# Patient Record
Sex: Male | Born: 1955
Health system: Southern US, Community
[De-identification: ages and names within clinical notes are randomized; demographics above are authoritative.]

## PROBLEM LIST (undated history)

## (undated) DIAGNOSIS — I10 Essential (primary) hypertension: Secondary | ICD-10-CM

## (undated) DIAGNOSIS — C188 Malignant neoplasm of overlapping sites of colon: Secondary | ICD-10-CM

## (undated) DIAGNOSIS — C184 Malignant neoplasm of transverse colon: Secondary | ICD-10-CM

## (undated) DIAGNOSIS — F1010 Alcohol abuse, uncomplicated: Secondary | ICD-10-CM

## (undated) DIAGNOSIS — F32A Depression, unspecified: Secondary | ICD-10-CM

## (undated) DIAGNOSIS — F329 Major depressive disorder, single episode, unspecified: Secondary | ICD-10-CM

## (undated) DIAGNOSIS — C772 Secondary and unspecified malignant neoplasm of intra-abdominal lymph nodes: Principal | ICD-10-CM

## (undated) DIAGNOSIS — R911 Solitary pulmonary nodule: Secondary | ICD-10-CM

## (undated) DIAGNOSIS — I609 Nontraumatic subarachnoid hemorrhage, unspecified: Secondary | ICD-10-CM

## (undated) DIAGNOSIS — J449 Chronic obstructive pulmonary disease, unspecified: Secondary | ICD-10-CM

## (undated) DIAGNOSIS — K219 Gastro-esophageal reflux disease without esophagitis: Secondary | ICD-10-CM

## (undated) DIAGNOSIS — S065X9A Traumatic subdural hemorrhage with loss of consciousness of unspecified duration, initial encounter: Secondary | ICD-10-CM

## (undated) DIAGNOSIS — R9439 Abnormal result of other cardiovascular function study: Secondary | ICD-10-CM

## (undated) DIAGNOSIS — K4041 Unilateral inguinal hernia, with gangrene, recurrent: Secondary | ICD-10-CM

## (undated) DIAGNOSIS — F4310 Post-traumatic stress disorder, unspecified: Secondary | ICD-10-CM

## (undated) DIAGNOSIS — S0990XA Unspecified injury of head, initial encounter: Secondary | ICD-10-CM

## (undated) DIAGNOSIS — J439 Emphysema, unspecified: Secondary | ICD-10-CM

## (undated) DIAGNOSIS — Z5189 Encounter for other specified aftercare: Secondary | ICD-10-CM

## (undated) DIAGNOSIS — F419 Anxiety disorder, unspecified: Secondary | ICD-10-CM

## (undated) DIAGNOSIS — E7800 Pure hypercholesterolemia, unspecified: Secondary | ICD-10-CM

## (undated) HISTORY — PX: OTHER SURGICAL HISTORY: SHX169

## (undated) HISTORY — DX: Major depressive disorder, single episode, unspecified: F32.9

## (undated) HISTORY — DX: Chronic obstructive pulmonary disease, unspecified: J44.9

## (undated) HISTORY — DX: Secondary and unspecified malignant neoplasm of intra-abdominal lymph nodes: C77.2

## (undated) HISTORY — DX: Nontraumatic subarachnoid hemorrhage, unspecified: I60.9

## (undated) HISTORY — DX: Unilateral inguinal hernia, with gangrene, recurrent: K40.41

## (undated) HISTORY — DX: Traumatic subdural hemorrhage with loss of consciousness of unspecified duration, initial encounter: S06.5X9A

## (undated) HISTORY — DX: Encounter for other specified aftercare: Z51.89

## (undated) HISTORY — DX: Alcohol abuse, uncomplicated: F10.10

## (undated) HISTORY — DX: Abnormal result of other cardiovascular function study: R94.39

## (undated) HISTORY — DX: Depression, unspecified: F32.A

## (undated) HISTORY — DX: Malignant neoplasm of transverse colon: C18.4

## (undated) HISTORY — DX: Unspecified injury of head, initial encounter: S09.90XA

## (undated) HISTORY — DX: Solitary pulmonary nodule: R91.1

## (undated) HISTORY — DX: Malignant neoplasm of overlapping sites of colon: C18.8

## (undated) HISTORY — PX: KIDNEY SURGERY: SHX687

## (undated) HISTORY — DX: Emphysema, unspecified: J43.9

## (undated) SURGERY — INSERTION, TUNNELED CENTRAL VENOUS DEVICE, WITH PORT
Anesthesia: Choice

---

## 1999-02-07 DIAGNOSIS — S0990XA Unspecified injury of head, initial encounter: Secondary | ICD-10-CM

## 1999-02-07 HISTORY — PX: CRANIOTOMY: SHX93

## 1999-02-07 HISTORY — DX: Unspecified injury of head, initial encounter: S09.90XA

## 2007-02-04 ENCOUNTER — Emergency Department (HOSPITAL_COMMUNITY): Admission: EM | Admit: 2007-02-04 | Discharge: 2007-02-05 | Payer: Self-pay | Admitting: Emergency Medicine

## 2007-05-02 ENCOUNTER — Emergency Department (HOSPITAL_COMMUNITY): Admission: EM | Admit: 2007-05-02 | Discharge: 2007-05-02 | Payer: Self-pay | Admitting: Emergency Medicine

## 2007-05-03 ENCOUNTER — Ambulatory Visit (HOSPITAL_COMMUNITY): Admission: RE | Admit: 2007-05-03 | Discharge: 2007-05-03 | Payer: Self-pay | Admitting: Emergency Medicine

## 2008-08-25 ENCOUNTER — Encounter (INDEPENDENT_AMBULATORY_CARE_PROVIDER_SITE_OTHER): Payer: Self-pay | Admitting: General Surgery

## 2008-08-25 ENCOUNTER — Other Ambulatory Visit: Admission: RE | Admit: 2008-08-25 | Discharge: 2008-08-25 | Payer: Self-pay | Admitting: General Surgery

## 2008-12-03 ENCOUNTER — Ambulatory Visit (HOSPITAL_COMMUNITY): Admission: RE | Admit: 2008-12-03 | Discharge: 2008-12-03 | Payer: Self-pay | Admitting: Internal Medicine

## 2008-12-29 ENCOUNTER — Encounter (INDEPENDENT_AMBULATORY_CARE_PROVIDER_SITE_OTHER): Payer: Self-pay | Admitting: *Deleted

## 2008-12-29 LAB — CONVERTED CEMR LAB
ALT: 8 units/L
AST: 12 units/L
Albumin: 4.4 g/dL
Alkaline Phosphatase: 46 units/L
Bilirubin, Direct: 0.1 mg/dL
Cholesterol: 218 mg/dL
HDL: 48 mg/dL
LDL Cholesterol: 138 mg/dL
Total Protein: 6.6 g/dL
Triglycerides: 162 mg/dL

## 2009-02-06 HISTORY — PX: COLONOSCOPY: SHX174

## 2009-02-15 ENCOUNTER — Ambulatory Visit: Payer: Self-pay | Admitting: Cardiology

## 2009-02-15 ENCOUNTER — Encounter (HOSPITAL_COMMUNITY): Admission: RE | Admit: 2009-02-15 | Discharge: 2009-03-17 | Payer: Self-pay | Admitting: Oncology

## 2009-02-25 ENCOUNTER — Encounter: Payer: Self-pay | Admitting: Cardiology

## 2009-02-25 ENCOUNTER — Ambulatory Visit: Payer: Self-pay | Admitting: Cardiology

## 2009-03-02 ENCOUNTER — Encounter (INDEPENDENT_AMBULATORY_CARE_PROVIDER_SITE_OTHER): Payer: Self-pay | Admitting: *Deleted

## 2009-03-02 ENCOUNTER — Ambulatory Visit (HOSPITAL_COMMUNITY): Admission: RE | Admit: 2009-03-02 | Discharge: 2009-03-02 | Payer: Self-pay | Admitting: Adult Health

## 2009-03-02 ENCOUNTER — Encounter: Payer: Self-pay | Admitting: Adult Health

## 2009-03-02 ENCOUNTER — Ambulatory Visit: Payer: Self-pay | Admitting: Cardiology

## 2009-03-02 DIAGNOSIS — S0990XA Unspecified injury of head, initial encounter: Secondary | ICD-10-CM | POA: Insufficient documentation

## 2009-03-02 LAB — CONVERTED CEMR LAB
BUN: 13 mg/dL
BUN: 13 mg/dL (ref 6–23)
Basophils Absolute: 0.1 10*3/uL (ref 0.0–0.1)
Basophils Relative: 1 % (ref 0–1)
CO2: 27 meq/L
CO2: 27 meq/L (ref 19–32)
Calcium: 10 mg/dL
Calcium: 10 mg/dL (ref 8.4–10.5)
Chloride: 99 meq/L
Chloride: 99 meq/L (ref 96–112)
Creatinine, Ser: 1.07 mg/dL (ref 0.40–1.50)
Creatinine, Ser: 107 mg/dL
Eosinophils Absolute: 0.3 10*3/uL (ref 0.0–0.7)
Eosinophils Relative: 3 % (ref 0–5)
Glucose, Bld: 90 mg/dL
Glucose, Bld: 90 mg/dL (ref 70–99)
HCT: 47.1 % (ref 39.0–52.0)
Hemoglobin: 15.9 g/dL (ref 13.0–17.0)
INR: 0.97
INR: 0.97 (ref <1.50)
Lymphocytes Relative: 23 % (ref 12–46)
Lymphs Abs: 2.4 10*3/uL (ref 0.7–4.0)
MCHC: 33.8 g/dL (ref 30.0–36.0)
MCV: 94 fL (ref 78.0–100.0)
Monocytes Absolute: 0.9 10*3/uL (ref 0.1–1.0)
Monocytes Relative: 9 % (ref 3–12)
Neutro Abs: 6.9 10*3/uL (ref 1.7–7.7)
Neutrophils Relative %: 65 % (ref 43–77)
Platelets: 282 10*3/uL (ref 150–400)
Potassium: 4.3 meq/L
Potassium: 4.3 meq/L (ref 3.5–5.3)
Prothrombin Time: 12.8 s
Prothrombin Time: 12.8 s (ref 11.6–15.2)
RBC: 5.01 M/uL (ref 4.22–5.81)
RDW: 12.7 % (ref 11.5–15.5)
Sodium: 139 meq/L
Sodium: 139 meq/L (ref 135–145)
WBC: 10.5 10*3/uL (ref 4.0–10.5)
aPTT: 29 s
aPTT: 29 s (ref 24–37)

## 2009-03-03 ENCOUNTER — Encounter: Payer: Self-pay | Admitting: Adult Health

## 2009-03-04 ENCOUNTER — Telehealth (INDEPENDENT_AMBULATORY_CARE_PROVIDER_SITE_OTHER): Payer: Self-pay

## 2009-03-08 ENCOUNTER — Ambulatory Visit (HOSPITAL_COMMUNITY): Admission: RE | Admit: 2009-03-08 | Discharge: 2009-03-08 | Payer: Self-pay | Admitting: Internal Medicine

## 2009-03-08 ENCOUNTER — Ambulatory Visit: Payer: Self-pay | Admitting: Internal Medicine

## 2009-03-09 ENCOUNTER — Encounter (INDEPENDENT_AMBULATORY_CARE_PROVIDER_SITE_OTHER): Payer: Self-pay

## 2009-03-11 ENCOUNTER — Encounter: Payer: Self-pay | Admitting: Cardiology

## 2009-03-11 ENCOUNTER — Ambulatory Visit (HOSPITAL_COMMUNITY): Admission: RE | Admit: 2009-03-11 | Discharge: 2009-03-11 | Payer: Self-pay | Admitting: Cardiology

## 2009-03-16 ENCOUNTER — Encounter (INDEPENDENT_AMBULATORY_CARE_PROVIDER_SITE_OTHER): Payer: Self-pay | Admitting: *Deleted

## 2009-03-25 ENCOUNTER — Ambulatory Visit: Payer: Self-pay | Admitting: Cardiology

## 2009-03-25 DIAGNOSIS — I2510 Atherosclerotic heart disease of native coronary artery without angina pectoris: Secondary | ICD-10-CM | POA: Insufficient documentation

## 2009-03-31 ENCOUNTER — Ambulatory Visit (HOSPITAL_COMMUNITY): Admission: RE | Admit: 2009-03-31 | Discharge: 2009-03-31 | Payer: Self-pay | Admitting: Cardiology

## 2009-04-02 ENCOUNTER — Encounter (INDEPENDENT_AMBULATORY_CARE_PROVIDER_SITE_OTHER): Payer: Self-pay | Admitting: *Deleted

## 2009-04-08 ENCOUNTER — Ambulatory Visit (HOSPITAL_COMMUNITY): Admission: RE | Admit: 2009-04-08 | Discharge: 2009-04-08 | Payer: Self-pay | Admitting: Internal Medicine

## 2009-04-21 ENCOUNTER — Ambulatory Visit: Payer: Self-pay | Admitting: Cardiology

## 2009-04-21 DIAGNOSIS — K4041 Unilateral inguinal hernia, with gangrene, recurrent: Secondary | ICD-10-CM | POA: Insufficient documentation

## 2009-04-21 DIAGNOSIS — E785 Hyperlipidemia, unspecified: Secondary | ICD-10-CM | POA: Insufficient documentation

## 2009-04-21 HISTORY — DX: Unilateral inguinal hernia, with gangrene, recurrent: K40.41

## 2009-04-28 ENCOUNTER — Observation Stay (HOSPITAL_COMMUNITY): Admission: RE | Admit: 2009-04-28 | Discharge: 2009-04-29 | Payer: Self-pay | Admitting: General Surgery

## 2009-05-26 ENCOUNTER — Encounter: Payer: Self-pay | Admitting: Cardiology

## 2009-06-01 LAB — CONVERTED CEMR LAB
ALT: 9 units/L (ref 0–53)
AST: 10 units/L (ref 0–37)
Albumin: 4.5 g/dL (ref 3.5–5.2)
Alkaline Phosphatase: 52 units/L (ref 39–117)
Bilirubin, Direct: 0.1 mg/dL (ref 0.0–0.3)
Cholesterol: 175 mg/dL (ref 0–200)
HDL: 41 mg/dL (ref >39)
Indirect Bilirubin: 0.3 mg/dL (ref 0.0–0.9)
LDL Cholesterol: 103 mg/dL — ABNORMAL HIGH (ref 0–99)
Total Bilirubin: 0.4 mg/dL (ref 0.3–1.2)
Total CHOL/HDL Ratio: 4.3
Total Protein: 6.9 g/dL (ref 6.0–8.3)
Triglycerides: 157 mg/dL — ABNORMAL HIGH (ref <150)
VLDL: 31 mg/dL (ref 0–40)

## 2009-07-26 ENCOUNTER — Telehealth: Payer: Self-pay | Admitting: Cardiology

## 2009-09-28 ENCOUNTER — Emergency Department (HOSPITAL_COMMUNITY): Admission: EM | Admit: 2009-09-28 | Discharge: 2009-09-28 | Payer: Self-pay | Admitting: Emergency Medicine

## 2009-11-08 ENCOUNTER — Ambulatory Visit (HOSPITAL_COMMUNITY): Admission: RE | Admit: 2009-11-08 | Discharge: 2009-11-08 | Payer: Self-pay | Admitting: Gastroenterology

## 2009-11-29 ENCOUNTER — Ambulatory Visit: Payer: Self-pay | Admitting: Cardiology

## 2009-12-20 ENCOUNTER — Encounter: Payer: Self-pay | Admitting: Gastroenterology

## 2010-01-05 ENCOUNTER — Ambulatory Visit: Payer: Self-pay | Admitting: Gastroenterology

## 2010-01-05 ENCOUNTER — Ambulatory Visit (HOSPITAL_COMMUNITY)
Admission: RE | Admit: 2010-01-05 | Discharge: 2010-01-05 | Payer: Self-pay | Source: Home / Self Care | Admitting: Gastroenterology

## 2010-02-06 HISTORY — PX: HERNIA REPAIR: SHX51

## 2010-02-17 ENCOUNTER — Ambulatory Visit (HOSPITAL_COMMUNITY)
Admission: RE | Admit: 2010-02-17 | Discharge: 2010-02-17 | Payer: Self-pay | Source: Home / Self Care | Attending: Cardiology | Admitting: Cardiology

## 2010-02-21 ENCOUNTER — Encounter: Payer: Self-pay | Admitting: Cardiology

## 2010-03-04 ENCOUNTER — Ambulatory Visit (HOSPITAL_COMMUNITY)
Admission: RE | Admit: 2010-03-04 | Discharge: 2010-03-04 | Payer: Self-pay | Source: Home / Self Care | Attending: Family Medicine | Admitting: Family Medicine

## 2010-03-08 NOTE — Progress Notes (Signed)
Summary: RX  Phone Note Call from Patient   Caller: Patient Reason for Call: Talk to Nurse Summary of Call: Pt states that pharmacy says the RX for Alprazolam has to be called in/please call it in to [facility] in [place]/tg Initial call taken by: Raechel Ache [facility],  March 04, 2009 2:48 PM  Follow-up for Phone Call        [facility]) state they will accept printed RX for Xanax. Patient advised. He states he will take RX back to [facility] and that if they do not refill it he will bring printed RX back to me and I will call in RX. Follow-up by: Larita Fife Via LPN,  March 04, 2009 4:57 PM

## 2010-03-08 NOTE — Assessment & Plan Note (Signed)
Summary: ROV   Visit Type:  Follow-up Primary Provider:  free clinic  CC:  some sob.  History of Present Illness: Erik [name] comes in today for further evaluation and management of heart disease.  He still has some swelling in the area of his diagnostic catheter site. On last visit, we performed an ultrasound which showed no vascular abnormality. There was also no hematoma.  He denies any abdominal pain, constipation, or blood in his stool.  He continues to smoke. He tried to cut back. His abnormal CT scan was reviewed again with him today.  He denies any angina or ischemic symptoms. He does have some dyspnea on exertion which is probably pulmonary. He seems to be compliant with his medications.  Current Medications (verified): 1)  Alprazolam 0.5 Mg Tabs (Alprazolam) .... Take 1 Tab Two Times A Day 2)  Simvastatin 40 Mg Tabs (Simvastatin) .... Take 1 Tab Daily 3)  Metoprolol Tartrate 25 Mg Tabs (Metoprolol Tartrate) .... Take 1 Tab Two Times A Day 4)  Omeprazole 20 Mg Cpdr (Omeprazole) .... Take 1 Tab Daily 5)  Aspirin 81 Mg Tbec (Aspirin) .... Take 1 Tab Daily  Allergies (verified): No Known Drug Allergies  Past History:  Past Medical History: Last updated: 03/02/2009 Current Problems:  HEAD TRAUMA (ICD-959.01) ALCOHOL ABUSE (ICD-305.00) DEPRESSION (ICD-311) ABNORMAL STRESS ELECTROCARDIOGRAM (ICD-794.31)  Past Surgical History: Last updated: 03/02/2009 kidney surgery 30 yrs ago  Family History: Last updated: 03/02/2009 Family History of Coronary Artery Disease:   Social History: Last updated: 03/02/2009 Alcohol Use - yes formerly heavy Beer, Whiskey Full Time Single  Tobacco Use - Yes.  Drug Use - THC in the past.  Risk Factors: Alcohol Use: 0 (03/02/2009) >5 drinks/d w/in last 3 months: no (03/02/2009)  Risk Factors: Smoking Status: current (03/02/2009) Packs/Day: 1.5 (03/02/2009)  Review of Systems       negative other than history of present  illness  Vital Signs:  Patient profile:   54 year old male Weight:      224 pounds Pulse rate:   73 / minute BP sitting:   108 / 72  (right arm)  Vitals Entered By: Dreama Saa, CNA (April 21, 2009 12:14 PM)  Physical Exam  General:  Well developed, well nourished, in no acute distress. Head:  normocephalic and atraumatic Eyes:  PERRLA/EOM intact; conjunctiva and lids normal. Neck:  Neck supple, no JVD. No masses, thyromegaly or abnormal cervical nodes. Chest Erik [name]:  no deformities or breast masses noted Lungs:  decreased breath sounds throughout Heart:  Non-displaced PMI, chest non-tender; regular rate and rhythm, S1, S2 without murmurs, rubs or gallops. Carotid upstroke normal, no bruit. Normal abdominal aortic size, no bruits. Femorals normal pulses, no bruits. Pedals normal pulses. No edema, no varicosities. Abdomen:  soft, good bowel sounds, no organomegaly. He does have a right inguinal hernia to my inspection by digital exam. Msk:  Back normal, normal gait. Muscle strength and tone normal. Pulses:  pulses normal in all 4 extremities Extremities:  No clubbing or cyanosis. Neurologic:  Alert and oriented x 3. Skin:  Intact without lesions or rashes. Psych:  Normal affect.   Impression & Recommendations:  Problem # 1:  ING HERN W/GANGREN RECUR UNILAT/UNSPEC ING HERN (ICD-550.01) Assessment New I will refer him to Dr. Malvin Johns. Orders: Misc. Referral (Misc. Ref)  Problem # 2:  CAD, NATIVE VESSEL (ICD-414.01) Assessment: Unchanged  His updated medication list for this problem includes:    Metoprolol Tartrate 25 Mg Tabs (Metoprolol tartrate) .Marland Kitchen... Take  1 tab two times a day    Aspirin 81 Mg Tbec (Aspirin) .Marland Kitchen... Take 1 tab daily  His updated medication list for this problem includes:    Metoprolol Tartrate 25 Mg Tabs (Metoprolol tartrate) .Marland Kitchen... Take 1 tab two times a day    Aspirin 81 Mg Tbec (Aspirin) .Marland Kitchen... Take 1 tab daily  Problem # 3:  R/O CHEST XRAY, ABNORMAL  (ICD-793.1) Assessment: Unchanged  Problem # 4:  TOBACCO USER (ICD-305.1) Assessment: Unchanged  Problem # 5:  HYPERLIPIDEMIA (ICD-272.4)  His updated medication list for this problem includes:    Simvastatin 40 Mg Tabs (Simvastatin) .Marland Kitchen... Take 1 tab daily  Future Orders: T-Lipid Profile ([id number]) ... 05/05/2009 T-Hepatic Function [id number]) ... 05/05/2009  His updated medication list for this problem includes:    Simvastatin 40 Mg Tabs (Simvastatin) .Marland Kitchen... Take 1 tab daily  Patient Instructions: 1)  Your physician recommends that you schedule a follow-up appointment in: 6 months 2)  Your physician recommends that you return for lab work in: 2 weeks 3)  You have been referred to Dr Malvin Johns

## 2010-03-08 NOTE — Letter (Signed)
Summary: TCS ORDER/TRIAGE  TCS ORDER/TRIAGE   Imported By: Rexene Alberts 12/20/2009 15:43:06  _____________________________________________________________________  External Attachment:    Type:   Image     Comment:   External Document

## 2010-03-08 NOTE — Letter (Signed)
Summary: Cardiac Catheterization Instructions- Main Lab  [facility]  [address] [place] [address]   Phone: [phone]  Fax: [phone]     03/02/2009 MRN: [id number]  [facility] [address] [place], [place]  [address]  Dear Mr. Morissette,   You are scheduled for Cardiac Catheterization on  03/08/2009             with Dr. Milas Kocher           .  Please arrive at the [facility] at 7:30am        on the day of your procedure.  1. DIET     ____ Nothing to eat or drink after midnight except your medications with a sip of water.  2. Have Labs drawn at [facility]. 3. MAKE SURE YOU TAKE YOUR ASPIRIN.  4. _____ DO NOT TAKE these medications before your procedure:         ________________________________________________________________________________      ____ YOU MAY TAKE ALL of your remaining medications with a small amount of water.      ____ START NEW medications:     ________________________________________________________________________________      ____ Eilene Ghazi instructions:     ________________________________________________________________________________  5. Plan for one night stay - bring personal belongings (i.e. toothpaste, toothbrush, etc.)  6. Bring a current list of your medications and current insurance cards.  7. Must have a responsible person to drive you home.   8. Someone must be with yu for the first 24 hours after you arrive home.  9. Please wear clothes that are easy to get on and off and wear slip-on shoes.  *Special note: Every effort is made to have your procedure done on time.  Occasionally there are emergencies that present themselves at the hospital that may cause delays.  Please be patient if a delay does occur.  If you have any questions after you get home, please call the office at the number listed above.  Larita Fife Via LPN

## 2010-03-08 NOTE — Miscellaneous (Signed)
Summary: LABS LIPIDS,LIVER 12/29/2008  Clinical Lists Changes  Observations: Added new observation of ALBUMIN: 4.4 g/dL (60/45/4098 1:19) Added new observation of PROTEIN, TOT: 6.6 g/dL (14/78/2956 2:13) Added new observation of SGPT (ALT): 8 units/L (12/29/2008 9:03) Added new observation of SGOT (AST): 12 units/L (12/29/2008 9:03) Added new observation of ALK PHOS: 46 units/L (12/29/2008 9:03) Added new observation of BILI DIRECT: 0.1 mg/dL (08/65/7846 9:62) Added new observation of LDL: 138 mg/dL (95/28/4132 4:40) Added new observation of HDL: 48 mg/dL (12/03/2534 6:44) Added new observation of TRIGLYC TOT: 162 mg/dL (03/47/4259 5:63) Added new observation of CHOLESTEROL: 218 mg/dL (87/56/4332 9:51)

## 2010-03-08 NOTE — Letter (Signed)
Summary: [place] Results Engineer, agricultural at [facility]  [address] [place] [address]   Phone: [phone]  Fax: [phone]      April 02, 2009 MRN: [id number]   [facility] [address] [place], [place]  [address]   Dear Mr. Brue,  Your test ordered by Selena Batten has been reviewed by your physician (or physician assistant) and was found to be normal or stable. Your physician (or physician assistant) felt no changes were needed at this time.  ____ Echocardiogram  ____ Cardiac Stress Test  ____ Lab Work  _x___ Peripheral vascular study of arms, legs or neck  ____ CT scan or X-ray  ____ Lung or Breathing test  ____ Other:   No change in medical treatment at this time, per Dr. Daleen Squibb.  Thank you, Marites Nath Allyne Gee RN    Schertz Bing, MD, Lenise Arena.C.Gaylord Shih, MD, F.A.C.C Lewayne Bunting, MD, F.A.C.C Nona Dell, MD, F.A.C.C Charlton Haws, MD, Lenise Arena.C.C

## 2010-03-08 NOTE — Assessment & Plan Note (Signed)
Summary: 6 mth f/u per checkout on 04/21/09/tg   Visit Type:  Follow-up  CC:  some sob.   Current Medications (verified): 1)  Aspirin 81 Mg Tbec (Aspirin) .... Take 1 Tab Daily 2)  Dexilant 30 Mg Cpdr (Dexlansoprazole) .... Take 1 Tab Daily 3)  Crestor 10 Mg Tabs (Rosuvastatin Calcium) .... Take 1 Tab Daily 4)  Keppra 500 Mg Tabs (Levetiracetam) .... Take 2 Tabs Daily 5)  Buspirone Hcl 5 Mg Tabs (Buspirone Hcl) .... Take 2 Tabs Daily 6)  Toprol Xl 25 Mg Xr24h-Tab (Metoprolol Succinate) .... Take 1 Tab Daily  Allergies (verified): No Known Drug Allergies  Comments:  Nurse/Medical Assistant: patient stopped simvastatin and is now on crestor 10 mg   Vital Signs:  Patient profile:   55 year old male Weight:      232 pounds BMI:     29.89 Pulse rate:   100 / minute BP sitting:   109 / 68  (right arm)  Vitals Entered By: Dreama Saa, CNA (November 29, 2009 3:46 PM)   Impression & Recommendations:  Problem # 1:  CAD, NATIVE VESSEL (ICD-414.01) Assessment Unchanged  The following medications were removed from the medication list:    Metoprolol Tartrate 25 Mg Tabs (Metoprolol tartrate) .Marland Kitchen... Take 1 tab two times a day His updated medication list for this problem includes:    Aspirin 81 Mg Tbec (Aspirin) .Marland Kitchen... Take 1 tab daily    Toprol Xl 25 Mg Xr24h-tab (Metoprolol succinate) .Marland Kitchen... Take 1 tab daily  Problem # 2:  TOBACCO USER (ICD-305.1) Assessment: Improved He Quit!  Problem # 3:  HYPERLIPIDEMIA (ICD-272.4) follow at free clinic The following medications were removed from the medication list:    Simvastatin 40 Mg Tabs (Simvastatin) .Marland Kitchen... Take 1 tab daily His updated medication list for this problem includes:    Crestor 10 Mg Tabs (Rosuvastatin calcium) .Marland Kitchen... Take 1 tab daily  Problem # 4:  R/O CHEST XRAY, ABNORMAL (ICD-793.1) He needs a followup CT scan of the chest in February 2012. This again reinforced. I will see him at that time and arrange.  Patient  Instructions: 1)  Your physician recommends that you schedule a follow-up appointment in: February, 2012 2)  Your physician recommends that you continue on your current medications as directed. Please refer to the Current Medication list given to you today.  Appended Document: [facility]     Primary Provider:  free clinic   History of Present Illness: Mr Sheerin comes in today for further evaluation and management of his nonobstructive coronary disease and multiple cardiac risk factors.  He had his inguinal hernia repaired we picked up last visit.  He's had no angina or ischemic symptoms. He quit smoking 2 weeks ago. He started breathing better his legs feel better.    Allergies: No Known Drug Allergies  Past History:  Past Medical History: Last updated: 03/02/2009 Current Problems:  HEAD TRAUMA (ICD-959.01) ALCOHOL ABUSE (ICD-305.00) DEPRESSION (ICD-311) ABNORMAL STRESS ELECTROCARDIOGRAM (ICD-794.31)  Past Surgical History: Last updated: 03/02/2009 kidney surgery 30 yrs ago  Family History: Last updated: 03/02/2009 Family History of Coronary Artery Disease:   Social History: Last updated: 03/02/2009 Alcohol Use - yes formerly heavy Beer, Whiskey Full Time Single  Tobacco Use - Yes.  Drug Use - THC in the past.  Risk Factors: Alcohol Use: 0 (03/02/2009) >5 drinks/d w/in last 3 months: no (03/02/2009)  Risk Factors: Smoking Status: current (03/02/2009) Packs/Day: 1.5 (03/02/2009)  Review of Systems  negative other than history of present illness  Physical Exam  General:  Well developed, well nourished, in no acute distress. Head:  normocephalic and atraumatic Neck:  Neck supple, no JVD. No masses, thyromegaly or abnormal cervical nodes. Lungs:  decreased breath sounds throughout with no rhonchi wheezes Heart:  PMI nondisplaced, soft S1-S2, no doubt. Carotid upstrokes equal bilaterally without bruit Abdomen:  Bowel sounds positive; abdomen  soft and non-tender without masses, organomegaly, or hernias noted. No hepatosplenomegaly. Msk:  Back normal, normal gait. Muscle strength and tone normal. Pulses:  pulses normal in all 4 extremities Extremities:  No clubbing or cyanosis. Neurologic:  Alert and oriented x 3. Skin:  Intact without lesions or rashes. Psych:  Normal affect.   Impression & Recommendations:  Problem # 1:  CAD, NATIVE VESSEL (ICD-414.01) Assessment Unchanged  His updated medication list for this problem includes:    Aspirin 81 Mg Tbec (Aspirin) .Marland Kitchen... Take 1 tab daily    Toprol Xl 25 Mg Xr24h-tab (Metoprolol succinate) .Marland Kitchen... Take 1 tab daily

## 2010-03-08 NOTE — Progress Notes (Signed)
Summary: rx refill  Phone Note Call from Patient Call back at Home Phone [phone]   Caller: pt walk in Reason for Call: Refill Medication Summary of Call: pt would like his alprazolam 0.5mg  [facility]. Initial call taken by: Faythe Ghee,  July 26, 2009 12:19 PM  Follow-up for Phone Call        [facility]. Pt. needs to refer to his PCP. Follow-up by: Larita Fife Via LPN,  July 26, 2009 1:10 PM  Additional Follow-up for Phone Call Additional follow up Details #1::        Pt. was seen by Ricardo Jericho on 1-25-11and she gave him a one time only RX for Alprazolam 0.5mg  by mouth two times a day #60 with no refills. Pt. came into office and stated that the free clinic sent him to this office because they cannot refill controlled RX's. Pt. does not have PMD. Please advise. Additional Follow-up by: Larita Fife Via LPN,  July 26, 2009 2:04 PM    Additional Follow-up for Phone Call Additional follow up Details #2::     Reviewed Juanito Doom, MD  Follow-up by: Gaylord Shih, MD, [facility],  July 29, 2009 7:18 AM

## 2010-03-08 NOTE — Miscellaneous (Signed)
Summary: Orders Update  Clinical Lists Changes  Orders: Added new Test order of CT with Contrast (CT w/ contrast) - Signed 

## 2010-03-08 NOTE — Assessment & Plan Note (Signed)
Summary: per Dr.Kealii Thueson/f/u echo done on 1/20/tg   Visit Type:  Follow-up Primary Provider:  free clinic  CC:  no complaints today.  History of Present Illness: Mr. Statz returns today for followup for an abnormal stress test, chest pain, and a cardiac catheter that showed nonobstructive disease.  He still has some occasional chest tightness which is nonexertional. He started smoking again. He's had an abnormal chest x-ray which showed a question of a hilar mass. CT Scan demonstrated no mass but did show some small right upper lobe nodules. Followup CT recommended a year.  He's been having a little bit of discomfort and swelling in his right groin. He has some ecchymoses arisen but this is resolved. He denies any foot pain or claudication.  Current Medications (verified): 1)  Alprazolam 0.5 Mg Tabs (Alprazolam) .... Take 1 Tab Two Times A Day 2)  Simvastatin 40 Mg Tabs (Simvastatin) .... Take 1 Tab Daily 3)  Metoprolol Tartrate 25 Mg Tabs (Metoprolol Tartrate) .... Take 1 Tab Two Times A Day 4)  Omeprazole 20 Mg Cpdr (Omeprazole) .... Take 1 Tab Daily 5)  Aspirin 81 Mg Tbec (Aspirin) .... Take 1 Tab Daily 6)  Halobetasol Propionate 0.05 % Crea (Halobetasol Propionate) .... Apply Every Other Day  Allergies (verified): No Known Drug Allergies  Past History:  Past Medical History: Last updated: 03/02/2009 Current Problems:  HEAD TRAUMA (ICD-959.01) ALCOHOL ABUSE (ICD-305.00) DEPRESSION (ICD-311) ABNORMAL STRESS ELECTROCARDIOGRAM (ICD-794.31)  Past Surgical History: Last updated: 03/02/2009 kidney surgery 30 yrs ago  Family History: Last updated: 03/02/2009 Family History of Coronary Artery Disease:   Social History: Last updated: 03/02/2009 Alcohol Use - yes formerly heavy Beer, Whiskey Full Time Single  Tobacco Use - Yes.  Drug Use - THC in the past.  Risk Factors: Alcohol Use: 0 (03/02/2009) >5 drinks/d w/in last 3 months: no (03/02/2009)  Risk Factors: Smoking  Status: current (03/02/2009) Packs/Day: 1.5 (03/02/2009)  Review of Systems       negative other than history of present illness  Vital Signs:  Patient profile:   55 year old male Weight:      230 pounds Pulse rate:   86 / minute BP sitting:   134 / 83  (right arm)  Vitals Entered By: Dreama Saa, CNA (March 25, 2009 1:07 PM)  Physical Exam  General:  Well developed, well nourished, in no acute distress. Head:  normocephalic and atraumatic Eyes:  PERRLA/EOM intact; conjunctiva and lids normal. Neck:  Neck supple, no JVD. No masses, thyromegaly or abnormal cervical nodes. Chest Khambrel Amsden:  no deformities or breast masses noted Lungs:  Clear bilaterally to auscultation and percussion. Heart:  Non-displaced PMI, chest non-tender; regular rate and rhythm, S1, S2 without murmurs, rubs or gallops. Carotid upstroke normal, no bruit. Normal abdominal aortic size, no bruits. Femorals normal pulses, no bruits. Pedals normal pulses. No edema, no varicosities. Abdomen:  Bowel sounds positive; abdomen soft and non-tender without masses, organomegaly, or hernias noted. No hepatosplenomegaly. Msk:  Back normal, normal gait. Muscle strength and tone normal. Pulses:  pulses normal in all 4 extremities Extremities:  right groin with a systolic sound. Femoral pulses normal. A soft area of swelling along the pubis ramus. Neurologic:  Alert and oriented x 3. Skin:  Intact without lesions or rashes. Psych:  Normal affect.   Problems:  Medical Problems Added: 1)  Dx of Tobacco User  (ICD-305.1) 2)  Dx of Cad, Native Vessel  (ICD-414.01) 3)  R/o Av [place]  (ICD-447.8)  Impression & Recommendations:  Problem # 1:  CAD, NATIVE VESSEL (ICD-414.01) Assessment Unchanged  His updated medication list for this problem includes:    Metoprolol Tartrate 25 Mg Tabs (Metoprolol tartrate) .Marland Kitchen... Take 1 tab two times a day    Aspirin 81 Mg Tbec (Aspirin) .Marland Kitchen... Take 1 tab daily  His updated medication list  for this problem includes:    Metoprolol Tartrate 25 Mg Tabs (Metoprolol tartrate) .Marland Kitchen... Take 1 tab two times a day    Aspirin 81 Mg Tbec (Aspirin) .Marland Kitchen... Take 1 tab daily  Problem # 2:  R/O AV FISTULA (ICD-447.8) Assessment: New  Orders: Ultrasound (Ultrasound)  Problem # 3:  HYPERCHOLESTEROLEMIA  IIA (ICD-272.0) Assessment: Unchanged  His updated medication list for this problem includes:    Simvastatin 40 Mg Tabs (Simvastatin) .Marland Kitchen... Take 1 tab daily  His updated medication list for this problem includes:    Simvastatin 40 Mg Tabs (Simvastatin) .Marland Kitchen... Take 1 tab daily  Problem # 4:  ABNORMAL CV (STRESS) TEST (ICD-794.39) Assessment: Unchanged  Problem # 5:  R/O CHEST XRAY, ABNORMAL (ICD-793.1) Assessment: New CT scan of the chest showed tiny right upper lobe nodules but no hilar or significant adenopathy or mass. This is been reviewed with the patient. We will repeat a CT scan in a year per radiology recommendations.  Problem # 6:  TOBACCO USER (ICD-305.1) Assessment: Unchanged advised to quit. Followup CT scan in a year  Patient Instructions: 1)  Your physician recommends that you schedule a follow-up appointment in: as needed  2)  Your doctor reccommends you have [place] of right groin.

## 2010-03-08 NOTE — Miscellaneous (Signed)
Summary: LABS PT,INR,PTT,BMP,03/02/2009  Clinical Lists Changes  Observations: Added new observation of CALCIUM: 10.0 mg/dL (04/54/0981 19:14) Added new observation of CREATININE: 107 mg/dL (78/29/5621 30:86) Added new observation of BUN: 13 mg/dL (57/84/6962 95:28) Added new observation of BG RANDOM: 90 mg/dL (41/32/4401 02:72) Added new observation of CO2 PLSM/SER: 27 meq/L (03/02/2009 10:10) Added new observation of CL SERUM: 99 meq/L (03/02/2009 10:10) Added new observation of K SERUM: 4.3 meq/L (03/02/2009 10:10) Added new observation of NA: 139 meq/L (03/02/2009 10:10) Added new observation of INR: 0.97  (03/02/2009 10:10) Added new observation of PT PATIENT: 12.8 s (03/02/2009 10:10) Added new observation of PTT PATIENT: 29 s (03/02/2009 10:10)

## 2010-03-08 NOTE — Letter (Signed)
Summary: [place] [facility] Work Engineer, agricultural at [employer]  [address] [place] [address]   Phone: [phone]  Fax: [phone]     May 26, 2009 MRN: [id number]   Erik [name] [address] [place], [place]  [address]      YOUR LAB WORK IS DUE  May 31, 2009 _________________________________________  Please go to [facility], located across the street from [facility] on the second floor.  Hours are Monday - Friday 7am until 7:30pm         Saturday 8am until 12noon    _X_  DO NOT EAT OR DRINK AFTER MIDNIGHT EVENING PRIOR TO LABWORK  __ YOUR LABWORK IS NOT FASTING --YOU MAY EAT PRIOR TO LABWORK

## 2010-03-08 NOTE — Letter (Signed)
Summary: triage order  triage order   Imported By: Rosine Beat 12/20/2009 12:51:50  _____________________________________________________________________  External Attachment:    Type:   Image     Comment:   External Document  Appended Document: triage order TRILYTE PREP.  Appended Document: triage order Gave instructions to Erskine Squibb and she mailed instructions.

## 2010-03-08 NOTE — Assessment & Plan Note (Signed)
Summary: ROV-PRE CATH   Visit Type:  Follow-up Primary Provider:  free clinic  CC:  chest pain.  History of Present Illness: Erik [name] is a 55 y/o CM who was originally seen by Dr. Daleen Squibb at the [facility] in [place].  He was complaining of upper abdominal tightness with multiple CVRFs.  As a result of this, a stress myoview was completed and was found to be abnormal.  Revealed :   Acquisition notable for mild diaphragmaticattenuation.  GI activity was identified partially overlapping theinferior wall, more prominent during the resting portion of thestudy.  Left ventricular size was at the upper limit of normal.  Ontomographic images reconstructed in standard planes, there was asmall defect of moderate to marked intensity involving the basilarand mid inferior wall and extending both medially and laterally laterally.  This was essentially a fixed defect, but there wasmodest reversibility at the lateral margin.  The gatedreconstruction demonstrated  normal regional and global LV systolic function with decreased systolic accentuation of activity in themid and distal inferior wall.  Dr. Daleen Squibb requested patient be seen for discuusion of cardiac cath.  The patient continues to have intermittant chest,upper abdominal discomfort with and without exertion.  He continues to smoke, but ha stopped drinking for about 1 month.  He is accompained by his boss/friend who assists him with transportation.Marland Kitchen He is mildly anxious about results of stress test.  Preventive Screening-Counseling & Management  Alcohol-Tobacco     Alcohol drinks/day: 0     >5/day in last 3 mos: no     Smoking Status: current     Smoking Cessation Counseling: yes     Smoke Cessation Stage: contemplative     Packs/Day: 1.5     Pack years: 40pk yr      Drug Use:  yes.    Problems Prior to Update: 1)  Unspecified Pre-operative Examination  (ICD-V72.84) 2)  Head Trauma  (ICD-959.01) 3)  Alcohol Abuse  (ICD-305.00) 4)  Depression   (ICD-311) 5)  Abnormal Stress Electrocardiogram  (ICD-794.31)  Current Medications (verified): 1)  Alprazolam 0.5 Mg Tabs (Alprazolam) .... Take 1 Tab Two Times A Day 2)  Simvastatin 40 Mg Tabs (Simvastatin) .... Take 1 Tab Daily 3)  Metoprolol Tartrate 25 Mg Tabs (Metoprolol Tartrate) .... Take 1 Tab Two Times A Day 4)  Tramadol Hcl 50 Mg Tabs (Tramadol Hcl) .... Take As Needed For Pain 5)  Omeprazole 20 Mg Cpdr (Omeprazole) .... Take 1 Tab Daily 6)  Aspirin 81 Mg Tbec (Aspirin) .... Take 1 Tab Daily 7)  Halobetasol Propionate 0.05 % Crea (Halobetasol Propionate) .... Apply Every Other Day  Allergies (verified): No Known Drug Allergies  Family History: Family History of Coronary Artery Disease:   Social History: Alcohol Use - yes formerly heavy Beer, Whiskey Full Time Single  Tobacco Use - Yes.  Drug Use - THC in the past. Alcohol drinks/day:  0 >5/day in last 3 mos:  no Smoking Status:  current Packs/Day:  1.5 Pack years:  40pk yr Drug Use:  yes  Review of Systems       anxiety.  All other systems have been reviewed and are negative unless stated above.   Vital Signs:  Patient profile:   55 year old male Height:      74 inches Weight:      226 pounds BMI:     29.12 Pulse rate:   85 / minute BP sitting:   138 / 85  (right arm)  Vitals Entered By: Andrey Campanile  Neugent, CNA (March 02, 2009 3:19 PM)  Physical Exam  General:  Well developed, well nourished, in no acute distress. Head:  normocephalic and atraumatic Eyes:  PERRLA/EOM intact; conjunctiva and lids normal.Glasses Ears:  TM's intact and clear with normal canals and hearing Nose:  no deformity, discharge, inflammation, or lesions Mouth:  Teeth, gums and palate normal. Oral mucosa normal. Chest Wall:  no deformities or breast masses noted Lungs:  Bilateral mild wheezes,  Smells of cigarrettes. Heart:  Non-displaced PMI, chest non-tender; regular rate and rhythm, S1, S2 without murmurs, rubs or gallops.  Carotid upstroke normal, no bruit. Normal abdominal aortic size, no bruits. Femorals normal pulses, no bruits. Pedals normal pulses. No edema, no varicosities. Abdomen:  Obese NT 2+ Bowel sounds. Msk:  Back normal, normal gait. Muscle strength and tone normal. Pulses:  pulses normal in all 4 extremities Extremities:  No clubbing or cyanosis. Neurologic:  Alert and oriented x 3. Skin:  Intact without lesions or rashes. Psych:  anxious.     EKG  Procedure date:  03/02/2009  Findings:      Normal sinus rhythm with rate of: 89bpm  Left axis deviation.  Right bundle branch block.    Impression & Recommendations:  Problem # 1:  ABNORMAL CV (STRESS) TEST (ICD-794.39) Lengthy discussion with patient and friend concerning results of stress test and need to proceed with cardiac catherization.  Risks, and benefits also discussed.  Procedure discussed.  He is willing to proceed.  Plan cath on 03/08/09 at [facility].  Problem # 2:  HYPERCHOLESTEROLEMIA  IIA (ICD-272.0) Contimue statin as Rx from [facility] with follow-up labs His updated medication list for this problem includes:    Simvastatin 40 Mg Tabs (Simvastatin) .Marland Kitchen... Take 1 tab daily  Other Orders: T-Chest x-ray, 2 views ([id number]) Cardiac Catheterization (Cardiac Cath) T-Basic Metabolic Panel [id number]) T-CBC w/Diff [id number]) T-Protime, Auto ([id number]) T-PTT [phone])  Patient Instructions: 1)  Your physician recommends that you schedule a follow-up appointment in: after cath 2)  Your physician recommends that you return for lab work [id number] be done by Thursday 3)  Your physician has requested that you have a cardiac catheterization.  Cardiac catheterization is used to diagnose and/or treat various heart conditions. Doctors may recommend this procedure for a number of different reasons. The most common reason is to evaluate chest pain. Chest pain can be a symptom of coronary artery disease (CAD), and cardiac  catheterization can show whether plaque is narrowing or blocking your heart's arteries. This procedure is also used to evaluate the valves, as well as measure the blood flow and oxygen levels in different parts of your heart.  For further information please visit [email or web address]  Please follow instruction sheet, as given. 4)  A chest x-ray takes a picture of the organs and structures inside the chest, including the heart, lungs, and blood vessels. This test can show several things, including, whether the heart is enlarged; whether fluid is building up in the lungs; and whether pacemaker / defibrillator leads are still in place. Prescriptions: ALPRAZOLAM 0.5 MG TABS (ALPRAZOLAM) take 1 tab two times a day  #60 x 0   Entered by:   Larita Fife Via LPN   Authorized by:   Joni Reining, NP   Signed by:   Larita Fife Via LPN on 27/25/3664   Method used:   Print then Give to Patient   RxID:   [id number]

## 2010-03-08 NOTE — Miscellaneous (Signed)
**Note De-Identified Joell Buerger Obfuscation** Summary: Orders Update: Non contrast CT of chest  Clinical Lists Changes  Orders: Added new Test order of CT without Contrast (CT w/o contrast) - Signed

## 2010-03-10 NOTE — Letter (Signed)
Summary: [place] Results Engineer, agricultural at [facility]  [address], [place] [address]   Phone: [phone]  Fax: [phone]      February 21, 2010 MRN: [id number]   Erik [name] [address] [facility] [place], [place]  [address]   Dear Mr. Stanaland,  Your test ordered by Selena Batten has been reviewed by your physician (or physician assistant) and was found to be normal or stable. Your physician (or physician assistant) felt no changes were needed at this time.  ____ Echocardiogram  ____ Cardiac Stress Test  ____ Lab Work  ____ Peripheral vascular study of arms, legs or neck  __X__ CT scan or X-ray  ____ Lung or Breathing test  ____ Other:  Please continue on current medical treatment.  Thank you.   Valera Castle, MD, F.A.C.C

## 2010-03-21 ENCOUNTER — Encounter: Payer: Self-pay | Admitting: Gastroenterology

## 2010-03-30 NOTE — Letter (Signed)
Summary: MEDICAL RECORD RELEASE  MEDICAL RECORD RELEASE   Imported By: Rexene Alberts 03/21/2010 13:45:20  _____________________________________________________________________  External Attachment:    Type:   Image     Comment:   External Document

## 2010-04-01 ENCOUNTER — Ambulatory Visit (INDEPENDENT_AMBULATORY_CARE_PROVIDER_SITE_OTHER): Payer: Self-pay | Admitting: Urology

## 2010-04-01 DIAGNOSIS — N500 Atrophy of testis: Secondary | ICD-10-CM

## 2010-04-01 DIAGNOSIS — R350 Frequency of micturition: Secondary | ICD-10-CM

## 2010-04-01 DIAGNOSIS — N509 Disorder of male genital organs, unspecified: Secondary | ICD-10-CM

## 2010-04-01 DIAGNOSIS — N529 Male erectile dysfunction, unspecified: Secondary | ICD-10-CM

## 2010-04-15 ENCOUNTER — Ambulatory Visit (INDEPENDENT_AMBULATORY_CARE_PROVIDER_SITE_OTHER): Payer: Self-pay | Admitting: Cardiology

## 2010-04-15 ENCOUNTER — Encounter: Payer: Self-pay | Admitting: Adult Health

## 2010-04-15 ENCOUNTER — Encounter: Payer: Self-pay | Admitting: Cardiology

## 2010-04-15 ENCOUNTER — Encounter (INDEPENDENT_AMBULATORY_CARE_PROVIDER_SITE_OTHER): Payer: Self-pay | Admitting: *Deleted

## 2010-04-15 DIAGNOSIS — E7800 Pure hypercholesterolemia, unspecified: Secondary | ICD-10-CM

## 2010-04-15 DIAGNOSIS — I2510 Atherosclerotic heart disease of native coronary artery without angina pectoris: Secondary | ICD-10-CM

## 2010-04-19 ENCOUNTER — Encounter: Payer: Self-pay | Admitting: Cardiology

## 2010-04-19 NOTE — Letter (Signed)
Summary: [place] [employer] [facility] Work Engineer, agricultural at [employer]  [address] [place] [address]   Phone: [phone]  Fax: [phone]     April 15, 2010 MRN: [id number]   Erik [name] [address] [place], [place]  [address]      YOUR LAB WORK IS DUE   MONDAY  April 18, 2010  Please go to [facility], located across the street from [facility] on the second floor.  Hours are Monday - Friday 7am until 7:30pm         Saturday 8am until 12noon    _X_  DO NOT EAT OR DRINK AFTER MIDNIGHT EVENING PRIOR TO LABWORK

## 2010-04-19 NOTE — Assessment & Plan Note (Signed)
Summary: 1 yr f/u per ckout 03/25/09, needs f/u CT scan prior to appt t...   Visit Type:  Follow-up Primary Provider:  free clinic  CC:  1 yr fu no cardiology complaints.  History of Present Illness: [name] is a 55 y/o CM we are following for nonobstructive CAD per cath in Jan 2011.  This was completed secondary to abnormal stress test in which he was unable to complete because of severe DOE.  He has had a CT of the chest in in Jan 2012 demonstrating mild emphysematous changes in teh upper lobes compatible with COPD.  He unfortunately has gone back to smoking after stopping for a few months.  He is up to 1/2ppd.  He continues to have some complaints of DOE, but no chest pain, dizziness or syncope. He has been having some problems with groin pain in the setting of inguinal hernia repair and is being followed by a urologist regularly.  Otherwise he is doing well.  Current Medications (verified): 1)  Aspirin 81 Mg Tbec (Aspirin) .... Take 1 Tab Daily 2)  Dexilant 30 Mg Cpdr (Dexlansoprazole) .... Take 1 Tab Daily 3)  Crestor 10 Mg Tabs (Rosuvastatin Calcium) .... Take 1 Tab Daily 4)  Buspirone Hcl 5 Mg Tabs (Buspirone Hcl) .... Take 2 Tabs Daily 5)  Toprol Xl 25 Mg Xr24h-Tab (Metoprolol Succinate) .... Take 1 Tab Daily  Allergies (verified): No Known Drug Allergies  Comments:  Nurse/Medical Assistant: patient brought med list we reviewed pharmacy is Estate agent  Past History:  Past medical, surgical, family and social histories (including risk factors) reviewed, and no changes noted (except as noted below).  Past Medical History: Reviewed history from 03/02/2009 and no changes required. Current Problems:  HEAD TRAUMA (ICD-959.01) ALCOHOL ABUSE (ICD-305.00) DEPRESSION (ICD-311) ABNORMAL STRESS ELECTROCARDIOGRAM (ICD-794.31)  Past Surgical History: Reviewed history from 03/02/2009 and no changes required. kidney surgery 30 yrs ago  Family History: Reviewed history  from 03/02/2009 and no changes required. Family History of Coronary Artery Disease:   Social History: Reviewed history from 03/02/2009 and no changes required. Alcohol Use - yes formerly heavy Beer, Whiskey Full Time Single  Tobacco Use - Yes.  Drug Use - THC in the past.  Review of Systems       All other systems have been reviewed and are negative unless stated above.   Vital Signs:  Patient profile:   55 year old male Weight:      230 pounds BMI:     29.64 Pulse rate:   79 / minute BP sitting:   128 / 83  (left arm)  Vitals Entered By: Dreama Saa, CNA (April 15, 2010 9:17 AM)  Physical Exam  General:  Well developed, well nourished, in no acute distress. Lungs:  Mild bibasilar wheezes  otherwise CTA Heart:  Non-displaced PMI, chest non-tender; regular rate and rhythm, S1, S2 without murmurs, rubs or gallops. Carotid upstroke normal, no bruit. Normal abdominal aortic size, no bruits. Femorals normal pulses, no bruits. Pedals normal pulses. No edema, no varicosities. Abdomen:  Bowel sounds positive; abdomen soft and non-tender without masses, organomegaly, or hernias noted. No hepatosplenomegaly. Msk:  Back normal, normal gait. Muscle strength and tone normal. Pulses:  pulses normal in all 4 extremities Extremities:  No clubbing or cyanosis. Neurologic:  Alert and oriented x 3. Psych:  Normal affect.   EKG  Procedure date:  04/15/2010  Findings:      Normal sinus rhythm with rate of: 76 bpm. First degree AV-Block  noted.  Right bundle branch block incomplete.    Impression & Recommendations:  Problem # 1:  CAD, NATIVE VESSEL (ICD-414.01) He is without complaint at present from cardiac standpoint. He unfortunatley has gone back to smoking and is having a difficult time staying off them.  I have advised him that even though he has nonobstructive disease, this will progress if he continues to smoke.  He verbalizes understanding.   His updated medication list for this  problem includes:    Aspirin 81 Mg Tbec (Aspirin) .Marland Kitchen... Take 1 tab daily    Toprol Xl 25 Mg Xr24h-tab (Metoprolol succinate) .Marland Kitchen... Take 1 tab daily  Problem # 2:  HYPERLIPIDEMIA (ICD-272.4) He has not been checked since April of 2011. Will recheck status.  He is compliant with medications.  His updated medication list for this problem includes:    Crestor 10 Mg Tabs (Rosuvastatin calcium) .Marland Kitchen... Take 1 tab daily  Appended Document: 1 yr f/u per ckout 03/25/09, needs f/u CT scan prior to appt t...    Clinical Lists Changes  Orders: Added new Test order of T-Lipid Profile [id number]) - Signed Added new Test order of T-Hepatic Function [phone]) - Signed      Appended Document: 1 yr f/u per ckout 03/25/09, needs f/u CT scan prior to appt t...  Reviewed Erik Doom, MD

## 2010-04-20 LAB — CONVERTED CEMR LAB
AST: 11 units/L (ref 0–37)
Albumin: 4.4 g/dL (ref 3.5–5.2)
Alkaline Phosphatase: 55 units/L (ref 39–117)
Bilirubin, Direct: 0.1 mg/dL (ref 0.0–0.3)
Cholesterol: 200 mg/dL (ref 0–200)
HDL: 45 mg/dL (ref >39)
LDL Cholesterol: 132 mg/dL — ABNORMAL HIGH (ref 0–99)
Total Bilirubin: 0.4 mg/dL (ref 0.3–1.2)
Total CHOL/HDL Ratio: 4.4
Total Protein: 6.9 g/dL (ref 6.0–8.3)

## 2010-04-22 LAB — CBC
HCT: 42.1 % (ref 39.0–52.0)
Hemoglobin: 14.4 g/dL (ref 13.0–17.0)
MCH: 32.2 pg (ref 26.0–34.0)
MCHC: 34.2 g/dL (ref 30.0–36.0)
MCV: 94.2 fL (ref 78.0–100.0)
Platelets: 243 10*3/uL (ref 150–400)
RBC: 4.47 MIL/uL (ref 4.22–5.81)
RDW: 13.6 % (ref 11.5–15.5)
WBC: 7.6 10*3/uL (ref 4.0–10.5)

## 2010-04-22 LAB — DIFFERENTIAL
Basophils Absolute: 0 10*3/uL (ref 0.0–0.1)
Basophils Relative: 1 % (ref 0–1)
Eosinophils Absolute: 0.3 10*3/uL (ref 0.0–0.7)
Eosinophils Relative: 4 % (ref 0–5)
Lymphocytes Relative: 23 % (ref 12–46)
Lymphs Abs: 1.7 10*3/uL (ref 0.7–4.0)
Monocytes Absolute: 0.5 10*3/uL (ref 0.1–1.0)
Monocytes Relative: 7 % (ref 3–12)
Neutro Abs: 5 10*3/uL (ref 1.7–7.7)
Neutrophils Relative %: 66 % (ref 43–77)

## 2010-04-22 LAB — COMPREHENSIVE METABOLIC PANEL
ALT: 11 U/L (ref 0–53)
AST: 16 U/L (ref 0–37)
Albumin: 4.1 g/dL (ref 3.5–5.2)
Alkaline Phosphatase: 48 U/L (ref 39–117)
BUN: 12 mg/dL (ref 6–23)
CO2: 29 mEq/L (ref 19–32)
Calcium: 9.1 mg/dL (ref 8.4–10.5)
Chloride: 103 mEq/L (ref 96–112)
Creatinine, Ser: 0.91 mg/dL (ref 0.4–1.5)
GFR calc Af Amer: >60 mL/min (ref >60)
GFR calc non Af Amer: >60 mL/min (ref >60)
Glucose, Bld: 99 mg/dL (ref 70–99)
Potassium: 4 mEq/L (ref 3.5–5.1)
Sodium: 138 mEq/L (ref 135–145)
Total Bilirubin: 0.5 mg/dL (ref 0.3–1.2)
Total Protein: 7 g/dL (ref 6.0–8.3)

## 2010-04-22 LAB — URINALYSIS, ROUTINE W REFLEX MICROSCOPIC
Bilirubin Urine: NEGATIVE
Glucose, UA: NEGATIVE mg/dL
Ketones, ur: 15 mg/dL — AB
Leukocytes, UA: NEGATIVE
Nitrite: NEGATIVE
Protein, ur: NEGATIVE mg/dL
Specific Gravity, Urine: 1.02 (ref 1.005–1.030)
Urobilinogen, UA: 0.2 mg/dL (ref 0.0–1.0)
pH: 6.5 (ref 5.0–8.0)

## 2010-04-22 LAB — URINE MICROSCOPIC-ADD ON

## 2010-04-25 LAB — DIFFERENTIAL
Basophils Absolute: 0 10*3/uL (ref 0.0–0.1)
Basophils Relative: 0 % (ref 0–1)
Eosinophils Absolute: 0.3 10*3/uL (ref 0.0–0.7)
Eosinophils Relative: 3 % (ref 0–5)
Lymphocytes Relative: 18 % (ref 12–46)
Lymphs Abs: 1.6 10*3/uL (ref 0.7–4.0)
Monocytes Absolute: 0.7 10*3/uL (ref 0.1–1.0)
Monocytes Relative: 7 % (ref 3–12)
Neutro Abs: 6.3 10*3/uL (ref 1.7–7.7)
Neutrophils Relative %: 71 % (ref 43–77)

## 2010-04-25 LAB — CBC
HCT: 41.9 % (ref 39.0–52.0)
Hemoglobin: 14.5 g/dL (ref 13.0–17.0)
MCHC: 34.6 g/dL (ref 30.0–36.0)
MCV: 95.5 fL (ref 78.0–100.0)
Platelets: 198 10*3/uL (ref 150–400)
RBC: 4.38 MIL/uL (ref 4.22–5.81)
RDW: 12.6 % (ref 11.5–15.5)
WBC: 8.9 10*3/uL (ref 4.0–10.5)

## 2010-04-27 LAB — CREATININE, SERUM
Creatinine, Ser: 0.95 mg/dL (ref 0.4–1.5)
GFR calc non Af Amer: >60 mL/min (ref >60)

## 2010-05-01 LAB — CBC
MCV: 92.6 fL (ref 78.0–100.0)
Platelets: 216 10*3/uL (ref 150–400)
RDW: 13.9 % (ref 11.5–15.5)
WBC: 6.9 10*3/uL (ref 4.0–10.5)

## 2010-05-01 LAB — BASIC METABOLIC PANEL
BUN: 8 mg/dL (ref 6–23)
Chloride: 103 mEq/L (ref 96–112)
Creatinine, Ser: 0.92 mg/dL (ref 0.4–1.5)
Glucose, Bld: 111 mg/dL — ABNORMAL HIGH (ref 70–99)

## 2010-06-21 NOTE — Op Note (Signed)
Erik [name], MATTIE NO.:  [id number]   MEDICAL RECORD NO.:  [id number]          PATIENT TYPE:  AMB   LOCATION:  DAY                           FACILITY:  [facility]   PHYSICIAN:  Barbaraann Barthel, M.D. DATE OF BIRTH:  04/08/1955   DATE OF PROCEDURE:  DATE OF DISCHARGE:                               OPERATIVE REPORT   NOTE:  Surgery was asked to see this white male, born on December 02, 1955, for a mass on his left upper arm.  Clinically this appeared to be  a lipoma.  He chronicles that this has grown over the last 3 years  beginning as a small almost pea-sized mass that had grown to the size of  almost of a tangerine.  Surgery was consulted.   On clinical examination, there was no axillary adenopathy or satellite  lesions.  This appeared to be a movable lipoma just below the deltoid  muscle in the mid lateral aspect of the left upper arm.   TECHNIQUE:  The patient was prepped with Betadine solution and draped in  usual manner.  I used 1% Xylocaine without epinephrine to anesthetize an  area that was marked through the middle of the mass.  We then incised  this and then I dissected a large lipoma and removed this in total from  the fascia of the muscles of the forearm.  This was at least 7-10 cm in  diameter.  This was removed with careful dissection and after checking  for hemostasis, which we controlled with a cautery device, I then  irrigated the wound and closed it with 4-0 nylon sutures.  No drain was  placed.  A compressive dressing was placed.  I anticipate a seroma when  removing a lesion this large, but I did not leave a drain in place.  We  put a compressive dressing in place and we will follow him in the  morning for dressing change.  We will send this off to the Pathology  Department in formalin for pathology.   I am happy to be of service to the [facility].       Barbaraann Barthel, M.D.  Electronically Signed     WB/MEDQ  D:  08/25/2008  T:   08/26/2008  Job:  [id number]   cc:   [facility]   Pathology Department

## 2010-08-08 ENCOUNTER — Emergency Department (HOSPITAL_COMMUNITY)
Admission: EM | Admit: 2010-08-08 | Discharge: 2010-08-08 | Disposition: A | Discharge status: Home / Self Care | Payer: Self-pay | Attending: Emergency Medicine | Admitting: Emergency Medicine

## 2010-08-08 ENCOUNTER — Emergency Department (HOSPITAL_COMMUNITY): Payer: Self-pay

## 2010-08-08 DIAGNOSIS — Z87828 Personal history of other (healed) physical injury and trauma: Secondary | ICD-10-CM | POA: Insufficient documentation

## 2010-08-08 DIAGNOSIS — Z79899 Other long term (current) drug therapy: Secondary | ICD-10-CM | POA: Insufficient documentation

## 2010-08-08 DIAGNOSIS — R51 Headache: Secondary | ICD-10-CM | POA: Insufficient documentation

## 2010-08-08 LAB — DIFFERENTIAL
Basophils Absolute: 0 10*3/uL (ref 0.0–0.1)
Basophils Relative: 1 % (ref 0–1)
Eosinophils Absolute: 0.2 10*3/uL (ref 0.0–0.7)
Lymphs Abs: 1.6 10*3/uL (ref 0.7–4.0)
Neutrophils Relative %: 65 % (ref 43–77)

## 2010-08-08 LAB — CBC
Platelets: 193 10*3/uL (ref 150–400)
RBC: 4.52 MIL/uL (ref 4.22–5.81)
RDW: 12.8 % (ref 11.5–15.5)
WBC: 6.8 10*3/uL (ref 4.0–10.5)

## 2010-08-08 LAB — BASIC METABOLIC PANEL
CO2: 27 mEq/L (ref 19–32)
Chloride: 105 mEq/L (ref 96–112)
GFR calc Af Amer: >60 mL/min (ref >60)
Potassium: 4 mEq/L (ref 3.5–5.1)
Sodium: 141 mEq/L (ref 135–145)

## 2010-11-11 LAB — RAPID URINE DRUG SCREEN, HOSP PERFORMED
Amphetamines: NOT DETECTED
Barbiturates: NOT DETECTED
Benzodiazepines: NOT DETECTED
Opiates: NOT DETECTED
Tetrahydrocannabinol: NOT DETECTED

## 2010-11-11 LAB — BASIC METABOLIC PANEL
Chloride: 105
GFR calc Af Amer: >60
GFR calc non Af Amer: >60
Potassium: 4.1

## 2010-11-11 LAB — CBC
HCT: 49.2
MCV: 98.7
Platelets: 252
RBC: 4.98
WBC: 6.9

## 2010-11-11 LAB — DIFFERENTIAL
Eosinophils Absolute: 0.2
Eosinophils Relative: 3
Lymphocytes Relative: 37
Lymphs Abs: 2.5
Monocytes Relative: 7
Neutrophils Relative %: 53

## 2010-11-11 LAB — ETHANOL: Alcohol, Ethyl (B): 217 — ABNORMAL HIGH

## 2011-01-18 ENCOUNTER — Encounter: Payer: Self-pay | Admitting: Cardiology

## 2011-02-15 ENCOUNTER — Telehealth: Payer: Self-pay | Admitting: Cardiology

## 2011-02-15 ENCOUNTER — Other Ambulatory Visit: Payer: Self-pay

## 2011-02-15 MED ORDER — ROSUVASTATIN CALCIUM 40 MG PO TABS: 40.0000 mg | ORAL_TABLET | Freq: Every day | ORAL | Status: DC

## 2011-02-15 MED ORDER — METOPROLOL SUCCINATE ER 25 MG PO TB24: 25.0000 mg | ORAL_TABLET | Freq: Every day | ORAL | Status: DC

## 2011-02-15 MED ORDER — ESOMEPRAZOLE MAGNESIUM 40 MG PO CPDR: 40.0000 mg | DELAYED_RELEASE_CAPSULE | Freq: Every day | ORAL | Status: DC

## 2011-02-15 NOTE — Telephone Encounter (Signed)
Patient needs RX's for Toprol XL 25mg  QD and Crestor 10mg  QD and Nexium 40mg  QD sent to [employer] Patient Assistance Program.  Patient has provided order form.  States that [employer] has been ordering these for him but he does not qualify for assistance from them anymore.  Please call patient with any questions. / tg

## 2011-02-15 NOTE — Telephone Encounter (Signed)
Per Janine Limbo, representative of [employer] patient assistance program, it is ok for [place] to have Joni Reining, NP sign paperwork and RX's since Dr. Daleen Squibb will not be back in office until 03-08-11. Paper work and RX's faxed to [employer] [phone]./LV

## 2011-04-14 ENCOUNTER — Encounter: Payer: Self-pay | Admitting: Cardiology

## 2011-04-14 ENCOUNTER — Ambulatory Visit (INDEPENDENT_AMBULATORY_CARE_PROVIDER_SITE_OTHER): Payer: Self-pay | Admitting: Cardiology

## 2011-04-14 VITALS — BP 124/84 | HR 96 | Resp 16 | Ht 74.0 in | Wt 239.0 lb

## 2011-04-14 DIAGNOSIS — I2510 Atherosclerotic heart disease of native coronary artery without angina pectoris: Secondary | ICD-10-CM

## 2011-04-14 DIAGNOSIS — E785 Hyperlipidemia, unspecified: Secondary | ICD-10-CM

## 2011-04-14 DIAGNOSIS — F17200 Nicotine dependence, unspecified, uncomplicated: Secondary | ICD-10-CM

## 2011-04-14 NOTE — Assessment & Plan Note (Signed)
Stable. Encouraged to continue with current medications. I've given him a prescription for nitroglycerin tablets. I've encouraged him to stop smoking. I will see him back in the office in a year.

## 2011-04-14 NOTE — Progress Notes (Signed)
HPI Erik [name] returns today for evaluation and management of his nonobstructive coronary disease.  And he continues to smoke but has quit drinking. Is not using nitroglycerin but sometimes he says he gets tightness chest he can't breathe. He thinks its panic attacks.  He states he compliant with his medications. He is still followed at the free clinic. I do not have access to the records.  Past Medical History  Diagnosis Date  . Head trauma   . Alcohol abuse   . Depression   . Abnormal stress echocardiogram     Current Outpatient Prescriptions  Medication Sig Dispense Refill  . aspirin 81 MG tablet Take 81 mg by mouth daily.        Marland Kitchen esomeprazole (NEXIUM) 40 MG capsule Take 1 capsule (40 mg total) by mouth daily before breakfast.  90 capsule  3  . metoprolol succinate (TOPROL-XL) 25 MG 24 hr tablet Take 1 tablet (25 mg total) by mouth daily.  90 tablet  3  . rosuvastatin (CRESTOR) 20 MG tablet Take 20 mg by mouth daily.        No Known Allergies  Family History  Problem Relation Age of Onset  . Coronary artery disease Other     History   Social History  . Marital Status: Divorced    Spouse Name: N/A    Number of Children: N/A  . Years of Education: N/A   Occupational History  . Full time    Social History Main Topics  . Smoking status: Current Everyday Smoker  . Smokeless tobacco: Not on file  . Alcohol Use: Yes     Formerly heavy beer, whiskey  . Drug Use: Yes     THC in the past  . Sexually Active: Not on file   Other Topics Concern  . Not on file   Social History Narrative   Single    ROS ALL NEGATIVE EXCEPT THOSE NOTED IN HPI  PE  General Appearance: well developed, well nourished in no acute distress HEENT: symmetrical face, PERRLA, good dentition  Neck: no JVD, thyromegaly, or adenopathy, trachea midline Chest: symmetric without deformity Cardiac: PMI non-displaced, RRR, normal S1, S2, no gallop or murmur Lung: clear to ausculation and  percussion Vascular: all pulses full without bruits  Abdominal: nondistended, nontender, good bowel sounds, no HSM, no bruits Extremities: no cyanosis, clubbing or edema, no sign of DVT, no varicosities  Skin: normal color, no rashes Neuro: alert and oriented x 3, non-focal Pysch: normal affect  EKG Normal sinus rhythm, left anterior fascicular block, no acute changes BMET    Component Value Date/Time   NA 141 08/08/2010 0726   K 4.0 08/08/2010 0726   CL 105 08/08/2010 0726   CO2 27 08/08/2010 0726   GLUCOSE 112* 08/08/2010 0726   BUN 15 08/08/2010 0726   CREATININE 0.88 08/08/2010 0726   CALCIUM 10.1 08/08/2010 0726   GFRNONAA >60 08/08/2010 0726   GFRAA >60 08/08/2010 0726    Lipid Panel     Component Value Date/Time   CHOL 200 04/19/2010 0014   TRIG 116 04/19/2010 0014   HDL 45 04/19/2010 0014   CHOLHDL 4.4 Ratio 04/19/2010 0014   VLDL 23 04/19/2010 0014   LDLCALC 132* 04/19/2010 0014    CBC    Component Value Date/Time   WBC 6.8 08/08/2010 0726   RBC 4.52 08/08/2010 0726   HGB 14.0 08/08/2010 0726   HCT 41.1 08/08/2010 0726   PLT 193 08/08/2010 0726   MCV 90.9  08/08/2010 0726   MCH 31.0 08/08/2010 0726   MCHC 34.1 08/08/2010 0726   RDW 12.8 08/08/2010 0726   LYMPHSABS 1.6 08/08/2010 0726   MONOABS 0.5 08/08/2010 0726   EOSABS 0.2 08/08/2010 0726   BASOSABS 0.0 08/08/2010 0726

## 2011-04-14 NOTE — Patient Instructions (Signed)
**Note De-identified Argentina Kosch Obfuscation** Your physician recommends that you continue on your current medications as directed. Please refer to the Current Medication list given to you today.  Your physician recommends that you schedule a follow-up appointment in: 1 year  

## 2011-04-28 ENCOUNTER — Ambulatory Visit: Payer: Self-pay | Admitting: Cardiology

## 2011-07-13 ENCOUNTER — Other Ambulatory Visit: Payer: Self-pay

## 2011-07-13 MED ORDER — ROSUVASTATIN CALCIUM 20 MG PO TABS: 20.0000 mg | ORAL_TABLET | Freq: Every day | ORAL | Status: DC

## 2011-07-13 MED ORDER — METOPROLOL SUCCINATE ER 25 MG PO TB24: 25.0000 mg | ORAL_TABLET | Freq: Every day | ORAL | Status: DC

## 2011-07-13 MED ORDER — ESOMEPRAZOLE MAGNESIUM 40 MG PO CPDR: 40.0000 mg | DELAYED_RELEASE_CAPSULE | Freq: Every day | ORAL | Status: DC

## 2012-04-19 ENCOUNTER — Encounter (HOSPITAL_COMMUNITY): Payer: Self-pay | Admitting: *Deleted

## 2012-04-19 ENCOUNTER — Emergency Department (HOSPITAL_COMMUNITY): Payer: Self-pay

## 2012-04-19 ENCOUNTER — Emergency Department (HOSPITAL_COMMUNITY)
Admission: EM | Admit: 2012-04-19 | Discharge: 2012-04-19 | Disposition: A | Discharge status: Home / Self Care | Payer: Self-pay | Attending: Emergency Medicine | Admitting: Emergency Medicine

## 2012-04-19 DIAGNOSIS — Z7982 Long term (current) use of aspirin: Secondary | ICD-10-CM | POA: Insufficient documentation

## 2012-04-19 DIAGNOSIS — R609 Edema, unspecified: Secondary | ICD-10-CM | POA: Insufficient documentation

## 2012-04-19 DIAGNOSIS — Z8659 Personal history of other mental and behavioral disorders: Secondary | ICD-10-CM | POA: Insufficient documentation

## 2012-04-19 DIAGNOSIS — F17200 Nicotine dependence, unspecified, uncomplicated: Secondary | ICD-10-CM | POA: Insufficient documentation

## 2012-04-19 DIAGNOSIS — R0789 Other chest pain: Secondary | ICD-10-CM | POA: Insufficient documentation

## 2012-04-19 DIAGNOSIS — Z79899 Other long term (current) drug therapy: Secondary | ICD-10-CM | POA: Insufficient documentation

## 2012-04-19 DIAGNOSIS — I10 Essential (primary) hypertension: Secondary | ICD-10-CM | POA: Insufficient documentation

## 2012-04-19 DIAGNOSIS — E7800 Pure hypercholesterolemia, unspecified: Secondary | ICD-10-CM | POA: Insufficient documentation

## 2012-04-19 DIAGNOSIS — R0602 Shortness of breath: Secondary | ICD-10-CM | POA: Insufficient documentation

## 2012-04-19 DIAGNOSIS — Z87828 Personal history of other (healed) physical injury and trauma: Secondary | ICD-10-CM | POA: Insufficient documentation

## 2012-04-19 HISTORY — DX: Essential (primary) hypertension: I10

## 2012-04-19 HISTORY — DX: Pure hypercholesterolemia, unspecified: E78.00

## 2012-04-19 LAB — CBC WITH DIFFERENTIAL/PLATELET
Basophils Absolute: 0.1 10*3/uL (ref 0.0–0.1)
Basophils Relative: 1 % (ref 0–1)
Eosinophils Absolute: 0.1 10*3/uL (ref 0.0–0.7)
Hemoglobin: 13.3 g/dL (ref 13.0–17.0)
MCH: 31.2 pg (ref 26.0–34.0)
MCHC: 33.3 g/dL (ref 30.0–36.0)
Neutro Abs: 3.9 10*3/uL (ref 1.7–7.7)
Neutrophils Relative %: 65 % (ref 43–77)
Platelets: 256 10*3/uL (ref 150–400)

## 2012-04-19 LAB — TROPONIN I: Troponin I: <0.3 ng/mL (ref <0.30)

## 2012-04-19 LAB — BASIC METABOLIC PANEL
BUN: 7 mg/dL (ref 6–23)
Chloride: 102 mEq/L (ref 96–112)
GFR calc Af Amer: >90 mL/min (ref >90)
GFR calc non Af Amer: >90 mL/min (ref >90)
Potassium: 4.2 mEq/L (ref 3.5–5.1)
Sodium: 138 mEq/L (ref 135–145)

## 2012-04-19 NOTE — ED Notes (Signed)
Patient transported to [facility] 

## 2012-04-19 NOTE — ED Notes (Signed)
Pt states swelling to lower legs and feet since Tuesday. Pt also states SOB at times. NAD at this time.

## 2012-04-19 NOTE — ED Provider Notes (Signed)
History     CSN: [id number]  Arrival date & time 04/19/12  4098   First MD Initiated Contact with Patient 04/19/12 1015      Chief Complaint  Patient presents with  . Leg Swelling  . Shortness of Breath     HPI Pt was seen at 1015.   Per pt, c/o gradual onset and persistence of constant bilat LE's "swelling," SOB and generalized chest "pain" for the past 3 days.  Pt describes the CP as "tightness," has been constant x3 days. Pedal edema improves with elevation, worsens when his legs are dependent. States he was eval by his PMD at the clinic and told to come to the ED "to make sure I didn't have blood clots or heart failure."  Denies palpitations, no cough, no abd pain, no back pain, no N/V/D, no fevers, no rash.    Cards: Dr. Daleen Squibb Past Medical History  Diagnosis Date  . Head trauma   . Alcohol abuse   . Depression   . Abnormal stress echocardiogram   . Hypercholesterolemia   . Hypertension     Past Surgical History  Procedure Laterality Date  . Kidney surgery      >30 years ago    Family History  Problem Relation Age of Onset  . Coronary artery disease Other     History  Substance Use Topics  . Smoking status: Current Every Day Smoker  . Smokeless tobacco: Not on file  . Alcohol Use: No     Comment: Formerly heavy beer, whiskey    Review of Systems ROS: Statement: All systems negative except as marked or noted in the HPI; Constitutional: Negative for fever and chills. ; ; Eyes: Negative for eye pain, redness and discharge. ; ; ENMT: Negative for ear pain, hoarseness, nasal congestion, sinus pressure and sore throat. ; ; Cardiovascular: +CP, SOB, peripheral edema. Negative for palpitations, diaphoresis. ; ; Respiratory: Negative for cough, wheezing and stridor. ; ; Gastrointestinal: Negative for nausea, vomiting, diarrhea, abdominal pain, blood in stool, hematemesis, jaundice and rectal bleeding. . ; ; Genitourinary: Negative for dysuria, flank pain and hematuria. ; ;  Musculoskeletal: Negative for back pain and neck pain. Negative for swelling and trauma.; ; Skin: Negative for pruritus, rash, abrasions, blisters, bruising and skin lesion.; ; Neuro: Negative for headache, lightheadedness and neck stiffness. Negative for weakness, altered level of consciousness , altered mental status, extremity weakness, paresthesias, involuntary movement, seizure and syncope.     Allergies  Review of patient's allergies indicates no known allergies.  Home Medications   Current Outpatient Rx  Name  Route  Sig  Dispense  Refill  . aspirin 81 MG tablet   Oral   Take 81 mg by mouth daily.           Marland Kitchen esomeprazole (NEXIUM) 40 MG capsule   Oral   Take 1 capsule (40 mg total) by mouth daily before breakfast.   90 capsule   3   . metoprolol succinate (TOPROL-XL) 25 MG 24 hr tablet   Oral   Take 1 tablet (25 mg total) by mouth daily.   90 tablet   3   . rosuvastatin (CRESTOR) 20 MG tablet   Oral   Take 1 tablet (20 mg total) by mouth daily.   90 tablet   3     BP 145/74  Pulse 75  Temp(Src) 98.5 F (36.9 C) (Oral)  Resp 20  Ht 6\' 2"  (1.88 m)  Wt 240 lb (108.863 kg)  BMI 30.8 kg/m2  SpO2 97%  Physical Exam 1020: Physical examination:  Nursing notes reviewed; Vital signs and O2 SAT reviewed;  Constitutional: Well developed, Well nourished, Well hydrated, In no acute distress; Head:  Normocephalic, atraumatic; Eyes: EOMI, PERRL, No scleral icterus; ENMT: Mouth and pharynx normal, Mucous membranes moist; Neck: Supple, Full range of motion, No lymphadenopathy; Cardiovascular: Regular rate and rhythm, No murmur, rub, or gallop; Respiratory: Breath sounds clear & equal bilaterally, No rales, rhonchi, wheezes.  Speaking full sentences with ease, Normal respiratory effort/excursion; Chest: Nontender, Movement normal; Abdomen: Soft, Nontender, Nondistended, Normal bowel sounds; Genitourinary: No CVA tenderness; Extremities: Pulses normal, No tenderness, 1+ pedal edema  feet to knees bilat without calf asymmetry, no erythema, no open wounds. Strong/equal pedal pulses bilat..; Neuro: AA&Ox3, Major CN grossly intact.  Speech clear. Climbs on and off stretcher by himself without distress. Gait steady. No gross focal motor or sensory deficits in extremities.; Skin: Color normal, Warm, Dry.   ED Course  Procedures     MDM  MDM Reviewed: previous chart, nursing note and vitals Reviewed previous: labs and ECG Interpretation: labs, ECG, x-ray and ultrasound    Date: 04/19/2012  Rate: 69  Rhythm: normal sinus rhythm  QRS Axis: left  Intervals: normal  ST/T Wave abnormalities: normal  Conduction Disutrbances:nonspecific intraventricular conduction delay  Narrative Interpretation:   Old EKG Reviewed: unchanged; no significant changes from previous EKG dated 04/14/2011.   Results for orders placed during the hospital encounter of 04/19/12  CBC WITH DIFFERENTIAL      Result Value Range   WBC 6.0  4.0 - 10.5 K/uL   RBC 4.26  4.22 - 5.81 MIL/uL   Hemoglobin 13.3  13.0 - 17.0 g/dL   HCT 16.1  09.6 - 04.5 %   MCV 93.7  78.0 - 100.0 fL   MCH 31.2  26.0 - 34.0 pg   MCHC 33.3  30.0 - 36.0 g/dL   RDW 40.9  81.1 - 91.4 %   Platelets 256  150 - 400 K/uL   Neutrophils Relative 65  43 - 77 %   Neutro Abs 3.9  1.7 - 7.7 K/uL   Lymphocytes Relative 24  12 - 46 %   Lymphs Abs 1.4  0.7 - 4.0 K/uL   Monocytes Relative 9  3 - 12 %   Monocytes Absolute 0.5  0.1 - 1.0 K/uL   Eosinophils Relative 2  0 - 5 %   Eosinophils Absolute 0.1  0.0 - 0.7 K/uL   Basophils Relative 1  0 - 1 %   Basophils Absolute 0.1  0.0 - 0.1 K/uL  BASIC METABOLIC PANEL      Result Value Range   Sodium 138  135 - 145 mEq/L   Potassium 4.2  3.5 - 5.1 mEq/L   Chloride 102  96 - 112 mEq/L   CO2 29  19 - 32 mEq/L   Glucose, Bld 100 (*) 70 - 99 mg/dL   BUN 7  6 - 23 mg/dL   Creatinine, Ser 7.82  0.50 - 1.35 mg/dL   Calcium 9.0  8.4 - 95.6 mg/dL   GFR calc non Af Amer >90  >90 mL/min   GFR  calc Af Amer >90  >90 mL/min  TROPONIN I      Result Value Range   Troponin I <0.30  <0.30 ng/mL  D-DIMER, QUANTITATIVE      Result Value Range   D-Dimer, Quant 0.37  0.00 - 0.48 ug/mL-FEU  PRO B NATRIURETIC  PEPTIDE      Result Value Range   Pro B Natriuretic peptide (BNP) 358.6 (*) 0 - 125 pg/mL   US Venous Img Lower Bilateral 04/19/2012  *RADIOLOGY REPORT*  Clinical Data: 57 year old male with bilateral lower extremity pain and swelling.  BILATERAL LOWER EXTREMITY VENOUS DUPLEX ULTRASOUND  Technique:  Gray-scale sonography with graded compression, as well as color Doppler and duplex ultrasound, were performed to evaluate the deep venous system of both lower extremities from the level of the common femoral vein through the popliteal and proximal calf veins.  Spectral Doppler was utilized to evaluate flow at rest and with distal augmentation maneuvers.  Comparison:  None.  Findings:  Normal compressibility of bilateral common femoral, superficial femoral, and popliteal veins is demonstrated, as well as the visualized proximal calf veins.  No filling defects to suggest DVT on grayscale or color Doppler imaging.  Doppler waveforms show normal direction of venous flow, normal respiratory phasicity and response to augmentation.  IMPRESSION: No evidence of deep vein thrombosis in either lower extremity.   Original Report Authenticated By: Harmon Pier, M.D.    Dg Chest Portable 1 View 04/19/2012  *RADIOLOGY REPORT*  Clinical Data: Shortness of breath.  Lower extremity edema.  PORTABLE CHEST - 1 VIEW  Comparison: CT chest 02/17/2010 and PA and lateral chest 03/02/2009.  Findings: The lungs are emphysematous but clear.  There is no evidence of pulmonary edema.  Heart size is normal.  No pneumothorax or pleural effusion.  IMPRESSION: Emphysema.  No acute finding.   Original Report Authenticated By: Holley Dexter, M.D.     [phone]:  No acute findings on workup today.  Doubt PE with negative d-dimer. Doubt ACS  as cause for symptoms given normal troponin and unchanged EKG after 3 days of constant symptoms. Will tx pedal edema symptomatically with legs elevation, low sodium diet, compression stockings.  Pt states he already has f/u appt scheduled with his Cards MD (Dr. Daleen Squibb) in the next several weeks.  Wants to go home now.  Dx and testing d/w pt.  Questions answered.  Verb understanding, agreeable to d/c home with outpt f/u.           Laray Anger, DO 04/22/12 2246

## 2012-04-19 NOTE — ED Notes (Signed)
Dr. McManus at bedside. 

## 2012-04-24 ENCOUNTER — Encounter: Payer: Self-pay | Admitting: Cardiology

## 2012-04-24 ENCOUNTER — Ambulatory Visit (INDEPENDENT_AMBULATORY_CARE_PROVIDER_SITE_OTHER): Payer: Self-pay | Admitting: Cardiology

## 2012-04-24 VITALS — BP 116/84 | HR 101 | Ht 74.0 in | Wt 253.0 lb

## 2012-04-24 MED ORDER — FUROSEMIDE 20 MG PO TABS: 20.0000 mg | ORAL_TABLET | Freq: Every day | ORAL | Status: DC

## 2012-04-24 MED ORDER — POTASSIUM CHLORIDE CRYS ER 20 MEQ PO TBCR: 20.0000 meq | EXTENDED_RELEASE_TABLET | Freq: Every day | ORAL | Status: DC

## 2012-04-24 NOTE — Progress Notes (Signed)
HPI [name] returns today after visiting the emergency room with edema. He was evaluated there on March 14. Chest x-ray showed no acute abnormality, troponin was negative, BNP was 358.6, and lower extremity Doppler showed no DVT.  He has a long history smoking and has COPD. He is also overweight.  Denies any orthopnea, PND. He's had no chest pain or angina. He denies any presyncope or syncope.  Has a history of nonobstructive coronary disease by cath 11/06/2009. At that time he had normal left ventricular systolic function.  He says the swelling causes legs ache. His mother died of a pulmonary embolus and he is worried about having this. He quit smoking recently.  Past Medical History  Diagnosis Date  . Head trauma   . Alcohol abuse   . Depression   . Abnormal stress echocardiogram   . Hypercholesterolemia   . Hypertension     Current Outpatient Prescriptions  Medication Sig Dispense Refill  . aspirin 81 MG tablet Take 81 mg by mouth daily.        Marland Kitchen esomeprazole (NEXIUM) 40 MG capsule Take 1 capsule (40 mg total) by mouth daily before breakfast.  90 capsule  3  . metoprolol succinate (TOPROL-XL) 25 MG 24 hr tablet Take 1 tablet (25 mg total) by mouth daily.  90 tablet  3  . rosuvastatin (CRESTOR) 20 MG tablet Take 1 tablet (20 mg total) by mouth daily.  90 tablet  3  . furosemide (LASIX) 20 MG tablet Take 1 tablet (20 mg total) by mouth daily.  45 tablet  11  . potassium chloride SA (K-DUR,KLOR-CON) 20 MEQ tablet Take 1 tablet (20 mEq total) by mouth daily.  30 tablet  11  . [DISCONTINUED] Dexlansoprazole (DEXILANT) 30 MG capsule Take 30 mg by mouth daily.         No current facility-administered medications for this visit.    No Known Allergies  Family History  Problem Relation Age of Onset  . Coronary artery disease Other     History   Social History  . Marital Status: Divorced    Spouse Name: N/A    Number of Children: N/A  . Years of Education: N/A    Occupational History  . Full time    Social History Main Topics  . Smoking status: Former Games developer  . Smokeless tobacco: Not on file     Comment: QUIT 6-7 WEEKS AGO  . Alcohol Use: No     Comment: Formerly heavy beer, whiskey  . Drug Use: Yes     Comment: THC in the past  . Sexually Active: Not on file   Other Topics Concern  . Not on file   Social History Narrative   Single    ROS ALL NEGATIVE EXCEPT THOSE NOTED IN HPI  PE  General Appearance: well developed, well nourished in no acute distress, obese HEENT: symmetrical face, PERRLA, good dentition  Neck: no JVD, thyromegaly, or adenopathy, trachea midline Chest: symmetric without deformity Cardiac: PMI non-displaced, RRR, normal S1, S2, no gallop or murmur Lung: clear to ausculation and percussion Vascular: all pulses full without bruits  Abdominal: nondistended, nontender, good bowel sounds, no HSM, no bruits Extremities: no cyanosis, clubbing , 2+ pitting edema, no sign of DVT, no varicosities  Skin: normal color, psoriasis Neuro: alert and oriented x 3, non-focal Pysch: normal affect  EKG  BMET    Component Value Date/Time   NA 138 04/19/2012 1012   K 4.2 04/19/2012 1012   CL 102  04/19/2012 1012   CO2 29 04/19/2012 1012   GLUCOSE 100* 04/19/2012 1012   BUN 7 04/19/2012 1012   CREATININE 0.89 04/19/2012 1012   CALCIUM 9.0 04/19/2012 1012   GFRNONAA >90 04/19/2012 1012   GFRAA >90 04/19/2012 1012    Lipid Panel     Component Value Date/Time   CHOL 200 04/19/2010 0014   TRIG 116 04/19/2010 0014   HDL 45 04/19/2010 0014   CHOLHDL 4.4 Ratio 04/19/2010 0014   VLDL 23 04/19/2010 0014   LDLCALC 132* 04/19/2010 0014    CBC    Component Value Date/Time   WBC 6.0 04/19/2012 1012   RBC 4.26 04/19/2012 1012   HGB 13.3 04/19/2012 1012   HCT 39.9 04/19/2012 1012   PLT 256 04/19/2012 1012   MCV 93.7 04/19/2012 1012   MCH 31.2 04/19/2012 1012   MCHC 33.3 04/19/2012 1012   RDW 13.3 04/19/2012 1012   LYMPHSABS 1.4 04/19/2012  1012   MONOABS 0.5 04/19/2012 1012   EOSABS 0.1 04/19/2012 1012   BASOSABS 0.1 04/19/2012 1012

## 2012-04-24 NOTE — Patient Instructions (Addendum)
Your physician recommends that you schedule a follow-up appointment in: ONE YEAR  Your physician has recommended you make the following change in your medication:   1) START FUROSEMIDE 20MG  ONE TABLET EVERY MORNING 2) START POTASSIUM ONE TABLET EVERY MORNING

## 2012-04-24 NOTE — Assessment & Plan Note (Signed)
This is most likely secondary to some pulmonary disease from his long history of smoking as well as being overweight. I placed him on 20 mg of furosemide and 20 mEq potassium. I've encouraged him to stop smoking. [facility]

## 2012-04-24 NOTE — Addendum Note (Signed)
Addended by: Derry Lory A on: 04/24/2012 03:38 PM   Modules accepted: Orders

## 2012-05-17 ENCOUNTER — Telehealth: Payer: Self-pay | Admitting: Cardiology

## 2012-05-17 NOTE — Telephone Encounter (Signed)
PT WAS PUT ON LASIX AND POTASSIUM AT LAST VISIT, PATIENT IS CHECKING TO MAKE SURE THAT HE IS TO CONTINUE TAKING THESE ON A DAILY BASIS.  PATIENT WAS ALSO PUT ON CITALOPRAM 10 MG FOR ANXIETY AND WANTS TO MAKE SURE THIS OS OKAY TO TAKE WITH EVERYTHING ELSE.

## 2012-05-17 NOTE — Telephone Encounter (Signed)
.  left message to have patient return my call to clarify if pt is tolerating current lasix dosage well

## 2012-05-17 NOTE — Telephone Encounter (Signed)
Pt did note that he is tolerating the lasix and potassium well, however noted MD Wall advised this maybe a temporary medication for him and he is concerned with warnings on potassium that states to avoid sun exposure and pt notes he rides his bicycle daily for transportation, pt clarified the swelling has decreased considerably however does note swelling once again if he stands on his feet for too long with complete reduction of fluid overnight, also wanted to clarify with MD Wall that Citalopram 10mg  is ok to take with his current medications, please advise

## 2012-05-27 NOTE — Telephone Encounter (Signed)
OK to take both. Continue potassium.

## 2012-05-27 NOTE — Telephone Encounter (Signed)
Spoke to pt to advise results/instructions. Pt understood.  

## 2012-08-08 ENCOUNTER — Emergency Department (HOSPITAL_COMMUNITY): Payer: Self-pay

## 2012-08-08 ENCOUNTER — Encounter (HOSPITAL_COMMUNITY): Payer: Self-pay | Admitting: Emergency Medicine

## 2012-08-08 ENCOUNTER — Emergency Department (HOSPITAL_COMMUNITY)
Admission: EM | Admit: 2012-08-08 | Discharge: 2012-08-08 | Disposition: A | Discharge status: Home / Self Care | Payer: Self-pay | Attending: Emergency Medicine | Admitting: Emergency Medicine

## 2012-08-08 DIAGNOSIS — E7800 Pure hypercholesterolemia, unspecified: Secondary | ICD-10-CM | POA: Insufficient documentation

## 2012-08-08 DIAGNOSIS — F3289 Other specified depressive episodes: Secondary | ICD-10-CM | POA: Insufficient documentation

## 2012-08-08 DIAGNOSIS — Z87828 Personal history of other (healed) physical injury and trauma: Secondary | ICD-10-CM | POA: Insufficient documentation

## 2012-08-08 DIAGNOSIS — F329 Major depressive disorder, single episode, unspecified: Secondary | ICD-10-CM | POA: Insufficient documentation

## 2012-08-08 DIAGNOSIS — Z79899 Other long term (current) drug therapy: Secondary | ICD-10-CM | POA: Insufficient documentation

## 2012-08-08 DIAGNOSIS — F411 Generalized anxiety disorder: Secondary | ICD-10-CM | POA: Insufficient documentation

## 2012-08-08 DIAGNOSIS — F17200 Nicotine dependence, unspecified, uncomplicated: Secondary | ICD-10-CM | POA: Insufficient documentation

## 2012-08-08 DIAGNOSIS — I10 Essential (primary) hypertension: Secondary | ICD-10-CM | POA: Insufficient documentation

## 2012-08-08 DIAGNOSIS — F419 Anxiety disorder, unspecified: Secondary | ICD-10-CM

## 2012-08-08 DIAGNOSIS — R0602 Shortness of breath: Secondary | ICD-10-CM | POA: Insufficient documentation

## 2012-08-08 LAB — BASIC METABOLIC PANEL
CO2: 25 mEq/L (ref 19–32)
Calcium: 9.2 mg/dL (ref 8.4–10.5)
Creatinine, Ser: 0.86 mg/dL (ref 0.50–1.35)
GFR calc Af Amer: >90 mL/min (ref >90)
GFR calc non Af Amer: >90 mL/min (ref >90)
Sodium: 136 mEq/L (ref 135–145)

## 2012-08-08 LAB — CBC WITH DIFFERENTIAL/PLATELET
Basophils Absolute: 0.1 10*3/uL (ref 0.0–0.1)
Basophils Relative: 1 % (ref 0–1)
Eosinophils Absolute: 0.1 10*3/uL (ref 0.0–0.7)
Eosinophils Relative: 2 % (ref 0–5)
HCT: 45 % (ref 39.0–52.0)
MCHC: 35.1 g/dL (ref 30.0–36.0)
MCV: 89.6 fL (ref 78.0–100.0)
Monocytes Absolute: 0.5 10*3/uL (ref 0.1–1.0)
Platelets: 216 10*3/uL (ref 150–400)
RDW: 13.2 % (ref 11.5–15.5)

## 2012-08-08 LAB — TROPONIN I: Troponin I: <0.3 ng/mL (ref <0.30)

## 2012-08-08 NOTE — ED Notes (Signed)
Patient ambulated around nurses station O2 sat 95%

## 2012-08-08 NOTE — ED Notes (Signed)
Pt walking , became sob and induced a panic attack per pt after that. ems called out for sob. Pt c/o L posterior shoulder. Pt received 324 asa pta. cbg 94. Pt alert/oriented upon arrival. No sob observed.

## 2012-08-08 NOTE — ED Provider Notes (Signed)
History    CSN: [id number] Arrival date & time 08/08/12  0939  First MD Initiated Contact with Patient 08/08/12 [phone]     Chief Complaint  Patient presents with  . Shortness of Breath   (Consider location/radiation/quality/duration/timing/severity/associated sxs/prior Treatment) Patient is a 57 y.o. male presenting with shortness of breath. The history is provided by the patient.  Shortness of Breath Severity:  Moderate Onset quality:  Gradual Duration:  2 hours Timing:  Constant Progression:  Partially resolved Chronicity:  Recurrent Context comment:  No known cause Relieved by:  Oxygen, rest and sitting up Worsened by:  Nothing tried Associated symptoms: sputum production   Associated symptoms: no abdominal pain, no chest pain, no cough, no ear pain, no fever, no sore throat, no vomiting and no wheezing  Headaches: after NTG.   Risk factors: hx of PE/DVT   Risk factors: no recent surgery  Recent alcohol use: occasional none today.    Erik [name] is a 56 y.o. male who presents to the ED via EMS with shortness of breath. He states that he was walking and developed shortness of breath, feeling dizzy and left shoulder pain. Went inside the house and thought maybe it was an anxiety attack so took deep breaths and tried to rest. Symptoms did not improve and he thought he may pass out so called 911. EMS arrived and patient continued to have symptoms and was given NTG and ASA and symptoms improved. Patient states he feels a lot better but still feels a little jittery and tired but breathing is normal.   BP 147/88  Pulse 88  Temp(Src) 98.1 F (36.7 C) (Oral)  Resp 21  SpO2 96%  Patient states he usually rides a bicycle and walks without becoming short of breath. Past Medical History  Diagnosis Date  . Head trauma   . Alcohol abuse   . Depression   . Abnormal stress echocardiogram   . Hypercholesterolemia   . Hypertension    Past Surgical History  Procedure Laterality Date   . Kidney surgery      >30 years ago  . Hernia repair    . Cardiac cath    . Brain surgery     Family History  Problem Relation Age of Onset  . Coronary artery disease Other    History  Substance Use Topics  . Smoking status: Current Every Day Smoker  . Smokeless tobacco: Not on file     Comment: QUIT 6-7 WEEKS AGO  . Alcohol Use: Yes     Comment: beer, weekly-5-6 in a day when he drinks    Review of Systems  Constitutional: Negative for fever.  HENT: Negative for ear pain and sore throat.   Respiratory: Positive for sputum production and shortness of breath. Negative for cough and wheezing.   Cardiovascular: Negative for chest pain.  Gastrointestinal: Negative for vomiting and abdominal pain.  Neurological: Headaches: after NTG.    Allergies  Review of patient's allergies indicates no known allergies.  Home Medications   Current Outpatient Rx  Name  Route  Sig  Dispense  Refill  . citalopram (CELEXA) 10 MG tablet   Oral   Take 10 mg by mouth daily.         Marland Kitchen esomeprazole (NEXIUM) 40 MG capsule   Oral   Take 40 mg by mouth daily before breakfast.         . metoprolol succinate (TOPROL-XL) 25 MG 24 hr tablet   Oral   Take 1  tablet (25 mg total) by mouth daily.   90 tablet   3   . rosuvastatin (CRESTOR) 20 MG tablet   Oral   Take 1 tablet (20 mg total) by mouth daily.   90 tablet   3   . aspirin 81 MG tablet   Oral   Take 81 mg by mouth daily.            BP 147/88  Pulse 88  Temp(Src) 98.1 F (36.7 C) (Oral)  Resp 21  SpO2 96% Physical Exam  Nursing note and vitals reviewed. Constitutional: He is oriented to person, place, and time. He appears well-developed and well-nourished.  HENT:  Head: Normocephalic and atraumatic.  Eyes: Conjunctivae and EOM are normal. Pupils are equal, round, and reactive to light.  Neck: Trachea normal and normal range of motion. Neck supple. Normal carotid pulses and no JVD present. Carotid bruit is not present.   Cardiovascular: Normal rate.   Pulmonary/Chest: Effort normal.  Slightly decreased breath sounds right lower   Abdominal: Soft. He exhibits no pulsatile midline mass. There is no tenderness. There is no CVA tenderness.  Musculoskeletal: Normal range of motion.  Neurological: He is alert and oriented to person, place, and time. No cranial nerve deficit.  Skin: Skin is warm and dry.  Psychiatric: He has a normal mood and affect. His behavior is normal.    ED Course  Procedures  Dg Chest Portable 1 View  08/08/2012   *RADIOLOGY REPORT*  Clinical Data: Short of breath  PORTABLE CHEST - 1 VIEW  Comparison: 04/19/2012  Findings: Artifact overlies chest.  Heart size is normal. Mediastinal shadows are normal.  There is mild atelectasis at the right lung base.  No effusions.  No visible bony abnormality.  IMPRESSION: Mild atelectasis at the right base.   Original Report Authenticated By: Paulina Fusi, M.D.    Results for orders placed during the hospital encounter of 08/08/12 (from the past 24 hour(s))  CBC WITH DIFFERENTIAL     Status: None   Collection Time    08/08/12 10:12 AM      Result Value Range   WBC 8.2  4.0 - 10.5 K/uL   RBC 5.02  4.22 - 5.81 MIL/uL   Hemoglobin 15.8  13.0 - 17.0 g/dL   HCT 09.8  11.9 - 14.7 %   MCV 89.6  78.0 - 100.0 fL   MCH 31.5  26.0 - 34.0 pg   MCHC 35.1  30.0 - 36.0 g/dL   RDW 82.9  56.2 - 13.0 %   Platelets 216  150 - 400 K/uL   Neutrophils Relative % 74  43 - 77 %   Neutro Abs 6.1  1.7 - 7.7 K/uL   Lymphocytes Relative 17  12 - 46 %   Lymphs Abs 1.4  0.7 - 4.0 K/uL   Monocytes Relative 6  3 - 12 %   Monocytes Absolute 0.5  0.1 - 1.0 K/uL   Eosinophils Relative 2  0 - 5 %   Eosinophils Absolute 0.1  0.0 - 0.7 K/uL   Basophils Relative 1  0 - 1 %   Basophils Absolute 0.1  0.0 - 0.1 K/uL  BASIC METABOLIC PANEL     Status: Abnormal   Collection Time    08/08/12 10:12 AM      Result Value Range   Sodium 136  135 - 145 mEq/L   Potassium 3.9  3.5 - 5.1  mEq/L   Chloride 99  96 -  112 mEq/L   CO2 25  19 - 32 mEq/L   Glucose, Bld 101 (*) 70 - 99 mg/dL   BUN 9  6 - 23 mg/dL   Creatinine, Ser 1.61  0.50 - 1.35 mg/dL   Calcium 9.2  8.4 - 09.6 mg/dL   GFR calc non Af Amer >90  >90 mL/min   GFR calc Af Amer >90  >90 mL/min  TROPONIN I     Status: None   Collection Time    08/08/12 10:12 AM      Result Value Range   Troponin I <0.30  <0.30 ng/mL  D-DIMER, QUANTITATIVE     Status: None   Collection Time    08/08/12 10:12 AM      Result Value Range   D-Dimer, Quant <0.27  0.00 - 0.48 ug/mL-FEU    MDM: I discussed this case with Dr. Bernette Mayers  EKG Normal sinus rhythm no change from previous EKG 57 y.o. male with shortness of breath and feeling anxious. Symptoms resolved upon arrival to the ED.  Patient stable for discharge without any immediate complications. Evaluation does not represent cardiac event at this time. Patient without any symptoms at time of discharge. Ambulated with continued O2 Sat at 95% on room air. He is to follow up with his doctor.  Janne Napoleon, [place] 08/08/12 1251

## 2012-08-08 NOTE — ED Provider Notes (Signed)
Medical screening examination/treatment/procedure(s) were performed by non-physician practitioner and as supervising physician I was immediately available for consultation/collaboration.   Charles B. Bernette Mayers, MD 08/08/12 1254

## 2012-08-26 ENCOUNTER — Inpatient Hospital Stay (HOSPITAL_COMMUNITY)
Admission: EM | Admit: 2012-08-26 | Discharge: 2012-08-31 | DRG: 087 | Disposition: A | Discharge status: Home / Self Care | Payer: MEDICAID | Attending: Neurology | Admitting: Neurology

## 2012-08-26 ENCOUNTER — Encounter (HOSPITAL_COMMUNITY): Payer: Self-pay

## 2012-08-26 ENCOUNTER — Emergency Department (HOSPITAL_COMMUNITY): Payer: Self-pay

## 2012-08-26 DIAGNOSIS — E785 Hyperlipidemia, unspecified: Secondary | ICD-10-CM | POA: Diagnosis present

## 2012-08-26 DIAGNOSIS — I10 Essential (primary) hypertension: Secondary | ICD-10-CM | POA: Diagnosis present

## 2012-08-26 DIAGNOSIS — F1010 Alcohol abuse, uncomplicated: Secondary | ICD-10-CM | POA: Diagnosis present

## 2012-08-26 DIAGNOSIS — S0990XA Unspecified injury of head, initial encounter: Secondary | ICD-10-CM | POA: Diagnosis present

## 2012-08-26 DIAGNOSIS — F329 Major depressive disorder, single episode, unspecified: Secondary | ICD-10-CM | POA: Diagnosis present

## 2012-08-26 DIAGNOSIS — S066X9A Traumatic subarachnoid hemorrhage with loss of consciousness of unspecified duration, initial encounter: Principal | ICD-10-CM | POA: Diagnosis present

## 2012-08-26 DIAGNOSIS — I609 Nontraumatic subarachnoid hemorrhage, unspecified: Secondary | ICD-10-CM

## 2012-08-26 DIAGNOSIS — F3289 Other specified depressive episodes: Secondary | ICD-10-CM | POA: Diagnosis present

## 2012-08-26 DIAGNOSIS — S0291XA Unspecified fracture of skull, initial encounter for closed fracture: Secondary | ICD-10-CM

## 2012-08-26 DIAGNOSIS — I951 Orthostatic hypotension: Secondary | ICD-10-CM | POA: Diagnosis present

## 2012-08-26 DIAGNOSIS — F0781 Postconcussional syndrome: Secondary | ICD-10-CM | POA: Diagnosis present

## 2012-08-26 DIAGNOSIS — S065X9A Traumatic subdural hemorrhage with loss of consciousness of unspecified duration, initial encounter: Secondary | ICD-10-CM

## 2012-08-26 DIAGNOSIS — S066XAA Traumatic subarachnoid hemorrhage with loss of consciousness status unknown, initial encounter: Principal | ICD-10-CM | POA: Diagnosis present

## 2012-08-26 DIAGNOSIS — Z87820 Personal history of traumatic brain injury: Secondary | ICD-10-CM

## 2012-08-26 DIAGNOSIS — W19XXXA Unspecified fall, initial encounter: Secondary | ICD-10-CM | POA: Diagnosis present

## 2012-08-26 DIAGNOSIS — F4310 Post-traumatic stress disorder, unspecified: Secondary | ICD-10-CM | POA: Diagnosis present

## 2012-08-26 DIAGNOSIS — F1210 Cannabis abuse, uncomplicated: Secondary | ICD-10-CM | POA: Diagnosis present

## 2012-08-26 DIAGNOSIS — F17200 Nicotine dependence, unspecified, uncomplicated: Secondary | ICD-10-CM | POA: Diagnosis present

## 2012-08-26 LAB — BASIC METABOLIC PANEL
BUN: 10 mg/dL (ref 6–23)
GFR calc Af Amer: >90 mL/min (ref >90)
GFR calc non Af Amer: >90 mL/min (ref >90)
Potassium: 3.7 mEq/L (ref 3.5–5.1)
Sodium: 133 mEq/L — ABNORMAL LOW (ref 135–145)

## 2012-08-26 LAB — CBC WITH DIFFERENTIAL/PLATELET
Basophils Absolute: 0 10*3/uL (ref 0.0–0.1)
Basophils Relative: 0 % (ref 0–1)
Eosinophils Absolute: 0.1 10*3/uL (ref 0.0–0.7)
MCH: 31.2 pg (ref 26.0–34.0)
MCHC: 34.2 g/dL (ref 30.0–36.0)
Monocytes Relative: 9 % (ref 3–12)
Neutrophils Relative %: 73 % (ref 43–77)
Platelets: 173 10*3/uL (ref 150–400)
RDW: 13.5 % (ref 11.5–15.5)

## 2012-08-26 LAB — APTT: aPTT: 31 seconds (ref 24–37)

## 2012-08-26 MED ORDER — PANTOPRAZOLE SODIUM 40 MG IV SOLR
40.0000 mg | Freq: Every day | INTRAVENOUS | Status: DC
  Administered 2012-08-26 – 2012-08-27 (×2): 40 mg via INTRAVENOUS
  Filled 2012-08-26 (×3): qty 40

## 2012-08-26 MED ORDER — LABETALOL HCL 5 MG/ML IV SOLN: 10.0000 mg | INTRAVENOUS | Status: DC | PRN

## 2012-08-26 MED ORDER — HYDROMORPHONE HCL PF 1 MG/ML IJ SOLN
0.5000 mg | Freq: Once | INTRAMUSCULAR | Status: AC
  Administered 2012-08-26: 0.5 mg via INTRAVENOUS
  Filled 2012-08-26: qty 1

## 2012-08-26 MED ORDER — SODIUM CHLORIDE 0.9 % IJ SOLN
INTRAMUSCULAR | Status: AC
  Administered 2012-08-26: 10 mL
  Filled 2012-08-26: qty 10

## 2012-08-26 MED ORDER — ACETAMINOPHEN 325 MG PO TABS
650.0000 mg | ORAL_TABLET | ORAL | Status: DC | PRN
  Administered 2012-08-27: 650 mg via ORAL
  Filled 2012-08-26: qty 2

## 2012-08-26 MED ORDER — SENNOSIDES-DOCUSATE SODIUM 8.6-50 MG PO TABS
1.0000 | ORAL_TABLET | Freq: Two times a day (BID) | ORAL | Status: DC
  Administered 2012-08-26 – 2012-08-31 (×3): 1 via ORAL
  Filled 2012-08-26 (×10): qty 1

## 2012-08-26 MED ORDER — VALPROATE SODIUM 500 MG/5ML IV SOLN
500.0000 mg | Freq: Two times a day (BID) | INTRAVENOUS | Status: DC
  Administered 2012-08-26 – 2012-08-30 (×9): 500 mg via INTRAVENOUS
  Filled 2012-08-26 (×14): qty 5

## 2012-08-26 MED ORDER — SODIUM CHLORIDE 0.9 % IV SOLN
INTRAVENOUS | Status: DC
  Administered 2012-08-26 – 2012-08-29 (×3): via INTRAVENOUS

## 2012-08-26 MED ORDER — HYDROMORPHONE HCL PF 1 MG/ML IJ SOLN
1.0000 mg | Freq: Once | INTRAMUSCULAR | Status: AC
  Administered 2012-08-26: 1 mg via INTRAVENOUS
  Filled 2012-08-26: qty 1

## 2012-08-26 MED ORDER — HYDROMORPHONE HCL PF 1 MG/ML IJ SOLN
0.5000 mg | Freq: Once | INTRAMUSCULAR | Status: AC
  Administered 2012-08-26: 0.5 mg via INTRAVENOUS

## 2012-08-26 MED ORDER — ACETAMINOPHEN 650 MG RE SUPP: 650.0000 mg | RECTAL | Status: DC | PRN

## 2012-08-26 MED ORDER — HYDROMORPHONE HCL PF 1 MG/ML IJ SOLN
1.0000 mg | INTRAMUSCULAR | Status: AC | PRN
  Administered 2012-08-26: 1 mg via INTRAVENOUS
  Filled 2012-08-26 (×2): qty 1

## 2012-08-26 MED ORDER — ONDANSETRON HCL 4 MG/2ML IJ SOLN: 4.0000 mg | Freq: Three times a day (TID) | INTRAMUSCULAR | Status: AC | PRN

## 2012-08-26 MED ORDER — HYDROMORPHONE HCL PF 1 MG/ML IJ SOLN
INTRAMUSCULAR | Status: AC
  Administered 2012-08-26: 1 mg via INTRAVENOUS
  Filled 2012-08-26: qty 1

## 2012-08-26 MED ORDER — KETOROLAC TROMETHAMINE 30 MG/ML IJ SOLN
30.0000 mg | Freq: Three times a day (TID) | INTRAMUSCULAR | Status: DC
  Administered 2012-08-26 – 2012-08-28 (×6): 30 mg via INTRAVENOUS
  Filled 2012-08-26 (×9): qty 1

## 2012-08-26 NOTE — ED Notes (Signed)
Pt denies relief of pain, states, "I feel funny, I just don't feel right."  Alert and oriented x 4 at this time.  No neuro deficits noted.  edp notified and no new orders received at this time.

## 2012-08-26 NOTE — Consult Note (Signed)
NEURO HOSPITALIST CONSULT NOTE    Reason for Consult: Post traumatic SDH/SAH.  HPI:                                                                                                                                          Erik [name] is an 57 y.o. male with a past medical history significant for HTN, hyperlipidemia, PTSD, depression, alcohol abuse, severe TBI with removal of right brain acute SDH at [facility] 10 years ago, " balck out spells", transferred to [facility] for further management of a mild post traumatic left parietal SAH/SDH. Case was already discussed with neurosurgery by the [facility] ED physician and no need to further neurosurgical intervention at this time was recommended.  Erik [name] said that 2 days ago he was at home, stood up quickly, became very lightheaded and sweaty, collapsed to the floor, hit his head very head, and had transient loss of consciousness. He has been experiencing a severe, harp headache and dizziness ever since and thus presented to [facility] ED earlier today and had a CT brain that showed a small amount of subarachnoid blood within sulci at the high left parietal lobe and a small adjacent subdural hematoma 3 mm thick without mass effect. No intraparenchymal or intraventricular hemorrhage. He indicated that except for short term memory los and imbalance he has no other sequelae from prior brain surgery. Reports no history of GTC seizures but recent spells of blacking out preceded by lightheadedness and sweatiness after standing up. No new medications but said that had had issues with low blood pressure in the past.   Past Medical History  Diagnosis Date  . Head trauma   . Alcohol abuse   . Depression   . Abnormal stress echocardiogram   . Hypercholesterolemia   . Hypertension     Past Surgical History  Procedure Laterality Date  . Kidney surgery      >30 years ago  . Hernia repair    . Cardiac cath    . Brain surgery    .  Head injury surgery      Family History  Problem Relation Age of Onset  . Coronary artery disease Other      Social History:  reports that he has been smoking.  He does not have any smokeless tobacco history on file. He reports that  drinks alcohol. He reports that he uses illicit drugs (Marijuana).  No Known Allergies  MEDICATIONS:  I have reviewed the patient's current medications.   ROS:                                                                                                                                       History obtained from the patient  General ROS: negative for - chills, fatigue, fever, night sweats, weight gain or weight loss Psychological ROS: negative for - behavioral disorder, hallucinations, or suicidal ideation Ophthalmic ROS: negative for - blurry vision, double vision, eye pain or loss of vision ENT ROS: negative for - epistaxis, nasal discharge, oral lesions, sore throat, tinnitus or vertigo Allergy and Immunology ROS: negative for - hives or itchy/watery eyes Hematological and Lymphatic ROS: negative for - bleeding problems, bruising or swollen lymph nodes Endocrine ROS: negative for - galactorrhea, hair pattern changes, polydipsia/polyuria or temperature intolerance Respiratory ROS: negative for - cough, hemoptysis, shortness of breath or wheezing Cardiovascular ROS: negative for - chest pain, dyspnea on exertion, edema or irregular heartbeat Gastrointestinal ROS: negative for - abdominal pain, diarrhea, hematemesis, nausea/vomiting or stool incontinence Genito-Urinary ROS: negative for - dysuria, hematuria, incontinence or urinary frequency/urgency Musculoskeletal ROS: negative for - joint swelling or muscular weakness Neurological ROS: as noted in HPI Dermatological ROS: negative for rash and skin lesion changes      Physical exam:  pleasant male in no apparent distress but complaining of HA. Blood pressure 116/72, pulse 72, temperature 98.8 F (37.1 C), temperature source Oral, resp. rate 18, SpO2 95.00%. Head: normocephalic. Neck: supple, no bruits, no JVD. Cardiac: no murmurs. Lungs: clear. Abdomen: soft, no tender, no mass. Extremities: no edema.  Neurologic Examination:                                                                                                      Mental Status: Alert, oriented, thought content appropriate.  Speech fluent without evidence of aphasia.  Able to follow 3 step commands without difficulty. Cranial Nerves: II: Discs flat bilaterally; Visual fields grossly normal, pupils equal, round, reactive to light and accommodation III,IV, VI: ptosis not present, extra-ocular motions intact bilaterally V,VII: smile symmetric, facial light touch sensation normal bilaterally VIII: hearing normal bilaterally IX,X: gag reflex present XI: bilateral shoulder shrug XII: midline tongue extension Motor: Right : Upper extremity   5/5    Left:     Upper extremity   5/5  Lower extremity   5/5     Lower extremity   5/5 Tone and bulk:normal tone throughout; no atrophy noted  Sensory: Pinprick and light touch intact throughout, bilaterally Deep Tendon Reflexes:  1+ all over.  Plantars: Right: downgoing   Left: downgoing Cerebellar: normal finger-to-nose,  normal heel-to-shin test Gait: No ataxia. CV: pulses palpable throughout    Lab Results  Component Value Date/Time   CHOL 200 04/19/2010 12:14 AM    Results for orders placed during the hospital encounter of 08/26/12 (from the past 48 hour(s))  CBC WITH DIFFERENTIAL     Status: None   Collection Time    08/26/12  9:00 AM      Result Value Range   WBC 6.7  4.0 - 10.5 K/uL   RBC 4.59  4.22 - 5.81 MIL/uL   Hemoglobin 14.3  13.0 - 17.0 g/dL   HCT 14.7  82.9 - 56.2 %   MCV 91.1  78.0 - 100.0 fL   MCH 31.2  26.0 - 34.0 pg   MCHC 34.2  30.0  - 36.0 g/dL   RDW 13.0  86.5 - 78.4 %   Platelets 173  150 - 400 K/uL   Neutrophils Relative % 73  43 - 77 %   Neutro Abs 4.9  1.7 - 7.7 K/uL   Lymphocytes Relative 17  12 - 46 %   Lymphs Abs 1.1  0.7 - 4.0 K/uL   Monocytes Relative 9  3 - 12 %   Monocytes Absolute 0.6  0.1 - 1.0 K/uL   Eosinophils Relative 1  0 - 5 %   Eosinophils Absolute 0.1  0.0 - 0.7 K/uL   Basophils Relative 0  0 - 1 %   Basophils Absolute 0.0  0.0 - 0.1 K/uL  BASIC METABOLIC PANEL     Status: Abnormal   Collection Time    08/26/12  9:00 AM      Result Value Range   Sodium 133 (*) 135 - 145 mEq/L   Potassium 3.7  3.5 - 5.1 mEq/L   Chloride 96  96 - 112 mEq/L   CO2 30  19 - 32 mEq/L   Glucose, Bld 117 (*) 70 - 99 mg/dL   BUN 10  6 - 23 mg/dL   Creatinine, Ser 6.96  0.50 - 1.35 mg/dL   Calcium 9.0  8.4 - 29.5 mg/dL   GFR calc non Af Amer >90  >90 mL/min   GFR calc Af Amer >90  >90 mL/min   Comment:            The eGFR has been calculated     using the CKD EPI equation.     This calculation has not been     validated in all clinical     situations.     eGFR's persistently     <90 mL/min signify     possible Chronic Kidney Disease.  PROTIME-INR     Status: None   Collection Time    08/26/12  9:00 AM      Result Value Range   Prothrombin Time 13.2  11.6 - 15.2 seconds   INR 1.02  0.00 - 1.49  APTT     Status: None   Collection Time    08/26/12  9:00 AM      Result Value Range   aPTT 31  24 - 37 seconds    Ct Head Wo Contrast  08/26/2012   **ADDENDUM** CREATED: 08/26/2012 08:51:06  Omitted from initial dictation is presence of a nondisplaced left parietal skull fracture images 33 - 52.  **END ADDENDUM** SIGNED BY: Loraine Leriche A. Tyron Russell,  M.D.  08/26/2012   *RADIOLOGY REPORT*  Clinical Data:  Syncope 2 days ago, trauma, severe headache, neck pain, history of head injury 10 years ago  CT HEAD WITHOUT CONTRAST CT CERVICAL SPINE WITHOUT CONTRAST  Technique:  Multidetector CT imaging of the head and cervical spine  was performed following the standard protocol without intravenous contrast.  Multiplanar CT image reconstructions of the cervical spine were also generated.  Comparison:  CT head 08/08/2010  CT HEAD  Findings: Generalized atrophy. Normal ventricular morphology. No midline shift or mass effect. Subarachnoid blood identified within sulci at the high left parietal lobe. Small adjacent subdural hematoma 3 mm thick. No intraparenchymal or intraventricular hemorrhage. No evidence of acute infarction or mass lesion. Large area of encephalomalacia identified at the lateral aspects of the right temporal and to a lesser degree right frontal lobes consistent with history of remote trauma. Low attenuation also identified at the subfrontal regions bilaterally question post-traumatic as well. Bones sinuses unremarkable.  IMPRESSION: Generalized atrophy. Encephalomalacia at right temporal lobe, lateral right frontal lobe and at the subfrontal regions bilaterally consistent with history of remote trauma. Mild acute subarachnoid hemorrhage and small 3 mm thick subdural hematoma at high left parietal lobe.  CT CERVICAL SPINE  Findings: Osseous demineralization. Visualized skull base intact. Few scattered atherosclerotic calcifications of the left carotid system. Tips of lung apices clear. Prevertebral soft tissues normal thickness. Disc space narrowing with minimal endplate spur formation C3-C4 through C6-C7. Vertebral body heights maintained. No acute fracture, subluxation or bone destruction. Mild scattered uncovertebral spur formation  IMPRESSION: Scattered degenerative disc disease changes. No acute cervical spine abnormalities.  Critical Value/emergent results were called by telephone at the time of interpretation on 08/26/2012 at 0846 hours to Dr. Hyacinth Meeker, who verbally acknowledged these results.   Original Report Authenticated By: Ulyses Southward, M.D.   Ct Cervical Spine Wo Contrast  08/26/2012   **ADDENDUM** CREATED: 08/26/2012  08:51:06  Omitted from initial dictation is presence of a nondisplaced left parietal skull fracture images 33 - 52.  **END ADDENDUM** SIGNED BY: Loraine Leriche A. Tyron Russell, M.D.  08/26/2012   *RADIOLOGY REPORT*  Clinical Data:  Syncope 2 days ago, trauma, severe headache, neck pain, history of head injury 10 years ago  CT HEAD WITHOUT CONTRAST CT CERVICAL SPINE WITHOUT CONTRAST  Technique:  Multidetector CT imaging of the head and cervical spine was performed following the standard protocol without intravenous contrast.  Multiplanar CT image reconstructions of the cervical spine were also generated.  Comparison:  CT head 08/08/2010  CT HEAD  Findings: Generalized atrophy. Normal ventricular morphology. No midline shift or mass effect. Subarachnoid blood identified within sulci at the high left parietal lobe. Small adjacent subdural hematoma 3 mm thick. No intraparenchymal or intraventricular hemorrhage. No evidence of acute infarction or mass lesion. Large area of encephalomalacia identified at the lateral aspects of the right temporal and to a lesser degree right frontal lobes consistent with history of remote trauma. Low attenuation also identified at the subfrontal regions bilaterally question post-traumatic as well. Bones sinuses unremarkable.  IMPRESSION: Generalized atrophy. Encephalomalacia at right temporal lobe, lateral right frontal lobe and at the subfrontal regions bilaterally consistent with history of remote trauma. Mild acute subarachnoid hemorrhage and small 3 mm thick subdural hematoma at high left parietal lobe.  CT CERVICAL SPINE  Findings: Osseous demineralization. Visualized skull base intact. Few scattered atherosclerotic calcifications of the left carotid system. Tips of lung apices clear. Prevertebral soft tissues normal thickness. Disc space narrowing with minimal endplate spur  formation C3-C4 through C6-C7. Vertebral body heights maintained. No acute fracture, subluxation or bone destruction. Mild  scattered uncovertebral spur formation  IMPRESSION: Scattered degenerative disc disease changes. No acute cervical spine abnormalities.  Critical Value/emergent results were called by telephone at the time of interpretation on 08/26/2012 at 0846 hours to Dr. Hyacinth Meeker, who verbally acknowledged these results.   Original Report Authenticated By: Ulyses Southward, M.D.     Assessment/Plan: 57 y/o with small post traumatic left parietal SAH/SDH, normal mental status and non focal neurological examination. Recent " black out spells" that probably precipitated this falls. Concern this is probably related to postural hypotension and less likely atonic or partial complex seizures. However, he has a history of severe TBI requiring surgical evacuation of a right SDH and thus it is prudent to obtain EEG. Needs follow up CT brain in am, IV Depacon and Toradol for symptomatic HA control. Will follow up.  Wyatt Portela, MD [facility] [phone]  08/26/2012, 4:54 PM

## 2012-08-26 NOTE — Consult Note (Signed)
Reason for Consult:  Traumatic subarachnoid hemorrhage and subdural hematoma  Referring Physician:  Dr. Eber Hong  Erik [name] is an 57 y.o. white male.  HPI: Patient was transferred from [facility] for further workup of syncope. The patient has a history of a head injury about 10 years ago, managed at [facility]. Since then he has had episodic "blackout spells." He apparently underwent some workup somewhere in the past, that is unavailable to [place], and that he does not recall the details of. Patient had fewer of them, but has had to "blackout spells "over the past month. In both circumstances he got up from a chair and passed out.  This most recently occurred 2 days ago, he was sitting on his porch in the afternoon, he had had a beer earlier in the day, he got up, passed up, and the back of his head on a brick wall. Since then he has had diffuse headache, he used over the counter remedies without relief. He therefore went to [facility] ER and was evaluated by Dr. Eber Hong, the EDP, and CT scan of the brain was obtained, and revealed a small amount of left hemispheric traumatic subarachnoid hemorrhage and a thin small subdural hematoma, without mass effect.  Dr. Hyacinth Meeker requested neurosurgical consultation, I reviewed the patient's record and CT scan images. Dr. Hyacinth Meeker explained to the patient was neurologically intact, and therefore I felt that the best that the patient be admitted to a hospitalist service for workup of his syncope, since I do not anticipate the need for neurosurgical intervention, and I can see him as a Research scientist (medical), and follow along with the hospitalist. The patient has been transferred to the neurology hospitalist service at [facility].  The patient denies any clear-cut seizure activity, but he does have a history of atherosclerotic coronary vascular disease, hypertension, and hypercholesterolemia, and has undergone workup with Dr. Valera Castle  of [facility] cardiology, and his workup did include a cardiac catheterization. The results of the cardiology work up for not available to [place], but the patient reports that he only had a 20% occlusion of a coronary vessel. He also denies any motor deficit. He does describe some mild blurring of the vision, does not describe any double vision.  Past Medical History:  Past Medical History  Diagnosis Date  . Head trauma   . Alcohol abuse   . Depression   . Abnormal stress echocardiogram   . Hypercholesterolemia   . Hypertension     Past Surgical History:  Past Surgical History  Procedure Laterality Date  . Kidney surgery      >30 years ago  . Hernia repair    . Cardiac cath    . Brain surgery    . Head injury surgery      Family History:  Family History  Problem Relation Age of Onset  . Coronary artery disease Other     Social History:  reports that he has been smoking.  He does not have any smokeless tobacco history on file. He reports that  drinks alcohol. He reports that he uses illicit drugs (Marijuana).  Allergies: No Known Allergies  Medications: I have reviewed the patient's current medications.  Review of systems: Notable for those difficulties described in his history of present illness and past medical history, but is otherwise unremarkable.  Physical Examination: Well-developed, well-nourished white male in no acute distress. Blood pressure 116/72, pulse 72, temperature 98.8 F (37.1 C), temperature source Oral, resp.  rate 18, SpO2 95.00%. Lungs:  Clear to auscultation, symmetrical respiratory excursion. Heart:   Normal S1 and S2, no murmur. Abdomen:  Soft, nondistended, bowel sounds present.  Neurological Examination: Mental Status Examination:  Awake and alert, oriented to name, [facility], [place], and July 2014. Speech is fluent. He has good comprehension. Cranial Nerve Examination:  Pupils equal round and reactive to light, and about 3 mm  bilaterally. EOM I. Facial sensation intact. Facial movement symmetrical. Hearing present bilaterally. Palatal movement symmetrical. Shoulder shrug is symmetrical. Tongue midline. Motor Examination:  5/5 strength in the upper and lower extremities. No drift of the upper extremities. Sensory Examination:  Intact to pinprick in the extremities. Reflex Examination:   Symmetrical. Gait and Stance Examination:  Not tested due to the nature the patient's condition.   Results for orders placed during the hospital encounter of 08/26/12 (from the past 48 hour(s))  CBC WITH DIFFERENTIAL     Status: None   Collection Time    08/26/12  9:00 AM      Result Value Range   WBC 6.7  4.0 - 10.5 K/uL   RBC 4.59  4.22 - 5.81 MIL/uL   Hemoglobin 14.3  13.0 - 17.0 g/dL   HCT 40.9  81.1 - 91.4 %   MCV 91.1  78.0 - 100.0 fL   MCH 31.2  26.0 - 34.0 pg   MCHC 34.2  30.0 - 36.0 g/dL   RDW 78.2  95.6 - 21.3 %   Platelets 173  150 - 400 K/uL   Neutrophils Relative % 73  43 - 77 %   Neutro Abs 4.9  1.7 - 7.7 K/uL   Lymphocytes Relative 17  12 - 46 %   Lymphs Abs 1.1  0.7 - 4.0 K/uL   Monocytes Relative 9  3 - 12 %   Monocytes Absolute 0.6  0.1 - 1.0 K/uL   Eosinophils Relative 1  0 - 5 %   Eosinophils Absolute 0.1  0.0 - 0.7 K/uL   Basophils Relative 0  0 - 1 %   Basophils Absolute 0.0  0.0 - 0.1 K/uL  BASIC METABOLIC PANEL     Status: Abnormal   Collection Time    08/26/12  9:00 AM      Result Value Range   Sodium 133 (*) 135 - 145 mEq/L   Potassium 3.7  3.5 - 5.1 mEq/L   Chloride 96  96 - 112 mEq/L   CO2 30  19 - 32 mEq/L   Glucose, Bld 117 (*) 70 - 99 mg/dL   BUN 10  6 - 23 mg/dL   Creatinine, Ser 0.86  0.50 - 1.35 mg/dL   Calcium 9.0  8.4 - 57.8 mg/dL   GFR calc non Af Amer >90  >90 mL/min   GFR calc Af Amer >90  >90 mL/min   Comment:            The eGFR has been calculated     using the CKD EPI equation.     This calculation has not been     validated in all clinical     situations.      eGFR's persistently     <90 mL/min signify     possible Chronic Kidney Disease.  PROTIME-INR     Status: None   Collection Time    08/26/12  9:00 AM      Result Value Range   Prothrombin Time 13.2  11.6 - 15.2 seconds  INR 1.02  0.00 - 1.49  APTT     Status: None   Collection Time    08/26/12  9:00 AM      Result Value Range   aPTT 31  24 - 37 seconds    Ct Head Wo Contrast  08/26/2012   **ADDENDUM** CREATED: 08/26/2012 08:51:06  Omitted from initial dictation is presence of a nondisplaced left parietal skull fracture images 33 - 52.  **END ADDENDUM** SIGNED BY: Loraine Leriche A. Tyron Russell, M.D.  08/26/2012   *RADIOLOGY REPORT*  Clinical Data:  Syncope 2 days ago, trauma, severe headache, neck pain, history of head injury 10 years ago  CT HEAD WITHOUT CONTRAST CT CERVICAL SPINE WITHOUT CONTRAST  Technique:  Multidetector CT imaging of the head and cervical spine was performed following the standard protocol without intravenous contrast.  Multiplanar CT image reconstructions of the cervical spine were also generated.  Comparison:  CT head 08/08/2010  CT HEAD  Findings: Generalized atrophy. Normal ventricular morphology. No midline shift or mass effect. Subarachnoid blood identified within sulci at the high left parietal lobe. Small adjacent subdural hematoma 3 mm thick. No intraparenchymal or intraventricular hemorrhage. No evidence of acute infarction or mass lesion. Large area of encephalomalacia identified at the lateral aspects of the right temporal and to a lesser degree right frontal lobes consistent with history of remote trauma. Low attenuation also identified at the subfrontal regions bilaterally question post-traumatic as well. Bones sinuses unremarkable.  IMPRESSION: Generalized atrophy. Encephalomalacia at right temporal lobe, lateral right frontal lobe and at the subfrontal regions bilaterally consistent with history of remote trauma. Mild acute subarachnoid hemorrhage and small 3 mm thick subdural  hematoma at high left parietal lobe.  CT CERVICAL SPINE  Findings: Osseous demineralization. Visualized skull base intact. Few scattered atherosclerotic calcifications of the left carotid system. Tips of lung apices clear. Prevertebral soft tissues normal thickness. Disc space narrowing with minimal endplate spur formation C3-C4 through C6-C7. Vertebral body heights maintained. No acute fracture, subluxation or bone destruction. Mild scattered uncovertebral spur formation  IMPRESSION: Scattered degenerative disc disease changes. No acute cervical spine abnormalities.  Critical Value/emergent results were called by telephone at the time of interpretation on 08/26/2012 at 0846 hours to Dr. Hyacinth Meeker, who verbally acknowledged these results.   Original Report Authenticated By: Ulyses Southward, M.D.   Ct Cervical Spine Wo Contrast  08/26/2012   **ADDENDUM** CREATED: 08/26/2012 08:51:06  Omitted from initial dictation is presence of a nondisplaced left parietal skull fracture images 33 - 52.  **END ADDENDUM** SIGNED BY: Loraine Leriche A. Tyron Russell, M.D.  08/26/2012   *RADIOLOGY REPORT*  Clinical Data:  Syncope 2 days ago, trauma, severe headache, neck pain, history of head injury 10 years ago  CT HEAD WITHOUT CONTRAST CT CERVICAL SPINE WITHOUT CONTRAST  Technique:  Multidetector CT imaging of the head and cervical spine was performed following the standard protocol without intravenous contrast.  Multiplanar CT image reconstructions of the cervical spine were also generated.  Comparison:  CT head 08/08/2010  CT HEAD  Findings: Generalized atrophy. Normal ventricular morphology. No midline shift or mass effect. Subarachnoid blood identified within sulci at the high left parietal lobe. Small adjacent subdural hematoma 3 mm thick. No intraparenchymal or intraventricular hemorrhage. No evidence of acute infarction or mass lesion. Large area of encephalomalacia identified at the lateral aspects of the right temporal and to a lesser degree right  frontal lobes consistent with history of remote trauma. Low attenuation also identified at the subfrontal regions bilaterally question post-traumatic as  well. Bones sinuses unremarkable.  IMPRESSION: Generalized atrophy. Encephalomalacia at right temporal lobe, lateral right frontal lobe and at the subfrontal regions bilaterally consistent with history of remote trauma. Mild acute subarachnoid hemorrhage and small 3 mm thick subdural hematoma at high left parietal lobe.  CT CERVICAL SPINE  Findings: Osseous demineralization. Visualized skull base intact. Few scattered atherosclerotic calcifications of the left carotid system. Tips of lung apices clear. Prevertebral soft tissues normal thickness. Disc space narrowing with minimal endplate spur formation C3-C4 through C6-C7. Vertebral body heights maintained. No acute fracture, subluxation or bone destruction. Mild scattered uncovertebral spur formation  IMPRESSION: Scattered degenerative disc disease changes. No acute cervical spine abnormalities.  Critical Value/emergent results were called by telephone at the time of interpretation on 08/26/2012 at 0846 hours to Dr. Hyacinth Meeker, who verbally acknowledged these results.   Original Report Authenticated By: Ulyses Southward, M.D.     Assessment/Plan: Patient with recurring "blackout spells" consistent with syncope. The cause of the syncope is uncertain, but he does have a history of a head injury 10 years ago. Patient sustained a minor head injury, with a brief loss of consciousness, associated with his most recent syncopal episode over 2 days ago. He's been having headaches since. CT scan this morning revealed a small amount of traumatic subarachnoid hemorrhage and a small subdural hematoma. The radiologist has added to his report that the patient has a nondisplaced of fracture overlying this area.  I do not see any need for neurosurgical intervention at this time, but explained to patient that I think it's important  that the cause of his syncope be determined so as to try and reduce the risk of further "blackout spells," and thereby reduce the risk of other injury to this patient.  I've recommended a repeat CT of the brain without contrast in 2 days (08/28/2012) for followup of the traumatic subarachnoid hemorrhage and subdural hematoma.  Hewitt Shorts, MD 08/26/2012, 4:51 PM

## 2012-08-26 NOTE — ED Provider Notes (Signed)
History    This chart was scribed for Erik Roller, MD, by Yevette Edwards, ED Scribe. This patient was seen in room APA18/AP[facility]18 and the patient's care was started at 7:55 AM.  CSN: [id number] Arrival date & time 08/26/12  0745  First MD Initiated Contact with Patient 08/26/12 [phone]     Chief Complaint  Patient presents with  . Headache  . Loss of Consciousness    The history is provided by the patient. No language interpreter was used.   HPI Comments: Erik [name] is a 57 y.o. male, with a h/o of head trauma, who presents to the Emergency Department complaining of an un-witnessed LOC. It occurred two days ago when he fell and possibly struck his head against a brick wall. His initial head injury occurred ten years ago when he fell upon a concrete floor; he was hospitalized for six weeks and received brain surgery. The pt reports that he has suffered recurrent episodes of LOC since that initial incident. Concerning this episode of LOC, he states that he felt light-headed and dizzy before this recent episode of LOC. After the LOC, he reports that he felt nauseated, has had neck pain with ROM of his head, and has experienced headaches . He has treated his headache with aspirin and tylenol, but without resolution. He also states he has had difficulty ambulating due to intermittent blurred vision and dizziness. He reports that he had drunk a few beers earlier in the day. The pt has a h/o of alcohol abuse and HTN. He is a daily smoker, and he uses alcohol.   Past Medical History  Diagnosis Date  . Head trauma   . Alcohol abuse   . Depression   . Abnormal stress echocardiogram   . Hypercholesterolemia   . Hypertension    Past Surgical History  Procedure Laterality Date  . Kidney surgery      >30 years ago  . Hernia repair    . Cardiac cath    . Brain surgery    . Head injury surgery     Family History  Problem Relation Age of Onset  . Coronary artery disease Other    History   Substance Use Topics  . Smoking status: Current Every Day Smoker  . Smokeless tobacco: Not on file     Comment: QUIT 6-7 WEEKS AGO  . Alcohol Use: Yes     Comment: beer occ    Review of Systems  A complete 10 system review of systems was obtained, and all systems were negative except where indicated in the HPI and PE.   Allergies  Review of patient's allergies indicates no known allergies.  Home Medications   Current Outpatient Rx  Name  Route  Sig  Dispense  Refill  . acetaminophen (TYLENOL) 325 MG tablet   Oral   Take 650 mg by mouth every 6 (six) hours as needed for pain.         Marland Kitchen aspirin 81 MG tablet   Oral   Take 81 mg by mouth daily.           . citalopram (CELEXA) 20 MG tablet   Oral   Take 20 mg by mouth daily.         Marland Kitchen esomeprazole (NEXIUM) 40 MG capsule   Oral   Take 40 mg by mouth daily before breakfast.         . metoprolol succinate (TOPROL-XL) 25 MG 24 hr tablet   Oral  Take 1 tablet (25 mg total) by mouth daily.   90 tablet   3   . rosuvastatin (CRESTOR) 20 MG tablet   Oral   Take 1 tablet (20 mg total) by mouth daily.   90 tablet   3    Triage Vitals: BP 110/83  Pulse 88  Temp(Src) 98 F (36.7 C) (Oral)  Resp 20  SpO2 96%  Physical Exam  Nursing note and vitals reviewed. Constitutional: He appears well-developed and well-nourished. No distress.  HENT:  Head: Normocephalic and atraumatic.  Mouth/Throat: Oropharynx is clear and moist. No oropharyngeal exudate.  Scabbed abrasion to posterior occiput.   Eyes: Conjunctivae and EOM are normal. Pupils are equal, round, and reactive to light. Right eye exhibits no discharge. Left eye exhibits no discharge. No scleral icterus.  Neck: Normal range of motion. Neck supple. No JVD present. No thyromegaly present.  Cardiovascular: Normal rate, regular rhythm, normal heart sounds and intact distal pulses.  Exam reveals no gallop and no friction rub.   No murmur heard. Pulmonary/Chest:  Effort normal and breath sounds normal. No respiratory distress. He has no wheezes. He has no rales.  Abdominal: Soft. Bowel sounds are normal. He exhibits no distension and no mass. There is no tenderness.  Small bruise to left abdominal wall.   Musculoskeletal: Normal range of motion. He exhibits no edema and no tenderness.  Straight leg raise bilaterally.   Lymphadenopathy:    He has no cervical adenopathy.  Neurological: He is alert. Coordination normal.  Normal sensation to light touch and pinprick. Normal strength to all four extremities.  Speech is clear.  No limb ataxia.   Skin: Skin is warm and dry. No rash noted. No erythema.  Psychiatric: He has a normal mood and affect. His behavior is normal.    ED Course  Procedures (including critical care time)  DIAGNOSTIC STUDIES: Oxygen Saturation is 96% on room air, normal by my interpretation.    COORDINATION OF CARE:  8:00 AM-Discussed treatment plan with patient, and the patient agreed to the plan.    Labs Reviewed  BASIC METABOLIC PANEL - Abnormal; Notable for the following:    Sodium 133 (*)    Glucose, Bld 117 (*)    All other components within normal limits  CBC WITH DIFFERENTIAL  PROTIME-INR  APTT   Ct Head Wo Contrast  08/26/2012   **ADDENDUM** CREATED: 08/26/2012 08:51:06  Omitted from initial dictation is presence of a nondisplaced left parietal skull fracture images 33 - 52.  **END ADDENDUM** SIGNED BY: Loraine Leriche A. Tyron Russell, M.D.  08/26/2012   *RADIOLOGY REPORT*  Clinical Data:  Syncope 2 days ago, trauma, severe headache, neck pain, history of head injury 10 years ago  CT HEAD WITHOUT CONTRAST CT CERVICAL SPINE WITHOUT CONTRAST  Technique:  Multidetector CT imaging of the head and cervical spine was performed following the standard protocol without intravenous contrast.  Multiplanar CT image reconstructions of the cervical spine were also generated.  Comparison:  CT head 08/08/2010  CT HEAD  Findings: Generalized atrophy.  Normal ventricular morphology. No midline shift or mass effect. Subarachnoid blood identified within sulci at the high left parietal lobe. Small adjacent subdural hematoma 3 mm thick. No intraparenchymal or intraventricular hemorrhage. No evidence of acute infarction or mass lesion. Large area of encephalomalacia identified at the lateral aspects of the right temporal and to a lesser degree right frontal lobes consistent with history of remote trauma. Low attenuation also identified at the subfrontal regions bilaterally question post-traumatic as  well. Bones sinuses unremarkable.  IMPRESSION: Generalized atrophy. Encephalomalacia at right temporal lobe, lateral right frontal lobe and at the subfrontal regions bilaterally consistent with history of remote trauma. Mild acute subarachnoid hemorrhage and small 3 mm thick subdural hematoma at high left parietal lobe.  CT CERVICAL SPINE  Findings: Osseous demineralization. Visualized skull base intact. Few scattered atherosclerotic calcifications of the left carotid system. Tips of lung apices clear. Prevertebral soft tissues normal thickness. Disc space narrowing with minimal endplate spur formation C3-C4 through C6-C7. Vertebral body heights maintained. No acute fracture, subluxation or bone destruction. Mild scattered uncovertebral spur formation  IMPRESSION: Scattered degenerative disc disease changes. No acute cervical spine abnormalities.  Critical Value/emergent results were called by telephone at the time of interpretation on 08/26/2012 at 0846 hours to Dr. Hyacinth Meeker, who verbally acknowledged these results.   Original Report Authenticated By: Ulyses Southward, M.D.   Ct Cervical Spine Wo Contrast  08/26/2012   **ADDENDUM** CREATED: 08/26/2012 08:51:06  Omitted from initial dictation is presence of a nondisplaced left parietal skull fracture images 33 - 52.  **END ADDENDUM** SIGNED BY: Loraine Leriche A. Tyron Russell, M.D.  08/26/2012   *RADIOLOGY REPORT*  Clinical Data:  Syncope 2 days  ago, trauma, severe headache, neck pain, history of head injury 10 years ago  CT HEAD WITHOUT CONTRAST CT CERVICAL SPINE WITHOUT CONTRAST  Technique:  Multidetector CT imaging of the head and cervical spine was performed following the standard protocol without intravenous contrast.  Multiplanar CT image reconstructions of the cervical spine were also generated.  Comparison:  CT head 08/08/2010  CT HEAD  Findings: Generalized atrophy. Normal ventricular morphology. No midline shift or mass effect. Subarachnoid blood identified within sulci at the high left parietal lobe. Small adjacent subdural hematoma 3 mm thick. No intraparenchymal or intraventricular hemorrhage. No evidence of acute infarction or mass lesion. Large area of encephalomalacia identified at the lateral aspects of the right temporal and to a lesser degree right frontal lobes consistent with history of remote trauma. Low attenuation also identified at the subfrontal regions bilaterally question post-traumatic as well. Bones sinuses unremarkable.  IMPRESSION: Generalized atrophy. Encephalomalacia at right temporal lobe, lateral right frontal lobe and at the subfrontal regions bilaterally consistent with history of remote trauma. Mild acute subarachnoid hemorrhage and small 3 mm thick subdural hematoma at high left parietal lobe.  CT CERVICAL SPINE  Findings: Osseous demineralization. Visualized skull base intact. Few scattered atherosclerotic calcifications of the left carotid system. Tips of lung apices clear. Prevertebral soft tissues normal thickness. Disc space narrowing with minimal endplate spur formation C3-C4 through C6-C7. Vertebral body heights maintained. No acute fracture, subluxation or bone destruction. Mild scattered uncovertebral spur formation  IMPRESSION: Scattered degenerative disc disease changes. No acute cervical spine abnormalities.  Critical Value/emergent results were called by telephone at the time of interpretation on  08/26/2012 at 0846 hours to Dr. Hyacinth Meeker, who verbally acknowledged these results.   Original Report Authenticated By: Ulyses Southward, M.D.   1. SDH (subdural hematoma)   2. Skull fracture, closed, initial encounter   3. SAH (subarachnoid hemorrhage)     MDM  ED ECG REPORT  I personally interpreted this EKG   Date: 08/26/2012   Rate: 89  Rhythm: normal sinus rhythm  QRS Axis: left  Intervals: normal  ST/T Wave abnormalities: normal  Conduction Disutrbances:none  Narrative Interpretation:   Old EKG Reviewed: c/w 08/08/12, no sig changes   The pt has had ongoing headache for 2 days since fall - has had nausea and blurred  vision.  Thankfully has not been vomiting.    I have personally seen and interpretted the CT scans of the brain and have discussed the findings with the radiologist to include  1.  Skull fracture 2.  Sub Dural hematoma 3.  Sub Arachnoid hematoma  He is crticially ill with this bleeding and will need neurosurgical evaluation.  There is no neurosurgeon at this hospital - I will consult with nearest NS at [facility] hospital.    Pt informed of results - has been given multiple doses of pain medicine - dilaudid IV and has some mild improvement in his sx.  No seizure activity.  Pt will remain on cardiac monitoring.  I have not seen a downturn in his clinical condition while in the ED.  Discussed with Dr. Leroy Kennedy who agrees with evaluating the patient as a Research scientist (medical). Pending hospitalist consultation at this time. 9:30 AM  Discussed with hospitalist who will refer to the neurologist. Dr. Leroy Kennedy recontacted and will admit the patient to a neurologic step down bed.  CRITICAL CARE Performed by: Erik [name] Total critical care time: 35 Critical care time was exclusive of separately billable procedures and treating other patients. Critical care was necessary to treat or prevent imminent or life-threatening deterioration. Critical care was time spent personally by me on the following  activities: development of treatment plan with patient and/or surrogate as well as nursing, discussions with consultants, evaluation of patient's response to treatment, examination of patient, obtaining history from patient or surrogate, ordering and performing treatments and interventions, ordering and review of laboratory studies, ordering and review of radiographic studies, pulse oximetry and re-evaluation of patient's condition.   I personally performed the services described in this documentation, which was scribed in my presence. The recorded information has been reviewed and is accurate.       Erik Roller, MD 08/26/12 1000

## 2012-08-26 NOTE — ED Notes (Signed)
Pt reports has history of head injury and has "black out spells."  Reports Saturday he passed out and hit head against brick wall.  C/O severe headache since then.  Also reports blurred vision.

## 2012-08-26 NOTE — ED Notes (Signed)
[facility] requesting more pain meds prior to being transported for pt.  edp notified and orders received.

## 2012-08-26 NOTE — ED Notes (Signed)
[facility] was called to page Hospitalist at [facility] after Dr. Hyacinth Meeker spoke with Neurosurgery.

## 2012-08-27 ENCOUNTER — Inpatient Hospital Stay (HOSPITAL_COMMUNITY): Payer: Self-pay

## 2012-08-27 DIAGNOSIS — I6200 Nontraumatic subdural hemorrhage, unspecified: Secondary | ICD-10-CM

## 2012-08-27 MED ORDER — AMITRIPTYLINE HCL 10 MG PO TABS
10.0000 mg | ORAL_TABLET | Freq: Every day | ORAL | Status: DC
  Administered 2012-08-27 – 2012-08-30 (×4): 10 mg via ORAL
  Filled 2012-08-27 (×6): qty 1

## 2012-08-27 MED ORDER — SODIUM CHLORIDE 0.9 % IJ SOLN
INTRAMUSCULAR | Status: AC
  Administered 2012-08-27: 10 mL
  Filled 2012-08-27: qty 10

## 2012-08-27 MED ORDER — BISACODYL 10 MG RE SUPP: 10.0000 mg | Freq: Once | RECTAL | Status: DC

## 2012-08-27 NOTE — Progress Notes (Addendum)
NEURO HOSPITALIST PROGRESS NOTE   SUBJECTIVE:                                                                                                                        Persistent HA. Said that the HA medication help a little bit. No other neurological complains. CT brain today showed stable appearance of subarachnoid hemorrhage at the left parietal region and overall stable focal extra-axial collection left parietal region. EEG pending. On dilaudid, depacon, and toradol.  OBJECTIVE:                                                                                                                           Vital signs in last 24 hours: Temp:  [98.1 F (36.7 C)-98.8 F (37.1 C)] 98.5 F (36.9 C) (07/22 0747) Pulse Rate:  [63-88] 88 (07/22 0700) Resp:  [10-20] 19 (07/22 0700) BP: (116-144)/(64-89) 137/72 mmHg (07/22 0700) SpO2:  [90 %-97 %] 92 % (07/22 0700) Weight:  [107.7 kg (237 lb 7 oz)] 107.7 kg (237 lb 7 oz) (07/21 1600)  Intake/Output from previous day: 07/21 0701 - 07/22 0700 In: 1080 [P.O.:480; I.V.:545; IV Piggyback:55] Out: 300 [Urine:300] Intake/Output this shift:   Nutritional status: General  Past Medical History  Diagnosis Date  . Head trauma   . Alcohol abuse   . Depression   . Abnormal stress echocardiogram   . Hypercholesterolemia   . Hypertension     Neurologic Exam:  Mental Status:  Alert, oriented, thought content appropriate. Speech fluent without evidence of aphasia. Able to follow 3 step commands without difficulty.  Cranial Nerves:  II: Discs flat bilaterally; Visual fields grossly normal, pupils equal, round, reactive to light and accommodation  III,IV, VI: ptosis not present, extra-ocular motions intact bilaterally  V,VII: smile symmetric, facial light touch sensation normal bilaterally  VIII: hearing normal bilaterally  IX,X: gag reflex present  XI: bilateral shoulder shrug  XII: midline tongue extension   Motor:  Right : Upper extremity 5/5 Left: Upper extremity 5/5  Lower extremity 5/5 Lower extremity 5/5  Tone and bulk:normal tone throughout; no atrophy noted  Sensory: Pinprick and light touch intact throughout, bilaterally  Deep Tendon Reflexes:  1+ all over.  Plantars:  Right: downgoing Left: downgoing  Cerebellar:  normal finger-to-nose, normal heel-to-shin test  Gait:  No ataxia.  CV: pulses palpable throughout    Lab Results: Lab Results  Component Value Date/Time   CHOL 200 04/19/2010 12:14 AM   Lipid Panel No results found for this basename: CHOL, TRIG, HDL, CHOLHDL, VLDL, LDLCALC,  in the last 72 hours  Studies/Results: Ct Head Wo Contrast  08/27/2012   *RADIOLOGY REPORT*  Clinical Data:  Follow up intracranial hemorrhage post trauma  CT HEAD WITHOUT CONTRAST  Technique:  Contiguous axial images were obtained from the base of the skull through the vertex without contrast.  Comparison: 08/26/2012  Findings: Generalized atrophy. Stable ventricular morphology. No midline shift or mass effect. Large areas of encephalomalacia at the right temporal and lateral right frontal lobes as well as the subfrontal regions compatible with remote infarcts. High attenuation acute subarachnoid blood identified at the sulci in the high left parietal region and questionably at the subfrontal region. Additionally, question small subdural hematoma versus more pronounced subarachnoid blood at the left parietal region similar to the previous exam. No intraparenchymal hemorrhage or evidence of acute infarction identified. Left parietal skull fracture is better visualized on previous exam. Paranasal sinuses and mastoid air cells clear.  IMPRESSION: Atrophy with old infarcts as above. Stable appearance of subarachnoid hemorrhage at the left parietal region with questionably small mild subarachnoid blood at the subfrontal region. More focal extra-axial fluid collection at the left parietal region may  represent a small subdural hematoma or more pronounced subarachnoid blood.   Original Report Authenticated By: Ulyses Southward, M.D.   Ct Head Wo Contrast  08/26/2012   **ADDENDUM** CREATED: 08/26/2012 08:51:06  Omitted from initial dictation is presence of a nondisplaced left parietal skull fracture images 33 - 52.  **END ADDENDUM** SIGNED BY: Loraine Leriche A. Tyron Russell, M.D.  08/26/2012   *RADIOLOGY REPORT*  Clinical Data:  Syncope 2 days ago, trauma, severe headache, neck pain, history of head injury 10 years ago  CT HEAD WITHOUT CONTRAST CT CERVICAL SPINE WITHOUT CONTRAST  Technique:  Multidetector CT imaging of the head and cervical spine was performed following the standard protocol without intravenous contrast.  Multiplanar CT image reconstructions of the cervical spine were also generated.  Comparison:  CT head 08/08/2010  CT HEAD  Findings: Generalized atrophy. Normal ventricular morphology. No midline shift or mass effect. Subarachnoid blood identified within sulci at the high left parietal lobe. Small adjacent subdural hematoma 3 mm thick. No intraparenchymal or intraventricular hemorrhage. No evidence of acute infarction or mass lesion. Large area of encephalomalacia identified at the lateral aspects of the right temporal and to a lesser degree right frontal lobes consistent with history of remote trauma. Low attenuation also identified at the subfrontal regions bilaterally question post-traumatic as well. Bones sinuses unremarkable.  IMPRESSION: Generalized atrophy. Encephalomalacia at right temporal lobe, lateral right frontal lobe and at the subfrontal regions bilaterally consistent with history of remote trauma. Mild acute subarachnoid hemorrhage and small 3 mm thick subdural hematoma at high left parietal lobe.  CT CERVICAL SPINE  Findings: Osseous demineralization. Visualized skull base intact. Few scattered atherosclerotic calcifications of the left carotid system. Tips of lung apices clear. Prevertebral soft  tissues normal thickness. Disc space narrowing with minimal endplate spur formation C3-C4 through C6-C7. Vertebral body heights maintained. No acute fracture, subluxation or bone destruction. Mild scattered uncovertebral spur formation  IMPRESSION: Scattered degenerative disc disease changes. No acute cervical spine abnormalities.  Critical Value/emergent results were called by telephone at the time of interpretation on  08/26/2012 at 0846 hours to Dr. Hyacinth Meeker, who verbally acknowledged these results.   Original Report Authenticated By: Ulyses Southward, M.D.   Ct Cervical Spine Wo Contrast  08/26/2012   **ADDENDUM** CREATED: 08/26/2012 08:51:06  Omitted from initial dictation is presence of a nondisplaced left parietal skull fracture images 33 - 52.  **END ADDENDUM** SIGNED BY: Loraine Leriche A. Tyron Russell, M.D.  08/26/2012   *RADIOLOGY REPORT*  Clinical Data:  Syncope 2 days ago, trauma, severe headache, neck pain, history of head injury 10 years ago  CT HEAD WITHOUT CONTRAST CT CERVICAL SPINE WITHOUT CONTRAST  Technique:  Multidetector CT imaging of the head and cervical spine was performed following the standard protocol without intravenous contrast.  Multiplanar CT image reconstructions of the cervical spine were also generated.  Comparison:  CT head 08/08/2010  CT HEAD  Findings: Generalized atrophy. Normal ventricular morphology. No midline shift or mass effect. Subarachnoid blood identified within sulci at the high left parietal lobe. Small adjacent subdural hematoma 3 mm thick. No intraparenchymal or intraventricular hemorrhage. No evidence of acute infarction or mass lesion. Large area of encephalomalacia identified at the lateral aspects of the right temporal and to a lesser degree right frontal lobes consistent with history of remote trauma. Low attenuation also identified at the subfrontal regions bilaterally question post-traumatic as well. Bones sinuses unremarkable.  IMPRESSION: Generalized atrophy. Encephalomalacia at  right temporal lobe, lateral right frontal lobe and at the subfrontal regions bilaterally consistent with history of remote trauma. Mild acute subarachnoid hemorrhage and small 3 mm thick subdural hematoma at high left parietal lobe.  CT CERVICAL SPINE  Findings: Osseous demineralization. Visualized skull base intact. Few scattered atherosclerotic calcifications of the left carotid system. Tips of lung apices clear. Prevertebral soft tissues normal thickness. Disc space narrowing with minimal endplate spur formation C3-C4 through C6-C7. Vertebral body heights maintained. No acute fracture, subluxation or bone destruction. Mild scattered uncovertebral spur formation  IMPRESSION: Scattered degenerative disc disease changes. No acute cervical spine abnormalities.  Critical Value/emergent results were called by telephone at the time of interpretation on 08/26/2012 at 0846 hours to Dr. Hyacinth Meeker, who verbally acknowledged these results.   Original Report Authenticated By: Ulyses Southward, M.D.    MEDICATIONS                                                                                                                       I have reviewed the patient's current medications.  ASSESSMENT/PLAN:  57 y/o with small post traumatic left parietal SAH/SDH, normal mental status and non focal neurological examination.  Recent " black out spells": awaiting EEG. Persistent HA most likely as a component of post concussive syndrome plus small amount of subarachnoid blood. Will add 10 mg amitriptyline tonight.     Wyatt Portela, MD [facility] [phone]  08/27/2012, 8:27 AM

## 2012-08-27 NOTE — Progress Notes (Signed)
Utilization review completed.  

## 2012-08-27 NOTE — Procedures (Signed)
EEG report.  Brief clinical history:56 y/o with small post traumatic left parietal SAH/SDH, normal mental status and non focal neurological examination. Recent " black out spells" that probably precipitated this falls.    Technique: this is a 17 channel routine scalp EEG performed at the bedside with bipolar and monopolar montages arranged in accordance to the international 10/20 system of electrode placement. One channel was dedicated to EKG recording.  The study was performed during wakefulness, drowsiness, and stage 2 sleep. No activating procedures performed.  Description:In the wakeful state, the best background consisted of a medium amplitude, posterior dominant, well sustained, symmetric and reactive 11 Hz rhythm. Drowsiness demonstrated dropout of the alpha rhythm. Stage 2 sleep showed symmetric and synchronous sleep spindles without intermixed epileptiform discharges. No focal or generalized epileptiform discharges noted.  No slowing seen.  EKG showed sinus rhythm.  Impression: this is a normal awake and asleep EEG. Please, be aware that a normal EEG does not exclude the possibility of epilepsy.  Clinical correlation is advised.  Wyatt Portela, MD

## 2012-08-27 NOTE — Progress Notes (Signed)
Portable EEG completed

## 2012-08-27 NOTE — Progress Notes (Signed)
Pt has not voided since before coming into [facility] this am.  Pt has no urge to void, bladder scan shows 183cc.  No running IVF and pt has been NPO.  Dr. Thad Ranger notified of above.  New orders received, will continue to monitor.  Roselie Awkward, RN

## 2012-08-28 DIAGNOSIS — I609 Nontraumatic subarachnoid hemorrhage, unspecified: Secondary | ICD-10-CM

## 2012-08-28 DIAGNOSIS — S0280XA Fracture of other specified skull and facial bones, unspecified side, initial encounter for closed fracture: Secondary | ICD-10-CM

## 2012-08-28 MED ORDER — HYDROMORPHONE HCL PF 1 MG/ML IJ SOLN
2.0000 mg | INTRAMUSCULAR | Status: DC | PRN
  Administered 2012-08-28 – 2012-08-30 (×7): 2 mg via INTRAVENOUS
  Filled 2012-08-28 (×2): qty 2
  Filled 2012-08-28: qty 1
  Filled 2012-08-28 (×2): qty 2
  Filled 2012-08-28: qty 1
  Filled 2012-08-28: qty 2
  Filled 2012-08-28 (×2): qty 1

## 2012-08-28 MED ORDER — PANTOPRAZOLE SODIUM 40 MG PO TBEC
40.0000 mg | DELAYED_RELEASE_TABLET | Freq: Every day | ORAL | Status: DC
  Administered 2012-08-28 – 2012-08-31 (×4): 40 mg via ORAL
  Filled 2012-08-28 (×5): qty 1

## 2012-08-28 MED ORDER — HYDROCODONE-ACETAMINOPHEN 10-325 MG PO TABS: 1.0000 | ORAL_TABLET | ORAL | Status: DC | PRN

## 2012-08-28 MED ORDER — HYDROCODONE-ACETAMINOPHEN 5-325 MG PO TABS
1.0000 | ORAL_TABLET | ORAL | Status: DC | PRN
  Administered 2012-08-28 – 2012-08-31 (×8): 1 via ORAL
  Filled 2012-08-28 (×8): qty 1

## 2012-08-28 NOTE — Progress Notes (Signed)
Patient transferred to 4N. All belongings sent with patient. IVF infusing. Meds current. VSS. RN to transfer patient via wheelchair. Will continue to monitor.

## 2012-08-28 NOTE — Progress Notes (Signed)
Subjective: Patient resting in bed, complaining of headache. Followup CT scan of the brain showed no significant change in a small amount of left hemispheric traumatic subarachnoid hemorrhage and subdural hematoma. These do not cause any significant mass effect.  Continue to undergo workup for recurrent syncope. EEG did not show evidence of seizure activity.  Objective: Vital signs in last 24 hours: Filed Vitals:   08/28/12 0600 08/28/12 0700 08/28/12 0729 08/28/12 0801  BP: 125/76 118/68  130/77  Pulse: 60 63    Temp:   98.6 F (37 C)   TempSrc:   Oral   Resp: 17 19    Height:      Weight:      SpO2: 90% 92%  91%    Intake/Output from previous day: 07/22 0701 - 07/23 0700 In: 2280 [P.O.:1080; I.V.:1150; IV Piggyback:50] Out: 700 [Urine:700] Intake/Output this shift:    Physical Exam:  Awake alert, oriented to name, [facility] hospital, and July 2014. Pupils equal round and reactive to light. EOMI. Facial movements symmetrical. Tongue midline. Moving all 4 extremities well. No drift of upper extremities.   CBC  Recent Labs  08/26/12 0900  WBC 6.7  HGB 14.3  HCT 41.8  PLT 173   BMET  Recent Labs  08/26/12 0900  NA 133*  K 3.7  CL 96  CO2 30  GLUCOSE 117*  BUN 10  CREATININE 0.92  CALCIUM 9.0    Studies/Results: Ct Head Wo Contrast  08/27/2012   *RADIOLOGY REPORT*  Clinical Data:  Follow up intracranial hemorrhage post trauma  CT HEAD WITHOUT CONTRAST  Technique:  Contiguous axial images were obtained from the base of the skull through the vertex without contrast.  Comparison: 08/26/2012  Findings: Generalized atrophy. Stable ventricular morphology. No midline shift or mass effect. Large areas of encephalomalacia at the right temporal and lateral right frontal lobes as well as the subfrontal regions compatible with remote infarcts. High attenuation acute subarachnoid blood identified at the sulci in the high left parietal region and questionably at the  subfrontal region. Additionally, question small subdural hematoma versus more pronounced subarachnoid blood at the left parietal region similar to the previous exam. No intraparenchymal hemorrhage or evidence of acute infarction identified. Left parietal skull fracture is better visualized on previous exam. Paranasal sinuses and mastoid air cells clear.  IMPRESSION: Atrophy with old infarcts as above. Stable appearance of subarachnoid hemorrhage at the left parietal region with questionably small mild subarachnoid blood at the subfrontal region. More focal extra-axial fluid collection at the left parietal region may represent a small subdural hematoma or more pronounced subarachnoid blood.   Original Report Authenticated By: Ulyses Southward, M.D.    Assessment/Plan: Stable mild closed head injury in a patient with recurrent syncope. Neurologically stable. CT scan stable.  At this time do not see indication for neurosurgical intervention. He will need outpatient neurology followup for both his syncope and headache. I do not anticipate a need for neurosurgical followup following discharge.   Hewitt Shorts, MD 08/28/2012, 10:33 AM

## 2012-08-28 NOTE — Progress Notes (Signed)
Subjective: Complaining of continued severe headache. No seizure activity reported. No changes in mental status reported. Headache has not improved with Toradol and Depacon.  Objective: Current vital signs: BP 130/77  Pulse 63  Temp(Src) 98.6 F (37 C) (Oral)  Resp 19  Ht 6\' 2"  (1.88 m)  Wt 107.7 kg (237 lb 7 oz)  BMI 30.47 kg/m2  SpO2 91%  Neurologic Exam: Alert and complaining of severe headache. Vital signs stable, including a pulse rate in the low 60s. Patient is well-oriented to time as well as place. Pupils were equal and reacted normally to light. Extraocular movements were full and conjugate. Visual fields are intact and normal. No facial weakness was noted. Speech was normal. Moved extremities equally with good strength throughout.  Lab Results: Results for orders placed during the hospital encounter of 08/26/12 (from the past 48 hour(s))  MRSA PCR SCREENING     Status: None   Collection Time    08/26/12  4:25 PM      Result Value Range   MRSA by PCR NEGATIVE  NEGATIVE   Comment:            The GeneXpert MRSA Assay (FDA     approved for NASAL specimens     only), is one component of a     comprehensive MRSA colonization     surveillance program. It is not     intended to diagnose MRSA     infection nor to guide or     monitor treatment for     MRSA infections.    Studies/Results: Ct Head Wo Contrast  08/27/2012   *RADIOLOGY REPORT*  Clinical Data:  Follow up intracranial hemorrhage post trauma  CT HEAD WITHOUT CONTRAST  Technique:  Contiguous axial images were obtained from the base of the skull through the vertex without contrast.  Comparison: 08/26/2012  Findings: Generalized atrophy. Stable ventricular morphology. No midline shift or mass effect. Large areas of encephalomalacia at the right temporal and lateral right frontal lobes as well as the subfrontal regions compatible with remote infarcts. High attenuation acute subarachnoid blood identified at the  sulci in the high left parietal region and questionably at the subfrontal region. Additionally, question small subdural hematoma versus more pronounced subarachnoid blood at the left parietal region similar to the previous exam. No intraparenchymal hemorrhage or evidence of acute infarction identified. Left parietal skull fracture is better visualized on previous exam. Paranasal sinuses and mastoid air cells clear.  IMPRESSION: Atrophy with old infarcts as above. Stable appearance of subarachnoid hemorrhage at the left parietal region with questionably small mild subarachnoid blood at the subfrontal region. More focal extra-axial fluid collection at the left parietal region may represent a small subdural hematoma or more pronounced subarachnoid blood.   Original Report Authenticated By: Ulyses Southward, M.D.    Medications: I have reviewed the patient's current medications.  Assessment/Plan: 57 year old man with history of traumatic brain injury presenting with fall and acute left frontal and parietal subarachnoid hemorrhage and subdural hemorrhage as well in the parietal region. Patient has skull fracture in addition to persistent headache. No focal deficits noted.  I will discontinue Toradol and add Dilaudid 2 mg every 4 hours when necessary for severe headache. No other changes made in current management. We'll plan to discharge when headache is controlled.  C.R. [name], MD [facility]  [phone]  08/28/2012  11:28 AM

## 2012-08-29 DIAGNOSIS — I519 Heart disease, unspecified: Secondary | ICD-10-CM

## 2012-08-29 MED ORDER — METHOCARBAMOL 500 MG PO TABS
500.0000 mg | ORAL_TABLET | Freq: Four times a day (QID) | ORAL | Status: DC
  Administered 2012-08-29 – 2012-08-31 (×7): 500 mg via ORAL
  Filled 2012-08-29 (×11): qty 1

## 2012-08-29 NOTE — Progress Notes (Signed)
NEURO HOSPITALIST PROGRESS NOTE   SUBJECTIVE:                                                                                                                         Patient still complaining of significant HA, located on the left temporal-parietal aspect. Worse when he coughs--more of a throbbing.  OBJECTIVE:                                                                                                                           Vital signs in last 24 hours: Temp:  [97.8 F (36.6 C)-98.7 F (37.1 C)] 98.2 F (36.8 C) (07/24 0504) Pulse Rate:  [78-93] 78 (07/24 0504) Resp:  [16-18] 16 (07/24 0504) BP: (112-158)/(51-86) 112/51 mmHg (07/24 0504) SpO2:  [94 %-98 %] 94 % (07/24 0504)  Intake/Output from previous day: 07/23 0701 - 07/24 0700 In: 455 [I.V.:400; IV Piggyback:55] Out: 950 [Urine:950] Intake/Output this shift:   Nutritional status: General  Past Medical History  Diagnosis Date  . Head trauma   . Alcohol abuse   . Depression   . Abnormal stress echocardiogram   . Hypercholesterolemia   . Hypertension     Neurologic Exam:    Lab Results: Lab Results  Component Value Date/Time   CHOL 200 04/19/2010 12:14 AM   Lipid Panel No results found for this basename: CHOL, TRIG, HDL, CHOLHDL, VLDL, LDLCALC,  in the last 72 hours  Studies/Results: No results found.  MEDICATIONS                                                                                                                        Scheduled: . amitriptyline  10 mg Oral QHS  . pantoprazole  40 mg Oral Q1200  . senna-docusate  1 tablet Oral BID  . valproate sodium  500 mg Intravenous Q12H   ZOX:[id number], acetaminophen, HYDROcodone-acetaminophen, HYDROmorphone (DILAUDID) injection, labetalol  ASSESSMENT/PLAN:                                                                                                            57 year old man with history of traumatic  brain injury presenting with fall and acute left frontal and parietal subarachnoid hemorrhage and subdural hemorrhage as well in the parietal region. Patient has skull fracture in addition to persistent headache. No focal deficits noted.   He is currently on Depacon 500 mg BID, Norco 5-325 Q4 hours prn, Dilaudid 2 mg Q4 hours PRN, and amitriptyline. Patients HA remains a 7-10/10 but has only received 2 doses Dilaudid yesterday and none today. Will add Robaxin to his HA regime at 500 mg Q6 hours. Possible discharge tomorrow if HA is better.    Assessment and plan discussed with with attending physician and they are in agreement.    Felicie Morn PA-C [facility] [phone]  08/29/2012, 10:08 AM

## 2012-08-29 NOTE — Progress Notes (Signed)
  Echocardiogram 2D Echocardiogram has been performed.  Cathie Beams 08/29/2012, 3:35 PM

## 2012-08-30 NOTE — Progress Notes (Signed)
Subjective: Patient continues to experience severe headaches involving his right parietal region primarily. He is getting only slight relief with parenteral analgesic medication and muscle relaxant.  Objective: Current vital signs: BP 137/76  Pulse 87  Temp(Src) 98.7 F (37.1 C) (Oral)  Resp 18  Ht 6\' 2"  (1.88 m)  Wt 107.7 kg (237 lb 7 oz)  BMI 30.47 kg/m2  SpO2 93%  Neurologic Exam: Patient was sleeping at the time I entered the room. He was easy to arouse. He did not appear to be in any acute distress, although he indicated his headache was 10/10 in intensity. There is well-oriented to time as well as place. Mental status was unremarkable. Speech was normal. Patient moved extremities equally with no signs of focal weakness. Coordination was normal.  Medications: I have reviewed the patient's current medications.  Assessment/Plan: Continued intractable headache associated with closed head trauma with left parietal subarachnoid and subdural hemorrhage. Patient is clinically stable with no focal deficits and no changes in alertness.  I will discontinue Depacon at this point this patient has had no improvement. No other changes will be made in his current management. I anticipate discharging him in the a.m.   C.R. [name], MD [facility] [phone]  08/30/2012  12:04 PM

## 2012-08-31 DIAGNOSIS — I609 Nontraumatic subarachnoid hemorrhage, unspecified: Secondary | ICD-10-CM

## 2012-08-31 DIAGNOSIS — S0990XA Unspecified injury of head, initial encounter: Secondary | ICD-10-CM

## 2012-08-31 DIAGNOSIS — S065XAA Traumatic subdural hemorrhage with loss of consciousness status unknown, initial encounter: Secondary | ICD-10-CM

## 2012-08-31 DIAGNOSIS — S065X9A Traumatic subdural hemorrhage with loss of consciousness of unspecified duration, initial encounter: Secondary | ICD-10-CM

## 2012-08-31 DIAGNOSIS — I6200 Nontraumatic subdural hemorrhage, unspecified: Secondary | ICD-10-CM

## 2012-08-31 DIAGNOSIS — F1010 Alcohol abuse, uncomplicated: Secondary | ICD-10-CM

## 2012-08-31 HISTORY — DX: Traumatic subdural hemorrhage with loss of consciousness status unknown, initial encounter: S06.5XAA

## 2012-08-31 HISTORY — DX: Traumatic subdural hemorrhage with loss of consciousness of unspecified duration, initial encounter: S06.5X9A

## 2012-08-31 HISTORY — DX: Nontraumatic subarachnoid hemorrhage, unspecified: I60.9

## 2012-08-31 MED ORDER — METHOCARBAMOL 500 MG PO TABS: 500.0000 mg | ORAL_TABLET | Freq: Four times a day (QID) | ORAL | Status: DC

## 2012-08-31 MED ORDER — AMITRIPTYLINE HCL 10 MG PO TABS: 10.0000 mg | ORAL_TABLET | Freq: Every day | ORAL | Status: DC

## 2012-08-31 MED ORDER — HYDROMORPHONE HCL 2 MG PO TABS: 2.0000 mg | ORAL_TABLET | Freq: Four times a day (QID) | ORAL | Status: DC | PRN

## 2012-08-31 NOTE — Discharge Summary (Addendum)
Discharge Summary  Patient ID: Erik [name]   MRN: [id number]      DOB: 09-27-1955  Date of Admission: 08/26/2012 Date of Discharge: 08/31/2012  Attending Physician:  Erik Kennedy MD  Consulting Physician(s):   Treatment Team:  Erik Groom, MD Neurosurgery  Patient's PCP:  No PCP Per Patient  Discharge Diagnoses:  Active Problems:   HEAD TRAUMA   SAH (subarachnoid hemorrhage)   SDH (subdural hematoma)  BMI: Body mass index is 30.47 kg/(m^2).  Past Medical History  Diagnosis Date  . Head trauma   . Alcohol abuse   . Depression   . Abnormal stress echocardiogram   . Hypercholesterolemia   . Hypertension    Past Surgical History  Procedure Laterality Date  . Kidney surgery      >30 years ago  . Hernia repair    . Cardiac cath    . Brain surgery    . Head injury surgery        Medication List    STOP taking these medications       acetaminophen 325 MG tablet  Commonly known as:  TYLENOL     aspirin 81 MG tablet      TAKE these medications       amitriptyline 10 MG tablet  Commonly known as:  ELAVIL  Take 1 tablet (10 mg total) by mouth at bedtime.     betamethasone valerate 0.1 % cream  Commonly known as:  VALISONE  Apply 1 application topically daily as needed.     citalopram 20 MG tablet  Commonly known as:  CELEXA  Take 20 mg by mouth daily.     esomeprazole 40 MG capsule  Commonly known as:  NEXIUM  Take 40 mg by mouth daily before breakfast.     HYDROmorphone 2 MG tablet  Commonly known as:  DILAUDID  Take 1 tablet (2 mg total) by mouth every 6 (six) hours as needed for pain.     methocarbamol 500 MG tablet  Commonly known as:  ROBAXIN  Take 1 tablet (500 mg total) by mouth 4 (four) times daily as needed for spasm     metoprolol succinate 25 MG 24 hr tablet  Commonly known as:  TOPROL-XL  Take 1 tablet (25 mg total) by mouth daily.     rosuvastatin 20 MG tablet  Commonly known as:  CRESTOR  Take 1 tablet (20 mg total) by mouth  daily.        LABORATORY STUDIES CBC    Component Value Date/Time   WBC 6.7 08/26/2012 0900   RBC 4.59 08/26/2012 0900   HGB 14.3 08/26/2012 0900   HCT 41.8 08/26/2012 0900   PLT 173 08/26/2012 0900   MCV 91.1 08/26/2012 0900   MCH 31.2 08/26/2012 0900   MCHC 34.2 08/26/2012 0900   RDW 13.5 08/26/2012 0900   LYMPHSABS 1.1 08/26/2012 0900   MONOABS 0.6 08/26/2012 0900   EOSABS 0.1 08/26/2012 0900   BASOSABS 0.0 08/26/2012 0900   CMP    Component Value Date/Time   NA 133* 08/26/2012 0900   K 3.7 08/26/2012 0900   CL 96 08/26/2012 0900   CO2 30 08/26/2012 0900   GLUCOSE 117* 08/26/2012 0900   BUN 10 08/26/2012 0900   CREATININE 0.92 08/26/2012 0900   CALCIUM 9.0 08/26/2012 0900   PROT 6.9 04/19/2010 0014   ALBUMIN 4.4 04/19/2010 0014   AST 11 04/19/2010 0014   ALT 10 04/19/2010 0014   ALKPHOS 55  04/19/2010 0014   BILITOT 0.4 04/19/2010 0014   GFRNONAA >90 08/26/2012 0900   GFRAA >90 08/26/2012 0900   COAGS Lab Results  Component Value Date   INR 1.02 08/26/2012   INR 0.97 03/02/2009   INR 0.97 03/02/2009   Lipid Panel    Component Value Date/Time   CHOL 200 04/19/2010 0014   TRIG 116 04/19/2010 0014   HDL 45 04/19/2010 0014   CHOLHDL 4.4 Ratio 04/19/2010 0014   VLDL 23 04/19/2010 0014   LDLCALC 132* 04/19/2010 0014   HgbA1C  No results found for this basename: HGBA1C   Cardiac Panel (last 3 results) No results found for this basename: CKTOTAL, CKMB, TROPONINI, RELINDX,  in the last 72 hours Urinalysis    Component Value Date/Time   COLORURINE YELLOW 09/28/2009 1118   APPEARANCEUR CLEAR 09/28/2009 1118   LABSPEC 1.020 09/28/2009 1118   PHURINE 6.5 09/28/2009 1118   GLUCOSEU NEGATIVE 09/28/2009 1118   HGBUR TRACE* 09/28/2009 1118   BILIRUBINUR NEGATIVE 09/28/2009 1118   KETONESUR 15* 09/28/2009 1118   PROTEINUR NEGATIVE 09/28/2009 1118   UROBILINOGEN 0.2 09/28/2009 1118   NITRITE NEGATIVE 09/28/2009 1118   LEUKOCYTESUR NEGATIVE 09/28/2009 1118   Urine Drug Screen     Component Value  Date/Time   LABOPIA NONE DETECTED 02/04/2007 1947   COCAINSCRNUR NONE DETECTED 02/04/2007 1947   LABBENZ NONE DETECTED 02/04/2007 1947   AMPHETMU NONE DETECTED 02/04/2007 1947   THCU NONE DETECTED 02/04/2007 1947   LABBARB  Value: NONE DETECTED        DRUG SCREEN FOR MEDICAL PURPOSES ONLY.  IF CONFIRMATION IS NEEDED FOR ANY PURPOSE, NOTIFY LAB WITHIN 5 DAYS. 02/04/2007 1947    Alcohol Level    Component Value Date/Time   ETH  Value: 217        LOWEST DETECTABLE LIMIT FOR SERUM ALCOHOL IS 11 mg/dL FOR MEDICAL PURPOSES ONLY* 02/04/2007 1947     SIGNIFICANT DIAGNOSTIC STUDIES     History of Present Illness  Erik [name] is an 57 y.o. male with a past medical history significant for HTN, hyperlipidemia, PTSD, depression, alcohol abuse, severe TBI with removal of right brain acute SDH at [facility] 10 years ago, " balck out spells", transferred to [facility] for further management of a mild post traumatic left parietal SAH/SDH. Case was already discussed with neurosurgery by the Erik [name] ED physician and no need to further neurosurgical intervention at this time was recommended.  Erik [name] reported that 2 days prior to admission he was at home, stood up quickly, became very lightheaded and sweaty, collapsed to the floor, hit his head very head, and had transient loss of consciousness. He had been experiencing a severe, sharp headache and dizziness ever since and thus presented to [facility] ED on 08/26/2012 and had a CT brain that showed a small amount of subarachnoid blood within sulci at the high left parietal lobe and a small adjacent subdural hematoma 3 mm thick without mass effect. No intraparenchymal or intraventricular hemorrhage.  He indicated that except for short term memory los and imbalance he had no other sequelae from prior brain surgery.  Reports no history of GTC seizures but recent spells of blacking out preceded by lightheadedness and sweatiness after standing up.  No new  medications but said that had had issues with low blood pressure in the past.   Hospital Course  The patient was admitted to [facility] on 08/26/2012 for further evaluation and treatment. A neurosurgery consult was  obtained from Dr.Nudelman on the 21st recommended a CT scan of the brain without contrast on 08/28/2012 for followup of a traumatic subarachnoid hemorrhage and subdural hematoma. A followup scan was performed on the 22nd. This showed atrophy with old infarcts and a stable appearance of the subarachnoid hemorrhage at the left parietal region with questionably small mild subarachnoid blood at the subfrontal region.  There was a question of a small subdural hematoma. Dr. Newell Coral did not feel that there was any indication for surgery.  An EEG was performed on 08/27/2012. This was a normal awake and asleep EEG.  A serum alcohol level on admission was elevated at 217. The patient was strongly advised to abstain from future alcohol use.  The patient did have headaches which were quite severe. He was treated with Depacon without success. He was also given hydrocodone and Toradol which did not help. Eventually he was treated with Robaxin, dilaudid, and amitriptyline.  A 2-D echo was performed on 08/29/2012 revealed an ejection fraction of 55-60%. No significant abnormalities were noted.  Was eventually felt that the patient to be discharged once his headache pain was under control. Dr. [name] recommended discharge on Saturday, 08/31/2012 with prescriptions for Robaxin, amitriptyline, and dilaudid for pain control. The patient was instructed not to drive or operate heavy machinery while taking these medications. He was recommended that he follow up with neurology in 1-2 weeks. Apparently he has no primary care physician.    Discharge Exam  Blood pressure 127/73, pulse 87, temperature 98 F (36.7 C), temperature source Oral, resp. rate 18, height 6\' 2"  (1.88 m), weight 107.7 kg (237 lb  7 oz), SpO2 93.00%.   Neurologic Exam:  Patient is alert and did not appear to be in distress though he was complaining of severe headache.  He was well-oriented to time as well as place. Mental status was unremarkable.  Speech was normal.  Patient moved extremities equally with no signs of focal weakness.  Coordination was normal.  Discharge Diet   regular diet  Discharge Plan    Disposition:  The patient was discharged to home in stable and improved condition.  The patient's aspirin 81 mg daily was held at time of discharge secondary to recent hemorrhage.  Follow-up with neurology in 1-2 weeks.  The patient needs to abstain from alcohol.  Greater than 30  minutes were spent preparing discharge.  Delton See PA-C [facility] Pager [phone] 08/31/2012, 3:01 PM  I have personally examined this patient, reviewed pertinent data and developed the plan of care. I agree with above.   Venetia Maxon M.D. [facility] [phone]

## 2012-08-31 NOTE — Progress Notes (Signed)
Pt stable, discharge order received. Reviewed meds with patient. Advised him to stop drinking and to not drink when taking pain meds. Pt in agreement he needs to stop drinking. Pt to be discharged home with friend.

## 2012-09-23 ENCOUNTER — Encounter: Payer: Self-pay | Admitting: Neurology

## 2012-09-23 ENCOUNTER — Ambulatory Visit (INDEPENDENT_AMBULATORY_CARE_PROVIDER_SITE_OTHER): Payer: Self-pay | Admitting: Neurology

## 2012-09-23 VITALS — BP 130/70 | HR 84 | Temp 98.1°F | Ht 74.0 in | Wt 243.0 lb

## 2012-09-23 DIAGNOSIS — R4020 Unspecified coma: Secondary | ICD-10-CM

## 2012-09-23 DIAGNOSIS — F0781 Postconcussional syndrome: Secondary | ICD-10-CM

## 2012-09-23 DIAGNOSIS — R404 Transient alteration of awareness: Secondary | ICD-10-CM

## 2012-09-23 DIAGNOSIS — S0990XA Unspecified injury of head, initial encounter: Secondary | ICD-10-CM

## 2012-09-23 NOTE — Patient Instructions (Addendum)
I don't think you had seizures.  I think the blacking out is related to drop in blood pressure. 1.  Keep hydrated 2.  Use compression stockings (knee-high) 3.  Take time getting up from bed or chair.  The headaches seem to be related to the head injury 1.  Rest. 2.  Continue amitriptyline. 3.  Do not take hydrocodone or any pain medication daily as it may precipitate rebound headache. 4.  Repeat CT of brain.  If stable, may use ibuprofen or acetaminophen for pain.  Follow up in 2 months.   Your CT scan is scheduled at [facility] located at [facility] on Thursday, August 21st at 10:00 am. Please arrive 15 minutes before your scheduled appointment.   [phone]
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
## 2012-09-26 ENCOUNTER — Ambulatory Visit (INDEPENDENT_AMBULATORY_CARE_PROVIDER_SITE_OTHER)
Admission: RE | Admit: 2012-09-26 | Discharge: 2012-09-26 | Disposition: A | Discharge status: Home / Self Care | Payer: Self-pay | Source: Ambulatory Visit | Attending: Neurology | Admitting: Neurology

## 2012-09-26 DIAGNOSIS — R4020 Unspecified coma: Secondary | ICD-10-CM

## 2012-09-26 DIAGNOSIS — S0990XA Unspecified injury of head, initial encounter: Secondary | ICD-10-CM

## 2012-09-26 DIAGNOSIS — R404 Transient alteration of awareness: Secondary | ICD-10-CM

## 2012-09-30 ENCOUNTER — Telehealth: Payer: Self-pay | Admitting: Neurology

## 2012-09-30 NOTE — Telephone Encounter (Signed)
Spoke with the patient. Information given as per Dr. Everlena Cooper below. Pt had no additional questions or concerns.

## 2012-09-30 NOTE — Telephone Encounter (Signed)
Message copied by Benay Spice on Mon Sep 30, 2012 10:51 AM ------      Message from: JAFFE, ADAM R      Created: Fri Sep 27, 2012  3:26 PM       Please let Mr. Littles know that there is no longer any blood in his brain.  Everything looks stable.  From my standpoint, it would be okay to use ibuprofen for headache, if necessary.      AJ      ----- Message -----         From: Rad Results In Interface         Sent: 09/26/2012  10:24 AM           To: Cira Servant, DO                   ------

## 2012-10-10 DIAGNOSIS — F329 Major depressive disorder, single episode, unspecified: Secondary | ICD-10-CM | POA: Insufficient documentation

## 2012-10-10 DIAGNOSIS — F410 Panic disorder [episodic paroxysmal anxiety] without agoraphobia: Secondary | ICD-10-CM | POA: Insufficient documentation

## 2012-11-25 ENCOUNTER — Encounter: Payer: Self-pay | Admitting: Cardiology

## 2012-11-28 ENCOUNTER — Ambulatory Visit (INDEPENDENT_AMBULATORY_CARE_PROVIDER_SITE_OTHER): Payer: Self-pay | Admitting: Neurology

## 2012-11-28 ENCOUNTER — Encounter: Payer: Self-pay | Admitting: Neurology

## 2012-11-28 VITALS — BP 118/68 | HR 70 | Temp 98.1°F | Ht 74.0 in | Wt 233.0 lb

## 2012-11-28 DIAGNOSIS — I951 Orthostatic hypotension: Secondary | ICD-10-CM

## 2012-11-28 DIAGNOSIS — G44309 Post-traumatic headache, unspecified, not intractable: Secondary | ICD-10-CM

## 2012-11-28 MED ORDER — AMITRIPTYLINE HCL 25 MG PO TABS: 25.0000 mg | ORAL_TABLET | Freq: Every day | ORAL | Status: DC
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
## 2012-11-28 NOTE — Patient Instructions (Signed)
Take the amitriptyline 25mg  at bedtime.  Limit Tylenol and Advil to no more than 2 days out of the week.  Follow up in 3 months.

## 2013-01-07 ENCOUNTER — Encounter: Payer: Self-pay | Admitting: Gastroenterology

## 2013-02-28 ENCOUNTER — Encounter: Payer: Self-pay | Admitting: Neurology

## 2013-02-28 ENCOUNTER — Ambulatory Visit (INDEPENDENT_AMBULATORY_CARE_PROVIDER_SITE_OTHER): Payer: Self-pay | Admitting: Neurology

## 2013-02-28 VITALS — BP 90/60 | HR 60 | Temp 98.4°F | Resp 18 | Ht 74.0 in | Wt 230.8 lb

## 2013-02-28 DIAGNOSIS — G901 Familial dysautonomia [Riley-Day]: Secondary | ICD-10-CM

## 2013-02-28 DIAGNOSIS — G909 Disorder of the autonomic nervous system, unspecified: Secondary | ICD-10-CM

## 2013-02-28 DIAGNOSIS — G609 Hereditary and idiopathic neuropathy, unspecified: Secondary | ICD-10-CM

## 2013-02-28 DIAGNOSIS — G44229 Chronic tension-type headache, not intractable: Secondary | ICD-10-CM

## 2013-02-28 DIAGNOSIS — I951 Orthostatic hypotension: Secondary | ICD-10-CM

## 2013-02-28 NOTE — Progress Notes (Signed)
NEUROLOGY FOLLOW UP OFFICE NOTE  Erik [name] [id number]  HISTORY OF PRESENT ILLNESS: Erik [name] is a 58 year old man with history of HTN, hyperlipidemia, PTSD, alcohol abuse, depression and  who presents for follow-up regarding post-concussion headache secondary to post-traumatic intracranial hemorrhage and gait unsteadiness.  Records and images were personally reviewed where available.    UPDATE: Since starting the amitriptyline 25mg , he has a headache about every other day.  He takes Tylenol whenever he gets a headache.  The headache now is an aching in the back and front of his head.  No associated nausea.  Otherwise, he still feels very unsteady and dizzy when he stands up or is walking.  It has been a challenge controlling his blood pressure as it has fluctuated.  He also notes fatigue.  Patient's cardiologist, Dr. Jenell Milliner, has retired and has plans to see a new physician at the [facility] in [place].  He doesn't yet know who that physician will be yet.  Over the summer, was on the porch with a friend.  He stood up and felt lightheaded, and then passed out.  When he woke up, the paramedics were there.  He did not have any convulsions, tongue laceration or incontinence.  He elected not to go to the hospital.  Later that night, he began to feel shaky with cold sweat.  He presented a couple of days later to an outside ED, where CT of brain revealed small amount of subarachnoid blood at the high left parietal lobe, as well as small 3 mm adjacent subdural hematoma without mass effect.  There was also a non-displaced left parietal skull fracture.  He was subsequently transferred to [facility].  Serum etoh level was 217.  He was evaluated by neurosurgery.  Follow up CT scan was stable, so no surgical intervention was indicated.  EEG from 08/27/12 was normal.  2D Echo revealed EF 55-60% with no significant abnormalities.  Black outs were thought to be secondary to  postural hypotension, rather than seizure.  For the headaches, he was given Depacon, hydrocodone and Toradol, which were all ineffective.  He was then treated with Robaxin, dilaudid and amitriptyline 10mg  daily, and was discharged on these medications.  He was told not to take NSAIDs or acetaminophen given that he had an acute bleed.     After discharge, his severe left sided stabbing headache has decreased to a dull holocephalic headache.  It is constant.  He takes at least one hydrocodone in the morning.  He has not had any further black outs but on one occasion was presyncopal.  He has seen cardiology for this, as he does have extreme fluctuation in blood pressure.    Repeat Head CT performed 09/26/12 revealed resolution of bleed.    Recommended amitriptyline 25mg , stop hydrocodone and limit use of NSAIDs and other pain relievers.  PAST MEDICAL HISTORY: Past Medical History  Diagnosis Date  . Head trauma 2001    closed head injury; coma for 4 weeks  . Alcohol abuse   . Depression   . Abnormal stress echocardiogram   . Hypercholesterolemia   . Hypertension     MEDICATIONS: Current Outpatient Prescriptions on File Prior to Visit  Medication Sig Dispense Refill  . amitriptyline (ELAVIL) 25 MG tablet Take 1 tablet (25 mg total) by mouth at bedtime.  30 tablet  3  . aspirin 81 MG tablet Take 81 mg by mouth daily.      Marland Kitchen  betamethasone valerate (VALISONE) 0.1 % cream Apply 1 application topically daily as needed.      . citalopram (CELEXA) 20 MG tablet Take 20 mg by mouth daily.      Marland Kitchen esomeprazole (NEXIUM) 40 MG capsule Take 40 mg by mouth daily before breakfast.      . furosemide (LASIX) 20 MG tablet Take 20 mg by mouth as needed.      Marland Kitchen HYDROmorphone (DILAUDID) 2 MG tablet Take 1 tablet (2 mg total) by mouth every 6 (six) hours as needed for pain.  40 tablet  0  . methocarbamol (ROBAXIN) 500 MG tablet Take 1 tablet (500 mg total) by mouth 4 (four) times daily.  40 tablet  0  . metoprolol  succinate (TOPROL-XL) 25 MG 24 hr tablet Take 1 tablet (25 mg total) by mouth daily.  90 tablet  3  . potassium chloride (KLOR-CON) 20 MEQ packet Take 20 mEq by mouth daily.      . rosuvastatin (CRESTOR) 20 MG tablet Take 1 tablet (20 mg total) by mouth daily.  90 tablet  3  . [DISCONTINUED] Dexlansoprazole (DEXILANT) 30 MG capsule Take 30 mg by mouth daily.         No current facility-administered medications on file prior to visit.    ALLERGIES: No Known Allergies  FAMILY HISTORY: Family History  Problem Relation Age of Onset  . Coronary artery disease Other     SOCIAL HISTORY: History   Social History  . Marital Status: Divorced    Spouse Name: N/A    Number of Children: N/A  . Years of Education: N/A   Occupational History  . Full time    Social History Main Topics  . Smoking status: Current Every Day Smoker    Types: Cigarettes  . Smokeless tobacco: Never Used     Comment: QUIT 6-7 WEEKS AGO  . Alcohol Use: Yes     Comment: beer occ  . Drug Use: Yes    Special: Marijuana     Comment: marijuana last 2012  . Sexual Activity: Not on file   Other Topics Concern  . Not on file   Social History Narrative   Single    REVIEW OF SYSTEMS: Constitutional: fatigue Eyes: No visual changes, double vision, eye pain Ear, nose and throat: dizzy Cardiovascular: No chest pain, palpitations Respiratory:  No shortness of breath at rest or with exertion, wheezes GastrointestinaI: No nausea, vomiting, diarrhea, abdominal pain, fecal incontinence Genitourinary:  No dysuria, urinary retention or frequency Musculoskeletal:  No neck pain, back pain Integumentary: No rash, pruritus, skin lesions Neurological: as above Psychiatric: No depression, insomnia, anxiety Endocrine: No palpitations, fatigue, diaphoresis, mood swings, change in appetite, change in weight, increased thirst Hematologic/Lymphatic:  No anemia, purpura, petechiae. Allergic/Immunologic: no itchy/runny eyes,  nasal congestion, recent allergic reactions, rashes  PHYSICAL EXAM: Vitals: BP 90/60, P 60, R 18, Temp 98.4, Wt 230.8 lbs, Ht 6'2"  Positive orthostatics: Rt arm:   Lft arm: 130/78 supine  120/78 supine 104/70 sitting  128/72 sitting 90/60 standing  92/68 standing  General: No acute distress Head:  Normocephalic/atraumatic Neck: supple, no paraspinal tenderness, full range of motion Heart:  Regular rate and rhythm Lungs:  Clear to auscultation bilaterally Back: No paraspinal tenderness Neurological Exam: alert and oriented to person, place, and time. Speech fluent and not dysarthric, language intact.  CN II-XII intact.  Bulk and tone normal, muscle strength 5/5 throughout.  Sensation to pinprick mildly reduced in feet.  Vibration reduced in feet.  Deep tendon reflexes 2+ throughout, except absent in ankles, toes down.  Finger to nose intact.  Gait with normal stride but sways a bit, Romberg with sway.  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  For headache, continue amitriptyline 25mg  daily for now but limit use of Tylenol and other pain relievers to no more than 2 days out of the week. 2.  For dysautonomia, it is supportive management to be discussed with cardiologist.  Take time getting up, consider compression stockings 3.  Will check thiamine, B6 and B12 levels 4.  Follow up in 6 months or as needed.  30 minutes spent with patient, over 50% spent counseling and coordinating care.  Metta Clines, DO  CC:  Will fax note to the [facility] for patient's new physician to review.

## 2013-02-28 NOTE — Patient Instructions (Addendum)
1.  For headache, continue the amitriptyline 25mg  daily.  But limit use of all pain relievers (including tylenol and Advil) to no more than 2 days out of the week to prevent rebound headache. 2.  The problems with dizziness and unsteadiness may be due to history of alcohol use, which affects the nerves.  This will have to be treated with supportive care, such as compression stockings for the legs.  We will check for vitamin deficiencies that may affect this as well (vitamin B1, B6 and B12). 3.  Follow up in 6 months but call with questions or concerns.

## 2013-03-01 LAB — VITAMIN B12: VITAMIN B 12: 241 pg/mL (ref 211–911)

## 2013-03-04 LAB — METHYLMALONIC ACID, SERUM: Methylmalonic Acid, Quant: 0.4 umol/L — ABNORMAL HIGH (ref <0.40)

## 2013-03-05 LAB — VITAMIN B6: Vitamin B6: 9.5 ng/mL (ref 2.1–21.7)

## 2013-03-05 LAB — VITAMIN B1: Vitamin B1 (Thiamine): 7 nmol/L — ABNORMAL LOW (ref 8–30)

## 2013-03-06 ENCOUNTER — Telehealth: Payer: Self-pay | Admitting: *Deleted

## 2013-03-06 NOTE — Telephone Encounter (Signed)
I spoke with patient he is unable to come in for B12 injection because of transportation he will start 1090mcg daily as well as Thiamine 50 mg  Daily

## 2013-03-13 ENCOUNTER — Telehealth: Payer: Self-pay | Admitting: Neurology

## 2013-03-13 NOTE — Telephone Encounter (Signed)
Pt called, he would like [place] to call the [facility] Regarding his B12 and Thiamine injections. He says they will do the injection there, we just need to provide the inof. Pt has an appt on Monday. Please call [phone], [phone] / Sherri S.

## 2013-05-06 ENCOUNTER — Encounter: Payer: Self-pay | Admitting: Cardiology

## 2013-05-07 ENCOUNTER — Encounter: Payer: Self-pay | Admitting: Cardiology

## 2013-05-07 ENCOUNTER — Ambulatory Visit (INDEPENDENT_AMBULATORY_CARE_PROVIDER_SITE_OTHER): Payer: Self-pay | Admitting: Cardiology

## 2013-05-07 VITALS — BP 100/61 | HR 84 | Ht 74.0 in | Wt 233.0 lb

## 2013-05-07 DIAGNOSIS — I2510 Atherosclerotic heart disease of native coronary artery without angina pectoris: Secondary | ICD-10-CM

## 2013-05-07 DIAGNOSIS — I951 Orthostatic hypotension: Secondary | ICD-10-CM

## 2013-05-07 NOTE — Patient Instructions (Signed)
Your physician recommends that you schedule a follow-up appointment in:  3 weeks  Your physician has recommended you make the following change in your medication:  STOP metoprolol  Please bring BP log at next visit.

## 2013-05-07 NOTE — Progress Notes (Signed)
Clinical Summary Erik [name] is a 58 y.o.male former patient of Dr Verl Blalock, this is our first visit together. This is a focused visit on a new problem of syncope he has been having.    1. Syncope - since  08/2012 reports some occasonal episodes of syncope. First episode 08/2012 occurred while sitting on porch, stood up to go to bathroom. [address] and blacked out. No significant prodrome. No chest pain or palpitaitons. Seen at [facility] after that event, found to have subdural and subarachnoid bleed, managed conservatively. Found to have very high EtOH level at 217.   -has has 2 other similar episodes. The 2nd episode once again occurred while sitting to standing. The 3rd episode occurred this past Sunday while he was walking up his driveway. - echo 08/2012 LVEF 55-60%, no significant pathology - EKG NSR, RBBB - has occasional palpitation, but denies any significant symptoms - he has been seen by neurology, found to be severely orthostatic in Dr Georgie Chard clinic. SBP dropped 40 points with standing - he admits to heavy ice tea drinking daily, not much other fluid intake. He is also on Toprol XL.    Past Medical History  Diagnosis Date  . Head trauma 2001    closed head injury; coma for 4 weeks  . Alcohol abuse   . Depression   . Abnormal stress echocardiogram   . Hypercholesterolemia   . Hypertension      No Known Allergies   Current Outpatient Prescriptions  Medication Sig Dispense Refill  . amitriptyline (ELAVIL) 25 MG tablet Take 1 tablet (25 mg total) by mouth at bedtime.  30 tablet  3  . aspirin 81 MG tablet Take 81 mg by mouth daily.      . betamethasone valerate (VALISONE) 0.1 % cream Apply 1 application topically daily as needed.      . citalopram (CELEXA) 20 MG tablet Take 20 mg by mouth daily.      Marland Kitchen esomeprazole (NEXIUM) 40 MG capsule Take 40 mg by mouth daily before breakfast.      . FLUoxetine (PROZAC) 20 MG capsule Take 20 mg by mouth daily.      . furosemide (LASIX) 20 MG  tablet Take 20 mg by mouth as needed.      Marland Kitchen HYDROmorphone (DILAUDID) 2 MG tablet Take 1 tablet (2 mg total) by mouth every 6 (six) hours as needed for pain.  40 tablet  0  . methocarbamol (ROBAXIN) 500 MG tablet Take 1 tablet (500 mg total) by mouth 4 (four) times daily.  40 tablet  0  . metoprolol succinate (TOPROL-XL) 25 MG 24 hr tablet Take 1 tablet (25 mg total) by mouth daily.  90 tablet  3  . potassium chloride (KLOR-CON) 20 MEQ packet Take 20 mEq by mouth daily.      . rosuvastatin (CRESTOR) 20 MG tablet Take 1 tablet (20 mg total) by mouth daily.  90 tablet  3  . [DISCONTINUED] Dexlansoprazole (DEXILANT) 30 MG capsule Take 30 mg by mouth daily.         No current facility-administered medications for this visit.     Past Surgical History  Procedure Laterality Date  . Kidney surgery      >30 years ago  . Hernia repair  2012  . Cardiac cath    . Brain surgery  2001  . Head injury surgery       No Known Allergies    Family History  Problem Relation Age of Onset  .  Coronary artery disease Other   . Ataxia Neg Hx   . [place] Neg Hx   . Dementia Neg Hx   . Mental retardation Neg Hx   . Migraines Neg Hx   . Multiple sclerosis Neg Hx   . Neurofibromatosis Neg Hx   . Neuropathy Neg Hx   . Parkinsonism Neg Hx   . Seizures Neg Hx   . Stroke Neg Hx      Social History Erik [name] reports that he has been smoking Cigarettes.  He has been smoking about 0.00 packs per day. He has never used smokeless tobacco. Erik [name] reports that he drinks alcohol.   Review of Systems CONSTITUTIONAL: No weight loss, fever, chills, weakness or fatigue.  HEENT: Eyes: No visual loss, blurred vision, double vision or yellow sclerae.No hearing loss, sneezing, congestion, runny nose or sore throat.  SKIN: No rash or itching.  CARDIOVASCULAR: no chest pain RESPIRATORY: No shortness of breath, cough or sputum.  GASTROINTESTINAL: No anorexia, nausea, vomiting or diarrhea. No abdominal pain or  blood.  GENITOURINARY: No burning on urination, no polyuria NEUROLOGICAL: No headache, dizziness, syncope, paralysis, ataxia, numbness or tingling in the extremities. No change in bowel or bladder control.  MUSCULOSKELETAL: No muscle, back pain, joint pain or stiffness.  LYMPHATICS: No enlarged nodes. No history of splenectomy.  PSYCHIATRIC: No history of depression or anxiety.  ENDOCRINOLOGIC: No reports of sweating, cold or heat intolerance. No polyuria or polydipsia.  Marland Kitchen   Physical Examination There were no vitals filed for this visit. There were no vitals filed for this visit.  Gen: resting comfortably, no acute distress HEENT: no scleral icterus, pupils equal round and reactive, no palptable cervical adenopathy,  CV Resp: Clear to auscultation bilaterally GI: abdomen is soft, non-tender, non-distended, normal bowel sounds, no hepatosplenomegaly MSK: extremities are warm, no edema.  Skin: warm, no rash Neuro:  no focal deficits Psych: appropriate affect   Diagnostic Studies  03/2009 Cath Central aortic pressure 142/86 with mean of 110.  LV pressure 135/1 with an EDP of 13.  There was no aortic stenosis.  Left main was normal.  LAD gave off moderate-to-large diagonal.  It had a 20% stenosis in the midsection.  Left circumflex gave off small ramus, small OM-1, and moderate-to-large OM-2.  It was angiographically normal.  Right coronary artery was a dominant vessel that gave off a PDA and posterolateral.  There was diffuse approximately 20% luminal plaquing in the proximal and midsection.  Left ventriculogram done in the RAO position showed an EF of 555 to 60% with no regional wall motion abnormalities.  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  There was no significant stenosis.  There was no evidence of abdominal aortic aneurysm.  ASSESSMENT: 1. Minimal nonobstructive coronary artery disease. 2. Normal  left ventricular function. 3. Normal renal arteries.  No evidence of abdominal aortic aneurysm.  PLAN/DISCUSSION:  Given his angiography, it appears that his chest pain was noncardiac and his stress test was a false positive.  We will continue with medical therapy and aggressive risk factor modification.  08/2012 Echo LVEF 55-60%, no WMAs, normal RV, normal PASP  Jan 2015 Positive orthostatics:  Rt arm: Lft arm:  130/78 supine 120/78 supine  104/70 sitting 128/72 sitting  90/60 standing 92/68 standing   Assessment and Plan  1. Orthostatic syncope - history and clinical finding consistent with orthostatic syncope, no clear evidence for cardiac cause at this time.  Prior cardiac studies have shown no structural heart disease - likely related to poor oral intake, drinking mainly caffeinated tea, and also being on metoprolol - stop toprol, discussed regular consumption of sodium/electrolyte fluids - will follow symptoms, next step would be to start midodrine +/- florinef - he will also keep home bp log  F/u 3-4 weeks      Arnoldo Lenis, M.D., F.A.C.C.

## 2013-05-29 ENCOUNTER — Encounter: Payer: Self-pay | Admitting: Adult Health

## 2013-05-29 NOTE — Progress Notes (Signed)
    HPI: [name] is a 58 year old patient of Dr. Harl Bowie where following for ongoing assessment and management after evaluation for syncope by Dr. Harl Bowie on 05/07/2013. On that visit the patient complained of several episodes of syncope, falling and blacking out. With no significant prodrome.  He was diagnosed with orthostatic syncope and not down have a clear cardiac cause. He felt was related likely to poor oral intake drinking daily caffeinated tea and being on metoprolol. Metoprolol was discontinued, and discuss regular consumption of sodium electrolytes and fluids. He continued to have the same episodes there was a consideration for starting Midodrin was asked to keep a blood pressure log.  He continues to have recurrent episodes of near syncope. Denies headache's or blurred vision. He states that "waves' come over him while he is sitting or standing, sometimes while in bed. Denies racing HR or palpitations.     No Known Allergies  Current Outpatient Prescriptions  Medication Sig Dispense Refill  . amitriptyline (ELAVIL) 25 MG tablet Take 1 tablet (25 mg total) by mouth at bedtime.  30 tablet  3  . betamethasone valerate (VALISONE) 0.1 % cream Apply 1 application topically daily as needed.      Marland Kitchen esomeprazole (NEXIUM) 40 MG capsule Take 40 mg by mouth daily before breakfast.      . FLUoxetine (PROZAC) 20 MG capsule Take 20 mg by mouth daily.      . methocarbamol (ROBAXIN) 500 MG tablet Take 500 mg by mouth 4 (four) times daily.      . rosuvastatin (CRESTOR) 20 MG tablet Take 1 tablet (20 mg total) by mouth daily.  90 tablet  3  . [DISCONTINUED] Dexlansoprazole (DEXILANT) 30 MG capsule Take 30 mg by mouth daily.         No current facility-administered medications for this visit.    Past Medical History  Diagnosis Date  . Head trauma 2001    closed head injury; coma for 4 weeks  . Alcohol abuse   . Depression   . Abnormal stress echocardiogram   . Hypercholesterolemia   .  Hypertension     Past Surgical History  Procedure Laterality Date  . Kidney surgery      >30 years ago  . Hernia repair  2012  . Cardiac cath    . Brain surgery  2001  . Head injury surgery      ROS: Review of systems complete and found to be negative unless listed above  PHYSICAL EXAM BP 128/82  Pulse 100  Ht 6\' 2"  (1.88 m)  Wt 227 lb (102.967 kg)  BMI 29.13 kg/m2 General: Well developed, well nourished, in no acute distress Head: Eyes PERRLA, No xanthomas.   Normal cephalic and atramatic  Lungs: Clear bilaterally to auscultation and percussion. Heart: HRRR S1 S2, tachycardic without MRG.  Pulses are 2+ & equal.            No carotid bruit. No JVD.  No abdominal bruits. No femoral bruits. Abdomen: Bowel sounds are positive, abdomen soft and non-tender without masses or                  Hernia's noted. Msk:  Back normal, normal gait. Normal strength and tone for age. Extremities: No clubbing, cyanosis or edema.  DP +1 Neuro: Alert and oriented X 3. Psych:  Good affect, responds appropriately  ASSESSMENT AND PLAN

## 2013-05-30 ENCOUNTER — Encounter: Payer: Self-pay | Admitting: Adult Health

## 2013-05-30 ENCOUNTER — Ambulatory Visit (INDEPENDENT_AMBULATORY_CARE_PROVIDER_SITE_OTHER): Payer: Self-pay | Admitting: Adult Health

## 2013-05-30 VITALS — BP 128/82 | HR 100 | Ht 74.0 in | Wt 227.0 lb

## 2013-05-30 DIAGNOSIS — R55 Syncope and collapse: Secondary | ICD-10-CM

## 2013-05-30 NOTE — Assessment & Plan Note (Signed)
I have checked orthostatic BP in the office manually. He is not orthostatic BP 118/78 with HR of 86 lying, 116/78 HR of 100, Standing BP 118/78 HR 96.   I with place cardiac monitor on him for 30 days to evaluate arrthymgenic etiology of syncope. He will see [place] again in one month after monitor completion.

## 2013-05-30 NOTE — Progress Notes (Deleted)
Name: Erik [name]    DOB: 1956-01-08  Age: 58 y.o.  MR#: [id number]       PCP:  No PCP Per Patient      Insurance: Payor: / No coverage found.  CC:    Chief Complaint  Patient presents with  . Coronary Artery Disease  . Hypertension    VS Filed Vitals:   05/30/13 1421  BP: 128/82  Pulse: 100  Height: 6\' 2"  (1.88 m)  Weight: 227 lb (102.967 kg)    Weights Current Weight  05/30/13 227 lb (102.967 kg)  05/07/13 233 lb (105.688 kg)  02/28/13 230 lb 12.8 oz (104.69 kg)    Blood Pressure  BP Readings from Last 3 Encounters:  05/30/13 128/82  05/07/13 100/61  02/28/13 90/60     Admit date:  (Not on file) Last encounter with RMR:  Visit date not found   Allergy Review of patient's allergies indicates no known allergies.  Current Outpatient Prescriptions  Medication Sig Dispense Refill  . amitriptyline (ELAVIL) 25 MG tablet Take 1 tablet (25 mg total) by mouth at bedtime.  30 tablet  3  . betamethasone valerate (VALISONE) 0.1 % cream Apply 1 application topically daily as needed.      Marland Kitchen esomeprazole (NEXIUM) 40 MG capsule Take 40 mg by mouth daily before breakfast.      . FLUoxetine (PROZAC) 20 MG capsule Take 20 mg by mouth daily.      . methocarbamol (ROBAXIN) 500 MG tablet Take 500 mg by mouth 4 (four) times daily.      . rosuvastatin (CRESTOR) 20 MG tablet Take 1 tablet (20 mg total) by mouth daily.  90 tablet  3  . [DISCONTINUED] Dexlansoprazole (DEXILANT) 30 MG capsule Take 30 mg by mouth daily.         No current facility-administered medications for this visit.    Discontinued Meds:    Medications Discontinued During This Encounter  Medication Reason  . aspirin 81 MG tablet Error  . citalopram (CELEXA) 20 MG tablet Error  . FLUoxetine (PROZAC) 20 MG capsule Error    Patient Active Problem List   Diagnosis Date Noted  . Orthostatic syncope 05/07/2013  . SAH (subarachnoid hemorrhage) 08/31/2012  . SDH (subdural hematoma) 08/31/2012  . Bilateral lower  extremity edema 04/24/2012  . HYPERLIPIDEMIA 04/21/2009  . ING HERN W/GANGREN RECUR UNILAT/UNSPEC ING HERN 04/21/2009  . TOBACCO USER 03/25/2009  . CAD, NATIVE VESSEL 03/25/2009  . HYPERCHOLESTEROLEMIA  IIA 03/02/2009  . ALCOHOL ABUSE 03/02/2009  . DEPRESSION 03/02/2009  . ABNORMAL CV (STRESS) TEST 03/02/2009  . HEAD TRAUMA 03/02/2009  . ABNORMAL STRESS ELECTROCARDIOGRAM 02/23/2009    LABS    Component Value Date/Time   NA 133* 08/26/2012 0900   NA 136 08/08/2012 1012   NA 138 04/19/2012 1012   K 3.7 08/26/2012 0900   K 3.9 08/08/2012 1012   K 4.2 04/19/2012 1012   CL 96 08/26/2012 0900   CL 99 08/08/2012 1012   CL 102 04/19/2012 1012   CO2 30 08/26/2012 0900   CO2 25 08/08/2012 1012   CO2 29 04/19/2012 1012   GLUCOSE 117* 08/26/2012 0900   GLUCOSE 101* 08/08/2012 1012   GLUCOSE 100* 04/19/2012 1012   BUN 10 08/26/2012 0900   BUN 9 08/08/2012 1012   BUN 7 04/19/2012 1012   CREATININE 0.92 08/26/2012 0900   CREATININE 0.86 08/08/2012 1012   CREATININE 0.89 04/19/2012 1012   CALCIUM 9.0 08/26/2012 0900   CALCIUM 9.2 08/08/2012  1012   CALCIUM 9.0 04/19/2012 1012   GFRNONAA >90 08/26/2012 0900   GFRNONAA >90 08/08/2012 1012   GFRNONAA >90 04/19/2012 1012   GFRAA >90 08/26/2012 0900   GFRAA >90 08/08/2012 1012   GFRAA >90 04/19/2012 1012   CMP     Component Value Date/Time   NA 133* 08/26/2012 0900   K 3.7 08/26/2012 0900   CL 96 08/26/2012 0900   CO2 30 08/26/2012 0900   GLUCOSE 117* 08/26/2012 0900   BUN 10 08/26/2012 0900   CREATININE 0.92 08/26/2012 0900   CALCIUM 9.0 08/26/2012 0900   PROT 6.9 04/19/2010 0014   ALBUMIN 4.4 04/19/2010 0014   AST 11 04/19/2010 0014   ALT 10 04/19/2010 0014   ALKPHOS 55 04/19/2010 0014   BILITOT 0.4 04/19/2010 0014   GFRNONAA >90 08/26/2012 0900   GFRAA >90 08/26/2012 0900       Component Value Date/Time   WBC 6.7 08/26/2012 0900   WBC 8.2 08/08/2012 1012   WBC 6.0 04/19/2012 1012   HGB 14.3 08/26/2012 0900   HGB 15.8 08/08/2012 1012   HGB 13.3 04/19/2012 1012   HCT 41.8  08/26/2012 0900   HCT 45.0 08/08/2012 1012   HCT 39.9 04/19/2012 1012   MCV 91.1 08/26/2012 0900   MCV 89.6 08/08/2012 1012   MCV 93.7 04/19/2012 1012    Lipid Panel     Component Value Date/Time   CHOL 200 04/19/2010 0014   TRIG 116 04/19/2010 0014   HDL 45 04/19/2010 0014   CHOLHDL 4.4 Ratio 04/19/2010 0014   VLDL 23 04/19/2010 0014   LDLCALC 132* 04/19/2010 0014    ABG No results found for this basename: phart, pco2, pco2art, po2, po2art, hco3, tco2, acidbasedef, o2sat     No results found for this basename: TSH   BNP (last 3 results) No results found for this basename: PROBNP,  in the last 8760 hours Cardiac Panel (last 3 results) No results found for this basename: CKTOTAL, CKMB, TROPONINI, RELINDX,  in the last 72 hours  Iron/TIBC/Ferritin No results found for this basename: iron, tibc, ferritin     EKG Orders placed in visit on 05/07/13  . EKG 12-LEAD     Prior Assessment and Plan Problem List as of 05/30/2013     Cardiovascular and Mediastinum   CAD, NATIVE VESSEL   Last Assessment & Plan   04/14/2011 Office Visit Written 04/14/2011  2:14 PM by Renella Cunas, MD     Stable. Encouraged to continue with current medications. I've given him a prescription for nitroglycerin tablets. I've encouraged him to stop smoking. I will see him back in the office in a year.    Orthostatic syncope     Nervous and Auditory   SAH (subarachnoid hemorrhage)   SDH (subdural hematoma)     Other   HYPERCHOLESTEROLEMIA  IIA   HYPERLIPIDEMIA   ALCOHOL ABUSE   TOBACCO USER   DEPRESSION   ING HERN W/GANGREN RECUR UNILAT/UNSPEC ING HERN   ABNORMAL STRESS ELECTROCARDIOGRAM   ABNORMAL CV (STRESS) TEST   HEAD TRAUMA   Bilateral lower extremity edema   Last Assessment & Plan   04/24/2012 Office Visit Written 04/24/2012  3:35 PM by Renella Cunas, MD     This is most likely secondary to some pulmonary disease from his long history of smoking as well as being overweight. I placed him on 20 mg of  furosemide and 20 mEq potassium. I've encouraged him to stop smoking. He will  [facility].        Imaging: No results found.

## 2013-05-30 NOTE — Patient Instructions (Signed)
Your physician recommends that you schedule a follow-up appointment in: 6 weeks     Your physician has recommended that you wear an event monitor. Event monitors are medical devices that record the heart's electrical activity. Doctors most often [place] these monitors to diagnose arrhythmias. Arrhythmias are problems with the speed or rhythm of the heartbeat. The monitor is a small, portable device. You can wear one while you do your normal daily activities. This is usually used to diagnose what is causing palpitations/syncope (passing out).    Your physician recommends that you continue on your current medications as directed. Please refer to the Current Medication list given to you today.   Thank you for choosing [facility] !

## 2013-05-30 NOTE — Assessment & Plan Note (Signed)
Advised to quit.  

## 2013-07-02 ENCOUNTER — Other Ambulatory Visit: Payer: Self-pay | Admitting: *Deleted

## 2013-07-02 DIAGNOSIS — R55 Syncope and collapse: Secondary | ICD-10-CM

## 2013-07-04 NOTE — Progress Notes (Signed)
This encounter was created in error - please disregard.

## 2013-07-10 ENCOUNTER — Encounter: Payer: Self-pay | Admitting: Adult Health

## 2013-07-10 NOTE — Progress Notes (Signed)
HPI: Erik [name] is a 58 year old patient of Dr. Harl Bowie we are following for ongoing assessment and management of near syncope. The patient was diagnosed with orthostatic syncope and not found to have a clear cause. One here he was that it was related to poor oral intake drinking daily caffinated tea. and also being on metoprolol. The Toprol was discontinued. On followup visit in April of 2015 he had recurrent episodes of syncope.  Cardiac monitor was placed on the patient to evaluate heart rate, arrhythmias, or bradycardia. This was completed and read by Dr. Domenic Polite on 07/02/2013. The patient's heart rate averaged between normal sinus rhythm to sinus tachycardia with a rate of 104 beats per minute. No significant arrhythmias, pauses, or bradycardia.  He had a CT scan in 2014, demonstrating,Interval resolution of posttraumatic subarachnoid and subdural blood on the left. Stable chronic right frontotemporal encephalomalacia. No acute intracranial findings. He is being followed by neurologist. [facility].  He comes today with complaints of cough congestion right lower rib discomfort, and sinus problems. He has been coughing a lot it is sore from that. He denies any tachycardia, near syncope, or dizziness. He denies excessive use of caffeine. He unfortunately continues to smoke. He denies use of alcohol or illicit drugs.     No Known Allergies  Current Outpatient Prescriptions  Medication Sig Dispense Refill  . betamethasone valerate (VALISONE) 0.1 % cream Apply 1 application topically daily as needed.      Marland Kitchen esomeprazole (NEXIUM) 40 MG capsule Take 40 mg by mouth daily before breakfast.      . FLUoxetine (PROZAC) 20 MG capsule Take 20 mg by mouth daily.      . rosuvastatin (CRESTOR) 20 MG tablet Take 1 tablet (20 mg total) by mouth daily.  90 tablet  3  . [DISCONTINUED] Dexlansoprazole (DEXILANT) 30 MG capsule Take 30 mg by mouth daily.         No current  facility-administered medications for this visit.    Past Medical History  Diagnosis Date  . Head trauma 2001    closed head injury; coma for 4 weeks  . Alcohol abuse   . Depression   . Abnormal stress echocardiogram   . Hypercholesterolemia   . Hypertension     Past Surgical History  Procedure Laterality Date  . Kidney surgery      >30 years ago  . Hernia repair  2012  . Cardiac cath    . Brain surgery  2001  . Head injury surgery      ROS: Review of systems complete and found to be negative unless listed above  PHYSICAL EXAM BP 142/74  Pulse 105  Ht 6\' 2"  (1.88 m)  Wt 223 lb (101.152 kg)  BMI 28.62 kg/m2  SpO2 98% .General: Well developed, well nourished, in no acute distress Head: Eyes PERRLA, No xanthomas.   Normal cephalic and atramatic  Lungs: Some wheezes are heard in the left base, with crackles noted. Frequent coughing with deep breaths. Heart: HRRR S1 S2, tachycardia without MRG.  Pulses are 2+ & equal.            No carotid bruit. No JVD.  No abdominal bruits. No femoral bruits. Abdomen: Bowel sounds are positive, abdomen soft and non-tender without masses or                  Hernia's noted. Msk:  Back normal, normal gait. Normal strength and tone for age. Extremities: No  clubbing, cyanosis or edema.  DP +1 Neuro: Alert and oriented X 3. Psych:  Good affect, responds appropriately  ASSESSMENT AND PLAN

## 2013-07-11 ENCOUNTER — Emergency Department (HOSPITAL_COMMUNITY)
Admission: EM | Admit: 2013-07-11 | Discharge: 2013-07-11 | Disposition: A | Discharge status: Home / Self Care | Payer: Self-pay | Attending: Emergency Medicine | Admitting: Emergency Medicine

## 2013-07-11 ENCOUNTER — Emergency Department (HOSPITAL_COMMUNITY): Payer: Self-pay

## 2013-07-11 ENCOUNTER — Ambulatory Visit (INDEPENDENT_AMBULATORY_CARE_PROVIDER_SITE_OTHER): Payer: Self-pay | Admitting: Adult Health

## 2013-07-11 ENCOUNTER — Encounter: Payer: Self-pay | Admitting: Adult Health

## 2013-07-11 ENCOUNTER — Encounter (HOSPITAL_COMMUNITY): Payer: Self-pay | Admitting: Emergency Medicine

## 2013-07-11 VITALS — BP 142/74 | HR 105 | Ht 74.0 in | Wt 223.0 lb

## 2013-07-11 DIAGNOSIS — Z79899 Other long term (current) drug therapy: Secondary | ICD-10-CM | POA: Insufficient documentation

## 2013-07-11 DIAGNOSIS — I951 Orthostatic hypotension: Secondary | ICD-10-CM

## 2013-07-11 DIAGNOSIS — I2510 Atherosclerotic heart disease of native coronary artery without angina pectoris: Secondary | ICD-10-CM

## 2013-07-11 DIAGNOSIS — E7800 Pure hypercholesterolemia, unspecified: Secondary | ICD-10-CM | POA: Insufficient documentation

## 2013-07-11 DIAGNOSIS — I10 Essential (primary) hypertension: Secondary | ICD-10-CM | POA: Insufficient documentation

## 2013-07-11 DIAGNOSIS — F3289 Other specified depressive episodes: Secondary | ICD-10-CM | POA: Insufficient documentation

## 2013-07-11 DIAGNOSIS — F329 Major depressive disorder, single episode, unspecified: Secondary | ICD-10-CM | POA: Insufficient documentation

## 2013-07-11 DIAGNOSIS — Z9889 Other specified postprocedural states: Secondary | ICD-10-CM | POA: Insufficient documentation

## 2013-07-11 DIAGNOSIS — Z791 Long term (current) use of non-steroidal anti-inflammatories (NSAID): Secondary | ICD-10-CM | POA: Insufficient documentation

## 2013-07-11 DIAGNOSIS — R55 Syncope and collapse: Secondary | ICD-10-CM | POA: Insufficient documentation

## 2013-07-11 DIAGNOSIS — J40 Bronchitis, not specified as acute or chronic: Secondary | ICD-10-CM

## 2013-07-11 DIAGNOSIS — F17200 Nicotine dependence, unspecified, uncomplicated: Secondary | ICD-10-CM | POA: Insufficient documentation

## 2013-07-11 DIAGNOSIS — R071 Chest pain on breathing: Secondary | ICD-10-CM | POA: Insufficient documentation

## 2013-07-11 DIAGNOSIS — J209 Acute bronchitis, unspecified: Secondary | ICD-10-CM | POA: Insufficient documentation

## 2013-07-11 DIAGNOSIS — R0789 Other chest pain: Secondary | ICD-10-CM

## 2013-07-11 DIAGNOSIS — Z87820 Personal history of traumatic brain injury: Secondary | ICD-10-CM | POA: Insufficient documentation

## 2013-07-11 DIAGNOSIS — Z792 Long term (current) use of antibiotics: Secondary | ICD-10-CM | POA: Insufficient documentation

## 2013-07-11 MED ORDER — NAPROXEN 250 MG PO TABS
500.0000 mg | ORAL_TABLET | Freq: Once | ORAL | Status: AC
  Administered 2013-07-11: 500 mg via ORAL
  Filled 2013-07-11: qty 2

## 2013-07-11 MED ORDER — CYCLOBENZAPRINE HCL 10 MG PO TABS: 10.0000 mg | ORAL_TABLET | Freq: Three times a day (TID) | ORAL | Status: DC | PRN

## 2013-07-11 MED ORDER — ALBUTEROL SULFATE (2.5 MG/3ML) 0.083% IN NEBU
2.5000 mg | INHALATION_SOLUTION | Freq: Once | RESPIRATORY_TRACT | Status: AC
  Administered 2013-07-11: 2.5 mg via RESPIRATORY_TRACT
  Filled 2013-07-11: qty 3

## 2013-07-11 MED ORDER — IPRATROPIUM BROMIDE 0.02 % IN SOLN: 0.5000 mg | Freq: Once | RESPIRATORY_TRACT | Status: DC

## 2013-07-11 MED ORDER — AMOXICILLIN 500 MG PO CAPS: 500.0000 mg | ORAL_CAPSULE | Freq: Three times a day (TID) | ORAL | Status: DC

## 2013-07-11 MED ORDER — ALBUTEROL SULFATE (2.5 MG/3ML) 0.083% IN NEBU: 5.0000 mg | INHALATION_SOLUTION | Freq: Once | RESPIRATORY_TRACT | Status: DC

## 2013-07-11 MED ORDER — IPRATROPIUM-ALBUTEROL 0.5-2.5 (3) MG/3ML IN SOLN
3.0000 mL | Freq: Once | RESPIRATORY_TRACT | Status: AC
  Administered 2013-07-11: 3 mL via RESPIRATORY_TRACT
  Filled 2013-07-11: qty 3

## 2013-07-11 MED ORDER — TRAMADOL HCL 50 MG PO TABS: 100.0000 mg | ORAL_TABLET | Freq: Four times a day (QID) | ORAL | Status: DC | PRN

## 2013-07-11 MED ORDER — CYCLOBENZAPRINE HCL 10 MG PO TABS
10.0000 mg | ORAL_TABLET | Freq: Once | ORAL | Status: AC
  Administered 2013-07-11: 10 mg via ORAL
  Filled 2013-07-11: qty 1

## 2013-07-11 MED ORDER — NAPROXEN SODIUM 550 MG PO TABS: 550.0000 mg | ORAL_TABLET | Freq: Two times a day (BID) | ORAL | Status: DC

## 2013-07-11 MED ORDER — ALBUTEROL SULFATE HFA 108 (90 BASE) MCG/ACT IN AERS: 2.0000 | INHALATION_SPRAY | RESPIRATORY_TRACT | Status: DC | PRN

## 2013-07-11 NOTE — ED Notes (Signed)
R lower rib cage pain began 3 days ago.  Saw cardiologist today who wanted him to come to ER to eval for PNA.  Report slight SOB, but has hx of SOB w/decreased BP.  Has been coughing and pain escalates with this.

## 2013-07-11 NOTE — Patient Instructions (Signed)
Your physician wants you to follow-up in: 1 year You will receive a reminder letter in the mail two months in advance. If you don't receive a letter, please call our office to schedule the follow-up appointment.    Your physician recommends that you continue on your current medications as directed. Please refer to the Current Medication list given to you today.     Thank you for choosing [facility] !  

## 2013-07-11 NOTE — Assessment & Plan Note (Signed)
No further react testing. The patient will continue on medical management only.

## 2013-07-11 NOTE — Assessment & Plan Note (Signed)
He is having a lot of coughing and congestion, and sinus drainage and pressure. Complains of some right rib pain. He is worried that he has pneumonia. He is advised to followup with his primary care provider at the [facility] clinic or be evaluated in the emergency room if he is unable to get an appointment.

## 2013-07-11 NOTE — Discharge Instructions (Signed)
Use the inhaler 2 puffs every 4-6 hrs for wheezing or shortness of breath. Take the antibiotic until gone. Take the naproxen and flexeril for pain. Take the tramadol with acetaminophen 1000 mg 4 times a day and continue the acetaminophen once the tramadol is gone.  Recheck if you get a fever, struggle to breathe or feel worse.    Chest Wall Pain Chest wall pain is pain felt in or around the chest bones and muscles. It may take up to 6 weeks to get better. It may take longer if you are active. Chest wall pain can happen on its own. Other times, things like germs, injury, coughing, or exercise can cause the pain. HOME CARE   Avoid activities that make you tired or cause pain. Try not to use your chest, belly (abdominal), or side muscles. Do not use heavy weights.  Put ice on the sore area.  Put ice in a plastic bag.  Place a towel between your skin and the bag.  Leave the ice on for 15-20 minutes for the first 2 days.  Only take medicine as told by your doctor. GET HELP RIGHT AWAY IF:   You have more pain or are very uncomfortable.  You have a fever.  Your chest pain gets worse.  You have new problems.  You feel sick to your stomach (nauseous) or throw up (vomit).  You start to sweat or feel lightheaded.  You have a cough with mucus (phlegm).  You cough up blood. MAKE SURE YOU:   Understand these instructions.  Will watch your condition.  Will get help right away if you are not doing well or get worse. Document Released: 07/12/2007 Document Revised: 04/17/2011 Document Reviewed: 09/19/2010 [facility] Patient Information 2014 [place], [place].  Bronchitis Bronchitis is swelling (inflammation) of the air tubes leading to your lungs (bronchi). This causes mucus and a cough. If the swelling gets bad, you may have trouble breathing. HOME CARE   Rest.  Drink enough fluids to keep your pee (urine) clear or pale yellow (unless you have a condition where you have to watch how  much you drink).  Only take medicine as told by your doctor. If you were given antibiotic medicines, finish them even if you start to feel better.  Avoid smoke, irritating chemicals, and strong smells. These make the problem worse. Quit smoking if you smoke. This helps your lungs heal faster.  Use a cool mist humidifier. Change the water in the humidifier every day. You can also sit in the bathroom with hot shower running for 5 10 minutes. Keep the door closed.  See your health care provider as told.  Wash your hands often. GET HELP IF: Your problems do not get better after 1 week. GET HELP RIGHT AWAY IF:   Your fever gets worse.  You have chills.  Your chest hurts.  Your problems breathing get worse.  You have blood in your mucus.  You pass out (faint).  You feel lightheaded.  You have a bad headache.  You throw up (vomit) again and again. MAKE SURE YOU:  Understand these instructions.  Will watch your condition.  Will get help right away if you are not doing well or get worse. Document Released: 07/12/2007 Document Revised: 11/13/2012 Document Reviewed: 09/17/2012 [facility] Patient Information 2014 [place], [place].

## 2013-07-11 NOTE — Assessment & Plan Note (Signed)
No further episodes of syncope near syncope or hypotension reported by the patient. No further cardiac evaluation will be completed at this time. We will see him again in one year. His symptoms persist he may need to followup with neurologist sooner. He is advised to avoid caffeine or any dehydrating substances. Blood pressure is currently well-controlled. He is not on antihypertensive medications.

## 2013-07-11 NOTE — ED Provider Notes (Addendum)
CSN: [id number]     Arrival date & time 07/11/13  1331 History  This chart was scribed for Janice Norrie, MD by Rolanda Lundborg, ED Scribe. This patient was seen in room APA09/APA09 and the patient's care was started at 2:31 PM.    Chief Complaint  Patient presents with  . rib pain    The history is provided by the patient. No language interpreter was used.   HPI Comments: Erik [name] is a 58 y.o. male who presents to the Emergency Department complaining of constant right rib pain onset 3 days ago that he woke up with in bed. He denies injuries or falls. He was sent here by [facility] cardiology for possible pneumonia. He also reports productive cough with clear sputum, nasal congestion, and rhinorrhea that started the same day. He has mild sore throat at times.  He states the rib pain makes him SOB when he walks. Stretching the right side and coughing makes the pain worse. Nothing makes it better. He denies fever, chills, nausea, vomiting, diarrhea. He reports wheezing and states he has had wheezing in the past. He has pain like this before when he had pneumonia and broken ribs.   He also reports intermittent syncopal spells over the last few months that are being followed by cardiology. He just got finished wearing a 30 day cardiac monitor. He had a syncopal episode while wearing a heart monitor and everything was normal. He has not had an episode in the last week.   PCP [facility] [place]   Past Medical History  Diagnosis Date  . Head trauma 2001    closed head injury; coma for 4 weeks  . Alcohol abuse   . Depression   . Abnormal stress echocardiogram   . Hypercholesterolemia   . Hypertension    Past Surgical History  Procedure Laterality Date  . Kidney surgery      >30 years ago  . Hernia repair  2012  . Cardiac cath    . Brain surgery  2001  . Head injury surgery     Family History  Problem Relation Age of Onset  . Coronary artery disease Other   .  Ataxia Neg Hx   . Chorea Neg Hx   . Dementia Neg Hx   . Mental retardation Neg Hx   . Migraines Neg Hx   . Multiple sclerosis Neg Hx   . Neurofibromatosis Neg Hx   . Neuropathy Neg Hx   . Parkinsonism Neg Hx   . Seizures Neg Hx   . Stroke Neg Hx    History  Substance Use Topics  . Smoking status: Current Every Day Smoker -- 0.30 packs/day    Types: Cigarettes  . Smokeless tobacco: Never Used     Comment: QUIT 6-7 WEEKS AGO  . Alcohol Use: Yes     Comment: beer occ   He smokes less than 1PPD.  He drinks beer occasionally.  He is unemployed currently.    Review of Systems  Constitutional: Negative for fever and chills.  HENT: Positive for congestion and rhinorrhea.   Respiratory: Positive for cough, shortness of breath and wheezing.   Gastrointestinal: Negative for nausea, vomiting and diarrhea.  Musculoskeletal: Positive for arthralgias (right rib).  Neurological: Positive for syncope.  All other systems reviewed and are negative.     Allergies  Review of patient's allergies indicates no known allergies.  Home Medications   Prior to Admission medications   Medication Sig Start Date  End Date Taking? Authorizing Provider  Cyanocobalamin (B-12 PO) Take 1 tablet by mouth daily.   Yes Historical Provider, MD  esomeprazole (NEXIUM) 40 MG capsule Take 40 mg by mouth daily before breakfast.   Yes Historical Provider, MD  FLUoxetine (PROZAC) 20 MG capsule Take 20 mg by mouth daily.   Yes Historical Provider, MD  rosuvastatin (CRESTOR) 20 MG tablet Take 1 tablet (20 mg total) by mouth daily. 07/13/11  Yes Lendon Colonel, NP  Thiamine HCl (B-1 PO) Take 1 tablet by mouth daily.   Yes Historical Provider, MD  albuterol (PROVENTIL HFA;VENTOLIN HFA) 108 (90 BASE) MCG/ACT inhaler Inhale 2 puffs into the lungs every 4 (four) hours as needed for wheezing or shortness of breath. 07/11/13   Janice Norrie, MD  amoxicillin (AMOXIL) 500 MG capsule Take 1 capsule (500 mg total) by mouth 3  (three) times daily. 07/11/13   Janice Norrie, MD  cyclobenzaprine (FLEXERIL) 10 MG tablet Take 1 tablet (10 mg total) by mouth 3 (three) times daily as needed (muscle pain). 07/11/13   Janice Norrie, MD  naproxen sodium (ANAPROX DS) 550 MG tablet Take 1 tablet (550 mg total) by mouth 2 (two) times daily with a meal. 07/11/13   Janice Norrie, MD  traMADol (ULTRAM) 50 MG tablet Take 2 tablets (100 mg total) by mouth every 6 (six) hours as needed. 07/11/13   Janice Norrie, MD   BP 141/81  Pulse 100  Temp(Src) 98.5 F (36.9 C) (Oral)  Resp 18  Ht 6\' 2"  (1.88 m)  Wt 223 lb 12.8 oz (101.515 kg)  BMI 28.72 kg/m2  SpO2 94%  Vital signs normal except borderline tachycardia  Physical Exam  Nursing note and vitals reviewed. Constitutional: He is oriented to person, place, and time. He appears well-developed and well-nourished.  Non-toxic appearance. He does not appear ill. No distress.  HENT:  Head: Normocephalic and atraumatic.  Right Ear: External ear normal.  Left Ear: External ear normal.  Nose: Nose normal. No mucosal edema or rhinorrhea.  Mouth/Throat: Oropharynx is clear and moist and mucous membranes are normal. No dental abscesses or uvula swelling.  Eyes: Conjunctivae and EOM are normal. Pupils are equal, round, and reactive to light.  Neck: Normal range of motion and full passive range of motion without pain. Neck supple.  Cardiovascular: Normal rate, regular rhythm and normal heart sounds.  Exam reveals no gallop and no friction rub.   No murmur heard. Pulmonary/Chest: Effort normal and breath sounds normal. No respiratory distress. He has no wheezes. He has no rhonchi. He has no rales. He exhibits no tenderness and no crepitus.  Coughs with deep breathing. Diminished breath sounds. Tender in his right lateral lower chest wall as shown. No bruising, no crepitance.   Abdominal: Soft. Normal appearance and bowel sounds are normal. He exhibits no distension. There is no tenderness. There is no rebound  and no guarding.    Musculoskeletal: Normal range of motion. He exhibits no edema and no tenderness.  Tender on the right lower rib cage from about tenth to the sixth rib.   Neurological: He is alert and oriented to person, place, and time. He has normal strength. No cranial nerve deficit.  Skin: Skin is warm, dry and intact. No rash noted. No erythema. No pallor.  Psychiatric: He has a normal mood and affect. His speech is normal and behavior is normal. His mood appears not anxious.    ED Course  Procedures (including critical care  time) Medications  cyclobenzaprine (FLEXERIL) tablet 10 mg (10 mg Oral Given 07/11/13 1515)  naproxen (NAPROSYN) tablet 500 mg (500 mg Oral Given 07/11/13 1515)  ipratropium-albuterol (DUONEB) 0.5-2.5 (3) MG/3ML nebulizer solution 3 mL (3 mLs Nebulization Given 07/11/13 1523)  albuterol (PROVENTIL) (2.5 MG/3ML) 0.083% nebulizer solution 2.5 mg (2.5 mg Nebulization Given 07/11/13 1523)    Recheck after nebulizer, states he is breathing deeper, still has mild cough.   DIAGNOSTIC STUDIES: Oxygen Saturation is 94% on RA, adequate by my interpretation.    COORDINATION OF CARE: 2:58 PM- Discussed treatment plan with pt which includes breathing treatment. Let him know x-rays were normal. Pt agrees to plan.    Labs Review Labs Reviewed - No data to display  Imaging Review Dg Ribs Unilateral W/chest Right  07/11/2013   CLINICAL DATA:  chest pain  EXAM: RIGHT RIBS AND CHEST - 3+ VIEW  COMPARISON:  Portal chest radiograph 08/08/2012  FINDINGS: No fracture or other bone lesions are seen involving the ribs. There is no evidence of pneumothorax or pleural effusion. Both lungs are clear. Heart size and mediastinal contours are within normal limits.  IMPRESSION: Negative.   Electronically Signed   By: Margaree Mackintosh M.D.   On: 07/11/2013 14:04     EKG Interpretation   Date/Time:  Friday July 11 2013 13:51:05 EDT Ventricular Rate:  91 PR Interval:  182 QRS Duration:  108 QT Interval:  362 QTC Calculation: 445 R Axis:   -69 Text Interpretation:  Normal sinus rhythm Left axis deviation Incomplete  right bundle branch block Septal infarct , age undetermined No significant  change since last tracing 26 Aug 2012 Confirmed by [facility]  MD-I, Jamise Pentland  ([id number]) on 07/11/2013 5:06:18 PM      MDM  patient presents with pleuritic tender right-sided chest wall pain with cough and wheezing. He had no infiltrate or obvious rib fracture on his x-rays. Patient is a smoker. He was given nebulizers which helped. He was discharged with inhaler and medications for chest wall pain. Because of his smoking history he was started on an antibiotic.    Final diagnoses:  Chest wall pain  Bronchitis    Discharge Medication List as of 07/11/2013  4:25 PM    START taking these medications   Details  albuterol (PROVENTIL HFA;VENTOLIN HFA) 108 (90 BASE) MCG/ACT inhaler Inhale 2 puffs into the lungs every 4 (four) hours as needed for wheezing or shortness of breath., Starting 07/11/2013, Until Discontinued, Print    amoxicillin (AMOXIL) 500 MG capsule Take 1 capsule (500 mg total) by mouth 3 (three) times daily., Starting 07/11/2013, Until Discontinued, Print    cyclobenzaprine (FLEXERIL) 10 MG tablet Take 1 tablet (10 mg total) by mouth 3 (three) times daily as needed (muscle pain)., Starting 07/11/2013, Until Discontinued, Print    naproxen sodium (ANAPROX DS) 550 MG tablet Take 1 tablet (550 mg total) by mouth 2 (two) times daily with a meal., Starting 07/11/2013, Until Discontinued, Print    traMADol (ULTRAM) 50 MG tablet Take 2 tablets (100 mg total) by mouth every 6 (six) hours as needed., Starting 07/11/2013, Until Discontinued, Print        Plan discharge   Rolland Porter, MD, FACEP   I personally performed the services described in this documentation, which was scribed in my presence. The recorded information has been reviewed and considered.  Rolland Porter, MD, FACEP   Janice Norrie,  MD 07/11/13 [place] Anabella Capshaw, MD 07/11/13 [phone]

## 2013-07-11 NOTE — Progress Notes (Deleted)
Name: Erik [name]    DOB: [birth year]  Age: 58 y.o.  MR#: [id number]       PCP:  No PCP Per Patient      Insurance: Payor: / No coverage found.  CC:    Chief Complaint  Patient presents with  . Near Syncope    VS Filed Vitals:   07/11/13 1255  BP: 142/74  Pulse: 105  Height: 6\' 2"  (1.88 m)  Weight: 223 lb (101.152 kg)  SpO2: 98%    Weights Current Weight  07/11/13 223 lb (101.152 kg)  05/30/13 227 lb (102.967 kg)  05/07/13 233 lb (105.688 kg)    Blood Pressure  BP Readings from Last 3 Encounters:  07/11/13 142/74  05/30/13 128/82  05/07/13 100/61     Admit date:  (Not on file) Last encounter with RMR:  05/30/2013   Allergy Review of patient's allergies indicates no known allergies.  Current Outpatient Prescriptions  Medication Sig Dispense Refill  . betamethasone valerate (VALISONE) 0.1 % cream Apply 1 application topically daily as needed.      Marland Kitchen esomeprazole (NEXIUM) 40 MG capsule Take 40 mg by mouth daily before breakfast.      . FLUoxetine (PROZAC) 20 MG capsule Take 20 mg by mouth daily.      . rosuvastatin (CRESTOR) 20 MG tablet Take 1 tablet (20 mg total) by mouth daily.  90 tablet  3  . [DISCONTINUED] Dexlansoprazole (DEXILANT) 30 MG capsule Take 30 mg by mouth daily.         No current facility-administered medications for this visit.    Discontinued Meds:    Medications Discontinued During This Encounter  Medication Reason  . methocarbamol (ROBAXIN) 500 MG tablet Error  . amitriptyline (ELAVIL) 25 MG tablet Error    Patient Active Problem List   Diagnosis Date Noted  . Orthostatic syncope 05/07/2013  . SAH (subarachnoid hemorrhage) 08/31/2012  . SDH (subdural hematoma) 08/31/2012  . Bilateral lower extremity edema 04/24/2012  . HYPERLIPIDEMIA 04/21/2009  . ING HERN W/GANGREN RECUR UNILAT/UNSPEC ING HERN 04/21/2009  . TOBACCO USER 03/25/2009  . CAD, NATIVE VESSEL 03/25/2009  . HYPERCHOLESTEROLEMIA  IIA 03/02/2009  . ALCOHOL ABUSE  03/02/2009  . DEPRESSION 03/02/2009  . ABNORMAL CV (STRESS) TEST 03/02/2009  . HEAD TRAUMA 03/02/2009  . ABNORMAL STRESS ELECTROCARDIOGRAM 02/23/2009    LABS    Component Value Date/Time   NA 133* 08/26/2012 0900   NA 136 08/08/2012 1012   NA 138 04/19/2012 1012   K 3.7 08/26/2012 0900   K 3.9 08/08/2012 1012   K 4.2 04/19/2012 1012   CL 96 08/26/2012 0900   CL 99 08/08/2012 1012   CL 102 04/19/2012 1012   CO2 30 08/26/2012 0900   CO2 25 08/08/2012 1012   CO2 29 04/19/2012 1012   GLUCOSE 117* 08/26/2012 0900   GLUCOSE 101* 08/08/2012 1012   GLUCOSE 100* 04/19/2012 1012   BUN 10 08/26/2012 0900   BUN 9 08/08/2012 1012   BUN 7 04/19/2012 1012   CREATININE 0.92 08/26/2012 0900   CREATININE 0.86 08/08/2012 1012   CREATININE 0.89 04/19/2012 1012   CALCIUM 9.0 08/26/2012 0900   CALCIUM 9.2 08/08/2012 1012   CALCIUM 9.0 04/19/2012 1012   GFRNONAA >90 08/26/2012 0900   GFRNONAA >90 08/08/2012 1012   GFRNONAA >90 04/19/2012 1012   GFRAA >90 08/26/2012 0900   GFRAA >90 08/08/2012 1012   GFRAA >90 04/19/2012 1012   CMP     Component Value Date/Time  NA 133* 08/26/2012 0900   K 3.7 08/26/2012 0900   CL 96 08/26/2012 0900   CO2 30 08/26/2012 0900   GLUCOSE 117* 08/26/2012 0900   BUN 10 08/26/2012 0900   CREATININE 0.92 08/26/2012 0900   CALCIUM 9.0 08/26/2012 0900   PROT 6.9 04/19/2010 0014   ALBUMIN 4.4 04/19/2010 0014   AST 11 04/19/2010 0014   ALT 10 04/19/2010 0014   ALKPHOS 55 04/19/2010 0014   BILITOT 0.4 04/19/2010 0014   GFRNONAA >90 08/26/2012 0900   GFRAA >90 08/26/2012 0900       Component Value Date/Time   WBC 6.7 08/26/2012 0900   WBC 8.2 08/08/2012 1012   WBC 6.0 04/19/2012 1012   HGB 14.3 08/26/2012 0900   HGB 15.8 08/08/2012 1012   HGB 13.3 04/19/2012 1012   HCT 41.8 08/26/2012 0900   HCT 45.0 08/08/2012 1012   HCT 39.9 04/19/2012 1012   MCV 91.1 08/26/2012 0900   MCV 89.6 08/08/2012 1012   MCV 93.7 04/19/2012 1012    Lipid Panel     Component Value Date/Time   CHOL 200 04/19/2010 0014   TRIG 116  04/19/2010 0014   HDL 45 04/19/2010 0014   CHOLHDL 4.4 Ratio 04/19/2010 0014   VLDL 23 04/19/2010 0014   LDLCALC 132* 04/19/2010 0014    ABG No results found for this basename: phart, pco2, pco2art, po2, po2art, hco3, tco2, acidbasedef, o2sat     No results found for this basename: TSH   BNP (last 3 results) No results found for this basename: PROBNP,  in the last 8760 hours Cardiac Panel (last 3 results) No results found for this basename: CKTOTAL, CKMB, TROPONINI, RELINDX,  in the last 72 hours  Iron/TIBC/Ferritin No results found for this basename: iron, tibc, ferritin     EKG Orders placed in visit on 07/02/13  . CARDIAC EVENT MONITOR     Prior Assessment and Plan Problem List as of 07/11/2013     Cardiovascular and Mediastinum   CAD, NATIVE VESSEL   Last Assessment & Plan   04/14/2011 Office Visit Written 04/14/2011  2:14 PM by Renella Cunas, MD     Stable. Encouraged to continue with current medications. I've given him a prescription for nitroglycerin tablets. I've encouraged him to stop smoking. I will see him back in the office in a year.    Orthostatic syncope   Last Assessment & Plan   05/30/2013 Office Visit Written 05/30/2013  2:58 PM by Lendon Colonel, NP     I have checked orthostatic BP in the office manually. He is not orthostatic BP 118/78 with HR of 86 lying, 116/78 HR of 100, Standing BP 118/78 HR 96.   I with place cardiac monitor on him for 30 days to evaluate arrthymgenic etiology of syncope. He will see [place] again in one month after monitor completion.       Nervous and Auditory   SAH (subarachnoid hemorrhage)   SDH (subdural hematoma)     Other   HYPERCHOLESTEROLEMIA  IIA   HYPERLIPIDEMIA   ALCOHOL ABUSE   TOBACCO USER   Last Assessment & Plan   05/30/2013 Office Visit Written 05/30/2013  2:59 PM by Lendon Colonel, NP     Advised to quit.     DEPRESSION   ING HERN W/GANGREN RECUR UNILAT/UNSPEC ING HERN   ABNORMAL STRESS ELECTROCARDIOGRAM    ABNORMAL CV (STRESS) TEST   HEAD TRAUMA   Bilateral lower extremity edema   Last Assessment &  Plan   04/24/2012 Office Visit Written 04/24/2012  3:35 PM by Renella Cunas, MD     This is most likely secondary to some pulmonary disease from his long history of smoking as well as being overweight. I placed him on 20 mg of furosemide and 20 mEq potassium. I've encouraged him to stop smoking. [facility].        Imaging: No results found.

## 2013-08-29 ENCOUNTER — Encounter: Payer: Self-pay | Admitting: Neurology

## 2013-08-29 ENCOUNTER — Ambulatory Visit (INDEPENDENT_AMBULATORY_CARE_PROVIDER_SITE_OTHER): Payer: Self-pay | Admitting: Neurology

## 2013-08-29 VITALS — BP 130/70 | HR 74 | Ht 74.0 in | Wt 219.5 lb

## 2013-08-29 DIAGNOSIS — S065X9A Traumatic subdural hemorrhage with loss of consciousness of unspecified duration, initial encounter: Secondary | ICD-10-CM

## 2013-08-29 DIAGNOSIS — I951 Orthostatic hypotension: Secondary | ICD-10-CM

## 2013-08-29 DIAGNOSIS — I6200 Nontraumatic subdural hemorrhage, unspecified: Secondary | ICD-10-CM

## 2013-08-29 DIAGNOSIS — I609 Nontraumatic subarachnoid hemorrhage, unspecified: Secondary | ICD-10-CM

## 2013-08-29 DIAGNOSIS — G44329 Chronic post-traumatic headache, not intractable: Secondary | ICD-10-CM

## 2013-08-29 DIAGNOSIS — S065XAA Traumatic subdural hemorrhage with loss of consciousness status unknown, initial encounter: Secondary | ICD-10-CM

## 2013-08-29 NOTE — Patient Instructions (Signed)
I want to find out what alternative medications they recommend instead of amitriptyline.  I will contact you with the replacement medication. Continue to limit pain relievers to no more than 2 days out of the week. Follow up in 6 months.

## 2013-08-29 NOTE — Progress Notes (Addendum)
NEUROLOGY FOLLOW UP OFFICE NOTE  Erik [name] [id number]  HISTORY OF PRESENT ILLNESS: Erik [name] is a 58 year old man with history of HTN, hyperlipidemia, PTSD, alcohol abuse, depression and  who presents for follow-up regarding post-concussion headache secondary to post-traumatic intracranial hemorrhage and gait unsteadiness.     UPDATE: Due to orthostatic hypotension, he was taken off of metoprolol. He has been doing much better. He continues to have headaches, approximately once or twice a week. They can last anywhere from a couple of hours to all day. He takes Excedrin when he gets them. He was taking amitriptyline 25 mg daily which helped with sleep, but has since discontinued this because his insurance wouldn't cover it. Labs from 02/28/13 revealed B12 241, MMA 0.40, B6 9.5, B1 7.  It was advised to start B12 and thiamine, which was stopped because he ran out.  HISTORY: Over the summer, was on the porch with a friend.  He stood up and felt lightheaded, and then passed out.  When he woke up, the paramedics were there.  He did not have any convulsions, tongue laceration or incontinence.  He elected not to go to the hospital.  Later that night, he began to feel shaky with cold sweat.  He presented a couple of days later to an outside ED, where CT of brain revealed small amount of subarachnoid blood at the high left parietal lobe, as well as small 3 mm adjacent subdural hematoma without mass effect.  There was also a non-displaced left parietal skull fracture.  He was subsequently transferred to [facility].  Serum etoh level was 217.  He was evaluated by neurosurgery.  Follow up CT scan was stable, so no surgical intervention was indicated.  EEG from 08/27/12 was normal.  2D Echo revealed EF 55-60% with no significant abnormalities.  Black outs were thought to be secondary to postural hypotension, rather than seizure.  For the headaches, he was given Depacon, hydrocodone and Toradol, which were  all ineffective.  He was then treated with Robaxin, dilaudid and amitriptyline 10mg  daily, and was discharged on these medications.  He was told not to take NSAIDs or acetaminophen given that he had an acute bleed.     After discharge, his severe left sided stabbing headache has decreased to a dull holocephalic headache.  It is constant.  He takes at least one hydrocodone in the morning.  He has not had any further black outs but on one occasion was presyncopal.  He has seen cardiology for this, as he does have extreme fluctuation in blood pressure.    Repeat Head CT performed 09/26/12 revealed resolution of bleed.     PAST MEDICAL HISTORY: Past Medical History  Diagnosis Date  . Head trauma 2001    closed head injury; coma for 4 weeks  . Alcohol abuse   . Depression   . Abnormal stress echocardiogram   . Hypercholesterolemia   . Hypertension     MEDICATIONS: Current Outpatient Prescriptions on File Prior to Visit  Medication Sig Dispense Refill  . albuterol (PROVENTIL HFA;VENTOLIN HFA) 108 (90 BASE) MCG/ACT inhaler Inhale 2 puffs into the lungs every 4 (four) hours as needed for wheezing or shortness of breath.  6.7 g  0  . esomeprazole (NEXIUM) 40 MG capsule Take 40 mg by mouth daily before breakfast.      . FLUoxetine (PROZAC) 20 MG capsule Take 20 mg by mouth daily.      . rosuvastatin (CRESTOR) 20 MG tablet  Take 1 tablet (20 mg total) by mouth daily.  90 tablet  3  . [DISCONTINUED] Dexlansoprazole (DEXILANT) 30 MG capsule Take 30 mg by mouth daily.         No current facility-administered medications on file prior to visit.    ALLERGIES: No Known Allergies  FAMILY HISTORY: Family History  Problem Relation Age of Onset  . Coronary artery disease Other   . Ataxia Neg Hx   . Chorea Neg Hx   . Dementia Neg Hx   . Mental retardation Neg Hx   . Migraines Neg Hx   . Multiple sclerosis Neg Hx   . Neurofibromatosis Neg Hx   . Neuropathy Neg Hx   . Parkinsonism Neg Hx   .  Seizures Neg Hx   . Stroke Neg Hx     SOCIAL HISTORY: History   Social History  . Marital Status: Divorced    Spouse Name: N/A    Number of Children: N/A  . Years of Education: N/A   Occupational History  . Full time    Social History Main Topics  . Smoking status: Current Every Day Smoker -- 0.30 packs/day    Types: Cigarettes  . Smokeless tobacco: Never Used     Comment: QUIT 6-7 WEEKS AGO  . Alcohol Use: Yes     Comment: beer occ  . Drug Use: Yes    Special: Marijuana     Comment: marijuana last 2012  . Sexual Activity: Not on file   Other Topics Concern  . Not on file   Social History Narrative   Single    REVIEW OF SYSTEMS: Constitutional: No fevers, chills, or sweats, no generalized fatigue, change in appetite Eyes: Sometimes double vision.  No eye pain Ear, nose and throat: Dizziness.  No hearing loss, ear pain, nasal congestion, sore throat Cardiovascular: No chest pain, palpitations Respiratory:  No shortness of breath at rest or with exertion, wheezes GastrointestinaI: No nausea, vomiting, diarrhea, abdominal pain, fecal incontinence Genitourinary:  No dysuria, urinary retention or frequency Musculoskeletal:  No neck pain, back pain Integumentary: No rash, pruritus, skin lesions Neurological: as above Psychiatric: insomnia Endocrine: No palpitations, fatigue, diaphoresis, mood swings, change in appetite, change in weight, increased thirst Hematologic/Lymphatic:  No anemia, purpura, petechiae. Allergic/Immunologic: no itchy/runny eyes, nasal congestion, recent allergic reactions, rashes  PHYSICAL EXAM: Filed Vitals:   08/29/13 0958  BP: 130/70  Pulse: 74   General: No acute distress Head:  Normocephalic/atraumatic Neck: supple, no paraspinal tenderness, full range of motion Heart:  Regular rate and rhythm Lungs:  Clear to auscultation bilaterally Back: No paraspinal tenderness Neurological Exam: alert and oriented to person, place, and time.  Attention span and concentration intact, recent and remote memory intact, fund of knowledge intact.  Speech fluent and not dysarthric, language intact.  EOMI with slight saccadic movements with tracking, otherwise CN II-XII intact. Fundi not visualized.  Bulk and tone normal, muscle strength 5/5 throughout.  Sensation to temperature reduced in the feet, vibration intact.  Deep tendon reflexes 2+ throughout, toes downgoing.  Finger to nose with some mild intention tremor, heel to shin without dysmetria.  Gait mildly unsteady.  Cannot tandem, Romberg with sway.  IMPRESSION: Post-concussion headache Postural hypotension History of traumatic SDH and SAH, resolved  PLAN: 1.  Will try to find out which alternative medications to amitriptyline were recommended and contact him regarding what to start.  2.  Advised to continue B12 3.  Follow up in 6 months.  30 minutes spent  with patient, over 50% spent counseling and coordinating care.  Metta Clines, DO  CC:  Carlyle Dolly, MD

## 2013-09-01 ENCOUNTER — Telehealth: Payer: Self-pay | Admitting: Family Medicine

## 2013-09-01 NOTE — Telephone Encounter (Signed)
I called patient. Wanted to verify if he had ins coverage. Dr. Tomi Likens was under the impression that patient wasn't able to get rx for Amitriptyline due to insurance not covering medication. Patient did state that he doesn't have any insurance and that he goes to the [facility] [place]. He states that he gets his medications free through them. He states when he was here on Friday he was out of Amitriptyline and he wasn't able to reach anybody in the clinic. He did tell me that he put a call in to them today to get a list of medications that they do provide for Dr. Tomi Likens to review and see what medications he could take for his dx. He states that he has an appt with them tomorrow to discuss this further.

## 2013-09-04 ENCOUNTER — Telehealth: Payer: Self-pay | Admitting: Neurology

## 2013-09-04 NOTE — Telephone Encounter (Signed)
Pt needs Amitriptyline script faxed to [facility] - fax # [phone] / Gayleen Orem.

## 2013-10-07 ENCOUNTER — Telehealth: Payer: Self-pay | Admitting: *Deleted

## 2013-10-07 NOTE — Telephone Encounter (Signed)
Pt needs to know if he can take viagra. Dr Romona Curls wanted him to check with [place] before he was given a RX for it.

## 2013-10-07 NOTE — Telephone Encounter (Signed)
Pt needs to know if he can take viagra before Dr. Romona Curls gave him RX. Please advise

## 2013-10-08 ENCOUNTER — Telehealth: Payer: Self-pay | Admitting: *Deleted

## 2013-10-08 NOTE — Telephone Encounter (Signed)
Pt requesting script for viagra from Dr. Harl Bowie will forward to him.

## 2013-10-08 NOTE — Telephone Encounter (Signed)
Viagra is fine from our standpoint. Dr Romona Curls may write Rx if he likes   Zandra Abts MD

## 2013-10-09 ENCOUNTER — Telehealth: Payer: Self-pay | Admitting: *Deleted

## 2013-10-09 NOTE — Telephone Encounter (Signed)
Since viagra is a noncardiac medication it would be best to get that Rx from his primary physician   Zandra Abts MD

## 2013-10-09 NOTE — Telephone Encounter (Signed)
Notified pt and pt would [facility] with [facility]

## 2013-10-10 ENCOUNTER — Encounter: Payer: Self-pay | Admitting: Cardiology

## 2013-11-28 ENCOUNTER — Ambulatory Visit (INDEPENDENT_AMBULATORY_CARE_PROVIDER_SITE_OTHER): Payer: Self-pay | Admitting: Urology

## 2013-11-28 DIAGNOSIS — R350 Frequency of micturition: Secondary | ICD-10-CM

## 2013-11-28 DIAGNOSIS — N5201 Erectile dysfunction due to arterial insufficiency: Secondary | ICD-10-CM

## 2013-11-28 DIAGNOSIS — N500 Atrophy of testis: Secondary | ICD-10-CM

## 2013-12-24 ENCOUNTER — Telehealth: Payer: Self-pay | Admitting: *Deleted

## 2013-12-24 NOTE — Telephone Encounter (Signed)
Received labs, in Dr. Nelly Laurence folder

## 2014-01-06 ENCOUNTER — Encounter: Payer: Self-pay | Admitting: Physician Assistant

## 2014-01-16 ENCOUNTER — Ambulatory Visit (INDEPENDENT_AMBULATORY_CARE_PROVIDER_SITE_OTHER): Payer: Self-pay | Admitting: Urology

## 2014-01-16 DIAGNOSIS — R350 Frequency of micturition: Secondary | ICD-10-CM

## 2014-03-06 ENCOUNTER — Encounter: Payer: Self-pay | Admitting: Neurology

## 2014-03-06 ENCOUNTER — Ambulatory Visit (INDEPENDENT_AMBULATORY_CARE_PROVIDER_SITE_OTHER): Payer: Self-pay | Admitting: Neurology

## 2014-03-06 VITALS — BP 138/70 | HR 88 | Resp 16 | Ht 74.0 in | Wt 219.7 lb

## 2014-03-06 DIAGNOSIS — Z8679 Personal history of other diseases of the circulatory system: Secondary | ICD-10-CM

## 2014-03-06 DIAGNOSIS — G44219 Episodic tension-type headache, not intractable: Secondary | ICD-10-CM

## 2014-03-06 DIAGNOSIS — R42 Dizziness and giddiness: Secondary | ICD-10-CM

## 2014-03-06 DIAGNOSIS — Z720 Tobacco use: Secondary | ICD-10-CM

## 2014-03-06 NOTE — Progress Notes (Signed)
NEUROLOGY FOLLOW UP OFFICE NOTE  Erik [name] [id number]  HISTORY OF PRESENT ILLNESS: Erik [name] is a 59 year old man with history of HTN, hyperlipidemia, PTSD, alcohol abuse, depression and who presents for follow-up regarding post-concussion headache secondary to post-traumatic intracranial hemorrhage and gait unsteadiness.     UPDATE: Headaches have improved.  They are an 8/10 intensity and lasts from 2 hours to all day, but occur only once every other week.  Tylenol doesn't help.  He still feels lightheaded and dizzy when he gets up. He was told that it no longer is related to orthostatic hypotension and likely chronic sequela of the head injury.  HISTORY: Over the summer, was on the porch with a friend.  He stood up and felt lightheaded, and then passed out.  When he woke up, the paramedics were there.  He did not have any convulsions, tongue laceration or incontinence.  He elected not to go to the hospital.  Later that night, he began to feel shaky with cold sweat.  He presented a couple of days later to an outside ED, where CT of brain revealed small amount of subarachnoid blood at the high left parietal lobe, as well as small 3 mm adjacent subdural hematoma without mass effect.  There was also a non-displaced left parietal skull fracture.  He was subsequently transferred to [facility].  Serum etoh level was 217.  He was evaluated by neurosurgery.  Follow up CT scan was stable, so no surgical intervention was indicated.  EEG from 08/27/12 was normal.  2D Echo revealed EF 55-60% with no significant abnormalities.  Black outs were thought to be secondary to postural hypotension, rather than seizure.  For the headaches, he was given Depacon, hydrocodone and Toradol, which were all ineffective.  He was then treated with Robaxin, dilaudid and amitriptyline 10mg  daily, and was discharged on these medications.  He was told not to take NSAIDs or acetaminophen given that he had an acute bleed.      After discharge, his severe left sided stabbing headache has decreased to a dull holocephalic headache.  It is constant.  He takes at least one hydrocodone in the morning.  He has not had any further black outs but on one occasion was presyncopal.  He has seen cardiology for this, as he does have extreme fluctuation in blood pressure.    Repeat Head CT performed 09/26/12 revealed resolution of bleed.     Due to orthostatic hypotension, he was taken off of metoprolol.   For headaches, he was taking amitriptyline, which helped but his insurance wouldn't cover it.  PAST MEDICAL HISTORY: Past Medical History  Diagnosis Date  . Head trauma 2001    closed head injury; coma for 4 weeks  . Alcohol abuse   . Depression   . Abnormal stress echocardiogram   . Hypercholesterolemia   . Hypertension     MEDICATIONS: Current Outpatient Prescriptions on File Prior to Visit  Medication Sig Dispense Refill  . albuterol (PROVENTIL HFA;VENTOLIN HFA) 108 (90 BASE) MCG/ACT inhaler Inhale 2 puffs into the lungs every 4 (four) hours as needed for wheezing or shortness of breath. 6.7 g 0  . aspirin 81 MG tablet Take 81 mg by mouth daily.    Marland Kitchen esomeprazole (NEXIUM) 40 MG capsule Take 40 mg by mouth daily before breakfast.    . FLUoxetine (PROZAC) 20 MG capsule Take 20 mg by mouth daily.    . rosuvastatin (CRESTOR) 20 MG tablet Take 1 tablet (  20 mg total) by mouth daily. 90 tablet 3  . [DISCONTINUED] Dexlansoprazole (DEXILANT) 30 MG capsule Take 30 mg by mouth daily.       No current facility-administered medications on file prior to visit.    ALLERGIES: No Known Allergies  FAMILY HISTORY: Family History  Problem Relation Age of Onset  . Coronary artery disease Other   . Ataxia Neg Hx   . Chorea Neg Hx   . Dementia Neg Hx   . Mental retardation Neg Hx   . Migraines Neg Hx   . Multiple sclerosis Neg Hx   . Neurofibromatosis Neg Hx   . Neuropathy Neg Hx   . Parkinsonism Neg Hx   . Seizures Neg  Hx   . Stroke Neg Hx     SOCIAL HISTORY: History   Social History  . Marital Status: Divorced    Spouse Name: N/A    Number of Children: N/A  . Years of Education: N/A   Occupational History  . Full time    Social History Main Topics  . Smoking status: Current Every Day Smoker -- 0.30 packs/day    Types: Cigarettes  . Smokeless tobacco: Never Used  . Alcohol Use: 0.0 oz/week    0 Not specified per week     Comment: beer occ  . Drug Use: Yes    Special: Marijuana     Comment: marijuana last 2012  . Sexual Activity:    Partners: Female   Other Topics Concern  . Not on file   Social History Narrative   Single    REVIEW OF SYSTEMS: Constitutional: No fevers, chills, or sweats, no generalized fatigue, change in appetite Eyes: No visual changes, double vision, eye pain Ear, nose and throat: No hearing loss, ear pain, nasal congestion, sore throat Cardiovascular: No chest pain, palpitations Respiratory:  No shortness of breath at rest or with exertion, wheezes GastrointestinaI: No nausea, vomiting, diarrhea, abdominal pain, fecal incontinence Genitourinary:  No dysuria, urinary retention or frequency Musculoskeletal:  No neck pain, back pain Integumentary: No rash, pruritus, skin lesions Neurological: as above Psychiatric: No depression, insomnia, anxiety Endocrine: No palpitations, fatigue, diaphoresis, mood swings, change in appetite, change in weight, increased thirst Hematologic/Lymphatic:  No anemia, purpura, petechiae. Allergic/Immunologic: no itchy/runny eyes, nasal congestion, recent allergic reactions, rashes  PHYSICAL EXAM: Filed Vitals:   03/06/14 0953  BP: 138/70  Pulse: 88  Resp: 16  Orthostatic vitals negative.  General: No acute distress Head:  Normocephalic/atraumatic Eyes:  Fundoscopic exam unremarkable without vessel changes, exudates, hemorrhages or papilledema. Neck: supple, no paraspinal tenderness, full range of motion Heart:  Regular  rate and rhythm Lungs:  Clear to auscultation bilaterally Back: No paraspinal tenderness Neurological Exam: alert and oriented to person, place, and time. Attention span and concentration intact, recent and remote memory intact, fund of knowledge intact.  Speech fluent and not dysarthric, language intact.  CN II-XII intact. Fundi not visualized.  Bulk and tone normal, muscle strength 5/5 throughout.  Deep tendon reflexes 2+ throughout, toes downgoing.  Finger to nose with some mild intention tremor, heel to shin without dysmetria.  Gait mildly unsteady.    IMPRESSION: Tension-type headaches, improved Dizziness.  At this point, he does not appear to be orthostatic and likely is residual symptoms from head injury.   History of traumatic SDH Tobacco user  PLAN: 1.  Continue aspirin 81mg  daily 2.  Continue to work on quitting smoking completely 3.  Follow up in 6 months.  15 minutes spent with  patient, over 50% spent discussing symptoms and coordinating care.  Metta Clines, DO  CC: Carlyle Dolly, MD

## 2014-03-06 NOTE — Patient Instructions (Addendum)
1.  Continue aspirin 81mg  daily 2.  Continue to work on quitting smoking completely 3.  Follow up in 6 months.

## 2014-04-17 ENCOUNTER — Ambulatory Visit (INDEPENDENT_AMBULATORY_CARE_PROVIDER_SITE_OTHER): Payer: Self-pay | Admitting: Urology

## 2014-04-17 DIAGNOSIS — R350 Frequency of micturition: Secondary | ICD-10-CM

## 2014-04-17 DIAGNOSIS — N5201 Erectile dysfunction due to arterial insufficiency: Secondary | ICD-10-CM

## 2014-04-23 ENCOUNTER — Encounter: Payer: Self-pay | Admitting: Adult Health

## 2014-07-13 ENCOUNTER — Encounter: Payer: Self-pay | Admitting: Adult Health

## 2014-07-13 ENCOUNTER — Ambulatory Visit (INDEPENDENT_AMBULATORY_CARE_PROVIDER_SITE_OTHER): Payer: Self-pay | Admitting: Adult Health

## 2014-07-13 VITALS — BP 146/90 | HR 86 | Ht 74.0 in | Wt 219.0 lb

## 2014-07-13 DIAGNOSIS — R42 Dizziness and giddiness: Secondary | ICD-10-CM

## 2014-07-13 DIAGNOSIS — Z136 Encounter for screening for cardiovascular disorders: Secondary | ICD-10-CM

## 2014-07-13 NOTE — Patient Instructions (Signed)
Your physician wants you to follow-up in: 1 year with Jory Sims, NP.  You will receive a reminder letter in the mail two months in advance. If you don't receive a letter, please call our office to schedule the follow-up appointment.  Your physician recommends that you return for lab work in: (BMET, Mg level, TSH)   Your physician recommends that you continue on your current medications as directed. Please refer to the Current Medication list given to you today.  Thank you for choosing [facility]!

## 2014-07-13 NOTE — Progress Notes (Deleted)
Name: Erik [name]    DOB: 05-Sep-1955  Age: 59 y.o.  MR#: [id number]       PCP:  No PCP Per Patient      Insurance: Payor: / No coverage found.  CC:    Chief Complaint  Patient presents with  . Loss of Consciousness  . Hyperlipidemia    VS Filed Vitals:   07/13/14 1340  BP: 146/90  Pulse: 86  Height: 6\' 2"  (1.88 m)  Weight: 219 lb (99.338 kg)  SpO2: 93%    Weights Current Weight  07/13/14 219 lb (99.338 kg)  03/06/14 219 lb 11.2 oz (99.655 kg)  08/29/13 219 lb 8 oz (99.565 kg)    Blood Pressure  BP Readings from Last 3 Encounters:  07/13/14 146/90  03/06/14 138/70  08/29/13 130/70     Admit date:  (Not on file) Last encounter with RMR:  Visit date not found   Allergy Review of patient's allergies indicates no known allergies.  Current Outpatient Prescriptions  Medication Sig Dispense Refill  . albuterol (PROVENTIL HFA;VENTOLIN HFA) 108 (90 BASE) MCG/ACT inhaler Inhale 2 puffs into the lungs every 4 (four) hours as needed for wheezing or shortness of breath. 6.7 g 0  . aspirin 81 MG tablet Take 81 mg by mouth daily.    . CRESTOR 40 MG tablet Take 40 mg by mouth at bedtime.  2  . FLUoxetine (PROZAC) 20 MG capsule Take 20 mg by mouth daily.    Marland Kitchen omeprazole (PRILOSEC) 20 MG capsule   3  . VIAGRA 100 MG tablet   11  . [DISCONTINUED] Dexlansoprazole (DEXILANT) 30 MG capsule Take 30 mg by mouth daily.       No current facility-administered medications for this visit.    Discontinued Meds:    Medications Discontinued During This Encounter  Medication Reason  . esomeprazole (NEXIUM) 40 MG capsule Error  . rosuvastatin (CRESTOR) 20 MG tablet Dose change    Patient Active Problem List   Diagnosis Date Noted  . Bronchitis 07/11/2013  . Orthostatic syncope 05/07/2013  . SAH (subarachnoid hemorrhage) 08/31/2012  . SDH (subdural hematoma) 08/31/2012  . Bilateral lower extremity edema 04/24/2012  . HYPERLIPIDEMIA 04/21/2009  . ING HERN W/GANGREN RECUR  UNILAT/UNSPEC ING HERN 04/21/2009  . TOBACCO USER 03/25/2009  . CAD, NATIVE VESSEL 03/25/2009  . HYPERCHOLESTEROLEMIA  IIA 03/02/2009  . ALCOHOL ABUSE 03/02/2009  . DEPRESSION 03/02/2009  . ABNORMAL CV (STRESS) TEST 03/02/2009  . HEAD TRAUMA 03/02/2009  . ABNORMAL STRESS ELECTROCARDIOGRAM 02/23/2009    LABS    Component Value Date/Time   NA 133* 08/26/2012 0900   NA 136 08/08/2012 1012   NA 138 04/19/2012 1012   K 3.7 08/26/2012 0900   K 3.9 08/08/2012 1012   K 4.2 04/19/2012 1012   CL 96 08/26/2012 0900   CL 99 08/08/2012 1012   CL 102 04/19/2012 1012   CO2 30 08/26/2012 0900   CO2 25 08/08/2012 1012   CO2 29 04/19/2012 1012   GLUCOSE 117* 08/26/2012 0900   GLUCOSE 101* 08/08/2012 1012   GLUCOSE 100* 04/19/2012 1012   BUN 10 08/26/2012 0900   BUN 9 08/08/2012 1012   BUN 7 04/19/2012 1012   CREATININE 0.92 08/26/2012 0900   CREATININE 0.86 08/08/2012 1012   CREATININE 0.89 04/19/2012 1012   CALCIUM 9.0 08/26/2012 0900   CALCIUM 9.2 08/08/2012 1012   CALCIUM 9.0 04/19/2012 1012   GFRNONAA >90 08/26/2012 0900   GFRNONAA >90 08/08/2012 1012   GFRNONAA >  90 04/19/2012 1012   GFRAA >90 08/26/2012 0900   GFRAA >90 08/08/2012 1012   GFRAA >90 04/19/2012 1012   CMP     Component Value Date/Time   NA 133* 08/26/2012 0900   K 3.7 08/26/2012 0900   CL 96 08/26/2012 0900   CO2 30 08/26/2012 0900   GLUCOSE 117* 08/26/2012 0900   BUN 10 08/26/2012 0900   CREATININE 0.92 08/26/2012 0900   CALCIUM 9.0 08/26/2012 0900   PROT 6.9 04/19/2010 0014   ALBUMIN 4.4 04/19/2010 0014   AST 11 04/19/2010 0014   ALT 10 04/19/2010 0014   ALKPHOS 55 04/19/2010 0014   BILITOT 0.4 04/19/2010 0014   GFRNONAA >90 08/26/2012 0900   GFRAA >90 08/26/2012 0900       Component Value Date/Time   WBC 6.7 08/26/2012 0900   WBC 8.2 08/08/2012 1012   WBC 6.0 04/19/2012 1012   HGB 14.3 08/26/2012 0900   HGB 15.8 08/08/2012 1012   HGB 13.3 04/19/2012 1012   HCT 41.8 08/26/2012 0900   HCT  45.0 08/08/2012 1012   HCT 39.9 04/19/2012 1012   MCV 91.1 08/26/2012 0900   MCV 89.6 08/08/2012 1012   MCV 93.7 04/19/2012 1012    Lipid Panel     Component Value Date/Time   CHOL 200 04/19/2010 0014   TRIG 116 04/19/2010 0014   HDL 45 04/19/2010 0014   CHOLHDL 4.4 Ratio 04/19/2010 0014   VLDL 23 04/19/2010 0014   LDLCALC 132* 04/19/2010 0014    ABG No results found for: PHART, PCO2ART, PO2ART, HCO3, TCO2, ACIDBASEDEF, O2SAT   No results found for: TSH BNP (last 3 results) No results for input(s): BNP in the last 8760 hours.  ProBNP (last 3 results) No results for input(s): PROBNP in the last 8760 hours.  Cardiac Panel (last 3 results) No results for input(s): CKTOTAL, CKMB, TROPONINI, RELINDX in the last 72 hours.  Iron/TIBC/Ferritin/ %Sat No results found for: IRON, TIBC, FERRITIN, IRONPCTSAT   EKG Orders placed or performed in visit on 07/13/14  . EKG 12-Lead     Prior Assessment and Plan Problem List as of 07/13/2014      Cardiovascular and Mediastinum   CAD, NATIVE VESSEL   Last Assessment & Plan 07/11/2013 Office Visit Written 07/11/2013  1:20 PM by Lendon Colonel, NP    No further react testing. The patient will continue on medical management only.      Orthostatic syncope   Last Assessment & Plan 07/11/2013 Office Visit Written 07/11/2013  1:19 PM by Lendon Colonel, NP    No further episodes of syncope near syncope or hypotension reported by the patient. No further cardiac evaluation will be completed at this time. We will see him again in one year. His symptoms persist he may need to followup with neurologist sooner. He is advised to avoid caffeine or any dehydrating substances. Blood pressure is currently well-controlled. He is not on antihypertensive medications.        Respiratory   Bronchitis   Last Assessment & Plan 07/11/2013 Office Visit Written 07/11/2013  1:21 PM by Lendon Colonel, NP    He is having a lot of coughing and congestion, and sinus  drainage and pressure. Complains of some right rib pain. He is worried that he has pneumonia. He is advised to followup with [facility] or be evaluated in the emergency room if he is unable to get an appointment.  Nervous and Auditory   SAH (subarachnoid hemorrhage)   SDH (subdural hematoma)     Other   HYPERCHOLESTEROLEMIA  IIA   HYPERLIPIDEMIA   ALCOHOL ABUSE   TOBACCO USER   Last Assessment & Plan 05/30/2013 Office Visit Written 05/30/2013  2:59 PM by Lendon Colonel, NP    Advised to quit.       DEPRESSION   ING HERN W/GANGREN RECUR UNILAT/UNSPEC ING HERN   ABNORMAL STRESS ELECTROCARDIOGRAM   ABNORMAL CV (STRESS) TEST   HEAD TRAUMA   Bilateral lower extremity edema   Last Assessment & Plan 04/24/2012 Office Visit Written 04/24/2012  3:35 PM by Renella Cunas, MD    This is most likely secondary to some pulmonary disease from his long history of smoking as well as being overweight. I placed him on 20 mg of furosemide and 20 mEq potassium. I've encouraged him to stop smoking. [facility].          Imaging: No results found.

## 2014-07-13 NOTE — Progress Notes (Signed)
Cardiology Office Note   Date:  07/13/2014   ID:  Markos, Theil 12-28-1955, MRN [id number]  PCP:  No PCP Per Patient  Cardiologist:  Cloria Spring, NP   Chief Complaint  Patient presents with  . Loss of Consciousness  . Hyperlipidemia      History of Present Illness: Erik [name] is a 59 y.o. male who presents for ongoing assessment and management of near syncope. A cardiac monitor that did not demonstrate arrhythmias or bradycardia. He was last seen in the office on 07/10/2013 and was not recommended for further testing. He is here for one year follow up.   He comes today stating he still has episodes of dizziness. He was working out in the heat yesterday and felt very weak. Went inside and drank water. Still feels weak. Was in the hospital about 6 months ago for syncope.    Past Medical History  Diagnosis Date  . Head trauma 2001    closed head injury; coma for 4 weeks  . Alcohol abuse   . Depression   . Abnormal stress echocardiogram   . Hypercholesterolemia   . Hypertension     Past Surgical History  Procedure Laterality Date  . Kidney surgery      >30 years ago  . Hernia repair  2012  . Cardiac cath    . Brain surgery  2001  . Head injury surgery       Current Outpatient Prescriptions  Medication Sig Dispense Refill  . albuterol (PROVENTIL HFA;VENTOLIN HFA) 108 (90 BASE) MCG/ACT inhaler Inhale 2 puffs into the lungs every 4 (four) hours as needed for wheezing or shortness of breath. 6.7 g 0  . aspirin 81 MG tablet Take 81 mg by mouth daily.    . CRESTOR 40 MG tablet Take 40 mg by mouth at bedtime.  2  . FLUoxetine (PROZAC) 20 MG capsule Take 20 mg by mouth daily.    Marland Kitchen omeprazole (PRILOSEC) 20 MG capsule   3  . VIAGRA 100 MG tablet   11  . [DISCONTINUED] Dexlansoprazole (DEXILANT) 30 MG capsule Take 30 mg by mouth daily.       No current facility-administered medications for this visit.    Allergies:   Review of patient's allergies  indicates no known allergies.    Social History:  The patient  reports that he has been smoking Cigarettes.  He has been smoking about 0.30 packs per day. He has never used smokeless tobacco. He reports that he drinks alcohol. He reports that he uses illicit drugs (Marijuana).   Family History:  The patient's family history includes Coronary artery disease in his other. There is no history of Ataxia, Chorea, Dementia, Mental retardation, Migraines, Multiple sclerosis, Neurofibromatosis, Neuropathy, Parkinsonism, Seizures, or Stroke.    ROS: .   All other systems are reviewed and negative.Unless otherwise mentioned in H&P above.   PHYSICAL EXAM: VS:  BP 146/90 mmHg  Pulse 86  Ht 6\' 2"  (1.88 m)  Wt 219 lb (99.338 kg)  BMI 28.11 kg/m2  SpO2 93% , BMI Body mass index is 28.11 kg/(m^2). GEN: Well nourished, well developed, in no acute distress HEENT: normal Neck: no JVD, carotid bruits, or masses Cardiac: RRR; no murmurs, rubs, or gallops,no edema  Respiratory:  clear to auscultation bilaterally, normal work of breathing GI: soft, nontender, nondistended, + BS MS: no deformity or atrophy Skin: warm and dry, no rash Neuro:  Strength and sensation are intact, trembling Psych: euthymic mood, full  affect   EKG:   The ekg ordered today demonstrates NSR with Left Axis Deviation   Recent Labs: No results found for requested labs within last 365 days.    Lipid Panel    Component Value Date/Time   CHOL 200 04/19/2010 0014   TRIG 116 04/19/2010 0014   HDL 45 04/19/2010 0014   CHOLHDL 4.4 Ratio 04/19/2010 0014   VLDL 23 04/19/2010 0014   LDLCALC 132* 04/19/2010 0014      Wt Readings from Last 3 Encounters:  07/13/14 219 lb (99.338 kg)  03/06/14 219 lb 11.2 oz (99.655 kg)  08/29/13 219 lb 8 oz (99.565 kg)      Other studies Reviewed: Additional studies/ records that were reviewed today include: None Review of the above records demonstrates: N/A   ASSESSMENT AND  PLAN:  1. Hypertension: BP is controlled. I will check a BMET, Mg and TSH to evaluate his status, especially with near syncope. He has not gotten a definite diagnosis for this. Sees neurologist.   2. Dizziness: I have advised him to increase fluid intake and to eat regularly.   Current medicines are reviewed at length with the patient today.    Labs/ tests ordered today include: BMET, Mg+, and TSH  Orders Placed This Encounter  Procedures  . EKG 12-Lead     Disposition:   FU with 1 year Signed, Jory Sims, NP  07/13/2014 2:00 PM    [facility] [address] [place], [place] [address] Phone: [phone]; Fax: [phone]

## 2014-07-14 ENCOUNTER — Other Ambulatory Visit: Payer: Self-pay | Admitting: Adult Health

## 2014-07-14 LAB — BASIC METABOLIC PANEL
BUN: 11 mg/dL (ref 6–23)
CO2: 28 mEq/L (ref 19–32)
CREATININE: 0.87 mg/dL (ref 0.50–1.35)
Calcium: 9.2 mg/dL (ref 8.4–10.5)
Chloride: 95 mEq/L — ABNORMAL LOW (ref 96–112)
Glucose, Bld: 90 mg/dL (ref 70–99)
POTASSIUM: 3.8 meq/L (ref 3.5–5.3)
Sodium: 136 mEq/L (ref 135–145)

## 2014-07-14 LAB — MAGNESIUM: Magnesium: 1.7 mg/dL (ref 1.5–2.5)

## 2014-07-14 LAB — TSH: TSH: 3.262 u[IU]/mL (ref 0.350–4.500)

## 2014-07-15 ENCOUNTER — Telehealth: Payer: Self-pay

## 2014-07-15 NOTE — Telephone Encounter (Signed)
-----   Message from Lendon Colonel, NP sent at 07/15/2014  9:43 AM EDT ----- Begin magnesium 400 mg daily as Mag is low at 1.7. Other labs are WNL for cardiology. Important he takes this supplement as Magnesium needs to be at 2.0.

## 2014-07-15 NOTE — Telephone Encounter (Signed)
Patient made aware of labs, will start daily magnesium

## 2014-08-14 ENCOUNTER — Emergency Department (HOSPITAL_COMMUNITY): Payer: Self-pay

## 2014-08-14 ENCOUNTER — Emergency Department (HOSPITAL_COMMUNITY)
Admission: EM | Admit: 2014-08-14 | Discharge: 2014-08-14 | Disposition: A | Discharge status: Home / Self Care | Payer: Self-pay | Attending: Emergency Medicine | Admitting: Emergency Medicine

## 2014-08-14 ENCOUNTER — Encounter (HOSPITAL_COMMUNITY): Payer: Self-pay

## 2014-08-14 DIAGNOSIS — Z8639 Personal history of other endocrine, nutritional and metabolic disease: Secondary | ICD-10-CM | POA: Insufficient documentation

## 2014-08-14 DIAGNOSIS — F10920 Alcohol use, unspecified with intoxication, uncomplicated: Secondary | ICD-10-CM

## 2014-08-14 DIAGNOSIS — Z7982 Long term (current) use of aspirin: Secondary | ICD-10-CM | POA: Insufficient documentation

## 2014-08-14 DIAGNOSIS — Y9389 Activity, other specified: Secondary | ICD-10-CM | POA: Insufficient documentation

## 2014-08-14 DIAGNOSIS — Z79899 Other long term (current) drug therapy: Secondary | ICD-10-CM | POA: Insufficient documentation

## 2014-08-14 DIAGNOSIS — Y92007 Garden or yard of unspecified non-institutional (private) residence as the place of occurrence of the external cause: Secondary | ICD-10-CM | POA: Insufficient documentation

## 2014-08-14 DIAGNOSIS — F329 Major depressive disorder, single episode, unspecified: Secondary | ICD-10-CM | POA: Insufficient documentation

## 2014-08-14 DIAGNOSIS — W19XXXA Unspecified fall, initial encounter: Secondary | ICD-10-CM

## 2014-08-14 DIAGNOSIS — I10 Essential (primary) hypertension: Secondary | ICD-10-CM | POA: Insufficient documentation

## 2014-08-14 DIAGNOSIS — W1839XA Other fall on same level, initial encounter: Secondary | ICD-10-CM | POA: Insufficient documentation

## 2014-08-14 DIAGNOSIS — S0990XA Unspecified injury of head, initial encounter: Secondary | ICD-10-CM | POA: Insufficient documentation

## 2014-08-14 DIAGNOSIS — F10120 Alcohol abuse with intoxication, uncomplicated: Secondary | ICD-10-CM | POA: Insufficient documentation

## 2014-08-14 DIAGNOSIS — Y998 Other external cause status: Secondary | ICD-10-CM | POA: Insufficient documentation

## 2014-08-14 DIAGNOSIS — Z720 Tobacco use: Secondary | ICD-10-CM | POA: Insufficient documentation

## 2014-08-14 LAB — COMPREHENSIVE METABOLIC PANEL
ALT: 13 U/L — AB (ref 17–63)
AST: 17 U/L (ref 15–41)
Albumin: 3.8 g/dL (ref 3.5–5.0)
Alkaline Phosphatase: 53 U/L (ref 38–126)
Anion gap: 9 (ref 5–15)
BUN: 12 mg/dL (ref 6–20)
CO2: 29 mmol/L (ref 22–32)
Calcium: 7.9 mg/dL — ABNORMAL LOW (ref 8.9–10.3)
Chloride: 105 mmol/L (ref 101–111)
Creatinine, Ser: 0.89 mg/dL (ref 0.61–1.24)
GLUCOSE: 83 mg/dL (ref 65–99)
POTASSIUM: 4.1 mmol/L (ref 3.5–5.1)
Sodium: 143 mmol/L (ref 135–145)
TOTAL PROTEIN: 6.7 g/dL (ref 6.5–8.1)
Total Bilirubin: 0.4 mg/dL (ref 0.3–1.2)

## 2014-08-14 LAB — CBC WITH DIFFERENTIAL/PLATELET
BASOS PCT: 1 % (ref 0–1)
Basophils Absolute: 0.1 10*3/uL (ref 0.0–0.1)
Eosinophils Absolute: 0.2 10*3/uL (ref 0.0–0.7)
Eosinophils Relative: 4 % (ref 0–5)
HCT: 42.8 % (ref 39.0–52.0)
HEMOGLOBIN: 14.5 g/dL (ref 13.0–17.0)
LYMPHS PCT: 44 % (ref 12–46)
Lymphs Abs: 2.5 10*3/uL (ref 0.7–4.0)
MCH: 31.3 pg (ref 26.0–34.0)
MCHC: 33.9 g/dL (ref 30.0–36.0)
MCV: 92.4 fL (ref 78.0–100.0)
MONO ABS: 0.3 10*3/uL (ref 0.1–1.0)
MONOS PCT: 5 % (ref 3–12)
Neutro Abs: 2.6 10*3/uL (ref 1.7–7.7)
Neutrophils Relative %: 46 % (ref 43–77)
Platelets: 191 10*3/uL (ref 150–400)
RBC: 4.63 MIL/uL (ref 4.22–5.81)
RDW: 13.9 % (ref 11.5–15.5)
WBC: 5.6 10*3/uL (ref 4.0–10.5)

## 2014-08-14 MED ORDER — SODIUM CHLORIDE 0.9 % IV BOLUS (SEPSIS): 1000.0000 mL | Freq: Once | INTRAVENOUS | Status: DC

## 2014-08-14 NOTE — ED Notes (Signed)
Pt called friend/neighbor for ride. Waiting for pick up in lobby. Discharge instructions given. Stated verbal understanding. All belongings with patient on discharge from ED.

## 2014-08-14 NOTE — ED Notes (Signed)
Pt was at home, has history of etoh abuse, apparently fell in his front yard, ems was called.  Pt states he has history of "passing out a lot"  From a previous brain injury.

## 2014-08-14 NOTE — Discharge Instructions (Signed)
Alcohol Intoxication °Alcohol intoxication occurs when the amount of alcohol that a person has consumed impairs his or her ability to mentally and physically function. Alcohol directly impairs the normal chemical activity of the brain. Drinking large amounts of alcohol can lead to changes in mental function and behavior, and it can cause many physical effects that can be harmful.  °Alcohol intoxication can range in severity from mild to very severe. Various factors can affect the level of intoxication that occurs, such as the person's age, gender, weight, frequency of alcohol consumption, and the presence of other medical conditions (such as diabetes, seizures, or heart conditions). Dangerous levels of alcohol intoxication may occur when people drink large amounts of alcohol in a short period (binge drinking). Alcohol can also be especially dangerous when combined with certain prescription medicines or "recreational" drugs. °SIGNS AND SYMPTOMS °Some common signs and symptoms of mild alcohol intoxication include: °· Loss of coordination. °· Changes in mood and behavior. °· Impaired judgment. °· Slurred speech. °As alcohol intoxication progresses to more severe levels, other signs and symptoms will appear. These may include: °· Vomiting. °· Confusion and impaired memory. °· Slowed breathing. °· Seizures. °· Loss of consciousness. °DIAGNOSIS  °Your health care provider will take a medical history and perform a physical exam. You will be asked about the amount and type of alcohol you have consumed. Blood tests will be done to measure the concentration of alcohol in your blood. In many places, your blood alcohol level must be lower than 80 mg/dL (0.08%) to legally drive. However, many dangerous effects of alcohol can occur at much lower levels.  °TREATMENT  °People with alcohol intoxication often do not require treatment. Most of the effects of alcohol intoxication are temporary, and they go away as the alcohol naturally  leaves the body. Your health care provider will monitor your condition until you are stable enough to go home. Fluids are sometimes given through an IV access tube to help prevent dehydration.  °HOME CARE INSTRUCTIONS °· Do not drive after drinking alcohol. °· Stay hydrated. Drink enough water and fluids to keep your urine clear or pale yellow. Avoid caffeine.   °· Only take over-the-counter or prescription medicines as directed by your health care provider.   °SEEK MEDICAL CARE IF:  °· You have persistent vomiting.   °· You do not feel better after a few days. °· You have frequent alcohol intoxication. Your health care provider can help determine if you should see a substance use treatment counselor. °SEEK IMMEDIATE MEDICAL CARE IF:  °· You become shaky or tremble when you try to stop drinking.   °· You shake uncontrollably (seizure).   °· You throw up (vomit) blood. This may be bright red or may look like black coffee grounds.   °· You have blood in your stool. This may be bright red or may appear as a black, tarry, bad smelling stool.   °· You become lightheaded or faint.   °MAKE SURE YOU:  °· Understand these instructions. °· Will watch your condition. °· Will get help right away if you are not doing well or get worse. °Document Released: 11/02/2004 Document Revised: 09/25/2012 Document Reviewed: 06/28/2012 °[employer]® Patient Information ©2015 [employer]. This information is not intended to replace advice given to you by your health care provider. Make sure you discuss any questions you have with your health care provider. ° °Chest Contusion °A chest contusion is a deep bruise on your chest area. Contusions are the result of an injury that caused bleeding under the skin.   A chest contusion may involve bruising of the skin, muscles, or ribs. The contusion may turn blue, purple, or yellow. Minor injuries will give you a painless contusion, but more severe contusions may stay painful and swollen for a few  weeks. °CAUSES  °A contusion is usually caused by a blow, trauma, or direct force to an area of the body. °SYMPTOMS  °· Swelling and redness of the injured area. °· Discoloration of the injured area. °· Tenderness and soreness of the injured area. °· Pain. °DIAGNOSIS  °The diagnosis can be made by taking a history and performing a physical exam. An X-Lexey Fletes, CT scan, or MRI may be needed to determine if there were any associated injuries, such as broken bones (fractures) or internal injuries. °TREATMENT  °Often, the best treatment for a chest contusion is resting, icing, and applying cold compresses to the injured area. Deep breathing exercises may be recommended to reduce the risk of pneumonia. Over-the-counter medicines may also be recommended for pain control. °HOME CARE INSTRUCTIONS  °· Put ice on the injured area. °¨ Put ice in a plastic bag. °¨ Place a towel between your skin and the bag. °¨ Leave the ice on for 15-20 minutes, 03-04 times a day. °· Only take over-the-counter or prescription medicines as directed by your caregiver. Your caregiver may recommend avoiding anti-inflammatory medicines (aspirin, ibuprofen, and naproxen) for 48 hours because these medicines may increase bruising. °· Rest the injured area. °· Perform deep-breathing exercises as directed by your caregiver. °· Stop smoking if you smoke. °· Do not lift objects over 5 pounds (2.3 kg) for 3 days or longer if recommended by your caregiver. °SEEK IMMEDIATE MEDICAL CARE IF:  °· You have increased bruising or swelling. °· You have pain that is getting worse. °· You have difficulty breathing. °· You have dizziness, weakness, or fainting. °· You have blood in your urine or stool. °· You cough up or vomit blood. °· Your swelling or pain is not relieved with medicines. °MAKE SURE YOU:  °· Understand these instructions. °· Will watch your condition. °· Will get help right away if you are not doing well or get worse. °Document Released: 10/18/2000  Document Revised: 10/18/2011 Document Reviewed: 07/17/2011 °[employer]® Patient Information ©2015 [employer]. This information is not intended to replace advice given to you by your health care provider. Make sure you discuss any questions you have with your health care provider. ° °

## 2014-08-14 NOTE — ED Provider Notes (Signed)
CSN: [id number]     Arrival date & time 08/14/14  2043 History  This chart was scribed for Pattricia Boss, MD by Randa Evens, ED Scribe. This patient was seen in room APA06/APA06 and the patient's care was started at 8:45 PM.    Chief Complaint  Patient presents with  . Fall  . Alcohol Intoxication   Patient is a 59 y.o. male presenting with fall and intoxication. The history is provided by the patient. No language interpreter was used.  Fall Associated symptoms include headaches. Pertinent negatives include no chest pain.  Alcohol Intoxication Associated symptoms include headaches. Pertinent negatives include no chest pain.   HPI Comments: Erik [name] is a 59 y.o. male who presents to the Emergency Department complaining of fall onset PTA. Per EMS pt fell while in his front yard. Pt is complaining of HA. Per EMS he has has left shoulder pain with associated swelling. Pt states that he lost consciousness causing him to fall. Pt admits to ETOH use today. Pt states that he has only had 3 beers today. Pt reports previous head injury that causes him to "pass out a lot." Doesn't report any medications PTA. Denies nausea, vomiting, fever chills, CP or cough.     Past Medical History  Diagnosis Date  . Head trauma 2001    closed head injury; coma for 4 weeks  . Alcohol abuse   . Depression   . Abnormal stress echocardiogram   . Hypercholesterolemia   . Hypertension    Past Surgical History  Procedure Laterality Date  . Kidney surgery      >30 years ago  . Hernia repair  2012  . Cardiac cath    . Brain surgery  2001  . Head injury surgery     Family History  Problem Relation Age of Onset  . Coronary artery disease Other   . Ataxia Neg Hx   . Chorea Neg Hx   . Dementia Neg Hx   . Mental retardation Neg Hx   . Migraines Neg Hx   . Multiple sclerosis Neg Hx   . Neurofibromatosis Neg Hx   . Neuropathy Neg Hx   . Parkinsonism Neg Hx   . Seizures Neg Hx   . Stroke Neg Hx     History  Substance Use Topics  . Smoking status: Current Every Day Smoker -- 0.30 packs/day    Types: Cigarettes  . Smokeless tobacco: Never Used  . Alcohol Use: 0.0 oz/week    0 Standard drinks or equivalent per week     Comment: beer occ    Review of Systems  Constitutional: Negative for fever and chills.  Respiratory: Negative for cough.   Cardiovascular: Negative for chest pain.  Gastrointestinal: Positive for nausea. Negative for vomiting.  Musculoskeletal: Positive for arthralgias.  Neurological: Positive for headaches.  All other systems reviewed and are negative.     Allergies  Review of patient's allergies indicates no known allergies.  Home Medications   Prior to Admission medications   Medication Sig Start Date End Date Taking? Authorizing Provider  albuterol (PROVENTIL HFA;VENTOLIN HFA) 108 (90 BASE) MCG/ACT inhaler Inhale 2 puffs into the lungs every 4 (four) hours as needed for wheezing or shortness of breath. 07/11/13   Rolland Porter, MD  aspirin 81 MG tablet Take 81 mg by mouth daily.    Historical Provider, MD  CRESTOR 40 MG tablet Take 40 mg by mouth at bedtime. 05/07/14   Historical Provider, MD  FLUoxetine (PROZAC) 20  MG capsule Take 20 mg by mouth daily.    Historical Provider, MD  magnesium oxide (MAG-OX) 400 MG tablet Take 400 mg by mouth daily.    Historical Provider, MD  omeprazole (PRILOSEC) 20 MG capsule  05/11/14   Historical Provider, MD  VIAGRA 100 MG tablet  12/24/13   Historical Provider, MD   There were no vitals taken for this visit.   Physical Exam  Constitutional: He is oriented to person, place, and time. He appears well-developed and well-nourished. No distress.  HENT:  Head: Normocephalic and atraumatic.  Right Ear: External ear normal.  Left Ear: External ear normal.  Nose: Nose normal.  Mouth/Throat: Oropharynx is clear and moist.  Eyes: Conjunctivae and EOM are normal. Pupils are equal, round, and reactive to light.  Neck: Normal  range of motion. Neck supple. No tracheal deviation present.  Cardiovascular: Normal rate and regular rhythm.   Pulmonary/Chest: Effort normal and breath sounds normal. No respiratory distress. He exhibits tenderness.  ttp left medial clavicle  Abdominal: Soft. Bowel sounds are normal.  Musculoskeletal: Normal range of motion.  Neurological: He is alert and oriented to person, place, and time.  Skin: Skin is warm and dry.  Psychiatric: He has a normal mood and affect. His behavior is normal.  Nursing note and vitals reviewed.   ED Course  Procedures (including critical care time) DIAGNOSTIC STUDIES: Oxygen Saturation is % on92% adequate, lby my interpretation.    COORDINATION OF CARE: 8:51 PM-Discussed treatment plan with pt at bedside and pt agreed to plan.     Labs Review Labs Reviewed  COMPREHENSIVE METABOLIC PANEL - Abnormal; Notable for the following:    Calcium 7.9 (*)    ALT 13 (*)    All other components within normal limits  CBC WITH DIFFERENTIAL/PLATELET  URINE RAPID DRUG SCREEN, HOSP PERFORMED    Imaging Review Ct Head Wo Contrast  08/14/2014   CLINICAL DATA:  Altered mental status. Fall. Left-sided headache and shoulder pain. Initial encounter.  EXAM: CT HEAD WITHOUT CONTRAST  CT CERVICAL SPINE WITHOUT CONTRAST  TECHNIQUE: Multidetector CT imaging of the head and cervical spine was performed following the standard protocol without intravenous contrast. Multiplanar CT image reconstructions of the cervical spine were also generated.  COMPARISON:  09/26/2012  FINDINGS: CT HEAD FINDINGS  Skull and Sinuses:No acute fracture.  A unusual appearing foreign body in the right frontal scalp is stable from 2014. Chronic mastoiditis in the bilateral mastoid tips.  Orbits: No acute abnormality.  Brain: No evidence of acute infarction, hemorrhage, hydrocephalus, or mass lesion/mass effect. Encephalomalacia and gliosis in the inferior right frontal and temporal lobes is stable. This is a  posttraumatic pattern. There is age advanced atrophy of the cerebrum and cerebellum.  CT CERVICAL SPINE FINDINGS  Negative for acute fracture or subluxation. No prevertebral edema. No gross cervical canal hematoma.  Prominent for age degenerative disc narrowing from C3-4 to C6-7 with bilateral foraminal stenosis at these levels, greatest at C3-4.  Biapical centrilobular emphysema.  IMPRESSION: 1. No evidence of acute intracranial or cervical spine injury. 2. Gliosis in the inferior right frontal and temporal lobes, likely posttraumatic. 3. Generalized brain atrophy.   Electronically Signed   By: Monte Fantasia M.D.   On: 08/14/2014 22:30   Ct Cervical Spine Wo Contrast  08/14/2014   CLINICAL DATA:  Altered mental status. Fall. Left-sided headache and shoulder pain. Initial encounter.  EXAM: CT HEAD WITHOUT CONTRAST  CT CERVICAL SPINE WITHOUT CONTRAST  TECHNIQUE: Multidetector CT imaging  of the head and cervical spine was performed following the standard protocol without intravenous contrast. Multiplanar CT image reconstructions of the cervical spine were also generated.  COMPARISON:  09/26/2012  FINDINGS: CT HEAD FINDINGS  Skull and Sinuses:No acute fracture.  A unusual appearing foreign body in the right frontal scalp is stable from 2014. Chronic mastoiditis in the bilateral mastoid tips.  Orbits: No acute abnormality.  Brain: No evidence of acute infarction, hemorrhage, hydrocephalus, or mass lesion/mass effect. Encephalomalacia and gliosis in the inferior right frontal and temporal lobes is stable. This is a posttraumatic pattern. There is age advanced atrophy of the cerebrum and cerebellum.  CT CERVICAL SPINE FINDINGS  Negative for acute fracture or subluxation. No prevertebral edema. No gross cervical canal hematoma.  Prominent for age degenerative disc narrowing from C3-4 to C6-7 with bilateral foraminal stenosis at these levels, greatest at C3-4.  Biapical centrilobular emphysema.  IMPRESSION: 1. No  evidence of acute intracranial or cervical spine injury. 2. Gliosis in the inferior right frontal and temporal lobes, likely posttraumatic. 3. Generalized brain atrophy.   Electronically Signed   By: Monte Fantasia M.D.   On: 08/14/2014 22:30     EKG Interpretation   Date/Time:  Friday August 14 2014 20:48:30 EDT Ventricular Rate:  73 PR Interval:  189 QRS Duration: 110 QT Interval:  403 QTC Calculation: 444 R Axis:   -75 Text Interpretation:  Sinus rhythm Atrial premature complex Left anterior  fascicular block Borderline low voltage, extremity leads No significant  change since last tracing July 11, 2013 Confirmed by Xzavior Reinig MD, Andee Poles  [phone]) on 08/14/2014 10:40:20 PM      MDM   Final diagnoses:  Fall  Alcohol intoxication, uncomplicated   I personally performed the services described in this documentation, which was scribed in my presence. The recorded information has been reviewed and considered.      Pattricia Boss, MD 08/14/14 2300

## 2014-08-25 ENCOUNTER — Other Ambulatory Visit: Payer: Self-pay | Admitting: Neurology

## 2014-09-04 ENCOUNTER — Ambulatory Visit: Payer: Self-pay | Admitting: Neurology

## 2014-09-16 ENCOUNTER — Ambulatory Visit (INDEPENDENT_AMBULATORY_CARE_PROVIDER_SITE_OTHER): Payer: Self-pay | Admitting: Neurology

## 2014-09-16 ENCOUNTER — Encounter: Payer: Self-pay | Admitting: Neurology

## 2014-09-16 VITALS — BP 146/82 | HR 72 | Resp 16 | Ht 74.0 in | Wt 221.3 lb

## 2014-09-16 DIAGNOSIS — F17200 Nicotine dependence, unspecified, uncomplicated: Secondary | ICD-10-CM

## 2014-09-16 DIAGNOSIS — S065X9A Traumatic subdural hemorrhage with loss of consciousness of unspecified duration, initial encounter: Secondary | ICD-10-CM

## 2014-09-16 DIAGNOSIS — G44219 Episodic tension-type headache, not intractable: Secondary | ICD-10-CM

## 2014-09-16 DIAGNOSIS — R55 Syncope and collapse: Secondary | ICD-10-CM

## 2014-09-16 DIAGNOSIS — I6200 Nontraumatic subdural hemorrhage, unspecified: Secondary | ICD-10-CM

## 2014-09-16 DIAGNOSIS — S065XAA Traumatic subdural hemorrhage with loss of consciousness status unknown, initial encounter: Secondary | ICD-10-CM

## 2014-09-16 DIAGNOSIS — Z720 Tobacco use: Secondary | ICD-10-CM

## 2014-09-16 DIAGNOSIS — F1010 Alcohol abuse, uncomplicated: Secondary | ICD-10-CM

## 2014-09-16 DIAGNOSIS — F1011 Alcohol abuse, in remission: Secondary | ICD-10-CM | POA: Insufficient documentation

## 2014-09-16 NOTE — Patient Instructions (Addendum)
Continue amitriptyline 25mg  at bedtime.   Caution getting up Try quitting smoking Follow up in 6 months.

## 2014-09-16 NOTE — Progress Notes (Signed)
NEUROLOGY FOLLOW UP OFFICE NOTE  Erik [name] [id number]  HISTORY OF PRESENT ILLNESS: Erik [name] is a 59 year old man with history of HTN, hyperlipidemia, PTSD, alcohol abuse, depression and who presents for follow-up regarding tension type headache, syncope, gait unsteadiness and history of SDH.   ED notes and images of CT of head and cervical spine from July reviewed.  UPDATE: He still drinks and was seen in the ED on 08/14/14 for another syncopal episode and fall.  CT of head showed chronic gliosis in the right frontal and temporal region but no acute bleed.  CT cervical spine was okay.  He had increased headache afterwards.  He restarted amitriptyline 25mg  recently.  He notes frequent photosensitivity.  HISTORY: In 2014, he was on the porch with a friend.  He stood up and felt lightheaded, and then passed out.  When he woke up, the paramedics were there.  He did not have any convulsions, tongue laceration or incontinence.  He elected not to go to the hospital.  Later that night, he began to feel shaky with cold sweat.  He presented a couple of days later to an outside ED, where CT of brain revealed small amount of subarachnoid blood at the high left parietal lobe, as well as small 3 mm adjacent subdural hematoma without mass effect.  There was also a non-displaced left parietal skull fracture.  He was subsequently transferred to [facility].  Serum etoh level was 217.  He was evaluated by neurosurgery.  Follow up CT scan was stable, so no surgical intervention was indicated.  EEG from 08/27/12 was normal.  2D Echo revealed EF 55-60% with no significant abnormalities.  Black outs were thought to be secondary to postural hypotension, rather than seizure.    He has periodic dizzy spells of lightheadedness and feeling of hot flashes or chills.  Sometimes they are associated with passing out for a few seconds.  He has no convulsions, tongue biting, urinary incontinence or postictal confusion.  It  usually occurs soon after getting up.  He has fluctuation in blood pressure.  At one point, he was orthostatic but he sometimes has elevated blood pressure.  He had a cardiac monitor which was unremarkable.    He has history of alcohol abuse but only moderately drinks from time to time.   Due to orthostatic hypotension, he was taken off of metoprolol.   For headaches, he was taking amitriptyline, which helped but his insurance wouldn't cover it.  PAST MEDICAL HISTORY: Past Medical History  Diagnosis Date  . Head trauma 2001    closed head injury; coma for 4 weeks  . Alcohol abuse   . Depression   . Abnormal stress echocardiogram   . Hypercholesterolemia   . Hypertension     MEDICATIONS: Current Outpatient Prescriptions on File Prior to Visit  Medication Sig Dispense Refill  . albuterol (PROVENTIL HFA;VENTOLIN HFA) 108 (90 BASE) MCG/ACT inhaler Inhale 2 puffs into the lungs every 4 (four) hours as needed for wheezing or shortness of breath. 6.7 g 0  . amitriptyline (ELAVIL) 25 MG tablet TAKE ONE TABLET BY MOUTH AT BEDTIME. 30 tablet 2  . aspirin 81 MG tablet Take 81 mg by mouth daily.    . CRESTOR 40 MG tablet Take 40 mg by mouth at bedtime.  2  . FLUoxetine (PROZAC) 20 MG capsule Take 20 mg by mouth daily.    Marland Kitchen omeprazole (PRILOSEC) 20 MG capsule Take 20 mg by mouth daily.  3  . VIAGRA 100 MG tablet Take 100 mg by mouth as needed for erectile dysfunction.   11  . [DISCONTINUED] Dexlansoprazole (DEXILANT) 30 MG capsule Take 30 mg by mouth daily.       No current facility-administered medications on file prior to visit.    ALLERGIES: No Known Allergies  FAMILY HISTORY: Family History  Problem Relation Age of Onset  . Coronary artery disease Other   . Ataxia Neg Hx   . Chorea Neg Hx   . Dementia Neg Hx   . Mental retardation Neg Hx   . Migraines Neg Hx   . Multiple sclerosis Neg Hx   . Neurofibromatosis Neg Hx   . Neuropathy Neg Hx   . Parkinsonism Neg Hx   . Seizures  Neg Hx   . Stroke Neg Hx     SOCIAL HISTORY: Social History   Social History  . Marital Status: Divorced    Spouse Name: N/A  . Number of Children: N/A  . Years of Education: N/A   Occupational History  . Full time    Social History Main Topics  . Smoking status: Current Every Day Smoker -- 0.30 packs/day    Types: Cigarettes  . Smokeless tobacco: Never Used     Comment: patient is aware he needs to quit  . Alcohol Use: 0.0 oz/week    0 Standard drinks or equivalent per week     Comment: beer occ  . Drug Use: Yes    Special: Marijuana     Comment: marijuana last 2012  . Sexual Activity:    Partners: Female   Other Topics Concern  . Not on file   Social History Narrative   Single    REVIEW OF SYSTEMS: Constitutional: No fevers, chills, or sweats, no generalized fatigue, change in appetite Eyes: No visual changes, double vision, eye pain Ear, nose and throat: No hearing loss, ear pain, nasal congestion, sore throat Cardiovascular: No chest pain, palpitations Respiratory:  No shortness of breath at rest or with exertion, wheezes GastrointestinaI: No nausea, vomiting, diarrhea, abdominal pain, fecal incontinence Genitourinary:  No dysuria, urinary retention or frequency Musculoskeletal:  No neck pain, back pain Integumentary: No rash, pruritus, skin lesions Neurological: as above Psychiatric: No depression, insomnia, anxiety Endocrine: No palpitations, fatigue, diaphoresis, mood swings, change in appetite, change in weight, increased thirst Hematologic/Lymphatic:  No anemia, purpura, petechiae. Allergic/Immunologic: no itchy/runny eyes, nasal congestion, recent allergic reactions, rashes  PHYSICAL EXAM: Filed Vitals:   09/16/14 0927  BP: 146/82  Pulse: 72  Resp: 16   General: No acute distress.  Patient appears well-groomed.   Head:  Normocephalic/atraumatic Eyes:  Fundoscopic exam unremarkable without vessel changes, exudates, hemorrhages or  papilledema. Neck: supple, no paraspinal tenderness, full range of motion Heart:  Regular rate and rhythm Lungs:  Clear to auscultation bilaterally Back: No paraspinal tenderness Neurological Exam: alert and oriented to person, place, and time. Attention span and concentration intact, recent and remote memory intact, fund of knowledge intact.  Speech fluent and not dysarthric, language intact.  CN II-XII intact. Fundi not visualized.  Bulk and tone normal, muscle strength 5/5 throughout.  Deep tendon reflexes 2+ throughout, toes downgoing.  Reduced temperature and vibration sensation in toes.  Finger to nose with some mild intention tremor, heel to shin without dysmetria.  Gait mildly unsteady. Unable to tandem walk.  Romberg with sway.  IMPRESSION: Tension-type headaches, improved Dizziness and syncope.  Possibly related to a dysautonomia secondary to head trauma or history  of alcoholism History of traumatic SDH History of alcohol abuse Tobacco user  PLAN: Continue amitriptyline 25mg  at bedtime Caution standing up Use cane Smoking cessation Should have BP rechecked with primary care physician or cardiologist Follow up in 6 months.  Metta Clines, DO

## 2014-11-24 ENCOUNTER — Encounter: Payer: Self-pay | Admitting: Gastroenterology

## 2014-12-08 ENCOUNTER — Other Ambulatory Visit: Payer: Self-pay | Admitting: Physician Assistant

## 2014-12-08 MED ORDER — OMEPRAZOLE 40 MG PO CPDR: 40.0000 mg | DELAYED_RELEASE_CAPSULE | Freq: Two times a day (BID) | ORAL | Status: DC

## 2014-12-08 MED ORDER — ALBUTEROL SULFATE HFA 108 (90 BASE) MCG/ACT IN AERS: 2.0000 | INHALATION_SPRAY | Freq: Four times a day (QID) | RESPIRATORY_TRACT | Status: DC | PRN

## 2014-12-08 MED ORDER — SILDENAFIL CITRATE 100 MG PO TABS: 50.0000 mg | ORAL_TABLET | Freq: Every day | ORAL | Status: DC | PRN

## 2014-12-08 MED ORDER — ROSUVASTATIN CALCIUM 40 MG PO TABS: 40.0000 mg | ORAL_TABLET | Freq: Every day | ORAL | Status: DC

## 2014-12-15 ENCOUNTER — Ambulatory Visit (INDEPENDENT_AMBULATORY_CARE_PROVIDER_SITE_OTHER): Payer: Self-pay | Admitting: Gastroenterology

## 2014-12-15 ENCOUNTER — Encounter: Payer: Self-pay | Admitting: Gastroenterology

## 2014-12-15 ENCOUNTER — Other Ambulatory Visit: Payer: Self-pay

## 2014-12-15 VITALS — BP 127/83 | HR 91 | Temp 98.7°F | Ht 74.0 in | Wt 224.4 lb

## 2014-12-15 DIAGNOSIS — Z86010 Personal history of colonic polyps: Secondary | ICD-10-CM

## 2014-12-15 DIAGNOSIS — K219 Gastro-esophageal reflux disease without esophagitis: Secondary | ICD-10-CM

## 2014-12-15 NOTE — Patient Instructions (Addendum)
Start taking Dexilant once each morning. Stop Prilosec. I have provided samples. Let me know how this works for you!  We have scheduled you for a colonoscopy and upper endoscopy with Dr. Oneida Alar in the near future.   Please review the reflux handout attached.    Gastroesophageal Reflux Disease, Adult Normally, food travels down the esophagus and stays in the stomach to be digested. However, when a person has gastroesophageal reflux disease (GERD), food and stomach acid move back up into the esophagus. When this happens, the esophagus becomes sore and inflamed. Over time, GERD can create small holes (ulcers) in the lining of the esophagus.  CAUSES This condition is caused by a problem with the muscle between the esophagus and the stomach (lower esophageal sphincter, or LES). Normally, the LES muscle closes after food passes through the esophagus to the stomach. When the LES is weakened or abnormal, it does not close properly, and that allows food and stomach acid to go back up into the esophagus. The LES can be weakened by certain dietary substances, medicines, and medical conditions, including:  Tobacco use.  Pregnancy.  Having a hiatal hernia.  Heavy alcohol use.  Certain foods and beverages, such as coffee, chocolate, onions, and peppermint. RISK FACTORS This condition is more likely to develop in:  People who have an increased body weight.  People who have connective tissue disorders.  People who use NSAID medicines. SYMPTOMS Symptoms of this condition include:  Heartburn.  Difficult or painful swallowing.  The feeling of having a lump in the throat.  Abitter taste in the mouth.  Bad breath.  Having a large amount of saliva.  Having an upset or bloated stomach.  Belching.  Chest pain.  Shortness of breath or wheezing.  Ongoing (chronic) cough or a night-time cough.  Wearing away of tooth enamel.  Weight loss. Different conditions can cause chest pain. Make  sure to see your health care provider if you experience chest pain. DIAGNOSIS Your health care provider will take a medical history and perform a physical exam. To determine if you have mild or severe GERD, your health care provider may also monitor how you respond to treatment. You may also have other tests, including:  An endoscopy toexamine your stomach and esophagus with a small camera.  A test thatmeasures the acidity level in your esophagus.  A test thatmeasures how much pressure is on your esophagus.  A barium swallow or modified barium swallow to show the shape, size, and functioning of your esophagus. TREATMENT The goal of treatment is to help relieve your symptoms and to prevent complications. Treatment for this condition may vary depending on how severe your symptoms are. Your health care provider may recommend:  Changes to your diet.  Medicine.  Surgery. HOME CARE INSTRUCTIONS Diet  Follow a diet as recommended by your health care provider. This may involve avoiding foods and drinks such as:  Coffee and tea (with or without caffeine).  Drinks that containalcohol.  Energy drinks and sports drinks.  Carbonated drinks or sodas.  Chocolate and cocoa.  Peppermint and mint flavorings.  Garlic and onions.  Horseradish.  Spicy and acidic foods, including peppers, chili powder, curry powder, vinegar, hot sauces, and barbecue sauce.  Citrus fruit juices and citrus fruits, such as oranges, lemons, and limes.  Tomato-based foods, such as red sauce, chili, salsa, and pizza with red sauce.  Fried and fatty foods, such as donuts, french fries, potato chips, and high-fat dressings.  High-fat meats, such as  hot dogs and fatty cuts of red and white meats, such as rib eye steak, sausage, ham, and bacon.  High-fat dairy items, such as whole milk, butter, and cream cheese.  Eat small, frequent meals instead of large meals.  Avoid drinking large amounts of liquid with  your meals.  Avoid eating meals during the 2-3 hours before bedtime.  Avoid lying down right after you eat.  Do not exercise right after you eat. General Instructions  Pay attention to any changes in your symptoms.  Take over-the-counter and prescription medicines only as told by your health care provider. Do not take aspirin, ibuprofen, or other NSAIDs unless your health care provider told you to do so.  Do not use any tobacco products, including cigarettes, chewing tobacco, and e-cigarettes. If you need help quitting, ask your health care provider.  Wear loose-fitting clothing. Do not wear anything tight around your waist that causes pressure on your abdomen.  Raise (elevate) the head of your bed 6 inches (15cm).  Try to reduce your stress, such as with yoga or meditation. If you need help reducing stress, ask your health care provider.  If you are overweight, reduce your weight to an amount that is healthy for you. Ask your health care provider for guidance about a safe weight loss goal.  Keep all follow-up visits as told by your health care provider. This is important. SEEK MEDICAL CARE IF:  You have new symptoms.  You have unexplained weight loss.  You have difficulty swallowing, or it hurts to swallow.  You have wheezing or a persistent cough.  Your symptoms do not improve with treatment.  You have a hoarse voice. SEEK IMMEDIATE MEDICAL CARE IF:  You have pain in your arms, neck, jaw, teeth, or back.  You feel sweaty, dizzy, or light-headed.  You have chest pain or shortness of breath.  You vomit and your vomit looks like blood or coffee grounds.  You faint.  Your stool is bloody or black.  You cannot swallow, drink, or eat.   This information is not intended to replace advice given to you by your health care provider. Make sure you discuss any questions you have with your health care provider.   Document Released: 11/02/2004 Document Revised: 10/14/2014  Document Reviewed: 05/20/2014 Elsevier Interactive Patient Education [employer].

## 2014-12-15 NOTE — Progress Notes (Signed)
Primary Care Physician:  [name], PA-C Primary Gastroenterologist:  Dr. Oneida Alar   Chief Complaint  Patient presents with  . Gastroesophageal Reflux  . Colonoscopy    HPI:   Erik [name] is a 59 y.o. male presenting today at the request of his PCP secondary to GERD and need for surveillance colonoscopy due to history of multiple polyps in 2011. He was actually due for surveillance in 2014.   Thinks he may have had an EGD in the past. Has been on Nexium for about 4 years, taken off of this and placed on Prilosec. Prilosec not helping and taking multiple times a day. Worse at night. Wakes up early in the morning gagging. Coughs up clear phlegm. No dysphagia. Nausea in the mornings when waking with phlegm. No abdominal pain. No unexplained weight loss or lack of appetite. No NSAIDs. Takes 81 mg ASA daily.    Due for colonoscopy now due to history of multiple adenomas and hyperplastic polyps. No hematochezia. Feels like stool is a little more frequent, softer now. No diarrhea.   Past Medical History  Diagnosis Date  . Head trauma 2001    closed head injury; coma for 4 weeks  . Alcohol abuse   . Depression   . Abnormal stress echocardiogram   . Hypercholesterolemia   . Hypertension     Past Surgical History  Procedure Laterality Date  . Kidney surgery      >30 years ago  . Hernia repair  2012  . Cardiac cath    . Craniotomy  2001  . Head injury surgery    . Colonoscopy  2011    Dr. Oneida Alar: multiple adenomas and hyperplastic polyps    Current Outpatient Prescriptions  Medication Sig Dispense Refill  . albuterol (PROVENTIL HFA;VENTOLIN HFA) 108 (90 BASE) MCG/ACT inhaler Inhale 2 puffs into the lungs every 6 (six) hours as needed for wheezing or shortness of breath. 1 Inhaler 1  . amitriptyline (ELAVIL) 25 MG tablet TAKE ONE TABLET BY MOUTH AT BEDTIME. (Patient taking differently: TAKE ONE TABLET BY MOUTH AT BEDTIME. prn) 30 tablet 2  . aspirin 81 MG tablet Take  81 mg by mouth daily.    Marland Kitchen FLUoxetine (PROZAC) 20 MG capsule Take 20 mg by mouth daily.    . rosuvastatin (CRESTOR) 40 MG tablet Take 1 tablet (40 mg total) by mouth daily. 90 tablet 1  . sildenafil (VIAGRA) 100 MG tablet Take 0.5-1 tablets (50-100 mg total) by mouth daily as needed for erectile dysfunction. 10 tablet 1  . dexlansoprazole (DEXILANT) 60 MG capsule Take 60 mg by mouth daily.     No current facility-administered medications for this visit.    Allergies as of 12/15/2014  . (No Known Allergies)    Family History  Problem Relation Age of Onset  . Coronary artery disease Other   . Ataxia Neg Hx   . Chorea Neg Hx   . Dementia Neg Hx   . Mental retardation Neg Hx   . Migraines Neg Hx   . Multiple sclerosis Neg Hx   . Neurofibromatosis Neg Hx   . Neuropathy Neg Hx   . Parkinsonism Neg Hx   . Seizures Neg Hx   . Stroke Neg Hx   . Colon cancer Neg Hx     Social History   Social History  . Marital Status: Divorced    Spouse Name: N/A  . Number of Children: N/A  . Years of Education: N/A  Occupational History  . Full time    Social History Main Topics  . Smoking status: Current Every Day Smoker -- 0.25 packs/day    Types: Cigarettes  . Smokeless tobacco: Never Used     Comment: patient is aware he needs to quit  . Alcohol Use: 0.0 oz/week    0 Standard drinks or equivalent per week     Comment: beer occ, history of ETOH abuse in remote past.   . Drug Use: Yes    Special: Marijuana     Comment: marijuana last 2012  . Sexual Activity:    Partners: Female   Other Topics Concern  . Not on file   Social History Narrative   Single    Review of Systems: Gen: Denies any fever, chills, fatigue, weight loss, lack of appetite.  CV: occasional palpitations Resp: +wheezing  GI: see HPI  GU : Denies urinary burning, urinary frequency, urinary hesitancy MS: unsteady gait at times, history of head injury, always feels off balance. Has cane for balance  Derm:  Denies rash, itching, dry skin Psych: Denies depression, anxiety, memory loss, and confusion Heme: Denies bruising, bleeding, and enlarged lymph nodes.  Physical Exam: BP 127/83 mmHg  Pulse 91  Temp(Src) 98.7 F (37.1 C) (Oral)  Ht 6\' 2"  (1.88 m)  Wt 224 lb 6.4 oz (101.787 kg)  BMI 28.80 kg/m2 General:   Alert and oriented. Pleasant and cooperative. Well-nourished and well-developed.  Head:  Normocephalic and atraumatic. Eyes:  Without icterus, sclera clear and conjunctiva pink.  Ears:  Normal auditory acuity. Nose:  No deformity, discharge,  or lesions. Mouth:  No deformity or lesions, oral mucosa pink.  Lungs:  Clear to auscultation bilaterally. No wheezes, rales, or rhonchi. No distress.  Heart:  S1, S2 present without murmurs appreciated.  Abdomen:  +BS, soft, non-tender and non-distended. No HSM noted. No guarding or rebound. No masses appreciated.  Rectal:  Deferred  Msk:  Symmetrical without gross deformities. Normal posture. Extremities:  Without edema. Neurologic:  Alert and  oriented x4;  grossly normal neurologically. Psych:  Alert and cooperative. Normal mood and affect.

## 2014-12-16 ENCOUNTER — Ambulatory Visit: Payer: Self-pay | Admitting: Physician Assistant

## 2014-12-16 ENCOUNTER — Encounter: Payer: Self-pay | Admitting: Physician Assistant

## 2014-12-16 VITALS — BP 146/84 | HR 95 | Temp 97.7°F | Ht 74.0 in | Wt 226.0 lb

## 2014-12-16 DIAGNOSIS — R030 Elevated blood-pressure reading, without diagnosis of hypertension: Secondary | ICD-10-CM

## 2014-12-16 DIAGNOSIS — E785 Hyperlipidemia, unspecified: Secondary | ICD-10-CM

## 2014-12-16 DIAGNOSIS — F17210 Nicotine dependence, cigarettes, uncomplicated: Secondary | ICD-10-CM

## 2014-12-16 NOTE — Progress Notes (Signed)
BP 146/84 mmHg  Pulse 95  Temp(Src) 97.7 F (36.5 C)  Ht 6\' 2"  (1.88 m)  Wt 226 lb (102.513 kg)  BMI 29.00 kg/m2  SpO2 96%   Subjective:    Patient ID: Erik [name], male    DOB: 03-25-1955, 59 y.o.   MRN: [id number]  HPI: Erik [name] is a 59 y.o. male presenting on 12/16/2014 for   HPI Chief Complaint  Patient presents with  . Hyperlipidemia    Pt is still seeing GI for his reflux, cardiology, and neurology for dizziness. [facility] [place] [facility] Pt says he is still drinking sometimes  Relevant past medical, surgical, family and social history reviewed and updated as indicated. Interim medical history since our last visit reviewed. Allergies and medications reviewed and updated.  Current outpatient prescriptions:  .  albuterol (PROVENTIL HFA;VENTOLIN HFA) 108 (90 BASE) MCG/ACT inhaler, Inhale 2 puffs into the lungs every 6 (six) hours as needed for wheezing or shortness of breath., Disp: 1 Inhaler, Rfl: 1 .  amitriptyline (ELAVIL) 25 MG tablet, TAKE ONE TABLET BY MOUTH AT BEDTIME. (Patient taking differently: TAKE ONE TABLET BY MOUTH AT BEDTIME. prn), Disp: 30 tablet, Rfl: 2 .  aspirin 81 MG tablet, Take 81 mg by mouth daily., Disp: , Rfl:  .  dexlansoprazole (DEXILANT) 60 MG capsule, Take 60 mg by mouth daily., Disp: , Rfl:  .  FLUoxetine (PROZAC) 20 MG capsule, Take 20 mg by mouth daily., Disp: , Rfl:  .  rosuvastatin (CRESTOR) 40 MG tablet, Take 1 tablet (40 mg total) by mouth daily., Disp: 90 tablet, Rfl: 1 .  sildenafil (VIAGRA) 100 MG tablet, Take 0.5-1 tablets (50-100 mg total) by mouth daily as needed for erectile dysfunction., Disp: 10 tablet, Rfl: 1   Review of Systems  Constitutional: Negative for fever, chills, diaphoresis, appetite change, fatigue and unexpected weight change.  HENT: Positive for congestion, sneezing and trouble swallowing. Negative for dental problem, drooling, ear pain, facial swelling, hearing loss, mouth sores, sore  throat and voice change.   Eyes: Positive for visual disturbance. Negative for pain, discharge, redness and itching.  Respiratory: Positive for cough and choking. Negative for shortness of breath and wheezing.   Cardiovascular: Negative for chest pain, palpitations and leg swelling.  Gastrointestinal: Positive for vomiting and diarrhea. Negative for abdominal pain, constipation and blood in stool.  Endocrine: Negative for cold intolerance, heat intolerance and polydipsia.  Genitourinary: Negative for dysuria, hematuria and decreased urine volume.  Musculoskeletal: Positive for gait problem. Negative for back pain and arthralgias.  Skin: Negative for rash.  Allergic/Immunologic: Negative for environmental allergies.  Neurological: Positive for syncope and light-headedness. Negative for seizures and headaches.  Hematological: Negative for adenopathy.  Psychiatric/Behavioral: Positive for dysphoric mood and agitation. Negative for suicidal ideas. The patient is nervous/anxious.     Per HPI unless specifically indicated above     Objective:    BP 146/84 mmHg  Pulse 95  Temp(Src) 97.7 F (36.5 C)  Ht 6\' 2"  (1.88 m)  Wt 226 lb (102.513 kg)  BMI 29.00 kg/m2  SpO2 96%  Wt Readings from Last 3 Encounters:  12/16/14 226 lb (102.513 kg)  12/15/14 224 lb 6.4 oz (101.787 kg)  09/16/14 221 lb 4.8 oz (100.381 kg)    Physical Exam  Constitutional: He is oriented to person, place, and time. He appears well-developed and well-nourished.  HENT:  Head: Normocephalic and atraumatic.  Neck: Neck supple.  Cardiovascular: Normal rate and regular rhythm.  Pulmonary/Chest: Effort normal and breath sounds normal. He has no wheezes.  Abdominal: Soft. Bowel sounds are normal.  Lymphadenopathy:    He has no cervical adenopathy.  Neurological: He is alert and oriented to person, place, and time.  Skin: Skin is warm and dry.  Psychiatric: He has a normal mood and affect. His behavior is normal.   Vitals reviewed.       Assessment & Plan:   Encounter Diagnoses  Name Primary?  . Hyperlipemia Yes  . Elevated blood pressure reading without diagnosis of hypertension   . Cigarette nicotine dependence, uncomplicated     -BP up a bit today. It was good yesterday and at his last OV here.  Will not change meds today -Check fasting lipids- will call with results -Counseled again on smoking cessation -F/u 3 mo

## 2014-12-17 LAB — LIPID PANEL
CHOL/HDL RATIO: 2.1 ratio (ref <=5.0)
Cholesterol: 140 mg/dL (ref 125–200)
HDL: 66 mg/dL (ref >=40)
LDL CALC: 65 mg/dL (ref <130)
TRIGLYCERIDES: 46 mg/dL (ref <150)
VLDL: 9 mg/dL (ref <30)

## 2014-12-17 LAB — COMPLETE METABOLIC PANEL WITH GFR
ALT: 16 U/L (ref 9–46)
AST: 21 U/L (ref 10–35)
Albumin: 4.3 g/dL (ref 3.6–5.1)
Alkaline Phosphatase: 60 U/L (ref 40–115)
BILIRUBIN TOTAL: 0.5 mg/dL (ref 0.2–1.2)
BUN: 10 mg/dL (ref 7–25)
CHLORIDE: 98 mmol/L (ref 98–110)
CO2: 29 mmol/L (ref 20–31)
Calcium: 9.6 mg/dL (ref 8.6–10.3)
Creat: 0.86 mg/dL (ref 0.70–1.33)
Glucose, Bld: 113 mg/dL — ABNORMAL HIGH (ref 65–99)
POTASSIUM: 4.3 mmol/L (ref 3.5–5.3)
Sodium: 137 mmol/L (ref 135–146)
Total Protein: 7.3 g/dL (ref 6.1–8.1)

## 2014-12-18 ENCOUNTER — Encounter: Payer: Self-pay | Admitting: Gastroenterology

## 2014-12-18 NOTE — Assessment & Plan Note (Signed)
Refractory GERD despite PPI dosing once daily. Currently failed Prilosec, hast taken Nexium historically. Trial Dexilant. No dysphagia. Proceed with EGD for further evaluation.   Proceed with upper endoscopy in the near future with Dr. Oneida Alar. The risks, benefits, and alternatives have been discussed in detail with patient. They have stated understanding and desire to proceed.  Propofol due to polypharmacy and history of ETOH use in past

## 2014-12-18 NOTE — Assessment & Plan Note (Signed)
59 year old male with history of multiple polyps in 2011, overdue for surveillance now. No lower GI symptoms of concern.   Proceed with colonoscopy with Dr. Oneida Alar in the near future. The risks, benefits, and alternatives have been discussed in detail with the patient. They state understanding and desire to proceed.  PROPOFOL due to polypharmacy, history of ETOH use in past

## 2014-12-21 NOTE — Progress Notes (Signed)
cc'ed to pcp °

## 2015-01-05 NOTE — Patient Instructions (Signed)
DANIAL RUEFF  01/05/2015     @PREFPERIOPPHARMACY @   Your procedure is scheduled on  01/12/2015  Report to [facility] at  58  A.M.  Call this number if you have problems the morning of surgery:  [phone]   Remember:  Do not eat food or drink liquids after midnight.  Take these medicines the morning of surgery with A SIP OF WATER Dexilant, prozac. Take your inhaler before you come and bring it with you.   Do not wear jewelry, make-up or nail polish.  Do not wear lotions, powders, or perfumes.  You may wear deodorant.  Do not shave 48 hours prior to surgery.  Men may shave face and neck.  Do not bring valuables to the hospital.  [facility] is not responsible for any belongings or valuables.  Contacts, dentures or bridgework may not be worn into surgery.  Leave your suitcase in the car.  After surgery it may be brought to your room.  For patients admitted to the hospital, discharge time will be determined by your treatment team.  Patients discharged the day of surgery will not be allowed to drive home.   Name and phone number of your driver:   family Special instructions:  Follow the diet and prep instructions given to you by Dr Nona Dell office.  Please read over the following fact sheets that you were given. Pain Booklet, Coughing and Deep Breathing, Surgical Site Infection Prevention, Anesthesia Post-op Instructions and Care and Recovery After Surgery      Esophagogastroduodenoscopy Esophagogastroduodenoscopy (EGD) is a procedure that is used to examine the lining of the esophagus, stomach, and first part of the small intestine (duodenum). A long, flexible, lighted tube with a camera attached (endoscope) is inserted down the throat to view these organs. This procedure is done to detect problems or abnormalities, such as inflammation, bleeding, ulcers, or growths, in order to treat them. The procedure lasts 5-20 minutes. It is usually an outpatient procedure,  but it may need to be performed in a hospital in emergency cases. LET [facility] CARE PROVIDER KNOW ABOUT:  Any allergies you have.  All medicines you are taking, including vitamins, herbs, eye drops, creams, and over-the-counter medicines.  Previous problems you or members of your family have had with the use of anesthetics.  Any blood disorders you have.  Previous surgeries you have had.  Medical conditions you have. RISKS AND COMPLICATIONS Generally, this is a safe procedure. However, problems can occur and include:  Infection.  Bleeding.  Tearing (perforation) of the esophagus, stomach, or duodenum.  Difficulty breathing or not being able to breathe.  Excessive sweating.  Spasms of the larynx.  Slowed heartbeat.  Low blood pressure. BEFORE THE PROCEDURE  Do not eat or drink anything after midnight on the night before the procedure or as directed by your health care provider.  Do not take your regular medicines before the procedure if your health care provider asks you not to. Ask your health care provider about changing or stopping those medicines.  If you wear dentures, be prepared to remove them before the procedure.  Arrange for someone to drive you home after the procedure. PROCEDURE  A numbing medicine (local anesthetic) may be sprayed in your throat for comfort and to stop you from gagging or coughing.  You will have an IV tube inserted in a vein in your hand or arm. You will receive medicines and fluids through this tube.  You will be given a medicine to relax you (sedative).  A pain reliever will be given through the IV tube.  A mouth guard may be placed in your mouth to protect your teeth and to keep you from biting on the endoscope.  You will be asked to lie on your left side.  The endoscope will be inserted down your throat and into your esophagus, stomach, and duodenum.  Air will be put through the endoscope to allow your health care provider  to clearly view the lining of your esophagus.  The lining of your esophagus, stomach, and duodenum will be examined. During the exam, your health care provider may:  Remove tissue to be examined under a microscope (biopsy) for inflammation, infection, or other medical problems.  Remove growths.  Remove objects (foreign bodies) that are stuck.  Treat any bleeding with medicines or other devices that stop tissues from bleeding (hot cautery, clipping devices).  Widen (dilate) or stretch narrowed areas of your esophagus and stomach.  The endoscope will be withdrawn. AFTER THE PROCEDURE  You will be taken to a recovery area for observation. Your blood pressure, heart rate, breathing rate, and blood oxygen level will be monitored often until the medicines you were given have worn off.  Do not eat or drink anything until the numbing medicine has worn off and your gag reflex has returned. You may choke.  Your health care provider should be able to discuss his or her findings with you. It will take longer to discuss the test results if any biopsies were taken.   This information is not intended to replace advice given to you by your health care provider. Make sure you discuss any questions you have with your health care provider.   Document Released: 05/26/2004 Document Revised: 02/13/2014 Document Reviewed: 12/27/2011 Elsevier Interactive Patient Education 2016 [employer]. Colonoscopy A colonoscopy is an exam to look at the entire large intestine (colon). This exam can help find problems such as tumors, polyps, inflammation, and areas of bleeding. The exam takes about 1 hour.  LET [facility] CARE PROVIDER KNOW ABOUT:   Any allergies you have.  All medicines you are taking, including vitamins, herbs, eye drops, creams, and over-the-counter medicines.  Previous problems you or members of your family have had with the use of anesthetics.  Any blood disorders you have.  Previous  surgeries you have had.  Medical conditions you have. RISKS AND COMPLICATIONS  Generally, this is a safe procedure. However, as with any procedure, complications can occur. Possible complications include:  Bleeding.  Tearing or rupture of the colon wall.  Reaction to medicines given during the exam.  Infection (rare). BEFORE THE PROCEDURE   Ask your health care provider about changing or stopping your regular medicines.  You may be prescribed an oral bowel prep. This involves drinking a large amount of medicated liquid, starting the day before your procedure. The liquid will cause you to have multiple loose stools until your stool is almost clear or light green. This cleans out your colon in preparation for the procedure.  Do not eat or drink anything else once you have started the bowel prep, unless your health care provider tells you it is safe to do so.  Arrange for someone to drive you home after the procedure. PROCEDURE   You will be given medicine to help you relax (sedative).  You will lie on your side with your knees bent.  A long, flexible tube with a light and  camera on the end (colonoscope) will be inserted through the rectum and into the colon. The camera sends video back to a computer screen as it moves through the colon. The colonoscope also releases carbon dioxide gas to inflate the colon. This helps your health care provider see the area better.  During the exam, your health care provider may take a small tissue sample (biopsy) to be examined under a microscope if any abnormalities are found.  The exam is finished when the entire colon has been viewed. AFTER THE PROCEDURE   Do not drive for 24 hours after the exam.  You may have a small amount of blood in your stool.  You may pass moderate amounts of gas and have mild abdominal cramping or bloating. This is caused by the gas used to inflate your colon during the exam.  Ask when your test results will be ready  and how you will get your results. Make sure you get your test results.   This information is not intended to replace advice given to you by your health care provider. Make sure you discuss any questions you have with your health care provider.   Document Released: 01/21/2000 Document Revised: 11/13/2012 Document Reviewed: 09/30/2012 Elsevier Interactive Patient Education 2016 [employer] PATIENT INSTRUCTIONS POST-ANESTHESIA  IMMEDIATELY FOLLOWING SURGERY:  Do not drive or operate machinery for the first twenty four hours after surgery.  Do not make any important decisions for twenty four hours after surgery or while taking narcotic pain medications or sedatives.  If you develop intractable nausea and vomiting or a severe headache please notify your doctor immediately.  FOLLOW-UP:  Please make an appointment with your surgeon as instructed. You do not need to follow up with anesthesia unless specifically instructed to do so.  WOUND CARE INSTRUCTIONS (if applicable):  Keep a dry clean dressing on the anesthesia/puncture wound site if there is drainage.  Once the wound has quit draining you may leave it open to air.  Generally you should leave the bandage intact for twenty four hours unless there is drainage.  If the epidural site drains for more than 36-48 hours please call the anesthesia department.  QUESTIONS?:  Please feel free to call your physician or the hospital operator if you have any questions, and they will be happy to assist you.

## 2015-01-06 ENCOUNTER — Encounter (HOSPITAL_COMMUNITY)
Admission: RE | Admit: 2015-01-06 | Discharge: 2015-01-06 | Disposition: A | Discharge status: Home / Self Care | Payer: Medicaid Other | Source: Ambulatory Visit | Attending: Gastroenterology | Admitting: Gastroenterology

## 2015-01-06 ENCOUNTER — Encounter (HOSPITAL_COMMUNITY): Payer: Self-pay

## 2015-01-06 DIAGNOSIS — Z01818 Encounter for other preprocedural examination: Secondary | ICD-10-CM | POA: Insufficient documentation

## 2015-01-06 DIAGNOSIS — K219 Gastro-esophageal reflux disease without esophagitis: Secondary | ICD-10-CM | POA: Insufficient documentation

## 2015-01-06 LAB — BASIC METABOLIC PANEL
Anion gap: 11 (ref 5–15)
BUN: 9 mg/dL (ref 6–20)
CALCIUM: 8.9 mg/dL (ref 8.9–10.3)
CHLORIDE: 100 mmol/L — AB (ref 101–111)
CO2: 25 mmol/L (ref 22–32)
CREATININE: 0.73 mg/dL (ref 0.61–1.24)
GFR calc non Af Amer: >60 mL/min (ref >60)
GLUCOSE: 99 mg/dL (ref 65–99)
Potassium: 4.2 mmol/L (ref 3.5–5.1)
Sodium: 136 mmol/L (ref 135–145)

## 2015-01-06 LAB — CBC
HEMATOCRIT: 42.4 % (ref 39.0–52.0)
HEMOGLOBIN: 14.8 g/dL (ref 13.0–17.0)
MCH: 32.8 pg (ref 26.0–34.0)
MCHC: 34.9 g/dL (ref 30.0–36.0)
MCV: 94 fL (ref 78.0–100.0)
Platelets: 198 10*3/uL (ref 150–400)
RBC: 4.51 MIL/uL (ref 4.22–5.81)
RDW: 14.4 % (ref 11.5–15.5)
WBC: 8 10*3/uL (ref 4.0–10.5)

## 2015-01-12 ENCOUNTER — Encounter (HOSPITAL_COMMUNITY): Payer: Self-pay | Admitting: *Deleted

## 2015-01-12 ENCOUNTER — Encounter (HOSPITAL_COMMUNITY)
Admission: RE | Disposition: A | Discharge status: Home / Self Care | Payer: Self-pay | Source: Ambulatory Visit | Attending: Gastroenterology

## 2015-01-12 ENCOUNTER — Ambulatory Visit (HOSPITAL_COMMUNITY): Payer: Medicaid Other | Admitting: Anesthesiology

## 2015-01-12 ENCOUNTER — Ambulatory Visit (HOSPITAL_COMMUNITY)
Admission: RE | Admit: 2015-01-12 | Discharge: 2015-01-12 | Disposition: A | Discharge status: Home / Self Care | Payer: Medicaid Other | Source: Ambulatory Visit | Attending: Gastroenterology | Admitting: Gastroenterology

## 2015-01-12 DIAGNOSIS — I10 Essential (primary) hypertension: Secondary | ICD-10-CM | POA: Diagnosis not present

## 2015-01-12 DIAGNOSIS — K2970 Gastritis, unspecified, without bleeding: Secondary | ICD-10-CM

## 2015-01-12 DIAGNOSIS — D120 Benign neoplasm of cecum: Secondary | ICD-10-CM

## 2015-01-12 DIAGNOSIS — F17210 Nicotine dependence, cigarettes, uncomplicated: Secondary | ICD-10-CM | POA: Diagnosis not present

## 2015-01-12 DIAGNOSIS — Z79899 Other long term (current) drug therapy: Secondary | ICD-10-CM | POA: Insufficient documentation

## 2015-01-12 DIAGNOSIS — Z1211 Encounter for screening for malignant neoplasm of colon: Secondary | ICD-10-CM | POA: Diagnosis not present

## 2015-01-12 DIAGNOSIS — K219 Gastro-esophageal reflux disease without esophagitis: Secondary | ICD-10-CM | POA: Insufficient documentation

## 2015-01-12 DIAGNOSIS — F329 Major depressive disorder, single episode, unspecified: Secondary | ICD-10-CM | POA: Insufficient documentation

## 2015-01-12 DIAGNOSIS — Z7982 Long term (current) use of aspirin: Secondary | ICD-10-CM | POA: Insufficient documentation

## 2015-01-12 DIAGNOSIS — K648 Other hemorrhoids: Secondary | ICD-10-CM | POA: Insufficient documentation

## 2015-01-12 DIAGNOSIS — K2960 Other gastritis without bleeding: Secondary | ICD-10-CM | POA: Diagnosis not present

## 2015-01-12 DIAGNOSIS — K6389 Other specified diseases of intestine: Secondary | ICD-10-CM | POA: Diagnosis not present

## 2015-01-12 DIAGNOSIS — E7800 Pure hypercholesterolemia, unspecified: Secondary | ICD-10-CM | POA: Diagnosis not present

## 2015-01-12 DIAGNOSIS — D123 Benign neoplasm of transverse colon: Secondary | ICD-10-CM | POA: Insufficient documentation

## 2015-01-12 DIAGNOSIS — R1013 Epigastric pain: Secondary | ICD-10-CM | POA: Insufficient documentation

## 2015-01-12 DIAGNOSIS — D127 Benign neoplasm of rectosigmoid junction: Secondary | ICD-10-CM | POA: Insufficient documentation

## 2015-01-12 DIAGNOSIS — K621 Rectal polyp: Secondary | ICD-10-CM

## 2015-01-12 DIAGNOSIS — Z86010 Personal history of colonic polyps: Secondary | ICD-10-CM

## 2015-01-12 DIAGNOSIS — K635 Polyp of colon: Secondary | ICD-10-CM

## 2015-01-12 DIAGNOSIS — K639 Disease of intestine, unspecified: Secondary | ICD-10-CM

## 2015-01-12 HISTORY — PX: ESOPHAGOGASTRODUODENOSCOPY (EGD) WITH PROPOFOL: SHX5813

## 2015-01-12 HISTORY — PX: COLONOSCOPY WITH PROPOFOL: SHX5780

## 2015-01-12 LAB — COMPREHENSIVE METABOLIC PANEL
ALK PHOS: 51 U/L (ref 38–126)
ALT: 20 U/L (ref 17–63)
ANION GAP: 10 (ref 5–15)
AST: 26 U/L (ref 15–41)
Albumin: 3.8 g/dL (ref 3.5–5.0)
BILIRUBIN TOTAL: 0.5 mg/dL (ref 0.3–1.2)
BUN: 11 mg/dL (ref 6–20)
CALCIUM: 8.5 mg/dL — AB (ref 8.9–10.3)
CO2: 28 mmol/L (ref 22–32)
Chloride: 98 mmol/L — ABNORMAL LOW (ref 101–111)
Creatinine, Ser: 0.85 mg/dL (ref 0.61–1.24)
GFR calc non Af Amer: >60 mL/min (ref >60)
GLUCOSE: 86 mg/dL (ref 65–99)
Potassium: 4.3 mmol/L (ref 3.5–5.1)
Sodium: 136 mmol/L (ref 135–145)
TOTAL PROTEIN: 6.5 g/dL (ref 6.5–8.1)

## 2015-01-12 LAB — CBC WITH DIFFERENTIAL/PLATELET
Basophils Absolute: 0 10*3/uL (ref 0.0–0.1)
Basophils Relative: 1 %
Eosinophils Absolute: 0 10*3/uL (ref 0.0–0.7)
Eosinophils Relative: 1 %
HEMATOCRIT: 40.6 % (ref 39.0–52.0)
HEMOGLOBIN: 13.6 g/dL (ref 13.0–17.0)
LYMPHS ABS: 1.1 10*3/uL (ref 0.7–4.0)
LYMPHS PCT: 18 %
MCH: 31.9 pg (ref 26.0–34.0)
MCHC: 33.5 g/dL (ref 30.0–36.0)
MCV: 95.3 fL (ref 78.0–100.0)
MONOS PCT: 7 %
Monocytes Absolute: 0.4 10*3/uL (ref 0.1–1.0)
NEUTROS ABS: 4.6 10*3/uL (ref 1.7–7.7)
NEUTROS PCT: 75 %
Platelets: 177 10*3/uL (ref 150–400)
RBC: 4.26 MIL/uL (ref 4.22–5.81)
RDW: 14.3 % (ref 11.5–15.5)
WBC: 6.2 10*3/uL (ref 4.0–10.5)

## 2015-01-12 LAB — PROTIME-INR
INR: 1.07 (ref 0.00–1.49)
Prothrombin Time: 14.1 seconds (ref 11.6–15.2)

## 2015-01-12 SURGERY — COLONOSCOPY WITH PROPOFOL
Anesthesia: Monitor Anesthesia Care

## 2015-01-12 MED ORDER — FENTANYL CITRATE (PF) 100 MCG/2ML IJ SOLN: 25.0000 ug | INTRAMUSCULAR | Status: DC | PRN

## 2015-01-12 MED ORDER — PROPOFOL 10 MG/ML IV BOLUS
INTRAVENOUS | Status: AC
  Filled 2015-01-12: qty 20

## 2015-01-12 MED ORDER — MIDAZOLAM HCL 2 MG/2ML IJ SOLN
INTRAMUSCULAR | Status: AC
  Filled 2015-01-12: qty 2

## 2015-01-12 MED ORDER — MIDAZOLAM HCL 2 MG/2ML IJ SOLN
1.0000 mg | INTRAMUSCULAR | Status: DC | PRN
  Administered 2015-01-12: 2 mg via INTRAVENOUS

## 2015-01-12 MED ORDER — FENTANYL CITRATE (PF) 100 MCG/2ML IJ SOLN
INTRAMUSCULAR | Status: DC | PRN
  Administered 2015-01-12: 25 ug via INTRAVENOUS
  Administered 2015-01-12: 50 ug via INTRAVENOUS
  Administered 2015-01-12: 25 ug via INTRAVENOUS

## 2015-01-12 MED ORDER — LIDOCAINE VISCOUS 2 % MT SOLN
15.0000 mL | Freq: Once | OROMUCOSAL | Status: AC
  Administered 2015-01-12: 15 mL via OROMUCOSAL

## 2015-01-12 MED ORDER — SPOT INK MARKER SYRINGE KIT
PACK | SUBMUCOSAL | Status: DC | PRN
  Administered 2015-01-12: 5 mL via SUBMUCOSAL
  Administered 2015-01-12: 4 mL via SUBMUCOSAL

## 2015-01-12 MED ORDER — LACTATED RINGERS IV SOLN
INTRAVENOUS | Status: DC
  Administered 2015-01-12: 11:00:00 via INTRAVENOUS

## 2015-01-12 MED ORDER — PROPOFOL 10 MG/ML IV BOLUS
INTRAVENOUS | Status: AC
  Filled 2015-01-12: qty 40

## 2015-01-12 MED ORDER — STERILE WATER FOR IRRIGATION IR SOLN
Status: DC | PRN
  Administered 2015-01-12: 12:00:00

## 2015-01-12 MED ORDER — FENTANYL CITRATE (PF) 100 MCG/2ML IJ SOLN
INTRAMUSCULAR | Status: AC
  Filled 2015-01-12: qty 2

## 2015-01-12 MED ORDER — ONDANSETRON HCL 4 MG/2ML IJ SOLN
4.0000 mg | Freq: Once | INTRAMUSCULAR | Status: DC | PRN
Start: 2015-01-12 — End: 2015-01-12

## 2015-01-12 MED ORDER — LIDOCAINE VISCOUS 2 % MT SOLN
OROMUCOSAL | Status: AC
  Filled 2015-01-12: qty 15

## 2015-01-12 MED ORDER — PROPOFOL 10 MG/ML IV BOLUS
INTRAVENOUS | Status: DC | PRN
  Administered 2015-01-12 (×5): 10 mg via INTRAVENOUS

## 2015-01-12 MED ORDER — MIDAZOLAM HCL 5 MG/5ML IJ SOLN
INTRAMUSCULAR | Status: DC | PRN
  Administered 2015-01-12 (×2): 1 mg via INTRAVENOUS
  Administered 2015-01-12: 2 mg via INTRAVENOUS

## 2015-01-12 MED ORDER — PROPOFOL 500 MG/50ML IV EMUL
INTRAVENOUS | Status: DC | PRN
  Administered 2015-01-12: 12:00:00 via INTRAVENOUS
  Administered 2015-01-12: 150 ug/kg/min via INTRAVENOUS
  Administered 2015-01-12: 12:00:00 via INTRAVENOUS

## 2015-01-12 NOTE — H&P (Signed)
Primary Care Physician:  [name], PA-C Primary Gastroenterologist:  Dr. Oneida Alar  Pre-Procedure History & Physical: HPI:  Erik [name] is a 59 y.o. male here for PERSONAL HISTORY OF POLYPS/DYSPEPSIA-REFRACTORY GERD.  Past Medical History  Diagnosis Date  . Head trauma 2001    closed head injury; coma for 4 weeks  . Alcohol abuse   . Depression   . Abnormal stress echocardiogram   . Hypercholesterolemia   . Hypertension     Past Surgical History  Procedure Laterality Date  . Kidney surgery      >30 years ago  . Hernia repair  2012  . Cardiac cath    . Craniotomy  2001  . Head injury surgery    . Colonoscopy  2011    Dr. Oneida Alar: multiple adenomas and hyperplastic polyps    Prior to Admission medications   Medication Sig Start Date End Date Taking? Authorizing Provider  albuterol (PROVENTIL HFA;VENTOLIN HFA) 108 (90 BASE) MCG/ACT inhaler Inhale 2 puffs into the lungs every 6 (six) hours as needed for wheezing or shortness of breath. 12/08/14  Yes [name], PA-C  amitriptyline (ELAVIL) 25 MG tablet TAKE ONE TABLET BY MOUTH AT BEDTIME. Patient taking differently: TAKE ONE TABLET BY MOUTH AT BEDTIME AS NEEDED FOR SLEEP. 08/25/14  Yes Pieter Partridge, DO  aspirin 81 MG tablet Take 81 mg by mouth daily.   Yes Historical Provider, MD  dexlansoprazole (DEXILANT) 60 MG capsule Take 60 mg by mouth daily.   Yes Historical Provider, MD  FLUoxetine (PROZAC) 20 MG capsule Take 20 mg by mouth daily.   Yes Historical Provider, MD  rosuvastatin (CRESTOR) 40 MG tablet Take 1 tablet (40 mg total) by mouth daily. 12/08/14  Yes [name], PA-C  sildenafil (VIAGRA) 100 MG tablet Take 0.5-1 tablets (50-100 mg total) by mouth daily as needed for erectile dysfunction. 12/08/14  Yes [name], PA-C    Allergies as of 12/15/2014  . (No Known Allergies)    Family History  Problem Relation Age of Onset  . Coronary artery disease Other   . Ataxia Neg Hx   . Chorea Neg Hx   .  Dementia Neg Hx   . Mental retardation Neg Hx   . Migraines Neg Hx   . Multiple sclerosis Neg Hx   . Neurofibromatosis Neg Hx   . Neuropathy Neg Hx   . Parkinsonism Neg Hx   . Seizures Neg Hx   . Stroke Neg Hx   . Colon cancer Neg Hx     Social History   Social History  . Marital Status: Divorced    Spouse Name: N/A  . Number of Children: N/A  . Years of Education: N/A   Occupational History  . Full time    Social History Main Topics  . Smoking status: Current Every Day Smoker -- 0.25 packs/day    Types: Cigarettes  . Smokeless tobacco: Never Used     Comment: patient is aware he needs to quit  . Alcohol Use: 0.0 oz/week    0 Standard drinks or equivalent per week     Comment: beer occ, history of ETOH abuse in remote past.   . Drug Use: Yes    Special: Marijuana     Comment: marijuana last 2012  . Sexual Activity:    Partners: Female   Other Topics Concern  . Not on file   Social History Narrative   Single    Review of Systems: See HPI, otherwise negative ROS  Physical Exam: Pulse 109  Temp(Src) 98.3 F (36.8 C) (Oral) General:   Alert,  pleasant and cooperative in NAD Head:  Normocephalic and atraumatic. Neck:  Supple; Lungs:  Clear throughout to auscultation.    Heart:  Regular rate and rhythm. Abdomen:  Soft, nontender and nondistended. Normal bowel sounds, without guarding, and without rebound.   Neurologic:  Alert and  oriented x4;  grossly normal neurologically.  Impression/Plan:    PERSONAL HISTORY OF POLYPS/DYSPEPSIA-REFRACTORY GERD.  PLAN:  1. TCS/EGD TODAY

## 2015-01-12 NOTE — Anesthesia Postprocedure Evaluation (Signed)
Anesthesia Post Note  Patient: Erik [name]  Procedure(s) Performed: Procedure(s) (LRB): COLONOSCOPY WITH PROPOFOL (N/A) ESOPHAGOGASTRODUODENOSCOPY (EGD) WITH PROPOFOL (N/A)  Patient location during evaluation: PACU Anesthesia Type: MAC Level of consciousness: awake and alert and patient cooperative Pain management: pain level controlled Vital Signs Assessment: post-procedure vital signs reviewed and stable Respiratory status: spontaneous breathing and patient connected to face mask oxygen Cardiovascular status: stable Anesthetic complications: no    Last Vitals:  Filed Vitals:   01/12/15 1035 01/12/15 1100  BP: 140/81 127/77  Pulse:    Temp:    Resp: 13 19    Last Pain: There were no vitals filed for this visit.               [name] J

## 2015-01-12 NOTE — Anesthesia Preprocedure Evaluation (Signed)
Anesthesia Evaluation  Patient identified by MRN, date of birth, ID band Patient awake    Reviewed: Allergy & Precautions, NPO status , Patient's Chart, lab work & pertinent test results  Airway Mallampati: II       Dental  (+) Teeth Intact, Missing, Poor Dentition, Dental Advisory Given,    Pulmonary Current Smoker,    Pulmonary exam normal        Cardiovascular hypertension, Pt. on medications + CAD  Normal cardiovascular exam     Neuro/Psych  Headaches,    GI/Hepatic GERD  Medicated and Poorly Controlled,(+)     substance abuse  alcohol use,   Endo/Other    Renal/GU      Musculoskeletal   Abdominal (+) + obese,   Peds  Hematology   Anesthesia Other Findings   Reproductive/Obstetrics                             Anesthesia Physical Anesthesia Plan  ASA: III  Anesthesia Plan: MAC   Post-op Pain Management:    Induction: Intravenous  Airway Management Planned: Nasal Cannula  Additional Equipment:   Intra-op Plan:   Post-operative Plan:   Informed Consent: I have reviewed the patients History and Physical, chart, labs and discussed the procedure including the risks, benefits and alternatives for the proposed anesthesia with the patient or authorized representative who has indicated his/her understanding and acceptance.   Dental advisory given  Plan Discussed with: CRNA  Anesthesia Plan Comments:         Anesthesia Quick Evaluation

## 2015-01-12 NOTE — Discharge Instructions (Signed)
You have a colon mass IN YOUR LEFT COLON. YOU HAD 2 POLYPS REMOVED. YOU HAVE SMALL internal hemorrhoids. You have gastritis DUE TO ASA.  I biopsied your stomach.    YOU NEED TO HAVE SURGERY TO REMOVE THE MASS.  YOU NEED A CHEST X-RAY, CT SCAN, AND LABS.   FOLLOW A LOW FAT DIET. AVOID ITEMS THAT CAUSE BLOATING. SEE INFO BELOW.  USE PROTONIX OR DEXILANT TO CONTROL HEARTBURN.  AVOID ITEMS THAT TRIGGER GASTRITIS. SEE INFO BELOW.  YOUR BIOPSY RESULTS WILL BE AVAILABLE IN MY CHART DEC 9 AND MY OFFICE WILL CONTACT YOU IN 10-14 DAYS WITH YOUR RESULTS.   Next colonoscopy in 1 year. YOUR SISTERS, BROTHERS, CHILDREN, AND PARENTS NEED TO HAVE A COLONOSCOPY STARTING AT THE AGE OF 40 and then every 5 years.   ENDOSCOPY Care After Read the instructions outlined below and refer to this sheet in the next week. These discharge instructions provide you with general information on caring for yourself after you leave the hospital. While your treatment has been planned according to the most current medical practices available, unavoidable complications occasionally occur. If you have any problems or questions after discharge, call DR. Eri Platten, [phone].  ACTIVITY  You may resume your regular activity, but move at a slower pace for the next 24 hours.   Take frequent rest periods for the next 24 hours.   Walking will help get rid of the air and reduce the bloated feeling in your belly (abdomen).   No driving for 24 hours (because of the medicine (anesthesia) used during the test).   You may shower.   Do not sign any important legal documents or operate any machinery for 24 hours (because of the anesthesia used during the test).    NUTRITION  Drink plenty of fluids.   You may resume your normal diet as instructed by your doctor.   Begin with a light meal and progress to your normal diet. Heavy or fried foods are harder to digest and may make you feel sick to your stomach (nauseated).   Avoid  alcoholic beverages for 24 hours or as instructed.    MEDICATIONS  You may resume your normal medications.   WHAT YOU CAN EXPECT TODAY  Some feelings of bloating in the abdomen.   Passage of more gas than usual.   Spotting of blood in your stool or on the toilet paper  .  IF YOU HAD POLYPS REMOVED DURING THE ENDOSCOPY:  Eat a soft diet IF YOU HAVE NAUSEA, BLOATING, ABDOMINAL PAIN, OR VOMITING.    FINDING OUT THE RESULTS OF YOUR TEST Not all test results are available during your visit. DR. Oneida Alar WILL CALL YOU WITHIN 14 DAYS OF YOUR PROCEDUE WITH YOUR RESULTS. Do not assume everything is normal if you have not heard from DR. Yarnell Kozloski IN TWO WEEKS, CALL HER OFFICE AT [phone].  SEEK IMMEDIATE MEDICAL ATTENTION AND CALL THE OFFICE: [phone] IF:  You have more than a spotting of blood in your stool.   Your belly is swollen (abdominal distention).   You are nauseated or vomiting.   You have a temperature over 101F.   You have abdominal pain or discomfort that is severe or gets worse throughout the day.   Gastritis  Gastritis is an inflammation (the body's way of reacting to injury and/or infection) of the stomach. It is often caused by viral or bacterial (germ) infections. It can also be caused BY ALCOHOL, ASPIRIN, BC/GOODY POWDER'S, (IBUPROFEN) MOTRIN, OR ALEVE (NAPROXEN), chemicals (  including alcohol), SPICY FOODS, and medications. This illness may be associated with generalized malaise (feeling tired, not well), UPPER ABDOMINAL STOMACH cramps, and fever. One common bacterial cause of gastritis is an organism known as H. Pylori. This can be treated with antibiotics.    Low-Fat Diet BREADS, CEREALS, PASTA, RICE, DRIED PEAS, AND BEANS These products are high in carbohydrates and most are low in fat. Therefore, they can be increased in the diet as substitutes for fatty foods. They too, however, contain calories and should not be eaten in excess. Cereals can be eaten for  snacks as well as for breakfast.  Include foods that contain fiber (fruits, vegetables, whole grains, and legumes). Research shows that fiber may lower blood cholesterol levels, especially the water-soluble fiber found in fruits, vegetables, oat products, and legumes. FRUITS AND VEGETABLES It is good to eat fruits and vegetables. Besides being sources of fiber, both are rich in vitamins and some minerals. They help you get the daily allowances of these nutrients. Fruits and vegetables can be used for snacks and desserts. MEATS Limit lean meat, chicken, [place], and fish to no more than 6 ounces per day. Beef, Pork, and Lamb Use lean cuts of beef, pork, and lamb. Lean cuts include:  Extra-lean ground beef.  Arm roast.  Sirloin tip.  Center-cut ham.  Round steak.  Loin chops.  Rump roast.  Tenderloin.  Trim all fat off the outside of meats before cooking. It is not necessary to severely decrease the intake of red meat, but lean choices should be made. Lean meat is rich in protein and contains a highly absorbable form of iron. Premenopausal women, in particular, should avoid reducing lean red meat because this could increase the risk for low red blood cells (iron-deficiency anemia). The organ meats, such as liver, sweetbreads, kidneys, and brain are very rich in cholesterol. They should be limited. Chicken and [place] These are good sources of protein. The fat of poultry can be reduced by removing the skin and underlying fat layers before cooking. Chicken and [place] can be substituted for lean red meat in the diet. Poultry should not be fried or covered with high-fat sauces. Fish and Shellfish Fish is a good source of protein. Shellfish contain cholesterol, but they usually are low in saturated fatty acids. The preparation of fish is important. Like chicken and [place], they should not be fried or covered with high-fat sauces. EGGS Egg whites contain no fat or cholesterol. They can be eaten often.  Try 1 to 2 egg whites instead of whole eggs in recipes or use egg substitutes that do not contain yolk. MILK AND DAIRY PRODUCTS Use skim or 1% milk instead of 2% or whole milk. Decrease whole milk, natural, and processed cheeses. Use nonfat or low-fat (2%) cottage cheese or low-fat cheeses made from vegetable oils. Choose nonfat or low-fat (1 to 2%) yogurt. Experiment with evaporated skim milk in recipes that call for heavy cream. Substitute low-fat yogurt or low-fat cottage cheese for sour cream in dips and salad dressings. Have at least 2 servings of low-fat dairy products, such as 2 glasses of skim (or 1%) milk each day to help get your daily calcium intake.  FATS AND OILS Reduce the total intake of fats, especially saturated fat. Butterfat, lard, and beef fats are high in saturated fat and cholesterol. These should be avoided as much as possible. Vegetable fats do not contain cholesterol, but certain vegetable fats, such as coconut oil, palm oil, and palm kernel oil are  very high in saturated fats. These should be limited. These fats are often used in bakery goods, processed foods, popcorn, oils, and nondairy creamers. Vegetable shortenings and some peanut butters contain hydrogenated oils, which are also saturated fats. Read the labels on these foods and check for saturated vegetable oils. Unsaturated vegetable oils and fats do not raise blood cholesterol. However, they should be limited because they are fats and are high in calories. Total fat should still be limited to 30% of your daily caloric intake. Desirable liquid vegetable oils are corn oil, cottonseed oil, olive oil, canola oil, safflower oil, soybean oil, and sunflower oil. Peanut oil is not as good, but small amounts are acceptable. Buy a heart-healthy tub margarine that has no partially hydrogenated oils in the ingredients. Mayonnaise and salad dressings often are made from unsaturated fats, but they should also be limited because of their  high calorie and fat content. Seeds, nuts, peanut butter, olives, and avocados are high in fat, but the fat is mainly the unsaturated type. These foods should be limited mainly to avoid excess calories and fat. OTHER EATING TIPS Snacks  Most sweets should be limited as snacks. They tend to be rich in calories and fats, and their caloric content outweighs their nutritional value. Some good choices in snacks are graham crackers, melba toast, soda crackers, bagels (no egg), English muffins, fruits, and vegetables. These snacks are preferable to snack crackers, [place] fries, and chips. Popcorn should be air-popped or cooked in small amounts of liquid vegetable oil. Desserts Eat fruit, low-fat yogurt, and fruit ices. AVOID pastries, cake, and cookies. Sherbet, angel food cake, gelatin dessert, frozen low-fat yogurt, or other frozen products that do not contain saturated fat (pure fruit juice bars, frozen ice pops) are also acceptable.  COOKING METHODS Choose those methods that use little or no fat. They include: Poaching.  Braising.  Steaming.  Grilling.  Baking.  Stir-frying.  Broiling.  Microwaving.  Foods can be cooked in a nonstick pan without added fat, or use a nonfat cooking spray in regular cookware. Limit fried foods and avoid frying in saturated fat. Add moisture to lean meats by using water, broth, cooking wines, and other nonfat or low-fat sauces along with the cooking methods mentioned above. Soups and stews should be chilled after cooking. The fat that forms on top after a few hours in the refrigerator should be skimmed off. When preparing meals, avoid using excess salt. Salt can contribute to raising blood pressure in some people. EATING AWAY FROM HOME Order entres, potatoes, and vegetables without sauces or butter. When meat exceeds the size of a deck of cards (3 to 4 ounces), the rest can be taken home for another meal. Choose vegetable or fruit salads and ask for low-calorie salad  dressings to be served on the side. Use dressings sparingly. Limit high-fat toppings, such as bacon, crumbled eggs, cheese, sunflower seeds, and olives. Ask for heart-healthy tub margarine instead of butter.  Polyps, Colon  A polyp is extra tissue that grows inside your body. Colon polyps grow in the large intestine. The large intestine, also called the colon, is part of your digestive system. It is a long, hollow tube at the end of your digestive tract where your body makes and stores stool. Most polyps are not dangerous. They are benign. This means they are not cancerous. But over time, some types of polyps can turn into cancer. Polyps that are smaller than a pea are usually not harmful. But larger polyps could  someday become or may already be cancerous. To be safe, doctors remove all polyps and test them.    PREVENTION There is not one sure way to prevent polyps. You might be able to lower your risk of getting them if you:  Eat more fruits and vegetables and less fatty food.   Do not smoke.   Avoid alcohol.   Exercise every day.   Lose weight if you are overweight.   Eating more calcium and folate can also lower your risk of getting polyps. Some foods that are rich in calcium are milk, cheese, and broccoli. Some foods that are rich in folate are chickpeas, kidney beans, and spinach.

## 2015-01-12 NOTE — Op Note (Addendum)
[facility] [address] [place], [address]   COLONOSCOPY PROCEDURE REPORT  PATIENT: Erik [name], Erik [name]  MR#: [id number] BIRTHDATE: 1955-04-22 , 34  yrs. old GENDER: male ENDOSCOPIST: Danie Binder, MD REFERRED [id number] McElroy, PA-C PROCEDURE DATE:  01/12/2015 PROCEDURE:   Colonoscopy with biopsy and Submucosal injection-SPOT INDICATIONS:high risk patient with personal history of colonic polyps.  MEDICATIONS: Monitored anesthesia care  DESCRIPTION OF PROCEDURE:    Physical exam was performed.  Informed consent was obtained from the patient after explaining the benefits, risks, and alternatives to procedure.  The patient was connected to monitor and placed in left lateral position. Continuous oxygen was provided by nasal cannula and IV medicine administered through an indwelling cannula.  After administration of sedation and rectal exam, the patients rectum was intubated and the     colonoscope was advanced under direct visualization to the ILEUM The scope was removed slowly by carefully examining the color, texture, anatomy, and integrity mucosa on the way out.  The patient was recovered in endoscopy and discharged home in satisfactory condition. Estimated blood loss is zero unless otherwise noted in this procedure report.    COLON FINDINGS: A half circumferential fungating mass( 5-6 CM IN LENGTH) was found at the splenic flexure.  Multiple biopsies were performed using cold forceps.  A 1 CC SPOT tattoo was applied. PROXIMAL AND DISTAL TO MASS  , Three sessile polyps ranging from 4 to 73mm in size were found in the rectum, sigmoid colon, and transverse colon.  A polypectomy was performed with cold forceps. , The examined terminal ileum appeared to be normal.  , and Small internal hemorrhoids were found.  PREP QUALITY: excellent.  CECAL W/D TIME: 17       minutes COMPLICATIONS: None  ENDOSCOPIC IMPRESSION: 1.   MASS AT SPLENIC FLEXURE 2.   THREE  COLORECTAL POLYPS REMOVED 3.   HEMORRHOIDS, INTERNAL  RECOMMENDATIONS: SURGERY TO REMOVE COLON MASS. ORDER CHEST X-RAY, CT SCAN, AND LABS. FOLLOW A LOW FAT DIET.  AVOID ITEMS THAT CAUSE BLOATING. USE PROTONIX OR DEXILANT TO CONTROL HEARTBURN. AVOID ITEMS THAT TRIGGER GASTRITIS. AWAIT BIOPSY RESULTS. Next colonoscopy in 1 year.  ALL SISTERS, BROTHERS, CHILDREN, AND PARENTS NEED TO HAVE A COLONOSCOPY STARTING AT THE AGE OF 40 and then every 5 years.   _______________________________ Lorrin MaisDanie Binder, MD 2015/01/26 2:14 PM Revised: 01/26/15 2:14 PM   CPT CODES: ICD CODES:  The ICD and CPT codes recommended by this software are interpretations from the data that the clinical staff has captured with the software.  The verification of the translation of this report to the ICD and CPT codes and modifiers is the sole responsibility of the health care institution and practicing physician where this report was generated.  [employer] will not be held responsible for the validity of the ICD and CPT codes included on this report.  AMA assumes no liability for data contained or not contained herein. CPT is a Designer, television/film set of the [employer].

## 2015-01-12 NOTE — Transfer of Care (Signed)
Immediate Anesthesia Transfer of Care Note  Patient: [name]  Procedure(s) Performed: Procedure(s) with comments: COLONOSCOPY WITH PROPOFOL (N/A) - 1030 ESOPHAGOGASTRODUODENOSCOPY (EGD) WITH PROPOFOL (N/A)  Patient Location: PACU  Anesthesia Type:MAC  Level of Consciousness: awake and patient cooperative  Airway & Oxygen Therapy: Patient Spontanous Breathing and Patient connected to face mask oxygen  Post-op Assessment: Report given to RN, Post -op Vital signs reviewed and stable and Patient moving all extremities  Post vital signs: Reviewed and stable  Last Vitals:  Filed Vitals:   01/12/15 1035 01/12/15 1100  BP: 140/81 127/77  Pulse:    Temp:    Resp: 13 19    Complications: No apparent anesthesia complications

## 2015-01-12 NOTE — Op Note (Addendum)
[facility] [address] [place], [address]   ENDOSCOPY PROCEDURE REPORT  PATIENT: Erik [name], Erik [name]  MR#: [id number] BIRTHDATE: September 27, 1955 , 11  yrs. old GENDER: male  ENDOSCOPIST: Danie Binder, MD REFERRED [id number] McElroy, PA-C  PROCEDURE DATE: 01/12/2015 PROCEDURE:   EGD w/ biopsy INDICATIONS:screening for varices. USES ASA DAILY W/O PPI. MEDICATIONS: Monitored anesthesia care   TOPICAL ANESTHETIC: Viscous Xylocaine  ASA CLASS:  DESCRIPTION OF PROCEDURE:     Physical exam was performed.  Informed consent was obtained from the patient after explaining the benefits, risks, and alternatives to the procedure.  The patient was connected to the monitor and placed in the left lateral position.  Continuous oxygen was provided by nasal cannula and IV medicine administered through an indwelling cannula.  After administration of sedation, the patients esophagus was intubated and the     endoscope was advanced under direct visualization to the second portion of the duodenum.  The scope was removed slowly by carefully examining the color, texture, anatomy, and integrity of the mucosa on the way out.  The patient was recovered in endoscopy and discharged home in satisfactory condition.  Estimated blood loss is zero unless otherwise noted in this procedure report.    ESOPHAGUS: The mucosa of the esophagus appeared normal.  STOMACH: Moderate erosive gastritis (inflammation) was found in the gastric antrum.  Multiple biopsies were performed using cold forceps. DUODENUM: The duodenal mucosa showed no abnormalities in the bulb and 2nd part of the duodenum.    NO VARICES IS ESOPHAGUS, STOMACH, OR DUODENUM. COMPLICATIONS: There were no immediate complications.  ENDOSCOPIC IMPRESSION: Moderate erosive gastritis most likely due to ASA use  RECOMMENDATIONS: SURGERY TO REMOVE COLON MASS. ORDER CHEST X-RAY, CT SCAN, AND LABS. FOLLOW A LOW FAT DIET.  AVOID ITEMS THAT  CAUSE BLOATING. USE PROTONIX OR DEXILANT TO CONTROL HEARTBURN. AVOID ITEMS THAT TRIGGER GASTRITIS. AWAIT  BIOPSY RESULTS. Next colonoscopy in 1 year.  ALL SISTERS, BROTHERS, CHILDREN, AND PARENTS NEED TO HAVE A COLONOSCOPY STARTING AT THE AGE OF 40 and then every 5 years.  REPEAT EXAM: _______________________________ Lorrin MaisDanie Binder, MD 02-06-2015 2:04 PM Revised: 2015-02-06 2:04 PM  CPT CODES: ICD CODES:  The ICD and CPT codes recommended by this software are interpretations from the data that the clinical staff has captured with the software.  The verification of the translation of this report to the ICD and CPT codes and modifiers is the sole responsibility of the health care institution and practicing physician where this report was generated.  [employer] will not be held responsible for the validity of the ICD and CPT codes included on this report.  AMA assumes no liability for data contained or not contained herein. CPT is a Designer, television/film set of the [employer].

## 2015-01-12 NOTE — Progress Notes (Signed)
REVIEWED-NO ADDITIONAL RECOMMENDATIONS. 

## 2015-01-13 ENCOUNTER — Other Ambulatory Visit: Payer: Self-pay

## 2015-01-13 ENCOUNTER — Telehealth: Payer: Self-pay | Admitting: Gastroenterology

## 2015-01-13 DIAGNOSIS — K6389 Other specified diseases of intestine: Secondary | ICD-10-CM

## 2015-01-13 LAB — CEA: CEA: 6.6 ng/mL — ABNORMAL HIGH (ref 0.0–4.7)

## 2015-01-13 NOTE — Telephone Encounter (Signed)
SURGERY REFERRAL TO REMOVE LEFT COLON MASS. NEEDS WITHIN 7 DAYS CHEST X-RAY, CT SCAN, AND LABS.   Next colonoscopy in 1 year.  A

## 2015-01-13 NOTE — Telephone Encounter (Signed)
PLEASE CALL PT. HIS LABS ARE NORMAL. FAX TO DR. Arnoldo Morale.

## 2015-01-13 NOTE — Telephone Encounter (Signed)
Labs already completed. Referral made to [facility] in [place] due to insurance status.   CT scans set for 01/18/2015.  Called pt and LMOM regarding appt

## 2015-01-13 NOTE — Telephone Encounter (Signed)
Labs cc'ed to Dr Arnoldo Morale and reminder in epic to have colonoscopy in one year

## 2015-01-14 NOTE — Telephone Encounter (Signed)
PLEASE CALL PT. HIS COLON BIOPSIES DID NOT SHOW CANCER BUT IT DID SHOW AN ADVANCED POLYP. THE MASS STILL NEEDS TO BE REMOVED BECAUSE IT IS NEARLY OBSTRUCTING HIS COLON. His stomach Bx shows  gastritis. REPEAT TCS IN ONE YEAR.

## 2015-01-14 NOTE — Telephone Encounter (Signed)
Pt has an appointment with [facility]  ON 01/21/15 @ 8:00 AM. Pt is aware of appointment. We are working with him to find a ride to get him there. Can not afford to pay out of pocket to go see Dr. Arnoldo Morale.

## 2015-01-14 NOTE — Telephone Encounter (Signed)
Path report cc'ed to Dr Arnoldo Morale

## 2015-01-15 ENCOUNTER — Encounter (HOSPITAL_COMMUNITY): Payer: Self-pay | Admitting: Gastroenterology

## 2015-01-18 ENCOUNTER — Other Ambulatory Visit (HOSPITAL_COMMUNITY): Payer: Self-pay

## 2015-01-18 ENCOUNTER — Ambulatory Visit (HOSPITAL_COMMUNITY)
Admission: RE | Admit: 2015-01-18 | Discharge: 2015-01-18 | Disposition: A | Discharge status: Home / Self Care | Payer: Medicaid Other | Source: Ambulatory Visit | Attending: Gastroenterology | Admitting: Gastroenterology

## 2015-01-18 DIAGNOSIS — I10 Essential (primary) hypertension: Secondary | ICD-10-CM

## 2015-01-18 DIAGNOSIS — I700 Atherosclerosis of aorta: Secondary | ICD-10-CM | POA: Insufficient documentation

## 2015-01-18 DIAGNOSIS — F17200 Nicotine dependence, unspecified, uncomplicated: Secondary | ICD-10-CM

## 2015-01-18 DIAGNOSIS — K6389 Other specified diseases of intestine: Secondary | ICD-10-CM | POA: Insufficient documentation

## 2015-01-18 DIAGNOSIS — K7689 Other specified diseases of liver: Secondary | ICD-10-CM | POA: Diagnosis not present

## 2015-01-18 MED ORDER — IOHEXOL 300 MG/ML  SOLN
100.0000 mL | Freq: Once | INTRAMUSCULAR | Status: AC | PRN
  Administered 2015-01-18: 100 mL via INTRAVENOUS

## 2015-01-19 NOTE — Progress Notes (Signed)
Quick Note:  Pt is aware. He has appt at [facility], just has to confirm the date and the time. I gave him the phone number of [phone]. ______

## 2015-01-21 ENCOUNTER — Ambulatory Visit (INDEPENDENT_AMBULATORY_CARE_PROVIDER_SITE_OTHER): Payer: Self-pay | Admitting: General Surgery

## 2015-01-21 ENCOUNTER — Encounter: Payer: Self-pay | Admitting: General Surgery

## 2015-01-21 VITALS — BP 123/81 | HR 101 | Temp 98.3°F | Ht 74.0 in | Wt 232.4 lb

## 2015-01-21 DIAGNOSIS — D126 Benign neoplasm of colon, unspecified: Secondary | ICD-10-CM

## 2015-01-21 MED ORDER — POLYETHYLENE GLYCOL 3350 17 GM/SCOOP PO POWD: 1.0000 | Freq: Once | ORAL | Status: DC

## 2015-01-21 MED ORDER — BISACODYL 5 MG PO TBEC: 20.0000 mg | DELAYED_RELEASE_TABLET | Freq: Once | ORAL | Status: DC

## 2015-01-21 NOTE — Patient Instructions (Addendum)
We will arrange to complete your surgery to remove the portion of your Colon that has this (Pre-cancerous) Polyp. Our office will call you with the details of this appointment. We are planning on January 4th, 2017.  Please see your blue pre-care sheet included in your packet today.  Also, review your bowel prep information that is included. I will send your medications to your pharmacy.  If you read through all of the information given to you today and you have ANY questions what so ever, please call our office and speak with a nurse. The nurse that took care of you today at your appointment was [employer].

## 2015-01-21 NOTE — Progress Notes (Signed)
Patient ID: Erik [name], male   DOB: Jan 07, 1956, 59 y.o.   MRN: [id number]  CC: COLON MASS  HPI Erik [name] is a 59 y.o. male  Presenting to clinic for evaluation of a recently discovered colonic mass. Patient states that he was having a screening colonoscopy for which this mass was found. He denies any blood per rectum or changes in stool caliber. He's been having normal appetite and normal bowel habits. He was complaining of heartburn prior to his upper and lower endoscopy and was found to have gastritis. He does state that since the biopsy he has had some abdominal discomfort in his upper abdomen to the left of midline but it has been vague and nonspecific. Prior to his colonoscopy he is in his usual state of health and he denies any fevers, chills, nausea, vomiting, diarrhea, constipation, chest pain, shortness of breath. The biopsy of the mass came back as a tubular adenoma with high-grade dysplasia.  HPI  Past Medical History  Diagnosis Date  . Head trauma 2001    closed head injury; coma for 4 weeks  . Depression   . Abnormal stress echocardiogram   . Hypercholesterolemia   . Hypertension   . Alcohol abuse     Heavy Use up until 2010    Past Surgical History  Procedure Laterality Date  . Kidney surgery      >30 years ago  . Cardiac cath    . Craniotomy  2001  . Head injury surgery    . Colonoscopy  2011    Dr. Oneida Alar: multiple adenomas and hyperplastic polyps  . Colonoscopy with propofol N/A 01/12/2015    Procedure: COLONOSCOPY WITH PROPOFOL;  Surgeon: Danie Binder, MD;  [facility];  Service: Endoscopy;  Laterality: N/A;  1030  . Esophagogastroduodenoscopy (egd) with propofol N/A 01/12/2015    Procedure: ESOPHAGOGASTRODUODENOSCOPY (EGD) WITH PROPOFOL;  Surgeon: Danie Binder, MD;  Location: [facility];  Service: Endoscopy;  Laterality: N/A;  . Hernia repair Right 2012    Inguinal- Forestine Na    Family History  Problem Relation Age of Onset  .  Ataxia Neg Hx   . Chorea Neg Hx   . Dementia Neg Hx   . Mental retardation Neg Hx   . Migraines Neg Hx   . Multiple sclerosis Neg Hx   . Neurofibromatosis Neg Hx   . Neuropathy Neg Hx   . Parkinsonism Neg Hx   . Seizures Neg Hx   . Stroke Neg Hx   . Colon cancer Neg Hx   . Pulmonary embolism Mother   . Deep vein thrombosis Mother   . Heart disease Father   . Cancer Father     Leukemia    Social History Social History  Substance Use Topics  . Smoking status: Current Every Day Smoker -- 0.25 packs/day    Types: Cigarettes  . Smokeless tobacco: Never Used     Comment: patient is aware he needs to quit  . Alcohol Use: 0.0 oz/week    0 Standard drinks or equivalent per week     Comment: beer occ, history of ETOH abuse in remote past.     No Known Allergies  Current Outpatient Prescriptions  Medication Sig Dispense Refill  . albuterol (PROVENTIL HFA;VENTOLIN HFA) 108 (90 BASE) MCG/ACT inhaler Inhale 2 puffs into the lungs every 6 (six) hours as needed for wheezing or shortness of breath. 1 Inhaler 1  . amitriptyline (ELAVIL) 25 MG tablet TAKE ONE  TABLET BY MOUTH AT BEDTIME. (Patient taking differently: TAKE ONE TABLET BY MOUTH AT BEDTIME. prn) 30 tablet 2  . dexlansoprazole (DEXILANT) 60 MG capsule Take 60 mg by mouth daily.    Marland Kitchen FLUoxetine (PROZAC) 20 MG capsule Take 20 mg by mouth daily.    . rosuvastatin (CRESTOR) 40 MG tablet Take 1 tablet (40 mg total) by mouth daily. 90 tablet 1  . sildenafil (VIAGRA) 100 MG tablet Take 0.5-1 tablets (50-100 mg total) by mouth daily as needed for erectile dysfunction. 10 tablet 1  . bisacodyl (DULCOLAX) 5 MG EC tablet Take 4 tablets (20 mg total) by mouth once. 4 tablet 0  . polyethylene glycol powder (GLYCOLAX/MIRALAX) powder Take 255 g by mouth once. 255 g 0   No current facility-administered medications for this visit.     Review of Systems A  Multi-point review of systems was asked and was negative except for the findings  documented in the history of present illness  Physical Exam Blood pressure 123/81, pulse 101, temperature 98.3 F (36.8 C), temperature source Oral, height 6\' 2"  (1.88 m), weight 105.416 kg (232 lb 6.4 oz). CONSTITUTIONAL:  No acute distress. EYES: Pupils are equal, round, and reactive to light, Sclera are non-icteric. EARS, NOSE, MOUTH AND THROAT: The oropharynx is clear. The oral mucosa is pink and moist. Hearing is intact to voice. LYMPH NODES:  Lymph nodes in the neck are normal. RESPIRATORY:  Lungs are clear. There is normal respiratory effort, with equal breath sounds bilaterally, and without pathologic use of accessory muscles. CARDIOVASCULAR: Heart is regular without murmurs, gallops, or rubs. GI: The abdomen is soft, nontender, and nondistended. There are no palpable masses. There is no hepatosplenomegaly. There are normal bowel sounds in all quadrants. Well-healed right inguinal hernia incision. GU: Rectal deferred.   MUSCULOSKELETAL: Normal muscle strength and tone. No cyanosis or edema.   SKIN: Turgor is good and there are no pathologic skin lesions or ulcers. NEUROLOGIC: Motor and sensation is grossly normal. Cranial nerves are grossly intact. PSYCH:  Oriented to person, place and time. Affect is normal.  Data Reviewed  I independently reviewed his colonoscopy, CT, labs. Colonoscopy does show a large mass that is labeled as being at the splenic flexure. It is not bleeding and not currently obstructing. CT findings show what appears to be an apple core lesion of the mid transverse colon. Labs, chest x-ray, EKG are all within normal limits with the exception of an elevated CEA I have personally reviewed the patient's imaging, laboratory findings and medical records.    Assessment     transverse colon tubovillous adenoma with high-grade dysplasia.     Plan     had a long conversation with the patient about the finding of this tubovillous adenoma. Discussed that based on the  appearance of the colonoscopy and the CT as well as the elevated CEA that this may actually represent a colon cancer rather than a tubovillous adenoma.  Due to the size of the lesion seen on colonoscopy this result is likely just of the area that was sampled not indicative of the entire tumor. Discussed that we would treat him as if he had colon cancer at this point. The plan would be for an attempted laparoscopic hemicolectomy. Had a long conversation about right versus left extended hemicolectomy. We discussed in detail that the actual location of the tumor would dictate which surgery he needed based on its blood supply. The risks, benefits, alternatives of a laparoscopic hemicolectomy were discussed in  detail. The specific risks were of injury to an unassociated segment of bowel, inability to complete a laparoscopic, need for an ostomy either indoor loop, possible need for a prolonged hospital stay. Patient voiced understanding and wishes to proceed. We will complete his anesthesia workup as an outpatient and plan for surgery on 02/10/2015.      Time spent with the patient was 60 minutes, with more than 50% of the time spent in face-to-face education, counseling and care coordination.     [name], MD FACS General Surgeon 01/21/2015, 6:55 PM

## 2015-01-22 ENCOUNTER — Ambulatory Visit: Payer: Self-pay | Admitting: Surgery

## 2015-01-25 ENCOUNTER — Telehealth: Payer: Self-pay | Admitting: General Surgery

## 2015-01-25 NOTE — Telephone Encounter (Signed)
Pt advised of pre op date/time and sx date. Sx: 02/10/15 with Dr Gabrielle Dare left hemicolectomy. Pre op: 02/02/15 @ 11:00 am--Office.  Please make sure patient has prep kit prior to surgery.

## 2015-01-26 ENCOUNTER — Telehealth: Payer: Self-pay | Admitting: Surgery

## 2015-01-26 NOTE — Telephone Encounter (Signed)
Mayra Reel [phone] called regarding the patient. In her message she didn't leave a reason.

## 2015-01-26 NOTE — Telephone Encounter (Signed)
Bowel Prep reviewed at appointment and medications have been sent to pharmacy.

## 2015-01-26 NOTE — Telephone Encounter (Signed)
Called phone number back at this time. This number is to a congretional nursing program. Left message on voicemail that I cannot give any information to this Erik [name] until a consent is faxed over stating that it is ok for me to speak with Ms. Erik [name]. Asked to call back with any other questions.

## 2015-01-27 ENCOUNTER — Telehealth: Payer: Self-pay | Admitting: General Surgery

## 2015-01-27 NOTE — Telephone Encounter (Signed)
Patient would like for you to call him regarding scheduling surgery appointment

## 2015-01-27 NOTE — Telephone Encounter (Signed)
Patient does not have transportation to come in for office pre-op. This was discussed at appointment with patient and all labs, EKG, and CXR have been done recently.   We need to change his pre-op to phone interview please and then call patient with new information.

## 2015-01-28 NOTE — Telephone Encounter (Signed)
Patient has been advised that his office pre op has been changed to a telephone interview at 02/02/15 between 1-5:00PM.

## 2015-02-02 ENCOUNTER — Telehealth: Payer: Self-pay

## 2015-02-02 ENCOUNTER — Other Ambulatory Visit: Payer: Self-pay

## 2015-02-02 ENCOUNTER — Inpatient Hospital Stay: Admission: RE | Admit: 2015-02-02 | Payer: Self-pay | Source: Ambulatory Visit

## 2015-02-02 NOTE — Telephone Encounter (Signed)
Received a call from [facility] in Pre Admission testing stating this pt was scheduled for a telephone interview for upcoming surgery on 02-10-15 with Dr. Adonis Huguenin. During the interview the pt had stated he had brain trauma some time ago and is currently seeing a Neurologist for his balance issues and fainting spells. He will get a concussion sometimes from these calls. Dr. Adonis Huguenin stated he has some cardiac issues which will require an actual pre Admit testing appt. [name] has asked if this can be changed to an appt due to issues stated and cardiac problems. She has spoken with the pt and he is aware this is going to be changed.

## 2015-02-02 NOTE — Telephone Encounter (Signed)
[facility] called and states that pt is out of Dexilant and he can't afford the medication.  I put samples at the from desk to get patient through his surgery .  [facility] wants to know what the plan is for the patient about his medications since the Dexilant is to expensive.

## 2015-02-02 NOTE — Telephone Encounter (Signed)
Due to transportation issues is why the telephone pre op was scheduled, i have called patient to explain to him he needs to actually come in for his preOp. And it has been scheduled for Thursday [facility],. Patient is aware and will try and find a ride.

## 2015-02-02 NOTE — Telephone Encounter (Signed)
Needs to fill out patient assistance for Dexilant.

## 2015-02-03 NOTE — Progress Notes (Unsigned)
B/P: 154/90, Pulse, 101 taken by Mayra Reel, RN

## 2015-02-03 NOTE — Telephone Encounter (Signed)
noted 

## 2015-02-03 NOTE — Telephone Encounter (Signed)
Pt assistance forms and letter have been mailed to the pt.

## 2015-02-04 ENCOUNTER — Encounter
Admission: RE | Admit: 2015-02-04 | Discharge: 2015-02-04 | Disposition: A | Discharge status: Home / Self Care | Payer: Medicaid Other | Source: Ambulatory Visit | Attending: General Surgery | Admitting: General Surgery

## 2015-02-04 DIAGNOSIS — Z01812 Encounter for preprocedural laboratory examination: Secondary | ICD-10-CM | POA: Insufficient documentation

## 2015-02-04 HISTORY — DX: Gastro-esophageal reflux disease without esophagitis: K21.9

## 2015-02-04 LAB — HEMOGLOBIN A1C: Hgb A1c MFr Bld: 5 % (ref 4.0–6.0)

## 2015-02-04 LAB — SURGICAL PCR SCREEN
MRSA, PCR: NEGATIVE
STAPHYLOCOCCUS AUREUS: NEGATIVE

## 2015-02-04 NOTE — Pre-Procedure Instructions (Signed)
Faxed request for records of patient stress test and cardiac cath results to [facility] (as per her request) in MR at [facility] Fax [phone]

## 2015-02-04 NOTE — Patient Instructions (Signed)
  Your procedure is scheduled [id number] 02/10/2015 Report to Day Surgery. 2ND FLOOR MEDICAL MALL ENTRANCE To find out your arrival time please call [phone] between 1PM - 3PM on Tuesday 02/09/2015.  Remember: Instructions that are not followed completely may result in serious medical risk, up to and including death, or upon the discretion of your surgeon and anesthesiologist your surgery may need to be rescheduled.    __X__ 1. Do not eat food or drink liquids after midnight. No gum chewing or hard candies.     ___X_ 2. No Alcohol for 24 hours before or after surgery.   ____ 3. Bring all medications with you on the day of surgery if instructed.    __X__ 4. Notify your doctor if there is any change in your medical condition     (cold, fever, infections).     Do not wear jewelry, make-up, hairpins, clips or nail polish.  Do not wear lotions, powders, or perfumes.   Do not shave 48 hours prior to surgery. Men may shave face and neck.  Do not bring valuables to the hospital.    [facility] is not responsible for any belongings or valuables.               Contacts, dentures or bridgework may not be worn into surgery.  Leave your suitcase in the car. After surgery it may be brought to your room.  For patients admitted to the hospital, discharge time is determined by your                treatment team.   Patients discharged the day of surgery will not be allowed to drive home.   Please read over the following fact sheets that you were given:   MRSA Information and Surgical Site Infection Prevention   __X__ Take these medicines the morning of surgery with A SIP OF WATER:    1. dexlansoprazole (DEXILANT) 60 MG capsule  2. FLUoxetine (PROZAC) 20 MG capsule  3. rosuvastatin (CRESTOR) 40 MG tablet  4.  5.  6.  ____ Fleet Enema (as directed)   _X___ Use CHG Soap as directed  __X__ Use inhalers on the day of surgery  ____ Stop metformin 2 days prior to surgery    ____ Take 1/2 of  usual insulin dose the night before surgery and none on the morning of surgery.   ____ Stop Coumadin/Plavix/aspirin on   ____ Stop Anti-inflammatories on    ____ Stop supplements until after surgery.    ____ Bring C-Pap to the hospital.

## 2015-02-10 ENCOUNTER — Ambulatory Visit: Payer: Medicaid Other | Admitting: Anesthesiology

## 2015-02-10 ENCOUNTER — Encounter
Admission: RE | Disposition: A | Discharge status: Home Health | Payer: Self-pay | Source: Ambulatory Visit | Attending: General Surgery

## 2015-02-10 ENCOUNTER — Encounter: Payer: Self-pay | Admitting: *Deleted

## 2015-02-10 ENCOUNTER — Inpatient Hospital Stay
Admission: RE | Admit: 2015-02-10 | Discharge: 2015-02-23 | DRG: 330 | Disposition: A | Discharge status: Home Health | Payer: Medicaid Other | Source: Ambulatory Visit | Attending: General Surgery | Admitting: General Surgery

## 2015-02-10 DIAGNOSIS — C786 Secondary malignant neoplasm of retroperitoneum and peritoneum: Secondary | ICD-10-CM | POA: Diagnosis present

## 2015-02-10 DIAGNOSIS — Z85038 Personal history of other malignant neoplasm of large intestine: Secondary | ICD-10-CM | POA: Diagnosis not present

## 2015-02-10 DIAGNOSIS — R1900 Intra-abdominal and pelvic swelling, mass and lump, unspecified site: Secondary | ICD-10-CM | POA: Diagnosis present

## 2015-02-10 DIAGNOSIS — F17210 Nicotine dependence, cigarettes, uncomplicated: Secondary | ICD-10-CM | POA: Diagnosis present

## 2015-02-10 DIAGNOSIS — C772 Secondary and unspecified malignant neoplasm of intra-abdominal lymph nodes: Secondary | ICD-10-CM | POA: Diagnosis present

## 2015-02-10 DIAGNOSIS — K219 Gastro-esophageal reflux disease without esophagitis: Secondary | ICD-10-CM | POA: Diagnosis present

## 2015-02-10 DIAGNOSIS — C184 Malignant neoplasm of transverse colon: Secondary | ICD-10-CM

## 2015-02-10 DIAGNOSIS — E7800 Pure hypercholesterolemia, unspecified: Secondary | ICD-10-CM | POA: Diagnosis present

## 2015-02-10 DIAGNOSIS — F1290 Cannabis use, unspecified, uncomplicated: Secondary | ICD-10-CM | POA: Diagnosis present

## 2015-02-10 DIAGNOSIS — R000 Tachycardia, unspecified: Secondary | ICD-10-CM | POA: Diagnosis not present

## 2015-02-10 DIAGNOSIS — I10 Essential (primary) hypertension: Secondary | ICD-10-CM | POA: Diagnosis present

## 2015-02-10 DIAGNOSIS — J9811 Atelectasis: Secondary | ICD-10-CM | POA: Diagnosis not present

## 2015-02-10 DIAGNOSIS — K668 Other specified disorders of peritoneum: Secondary | ICD-10-CM | POA: Diagnosis present

## 2015-02-10 DIAGNOSIS — R0602 Shortness of breath: Secondary | ICD-10-CM

## 2015-02-10 DIAGNOSIS — Z5331 Laparoscopic surgical procedure converted to open procedure: Secondary | ICD-10-CM | POA: Diagnosis not present

## 2015-02-10 HISTORY — DX: Anxiety disorder, unspecified: F41.9

## 2015-02-10 HISTORY — PX: LAPAROSCOPIC RIGHT HEMI COLECTOMY: SHX5926

## 2015-02-10 HISTORY — DX: Post-traumatic stress disorder, unspecified: F43.10

## 2015-02-10 HISTORY — DX: Malignant neoplasm of transverse colon: C18.4

## 2015-02-10 LAB — CREATININE, SERUM
Creatinine, Ser: 0.85 mg/dL (ref 0.61–1.24)
GFR calc non Af Amer: >60 mL/min (ref >60)

## 2015-02-10 LAB — CBC
HEMATOCRIT: 43.8 % (ref 40.0–52.0)
HEMOGLOBIN: 15 g/dL (ref 13.0–18.0)
MCH: 32.5 pg (ref 26.0–34.0)
MCHC: 34.1 g/dL (ref 32.0–36.0)
MCV: 95.3 fL (ref 80.0–100.0)
Platelets: 203 10*3/uL (ref 150–440)
RBC: 4.6 MIL/uL (ref 4.40–5.90)
RDW: 14.1 % (ref 11.5–14.5)
WBC: 8.4 10*3/uL (ref 3.8–10.6)

## 2015-02-10 SURGERY — LAPAROSCOPIC RIGHT HEMI COLECTOMY
Anesthesia: General | Laterality: Left | Wound class: Clean Contaminated

## 2015-02-10 MED ORDER — ALBUTEROL SULFATE (2.5 MG/3ML) 0.083% IN NEBU
2.5000 mg | INHALATION_SOLUTION | Freq: Four times a day (QID) | RESPIRATORY_TRACT | Status: DC | PRN
  Administered 2015-02-12: 2.5 mg via RESPIRATORY_TRACT
  Filled 2015-02-10: qty 3

## 2015-02-10 MED ORDER — NALOXONE HCL 0.4 MG/ML IJ SOLN: 0.4000 mg | INTRAMUSCULAR | Status: DC | PRN

## 2015-02-10 MED ORDER — ONDANSETRON HCL 4 MG/2ML IJ SOLN: 4.0000 mg | Freq: Four times a day (QID) | INTRAMUSCULAR | Status: DC | PRN

## 2015-02-10 MED ORDER — LACTATED RINGERS IV SOLN
INTRAVENOUS | Status: DC
  Administered 2015-02-10 (×3): via INTRAVENOUS

## 2015-02-10 MED ORDER — ALVIMOPAN 12 MG PO CAPS
12.0000 mg | ORAL_CAPSULE | Freq: Two times a day (BID) | ORAL | Status: DC
  Administered 2015-02-11 – 2015-02-14 (×5): 12 mg via ORAL
  Filled 2015-02-10 (×5): qty 1

## 2015-02-10 MED ORDER — SODIUM CHLORIDE 0.9 % IV SOLN
10000.0000 ug | INTRAVENOUS | Status: DC | PRN
  Administered 2015-02-10 (×3): 100 ug via INTRAVENOUS

## 2015-02-10 MED ORDER — SUCCINYLCHOLINE CHLORIDE 20 MG/ML IJ SOLN
INTRAMUSCULAR | Status: DC | PRN
  Administered 2015-02-10 (×2): 100 mg via INTRAVENOUS

## 2015-02-10 MED ORDER — LIDOCAINE HCL (PF) 1 % IJ SOLN
INTRAMUSCULAR | Status: AC
  Filled 2015-02-10: qty 30

## 2015-02-10 MED ORDER — BUPIVACAINE HCL (PF) 0.5 % IJ SOLN
INTRAMUSCULAR | Status: AC
  Filled 2015-02-10: qty 30

## 2015-02-10 MED ORDER — FENTANYL CITRATE (PF) 250 MCG/5ML IJ SOLN
INTRAMUSCULAR | Status: DC | PRN
  Administered 2015-02-10 (×3): 50 ug via INTRAVENOUS
  Administered 2015-02-10: 100 ug via INTRAVENOUS
  Administered 2015-02-10 (×2): 50 ug via INTRAVENOUS

## 2015-02-10 MED ORDER — FENTANYL CITRATE (PF) 100 MCG/2ML IJ SOLN
INTRAMUSCULAR | Status: AC
  Administered 2015-02-10: 25 ug via INTRAVENOUS
  Filled 2015-02-10: qty 2

## 2015-02-10 MED ORDER — ONDANSETRON HCL 4 MG/2ML IJ SOLN: 4.0000 mg | Freq: Once | INTRAMUSCULAR | Status: DC | PRN

## 2015-02-10 MED ORDER — ALVIMOPAN 12 MG PO CAPS
ORAL_CAPSULE | ORAL | Status: AC
  Administered 2015-02-10: 18:00:00
  Filled 2015-02-10: qty 1

## 2015-02-10 MED ORDER — DIPHENHYDRAMINE HCL 12.5 MG/5ML PO ELIX: 12.5000 mg | ORAL_SOLUTION | Freq: Four times a day (QID) | ORAL | Status: DC | PRN

## 2015-02-10 MED ORDER — DIPHENHYDRAMINE HCL 50 MG/ML IJ SOLN: 25.0000 mg | Freq: Four times a day (QID) | INTRAMUSCULAR | Status: DC | PRN

## 2015-02-10 MED ORDER — ROCURONIUM BROMIDE 100 MG/10ML IV SOLN
INTRAVENOUS | Status: DC | PRN
  Administered 2015-02-10: 10 mg via INTRAVENOUS
  Administered 2015-02-10: 40 mg via INTRAVENOUS
  Administered 2015-02-10: 10 mg via INTRAVENOUS
  Administered 2015-02-10: 20 mg via INTRAVENOUS
  Administered 2015-02-10: 30 mg via INTRAVENOUS
  Administered 2015-02-10 (×2): 10 mg via INTRAVENOUS

## 2015-02-10 MED ORDER — HYDROMORPHONE HCL 1 MG/ML IJ SOLN
INTRAMUSCULAR | Status: AC
  Administered 2015-02-10: 0.5 mg via INTRAVENOUS
  Filled 2015-02-10: qty 1

## 2015-02-10 MED ORDER — HYDROMORPHONE HCL 1 MG/ML IJ SOLN
INTRAMUSCULAR | Status: AC
Start: 2015-02-10 — End: 2015-02-10
  Administered 2015-02-10: 0.5 mg via INTRAVENOUS
  Filled 2015-02-10: qty 1

## 2015-02-10 MED ORDER — ACETAMINOPHEN 10 MG/ML IV SOLN
INTRAVENOUS | Status: DC | PRN
  Administered 2015-02-10: 1000 mg via INTRAVENOUS

## 2015-02-10 MED ORDER — ERYTHROMYCIN BASE 250 MG PO TABS
1000.0000 mg | ORAL_TABLET | ORAL | Status: DC
  Filled 2015-02-10 (×3): qty 4

## 2015-02-10 MED ORDER — ONDANSETRON HCL 4 MG/2ML IJ SOLN
INTRAMUSCULAR | Status: DC | PRN
  Administered 2015-02-10: 4 mg via INTRAVENOUS

## 2015-02-10 MED ORDER — HYDROMORPHONE HCL 1 MG/ML IJ SOLN
0.2500 mg | INTRAMUSCULAR | Status: DC | PRN
  Administered 2015-02-10 (×4): 0.5 mg via INTRAVENOUS

## 2015-02-10 MED ORDER — DIPHENHYDRAMINE HCL 50 MG/ML IJ SOLN: 12.5000 mg | Freq: Four times a day (QID) | INTRAMUSCULAR | Status: DC | PRN

## 2015-02-10 MED ORDER — FENTANYL CITRATE (PF) 100 MCG/2ML IJ SOLN
25.0000 ug | INTRAMUSCULAR | Status: DC | PRN
  Administered 2015-02-10 (×4): 25 ug via INTRAVENOUS

## 2015-02-10 MED ORDER — ENOXAPARIN SODIUM 40 MG/0.4ML ~~LOC~~ SOLN
40.0000 mg | SUBCUTANEOUS | Status: DC
  Administered 2015-02-11 – 2015-02-23 (×13): 40 mg via SUBCUTANEOUS
  Filled 2015-02-10 (×13): qty 0.4

## 2015-02-10 MED ORDER — DEXAMETHASONE SODIUM PHOSPHATE 10 MG/ML IJ SOLN
INTRAMUSCULAR | Status: DC | PRN
  Administered 2015-02-10: 10 mg via INTRAVENOUS

## 2015-02-10 MED ORDER — CHLORHEXIDINE GLUCONATE CLOTH 2 % EX PADS: 6.0000 | MEDICATED_PAD | Freq: Once | CUTANEOUS | Status: DC

## 2015-02-10 MED ORDER — PROPOFOL 10 MG/ML IV BOLUS
INTRAVENOUS | Status: DC | PRN
  Administered 2015-02-10: 200 mg via INTRAVENOUS
  Administered 2015-02-10: 100 mg via INTRAVENOUS

## 2015-02-10 MED ORDER — LIDOCAINE HCL 1 % IJ SOLN
INTRAMUSCULAR | Status: DC | PRN
  Administered 2015-02-10: 6 mL

## 2015-02-10 MED ORDER — ALVIMOPAN 12 MG PO CAPS
12.0000 mg | ORAL_CAPSULE | Freq: Once | ORAL | Status: AC
  Administered 2015-02-10: 12 mg via ORAL

## 2015-02-10 MED ORDER — DEXTROSE 5 % IV SOLN
2.0000 g | INTRAVENOUS | Status: DC | PRN
  Administered 2015-02-10: 2 g via INTRAVENOUS

## 2015-02-10 MED ORDER — NEOMYCIN SULFATE 500 MG PO TABS
1000.0000 mg | ORAL_TABLET | ORAL | Status: DC
  Filled 2015-02-10 (×3): qty 2

## 2015-02-10 MED ORDER — LIDOCAINE HCL (CARDIAC) 20 MG/ML IV SOLN
INTRAVENOUS | Status: DC | PRN
  Administered 2015-02-10: 80 mg via INTRAVENOUS

## 2015-02-10 MED ORDER — SUGAMMADEX SODIUM 200 MG/2ML IV SOLN
INTRAVENOUS | Status: DC | PRN
  Administered 2015-02-10: 300 mg via INTRAVENOUS

## 2015-02-10 MED ORDER — LIDOCAINE HCL 2 % EX GEL
CUTANEOUS | Status: DC | PRN
  Administered 2015-02-10: 1 via TOPICAL

## 2015-02-10 MED ORDER — ONDANSETRON HCL 4 MG/2ML IJ SOLN
4.0000 mg | Freq: Four times a day (QID) | INTRAMUSCULAR | Status: DC | PRN
  Administered 2015-02-19: 4 mg via INTRAVENOUS
  Filled 2015-02-10: qty 2

## 2015-02-10 MED ORDER — MIDAZOLAM HCL 5 MG/5ML IJ SOLN
INTRAMUSCULAR | Status: DC | PRN
  Administered 2015-02-10: 2 mg via INTRAVENOUS

## 2015-02-10 MED ORDER — DIPHENHYDRAMINE HCL 25 MG PO CAPS: 25.0000 mg | ORAL_CAPSULE | Freq: Four times a day (QID) | ORAL | Status: DC | PRN

## 2015-02-10 MED ORDER — ALBUTEROL SULFATE HFA 108 (90 BASE) MCG/ACT IN AERS
INHALATION_SPRAY | RESPIRATORY_TRACT | Status: DC | PRN
  Administered 2015-02-10: 4 via RESPIRATORY_TRACT

## 2015-02-10 MED ORDER — CEFOTETAN DISODIUM 2 G IJ SOLR
2.0000 g | INTRAMUSCULAR | Status: AC
  Administered 2015-02-10: 2 g via INTRAVENOUS
  Filled 2015-02-10: qty 2

## 2015-02-10 MED ORDER — PEG 3350-KCL-NA BICARB-NACL 420 G PO SOLR
4000.0000 mL | Freq: Once | ORAL | Status: DC
  Filled 2015-02-10: qty 4000

## 2015-02-10 MED ORDER — ONDANSETRON HCL 4 MG PO TABS: 4.0000 mg | ORAL_TABLET | Freq: Four times a day (QID) | ORAL | Status: DC | PRN

## 2015-02-10 MED ORDER — LACTATED RINGERS IV SOLN
INTRAVENOUS | Status: DC
  Administered 2015-02-10 – 2015-02-20 (×25): via INTRAVENOUS

## 2015-02-10 MED ORDER — ENOXAPARIN SODIUM 40 MG/0.4ML ~~LOC~~ SOLN
40.0000 mg | Freq: Once | SUBCUTANEOUS | Status: AC
  Administered 2015-02-10: 40 mg via SUBCUTANEOUS
  Filled 2015-02-10: qty 0.4

## 2015-02-10 MED ORDER — MORPHINE SULFATE 2 MG/ML IV SOLN
INTRAVENOUS | Status: DC
  Administered 2015-02-10 – 2015-02-11 (×2): via INTRAVENOUS
  Administered 2015-02-11: 6 mg via INTRAVENOUS
  Administered 2015-02-11: 7.5 mg via INTRAVENOUS
  Administered 2015-02-11: 16.5 mg via INTRAVENOUS
  Administered 2015-02-11: 19.5 mg via INTRAVENOUS
  Administered 2015-02-11: 9 mg via INTRAVENOUS
  Administered 2015-02-12: 2 mg via INTRAVENOUS
  Administered 2015-02-12: 7.5 mg via INTRAVENOUS
  Administered 2015-02-12: 2 mg via INTRAVENOUS
  Administered 2015-02-12: 12 mg via INTRAVENOUS
  Administered 2015-02-12: 10.5 mg via INTRAVENOUS
  Administered 2015-02-12: 2 mg via INTRAVENOUS
  Administered 2015-02-12: 10.5 mg via INTRAVENOUS
  Administered 2015-02-13: 12 mg via INTRAVENOUS
  Administered 2015-02-13: 8.77 mg via INTRAVENOUS
  Administered 2015-02-13: 2 mg via INTRAVENOUS
  Administered 2015-02-13: 9 mg via INTRAVENOUS
  Administered 2015-02-13: 10.5 mg via INTRAVENOUS
  Administered 2015-02-13: 7.5 mg via INTRAVENOUS
  Administered 2015-02-14: 19 mL via INTRAVENOUS
  Administered 2015-02-14: 2 mL via INTRAVENOUS
  Administered 2015-02-14: 1.5 mg via INTRAVENOUS
  Administered 2015-02-14: 2 mL via INTRAVENOUS
  Administered 2015-02-14: 4.5 mg via INTRAVENOUS
  Administered 2015-02-14: 3 mg via INTRAVENOUS
  Administered 2015-02-14: 9 mg via INTRAVENOUS
  Administered 2015-02-15: 12:00:00 via INTRAVENOUS
  Administered 2015-02-15: 6 mL via INTRAVENOUS
  Administered 2015-02-15: 10.5 mg via INTRAVENOUS
  Administered 2015-02-15: 7.5 mg via INTRAVENOUS
  Administered 2015-02-15 (×2): 10.5 mg via INTRAVENOUS
  Administered 2015-02-16: 2 mg via INTRAVENOUS
  Administered 2015-02-16: 7.5 mg via INTRAVENOUS
  Administered 2015-02-16: 4.5 mg via INTRAVENOUS
  Administered 2015-02-16: 15 mg via INTRAVENOUS
  Administered 2015-02-16: 10.5 mg via INTRAVENOUS
  Administered 2015-02-16: 7.5 mg via INTRAVENOUS
  Administered 2015-02-17: 10.5 mg via INTRAVENOUS
  Administered 2015-02-17: 6 mg via INTRAVENOUS
  Administered 2015-02-17: 9 mg via INTRAVENOUS
  Administered 2015-02-17: 6 mg via INTRAVENOUS
  Administered 2015-02-17: 13.5 mg via INTRAVENOUS
  Administered 2015-02-17: 9 mg via INTRAVENOUS
  Administered 2015-02-18: 7.04 mg via INTRAVENOUS
  Administered 2015-02-18: 12 mg via INTRAVENOUS
  Filled 2015-02-10 (×11): qty 25

## 2015-02-10 MED ORDER — ACETAMINOPHEN 10 MG/ML IV SOLN
INTRAVENOUS | Status: AC
  Filled 2015-02-10: qty 100

## 2015-02-10 MED ORDER — METOPROLOL TARTRATE 1 MG/ML IV SOLN
INTRAVENOUS | Status: DC | PRN
  Administered 2015-02-10: 2 mg via INTRAVENOUS

## 2015-02-10 MED ORDER — SODIUM CHLORIDE 0.9 % IJ SOLN: 9.0000 mL | INTRAMUSCULAR | Status: DC | PRN

## 2015-02-10 SURGICAL SUPPLY — 67 items
ADHESIVE MASTISOL STRL (MISCELLANEOUS) ×3 IMPLANT
BLADE SURG SZ10 CARB STEEL (BLADE) ×3 IMPLANT
CANISTER SUCT 1200ML W/VALVE (MISCELLANEOUS) ×3 IMPLANT
CATH TRAY 16F METER LATEX (MISCELLANEOUS) ×3 IMPLANT
CHLORAPREP W/TINT 26ML (MISCELLANEOUS) ×3 IMPLANT
CLEANER CAUTERY TIP 5X5 PAD (MISCELLANEOUS) ×2 IMPLANT
CLIP TI LARGE 6 (CLIP) IMPLANT
CLIP TI MEDIUM 6 (CLIP) IMPLANT
CLOSURE WOUND 1/2 X4 (GAUZE/BANDAGES/DRESSINGS) ×1
CNTNR SPEC 2.5X3XGRAD LEK (MISCELLANEOUS) ×1
CONT SPEC 4OZ STER OR WHT (MISCELLANEOUS) ×2
CONTAINER SPEC 2.5X3XGRAD LEK (MISCELLANEOUS) ×1 IMPLANT
DRAPE LEGGINS SURG 28X43 STRL (DRAPES) ×3 IMPLANT
DRSG OPSITE POSTOP 4X12 (GAUZE/BANDAGES/DRESSINGS) ×3 IMPLANT
DRSG TELFA 3X8 NADH (GAUZE/BANDAGES/DRESSINGS) ×3 IMPLANT
ELECT BLADE 6.5 EXT (BLADE) ×6 IMPLANT
GAUZE SPONGE NON-WVN 2X2 STRL (MISCELLANEOUS) ×1 IMPLANT
GELPORT LAPAROSCOPIC (MISCELLANEOUS) ×3 IMPLANT
GLOVE BIO SURGEON STRL SZ7.5 (GLOVE) ×12 IMPLANT
GOWN STRL REUS W/ TWL LRG LVL3 (GOWN DISPOSABLE) ×6 IMPLANT
GOWN STRL REUS W/TWL LRG LVL3 (GOWN DISPOSABLE) ×12
HANDLE YANKAUER SUCT BULB TIP (MISCELLANEOUS) ×3 IMPLANT
IRRIGATION STRYKERFLOW (MISCELLANEOUS) IMPLANT
IRRIGATOR STRYKERFLOW (MISCELLANEOUS)
IV NS 1000ML (IV SOLUTION) ×2
IV NS 1000ML BAXH (IV SOLUTION) ×1 IMPLANT
KIT RM TURNOVER STRD PROC AR (KITS) ×3 IMPLANT
LABEL OR SOLS (LABEL) ×3 IMPLANT
LIGASURE BLUNT 5MM 37CM (INSTRUMENTS) ×3 IMPLANT
LIGASURE IMPACT 36 18CM CVD LR (INSTRUMENTS) ×3 IMPLANT
NEEDLE HYPO 25X1 1.5 SAFETY (NEEDLE) ×3 IMPLANT
NEEDLE VERESS 14GA 120MM (NEEDLE) ×3 IMPLANT
NS IRRIG 1000ML POUR BTL (IV SOLUTION) ×3 IMPLANT
PACK COLON CLEAN CLOSURE (MISCELLANEOUS) ×3 IMPLANT
PACK LAP CHOLECYSTECTOMY (MISCELLANEOUS) ×3 IMPLANT
PAD CLEANER CAUTERY TIP 5X5 (MISCELLANEOUS) ×4
PAD GROUND ADULT SPLIT (MISCELLANEOUS) ×3 IMPLANT
PAD PREP 24X41 OB/GYN DISP (PERSONAL CARE ITEMS) IMPLANT
PENCIL ELECTRO HAND CTR (MISCELLANEOUS) ×3 IMPLANT
RELOAD PROXIMATE 75MM BLUE (ENDOMECHANICALS) ×6 IMPLANT
RETRACTOR WOUND ALXS 18CM MED (MISCELLANEOUS) ×1 IMPLANT
RTRCTR WOUND ALEXIS O 18CM MED (MISCELLANEOUS) ×3
SCISSORS METZENBAUM CVD 33 (INSTRUMENTS) IMPLANT
SLEEVE ENDOPATH XCEL 5M (ENDOMECHANICALS) ×6 IMPLANT
SOL PREP PVP 2OZ (MISCELLANEOUS) ×3
SOLUTION PREP PVP 2OZ (MISCELLANEOUS) ×1 IMPLANT
SPONGE LAP 18X18 5 PK (GAUZE/BANDAGES/DRESSINGS) ×3 IMPLANT
SPONGE VERSALON 2X2 STRL (MISCELLANEOUS) ×2
STAPLER GUN LINEAR PROX 60 (STAPLE) ×3 IMPLANT
STAPLER PROXIMATE 75MM BLUE (STAPLE) ×3 IMPLANT
STAPLER SKIN PROX 35W (STAPLE) IMPLANT
STRIP CLOSURE SKIN 1/2X4 (GAUZE/BANDAGES/DRESSINGS) ×2 IMPLANT
SURGILUBE 2OZ TUBE FLIPTOP (MISCELLANEOUS) ×3 IMPLANT
SUT MNCRL 4-0 (SUTURE) ×2
SUT MNCRL 4-0 27XMFL (SUTURE) ×1
SUT SILK 0 (SUTURE) ×2
SUT SILK 0 30XBRD TIE 6 (SUTURE) ×1 IMPLANT
SUT SILK 0 CT 1 30 (SUTURE) ×3 IMPLANT
SUT SILK 3-0 (SUTURE) ×2
SUT SILK 3-0 SH-1 18XCR BRD (SUTURE) ×1
SUT VIC AB 2-0 CT1 27 (SUTURE) ×2
SUT VIC AB 2-0 CT1 TAPERPNT 27 (SUTURE) ×1 IMPLANT
SUTURE MNCRL 4-0 27XMF (SUTURE) ×1 IMPLANT
SUTURE SILK 3-0 SH-1 18XCR BRD (SUTURE) ×1 IMPLANT
TROCAR XCEL NON-BLD 11X100MML (ENDOMECHANICALS) ×3 IMPLANT
TROCAR XCEL NON-BLD 5MMX100MML (ENDOMECHANICALS) ×3 IMPLANT
TUBING INSUFFLATOR HEATED (MISCELLANEOUS) ×3 IMPLANT

## 2015-02-10 NOTE — Anesthesia Procedure Notes (Signed)
Procedure Name: Intubation Date/Time: 02/10/2015 1:00 PM Performed by: Delaney Meigs Pre-anesthesia Checklist: Patient identified, Emergency Drugs available, Suction available, Patient being monitored and Timeout performed Patient Re-evaluated:Patient Re-evaluated prior to inductionOxygen Delivery Method: Circle system utilized Preoxygenation: Pre-oxygenation with 100% oxygen Intubation Type: IV induction Ventilation: Mask ventilation without difficulty Laryngoscope Size: 3 and Miller Grade View: Grade IV Tube type: Oral Tube size: 7.5 mm Number of attempts: 3 Airway Equipment and Method: Stylet Placement Confirmation: ETT inserted through vocal cords under direct vision,  positive ETCO2 and breath sounds checked- equal and bilateral Secured at: 22 cm Tube secured with: Tape Dental Injury: Teeth and Oropharynx as per pre-operative assessment  Difficulty Due To: Difficulty was unanticipated Future Recommendations: Recommend- induction with short-acting agent, and alternative techniques readily available Comments: Attempted intubation with MAC 3 and 4 no visualization third attempt with Sabra Heck 3 view was grade 2.

## 2015-02-10 NOTE — Interval H&P Note (Signed)
History and Physical Interval Note:  02/10/2015 12:26 PM  Erik [name]  has presented today for surgery, with the diagnosis of tubular adenoma of colon  The various methods of treatment have been discussed with the patient and family. After consideration of risks, benefits and other options for treatment, the patient has consented to  Procedure(s): LAPAROSCOPIC RIGHT HEMI COLECTOMY (Left) as a surgical intervention .  The patient's history has been reviewed, patient examined, no change in status, stable for surgery.  I have reviewed the patient's chart and labs.  Questions were answered to the patient's satisfaction.     [name]

## 2015-02-10 NOTE — H&P (View-Only) (Signed)
Patient ID: Erik [name], male   DOB: 03/11/1955, 60 y.o.   MRN: [id number]  CC: COLON MASS  HPI TOY SNELGROVE is a 60 y.o. male  Presenting to clinic for evaluation of a recently discovered colonic mass. Patient states that he was having a screening colonoscopy for which this mass was found. He denies any blood per rectum or changes in stool caliber. He's been having normal appetite and normal bowel habits. He was complaining of heartburn prior to his upper and lower endoscopy and was found to have gastritis. He does state that since the biopsy he has had some abdominal discomfort in his upper abdomen to the left of midline but it has been vague and nonspecific. Prior to his colonoscopy he is in his usual state of health and he denies any fevers, chills, nausea, vomiting, diarrhea, constipation, chest pain, shortness of breath. The biopsy of the mass came back as a tubular adenoma with high-grade dysplasia.  HPI  Past Medical History  Diagnosis Date  . Head trauma 2001    closed head injury; coma for 4 weeks  . Depression   . Abnormal stress echocardiogram   . Hypercholesterolemia   . Hypertension   . Alcohol abuse     Heavy Use up until 2010    Past Surgical History  Procedure Laterality Date  . Kidney surgery      >30 years ago  . Cardiac cath    . Craniotomy  2001  . Head injury surgery    . Colonoscopy  2011    Dr. Oneida Alar: multiple adenomas and hyperplastic polyps  . Colonoscopy with propofol N/A 01/12/2015    Procedure: COLONOSCOPY WITH PROPOFOL;  Surgeon: Danie Binder, MD;  [facility];  Service: Endoscopy;  Laterality: N/A;  1030  . Esophagogastroduodenoscopy (egd) with propofol N/A 01/12/2015    Procedure: ESOPHAGOGASTRODUODENOSCOPY (EGD) WITH PROPOFOL;  Surgeon: Danie Binder, MD;  [facility]: [facility];  Service: Endoscopy;  Laterality: N/A;  . Hernia repair Right 2012    Inguinal- Forestine Na    Family History  Problem Relation Age of Onset  .  Ataxia Neg Hx   . Chorea Neg Hx   . Dementia Neg Hx   . Mental retardation Neg Hx   . Migraines Neg Hx   . Multiple sclerosis Neg Hx   . Neurofibromatosis Neg Hx   . Neuropathy Neg Hx   . Parkinsonism Neg Hx   . Seizures Neg Hx   . Stroke Neg Hx   . Colon cancer Neg Hx   . Pulmonary embolism Mother   . Deep vein thrombosis Mother   . Heart disease Father   . Cancer Father     Leukemia    Social History Social History  Substance Use Topics  . Smoking status: Current Every Day Smoker -- 0.25 packs/day    Types: Cigarettes  . Smokeless tobacco: Never Used     Comment: patient is aware he needs to quit  . Alcohol Use: 0.0 oz/week    0 Standard drinks or equivalent per week     Comment: beer occ, history of ETOH abuse in remote past.     No Known Allergies  Current Outpatient Prescriptions  Medication Sig Dispense Refill  . albuterol (PROVENTIL HFA;VENTOLIN HFA) 108 (90 BASE) MCG/ACT inhaler Inhale 2 puffs into the lungs every 6 (six) hours as needed for wheezing or shortness of breath. 1 Inhaler 1  . amitriptyline (ELAVIL) 25 MG tablet TAKE ONE  TABLET BY MOUTH AT BEDTIME. (Patient taking differently: TAKE ONE TABLET BY MOUTH AT BEDTIME. prn) 30 tablet 2  . dexlansoprazole (DEXILANT) 60 MG capsule Take 60 mg by mouth daily.    Marland Kitchen FLUoxetine (PROZAC) 20 MG capsule Take 20 mg by mouth daily.    . rosuvastatin (CRESTOR) 40 MG tablet Take 1 tablet (40 mg total) by mouth daily. 90 tablet 1  . sildenafil (VIAGRA) 100 MG tablet Take 0.5-1 tablets (50-100 mg total) by mouth daily as needed for erectile dysfunction. 10 tablet 1  . bisacodyl (DULCOLAX) 5 MG EC tablet Take 4 tablets (20 mg total) by mouth once. 4 tablet 0  . polyethylene glycol powder (GLYCOLAX/MIRALAX) powder Take 255 g by mouth once. 255 g 0   No current facility-administered medications for this visit.     Review of Systems A  Multi-point review of systems was asked and was negative except for the findings  documented in the history of present illness  Physical Exam Blood pressure 123/81, pulse 101, temperature 98.3 F (36.8 C), temperature source Oral, height 6\' 2"  (1.88 m), weight 105.416 kg (232 lb 6.4 oz). CONSTITUTIONAL:  No acute distress. EYES: Pupils are equal, round, and reactive to light, Sclera are non-icteric. EARS, NOSE, MOUTH AND THROAT: The oropharynx is clear. The oral mucosa is pink and moist. Hearing is intact to voice. LYMPH NODES:  Lymph nodes in the neck are normal. RESPIRATORY:  Lungs are clear. There is normal respiratory effort, with equal breath sounds bilaterally, and without pathologic use of accessory muscles. CARDIOVASCULAR: Heart is regular without murmurs, gallops, or rubs. GI: The abdomen is soft, nontender, and nondistended. There are no palpable masses. There is no hepatosplenomegaly. There are normal bowel sounds in all quadrants. Well-healed right inguinal hernia incision. GU: Rectal deferred.   MUSCULOSKELETAL: Normal muscle strength and tone. No cyanosis or edema.   SKIN: Turgor is good and there are no pathologic skin lesions or ulcers. NEUROLOGIC: Motor and sensation is grossly normal. Cranial nerves are grossly intact. PSYCH:  Oriented to person, place and time. Affect is normal.  Data Reviewed  I independently reviewed his colonoscopy, CT, labs. Colonoscopy does show a large mass that is labeled as being at the splenic flexure. It is not bleeding and not currently obstructing. CT findings show what appears to be an apple core lesion of the mid transverse colon. Labs, chest x-ray, EKG are all within normal limits with the exception of an elevated CEA I have personally reviewed the patient's imaging, laboratory findings and medical records.    Assessment     transverse colon tubovillous adenoma with high-grade dysplasia.     Plan     had a long conversation with the patient about the finding of this tubovillous adenoma. Discussed that based on the  appearance of the colonoscopy and the CT as well as the elevated CEA that this may actually represent a colon cancer rather than a tubovillous adenoma.  Due to the size of the lesion seen on colonoscopy this result is likely just of the area that was sampled not indicative of the entire tumor. Discussed that we would treat him as if he had colon cancer at this point. The plan would be for an attempted laparoscopic hemicolectomy. Had a long conversation about right versus left extended hemicolectomy. We discussed in detail that the actual location of the tumor would dictate which surgery he needed based on its blood supply. The risks, benefits, alternatives of a laparoscopic hemicolectomy were discussed in  detail. The specific risks were of injury to an unassociated segment of bowel, inability to complete a laparoscopic, need for an ostomy either indoor loop, possible need for a prolonged hospital stay. Patient voiced understanding and wishes to proceed. We will complete his anesthesia workup as an outpatient and plan for surgery on 02/10/2015.      Time spent with the patient was 60 minutes, with more than 50% of the time spent in face-to-face education, counseling and care coordination.     [name], MD FACS General Surgeon 01/21/2015, 6:55 PM
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
## 2015-02-10 NOTE — Anesthesia Postprocedure Evaluation (Signed)
Anesthesia Post Note  Patient: Erik [name]  Procedure(s) Performed: Procedure(s) (LRB): LAPAROSCOPIC THEN OPEN RIGHT HEMI COLECTOMY (Left)  Patient location during evaluation: PACU Anesthesia Type: General Level of consciousness: awake Pain management: pain level controlled Vital Signs Assessment: post-procedure vital signs reviewed and stable Respiratory status: spontaneous breathing Cardiovascular status: blood pressure returned to baseline Anesthetic complications: no    Last Vitals:  Filed Vitals:   02/10/15 1826 02/10/15 1839  BP:  139/79  Pulse:  92  Temp:  36.7 C  Resp: 13 17    Last Pain:  Filed Vitals:   02/10/15 2017  PainSc: [place]

## 2015-02-10 NOTE — Transfer of Care (Signed)
Immediate Anesthesia Transfer of Care Note  Patient: Erik [name]  Procedure(s) Performed: Procedure(s): LAPAROSCOPIC THEN OPEN RIGHT HEMI COLECTOMY (Left)  Patient Location: PACU  Anesthesia Type:General  Level of Consciousness: sedated  Airway & Oxygen Therapy: Patient Spontanous Breathing and Patient connected to face mask oxygen  Post-op Assessment: Report given to RN and Post -op Vital signs reviewed and stable  Post vital signs: Reviewed and stable  Last Vitals:  Filed Vitals:   02/10/15 1105 02/10/15 1638  BP: 138/92 167/119  Pulse: 105 104  Temp: 37.6 C 36.8 C  Resp: 18 18    Complications: No apparent anesthesia complications

## 2015-02-10 NOTE — Anesthesia Preprocedure Evaluation (Signed)
Anesthesia Evaluation  Patient identified by MRN, date of birth, ID band Patient awake    Reviewed: Allergy & Precautions, NPO status , Patient's Chart, lab work & pertinent test results  History of Anesthesia Complications Negative for: history of anesthetic complications  Airway Mallampati: II       Dental no notable dental hx.    Pulmonary COPD, Current Smoker,     + decreased breath sounds      Cardiovascular hypertension, Pt. on medications + CAD   Rhythm:Regular Rate:Normal     Neuro/Psych Depression    GI/Hepatic Neg liver ROS, GERD  ,  Endo/Other  negative endocrine ROS  Renal/GU negative Renal ROS     Musculoskeletal   Abdominal Normal abdominal exam  (+)   Peds  Hematology negative hematology ROS (+)   Anesthesia Other Findings   Reproductive/Obstetrics                             Anesthesia Physical Anesthesia Plan  ASA: II  Anesthesia Plan: General   Post-op Pain Management:    Induction: Intravenous  Airway Management Planned: Oral ETT  Additional Equipment:   Intra-op Plan:   Post-operative Plan: Extubation in OR  Informed Consent: I have reviewed the patients History and Physical, chart, labs and discussed the procedure including the risks, benefits and alternatives for the proposed anesthesia with the patient or authorized representative who has indicated his/her understanding and acceptance.     Plan Discussed with: CRNA  Anesthesia Plan Comments:         Anesthesia Quick Evaluation

## 2015-02-10 NOTE — Brief Op Note (Signed)
02/10/2015  4:35 PM  PATIENT:  Erik [name]  60 y.o. male  PRE-OPERATIVE DIAGNOSIS:  tubular adenoma of colon  POST-OPERATIVE DIAGNOSIS:  Colon cancer  PROCEDURE:  Procedure(s): LAPAROSCOPIC THEN OPEN RIGHT HEMI COLECTOMY (Left)  SURGEON:  Surgeon(s) and Role:    * [name], MD - Primary    * Nestor Lewandowsky, MD - Assisting  PHYSICIAN ASSISTANT:   ASSISTANTS: Dr. Genevive Bi   ANESTHESIA:   general  EBL:  Total I/O In: 2100 [I.V.:2000; IV Piggyback:100] Out: 235 [Urine:235]  BLOOD ADMINISTERED:none  DRAINS: none   LOCAL MEDICATIONS USED:  MARCAINE    and XYLOCAINE   SPECIMEN:  Source of Specimen:  Extended right hemi-colon  DISPOSITION OF SPECIMEN:  PATHOLOGY  COUNTS:  YES  TOURNIQUET:  * No tourniquets in log *  DICTATION: .Dragon Dictation  PLAN OF CARE: Admit to inpatient   PATIENT DISPOSITION:  PACU - hemodynamically stable.   Delay start of Pharmacological VTE agent (>24hrs) due to surgical blood loss or risk of bleeding: no

## 2015-02-11 ENCOUNTER — Encounter: Payer: Self-pay | Admitting: General Surgery

## 2015-02-11 LAB — BASIC METABOLIC PANEL
ANION GAP: 8 (ref 5–15)
BUN: 8 mg/dL (ref 6–20)
CHLORIDE: 99 mmol/L — AB (ref 101–111)
CO2: 29 mmol/L (ref 22–32)
Calcium: 8.2 mg/dL — ABNORMAL LOW (ref 8.9–10.3)
Creatinine, Ser: 0.76 mg/dL (ref 0.61–1.24)
GFR calc Af Amer: >60 mL/min (ref >60)
GLUCOSE: 134 mg/dL — AB (ref 65–99)
POTASSIUM: 4.2 mmol/L (ref 3.5–5.1)
Sodium: 136 mmol/L (ref 135–145)

## 2015-02-11 LAB — CBC
HEMATOCRIT: 38.8 % — AB (ref 40.0–52.0)
HEMOGLOBIN: 13.3 g/dL (ref 13.0–18.0)
MCH: 32.4 pg (ref 26.0–34.0)
MCHC: 34.4 g/dL (ref 32.0–36.0)
MCV: 94 fL (ref 80.0–100.0)
Platelets: 199 10*3/uL (ref 150–440)
RBC: 4.12 MIL/uL — ABNORMAL LOW (ref 4.40–5.90)
RDW: 14.3 % (ref 11.5–14.5)
WBC: 8 10*3/uL (ref 3.8–10.6)

## 2015-02-11 LAB — MAGNESIUM: Magnesium: 1.5 mg/dL — ABNORMAL LOW (ref 1.7–2.4)

## 2015-02-11 LAB — PHOSPHORUS: PHOSPHORUS: 4.4 mg/dL (ref 2.5–4.6)

## 2015-02-11 MED ORDER — ALBUTEROL SULFATE (2.5 MG/3ML) 0.083% IN NEBU
2.5000 mg | INHALATION_SOLUTION | Freq: Two times a day (BID) | RESPIRATORY_TRACT | Status: DC
  Administered 2015-02-11 – 2015-02-20 (×13): 2.5 mg via RESPIRATORY_TRACT
  Filled 2015-02-11 (×18): qty 3

## 2015-02-11 MED ORDER — ALBUTEROL SULFATE HFA 108 (90 BASE) MCG/ACT IN AERS: 2.0000 | INHALATION_SPRAY | Freq: Two times a day (BID) | RESPIRATORY_TRACT | Status: DC

## 2015-02-11 MED ORDER — MENTHOL 3 MG MT LOZG
1.0000 | LOZENGE | OROMUCOSAL | Status: DC | PRN
  Administered 2015-02-12: 3 mg via ORAL
  Filled 2015-02-11: qty 9

## 2015-02-11 NOTE — Addendum Note (Signed)
Addendum  created 02/11/15 1105 by Doreen Salvage, CRNA   Modules edited: Charges VN

## 2015-02-11 NOTE — Progress Notes (Signed)
1 Day Post-Op   Subjective:  60 year old male 1 day status post laparoscopic converted to open extended right hemicolectomy. Patient reports he has had a fair amount of pain since surgery but this is relieved with the use of his PCA. He has not had any nausea or vomiting but has continued to have an NG tube in place with decompression. Patient desires to get out of bed. He denies any flatus or bowel movement.  Vital signs in last 24 hours: Temp:  [97.3 F (36.3 C)-98.6 F (37 C)] 97.3 F (36.3 C) (01/05 1335) Pulse Rate:  [78-104] 103 (01/05 1335) Resp:  [11-18] 18 (01/05 1335) BP: (98-182)/(56-119) 101/64 mmHg (01/05 1335) SpO2:  [93 %-100 %] 96 % (01/05 1335) FiO2 (%):  [94 %-95 %] 95 % (01/05 0140) Weight:  [104.327 kg (230 lb)] 104.327 kg (230 lb) (01/04 1839)    Intake/Output from previous day: 01/04 0701 - 01/05 0700 In: 2953 [I.V.:2853; IV Piggyback:100] Out: [id number] [Urine:1035; Emesis/NG output:5; Blood:50]  Gen.: No acute distress Chest: Clear to auscultation Heart: Sinus tachycardic GI: Abdomen soft, appropriately tender to palpation at his incision sites, staple line intact with minimal sanguinous drainage on the lower aspect of his honeycomb dressing. Noticed peritonitis, no spreading erythema or purulent drainage.  Lab Results:  CBC  Recent Labs  02/10/15 1913 02/11/15 0512  WBC 8.4 8.0  HGB 15.0 13.3  HCT 43.8 38.8*  PLT 203 199   CMP     Component Value Date/Time   NA 136 02/11/2015 0512   K 4.2 02/11/2015 0512   CL 99* 02/11/2015 0512   CO2 29 02/11/2015 0512   GLUCOSE 134* 02/11/2015 0512   BUN 8 02/11/2015 0512   CREATININE 0.76 02/11/2015 0512   CREATININE 0.86 12/16/2014 0844   CALCIUM 8.2* 02/11/2015 0512   PROT 6.5 01/12/2015 1310   ALBUMIN 3.8 01/12/2015 1310   AST 26 01/12/2015 1310   ALT 20 01/12/2015 1310   ALKPHOS 51 01/12/2015 1310   BILITOT 0.5 01/12/2015 1310   GFRNONAA >60 02/11/2015 0512   GFRNONAA >89 12/16/2014 0844   GFRAA  >60 02/11/2015 0512   GFRAA >89 12/16/2014 0844   PT/INR No results for input(s): LABPROT, INR in the last 72 hours.  Studies/Results: No results found.  Assessment/Plan: 60 year old male 1 day status post extended right colectomy colectomy. Discussed with patient today that the operative findings are most consistent with a colon cancer. Patient voiced understanding. Plan to continue NG tube until evidence of return of bowel function. Discussed patient the need to get out of bed to chair today. We will keep Foley for 1 more day with plans of removing it tomorrow morning.   [name], MD FACS General Surgeon  02/11/2015

## 2015-02-12 ENCOUNTER — Inpatient Hospital Stay: Payer: Medicaid Other

## 2015-02-12 LAB — BLOOD GAS, ARTERIAL
Acid-Base Excess: 9.7 mmol/L — ABNORMAL HIGH (ref 0.0–3.0)
BICARBONATE: 34.9 meq/L — AB (ref 21.0–28.0)
FIO2: 0.28
O2 SAT: 93.4 %
PATIENT TEMPERATURE: 37
PO2 ART: 64 mmHg — AB (ref 83.0–108.0)
pCO2 arterial: 48 mmHg (ref 32.0–48.0)
pH, Arterial: 7.47 — ABNORMAL HIGH (ref 7.350–7.450)

## 2015-02-12 NOTE — Progress Notes (Signed)
ABG & EKG complete

## 2015-02-12 NOTE — Progress Notes (Signed)
Dr. Burt Knack at bedside to see pt.

## 2015-02-12 NOTE — Progress Notes (Signed)
2 Days Post-Op   Subjective:  60 year old male 2 days status post laparoscopic converted to open right hemicolectomy. Patient reports he had an okay night with episodes of coughing and pain. He states the pain has been resolved with the PCA usage. NG tube has remained in place with nearly 1 L of output over the last 24 hours. Patient was able to get out of bed yesterday and up to a chair. Patient states he is ready to have his Foley removed.  Vital signs in last 24 hours: Temp:  [97.2 F (36.2 C)-97.6 F (36.4 C)] 97.6 F (36.4 C) (01/06 0436) Pulse Rate:  [91-107] 101 (01/06 0436) Resp:  [10-18] 11 (01/06 0814) BP: (101-141)/(62-82) 141/72 mmHg (01/06 0436) SpO2:  [92 %-97 %] 93 % (01/06 0814)    Intake/Output from previous day: 01/05 0701 - 01/06 0700 In: 4245.1 [I.V.:4245.1] Out: 1730 [Urine:800; Emesis/NG output:930]  Visible exam: Gen.: No acute distress Chest: Clear to auscultation Heart: Sinus tachycardic GI: Abdomen is soft, appropriately tender to palpation in the midline, dressings in place the midline and laparoscopic incision sites without any evidence of spreading erythema or purulent drainage.  Lab Results:  CBC  Recent Labs  02/10/15 1913 02/11/15 0512  WBC 8.4 8.0  HGB 15.0 13.3  HCT 43.8 38.8*  PLT 203 199   CMP     Component Value Date/Time   NA 136 02/11/2015 0512   K 4.2 02/11/2015 0512   CL 99* 02/11/2015 0512   CO2 29 02/11/2015 0512   GLUCOSE 134* 02/11/2015 0512   BUN 8 02/11/2015 0512   CREATININE 0.76 02/11/2015 0512   CREATININE 0.86 12/16/2014 0844   CALCIUM 8.2* 02/11/2015 0512   PROT 6.5 01/12/2015 1310   ALBUMIN 3.8 01/12/2015 1310   AST 26 01/12/2015 1310   ALT 20 01/12/2015 1310   ALKPHOS 51 01/12/2015 1310   BILITOT 0.5 01/12/2015 1310   GFRNONAA >60 02/11/2015 0512   GFRNONAA >89 12/16/2014 0844   GFRAA >60 02/11/2015 0512   GFRAA >89 12/16/2014 0844   PT/INR No results for input(s): LABPROT, INR in the last 72  hours.  Studies/Results: No results found.  Assessment/Plan: 60 year old male 2 days status post open extended right colectomy. Continues to have NG output to high for removal of the NG tube. Plan to remove the Foley catheter today. Encourage ambulation and since primary usage. Monitor NG output for possible removal his afternoon versus tomorrow morning. Awaiting return of bowel function.   [name], MD FACS General Surgeon  02/12/2015

## 2015-02-12 NOTE — Progress Notes (Signed)
I was called by the nurse for a patient who is complaining of shortness of breath. His chart was reviewed. He is 2 days status post colon resection. He has a nasogastric tube in place. He states he started getting short of breath probably an hour ago when he was taking a pill with some water. He also would like the nasogastric tube out.  Vital signs are reviewed and unchanged with the exception of an elevated diastolic and systolic blood pressure. Heart rate is unchanged through the day as is his respiration rate. Saturations are been 94-91.  Patient appears slightly anxious but fairly comfortable he is protecting his nasogastric tube.  he has some audible upper respiratory gurgling sounds in his throat but his chest auscultation is completely normal without wheezes rales or rhonchi. Cardiac is regular rate and rhythm. Calves are nontender.  Chest x-rays personally reviewed and showing no signs of pneumonia or volume overload. Official reading is pending.  EKG and ABG are pending. I will personally review these.  Currently the patient appears anxious but I see no abnormalities on his vital signs or saturation levels and his chest x-ray and chest exam appears relatively normal. I will review his ABG and EKG but for this point recommend continuing his nasogastric tube and his current care. This is discussed with his RN.

## 2015-02-12 NOTE — Progress Notes (Signed)
Will increase oxygen to 4L via [place] per Dr. Burt Knack r/t low arterial PO2.

## 2015-02-12 NOTE — Progress Notes (Signed)
Patient having breathing problems MD, Burt Knack aware, orders received for an ABG, chest x-ray, and EKG. Will continue to monitor.

## 2015-02-13 ENCOUNTER — Inpatient Hospital Stay: Payer: Medicaid Other

## 2015-02-13 NOTE — Progress Notes (Signed)
3 Days Post-Op  Subjective: Postop from colon resection. He should see in last night for subjective shortness of breath. Today he says he feels better and is no longer short of breath wants his NG tube out he has minimal pain and is using his PCA  Objective: Vital signs in last 24 hours: Temp:  [97.7 F (36.5 C)-98.6 F (37 C)] 98.1 F (36.7 C) (01/07 0551) Pulse Rate:  [97-120] 97 (01/07 0602) Resp:  [11-18] 13 (01/07 0551) BP: (131-170)/(71-105) 131/79 mmHg (01/07 0602) SpO2:  [91 %-97 %] 97 % (01/07 0551) Last BM Date: 02/10/15  Intake/Output from previous day: 01/06 0701 - 01/07 0700 In: 325 [I.V.:325] Out: 1150 [Urine:300; Emesis/NG output:850] Intake/Output this shift:    Physical exam:  No acute respiratory distress. There is some rhonchi on the right and some wheezing on the (he has not had a breathing treatment since late last night) Abdomen is soft nondistended minimally tympanitic and minimally tender wound is covered as are the laparoscopy wounds. Calves are nontender  Lab Results: CBC   Recent Labs  02/10/15 1913 02/11/15 0512  WBC 8.4 8.0  HGB 15.0 13.3  HCT 43.8 38.8*  PLT 203 199   BMET  Recent Labs  02/10/15 1913 02/11/15 0512  NA  --  136  K  --  4.2  CL  --  99*  CO2  --  29  GLUCOSE  --  134*  BUN  --  8  CREATININE 0.85 0.76  CALCIUM  --  8.2*   PT/INR No results for input(s): LABPROT, INR in the last 72 hours. ABG  Recent Labs  02/12/15 2330  PHART 7.47*  HCO3 34.9*    Studies/Results: Dg Chest Port 1 View  02/12/2015  CLINICAL DATA:  Shortness of breath beginning tonight approximate 1 hour ago. EXAM: PORTABLE CHEST 1 VIEW COMPARISON:  01/18/2015 FINDINGS: Cardiac silhouette normal in size and configuration. No mediastinal or hilar masses or evidence of adenopathy. Mild streaky opacity in the medial right lung base is likely atelectasis. Lungs are otherwise clear. No pleural effusion or pneumothorax. Bony thorax is  demineralized but grossly intact. There orogastric tube passes below the diaphragm into the stomach. IMPRESSION: 1. Mild basilar atelectasis on the right. No acute cardiopulmonary disease. Orogastric tube is well positioned. Electronically Signed   By: Lajean Manes M.D.   On: 02/12/2015 23:46    Anti-infectives: Anti-infectives    Start     Dose/Rate Route Frequency Ordered Stop   02/10/15 0124  cefoTEtan (CEFOTAN) 2 g in dextrose 5 % 50 mL IVPB     2 g 100 mL/hr over 30 Minutes Intravenous On call to O.R. 02/10/15 0124 02/10/15 1317   02/10/15 0124  neomycin (MYCIFRADIN) tablet 1,000 mg  Status:  Discontinued     1,000 mg Oral 3 times per day 02/10/15 0124 02/10/15 1743   02/10/15 0124  erythromycin (E-MYCIN) tablet 1,000 mg  Status:  Discontinued     1,000 mg Oral 3 times per day 02/10/15 0124 02/10/15 1743      Assessment/Plan: s/p Procedure(s): LAPAROSCOPIC THEN OPEN RIGHT HEMI COLECTOMY   Subjectively improved this morning but now he has wheezes and rhonchi. With his history of this starting after trying to take a pill I'm concerned he may have aspirated but his x-ray last night was normal. At this point he has responded well to a slight increase in his oxygen FiO2. I would consider continuing current care for now and would not change treatment  plan but probably add a chest x-ray later today.  Florene Glen, MD, FACS  02/13/2015

## 2015-02-14 ENCOUNTER — Encounter: Payer: Self-pay | Admitting: Hematology and Oncology

## 2015-02-14 DIAGNOSIS — Z86010 Personal history of colonic polyps: Secondary | ICD-10-CM

## 2015-02-14 DIAGNOSIS — I10 Essential (primary) hypertension: Secondary | ICD-10-CM

## 2015-02-14 DIAGNOSIS — C772 Secondary and unspecified malignant neoplasm of intra-abdominal lymph nodes: Secondary | ICD-10-CM

## 2015-02-14 DIAGNOSIS — Z9889 Other specified postprocedural states: Secondary | ICD-10-CM

## 2015-02-14 DIAGNOSIS — K769 Liver disease, unspecified: Secondary | ICD-10-CM

## 2015-02-14 DIAGNOSIS — Z9049 Acquired absence of other specified parts of digestive tract: Secondary | ICD-10-CM

## 2015-02-14 DIAGNOSIS — E7800 Pure hypercholesterolemia, unspecified: Secondary | ICD-10-CM

## 2015-02-14 DIAGNOSIS — K219 Gastro-esophageal reflux disease without esophagitis: Secondary | ICD-10-CM

## 2015-02-14 DIAGNOSIS — F17210 Nicotine dependence, cigarettes, uncomplicated: Secondary | ICD-10-CM

## 2015-02-14 DIAGNOSIS — R197 Diarrhea, unspecified: Secondary | ICD-10-CM

## 2015-02-14 DIAGNOSIS — J9811 Atelectasis: Secondary | ICD-10-CM

## 2015-02-14 LAB — CBC WITH DIFFERENTIAL/PLATELET
BASOS ABS: 0 10*3/uL (ref 0–0.1)
Basophils Relative: 0 %
Eosinophils Absolute: 0 10*3/uL (ref 0–0.7)
Eosinophils Relative: 0 %
HEMATOCRIT: 37.1 % — AB (ref 40.0–52.0)
HEMOGLOBIN: 12.6 g/dL — AB (ref 13.0–18.0)
LYMPHS ABS: 0.7 10*3/uL — AB (ref 1.0–3.6)
LYMPHS PCT: 7 %
MCH: 32.7 pg (ref 26.0–34.0)
MCHC: 34 g/dL (ref 32.0–36.0)
MCV: 96.3 fL (ref 80.0–100.0)
Monocytes Absolute: 0.9 10*3/uL (ref 0.2–1.0)
Monocytes Relative: 8 %
NEUTROS ABS: 9.1 10*3/uL — AB (ref 1.4–6.5)
NEUTROS PCT: 85 %
PLATELETS: 181 10*3/uL (ref 150–440)
RBC: 3.85 MIL/uL — AB (ref 4.40–5.90)
RDW: 13.8 % (ref 11.5–14.5)
WBC: 10.7 10*3/uL — AB (ref 3.8–10.6)

## 2015-02-14 LAB — BASIC METABOLIC PANEL
ANION GAP: 9 (ref 5–15)
BUN: 9 mg/dL (ref 6–20)
CHLORIDE: 94 mmol/L — AB (ref 101–111)
CO2: 34 mmol/L — AB (ref 22–32)
Calcium: 8.5 mg/dL — ABNORMAL LOW (ref 8.9–10.3)
Creatinine, Ser: 0.61 mg/dL (ref 0.61–1.24)
GFR calc Af Amer: >60 mL/min (ref >60)
GLUCOSE: 106 mg/dL — AB (ref 65–99)
POTASSIUM: 3.2 mmol/L — AB (ref 3.5–5.1)
Sodium: 137 mmol/L (ref 135–145)

## 2015-02-14 MED ORDER — POTASSIUM CHLORIDE 10 MEQ/100ML IV SOLN
10.0000 meq | INTRAVENOUS | Status: AC
  Administered 2015-02-14 (×3): 10 meq via INTRAVENOUS
  Filled 2015-02-14 (×3): qty 100

## 2015-02-14 MED ORDER — MORPHINE BOLUS VIA INFUSION
4.0000 mg | Freq: Once | INTRAVENOUS | Status: AC
  Administered 2015-02-14: 4 mg via INTRAVENOUS
  Filled 2015-02-14: qty 4

## 2015-02-14 MED ORDER — DIAZEPAM 5 MG/ML IJ SOLN: 5.0000 mg | Freq: Four times a day (QID) | INTRAMUSCULAR | Status: DC | PRN

## 2015-02-14 MED ORDER — HYDROMORPHONE HCL 1 MG/ML IJ SOLN
1.0000 mg | Freq: Once | INTRAMUSCULAR | Status: AC
  Administered 2015-02-15: 1 mg via INTRAVENOUS
  Filled 2015-02-14 (×2): qty 1

## 2015-02-14 NOTE — Progress Notes (Signed)
4 Days Post-Op   Subjective:  No acute events overnight. Patient's NG tube has been off of suction for nearly 24 hours. She denies any nausea, vomiting. He has passed flatus and thought he was near having a bowel movement.  Vital signs in last 24 hours: Temp:  [97.4 F (36.3 C)-98.6 F (37 C)] 98.6 F (37 C) (01/08 1248) Pulse Rate:  [105-114] 114 (01/08 1248) Resp:  [12-20] 20 (01/08 1248) BP: (131-173)/(84-89) 131/89 mmHg (01/08 1248) SpO2:  [96 %-99 %] 98 % (01/08 1248) Last BM Date:  (pt stated before he came into hospital)  Intake/Output from previous day: 01/07 0701 - 01/08 0700 In: 5147.3 [I.V.:5147.3] Out: 1700 [Urine:800; Emesis/NG output:900]  Physical exam Gen.: No acute distress Chest: Clear to auscultation Heart: Regular rate and rhythm GI: abdomen soft, appropriately tender to palpation, mildly distended, midline incision and laparoscopic incisions well approximated with staples. Dressing removed today without any evidence of active drainage.  Lab Results:  CBC  Recent Labs  02/14/15 0501  WBC 10.7*  HGB 12.6*  HCT 37.1*  PLT 181   CMP     Component Value Date/Time   NA 137 02/14/2015 0501   K 3.2* 02/14/2015 0501   CL 94* 02/14/2015 0501   CO2 34* 02/14/2015 0501   GLUCOSE 106* 02/14/2015 0501   BUN 9 02/14/2015 0501   CREATININE 0.61 02/14/2015 0501   CREATININE 0.86 12/16/2014 0844   CALCIUM 8.5* 02/14/2015 0501   PROT 6.5 01/12/2015 1310   ALBUMIN 3.8 01/12/2015 1310   AST 26 01/12/2015 1310   ALT 20 01/12/2015 1310   ALKPHOS 51 01/12/2015 1310   BILITOT 0.5 01/12/2015 1310   GFRNONAA >60 02/14/2015 0501   GFRNONAA >89 12/16/2014 0844   GFRAA >60 02/14/2015 0501   GFRAA >89 12/16/2014 0844   PT/INR No results for input(s): LABPROT, INR in the last 72 hours.  Studies/Results: Dg Chest Port 1 View  02/13/2015  CLINICAL DATA:  Shortness of breath. Status post partial colectomy on 02/10/2015. EXAM: PORTABLE CHEST 1 VIEW COMPARISON:   02/12/2015 FINDINGS: Enteric catheter transverses the thorax, tip collimated off the image. Cardiomediastinal silhouette is normal. Mediastinal contours appear intact. There is no evidence of focal airspace consolidation, pleural effusion or pneumothorax. There is persistent linear opacity in the right lung base, which likely represents atelectasis. Osseous structures are without acute abnormality. Soft tissues are grossly normal. IMPRESSION: Persistent right lung base atelectasis, otherwise no radiographic evidence of acute cardiopulmonary process. Electronically Signed   By: Fidela Salisbury M.D.   On: 02/13/2015 14:53   Dg Chest Port 1 View  02/12/2015  CLINICAL DATA:  Shortness of breath beginning tonight approximate 1 hour ago. EXAM: PORTABLE CHEST 1 VIEW COMPARISON:  01/18/2015 FINDINGS: Cardiac silhouette normal in size and configuration. No mediastinal or hilar masses or evidence of adenopathy. Mild streaky opacity in the medial right lung base is likely atelectasis. Lungs are otherwise clear. No pleural effusion or pneumothorax. Bony thorax is demineralized but grossly intact. There orogastric tube passes below the diaphragm into the stomach. IMPRESSION: 1. Mild basilar atelectasis on the right. No acute cardiopulmonary disease. Orogastric tube is well positioned. Electronically Signed   By: Lajean Manes M.D.   On: 02/12/2015 23:46    Assessment/Plan: 60 year old male 4 days status post laparoscopic converted to open extended right hemicolectomy. NG tube placed back to suction with less than 200 mL's of gastric residual and thus NG tube able be removed this morning. Patient has been intermittently tachycardic  however on my exam he was of a regular rhythm and rate at 83 bpm. During the removal of his dressing patient noted that it hurt and his heart rate shot up into the 120s temporarily. His heart rate returned into the 80s by the time I left his room.  Continue to await return of bowel  function. Anticipate he'll do started diet later today however since his NG tube has now been removed.  We will place an inpatient consultation for oncology today as pathology is official as being cancer.   [name], MD FACS General Surgeon  02/14/2015

## 2015-02-14 NOTE — Progress Notes (Signed)
Came to check the patient's wound, he was sitting up in bed taking in some clear liquids.  When moved the sheet away was soaked through with some serosanguinous type fluid.  Evaluated his wound and removed some staples up to the umbilicus.  Brisk bloody fluid from the wound, stopped while inspecting it, could not find the opening.  I packed the wound which stopped the bleeding.  Had him move around to the chair to clean up the sheets from bed, it started bleeding again.  Wound was again inspected and more staples removed.  Patient returned to bed and site of bleeding found just above umbilicus along the fascia.  A 0-Vicryl suture was used in a figure of eight stitch x 2 which stopped the bleeding.  Had patient cough and sutures held and no future bleeding.  Wound was packed and gauze and ABD pad dressing placed on top.    Patient's pain a 9 of 10 and Pulse 140s after having the stitches.  Order for one time dose of 4mg  Morphine given.  Nurses instructed to call if ABD pad gets soaked again.  Instructed the patient to notify staff if felt more pain or if felt anything oozing from the area.

## 2015-02-14 NOTE — Progress Notes (Signed)
Dr Azalee Course notified of pt's dressing leaking . Stated would be by to see pt.

## 2015-02-14 NOTE — Progress Notes (Signed)
DrAdonis Huguenin notifed of tachycardic .HR of 126... He will see patient shortly.

## 2015-02-14 NOTE — Progress Notes (Signed)
[facility]  Date of admission:  02/10/2015  Inpatient day:  02/14/2015  Consulting physician:  Dr. [name]  Reason for Consultation:  New diagnosis stage T4T2 colon cancer.  Chief Complaint: Erik [name] is a 60 y.o. male who was admitted for laparoscopic right hemicolectomy.  HPI:  The patient notes that his first screening colonoscopy was on 12/05/2009.  He states "11 polyps were removed".  Pathology revealed hyperplastic polyps and tubular adenomas.  There was no evidence of malignancy or high grade dysplasia.  He began to have significant reflux.  He also describes a 2 month history of diarrhea.  Medications were adjusted without relief of symptoms.  He was seen in follow-up by Dr. Barney Drain.  On 01/12/2015, he underwent EGD and colonoscopy.  EGD revealed moderate erosive gastris.  Colonoscopy revealed a 5-6 cm half circumferential fungating mass at the splenic flexure.  Three sessile 4-5 mm polyps were seen in the rectum, sigmoid colon, and transverse colon.  Pathology revealed a tubulovillous adenoma with high grade dysplasia in the distal transverse colon.  There were 2 additional hyperplastic polys in the sigmoid/rectum.  Abdomen/pelvic CT scan on 01/18/2015 revealed an apple core lesion in the mid transverse colon without obstruction.  There was a stable 8 mm hypo-attenuating lesion in the left liver (compared to 09/28/2009).  CXR on 02/13/2015 revealed persistent right lung base atelectasis with no acute cardiopulmonary process.  CEA was 6.6 on 01/12/2016.  He underwent extended right hemicolectomy by Dr. [name] on 02/10/2015.  Pathology a 4.8 cm mucinous adenocarcinoma with penetration of the visceral peritoneum and 4 of 19 lymph nodes positive.  Margins were negative.  There was perineural and lymphovascular invasion.  Pathologic stage was T4aN2a.    He tolerated the surgery well.  He had a small bowel movement this morning.  He recently  experienced bleeding from his wound today.  Past Medical History  Diagnosis Date  . Head trauma 2001    closed head injury; coma for 4 weeks  . Depression   . Abnormal stress echocardiogram   . Hypercholesterolemia   . Hypertension   . Alcohol abuse     Heavy Use up until 2010  . GERD (gastroesophageal reflux disease)     Past Surgical History  Procedure Laterality Date  . Kidney surgery      >30 years ago  . Cardiac cath    . Craniotomy  2001  . Head injury surgery    . Colonoscopy  2011    Dr. Oneida Alar: multiple adenomas and hyperplastic polyps  . Colonoscopy with propofol N/A 01/12/2015    Procedure: COLONOSCOPY WITH PROPOFOL;  Surgeon: Danie Binder, MD;  Location: [facility];  Service: Endoscopy;  Laterality: N/A;  1030  . Esophagogastroduodenoscopy (egd) with propofol N/A 01/12/2015    Procedure: ESOPHAGOGASTRODUODENOSCOPY (EGD) WITH PROPOFOL;  Surgeon: Danie Binder, MD;  Location: [facility];  Service: Endoscopy;  Laterality: N/A;  . Hernia repair Right 2012    Inguinal- Forestine Na  . Laparoscopic right hemi colectomy Left 02/10/2015    Procedure: LAPAROSCOPIC THEN OPEN RIGHT HEMI COLECTOMY;  Surgeon: [name], MD;  Location: [facility] ORS;  Service: General;  Laterality: Left;    Family History  Problem Relation Age of Onset  . Ataxia Neg Hx   . Chorea Neg Hx   . Dementia Neg Hx   . Mental retardation Neg Hx   . Migraines Neg Hx   . Multiple sclerosis Neg Hx   .  Neurofibromatosis Neg Hx   . Neuropathy Neg Hx   . Parkinsonism Neg Hx   . Seizures Neg Hx   . Stroke Neg Hx   . Colon cancer Neg Hx   . Pulmonary embolism Mother   . Deep vein thrombosis Mother   . Heart disease Father   . Cancer Father     Leukemia    Social History:  reports that he has been smoking Cigarettes.  He has been smoking about 0.25 packs per day. He has never used smokeless tobacco. He reports that he drinks alcohol. He reports that he uses illicit drugs (Marijuana).  He lives  alone in [place] and (close to [facility]).  He does not have much contact with his 2 daughters (age 93 and 26).  The patient is alone today.  Allergies: No Known Allergies  Medications Prior to Admission  Medication Sig Dispense Refill  . albuterol (PROVENTIL HFA;VENTOLIN HFA) 108 (90 BASE) MCG/ACT inhaler Inhale 2 puffs into the lungs every 6 (six) hours as needed for wheezing or shortness of breath. 1 Inhaler 1  . amitriptyline (ELAVIL) 25 MG tablet TAKE ONE TABLET BY MOUTH AT BEDTIME. (Patient taking differently: TAKE ONE TABLET BY MOUTH AT BEDTIME. prn) 30 tablet 2  . dexlansoprazole (DEXILANT) 60 MG capsule Take 60 mg by mouth daily.    Marland Kitchen FLUoxetine (PROZAC) 20 MG capsule Take 20 mg by mouth daily.    . rosuvastatin (CRESTOR) 40 MG tablet Take 1 tablet (40 mg total) by mouth daily. 90 tablet 1  . [DISCONTINUED] bisacodyl (DULCOLAX) 5 MG EC tablet Take 4 tablets (20 mg total) by mouth once. 4 tablet 0  . [DISCONTINUED] polyethylene glycol powder (GLYCOLAX/MIRALAX) powder Take 255 g by mouth once. 255 g 0  . sildenafil (VIAGRA) 100 MG tablet Take 0.5-1 tablets (50-100 mg total) by mouth daily as needed for erectile dysfunction. 10 tablet 1    Review of Systems: GENERAL:  Feels "ok".  No fevers, sweats or weight loss. PERFORMANCE STATUS (ECOG):  1 HEENT:  No visual changes, runny nose, sore throat, mouth sores or tenderness. Lungs: No shortness of breath or cough.  No hemoptysis. Cardiac:  Blood pressure fluctuates.  No chest pain, palpitations, orthopnea, or PND. GI:  Reflux.  No nausea, vomiting, diarrhea, constipation, melena or hematochezia. GU:  No urgency, frequency, dysuria, or hematuria. Musculoskeletal:  No back pain.  No joint pain.  No muscle tenderness. Extremities:  No pain or swelling. Skin:  No rashes or skin changes. Neuro:  No headache, numbness or weakness, balance or coordination issues. Endocrine:  No diabetes, thyroid issues, hot flashes or night  sweats. Psych:  PTSD.  Anxiety.  Depression. Pain:  Post operative pain, improved. Review of systems:  All other systems reviewed and found to be negative.  Physical Exam:  Blood pressure 142/74, pulse 112, temperature 98.5 F (36.9 C), temperature source Oral, resp. rate 18, height _0  (1.88 m), weight 230 lb (104.327 kg), SpO2 97 %.  GENERAL:  Well developed, well nourished, sitting comfortably on the medical unit in no acute distress. MENTAL STATUS:  Alert and oriented to person, place and time. HEAD:  Pearline Cables hair.  Male pattern baldness.  Scruffy beard.  Mustache.  Normocephalic, atraumatic, face symmetric, no Cushingoid features. EYES: Glasses.  Hazel/green eyes.  Pupils equal round and reactive to light and accomodation.  No conjunctivitis or scleral icterus. ENT:  [place] in place.  Oropharynx clear without lesion.  Tongue normal. Mucous membranes moist.  RESPIRATORY:  Clear to auscultation without rales, wheezes or rhonchi. CARDIOVASCULAR:  Regular rate and rhythm without murmur, rub or gallop. ABDOMEN:  Slightly distended and tender.  No guarding or rebound tenderness.  Soft  with active bowel sounds, and no appreciable hepatosplenomegaly.  No masses. SKIN:  Midline abdominal incision with staples in place.  Lower portion of bandage soaked in blood.  No rashes, ulcers or lesions. EXTREMITIES: No edema, no skin discoloration or tenderness.  No palpable cords. LYMPH NODES: No palpable cervical, supraclavicular, axillary or inguinal adenopathy  NEUROLOGICAL: Unremarkable. PSYCH:  Appropriate.  Results for orders placed or performed during the hospital encounter of 02/10/15 (from the past 48 hour(s))  Blood gas, arterial     Status: Abnormal   Collection Time: 02/12/15 11:30 PM  Result Value Ref Range   FIO2 0.28    Delivery systems NASAL CANNULA    pH, Arterial 7.47 (H) 7.350 - 7.450   pCO2 arterial 48 32.0 - 48.0 mmHg   pO2, Arterial 64 (L) 83.0 - 108.0 mmHg   Bicarbonate 34.9 (H)  21.0 - 28.0 mEq/L   Acid-Base Excess 9.7 (H) 0.0 - 3.0 mmol/L   O2 Saturation 93.4 %   Patient temperature 37.0    Collection site RIGHT RADIAL    Sample type ARTERIAL DRAW    Allens test (pass/fail) PASS PASS  CBC with Differential/Platelet     Status: Abnormal   Collection Time: 02/14/15  5:01 AM  Result Value Ref Range   WBC 10.7 (H) 3.8 - 10.6 K/uL   RBC 3.85 (L) 4.40 - 5.90 MIL/uL   Hemoglobin 12.6 (L) 13.0 - 18.0 g/dL   HCT 37.1 (L) 40.0 - 52.0 %   MCV 96.3 80.0 - 100.0 fL   MCH 32.7 26.0 - 34.0 pg   MCHC 34.0 32.0 - 36.0 g/dL   RDW 13.8 11.5 - 14.5 %   Platelets 181 150 - 440 K/uL   Neutrophils Relative % 85 %   Neutro Abs 9.1 (H) 1.4 - 6.5 K/uL   Lymphocytes Relative 7 %   Lymphs Abs 0.7 (L) 1.0 - 3.6 K/uL   Monocytes Relative 8 %   Monocytes Absolute 0.9 0.2 - 1.0 K/uL   Eosinophils Relative 0 %   Eosinophils Absolute 0.0 0 - 0.7 K/uL   Basophils Relative 0 %   Basophils Absolute 0.0 0 - 0.1 K/uL  Basic metabolic panel     Status: Abnormal   Collection Time: 02/14/15  5:01 AM  Result Value Ref Range   Sodium 137 135 - 145 mmol/L   Potassium 3.2 (L) 3.5 - 5.1 mmol/L   Chloride 94 (L) 101 - 111 mmol/L   CO2 34 (H) 22 - 32 mmol/L   Glucose, Bld 106 (H) 65 - 99 mg/dL   BUN 9 6 - 20 mg/dL   Creatinine, Ser 0.61 0.61 - 1.24 mg/dL   Calcium 8.5 (L) 8.9 - 10.3 mg/dL   GFR calc non Af Amer >60 >60 mL/min   GFR calc Af Amer >60 >60 mL/min    Comment: (NOTE) The eGFR has been calculated using the CKD EPI equation. This calculation has not been validated in all clinical situations. eGFR's persistently <60 mL/min signify possible Chronic Kidney Disease.    Anion gap 9 5 - 15   Dg Chest Port 1 View  02/13/2015  CLINICAL DATA:  Shortness of breath. Status post partial colectomy on 02/10/2015. EXAM: PORTABLE CHEST 1 VIEW COMPARISON:  02/12/2015 FINDINGS: Enteric catheter transverses the thorax, tip collimated  off the image. Cardiomediastinal silhouette is normal.  Mediastinal contours appear intact. There is no evidence of focal airspace consolidation, pleural effusion or pneumothorax. There is persistent linear opacity in the right lung base, which likely represents atelectasis. Osseous structures are without acute abnormality. Soft tissues are grossly normal. IMPRESSION: Persistent right lung base atelectasis, otherwise no radiographic evidence of acute cardiopulmonary process. Electronically Signed   By: Fidela Salisbury M.D.   On: 02/13/2015 14:53   Dg Chest Port 1 View  02/12/2015  CLINICAL DATA:  Shortness of breath beginning tonight approximate 1 hour ago. EXAM: PORTABLE CHEST 1 VIEW COMPARISON:  01/18/2015 FINDINGS: Cardiac silhouette normal in size and configuration. No mediastinal or hilar masses or evidence of adenopathy. Mild streaky opacity in the medial right lung base is likely atelectasis. Lungs are otherwise clear. No pleural effusion or pneumothorax. Bony thorax is demineralized but grossly intact. There orogastric tube passes below the diaphragm into the stomach. IMPRESSION: 1. Mild basilar atelectasis on the right. No acute cardiopulmonary disease. Orogastric tube is well positioned. Electronically Signed   By: Lajean Manes M.D.   On: 02/12/2015 23:46    Assessment:  The patient is a 60 y.o.  gentleman with stage IIIC (T4N2M0) adenocarcinoma of the transverse colon status post right hemicolectomy on 02/10/2015.  Pathology revealed a 4.8 cm mucinous adenocarcinoma with penetration of the visceral peritoneum.  Four of 19 lymph nodes were positive.  Margins were negative.  There was perineural and lymphovascular invasion.  Pathologic stage was T4aN2a.    Abdomen and pelvic CT scan on 01/18/2015 revealed an apple core lesion in the mid transverse colon without obstruction.  There was a stable 8 mm hypo-attenuating lesion in the left liver (compared to 09/28/2009).  CXR on 02/13/2015 revealed no acute cardiopulmonary process.  CEA was 6.6 on  01/12/2016.  He denies any family history of colon cancer.  His father had leukemia (died age 77), mother had breast cancer (died age 48), and his paternal great grandfather had lung cancer.  He has 2 daughters (age 60 and 47).  Symptomatically, he has had some bleeding from his incision today.  Plan:   1.  Discuss diagnosis of stage IIIC colon cancer.  Review abdomen and pelvic CT scan with patient.  Review pathology report in detail with patient.  Discuss plan for chest CT to complete staging.  Discuss plan for initiation of chemotherapy in 3-4 weeks.  Discuss FOLFOX chemotherapy (side effects reviewed in detail).  Discuss likely plan for treatment at [facility] (patient does not drive and lives in [place] on bus route). Discuss port-a-cath placement.  Discuss consideration of genetic testing (patient has 2 daughters).  Discuss MSI/MMR testing on specimen. 2.  Anticipate chest CT (inpatient or outpatient). 3.  Anticipate port-a-cath placement within the next 2-3 weeks. 4.  Anticipate follow-up with [facility] oncology at [facility].   Thank you for allowing me to participate in MATHHEW BUYSSE 's care.  I will follow him closely with you while hospitalized and after discharge in the outpatient department.  Lequita Asal, MD  02/14/2015, 10:52 PM

## 2015-02-14 NOTE — Progress Notes (Signed)
Called to bedside due to bleeding from incision.  Patient noted to have blood soaked gown and pad underneath him in chair upon entering the room. Pressure dressing that had been placed by nursing staff with sanguinous drainage flowing from under the bandage.  Returned patient to bed and bandage removed. Single area of bleeding noted just below umbilicus from midline incision. Pressure held and bleeding stopped, dressing re-applied.  Called back to room due to more bleeding. Bleeding noted from same area and pressure held again. I went to the OR to get suture to attempt a suture ligation. Returning to the room when pressure was released, again there was no continued evidence of bleeding. Stapled removed from area of concern and still there was no evidence of bleeding. Pressure dressing of gauze and silk tape re-applied.  Will have the PM call surgeon recheck wound in 1 hour.  [name], MD FACS General Surgeon [facility]

## 2015-02-14 NOTE — Plan of Care (Signed)
Problem: Nutrition: Goal: Adequate nutrition will be maintained Outcome: Not Progressing Patient is NPO.   

## 2015-02-15 LAB — CBC
HCT: 36.7 % — ABNORMAL LOW (ref 40.0–52.0)
Hemoglobin: 12.4 g/dL — ABNORMAL LOW (ref 13.0–18.0)
MCH: 32.1 pg (ref 26.0–34.0)
MCHC: 33.8 g/dL (ref 32.0–36.0)
MCV: 94.8 fL (ref 80.0–100.0)
PLATELETS: 205 10*3/uL (ref 150–440)
RBC: 3.87 MIL/uL — ABNORMAL LOW (ref 4.40–5.90)
RDW: 13.7 % (ref 11.5–14.5)
WBC: 8.5 10*3/uL (ref 3.8–10.6)

## 2015-02-15 LAB — BASIC METABOLIC PANEL
Anion gap: 6 (ref 5–15)
BUN: 11 mg/dL (ref 6–20)
CALCIUM: 8.2 mg/dL — AB (ref 8.9–10.3)
CO2: 35 mmol/L — ABNORMAL HIGH (ref 22–32)
Chloride: 94 mmol/L — ABNORMAL LOW (ref 101–111)
Creatinine, Ser: 0.66 mg/dL (ref 0.61–1.24)
GFR calc Af Amer: >60 mL/min (ref >60)
GFR calc non Af Amer: >60 mL/min (ref >60)
GLUCOSE: 121 mg/dL — AB (ref 65–99)
Potassium: 3.3 mmol/L — ABNORMAL LOW (ref 3.5–5.1)
SODIUM: 135 mmol/L (ref 135–145)

## 2015-02-15 NOTE — Progress Notes (Signed)
MD notifed of copious drainage throughout the day from dehisced incision. Dr. Burt Knack acknowledged.

## 2015-02-15 NOTE — Progress Notes (Signed)
5 Days Post-Op  Subjective: Events of last night noted. I discussed the care with Dr. Heath Lark and with Dr. Adonis Huguenin. This morning patient has no complaints the dressing is intact and he has minimal pain he is passing gas and having dark bowel movements.  Objective: Vital signs in last 24 hours: Temp:  [97.8 F (36.6 C)-98.6 F (37 C)] 98 F (36.7 C) (01/09 0439) Pulse Rate:  [96-114] 96 (01/09 0439) Resp:  [13-111] 13 (01/09 0746) BP: (131-169)/(73-89) 142/73 mmHg (01/09 0439) SpO2:  [93 %-98 %] 94 % (01/09 0749) FiO2 (%):  [0 %] 0 % (01/09 0328) Last BM Date: 02/14/15  Intake/Output from previous day: 01/08 0701 - 01/09 0700 In: 3295 [P.O.:840; I.V.:2455] Out: 400 [Urine:400] Intake/Output this shift:    Physical exam:  Abdomen is soft and nontender midline wound is dressed with a dressing that is somewhat saturated with serous fluid no frank blood. Calves are nontender  Lab Results: CBC   Recent Labs  02/14/15 0501 02/15/15 0456  WBC 10.7* 8.5  HGB 12.6* 12.4*  HCT 37.1* 36.7*  PLT 181 205   BMET  Recent Labs  02/14/15 0501 02/15/15 0456  NA 137 135  K 3.2* 3.3*  CL 94* 94*  CO2 34* 35*  GLUCOSE 106* 121*  BUN 9 11  CREATININE 0.61 0.66  CALCIUM 8.5* 8.2*   PT/INR No results for input(s): LABPROT, INR in the last 72 hours. ABG  Recent Labs  02/12/15 2330  PHART 7.47*  HCO3 34.9*    Studies/Results: Dg Chest Port 1 View  02/13/2015  CLINICAL DATA:  Shortness of breath. Status post partial colectomy on 02/10/2015. EXAM: PORTABLE CHEST 1 VIEW COMPARISON:  02/12/2015 FINDINGS: Enteric catheter transverses the thorax, tip collimated off the image. Cardiomediastinal silhouette is normal. Mediastinal contours appear intact. There is no evidence of focal airspace consolidation, pleural effusion or pneumothorax. There is persistent linear opacity in the right lung base, which likely represents atelectasis. Osseous structures are without acute abnormality.  Soft tissues are grossly normal. IMPRESSION: Persistent right lung base atelectasis, otherwise no radiographic evidence of acute cardiopulmonary process. Electronically Signed   By: Fidela Salisbury M.D.   On: 02/13/2015 14:53    Anti-infectives: Anti-infectives    Start     Dose/Rate Route Frequency Ordered Stop   02/10/15 0124  cefoTEtan (CEFOTAN) 2 g in dextrose 5 % 50 mL IVPB     2 g 100 mL/hr over 30 Minutes Intravenous On call to O.R. 02/10/15 0124 02/10/15 1317   02/10/15 0124  neomycin (MYCIFRADIN) tablet 1,000 mg  Status:  Discontinued     1,000 mg Oral 3 times per day 02/10/15 0124 02/10/15 1743   02/10/15 0124  erythromycin (E-MYCIN) tablet 1,000 mg  Status:  Discontinued     1,000 mg Oral 3 times per day 02/10/15 0124 02/10/15 1743      Assessment/Plan: s/p Procedure(s): LAPAROSCOPIC THEN OPEN RIGHT HEMI COLECTOMY   Recommend abdominal binder and dressing changes as needed. We'll advance diet once able.Florene Glen, MD, FACS  02/15/2015

## 2015-02-16 LAB — CBC WITH DIFFERENTIAL/PLATELET
BASOS PCT: 1 %
Basophils Absolute: 0.1 10*3/uL (ref 0–0.1)
Eosinophils Absolute: 0.1 10*3/uL (ref 0–0.7)
Eosinophils Relative: 2 %
HEMATOCRIT: 32.3 % — AB (ref 40.0–52.0)
HEMOGLOBIN: 11.1 g/dL — AB (ref 13.0–18.0)
LYMPHS ABS: 1 10*3/uL (ref 1.0–3.6)
LYMPHS PCT: 16 %
MCH: 32.3 pg (ref 26.0–34.0)
MCHC: 34.4 g/dL (ref 32.0–36.0)
MCV: 94 fL (ref 80.0–100.0)
MONO ABS: 0.9 10*3/uL (ref 0.2–1.0)
MONOS PCT: 15 %
NEUTROS ABS: 4 10*3/uL (ref 1.4–6.5)
Neutrophils Relative %: 66 %
Platelets: 187 10*3/uL (ref 150–440)
RBC: 3.44 MIL/uL — ABNORMAL LOW (ref 4.40–5.90)
RDW: 13.6 % (ref 11.5–14.5)
WBC: 6.1 10*3/uL (ref 3.8–10.6)

## 2015-02-16 LAB — BASIC METABOLIC PANEL
Anion gap: 5 (ref 5–15)
BUN: 7 mg/dL (ref 6–20)
CALCIUM: 7.9 mg/dL — AB (ref 8.9–10.3)
CHLORIDE: 91 mmol/L — AB (ref 101–111)
CO2: 36 mmol/L — AB (ref 22–32)
CREATININE: 0.61 mg/dL (ref 0.61–1.24)
GFR calc Af Amer: >60 mL/min (ref >60)
GFR calc non Af Amer: >60 mL/min (ref >60)
GLUCOSE: 114 mg/dL — AB (ref 65–99)
Potassium: 3 mmol/L — ABNORMAL LOW (ref 3.5–5.1)
Sodium: 132 mmol/L — ABNORMAL LOW (ref 135–145)

## 2015-02-16 NOTE — Progress Notes (Signed)
6 Days Post-Op  Subjective: Status post colon resection. He states that the amount of dressing changes and drainage has decreased considerably and he has almost no abdominal pain.  Objective: Vital signs in last 24 hours: Temp:  [98 F (36.7 C)-98.7 F (37.1 C)] 98.2 F (36.8 C) (01/10 0910) Pulse Rate:  [83-103] 101 (01/10 0910) Resp:  [10-20] 18 (01/10 0910) BP: (123-145)/(62-77) 135/73 mmHg (01/10 0910) SpO2:  [92 %-99 %] 96 % (01/10 0910) FiO2 (%):  [0 %] 0 % (01/10 0359) Last BM Date: 02/15/15  Intake/Output from previous day: 01/09 0701 - 01/10 0700 In: 2239.2 [P.O.:1710; I.V.:529.2] Out: 250 [Urine:250] Intake/Output this shift:    Physical exam:  Serous fluid only in dressings no obvious dehiscence wound without erythema and no frank blood. Calves are nontender.  Lab Results: CBC   Recent Labs  02/15/15 0456 02/16/15 0538  WBC 8.5 6.1  HGB 12.4* 11.1*  HCT 36.7* 32.3*  PLT 205 187   BMET  Recent Labs  02/15/15 0456 02/16/15 0538  NA 135 132*  K 3.3* 3.0*  CL 94* 91*  CO2 35* 36*  GLUCOSE 121* 114*  BUN 11 7  CREATININE 0.66 0.61  CALCIUM 8.2* 7.9*   PT/INR No results for input(s): LABPROT, INR in the last 72 hours. ABG No results for input(s): PHART, HCO3 in the last 72 hours.  Invalid input(s): PCO2, PO2  Studies/Results: No results found.  Anti-infectives: Anti-infectives    Start     Dose/Rate Route Frequency Ordered Stop   02/10/15 0124  cefoTEtan (CEFOTAN) 2 g in dextrose 5 % 50 mL IVPB     2 g 100 mL/hr over 30 Minutes Intravenous On call to O.R. 02/10/15 0124 02/10/15 1317   02/10/15 0124  neomycin (MYCIFRADIN) tablet 1,000 mg  Status:  Discontinued     1,000 mg Oral 3 times per day 02/10/15 0124 02/10/15 1743   02/10/15 0124  erythromycin (E-MYCIN) tablet 1,000 mg  Status:  Discontinued     1,000 mg Oral 3 times per day 02/10/15 0124 02/10/15 1743      Assessment/Plan: s/p Procedure(s): LAPAROSCOPIC THEN OPEN RIGHT HEMI  COLECTOMY   Serous fluid from wound without obvious signs of dehiscence (Dr. Azalee Course had probed the wound yesterday showing no sign of laxity in the suture or dehiscence) Patient doing well will advance diet.  Florene Glen, MD, FACS  02/16/2015

## 2015-02-16 NOTE — Progress Notes (Signed)
Initial Nutrition Assessment     INTERVENTION:  Meals and snacks: await diet progression Medical Nutritional Supplement Therapy: If continues on clear liquid over next 48 hr recommend starting boost breeze TID for added nutrition. If diet progressed recommend Ensure Enlive po BID, each supplement provides 350 kcal and 20 grams of protein    NUTRITION DIAGNOSIS:   Inadequate oral intake related to altered GI function as evidenced by NPO status (and clear liquids for 6 days).    GOAL:   Patient will meet greater than or equal to 90% of their needs    MONITOR:   PO intake  REASON FOR ASSESSMENT:   NPO/Clear Liquid Diet    ASSESSMENT:      Pt s/p right hemicolectomy with wound dehisced.  Noted + gas and BM  Past Medical History  Diagnosis Date  . Head trauma 2001    closed head injury; coma for 4 weeks  . Depression   . Abnormal stress echocardiogram   . Hypercholesterolemia   . Hypertension   . Alcohol abuse     Heavy Use up until 2010  . GERD (gastroesophageal reflux disease)   . PTSD (post-traumatic stress disorder)   . Anxiety     Current Nutrition: tolerating liquids and wanting solid foods  Food/Nutrition-Related History: pt reports good/normal intake prior to admission   Scheduled Medications:  . albuterol  2.5 mg Nebulization BID  . enoxaparin (LOVENOX) injection  40 mg Subcutaneous Q24H  . morphine   Intravenous 6 times per day    Continuous Medications:  . lactated ringers 125 mL/hr at 02/16/15 0855     Electrolyte/Renal Profile and Glucose Profile:   Recent Labs Lab 02/11/15 0512 02/14/15 0501 02/15/15 0456 02/16/15 0538  NA 136 137 135 132*  K 4.2 3.2* 3.3* 3.0*  CL 99* 94* 94* 91*  CO2 29 34* 35* 36*  BUN 8 9 11 7   CREATININE 0.76 0.61 0.66 0.61  CALCIUM 8.2* 8.5* 8.2* 7.9*  MG 1.5*  --   --   --   PHOS 4.4  --   --   --   GLUCOSE 134* 106* 121* 114*    Gastrointestinal Profile: Last BM: 01/9   Nutrition-Focused  Physical Exam Findings:  Unable to complete Nutrition-Focused physical exam at this time.    Weight Change: stable wt per pt    Diet Order:  Diet clear liquid Room service appropriate?: Yes; Fluid consistency:: Thin  Skin:   reviewed   Height:   Ht Readings from Last 1 Encounters:  02/10/15 6\' 2"  (1.88 m)    Weight:   Wt Readings from Last 1 Encounters:  02/10/15 230 lb (104.327 kg)    Ideal Body Weight:     BMI:  Body mass index is 29.52 kg/(m^2).  Estimated Nutritional Needs:   Kcal:  BEE 1934 kcals (IF 1.0-1.2, AF 1.3) NE:9776110 kcals/d  Protein:  (1.0-1.2 g/kg) 104-125 g/d  Fluid:  (25-55ml/kg) 2600-3194ml/d  EDUCATION NEEDS:   No education needs identified at this time  [place]. Zenia Resides, [place], [place] (pager) Weekend/On-Call pager [phone])

## 2015-02-17 LAB — CREATININE, SERUM
CREATININE: 0.62 mg/dL (ref 0.61–1.24)
GFR calc Af Amer: >60 mL/min (ref >60)
GFR calc non Af Amer: >60 mL/min (ref >60)

## 2015-02-17 NOTE — Progress Notes (Signed)
7 Days Post-Op  Subjective: Some cramping abdominal gas pains. No nausea or vomiting he is tolerating a full liquid diet. He is passing gas. Continues drainage from the abdominal wall wound.  Objective: Vital signs in last 24 hours: Temp:  [97.7 F (36.5 C)-98.2 F (36.8 C)] 97.8 F (36.6 C) (01/11 0923) Pulse Rate:  [86-99] 99 (01/11 0923) Resp:  [10-20] 16 (01/11 0923) BP: (116-141)/(61-79) 116/61 mmHg (01/11 0923) SpO2:  [93 %-97 %] 93 % (01/11 0923) Last BM Date: 02/15/15  Intake/Output from previous day: 01/10 0701 - 01/11 0700 In: 2309 [P.O.:300; I.V.:2009] Out: 1950 [Urine:1950] Intake/Output this shift: Total I/O In: 968.8 [P.O.:570; I.V.:398.8] Out: 0   Physical exam:  Vital signs are reviewed he appears comfortable. Abdomen is soft and nontender somewhat distended minimally tympanitic the wound is showing no erythema no purulence there is serous drainage only and the sutures appear to be intact by probing. Calves are nontender.  Lab Results: CBC   Recent Labs  02/15/15 0456 02/16/15 0538  WBC 8.5 6.1  HGB 12.4* 11.1*  HCT 36.7* 32.3*  PLT 205 187   BMET  Recent Labs  02/15/15 0456 02/16/15 0538 02/17/15 0604  NA 135 132*  --   K 3.3* 3.0*  --   CL 94* 91*  --   CO2 35* 36*  --   GLUCOSE 121* 114*  --   BUN 11 7  --   CREATININE 0.66 0.61 0.62  CALCIUM 8.2* 7.9*  --    PT/INR No results for input(s): LABPROT, INR in the last 72 hours. ABG No results for input(s): PHART, HCO3 in the last 72 hours.  Invalid input(s): PCO2, PO2  Studies/Results: No results found.  Anti-infectives: Anti-infectives    Start     Dose/Rate Route Frequency Ordered Stop   02/10/15 0124  cefoTEtan (CEFOTAN) 2 g in dextrose 5 % 50 mL IVPB     2 g 100 mL/hr over 30 Minutes Intravenous On call to O.R. 02/10/15 0124 02/10/15 1317   02/10/15 0124  neomycin (MYCIFRADIN) tablet 1,000 mg  Status:  Discontinued     1,000 mg Oral 3 times per day 02/10/15 0124 02/10/15  1743   02/10/15 0124  erythromycin (E-MYCIN) tablet 1,000 mg  Status:  Discontinued     1,000 mg Oral 3 times per day 02/10/15 0124 02/10/15 1743      Assessment/Plan: s/p Procedure(s): LAPAROSCOPIC THEN OPEN RIGHT HEMI COLECTOMY   Patient is doing better today I see no sign of fascial dehiscence. We'll continue dry dressings and advance diet slowly  Florene Glen, MD, FACS  02/17/2015

## 2015-02-17 NOTE — Care Management (Signed)
Patient admitted with diagnosis of diagnosis of tubular adenoma of colon .  Status post hemicolectomy on 02/09/14.  Patient is listed as a self pay patient.  Patient states that he lives alone in section 8 housing, with no source of income.  Patient utilizes [employer] [facility] [place].  Patient obtains his medications from [place] [facility].  And [employer]  Patient ambulates with a cane.  States he has applied for Medical disability x3.  Patient uses the [employer] bus for transportation.  Patient states that he is a part of the [place] [facility] program.  He states that this program pays for his medical bills, and sends a nurse out to the house weekly, and also assists with transportation needs.  Patient if currently on acute O2.  Will follow for discharge planning.

## 2015-02-18 MED ORDER — MORPHINE SULFATE (PF) 2 MG/ML IV SOLN
2.0000 mg | INTRAVENOUS | Status: DC | PRN
  Administered 2015-02-18 – 2015-02-21 (×12): 2 mg via INTRAVENOUS
  Filled 2015-02-18 (×12): qty 1

## 2015-02-18 MED ORDER — OXYCODONE-ACETAMINOPHEN 5-325 MG PO TABS
1.0000 | ORAL_TABLET | ORAL | Status: DC | PRN
  Administered 2015-02-19 – 2015-02-22 (×10): 1 via ORAL
  Filled 2015-02-18 (×10): qty 1

## 2015-02-18 NOTE — Progress Notes (Signed)
8 Days Post-Op  Subjective: Patient has no abdominal complaints today still draining. His biggest complaint is malfunction of his IV during the night which kept him awake.  Objective: Vital signs in last 24 hours: Temp:  [97.7 F (36.5 C)-98.4 F (36.9 C)] 97.7 F (36.5 C) (01/12 0021) Pulse Rate:  [87-111] 87 (01/12 0535) Resp:  [13-18] 18 (01/12 0535) BP: (110-152)/(61-70) 120/64 mmHg (01/12 0535) SpO2:  [93 %-97 %] 95 % (01/12 0739) Last BM Date: 02/15/15  Intake/Output from previous day: 01/11 0701 - 01/12 0700 In: 3817.5 [P.O.:1410; I.V.:2407.5] Out: 2575 [Urine:2575] Intake/Output this shift:    Physical exam:  Abdomen is soft and minimally tender serous drainage no erythema no peritoneal signs. Sign of infection.  Lab Results: CBC   Recent Labs  02/16/15 0538  WBC 6.1  HGB 11.1*  HCT 32.3*  PLT 187   BMET  Recent Labs  02/16/15 0538 02/17/15 0604  NA 132*  --   K 3.0*  --   CL 91*  --   CO2 36*  --   GLUCOSE 114*  --   BUN 7  --   CREATININE 0.61 0.62  CALCIUM 7.9*  --    PT/INR No results for input(s): LABPROT, INR in the last 72 hours. ABG No results for input(s): PHART, HCO3 in the last 72 hours.  Invalid input(s): PCO2, PO2  Studies/Results: No results found.  Anti-infectives: Anti-infectives    Start     Dose/Rate Route Frequency Ordered Stop   02/10/15 0124  cefoTEtan (CEFOTAN) 2 g in dextrose 5 % 50 mL IVPB     2 g 100 mL/hr over 30 Minutes Intravenous On call to O.R. 02/10/15 0124 02/10/15 1317   02/10/15 0124  neomycin (MYCIFRADIN) tablet 1,000 mg  Status:  Discontinued     1,000 mg Oral 3 times per day 02/10/15 0124 02/10/15 1743   02/10/15 0124  erythromycin (E-MYCIN) tablet 1,000 mg  Status:  Discontinued     1,000 mg Oral 3 times per day 02/10/15 0124 02/10/15 1743      Assessment/Plan: s/p Procedure(s): LAPAROSCOPIC THEN OPEN RIGHT HEMI COLECTOMY   Patient is tolerating a regular diet I will stop his PCA and change  to oral plus IV morphine and saline lock his IV. He may be able to be discharged soon.  Florene Glen, MD, FACS  02/18/2015

## 2015-02-19 NOTE — Progress Notes (Signed)
9 Days Post-Op  Subjective: Minimal nausea and minimal pain this morning he is passing some gas but very minimal. Drainage from the wound is lessened.  Objective: Vital signs in last 24 hours: Temp:  [98 F (36.7 C)-98.7 F (37.1 C)] 98.1 F (36.7 C) (01/13 0453) Pulse Rate:  [93-99] 99 (01/13 0453) Resp:  [16-20] 16 (01/13 0453) BP: (120-149)/(69-86) 139/80 mmHg (01/13 0453) SpO2:  [91 %-94 %] 91 % (01/13 0453) Last BM Date: 02/15/15 (pt unsure)  Intake/Output from previous day: 01/12 0701 - 01/13 0700 In: 4316.2 [P.O.:720; I.V.:3596.2] Out: 2200 [Urine:2200] Intake/Output this shift: Total I/O In: -  Out: 550 [Urine:550]  Physical exam:  Abdomen is distended minimally tender minimally tympanitic wound is clean with no purulence or erythema but serous drainage is present but much less than previous exams.  Calves are nontender.  Lab Results: CBC  No results for input(s): WBC, HGB, HCT, PLT in the last 72 hours. BMET  Recent Labs  02/17/15 0604  CREATININE 0.62   PT/INR No results for input(s): LABPROT, INR in the last 72 hours. ABG No results for input(s): PHART, HCO3 in the last 72 hours.  Invalid input(s): PCO2, PO2  Studies/Results: No results found.  Anti-infectives: Anti-infectives    Start     Dose/Rate Route Frequency Ordered Stop   02/10/15 0124  cefoTEtan (CEFOTAN) 2 g in dextrose 5 % 50 mL IVPB     2 g 100 mL/hr over 30 Minutes Intravenous On call to O.R. 02/10/15 0124 02/10/15 1317   02/10/15 0124  neomycin (MYCIFRADIN) tablet 1,000 mg  Status:  Discontinued     1,000 mg Oral 3 times per day 02/10/15 0124 02/10/15 1743   02/10/15 0124  erythromycin (E-MYCIN) tablet 1,000 mg  Status:  Discontinued     1,000 mg Oral 3 times per day 02/10/15 0124 02/10/15 1743      Assessment/Plan: s/p Procedure(s): LAPAROSCOPIC THEN OPEN RIGHT HEMI COLECTOMY   Slow improvement of wound with lessening drainage. He is tolerating a regular diet at this  point. Recommend continued care for now with abdominal binder in hopes that this wound closes and stops draining.  Florene Glen, MD, FACS  02/19/2015

## 2015-02-19 NOTE — Progress Notes (Signed)
Nutrition Follow-up     INTERVENTION:  Meals and snacks: Cater to pt preferences   NUTRITION DIAGNOSIS:   Inadequate oral intake related to altered GI function as evidenced by NPO status (and clear liquids for 6 days), improved as on solid foods and tolerating    GOAL:   Patient will meet greater than or equal to 90% of their needs  Meeting goals  MONITOR:   PO intake  REASON FOR ASSESSMENT:   NPO/Clear Liquid Diet    ASSESSMENT:       Current Nutrition: Pt tolerating 100% of meals    Gastrointestinal Profile:noted distended abdomen, + gas Last BM: no BM   Scheduled Medications:  . albuterol  2.5 mg Nebulization BID  . enoxaparin (LOVENOX) injection  40 mg Subcutaneous Q24H    Continuous Medications:  . lactated ringers 125 mL/hr at 02/19/15 1224     Electrolyte/Renal Profile and Glucose Profile:   Recent Labs Lab 02/14/15 0501 02/15/15 0456 02/16/15 0538 02/17/15 0604  NA 137 135 132*  --   K 3.2* 3.3* 3.0*  --   CL 94* 94* 91*  --   CO2 34* 35* 36*  --   BUN 9 11 7   --   CREATININE 0.61 0.66 0.61 0.62  CALCIUM 8.5* 8.2* 7.9*  --   GLUCOSE 106* 121* 114*  --         Weight Trend since Admission: Filed Weights   02/10/15 1839  Weight: 230 lb (104.327 kg)      Diet Order:  DIET SOFT Room service appropriate?: Yes; Fluid consistency:: Thin  Skin:   reviewed  Height:   Ht Readings from Last 1 Encounters:  02/10/15 6\' 2"  (1.88 m)    Weight:   Wt Readings from Last 1 Encounters:  02/10/15 230 lb (104.327 kg)    Ideal Body Weight:     BMI:  Body mass index is 29.52 kg/(m^2).  Estimated Nutritional Needs:   Kcal:  BEE 1934 kcals (IF 1.0-1.2, AF 1.3) NE:9776110 kcals/d  Protein:  (1.0-1.2 g/kg) 104-125 g/d  Fluid:  (25-73ml/kg) 2600-3151ml/d  EDUCATION NEEDS:   No education needs identified at this time  LOW Care Level  Dominik Yordy B. Zenia Resides, [place], [place] (pager) Weekend/On-Call pager [phone])

## 2015-02-20 LAB — CBC WITH DIFFERENTIAL/PLATELET
BASOS PCT: 1 %
Basophils Absolute: 0.1 10*3/uL (ref 0–0.1)
Eosinophils Absolute: 0.1 10*3/uL (ref 0–0.7)
Eosinophils Relative: 2 %
HEMATOCRIT: 32 % — AB (ref 40.0–52.0)
HEMOGLOBIN: 10.8 g/dL — AB (ref 13.0–18.0)
LYMPHS ABS: 1 10*3/uL (ref 1.0–3.6)
LYMPHS PCT: 15 %
MCH: 32.2 pg (ref 26.0–34.0)
MCHC: 33.8 g/dL (ref 32.0–36.0)
MCV: 95.1 fL (ref 80.0–100.0)
MONOS PCT: 13 %
Monocytes Absolute: 0.8 10*3/uL (ref 0.2–1.0)
NEUTROS ABS: 4.3 10*3/uL (ref 1.4–6.5)
NEUTROS PCT: 69 %
Platelets: 300 10*3/uL (ref 150–440)
RBC: 3.37 MIL/uL — ABNORMAL LOW (ref 4.40–5.90)
RDW: 14 % (ref 11.5–14.5)
WBC: 6.3 10*3/uL (ref 3.8–10.6)

## 2015-02-20 LAB — COMPREHENSIVE METABOLIC PANEL
ALBUMIN: 2.5 g/dL — AB (ref 3.5–5.0)
ALK PHOS: 47 U/L (ref 38–126)
ALT: 32 U/L (ref 17–63)
ANION GAP: 7 (ref 5–15)
AST: 35 U/L (ref 15–41)
BILIRUBIN TOTAL: 0.5 mg/dL (ref 0.3–1.2)
BUN: 5 mg/dL — AB (ref 6–20)
CALCIUM: 8.2 mg/dL — AB (ref 8.9–10.3)
CO2: 32 mmol/L (ref 22–32)
Chloride: 98 mmol/L — ABNORMAL LOW (ref 101–111)
Creatinine, Ser: 0.65 mg/dL (ref 0.61–1.24)
GFR calc Af Amer: >60 mL/min (ref >60)
GFR calc non Af Amer: >60 mL/min (ref >60)
GLUCOSE: 110 mg/dL — AB (ref 65–99)
Potassium: 3.5 mmol/L (ref 3.5–5.1)
Sodium: 137 mmol/L (ref 135–145)
TOTAL PROTEIN: 5.3 g/dL — AB (ref 6.5–8.1)

## 2015-02-20 NOTE — Progress Notes (Signed)
10 Days Post-Op  Subjective: Patient had some pain last night what sounded like gas pains is now passing gas and having bowel movements his pain is resolved. Drainage has decreased.  Objective: Vital signs in last 24 hours: Temp:  [98.3 F (36.8 C)-98.6 F (37 C)] 98.3 F (36.8 C) (01/14 0646) Pulse Rate:  [87-109] 87 (01/14 0646) Resp:  [16-17] 16 (01/14 0646) BP: (125-135)/(71-82) 133/82 mmHg (01/14 0646) SpO2:  [90 %-94 %] 90 % (01/14 0646) Last BM Date: 02/20/15  Intake/Output from previous day: 01/13 0701 - 01/14 0700 In: 2892.9 [P.O.:120; I.V.:2772.9] Out: 1750 [Urine:1750] Intake/Output this shift:    Physical exam:  Soft nontender abdomen nondistended nontympanitic wound is showing no active drainage at this point but the dressings are saturated with serous fluid. Cannot express any fluid at this point  Lab Results: CBC   Recent Labs  02/20/15 0541  WBC 6.3  HGB 10.8*  HCT 32.0*  PLT 300   BMET  Recent Labs  02/20/15 0541  NA 137  K 3.5  CL 98*  CO2 32  GLUCOSE 110*  BUN 5*  CREATININE 0.65  CALCIUM 8.2*   PT/INR No results for input(s): LABPROT, INR in the last 72 hours. ABG No results for input(s): PHART, HCO3 in the last 72 hours.  Invalid input(s): PCO2, PO2  Studies/Results: No results found.  Anti-infectives: Anti-infectives    Start     Dose/Rate Route Frequency Ordered Stop   02/10/15 0124  cefoTEtan (CEFOTAN) 2 g in dextrose 5 % 50 mL IVPB     2 g 100 mL/hr over 30 Minutes Intravenous On call to O.R. 02/10/15 0124 02/10/15 1317   02/10/15 0124  neomycin (MYCIFRADIN) tablet 1,000 mg  Status:  Discontinued     1,000 mg Oral 3 times per day 02/10/15 0124 02/10/15 1743   02/10/15 0124  erythromycin (E-MYCIN) tablet 1,000 mg  Status:  Discontinued     1,000 mg Oral 3 times per day 02/10/15 0124 02/10/15 1743      Assessment/Plan: s/p Procedure(s): LAPAROSCOPIC THEN OPEN RIGHT HEMI COLECTOMY   Improved drainage pain has  resolved recommend advancing diet and possible discharge in the next day or [place], MD, FACS  02/20/2015

## 2015-02-21 NOTE — Progress Notes (Signed)
11 Days Post-Op  Subjective: Patient had gas pains last night which have resolved he had bowel movements again. He feels better.  Objective: Vital signs in last 24 hours: Temp:  [98.5 F (36.9 C)-98.9 F (37.2 C)] 98.8 F (37.1 C) (01/15 0514) Pulse Rate:  [98-115] 98 (01/15 0514) Resp:  [17-20] 18 (01/15 0514) BP: (137-154)/(73-86) 145/73 mmHg (01/15 0514) SpO2:  [93 %-96 %] 96 % (01/15 0514) Last BM Date: 02/20/15  Intake/Output from previous day: 01/14 0701 - 01/15 0700 In: 3435 [P.O.:480; I.V.:2955] Out: 1550 [Urine:1550] Intake/Output this shift:    Physical exam:  Abdomen is soft nontender wound shows no serous drainage except what is dried on the dressing from last night. No purulence  Lab Results: CBC   Recent Labs  02/20/15 0541  WBC 6.3  HGB 10.8*  HCT 32.0*  PLT 300   BMET  Recent Labs  02/20/15 0541  NA 137  K 3.5  CL 98*  CO2 32  GLUCOSE 110*  BUN 5*  CREATININE 0.65  CALCIUM 8.2*   PT/INR No results for input(s): LABPROT, INR in the last 72 hours. ABG No results for input(s): PHART, HCO3 in the last 72 hours.  Invalid input(s): PCO2, PO2  Studies/Results: No results found.  Anti-infectives: Anti-infectives    Start     Dose/Rate Route Frequency Ordered Stop   02/10/15 0124  cefoTEtan (CEFOTAN) 2 g in dextrose 5 % 50 mL IVPB     2 g 100 mL/hr over 30 Minutes Intravenous On call to O.R. 02/10/15 0124 02/10/15 1317   02/10/15 0124  neomycin (MYCIFRADIN) tablet 1,000 mg  Status:  Discontinued     1,000 mg Oral 3 times per day 02/10/15 0124 02/10/15 1743   02/10/15 0124  erythromycin (E-MYCIN) tablet 1,000 mg  Status:  Discontinued     1,000 mg Oral 3 times per day 02/10/15 0124 02/10/15 1743      Assessment/Plan: s/p Procedure(s): LAPAROSCOPIC THEN OPEN RIGHT HEMI COLECTOMY   Patient doing very well and drainage is decreasing considerably can probably go home soon I spoke with social services concerning home health for wound  care and that workup is in progress.  Florene Glen, MD, FACS  02/21/2015

## 2015-02-22 MED ORDER — OXYCODONE-ACETAMINOPHEN 7.5-325 MG PO TABS
1.0000 | ORAL_TABLET | ORAL | Status: DC | PRN
Start: 2015-02-22 — End: 2015-02-23
  Administered 2015-02-22 – 2015-02-23 (×3): 1 via ORAL
  Filled 2015-02-22 (×3): qty 1

## 2015-02-22 NOTE — Progress Notes (Signed)
12 Days Post-Op   Subjective:  Patient reports she's had a very good day. The gas pains he was having yesterday resolved early this morning and he has been mostly pain free throughout the day. Continues to be sore along his incisional site with some improvement from his oral pain medications. He states he oral medications do not last very long. Has had some drainage to his dressing. Tolerating his diet and having bowel function.  Vital signs in last 24 hours: Temp:  [97.7 F (36.5 C)-98 F (36.7 C)] 98 F (36.7 C) (01/16 1247) Pulse Rate:  [90-107] 107 (01/16 1247) Resp:  [17-18] 18 (01/16 1247) BP: (115-116)/(53-65) 115/65 mmHg (01/16 1247) SpO2:  [96 %] 96 % (01/16 1247) Last BM Date: 02/21/15  Intake/Output from previous day: 01/15 0701 - 01/16 0700 In: 480 [P.O.:480] Out: 600 [Urine:600]  Physical exam: Gen.: No acute distress  chest: Clear to auscultation Heart: Tachycardic GI: abdomen soft, appropriately tender to palpation along midline, staples intact over the majority of the incisions. Small area of opening in the lower midline from previous wound exploration. No spreading erythema, no active drainage however there is evidence of serous fluid on the dressing that was removed. No purulence.  Lab Results:  CBC  Recent Labs  02/20/15 0541  WBC 6.3  HGB 10.8*  HCT 32.0*  PLT 300   CMP     Component Value Date/Time   NA 137 02/20/2015 0541   K 3.5 02/20/2015 0541   CL 98* 02/20/2015 0541   CO2 32 02/20/2015 0541   GLUCOSE 110* 02/20/2015 0541   BUN 5* 02/20/2015 0541   CREATININE 0.65 02/20/2015 0541   CREATININE 0.86 12/16/2014 0844   CALCIUM 8.2* 02/20/2015 0541   PROT 5.3* 02/20/2015 0541   ALBUMIN 2.5* 02/20/2015 0541   AST 35 02/20/2015 0541   ALT 32 02/20/2015 0541   ALKPHOS 47 02/20/2015 0541   BILITOT 0.5 02/20/2015 0541   GFRNONAA >60 02/20/2015 0541   GFRNONAA >89 12/16/2014 0844   GFRAA >60 02/20/2015 0541   GFRAA >89 12/16/2014 0844    PT/INR No results for input(s): LABPROT, INR in the last 72 hours.  Studies/Results: No results found.  Assessment/Plan: 60 year old male 12 days status post laparoscopic converted open extended right hemicolectomy for colon cancer. Continues to improve. We'll advance diet to regular and provide stronger oral pain medications. Possible ready for discharge home in the next day or 2 if he continues to improve and home health can be arranged.   [name], MD FACS General Surgeon  02/22/2015

## 2015-02-23 MED ORDER — OXYCODONE-ACETAMINOPHEN 7.5-325 MG PO TABS: 1.0000 | ORAL_TABLET | ORAL | Status: DC | PRN

## 2015-02-23 NOTE — Progress Notes (Signed)
02/23/2015 12:48 PM  Erik [name] to be D/C'd Home per MD order.  Discussed prescriptions and follow up appointments with the patient. Prescriptions given to patient, medication list explained in detail. Pt verbalized understanding.    Medication List    TAKE these medications        albuterol 108 (90 Base) MCG/ACT inhaler  Commonly known as:  PROVENTIL HFA;VENTOLIN HFA  Inhale 2 puffs into the lungs every 6 (six) hours as needed for wheezing or shortness of breath.     amitriptyline 25 MG tablet  Commonly known as:  ELAVIL  TAKE ONE TABLET BY MOUTH AT BEDTIME.     dexlansoprazole 60 MG capsule  Commonly known as:  DEXILANT  Take 60 mg by mouth daily.     FLUoxetine 20 MG capsule  Commonly known as:  PROZAC  Take 20 mg by mouth daily.     oxyCODONE-acetaminophen 7.5-325 MG tablet  Commonly known as:  PERCOCET  Take 1 tablet by mouth every 4 (four) hours as needed for moderate pain.     rosuvastatin 40 MG tablet  Commonly known as:  CRESTOR  Take 1 tablet (40 mg total) by mouth daily.     sildenafil 100 MG tablet  Commonly known as:  VIAGRA  Take 0.5-1 tablets (50-100 mg total) by mouth daily as needed for erectile dysfunction.        Filed Vitals:   02/23/15 0900 02/23/15 1100  BP: 126/56 118/61  Pulse: 95 89  Temp: 98.1 F (36.7 C) 97.8 F (36.6 C)  Resp: 20 19    Skin clean, dry and intact without evidence of skin break down, no evidence of skin tears noted. IV catheter discontinued intact. Site without signs and symptoms of complications. Dressing and pressure applied. Pt denies pain at this time. No complaints noted.  An After Visit Summary was printed and given to the patient. Patient escorted via [place], and D/C home via private auto.  Dola Argyle

## 2015-02-23 NOTE — Progress Notes (Signed)
13 Days Post-Op   Subjective:  Patient did well overnight. He reports much less drainage from his midline wound. Tolerating his diet and continued to have bowel function.  Vital signs in last 24 hours: Temp:  [98 F (36.7 C)-98.2 F (36.8 C)] 98.2 F (36.8 C) (01/17 0502) Pulse Rate:  [84-107] 89 (01/17 0502) Resp:  [18-20] 20 (01/17 0502) BP: (115-140)/(65-83) 140/79 mmHg (01/17 0502) SpO2:  [96 %] 96 % (01/17 0502) Last BM Date: 02/22/15  Intake/Output from previous day: 01/16 0701 - 01/17 0700 In: 720 [P.O.:720] Out: -   GI: Abdomen is soft, appropriately tender to palpation along midline wound, minimal serous drainage present on the dressing without any active drainage during exam, no spreading erythema or signs of infection.  Lab Results:  CBC No results for input(s): WBC, HGB, HCT, PLT in the last 72 hours. CMP     Component Value Date/Time   NA 137 02/20/2015 0541   K 3.5 02/20/2015 0541   CL 98* 02/20/2015 0541   CO2 32 02/20/2015 0541   GLUCOSE 110* 02/20/2015 0541   BUN 5* 02/20/2015 0541   CREATININE 0.65 02/20/2015 0541   CREATININE 0.86 12/16/2014 0844   CALCIUM 8.2* 02/20/2015 0541   PROT 5.3* 02/20/2015 0541   ALBUMIN 2.5* 02/20/2015 0541   AST 35 02/20/2015 0541   ALT 32 02/20/2015 0541   ALKPHOS 47 02/20/2015 0541   BILITOT 0.5 02/20/2015 0541   GFRNONAA >60 02/20/2015 0541   GFRNONAA >89 12/16/2014 0844   GFRAA >60 02/20/2015 0541   GFRAA >89 12/16/2014 0844   PT/INR No results for input(s): LABPROT, INR in the last 72 hours.  Studies/Results: No results found.  Assessment/Plan: 60 year old male 13 days status post right extended hemicolectomy. Doing well. If continues to do well this morning and tolerates his regular diet would be okay for discharge home with home health for wound care.   [name], MD FACS General Surgeon  02/23/2015

## 2015-02-23 NOTE — Care Management (Addendum)
Patient to discharge today with home health RN and SW.  [facility] has accepted the patient as a charity case. Patient is in agreement to services.  Address and phone number  Verified.  Patient has questions regarding outpatient chemo follow up.  Barnabas Lister bedside RN to follow up with MD.   Patient contacting Catalina from [place] [facility] of [facility] to arrange discharge transportation.  RNCM signing off

## 2015-02-25 ENCOUNTER — Telehealth: Payer: Self-pay
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
## 2015-02-26 NOTE — Discharge Summary (Signed)
Physician Discharge Summary  Patient ID: Erik [name] MRN: [id number] DOB/AGE: 58-10-1955 58 y.o.  Admit date: 02/10/2015 Discharge date: 02/26/2015  Admission Diagnoses: Colon cancer  Discharge Diagnoses:  Active Problems:   Colon cancer [facility])   Discharged Condition: good  Hospital Course: 60 yr old with cecal mass, came in for Lap colon resection, found to have metastatic disease and converted to open procedure.  During his postoperative course, had to have wound opened at inferior portion and stitches placed to decrease ascitic leak.  Packing wound twice daily with damp to dry dressings.  Patient was up and moving about well, eating a regular diet, having good bowel movements and pain controlled with pain medications.  He had home health set up for help with dressing changes at home.   Consults: None   Treatments: surgery: Laparoscopic converted to open colon resection  Discharge Exam: Blood pressure 118/61, pulse 89, temperature 97.8 F (36.6 C), temperature source Oral, resp. rate 19, height 6\' 2"  (1.88 m), weight 230 lb (104.327 kg), SpO2 96 %. Please see note by my colleague for physical exam findings on 02/23/15  Disposition: 01-Home or Self Care  Discharge Instructions    Call MD for:  persistant nausea and vomiting    Complete by:  As directed      Call MD for:  redness, tenderness, or signs of infection (pain, swelling, redness, odor or green/yellow discharge around incision site)    Complete by:  As directed      Call MD for:  severe uncontrolled pain    Complete by:  As directed      Call MD for:  temperature >100.4    Complete by:  As directed      Diet - low sodium heart healthy    Complete by:  As directed      Discharge wound care:    Complete by:  As directed   Damp to dry dressings twice daily to midline wound     Driving Restrictions    Complete by:  As directed   No driving while on prescription pain medication     Increase activity slowly     Complete by:  As directed      Lifting restrictions    Complete by:  As directed   No lifting more than 15 lbs for 4 weeks     May shower / Bathe    Complete by:  As directed      May walk up steps    Complete by:  As directed             Medication List    TAKE these medications        albuterol 108 (90 Base) MCG/ACT inhaler  Commonly known as:  PROVENTIL HFA;VENTOLIN HFA  Inhale 2 puffs into the lungs every 6 (six) hours as needed for wheezing or shortness of breath.     amitriptyline 25 MG tablet  Commonly known as:  ELAVIL  TAKE ONE TABLET BY MOUTH AT BEDTIME.     dexlansoprazole 60 MG capsule  Commonly known as:  DEXILANT  Take 60 mg by mouth daily.     FLUoxetine 20 MG capsule  Commonly known as:  PROZAC  Take 20 mg by mouth daily.     oxyCODONE-acetaminophen 7.5-325 MG tablet  Commonly known as:  PERCOCET  Take 1 tablet by mouth every 4 (four) hours as needed for moderate pain.     rosuvastatin 40 MG tablet  Commonly known  as:  CRESTOR  Take 1 tablet (40 mg total) by mouth daily.     sildenafil 100 MG tablet  Commonly known as:  VIAGRA  Take 0.5-1 tablets (50-100 mg total) by mouth daily as needed for erectile dysfunction.           Follow-up Information    Follow up with Phoebe Perch, MD. Go on 03/08/2015.   Specialty:  Surgery   Why:  @11 :15am.  Bring DL and Insurance Card   Contact information:   [address] [place]  [id number] [phone]       Signed: Hubbard Robinson 02/26/2015, 7:25 PM
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
## 2015-03-03 ENCOUNTER — Telehealth: Payer: Self-pay

## 2015-03-03 ENCOUNTER — Ambulatory Visit (INDEPENDENT_AMBULATORY_CARE_PROVIDER_SITE_OTHER): Payer: Self-pay | Admitting: General Surgery

## 2015-03-03 ENCOUNTER — Encounter: Payer: Self-pay | Admitting: General Surgery

## 2015-03-03 ENCOUNTER — Other Ambulatory Visit: Payer: Self-pay | Admitting: Physician Assistant

## 2015-03-03 VITALS — BP 112/69 | HR 105 | Temp 97.9°F | Ht 74.0 in | Wt 223.0 lb

## 2015-03-03 DIAGNOSIS — Z4889 Encounter for other specified surgical aftercare: Secondary | ICD-10-CM

## 2015-03-03 MED ORDER — OXYCODONE-ACETAMINOPHEN 7.5-325 MG PO TABS: 1.0000 | ORAL_TABLET | ORAL | Status: DC | PRN

## 2015-03-03 MED ORDER — DEXLANSOPRAZOLE 60 MG PO CPDR: 60.0000 mg | DELAYED_RELEASE_CAPSULE | Freq: Every day | ORAL | Status: DC

## 2015-03-03 NOTE — Telephone Encounter (Signed)
Referral has been entered into EPIC.

## 2015-03-03 NOTE — Telephone Encounter (Signed)
Call made to [facility] center and left message for call back to make a follow-up appointment.  Call was returned at this time. Was asked to just place the referral to Dr. Whitney Muse in the computer and then they would take care of making the patient an appointment after reviewing his records. Unable to make patient an appointment at this time.  Please place referral and make appointment for patient.

## 2015-03-03 NOTE — Progress Notes (Signed)
Outpatient Surgical Follow Up  03/03/2015  Erik [name] is an 60 y.o. male.   Chief Complaint  Patient presents with  . Routine Post Op    HPI:  60 year old male returns to clinic 3 weeks status post extended right hemicolectomy. Has had minimal drainage from the midline. States that his pain is mostly controlled but still occasionally requiring pain medications. Denies any fevers, chills, nausea, vomiting, diarrhea, constipation. He's been very happy with his surgical care.  Past Medical History  Diagnosis Date  . Head trauma 2001    closed head injury; coma for 4 weeks  . Depression   . Abnormal stress echocardiogram   . Hypercholesterolemia   . Hypertension   . Alcohol abuse     Heavy Use up until 2010  . GERD (gastroesophageal reflux disease)   . PTSD (post-traumatic stress disorder)   . Anxiety     Past Surgical History  Procedure Laterality Date  . Kidney surgery      >30 years ago  . Cardiac cath    . Craniotomy  2001  . Head injury surgery    . Colonoscopy  2011    Dr. Oneida Alar: multiple adenomas and hyperplastic polyps  . Colonoscopy with propofol N/A 01/12/2015    Procedure: COLONOSCOPY WITH PROPOFOL;  Surgeon: Danie Binder, MD;  Location: [facility];  Service: Endoscopy;  Laterality: N/A;  1030  . Esophagogastroduodenoscopy (egd) with propofol N/A 01/12/2015    Procedure: ESOPHAGOGASTRODUODENOSCOPY (EGD) WITH PROPOFOL;  Surgeon: Danie Binder, MD;  Location: [facility];  Service: Endoscopy;  Laterality: N/A;  . Hernia repair Right 2012    Inguinal- [place] Na  . Laparoscopic right hemi colectomy Left 02/10/2015    Procedure: LAPAROSCOPIC THEN OPEN RIGHT HEMI COLECTOMY;  Surgeon: [name], MD;  Location: [facility] ORS;  Service: General;  Laterality: Left;    Family History  Problem Relation Age of Onset  . Ataxia Neg Hx   . Chorea Neg Hx   . Dementia Neg Hx   . Mental retardation Neg Hx   . Migraines Neg Hx   . Multiple sclerosis Neg Hx   .  Neurofibromatosis Neg Hx   . Neuropathy Neg Hx   . Parkinsonism Neg Hx   . Seizures Neg Hx   . Stroke Neg Hx   . Colon cancer Neg Hx   . Pulmonary embolism Mother   . Breast cancer Mother   . Heart disease Father   . Cancer Father 28    Leukemia  . Cancer Mother   . Cancer Paternal Grandfather     Lung    Social History:  reports that he has been smoking Cigarettes.  He has been smoking about 0.25 packs per day. He has never used smokeless tobacco. He reports that he drinks alcohol. He reports that he uses illicit drugs (Marijuana).  Allergies: No Known Allergies  Medications reviewed.    ROS  a multipoint review of systems was completed. All pertinent positives and negatives are documented within the history of present illness and the remainder were negative.   BP 112/69 mmHg  Pulse 105  Temp(Src) 97.9 F (36.6 C) (Oral)  Ht 6\' 2"  (1.88 m)  Wt 101.152 kg (223 lb)  BMI 28.62 kg/m2  Physical Exam   Gen.: No acute distress  chest: Clear to auscultation Heart: Tachycardic Abdomen: Soft, nontender, nondistended. Staples in place 2 surgical incisions. No evidence of erythema or drainage. The area of incision that is opened at the  bedside postoperatively is well approximated without any active drainage.   No results found for this or any previous visit (from the past 48 hour(s)). No results found.  Assessment/Plan:  1. Aftercare following surgery  60 year old male 3 weeks status post extended right hemicolectomy. Staples removed and replaced with Steri-Strips today. Discussed the signs and symptoms of infection or herniation with patient and return to clinic immediately should they occur. Otherwise patient will follow-up 1 additional time in 2 weeks for additional wound check and to discuss whether or not he needs a chemotherapy port placed for his colon cancer treatment.     [name], MD FACS General Surgeon  03/03/2015,3:25 PM

## 2015-03-04 ENCOUNTER — Encounter (HOSPITAL_COMMUNITY): Payer: Self-pay | Admitting: Oncology

## 2015-03-08 ENCOUNTER — Ambulatory Visit: Payer: Self-pay | Admitting: Surgery

## 2015-03-09 ENCOUNTER — Encounter (HOSPITAL_COMMUNITY): Payer: Self-pay | Admitting: Hematology & Oncology

## 2015-03-09 ENCOUNTER — Encounter (HOSPITAL_COMMUNITY): Payer: Medicaid Other | Attending: Hematology & Oncology | Admitting: Hematology & Oncology

## 2015-03-09 ENCOUNTER — Encounter: Payer: Self-pay | Admitting: *Deleted

## 2015-03-09 VITALS — BP 136/92 | HR 100 | Temp 98.1°F | Resp 20 | Ht 74.0 in | Wt 218.0 lb

## 2015-03-09 DIAGNOSIS — C172 Malignant neoplasm of ileum: Secondary | ICD-10-CM

## 2015-03-09 DIAGNOSIS — C184 Malignant neoplasm of transverse colon: Secondary | ICD-10-CM | POA: Diagnosis not present

## 2015-03-09 DIAGNOSIS — K2960 Other gastritis without bleeding: Secondary | ICD-10-CM

## 2015-03-09 LAB — COMPREHENSIVE METABOLIC PANEL
ALK PHOS: 66 U/L (ref 38–126)
ALT: 9 U/L — AB (ref 17–63)
AST: 17 U/L (ref 15–41)
Albumin: 3.7 g/dL (ref 3.5–5.0)
Anion gap: 11 (ref 5–15)
BILIRUBIN TOTAL: 0.6 mg/dL (ref 0.3–1.2)
BUN: 8 mg/dL (ref 6–20)
CHLORIDE: 98 mmol/L — AB (ref 101–111)
CO2: 27 mmol/L (ref 22–32)
CREATININE: 0.92 mg/dL (ref 0.61–1.24)
Calcium: 9.1 mg/dL (ref 8.9–10.3)
GFR calc Af Amer: >60 mL/min (ref >60)
GLUCOSE: 106 mg/dL — AB (ref 65–99)
POTASSIUM: 3.7 mmol/L (ref 3.5–5.1)
Sodium: 136 mmol/L (ref 135–145)
TOTAL PROTEIN: 7.5 g/dL (ref 6.5–8.1)

## 2015-03-09 LAB — CBC WITH DIFFERENTIAL/PLATELET
Basophils Absolute: 0.1 10*3/uL (ref 0.0–0.1)
Basophils Relative: 1 %
Eosinophils Absolute: 0.2 10*3/uL (ref 0.0–0.7)
Eosinophils Relative: 2 %
HCT: 43 % (ref 39.0–52.0)
HEMOGLOBIN: 14.4 g/dL (ref 13.0–17.0)
LYMPHS ABS: 2 10*3/uL (ref 0.7–4.0)
LYMPHS PCT: 24 %
MCH: 31.6 pg (ref 26.0–34.0)
MCHC: 33.5 g/dL (ref 30.0–36.0)
MCV: 94.5 fL (ref 78.0–100.0)
MONOS PCT: 9 %
Monocytes Absolute: 0.7 10*3/uL (ref 0.1–1.0)
NEUTROS PCT: 64 %
Neutro Abs: 5.4 10*3/uL (ref 1.7–7.7)
Platelets: 307 10*3/uL (ref 150–400)
RBC: 4.55 MIL/uL (ref 4.22–5.81)
RDW: 13.9 % (ref 11.5–15.5)
WBC: 8.3 10*3/uL (ref 4.0–10.5)

## 2015-03-09 LAB — IRON AND TIBC
IRON: 45 ug/dL (ref 45–182)
Saturation Ratios: 12 % — ABNORMAL LOW (ref 17.9–39.5)
TIBC: 361 ug/dL (ref 250–450)
UIBC: 316 ug/dL

## 2015-03-09 LAB — FOLATE: Folate: 8.8 ng/mL (ref >5.9)

## 2015-03-09 LAB — VITAMIN B12: Vitamin B-12: 149 pg/mL — ABNORMAL LOW (ref 180–914)

## 2015-03-09 LAB — FERRITIN: Ferritin: 52 ng/mL (ref 24–336)

## 2015-03-09 NOTE — Progress Notes (Signed)
[facility] Clinical Social Work  Clinical Social Work was referred by Fut[employer] trader for assessment of psychosocial needs due to new cancer diagnosis. Clinical Social Worker met with patient at [facility] to offer support and assess for needs.  CSW introduced self, explained role of CSW and provided pt with resource list. Pt reports to be well connected with local resources. He successfully uses [facility] and has a therapist he sees regularly. He informed therapist about new cancer diagnosis and feels well supported. Pt denied other needs and agrees to reach out as needed.   Clinical Social Work interventions: Resource education   Loren Racer, [place] Tuesdays   [phone]

## 2015-03-09 NOTE — Patient Instructions (Signed)
[place] at [facility] Discharge Instructions  RECOMMENDATIONS MADE BY THE CONSULTANT AND ANY TEST RESULTS WILL BE SENT TO YOUR REFERRING PHYSICIAN.     Exam and discussion by Dr Whitney Muse today  Blood work today  Chest CT scan scheduled  STAGE 3C COLON CANCER  With surgery alone the chance of it being cured is less than 50%  So we add chemotherapy.  The national guidelines for chemotherapy includes FOLFOX which includes leucovorin, oxaliplatin, and 5FU, this increases your cure rate to 85%.  This treatment is given every 2 weeks. You will get these drugs through a port-a-cath that we will get you set up with your surgeon to have placed for you.  Lupita Raider is our nurse navigator.  She will be the one that will be doing the teaching on the drugs.  She is a good resource or if you have any questions.   Return to see the doctor in 2 weeks   Please call the clinic if you have any questions or concerns      Thank you for choosing [place] at [facility] to provide your oncology and hematology care.  To afford each patient quality time with our provider, please arrive at least 15 minutes before your scheduled appointment time.   Beginning January 23rd 2017 lab work for the [facility] will be done in the  Main lab at [facility] on 1st floor. If you have a lab appointment with the [facility] please come in thru the  Main Entrance and check in at the main information desk  You need to re-schedule your appointment should you arrive 10 or more minutes late.  We strive to give you quality time with our providers, and arriving late affects you and other patients whose appointments are after yours.  Also, if you no show three or more times for appointments you may be dismissed from the clinic at the providers discretion.     Again, thank you for choosing [facility].  Our hope is that these requests will decrease the  amount of time that you wait before being seen by our physicians.       _____________________________________________________________  Should you have questions after your visit to [facility], please contact our office at [phone] between the hours of 8:30 a.m. and 4:30 p.m.  Voicemails left after 4:30 p.m. will not be returned until the following business day.  For prescription refill requests, have your pharmacy contact our office.       Leucovorin injection What is this medicine? LEUCOVORIN (loo koe VOR in) is used to prevent or treat the harmful effects of some medicines. This medicine is used to treat anemia caused by a low amount of folic acid in the body. It is also used with 5-fluorouracil (5-FU) to treat colon cancer. This medicine may be used for other purposes; ask your health care provider or pharmacist if you have questions. What should I tell my health care provider before I take this medicine? They need to know if you have any of these conditions: -anemia from low levels of vitamin B-12 in the blood -an unusual or allergic reaction to leucovorin, folic acid, other medicines, foods, dyes, or preservatives -pregnant or trying to get pregnant -breast-feeding How should I use this medicine? This medicine is for injection into a muscle or into a vein. It is given by a health care professional in a hospital or  clinic setting. Talk to your pediatrician regarding the use of this medicine in children. Special care may be needed. Overdosage: If you think you have taken too much of this medicine contact a poison control center or emergency room at once. NOTE: This medicine is only for you. Do not share this medicine with others. What if I miss a dose? This does not apply. What may interact with this medicine? -capecitabine -fluorouracil -phenobarbital -phenytoin -primidone -trimethoprim-sulfamethoxazole This list may not describe all possible interactions. Give  your health care provider a list of all the medicines, herbs, non-prescription drugs, or dietary supplements you use. Also tell them if you smoke, drink alcohol, or use illegal drugs. Some items may interact with your medicine. What should I watch for while using this medicine? Your condition will be monitored carefully while you are receiving this medicine. This medicine may increase the side effects of 5-fluorouracil, 5-FU. Tell your doctor or health care professional if you have diarrhea or mouth sores that do not get better or that get worse. What side effects may I notice from receiving this medicine? Side effects that you should report to your doctor or health care professional as soon as possible: -allergic reactions like skin rash, itching or hives, swelling of the face, lips, or tongue -breathing problems -fever, infection -mouth sores -unusual bleeding or bruising -unusually weak or tired Side effects that usually do not require medical attention (report to your doctor or health care professional if they continue or are bothersome): -constipation or diarrhea -loss of appetite -nausea, vomiting This list may not describe all possible side effects. Call your doctor for medical advice about side effects. You may report side effects to FDA at 1-800-FDA-1088. Where should I keep my medicine? This drug is given in a hospital or clinic and will not be stored at home. NOTE: This sheet is a summary. It may not cover all possible information. If you have questions about this medicine, talk to your doctor, pharmacist, or health care provider.    2016, Elsevier/Gold Standard. (2007-07-30 16:50:29)     Oxaliplatin Injection What is this medicine? OXALIPLATIN (ox AL i PLA tin) is a chemotherapy drug. It targets fast dividing cells, like cancer cells, and causes these cells to die. This medicine is used to treat cancers of the colon and rectum, and many other cancers. This medicine may be  used for other purposes; ask your health care provider or pharmacist if you have questions. What should I tell my health care provider before I take this medicine? They need to know if you have any of these conditions: -kidney disease -an unusual or allergic reaction to oxaliplatin, other chemotherapy, other medicines, foods, dyes, or preservatives -pregnant or trying to get pregnant -breast-feeding How should I use this medicine? This drug is given as an infusion into a vein. It is administered in a hospital or clinic by a specially trained health care professional. Talk to your pediatrician regarding the use of this medicine in children. Special care may be needed. Overdosage: If you think you have taken too much of this medicine contact a poison control center or emergency room at once. NOTE: This medicine is only for you. Do not share this medicine with others. What if I miss a dose? It is important not to miss a dose. Call your doctor or health care professional if you are unable to keep an appointment. What may interact with this medicine? -medicines to increase blood counts like filgrastim, pegfilgrastim, sargramostim -probenecid -some antibiotics  like amikacin, gentamicin, neomycin, polymyxin B, streptomycin, tobramycin -zalcitabine Talk to your doctor or health care professional before taking any of these medicines: -acetaminophen -aspirin -ibuprofen -ketoprofen -naproxen This list may not describe all possible interactions. Give your health care provider a list of all the medicines, herbs, non-prescription drugs, or dietary supplements you use. Also tell them if you smoke, drink alcohol, or use illegal drugs. Some items may interact with your medicine. What should I watch for while using this medicine? Your condition will be monitored carefully while you are receiving this medicine. You will need important blood work done while you are taking this medicine. This medicine can  make you more sensitive to cold. Do not drink cold drinks or use ice. Cover exposed skin before coming in contact with cold temperatures or cold objects. When out in cold weather wear warm clothing and cover your mouth and nose to warm the air that goes into your lungs. Tell your doctor if you get sensitive to the cold. This drug may make you feel generally unwell. This is not uncommon, as chemotherapy can affect healthy cells as well as cancer cells. Report any side effects. Continue your course of treatment even though you feel ill unless your doctor tells you to stop. In some cases, you may be given additional medicines to help with side effects. Follow all directions for their use. Call your doctor or health care professional for advice if you get a fever, chills or sore throat, or other symptoms of a cold or flu. Do not treat yourself. This drug decreases your body's ability to fight infections. Try to avoid being around people who are sick. This medicine may increase your risk to bruise or bleed. Call your doctor or health care professional if you notice any unusual bleeding. Be careful brushing and flossing your teeth or using a toothpick because you may get an infection or bleed more easily. If you have any dental work done, tell your dentist you are receiving this medicine. Avoid taking products that contain aspirin, acetaminophen, ibuprofen, naproxen, or ketoprofen unless instructed by your doctor. These medicines may hide a fever. Do not become pregnant while taking this medicine. Women should inform their doctor if they wish to become pregnant or think they might be pregnant. There is a potential for serious side effects to an unborn child. Talk to your health care professional or pharmacist for more information. Do not breast-feed an infant while taking this medicine. Call your doctor or health care professional if you get diarrhea. Do not treat yourself. What side effects may I notice from  receiving this medicine? Side effects that you should report to your doctor or health care professional as soon as possible: -allergic reactions like skin rash, itching or hives, swelling of the face, lips, or tongue -low blood counts - This drug may decrease the number of white blood cells, red blood cells and platelets. You may be at increased risk for infections and bleeding. -signs of infection - fever or chills, cough, sore throat, pain or difficulty passing urine -signs of decreased platelets or bleeding - bruising, pinpoint red spots on the skin, black, tarry stools, nosebleeds -signs of decreased red blood cells - unusually weak or tired, fainting spells, lightheadedness -breathing problems -chest pain, pressure -cough -diarrhea -jaw tightness -mouth sores -nausea and vomiting -pain, swelling, redness or irritation at the injection site -pain, tingling, numbness in the hands or feet -problems with balance, talking, walking -redness, blistering, peeling or loosening of the skin,  including inside the mouth -trouble passing urine or change in the amount of urine Side effects that usually do not require medical attention (report to your doctor or health care professional if they continue or are bothersome): -changes in vision -constipation -hair loss -loss of appetite -metallic taste in the mouth or changes in taste -stomach pain This list may not describe all possible side effects. Call your doctor for medical advice about side effects. You may report side effects to FDA at 1-800-FDA-1088. Where should I keep my medicine? This drug is given in a hospital or clinic and will not be stored at home. NOTE: This sheet is a summary. It may not cover all possible information. If you have questions about this medicine, talk to your doctor, pharmacist, or health care provider.    2016, Elsevier/Gold Standard. (2007-08-20 17:22:47)    Fluorouracil, 5-FU injection What is this  medicine? FLUOROURACIL, 5-FU (flure oh YOOR a sil) is a chemotherapy drug. It slows the growth of cancer cells. This medicine is used to treat many types of cancer like breast cancer, colon or rectal cancer, pancreatic cancer, and stomach cancer. This medicine may be used for other purposes; ask your health care provider or pharmacist if you have questions. What should I tell my health care provider before I take this medicine? They need to know if you have any of these conditions: -blood disorders -dihydropyrimidine dehydrogenase (DPD) deficiency -infection (especially a virus infection such as chickenpox, cold sores, or herpes) -kidney disease -liver disease -malnourished, poor nutrition -recent or ongoing radiation therapy -an unusual or allergic reaction to fluorouracil, other chemotherapy, other medicines, foods, dyes, or preservatives -pregnant or trying to get pregnant -breast-feeding How should I use this medicine? This drug is given as an infusion or injection into a vein. It is administered in a hospital or clinic by a specially trained health care professional. Talk to your pediatrician regarding the use of this medicine in children. Special care may be needed. Overdosage: If you think you have taken too much of this medicine contact a poison control center or emergency room at once. NOTE: This medicine is only for you. Do not share this medicine with others. What if I miss a dose? It is important not to miss your dose. Call your doctor or health care professional if you are unable to keep an appointment. What may interact with this medicine? -allopurinol -cimetidine -dapsone -digoxin -hydroxyurea -leucovorin -levamisole -medicines for seizures like ethotoin, fosphenytoin, phenytoin -medicines to increase blood counts like filgrastim, pegfilgrastim, sargramostim -medicines that treat or prevent blood clots like warfarin, enoxaparin, and  dalteparin -methotrexate -metronidazole -pyrimethamine -some other chemotherapy drugs like busulfan, cisplatin, estramustine, vinblastine -trimethoprim -trimetrexate -vaccines Talk to your doctor or health care professional before taking any of these medicines: -acetaminophen -aspirin -ibuprofen -ketoprofen -naproxen This list may not describe all possible interactions. Give your health care provider a list of all the medicines, herbs, non-prescription drugs, or dietary supplements you use. Also tell them if you smoke, drink alcohol, or use illegal drugs. Some items may interact with your medicine. What should I watch for while using this medicine? Visit your doctor for checks on your progress. This drug may make you feel generally unwell. This is not uncommon, as chemotherapy can affect healthy cells as well as cancer cells. Report any side effects. Continue your course of treatment even though you feel ill unless your doctor tells you to stop. In some cases, you may be given additional medicines to help with side  effects. Follow all directions for their use. Call your doctor or health care professional for advice if you get a fever, chills or sore throat, or other symptoms of a cold or flu. Do not treat yourself. This drug decreases your body's ability to fight infections. Try to avoid being around people who are sick. This medicine may increase your risk to bruise or bleed. Call your doctor or health care professional if you notice any unusual bleeding. Be careful brushing and flossing your teeth or using a toothpick because you may get an infection or bleed more easily. If you have any dental work done, tell your dentist you are receiving this medicine. Avoid taking products that contain aspirin, acetaminophen, ibuprofen, naproxen, or ketoprofen unless instructed by your doctor. These medicines may hide a fever. Do not become pregnant while taking this medicine. Women should inform their  doctor if they wish to become pregnant or think they might be pregnant. There is a potential for serious side effects to an unborn child. Talk to your health care professional or pharmacist for more information. Do not breast-feed an infant while taking this medicine. Men should inform their doctor if they wish to father a child. This medicine may lower sperm counts. Do not treat diarrhea with over the counter products. Contact your doctor if you have diarrhea that lasts more than 2 days or if it is severe and watery. This medicine can make you more sensitive to the sun. Keep out of the sun. If you cannot avoid being in the sun, wear protective clothing and use sunscreen. Do not use sun lamps or tanning beds/booths. What side effects may I notice from receiving this medicine? Side effects that you should report to your doctor or health care professional as soon as possible: -allergic reactions like skin rash, itching or hives, swelling of the face, lips, or tongue -low blood counts - this medicine may decrease the number of white blood cells, red blood cells and platelets. You may be at increased risk for infections and bleeding. -signs of infection - fever or chills, cough, sore throat, pain or difficulty passing urine -signs of decreased platelets or bleeding - bruising, pinpoint red spots on the skin, black, tarry stools, blood in the urine -signs of decreased red blood cells - unusually weak or tired, fainting spells, lightheadedness -breathing problems -changes in vision -chest pain -mouth sores -nausea and vomiting -pain, swelling, redness at site where injected -pain, tingling, numbness in the hands or feet -redness, swelling, or sores on hands or feet -stomach pain -unusual bleeding Side effects that usually do not require medical attention (report to your doctor or health care professional if they continue or are bothersome): -changes in finger or toe nails -diarrhea -dry or itchy  skin -hair loss -headache -loss of appetite -sensitivity of eyes to the light -stomach upset -unusually teary eyes This list may not describe all possible side effects. Call your doctor for medical advice about side effects. You may report side effects to FDA at 1-800-FDA-1088. Where should I keep my medicine? This drug is given in a hospital or clinic and will not be stored at home. NOTE: This sheet is a summary. It may not cover all possible information. If you have questions about this medicine, talk to your doctor, pharmacist, or health care provider.    2016, Elsevier/Gold Standard. (2007-05-29 13:53:16)

## 2015-03-09 NOTE — Progress Notes (Signed)
[facility] NOTE  Patient Care Team: Erik Dryer, PA-C as PCP - General (Physician Assistant) Erik Binder, MD as Consulting Physician (Gastroenterology)  CHIEF COMPLAINTS/PURPOSE OF CONSULTATION:  [phone] adenocarcinoma of the terminal ileum, Stage III Peri-neural and LVI noted, 4/19 nodes involved Colonoscopy on 01/12/2015 with Dr Erik [name] showing mass at the splenic flexure EGD on 01/12/2015 with moderate erosive gastritis from ASA use CEA 01/12/2015 at 6.6 ng/ml Extended R hemicolectomy with Dr. Adonis [name] on 02/10/2015   HISTORY OF PRESENTING ILLNESS:  Erik [name] 60 y.o. male is here on referral from Dr. Adonis [name] due to recently discovered colon cancer. His surgery was the 4th.  He notes that his cancer was discovered due to follow-up on his severe acid reflux, for which he'd been being treated for 2-3 years. His GI doctor is Dr. Oneida [name]. He had a colonoscopy 4 years ago, and notes that he was supposed to go back for a second colonoscopy in 3 years, but didn't. One one of his GI follow ups, they noticed he was a year late for his colonoscopy, and scheduled him for a simultaneous colonoscopy and EGD. He says that during this procedure, they found a mass; they did a biopsy on that; they also biopsied his stomach "a couple of other places," which all came back negative except for the one mass. He says they were going to do a laproscopic study, "but as soon as they got in there, they said it was a whole lot worse theyn they thought." They took the mass out, and, according to Erik [name], "dsicovered it had spread to 12 of his lymph nodes and to another lower place." He says they took all of that out and said he has Stage 3, borderline 4 colon cancer. He notes that, during his surgery, they also discovered that he'd had appendicitis about 5-6 years ago, without his knowledge. This healed over on its own with a lot of scar tissue. He says "they had to take it  all out, too." He notes that he had hernia surgery around the same time as the apparent appendicitis.  He ended up at [facility] for his surgery because he didn't have any insurance elsewhere.  Erik [name] notes that he is receiving help with packing and dressing his surgical site from a neighbor. He comments that the packing isn't that deep, and that it's on the lower right side of his abdomen.  He says that his appetite is good, but that after 3-4 bites, he doesn't want anymore. He says he hasn't tried carnation instant breakfast, and doesn't like boost/ensure. He believes he can tolerate it, but says "it wasn't too good." He comments "I didn't feel like it was doing too much, so I didn't stick with it." He says "I don't know if I'll ever be where I can eat, remarking once more that he still has an appetite, and even wakes up hungry, but after 3 to 4 bites he doesn't want any more. He says he eats about 4-5 times a day like this.  When asked about his bowels, he says that they are all liquid, "pretty much pure liquid, all the time." He says that it's been that way ever since he had the colonoscopy, and hasn't gotten back to normal.  He currently denies abdominal pain, nausea or vomiting. He is here today to discuss his recent diagnosis and any recommended additional therapy.    Adenocarcinoma of transverse colon (Erik [name])   01/12/2015  Pathologic Stage Colon, biopsy, distal transverse - TUBULOVILLOUS ADENOMA WITH HIGH GRADE DYSPLASIA.   01/12/2015 Procedure Colonoscopy by Dr. Oneida [name].   01/12/2015 Tumor Marker CEA: 6.6 (H)    01/18/2015 Imaging CT abd/pelvis- Apple-core lesion identified in the mid transverse colon without obstruction. No evidence for lymphadenopathy in the gastrohepatic ligament or omentum.  Stable 8 mm hypo attenuating lesion in the left liver, likely a cyst.   02/10/2015 Initial Diagnosis Adenocarcinoma of transverse colon (Erik [name])   02/12/2015 Definitive Surgery Erik [name], Extended  right hemicolectomy    02/12/2015 Pathology Results Mucinous adenocarcinoma with penetration of visceral peritoneum, 4/19 lymph nodes for metasattic disease, negative resection margins, with LVI and perineural invasion     MEDICAL HISTORY:  Past Medical History  Diagnosis Date  . Head trauma 2001    closed head injury; coma for 4 weeks  . Depression   . Abnormal stress echocardiogram   . Hypercholesterolemia   . Hypertension   . Alcohol abuse     Heavy Use up until 2010  . GERD (gastroesophageal reflux disease)   . PTSD (post-traumatic stress disorder)   . Anxiety   . Adenocarcinoma of transverse colon (Erik [name]) 02/10/2015   Ended up with the cane due to severe brain trauma and head injury in '01 Fell off of a piece of equipment at work and his head was the first thing to hit the concrete floor No one knows the origin of the fall He was in a coma for four and a half weeks Permanent balance/equilibrium problem as a result Carries the cane so that if he starts to lose his balance, he won't fall Used to fall pretty frequently; had 2-3 concussions from blacking out and falling against brick walls or concrete  Last year to year and a half, he says it's gotten better Hasn't blacked out, but does fall every once in a while Says this mostly happens after he's been sitting or laying for a while  Example: If he gets up to use the bathroom in the middle of the night, he will stumble and fall against the wall He denies any problems with neuropathy, saying he will get dizzy in his head He says sometimes he knows when it's coming on, and sometimes he won't His vision gets a little blurry too Works with neurologist Dr. Susa Raring at [facility] in [place]: Past Surgical History  Procedure Laterality Date  . Kidney surgery      >30 years ago  . Cardiac cath    . Craniotomy  2001  . Head injury surgery    . Colonoscopy  2011    Dr. Oneida [name]: multiple adenomas and hyperplastic  polyps  . Colonoscopy with propofol N/A 01/12/2015    Procedure: COLONOSCOPY WITH PROPOFOL;  Surgeon: Erik Binder, MD;  Location: [facility];  Service: Endoscopy;  Laterality: N/A;  1030  . Esophagogastroduodenoscopy (egd) with propofol N/A 01/12/2015    Procedure: ESOPHAGOGASTRODUODENOSCOPY (EGD) WITH PROPOFOL;  Surgeon: Erik Binder, MD;  Location: [facility];  Service: Endoscopy;  Laterality: N/A;  . Hernia repair Right 2012    Inguinal- Forestine Na  . Laparoscopic right hemi colectomy Left 02/10/2015    Procedure: LAPAROSCOPIC THEN OPEN RIGHT HEMI COLECTOMY;  Surgeon: Erik Pert, MD;  Location: [facility];  Service: General;  Laterality: Left;    SOCIAL HISTORY: Social History   Social History  . Marital Status: Divorced    Spouse Name: N/A  . Number of Children: N/A  .  Years of Education: N/A   Occupational History  . Full time    Social History Main Topics  . Smoking status: Current Every Day Smoker -- 0.25 packs/day    Types: Cigarettes  . Smokeless tobacco: Never Used     Comment: patient is aware he needs to quit  . Alcohol Use: 0.0 oz/week    0 Standard drinks or equivalent per week     Comment: beer occ, history of ETOH abuse in remote past.   . Drug Use: Yes    Special: Marijuana     Comment: marijuana last 2012  . Sexual Activity:    Partners: Female   Other Topics Concern  . 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 Ataxia Neg Hx   . Chorea Neg Hx   . Dementia Neg Hx   . Mental retardation Neg Hx   . Migraines Neg Hx   . Multiple sclerosis Neg Hx   . Neurofibromatosis Neg Hx   . Neuropathy Neg Hx   . Parkinsonism Neg Hx   . Seizures Neg Hx   . Stroke Neg Hx   . Colon cancer Neg Hx   . Pulmonary embolism Mother   . Breast cancer Mother   . Heart disease Father   . Cancer Father 69    Leukemia  . Cancer Mother   . Cancer Paternal Grandfather     Lung   indicated that his mother is deceased. He indicated that his father is deceased.   Mother and father both deceased Mother was 45; died of blood clots in her legs; pulmonary embolism Had a hip replacement and "a bunch of other stuff;" says it's a long complicated story Says "a woman got power of attorney over his mother and they stopped her physical therapy and put her in a nursing home, and due to her inactivity she got blood clots" Father ws 35, died of heart failure No siblings  ALLERGIES:  has No Known Allergies.  MEDICATIONS:  Current Outpatient Prescriptions  Medication Sig Dispense Refill  . albuterol (PROVENTIL HFA;VENTOLIN HFA) 108 (90 BASE) MCG/ACT inhaler Inhale 2 puffs into the lungs every 6 (six) hours as needed for wheezing or shortness of breath. 1 Inhaler 1  . amitriptyline (ELAVIL) 25 MG tablet TAKE ONE TABLET BY MOUTH AT BEDTIME. (Patient taking differently: TAKE ONE TABLET BY MOUTH AT BEDTIME. prn) 30 tablet 2  . dexlansoprazole (DEXILANT) 60 MG capsule Take 1 capsule (60 mg total) by mouth daily. 90 capsule 1  . FLUoxetine (PROZAC) 20 MG capsule Take 20 mg by mouth daily.    Marland Kitchen oxyCODONE-acetaminophen (PERCOCET) 7.5-325 MG tablet Take 1 tablet by mouth every 4 (four) hours as needed for moderate pain. 20 tablet 0  . rosuvastatin (CRESTOR) 40 MG tablet Take 1  tablet (40 mg total) by mouth  daily. 90 tablet 1  . sildenafil (VIAGRA) 100 MG tablet Take 0.5-1 tablets (50-100 mg total) by mouth daily as needed for erectile dysfunction. 10 tablet 1   No current facility-administered medications for this visit.    Review of Systems  Constitutional: Negative.  Negative for fever, chills, weight loss and malaise/fatigue.       Recovering from recent abdominal surgery for his colon cancer. Walks with cane due to traumatic head injury in the past.  HENT: Negative.  Negative for congestion, hearing loss, nosebleeds, sore throat and tinnitus.   Eyes: Negative.  Negative for blurred vision, double vision, pain and discharge.  Respiratory: Negative.  Negative for cough, hemoptysis, sputum production, shortness of breath and wheezing.   Cardiovascular: Negative.  Negative for chest pain, palpitations, claudication, leg swelling and PND.  Gastrointestinal: Positive for diarrhea. Negative for heartburn, nausea, vomiting, abdominal pain, constipation, blood in stool and melena.  Genitourinary: Negative.  Negative for dysuria, urgency, frequency and hematuria.  Musculoskeletal: Positive for falls. Negative for myalgias and joint pain.       Due to traumatic head injury in the past.  Skin: Negative.  Negative for itching and rash.  Neurological: Negative.  Negative for dizziness, tingling, tremors, sensory change, speech change, focal weakness, seizures, loss of consciousness, weakness and headaches.       Traumatic head injury in the past.  Endo/Heme/Allergies: Negative.  Does not bruise/bleed easily.  Psychiatric/Behavioral: Negative.  Negative for depression, suicidal ideas, memory loss and substance abuse. The patient is not nervous/anxious and does not have insomnia.   All other systems reviewed and are negative.  14 point ROS was done and is otherwise as detailed above or in HPI   PHYSICAL EXAMINATION: ECOG PERFORMANCE STATUS: 0 - Asymptomatic  Filed Vitals:   03/09/15 1140  BP:  136/92  Pulse: 100  Temp: 98.1 F (36.7 C)  Resp: 20   Filed Weights   03/09/15 1140  Weight: 218 lb (98.884 kg)     Physical Exam  Constitutional: He is oriented to person, place, and time and well-developed, well-nourished, and in no distress.  HENT:  Head: Normocephalic and atraumatic.  Nose: Nose normal.  Mouth/Throat: Oropharynx is clear and moist. No oropharyngeal exudate.  Eyes: Conjunctivae and EOM are normal. Pupils are equal, round, and reactive to light. Right eye exhibits no discharge. Left eye exhibits no discharge. No scleral icterus.  Neck: Normal range of motion. Neck supple. No tracheal deviation present. No thyromegaly present.  Cardiovascular: Normal rate, regular rhythm and normal heart sounds.  Exam reveals no gallop and no friction rub.   No murmur heard. Pulmonary/Chest: Effort normal and breath sounds normal. He has no wheezes. He has no rales.  Abdominal: Soft. Bowel sounds are normal. He exhibits no distension and no mass. There is no tenderness. There is no rebound and no guarding.  Surgical incision site on abdomen examined very small area less than 1 cm, with packing, superficial. Minimal to no erythema, no d/c  Musculoskeletal: Normal range of motion. He exhibits no edema.  Lymphadenopathy:    He has no cervical adenopathy.  Neurological: He is alert and oriented to person, place, and time. No cranial nerve deficit. Coordination normal.  Uses cane  Skin: Skin is warm and dry. No rash noted.  Psychiatric: Mood, memory, affect and judgment normal.  Nursing note and vitals reviewed.     LABORATORY DATA:  I have reviewed the data as listed Lab  Results  Component Value Date   WBC 8.3 03/09/2015   HGB 14.4 03/09/2015   HCT 43.0 03/09/2015   MCV 94.5 03/09/2015   PLT 307 03/09/2015   CMP     Component Value Date/Time   NA 136 03/09/2015 1300   K 3.7 03/09/2015 1300   CL 98* 03/09/2015 1300   CO2 27 03/09/2015 1300   GLUCOSE 106* 03/09/2015  1300   BUN 8 03/09/2015 1300   CREATININE 0.92 03/09/2015 1300   CREATININE 0.86 12/16/2014 0844   CALCIUM 9.1 03/09/2015 1300   PROT 7.5 03/09/2015 1300   ALBUMIN 3.7 03/09/2015 1300   AST 17 03/09/2015 1300   ALT 9* 03/09/2015 1300   ALKPHOS 66 03/09/2015 1300   BILITOT 0.6 03/09/2015 1300   GFRNONAA >60 03/09/2015 1300   GFRNONAA >89 12/16/2014 0844   GFRAA >60 03/09/2015 1300   GFRAA >89 12/16/2014 0844   PATHOLOGY Surgical Pathology  CASE: [id number]  PATIENT: Erik [name]  Surgical Pathology Report   SPECIMEN SUBMITTED:  A. Peritoneal biopsy  B. Extended hemicolectomy, right   CLINICAL HISTORY:  Potential metastatic colon cancer; Tubular adenoma of colon   PRE-OPERATIVE DIAGNOSIS:    POST-OPERATIVE DIAGNOSIS:    DIAGNOSIS:  A. PERITONEUM; BIOPSY:  - FIBROADIPOSE TISSUE WITH INFLAMMATION.   B. RIGHT COLON; EXTENDED RIGHT HEMICOLECTOMY:  - MUCINOUS ADENOCARCINOMA WITH PENETRATION OF THE VISCERAL PERITONEUM.  - SEPARATE TUBULAR ADENOMA OF THE CECUM.  - APPENDIX WITHIN NORMAL LIMITS.  - FOUR OF NINETEEN LYMPH NODES INVOLVED BY METASTATIC CARCINOMA (4/19).  - THE MARGINS OF RESECTION ARE NEGATIVE.  - SEE SUMMARY BELOW.   Note:  Ample material is available for MSI and other ancillary testing.    COLON AND RECTUM: Resection, Including Transanal Disk Excision of Rectal  Neoplasms  Specimens InvolvedB: Extended hemicolectomy, right   Colon and Rectum, Resection Cancer Case Summary  SPECIMEN  Specimen: Terminal ileum  Cecum  Appendix  Ascending colon  Procedure:   Right hemicolectomy  Primary Tumor Site: Right (ascending) colon  Additional Sites Involved by Tumor:   None identified  Macroscopic Tumor Perforation:   Cannot be determined  unspecified  TUMOR  Histologic Type:  Mucinous adenocarcinoma  Histologic Grade:  Other (specify)  Defer to optional MSI testing for grade assignment: Mucinous  adenocarcinoma is no longer considered  definitionally high grade. MSI-H  carcinomas are low grade while MSS or MSI-L carcinomas are high grade  EXTENT  Tumor Size:  Greatest dimension (cm)  4.8cm  Microscopic Tumor Extension: Tumor penetrates to the surface of the  visceral peritoneum (serosa)  MARGINS  Proximal Margin:  Uninvolved by invasive carcinoma  - No adenoma or intraepithelial neoplasia / dysplasia identified  Distal Margin: Uninvolved by invasive carcinoma  - No adenoma or intraepithelial neoplasia / dysplasia identified  Circumferential (Radial) or Mesenteric Margin:  Uninvolved by invasive  carcinoma  Deep Margin:  n/a  ACCESSORY FINDINGS  Lymph-Vascular Invasion: Present  Perineural Invasion:   Present  Tumor Deposits:   Not identified  not identified  STAGE (pTNM)  TNM Descriptors:  n/a  Primary Tumor (pT): pT4a: Tumor penetrates the visceral peritoneum  Regional Lymph Nodes (pN)  pN2a: Metastasis in 4 to 6 regional lymph nodes  Number of Lymph Nodes Examined:  Specify  19  Number of Lymph Nodes Involved:  Specify  4  Distant Metastasis (pM): Not applicable    GROSS DESCRIPTION:  A. Intra Operative Consultation:    Received: Fresh    Specimen: Peritoneal biopsy  Pathologic Evaluation: Frozen section    Diagnosis: Frozen section A1 Peritoneal Biopsy: Grossly fatty  specimen. Fibrosis and inflammation. No carcinoma seen.    Communicated to: Dr. Adonis [name] at 2:33 PM on 02/10/2015 by Dr. Luana Shu    Tissue submitted: A1   Labeled: Peritoneal biopsy   Tissue fragment(s): 1   Size: 1.5 x 0.6 x 0.3 cm   Description: Piece of red yellow adipose tissue, sectioned   Entirely submitted in one cassette(s).    B. Labeled: Extended right hemicolectomy  Type of procedure: Right hemicolectomy  Measurement: Terminal ileum 7 cm in length by 5.5 cm in circumference,  appendix 5 cm in length and from 0.5-0.7 cm in diameter, cecum and right  colon measure 33.5 cm in length and  range from 7-12.5 cm in  circumference.  Orientation: Serosa inked blue, radial fat margin inked black.  Number of mass(es): 1  Location of mass(es): Ascending colon  Size(s) of mass(es): 4.8 x 4.5 x 1.2 cm  Description of mass(es): Pink red raised with rolled edges firm nodular  mass.  Bowel circumference at site of mass(es): 7.5 cm  Gross depth of invasion: Lesion appears to grossly penetrate through the  wall into the pericolonic fat and is grossly visualized on the serosa.  Macroscopic perforation: Yes  Margins:    Proximal: 26.8 cm  Distal: 5 cm  Circumferential (Radial): 6.5 cm  Mesenteric: Less than 0.1 cm   Luminal obstruction: At least 75% obstruction  Other remarkable findings: Ileocecal valve is somewhat lipomatous. There  is a small raised pink-red polyp located 4.5 cm away from the ileocecal  valve pouch, appears to be confined to the mucosa.  Lymph nodes: Dissection of the pericolonic fat reveals 18 pink red to  dark brown lymph node candidates ranging from 0.3 cm in diameter up to  1.6 x 0.9 x 0.8 cm, some of which are grossly suspicious for tumor  involvement.  Block summary:  1-terminal ileal margin  2-distal colonic margin  3-radial fat margin  4-7-sections of mass including inked serosa and underlying pericolonic  fat  8-ileocecal valve  9-sections of appendix  10-polyp in cecal pouch  11-12- 5 lymph node candidates per cassette  13- 4 lymph nodes candidates  14-17 1 serially sectioned lymph node per cassette    Final Diagnosis performed by Delorse Lek, MD. Electronically signed  02/12/2015 2:17:44PM   The electronic signature indicates that the named Attending Pathologist  has evaluated the specimen   Technical component performed at [facility], [address], [place],  [place] [address]  Lab: [phone] Dir: Darrick Penna. Evette Doffing, MD   Professional component performed at [facility], [facility],  [place], [place], [place]  [address]  Lab: [phone] Dir: Dellia Nims. Reuel Derby, MD    RADIOGRAPHIC STUDIES: I have personally reviewed the radiological images as listed and agreed with the findings in the report.  CLINICAL DATA: Subsequent encounter for colon mass found on colonoscopy last week.  EXAM: CT ABDOMEN AND PELVIS WITH CONTRAST  TECHNIQUE: Multidetector CT imaging of the abdomen and pelvis was performed using the standard protocol following bolus administration of intravenous contrast.  CONTRAST: 150m OMNIPAQUE IOHEXOL 300 MG/ML SOLN  COMPARISON: 09/28/2009  FINDINGS: Lower chest: Unremarkable.  Hepatobiliary: 8 mm hypo attenuating lesion in the anterior left liver is stable. There is no evidence for gallstones, gallbladder wall thickening, or pericholecystic fluid. No intrahepatic or extrahepatic biliary dilation.  Pancreas: No focal mass lesion. No dilatation of the main duct. No  intraparenchymal cyst. No peripancreatic edema.  Spleen: No splenomegaly. No focal mass lesion.  Adrenals/Urinary Tract: No adrenal nodule or mass. Kidneys are unremarkable. No evidence for hydroureter. The urinary bladder appears normal for the degree of distention.  Stomach/Bowel: Stomach is nondistended. No gastric wall thickening. No evidence of outlet obstruction. Duodenum is normally positioned as is the ligament of Treitz. No small bowel wall thickening. No small bowel dilatation. The terminal ileum is normal. The appendix is normal. Apple-core lesion identified in the mid transverse colon without obstruction.  Vascular/Lymphatic: There is abdominal aortic atherosclerosis without aneurysm. No gastrohepatic or hepatoduodenal ligament lymphadenopathy. No retroperitoneal lymphadenopathy. No definite lymphadenopathy adjacent to the transverse colon lesion. No evidence for lymphadenopathy in the gastrohepatic ligament or omentum.  Reproductive: Prostate gland is mildly enlarged with  central dystrophic calcification  Other: No intraperitoneal free fluid.  Musculoskeletal: Bone windows reveal no worrisome lytic or sclerotic osseous lesions.  IMPRESSION: 1. Apple-core lesion identified in the mid transverse colon without obstruction. No evidence for lymphadenopathy in the gastrohepatic ligament or omentum. 2. Stable 8 mm hypo attenuating lesion in the left liver, likely a cyst.   Electronically Signed  By: Misty Stanley M.D.  On: 01/18/2015 11:23  ASSESSMENT & PLAN:  [id number] adenocarcinoma of the terminal ileum, Stage III Peri-neural and LVI noted, 4/19 nodes involved Colonoscopy on 01/12/2015 with Dr Erik [name] showing mass at the splenic flexure EGD on 01/12/2015 with moderate erosive gastritis from ASA use CEA 01/12/2015 at 6.6 ng/ml Extended R hemicolectomy with Dr. Adonis [name] on 02/10/2015   During the appointment, we discussed his pathology report, staging, what that means, and what's recommended to reduce his chances of recurrence. We also discused the odds of recurrence with surgery alone, vs the odds of recurrence with additional treatment. I provided him with materials on the national guidelines for colon cancer, and walked him through this information during his consult.  I reviewed his CT imaging, we will follow the area on the liver moving forward but it appears to be a cyst. He needs a CT scan of his chest. It was ordered in the past, but never done. We will reorder this today.  For labs,. I will order a CBC, CMP, CEA, ferritin and iron studies, as well as B12 and folate. We will also do a repeat CEA since it was mildly elevated prior to his surgery.  I will talk to his surgeon about a port  He sees his surgeon again on the 9th.  We will print out his apponitment information, and refer Hildred Alamin in for teaching with Mr. Diamant, so he knows what to expect as we proceed with his treatment planning.  Orders Placed This Encounter  Procedures  . CT Chest  W Contrast    JH/AMY      SELF PAY/MEDICAID POTENTIAL     LABS DRAWN 01/31    Standing Status: Future     Number of Occurrences:      Standing Expiration Date: 03/08/2016    Order Specific Question:  If indicated for the ordered procedure, I authorize the administration of contrast media per Radiology protocol    Answer:  Yes    Order Specific Question:  Reason for Exam (SYMPTOM  OR DIAGNOSIS REQUIRED)    Answer:  staging CRC    Order Specific Question:  Preferred imaging location?    Answer:  [facility]  . CBC with Differential    Standing Status: Future     Number of Occurrences: 1  Standing Expiration Date: 03/08/2016  . Comprehensive metabolic panel    Standing Status: Future     Number of Occurrences: 1     Standing Expiration Date: 03/08/2016  . Ferritin    Standing Status: Future     Number of Occurrences: 1     Standing Expiration Date: 03/08/2016  . Vitamin B12    Standing Status: Future     Number of Occurrences: 1     Standing Expiration Date: 03/08/2016  . Folate    Standing Status: Future     Number of Occurrences: 1     Standing Expiration Date: 03/08/2016  . Iron and TIBC    Standing Status: Future     Number of Occurrences: 1     Standing Expiration Date: 03/08/2016  . CEA    Standing Status: Future     Number of Occurrences: 1     Standing Expiration Date: 03/08/2016   All questions were answered. The patient knows to call the clinic with any problems, questions or concerns.  This document serves as a record of services personally performed by Ancil Linsey, MD. It was created on her behalf by Toni Amend, a trained medical scribe. The creation of this record is based on the scribe's personal observations and the provider's statements to them. This document has been checked and approved by the attending provider.  I have reviewed the above documentation for accuracy and completeness and I agree with the above.  This note was electronically  signed.  Molli Hazard, MD  03/09/2015 4:40 PM

## 2015-03-10 LAB — CEA: CEA: 2.6 ng/mL (ref 0.0–4.7)

## 2015-03-12 ENCOUNTER — Encounter: Payer: Self-pay | Admitting: Dietician

## 2015-03-12 NOTE — Progress Notes (Signed)
RD consulted by nursing staff for assessment of new CRC patient  Contacted Pt by Phone  Wt Readings from Last 10 Encounters:  03/09/15 218 lb (98.884 kg)  03/03/15 223 lb (101.152 kg)  02/10/15 230 lb (104.327 kg)  02/04/15 232 lb (105.235 kg)  01/21/15 232 lb 6.4 oz (105.416 kg)  01/06/15 223 lb (101.152 kg)  12/16/14 226 lb (102.513 kg)  12/15/14 224 lb 6.4 oz (101.787 kg)  09/16/14 221 lb 4.8 oz (100.381 kg)  08/14/14 219 lb (99.338 kg)   Patient weight is stable x 6 months. He underwent a R Hemicolectomy on 1/4. Pt reports today on phone that his weight at this time is 220 lbs. While he does admit to some weight loss, he reports it is nothing too significant and much of it is likely related to his surgery.  Pt is "doing alright". He says he feels hungry but when he eats he gets full quickly. To deal with this he says he eats "off and on" throughout the day. Told pt that is exactly what he should be doing.   Symptom wise, He reports nausea all the time, but it sounds to be mild and doesn't really affect his appetite. He does have significant diarrhea. Fortunately, he is aware that he has to keep drinking fluids throughout the day to stay hydrated. RD went on to talk about how to manage diarrhea with fiber. Unfortunately, Pt reports he was instructed to follow a low fiber diet after his surgery. Pt states he was not given or reccommended to take any antidiarrheal medication.  RD went over the need for increased protein, not only for wound healing, but also in preporation for chemotherapy in a few weeks. Went over some high sources of protein.   He is already drinking oral nutrition supplements ;He drinks Boost 2x each day.   At this time patient seems to be managing his symptoms very well. Will need to f/u on ways to deal with diarrhea once surgeon allows fiber back into diet.   Mailed my contact info, coupons, and handouts titled "Increasing Calories and Protein" and    Burtis Junes RD, LDN Nutrition Pager: [phone] 03/12/2015 11:07 AM

## 2015-03-15 ENCOUNTER — Ambulatory Visit (HOSPITAL_COMMUNITY)
Admission: RE | Admit: 2015-03-15 | Discharge: 2015-03-15 | Disposition: A | Discharge status: Home / Self Care | Payer: Medicaid Other | Source: Ambulatory Visit | Attending: Hematology & Oncology | Admitting: Hematology & Oncology

## 2015-03-15 ENCOUNTER — Other Ambulatory Visit (HOSPITAL_COMMUNITY): Payer: Self-pay

## 2015-03-15 DIAGNOSIS — Z9049 Acquired absence of other specified parts of digestive tract: Secondary | ICD-10-CM | POA: Insufficient documentation

## 2015-03-15 DIAGNOSIS — J432 Centrilobular emphysema: Secondary | ICD-10-CM | POA: Diagnosis not present

## 2015-03-15 DIAGNOSIS — I2510 Atherosclerotic heart disease of native coronary artery without angina pectoris: Secondary | ICD-10-CM | POA: Insufficient documentation

## 2015-03-15 DIAGNOSIS — R911 Solitary pulmonary nodule: Secondary | ICD-10-CM | POA: Insufficient documentation

## 2015-03-15 DIAGNOSIS — R918 Other nonspecific abnormal finding of lung field: Secondary | ICD-10-CM | POA: Diagnosis not present

## 2015-03-15 DIAGNOSIS — C184 Malignant neoplasm of transverse colon: Secondary | ICD-10-CM | POA: Diagnosis not present

## 2015-03-15 MED ORDER — IOHEXOL 300 MG/ML  SOLN
75.0000 mL | Freq: Once | INTRAMUSCULAR | Status: AC | PRN
  Administered 2015-03-15: 75 mL via INTRAVENOUS

## 2015-03-16 ENCOUNTER — Other Ambulatory Visit (HOSPITAL_COMMUNITY): Payer: Self-pay | Admitting: Hematology & Oncology

## 2015-03-18 ENCOUNTER — Ambulatory Visit (INDEPENDENT_AMBULATORY_CARE_PROVIDER_SITE_OTHER): Payer: Self-pay | Admitting: Surgery

## 2015-03-18 ENCOUNTER — Encounter: Payer: Self-pay | Admitting: Surgery

## 2015-03-18 ENCOUNTER — Ambulatory Visit: Payer: Self-pay | Admitting: Neurology

## 2015-03-18 VITALS — BP 132/84 | HR 94 | Temp 98.4°F | Wt 221.0 lb

## 2015-03-18 DIAGNOSIS — C184 Malignant neoplasm of transverse colon: Secondary | ICD-10-CM

## 2015-03-18 NOTE — Patient Instructions (Addendum)
Try using Imodium twice a day for your diarrhea. You will have your port placed on 03/24/2015 and then we will see you back at our clinic on 04/02/2015 with Dr. Burt Knack.

## 2015-03-18 NOTE — Progress Notes (Signed)
Subjective:     Patient ID: Erik [name], male   DOB: Jan 02, 1956, 60 y.o.   MRN: [id number]  HPI 60 year old with metastatic colon cancer status post sigmoidectomy with Dr. Adonis Huguenin previously. Patient has been healing well he does state that he gets early satiety, has been taking in protein shakes and able to eat and keep things down. Patient does state that he does have diarrhea about 4 times a day states it is not pure liquid but it is looser.. Patient has been continuing to change the dressing to his abdominal wound but states that there is minimal drainage to the area.  Past Medical History  Diagnosis Date  . Head trauma 2001    closed head injury; coma for 4 weeks  . Depression   . Abnormal stress echocardiogram   . Hypercholesterolemia   . Hypertension   . Alcohol abuse     Heavy Use up until 2010  . GERD (gastroesophageal reflux disease)   . PTSD (post-traumatic stress disorder)   . Anxiety   . Adenocarcinoma of transverse colon ([place]) 02/10/2015   Past Surgical History  Procedure Laterality Date  . Kidney surgery      >30 years ago  . Cardiac cath    . Craniotomy  2001  . Head injury surgery    . Colonoscopy  2011    Dr. Oneida Alar: multiple adenomas and hyperplastic polyps  . Colonoscopy with propofol N/A 01/12/2015    Procedure: COLONOSCOPY WITH PROPOFOL;  Surgeon: Danie Binder, MD;  Location: [facility];  Service: Endoscopy;  Laterality: N/A;  1030  . Esophagogastroduodenoscopy (egd) with propofol N/A 01/12/2015    Procedure: ESOPHAGOGASTRODUODENOSCOPY (EGD) WITH PROPOFOL;  Surgeon: Danie Binder, MD;  Location: [facility];  Service: Endoscopy;  Laterality: N/A;  . Hernia repair Right 2012    Inguinal- Forestine Na  . Laparoscopic right hemi colectomy Left 02/10/2015    Procedure: LAPAROSCOPIC THEN OPEN RIGHT HEMI COLECTOMY;  Surgeon: [name], MD;  Location: [facility];  Service: General;  Laterality: Left;   Family History  Problem Relation Age of Onset  .  Ataxia Neg Hx   . Chorea Neg Hx   . Dementia Neg Hx   . Mental retardation Neg Hx   . Migraines Neg Hx   . Multiple sclerosis Neg Hx   . Neurofibromatosis Neg Hx   . Neuropathy Neg Hx   . Parkinsonism Neg Hx   . Seizures Neg Hx   . Stroke Neg Hx   . Colon cancer Neg Hx   . Pulmonary embolism Mother   . Breast cancer Mother   . Heart disease Father   . Cancer Father 12    Leukemia  . Cancer Mother   . Cancer Paternal Grandfather     Lung   Social History   Social History  . Marital Status: Divorced    Spouse Name: N/A  . Number of Children: N/A  . Years of Education: N/A   Occupational History  . Full time    Social History Main Topics  . Smoking status: Current Every Day Smoker -- 0.25 packs/day    Types: Cigarettes  . Smokeless tobacco: Never Used     Comment: patient is aware he needs to quit  . Alcohol Use: 0.0 oz/week    0 Standard drinks or equivalent per week     Comment: beer occ, history of ETOH abuse in remote past.   . Drug Use: Yes    Special: Marijuana  Comment: marijuana last 2012  . Sexual Activity:    Partners: Female   Other Topics Concern  . None   Social History Narrative   Single    Current outpatient prescriptions:  .  albuterol (PROVENTIL HFA;VENTOLIN HFA) 108 (90 BASE) MCG/ACT inhaler, Inhale 2 puffs into the lungs every 6 (six) hours as needed for wheezing or shortness of breath., Disp: 1 Inhaler, Rfl: 1 .  amitriptyline (ELAVIL) 25 MG tablet, TAKE ONE TABLET BY MOUTH AT BEDTIME. (Patient taking differently: TAKE ONE TABLET BY MOUTH AT BEDTIME. prn), Disp: 30 tablet, Rfl: 2 .  dexlansoprazole (DEXILANT) 60 MG capsule, Take 1 capsule (60 mg total) by mouth daily., Disp: 90 capsule, Rfl: 1 .  FLUoxetine (PROZAC) 20 MG capsule, Take 20 mg by mouth daily., Disp: , Rfl:  .  rosuvastatin (CRESTOR) 40 MG tablet, Take 1 tablet (40 mg total) by mouth daily., Disp: 90 tablet, Rfl: 1 .  sildenafil (VIAGRA) 100 MG tablet, Take 0.5-1 tablets  (50-100 mg total) by mouth daily as needed for erectile dysfunction., Disp: 10 tablet, Rfl: 1 No Known Allergies     Review of Systems  Constitutional: Negative for fever, chills, activity change, appetite change and fatigue.  HENT: Negative for congestion and sore throat.   Cardiovascular: Negative for chest pain, palpitations and leg swelling.  Gastrointestinal: Positive for abdominal pain, diarrhea and abdominal distention. Negative for nausea, vomiting, constipation and blood in stool.  Genitourinary: Negative for dysuria and hematuria.  Musculoskeletal: Negative for arthralgias and neck pain.  Skin: Negative for color change, pallor, rash and wound.  Neurological: Negative for dizziness and weakness.  Hematological: Negative for adenopathy. Does not bruise/bleed easily.  Psychiatric/Behavioral: Negative for agitation. The patient is not nervous/anxious.   All other systems reviewed and are negative.      Filed Vitals:   03/18/15 1424  BP: 132/84  Pulse: 94  Temp: 98.4 F (36.9 C)    Objective:   Physical Exam  Constitutional: He is oriented to person, place, and time. He appears well-developed and well-nourished. No distress.  HENT:  Head: Normocephalic and atraumatic.  Right Ear: External ear normal.  Left Ear: External ear normal.  Nose: Nose normal.  Mouth/Throat: Oropharynx is clear and moist. No oropharyngeal exudate.  Eyes: Conjunctivae are normal. Pupils are equal, round, and reactive to light. No scleral icterus.  Neck: Normal range of motion. Neck supple. No tracheal deviation present.  Cardiovascular: Normal rate, regular rhythm, normal heart sounds and intact distal pulses.   Pulmonary/Chest: Effort normal and breath sounds normal. No respiratory distress. He has no wheezes.  Abdominal: Soft. Bowel sounds are normal. He exhibits no distension. There is tenderness.  Midline wound healing well, inferior portion with small area of granulation tissue, silver  nitrate applied to the area  Musculoskeletal: Normal range of motion. He exhibits no edema or tenderness.  Neurological: He is alert and oriented to person, place, and time. No cranial nerve deficit.  Skin: Skin is warm and dry. No rash noted. No erythema.  Psychiatric: He has a normal mood and affect. His behavior is normal. Judgment and thought content normal.  Vitals reviewed.      Assessment:     60 yr old metastatic colon cancer.    Plan:     Discussed with the patient that his wound is healing well he can change over to dry dressings.  Also called and discussed with patient's oncologist Dr. Whitney Muse at [facility]. They would like to start chemotherapy on  him requesting [place] to place a port. I discussed the risk benefits alternatives and expected outcomes with the patient including bleeding infection, need for further procedures need for removal of a port and pneumothorax as well as potential need for chest tube placement if this were to occur.  Patient was given opportunity to ask questions and have them answered. Patient is agreeable to have port placement on February 15 with my partner Dr. Perrin Maltese.

## 2015-03-19 ENCOUNTER — Telehealth: Payer: Self-pay | Admitting: Surgery

## 2015-03-19 NOTE — Telephone Encounter (Signed)
Pt advised of pre op date/time and sx date. Sx: 03/24/15 with Dr Jeremy Johann placement Pre op: 03/23/15 between 9-1:00--Phone.

## 2015-03-22 ENCOUNTER — Other Ambulatory Visit (HOSPITAL_COMMUNITY): Payer: Self-pay | Admitting: Hematology & Oncology

## 2015-03-22 ENCOUNTER — Ambulatory Visit: Payer: Self-pay | Admitting: Physician Assistant

## 2015-03-22 ENCOUNTER — Encounter (HOSPITAL_COMMUNITY): Payer: Medicaid Other | Attending: Hematology & Oncology

## 2015-03-22 ENCOUNTER — Encounter: Payer: Self-pay | Admitting: Physician Assistant

## 2015-03-22 VITALS — BP 132/78 | HR 112 | Temp 97.3°F | Ht 74.0 in | Wt 219.6 lb

## 2015-03-22 DIAGNOSIS — Z79899 Other long term (current) drug therapy: Secondary | ICD-10-CM | POA: Insufficient documentation

## 2015-03-22 DIAGNOSIS — C184 Malignant neoplasm of transverse colon: Secondary | ICD-10-CM

## 2015-03-22 DIAGNOSIS — Z86010 Personal history of colonic polyps: Secondary | ICD-10-CM | POA: Insufficient documentation

## 2015-03-22 DIAGNOSIS — Z9889 Other specified postprocedural states: Secondary | ICD-10-CM | POA: Insufficient documentation

## 2015-03-22 DIAGNOSIS — I2510 Atherosclerotic heart disease of native coronary artery without angina pectoris: Secondary | ICD-10-CM | POA: Insufficient documentation

## 2015-03-22 DIAGNOSIS — E785 Hyperlipidemia, unspecified: Secondary | ICD-10-CM

## 2015-03-22 DIAGNOSIS — F4310 Post-traumatic stress disorder, unspecified: Secondary | ICD-10-CM | POA: Insufficient documentation

## 2015-03-22 DIAGNOSIS — F329 Major depressive disorder, single episode, unspecified: Secondary | ICD-10-CM | POA: Insufficient documentation

## 2015-03-22 DIAGNOSIS — E7800 Pure hypercholesterolemia, unspecified: Secondary | ICD-10-CM | POA: Insufficient documentation

## 2015-03-22 DIAGNOSIS — I10 Essential (primary) hypertension: Secondary | ICD-10-CM | POA: Insufficient documentation

## 2015-03-22 DIAGNOSIS — Z85038 Personal history of other malignant neoplasm of large intestine: Secondary | ICD-10-CM | POA: Insufficient documentation

## 2015-03-22 DIAGNOSIS — K219 Gastro-esophageal reflux disease without esophagitis: Secondary | ICD-10-CM | POA: Insufficient documentation

## 2015-03-22 DIAGNOSIS — R600 Localized edema: Secondary | ICD-10-CM | POA: Insufficient documentation

## 2015-03-22 DIAGNOSIS — Z5111 Encounter for antineoplastic chemotherapy: Secondary | ICD-10-CM | POA: Insufficient documentation

## 2015-03-22 MED ORDER — PROCHLORPERAZINE MALEATE 10 MG PO TABS: 10.0000 mg | ORAL_TABLET | Freq: Four times a day (QID) | ORAL | Status: DC | PRN

## 2015-03-22 MED ORDER — ONDANSETRON HCL 8 MG PO TABS: 8.0000 mg | ORAL_TABLET | Freq: Three times a day (TID) | ORAL | Status: DC | PRN

## 2015-03-22 MED ORDER — LIDOCAINE-PRILOCAINE 2.5-2.5 % EX CREA: TOPICAL_CREAM | CUTANEOUS | Status: DC

## 2015-03-22 MED ORDER — ATORVASTATIN CALCIUM 20 MG PO TABS: 20.0000 mg | ORAL_TABLET | Freq: Every day | ORAL | Status: DC

## 2015-03-22 MED FILL — ONDANSETRON HCL 8 MG TABLET: 8 | 10 days supply | Qty: 30 | Fill #0

## 2015-03-22 MED FILL — PROCHLORPERAZINE 10 MG TAB: 10 | 7 days supply | Qty: 30 | Fill #0

## 2015-03-22 MED FILL — LIDOCAINE-PRILOCAINE CREAM: 2.5-2.5 | 30 days supply | Qty: 30 | Fill #0

## 2015-03-22 NOTE — Progress Notes (Signed)
BP 132/78 mmHg  Pulse 112  Temp(Src) 97.3 F (36.3 C)  Ht 6\' 2"  (1.88 m)  Wt 219 lb 9.6 oz (99.61 kg)  BMI 28.18 kg/m2  SpO2 98%   Subjective:    Patient ID: Erik [name], male    DOB: 27-Apr-1955, 60 y.o.   MRN: [id number]  HPI: Erik [name] is a 60 y.o. male presenting on 03/22/2015 for Hyperlipidemia   HPI   Pt currently getting treatment for colon cancer and will be starting chemotherapy soon.    Pt has applied for medicaid but he hasn't heard yet if he was approved.   Pt states some depression.  He is still going to [facility].  no cp or sob, just feels weak  No smoking since his surgery  Says he is going to follow-up with neurology in early march and to cardiology in june  Relevant past medical, surgical, family and social history reviewed and updated as indicated. Interim medical history since our last visit reviewed. Allergies and medications reviewed and updated.  CURRENT MEDS: albuterol MDI Amitriptyline dexliant  prozac crestor viagra     Review of Systems  Constitutional: Positive for appetite change and fatigue. Negative for fever, chills, diaphoresis and unexpected weight change.  HENT: Positive for congestion and sore throat. Negative for dental problem, drooling, ear pain, facial swelling, hearing loss, mouth sores, sneezing, trouble swallowing and voice change.   Eyes: Negative for pain, discharge, redness, itching and visual disturbance.  Respiratory: Positive for shortness of breath. Negative for cough, choking and wheezing.   Cardiovascular: Negative for chest pain, palpitations and leg swelling.  Gastrointestinal: Positive for diarrhea. Negative for vomiting, abdominal pain, constipation and blood in stool.  Endocrine: Negative for cold intolerance, heat intolerance and polydipsia.  Genitourinary: Negative for dysuria, hematuria and decreased urine volume.  Musculoskeletal: Positive for gait problem. Negative for back pain and  arthralgias.  Skin: Negative for rash.  Allergic/Immunologic: Negative for environmental allergies.  Neurological: Negative for seizures, syncope, light-headedness and headaches.  Hematological: Negative for adenopathy.  Psychiatric/Behavioral: Positive for dysphoric mood. Negative for suicidal ideas and agitation. The patient is nervous/anxious.     Per HPI unless specifically indicated above     Objective:    BP 132/78 mmHg  Pulse 112  Temp(Src) 97.3 F (36.3 C)  Ht 6\' 2"  (1.88 m)  Wt 219 lb 9.6 oz (99.61 kg)  BMI 28.18 kg/m2  SpO2 98%  Wt Readings from Last 3 Encounters:  03/22/15 219 lb 9.6 oz (99.61 kg)  03/18/15 221 lb (100.245 kg)  03/09/15 218 lb (98.884 kg)    Physical Exam  Constitutional: He is oriented to person, place, and time. He appears well-developed and well-nourished.  HENT:  Head: Normocephalic and atraumatic.  Neck: Neck supple.  Cardiovascular: Normal rate and regular rhythm.   Pulmonary/Chest: Effort normal and breath sounds normal. He has no wheezes.  Abdominal: Soft. Bowel sounds are normal. There is tenderness. There is no rigidity, no rebound and no guarding.  Surgical dressings in place limiting abd exam  Musculoskeletal: He exhibits no edema.  Lymphadenopathy:    He has no cervical adenopathy.  Neurological: He is alert and oriented to person, place, and time.  Skin: Skin is warm and dry.  Psychiatric: He has a normal mood and affect. His behavior is normal.  Vitals reviewed.   Results for orders placed or performed in visit on 03/09/15  CBC with Differential  Result Value Ref Range   WBC 8.3 4.0 -  10.5 K/uL   RBC 4.55 4.22 - 5.81 MIL/uL   Hemoglobin 14.4 13.0 - 17.0 g/dL   HCT 43.0 39.0 - 52.0 %   MCV 94.5 78.0 - 100.0 fL   MCH 31.6 26.0 - 34.0 pg   MCHC 33.5 30.0 - 36.0 g/dL   RDW 13.9 11.5 - 15.5 %   Platelets 307 150 - 400 K/uL   Neutrophils Relative % 64 %   Neutro Abs 5.4 1.7 - 7.7 K/uL   Lymphocytes Relative 24 %   Lymphs  Abs 2.0 0.7 - 4.0 K/uL   Monocytes Relative 9 %   Monocytes Absolute 0.7 0.1 - 1.0 K/uL   Eosinophils Relative 2 %   Eosinophils Absolute 0.2 0.0 - 0.7 K/uL   Basophils Relative 1 %   Basophils Absolute 0.1 0.0 - 0.1 K/uL  Comprehensive metabolic panel  Result Value Ref Range   Sodium 136 135 - 145 mmol/L   Potassium 3.7 3.5 - 5.1 mmol/L   Chloride 98 (L) 101 - 111 mmol/L   CO2 27 22 - 32 mmol/L   Glucose, Bld 106 (H) 65 - 99 mg/dL   BUN 8 6 - 20 mg/dL   Creatinine, Ser 0.92 0.61 - 1.24 mg/dL   Calcium 9.1 8.9 - 10.3 mg/dL   Total Protein 7.5 6.5 - 8.1 g/dL   Albumin 3.7 3.5 - 5.0 g/dL   AST 17 15 - 41 U/L   ALT 9 (L) 17 - 63 U/L   Alkaline Phosphatase 66 38 - 126 U/L   Total Bilirubin 0.6 0.3 - 1.2 mg/dL   GFR calc non Af Amer >60 >60 mL/min   GFR calc Af Amer >60 >60 mL/min   Anion gap 11 5 - 15  Ferritin  Result Value Ref Range   Ferritin 52 24 - 336 ng/mL  Vitamin B12  Result Value Ref Range   Vitamin B-12 149 (L) 180 - 914 pg/mL  Folate  Result Value Ref Range   Folate 8.8 >5.9 ng/mL  Iron and TIBC  Result Value Ref Range   Iron 45 45 - 182 ug/dL   TIBC 361 250 - 450 ug/dL   Saturation Ratios 12 (L) 17.9 - 39.5 %   UIBC 316 ug/dL  CEA  Result Value Ref Range   CEA 2.6 0.0 - 4.7 ng/mL      Assessment & Plan:   Encounter Diagnoses  Name Primary?  . Hyperlipidemia Yes  . Adenocarcinoma of transverse colon ([place])   . Atherosclerosis of native coronary artery of native heart without angina pectoris     -recommended that pt keep calendar to keep up with his multiple appointments -congratulated pt on stopping smoking. Encouraged him to continue with this as his treatments will go better with him off the cigarettes -Change to lipitor (crestor no longer available through [employer])- will not check lipid levels today.  cmp done recently is good. -f/u OV 3 months.  RTO sooner prn

## 2015-03-22 NOTE — Patient Instructions (Addendum)
[facility] Instructions   Premeds: Zofran - for nausea/vomiting prevention/reduction. Dexamethasone - steroid - given to reduce the risk of you having an allergic type reaction to the chemotherapy. Dex can cause you to feel energized, nervous/anxious/jittery, make you have trouble sleeping, and/or make you feel hot/flushed in the face/neck and/or look pink/red in the face/neck. These side effects will pass as the Dex wears off. (takes 30 minutes to infuse)  Oxaliplatin - anaphylactic reaction, neurotoxicity (i.e., headache, fatigue, difficulty sleeping, pain). Peripheral neuropathy (numbness/tingling/burning in hands/fingers/feet/toes) - will be aggravated by cold/cool temperatures. We need to know when you develop peripheral neuropathy so that we can monitor it and treat if necessary. Nausea/vomiting, diarrhea, bone marrow suppression (lowers white blood cells (fight infection), lowers red blood cells (make up y  our blood), lowers platelets (help blood to clot). Pulmonary fibrosis. Once you have received Oxaliplatin do NOT eat or drinking anything cold/cool for 5-10 days! Do NOT breathe in cold/cool air and do NOT touch anything cold for 5-10 days. The time frame varies from patient to patient on the length of time you must abstain from the above mentioned. Best advice is to wait at least 5 days before attempting to reintroduce cold/cool back into life. Slowly reintroduce cool/cold things! Wear gloves when getting items out of the refrigerator (of course, these would be things you are going to heat to eat)! (takes 2 hours to infuse)  Leucovorin - this is a medication that is not chemo but given with chemo. This med "rescues" the healthy cells before we administer the drug 5FU. This makes the 5FU work better. (takes 2 hours to infuse- this goes in @ the same time as the Oxaliplatin or Irinotecan chemo)  5FU: bone marrow suppression (low white blood cells - wbcs fight  infection, low red blood cells - rbcs make up your blood, low platelets - this is what makes your blood clot, nausea/vomiting, diarrhea, mouth sores, hair loss, dry skin, ocular toxicities (increased tear production, sensitivity to light). You must wear sunscreen/sunglasses. Cover your skin when out in sunlight. You will get burned very easily. The nurse will sit @ your bedside and push this medication in via a syringe. This will take 10 minutes. Then she will connect you to an ambulatory pump with this medication attached. You will wear the pump for 46 hours. The 5FU chemo will infuse for 46 hours total. Then the pump will be removed).    POTENTIAL SIDE EFFECTS OF TREATMENT: Increased Susceptibility to Infection, Vomiting, Constipation, Hair Thinning, Changes in Character of Skin and Nails (brittleness, dryness,etc.), Bone Marrow Suppression, Abdominal Cramping, Nausea, Diarrhea, Sun Sensitivity and Mouth Sores   EDUCATIONAL MATERIALS GIVEN AND REVIEWED: Chemotherapy and You booklet Specific Instructions Sheets: Oxaliplatin, 5FU, Leucovorin, Zofran, Dexamethasone, EMLA cream, Zofran tablets, Compazine tablets   SELF CARE ACTIVITIES WHILE ON CHEMOTHERAPY: Increase your fluid intake 48 hours prior to treatment and drink at least 2 quarts per day after treatment., No alcohol intake., No aspirin or other medications unless approved by your oncologist., Eat foods that are light and easy to digest., Eat foods at cold or room temperature., No fried, fatty, or spicy foods immediately before or after treatment., Have teeth cleaned professionally before starting treatment. Keep dentures and partial plates clean., Use soft toothbrush and do not use mouthwashes that contain alcohol. Biotene is a good mouthwash that is available at most pharmacies or may be ordered by calling [phone]., Use warm salt water gargles (1 teaspoon salt per  1 quart warm water) before and after meals and at bedtime. Or you may rinse  with 2 tablespoons of three -percent hydrogen peroxide mixed in eight ounces of water., Always use sunscreen with SPF (Sun Protection Factor) of 30 or higher., Use your nausea medication as directed to prevent nausea., Use your stool softener or laxative as directed to prevent constipation. and Use your anti-diarrheal medication as directed to stop diarrhea.  Please wash your hands for at least 30 seconds using warm soapy water. Handwashing is the #1 way to prevent the spread of germs. Stay away from sick people or people who are getting over a cold. If you develop respiratory systems such as green/yellow mucus production or productive cough or persistent cough let us know and we will see if you need an antibiotic. It is a good idea to keep a pair of gloves on when going into grocery stores/[employer] to decrease your risk of coming into contact with germs on the carts, etc. Carry alcohol hand gel with you at all times and use it frequently if out in public. All foods need to be cooked thoroughly. No raw foods. No medium or undercooked meats, eggs. If your food is cooked medium well, it does not need to be hot pink or saturated with bloody liquid at all. Vegetables and fruits need to be washed/rinsed under the faucet with a dish detergent before being consumed. You can eat raw fruits and vegetables unless we tell you otherwise but it would be best if you cooked them or bought frozen. Do not eat off of salad bars or hot bars unless you really trust the cleanliness of the restaurant. If you need dental work, please let Dr. Whitney Muse know before you go for your appointment so that we can coordinate the best possible time for you in regards to your chemo regimen. You need to also let your dentist know that you are actively taking chemo. We may need to do labs prior to your dental appointment. We also want your bowels moving at least every other day. If this is not happening, we need to know so that we can get you on a  bowel regimen to help you go. If you are going to have sex, your partner must wear a condom. This is to protect them from potential chemotherapy exposure. This will need to continue for 28 days post completion of your final chemotherapy.   MEDICATIONS: You have been given prescriptions for the following medications:  Zofran/Ondansetron 8mg  tablet. Take 1 tablet every 8 hours as needed for nausea/vomiting. (#1 for nausea but can cause constipation)  Compazine/Prochlorperazine 10mg  tablet. Take 1 tablet every 6 hours as needed for nausea/vomiting. (#2 for nausea but can cause drowsiness)  EMLA cream. Appply a quarter size amount to port site 1 hour prior to chemo. Do not rub in. Cover with plastic wrap.   SYMPTOMS TO REPORT AS SOON AS POSSIBLE AFTER TREATMENT:  FEVER GREATER THAN 100.5 F  CHILLS WITH OR WITHOUT FEVER  NAUSEA AND VOMITING THAT IS NOT CONTROLLED WITH YOUR NAUSEA MEDICATION  UNUSUAL SHORTNESS OF BREATH  UNUSUAL BRUISING OR BLEEDING  TENDERNESS IN MOUTH AND THROAT WITH OR WITHOUT PRESENCE OF ULCERS  URINARY PROBLEMS  BOWEL PROBLEMS  UNUSUAL RASH   Wear comfortable clothing and clothing appropriate for easy access to any Portacath or PICC line. Let us know if there is anything that we can do to make your therapy better!      I have been informed and  understand all of the instructions given to me and have received a copy. I have been instructed to call the clinic [phone] or my family physician as soon as possible for continued medical care, if indicated. I do not have any more questions at this time but understand that I may call the [facility] or the Patient Navigator at [phone] during office hours should I have questions or need assistance in obtaining follow-up care.     Ondansetron injection What is this medicine? ONDANSETRON (on DAN se tron) is used to treat nausea and vomiting caused by chemotherapy. It is also used to prevent or treat  nausea and vomiting after surgery. This medicine may be used for other purposes; ask your health care provider or pharmacist if you have questions. What should I tell my health care provider before I take this medicine? They need to know if you have any of these conditions: -heart disease -history of irregular heartbeat -liver disease -low levels of magnesium or potassium in the blood -an unusual or allergic reaction to ondansetron, granisetron, other medicines, foods, dyes, or preservatives -pregnant or trying to get pregnant -breast-feeding How should I use this medicine? This medicine is for infusion into a vein. It is given by a health care professional in a hospital or clinic setting. Talk to your pediatrician regarding the use of this medicine in children. Special care may be needed. Overdosage: If you think you have taken too much of this medicine contact a poison control center or emergency room at once. NOTE: This medicine is only for you. Do not share this medicine with others. What if I miss a dose? This does not apply. What may interact with this medicine? Do not take this medicine with any of the following medications: -apomorphine -certain medicines for fungal infections like fluconazole, itraconazole, ketoconazole, posaconazole, voriconazole -cisapride -dofetilide -dronedarone -pimozide -thioridazine -ziprasidone This medicine may also interact with the following medications: -carbamazepine -certain medicines for depression, anxiety, or psychotic disturbances -fentanyl -linezolid -MAOIs like Carbex, Eldepryl, Marplan, Nardil, and Parnate -methylene blue (injected into a vein) -other medicines that prolong the QT interval (cause an abnormal heart rhythm) -phenytoin -rifampicin -tramadol This list may not describe all possible interactions. Give your health care provider a list of all the medicines, herbs, non-prescription drugs, or dietary supplements you use.  Also tell them if you smoke, drink alcohol, or use illegal drugs. Some items may interact with your medicine. What should I watch for while using this medicine? Your condition will be monitored carefully while you are receiving this medicine. What side effects may I notice from receiving this medicine? Side effects that you should report to your doctor or health care professional as soon as possible: -allergic reactions like skin rash, itching or hives, swelling of the face, lips, or tongue -breathing problems -confusion -dizziness -fast or irregular heartbeat -feeling faint or lightheaded, falls -fever and chills -loss of balance or coordination -seizures -sweating -swelling of the hands and feet -tightness in the chest -tremors -unusually weak or tired Side effects that usually do not require medical attention (report to your doctor or health care professional if they continue or are bothersome): -constipation or diarrhea -headache This list may not describe all possible side effects. Call your doctor for medical advice about side effects. You may report side effects to FDA at 1-800-FDA-1088. Where should I keep my medicine? This drug is given in a hospital or clinic and will not be stored at home. NOTE: This sheet is  a summary. It may not cover all possible information. If you have questions about this medicine, talk to your doctor, pharmacist, or health care provider.    2016, Elsevier/Gold Standard. (2012-10-30 16:18:28) Dexamethasone injection What is this medicine? DEXAMETHASONE (dex a METH a sone) is a corticosteroid. It is used to treat inflammation of the skin, joints, lungs, and other organs. Common conditions treated include asthma, allergies, and arthritis. It is also used for other conditions, like blood disorders and diseases of the adrenal glands. This medicine may be used for other purposes; ask your health care provider or pharmacist if you have questions. What  should I tell my health care provider before I take this medicine? They need to know if you have any of these conditions: -blood clotting problems -Cushing's syndrome -diabetes -glaucoma -heart problems or disease -high blood pressure -infection like herpes, measles, tuberculosis, or chickenpox -kidney disease -liver disease -mental problems -myasthenia gravis -osteoporosis -previous heart attack -seizures -stomach, ulcer or intestine disease including colitis and diverticulitis -thyroid problem -an unusual or allergic reaction to dexamethasone, corticosteroids, other medicines, lactose, foods, dyes, or preservatives -pregnant or trying to get pregnant -breast-feeding How should I use this medicine? This medicine is for injection into a muscle, joint, lesion, soft tissue, or vein. It is given by a health care professional in a hospital or clinic setting. Talk to your pediatrician regarding the use of this medicine in children. Special care may be needed. Overdosage: If you think you have taken too much of this medicine contact a poison control center or emergency room at once. NOTE: This medicine is only for you. Do not share this medicine with others. What if I miss a dose? This may not apply. If you are having a series of injections over a prolonged period, try not to miss an appointment. Call your doctor or health care professional to reschedule if you are unable to keep an appointment. What may interact with this medicine? Do not take this medicine with any of the following medications: -mifepristone, RU-486 -vaccines This medicine may also interact with the following medications: -amphotericin B -antibiotics like clarithromycin, erythromycin, and troleandomycin -aspirin and aspirin-like drugs -barbiturates like phenobarbital -carbamazepine -cholestyramine -cholinesterase inhibitors like donepezil, galantamine, rivastigmine, and  tacrine -cyclosporine -digoxin -diuretics -ephedrine -male hormones, like estrogens or progestins and birth control pills -indinavir -isoniazid -ketoconazole -medicines for diabetes -medicines that improve muscle tone or strength for conditions like myasthenia gravis -NSAIDs, medicines for pain and inflammation, like ibuprofen or naproxen -phenytoin -rifampin -thalidomide -warfarin This list may not describe all possible interactions. Give your health care provider a list of all the medicines, herbs, non-prescription drugs, or dietary supplements you use. Also tell them if you smoke, drink alcohol, or use illegal drugs. Some items may interact with your medicine. What should I watch for while using this medicine? Your condition will be monitored carefully while you are receiving this medicine. If you are taking this medicine for a long time, carry an identification card with your name and address, the type and dose of your medicine, and your doctor's name and address. This medicine may increase your risk of getting an infection. Stay away from people who are sick. Tell your doctor or health care professional if you are around anyone with measles or chickenpox. Talk to your health care provider before you get any vaccines that you take this medicine. If you are going to have surgery, tell your doctor or health care professional that you have taken this medicine within the  last twelve months. Ask your doctor or health care professional about your diet. You may need to lower the amount of salt you eat. The medicine can increase your blood sugar. If you are a diabetic check with your doctor if you need help adjusting the dose of your diabetic medicine. What side effects may I notice from receiving this medicine? Side effects that you should report to your doctor or health care professional as soon as possible: -allergic reactions like skin rash, itching or hives, swelling of the face, lips,  or tongue -black or tarry stools -change in the amount of urine -changes in vision -confusion, excitement, restlessness, a false sense of well-being -fever, sore throat, sneezing, cough, or other signs of infection, wounds that will not heal -hallucinations -increased thirst -mental depression, mood swings, mistaken feelings of self importance or of being mistreated -pain in hips, back, ribs, arms, shoulders, or legs -pain, redness, or irritation at the injection site -redness, blistering, peeling or loosening of the skin, including inside the mouth -rounding out of face -swelling of feet or lower legs -unusual bleeding or bruising -unusual tired or weak -wounds that do not heal Side effects that usually do not require medical attention (report to your doctor or health care professional if they continue or are bothersome): -diarrhea or constipation -change in taste -headache -nausea, vomiting -skin problems, acne, thin and shiny skin -touble sleeping -unusual growth of hair on the face or body -weight gain This list may not describe all possible side effects. Call your doctor for medical advice about side effects. You may report side effects to FDA at 1-800-FDA-1088. Where should I keep my medicine? This drug is given in a hospital or clinic and will not be stored at home. NOTE: This sheet is a summary. It may not cover all possible information. If you have questions about this medicine, talk to your doctor, pharmacist, or health care provider.    2016, [employer]/Gold Standard. (2007-05-16 14:04:12) Oxaliplatin Injection What is this medicine? OXALIPLATIN (ox AL i PLA tin) is a chemotherapy drug. It targets fast dividing cells, like cancer cells, and causes these cells to die. This medicine is used to treat cancers of the colon and rectum, and many other cancers. This medicine may be used for other purposes; ask your health care provider or pharmacist if you have questions. What  should I tell my health care provider before I take this medicine? They need to know if you have any of these conditions: -kidney disease -an unusual or allergic reaction to oxaliplatin, other chemotherapy, other medicines, foods, dyes, or preservatives -pregnant or trying to get pregnant -breast-feeding How should I use this medicine? This drug is given as an infusion into a vein. It is administered in a hospital or clinic by a specially trained health care professional. Talk to your pediatrician regarding the use of this medicine in children. Special care may be needed. Overdosage: If you think you have taken too much of this medicine contact a poison control center or emergency room at once. NOTE: This medicine is only for you. Do not share this medicine with others. What if I miss a dose? It is important not to miss a dose. Call your doctor or health care professional if you are unable to keep an appointment. What may interact with this medicine? -medicines to increase blood counts like filgrastim, pegfilgrastim, sargramostim -probenecid -some antibiotics like amikacin, gentamicin, neomycin, polymyxin B, streptomycin, tobramycin -zalcitabine Talk to your doctor or health care professional before taking  any of these medicines: -acetaminophen -aspirin -ibuprofen -ketoprofen -naproxen This list may not describe all possible interactions. Give your health care provider a list of all the medicines, herbs, non-prescription drugs, or dietary supplements you use. Also tell them if you smoke, drink alcohol, or use illegal drugs. Some items may interact with your medicine. What should I watch for while using this medicine? Your condition will be monitored carefully while you are receiving this medicine. You will need important blood work done while you are taking this medicine. This medicine can make you more sensitive to cold. Do not drink cold drinks or use ice. Cover exposed skin before  coming in contact with cold temperatures or cold objects. When out in cold weather wear warm clothing and cover your mouth and nose to warm the air that goes into your lungs. Tell your doctor if you get sensitive to the cold. This drug may make you feel generally unwell. This is not uncommon, as chemotherapy can affect healthy cells as well as cancer cells. Report any side effects. Continue your course of treatment even though you feel ill unless your doctor tells you to stop. In some cases, you may be given additional medicines to help with side effects. Follow all directions for their use. Call your doctor or health care professional for advice if you get a fever, chills or sore throat, or other symptoms of a cold or flu. Do not treat yourself. This drug decreases your body's ability to fight infections. Try to avoid being around people who are sick. This medicine may increase your risk to bruise or bleed. Call your doctor or health care professional if you notice any unusual bleeding. Be careful brushing and flossing your teeth or using a toothpick because you may get an infection or bleed more easily. If you have any dental work done, tell your dentist you are receiving this medicine. Avoid taking products that contain aspirin, acetaminophen, ibuprofen, naproxen, or ketoprofen unless instructed by your doctor. These medicines may hide a fever. Do not become pregnant while taking this medicine. Women should inform their doctor if they wish to become pregnant or think they might be pregnant. There is a potential for serious side effects to an unborn child. Talk to your health care professional or pharmacist for more information. Do not breast-feed an infant while taking this medicine. Call your doctor or health care professional if you get diarrhea. Do not treat yourself. What side effects may I notice from receiving this medicine? Side effects that you should report to your doctor or health care  professional as soon as possible: -allergic reactions like skin rash, itching or hives, swelling of the face, lips, or tongue -low blood counts - This drug may decrease the number of white blood cells, red blood cells and platelets. You may be at increased risk for infections and bleeding. -signs of infection - fever or chills, cough, sore throat, pain or difficulty passing urine -signs of decreased platelets or bleeding - bruising, pinpoint red spots on the skin, black, tarry stools, nosebleeds -signs of decreased red blood cells - unusually weak or tired, fainting spells, lightheadedness -breathing problems -chest pain, pressure -cough -diarrhea -jaw tightness -mouth sores -nausea and vomiting -pain, swelling, redness or irritation at the injection site -pain, tingling, numbness in the hands or feet -problems with balance, talking, walking -redness, blistering, peeling or loosening of the skin, including inside the mouth -trouble passing urine or change in the amount of urine Side effects that usually do  not require medical attention (report to your doctor or health care professional if they continue or are bothersome): -changes in vision -constipation -hair loss -loss of appetite -metallic taste in the mouth or changes in taste -stomach pain This list may not describe all possible side effects. Call your doctor for medical advice about side effects. You may report side effects to FDA at 1-800-FDA-1088. Where should I keep my medicine? This drug is given in a hospital or clinic and will not be stored at home. NOTE: This sheet is a summary. It may not cover all possible information. If you have questions about this medicine, talk to your doctor, pharmacist, or health care provider.    2016, [employer] Standard. (2007-08-20 17:22:47) Leucovorin injection What is this medicine? LEUCOVORIN (loo koe VOR in) is used to prevent or treat the harmful effects of some medicines. This  medicine is used to treat anemia caused by a low amount of folic acid in the body. It is also used with 5-fluorouracil (5-FU) to treat colon cancer. This medicine may be used for other purposes; ask your health care provider or pharmacist if you have questions. What should I tell my health care provider before I take this medicine? They need to know if you have any of these conditions: -anemia from low levels of vitamin B-12 in the blood -an unusual or allergic reaction to leucovorin, folic acid, other medicines, foods, dyes, or preservatives -pregnant or trying to get pregnant -breast-feeding How should I use this medicine? This medicine is for injection into a muscle or into a vein. It is given by a health care professional in a hospital or clinic setting. Talk to your pediatrician regarding the use of this medicine in children. Special care may be needed. Overdosage: If you think you have taken too much of this medicine contact a poison control center or emergency room at once. NOTE: This medicine is only for you. Do not share this medicine with others. What if I miss a dose? This does not apply. What may interact with this medicine? -capecitabine -fluorouracil -phenobarbital -phenytoin -primidone -trimethoprim-sulfamethoxazole This list may not describe all possible interactions. Give your health care provider a list of all the medicines, herbs, non-prescription drugs, or dietary supplements you use. Also tell them if you smoke, drink alcohol, or use illegal drugs. Some items may interact with your medicine. What should I watch for while using this medicine? Your condition will be monitored carefully while you are receiving this medicine. This medicine may increase the side effects of 5-fluorouracil, 5-FU. Tell your doctor or health care professional if you have diarrhea or mouth sores that do not get better or that get worse. What side effects may I notice from receiving this  medicine? Side effects that you should report to your doctor or health care professional as soon as possible: -allergic reactions like skin rash, itching or hives, swelling of the face, lips, or tongue -breathing problems -fever, infection -mouth sores -unusual bleeding or bruising -unusually weak or tired Side effects that usually do not require medical attention (report to your doctor or health care professional if they continue or are bothersome): -constipation or diarrhea -loss of appetite -nausea, vomiting This list may not describe all possible side effects. Call your doctor for medical advice about side effects. You may report side effects to FDA at 1-800-FDA-1088. Where should I keep my medicine? This drug is given in a hospital or clinic and will not be stored at home. NOTE: This sheet is  a summary. It may not cover all possible information. If you have questions about this medicine, talk to your doctor, pharmacist, or health care provider.    2016, Else[employer]. (2007-07-30 16:50:29) Fluorouracil, 5-FU injection What is this medicine? FLUOROURACIL, 5-FU (flure oh YOOR a sil) is a chemotherapy drug. It slows the growth of cancer cells. This medicine is used to treat many types of cancer like breast cancer, colon or rectal cancer, pancreatic cancer, and stomach cancer. This medicine may be used for other purposes; ask your health care provider or pharmacist if you have questions. What should I tell my health care provider before I take this medicine? They need to know if you have any of these conditions: -blood disorders -dihydropyrimidine dehydrogenase (DPD) deficiency -infection (especially a virus infection such as chickenpox, cold sores, or herpes) -kidney disease -liver disease -malnourished, poor nutrition -recent or ongoing radiation therapy -an unusual or allergic reaction to fluorouracil, other chemotherapy, other medicines, foods, dyes, or  preservatives -pregnant or trying to get pregnant -breast-feeding How should I use this medicine? This drug is given as an infusion or injection into a vein. It is administered in a hospital or clinic by a specially trained health care professional. Talk to your pediatrician regarding the use of this medicine in children. Special care may be needed. Overdosage: If you think you have taken too much of this medicine contact a poison control center or emergency room at once. NOTE: This medicine is only for you. Do not share this medicine with others. What if I miss a dose? It is important not to miss your dose. Call your doctor or health care professional if you are unable to keep an appointment. What may interact with this medicine? -allopurinol -cimetidine -dapsone -digoxin -hydroxyurea -leucovorin -levamisole -medicines for seizures like ethotoin, fosphenytoin, phenytoin -medicines to increase blood counts like filgrastim, pegfilgrastim, sargramostim -medicines that treat or prevent blood clots like warfarin, enoxaparin, and dalteparin -methotrexate -metronidazole -pyrimethamine -some other chemotherapy drugs like busulfan, cisplatin, estramustine, vinblastine -trimethoprim -trimetrexate -vaccines Talk to your doctor or health care professional before taking any of these medicines: -acetaminophen -aspirin -ibuprofen -ketoprofen -naproxen This list may not describe all possible interactions. Give your health care provider a list of all the medicines, herbs, non-prescription drugs, or dietary supplements you use. Also tell them if you smoke, drink alcohol, or use illegal drugs. Some items may interact with your medicine. What should I watch for while using this medicine? Visit your doctor for checks on your progress. This drug may make you feel generally unwell. This is not uncommon, as chemotherapy can affect healthy cells as well as cancer cells. Report any side effects. Continue  your course of treatment even though you feel ill unless your doctor tells you to stop. In some cases, you may be given additional medicines to help with side effects. Follow all directions for their use. Call your doctor or health care professional for advice if you get a fever, chills or sore throat, or other symptoms of a cold or flu. Do not treat yourself. This drug decreases your body's ability to fight infections. Try to avoid being around people who are sick. This medicine may increase your risk to bruise or bleed. Call your doctor or health care professional if you notice any unusual bleeding. Be careful brushing and flossing your teeth or using a toothpick because you may get an infection or bleed more easily. If you have any dental work done, tell your dentist you are receiving this  medicine. Avoid taking products that contain aspirin, acetaminophen, ibuprofen, naproxen, or ketoprofen unless instructed by your doctor. These medicines may hide a fever. Do not become pregnant while taking this medicine. Women should inform their doctor if they wish to become pregnant or think they might be pregnant. There is a potential for serious side effects to an unborn child. Talk to your health care professional or pharmacist for more information. Do not breast-feed an infant while taking this medicine. Men should inform their doctor if they wish to father a child. This medicine may lower sperm counts. Do not treat diarrhea with over the counter products. Contact your doctor if you have diarrhea that lasts more than 2 days or if it is severe and watery. This medicine can make you more sensitive to the sun. Keep out of the sun. If you cannot avoid being in the sun, wear protective clothing and use sunscreen. Do not use sun lamps or tanning beds/booths. What side effects may I notice from receiving this medicine? Side effects that you should report to your doctor or health care professional as soon as  possible: -allergic reactions like skin rash, itching or hives, swelling of the face, lips, or tongue -low blood counts - this medicine may decrease the number of white blood cells, red blood cells and platelets. You may be at increased risk for infections and bleeding. -signs of infection - fever or chills, cough, sore throat, pain or difficulty passing urine -signs of decreased platelets or bleeding - bruising, pinpoint red spots on the skin, black, tarry stools, blood in the urine -signs of decreased red blood cells - unusually weak or tired, fainting spells, lightheadedness -breathing problems -changes in vision -chest pain -mouth sores -nausea and vomiting -pain, swelling, redness at site where injected -pain, tingling, numbness in the hands or feet -redness, swelling, or sores on hands or feet -stomach pain -unusual bleeding Side effects that usually do not require medical attention (report to your doctor or health care professional if they continue or are bothersome): -changes in finger or toe nails -diarrhea -dry or itchy skin -hair loss -headache -loss of appetite -sensitivity of eyes to the light -stomach upset -unusually teary eyes This list may not describe all possible side effects. Call your doctor for medical advice about side effects. You may report side effects to FDA at 1-800-FDA-1088. Where should I keep my medicine? This drug is given in a hospital or clinic and will not be stored at home. NOTE: This sheet is a summary. It may not cover all possible information. If you have questions about this medicine, talk to your doctor, pharmacist, or health care provider.    2016, [employer]. (2007-05-29 13:53:16) Ondansetron tablets What is this medicine? ONDANSETRON (on DAN se tron) is used to treat nausea and vomiting caused by chemotherapy. It is also used to prevent or treat nausea and vomiting after surgery. This medicine may be used for other purposes;  ask your health care provider or pharmacist if you have questions. What should I tell my health care provider before I take this medicine? They need to know if you have any of these conditions: -heart disease -history of irregular heartbeat -liver disease -low levels of magnesium or potassium in the blood -an unusual or allergic reaction to ondansetron, granisetron, other medicines, foods, dyes, or preservatives -pregnant or trying to get pregnant -breast-feeding How should I use this medicine? Take this medicine by mouth with a glass of water. Follow the directions on your prescription  label. Take your doses at regular intervals. Do not take your medicine more often than directed. Talk to your pediatrician regarding the use of this medicine in children. Special care may be needed. Overdosage: If you think you have taken too much of this medicine contact a poison control center or emergency room at once. NOTE: This medicine is only for you. Do not share this medicine with others. What if I miss a dose? If you miss a dose, take it as soon as you can. If it is almost time for your next dose, take only that dose. Do not take double or extra doses. What may interact with this medicine? Do not take this medicine with any of the following medications: -apomorphine -certain medicines for fungal infections like fluconazole, itraconazole, ketoconazole, posaconazole, voriconazole -cisapride -dofetilide -dronedarone -pimozide -thioridazine -ziprasidone This medicine may also interact with the following medications: -carbamazepine -certain medicines for depression, anxiety, or psychotic disturbances -fentanyl -linezolid -MAOIs like Carbex, Eldepryl, Marplan, Nardil, and Parnate -methylene blue (injected into a vein) -other medicines that prolong the QT interval (cause an abnormal heart rhythm) -phenytoin -rifampicin -tramadol This list may not describe all possible interactions. Give your  health care provider a list of all the medicines, herbs, non-prescription drugs, or dietary supplements you use. Also tell them if you smoke, drink alcohol, or use illegal drugs. Some items may interact with your medicine. What should I watch for while using this medicine? Check with your doctor or health care professional right away if you have any sign of an allergic reaction. What side effects may I notice from receiving this medicine? Side effects that you should report to your doctor or health care professional as soon as possible: -allergic reactions like skin rash, itching or hives, swelling of the face, lips or tongue -breathing problems -confusion -dizziness -fast or irregular heartbeat -feeling faint or lightheaded, falls -fever and chills -loss of balance or coordination -seizures -sweating -swelling of the hands or feet -tightness in the chest -tremors -unusually weak or tired Side effects that usually do not require medical attention (report to your doctor or health care professional if they continue or are bothersome): -constipation or diarrhea -headache This list may not describe all possible side effects. Call your doctor for medical advice about side effects. You may report side effects to FDA at 1-800-FDA-1088. Where should I keep my medicine? Keep out of the reach of children. Store between 2 and 30 degrees C (36 and 86 degrees F). Throw away any unused medicine after the expiration date. NOTE: This sheet is a summary. It may not cover all possible information. If you have questions about this medicine, talk to your doctor, pharmacist, or health care provider.    2016, [employer] Standard. (2012-10-30 16:27:45) Prochlorperazine tablets What is this medicine? PROCHLORPERAZINE (proe klor PER a zeen) helps to control severe nausea and vomiting. This medicine is also used to treat schizophrenia. It can also help patients who experience anxiety that is not due to  psychological illness. This medicine may be used for other purposes; ask your health care provider or pharmacist if you have questions. What should I tell my health care provider before I take this medicine? They need to know if you have any of these conditions: -blood disorders or disease -dementia -liver disease or jaundice -Parkinson's disease -uncontrollable movement disorder -an unusual or allergic reaction to prochlorperazine, other medicines, foods, dyes, or preservatives -pregnant or trying to get pregnant -breast-feeding How should I use this medicine? Take this medicine  by mouth with a glass of water. Follow the directions on the prescription label. Take your doses at regular intervals. Do not take your medicine more often than directed. Do not stop taking this medicine suddenly. This can cause nausea, vomiting, and dizziness. Ask your doctor or health care professional for advice. Talk to your pediatrician regarding the use of this medicine in children. Special care may be needed. While this drug may be prescribed for children as young as 2 years for selected conditions, precautions do apply. Overdosage: If you think you have taken too much of this medicine contact a poison control center or emergency room at once. NOTE: This medicine is only for you. Do not share this medicine with others. What if I miss a dose? If you miss a dose, take it as soon as you can. If it is almost time for your next dose, take only that dose. Do not take double or extra doses. What may interact with this medicine? Do not take this medicine with any of the following medications: -amoxapine -antidepressants like citalopram, escitalopram, fluoxetine, paroxetine, and sertraline -deferoxamine -dofetilide -maprotiline -tricyclic antidepressants like amitriptyline, clomipramine, imipramine, nortiptyline and others This medicine may also interact with the following medications: -lithium -medicines for  pain -phenytoin -propranolol -warfarin This list may not describe all possible interactions. Give your health care provider a list of all the medicines, herbs, non-prescription drugs, or dietary supplements you use. Also tell them if you smoke, drink alcohol, or use illegal drugs. Some items may interact with your medicine. What should I watch for while using this medicine? Visit your doctor or health care professional for regular checks on your progress. You may get drowsy or dizzy. Do not drive, use machinery, or do anything that needs mental alertness until you know how this medicine affects you. Do not stand or sit up quickly, especially if you are an older patient. This reduces the risk of dizzy or fainting spells. Alcohol may interfere with the effect of this medicine. Avoid alcoholic drinks. This medicine can reduce the response of your body to heat or cold. Dress warm in cold weather and stay hydrated in hot weather. If possible, avoid extreme temperatures like saunas, hot tubs, very hot or cold showers, or activities that can cause dehydration such as vigorous exercise. This medicine can make you more sensitive to the sun. Keep out of the sun. If you cannot avoid being in the sun, wear protective clothing and use sunscreen. Do not use sun lamps or tanning beds/booths. Your mouth may get dry. Chewing sugarless gum or sucking hard candy, and drinking plenty of water may help. Contact your doctor if the problem does not go away or is severe. What side effects may I notice from receiving this medicine? Side effects that you should report to your doctor or health care professional as soon as possible: -blurred vision -breast enlargement in men or women -breast milk in women who are not breast-feeding -chest pain, fast or irregular heartbeat -confusion, restlessness -dark yellow or brown urine -difficulty breathing or swallowing -dizziness or fainting spells -drooling, shaking, movement  difficulty (shuffling walk) or rigidity -fever, chills, sore throat -involuntary or uncontrollable movements of the eyes, mouth, head, arms, and legs -seizures -stomach area pain -unusually weak or tired -unusual bleeding or bruising -yellowing of skin or eyes Side effects that usually do not require medical attention (report to your doctor or health care professional if they continue or are bothersome): -difficulty passing urine -difficulty sleeping -headache -sexual dysfunction -  skin rash, or itching This list may not describe all possible side effects. Call your doctor for medical advice about side effects. You may report side effects to FDA at 1-800-FDA-1088. Where should I keep my medicine? Keep out of the reach of children. Store at room temperature between 15 and 30 degrees C (59 and 86 degrees F). Protect from light. Throw away any unused medicine after the expiration date. NOTE: This sheet is a summary. It may not cover all possible information. If you have questions about this medicine, talk to your doctor, pharmacist, or health care provider.    2016, [employer] Standard. (2011-06-13 16:59:39) Lidocaine; Prilocaine cream What is this medicine? LIDOCAINE; PRILOCAINE (LYE doe kane; PRIL oh kane) is a topical anesthetic that causes loss of feeling in the skin and surrounding tissues. It is used to numb the skin before procedures or injections. This medicine may be used for other purposes; ask your health care provider or pharmacist if you have questions. What should I tell my health care provider before I take this medicine? They need to know if you have any of these conditions: -glucose-6-phosphate deficiencies -heart disease -kidney or liver disease -methemoglobinemia -an unusual or allergic reaction to lidocaine, prilocaine, other medicines, foods, dyes, or preservatives -pregnant or trying to get pregnant -breast-feeding How should I use this medicine? This medicine  is for external use only on the skin. Do not take by mouth. Follow the directions on the prescription label. Wash hands before and after use. Do not use more or leave in contact with the skin longer than directed. Do not apply to eyes or open wounds. It can cause irritation and blurred or temporary loss of vision. If this medicine comes in contact with your eyes, immediately rinse the eye with water. Do not touch or rub the eye. Contact your health care provider right away. Talk to your pediatrician regarding the use of this medicine in children. While this medicine may be prescribed for children for selected conditions, precautions do apply. Overdosage: If you think you have taken too much of this medicine contact a poison control center or emergency room at once. NOTE: This medicine is only for you. Do not share this medicine with others. What if I miss a dose? This medicine is usually only applied once prior to each procedure. It must be in contact with the skin for a period of time for it to work. If you applied this medicine later than directed, tell your health care professional before starting the procedure. What may interact with this medicine? -acetaminophen -chloroquine -dapsone -medicines to control heart rhythm -nitrates like nitroglycerin and nitroprusside -other ointments, creams, or sprays that may contain anesthetic medicine -phenobarbital -phenytoin -quinine -sulfonamides like sulfacetamide, sulfamethoxazole, sulfasalazine and others This list may not describe all possible interactions. Give your health care provider a list of all the medicines, herbs, non-prescription drugs, or dietary supplements you use. Also tell them if you smoke, drink alcohol, or use illegal drugs. Some items may interact with your medicine. What should I watch for while using this medicine? Be careful to avoid injury to the treated area while it is numb and you are not aware of pain. Avoid scratching,  rubbing, or exposing the treated area to hot or cold temperatures until complete sensation has returned. The numb feeling will wear off a few hours after applying the cream. What side effects may I notice from receiving this medicine? Side effects that you should report to your doctor or health care  professional as soon as possible: -blurred vision -chest pain -difficulty breathing -dizziness -drowsiness -fast or irregular heartbeat -skin rash or itching -swelling of your throat, lips, or face -trembling Side effects that usually do not require medical attention (report to your doctor or health care professional if they continue or are bothersome): -changes in ability to feel hot or cold -redness and swelling at the application site This list may not describe all possible side effects. Call your doctor for medical advice about side effects. You may report side effects to FDA at 1-800-FDA-1088. Where should I keep my medicine? Keep out of reach of children. Store at room temperature between 15 and 30 degrees C (59 and 86 degrees F). Keep container tightly closed. Throw away any unused medicine after the expiration date. NOTE: This sheet is a summary. It may not cover all possible information. If you have questions about this medicine, talk to your doctor, pharmacist, or health care provider.    2016, [employer] Standard. (2007-07-29 17:14:35)

## 2015-03-22 NOTE — Progress Notes (Signed)
Chemo teaching done and consent signed for Oxaliplatin, Leucovorin, 5FU. Distress screening done. Calendar given to patient. Inbasket sent to [facility] for dietician consult on 03/30/15.

## 2015-03-23 ENCOUNTER — Encounter: Payer: Self-pay | Admitting: *Deleted

## 2015-03-23 ENCOUNTER — Ambulatory Visit (HOSPITAL_COMMUNITY): Payer: Self-pay | Admitting: Oncology

## 2015-03-23 ENCOUNTER — Inpatient Hospital Stay (HOSPITAL_COMMUNITY): Payer: Self-pay

## 2015-03-23 ENCOUNTER — Other Ambulatory Visit: Payer: Self-pay

## 2015-03-23 DIAGNOSIS — Z79899 Other long term (current) drug therapy: Secondary | ICD-10-CM | POA: Diagnosis not present

## 2015-03-23 DIAGNOSIS — E7800 Pure hypercholesterolemia, unspecified: Secondary | ICD-10-CM | POA: Diagnosis not present

## 2015-03-23 DIAGNOSIS — C184 Malignant neoplasm of transverse colon: Secondary | ICD-10-CM | POA: Diagnosis not present

## 2015-03-23 DIAGNOSIS — Z806 Family history of leukemia: Secondary | ICD-10-CM | POA: Diagnosis not present

## 2015-03-23 DIAGNOSIS — Z9049 Acquired absence of other specified parts of digestive tract: Secondary | ICD-10-CM | POA: Diagnosis not present

## 2015-03-23 DIAGNOSIS — Z9889 Other specified postprocedural states: Secondary | ICD-10-CM | POA: Diagnosis not present

## 2015-03-23 DIAGNOSIS — Z8249 Family history of ischemic heart disease and other diseases of the circulatory system: Secondary | ICD-10-CM | POA: Diagnosis not present

## 2015-03-23 DIAGNOSIS — F4310 Post-traumatic stress disorder, unspecified: Secondary | ICD-10-CM | POA: Diagnosis not present

## 2015-03-23 DIAGNOSIS — F1020 Alcohol dependence, uncomplicated: Secondary | ICD-10-CM | POA: Diagnosis not present

## 2015-03-23 DIAGNOSIS — Z803 Family history of malignant neoplasm of breast: Secondary | ICD-10-CM | POA: Diagnosis not present

## 2015-03-23 DIAGNOSIS — F329 Major depressive disorder, single episode, unspecified: Secondary | ICD-10-CM | POA: Diagnosis not present

## 2015-03-23 DIAGNOSIS — F17210 Nicotine dependence, cigarettes, uncomplicated: Secondary | ICD-10-CM | POA: Diagnosis not present

## 2015-03-23 DIAGNOSIS — Z87820 Personal history of traumatic brain injury: Secondary | ICD-10-CM | POA: Diagnosis not present

## 2015-03-23 DIAGNOSIS — Z452 Encounter for adjustment and management of vascular access device: Secondary | ICD-10-CM | POA: Diagnosis present

## 2015-03-23 DIAGNOSIS — I10 Essential (primary) hypertension: Secondary | ICD-10-CM | POA: Diagnosis not present

## 2015-03-23 DIAGNOSIS — K219 Gastro-esophageal reflux disease without esophagitis: Secondary | ICD-10-CM | POA: Diagnosis not present

## 2015-03-23 NOTE — Patient Instructions (Signed)
  Your procedure is scheduled on: 03/24/15 Report to Day Surgery. [facility]OND FLOOR To find out your arrival time please call [phone] between 1PM - 3PM on 03/23/15.  Remember: Instructions that are not followed completely may result in serious medical risk, up to and including death, or upon the discretion of your surgeon and anesthesiologist your surgery may need to be rescheduled.    __X__ 1. Do not eat food or drink liquids after midnight. No gum chewing or hard candies.     __X_ 2. No Alcohol for 24 hours before or after surgery.   ____ 3. Bring all medications with you on the day of surgery if instructed.    __X__ 4. Notify your doctor if there is any change in your medical condition     (cold, fever, infections).     Do not wear jewelry, make-up, hairpins, clips or nail polish.  Do not wear lotions, powders, or perfumes. You may wear deodorant.  Do not shave 48 hours prior to surgery. Men may shave face and neck.  Do not bring valuables to the hospital.    [facility] is not responsible for any belongings or valuables.               Contacts, dentures or bridgework may not be worn into surgery.  Leave your suitcase in the car. After surgery it may be brought to your room.  For patients admitted to the hospital, discharge time is determined by your                treatment team.   Patients discharged the day of surgery will not be allowed to drive home.   Please read over the following fact sheets that you were given:   Surgical Site Infection Prevention   ____ Take these medicines the morning of surgery with A SIP OF WATER:    1.LIPITOR  2. PROZAC  3. CRESTOR  4.  5.  6.  ____ Fleet Enema (as directed)   ____ Use CHG Soap as directed  _X___ Use inhalers on the day of surgery  ____ Stop metformin 2 days prior to surgery    ____ Take 1/2 of usual insulin dose the night before surgery and none on the morning of surgery.   ____ Stop  Coumadin/Plavix/aspirin on   ____ Stop Anti-inflammatories on    ____ Stop supplements until after surgery.    ____ Bring C-Pap to the hospital.

## 2015-03-23 NOTE — Progress Notes (Signed)
[facility] Psychosocial Distress Screening Clinical Social Work  Clinical Social Work was referred by distress screening protocol.  The patient scored a 5 on the Psychosocial Distress Thermometer which indicates moderate distress. Clinical Social Worker phoned pt at home to check in and to assess for distress and other psychosocial needs. CSW had met pt at first appt. And had reviewed local resources. Pt well connected in the local area and has congregational RN/CSW support. Pt was reviewing his schedule with that worker as we were on the phone. Pt reports to have rides to appointments. Pt has applied for MCD and it is pending. Pt appreciated call and agrees to reach out as needed.    ONCBCN DISTRESS SCREENING 03/22/2015  Screening Type Initial Screening  Distress experienced in past week (1-10) 5  Practical problem type Insurance  Emotional problem type Depression;Nervousness/Anxiety;Adjusting to illness  Physical Problem type Sleep/insomnia;Skin dry/itchy;Pain  Referral to clinical social work Yes  Referral to dietition Yes  Referral to financial advocate Yes    Clinical Social Worker follow up needed: Yes.    If yes, follow up plan: See above Loren Racer, [place] Tuesdays   [phone]

## 2015-03-24 ENCOUNTER — Ambulatory Visit: Payer: Medicaid Other

## 2015-03-24 ENCOUNTER — Encounter
Admission: RE | Disposition: A | Discharge status: Home / Self Care | Payer: Self-pay | Source: Ambulatory Visit | Attending: Surgery

## 2015-03-24 ENCOUNTER — Encounter: Payer: Self-pay | Admitting: *Deleted

## 2015-03-24 ENCOUNTER — Ambulatory Visit: Payer: Medicaid Other | Admitting: Anesthesiology

## 2015-03-24 ENCOUNTER — Ambulatory Visit
Admission: RE | Admit: 2015-03-24 | Discharge: 2015-03-24 | Disposition: A | Discharge status: Home / Self Care | Payer: Medicaid Other | Source: Ambulatory Visit | Attending: Surgery | Admitting: Surgery

## 2015-03-24 ENCOUNTER — Other Ambulatory Visit: Payer: Self-pay

## 2015-03-24 DIAGNOSIS — C184 Malignant neoplasm of transverse colon: Secondary | ICD-10-CM | POA: Insufficient documentation

## 2015-03-24 DIAGNOSIS — I10 Essential (primary) hypertension: Secondary | ICD-10-CM | POA: Insufficient documentation

## 2015-03-24 DIAGNOSIS — F1020 Alcohol dependence, uncomplicated: Secondary | ICD-10-CM | POA: Insufficient documentation

## 2015-03-24 DIAGNOSIS — F329 Major depressive disorder, single episode, unspecified: Secondary | ICD-10-CM | POA: Insufficient documentation

## 2015-03-24 DIAGNOSIS — F17210 Nicotine dependence, cigarettes, uncomplicated: Secondary | ICD-10-CM | POA: Insufficient documentation

## 2015-03-24 DIAGNOSIS — C187 Malignant neoplasm of sigmoid colon: Secondary | ICD-10-CM | POA: Insufficient documentation

## 2015-03-24 DIAGNOSIS — Z9889 Other specified postprocedural states: Secondary | ICD-10-CM | POA: Insufficient documentation

## 2015-03-24 DIAGNOSIS — Z803 Family history of malignant neoplasm of breast: Secondary | ICD-10-CM | POA: Insufficient documentation

## 2015-03-24 DIAGNOSIS — F4310 Post-traumatic stress disorder, unspecified: Secondary | ICD-10-CM | POA: Insufficient documentation

## 2015-03-24 DIAGNOSIS — Z87820 Personal history of traumatic brain injury: Secondary | ICD-10-CM | POA: Insufficient documentation

## 2015-03-24 DIAGNOSIS — Z806 Family history of leukemia: Secondary | ICD-10-CM | POA: Insufficient documentation

## 2015-03-24 DIAGNOSIS — Z9049 Acquired absence of other specified parts of digestive tract: Secondary | ICD-10-CM | POA: Insufficient documentation

## 2015-03-24 DIAGNOSIS — K219 Gastro-esophageal reflux disease without esophagitis: Secondary | ICD-10-CM | POA: Insufficient documentation

## 2015-03-24 DIAGNOSIS — Z79899 Other long term (current) drug therapy: Secondary | ICD-10-CM | POA: Insufficient documentation

## 2015-03-24 DIAGNOSIS — E7800 Pure hypercholesterolemia, unspecified: Secondary | ICD-10-CM | POA: Insufficient documentation

## 2015-03-24 DIAGNOSIS — Z8249 Family history of ischemic heart disease and other diseases of the circulatory system: Secondary | ICD-10-CM | POA: Insufficient documentation

## 2015-03-24 HISTORY — PX: PORTACATH PLACEMENT: SHX2246

## 2015-03-24 SURGERY — INSERTION, TUNNELED CENTRAL VENOUS DEVICE, WITH PORT
Anesthesia: General

## 2015-03-24 MED ORDER — CEFAZOLIN SODIUM-DEXTROSE 2-3 GM-% IV SOLR
2.0000 g | INTRAVENOUS | Status: AC
  Administered 2015-03-24: 2 g via INTRAVENOUS

## 2015-03-24 MED ORDER — CHLORHEXIDINE GLUCONATE 4 % EX LIQD: 1.0000 "application " | Freq: Once | CUTANEOUS | Status: DC

## 2015-03-24 MED ORDER — BUPIVACAINE-EPINEPHRINE (PF) 0.25% -1:200000 IJ SOLN
INTRAMUSCULAR | Status: AC
  Filled 2015-03-24: qty 30

## 2015-03-24 MED ORDER — FENTANYL CITRATE (PF) 100 MCG/2ML IJ SOLN
INTRAMUSCULAR | Status: DC | PRN
  Administered 2015-03-24: 25 ug via INTRAVENOUS
  Administered 2015-03-24: 50 ug via INTRAVENOUS
  Administered 2015-03-24: 25 ug via INTRAVENOUS

## 2015-03-24 MED ORDER — LIDOCAINE HCL (PF) 1 % IJ SOLN
INTRAMUSCULAR | Status: DC | PRN
Start: 2015-03-24 — End: 2015-03-24
  Administered 2015-03-24: 5 mL

## 2015-03-24 MED ORDER — LACTATED RINGERS IV SOLN
INTRAVENOUS | Status: DC
  Administered 2015-03-24: 11:00:00 via INTRAVENOUS

## 2015-03-24 MED ORDER — FENTANYL CITRATE (PF) 100 MCG/2ML IJ SOLN
25.0000 ug | INTRAMUSCULAR | Status: DC | PRN
Start: 2015-03-24 — End: 2015-03-24

## 2015-03-24 MED ORDER — LIDOCAINE HCL (PF) 1 % IJ SOLN
INTRAMUSCULAR | Status: AC
  Filled 2015-03-24: qty 30

## 2015-03-24 MED ORDER — LIDOCAINE HCL (CARDIAC) 20 MG/ML IV SOLN
INTRAVENOUS | Status: DC | PRN
  Administered 2015-03-24: 80 mg via INTRAVENOUS

## 2015-03-24 MED ORDER — CEFAZOLIN SODIUM-DEXTROSE 2-3 GM-% IV SOLR
INTRAVENOUS | Status: AC
  Filled 2015-03-24: qty 50

## 2015-03-24 MED ORDER — HYDROCODONE-ACETAMINOPHEN 5-325 MG PO TABS: 1.0000 | ORAL_TABLET | Freq: Four times a day (QID) | ORAL | Status: DC | PRN

## 2015-03-24 MED ORDER — PROPOFOL 500 MG/50ML IV EMUL
INTRAVENOUS | Status: DC | PRN
  Administered 2015-03-24: 75 ug/kg/min via INTRAVENOUS

## 2015-03-24 MED ORDER — FAMOTIDINE 20 MG PO TABS
20.0000 mg | ORAL_TABLET | Freq: Once | ORAL | Status: AC
  Administered 2015-03-24: 20 mg via ORAL

## 2015-03-24 MED ORDER — ONDANSETRON HCL 4 MG/2ML IJ SOLN: 4.0000 mg | Freq: Once | INTRAMUSCULAR | Status: DC | PRN

## 2015-03-24 MED ORDER — HEPARIN SODIUM (PORCINE) 5000 UNIT/ML IJ SOLN
INTRAMUSCULAR | Status: AC
  Filled 2015-03-24: qty 1

## 2015-03-24 MED ORDER — HYDROCODONE-ACETAMINOPHEN 5-325 MG PO TABS
1.0000 | ORAL_TABLET | Freq: Four times a day (QID) | ORAL | Status: DC | PRN
  Administered 2015-03-24: 1 via ORAL

## 2015-03-24 MED ORDER — FAMOTIDINE 20 MG PO TABS
ORAL_TABLET | ORAL | Status: AC
  Filled 2015-03-24: qty 1

## 2015-03-24 MED ORDER — MIDAZOLAM HCL 2 MG/2ML IJ SOLN
INTRAMUSCULAR | Status: DC | PRN
  Administered 2015-03-24: 2 mg via INTRAVENOUS

## 2015-03-24 MED ORDER — HYDROCODONE-ACETAMINOPHEN 5-325 MG PO TABS
ORAL_TABLET | ORAL | Status: AC
Start: 2015-03-24 — End: 2015-03-24
  Administered 2015-03-24: 1 via ORAL
  Filled 2015-03-24: qty 1

## 2015-03-24 MED ORDER — BUPIVACAINE-EPINEPHRINE (PF) 0.25% -1:200000 IJ SOLN
INTRAMUSCULAR | Status: DC | PRN
  Administered 2015-03-24: 5 mL via PERINEURAL

## 2015-03-24 SURGICAL SUPPLY — 25 items
BAG DECANTER FOR FLEXI CONT (MISCELLANEOUS) ×3 IMPLANT
BLADE SURG SZ11 CARB STEEL (BLADE) ×3 IMPLANT
CANISTER SUCT 1200ML W/VALVE (MISCELLANEOUS) ×3 IMPLANT
CHLORAPREP W/TINT 26ML (MISCELLANEOUS) ×3 IMPLANT
COVER PROBE ULTRASOUND XLONG L (MISCELLANEOUS) ×3 IMPLANT
DRAPE C-ARM XRAY 36X54 (DRAPES) ×3 IMPLANT
DRAPE INCISE IOBAN 66X45 STRL (DRAPES) ×6 IMPLANT
DRAPE LAPAROTOMY 100X77 ABD (DRAPES) ×3 IMPLANT
GLOVE BIO SURGEON STRL SZ7 (GLOVE) ×3 IMPLANT
GOWN STRL REUS W/ TWL LRG LVL3 (GOWN DISPOSABLE) ×2 IMPLANT
GOWN STRL REUS W/TWL LRG LVL3 (GOWN DISPOSABLE) ×4
KIT PORT POWER 8FR ISP CVUE (Catheter) ×3 IMPLANT
LIQUID BAND (GAUZE/BANDAGES/DRESSINGS) ×3 IMPLANT
NEEDLE HYPO 25X1 1.5 SAFETY (NEEDLE) ×3 IMPLANT
NS IRRIG 1000ML POUR BTL (IV SOLUTION) ×3 IMPLANT
PAD GROUND ADULT SPLIT (MISCELLANEOUS) ×3 IMPLANT
SUT MNCRL AB 4-0 PS2 18 (SUTURE) ×3 IMPLANT
SUT PROLENE 2 0 CT2 30 (SUTURE) ×3 IMPLANT
SUT VIC AB 2-0 SH 27 (SUTURE)
SUT VIC AB 2-0 SH 27XBRD (SUTURE) IMPLANT
SUT VIC AB 3-0 SH 27 (SUTURE) ×2
SUT VIC AB 3-0 SH 27X BRD (SUTURE) ×1 IMPLANT
SYR 20CC LL (SYRINGE) ×3 IMPLANT
SYR 5ML LL (SYRINGE) ×3 IMPLANT
TOWEL OR 17X26 4PK STRL BLUE (TOWEL DISPOSABLE) ×3 IMPLANT

## 2015-03-24 NOTE — OR Nursing (Signed)
5000 unit heparin mixed in 548ml NaCl as irrigation

## 2015-03-24 NOTE — Anesthesia Preprocedure Evaluation (Signed)
Anesthesia Evaluation  Patient identified by MRN, date of birth, ID band Patient awake    Reviewed: Allergy & Precautions, H&P , NPO status , Patient's Chart, lab work & pertinent test results, reviewed documented beta blocker date and time   History of Anesthesia Complications Negative for: history of anesthetic complications  Airway Mallampati: III  TM Distance: >3 FB Neck ROM: full    Dental no notable dental hx. (+) Missing, Poor Dentition   Pulmonary neg shortness of breath, asthma , neg sleep apnea, neg COPD, Recent URI , Resolved, former smoker,    Pulmonary exam normal breath sounds clear to auscultation       Cardiovascular Exercise Tolerance: Good hypertension, (-) angina+ CAD  (-) Past MI, (-) Cardiac Stents and (-) CABG Normal cardiovascular exam(-) dysrhythmias (-) Valvular Problems/Murmurs Rhythm:regular Rate:Normal     Neuro/Psych PSYCHIATRIC DISORDERS (Depression, PTSD) negative neurological ROS     GI/Hepatic Neg liver ROS, GERD  ,  Endo/Other  negative endocrine ROS  Renal/GU negative Renal ROS  negative genitourinary   Musculoskeletal   Abdominal   Peds  Hematology negative hematology ROS (+)   Anesthesia Other Findings Past Medical History:   Head trauma                                     2001           Comment:closed head injury; coma for 4 weeks   Depression                                                   Abnormal stress echocardiogram                               Hypercholesterolemia                                         Hypertension                                                 Alcohol abuse                                                  Comment:Heavy Use up until 2010   GERD (gastroesophageal reflux disease)                       PTSD (post-traumatic stress disorder)                        Anxiety  Adenocarcinoma of  transverse colon ([place])        02/10/2015     Reproductive/Obstetrics negative OB ROS                             Anesthesia Physical Anesthesia Plan  ASA: II  Anesthesia Plan: General   Post-op Pain Management:    Induction:   Airway Management Planned:   Additional Equipment:   Intra-op Plan:   Post-operative Plan:   Informed Consent: I have reviewed the patients History and Physical, chart, labs and discussed the procedure including the risks, benefits and alternatives for the proposed anesthesia with the patient or authorized representative who has indicated his/her understanding and acceptance.   Dental Advisory Given  Plan Discussed with: Anesthesiologist, CRNA and Surgeon  Anesthesia Plan Comments:         Anesthesia Quick Evaluation

## 2015-03-24 NOTE — Anesthesia Postprocedure Evaluation (Signed)
Anesthesia Post Note  Patient: Erik [name]  Procedure(s) Performed: Procedure(s) (LRB): INSERTION PORT-A-CATH (N/A)  Patient location during evaluation: PACU Anesthesia Type: General Level of consciousness: awake and alert Pain management: pain level controlled Vital Signs Assessment: post-procedure vital signs reviewed and stable Respiratory status: aerosol facemask Cardiovascular status: blood pressure returned to baseline and stable Postop Assessment: no signs of nausea or vomiting Anesthetic complications: no    Last Vitals:  Filed Vitals:   03/24/15 1316 03/24/15 1333  BP: 142/81 134/82  Pulse:    Temp:    Resp:  16    Last Pain:  Filed Vitals:   03/24/15 1334  PainSc: 3                  Martha Clan

## 2015-03-24 NOTE — Op Note (Signed)
  Pre-operative Diagnosis:  Post-operative Diagnosis:    Surgeon: Caroleen Hamman, MD FACS  Anesthesia: IV sedation, marcaine .25% w epi and lidocaine 1%  Procedure: right IJ  Port placement with fluoroscopy under U/S guidance  Findings: Good position of the tip of the catheter by fluoroscopy Patent Right IJ. Needle within lumen.  Estimated Blood Loss: Minimal         Drains: None         Specimens: None       Complications: none            Procedure Details  The patient was seen again in the Holding Room. The benefits, complications, treatment options, and expected outcomes were discussed with the patient. The risks of bleeding, infection, recurrence of symptoms, failure to resolve symptoms,  thrombosis nonfunction breakage pneumothorax hemopneumothorax any of which could require chest tube or further surgery were reviewed with the patient.   The patient was taken to Operating Room, identified as Erik [name] and the procedure verified.  A Time Out was held and the above information confirmed.  Prior to the induction of general anesthesia, antibiotic prophylaxis was administered. VTE prophylaxis was in place. Appropriate anesthesia was then administered and tolerated well. The chest was prepped with Chloraprep and draped in the sterile fashion. The patient was positioned in the supine position. Then the patient was placed in Trendelenburg position.  Patient was prepped and draped in sterile fashion and in a Trendelenburg position local anesthetic was infiltrated into the skin and subcutaneous tissues in the neck and anterior chest wall. The large bore needle was placed into the internal jugular vein under U/S guidance without difficulty and then the Seldinger wire was advanced. Fluoroscopy was utilized to confirm that the Seldinger wire was in the superior vena cava.  An incision was made and a port pocket developed with blunt and electrocautery dissection. The introducer dilator was  placed over the Seldinger wire the wire was removed. The previously flushed catheter was placed into the introducer dilator and the peel-away sheath was removed. The catheter length was confirmed and trimmed utilizing fluoroscopy for proper positioning. The catheter was then attached to the previously flushed port. The port was placed into the pocket. The port was held in with 2-0 Prolenes and flushed for function and heparin locked.  The wound was closed with interrupted 3-0 Vicryl followed by 4-0 subcuticular Monocryl sutures. Dermabond used to coat the skin  Patient was taken to the recovery room in stable condition where a postoperative chest film has been ordered.

## 2015-03-24 NOTE — Discharge Instructions (Signed)

## 2015-03-24 NOTE — Transfer of Care (Signed)
Immediate Anesthesia Transfer of Care Note  Patient: Erik [name]  Procedure(s) Performed: Procedure(s): INSERTION PORT-A-CATH (N/A)  Patient Location: PACU  Anesthesia Type:General  Level of Consciousness: awake, alert  and oriented  Airway & Oxygen Therapy: Patient Spontanous Breathing and Patient connected to face mask oxygen  Post-op Assessment: Report given to RN and Post -op Vital signs reviewed and stable  Post vital signs: Reviewed and stable  Last Vitals: 12:17 97.6 temp 89 hr 99% sat 137/87 20 resp Filed Vitals:   03/24/15 1034  BP: 143/84  Pulse: 88  Temp: 36.7 C  Resp: 16    Complications: No apparent anesthesia complications

## 2015-03-24 NOTE — Interval H&P Note (Signed)
History and Physical Interval Note:  03/24/2015 10:24 AM  Erik [name]  has presented today for surgery, with the diagnosis of ADENOCARCINOMA OF TRANSVERSE COLON  The various methods of treatment have been discussed with the patient and family. After consideration of risks( including but not limited to PTX, vascular injuries, bleeding and infection), benefits and other options for treatment, the patient has consented to  Procedure(s): INSERTION PORT-A-CATH (N/A) as a surgical intervention .  The patient's history has been reviewed, patient examined, no change in status, stable for surgery.  I have reviewed the patient's chart and labs.  Questions were answered to the patient's satisfaction.     [place]

## 2015-03-24 NOTE — H&P (View-Only) (Signed)
Subjective:     Patient ID: Erik [name], male   DOB: 10/26/1955, 60 y.o.   MRN: [id number]  HPI 60 year old with metastatic colon cancer status post sigmoidectomy with Dr. Adonis Huguenin previously. Patient has been healing well he does state that he gets early satiety, has been taking in protein shakes and able to eat and keep things down. Patient does state that he does have diarrhea about 4 times a day states it is not pure liquid but it is looser.. Patient has been continuing to change the dressing to his abdominal wound but states that there is minimal drainage to the area.  Past Medical History  Diagnosis Date  . Head trauma 2001    closed head injury; coma for 4 weeks  . Depression   . Abnormal stress echocardiogram   . Hypercholesterolemia   . Hypertension   . Alcohol abuse     Heavy Use up until 2010  . GERD (gastroesophageal reflux disease)   . PTSD (post-traumatic stress disorder)   . Anxiety   . Adenocarcinoma of transverse colon ([place]) 02/10/2015   Past Surgical History  Procedure Laterality Date  . Kidney surgery      >30 years ago  . Cardiac cath    . Craniotomy  2001  . Head injury surgery    . Colonoscopy  2011    Dr. Oneida Alar: multiple adenomas and hyperplastic polyps  . Colonoscopy with propofol N/A 01/12/2015    Procedure: COLONOSCOPY WITH PROPOFOL;  Surgeon: Danie Binder, MD;  Location: [facility];  Service: Endoscopy;  Laterality: N/A;  1030  . Esophagogastroduodenoscopy (egd) with propofol N/A 01/12/2015    Procedure: ESOPHAGOGASTRODUODENOSCOPY (EGD) WITH PROPOFOL;  Surgeon: Danie Binder, MD;  Location: [facility];  Service: Endoscopy;  Laterality: N/A;  . Hernia repair Right 2012    Inguinal- [place] Na  . Laparoscopic right hemi colectomy Left 02/10/2015    Procedure: LAPAROSCOPIC THEN OPEN RIGHT HEMI COLECTOMY;  Surgeon: [name], MD;  Location: [facility] ORS;  Service: General;  Laterality: Left;   Family History  Problem Relation Age of Onset  .  Ataxia Neg Hx   . Chorea Neg Hx   . Dementia Neg Hx   . Mental retardation Neg Hx   . Migraines Neg Hx   . Multiple sclerosis Neg Hx   . Neurofibromatosis Neg Hx   . Neuropathy Neg Hx   . Parkinsonism Neg Hx   . Seizures Neg Hx   . Stroke Neg Hx   . Colon cancer Neg Hx   . Pulmonary embolism Mother   . Breast cancer Mother   . Heart disease Father   . Cancer Father 89    Leukemia  . Cancer Mother   . Cancer Paternal Grandfather     Lung   Social History   Social History  . Marital Status: Divorced    Spouse Name: N/A  . Number of Children: N/A  . Years of Education: N/A   Occupational History  . Full time    Social History Main Topics  . Smoking status: Current Every Day Smoker -- 0.25 packs/day    Types: Cigarettes  . Smokeless tobacco: Never Used     Comment: patient is aware he needs to quit  . Alcohol Use: 0.0 oz/week    0 Standard drinks or equivalent per week     Comment: beer occ, history of ETOH abuse in remote past.   . Drug Use: Yes    Special: Marijuana  Comment: marijuana last 2012  . Sexual Activity:    Partners: Female   Other Topics Concern  . None   Social History Narrative   Single    Current outpatient prescriptions:  .  albuterol (PROVENTIL HFA;VENTOLIN HFA) 108 (90 BASE) MCG/ACT inhaler, Inhale 2 puffs into the lungs every 6 (six) hours as needed for wheezing or shortness of breath., Disp: 1 Inhaler, Rfl: 1 .  amitriptyline (ELAVIL) 25 MG tablet, TAKE ONE TABLET BY MOUTH AT BEDTIME. (Patient taking differently: TAKE ONE TABLET BY MOUTH AT BEDTIME. prn), Disp: 30 tablet, Rfl: 2 .  dexlansoprazole (DEXILANT) 60 MG capsule, Take 1 capsule (60 mg total) by mouth daily., Disp: 90 capsule, Rfl: 1 .  FLUoxetine (PROZAC) 20 MG capsule, Take 20 mg by mouth daily., Disp: , Rfl:  .  rosuvastatin (CRESTOR) 40 MG tablet, Take 1 tablet (40 mg total) by mouth daily., Disp: 90 tablet, Rfl: 1 .  sildenafil (VIAGRA) 100 MG tablet, Take 0.5-1 tablets  (50-100 mg total) by mouth daily as needed for erectile dysfunction., Disp: 10 tablet, Rfl: 1 No Known Allergies     Review of Systems  Constitutional: Negative for fever, chills, activity change, appetite change and fatigue.  HENT: Negative for congestion and sore throat.   Cardiovascular: Negative for chest pain, palpitations and leg swelling.  Gastrointestinal: Positive for abdominal pain, diarrhea and abdominal distention. Negative for nausea, vomiting, constipation and blood in stool.  Genitourinary: Negative for dysuria and hematuria.  Musculoskeletal: Negative for arthralgias and neck pain.  Skin: Negative for color change, pallor, rash and wound.  Neurological: Negative for dizziness and weakness.  Hematological: Negative for adenopathy. Does not bruise/bleed easily.  Psychiatric/Behavioral: Negative for agitation. The patient is not nervous/anxious.   All other systems reviewed and are negative.      Filed Vitals:   03/18/15 1424  BP: 132/84  Pulse: 94  Temp: 98.4 F (36.9 C)    Objective:   Physical Exam  Constitutional: He is oriented to person, place, and time. He appears well-developed and well-nourished. No distress.  HENT:  Head: Normocephalic and atraumatic.  Right Ear: External ear normal.  Left Ear: External ear normal.  Nose: Nose normal.  Mouth/Throat: Oropharynx is clear and moist. No oropharyngeal exudate.  Eyes: Conjunctivae are normal. Pupils are equal, round, and reactive to light. No scleral icterus.  Neck: Normal range of motion. Neck supple. No tracheal deviation present.  Cardiovascular: Normal rate, regular rhythm, normal heart sounds and intact distal pulses.   Pulmonary/Chest: Effort normal and breath sounds normal. No respiratory distress. He has no wheezes.  Abdominal: Soft. Bowel sounds are normal. He exhibits no distension. There is tenderness.  Midline wound healing well, inferior portion with small area of granulation tissue, silver  nitrate applied to the area  Musculoskeletal: Normal range of motion. He exhibits no edema or tenderness.  Neurological: He is alert and oriented to person, place, and time. No cranial nerve deficit.  Skin: Skin is warm and dry. No rash noted. No erythema.  Psychiatric: He has a normal mood and affect. His behavior is normal. Judgment and thought content normal.  Vitals reviewed.      Assessment:     60 yr old metastatic colon cancer.    Plan:     Discussed with the patient that his wound is healing well he can change over to dry dressings.  Also called and discussed with patient's oncologist Dr. Whitney Muse at [facility]. They would like to start chemotherapy on  him requesting [place] to place a port. I discussed the risk benefits alternatives and expected outcomes with the patient including bleeding infection, need for further procedures need for removal of a port and pneumothorax as well as potential need for chest tube placement if this were to occur.  Patient was given opportunity to ask questions and have them answered. Patient is agreeable to have port placement on February 15 with my partner Dr. Perrin Maltese.

## 2015-03-25 ENCOUNTER — Encounter (HOSPITAL_COMMUNITY): Payer: Self-pay

## 2015-03-25 ENCOUNTER — Ambulatory Visit: Payer: Self-pay | Admitting: Neurology

## 2015-03-30 ENCOUNTER — Encounter: Payer: Self-pay | Admitting: Dietician

## 2015-03-30 ENCOUNTER — Encounter (HOSPITAL_BASED_OUTPATIENT_CLINIC_OR_DEPARTMENT_OTHER): Payer: Medicaid Other

## 2015-03-30 ENCOUNTER — Encounter (HOSPITAL_BASED_OUTPATIENT_CLINIC_OR_DEPARTMENT_OTHER): Payer: Medicaid Other | Admitting: Oncology

## 2015-03-30 ENCOUNTER — Encounter (HOSPITAL_COMMUNITY): Payer: Self-pay | Admitting: Oncology

## 2015-03-30 ENCOUNTER — Ambulatory Visit (HOSPITAL_COMMUNITY): Payer: Self-pay

## 2015-03-30 VITALS — BP 151/81 | HR 93 | Temp 98.4°F | Resp 18 | Wt 229.0 lb

## 2015-03-30 DIAGNOSIS — Z79899 Other long term (current) drug therapy: Secondary | ICD-10-CM | POA: Diagnosis not present

## 2015-03-30 DIAGNOSIS — C184 Malignant neoplasm of transverse colon: Secondary | ICD-10-CM | POA: Diagnosis present

## 2015-03-30 DIAGNOSIS — E785 Hyperlipidemia, unspecified: Secondary | ICD-10-CM | POA: Diagnosis not present

## 2015-03-30 DIAGNOSIS — I2510 Atherosclerotic heart disease of native coronary artery without angina pectoris: Secondary | ICD-10-CM | POA: Diagnosis not present

## 2015-03-30 DIAGNOSIS — Z5111 Encounter for antineoplastic chemotherapy: Secondary | ICD-10-CM

## 2015-03-30 DIAGNOSIS — F4310 Post-traumatic stress disorder, unspecified: Secondary | ICD-10-CM | POA: Diagnosis not present

## 2015-03-30 DIAGNOSIS — R600 Localized edema: Secondary | ICD-10-CM | POA: Diagnosis not present

## 2015-03-30 DIAGNOSIS — Z86010 Personal history of colonic polyps: Secondary | ICD-10-CM | POA: Diagnosis not present

## 2015-03-30 DIAGNOSIS — K219 Gastro-esophageal reflux disease without esophagitis: Secondary | ICD-10-CM | POA: Diagnosis not present

## 2015-03-30 DIAGNOSIS — I10 Essential (primary) hypertension: Secondary | ICD-10-CM | POA: Diagnosis not present

## 2015-03-30 DIAGNOSIS — Z9889 Other specified postprocedural states: Secondary | ICD-10-CM | POA: Diagnosis not present

## 2015-03-30 DIAGNOSIS — F329 Major depressive disorder, single episode, unspecified: Secondary | ICD-10-CM | POA: Diagnosis not present

## 2015-03-30 DIAGNOSIS — Z85038 Personal history of other malignant neoplasm of large intestine: Secondary | ICD-10-CM | POA: Diagnosis not present

## 2015-03-30 DIAGNOSIS — E7800 Pure hypercholesterolemia, unspecified: Secondary | ICD-10-CM | POA: Diagnosis not present

## 2015-03-30 LAB — CBC WITH DIFFERENTIAL/PLATELET
BASOS ABS: 0.1 10*3/uL (ref 0.0–0.1)
BASOS PCT: 1 %
Eosinophils Absolute: 0.1 10*3/uL (ref 0.0–0.7)
Eosinophils Relative: 2 %
HEMATOCRIT: 40.7 % (ref 39.0–52.0)
HEMOGLOBIN: 13.5 g/dL (ref 13.0–17.0)
Lymphocytes Relative: 20 %
Lymphs Abs: 1.2 10*3/uL (ref 0.7–4.0)
MCH: 31.2 pg (ref 26.0–34.0)
MCHC: 33.2 g/dL (ref 30.0–36.0)
MCV: 94 fL (ref 78.0–100.0)
Monocytes Absolute: 0.7 10*3/uL (ref 0.1–1.0)
Monocytes Relative: 11 %
NEUTROS ABS: 4.1 10*3/uL (ref 1.7–7.7)
NEUTROS PCT: 66 %
Platelets: 278 10*3/uL (ref 150–400)
RBC: 4.33 MIL/uL (ref 4.22–5.81)
RDW: 14 % (ref 11.5–15.5)
WBC: 6.1 10*3/uL (ref 4.0–10.5)

## 2015-03-30 LAB — COMPREHENSIVE METABOLIC PANEL
ALBUMIN: 3.5 g/dL (ref 3.5–5.0)
ALT: 13 U/L — AB (ref 17–63)
AST: 21 U/L (ref 15–41)
Alkaline Phosphatase: 46 U/L (ref 38–126)
Anion gap: 6 (ref 5–15)
BUN: 8 mg/dL (ref 6–20)
CALCIUM: 8.8 mg/dL — AB (ref 8.9–10.3)
CHLORIDE: 104 mmol/L (ref 101–111)
CO2: 30 mmol/L (ref 22–32)
Creatinine, Ser: 0.69 mg/dL (ref 0.61–1.24)
GFR calc Af Amer: >60 mL/min (ref >60)
GLUCOSE: 95 mg/dL (ref 65–99)
POTASSIUM: 4.2 mmol/L (ref 3.5–5.1)
SODIUM: 140 mmol/L (ref 135–145)
TOTAL PROTEIN: 6.9 g/dL (ref 6.5–8.1)
Total Bilirubin: 0.4 mg/dL (ref 0.3–1.2)

## 2015-03-30 MED ORDER — PALONOSETRON HCL INJECTION 0.25 MG/5ML
0.2500 mg | Freq: Once | INTRAVENOUS | Status: AC
  Administered 2015-03-30: 0.25 mg via INTRAVENOUS

## 2015-03-30 MED ORDER — DEXTROSE 5 % IV SOLN
85.0000 mg/m2 | Freq: Once | INTRAVENOUS | Status: AC
  Administered 2015-03-30: 195 mg via INTRAVENOUS
  Filled 2015-03-30: qty 39

## 2015-03-30 MED ORDER — SODIUM CHLORIDE 0.9% FLUSH: 10.0000 mL | INTRAVENOUS | Status: DC | PRN

## 2015-03-30 MED ORDER — SODIUM CHLORIDE 0.9 % IV SOLN
10.0000 mg | Freq: Once | INTRAVENOUS | Status: AC
  Administered 2015-03-30: 10 mg via INTRAVENOUS
  Filled 2015-03-30: qty 1

## 2015-03-30 MED ORDER — FLUOROURACIL CHEMO INJECTION 2.5 GM/50ML
400.0000 mg/m2 | Freq: Once | INTRAVENOUS | Status: AC
Start: 2015-03-30 — End: 2015-03-30
  Administered 2015-03-30: 900 mg via INTRAVENOUS
  Filled 2015-03-30: qty 18

## 2015-03-30 MED ORDER — DEXTROSE 5 % IV SOLN
Freq: Once | INTRAVENOUS | Status: AC
  Administered 2015-03-30: 11:00:00 via INTRAVENOUS

## 2015-03-30 MED ORDER — LEUCOVORIN CALCIUM INJECTION 350 MG
400.0000 mg/m2 | Freq: Once | INTRAMUSCULAR | Status: AC
  Administered 2015-03-30: 912 mg via INTRAVENOUS
  Filled 2015-03-30: qty 45.6

## 2015-03-30 MED ORDER — FLUOROURACIL CHEMO INJECTION 5 GM/100ML
2400.0000 mg/m2 | INTRAVENOUS | Status: DC
  Administered 2015-03-30: 5450 mg via INTRAVENOUS
  Filled 2015-03-30: qty 109

## 2015-03-30 MED ORDER — PALONOSETRON HCL INJECTION 0.25 MG/5ML
INTRAVENOUS | Status: AC
  Filled 2015-03-30: qty 5

## 2015-03-30 MED ORDER — CYANOCOBALAMIN 1000 MCG/ML IJ SOLN
1000.0000 ug | INTRAMUSCULAR | Status: AC
  Administered 2015-03-30: 1000 ug via INTRAMUSCULAR
  Filled 2015-03-30: qty 1

## 2015-03-30 NOTE — Progress Notes (Signed)
Erik Dryer, PA-C Erik [name] [id number]  Adenocarcinoma of transverse colon Erik [name]) - Plan: CBC with Differential, Comprehensive metabolic panel  CURRENT THERAPY: FOLFOX beginning on 03/30/2015  INTERVAL HISTORY: Erik [name] 60 y.o. male returns for followup of Stage IIIC Erik [name]) adenocarcinoma of transverse colon.    Adenocarcinoma of transverse colon (Erik [name])   01/12/2015 Pathologic Stage Colon, biopsy, distal transverse - TUBULOVILLOUS ADENOMA WITH HIGH GRADE DYSPLASIA.   01/12/2015 Procedure Colonoscopy by Dr. Oneida [name].   01/12/2015 Tumor Marker CEA: 6.6 (H)    01/18/2015 Imaging CT abd/pelvis- Apple-core lesion identified in the mid transverse colon without obstruction. No evidence for lymphadenopathy in the gastrohepatic ligament or omentum.  Stable 8 mm hypo attenuating lesion in the left liver, likely a cyst.   02/10/2015 Initial Diagnosis Adenocarcinoma of transverse colon ([place])   02/12/2015 Definitive Surgery Erik [name], Extended right hemicolectomy    02/12/2015 Pathology Results Mucinous adenocarcinoma with penetration of visceral peritoneum, 4/19 lymph nodes for metasattic disease, negative resection margins, with LVI and perineural invasion   03/30/2015 -  Chemotherapy FOLFOX    I personally reviewed and went over laboratory results with the patient.  The results are noted within this dictation.  Labs are being updated today.  He is here today to start systemic chemotherapy in the adjuvant setting with FOLFOX. I reviewed the risks, benefits, alternatives, and side effects of therapy including nausea, vomiting, diarrhea, peripheral neuropathy, cold intolerance, stomatitis, fatigue. He knows to call us with any problems or issues.  He denies any complaints today.  Past Medical History  Diagnosis Date  . Head trauma 2001    closed head injury; coma for 4 weeks  . Depression   . Abnormal stress echocardiogram   . Hypercholesterolemia   .  Hypertension   . Alcohol abuse     Heavy Use up until 2010  . GERD (gastroesophageal reflux disease)   . PTSD (post-traumatic stress disorder)   . Anxiety   . Adenocarcinoma of transverse colon ([place]) 02/10/2015    has HYPERCHOLESTEROLEMIA  IIA; Hyperlipidemia; ALCOHOL ABUSE; TOBACCO USER; DEPRESSION; CAD, NATIVE VESSEL; ING HERN W/GANGREN RECUR UNILAT/UNSPEC ING HERN; ABNORMAL STRESS ELECTROCARDIOGRAM; ABNORMAL CV (STRESS) TEST; HEAD TRAUMA; Bilateral lower extremity edema; SAH (subarachnoid hemorrhage) ([place]); SDH (subdural hematoma) ([place]); Orthostatic syncope; Bronchitis; Episodic tension-type headache, not intractable; Syncope and collapse; History of alcohol abuse; GERD (gastroesophageal reflux disease); History of colonic polyps; Tubular adenoma of colon; Adenocarcinoma of transverse colon ([place]); and Malignant neoplasm of sigmoid colon ([place]) on his problem list.     has No Known Allergies.  Current Outpatient Prescriptions on File Prior to Visit  Medication Sig Dispense Refill  . albuterol (PROVENTIL HFA;VENTOLIN HFA) 108 (90 BASE) MCG/ACT inhaler Inhale 2 puffs into the lungs every 6 (six) hours as needed for wheezing or shortness of breath. 1 Inhaler 1  . amitriptyline (ELAVIL) 25 MG tablet TAKE ONE TABLET BY MOUTH AT BEDTIME. (Patient taking differently: TAKE ONE TABLET BY MOUTH AT BEDTIME. prn) 30 tablet 2  . atorvastatin (LIPITOR) 20 MG tablet Take 1 tablet (20 mg total) by mouth daily. 90 tablet 1  . dexlansoprazole (DEXILANT) 60 MG capsule Take 1 capsule (60 mg total) by mouth daily. 90 capsule 1  . fluorouracil CALGB 91478 in sodium chloride 0.9 % 150 mL Inject into the vein. Reported on 03/22/2015    . FLUoxetine (PROZAC) 20 MG capsule Take 20 mg by mouth 2 (two) times daily.     Marland Kitchen  HYDROcodone-acetaminophen (NORCO) 5-325 MG tablet Take 1 tablet by mouth every 6 (six) hours as needed for moderate pain. 20 tablet 0  . leucovorin in dextrose 5 % 250 mL Inject into the vein every 14  (fourteen) days. Reported on 03/22/2015    . lidocaine-prilocaine (EMLA) cream Apply a quarter size amount to port site 1 hour prior to chemo. Do not rub in. Cover with plastic wrap. 30 g 3  . OXALIPLATIN IV Inject into the vein every 14 (fourteen) days. Reported on 03/22/2015    . rosuvastatin (CRESTOR) 40 MG tablet Take 1 tablet (40 mg total) by mouth daily. 90 tablet 1  . sildenafil (VIAGRA) 100 MG tablet Take 0.5-1 tablets (50-100 mg total) by mouth daily as needed for erectile dysfunction. 10 tablet 1   No current facility-administered medications on file prior to visit.    Past Surgical History  Procedure Laterality Date  . Kidney surgery      >30 years ago  . Cardiac cath    . Craniotomy  2001  . Head injury surgery    . Colonoscopy  2011    Dr. Oneida [name]: multiple adenomas and hyperplastic polyps  . Colonoscopy with propofol N/A 01/12/2015    Procedure: COLONOSCOPY WITH PROPOFOL;  Surgeon: Erik Binder, MD;  [facility];  Service: Endoscopy;  Laterality: N/A;  1030  . Esophagogastroduodenoscopy (egd) with propofol N/A 01/12/2015    Procedure: ESOPHAGOGASTRODUODENOSCOPY (EGD) WITH PROPOFOL;  Surgeon: Erik Binder, MD;  Location: [facility];  Service: Endoscopy;  Laterality: N/A;  . Hernia repair Right 2012    Inguinal- [place] Na  . Laparoscopic right hemi colectomy Left 02/10/2015    Procedure: LAPAROSCOPIC THEN OPEN RIGHT HEMI COLECTOMY;  Surgeon: Erik Pert, MD;  Location: [facility];  Service: General;  Laterality: Left;  . Portacath placement N/A 03/24/2015    Procedure: INSERTION PORT-A-CATH;  Surgeon: Erik Husbands, MD;  Location: [facility] ORS;  Service: General;  Laterality: N/A;    Denies any headaches, dizziness, double vision, fevers, chills, night sweats, nausea, vomiting, diarrhea, constipation, chest pain, heart palpitations, shortness of breath, blood in stool, black tarry stool, urinary pain, urinary burning, urinary frequency, hematuria.   PHYSICAL  EXAMINATION  ECOG PERFORMANCE STATUS: 0 - Asymptomatic  Filed Vitals:   03/30/15 0929  BP: 151/81  Pulse: 93  Temp: 98.4 F (36.9 C)  Resp: 18    GENERAL:alert, no distress, well nourished, well developed, comfortable, cooperative and smiling SKIN: skin color, texture, turgor are normal, no rashes or significant lesions HEAD: Normocephalic, No masses, lesions, tenderness or abnormalities EYES: normal, EOMI, Conjunctiva are pink and non-injected EARS: External ears normal OROPHARYNX:lips, buccal mucosa, and tongue normal and mucous membranes are moist  NECK: supple, trachea midline LYMPH:  not examined BREAST:not examined LUNGS: clear to auscultation  HEART: regular rate & rhythm ABDOMEN:abdomen soft and normal bowel sounds BACK: Back symmetric, no curvature. EXTREMITIES:less then 2 second capillary refill, no joint deformities, effusion, or inflammation, no cyanosis  NEURO: alert & oriented x 3 with fluent speech, no focal motor/sensory deficits, gait normal   LABORATORY DATA: CBC    Component Value Date/Time   WBC 6.1 03/30/2015 0940   RBC 4.33 03/30/2015 0940   HGB 13.5 03/30/2015 0940   HCT 40.7 03/30/2015 0940   PLT 278 03/30/2015 0940   MCV 94.0 03/30/2015 0940   MCH 31.2 03/30/2015 0940   MCHC 33.2 03/30/2015 0940   RDW 14.0 03/30/2015 0940   LYMPHSABS 1.2 03/30/2015 0940  MONOABS 0.7 03/30/2015 0940   EOSABS 0.1 03/30/2015 0940   BASOSABS 0.1 03/30/2015 0940      Chemistry      Component Value Date/Time   NA 136 03/09/2015 1300   K 3.7 03/09/2015 1300   CL 98* 03/09/2015 1300   CO2 27 03/09/2015 1300   BUN 8 03/09/2015 1300   CREATININE 0.92 03/09/2015 1300   CREATININE 0.86 12/16/2014 0844      Component Value Date/Time   CALCIUM 9.1 03/09/2015 1300   ALKPHOS 66 03/09/2015 1300   AST 17 03/09/2015 1300   ALT 9* 03/09/2015 1300   BILITOT 0.6 03/09/2015 1300        PENDING LABS:   RADIOGRAPHIC STUDIES:  Dg Chest 1 View  03/24/2015   CLINICAL DATA:  Port-A-Cath placement, history of sigmoid colon carcinoma EXAM: CHEST 1 VIEW COMPARISON:  Portable chest x-ray 02/13/2015 and CT chest of 03/15/2015 FINDINGS: A right-sided Port-A-Cath is present. The tip is seen to the lower SVC. No pneumothorax is noted. The lungs are clear. Heart size is stable. IMPRESSION: Right-sided Port-A-Cath tip at low SVC.  No pneumothorax Electronically Signed   By: Ivar Drape M.D.   On: 03/24/2015 13:58   Ct Chest W Contrast  03/15/2015  CLINICAL DATA:  Transverse colon adenocarcinoma status post right hemicolectomy on 02/10/2015, presenting for chest staging. EXAM: CT CHEST WITH CONTRAST TECHNIQUE: Multidetector CT imaging of the chest was performed during intravenous contrast administration. CONTRAST:  30mL OMNIPAQUE IOHEXOL 300 MG/ML  SOLN COMPARISON:  02/17/2010 chest CT.  02/13/2015 chest radiograph. FINDINGS: Mediastinum/Nodes: Normal heart size. No pericardial fluid/thickening. Left anterior descending coronary atherosclerosis. Great vessels are normal in course and caliber. No central pulmonary emboli. Normal visualized thyroid. Normal esophagus. No pathologically enlarged axillary, mediastinal or hilar lymph nodes. Lungs/Pleura: No pneumothorax. No pleural effusion. Mild centrilobular emphysema and diffuse bronchial wall thickening. There is a subsolid 1.1 x 1.1 cm left upper lobe pulmonary nodule (series 3/ image 30), new since 02/17/2010. There is patchy centrilobular nodularity throughout both lungs, upper lobe predominant, not appreciably changed since 02/17/2010. No acute consolidative airspace disease or additional significant pulmonary nodules. Upper abdomen: Unremarkable. Musculoskeletal: No aggressive appearing focal osseous lesions. Mild degenerative changes are seen in the thoracic spine. IMPRESSION: 1. Subsolid 1.1 cm left upper lobe pulmonary nodule, new since 02/17/2010, indeterminate (favor either a focal irregular scar or primary lung neoplasm,  unlikely to represent a metastasis). Initial follow-up by chest CT without contrast is recommended in 3 months to confirm persistence. This recommendation follows the consensus statement: Recommendations for the Management of Subsolid Pulmonary Nodules Detected at CT: A Statement from the [place] as published in Radiology 2013; 266:304-317. 2. Patchy upper lobe predominant centrilobular nodularity throughout both lungs, not appreciably changed since 02/17/2010, likely representing smoking related interstitial lung disease (respiratory bronchiolitis if the patient is asymptomatic or respiratory bronchiolitis - interstitial lung disease (RB-ILD) if the patient is symptomatic). 3. Mild centrilobular emphysema and diffuse bronchial wall thickening, suggesting COPD. 4. Coronary atherosclerosis. Electronically Signed   By: Ilona Sorrel M.D.   On: 03/15/2015 09:38   Dg C-arm 1-60 Min-no Report  03/24/2015  CLINICAL DATA: surgery, right chest port insertion C-ARM 1-60 MINUTES Fluoroscopy was utilized by the requesting physician.  No radiographic interpretation.     PATHOLOGY:    ASSESSMENT AND PLAN:  Adenocarcinoma of transverse colon ([place]) Stage IIIC [id number]) adenocarcinoma of transverse colon, S/P definitive surgery with a right extended hemicolectomy by Dr. Clayburn [name].  Now on  systemic adjuvant therapy consisting of FOLFOX.  Oncology history is updated.  I reviewed the risks, benefits, alternatives, and side effects of FOLFOX chemotherapy. I reviewed the more general side effects can most common side effects. I reviewed side effects including cold intolerance, peripheral neuropathy, nausea, vomiting, fatigue, diarrhea, stomatitis. He knows to call us with any issues or problems.  Pre-treatment labs today as ordered.  Return in 1 week with labs: CBC diff, CMET  Return in 1 week for follow-up for a nadir check.    THERAPY PLAN:  Adjuvant systemic chemotherapy with FOLFOX  beginning on 03/30/2015.  All questions were answered. The patient knows to call the clinic with any problems, questions or concerns. We can certainly see the patient much sooner if necessary.  Patient and plan discussed with Dr. Ancil Linsey and she is in agreement with the aforementioned.   This note is electronically signed by: Robynn Pane, PA-C 03/30/2015 10:00 AM

## 2015-03-30 NOTE — Progress Notes (Signed)
Following up with patient as requested by oncology nursing staff.   Contacted Pt by Visiting during chemotherapy   Wt Readings from Last 10 Encounters:  03/30/15 229 lb (103.874 kg)  03/22/15 219 lb 9.6 oz (99.61 kg)  03/18/15 221 lb (100.245 kg)  03/09/15 218 lb (98.884 kg)  03/03/15 223 lb (101.152 kg)  02/10/15 230 lb (104.327 kg)  02/04/15 232 lb (105.235 kg)  01/21/15 232 lb 6.4 oz (105.416 kg)  01/06/15 223 lb (101.152 kg)  12/16/14 226 lb (102.513 kg)   Patient weight has increased by ~10 lbs since RD first spoke with pt ~ 3 weeks ago.   Pt was surprised that he had gained as much weight as the scale indicated.   Patient reports oral intake as pretty good. He still has early satiety and continues to eat small meals throughout the day to deal with this.   No N/V/C  During last conversation, patient had mentioned that diarrhea was his number one issue. He reports this has improved, but he still has 2-3 loose BMs each day. RD emphasized importance of hydration. Explained that some beverages are better at hydrating than others. Recommend consuming water, low sugar sports drinks, or ORS to best hydrate himself and to avoid drinking beverages with high amounts of sugar/caffeine.   Pt says that he has since followed up with his surgeon and he is no longer on any dietary restrictions. He was advised by surgeon to "eat what you can tolerate".    He occasionally drinks Ensure/Boost, but doesn't sound like anything habitual.   Pt is undergoing first chemo session today. RD to follow from periphery and will f/u as needed.   Burtis Junes RD, LDN Nutrition Pager: [phone] 03/30/2015 11:40 AM

## 2015-03-30 NOTE — Patient Instructions (Signed)
[place] at [facility] Discharge Instructions  RECOMMENDATIONS MADE BY THE CONSULTANT AND ANY TEST RESULTS WILL BE SENT TO YOUR REFERRING PHYSICIAN.  Exam and discussion today with [name], PA. Return as scheduled in 1 week for lab work and office visit. Chemotherapy and office visit with Dr. Whitney Muse in 2 weeks as scheduled.   Thank you for choosing [place] at [facility] to provide your oncology and hematology care.  To afford each patient quality time with our provider, please arrive at least 15 minutes before your scheduled appointment time.   Beginning January 23rd 2017 lab work for the [facility] will be done in the  Main lab at [facility] on 1st floor. If you have a lab appointment with the [facility] please come in thru the  Main Entrance and check in at the main information desk  You need to re-schedule your appointment should you arrive 10 or more minutes late.  We strive to give you quality time with our providers, and arriving late affects you and other patients whose appointments are after yours.  Also, if you no show three or more times for appointments you may be dismissed from the clinic at the providers discretion.     Again, thank you for choosing [facility].  Our hope is that these requests will decrease the amount of time that you wait before being seen by our physicians.       _____________________________________________________________  Should you have questions after your visit to [facility], please contact our office at [phone] between the hours of 8:30 a.m. and 4:30 p.m.  Voicemails left after 4:30 p.m. will not be returned until the following business day.  For prescription refill requests, have your pharmacy contact our office.

## 2015-03-30 NOTE — Patient Instructions (Signed)
[facility] Discharge Instructions for Patients Receiving Chemotherapy   Beginning January 23rd 2017 lab work for the [facility] will be done in the  Main lab at [facility] on 1st floor. If you have a lab appointment with the [place] please come in thru the  Main Entrance and check in at the main information desk   Today you received the following chemotherapy agents: Oxaliplatin, Leucovorin, and fluorouracil.   Please call us with any concerns or questions.     If you develop nausea and vomiting, or diarrhea that is not controlled by your medication, call the clinic.  The clinic phone number is [phone]. Office hours are Monday-Friday 8:30am-5:00pm.  BELOW ARE SYMPTOMS THAT SHOULD BE REPORTED IMMEDIATELY:  *FEVER GREATER THAN 101.0 F  *CHILLS WITH OR WITHOUT FEVER  NAUSEA AND VOMITING THAT IS NOT CONTROLLED WITH YOUR NAUSEA MEDICATION  *UNUSUAL SHORTNESS OF BREATH  *UNUSUAL BRUISING OR BLEEDING  TENDERNESS IN MOUTH AND THROAT WITH OR WITHOUT PRESENCE OF ULCERS  *URINARY PROBLEMS  *BOWEL PROBLEMS  UNUSUAL RASH Items with * indicate a potential emergency and should be followed up as soon as possible. If you have an emergency after office hours please contact your primary care physician or go to the nearest emergency department.  Please call the clinic during office hours if you have any questions or concerns.   You may also contact the Patient Navigator at [phone] should you have any questions or need assistance in obtaining follow up care.

## 2015-03-30 NOTE — Progress Notes (Signed)
Patient tolerated infusion well.  VSS throughout.  Patient was educated on the home infusion pump, consent signed, and the patient was given the spill kit as well as instruction on how to use it in the case of a spill.  He verbalized understanding.

## 2015-03-30 NOTE — Assessment & Plan Note (Addendum)
Stage IIIC [facility]) adenocarcinoma of transverse colon, S/P definitive surgery with a right extended hemicolectomy by Dr. [name].  Now on systemic adjuvant therapy consisting of FOLFOX.  Oncology history is updated.  I reviewed the risks, benefits, alternatives, and side effects of FOLFOX chemotherapy. I reviewed the more general side effects can most common side effects. I reviewed side effects including cold intolerance, peripheral neuropathy, nausea, vomiting, fatigue, diarrhea, stomatitis. He knows to call us with any issues or problems.  Pre-treatment labs today as ordered.  Return in 1 week with labs: CBC diff, CMET  Return in 1 week for follow-up for a nadir check.

## 2015-03-31 ENCOUNTER — Telehealth (HOSPITAL_COMMUNITY): Payer: Self-pay | Admitting: *Deleted

## 2015-03-31 DIAGNOSIS — Z09 Encounter for follow-up examination after completed treatment for conditions other than malignant neoplasm: Secondary | ICD-10-CM

## 2015-03-31 NOTE — Telephone Encounter (Signed)
24h follow up: patient got up this morning and got tea out of the refrigerator - his lips, mouth, and back of throat tingled - he knows to wait a few days until drinking something cold again. He feels a little tired today. Pt had question about his appt on 2/28. I explained to him that he needs to have labs drawn on 2/28 @ 8:50 on 1st floor and then come up to [place] @ 9 to see Tom. Pt instructed to call me with any questions.

## 2015-04-01 ENCOUNTER — Encounter (HOSPITAL_BASED_OUTPATIENT_CLINIC_OR_DEPARTMENT_OTHER): Payer: Medicaid Other

## 2015-04-01 VITALS — BP 142/62 | HR 76 | Temp 98.4°F | Resp 20

## 2015-04-01 DIAGNOSIS — C184 Malignant neoplasm of transverse colon: Secondary | ICD-10-CM

## 2015-04-01 DIAGNOSIS — Z452 Encounter for adjustment and management of vascular access device: Secondary | ICD-10-CM

## 2015-04-01 MED ORDER — SODIUM CHLORIDE 0.9% FLUSH
10.0000 mL | INTRAVENOUS | Status: DC | PRN
  Administered 2015-04-01: 10 mL
  Filled 2015-04-01: qty 10

## 2015-04-01 MED ORDER — HEPARIN SOD (PORK) LOCK FLUSH 100 UNIT/ML IV SOLN
500.0000 [IU] | Freq: Once | INTRAVENOUS | Status: AC | PRN
  Administered 2015-04-01: 500 [IU]

## 2015-04-01 MED ORDER — HEPARIN SOD (PORK) LOCK FLUSH 100 UNIT/ML IV SOLN
INTRAVENOUS | Status: AC
  Filled 2015-04-01: qty 5

## 2015-04-01 NOTE — Congregational Nurse Program (Signed)
Congregational Nurse Program Note  Date of Encounter: 03/31/2015  Past Medical History: Past Medical History  Diagnosis Date  . Head trauma 2001    closed head injury; coma for 4 weeks  . Depression   . Abnormal stress echocardiogram   . Hypercholesterolemia   . Hypertension   . Alcohol abuse     Heavy Use up until 2010  . GERD (gastroesophageal reflux disease)   . PTSD (post-traumatic stress disorder)   . Anxiety   . Adenocarcinoma of transverse colon ([place]) 02/10/2015    Encounter Details:     CNP Questionnaire - 03/31/15 2029    Patient Demographics   Is this a new or existing patient? Existing   Patient is considered a/an Not Applicable   Race Caucasian/White   Patient Assistance   Location of Patient Assistance [employer]   Patient's financial/insurance status [facility] Income   Interventions Follow-up/Education/Support provided after completed appt.   Patient referred to apply for the following financial assistance Not Applicable   Food insecurities addressed Not Applicable   Transportation assistance Yes   Type of Assistance Volunteer   Assistance securing medications No   Educational health offerings Cancer;Chronic disease   Encounter Details   Primary purpose of visit Chronic Illness/Condition Visit   Was an Emergency Department visit averted? Not Applicable   Does patient have a medical provider? Yes   Patient referred to Not Applicable   Was a mental health screening completed? (GAINS tool) No   Does patient have dental issues? No   Does patient have vision issues? No   Since previous encounter, have you referred patient for abnormal blood pressure that resulted in a new diagnosis or medication change? No   Since previous encounter, have you referred patient for abnormal blood glucose that resulted in a new diagnosis or medication change? No      Notified client that volunteer transportation from [employer] had  been arranged for his follow up appointment post Portacath placement. Appointment is for Friday February 24th, 2017 at 4pm. Name of volunteer given and time for pickup. Also follow up with client since he becane his chemothearpy treatment Feb 21st ,2017 and is now home with pump and infusion until Feb 23d 2017. Client states he is doing well, but is nervous about the pump. Encouraged client to call his navigator or the cancer center at [facility] for any problems or concerns. Client is receiving transportation assistance for Chemotherapy treatments through [place]. [employer] and also some financial assistance with cancer related medications. Will continue to follow client for support and any other needs as needed. Client has also been referred to [facility] intern for case management .

## 2015-04-02 ENCOUNTER — Encounter: Payer: Self-pay | Admitting: Surgery

## 2015-04-02 ENCOUNTER — Ambulatory Visit (INDEPENDENT_AMBULATORY_CARE_PROVIDER_SITE_OTHER): Payer: Self-pay | Admitting: Surgery

## 2015-04-02 VITALS — BP 114/68 | HR 94 | Temp 98.6°F | Wt 230.2 lb

## 2015-04-02 DIAGNOSIS — C184 Malignant neoplasm of transverse colon: Secondary | ICD-10-CM

## 2015-04-02 NOTE — Progress Notes (Signed)
This patient status post port placement by Dr. Perrin Maltese as well as transverse colon resection for adenocarcinoma by Dr. Adonis Huguenin Has no problems and is using the port has had infusion once already.  Healthy male patient walks with a cane port site is healing well no erythema or drainage Abdominal exam demonstrates well-healed wound with no erythema or drainage and nontender abdomen  Patient doing very well recommend follow up on an as-needed basis

## 2015-04-02 NOTE — Progress Notes (Signed)
Patient reports he had been drinking cold liquids right out of refrigerator yesterday and when he talked with Hildred Alamin she reviewed with him the side effects of Oxaliplatin and encouraged him not to drink cold liquids but only consume room temperature fluids. Reports he has not tried that again since speaking with [facility]. Denies any other complaints associated with chemo. Infusion pump complete. Disconnected infusion pump from patient. Flushed port per protocol and removed port needle. Patient ambulatory on discharge home to self.

## 2015-04-02 NOTE — Patient Instructions (Signed)
[place] at [facility] Discharge Instructions  RECOMMENDATIONS MADE BY THE CONSULTANT AND ANY TEST RESULTS WILL BE SENT TO YOUR REFERRING PHYSICIAN.  Discontinue continuous infusion pump. Return as scheduled.  Thank you for choosing [place] at [facility] to provide your oncology and hematology care.  To afford each patient quality time with our provider, please arrive at least 15 minutes before your scheduled appointment time.   Beginning January 23rd 2017 lab work for the [facility] will be done in the  Main lab at [facility] on 1st floor. If you have a lab appointment with the [place] please come in thru the  Main Entrance and check in at the main information desk  You need to re-schedule your appointment should you arrive 10 or more minutes late.  We strive to give you quality time with our providers, and arriving late affects you and other patients whose appointments are after yours.  Also, if you no show three or more times for appointments you may be dismissed from the clinic at the providers discretion.     Again, thank you for choosing [facility].  Our hope is that these requests will decrease the amount of time that you wait before being seen by our physicians.       _____________________________________________________________  Should you have questions after your visit to [facility], please contact our office at [phone] between the hours of 8:30 a.m. and 4:30 p.m.  Voicemails left after 4:30 p.m. will not be returned until the following business day.  For prescription refill requests, have your pharmacy contact our office.

## 2015-04-05 ENCOUNTER — Telehealth (HOSPITAL_COMMUNITY): Payer: Self-pay | Admitting: *Deleted

## 2015-04-05 NOTE — Assessment & Plan Note (Addendum)
Stage IIIC [facility]) adenocarcinoma of transverse colon, S/P definitive surgery with a right extended hemicolectomy by Dr. [name].  Now on systemic adjuvant therapy consisting of FOLFOX.  Oncology history is up to day.  Nadir check labs today: CBC diff, CMET.  Laboratory work today is excellent. No evidence of any cytopenias or bone marrow intolerance.  He tolerated his first cycle of chemotherapy without any major complaints. He notes a 24 hour history of fatigue the day following chemotherapy and minor cold intolerance that is resolving as expected; otherwise no other complaints associated with chemotherapy.  I've recommended over-the-counter pain medication for his right shoulder/chest pain associated with his Port-A-Cath placement. I've also recommended other over-the-counter options regarding sleeping aids including diphenhydramine, acetaminophen plus diphenhydramine, and melatonin. He does have amitriptyline at home which is very effective for his insomnia, however, most mornings he has groggy following taking this medication. The aforementioned other options should not cause this issue.  Return in 1 week with pre-treatment labs: CBC diff, CMET  Return in 1 week for follow-up and cycle 2 of FOLFOX.

## 2015-04-05 NOTE — Telephone Encounter (Signed)
Patient states he is not doing good post 1st chemo. Sunday morning he didn't feel like getting out of bed. He states he isn't nauseated or vomiting. He hasn't had to take any nausea medications.  But he feels really weak and tired. He states that everything hurts on his body. Patient states that his appetite is ok (same as before - after a few bites he is full). He is eating small amounts several times a day. Pt states he is drinking a lot of water, gatorade, and tea. Fingers tingle when he goes into the refrigerator. The burning/tingling sensation in his throat when he drinks something cool comes and goes. No diarrhea or constipation. He states his bowels are pretty regular. Stools are loose (this is how they were pre chemo). Pt instructed to call me with any questions or concerns.

## 2015-04-05 NOTE — Progress Notes (Signed)
Erik Dryer, PA-C Erik [name] [address]  Adenocarcinoma of transverse colon [facility])  CURRENT THERAPY: FOLFOX beginning on 03/30/2015  INTERVAL HISTORY: Erik [name] 60 y.o. male returns for followup of Stage IIIC [facility]) adenocarcinoma of transverse colon.    Adenocarcinoma of transverse colon (Erik [name])   01/12/2015 Pathologic Stage Colon, biopsy, distal transverse - TUBULOVILLOUS ADENOMA WITH HIGH GRADE DYSPLASIA.   01/12/2015 Procedure Colonoscopy by Dr. Oneida [name].   01/12/2015 Tumor Marker CEA: 6.6 (H)    01/18/2015 Imaging CT abd/pelvis- Apple-core lesion identified in the mid transverse colon without obstruction. No evidence for lymphadenopathy in the gastrohepatic ligament or omentum.  Stable 8 mm hypo attenuating lesion in the left liver, likely a cyst.   02/10/2015 Initial Diagnosis Adenocarcinoma of transverse colon (Erik [name])   02/12/2015 Definitive Surgery Erik [name], Extended right hemicolectomy    02/12/2015 Pathology Results Mucinous adenocarcinoma with penetration of visceral peritoneum, 4/19 lymph nodes for metasattic disease, negative resection margins, with LVI and perineural invasion   03/30/2015 -  Chemotherapy FOLFOX    I personally reviewed and went over laboratory results with the patient.  The results are noted within this dictation.  Labs are being updated today.Labs today are very stable.  TODAY IS A NADIR CHECK.  He tolerated his first cycle of FOLFOX chemotherapy very well. He notes minimal cold intolerance that has improved over the last few days. He denies any nausea or vomiting. He notes fatigue the day following chemotherapy. He reports that it was not limiting to his activities of daily living, but he does admit that he wanted to rest most of the day.  He has minor right shoulder and chest discomfort secondary to his port placement. He notes that it comes and goes. He reports that it is mostly exacerbated with particular movements.  He has not taken any over-the-counter pain medication for this and I recommended acetaminophen, ibuprofen, and Naprosyn.  He notes some issues with insomnia. He's been prescribed amitriptyline and he notes that is very effective for him except for the fact that the following morning he is groggy. I provided education regarding diphenhydramine, acetaminophen plus diphenhydramine, and melatonin as other alternatives.   Past Medical History  Diagnosis Date  . Head trauma 2001    closed head injury; coma for 4 weeks  . Depression   . Abnormal stress echocardiogram   . Hypercholesterolemia   . Hypertension   . Alcohol abuse     Heavy Use up until 2010  . GERD (gastroesophageal reflux disease)   . PTSD (post-traumatic stress disorder)   . Anxiety   . Adenocarcinoma of transverse colon ([place]) 02/10/2015    has HYPERCHOLESTEROLEMIA  IIA; Hyperlipidemia; ALCOHOL ABUSE; TOBACCO USER; DEPRESSION; CAD, NATIVE VESSEL; ING HERN W/GANGREN RECUR UNILAT/UNSPEC ING HERN; ABNORMAL STRESS ELECTROCARDIOGRAM; ABNORMAL CV (STRESS) TEST; HEAD TRAUMA; Bilateral lower extremity edema; SAH (subarachnoid hemorrhage) ([place]); SDH (subdural hematoma) ([place]); Orthostatic syncope; Bronchitis; Episodic tension-type headache, not intractable; Syncope and collapse; History of alcohol abuse; GERD (gastroesophageal reflux disease); History of colonic polyps; Tubular adenoma of colon; Adenocarcinoma of transverse colon ([place]); and Malignant neoplasm of sigmoid colon ([place]) on his problem list.     has No Known Allergies.  Current Outpatient Prescriptions on File Prior to Visit  Medication Sig Dispense Refill  . albuterol (PROVENTIL HFA;VENTOLIN HFA) 108 (90 BASE) MCG/ACT inhaler Inhale 2 puffs into the lungs every 6 (six) hours as needed for wheezing or shortness of breath. 1 Inhaler 1  .  amitriptyline (ELAVIL) 25 MG tablet TAKE ONE TABLET BY MOUTH AT BEDTIME. (Patient taking differently: TAKE ONE TABLET BY MOUTH AT BEDTIME. prn) 30  tablet 2  . dexlansoprazole (DEXILANT) 60 MG capsule Take 1 capsule (60 mg total) by mouth daily. 90 capsule 1  . fluorouracil CALGB 16109 in sodium chloride 0.9 % 150 mL Inject into the vein. Reported on 03/22/2015    . FLUoxetine (PROZAC) 20 MG capsule Take 20 mg by mouth 2 (two) times daily.     Marland Kitchen leucovorin in dextrose 5 % 250 mL Inject into the vein every 14 (fourteen) days. Reported on 03/22/2015    . lidocaine-prilocaine (EMLA) cream Apply a quarter size amount to port site 1 hour prior to chemo. Do not rub in. Cover with plastic wrap. 30 g 3  . OXALIPLATIN IV Inject into the vein every 14 (fourteen) days. Reported on 03/22/2015    . rosuvastatin (CRESTOR) 40 MG tablet Take 1 tablet (40 mg total) by mouth daily. 90 tablet 1  . sildenafil (VIAGRA) 100 MG tablet Take 0.5-1 tablets (50-100 mg total) by mouth daily as needed for erectile dysfunction. 10 tablet 1  . atorvastatin (LIPITOR) 20 MG tablet Take 1 tablet (20 mg total) by mouth daily. (Patient not taking: Reported on 04/02/2015) 90 tablet 1  . HYDROcodone-acetaminophen (NORCO) 5-325 MG tablet Take 1 tablet by mouth every 6 (six) hours as needed for moderate pain. (Patient not taking: Reported on 04/02/2015) 20 tablet 0   No current facility-administered medications on file prior to visit.    Past Surgical History  Procedure Laterality Date  . Kidney surgery      >30 years ago  . Cardiac cath    . Craniotomy  2001  . Head injury surgery    . Colonoscopy  2011    Dr. Oneida [name]: multiple adenomas and hyperplastic polyps  . Colonoscopy with propofol N/A 01/12/2015    Procedure: COLONOSCOPY WITH PROPOFOL;  Surgeon: Erik Binder, MD;  [facility];  Service: Endoscopy;  Laterality: N/A;  1030  . Esophagogastroduodenoscopy (egd) with propofol N/A 01/12/2015    Procedure: ESOPHAGOGASTRODUODENOSCOPY (EGD) WITH PROPOFOL;  Surgeon: Erik Binder, MD;  Location: [facility];  Service: Endoscopy;  Laterality: N/A;  . Hernia repair  Right 2012    Inguinal- Erik [name]  . Laparoscopic right hemi colectomy Left 02/10/2015    Procedure: LAPAROSCOPIC THEN OPEN RIGHT HEMI COLECTOMY;  Surgeon: Erik Pert, MD;  Location: [facility] ORS;  Service: General;  Laterality: Left;  . Portacath placement N/A 03/24/2015    Procedure: INSERTION PORT-A-CATH;  Surgeon: Jules Husbands, MD;  Location: [facility] ORS;  Service: General;  Laterality: N/A;    Denies any headaches, dizziness, double vision, fevers, chills, night sweats, nausea, vomiting, diarrhea, constipation, chest pain, heart palpitations, shortness of breath, blood in stool, black tarry stool, urinary pain, urinary burning, urinary frequency, hematuria.   PHYSICAL EXAMINATION  ECOG PERFORMANCE STATUS: 0 - Asymptomatic  Filed Vitals:   04/06/15 0910  BP: 116/74  Pulse: 112  Temp: 97.8 F (36.6 C)  Resp: 18    GENERAL:alert, no distress, well nourished, well developed, comfortable, cooperative and smiling, Unaccompanied SKIN: skin color, texture, turgor are normal, no rashes or significant lesions HEAD: Normocephalic, No masses, lesions, tenderness or abnormalities EYES: normal, EOMI, Conjunctiva are pink and non-injected EARS: External ears normal OROPHARYNX:lips, buccal mucosa, and tongue normal and mucous membranes are moist  NECK: supple, trachea midline LYMPH:  not examined BREAST:not examined LUNGS: clear to  auscultation without any wheezes, rales, or rhonchi. HEART: regular rate & rhythm without any murmur, rub, or gallop. ABDOMEN:abdomen soft and normal bowel sounds BACK: Back symmetric, no curvature. EXTREMITIES:less then 2 second capillary refill, no joint deformities, effusion, or inflammation, no cyanosis  NEURO: alert & oriented x 3 with fluent speech, no focal motor/sensory deficits, gait normal   LABORATORY DATA: CBC    Component Value Date/Time   WBC 6.7 04/06/2015 0851   RBC 4.56 04/06/2015 0851   HGB 14.0 04/06/2015 0851   HCT 42.1 04/06/2015 0851     PLT 257 04/06/2015 0851   MCV 92.3 04/06/2015 0851   MCH 30.7 04/06/2015 0851   MCHC 33.3 04/06/2015 0851   RDW 13.5 04/06/2015 0851   LYMPHSABS 1.5 04/06/2015 0851   MONOABS 0.4 04/06/2015 0851   EOSABS 0.2 04/06/2015 0851   BASOSABS 0.0 04/06/2015 0851      Chemistry      Component Value Date/Time   [name] 139 04/06/2015 0851   K 4.0 04/06/2015 0851   CL 102 04/06/2015 0851   CO2 30 04/06/2015 0851   BUN 11 04/06/2015 0851   CREATININE 0.79 04/06/2015 0851   CREATININE 0.86 12/16/2014 0844      Component Value Date/Time   CALCIUM 9.0 04/06/2015 0851   ALKPHOS 47 04/06/2015 0851   AST 17 04/06/2015 0851   ALT 12* 04/06/2015 0851   BILITOT 0.3 04/06/2015 0851        PENDING LABS:   RADIOGRAPHIC STUDIES:  Dg Chest 1 View  03/24/2015  CLINICAL DATA:  Port-A-Cath placement, history of sigmoid colon carcinoma EXAM: CHEST 1 VIEW COMPARISON:  Portable chest x-ray 02/13/2015 and CT chest of 03/15/2015 FINDINGS: A right-sided Port-A-Cath is present. The tip is seen to the lower SVC. No pneumothorax is noted. The lungs are clear. Heart size is stable. IMPRESSION: Right-sided Port-A-Cath tip at low SVC.  No pneumothorax Electronically Signed   By: Ivar Drape M.D.   On: 03/24/2015 13:58   Ct Chest W Contrast  03/15/2015  CLINICAL DATA:  Transverse colon adenocarcinoma status post right hemicolectomy on 02/10/2015, presenting for chest staging. EXAM: CT CHEST WITH CONTRAST TECHNIQUE: Multidetector CT imaging of the chest was performed during intravenous contrast administration. CONTRAST:  9mL OMNIPAQUE IOHEXOL 300 MG/ML  SOLN COMPARISON:  02/17/2010 chest CT.  02/13/2015 chest radiograph. FINDINGS: Mediastinum/Nodes: Normal heart size. No pericardial fluid/thickening. Left anterior descending coronary atherosclerosis. Great vessels are normal in course and caliber. No central pulmonary emboli. Normal visualized thyroid. Normal esophagus. No pathologically enlarged axillary, mediastinal  or hilar lymph nodes. Lungs/Pleura: No pneumothorax. No pleural effusion. Mild centrilobular emphysema and diffuse bronchial wall thickening. There is a subsolid 1.1 x 1.1 cm left upper lobe pulmonary nodule (series 3/ image 30), new since 02/17/2010. There is patchy centrilobular nodularity throughout both lungs, upper lobe predominant, not appreciably changed since 02/17/2010. No acute consolidative airspace disease or additional significant pulmonary nodules. Upper abdomen: Unremarkable. Musculoskeletal: No aggressive appearing focal osseous lesions. Mild degenerative changes are seen in the thoracic spine. IMPRESSION: 1. Subsolid 1.1 cm left upper lobe pulmonary nodule, new since 02/17/2010, indeterminate (favor either a focal irregular scar or primary lung neoplasm, unlikely to represent a metastasis). Initial follow-up by chest CT without contrast is recommended in 3 months to confirm persistence. This recommendation follows the consensus statement: Recommendations for the Management of Subsolid Pulmonary Nodules Detected at CT: A Statement from the [place] as published in Radiology 2013; 266:304-317. 2. Patchy upper lobe predominant centrilobular nodularity  throughout both lungs, not appreciably changed since 02/17/2010, likely representing smoking related interstitial lung disease (respiratory bronchiolitis if the patient is asymptomatic or respiratory bronchiolitis - interstitial lung disease (RB-ILD) if the patient is symptomatic). 3. Mild centrilobular emphysema and diffuse bronchial wall thickening, suggesting COPD. 4. Coronary atherosclerosis. Electronically Signed   By: Ilona Sorrel M.D.   On: 03/15/2015 09:38   Dg C-arm 1-60 Min-no Report  03/24/2015  CLINICAL DATA: surgery, right chest port insertion C-ARM 1-60 MINUTES Fluoroscopy was utilized by the requesting physician.  No radiographic interpretation.     PATHOLOGY:    ASSESSMENT AND PLAN:  Adenocarcinoma of transverse colon  ([place]) Stage IIIC [id number]) adenocarcinoma of transverse colon, S/P definitive surgery with a right extended hemicolectomy by Dr. Clayburn [name].  Now on systemic adjuvant therapy consisting of FOLFOX.  Oncology history is up to day.  Nadir check labs today: CBC diff, CMET.  Laboratory work today is excellent. No evidence of any cytopenias or bone marrow intolerance.  He tolerated his first cycle of chemotherapy without any major complaints. He notes a 24 hour history of fatigue the day following chemotherapy and minor cold intolerance that is resolving as expected; otherwise no other complaints associated with chemotherapy.  I've recommended over-the-counter pain medication for his right shoulder/chest pain associated with his Port-A-Cath placement. I've also recommended other over-the-counter options regarding sleeping aids including diphenhydramine, acetaminophen plus diphenhydramine, and melatonin. He does have amitriptyline at home which is very effective for his insomnia, however, most mornings he has groggy following taking this medication. The aforementioned other options should not cause this issue.  Return in 1 week with pre-treatment labs: CBC diff, CMET  Return in 1 week for follow-up and cycle 2 of FOLFOX.    THERAPY PLAN:  Adjuvant systemic chemotherapy with FOLFOX beginning on 03/30/2015.  All questions were answered. The patient knows to call the clinic with any problems, questions or concerns. We can certainly see the patient much sooner if necessary.  Patient and plan discussed with Dr. Ancil Linsey and she is in agreement with the aforementioned.   This note is electronically signed by: Doy Mince 04/06/2015 9:40 AM

## 2015-04-06 ENCOUNTER — Encounter (HOSPITAL_COMMUNITY): Payer: Medicaid Other

## 2015-04-06 ENCOUNTER — Encounter (HOSPITAL_BASED_OUTPATIENT_CLINIC_OR_DEPARTMENT_OTHER): Payer: Medicaid Other | Admitting: Oncology

## 2015-04-06 ENCOUNTER — Encounter (HOSPITAL_COMMUNITY): Payer: Self-pay | Admitting: Oncology

## 2015-04-06 VITALS — BP 116/74 | HR 112 | Temp 97.8°F | Resp 18 | Wt 226.2 lb

## 2015-04-06 DIAGNOSIS — C184 Malignant neoplasm of transverse colon: Secondary | ICD-10-CM

## 2015-04-06 LAB — COMPREHENSIVE METABOLIC PANEL
ALT: 12 U/L — ABNORMAL LOW (ref 17–63)
ANION GAP: 7 (ref 5–15)
AST: 17 U/L (ref 15–41)
Albumin: 3.8 g/dL (ref 3.5–5.0)
Alkaline Phosphatase: 47 U/L (ref 38–126)
BILIRUBIN TOTAL: 0.3 mg/dL (ref 0.3–1.2)
BUN: 11 mg/dL (ref 6–20)
CHLORIDE: 102 mmol/L (ref 101–111)
CO2: 30 mmol/L (ref 22–32)
Calcium: 9 mg/dL (ref 8.9–10.3)
Creatinine, Ser: 0.79 mg/dL (ref 0.61–1.24)
Glucose, Bld: 112 mg/dL — ABNORMAL HIGH (ref 65–99)
POTASSIUM: 4 mmol/L (ref 3.5–5.1)
Sodium: 139 mmol/L (ref 135–145)
TOTAL PROTEIN: 7.1 g/dL (ref 6.5–8.1)

## 2015-04-06 LAB — CBC WITH DIFFERENTIAL/PLATELET
BASOS ABS: 0 10*3/uL (ref 0.0–0.1)
Basophils Relative: 0 %
Eosinophils Absolute: 0.2 10*3/uL (ref 0.0–0.7)
Eosinophils Relative: 3 %
HEMATOCRIT: 42.1 % (ref 39.0–52.0)
Hemoglobin: 14 g/dL (ref 13.0–17.0)
LYMPHS PCT: 22 %
Lymphs Abs: 1.5 10*3/uL (ref 0.7–4.0)
MCH: 30.7 pg (ref 26.0–34.0)
MCHC: 33.3 g/dL (ref 30.0–36.0)
MCV: 92.3 fL (ref 78.0–100.0)
MONO ABS: 0.4 10*3/uL (ref 0.1–1.0)
Monocytes Relative: 6 %
NEUTROS ABS: 4.6 10*3/uL (ref 1.7–7.7)
Neutrophils Relative %: 69 %
PLATELETS: 257 10*3/uL (ref 150–400)
RBC: 4.56 MIL/uL (ref 4.22–5.81)
RDW: 13.5 % (ref 11.5–15.5)
WBC: 6.7 10*3/uL (ref 4.0–10.5)

## 2015-04-06 NOTE — Patient Instructions (Addendum)
[name] at [facility] Discharge Instructions  RECOMMENDATIONS MADE BY THE CONSULTANT AND ANY TEST RESULTS WILL BE SENT TO YOUR REFERRING PHYSICIAN.   Exam and discussion by [name] today Lab work today looks good You can take ibuprofen (take 1 or 2) twice a day as needed for the pain in your arm, or you can try alieve You can use benadryl to use to help you sleep, or tylenol PM.  Melatonin 5mg  to help our sleep, this is natural, you make this naturally in your body. Chemotherapy as scheduled Return to see the doctor as scheduled Please call the clinic if you have any questions or concerns     Thank you for choosing [place] at [facility] to provide your oncology and hematology care.  To afford each patient quality time with our provider, please arrive at least 15 minutes before your scheduled appointment time.   Beginning January 23rd 2017 lab work for the [facility] will be done in the  Main lab at [facility] on 1st floor. If you have a lab appointment with the [place] please come in thru the  Main Entrance and check in at the main information desk  You need to re-schedule your appointment should you arrive 10 or more minutes late.  We strive to give you quality time with our providers, and arriving late affects you and other patients whose appointments are after yours.  Also, if you no show three or more times for appointments you may be dismissed from the clinic at the providers discretion.     Again, thank you for choosing [facility].  Our hope is that these requests will decrease the amount of time that you wait before being seen by our physicians.       _____________________________________________________________  Should you have questions after your visit to [facility], please contact our office at [phone] between the hours of 8:30 a.m. and 4:30 p.m.  Voicemails left after 4:30  p.m. will not be returned until the following business day.  For prescription refill requests, have your pharmacy contact our office.

## 2015-04-13 ENCOUNTER — Encounter (HOSPITAL_BASED_OUTPATIENT_CLINIC_OR_DEPARTMENT_OTHER): Payer: Medicaid Other | Admitting: Hematology & Oncology

## 2015-04-13 ENCOUNTER — Encounter (HOSPITAL_COMMUNITY): Payer: Medicaid Other | Attending: Hematology & Oncology

## 2015-04-13 ENCOUNTER — Encounter: Payer: Self-pay | Admitting: *Deleted

## 2015-04-13 ENCOUNTER — Ambulatory Visit (HOSPITAL_COMMUNITY): Payer: Self-pay | Admitting: Oncology

## 2015-04-13 ENCOUNTER — Encounter (HOSPITAL_COMMUNITY): Payer: Self-pay | Admitting: Hematology & Oncology

## 2015-04-13 VITALS — BP 144/83 | HR 93 | Temp 98.3°F | Resp 20 | Wt 228.0 lb

## 2015-04-13 DIAGNOSIS — C172 Malignant neoplasm of ileum: Secondary | ICD-10-CM

## 2015-04-13 DIAGNOSIS — K2960 Other gastritis without bleeding: Secondary | ICD-10-CM

## 2015-04-13 DIAGNOSIS — C184 Malignant neoplasm of transverse colon: Secondary | ICD-10-CM | POA: Insufficient documentation

## 2015-04-13 DIAGNOSIS — Z5111 Encounter for antineoplastic chemotherapy: Secondary | ICD-10-CM

## 2015-04-13 DIAGNOSIS — E538 Deficiency of other specified B group vitamins: Secondary | ICD-10-CM

## 2015-04-13 LAB — COMPREHENSIVE METABOLIC PANEL
ALK PHOS: 64 U/L (ref 38–126)
ALT: 18 U/L (ref 17–63)
AST: 20 U/L (ref 15–41)
Albumin: 3.5 g/dL (ref 3.5–5.0)
Anion gap: 5 (ref 5–15)
BILIRUBIN TOTAL: 0.4 mg/dL (ref 0.3–1.2)
BUN: 8 mg/dL (ref 6–20)
CALCIUM: 8.6 mg/dL — AB (ref 8.9–10.3)
CHLORIDE: 104 mmol/L (ref 101–111)
CO2: 30 mmol/L (ref 22–32)
CREATININE: 0.73 mg/dL (ref 0.61–1.24)
Glucose, Bld: 103 mg/dL — ABNORMAL HIGH (ref 65–99)
Potassium: 3.6 mmol/L (ref 3.5–5.1)
Sodium: 139 mmol/L (ref 135–145)
TOTAL PROTEIN: 6.9 g/dL (ref 6.5–8.1)

## 2015-04-13 LAB — CBC WITH DIFFERENTIAL/PLATELET
BASOS PCT: 1 %
Basophils Absolute: 0 10*3/uL (ref 0.0–0.1)
EOS ABS: 0.1 10*3/uL (ref 0.0–0.7)
EOS PCT: 2 %
HCT: 40.5 % (ref 39.0–52.0)
HEMOGLOBIN: 13.4 g/dL (ref 13.0–17.0)
LYMPHS ABS: 1.4 10*3/uL (ref 0.7–4.0)
Lymphocytes Relative: 26 %
MCH: 30.2 pg (ref 26.0–34.0)
MCHC: 33.1 g/dL (ref 30.0–36.0)
MCV: 91.2 fL (ref 78.0–100.0)
Monocytes Absolute: 0.7 10*3/uL (ref 0.1–1.0)
Monocytes Relative: 13 %
NEUTROS PCT: 58 %
Neutro Abs: 3.2 10*3/uL (ref 1.7–7.7)
PLATELETS: 199 10*3/uL (ref 150–400)
RBC: 4.44 MIL/uL (ref 4.22–5.81)
RDW: 13.6 % (ref 11.5–15.5)
WBC: 5.5 10*3/uL (ref 4.0–10.5)

## 2015-04-13 MED ORDER — SODIUM CHLORIDE 0.9 % IV SOLN
2400.0000 mg/m2 | INTRAVENOUS | Status: DC
  Administered 2015-04-13: 5450 mg via INTRAVENOUS
  Filled 2015-04-13: qty 109

## 2015-04-13 MED ORDER — SODIUM CHLORIDE 0.9% FLUSH
10.0000 mL | INTRAVENOUS | Status: DC | PRN
  Administered 2015-04-13: 10 mL
  Filled 2015-04-13: qty 10

## 2015-04-13 MED ORDER — DEXTROSE 5 % IV SOLN
Freq: Once | INTRAVENOUS | Status: AC
  Administered 2015-04-13: 11:00:00 via INTRAVENOUS

## 2015-04-13 MED ORDER — PALONOSETRON HCL INJECTION 0.25 MG/5ML
INTRAVENOUS | Status: AC
  Filled 2015-04-13: qty 5

## 2015-04-13 MED ORDER — AMITRIPTYLINE HCL 25 MG PO TABS: 25.0000 mg | ORAL_TABLET | Freq: Every day | ORAL | Status: DC

## 2015-04-13 MED ORDER — OXALIPLATIN CHEMO INJECTION 100 MG/20ML
85.0000 mg/m2 | Freq: Once | INTRAVENOUS | Status: AC
  Administered 2015-04-13: 195 mg via INTRAVENOUS
  Filled 2015-04-13: qty 39

## 2015-04-13 MED ORDER — HYDROCODONE-ACETAMINOPHEN 5-325 MG PO TABS: 1.0000 | ORAL_TABLET | Freq: Four times a day (QID) | ORAL | Status: DC | PRN

## 2015-04-13 MED ORDER — CYANOCOBALAMIN 1000 MCG/ML IJ SOLN
INTRAMUSCULAR | Status: AC
  Filled 2015-04-13: qty 1

## 2015-04-13 MED ORDER — LEUCOVORIN CALCIUM INJECTION 350 MG
400.0000 mg/m2 | Freq: Once | INTRAVENOUS | Status: AC
  Administered 2015-04-13: 912 mg via INTRAVENOUS
  Filled 2015-04-13: qty 45.6

## 2015-04-13 MED ORDER — SODIUM CHLORIDE 0.9 % IV SOLN
10.0000 mg | Freq: Once | INTRAVENOUS | Status: AC
  Administered 2015-04-13: 10 mg via INTRAVENOUS
  Filled 2015-04-13: qty 1

## 2015-04-13 MED ORDER — CYANOCOBALAMIN 1000 MCG/ML IJ SOLN
1000.0000 ug | INTRAMUSCULAR | Status: AC
  Administered 2015-04-13: 1000 ug via INTRAMUSCULAR

## 2015-04-13 MED ORDER — FLUOROURACIL CHEMO INJECTION 2.5 GM/50ML
400.0000 mg/m2 | Freq: Once | INTRAVENOUS | Status: AC
  Administered 2015-04-13: 900 mg via INTRAVENOUS
  Filled 2015-04-13: qty 18

## 2015-04-13 MED ORDER — PALONOSETRON HCL INJECTION 0.25 MG/5ML
0.2500 mg | Freq: Once | INTRAVENOUS | Status: AC
  Administered 2015-04-13: 0.25 mg via INTRAVENOUS

## 2015-04-13 NOTE — Patient Instructions (Addendum)
..  [place] at [facility] Discharge Instructions  RECOMMENDATIONS MADE BY THE CONSULTANT AND ANY TEST RESULTS WILL BE SENT TO YOUR REFERRING PHYSICIAN.  2 weeks for treatment B12 weekly today and for the next 3 weeks then will be monthly See Dr. Whitney Muse as scheduled with treatment   Thank you for choosing [place] at [facility] to provide your oncology and hematology care.  To afford each patient quality time with our provider, please arrive at least 15 minutes before your scheduled appointment time.   Beginning January 23rd 2017 lab work for the [facility] will be done in the  Main lab at [facility] on 1st floor. If you have a lab appointment with the [place] please come in thru the  Main Entrance and check in at the main information desk  You need to re-schedule your appointment should you arrive 10 or more minutes late.  We strive to give you quality time with our providers, and arriving late affects you and other patients whose appointments are after yours.  Also, if you no show three or more times for appointments you may be dismissed from the clinic at the providers discretion.     Again, thank you for choosing [facility].  Our hope is that these requests will decrease the amount of time that you wait before being seen by our physicians.       _____________________________________________________________  Should you have questions after your visit to [facility], please contact our office at [phone] between the hours of 8:30 a.m. and 4:30 p.m.  Voicemails left after 4:30 p.m. will not be returned until the following business day.  For prescription refill requests, have your pharmacy contact our office.         Resources For Cancer Patients and their Caregivers ? American Cancer Society: Can assist with transportation, wigs, general needs, runs Look Good Feel Better.         301-140-7226 ? Cancer Care: Provides financial assistance, online support groups, medication/co-pay assistance.  1-800-813-HOPE 914-369-5602) ? [employer] Assists [place] cancer patients and their families through emotional , educational and financial support.  (217) 487-5645 ? [place] DSS Where to apply for food stamps, Medicaid and utility assistance. 248-833-2837 ? [employer]: Transportation to medical appointments. 701-285-2465 ? Social Security Administration: May apply for disability if have a Stage IV cancer. (608)126-2485 785-411-5321 ? [employer], Disability and Transit Services: Assists with nutrition, care and transit needs. 646-628-8566

## 2015-04-13 NOTE — Progress Notes (Signed)
[place] Clinical Social Work  Clinical Social Work was referred by nurse for assessment of psychosocial needs due to medication coverage concerns. Clinical Social Worker met with patient during treatment  to offer support and assess for needs. Pt still is medicaid pending and has been using medassist through the free clinic to get medications covered. Currently, there are 2 meds they will not cover that pt needs. CSW and  financial counselor problem solved and worked to find some assistance through our own medication asst plan. The idea is this would be a one time assistance, as pt hopefully will have medicaid in the near future. Pt reports to have a strong network of friends through church and the local community that are assisting him. He is also using [employer] for transportation at times. Pt agrees to reach out as other needs arise.     Clinical Social Work interventions: Resource asst Supportive listening  [facility] Tuesdays   [phone]

## 2015-04-13 NOTE — Progress Notes (Signed)
[facility] NOTE  Patient Care Team: [name], PA-C as PCP - General (Physician Assistant) Danie Binder, MD as Consulting Physician (Gastroenterology)  CHIEF COMPLAINTS/PURPOSE OF CONSULTATION:  [phone] adenocarcinoma of the terminal ileum, Stage III Peri-neural and LVI noted, 4/19 nodes involved Colonoscopy on 01/12/2015 with Dr Oneida Alar showing mass at the splenic flexure EGD on 01/12/2015 with moderate erosive gastritis from ASA use CEA 01/12/2015 at 6.6 ng/ml Extended R hemicolectomy with Dr. Adonis Huguenin on 02/10/2015   HISTORY OF PRESENTING ILLNESS:  Erik [name] 60 y.o. male is here on referral from Dr. Adonis Huguenin for follow-up of stage III CRC.  Erik [name] returns to the [facility] to receive cycle #2 of FOLFOX. He is alone today.  He reports that he did okay with his treatment. He notes that his feet, toes, and fingertips are fine today; that he can button his shirts fine, and that his toes feel okay. He notes that he is eating fine, and that his appetite is "pretty much" getting back to normal. He says that although he will feel full after he eats only a little bit, he's been eating small amounts off and on all day. Other than that, he's notes that he's been fine. He denies any mouth sores, but does feel like drinking cold stuff "tingles a little bit." He reports that this feeling never got bad; just tingling, similar to his hands.   He comments that he's had a little diarrhea ever since the surgery, but that it hasn't gotten any worse, and is even a little better now. He does clearly note that he's had it ever since the surgery.  He feels like his mood has been fine, that he just gets a little "down" sometimes, but he's okay. He attributes this to pre-existing mental health issues. He describes his main feeling as "why do I have to go through this?" or that sort of thing, but as "nothing serious." He says that because he's by himself a  lot, it doesn't help; but that these mood swings are, again, "nothing serious."  He says that his amitriptyline is the only thing that helps him sleep.  He saw his surgeon last week and was told everything looked good. He has no other concerns today. He is here for ongoing therapy and follow-up.     Adenocarcinoma of transverse colon ([place])   01/12/2015 Pathologic Stage Colon, biopsy, distal transverse - TUBULOVILLOUS ADENOMA WITH HIGH GRADE DYSPLASIA.   01/12/2015 Procedure Colonoscopy by Dr. Oneida Alar.   01/12/2015 Tumor Marker CEA: 6.6 (H)    01/18/2015 Imaging CT abd/pelvis- Apple-core lesion identified in the mid transverse colon without obstruction. No evidence for lymphadenopathy in the gastrohepatic ligament or omentum.  Stable 8 mm hypo attenuating lesion in the left liver, likely a cyst.   02/10/2015 Initial Diagnosis Adenocarcinoma of transverse colon ([place])   02/12/2015 Definitive Surgery [name], Extended right hemicolectomy    02/12/2015 Pathology Results Mucinous adenocarcinoma with penetration of visceral peritoneum, 4/19 lymph nodes for metasattic disease, negative resection margins, with LVI and perineural invasion   03/30/2015 -  Chemotherapy FOLFOX     MEDICAL HISTORY:  Past Medical History  Diagnosis Date  . Head trauma 2001    closed head injury; coma for 4 weeks  . Depression   . Abnormal stress echocardiogram   . Hypercholesterolemia   . Hypertension   . Alcohol abuse     Heavy Use up until 2010  . GERD (gastroesophageal  reflux disease)   . PTSD (post-traumatic stress disorder)   . Anxiety   . Adenocarcinoma of transverse colon ([place]) 02/10/2015   Ended up with the cane due to severe brain trauma and head injury in '01 Fell off of a piece of equipment at work and his head was the first thing to hit the concrete floor No one knows the origin of the fall He was in a coma for four and a half weeks Permanent balance/equilibrium problem as a result Carries the  cane so that if he starts to lose his balance, he won't fall Used to fall pretty frequently; had 2-3 concussions from blacking out and falling against brick walls or concrete  Last year to year and a half, he says it's gotten better Hasn't blacked out, but does fall every once in a while Says this mostly happens after he's been sitting or laying for a while  Example: If he gets up to use the bathroom in the middle of the night, he will stumble and fall against the wall He denies any problems with neuropathy, saying he will get dizzy in his head He says sometimes he knows when it's coming on, and sometimes he won't His vision gets a little blurry too Works with neurologist Dr. Susa Raring at [facility] in [place]: Past Surgical History  Procedure Laterality Date  . Kidney surgery      >30 years ago  . Cardiac cath    . Craniotomy  2001  . Head injury surgery    . Colonoscopy  2011    Dr. Oneida Alar: multiple adenomas and hyperplastic polyps  . Colonoscopy with propofol N/A 01/12/2015    Procedure: COLONOSCOPY WITH PROPOFOL;  Surgeon: Danie Binder, MD;  [facility]: [facility];  Service: Endoscopy;  Laterality: N/A;  1030  . Esophagogastroduodenoscopy (egd) with propofol N/A 01/12/2015    Procedure: ESOPHAGOGASTRODUODENOSCOPY (EGD) WITH PROPOFOL;  Surgeon: Danie Binder, MD;  Location: [facility];  Service: Endoscopy;  Laterality: N/A;  . Hernia repair Right 2012    Inguinal- Forestine Na  . Laparoscopic right hemi colectomy Left 02/10/2015    Procedure: LAPAROSCOPIC THEN OPEN RIGHT HEMI COLECTOMY;  Surgeon: [name], MD;  Location: [facility] ORS;  Service: General;  Laterality: Left;  . Portacath placement N/A 03/24/2015    Procedure: INSERTION PORT-A-CATH;  Surgeon: Jules Husbands, MD;  Location: [facility] ORS;  Service: General;  Laterality: N/A;    SOCIAL HISTORY: Social History   Social History  . Marital Status: Divorced    Spouse Name: N/A  . Number of Children: N/A    . Years of Education: N/A   Occupational History  . Full time    Social History Main Topics  . Smoking status: Former Smoker -- 0.25 packs/day    Types: Cigarettes    Quit date: 02/10/2015  . Smokeless tobacco: Never Used     Comment: patient is aware he needs to quit  . Alcohol Use: 0.0 oz/week    0 Standard drinks or equivalent per week     Comment: beer occ, history of ETOH abuse in remote past.   . Drug Use: Yes    Special: Marijuana     Comment: marijuana last 2012  . Sexual Activity:    Partners: Female   Other Topics Concern  . 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 Ataxia Neg Hx   . Chorea Neg Hx   . Dementia Neg Hx   . Mental retardation Neg Hx   . Migraines Neg Hx   . Multiple sclerosis Neg Hx   . Neurofibromatosis Neg Hx   . Neuropathy Neg Hx   . Parkinsonism Neg Hx   . Seizures Neg Hx   . Stroke Neg Hx   . Colon cancer Neg Hx   . Pulmonary embolism Mother   . Breast cancer Mother   . Heart disease Father   . Cancer Father 3    Leukemia  . Cancer Mother   . Cancer Paternal Grandfather     Lung   indicated that his mother is deceased. He indicated that his father is deceased.   Mother and father both deceased Mother was 41; died of blood clots in her legs; pulmonary embolism Had a hip  replacement and "a bunch of other stuff;" says it's a long complicated story Says "a woman got power of attorney over his mother and they stopped her physical therapy and put her in a nursing home, and due to her inactivity she got blood clots" Father ws 72, died of heart failure No siblings  ALLERGIES:  has No Known Allergies.  MEDICATIONS:  Current Outpatient Prescriptions  Medication Sig Dispense Refill  . albuterol (PROVENTIL HFA;VENTOLIN HFA) 108 (90 BASE) MCG/ACT inhaler Inhale 2 puffs into the lungs every 6 (six) hours as needed for wheezing or shortness of breath. 1 Inhaler 1  . dexlansoprazole (DEXILANT) 60 MG capsule Take 1 capsule (60 mg total) by mouth daily. 90 capsule 1  . fluorouracil CALGB 09233 in sodium chloride 0.9 % 150 mL Inject into the vein. Reported on 03/22/2015    . FLUoxetine (PROZAC) 20 MG capsule Take 20 mg by mouth 2 (two) times daily.     Marland Kitchen leucovorin in dextrose 5 % 250 mL Inject into the vein every 14 (fourteen) days. Reported on 03/22/2015    . lidocaine-prilocaine (EMLA) cream Apply a quarter size amount to port site 1 hour prior to chemo. Do not rub in. Cover with plastic wrap. 30 g 3  . OXALIPLATIN IV Inject into the vein every 14 (fourteen) days. Reported on 03/22/2015    . rosuvastatin (CRESTOR) 40 MG tablet Take 1 tablet (40 mg total) by mouth daily. 90 tablet 1  . sildenafil (VIAGRA) 100 MG tablet Take 0.5-1 tablets (50-100 mg total) by mouth daily as needed for erectile dysfunction. 10 tablet 1  . amitriptyline (ELAVIL) 25 MG tablet TAKE ONE TABLET BY MOUTH AT BEDTIME. (Patient not taking: Reported on 04/13/2015) 30 tablet 2  . atorvastatin (LIPITOR) 20 MG tablet Take 1 tablet (20 mg total) by mouth daily. (Patient not taking: Reported on 04/02/2015) 90 tablet 1  . HYDROcodone-acetaminophen (NORCO) 5-325 MG tablet Take 1 tablet by mouth every 6 (six) hours as needed for moderate pain. (Patient not  taking: Reported on 04/02/2015) 20 tablet 0   No current  facility-administered medications for this visit.    Review of Systems  Constitutional: Negative.  Negative for fever, chills, weight loss and malaise/fatigue.       Healing well from his abdominal surgery for his colon cancer. Walks with cane due to traumatic head injury in the past.  HENT: Negative.  Negative for congestion, hearing loss, nosebleeds, sore throat and tinnitus.   Eyes: Negative.  Negative for blurred vision, double vision, pain and discharge.  Respiratory: Negative.  Negative for cough, hemoptysis, sputum production, shortness of breath and wheezing.   Cardiovascular: Negative.  Negative for chest pain, palpitations, claudication, leg swelling and PND.  Gastrointestinal: Positive for diarrhea. Negative for heartburn, nausea, vomiting, abdominal pain, constipation, blood in stool and melena.  Genitourinary: Negative.  Negative for dysuria, urgency, frequency and hematuria.  Musculoskeletal: Positive for falls. Negative for myalgias and joint pain.       Due to traumatic head injury in the past.  Skin: Negative.  Negative for itching and rash.  Neurological: Negative.  Negative for dizziness, tingling, tremors, sensory change, speech change, focal weakness, seizures, loss of consciousness, weakness and headaches.       Traumatic head injury in the past.  Endo/Heme/Allergies: Negative.  Does not bruise/bleed easily.  Psychiatric/Behavioral: Negative.  Negative for depression, suicidal ideas, memory loss and substance abuse. The patient is not nervous/anxious and does not have insomnia.   All other systems reviewed and are negative.  14 point ROS was done and is otherwise as detailed above or in HPI   PHYSICAL EXAMINATION: ECOG PERFORMANCE STATUS: 0 - Asymptomatic  Vitals - 1 value per visit 05/15/6757  SYSTOLIC 163  DIASTOLIC 83  Pulse 93  Temperature 98.3  Respirations 20  Weight (lb) 228  Height   BMI 29.26    Physical Exam  Constitutional: He is oriented to  person, place, and time and well-developed, well-nourished, and in no distress.  HENT:  Head: Normocephalic and atraumatic.  Nose: Nose normal.  Mouth/Throat: Oropharynx is clear and moist. No oropharyngeal exudate.  Eyes: Conjunctivae and EOM are normal. Pupils are equal, round, and reactive to light. Right eye exhibits no discharge. Left eye exhibits no discharge. No scleral icterus.  Neck: Normal range of motion. Neck supple. No tracheal deviation present. No thyromegaly present.  Cardiovascular: Normal rate, regular rhythm and normal heart sounds.  Exam reveals no gallop and no friction rub.   No murmur heard. Pulmonary/Chest: Effort normal and breath sounds normal. He has no wheezes. He has no rales. Port site C/D/I Abdominal: Soft. Bowel sounds are normal. He exhibits no distension and no mass. There is no tenderness. There is no rebound and no guarding.  Surgical incision site on abdomen examined, well healed Musculoskeletal: Normal range of motion. He exhibits no edema.  Lymphadenopathy:    He has no cervical adenopathy.  Neurological: He is alert and oriented to person, place, and time. No cranial nerve deficit. Coordination normal.  Uses cane  Skin: Skin is warm and dry. No rash noted.  Psychiatric: Mood, memory, affect and judgment normal.  Nursing note and vitals reviewed.   LABORATORY DATA:  I have reviewed the data as listed Lab Results  Component Value Date   WBC 5.5 04/13/2015   HGB 13.4 04/13/2015   HCT 40.5 04/13/2015   MCV 91.2 04/13/2015   PLT 199 04/13/2015   CMP     Component Value Date/Time   NA 139 04/13/2015  0932   K 3.6 04/13/2015 0932   CL 104 04/13/2015 0932   CO2 30 04/13/2015 0932   GLUCOSE 103* 04/13/2015 0932   BUN 8 04/13/2015 0932   CREATININE 0.73 04/13/2015 0932   CREATININE 0.86 12/16/2014 0844   CALCIUM 8.6* 04/13/2015 0932   PROT 6.9 04/13/2015 0932   ALBUMIN 3.5 04/13/2015 0932   AST 20 04/13/2015 0932   ALT 18 04/13/2015 0932    ALKPHOS 64 04/13/2015 0932   BILITOT 0.4 04/13/2015 0932   GFRNONAA >60 04/13/2015 0932   GFRNONAA >89 12/16/2014 0844   GFRAA >60 04/13/2015 0932   GFRAA >89 12/16/2014 0844   PATHOLOGY Surgical Pathology  CASE: [id number]  PATIENT: Erik [name]  Surgical Pathology Report   SPECIMEN SUBMITTED:  A. Peritoneal biopsy  B. Extended hemicolectomy, right   CLINICAL HISTORY:  Potential metastatic colon cancer; Tubular adenoma of colon   PRE-OPERATIVE DIAGNOSIS:    POST-OPERATIVE DIAGNOSIS:    DIAGNOSIS:  A. PERITONEUM; BIOPSY:  - FIBROADIPOSE TISSUE WITH INFLAMMATION.   B. RIGHT COLON; EXTENDED RIGHT HEMICOLECTOMY:  - MUCINOUS ADENOCARCINOMA WITH PENETRATION OF THE VISCERAL PERITONEUM.  - SEPARATE TUBULAR ADENOMA OF THE CECUM.  - APPENDIX WITHIN NORMAL LIMITS.  - FOUR OF NINETEEN LYMPH NODES INVOLVED BY METASTATIC CARCINOMA (4/19).  - THE MARGINS OF RESECTION ARE NEGATIVE.  - SEE SUMMARY BELOW.   Note:  Ample material is available for MSI and other ancillary testing.    COLON AND RECTUM: Resection, Including Transanal Disk Excision of Rectal  Neoplasms  Specimens InvolvedB: Extended hemicolectomy, right   Colon and Rectum, Resection Cancer Case Summary  SPECIMEN  Specimen: Terminal ileum  Cecum  Appendix  Ascending colon  Procedure:   Right hemicolectomy  Primary Tumor Site: Right (ascending) colon  Additional Sites Involved by Tumor:   None identified  Macroscopic Tumor Perforation:   Cannot be determined  unspecified  TUMOR  Histologic Type:  Mucinous adenocarcinoma  Histologic Grade:  Other (specify)  Defer to optional MSI testing for grade assignment: Mucinous  adenocarcinoma is no longer considered definitionally high grade. MSI-H  carcinomas are low grade while MSS or MSI-L carcinomas are high grade  EXTENT  Tumor Size:  Greatest dimension (cm)  4.8cm  Microscopic Tumor Extension: Tumor penetrates to the surface of the  visceral  peritoneum (serosa)  MARGINS  Proximal Margin:  Uninvolved by invasive carcinoma  - No adenoma or intraepithelial neoplasia / dysplasia identified  Distal Margin: Uninvolved by invasive carcinoma  - No adenoma or intraepithelial neoplasia / dysplasia identified  Circumferential (Radial) or Mesenteric Margin:  Uninvolved by invasive  carcinoma  Deep Margin:  n/a  ACCESSORY FINDINGS  Lymph-Vascular Invasion: Present  Perineural Invasion:   Present  Tumor Deposits:   Not identified  not identified  STAGE (pTNM)  TNM Descriptors:  n/a  Primary Tumor (pT): pT4a: Tumor penetrates the visceral peritoneum  Regional Lymph Nodes (pN)  pN2a: Metastasis in 4 to 6 regional lymph nodes  Number of Lymph Nodes Examined:  Specify  19  Number of Lymph Nodes Involved:  Specify  4  Distant Metastasis (pM): Not applicable    GROSS DESCRIPTION:  A. Intra Operative Consultation:    Received: Fresh    Specimen: Peritoneal biopsy    Pathologic Evaluation: Frozen section    Diagnosis: Frozen section A1 Peritoneal Biopsy: Grossly fatty  specimen. Fibrosis and inflammation. No carcinoma seen.    Communicated to: Dr. Adonis Huguenin at 2:33 PM on 02/10/2015 by Dr. Luana Shu    Tissue submitted:  A1   Labeled: Peritoneal biopsy   Tissue fragment(s): 1   Size: 1.5 x 0.6 x 0.3 cm   Description: Piece of red yellow adipose tissue, sectioned   Entirely submitted in one cassette(s).    B. Labeled: Extended right hemicolectomy  Type of procedure: Right hemicolectomy  Measurement: Terminal ileum 7 cm in length by 5.5 cm in circumference,  appendix 5 cm in length and from 0.5-0.7 cm in diameter, cecum and right  colon measure 33.5 cm in length and range from 7-12.5 cm in  circumference.  Orientation: Serosa inked blue, radial fat margin inked black.  Number of mass(es): 1  Location of mass(es): Ascending colon  Size(s) of mass(es): 4.8 x 4.5 x 1.2 cm  Description of mass(es): Pink  red raised with rolled edges firm nodular  mass.  Bowel circumference at site of mass(es): 7.5 cm  Gross depth of invasion: Lesion appears to grossly penetrate through the  wall into the pericolonic fat and is grossly visualized on the serosa.  Macroscopic perforation: Yes  Margins:    Proximal: 26.8 cm  Distal: 5 cm  Circumferential (Radial): 6.5 cm  Mesenteric: Less than 0.1 cm   Luminal obstruction: At least 75% obstruction  Other remarkable findings: Ileocecal valve is somewhat lipomatous. There  is a small raised pink-red polyp located 4.5 cm away from the ileocecal  valve pouch, appears to be confined to the mucosa.  Lymph nodes: Dissection of the pericolonic fat reveals 18 pink red to  dark brown lymph node candidates ranging from 0.3 cm in diameter up to  1.6 x 0.9 x 0.8 cm, some of which are grossly suspicious for tumor  involvement.  Block summary:  1-terminal ileal margin  2-distal colonic margin  3-radial fat margin  4-7-sections of mass including inked serosa and underlying pericolonic  fat  8-ileocecal valve  9-sections of appendix  10-polyp in cecal pouch  11-12- 5 lymph node candidates per cassette  13- 4 lymph nodes candidates  14-17 1 serially sectioned lymph node per cassette    Final Diagnosis performed by Delorse Lek, MD. Electronically signed  02/12/2015 2:17:44PM   The electronic signature indicates that the named Attending Pathologist  has evaluated the specimen   Technical component performed at [facility], [address] [place],  [place] [address]  Lab: [phone] Dir: Darrick Penna. Evette Doffing, MD   Professional component performed at [facility], [facility],  [place], [place], [place] [address]  Lab: [phone] Dir: Dellia Nims. Reuel Derby, MD    RADIOGRAPHIC STUDIES: I have personally reviewed the radiological images as listed and agreed with the findings in the report.  03/15/2015 Study Result     CLINICAL DATA:  Transverse colon adenocarcinoma status post right hemicolectomy on 02/10/2015, presenting for chest staging.  EXAM: CT CHEST WITH CONTRAST  TECHNIQUE: Multidetector CT imaging of the chest was performed during intravenous contrast administration.  CONTRAST: 43m OMNIPAQUE IOHEXOL 300 MG/ML SOLN  COMPARISON: 02/17/2010 chest CT. 02/13/2015 chest radiograph.  FINDINGS: Mediastinum/Nodes: Normal heart size. No pericardial fluid/thickening. Left anterior descending coronary atherosclerosis. Great vessels are normal in course and caliber. No central pulmonary emboli. Normal visualized thyroid. Normal esophagus. No pathologically enlarged axillary, mediastinal or hilar lymph nodes.  Lungs/Pleura: No pneumothorax. No pleural effusion. Mild centrilobular emphysema and diffuse bronchial wall thickening. There is a subsolid 1.1 x 1.1 cm left upper lobe pulmonary nodule (series 3/ image 30), new since 02/17/2010. There is patchy centrilobular nodularity throughout both lungs, upper lobe predominant, not appreciably  changed since 02/17/2010. No acute consolidative airspace disease or additional significant pulmonary nodules.  Upper abdomen: Unremarkable.  Musculoskeletal: No aggressive appearing focal osseous lesions. Mild degenerative changes are seen in the thoracic spine.  IMPRESSION: 1. Subsolid 1.1 cm left upper lobe pulmonary nodule, new since 02/17/2010, indeterminate (favor either a focal irregular scar or primary lung neoplasm, unlikely to represent a metastasis). Initial follow-up by chest CT without contrast is recommended in 3 months to confirm persistence. This recommendation follows the consensus statement: Recommendations for the Management of Subsolid Pulmonary Nodules Detected at CT: A Statement from the [place] as published in Radiology 2013; 266:304-317. 2. Patchy upper lobe predominant centrilobular nodularity throughout both lungs, not  appreciably changed since 02/17/2010, likely representing smoking related interstitial lung disease (respiratory bronchiolitis if the patient is asymptomatic or respiratory bronchiolitis - interstitial lung disease (RB-ILD) if the patient is symptomatic). 3. Mild centrilobular emphysema and diffuse bronchial wall thickening, suggesting COPD. 4. Coronary atherosclerosis.   Electronically Signed  By: Ilona Sorrel M.D.  On: 03/15/2015 09:38    01/18/2015   CLINICAL DATA: Subsequent encounter for colon mass found on colonoscopy last week.  EXAM: CT ABDOMEN AND PELVIS WITH CONTRAST  TECHNIQUE: Multidetector CT imaging of the abdomen and pelvis was performed using the standard protocol following bolus administration of intravenous contrast.  CONTRAST: 18m OMNIPAQUE IOHEXOL 300 MG/ML SOLN  COMPARISON: 09/28/2009  FINDINGS: Lower chest: Unremarkable.  Hepatobiliary: 8 mm hypo attenuating lesion in the anterior left liver is stable. There is no evidence for gallstones, gallbladder wall thickening, or pericholecystic fluid. No intrahepatic or extrahepatic biliary dilation.  Pancreas: No focal mass lesion. No dilatation of the main duct. No intraparenchymal cyst. No peripancreatic edema.  Spleen: No splenomegaly. No focal mass lesion.  Adrenals/Urinary Tract: No adrenal nodule or mass. Kidneys are unremarkable. No evidence for hydroureter. The urinary bladder appears normal for the degree of distention.  Stomach/Bowel: Stomach is nondistended. No gastric wall thickening. No evidence of outlet obstruction. Duodenum is normally positioned as is the ligament of Treitz. No small bowel wall thickening. No small bowel dilatation. The terminal ileum is normal. The appendix is normal. Apple-core lesion identified in the mid transverse colon without obstruction.  Vascular/Lymphatic: There is abdominal aortic atherosclerosis without aneurysm. No  gastrohepatic or hepatoduodenal ligament lymphadenopathy. No retroperitoneal lymphadenopathy. No definite lymphadenopathy adjacent to the transverse colon lesion. No evidence for lymphadenopathy in the gastrohepatic ligament or omentum.  Reproductive: Prostate gland is mildly enlarged with central dystrophic calcification  Other: No intraperitoneal free fluid.  Musculoskeletal: Bone windows reveal no worrisome lytic or sclerotic osseous lesions.  IMPRESSION: 1. Apple-core lesion identified in the mid transverse colon without obstruction. No evidence for lymphadenopathy in the gastrohepatic ligament or omentum. 2. Stable 8 mm hypo attenuating lesion in the left liver, likely a cyst.   Electronically Signed  By: EMisty StanleyM.D.  On: 01/18/2015 11:23  ASSESSMENT & PLAN:  [id number] adenocarcinoma of the terminal ileum, Stage III Peri-neural and LVI noted, 4/19 nodes involved Colonoscopy on 01/12/2015 with Dr FOneida Alarshowing mass at the splenic flexure EGD on 01/12/2015 with moderate erosive gastritis from ASA use CEA 01/12/2015 at 6.6 ng/ml Extended R hemicolectomy with Dr. WAdonis Hugueninon 02/10/2015 1.1 cm LUL pulmonary nodule B12 deficiency 149 pg/ml   We will proceed with cycle #2 of adjuvant FOLFOX.  He is on weekly B12. Plan is for transition to monthly B12 at completion of 4 weekly injection.   I want him to touch base with GAbby Potashtoday  to do a needs assessment, especially as regards his medications.  I have written a prescription for amitriptyline and hydrocodone.  I will see him back in 2 weeks for ongoing therapy, labs and physical exam.  All questions were answered. The patient knows to call the clinic with any problems, questions or concerns.  This document serves as a record of services personally performed by Ancil Linsey, MD. It was created on her behalf by Toni Amend, a trained medical scribe. The creation of this record is based on the scribe's  personal observations and the provider's statements to them. This document has been checked and approved by the attending provider.  I have reviewed the above documentation for accuracy and completeness and I agree with the above.  This note was electronically signed.  Molli Hazard, MD  04/13/2015 10:17 AM

## 2015-04-13 NOTE — Progress Notes (Signed)
Erik [name] presents today for injection per MD orders. B12 1000 mcg administered IM in left Upper Arm. Administration without incident. Patient tolerated well.   Tolerated chemo well. Continuous infusion pump intact. Ambulatory on discharge home to self.

## 2015-04-14 DIAGNOSIS — E538 Deficiency of other specified B group vitamins: Secondary | ICD-10-CM | POA: Insufficient documentation

## 2015-04-15 ENCOUNTER — Encounter (HOSPITAL_BASED_OUTPATIENT_CLINIC_OR_DEPARTMENT_OTHER): Payer: Medicaid Other

## 2015-04-15 ENCOUNTER — Encounter (HOSPITAL_COMMUNITY): Payer: Self-pay

## 2015-04-15 VITALS — BP 138/83 | HR 96 | Temp 98.0°F | Resp 20

## 2015-04-15 DIAGNOSIS — Z452 Encounter for adjustment and management of vascular access device: Secondary | ICD-10-CM

## 2015-04-15 DIAGNOSIS — C172 Malignant neoplasm of ileum: Secondary | ICD-10-CM

## 2015-04-15 DIAGNOSIS — C184 Malignant neoplasm of transverse colon: Secondary | ICD-10-CM

## 2015-04-15 MED ORDER — SODIUM CHLORIDE 0.9% FLUSH
10.0000 mL | INTRAVENOUS | Status: DC | PRN
  Administered 2015-04-15: 10 mL
  Filled 2015-04-15: qty 10

## 2015-04-15 MED ORDER — HEPARIN SOD (PORK) LOCK FLUSH 100 UNIT/ML IV SOLN
500.0000 [IU] | Freq: Once | INTRAVENOUS | Status: AC | PRN
  Administered 2015-04-15: 500 [IU]

## 2015-04-15 MED ORDER — HEPARIN SOD (PORK) LOCK FLUSH 100 UNIT/ML IV SOLN
INTRAVENOUS | Status: AC
  Filled 2015-04-15: qty 5

## 2015-04-15 NOTE — Patient Instructions (Signed)
[place] at [facility] Discharge Instructions  RECOMMENDATIONS MADE BY THE CONSULTANT AND ANY TEST RESULTS WILL BE SENT TO YOUR REFERRING PHYSICIAN.  Pump removal and port flush today. Return as scheduled for chemotherapy. Return as scheduled for office visit.  Call the clinic with any question or concerns.  Thank you for choosing [facility] at [facility] to provide your oncology and hematology care.  To afford each patient quality time with our provider, please arrive at least 15 minutes before your scheduled appointment time.   Beginning January 23rd 2017 lab work for the [facility] will be done in the  Main lab at [facility] on 1st floor. If you have a lab appointment with the [facility] please come in thru the  Main Entrance and check in at the main information desk  You need to re-schedule your appointment should you arrive 10 or more minutes late.  We strive to give you quality time with our providers, and arriving late affects you and other patients whose appointments are after yours.  Also, if you no show three or more times for appointments you may be dismissed from the clinic at the providers discretion.     Again, thank you for choosing [facility].  Our hope is that these requests will decrease the amount of time that you wait before being seen by our physicians.       _____________________________________________________________  Should you have questions after your visit to [facility], please contact our office at [phone] between the hours of 8:30 a.m. and 4:30 p.m.  Voicemails left after 4:30 p.m. will not be returned until the following business day.  For prescription refill requests, have your pharmacy contact our office.         Resources For Cancer Patients and their Caregivers ? American Cancer Society: Can assist with transportation, wigs, general needs, runs Look Good Feel  Better.        ([phone] ? Cancer Care: Provides financial assistance, online support groups, medication/co-pay assistance.  1-800-813-HOPE (551)635-1363) ? [facility] Assists [place] cancer patients and their families through emotional , educational and financial support.  [phone] ? [place] DSS Where to apply for food stamps, Medicaid and utility assistance. 367-847-6652 ? RCATS: Transportation to medical appointments. 6282994050 ? Social Security Administration: May apply for disability if have a Stage IV cancer. [phone] [phone] ? [employer], Disability and Transit Services: Assists with nutrition, care and transit needs. [phone]

## 2015-04-15 NOTE — Progress Notes (Signed)
1200:  Erik [name] presents to have home infusion pump d/c'd and for port-a-cath flush/deaccess. Good blood return present. Portacath flushed with NS and 500U/8ml Heparin, and needle removed intact.  Procedure tolerated well and without incident.

## 2015-04-16 ENCOUNTER — Ambulatory Visit (INDEPENDENT_AMBULATORY_CARE_PROVIDER_SITE_OTHER): Payer: Self-pay | Admitting: Neurology

## 2015-04-16 ENCOUNTER — Encounter: Payer: Self-pay | Admitting: Neurology

## 2015-04-16 VITALS — BP 138/70 | HR 68 | Ht 74.0 in | Wt 227.6 lb

## 2015-04-16 DIAGNOSIS — I951 Orthostatic hypotension: Secondary | ICD-10-CM

## 2015-04-16 DIAGNOSIS — G44219 Episodic tension-type headache, not intractable: Secondary | ICD-10-CM

## 2015-04-16 NOTE — Patient Instructions (Signed)
Continue amitriptyline 25mg  at bedtime Follow up in 8 months

## 2015-04-16 NOTE — Progress Notes (Signed)
NEUROLOGY FOLLOW UP OFFICE NOTE  [name]  HISTORY OF PRESENT ILLNESS: Erik [name] is a 60 year old man with history of HTN, hyperlipidemia, adenocarcinoma of the colon,  PTSD, alcohol abuse, depression and who presents for follow-up regarding tension type headache, syncope, gait unsteadiness and history of SDH.   Recent history obtained by patient and oncology notes.  Imaging of CTA of abdomen reviewed.  UPDATE: He was diagnosed with colon cancer.  CT abdomen revealed lesion in the transverse colon.  He had been in the hospital for awhile.  Yesterday, he had his third treatment of chemotherapy. Headaches have been well controlled with amitriptyline 25mg  at bedtime He still feels lightheaded when he is up, but hasn't blacked out.  HISTORY: In 2014, he was on the porch with a friend.  He stood up and felt lightheaded, and then passed out.  When he woke up, the paramedics were there.  He did not have any convulsions, tongue laceration or incontinence.  He elected not to go to the hospital.  Later that night, he began to feel shaky with cold sweat.  He presented a couple of days later to an outside ED, where CT of brain revealed small amount of subarachnoid blood at the high left parietal lobe, as well as small 3 mm adjacent subdural hematoma without mass effect.  There was also a non-displaced left parietal skull fracture.  He was subsequently transferred to [facility].  Serum etoh level was 217.  He was evaluated by neurosurgery.  Follow up CT scan was stable, so no surgical intervention was indicated.  EEG from 08/27/12 was normal.  2D Echo revealed EF 55-60% with no significant abnormalities.  Black outs were thought to be secondary to postural hypotension, rather than seizure.    He has periodic dizzy spells of lightheadedness and feeling of hot flashes or chills.  Sometimes they are associated with passing out for a few seconds.  He has no convulsions, tongue biting, urinary  incontinence or postictal confusion.  It usually occurs soon after getting up.  He has fluctuation in blood pressure.  At one point, he was orthostatic but he sometimes has elevated blood pressure.  He had a cardiac monitor which was unremarkable.    He has history of alcohol abuse but only moderately drinks from time to time.   PAST MEDICAL HISTORY: Past Medical History  Diagnosis Date  . Head trauma 2001    closed head injury; coma for 4 weeks  . Depression   . Abnormal stress echocardiogram   . Hypercholesterolemia   . Hypertension   . Alcohol abuse     Heavy Use up until 2010  . GERD (gastroesophageal reflux disease)   . PTSD (post-traumatic stress disorder)   . Anxiety   . Adenocarcinoma of transverse colon ([place]) 02/10/2015    MEDICATIONS: Current Outpatient Prescriptions on File Prior to Visit  Medication Sig Dispense Refill  . albuterol (PROVENTIL HFA;VENTOLIN HFA) 108 (90 BASE) MCG/ACT inhaler Inhale 2 puffs into the lungs every 6 (six) hours as needed for wheezing or shortness of breath. 1 Inhaler 1  . amitriptyline (ELAVIL) 25 MG tablet Take 1 tablet (25 mg total) by mouth at bedtime. 30 tablet 2  . atorvastatin (LIPITOR) 20 MG tablet Take 1 tablet (20 mg total) by mouth daily. 90 tablet 1  . dexlansoprazole (DEXILANT) 60 MG capsule Take 1 capsule (60 mg total) by mouth daily. 90 capsule 1  . fluorouracil CALGB 91478 in sodium chloride  0.9 % 150 mL Inject into the vein. Reported on 03/22/2015    . FLUoxetine (PROZAC) 20 MG capsule Take 20 mg by mouth 2 (two) times daily.     Marland Kitchen HYDROcodone-acetaminophen (NORCO) 5-325 MG tablet Take 1 tablet by mouth every 6 (six) hours as needed for moderate pain. 60 tablet 0  . leucovorin in dextrose 5 % 250 mL Inject into the vein every 14 (fourteen) days. Reported on 03/22/2015    . lidocaine-prilocaine (EMLA) cream Apply a quarter size amount to port site 1 hour prior to chemo. Do not rub in. Cover with plastic wrap. 30 g 3  . OXALIPLATIN IV  Inject into the vein every 14 (fourteen) days. Reported on 03/22/2015    . rosuvastatin (CRESTOR) 40 MG tablet Take 1 tablet (40 mg total) by mouth daily. 90 tablet 1  . sildenafil (VIAGRA) 100 MG tablet Take 0.5-1 tablets (50-100 mg total) by mouth daily as needed for erectile dysfunction. 10 tablet 1   No current facility-administered medications on file prior to visit.    ALLERGIES: No Known Allergies  FAMILY HISTORY: Family History  Problem Relation Age of Onset  . Ataxia Neg Hx   . Chorea Neg Hx   . Dementia Neg Hx   . Mental retardation Neg Hx   . Migraines Neg Hx   . Multiple sclerosis Neg Hx   . Neurofibromatosis Neg Hx   . Neuropathy Neg Hx   . Parkinsonism Neg Hx   . Seizures Neg Hx   . Stroke Neg Hx   . Colon cancer Neg Hx   . Pulmonary embolism Mother   . Breast cancer Mother   . Heart disease Father   . Cancer Father 62    Leukemia  . Cancer Mother   . Cancer Paternal Grandfather     Lung    SOCIAL HISTORY: Social History   Social History  . Marital Status: Divorced    Spouse Name: N/A  . Number of Children: N/A  . Years of Education: N/A   Occupational History  . Full time    Social History Main Topics  . Smoking status: Former Smoker -- 0.25 packs/day    Types: Cigarettes    Quit date: 02/10/2015  . Smokeless tobacco: Never Used     Comment: patient is aware he needs to quit  . Alcohol Use: 0.0 oz/week    0 Standard drinks or equivalent per week     Comment: beer occ, history of ETOH abuse in remote past.   . Drug Use: Yes    Special: Marijuana     Comment: marijuana last 2012  . Sexual Activity:    Partners: Female   Other Topics Concern  . Not on file   Social History Narrative   Single    REVIEW OF SYSTEMS: Constitutional: fatigue Eyes: No visual changes, double vision, eye pain Ear, nose and throat: No hearing loss, ear pain, nasal congestion, sore throat Cardiovascular: No chest pain, palpitations Respiratory:  No shortness  of breath at rest or with exertion, wheezes GastrointestinaI: No nausea, vomiting, diarrhea, abdominal pain, fecal incontinence Genitourinary:  No dysuria, urinary retention or frequency Musculoskeletal:  No neck pain, back pain Integumentary: No rash, pruritus, skin lesions Neurological: as above Psychiatric: anxiety Endocrine: No palpitations Hematologic/Lymphatic:  No purpura, petechiae. Allergic/Immunologic: no itchy/runny eyes, nasal congestion, recent allergic reactions, rashes  PHYSICAL EXAM: Filed Vitals:   04/16/15 1344  BP: 138/70  Pulse: 68   General: No acute distress.  Patient  appears well-groomed.  normal body habitus. Head:  Normocephalic/atraumatic Eyes:  Fundoscopic exam unremarkable without vessel changes, exudates, hemorrhages or papilledema. Neck: supple, no paraspinal tenderness, full range of motion Heart:  Regular rate and rhythm Lungs:  Clear to auscultation bilaterally Back: No paraspinal tenderness Neurological Exam: alert and oriented to person, place, and time. Attention span and concentration intact, recent and remote memory intact, fund of knowledge intact.  Speech fluent and not dysarthric, language intact.  CN II-XII intact. Fundi not visualized.  Bulk and tone normal, muscle strength 5/5 throughout.  Deep tendon reflexes 2+ throughout, toes downgoing.  Reduced temperature and vibration sensation in toes.  Finger to nose with some mild intention tremor, heel to shin without dysmetria.  Gait mildly unsteady.   IMPRESSION: Tension-type headaches, improved Syncope/nearsyncope, orthostatic.  Possibly related to a dysautonomia secondary to head trauma or history of alcoholism  PLAN: Amitriptyline 25mg  at bedtime for headache Follow up with Dr. Harl Bowie (cardiologist) regarding orthostasis Follow up with me in 8 months.  Metta Clines, DO  CC:  [name], PA-C

## 2015-04-20 ENCOUNTER — Encounter (HOSPITAL_BASED_OUTPATIENT_CLINIC_OR_DEPARTMENT_OTHER): Payer: Medicaid Other

## 2015-04-20 ENCOUNTER — Encounter (HOSPITAL_COMMUNITY): Payer: Self-pay

## 2015-04-20 ENCOUNTER — Other Ambulatory Visit (HOSPITAL_COMMUNITY): Payer: Self-pay | Admitting: *Deleted

## 2015-04-20 DIAGNOSIS — E538 Deficiency of other specified B group vitamins: Secondary | ICD-10-CM

## 2015-04-20 DIAGNOSIS — C184 Malignant neoplasm of transverse colon: Secondary | ICD-10-CM

## 2015-04-20 MED ORDER — CYANOCOBALAMIN 1000 MCG/ML IJ SOLN
INTRAMUSCULAR | Status: AC
  Filled 2015-04-20: qty 1

## 2015-04-20 MED ORDER — CYANOCOBALAMIN 1000 MCG/ML IJ SOLN
1000.0000 ug | INTRAMUSCULAR | Status: AC
  Administered 2015-04-20: 1000 ug via INTRAMUSCULAR

## 2015-04-20 MED ORDER — HYDROCODONE-ACETAMINOPHEN 5-325 MG PO TABS: 1.0000 | ORAL_TABLET | Freq: Four times a day (QID) | ORAL | Status: DC | PRN

## 2015-04-20 MED FILL — AMITRIPTYLINE HCL 25 MG TAB: 25 | 30 days supply | Qty: 30 | Fill #0 | Status: TO

## 2015-04-20 MED FILL — HYDROCODON-APAP 5-325: 5-325 | 15 days supply | Qty: 60 | Fill #0

## 2015-04-20 NOTE — Progress Notes (Signed)
..  Erik [name] presents today for injection per the provider's orders.  Vitamin b12 administration without incident; see MAR for injection details.  Patient tolerated procedure well and without incident.  Patient c/o a stabbing like pain along incision line accompanied by sensitivity to his clothes touching incision line. He has talked to his surgeon about this in [place] who he said explained to him that he had very extensive surgery and that healing will take awhile. Discussed symptoms with T. Kefalas PA-C today. Explained to patient that this can be nerve regeneration. He does not have fever, redness or swelling. He knows to let us know if this pain changes or becomes more severe.

## 2015-04-20 NOTE — Patient Instructions (Signed)
..  [place] at [facility] Discharge Instructions  RECOMMENDATIONS MADE BY THE CONSULTANT AND ANY TEST RESULTS WILL BE SENT TO YOUR REFERRING PHYSICIAN.  Vitamin B12 injection today Call us if your abdominal pain becomes more severe or if you develop fever, swelling, or redness. Pain prescription should be here for pick up 3/15 after 12 noon  Thank you for choosing [place] at [facility] to provide your oncology and hematology care.  To afford each patient quality time with our provider, please arrive at least 15 minutes before your scheduled appointment time.   Beginning January 23rd 2017 lab work for the [facility] will be done in the  Main lab at [facility] on 1st floor. If you have a lab appointment with the [facility] please come in thru the  Main Entrance and check in at the main information desk  You need to re-schedule your appointment should you arrive 10 or more minutes late.  We strive to give you quality time with our providers, and arriving late affects you and other patients whose appointments are after yours.  Also, if you no show three or more times for appointments you may be dismissed from the clinic at the providers discretion.     Again, thank you for choosing [facility].  Our hope is that these requests will decrease the amount of time that you wait before being seen by our physicians.       _____________________________________________________________  Should you have questions after your visit to [facility], please contact our office at [phone] between the hours of 8:30 a.m. and 4:30 p.m.  Voicemails left after 4:30 p.m. will not be returned until the following business day.  For prescription refill requests, have your pharmacy contact our office.         Resources For Cancer Patients and their Caregivers ? American Cancer Society: Can assist with transportation, wigs,  general needs, runs Look Good Feel Better.        203-734-6987 ? Cancer Care: Provides financial assistance, online support groups, medication/co-pay assistance.  1-800-813-HOPE (762) 883-6201) ? [facility] Assists [place] cancer patients and their families through emotional , educational and financial support.  ([phone] ? [place] DSS Where to apply for food stamps, Medicaid and utility assistance. 254-280-8268 ? RCATS: Transportation to medical appointments. 450-807-6585 ? Social Security Administration: May apply for disability if have a Stage IV cancer. [phone] ? [employer], Disability and Transit Services: Assists with nutrition, care and transit needs. [phone]

## 2015-04-25 NOTE — Assessment & Plan Note (Addendum)
Stage IIIC [facility]) adenocarcinoma of transverse colon, S/P definitive surgery with a right extended hemicolectomy by Dr. [name].  Now on systemic adjuvant therapy consisting of FOLFOX.  Oncology history is up to date.  Pretreatment labs today: CBC with differential and complete in about panel.  He continues to have postoperative discomfort. I suspect it is musculoskeletal and neuropathic in nature given his signs and symptoms. No acute findings suspicious for infection. Afebrile. No role for imaging or further workup at this time.  Return in 2 week with pre-treatment labs: CBC diff, CMET  Return in 2 weeks for follow-up and next cycle of FOLFOX.

## 2015-04-25 NOTE — Progress Notes (Signed)
Erik Dryer, PA-C [place] [address]  Adenocarcinoma of transverse colon [facility])  B12 deficiency  CURRENT THERAPY: FOLFOX beginning on 03/30/2015  INTERVAL HISTORY: Erik [name] 60 y.o. male returns for followup of Stage IIIC [facility]) adenocarcinoma of transverse colon.    Adenocarcinoma of transverse colon ([place])   01/12/2015 Pathologic Stage Colon, biopsy, distal transverse - TUBULOVILLOUS ADENOMA WITH HIGH GRADE DYSPLASIA.   01/12/2015 Procedure Colonoscopy by Dr. Oneida Alar.   01/12/2015 Tumor Marker CEA: 6.6 (H)    01/18/2015 Imaging CT abd/pelvis- Apple-core lesion identified in the mid transverse colon without obstruction. No evidence for lymphadenopathy in the gastrohepatic ligament or omentum.  Stable 8 mm hypo attenuating lesion in the left liver, likely a cyst.   02/10/2015 Initial Diagnosis Adenocarcinoma of transverse colon ([place])   02/12/2015 Definitive Surgery [name], Extended right hemicolectomy    02/12/2015 Pathology Results Mucinous adenocarcinoma with penetration of visceral peritoneum, 4/19 lymph nodes for metasattic disease, negative resection margins, with LVI and perineural invasion   03/30/2015 -  Chemotherapy FOLFOX    I personally reviewed and went over laboratory results with the patient.  The results are noted within this dictation.  Labs are being updated today.  Patient states of tolerating chemotherapy well. He denies any persistent numbness/tingling of fingertips or toetips. He does admit to peripheral neuropathy a few days following his last cycle of chemotherapy lasting approximate 48 hours. It is since resolved. He also notices cold intolerance as expected with oxaliplatin.   He continues to complain of abdominal pain. He remains afebrile. He denies any shaking chills. He notes that his pain increases with increased intra-abdominal pressure including coughing and straining for bowel movements. He also notes discomfort  with particular positional changes. Additionally, he notes some burning sensation when his clothing rubs against his incision site on the lower third. I suspect this is neuropathic in nature. I suspect is a for mentioned symptoms are muscular skeletal in nature. He's been in contact with the general surgeon. No role for imaging at this point in time.  Otherwise he denies any complaints and is tolerating chemotherapy well.  Past Medical History  Diagnosis Date  . Head trauma 2001    closed head injury; coma for 4 weeks  . Depression   . Abnormal stress echocardiogram   . Hypercholesterolemia   . Hypertension   . Alcohol abuse     Heavy Use up until 2010  . GERD (gastroesophageal reflux disease)   . PTSD (post-traumatic stress disorder)   . Anxiety   . Adenocarcinoma of transverse colon ([place]) 02/10/2015    has HYPERCHOLESTEROLEMIA  IIA; Hyperlipidemia; ALCOHOL ABUSE; TOBACCO USER; DEPRESSION; CAD, NATIVE VESSEL; ING HERN W/GANGREN RECUR UNILAT/UNSPEC ING HERN; ABNORMAL STRESS ELECTROCARDIOGRAM; ABNORMAL CV (STRESS) TEST; HEAD TRAUMA; Bilateral lower extremity edema; SAH (subarachnoid hemorrhage) ([place]); SDH (subdural hematoma) ([place]); Orthostatic syncope; Bronchitis; Episodic tension-type headache, not intractable; Syncope and collapse; History of alcohol abuse; GERD (gastroesophageal reflux disease); History of colonic polyps; Tubular adenoma of colon; Adenocarcinoma of transverse colon ([place]); and B12 deficiency on his problem list.     has No Known Allergies.  Current Outpatient Prescriptions on File Prior to Visit  Medication Sig Dispense Refill  . albuterol (PROVENTIL HFA;VENTOLIN HFA) 108 (90 BASE) MCG/ACT inhaler Inhale 2 puffs into the lungs every 6 (six) hours as needed for wheezing or shortness of breath. 1 Inhaler 1  . amitriptyline (ELAVIL) 25 MG tablet Take 1 tablet (25 mg total)  by mouth at bedtime. 30 tablet 2  . atorvastatin (LIPITOR) 20 MG tablet Take 1 tablet (20 mg total) by  mouth daily. 90 tablet 1  . dexlansoprazole (DEXILANT) 60 MG capsule Take 1 capsule (60 mg total) by mouth daily. 90 capsule 1  . fluorouracil CALGB 16109 in sodium chloride 0.9 % 150 mL Inject into the vein. Reported on 03/22/2015    . FLUoxetine (PROZAC) 20 MG capsule Take 20 mg by mouth 2 (two) times daily.     Marland Kitchen HYDROcodone-acetaminophen (NORCO) 5-325 MG tablet Take 1 tablet by mouth every 6 (six) hours as needed for moderate pain. 60 tablet 0  . leucovorin in dextrose 5 % 250 mL Inject into the vein every 14 (fourteen) days. Reported on 03/22/2015    . lidocaine-prilocaine (EMLA) cream Apply a quarter size amount to port site 1 hour prior to chemo. Do not rub in. Cover with plastic wrap. 30 g 3  . OXALIPLATIN IV Inject into the vein every 14 (fourteen) days. Reported on 03/22/2015    . rosuvastatin (CRESTOR) 40 MG tablet Take 1 tablet (40 mg total) by mouth daily. 90 tablet 1  . sildenafil (VIAGRA) 100 MG tablet Take 0.5-1 tablets (50-100 mg total) by mouth daily as needed for erectile dysfunction. 10 tablet 1   No current facility-administered medications on file prior to visit.    Past Surgical History  Procedure Laterality Date  . Kidney surgery      >30 years ago  . Cardiac cath    . Craniotomy  2001  . Head injury surgery    . Colonoscopy  2011    Dr. Oneida Alar: multiple adenomas and hyperplastic polyps  . Colonoscopy with propofol N/A 01/12/2015    Procedure: COLONOSCOPY WITH PROPOFOL;  Surgeon: Danie Binder, MD;  [facility];  Service: Endoscopy;  Laterality: N/A;  1030  . Esophagogastroduodenoscopy (egd) with propofol N/A 01/12/2015    Procedure: ESOPHAGOGASTRODUODENOSCOPY (EGD) WITH PROPOFOL;  Surgeon: Danie Binder, MD;  Location[facility];  Service: Endoscopy;  Laterality: N/A;  . Hernia repair Right 2012    Inguinal- [place] Na  . Laparoscopic right hemi colectomy Left 02/10/2015    Procedure: LAPAROSCOPIC THEN OPEN RIGHT HEMI COLECTOMY;  Surgeon: [name], MD;  Location: [facility] ORS;  Service: General;  Laterality: Left;  . Portacath placement N/A 03/24/2015    Procedure: INSERTION PORT-A-CATH;  Surgeon: Jules Husbands, MD;  Location: [facility] ORS;  Service: General;  Laterality: N/A;    Denies any headaches, dizziness, double vision, fevers, chills, night sweats, nausea, vomiting, diarrhea, constipation, chest pain, heart palpitations, shortness of breath, blood in stool, black tarry stool, urinary pain, urinary burning, urinary frequency, hematuria.   PHYSICAL EXAMINATION  ECOG PERFORMANCE STATUS: 0 - Asymptomatic  Filed Vitals:   04/27/15 0800  BP: 133/78  Pulse: 114  Temp: 98.2 F (36.8 C)  Resp: 20    GENERAL:alert, no distress, well nourished, well developed, comfortable, cooperative and smiling, Unaccompanied SKIN: skin color, texture, turgor are normal, no rashes or significant lesions HEAD: Normocephalic, No masses, lesions, tenderness or abnormalities EYES: normal, EOMI, Conjunctiva are pink and non-injected EARS: External ears normal OROPHARYNX:lips, buccal mucosa, and tongue normal and mucous membranes are moist  NECK: supple, trachea midline LYMPH:  not examined BREAST:not examined LUNGS: clear to auscultation without any wheezes, rales, or rhonchi. HEART: regular rate & rhythm without any murmur, rub, or gallop. ABDOMEN:abdomen soft and normal bowel sounds, midline incision healed nicely without any erythema  or discharge. No open areas. No tenderness to palpation. BACK: Back symmetric, no curvature. EXTREMITIES:less then 2 second capillary refill, no joint deformities, effusion, or inflammation, no cyanosis  NEURO: alert & oriented x 3 with fluent speech, no focal motor/sensory deficits, gait normal   LABORATORY DATA: CBC    Component Value Date/Time   WBC 5.5 04/13/2015 0932   RBC 4.44 04/13/2015 0932   HGB 13.4 04/13/2015 0932   HCT 40.5 04/13/2015 0932   PLT 199 04/13/2015 0932   MCV 91.2 04/13/2015 0932     MCH 30.2 04/13/2015 0932   MCHC 33.1 04/13/2015 0932   RDW 13.6 04/13/2015 0932   LYMPHSABS 1.4 04/13/2015 0932   MONOABS 0.7 04/13/2015 0932   EOSABS 0.1 04/13/2015 0932   BASOSABS 0.0 04/13/2015 0932      Chemistry      Component Value Date/Time   NA 139 04/13/2015 0932   K 3.6 04/13/2015 0932   CL 104 04/13/2015 0932   CO2 30 04/13/2015 0932   BUN 8 04/13/2015 0932   CREATININE 0.73 04/13/2015 0932   CREATININE 0.86 12/16/2014 0844      Component Value Date/Time   CALCIUM 8.6* 04/13/2015 0932   ALKPHOS 64 04/13/2015 0932   AST 20 04/13/2015 0932   ALT 18 04/13/2015 0932   BILITOT 0.4 04/13/2015 0932        PENDING LABS:   RADIOGRAPHIC STUDIES:  No results found.   PATHOLOGY:    ASSESSMENT AND PLAN:  Adenocarcinoma of transverse colon ([place]) Stage IIIC [id number]) adenocarcinoma of transverse colon, S/P definitive surgery with a right extended hemicolectomy by Dr. [name].  Now on systemic adjuvant therapy consisting of FOLFOX.  Oncology history is up to date.  Pretreatment labs today: CBC with differential and complete in about panel.  He continues to have postoperative discomfort. I suspect it is musculoskeletal and neuropathic in nature given his signs and symptoms. No acute findings suspicious for infection. Afebrile. No role for imaging or further workup at this time.  Return in 2 week with pre-treatment labs: CBC diff, CMET  Return in 2 weeks for follow-up and next cycle of FOLFOX.    B12 deficiency B12 deficiency, without antibody testing.  Started B12 injections weekly on 03/30/2015.  B12 injection today.  This will be his 4/4 injection weekly.  Supportive therapy plan built for monthly B12 following today.   THERAPY PLAN:  Adjuvant systemic chemotherapy with FOLFOX beginning on 03/30/2015.  All questions were answered. The patient knows to call the clinic with any problems, questions or concerns. We can certainly see the patient  much sooner if necessary.  Patient and plan discussed with Dr. Ancil Linsey and she is in agreement with the aforementioned.   This note is electronically signed by: Doy Mince 04/27/2015 8:53 AM

## 2015-04-25 NOTE — Assessment & Plan Note (Signed)
B12 deficiency, without antibody testing.  Started B12 injections weekly on 03/30/2015.  B12 injection today.  This will be his 4/4 injection weekly.  Supportive therapy plan built for monthly B12 following today.

## 2015-04-27 ENCOUNTER — Encounter (HOSPITAL_COMMUNITY): Payer: Medicaid Other

## 2015-04-27 ENCOUNTER — Encounter (HOSPITAL_BASED_OUTPATIENT_CLINIC_OR_DEPARTMENT_OTHER): Payer: Medicaid Other

## 2015-04-27 ENCOUNTER — Encounter (HOSPITAL_COMMUNITY): Payer: Self-pay | Admitting: Oncology

## 2015-04-27 ENCOUNTER — Ambulatory Visit (HOSPITAL_COMMUNITY): Payer: Self-pay | Admitting: Hematology & Oncology

## 2015-04-27 ENCOUNTER — Encounter (HOSPITAL_BASED_OUTPATIENT_CLINIC_OR_DEPARTMENT_OTHER): Payer: Medicaid Other | Admitting: Oncology

## 2015-04-27 VITALS — BP 128/72 | HR 78 | Temp 98.2°F | Resp 18

## 2015-04-27 VITALS — BP 133/78 | HR 114 | Temp 98.2°F | Resp 20 | Wt 226.3 lb

## 2015-04-27 DIAGNOSIS — C184 Malignant neoplasm of transverse colon: Secondary | ICD-10-CM

## 2015-04-27 DIAGNOSIS — E538 Deficiency of other specified B group vitamins: Secondary | ICD-10-CM

## 2015-04-27 DIAGNOSIS — E876 Hypokalemia: Secondary | ICD-10-CM

## 2015-04-27 DIAGNOSIS — R109 Unspecified abdominal pain: Secondary | ICD-10-CM

## 2015-04-27 DIAGNOSIS — Z5111 Encounter for antineoplastic chemotherapy: Secondary | ICD-10-CM

## 2015-04-27 LAB — CBC WITH DIFFERENTIAL/PLATELET
BASOS ABS: 0 10*3/uL (ref 0.0–0.1)
BASOS PCT: 1 %
Eosinophils Absolute: 0.1 10*3/uL (ref 0.0–0.7)
Eosinophils Relative: 3 %
HEMATOCRIT: 37.8 % — AB (ref 39.0–52.0)
Hemoglobin: 12.6 g/dL — ABNORMAL LOW (ref 13.0–17.0)
LYMPHS PCT: 26 %
Lymphs Abs: 1.2 10*3/uL (ref 0.7–4.0)
MCH: 29.9 pg (ref 26.0–34.0)
MCHC: 33.3 g/dL (ref 30.0–36.0)
MCV: 89.6 fL (ref 78.0–100.0)
MONO ABS: 0.9 10*3/uL (ref 0.1–1.0)
Monocytes Relative: 19 %
NEUTROS ABS: 2.4 10*3/uL (ref 1.7–7.7)
Neutrophils Relative %: 51 %
PLATELETS: 182 10*3/uL (ref 150–400)
RBC: 4.22 MIL/uL (ref 4.22–5.81)
RDW: 13.7 % (ref 11.5–15.5)
WBC: 4.6 10*3/uL (ref 4.0–10.5)

## 2015-04-27 LAB — COMPREHENSIVE METABOLIC PANEL
ALK PHOS: 54 U/L (ref 38–126)
ALT: 12 U/L — ABNORMAL LOW (ref 17–63)
ANION GAP: 8 (ref 5–15)
AST: 17 U/L (ref 15–41)
Albumin: 3.5 g/dL (ref 3.5–5.0)
BILIRUBIN TOTAL: 0.4 mg/dL (ref 0.3–1.2)
BUN: 12 mg/dL (ref 6–20)
CALCIUM: 9.5 mg/dL (ref 8.9–10.3)
CO2: 31 mmol/L (ref 22–32)
Chloride: 103 mmol/L (ref 101–111)
Creatinine, Ser: 0.66 mg/dL (ref 0.61–1.24)
GLUCOSE: 115 mg/dL — AB (ref 65–99)
POTASSIUM: 3.2 mmol/L — AB (ref 3.5–5.1)
Sodium: 142 mmol/L (ref 135–145)
TOTAL PROTEIN: 6.9 g/dL (ref 6.5–8.1)

## 2015-04-27 MED ORDER — CYANOCOBALAMIN 1000 MCG/ML IJ SOLN
INTRAMUSCULAR | Status: AC
  Filled 2015-04-27: qty 1

## 2015-04-27 MED ORDER — CYANOCOBALAMIN 1000 MCG/ML IJ SOLN
1000.0000 ug | INTRAMUSCULAR | Status: AC
  Administered 2015-04-27: 1000 ug via INTRAMUSCULAR

## 2015-04-27 MED ORDER — POTASSIUM CHLORIDE CRYS ER 20 MEQ PO TBCR: 20.0000 meq | EXTENDED_RELEASE_TABLET | Freq: Every day | ORAL | Status: DC

## 2015-04-27 MED ORDER — SODIUM CHLORIDE 0.9 % IV SOLN
10.0000 mg | Freq: Once | INTRAVENOUS | Status: AC
  Administered 2015-04-27: 10 mg via INTRAVENOUS
  Filled 2015-04-27: qty 1

## 2015-04-27 MED ORDER — DEXTROSE 5 % IV SOLN
Freq: Once | INTRAVENOUS | Status: AC
  Administered 2015-04-27: 11:00:00 via INTRAVENOUS

## 2015-04-27 MED ORDER — SODIUM CHLORIDE 0.9 % IV SOLN
2400.0000 mg/m2 | INTRAVENOUS | Status: DC
  Administered 2015-04-27: 5450 mg via INTRAVENOUS
  Filled 2015-04-27: qty 100

## 2015-04-27 MED ORDER — POTASSIUM CHLORIDE CRYS ER 20 MEQ PO TBCR
40.0000 meq | EXTENDED_RELEASE_TABLET | Freq: Once | ORAL | Status: AC
  Administered 2015-04-27: 40 meq via ORAL
  Filled 2015-04-27: qty 2

## 2015-04-27 MED ORDER — FLUOROURACIL CHEMO INJECTION 2.5 GM/50ML
400.0000 mg/m2 | Freq: Once | INTRAVENOUS | Status: AC
  Administered 2015-04-27: 900 mg via INTRAVENOUS
  Filled 2015-04-27: qty 18

## 2015-04-27 MED ORDER — PALONOSETRON HCL INJECTION 0.25 MG/5ML
0.2500 mg | Freq: Once | INTRAVENOUS | Status: AC
  Administered 2015-04-27: 0.25 mg via INTRAVENOUS
  Filled 2015-04-27: qty 5

## 2015-04-27 MED ORDER — SODIUM CHLORIDE 0.9% FLUSH: 10.0000 mL | INTRAVENOUS | Status: DC | PRN

## 2015-04-27 MED ORDER — DEXTROSE 5 % IV SOLN
85.0000 mg/m2 | Freq: Once | INTRAVENOUS | Status: AC
  Administered 2015-04-27: 195 mg via INTRAVENOUS
  Filled 2015-04-27: qty 39

## 2015-04-27 MED ORDER — LEUCOVORIN CALCIUM INJECTION 350 MG
400.0000 mg/m2 | Freq: Once | INTRAVENOUS | Status: AC
  Administered 2015-04-27: 912 mg via INTRAVENOUS
  Filled 2015-04-27: qty 45.6

## 2015-04-27 NOTE — Progress Notes (Signed)
Patient tolerated infusion well.  VSS.   

## 2015-04-27 NOTE — Progress Notes (Signed)
Please see other encounter for documentation.   

## 2015-04-27 NOTE — Patient Instructions (Signed)
[facility] Discharge Instructions for Patients Receiving Chemotherapy   Beginning January 23rd 2017 lab work for the Cancer Center will be done in the  Main lab at [facility] on 1st floor. If you have a lab appointment with the Cancer Center please come in thru the  Main Entrance and check in at the main information desk   Today you received the following chemotherapy agents: oxaliplatin, leucovorin, and fluorouracil.     If you develop nausea and vomiting, or diarrhea that is not controlled by your medication, call the clinic.  The clinic phone number is [phone]. Office hours are Monday-Friday 8:30am-5:00pm.  BELOW ARE SYMPTOMS THAT SHOULD BE REPORTED IMMEDIATELY:  *FEVER GREATER THAN 101.0 F  *CHILLS WITH OR WITHOUT FEVER  NAUSEA AND VOMITING THAT IS NOT CONTROLLED WITH YOUR NAUSEA MEDICATION  *UNUSUAL SHORTNESS OF BREATH  *UNUSUAL BRUISING OR BLEEDING  TENDERNESS IN MOUTH AND THROAT WITH OR WITHOUT PRESENCE OF ULCERS  *URINARY PROBLEMS  *BOWEL PROBLEMS  UNUSUAL RASH Items with * indicate a potential emergency and should be followed up as soon as possible. If you have an emergency after office hours please contact your primary care physician or go to the nearest emergency department.  Please call the clinic during office hours if you have any questions or concerns.   You may also contact the Patient Navigator at [phone] should you have any questions or need assistance in obtaining follow up care.      Resources For Cancer Patients and their Caregivers ? American Cancer Society: Can assist with transportation, wigs, general needs, runs Look Good Feel Better.        1-888-227-6333 ? Cancer Care: Provides financial assistance, online support groups, medication/co-pay assistance.  1-800-813-HOPE (4673) ? [facility] Assists [place] cancer patients and their families through emotional , educational and  financial support.  336-427-4357 ? [place] DSS Where to apply for food stamps, Medicaid and utility assistance. 336-342-1394 ? RCATS: Transportation to medical appointments. 336-347-2287 ? Social Security Administration: May apply for disability if have a Stage IV cancer. 336-342-7796 1-800-772-1213 ? [place] Aging, Disability and Transit Services: Assists with nutrition, care and transit needs. [phone]          

## 2015-04-27 NOTE — Addendum Note (Signed)
Addended by: Baird Cancer on: 04/27/2015 10:45 AM   Modules accepted: Orders

## 2015-04-27 NOTE — Patient Instructions (Signed)
[place] at [facility] Discharge Instructions  RECOMMENDATIONS MADE BY THE CONSULTANT AND ANY TEST RESULTS WILL BE SENT TO YOUR REFERRING PHYSICIAN.  Exam done and seen by [name] today B12 given today. Return in 2 weeks for follow up and chemo  Thank you for choosing [place] at [facility] to provide your oncology and hematology care.  To afford each patient quality time with our provider, please arrive at least 15 minutes before your scheduled appointment time.   Beginning January 23rd 2017 lab work for the [facility] will be done in the  Main lab at [facility] on 1st floor. If you have a lab appointment with the [facility] please come in thru the  Main Entrance and check in at the main information desk  You need to re-schedule your appointment should you arrive 10 or more minutes late.  We strive to give you quality time with our providers, and arriving late affects you and other patients whose appointments are after yours.  Also, if you no show three or more times for appointments you may be dismissed from the clinic at the providers discretion.     Again, thank you for choosing [facility].  Our hope is that these requests will decrease the amount of time that you wait before being seen by our physicians.       _____________________________________________________________  Should you have questions after your visit to [facility], please contact our office at [phone] between the hours of 8:30 a.m. and 4:30 p.m.  Voicemails left after 4:30 p.m. will not be returned until the following business day.  For prescription refill requests, have your pharmacy contact our office.         Resources For Cancer Patients and their Caregivers ? American Cancer Society: Can assist with transportation, wigs, general needs, runs Look Good Feel Better.        (408) 137-5089 ? Cancer Care: Provides  financial assistance, online support groups, medication/co-pay assistance.  1-800-813-HOPE 218-526-4094) ? [place] Assists [place] cancer patients and their families through emotional , educational and financial support.  (207)652-2285 ? [place] DSS Where to apply for food stamps, Medicaid and utility assistance. (778) 041-6293 ? RCATS: Transportation to medical appointments. 854-746-7803 ? Social Security Administration: May apply for disability if have a Stage IV cancer. [phone] (302)461-9775 ? [employer], Disability and Transit Services: Assists with nutrition, care and transit needs. [phone]

## 2015-04-29 ENCOUNTER — Encounter (HOSPITAL_BASED_OUTPATIENT_CLINIC_OR_DEPARTMENT_OTHER): Payer: Medicaid Other

## 2015-04-29 VITALS — BP 131/71 | HR 80 | Temp 98.1°F | Resp 18

## 2015-04-29 DIAGNOSIS — C184 Malignant neoplasm of transverse colon: Secondary | ICD-10-CM

## 2015-04-29 MED ORDER — HEPARIN SOD (PORK) LOCK FLUSH 100 UNIT/ML IV SOLN
500.0000 [IU] | Freq: Once | INTRAVENOUS | Status: AC | PRN
  Administered 2015-04-29: 500 [IU]

## 2015-04-29 MED ORDER — SODIUM CHLORIDE 0.9% FLUSH
10.0000 mL | INTRAVENOUS | Status: DC | PRN
  Administered 2015-04-29: 10 mL
  Filled 2015-04-29: qty 10

## 2015-04-29 MED ORDER — HEPARIN SOD (PORK) LOCK FLUSH 100 UNIT/ML IV SOLN
INTRAVENOUS | Status: AC
  Filled 2015-04-29: qty 5

## 2015-04-29 NOTE — Patient Instructions (Signed)
[place] at [facility] Discharge Instructions  RECOMMENDATIONS MADE BY THE CONSULTANT AND ANY TEST RESULTS WILL BE SENT TO YOUR REFERRING PHYSICIAN.  Continuous infusion pump disconnected and port flushed. Return as scheduled.  Thank you for choosing [place] at [facility] to provide your oncology and hematology care.  To afford each patient quality time with our provider, please arrive at least 15 minutes before your scheduled appointment time.   Beginning January 23rd 2017 lab work for the [facility] will be done in the  Main lab at [facility] on 1st floor. If you have a lab appointment with the [facility] please come in thru the  Main Entrance and check in at the main information desk  You need to re-schedule your appointment should you arrive 10 or more minutes late.  We strive to give you quality time with our providers, and arriving late affects you and other patients whose appointments are after yours.  Also, if you no show three or more times for appointments you may be dismissed from the clinic at the providers discretion.     Again, thank you for choosing [facility].  Our hope is that these requests will decrease the amount of time that you wait before being seen by our physicians.       _____________________________________________________________  Should you have questions after your visit to [facility], please contact our office at [phone] between the hours of 8:30 a.m. and 4:30 p.m.  Voicemails left after 4:30 p.m. will not be returned until the following business day.  For prescription refill requests, have your pharmacy contact our office.         Resources For Cancer Patients and their Caregivers ? American Cancer Society: Can assist with transportation, wigs, general needs, runs Look Good Feel Better.        910-500-7716 ? Cancer Care: Provides financial assistance, online  support groups, medication/co-pay assistance.  1-800-813-HOPE (313)028-2440) ? [place] Assists [place] cancer patients and their families through emotional , educational and financial support.  (716)340-2450 ? [place] DSS Where to apply for food stamps, Medicaid and utility assistance. 640-500-7825 ? RCATS: Transportation to medical appointments. 574 286 2637 ? Social Security Administration: May apply for disability if have a Stage IV cancer. ([phone] ? [employer], Disability and Transit Services: Assists with nutrition, care and transit needs. 404-101-5210

## 2015-04-29 NOTE — Progress Notes (Signed)
Tolerated chemo well. Infusion pump complete. Disconnected infusion pump, flushed port per protocol and removed needle. Patient reports cold sensitivity is more obvious with this treatment. Ambulatory on discharge home to self.

## 2015-05-11 ENCOUNTER — Encounter (HOSPITAL_BASED_OUTPATIENT_CLINIC_OR_DEPARTMENT_OTHER): Payer: Medicaid Other | Admitting: Hematology & Oncology

## 2015-05-11 ENCOUNTER — Encounter: Payer: Self-pay | Admitting: *Deleted

## 2015-05-11 ENCOUNTER — Encounter (HOSPITAL_COMMUNITY): Payer: Medicaid Other | Attending: Hematology & Oncology

## 2015-05-11 ENCOUNTER — Encounter (HOSPITAL_COMMUNITY): Payer: Self-pay | Admitting: Hematology & Oncology

## 2015-05-11 VITALS — BP 154/85 | HR 87 | Temp 98.1°F | Resp 18 | Wt 228.0 lb

## 2015-05-11 DIAGNOSIS — C184 Malignant neoplasm of transverse colon: Secondary | ICD-10-CM

## 2015-05-11 DIAGNOSIS — E538 Deficiency of other specified B group vitamins: Secondary | ICD-10-CM | POA: Diagnosis not present

## 2015-05-11 DIAGNOSIS — R911 Solitary pulmonary nodule: Secondary | ICD-10-CM | POA: Diagnosis not present

## 2015-05-11 DIAGNOSIS — Z5111 Encounter for antineoplastic chemotherapy: Secondary | ICD-10-CM

## 2015-05-11 LAB — CBC WITH DIFFERENTIAL/PLATELET
BASOS ABS: 0 10*3/uL (ref 0.0–0.1)
Basophils Relative: 1 %
Eosinophils Absolute: 0.1 10*3/uL (ref 0.0–0.7)
Eosinophils Relative: 2 %
HEMATOCRIT: 37 % — AB (ref 39.0–52.0)
HEMOGLOBIN: 12.4 g/dL — AB (ref 13.0–17.0)
LYMPHS PCT: 24 %
Lymphs Abs: 1.1 10*3/uL (ref 0.7–4.0)
MCH: 29.7 pg (ref 26.0–34.0)
MCHC: 33.5 g/dL (ref 30.0–36.0)
MCV: 88.5 fL (ref 78.0–100.0)
MONO ABS: 0.7 10*3/uL (ref 0.1–1.0)
MONOS PCT: 15 %
NEUTROS ABS: 2.8 10*3/uL (ref 1.7–7.7)
NEUTROS PCT: 58 %
Platelets: 138 10*3/uL — ABNORMAL LOW (ref 150–400)
RBC: 4.18 MIL/uL — ABNORMAL LOW (ref 4.22–5.81)
RDW: 14.4 % (ref 11.5–15.5)
WBC: 4.7 10*3/uL (ref 4.0–10.5)

## 2015-05-11 LAB — COMPREHENSIVE METABOLIC PANEL
ALBUMIN: 3.5 g/dL (ref 3.5–5.0)
ALT: 15 U/L — ABNORMAL LOW (ref 17–63)
ANION GAP: 8 (ref 5–15)
AST: 19 U/L (ref 15–41)
Alkaline Phosphatase: 57 U/L (ref 38–126)
BUN: 11 mg/dL (ref 6–20)
CO2: 28 mmol/L (ref 22–32)
Calcium: 8.7 mg/dL — ABNORMAL LOW (ref 8.9–10.3)
Chloride: 104 mmol/L (ref 101–111)
Creatinine, Ser: 0.76 mg/dL (ref 0.61–1.24)
GFR calc Af Amer: >60 mL/min (ref >60)
GFR calc non Af Amer: >60 mL/min (ref >60)
GLUCOSE: 112 mg/dL — AB (ref 65–99)
POTASSIUM: 3.7 mmol/L (ref 3.5–5.1)
SODIUM: 140 mmol/L (ref 135–145)
Total Bilirubin: 0.3 mg/dL (ref 0.3–1.2)
Total Protein: 6.7 g/dL (ref 6.5–8.1)

## 2015-05-11 MED ORDER — DEXTROSE 5 % IV SOLN
400.0000 mg/m2 | Freq: Once | INTRAVENOUS | Status: AC
  Administered 2015-05-11: 912 mg via INTRAVENOUS
  Filled 2015-05-11: qty 45.6

## 2015-05-11 MED ORDER — HEPARIN SOD (PORK) LOCK FLUSH 100 UNIT/ML IV SOLN: 500.0000 [IU] | Freq: Once | INTRAVENOUS | Status: DC | PRN

## 2015-05-11 MED ORDER — DEXTROSE 5 % IV SOLN
Freq: Once | INTRAVENOUS | Status: AC
  Administered 2015-05-11: 10:00:00 via INTRAVENOUS

## 2015-05-11 MED ORDER — SODIUM CHLORIDE 0.9% FLUSH
10.0000 mL | INTRAVENOUS | Status: DC | PRN
  Administered 2015-05-11: 10 mL
  Filled 2015-05-11: qty 10

## 2015-05-11 MED ORDER — SODIUM CHLORIDE 0.9 % IV SOLN
2400.0000 mg/m2 | INTRAVENOUS | Status: DC
  Administered 2015-05-11: 5450 mg via INTRAVENOUS
  Filled 2015-05-11: qty 100

## 2015-05-11 MED ORDER — OXALIPLATIN CHEMO INJECTION 100 MG/20ML
85.0000 mg/m2 | Freq: Once | INTRAVENOUS | Status: AC
  Administered 2015-05-11: 195 mg via INTRAVENOUS
  Filled 2015-05-11: qty 39

## 2015-05-11 MED ORDER — PALONOSETRON HCL INJECTION 0.25 MG/5ML
0.2500 mg | Freq: Once | INTRAVENOUS | Status: AC
  Administered 2015-05-11: 0.25 mg via INTRAVENOUS

## 2015-05-11 MED ORDER — PALONOSETRON HCL INJECTION 0.25 MG/5ML
INTRAVENOUS | Status: AC
  Filled 2015-05-11: qty 5

## 2015-05-11 MED ORDER — FLUOROURACIL CHEMO INJECTION 2.5 GM/50ML
400.0000 mg/m2 | Freq: Once | INTRAVENOUS | Status: AC
  Administered 2015-05-11: 900 mg via INTRAVENOUS
  Filled 2015-05-11: qty 18

## 2015-05-11 MED ORDER — SODIUM CHLORIDE 0.9 % IV SOLN
10.0000 mg | Freq: Once | INTRAVENOUS | Status: AC
  Administered 2015-05-11: 10 mg via INTRAVENOUS
  Filled 2015-05-11: qty 1

## 2015-05-11 NOTE — Progress Notes (Signed)
[place] Clinical Social Work  Clinical Social Work was referred by [place] rounding to check in and reassessment of psychosocial needs due to issues with medications. Pt reports to be doing well and got his medications straight currently. He currently denies other needs or concerns and agrees to reach out as needs arise. CSW will continue to check in as needed.   Clinical Social Work interventions: Check in   [place], [place] Tuesdays   [phone]

## 2015-05-11 NOTE — Patient Instructions (Addendum)
[place] at [facility] Discharge Instructions  RECOMMENDATIONS MADE BY THE CONSULTANT AND ANY TEST RESULTS WILL BE SENT TO YOUR REFERRING PHYSICIAN.   Exam and discussion by Dr Whitney Muse today Lab work is good today We worry if you have cold induced neuropathy that hasnt went away before you come back to see [place] Chemotherapy today, return Thursday to have pump removed Return to see the doctor in 2 weeks B12 injection monthly   Please call the clinic if you have any questions or concerns     Thank you for choosing [place] at [facility] to provide your oncology and hematology care.  To afford each patient quality time with our provider, please arrive at least 15 minutes before your scheduled appointment time.   Beginning January 23rd 2017 lab work for the [facility] will be done in the  Main lab at [facility] on 1st floor. If you have a lab appointment with the [facility] please come in thru the  Main Entrance and check in at the main information desk  You need to re-schedule your appointment should you arrive 10 or more minutes late.  We strive to give you quality time with our providers, and arriving late affects you and other patients whose appointments are after yours.  Also, if you no show three or more times for appointments you may be dismissed from the clinic at the providers discretion.     Again, thank you for choosing [facility].  Our hope is that these requests will decrease the amount of time that you wait before being seen by our physicians.       _____________________________________________________________  Should you have questions after your visit to [facility], please contact our office at [phone] between the hours of 8:30 a.m. and 4:30 p.m.  Voicemails left after 4:30 p.m. will not be returned until the following business day.  For prescription refill requests, have your pharmacy  contact our office.         Resources For Cancer Patients and their Caregivers ? American Cancer Society: Can assist with transportation, wigs, general needs, runs Look Good Feel Better.        8704684071 ? Cancer Care: Provides financial assistance, online support groups, medication/co-pay assistance.  1-800-813-HOPE 319-742-9264) ? [place] Assists [place] cancer patients and their families through emotional , educational and financial support.  775-585-9582 ? [place] DSS Where to apply for food stamps, Medicaid and utility assistance. 820-055-1417 ? RCATS: Transportation to medical appointments. (930) 497-5086 ? Social Security Administration: May apply for disability if have a Stage IV cancer. 845-209-4947 (916)851-0616 ? [employer], Disability and Transit Services: Assists with nutrition, care and transit needs. [phone]

## 2015-05-11 NOTE — Assessment & Plan Note (Signed)
Continue B12 monthly for now. Will eventually try OTC oral B12 with close observation in future.

## 2015-05-11 NOTE — Patient Instructions (Signed)
[facility] Discharge Instructions for Patients Receiving Chemotherapy   Beginning January 23rd 2017 lab work for the Cancer Center will be done in the  Main lab at [place] on 1st floor. If you have a lab appointment with the Cancer Center please come in thru the  Main Entrance and check in at the main information desk   Today you received the following chemotherapy agents Oxaliplatin, Leucovorin and 5FU pump.  To help prevent nausea and vomiting after your treatment, we encourage you to take your nausea medication as instructed.   If you develop nausea and vomiting, or diarrhea that is not controlled by your medication, call the clinic.  The clinic phone number is [phone]. Office hours are Monday-Friday 8:30am-5:00pm.  BELOW ARE SYMPTOMS THAT SHOULD BE REPORTED IMMEDIATELY:  *FEVER GREATER THAN 101.0 F  *CHILLS WITH OR WITHOUT FEVER  NAUSEA AND VOMITING THAT IS NOT CONTROLLED WITH YOUR NAUSEA MEDICATION  *UNUSUAL SHORTNESS OF BREATH  *UNUSUAL BRUISING OR BLEEDING  TENDERNESS IN MOUTH AND THROAT WITH OR WITHOUT PRESENCE OF ULCERS  *URINARY PROBLEMS  *BOWEL PROBLEMS  UNUSUAL RASH Items with * indicate a potential emergency and should be followed up as soon as possible. If you have an emergency after office hours please contact your primary care physician or go to the nearest emergency department.  Please call the clinic during office hours if you have any questions or concerns.   You may also contact the Patient Navigator at [phone] should you have any questions or need assistance in obtaining follow up care.   Resources For Cancer Patients and their Caregivers ? [employer]: Can assist with transportation, wigs, general needs, runs Look Good Feel Better.        1-888-227-6333 ? Cancer Care: Provides financial assistance, online support groups, medication/co-pay assistance.  1-800-813-HOPE (4673) ? [facility] Assists [place] cancer patients and their families through emotional , educational and financial support.  336-427-4357 ? [place] DSS Where to apply for food stamps, Medicaid and utility assistance. 336-342-1394 ? [facility]: Transportation to medical appointments. 336-347-2287 ? Social Security Administration: May apply for disability if have a Stage IV cancer. 336-342-7796 1-800-772-1213 ? [place] [facility]: Assists with nutrition, care and transit needs. 336-349-2343         

## 2015-05-11 NOTE — Progress Notes (Signed)
[facility] NOTE  Patient Care Team: [name], PA-C as PCP - General (Physician Assistant) Danie Binder, MD as Consulting Physician (Gastroenterology)  CHIEF COMPLAINTS/PURPOSE OF CONSULTATION:  [phone] adenocarcinoma of the terminal ileum, Stage III Peri-neural and LVI noted, 4/19 nodes involved Colonoscopy on 01/12/2015 with Dr Oneida Alar showing mass at the splenic flexure EGD on 01/12/2015 with moderate erosive gastritis from ASA use CEA 01/12/2015 at 6.6 ng/ml Extended R hemicolectomy with Dr. Adonis Huguenin on 02/10/2015   HISTORY OF PRESENTING ILLNESS:  Erik [name] 60 y.o. male is here on referral from Dr. Adonis Huguenin for follow-up of stage III CRC.  Erik [name] was here alone today in a treatment chair receiving cycle #4 FOLFOX.  He said that his side effects from his treatment lasted longer this last cycle but are gone today. They were not worse just lasted longer.    He has been doing pretty good he just gets tired. He likes to go out and do things and tries to do this as much as possible. He just tires out quicker and easier.  His mood is okay he just has a little bit of depression; sometimes he worries if his cancer is going to recur.  His fingers and toes are okay. No problems buttoning his shirts.   He is eating okay. He has had no more than usual diarrhea which he comments he has had since his surgery.   He doesn't sleep very good but he can relax and keep calm.  He does not need any prescription refills today.      Adenocarcinoma of transverse colon ([place])   01/12/2015 Pathologic Stage Colon, biopsy, distal transverse - TUBULOVILLOUS ADENOMA WITH HIGH GRADE DYSPLASIA.   01/12/2015 Procedure Colonoscopy by Dr. Oneida Alar.   01/12/2015 Tumor Marker CEA: 6.6 (H)    01/18/2015 Imaging CT abd/pelvis- Apple-core lesion identified in the mid transverse colon without obstruction. No evidence for lymphadenopathy in the gastrohepatic ligament  or omentum.  Stable 8 mm hypo attenuating lesion in the left liver, likely a cyst.   02/10/2015 Initial Diagnosis Adenocarcinoma of transverse colon ([place])   02/12/2015 Definitive Surgery [name], Extended right hemicolectomy    02/12/2015 Pathology Results Mucinous adenocarcinoma with penetration of visceral peritoneum, 4/19 lymph nodes for metasattic disease, negative resection margins, with LVI and perineural invasion   03/30/2015 -  Chemotherapy FOLFOX     MEDICAL HISTORY:  Past Medical History  Diagnosis Date  . Head trauma 2001    closed head injury; coma for 4 weeks  . Depression   . Abnormal stress echocardiogram   . Hypercholesterolemia   . Hypertension   . Alcohol abuse     Heavy Use up until 2010  . GERD (gastroesophageal reflux disease)   . PTSD (post-traumatic stress disorder)   . Anxiety   . Adenocarcinoma of transverse colon ([place]) 02/10/2015   Ended up with the cane due to severe brain trauma and head injury in '01 Fell off of a piece of equipment at work and his head was the first thing to hit the concrete floor No one knows the origin of the fall He was in a coma for four and a half weeks Permanent balance/equilibrium problem as a result Carries the cane so that if he starts to lose his balance, he won't fall Used to fall pretty frequently; had 2-3 concussions from blacking out and falling against brick walls or concrete  Last year to year and a half, he  says it's gotten better Hasn't blacked out, but does fall every once in a while Says this mostly happens after he's been sitting or laying for a while  Example: If he gets up to use the bathroom in the middle of the night, he will stumble and fall against the wall He denies any problems with neuropathy, saying he will get dizzy in his head He says sometimes he knows when it's coming on, and sometimes he won't His vision gets a little blurry too Works with neurologist Dr. Susa Raring at [facility] in [place]: Past Surgical History  Procedure Laterality Date  . Kidney surgery      >30 years ago  . Cardiac cath    . Craniotomy  2001  . Head injury surgery    . Colonoscopy  2011    Dr. Oneida Alar: multiple adenomas and hyperplastic polyps  . Colonoscopy with propofol N/A 01/12/2015    Procedure: COLONOSCOPY WITH PROPOFOL;  Surgeon: Danie Binder, MD;  [facility]: [facility];  Service: Endoscopy;  Laterality: N/A;  1030  . Esophagogastroduodenoscopy (egd) with propofol N/A 01/12/2015    Procedure: ESOPHAGOGASTRODUODENOSCOPY (EGD) WITH PROPOFOL;  Surgeon: Danie Binder, MD;  Location: [facility];  Service: Endoscopy;  Laterality: N/A;  . Hernia repair Right 2012    Inguinal- Forestine Na  . Laparoscopic right hemi colectomy Left 02/10/2015    Procedure: LAPAROSCOPIC THEN OPEN RIGHT HEMI COLECTOMY;  Surgeon: [name], MD;  Location: [facility] ORS;  Service: General;  Laterality: Left;  . Portacath placement N/A 03/24/2015    Procedure: INSERTION PORT-A-CATH;  Surgeon: Jules Husbands, MD;  Location: [facility] ORS;  Service: General;  Laterality: N/A;    SOCIAL HISTORY: Social History   Social History  . Marital Status: Divorced    Spouse Name: N/A  . Number of Children: N/A  . Years of Education: N/A   Occupational History  . Full time    Social History Main Topics  . Smoking status: Former Smoker -- 0.25 packs/day    Types: Cigarettes    Quit date: 02/10/2015  . Smokeless tobacco: Never Used     Comment: patient is aware he needs to quit  . Alcohol Use: 0.0 oz/week    0 Standard drinks or equivalent per week     Comment: beer occ, history of ETOH abuse in remote past.   . Drug Use: Yes    Special: Marijuana     Comment: marijuana last 2012  . Sexual Activity:    Partners: Female   Other Topics Concern  . 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 Ataxia Neg Hx   . Chorea Neg Hx   . Dementia Neg Hx   . Mental retardation Neg Hx   . Migraines  Neg Hx   . Multiple sclerosis Neg Hx   . Neurofibromatosis Neg Hx   . Neuropathy Neg Hx   . Parkinsonism Neg Hx   . Seizures Neg Hx   . Stroke Neg Hx   . Colon cancer Neg Hx   . Pulmonary embolism Mother   . Breast cancer Mother   . Heart disease Father   . Cancer Father 54    Leukemia  . Cancer Mother   . Cancer Paternal Grandfather     Lung   indicated that his mother is deceased. He indicated that his father is deceased.   Mother and father both deceased Mother was 21; died of blood clots in her legs; pulmonary embolism Had a hip replacement and "a bunch of other stuff;" says it's a long complicated story Says "a woman got power of attorney over his mother and they stopped her physical therapy and put her in a nursing home, and due to her inactivity she got blood clots" Father ws 58, died of heart failure No siblings  ALLERGIES:  has No Known Allergies.  MEDICATIONS:  Current Outpatient Prescriptions  Medication Sig Dispense Refill  . albuterol (PROVENTIL HFA;VENTOLIN HFA) 108 (90 BASE) MCG/ACT inhaler Inhale 2 puffs into the lungs every 6 (six) hours as needed for wheezing or shortness of breath. 1 Inhaler 1  . amitriptyline (ELAVIL) 25 MG tablet Take 1 tablet (25 mg total) by mouth at bedtime. 30 tablet 2  . atorvastatin  (LIPITOR) 20 MG tablet Take 1 tablet (20 mg total) by mouth daily. 90 tablet 1  . dexlansoprazole (DEXILANT) 60 MG capsule Take 1 capsule (60 mg total) by mouth daily. 90 capsule 1  . fluorouracil CALGB 38756 in sodium chloride 0.9 % 150 mL Inject into the vein. Reported on 03/22/2015    . FLUoxetine (PROZAC) 20 MG capsule Take 20 mg by mouth 2 (two) times daily.     Marland Kitchen HYDROcodone-acetaminophen (NORCO) 5-325 MG tablet Take 1 tablet by mouth every 6 (six) hours as needed for moderate pain. 60 tablet 0  . leucovorin in dextrose 5 % 250 mL Inject into the vein every 14 (fourteen) days. Reported on 03/22/2015    . lidocaine-prilocaine (EMLA) cream Apply a quarter size amount to port site 1 hour prior to chemo. Do not rub in. Cover with plastic wrap. 30 g 3  . OXALIPLATIN IV Inject into the vein every 14 (fourteen) days. Reported on 03/22/2015    . potassium chloride SA (K-DUR,KLOR-CON) 20 MEQ tablet Take 1 tablet (20 mEq total) by mouth daily. 30 tablet 3  . rosuvastatin (CRESTOR) 40 MG tablet Take 1 tablet (40 mg total) by mouth daily. 90 tablet 1  . sildenafil (VIAGRA) 100 MG tablet Take 0.5-1 tablets (50-100 mg total) by mouth daily as needed for erectile dysfunction. 10 tablet 1   No current facility-administered medications for this visit.   Facility-Administered Medications Ordered in Other Visits  Medication Dose Route Frequency Provider Last Rate Last Dose  . fluorouracil (ADRUCIL) 5,450 mg in sodium chloride 0.9 % 141 mL chemo infusion  2,400 mg/m2 (Treatment Plan Actual) Intravenous 1 day or 1 dose Patrici Ranks, MD      . fluorouracil (ADRUCIL) chemo injection 900 mg  400 mg/m2 (Treatment Plan Actual) Intravenous Once Patrici Ranks, MD      . heparin lock flush 100 unit/mL  500 Units Intracatheter Once PRN Patrici Ranks, MD      . leucovorin 912 mg in dextrose 5 %  250 mL infusion  400 mg/m2 (Treatment Plan Actual) Intravenous Once Patrici Ranks, MD 148 mL/hr at 05/11/15 1134  912 mg at 05/11/15 1134  . oxaliplatin (ELOXATIN) 195 mg in dextrose 5 % 500 mL chemo infusion  85 mg/m2 (Treatment Plan Actual) Intravenous Once Patrici Ranks, MD 270 mL/hr at 05/11/15 1133 195 mg at 05/11/15 1133  . sodium chloride flush (NS) 0.9 % injection 10 mL  10 mL Intracatheter PRN Patrici Ranks, MD   10 mL at 05/11/15 4562    Review of Systems  Constitutional: Positive for fatigue. Negative for fever, chills, weight loss and malaise/fatigue.     Walks with cane due to traumatic head injury in the past. Gets tired.  HENT: Negative.  Negative for congestion, hearing loss, nosebleeds, sore throat and tinnitus.   Eyes: Negative.  Negative for blurred vision, double vision, pain and discharge.  Respiratory: Negative.  Negative for cough, hemoptysis, sputum production, shortness of breath and wheezing.   Cardiovascular: Negative.  Negative for chest pain, palpitations, claudication, leg swelling and PND.  Gastrointestinal: Positive for diarrhea. Negative for heartburn, nausea, vomiting, abdominal pain, constipation, blood in stool and melena.  Every since surgery. Genitourinary: Negative.  Negative for dysuria, urgency, frequency and hematuria.  Musculoskeletal: Positive for falls. Negative for myalgias and joint pain.       Due to traumatic head injury in the past.  Skin: Negative.  Negative for itching and rash.  Neurological: Negative.  Negative for dizziness, tingling, tremors, sensory change, speech change, focal weakness, seizures, loss of consciousness, weakness and headaches.       Traumatic head injury in the past.  Endo/Heme/Allergies: Negative.  Does not bruise/bleed easily.  Psychiatric/Behavioral: Negative.  Negative for depression, suicidal ideas, memory loss and substance abuse. The patient is not nervous/anxious and does not have insomnia.   All other systems reviewed and are negative.  14 point ROS was done and is otherwise as detailed above or in  HPI   PHYSICAL EXAMINATION: ECOG PERFORMANCE STATUS: 0 - Asymptomatic  Vitals with BMI 05/11/2015  Height   Weight 228 lbs  BMI   Systolic 563  Diastolic 88  Pulse 92  Respirations 20    Physical Exam  Constitutional: He is oriented to person, place, and time and well-developed, well-nourished, and in no distress.  HENT:  Head: Normocephalic and atraumatic.  Nose: Nose normal.  Mouth/Throat: Oropharynx is clear and moist. No oropharyngeal exudate.  Eyes: Conjunctivae and EOM are normal. Pupils are equal, round, and reactive to light. Right eye exhibits no discharge. Left eye exhibits no discharge. No scleral icterus.  Neck: Normal range of motion. Neck supple. No tracheal deviation present. No thyromegaly present.  Cardiovascular: Normal rate, regular rhythm and normal heart sounds.  Exam reveals no gallop and no friction rub.   No murmur heard. Pulmonary/Chest: Effort normal and breath sounds normal. He has no wheezes. He has no rales. Port site C/D/I Abdominal: Soft. Bowel sounds are normal. He exhibits no distension and no mass. There is no tenderness. There is no rebound and no guarding.  Musculoskeletal: Normal range of motion. He exhibits no edema.  Lymphadenopathy:    He has no cervical adenopathy.  Neurological: He is alert and oriented to person, place, and time. No cranial nerve deficit. Coordination normal. (cane) Skin: Skin is warm and dry. No rash noted.  Psychiatric: Mood, memory, affect and judgment normal.  Nursing note and vitals reviewed.   LABORATORY DATA:  I have reviewed the  data as listed Lab Results  Component Value Date   WBC 4.7 05/11/2015   HGB 12.4* 05/11/2015   HCT 37.0* 05/11/2015   MCV 88.5 05/11/2015   PLT 138* 05/11/2015   CMP     Component Value Date/Time   NA 140 05/11/2015 0913   K 3.7 05/11/2015 0913   CL 104 05/11/2015 0913   CO2 28 05/11/2015 0913   GLUCOSE 112* 05/11/2015 0913   BUN 11 05/11/2015 0913   CREATININE 0.76  05/11/2015 0913   CREATININE 0.86 12/16/2014 0844   CALCIUM 8.7* 05/11/2015 0913   PROT 6.7 05/11/2015 0913   ALBUMIN 3.5 05/11/2015 0913   AST 19 05/11/2015 0913   ALT 15* 05/11/2015 0913   ALKPHOS 57 05/11/2015 0913   BILITOT 0.3 05/11/2015 0913   GFRNONAA >60 05/11/2015 0913   GFRNONAA >89 12/16/2014 0844   GFRAA >60 05/11/2015 0913   GFRAA >89 12/16/2014 0844    PATHOLOGY Surgical Pathology  CASE: [id number]  PATIENT: Erik [name]  Surgical Pathology Report   SPECIMEN SUBMITTED:  A. Peritoneal biopsy  B. Extended hemicolectomy, right   CLINICAL HISTORY:  Potential metastatic colon cancer; Tubular adenoma of colon   PRE-OPERATIVE DIAGNOSIS:    POST-OPERATIVE DIAGNOSIS:    DIAGNOSIS:  A. PERITONEUM; BIOPSY:  - FIBROADIPOSE TISSUE WITH INFLAMMATION.   B. RIGHT COLON; EXTENDED RIGHT HEMICOLECTOMY:  - MUCINOUS ADENOCARCINOMA WITH PENETRATION OF THE VISCERAL PERITONEUM.  - SEPARATE TUBULAR ADENOMA OF THE CECUM.  - APPENDIX WITHIN NORMAL LIMITS.  - FOUR OF NINETEEN LYMPH NODES INVOLVED BY METASTATIC CARCINOMA (4/19).  - THE MARGINS OF RESECTION ARE NEGATIVE.  - SEE SUMMARY BELOW.   Note:  Ample material is available for MSI and other ancillary testing.    COLON AND RECTUM: Resection, Including Transanal Disk Excision of Rectal  Neoplasms  Specimens InvolvedB: Extended hemicolectomy, right   Colon and Rectum, Resection Cancer Case Summary  SPECIMEN  Specimen: Terminal ileum  Cecum  Appendix  Ascending colon  Procedure:   Right hemicolectomy  Primary Tumor Site: Right (ascending) colon  Additional Sites Involved by Tumor:   None identified  Macroscopic Tumor Perforation:   Cannot be determined  unspecified  TUMOR  Histologic Type:  Mucinous adenocarcinoma  Histologic Grade:  Other (specify)  Defer to optional MSI testing for grade assignment: Mucinous  adenocarcinoma is no longer considered definitionally high grade. MSI-H  carcinomas are  low grade while MSS or MSI-L carcinomas are high grade  EXTENT  Tumor Size:  Greatest dimension (cm)  4.8cm  Microscopic Tumor Extension: Tumor penetrates to the surface of the  visceral peritoneum (serosa)  MARGINS  Proximal Margin:  Uninvolved by invasive carcinoma  - No adenoma or intraepithelial neoplasia / dysplasia identified  Distal Margin: Uninvolved by invasive carcinoma  - No adenoma or intraepithelial neoplasia / dysplasia identified  Circumferential (Radial) or Mesenteric Margin:  Uninvolved by invasive  carcinoma  Deep Margin:  n/a  ACCESSORY FINDINGS  Lymph-Vascular Invasion: Present  Perineural Invasion:   Present  Tumor Deposits:   Not identified  not identified  STAGE (pTNM)  TNM Descriptors:  n/a  Primary Tumor (pT): pT4a: Tumor penetrates the visceral peritoneum  Regional Lymph Nodes (pN)  pN2a: Metastasis in 4 to 6 regional lymph nodes  Number of Lymph Nodes Examined:  Specify  19  Number of Lymph Nodes Involved:  Specify  4  Distant Metastasis (pM): Not applicable    GROSS DESCRIPTION:  A. Intra Operative Consultation:    Received: Fresh  Specimen: Peritoneal biopsy    Pathologic Evaluation: Frozen section    Diagnosis: Frozen section A1 Peritoneal Biopsy: Grossly fatty  specimen. Fibrosis and inflammation. No carcinoma seen.    Communicated to: Dr. Adonis Huguenin at 2:33 PM on 02/10/2015 by Dr. Luana Shu    Tissue submitted: A1   Labeled: Peritoneal biopsy   Tissue fragment(s): 1   Size: 1.5 x 0.6 x 0.3 cm   Description: Piece of red yellow adipose tissue, sectioned   Entirely submitted in one cassette(s).    B. Labeled: Extended right hemicolectomy  Type of procedure: Right hemicolectomy  Measurement: Terminal ileum 7 cm in length by 5.5 cm in circumference,  appendix 5 cm in length and from 0.5-0.7 cm in diameter, cecum and right  colon measure 33.5 cm in length and range from 7-12.5 cm in  circumference.   Orientation: Serosa inked blue, radial fat margin inked black.  Number of mass(es): 1  Location of mass(es): Ascending colon  Size(s) of mass(es): 4.8 x 4.5 x 1.2 cm  Description of mass(es): Pink red raised with rolled edges firm nodular  mass.  Bowel circumference at site of mass(es): 7.5 cm  Gross depth of invasion: Lesion appears to grossly penetrate through the  wall into the pericolonic fat and is grossly visualized on the serosa.  Macroscopic perforation: Yes  Margins:    Proximal: 26.8 cm  Distal: 5 cm  Circumferential (Radial): 6.5 cm  Mesenteric: Less than 0.1 cm   Luminal obstruction: At least 75% obstruction  Other remarkable findings: Ileocecal valve is somewhat lipomatous. There  is a small raised pink-red polyp located 4.5 cm away from the ileocecal  valve pouch, appears to be confined to the mucosa.  Lymph nodes: Dissection of the pericolonic fat reveals 18 pink red to  dark brown lymph node candidates ranging from 0.3 cm in diameter up to  1.6 x 0.9 x 0.8 cm, some of which are grossly suspicious for tumor  involvement.  Block summary:  1-terminal ileal margin  2-distal colonic margin  3-radial fat margin  4-7-sections of mass including inked serosa and underlying pericolonic  fat  8-ileocecal valve  9-sections of appendix  10-polyp in cecal pouch  11-12- 5 lymph node candidates per cassette  13- 4 lymph nodes candidates  14-17 1 serially sectioned lymph node per cassette    Final Diagnosis performed by Delorse Lek, MD. Electronically signed  02/12/2015 2:17:44PM   The electronic signature indicates that the named Attending Pathologist  has evaluated the specimen   Technical component performed at [facility], [address], [place],  [place] [address]  Lab: [phone] Dir: Darrick Penna. Evette Doffing, MD   Professional component performed at [facility], [facility],  [place], [place],  [address]  Lab: [phone] Dir: Dellia Nims.  Reuel Derby, MD    RADIOGRAPHIC STUDIES: I have personally reviewed the radiological images as listed and agreed with the findings in the report.  03/15/2015 Study Result     CLINICAL DATA: Transverse colon adenocarcinoma status post right hemicolectomy on 02/10/2015, presenting for chest staging.  EXAM: CT CHEST WITH CONTRAST  TECHNIQUE: Multidetector CT imaging of the chest was performed during intravenous contrast administration.  CONTRAST: 20m OMNIPAQUE IOHEXOL 300 MG/ML SOLN  COMPARISON: 02/17/2010 chest CT. 02/13/2015 chest radiograph.  FINDINGS: Mediastinum/Nodes: Normal heart size. No pericardial fluid/thickening. Left anterior descending coronary atherosclerosis. Great vessels are normal in course and caliber. No central pulmonary emboli. Normal visualized thyroid. Normal esophagus. No pathologically enlarged axillary, mediastinal or hilar lymph  nodes.  Lungs/Pleura: No pneumothorax. No pleural effusion. Mild centrilobular emphysema and diffuse bronchial wall thickening. There is a subsolid 1.1 x 1.1 cm left upper lobe pulmonary nodule (series 3/ image 30), new since 02/17/2010. There is patchy centrilobular nodularity throughout both lungs, upper lobe predominant, not appreciably changed since 02/17/2010. No acute consolidative airspace disease or additional significant pulmonary nodules.  Upper abdomen: Unremarkable.  Musculoskeletal: No aggressive appearing focal osseous lesions. Mild degenerative changes are seen in the thoracic spine.  IMPRESSION: 1. Subsolid 1.1 cm left upper lobe pulmonary nodule, new since 02/17/2010, indeterminate (favor either a focal irregular scar or primary lung neoplasm, unlikely to represent a metastasis). Initial follow-up by chest CT without contrast is recommended in 3 months to confirm persistence. This recommendation follows the consensus statement: Recommendations for the Management of Subsolid  Pulmonary Nodules Detected at CT: A Statement from the [employer] as published in Radiology 2013; 266:304-317. 2. Patchy upper lobe predominant centrilobular nodularity throughout both lungs, not appreciably changed since 02/17/2010, likely representing smoking related interstitial lung disease (respiratory bronchiolitis if the patient is asymptomatic or respiratory bronchiolitis - interstitial lung disease (RB-ILD) if the patient is symptomatic). 3. Mild centrilobular emphysema and diffuse bronchial wall thickening, suggesting COPD. 4. Coronary atherosclerosis.   Electronically Signed  By: Ilona Sorrel M.D.  On: 03/15/2015 09:38    01/18/2015   CLINICAL DATA: Subsequent encounter for colon mass found on colonoscopy last week.  EXAM: CT ABDOMEN AND PELVIS WITH CONTRAST  TECHNIQUE: Multidetector CT imaging of the abdomen and pelvis was performed using the standard protocol following bolus administration of intravenous contrast.  CONTRAST: 162m OMNIPAQUE IOHEXOL 300 MG/ML SOLN  COMPARISON: 09/28/2009  FINDINGS: Lower chest: Unremarkable.  Hepatobiliary: 8 mm hypo attenuating lesion in the anterior left liver is stable. There is no evidence for gallstones, gallbladder wall thickening, or pericholecystic fluid. No intrahepatic or extrahepatic biliary dilation.  Pancreas: No focal mass lesion. No dilatation of the main duct. No intraparenchymal cyst. No peripancreatic edema.  Spleen: No splenomegaly. No focal mass lesion.  Adrenals/Urinary Tract: No adrenal nodule or mass. Kidneys are unremarkable. No evidence for hydroureter. The urinary bladder appears normal for the degree of distention.  Stomach/Bowel: Stomach is nondistended. No gastric wall thickening. No evidence of outlet obstruction. Duodenum is normally positioned as is the ligament of Treitz. No small bowel wall thickening. No small bowel dilatation. The terminal ileum is  normal. The appendix is normal. Apple-core lesion identified in the mid transverse colon without obstruction.  Vascular/Lymphatic: There is abdominal aortic atherosclerosis without aneurysm. No gastrohepatic or hepatoduodenal ligament lymphadenopathy. No retroperitoneal lymphadenopathy. No definite lymphadenopathy adjacent to the transverse colon lesion. No evidence for lymphadenopathy in the gastrohepatic ligament or omentum.  Reproductive: Prostate gland is mildly enlarged with central dystrophic calcification  Other: No intraperitoneal free fluid.  Musculoskeletal: Bone windows reveal no worrisome lytic or sclerotic osseous lesions.  IMPRESSION: 1. Apple-core lesion identified in the mid transverse colon without obstruction. No evidence for lymphadenopathy in the gastrohepatic ligament or omentum. 2. Stable 8 mm hypo attenuating lesion in the left liver, likely a cyst.   Electronically Signed  By: EMisty StanleyM.D.  On: 01/18/2015 11:23  ASSESSMENT & PLAN:  [id number] adenocarcinoma of the terminal ileum, Stage III Peri-neural and LVI noted, 4/19 nodes involved Colonoscopy on 01/12/2015 with Dr FOneida Alarshowing mass at the splenic flexure EGD on 01/12/2015 with moderate erosive gastritis from ASA use CEA 01/12/2015 at 6.6 ng/ml Extended R hemicolectomy with Dr. WAdonis Hugueninon 02/10/2015 1.1  cm LUL pulmonary nodule B12 deficiency 149 pg/ml   Adenocarcinoma of transverse colon (HCC) Stage IIIC ([id number]) adenocarcinoma of transverse colon, S/P definitive surgery with a right extended hemicolectomy by Dr. [name].  Now on systemic adjuvant therapy consisting of FOLFOX.  Oncology history is up to date.  Return in 2 week with pre-treatment labs: CBC diff, CMET  Return in 2 weeks for follow-up and next cycle of FOLFOX. He is doing well with therapy. Currently no symptoms of neuropathy  B12 deficiency Continue B12 monthly for now. Will eventually try OTC oral B12  with close observation in future.   He will return for a follow up and chemotherapy treatment on 05/25/15.  All questions were answered. The patient knows to call the clinic with any problems, questions or concerns.  This document serves as a record of services personally performed by Ancil Linsey, MD. It was created on her behalf by Kandace Blitz, a trained medical scribe. The creation of this record is based on the scribe's personal observations and the provider's statements to them. This document has been checked and approved by the attending provider.  I have reviewed the above documentation for accuracy and completeness and I agree with the above.  This note was electronically signed.  Molli Hazard, MD  05/11/2015 1:00 PM

## 2015-05-11 NOTE — Assessment & Plan Note (Signed)
Stage IIIC [facility]) adenocarcinoma of transverse colon, S/P definitive surgery with a right extended hemicolectomy by Dr. [name].  Now on systemic adjuvant therapy consisting of FOLFOX.  Oncology history is up to date.  Return in 2 week with pre-treatment labs: CBC diff, CMET  Return in 2 weeks for follow-up and next cycle of FOLFOX. He is doing well with therapy. Currently no symptoms of neuropathy

## 2015-05-11 NOTE — Progress Notes (Signed)
Tolerated chemo well. Continuous infusion pump intact. Ambulatory on discharge home to self. 

## 2015-05-12 ENCOUNTER — Ambulatory Visit (INDEPENDENT_AMBULATORY_CARE_PROVIDER_SITE_OTHER): Payer: Medicaid Other | Admitting: Cardiology

## 2015-05-12 VITALS — BP 124/62 | HR 92 | Ht 74.0 in | Wt 227.0 lb

## 2015-05-12 DIAGNOSIS — I951 Orthostatic hypotension: Secondary | ICD-10-CM | POA: Diagnosis not present

## 2015-05-12 DIAGNOSIS — Z79899 Other long term (current) drug therapy: Secondary | ICD-10-CM | POA: Diagnosis not present

## 2015-05-12 MED ORDER — FLUDROCORTISONE ACETATE 0.1 MG PO TABS: 0.1000 mg | ORAL_TABLET | Freq: Every day | ORAL | Status: DC

## 2015-05-12 NOTE — Progress Notes (Signed)
Patient ID: ILLIAS ARGETSINGER, male   DOB: 1955-12-11, 60 y.o.   MRN: [id number]     Clinical Summary Mr. Benezra is a 60 y.o.male seen today for follow up of the following medical problems.   1. Syncope - since 08/2012 reports some occasonal episodes of syncope. First episode 08/2012 occurred while sitting on porch, stood up to go to bathroom. [place] and blacked out. No significant prodrome. No chest pain or palpitaitons. Seen at [facility] after that event, found to have subdural and subarachnoid bleed, managed conservatively. Found to have very high EtOH level at 217.   -has has 2 other similar episodes. The 2nd episode once again occurred while sitting to standing. The 3rd episode occurred this past Sunday while he was walking up his driveway. - echo 08/2012 LVEF 55-60%, no significant pathology - 06/2013 30 day monitor without arrhythmias - he has been seen by neurology, found to be severely orthostatic in Dr Georgie Chard clinic. SBP dropped 40 points with standing - he admits to heavy ice tea drinking daily, not much other fluid intake. He is also on Toprol XL.    - still with balance issues. Can have presyncopal episodes. No recent syncopal episodes - water bottles x 5-6, gatorade x 2 bottles, occasional sweet tea. Has cut back on EtOH, only few days a month.     2. Colon cancer - followed by oncology, stage III adenocarcinoma s/p hemicolectomy now on chemo  Past Medical History  Diagnosis Date  . Head trauma 2001    closed head injury; coma for 4 weeks  . Depression   . Abnormal stress echocardiogram   . Hypercholesterolemia   . Hypertension   . Alcohol abuse     Heavy Use up until 2010  . GERD (gastroesophageal reflux disease)   . PTSD (post-traumatic stress disorder)   . Anxiety   . Adenocarcinoma of transverse colon ([place]) 02/10/2015     No Known Allergies   Current Outpatient Prescriptions  Medication Sig Dispense Refill  . albuterol (PROVENTIL HFA;VENTOLIN HFA) 108 (90 BASE)  MCG/ACT inhaler Inhale 2 puffs into the lungs every 6 (six) hours as needed for wheezing or shortness of breath. 1 Inhaler 1  . amitriptyline (ELAVIL) 25 MG tablet Take 1 tablet (25 mg total) by mouth at bedtime. 30 tablet 2  . atorvastatin (LIPITOR) 20 MG tablet Take 1 tablet (20 mg total) by mouth daily. 90 tablet 1  . dexlansoprazole (DEXILANT) 60 MG capsule Take 1 capsule (60 mg total) by mouth daily. 90 capsule 1  . fluorouracil CALGB 09811 in sodium chloride 0.9 % 150 mL Inject into the vein. Reported on 03/22/2015    . FLUoxetine (PROZAC) 20 MG capsule Take 20 mg by mouth 2 (two) times daily.     Marland Kitchen HYDROcodone-acetaminophen (NORCO) 5-325 MG tablet Take 1 tablet by mouth every 6 (six) hours as needed for moderate pain. 60 tablet 0  . leucovorin in dextrose 5 % 250 mL Inject into the vein every 14 (fourteen) days. Reported on 03/22/2015    . lidocaine-prilocaine (EMLA) cream Apply a quarter size amount to port site 1 hour prior to chemo. Do not rub in. Cover with plastic wrap. 30 g 3  . OXALIPLATIN IV Inject into the vein every 14 (fourteen) days. Reported on 03/22/2015    . potassium chloride SA (K-DUR,KLOR-CON) 20 MEQ tablet Take 1 tablet (20 mEq total) by mouth daily. 30 tablet 3  . rosuvastatin (CRESTOR) 40 MG tablet Take 1 tablet (40 mg total)  by mouth daily. 90 tablet 1  . sildenafil (VIAGRA) 100 MG tablet Take 0.5-1 tablets (50-100 mg total) by mouth daily as needed for erectile dysfunction. 10 tablet 1   No current facility-administered medications for this visit.     Past Surgical History  Procedure Laterality Date  . Kidney surgery      >30 years ago  . Cardiac cath    . Craniotomy  2001  . Head injury surgery    . Colonoscopy  2011    Dr. Oneida Alar: multiple adenomas and hyperplastic polyps  . Colonoscopy with propofol N/A 01/12/2015    Procedure: COLONOSCOPY WITH PROPOFOL;  Surgeon: Danie Binder, MD;  [facility];  Service: Endoscopy;  Laterality: N/A;  1030  .  Esophagogastroduodenoscopy (egd) with propofol N/A 01/12/2015    Procedure: ESOPHAGOGASTRODUODENOSCOPY (EGD) WITH PROPOFOL;  Surgeon: Danie Binder, MD;  Location: [facility];  Service: Endoscopy;  Laterality: N/A;  . Hernia repair Right 2012    Inguinal- Forestine Na  . Laparoscopic right hemi colectomy Left 02/10/2015    Procedure: LAPAROSCOPIC THEN OPEN RIGHT HEMI COLECTOMY;  Surgeon: [name], MD;  Location: [facility] ORS;  Service: General;  Laterality: Left;  . Portacath placement N/A 03/24/2015    Procedure: INSERTION PORT-A-CATH;  Surgeon: Jules Husbands, MD;  Location: [facility] ORS;  Service: General;  Laterality: N/A;     No Known Allergies    Family History  Problem Relation Age of Onset  . Ataxia Neg Hx   . Chorea Neg Hx   . Dementia Neg Hx   . Mental retardation Neg Hx   . Migraines Neg Hx   . Multiple sclerosis Neg Hx   . Neurofibromatosis Neg Hx   . Neuropathy Neg Hx   . Parkinsonism Neg Hx   . Seizures Neg Hx   . Stroke Neg Hx   . Colon cancer Neg Hx   . Pulmonary embolism Mother   . Breast cancer Mother   . Heart disease Father   . Cancer Father 22    Leukemia  . Cancer Mother   . Cancer Paternal Grandfather     Lung     Social History Mr. Kalm reports that he quit smoking about 2 months ago. His smoking use included Cigarettes. He smoked 0.25 packs per day. He has never used smokeless tobacco. Mr. Novakovic reports that he drinks alcohol.   Review of Systems CONSTITUTIONAL: No weight loss, fever, chills, weakness or fatigue.  HEENT: Eyes: No visual loss, blurred vision, double vision or yellow sclerae.No hearing loss, sneezing, congestion, runny nose or sore throat.  SKIN: No rash or itching.  CARDIOVASCULAR: no chest pain, no palpitations RESPIRATORY: No shortness of breath, cough or sputum.  GASTROINTESTINAL: No anorexia, nausea, vomiting or diarrhea. No abdominal pain or blood.  GENITOURINARY: No burning on urination, no polyuria NEUROLOGICAL: +  dizziness MUSCULOSKELETAL: No muscle, back pain, joint pain or stiffness.  LYMPHATICS: No enlarged nodes. No history of splenectomy.  PSYCHIATRIC: No history of depression or anxiety.  ENDOCRINOLOGIC: No reports of sweating, cold or heat intolerance. No polyuria or polydipsia.  Marland Kitchen   Physical Examination Filed Vitals:   05/12/15 1114  BP: 124/62  Pulse: 92   Filed Vitals:   05/12/15 1114  Height: 6\' 2"  (1.88 m)  Weight: 227 lb (102.967 kg)    Gen: resting comfortably, no acute distress HEENT: no scleral icterus, pupils equal round and reactive, no palptable cervical adenopathy,  CV: RRR, no m/r/g, no jvd  Resp: Clear to auscultation bilaterally GI: abdomen is soft, non-tender, non-distended, normal bowel sounds, no hepatosplenomegaly MSK: extremities are warm, no edema.  Skin: warm, no rash Neuro:  no focal deficits Psych: appropriate affect   Diagnostic Studies 03/2009 Cath Central aortic pressure 142/86 with mean of 110. LV pressure 135/1 with an EDP of 13. There was no aortic stenosis.  Left main was normal.  LAD gave off moderate-to-large diagonal. It had a 20% stenosis in the midsection.  Left circumflex gave off small ramus, small OM-1, and moderate-to-large OM-2. It was angiographically normal.  Right coronary artery was a dominant vessel that gave off a PDA and posterolateral. There was diffuse approximately 20% luminal plaquing in the proximal and midsection.  Left ventriculogram done in the RAO position showed an EF of 555 to 60% with no regional wall motion abnormalities.  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 There was no significant stenosis. There was no evidence of abdominal aortic aneurysm.  ASSESSMENT: 1. Minimal nonobstructive coronary artery disease. 2. Normal left ventricular function. 3. Normal renal arteries. No evidence of abdominal aortic aneurysm.  PLAN/DISCUSSION:  Given his angiography, it appears that his chest pain was noncardiac and his stress test was a false positive. We will continue with medical therapy and aggressive risk factor modification.  08/2012 Echo LVEF 55-60%, no WMAs, normal RV, normal PASP  Jan 2015 Positive orthostatics:  Rt arm: Lft arm:  130/78 supine 120/78 supine  104/70 sitting 128/72 sitting  90/60 standing 92/68 standing    Assessment and Plan  1. Orthostatic syncope - history and clinical finding consistent with orthostatic syncope, no clear evidence for cardiac cause at this time. Prior cardiac studies have shown no structural heart disease. Symptoms somewhat improved but not resolved with increased oral intake, this can also be complicated at times by his ongoing chemotherapy.  - His SBP drops from 124 to 100 with standing in clinic. We will start florinef 0.1mg  daily - check cortisol  F/u 3-4 weeks       Arnoldo Lenis, M.D.

## 2015-05-12 NOTE — Patient Instructions (Signed)
Medication Instructions:  Start florinef 0.1 mg daily  Labwork: Your physician recommends that you return for lab work in: asap   Testing/Procedures: none  Follow-Up: Your physician recommends that you schedule a follow-up appointment in: To be determined    Any Other Special Instructions Will Be Listed Below (If Applicable).     If you need a refill on your cardiac medications before your next appointment, please call your pharmacy.

## 2015-05-13 ENCOUNTER — Encounter (HOSPITAL_BASED_OUTPATIENT_CLINIC_OR_DEPARTMENT_OTHER): Payer: Medicaid Other

## 2015-05-13 VITALS — BP 152/90 | HR 86 | Temp 98.6°F | Resp 18

## 2015-05-13 DIAGNOSIS — C184 Malignant neoplasm of transverse colon: Secondary | ICD-10-CM

## 2015-05-13 DIAGNOSIS — Z452 Encounter for adjustment and management of vascular access device: Secondary | ICD-10-CM

## 2015-05-13 MED ORDER — SODIUM CHLORIDE 0.9% FLUSH
10.0000 mL | INTRAVENOUS | Status: DC | PRN
  Administered 2015-05-13: 10 mL
  Filled 2015-05-13: qty 10

## 2015-05-13 MED ORDER — HEPARIN SOD (PORK) LOCK FLUSH 100 UNIT/ML IV SOLN
500.0000 [IU] | Freq: Once | INTRAVENOUS | Status: AC | PRN
  Administered 2015-05-13: 500 [IU]

## 2015-05-13 NOTE — Patient Instructions (Signed)
[place] at [facility] Discharge Instructions  RECOMMENDATIONS MADE BY THE CONSULTANT AND ANY TEST RESULTS WILL BE SENT TO YOUR REFERRING PHYSICIAN.  Discontinued continuous infusion pump. Continue to monitor jaw pain as instructed and report to clinic any persistant or worsening symptoms.  Thank you for choosing [place] at [facility] to provide your oncology and hematology care.  To afford each patient quality time with our provider, please arrive at least 15 minutes before your scheduled appointment time.   Beginning January 23rd 2017 lab work for the [facility] will be done in the  Main lab at [facility] on 1st floor. If you have a lab appointment with the [facility] please come in thru the  Main Entrance and check in at the main information desk  You need to re-schedule your appointment should you arrive 10 or more minutes late.  We strive to give you quality time with our providers, and arriving late affects you and other patients whose appointments are after yours.  Also, if you no show three or more times for appointments you may be dismissed from the clinic at the providers discretion.     Again, thank you for choosing [facility].  Our hope is that these requests will decrease the amount of time that you wait before being seen by our physicians.       _____________________________________________________________  Should you have questions after your visit to [facility], please contact our office at [phone] between the hours of 8:30 a.m. and 4:30 p.m.  Voicemails left after 4:30 p.m. will not be returned until the following business day.  For prescription refill requests, have your pharmacy contact our office.    Resources For Cancer Patients and their Caregivers ? American Cancer Society: Can assist with transportation, wigs, general needs, runs Look Good Feel Better.         956-481-8752 ? Cancer Care: Provides financial assistance, online support groups, medication/co-pay assistance.  1-800-813-HOPE 9037912975) ? [place] Assists [place] cancer patients and their families through emotional , educational and financial support.  (774)088-2148 ? [place] DSS Where to apply for food stamps, Medicaid and utility assistance. 6134229575 ? [facility]: Transportation to medical appointments. (316)525-0748 ? Social Security Administration: May apply for disability if have a Stage IV cancer. (417)705-6210 9737192914 ? [employer], Disability and Transit Services: Assists with nutrition, care and transit needs. 5410249590

## 2015-05-13 NOTE — Progress Notes (Signed)
Continuous infusion pump completed. Flushed port per protocol and removed needle.  Patient reports increased cold sensitivity and fatigue with this treatment. Also reports jaw pain when chewing but states it only hurts for a few "chews" then it goes away. Informed Dr.Penland of patient's complaints. Per her instructions informed patient jaw pain can be a side effect of the oxaliplatin and it should subside. Instruct patient to monitor jaw pain and report to [place] any worsening of symptoms or if symptoms persist and do not go away. Patient verbalized understanding.  Patient ambulatory on discharge home to self.

## 2015-05-14 ENCOUNTER — Encounter: Payer: Self-pay | Admitting: Cardiology

## 2015-05-14 ENCOUNTER — Ambulatory Visit: Payer: Self-pay | Admitting: Cardiology

## 2015-05-25 ENCOUNTER — Encounter: Payer: Self-pay | Admitting: *Deleted

## 2015-05-25 ENCOUNTER — Encounter (HOSPITAL_COMMUNITY): Payer: Self-pay | Admitting: Hematology & Oncology

## 2015-05-25 ENCOUNTER — Encounter (HOSPITAL_BASED_OUTPATIENT_CLINIC_OR_DEPARTMENT_OTHER): Payer: Medicaid Other

## 2015-05-25 ENCOUNTER — Encounter (HOSPITAL_BASED_OUTPATIENT_CLINIC_OR_DEPARTMENT_OTHER): Payer: Medicaid Other | Admitting: Hematology & Oncology

## 2015-05-25 ENCOUNTER — Encounter (HOSPITAL_COMMUNITY): Payer: Medicaid Other

## 2015-05-25 VITALS — BP 149/95 | HR 109 | Temp 97.9°F | Resp 18 | Wt 220.6 lb

## 2015-05-25 VITALS — BP 157/71 | HR 101 | Temp 98.0°F | Resp 18

## 2015-05-25 DIAGNOSIS — E538 Deficiency of other specified B group vitamins: Secondary | ICD-10-CM

## 2015-05-25 DIAGNOSIS — Z8659 Personal history of other mental and behavioral disorders: Secondary | ICD-10-CM | POA: Diagnosis not present

## 2015-05-25 DIAGNOSIS — C184 Malignant neoplasm of transverse colon: Secondary | ICD-10-CM | POA: Diagnosis present

## 2015-05-25 DIAGNOSIS — Z5111 Encounter for antineoplastic chemotherapy: Secondary | ICD-10-CM | POA: Diagnosis present

## 2015-05-25 DIAGNOSIS — Z720 Tobacco use: Secondary | ICD-10-CM | POA: Diagnosis not present

## 2015-05-25 DIAGNOSIS — E876 Hypokalemia: Secondary | ICD-10-CM | POA: Diagnosis not present

## 2015-05-25 LAB — COMPREHENSIVE METABOLIC PANEL
ALK PHOS: 69 U/L (ref 38–126)
ALT: 16 U/L — AB (ref 17–63)
ANION GAP: 10 (ref 5–15)
AST: 20 U/L (ref 15–41)
Albumin: 3.6 g/dL (ref 3.5–5.0)
BUN: 8 mg/dL (ref 6–20)
CALCIUM: 8.8 mg/dL — AB (ref 8.9–10.3)
CO2: 26 mmol/L (ref 22–32)
CREATININE: 0.77 mg/dL (ref 0.61–1.24)
Chloride: 104 mmol/L (ref 101–111)
Glucose, Bld: 111 mg/dL — ABNORMAL HIGH (ref 65–99)
Potassium: 3.3 mmol/L — ABNORMAL LOW (ref 3.5–5.1)
Sodium: 140 mmol/L (ref 135–145)
TOTAL PROTEIN: 7 g/dL (ref 6.5–8.1)
Total Bilirubin: 0.6 mg/dL (ref 0.3–1.2)

## 2015-05-25 LAB — CBC WITH DIFFERENTIAL/PLATELET
BASOS ABS: 0 10*3/uL (ref 0.0–0.1)
BASOS PCT: 0 %
EOS ABS: 0.1 10*3/uL (ref 0.0–0.7)
Eosinophils Relative: 1 %
HEMATOCRIT: 37.2 % — AB (ref 39.0–52.0)
HEMOGLOBIN: 12.8 g/dL — AB (ref 13.0–17.0)
Lymphocytes Relative: 15 %
Lymphs Abs: 1.1 10*3/uL (ref 0.7–4.0)
MCH: 29.9 pg (ref 26.0–34.0)
MCHC: 34.4 g/dL (ref 30.0–36.0)
MCV: 86.9 fL (ref 78.0–100.0)
Monocytes Absolute: 0.9 10*3/uL (ref 0.1–1.0)
Monocytes Relative: 12 %
NEUTROS PCT: 72 %
Neutro Abs: 5 10*3/uL (ref 1.7–7.7)
Platelets: 123 10*3/uL — ABNORMAL LOW (ref 150–400)
RBC: 4.28 MIL/uL (ref 4.22–5.81)
RDW: 15.5 % (ref 11.5–15.5)
WBC: 7 10*3/uL (ref 4.0–10.5)

## 2015-05-25 MED ORDER — DEXTROSE 5 % IV SOLN
Freq: Once | INTRAVENOUS | Status: AC
  Administered 2015-05-25: 10:00:00 via INTRAVENOUS

## 2015-05-25 MED ORDER — SODIUM CHLORIDE 0.9% FLUSH
10.0000 mL | INTRAVENOUS | Status: DC | PRN
  Administered 2015-05-25: 10 mL
  Filled 2015-05-25: qty 10

## 2015-05-25 MED ORDER — SODIUM CHLORIDE 0.9 % IV SOLN
2400.0000 mg/m2 | INTRAVENOUS | Status: DC
  Administered 2015-05-25: 5450 mg via INTRAVENOUS
  Filled 2015-05-25: qty 109

## 2015-05-25 MED ORDER — POTASSIUM CHLORIDE CRYS ER 20 MEQ PO TBCR
40.0000 meq | EXTENDED_RELEASE_TABLET | Freq: Once | ORAL | Status: AC
  Administered 2015-05-25: 40 meq via ORAL
  Filled 2015-05-25: qty 2

## 2015-05-25 MED ORDER — SODIUM CHLORIDE 0.9 % IV SOLN
10.0000 mg | Freq: Once | INTRAVENOUS | Status: AC
  Administered 2015-05-25: 10 mg via INTRAVENOUS
  Filled 2015-05-25: qty 1

## 2015-05-25 MED ORDER — PALONOSETRON HCL INJECTION 0.25 MG/5ML
0.2500 mg | Freq: Once | INTRAVENOUS | Status: AC
  Administered 2015-05-25: 0.25 mg via INTRAVENOUS
  Filled 2015-05-25: qty 5

## 2015-05-25 MED ORDER — CYANOCOBALAMIN 1000 MCG/ML IJ SOLN
1000.0000 ug | Freq: Once | INTRAMUSCULAR | Status: AC
  Administered 2015-05-25: 1000 ug via INTRAMUSCULAR
  Filled 2015-05-25: qty 1

## 2015-05-25 MED ORDER — DEXTROSE 5 % IV SOLN
400.0000 mg/m2 | Freq: Once | INTRAVENOUS | Status: AC
  Administered 2015-05-25: 912 mg via INTRAVENOUS
  Filled 2015-05-25: qty 45.6

## 2015-05-25 MED ORDER — OXALIPLATIN CHEMO INJECTION 100 MG/20ML
85.0000 mg/m2 | Freq: Once | INTRAVENOUS | Status: AC
  Administered 2015-05-25: 195 mg via INTRAVENOUS
  Filled 2015-05-25: qty 39

## 2015-05-25 NOTE — Progress Notes (Signed)
Erik [name] presents today for injection per MD orders. B12 1000 mcg administered IM in right Upper Arm. Administration without incident. Patient tolerated well.   Tolerated chemo well. Continuous infusion pump intact. Ambulatory on discharge home to self.

## 2015-05-25 NOTE — Patient Instructions (Addendum)
[place] at [facility] Discharge Instructions  RECOMMENDATIONS MADE BY THE CONSULTANT AND ANY TEST RESULTS WILL BE SENT TO YOUR REFERRING PHYSICIAN.   Exam and discussion by Dr Whitney Muse today Chemotherapy today Return Thursday to have pumped removed  Potassium is a little low, re-start taking your potassium  Potassium today Return to see the doctor in 2 weeks with chemotherapy  Please call the clinic if you have any questions or concerns     Thank you for choosing [place] at [facility] to provide your oncology and hematology care.  To afford each patient quality time with our provider, please arrive at least 15 minutes before your scheduled appointment time.   Beginning January 23rd 2017 lab work for the [facility] will be done in the  Main lab at [facility] on 1st floor. If you have a lab appointment with the [facility] please come in thru the  Main Entrance and check in at the main information desk  You need to re-schedule your appointment should you arrive 10 or more minutes late.  We strive to give you quality time with our providers, and arriving late affects you and other patients whose appointments are after yours.  Also, if you no show three or more times for appointments you may be dismissed from the clinic at the providers discretion.     Again, thank you for choosing [facility].  Our hope is that these requests will decrease the amount of time that you wait before being seen by our physicians.       _____________________________________________________________  Should you have questions after your visit to [facility], please contact our office at [phone] between the hours of 8:30 a.m. and 4:30 p.m.  Voicemails left after 4:30 p.m. will not be returned until the following business day.  For prescription refill requests, have your pharmacy contact our office.         Resources For  Cancer Patients and their Caregivers ? American Cancer Society: Can assist with transportation, wigs, general needs, runs Look Good Feel Better.        (519)081-6306 ? Cancer Care: Provides financial assistance, online support groups, medication/co-pay assistance.  1-800-813-HOPE 430-075-3442) ? [place] Assists [place] cancer patients and their families through emotional , educational and financial support.  845 670 3234 ? [place] DSS Where to apply for food stamps, Medicaid and utility assistance. 364-588-7988 ? [employer]: Transportation to medical appointments. 910-337-3048 ? Social Security Administration: May apply for disability if have a Stage IV cancer. 208-287-7917 (782) 664-8797 ? [employer], Disability and Transit Services: Assists with nutrition, care and transit needs. 386-251-2595

## 2015-05-25 NOTE — Progress Notes (Signed)
[facility] NOTE  Patient Care Team: Erik Dryer, PA-C as PCP - General (Physician Assistant) Erik Binder, MD as Consulting Physician (Gastroenterology)  CHIEF COMPLAINTS/PURPOSE OF CONSULTATION:  [phone] adenocarcinoma of the terminal ileum, Stage III Peri-neural and LVI noted, 4/19 nodes involved Colonoscopy on 01/12/2015 with Dr Erik [name] showing mass at the splenic flexure EGD on 01/12/2015 with moderate erosive gastritis from ASA use CEA 01/12/2015 at 6.6 ng/ml Extended R hemicolectomy with Dr. Adonis [name] on 02/10/2015   HISTORY OF PRESENTING ILLNESS:  Erik [name] 60 y.o. male is here on referral from Dr. Adonis [name] for follow-up of stage III CRC.  Erik [name] returns to the [facility] alone today. He is in a treatment room in the back. He has low potassium today, and remarks that he was on potassium for a few weeks, which he is ok with resuming.  He is on new blood pressure medicine. He also notes that his cold induced sensitivity has lasted longer after his last treatment. Erik [name] notes that the sensitivity cleared up Sunday, but that this time it started sooner and lasted longer. He notes that it wasn't more intense or anything; but this time it started the day of chemo and lasted until Sunday. He knows that this is going to be the pattern and that he needs to talk about it each time he comes in. He remarks that it got better as time went on, but it's still noticeable.  In terms of eating, he says he's eating okay, noting "I don't have the appetite I did have, but I'm still eating, so I'm all right." He says "I just don't seem to be hungry like I used to, but that's okay." Other than that, he says he's "basically the same." he was reminded that his appetite will come back and he will feel good again.  He notes that his main worry is "will I be like this from now on?" and he was reassured that this is not going to be the case. He says it's  just kind of messing with his mind a little bit. He was reminded to remember that this is temporary, and each week he gets closer to being done and reaching the finish date. He notes that he was trying to explain that when you get physically sick, you get mentally sick too, which "kind of compounds the whole deal." In spite of this, he says he keeps telling himself that it's temporary and that it will go away soon. He says he has a Erik [name] coming in today to speak with him.  He denies nausea, vomiting, or significant change in his bowels. Denies numbness in his fingers and toes.     Adenocarcinoma of transverse colon (Erik [name])   01/12/2015 Pathologic Stage Colon, biopsy, distal transverse - TUBULOVILLOUS ADENOMA WITH HIGH GRADE DYSPLASIA.   01/12/2015 Procedure Colonoscopy by Dr. Oneida [name].   01/12/2015 Tumor Marker CEA: 6.6 (H)    01/18/2015 Imaging CT abd/pelvis- Apple-core lesion identified in the mid transverse colon without obstruction. No evidence for lymphadenopathy in the gastrohepatic ligament or omentum.  Stable 8 mm hypo attenuating lesion in the left liver, likely a cyst.   02/10/2015 Initial Diagnosis Adenocarcinoma of transverse colon (Erik [name])   02/12/2015 Definitive Surgery Erik [name], Extended right hemicolectomy    02/12/2015 Pathology Results Mucinous adenocarcinoma with penetration of visceral peritoneum, 4/19 lymph nodes for metasattic disease, negative resection margins, with LVI and perineural invasion   03/30/2015 -  Chemotherapy FOLFOX     MEDICAL HISTORY:  Past Medical History  Diagnosis Date  . Head trauma 2001    closed head injury; coma for 4 weeks  . Depression   . Abnormal stress echocardiogram   . Hypercholesterolemia   . Hypertension   . Alcohol abuse     Heavy Use up until 2010  . GERD (gastroesophageal reflux disease)   . PTSD (post-traumatic stress disorder)   . Anxiety   . Adenocarcinoma of transverse colon (Erik [name]) 02/10/2015   Ended up with the  cane due to severe brain trauma and head injury in '01 Fell off of a piece of equipment at work and his head was the first thing to hit the concrete floor No one knows the origin of the fall He was in a coma for four and a half weeks Permanent balance/equilibrium problem as a result Carries the cane so that if he starts to lose his balance, he won't fall Used to fall pretty frequently; had 2-3 concussions from blacking out and falling against brick walls or concrete  Last year to year and a half, he says it's gotten better Hasn't blacked out, but does fall every once in a while Says this mostly happens after he's been sitting or laying for a while  Example: If he gets up to use the bathroom in the middle of the night, he will stumble and fall against the wall He denies any problems with neuropathy, saying he will get dizzy in his head He says sometimes he knows when it's coming on, and sometimes he won't His vision gets a little blurry too Works with neurologist Erik [name] at [facility] in [place]: Past Surgical History  Procedure Laterality Date  . Kidney surgery      >30 years ago  . Cardiac cath    . Craniotomy  2001  . Head injury surgery    . Colonoscopy  2011    Dr. Oneida [name]: multiple adenomas and hyperplastic polyps  . Colonoscopy with propofol N/A 01/12/2015    Procedure: COLONOSCOPY WITH PROPOFOL;  Surgeon: Erik Binder, MD;  [facility]: [facility];  Service: Endoscopy;  Laterality: N/A;  1030  . Esophagogastroduodenoscopy (egd) with propofol N/A 01/12/2015    Procedure: ESOPHAGOGASTRODUODENOSCOPY (EGD) WITH PROPOFOL;  Surgeon: Erik Binder, MD;  Location: [facility];  Service: Endoscopy;  Laterality: N/A;  . Hernia repair Right 2012    Inguinal- Forestine Na  . Laparoscopic right hemi colectomy Left 02/10/2015    Procedure: LAPAROSCOPIC THEN OPEN RIGHT HEMI COLECTOMY;  Surgeon: Erik Pert, MD;  Location: [facility];  Service: General;  Laterality:  Left;  . Portacath placement N/A 03/24/2015    Procedure: INSERTION PORT-A-CATH;  Surgeon: Jules Husbands, MD;  Location: [facility];  Service: General;  Laterality: N/A;    SOCIAL HISTORY: Social History   Social History  . Marital Status: Divorced    Spouse Name: N/A  . Number of Children: N/A  . Years of Education: N/A   Occupational History  . Full time    Social History Main Topics  . Smoking status: Former Smoker -- 0.25 packs/day    Types: Cigarettes    Quit date: 02/10/2015  . Smokeless tobacco: Never Used     Comment: patient is aware he needs to quit  . Alcohol Use: 0.0 oz/week    0 Standard drinks or equivalent per week     Comment: beer occ, history of ETOH abuse in remote  past.   . Drug Use: Yes    Special: Marijuana     Comment: marijuana last 2012  . Sexual Activity:    Partners: Female   Other Topics Concern  . 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 Ataxia Neg Hx   . Chorea Neg Hx   . Dementia Neg Hx   . Mental retardation Neg Hx   . Migraines Neg Hx   . Multiple sclerosis Neg Hx   . Neurofibromatosis Neg Hx   . Neuropathy Neg Hx   . Parkinsonism Neg Hx   . Seizures Neg Hx   . Stroke Neg Hx   . Colon cancer Neg Hx   . Pulmonary embolism Mother   . Breast cancer Mother   . Heart disease  Father   . Cancer Father 28    Leukemia  . Cancer Mother   . Cancer Paternal Grandfather     Lung   indicated that his mother is deceased. He indicated that his father is deceased.   Mother and father both deceased Mother was 55; died of blood clots in her legs; pulmonary embolism Had a hip replacement and "a bunch of other stuff;" says it's a long complicated story Says "a woman got power of attorney over his mother and they stopped her physical therapy and put her in a nursing home, and due to her inactivity she got blood clots" Father ws 45, died of heart failure No siblings  ALLERGIES:  has No Known Allergies.  MEDICATIONS:  Current Outpatient Prescriptions  Medication Sig Dispense Refill  . albuterol (PROVENTIL HFA;VENTOLIN HFA) 108 (90 BASE) MCG/ACT inhaler Inhale 2 puffs into the lungs every 6 (six) hours as needed for wheezing or shortness of breath. 1 Inhaler 1  . amitriptyline (ELAVIL) 25 MG tablet Take 1 tablet (25 mg total) by mouth at bedtime. 30 tablet 2  . atorvastatin (LIPITOR) 20 MG tablet Take 1 tablet (20 mg total) by mouth daily. (Patient not taking: Reported on 05/25/2015) 90 tablet 1  . Cyanocobalamin (B-12 COMPLIANCE INJECTION) 1000 MCG/ML KIT Inject as directed.    Marland Kitchen dexlansoprazole (DEXILANT) 60 MG capsule Take 1 capsule (60 mg total) by mouth daily. 90 capsule 1  . fludrocortisone (FLORINEF) 0.1 MG tablet Take 1 tablet (0.1 mg total) by mouth daily. 30 tablet 3  . fluorouracil CALGB 62035 in sodium chloride 0.9 % 150 mL Inject into the vein. Reported on 03/22/2015    . FLUoxetine (PROZAC) 20 MG capsule Take 20 mg by mouth 2 (two) times daily.     Marland Kitchen HYDROcodone-acetaminophen (NORCO) 5-325 MG tablet Take 1 tablet by mouth every 6 (six) hours as needed for moderate pain. (Patient not taking: Reported on 05/25/2015) 60 tablet 0  . leucovorin in dextrose 5 % 250 mL Inject  into the vein every 14 (fourteen) days. Reported on 03/22/2015    . lidocaine-prilocaine (EMLA)  cream Apply a quarter size amount to port site 1 hour prior to chemo. Do not rub in. Cover with plastic wrap. 30 g 3  . OXALIPLATIN IV Inject into the vein every 14 (fourteen) days. Reported on 03/22/2015    . potassium chloride SA (K-DUR,KLOR-CON) 20 MEQ tablet Take 1 tablet (20 mEq total) by mouth daily. (Patient not taking: Reported on 05/25/2015) 30 tablet 3  . rosuvastatin (CRESTOR) 40 MG tablet Take 1 tablet (40 mg total) by mouth daily. 90 tablet 1  . sildenafil (VIAGRA) 100 MG tablet Take 0.5-1 tablets (50-100 mg total) by mouth daily as needed for erectile dysfunction. 10 tablet 1   No current facility-administered medications for this visit.   Facility-Administered Medications Ordered in Other Visits  Medication Dose Route Frequency Provider Last Rate Last Dose  . cyanocobalamin ((VITAMIN B-12)) injection 1,000 mcg  1,000 mcg Intramuscular Once Thomas S Kefalas, PA-C      . dexamethasone (DECADRON) 10 mg in sodium chloride 0.9 % 50 mL IVPB  10 mg Intravenous Once Patrici Ranks, MD      . dextrose 5 % solution   Intravenous Once Patrici Ranks, MD      . palonosetron (ALOXI) injection 0.25 mg  0.25 mg Intravenous Once Patrici Ranks, MD      . sodium chloride flush (NS) 0.9 % injection 10 mL  10 mL Intracatheter PRN Patrici Ranks, MD        Review of Systems  Constitutional: Positive for fatigue. Negative for fever, chills, weight loss and malaise/fatigue.     Walks with cane due to traumatic head injury in the past. Gets tired.  HENT: Negative.  Negative for congestion, hearing loss, nosebleeds, sore throat and tinnitus.   Eyes: Negative.  Negative for blurred vision, double vision, pain and discharge.  Respiratory: Negative.  Negative for cough, hemoptysis, sputum production, shortness of breath and wheezing.   Cardiovascular: Negative.  Negative for chest pain, palpitations, claudication, leg swelling and PND.  Gastrointestinal: Positive for diarrhea, since surgery,  unchanged. Negative for heartburn, nausea, vomiting, abdominal pain, constipation, blood in stool and melena.  Genitourinary: Negative.  Negative for dysuria, urgency, frequency and hematuria.  Musculoskeletal: Positive for falls. Negative for myalgias and joint pain.       Due to traumatic head injury in the past.  Skin: Negative.  Negative for itching and rash.  Neurological: Negative.  Negative for dizziness, tingling, tremors, sensory change, speech change, focal weakness, seizures, loss of consciousness, weakness and headaches.       Traumatic head injury in the past.  Endo/Heme/Allergies: Negative.  Does not bruise/bleed easily.  Psychiatric/Behavioral: Negative.  Negative for depression, suicidal ideas, memory loss and substance abuse. The patient is not nervous/anxious and does not have insomnia.   All other systems reviewed and are negative.  14 point ROS was done and is otherwise as detailed above or in HPI   PHYSICAL EXAMINATION: ECOG PERFORMANCE STATUS: 0 - Asymptomatic  Vitals with BMI 05/11/2015  Height   Weight 228 lbs  BMI   Systolic 671  Diastolic 88  Pulse 92  Respirations 20    Physical Exam  Constitutional: He is oriented to person, place, and time and well-developed, well-nourished, and in no distress.  HENT:  Head: Normocephalic and atraumatic.  Nose: Nose normal.  Mouth/Throat: Oropharynx is clear and moist. No oropharyngeal exudate.  Eyes: Conjunctivae  and EOM are normal. Pupils are equal, round, and reactive to light. Right eye exhibits no discharge. Left eye exhibits no discharge. No scleral icterus.  Neck: Normal range of motion. Neck supple. No tracheal deviation present. No thyromegaly present.  Cardiovascular: Normal rate, regular rhythm and normal heart sounds.  Exam reveals no gallop and no friction rub.   No murmur heard. Pulmonary/Chest: Effort normal and breath sounds normal. He has no wheezes. He has no rales. Port site C/D/I Abdominal: Soft.  Bowel sounds are normal. He exhibits no distension and no mass. There is no tenderness. There is no rebound and no guarding.  Musculoskeletal: Normal range of motion. He exhibits no edema.  Lymphadenopathy:    He has no cervical adenopathy.  Neurological: He is alert and oriented to person, place, and time. No cranial nerve deficit. Coordination normal. (cane) Skin: Skin is warm and dry. No rash noted.  Psychiatric: Mood, memory, affect and judgment normal.  Nursing note and vitals reviewed.   LABORATORY DATA:  I have reviewed the data as listed Lab Results  Component Value Date   WBC 7.0 05/25/2015   HGB 12.8* 05/25/2015   HCT 37.2* 05/25/2015   MCV 86.9 05/25/2015   PLT 123* 05/25/2015   CMP     Component Value Date/Time   NA 140 05/25/2015 0930   K 3.3* 05/25/2015 0930   CL 104 05/25/2015 0930   CO2 26 05/25/2015 0930   GLUCOSE 111* 05/25/2015 0930   BUN 8 05/25/2015 0930   CREATININE 0.77 05/25/2015 0930   CREATININE 0.86 12/16/2014 0844   CALCIUM 8.8* 05/25/2015 0930   PROT 7.0 05/25/2015 0930   ALBUMIN 3.6 05/25/2015 0930   AST 20 05/25/2015 0930   ALT 16* 05/25/2015 0930   ALKPHOS 69 05/25/2015 0930   BILITOT 0.6 05/25/2015 0930   GFRNONAA >60 05/25/2015 0930   GFRNONAA >89 12/16/2014 0844   GFRAA >60 05/25/2015 0930   GFRAA >89 12/16/2014 0844    PATHOLOGY Surgical Pathology  CASE: [id number]  PATIENT: Hollice Espy Corbitt  Surgical Pathology Report   SPECIMEN SUBMITTED:  A. Peritoneal biopsy  B. Extended hemicolectomy, right   CLINICAL HISTORY:  Potential metastatic colon cancer; Tubular adenoma of colon   PRE-OPERATIVE DIAGNOSIS:    POST-OPERATIVE DIAGNOSIS:    DIAGNOSIS:  A. PERITONEUM; BIOPSY:  - FIBROADIPOSE TISSUE WITH INFLAMMATION.   B. RIGHT COLON; EXTENDED RIGHT HEMICOLECTOMY:  - MUCINOUS ADENOCARCINOMA WITH PENETRATION OF THE VISCERAL PERITONEUM.  - SEPARATE TUBULAR ADENOMA OF THE CECUM.  - APPENDIX WITHIN NORMAL LIMITS.  - FOUR OF  NINETEEN LYMPH NODES INVOLVED BY METASTATIC CARCINOMA (4/19).  - THE MARGINS OF RESECTION ARE NEGATIVE.  - SEE SUMMARY BELOW.   Note:  Ample material is available for MSI and other ancillary testing.    COLON AND RECTUM: Resection, Including Transanal Disk Excision of Rectal  Neoplasms  Specimens InvolvedB: Extended hemicolectomy, right   Colon and Rectum, Resection Cancer Case Summary  SPECIMEN  Specimen: Terminal ileum  Cecum  Appendix  Ascending colon  Procedure:   Right hemicolectomy  Primary Tumor Site: Right (ascending) colon  Additional Sites Involved by Tumor:   None identified  Macroscopic Tumor Perforation:   Cannot be determined  unspecified  TUMOR  Histologic Type:  Mucinous adenocarcinoma  Histologic Grade:  Other (specify)  Defer to optional MSI testing for grade assignment: Mucinous  adenocarcinoma is no longer considered definitionally high grade. MSI-H  carcinomas are low grade while MSS or MSI-L carcinomas are high grade  EXTENT  Tumor Size:  Greatest dimension (cm)  4.8cm  Microscopic Tumor Extension: Tumor penetrates to the surface of the  visceral peritoneum (serosa)  MARGINS  Proximal Margin:  Uninvolved by invasive carcinoma  - No adenoma or intraepithelial neoplasia / dysplasia identified  Distal Margin: Uninvolved by invasive carcinoma  - No adenoma or intraepithelial neoplasia / dysplasia identified  Circumferential (Radial) or Mesenteric Margin:  Uninvolved by invasive  carcinoma  Deep Margin:  n/a  ACCESSORY FINDINGS  Lymph-Vascular Invasion: Present  Perineural Invasion:   Present  Tumor Deposits:   Not identified  not identified  STAGE (pTNM)  TNM Descriptors:  n/a  Primary Tumor (pT): pT4a: Tumor penetrates the visceral peritoneum  Regional Lymph Nodes (pN)  pN2a: Metastasis in 4 to 6 regional lymph nodes  Number of Lymph Nodes Examined:  Specify  19  Number of Lymph Nodes Involved:  Specify  4  Distant  Metastasis (pM): Not applicable    GROSS DESCRIPTION:  A. Intra Operative Consultation:    Received: Fresh    Specimen: Peritoneal biopsy    Pathologic Evaluation: Frozen section    Diagnosis: Frozen section A1 Peritoneal Biopsy: Grossly fatty  specimen. Fibrosis and inflammation. No carcinoma seen.    Communicated to: Dr. Adonis [name] at 2:33 PM on 02/10/2015 by Dr. Luana Shu    Tissue submitted: A1   Labeled: Peritoneal biopsy   Tissue fragment(s): 1   Size: 1.5 x 0.6 x 0.3 cm   Description: Piece of red yellow adipose tissue, sectioned   Entirely submitted in one cassette(s).    B. Labeled: Extended right hemicolectomy  Type of procedure: Right hemicolectomy  Measurement: Terminal ileum 7 cm in length by 5.5 cm in circumference,  appendix 5 cm in length and from 0.5-0.7 cm in diameter, cecum and right  colon measure 33.5 cm in length and range from 7-12.5 cm in  circumference.  Orientation: Serosa inked blue, radial fat margin inked black.  Number of mass(es): 1  Location of mass(es): Ascending colon  Size(s) of mass(es): 4.8 x 4.5 x 1.2 cm  Description of mass(es): Pink red raised with rolled edges firm nodular  mass.  Bowel circumference at site of mass(es): 7.5 cm  Gross depth of invasion: Lesion appears to grossly penetrate through the  wall into the pericolonic fat and is grossly visualized on the serosa.  Macroscopic perforation: Yes  Margins:    Proximal: 26.8 cm  Distal: 5 cm  Circumferential (Radial): 6.5 cm  Mesenteric: Less than 0.1 cm   Luminal obstruction: At least 75% obstruction  Other remarkable findings: Ileocecal valve is somewhat lipomatous. There  is a small raised pink-red polyp located 4.5 cm away from the ileocecal  valve pouch, appears to be confined to the mucosa.  Lymph nodes: Dissection of the pericolonic fat reveals 18 pink red to  dark brown lymph node candidates ranging from 0.3 cm in diameter up to  1.6 x 0.9 x 0.8 cm, some  of which are grossly suspicious for tumor  involvement.  Block summary:  1-terminal ileal margin  2-distal colonic margin  3-radial fat margin  4-7-sections of mass including inked serosa and underlying pericolonic  fat  8-ileocecal valve  9-sections of appendix  10-polyp in cecal pouch  11-12- 5 lymph node candidates per cassette  13- 4 lymph nodes candidates  14-17 1 serially sectioned lymph node per cassette    Final Diagnosis performed by Delorse Lek, MD. Electronically signed  02/12/2015 2:17:44PM   The electronic signature indicates that the named  Attending Pathologist  has evaluated the specimen   Technical component performed at [facility], [address] [place],  [place] [address]  Lab: [phone] Dir: Darrick Penna. Evette Doffing, MD   Professional component performed at [facility], [facility],  [place], [place], [place] [address]  Lab: [phone] Dir: Dellia Nims. Reuel Derby, MD    RADIOGRAPHIC STUDIES: I have personally reviewed the radiological images as listed and agreed with the findings in the report.  03/15/2015 Study Result     CLINICAL DATA: Transverse colon adenocarcinoma status post right hemicolectomy on 02/10/2015, presenting for chest staging.  EXAM: CT CHEST WITH CONTRAST  TECHNIQUE: Multidetector CT imaging of the chest was performed during intravenous contrast administration.  CONTRAST: 55m OMNIPAQUE IOHEXOL 300 MG/ML SOLN  COMPARISON: 02/17/2010 chest CT. 02/13/2015 chest radiograph.  FINDINGS: Mediastinum/Nodes: Normal heart size. No pericardial fluid/thickening. Left anterior descending coronary atherosclerosis. Great vessels are normal in course and caliber. No central pulmonary emboli. Normal visualized thyroid. Normal esophagus. No pathologically enlarged axillary, mediastinal or hilar lymph nodes.  Lungs/Pleura: No pneumothorax. No pleural effusion. Mild centrilobular emphysema and diffuse bronchial wall  thickening. There is a subsolid 1.1 x 1.1 cm left upper lobe pulmonary nodule (series 3/ image 30), new since 02/17/2010. There is patchy centrilobular nodularity throughout both lungs, upper lobe predominant, not appreciably changed since 02/17/2010. No acute consolidative airspace disease or additional significant pulmonary nodules.  Upper abdomen: Unremarkable.  Musculoskeletal: No aggressive appearing focal osseous lesions. Mild degenerative changes are seen in the thoracic spine.  IMPRESSION: 1. Subsolid 1.1 cm left upper lobe pulmonary nodule, new since 02/17/2010, indeterminate (favor either a focal irregular scar or primary lung neoplasm, unlikely to represent a metastasis). Initial follow-up by chest CT without contrast is recommended in 3 months to confirm persistence. This recommendation follows the consensus statement: Recommendations for the Management of Subsolid Pulmonary Nodules Detected at CT: A Statement from the [employer] published in Radiology 2013; 266:304-317. 2. Patchy upper lobe predominant centrilobular nodularity throughout both lungs, not appreciably changed since 02/17/2010, likely representing smoking related interstitial lung disease (respiratory bronchiolitis if the patient is asymptomatic or respiratory bronchiolitis - interstitial lung disease (RB-ILD) if the patient is symptomatic). 3. Mild centrilobular emphysema and diffuse bronchial wall thickening, suggesting COPD. 4. Coronary atherosclerosis.   Electronically Signed  By: JIlona SorrelM.D.  On: 03/15/2015 09:38    01/18/2015   CLINICAL DATA: Subsequent encounter for colon mass found on colonoscopy last week.  EXAM: CT ABDOMEN AND PELVIS WITH CONTRAST  TECHNIQUE: Multidetector CT imaging of the abdomen and pelvis was performed using the standard protocol following bolus administration of intravenous contrast.  CONTRAST: 1081mOMNIPAQUE IOHEXOL 300 MG/ML  SOLN  COMPARISON: 09/28/2009  FINDINGS: Lower chest: Unremarkable.  Hepatobiliary: 8 mm hypo attenuating lesion in the anterior left liver is stable. There is no evidence for gallstones, gallbladder wall thickening, or pericholecystic fluid. No intrahepatic or extrahepatic biliary dilation.  Pancreas: No focal mass lesion. No dilatation of the main duct. No intraparenchymal cyst. No peripancreatic edema.  Spleen: No splenomegaly. No focal mass lesion.  Adrenals/Urinary Tract: No adrenal nodule or mass. Kidneys are unremarkable. No evidence for hydroureter. The urinary bladder appears normal for the degree of distention.  Stomach/Bowel: Stomach is nondistended. No gastric wall thickening. No evidence of outlet obstruction. Duodenum is normally positioned as is the ligament of Treitz. No small bowel wall thickening. No small bowel dilatation. The terminal ileum is normal. The appendix is normal. Apple-core lesion identified in the  mid transverse colon without obstruction.  Vascular/Lymphatic: There is abdominal aortic atherosclerosis without aneurysm. No gastrohepatic or hepatoduodenal ligament lymphadenopathy. No retroperitoneal lymphadenopathy. No definite lymphadenopathy adjacent to the transverse colon lesion. No evidence for lymphadenopathy in the gastrohepatic ligament or omentum.  Reproductive: Prostate gland is mildly enlarged with central dystrophic calcification  Other: No intraperitoneal free fluid.  Musculoskeletal: Bone windows reveal no worrisome lytic or sclerotic osseous lesions.  IMPRESSION: 1. Apple-core lesion identified in the mid transverse colon without obstruction. No evidence for lymphadenopathy in the gastrohepatic ligament or omentum. 2. Stable 8 mm hypo attenuating lesion in the left liver, likely a cyst.   Electronically Signed  By: Misty Stanley M.D.  On: 01/18/2015 11:23  ASSESSMENT & PLAN:  [id number] adenocarcinoma  of the terminal ileum, Stage III Peri-neural and LVI noted, 4/19 nodes involved Colonoscopy on 01/12/2015 with Dr Erik [name] showing mass at the splenic flexure EGD on 01/12/2015 with moderate erosive gastritis from ASA use CEA 01/12/2015 at 6.6 ng/ml Extended R hemicolectomy with Dr. Adonis [name] on 02/10/2015 1.1 cm LUL pulmonary nodule B12 deficiency 149 pg/ml   Adenocarcinoma of transverse colon ([place]) Stage IIIC ([id number]) adenocarcinoma of transverse colon, S/P definitive surgery with a right extended hemicolectomy by Dr. Clayburn [name].  Now on systemic adjuvant therapy consisting of FOLFOX.  Oncology history is up to date.  Return in 2 week with pre-treatment labs: CBC diff, CMET  Return in 2 weeks for follow-up and next cycle of FOLFOX. He is doing well with therapy. Currently no symptoms of neuropathy. 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  B12 deficiency Continue B12 monthly for now. Will eventually try OTC oral B12 with close observation in future.   H/O Depression  He is setting up a meeting with his therapist.  We discussed the temporary nature of his current therapy. He notes that overall he is doing well but admits he needs to see his counselor for ongoing support.   Hypokalemia  He will receive a one time dose of potassium today, and should resume taking potassium at home. He does not need any refills today.  He will return in two weeks. We will keep an eye on his neuropathy and his mental health status.  All questions were answered. The patient knows to call the clinic with any problems, questions or concerns.  This document serves as a record of services personally performed by Ancil Linsey, MD. It was created on her behalf by Toni Amend, a trained medical scribe. The creation of this record is based on the scribe's personal observations and the provider's statements to them. This document has been checked and approved by  the attending provider.  I have reviewed the above documentation for accuracy and completeness and I agree with the above.  This note was electronically signed.  Molli Hazard, MD  05/25/2015 10:03 AM

## 2015-05-25 NOTE — Patient Instructions (Signed)
[facility] Discharge Instructions for Patients Receiving Chemotherapy   Beginning January 23rd 2017 lab work for the [facility] will be done in the  Main lab at [facility] on 1st floor. If you have a lab appointment with the [facility] please come in thru the  Main Entrance and check in at the main information desk   Today you received the following chemotherapy agents Oxaliplatin, Leucovorin and 5FU pump. You also received a vitamin B12 injection today.  To help prevent nausea and vomiting after your treatment, we encourage you to take your nausea medication as instructed.   If you develop nausea and vomiting, or diarrhea that is not controlled by your medication, call the clinic.  The clinic phone number is [phone]. Office hours are Monday-Friday 8:30am-5:00pm.  BELOW ARE SYMPTOMS THAT SHOULD BE REPORTED IMMEDIATELY:  *FEVER GREATER THAN 101.0 F  *CHILLS WITH OR WITHOUT FEVER  NAUSEA AND VOMITING THAT IS NOT CONTROLLED WITH YOUR NAUSEA MEDICATION  *UNUSUAL SHORTNESS OF BREATH  *UNUSUAL BRUISING OR BLEEDING  TENDERNESS IN MOUTH AND THROAT WITH OR WITHOUT PRESENCE OF ULCERS  *URINARY PROBLEMS  *BOWEL PROBLEMS  UNUSUAL RASH Items with * indicate a potential emergency and should be followed up as soon as possible. If you have an emergency after office hours please contact your primary care physician or go to the nearest emergency department.  Please call the clinic during office hours if you have any questions or concerns.   You may also contact the Patient Navigator at [phone] should you have any questions or need assistance in obtaining follow up care.  Resources For Cancer Patients and their Caregivers ? American Cancer Society: Can assist with transportation, wigs, general needs, runs Look Good Feel Better.        724 087 4425 ? Cancer Care: Provides financial assistance, online support groups, medication/co-pay assistance.   1-800-813-HOPE 216-108-3892) ? [place] Assists [place] cancer patients and their families through emotional , educational and financial support.  312-254-3003 ? [place] DSS Where to apply for food stamps, Medicaid and utility assistance. 570-793-8613 ? RCATS: Transportation to medical appointments. (938)435-5119 ? Social Security Administration: May apply for disability if have a Stage IV cancer. 215-502-5317 3518644997 ? [employer], Disability and Transit Services: Assists with nutrition, care and transit needs. [phone]

## 2015-05-25 NOTE — Progress Notes (Signed)
[facility] Clinical Social Work  Clinical Social Work was referred by [facility] rounding. CSW met briefly with patient for re-assessment of psychosocial needs. Clinical Social Worker educated pt on upcoming painting class for 4/25. Pt declined. He states he is doing well and denies needs currently. Pt shared his system to keep track of all of his appointments. Pt aware to reach out as needs arise.  Clinical Social Work interventions: Check in  [place], [place] Tuesdays   [phone]

## 2015-05-27 ENCOUNTER — Encounter (HOSPITAL_BASED_OUTPATIENT_CLINIC_OR_DEPARTMENT_OTHER): Payer: Medicaid Other

## 2015-05-27 ENCOUNTER — Encounter (HOSPITAL_COMMUNITY): Payer: Self-pay

## 2015-05-27 VITALS — BP 128/76 | HR 84 | Temp 98.0°F | Resp 18

## 2015-05-27 DIAGNOSIS — Z452 Encounter for adjustment and management of vascular access device: Secondary | ICD-10-CM | POA: Diagnosis present

## 2015-05-27 DIAGNOSIS — C184 Malignant neoplasm of transverse colon: Secondary | ICD-10-CM

## 2015-05-27 MED ORDER — HEPARIN SOD (PORK) LOCK FLUSH 100 UNIT/ML IV SOLN
500.0000 [IU] | Freq: Once | INTRAVENOUS | Status: AC | PRN
  Administered 2015-05-27: 500 [IU]

## 2015-05-27 MED ORDER — SODIUM CHLORIDE 0.9% FLUSH
10.0000 mL | INTRAVENOUS | Status: DC | PRN
  Administered 2015-05-27: 10 mL
  Filled 2015-05-27: qty 10

## 2015-05-27 NOTE — Progress Notes (Signed)
DENNIS SIGNS returns today for port de access and flush after 46 hr continous infusion of 71fu. Tolerated infusion without problems. Portacath located  chest wall was  deaccessed and flushed with 11ml NS and 500U/57ml Heparin and needle removed intact. Patient c/o cold neuropathy being a little worse this cycle with some numbness in his hands. Denies nausea or vomiting. Procedure without incident. Patient tolerated procedure well.

## 2015-06-08 ENCOUNTER — Ambulatory Visit (HOSPITAL_COMMUNITY): Payer: Medicaid Other | Admitting: Oncology

## 2015-06-08 ENCOUNTER — Encounter (HOSPITAL_COMMUNITY): Payer: Medicaid Other | Admitting: Hematology & Oncology

## 2015-06-08 ENCOUNTER — Inpatient Hospital Stay (HOSPITAL_COMMUNITY): Payer: Medicaid Other

## 2015-06-08 ENCOUNTER — Encounter (HOSPITAL_COMMUNITY): Payer: Medicaid Other | Attending: Hematology & Oncology

## 2015-06-08 ENCOUNTER — Encounter (HOSPITAL_COMMUNITY): Payer: Self-pay | Admitting: Hematology & Oncology

## 2015-06-08 ENCOUNTER — Ambulatory Visit (HOSPITAL_COMMUNITY): Payer: Medicaid Other | Admitting: Hematology & Oncology

## 2015-06-08 DIAGNOSIS — Z823 Family history of stroke: Secondary | ICD-10-CM | POA: Diagnosis not present

## 2015-06-08 DIAGNOSIS — I10 Essential (primary) hypertension: Secondary | ICD-10-CM | POA: Diagnosis not present

## 2015-06-08 DIAGNOSIS — Z5111 Encounter for antineoplastic chemotherapy: Secondary | ICD-10-CM | POA: Insufficient documentation

## 2015-06-08 DIAGNOSIS — Z809 Family history of malignant neoplasm, unspecified: Secondary | ICD-10-CM | POA: Insufficient documentation

## 2015-06-08 DIAGNOSIS — E7800 Pure hypercholesterolemia, unspecified: Secondary | ICD-10-CM | POA: Diagnosis not present

## 2015-06-08 DIAGNOSIS — F4310 Post-traumatic stress disorder, unspecified: Secondary | ICD-10-CM | POA: Diagnosis not present

## 2015-06-08 DIAGNOSIS — C189 Malignant neoplasm of colon, unspecified: Secondary | ICD-10-CM | POA: Insufficient documentation

## 2015-06-08 DIAGNOSIS — K219 Gastro-esophageal reflux disease without esophagitis: Secondary | ICD-10-CM | POA: Diagnosis not present

## 2015-06-08 DIAGNOSIS — C184 Malignant neoplasm of transverse colon: Secondary | ICD-10-CM

## 2015-06-08 DIAGNOSIS — Z8249 Family history of ischemic heart disease and other diseases of the circulatory system: Secondary | ICD-10-CM | POA: Insufficient documentation

## 2015-06-08 DIAGNOSIS — E538 Deficiency of other specified B group vitamins: Secondary | ICD-10-CM | POA: Diagnosis present

## 2015-06-08 DIAGNOSIS — Z9889 Other specified postprocedural states: Secondary | ICD-10-CM | POA: Diagnosis not present

## 2015-06-08 LAB — CBC WITH DIFFERENTIAL/PLATELET
BASOS ABS: 0 10*3/uL (ref 0.0–0.1)
Basophils Relative: 1 %
Eosinophils Absolute: 0.1 10*3/uL (ref 0.0–0.7)
Eosinophils Relative: 1 %
HEMATOCRIT: 35 % — AB (ref 39.0–52.0)
Hemoglobin: 11.8 g/dL — ABNORMAL LOW (ref 13.0–17.0)
LYMPHS PCT: 22 %
Lymphs Abs: 1.2 10*3/uL (ref 0.7–4.0)
MCH: 30.1 pg (ref 26.0–34.0)
MCHC: 33.7 g/dL (ref 30.0–36.0)
MCV: 89.3 fL (ref 78.0–100.0)
MONO ABS: 0.8 10*3/uL (ref 0.1–1.0)
Monocytes Relative: 15 %
NEUTROS ABS: 3.3 10*3/uL (ref 1.7–7.7)
Neutrophils Relative %: 61 %
Platelets: 156 10*3/uL (ref 150–400)
RBC: 3.92 MIL/uL — AB (ref 4.22–5.81)
RDW: 17.2 % — ABNORMAL HIGH (ref 11.5–15.5)
WBC: 5.5 10*3/uL (ref 4.0–10.5)

## 2015-06-08 LAB — COMPREHENSIVE METABOLIC PANEL
ALBUMIN: 3.4 g/dL — AB (ref 3.5–5.0)
ALT: 13 U/L — ABNORMAL LOW (ref 17–63)
ANION GAP: 9 (ref 5–15)
AST: 18 U/L (ref 15–41)
Alkaline Phosphatase: 59 U/L (ref 38–126)
BILIRUBIN TOTAL: 0.4 mg/dL (ref 0.3–1.2)
BUN: 8 mg/dL (ref 6–20)
CO2: 30 mmol/L (ref 22–32)
Calcium: 8.9 mg/dL (ref 8.9–10.3)
Chloride: 104 mmol/L (ref 101–111)
Creatinine, Ser: 0.64 mg/dL (ref 0.61–1.24)
GFR calc Af Amer: >60 mL/min (ref >60)
GFR calc non Af Amer: >60 mL/min (ref >60)
GLUCOSE: 85 mg/dL (ref 65–99)
POTASSIUM: 3.5 mmol/L (ref 3.5–5.1)
Sodium: 143 mmol/L (ref 135–145)
TOTAL PROTEIN: 7 g/dL (ref 6.5–8.1)

## 2015-06-08 MED ORDER — HEPARIN SOD (PORK) LOCK FLUSH 100 UNIT/ML IV SOLN
INTRAVENOUS | Status: AC
  Filled 2015-06-08: qty 5

## 2015-06-08 MED ORDER — SODIUM CHLORIDE 0.9% FLUSH
10.0000 mL | INTRAVENOUS | Status: DC | PRN
  Administered 2015-06-08: 10 mL via INTRAVENOUS
  Filled 2015-06-08: qty 10

## 2015-06-08 MED ORDER — HEPARIN SOD (PORK) LOCK FLUSH 100 UNIT/ML IV SOLN
500.0000 [IU] | Freq: Once | INTRAVENOUS | Status: AC
  Administered 2015-06-08: 500 [IU] via INTRAVENOUS

## 2015-06-08 NOTE — Progress Notes (Signed)
This encounter was created in error - please disregard.

## 2015-06-08 NOTE — Patient Instructions (Signed)
[place] at [facility] Discharge Instructions  RECOMMENDATIONS MADE BY THE CONSULTANT AND ANY TEST RESULTS WILL BE SENT TO YOUR REFERRING PHYSICIAN.  No treatment today. Office visit rescheduled. Return next week as scheduled for chemotherapy and office visit.  [place] will deliver Imodium to your home - take as directed to help control diarrhea. Call the clinic should you have any questions or concerns prior to your next visit.   Thank you for choosing [place] at [facility] to provide your oncology and hematology care.  To afford each patient quality time with our provider, please arrive at least 15 minutes before your scheduled appointment time.   Beginning January 23rd 2017 lab work for the [facility] will be done in the  Main lab at [facility] on 1st floor. If you have a lab appointment with the [facility] please come in thru the  Main Entrance and check in at the main information desk  You need to re-schedule your appointment should you arrive 10 or more minutes late.  We strive to give you quality time with our providers, and arriving late affects you and other patients whose appointments are after yours.  Also, if you no show three or more times for appointments you may be dismissed from the clinic at the providers discretion.     Again, thank you for choosing [facility].  Our hope is that these requests will decrease the amount of time that you wait before being seen by our physicians.       _____________________________________________________________  Should you have questions after your visit to [facility], please contact our office at [phone] between the hours of 8:30 a.m. and 4:30 p.m.  Voicemails left after 4:30 p.m. will not be returned until the following business day.  For prescription refill requests, have your pharmacy contact our office.         Resources For  Cancer Patients and their Caregivers ? American Cancer Society: Can assist with transportation, wigs, general needs, runs Look Good Feel Better.        ([phone] ? Cancer Care: Provides financial assistance, online support groups, medication/co-pay assistance.  1-800-813-HOPE 4196836048) ? [place] Assists [place] cancer patients and their families through emotional , educational and financial support.  (838) 485-7037 ? [place] DSS Where to apply for food stamps, Medicaid and utility assistance. 201-036-4107 ? RCATS: Transportation to medical appointments. (317)413-7090 ? Social Security Administration: May apply for disability if have a Stage IV cancer. 367-394-8010 [phone]) [phone] ? [employer], Disability and Transit Services: Assists with nutrition, care and transit needs. [phone]

## 2015-06-08 NOTE — Progress Notes (Signed)
1000:  Pt reports increased cold sensitivity after receiving last chemotherapy treatment; states the sensation comes on sooner and lasts longer than in the past.  Reports that he has increase in fatigue since receiving last treatment.  Also c/o diarrhea - states he has been having 4-6 episodes of diarrhea per day (pt reports he normally has 2 loose BMs per day).  States imodium has helped relieve his diarrhea in the past, but he has not taken anything for diarrhea due to being unable to afford the medication.    1115:  Dr. Whitney Muse notified of pt c/o fatigue, diarrhea, and increased cold sensitivity. Tx will be held today per MD due to s/s.

## 2015-06-09 ENCOUNTER — Other Ambulatory Visit (HOSPITAL_COMMUNITY): Payer: Self-pay | Admitting: Emergency Medicine

## 2015-06-09 DIAGNOSIS — C184 Malignant neoplasm of transverse colon: Secondary | ICD-10-CM

## 2015-06-09 MED ORDER — HYDROCODONE-ACETAMINOPHEN 5-325 MG PO TABS: 1.0000 | ORAL_TABLET | Freq: Four times a day (QID) | ORAL | Status: DC | PRN

## 2015-06-10 ENCOUNTER — Encounter (HOSPITAL_COMMUNITY): Payer: Medicaid Other

## 2015-06-10 DIAGNOSIS — Z09 Encounter for follow-up examination after completed treatment for conditions other than malignant neoplasm: Secondary | ICD-10-CM

## 2015-06-10 NOTE — Congregational Nurse Program (Signed)
Congregational Nurse Program Note  Date of Encounter: 06/10/2015  Past Medical History: Past Medical History  Diagnosis Date  . Head trauma 2001    closed head injury; coma for 4 weeks  . Depression   . Abnormal stress echocardiogram   . Hypercholesterolemia   . Hypertension   . Alcohol abuse     Heavy Use up until 2010  . GERD (gastroesophageal reflux disease)   . PTSD (post-traumatic stress disorder)   . Anxiety   . Adenocarcinoma of transverse colon ([place]) 02/10/2015    Encounter Details:     CNP Questionnaire - 06/10/15 1630    Patient Demographics   Is this a new or existing patient? Existing   Patient is considered a/an Not Applicable   Race Caucasian/White   Patient Assistance   Location of Patient Assistance [employer] [place]   Patient's financial/insurance status [facility] [employer];Low Income;Medicaid   Uninsured Patient No   Patient referred to apply for the following financial assistance Not Applicable   Food insecurities addressed Not Applicable   Transportation assistance Yes   Assistance securing medications No   Educational health offerings Cancer;Chronic disease   Encounter Details   Primary purpose of visit Chronic Illness/Condition Visit   Was an Emergency Department visit averted? Not Applicable   Does patient have a medical provider? Yes   Patient referred to Not Applicable;Other (comment)  volunteer transportation arranged   Was a mental health screening completed? (GAINS tool) No   Does patient have dental issues? No   Does patient have vision issues? No   Since previous encounter, have you referred patient for abnormal blood pressure that resulted in a new diagnosis or medication change? No   Since previous encounter, have you referred patient for abnormal blood glucose that resulted in a new diagnosis or medication change? No      Follow up with existing client of the [facility]. Client is still undergoing Chemo for colon cancer at  [place] center, however, this week he was unable to receive his treatment due to complications per client. Client contacted RN for medication assistance. He does not have the co-payment for his fludrocortisone. RN called [employer] and updated client's information . With Medicaid this medication will have a co-pay of $3.00. [employer] program will assist with this and referral form faxed to [facility] and medication should be delivered to client's home. Client informed me that he will probably have a disability hearing in August. Reinforced continuing with his appointments at [facility] for his depression and he states understanding. States he is just "feeling really tired right now". Will continue to follow up often with client and as needed for any assistance.

## 2015-06-11 ENCOUNTER — Telehealth: Payer: Self-pay

## 2015-06-11 NOTE — Telephone Encounter (Signed)
Called patient's lawyer's office Jackelyn Poling) to let them know that patient's medical records will need to be obtained from [facility] Records Department. I provided Medical Records phone number: [phone] and fax: [phone]. I told them that I faxed medical records their request.

## 2015-06-15 ENCOUNTER — Encounter (HOSPITAL_BASED_OUTPATIENT_CLINIC_OR_DEPARTMENT_OTHER): Payer: Medicaid Other | Admitting: Hematology & Oncology

## 2015-06-15 ENCOUNTER — Encounter (HOSPITAL_BASED_OUTPATIENT_CLINIC_OR_DEPARTMENT_OTHER): Payer: Medicaid Other

## 2015-06-15 ENCOUNTER — Encounter (HOSPITAL_COMMUNITY): Payer: Self-pay | Admitting: Hematology & Oncology

## 2015-06-15 VITALS — BP 177/82 | HR 97 | Temp 97.9°F | Resp 18

## 2015-06-15 VITALS — BP 152/78 | HR 112 | Temp 97.9°F | Resp 20 | Wt 222.0 lb

## 2015-06-15 DIAGNOSIS — C184 Malignant neoplasm of transverse colon: Secondary | ICD-10-CM | POA: Diagnosis not present

## 2015-06-15 DIAGNOSIS — Z5111 Encounter for antineoplastic chemotherapy: Secondary | ICD-10-CM | POA: Diagnosis present

## 2015-06-15 DIAGNOSIS — E876 Hypokalemia: Secondary | ICD-10-CM

## 2015-06-15 DIAGNOSIS — Z8659 Personal history of other mental and behavioral disorders: Secondary | ICD-10-CM | POA: Diagnosis not present

## 2015-06-15 DIAGNOSIS — E538 Deficiency of other specified B group vitamins: Secondary | ICD-10-CM | POA: Diagnosis not present

## 2015-06-15 LAB — CBC WITH DIFFERENTIAL/PLATELET
BASOS ABS: 0 10*3/uL (ref 0.0–0.1)
Basophils Relative: 0 %
Eosinophils Absolute: 0.1 10*3/uL (ref 0.0–0.7)
Eosinophils Relative: 1 %
HEMATOCRIT: 38.1 % — AB (ref 39.0–52.0)
HEMOGLOBIN: 12.8 g/dL — AB (ref 13.0–17.0)
LYMPHS ABS: 1.2 10*3/uL (ref 0.7–4.0)
Lymphocytes Relative: 16 %
MCH: 30.3 pg (ref 26.0–34.0)
MCHC: 33.6 g/dL (ref 30.0–36.0)
MCV: 90.3 fL (ref 78.0–100.0)
Monocytes Absolute: 0.8 10*3/uL (ref 0.1–1.0)
Monocytes Relative: 11 %
NEUTROS PCT: 72 %
Neutro Abs: 5.2 10*3/uL (ref 1.7–7.7)
Platelets: 214 10*3/uL (ref 150–400)
RBC: 4.22 MIL/uL (ref 4.22–5.81)
RDW: 17.2 % — AB (ref 11.5–15.5)
WBC: 7.3 10*3/uL (ref 4.0–10.5)

## 2015-06-15 LAB — COMPREHENSIVE METABOLIC PANEL
ALT: 12 U/L — ABNORMAL LOW (ref 17–63)
ANION GAP: 11 (ref 5–15)
AST: 24 U/L (ref 15–41)
Albumin: 3.6 g/dL (ref 3.5–5.0)
Alkaline Phosphatase: 70 U/L (ref 38–126)
BUN: 7 mg/dL (ref 6–20)
CHLORIDE: 101 mmol/L (ref 101–111)
CO2: 28 mmol/L (ref 22–32)
Calcium: 9 mg/dL (ref 8.9–10.3)
Creatinine, Ser: 0.83 mg/dL (ref 0.61–1.24)
Glucose, Bld: 177 mg/dL — ABNORMAL HIGH (ref 65–99)
POTASSIUM: 3 mmol/L — AB (ref 3.5–5.1)
Sodium: 140 mmol/L (ref 135–145)
Total Bilirubin: 0.5 mg/dL (ref 0.3–1.2)
Total Protein: 7.3 g/dL (ref 6.5–8.1)

## 2015-06-15 MED ORDER — POTASSIUM CHLORIDE CRYS ER 20 MEQ PO TBCR
40.0000 meq | EXTENDED_RELEASE_TABLET | Freq: Once | ORAL | Status: AC
  Administered 2015-06-15: 40 meq via ORAL
  Filled 2015-06-15: qty 2

## 2015-06-15 MED ORDER — PALONOSETRON HCL INJECTION 0.25 MG/5ML
0.2500 mg | Freq: Once | INTRAVENOUS | Status: AC
  Administered 2015-06-15: 0.25 mg via INTRAVENOUS
  Filled 2015-06-15: qty 5

## 2015-06-15 MED ORDER — HEPARIN SOD (PORK) LOCK FLUSH 100 UNIT/ML IV SOLN: 500.0000 [IU] | Freq: Once | INTRAVENOUS | Status: DC | PRN

## 2015-06-15 MED ORDER — LEUCOVORIN CALCIUM INJECTION 350 MG
400.0000 mg/m2 | Freq: Once | INTRAVENOUS | Status: AC
  Administered 2015-06-15: 912 mg via INTRAVENOUS
  Filled 2015-06-15: qty 45.6

## 2015-06-15 MED ORDER — DEXTROSE 5 % IV SOLN
Freq: Once | INTRAVENOUS | Status: AC
  Administered 2015-06-15: 11:00:00 via INTRAVENOUS

## 2015-06-15 MED ORDER — POTASSIUM CHLORIDE CRYS ER 20 MEQ PO TBCR: 20.0000 meq | EXTENDED_RELEASE_TABLET | Freq: Two times a day (BID) | ORAL | Status: DC

## 2015-06-15 MED ORDER — DEXTROSE 5 % IV SOLN
72.0000 mg/m2 | Freq: Once | INTRAVENOUS | Status: AC
  Administered 2015-06-15: 165 mg via INTRAVENOUS
  Filled 2015-06-15: qty 33

## 2015-06-15 MED ORDER — DEXAMETHASONE SODIUM PHOSPHATE 100 MG/10ML IJ SOLN
10.0000 mg | Freq: Once | INTRAMUSCULAR | Status: AC
  Administered 2015-06-15: 10 mg via INTRAVENOUS
  Filled 2015-06-15: qty 1

## 2015-06-15 MED ORDER — SODIUM CHLORIDE 0.9% FLUSH: 10.0000 mL | INTRAVENOUS | Status: DC | PRN

## 2015-06-15 MED ORDER — SODIUM CHLORIDE 0.9 % IV SOLN
2160.0000 mg/m2 | INTRAVENOUS | Status: DC
  Administered 2015-06-15: 4900 mg via INTRAVENOUS
  Filled 2015-06-15: qty 98

## 2015-06-15 NOTE — Progress Notes (Signed)
Erik [name] Tolerated chemotherapy well discharged ambulatory

## 2015-06-15 NOTE — Patient Instructions (Addendum)
[place] at [facility] Discharge Instructions  RECOMMENDATIONS MADE BY THE CONSULTANT AND ANY TEST RESULTS WILL BE SENT TO YOUR REFERRING PHYSICIAN.  Exam and discussion today with Dr. Whitney Muse. Your oxaliplatin has been dose-reduced to help relieve some of your symptoms associated with this medication. Return as scheduled in 2 weeks for office visit and chemotherapy.   Thank you for choosing [place] at [facility] to provide your oncology and hematology care.  To afford each patient quality time with our provider, please arrive at least 15 minutes before your scheduled appointment time.   Beginning January 23rd 2017 lab work for the [facility] will be done in the  Main lab at [facility] on 1st floor. If you have a lab appointment with the [facility] please come in thru the  Main Entrance and check in at the main information desk  You need to re-schedule your appointment should you arrive 10 or more minutes late.  We strive to give you quality time with our providers, and arriving late affects you and other patients whose appointments are after yours.  Also, if you no show three or more times for appointments you may be dismissed from the clinic at the providers discretion.     Again, thank you for choosing [facility].  Our hope is that these requests will decrease the amount of time that you wait before being seen by our physicians.       _____________________________________________________________  Should you have questions after your visit to [facility], please contact our office at [phone] between the hours of 8:30 a.m. and 4:30 p.m.  Voicemails left after 4:30 p.m. will not be returned until the following business day.  For prescription refill requests, have your pharmacy contact our office.         Resources For Cancer Patients and their Caregivers ? American Cancer Society: Can assist  with transportation, wigs, general needs, runs Look Good Feel Better.        (514)742-8075 ? Cancer Care: Provides financial assistance, online support groups, medication/co-pay assistance.  1-800-813-HOPE (236) 009-5948) ? [place] Assists [place] cancer patients and their families through emotional , educational and financial support.  406-810-8362 ? [place] DSS Where to apply for food stamps, Medicaid and utility assistance. 6146305387 ? RCATS: Transportation to medical appointments. 443-531-7453 ? Social Security Administration: May apply for disability if have a Stage IV cancer. (475) 565-7267 940-427-1235 ? [employer], Disability and Transit Services: Assists with nutrition, care and transit needs. [place] Support Programs: @10RELATIVEDAYS @ > Cancer Support Group  2nd Tuesday of the month 1pm-2pm, Journey Room  > Creative Journey  3rd Tuesday of the month 1130am-1pm, Journey Room  > Look Good Feel Better  1st Wednesday of the month 10am-12 noon, Journey Room (Call [place] to register [phone])

## 2015-06-15 NOTE — Progress Notes (Signed)
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 appendix is normal. Apple-core lesion identified in the mid transverse colon without obstruction.  Vascular/Lymphatic: There is abdominal aortic atherosclerosis without aneurysm. No gastrohepatic or hepatoduodenal ligament lymphadenopathy. No retroperitoneal lymphadenopathy. No definite lymphadenopathy adjacent to the transverse colon lesion. No evidence for lymphadenopathy in the gastrohepatic ligament or omentum.  Reproductive: Prostate gland is mildly enlarged with central dystrophic calcification  Other: No intraperitoneal free fluid.  Musculoskeletal: Bone windows reveal no worrisome lytic or sclerotic osseous lesions.  IMPRESSION: 1. Apple-core lesion  identified in the mid transverse colon without obstruction. No evidence for lymphadenopathy in the gastrohepatic ligament or omentum. 2. Stable 8 mm hypo attenuating lesion in the left liver, likely a cyst.   Electronically Signed  By: Misty Stanley M.D.  On: 01/18/2015 11:23  ASSESSMENT & PLAN:  [id number] adenocarcinoma of the terminal ileum, Stage III Peri-neural and LVI noted, 4/19 nodes involved Colonoscopy on 01/12/2015 with Dr Oneida Alar showing mass at the splenic flexure EGD on 01/12/2015 with moderate erosive gastritis from ASA use CEA 01/12/2015 at 6.6 ng/ml Extended R hemicolectomy with Dr. Adonis Huguenin on 02/10/2015 1.1 cm LUL pulmonary nodule B12 deficiency 149 pg/ml   Adenocarcinoma of transverse colon ([place]) Stage IIIC ([id number]) adenocarcinoma of transverse colon, S/P definitive surgery with a right extended hemicolectomy by Dr. [name].  Now on systemic adjuvant therapy consisting of FOLFOX.  Oncology history is up to date.  I have reduced his oxaliplatin and 5-FU. Therapy was delayed last week. His 'diarrhea' is hard to gauge but overall it appears to be the same since surgery. Weight is overall stable. I offered return next week for reassessment but he notes he prefers 2 week follow-up.   B12 deficiency Continue B12 monthly for now. Will eventually try OTC oral B12 with close observation in future.   H/O Depression  He continues to follow with his therapist. He has met with Abby Potash here as well.   Hypokalemia  He will receive a one time dose of potassium today, and should resume taking potassium at home. I have re-emphasized the importance of compliance with his potassium.   He doesn't think he needs any refills at this time, and will continue to be in touch with [facility].  All questions were answered. The patient knows to call the clinic with any problems, questions or concerns.  This document serves as a record of services personally performed by Ancil Linsey, MD. It was created on her behalf by Toni Amend, a trained medical scribe. The creation of this record is based on the scribe's personal observations and the provider's statements to them. This document has been checked and approved by the attending provider.  I have reviewed the above documentation for accuracy and completeness and I agree with the above.  This note was electronically signed.  Molli Hazard, MD  06/15/2015 1:26 PM

## 2015-06-15 NOTE — Patient Instructions (Signed)
[facility] Discharge Instructions for Patients Receiving Chemotherapy   Beginning January 23rd 2017 lab work for the [facility] will be done in the  Main lab at [facility] on 1st floor. If you have a lab appointment with the [facility] please come in thru the  Main Entrance and check in at the main information desk   Today you received the following chemotherapy agents leucovorin and oxaliplatin and 59fu Potassium today Potassium sent to your pharmacy to take twice a day Return Thursday to have your pump removed Return in two weeks to see the doctor and to have chemotherapy      To help prevent nausea and vomiting after your treatment, we encourage you to take your nausea medication   If you develop nausea and vomiting, or diarrhea that is not controlled by your medication, call the clinic.  The clinic phone number is [phone]. Office hours are Monday-Friday 8:30am-5:00pm.  BELOW ARE SYMPTOMS THAT SHOULD BE REPORTED IMMEDIATELY:  *FEVER GREATER THAN 101.0 F  *CHILLS WITH OR WITHOUT FEVER  NAUSEA AND VOMITING THAT IS NOT CONTROLLED WITH YOUR NAUSEA MEDICATION  *UNUSUAL SHORTNESS OF BREATH  *UNUSUAL BRUISING OR BLEEDING  TENDERNESS IN MOUTH AND THROAT WITH OR WITHOUT PRESENCE OF ULCERS  *URINARY PROBLEMS  *BOWEL PROBLEMS  UNUSUAL RASH Items with * indicate a potential emergency and should be followed up as soon as possible. If you have an emergency after office hours please contact your primary care physician or go to the nearest emergency department.  Please call the clinic during office hours if you have any questions or concerns.   You may also contact the Patient Navigator at [phone] should you have any questions or need assistance in obtaining follow up care.      Resources For Cancer Patients and their Caregivers ? American Cancer Society: Can assist with transportation, wigs, general needs, runs Look Good Feel Better.         818-524-8393 ? Cancer Care: Provides financial assistance, online support groups, medication/co-pay assistance.  1-800-813-HOPE (954)539-2403) ? [place] Assists [place] cancer patients and their families through emotional , educational and financial support.  938-274-5061 ? [place] DSS Where to apply for food stamps, Medicaid and utility assistance. 873-190-2696 ? RCATS: Transportation to medical appointments. 469-551-9118 ? Social Security Administration: May apply for disability if have a Stage IV cancer. [phone] [phone] ? [employer], Disability and Transit Services: Assists with nutrition, care and transit needs. [phone]

## 2015-06-17 ENCOUNTER — Encounter (HOSPITAL_COMMUNITY): Payer: Self-pay

## 2015-06-17 ENCOUNTER — Encounter: Payer: Self-pay | Admitting: Physician Assistant

## 2015-06-17 ENCOUNTER — Ambulatory Visit: Payer: Self-pay | Admitting: Physician Assistant

## 2015-06-17 ENCOUNTER — Encounter (HOSPITAL_BASED_OUTPATIENT_CLINIC_OR_DEPARTMENT_OTHER): Payer: Medicaid Other

## 2015-06-17 VITALS — BP 122/66 | HR 90 | Temp 98.0°F | Resp 18

## 2015-06-17 VITALS — BP 120/76 | HR 99 | Temp 97.2°F | Ht 74.0 in | Wt 226.3 lb

## 2015-06-17 DIAGNOSIS — F17219 Nicotine dependence, cigarettes, with unspecified nicotine-induced disorders: Secondary | ICD-10-CM

## 2015-06-17 DIAGNOSIS — C184 Malignant neoplasm of transverse colon: Secondary | ICD-10-CM

## 2015-06-17 DIAGNOSIS — R55 Syncope and collapse: Secondary | ICD-10-CM

## 2015-06-17 DIAGNOSIS — K219 Gastro-esophageal reflux disease without esophagitis: Secondary | ICD-10-CM

## 2015-06-17 DIAGNOSIS — Z452 Encounter for adjustment and management of vascular access device: Secondary | ICD-10-CM | POA: Diagnosis not present

## 2015-06-17 DIAGNOSIS — F329 Major depressive disorder, single episode, unspecified: Secondary | ICD-10-CM

## 2015-06-17 DIAGNOSIS — E785 Hyperlipidemia, unspecified: Secondary | ICD-10-CM

## 2015-06-17 DIAGNOSIS — F32A Depression, unspecified: Secondary | ICD-10-CM

## 2015-06-17 DIAGNOSIS — J449 Chronic obstructive pulmonary disease, unspecified: Secondary | ICD-10-CM

## 2015-06-17 DIAGNOSIS — E876 Hypokalemia: Secondary | ICD-10-CM

## 2015-06-17 MED ORDER — DEXLANSOPRAZOLE 60 MG PO CPDR: 60.0000 mg | DELAYED_RELEASE_CAPSULE | Freq: Every day | ORAL | Status: DC

## 2015-06-17 MED ORDER — SODIUM CHLORIDE 0.9% FLUSH
10.0000 mL | INTRAVENOUS | Status: DC | PRN
  Administered 2015-06-17: 10 mL
  Filled 2015-06-17: qty 10

## 2015-06-17 MED ORDER — HEPARIN SOD (PORK) LOCK FLUSH 100 UNIT/ML IV SOLN
500.0000 [IU] | Freq: Once | INTRAVENOUS | Status: AC | PRN
  Administered 2015-06-17: 500 [IU]

## 2015-06-17 NOTE — Progress Notes (Signed)
BP 120/76 mmHg  Pulse 99  Temp(Src) 97.2 F (36.2 C)  Ht 6' 2" (1.88 m)  Wt 226 lb 4.8 oz (102.649 kg)  BMI 29.04 kg/m2  SpO2 96%   Subjective:    Patient ID: Erik [name], male    DOB: [birth year], 60 y.o.   MRN: [id number]  HPI: Erik [name] is a 60 y.o. male presenting on 06/17/2015 for Hyperlipidemia   HPI   -Pt has medicaid now -Pt getting chemo for colon cancer -[facility] [place] [facility] -pt is smoking one cigarette every couple of days -he states occassional etoh -  Once / week he will have "a beer or two" -pt states he is doing okay.  Denies cp, sob     Relevant past medical, surgical, family and social history reviewed and updated as indicated. Interim medical history since our last visit reviewed. Allergies and medications reviewed and updated.   Current outpatient prescriptions:  .  albuterol (PROVENTIL HFA;VENTOLIN HFA) 108 (90 BASE) MCG/ACT inhaler, Inhale 2 puffs into the lungs every 6 (six) hours as needed for wheezing or shortness of breath., Disp: 1 Inhaler, Rfl: 1 .  amitriptyline (ELAVIL) 25 MG tablet, Take 1 tablet (25 mg total) by mouth at bedtime., Disp: 30 tablet, Rfl: 2 .  Cyanocobalamin (B-12 COMPLIANCE INJECTION) 1000 MCG/ML KIT, Inject as directed., Disp: , Rfl:  .  dexlansoprazole (DEXILANT) 60 MG capsule, Take 1 capsule (60 mg total) by mouth daily., Disp: 90 capsule, Rfl: 1 .  fludrocortisone (FLORINEF) 0.1 MG tablet, Take 1 tablet (0.1 mg total) by mouth daily., Disp: 30 tablet, Rfl: 3 .  FLUoxetine (PROZAC) 20 MG capsule, Take 20 mg by mouth 2 (two) times daily. , Disp: , Rfl:  .  HYDROcodone-acetaminophen (NORCO) 5-325 MG tablet, Take 1 tablet by mouth every 6 (six) hours as needed for moderate pain., Disp: 60 tablet, Rfl: 0 .  lidocaine-prilocaine (EMLA) cream, Apply a quarter size amount to port site 1 hour prior to chemo. Do not rub in. Cover with plastic wrap., Disp: 30 g, Rfl: 3 .  potassium chloride SA  (K-DUR,KLOR-CON) 20 MEQ tablet, Take 1 tablet (20 mEq total) by mouth 2 (two) times daily., Disp: 60 tablet, Rfl: 3 .  rosuvastatin (CRESTOR) 40 MG tablet, Take 1 tablet (40 mg total) by mouth daily., Disp: 90 tablet, Rfl: 1 .  atorvastatin (LIPITOR) 20 MG tablet, Take 1 tablet (20 mg total) by mouth daily. (Patient not taking: Reported on 06/15/2015), Disp: 90 tablet, Rfl: 1 .  fluorouracil CALGB 62703 in sodium chloride 0.9 % 150 mL, Inject into the vein. Reported on 03/22/2015, Disp: , Rfl:  .  leucovorin in dextrose 5 % 250 mL, Inject into the vein every 14 (fourteen) days. Reported on 03/22/2015, Disp: , Rfl:  .  OXALIPLATIN IV, Inject into the vein every 14 (fourteen) days. Reported on 03/22/2015, Disp: , Rfl:  .  sildenafil (VIAGRA) 100 MG tablet, Take 0.5-1 tablets (50-100 mg total) by mouth daily as needed for erectile dysfunction. (Patient not taking: Reported on 06/17/2015), Disp: 10 tablet, Rfl: 1   Review of Systems  Per HPI unless specifically indicated above     Objective:    BP 120/76 mmHg  Pulse 99  Temp(Src) 97.2 F (36.2 C)  Ht 6' 2" (1.88 m)  Wt 226 lb 4.8 oz (102.649 kg)  BMI 29.04 kg/m2  SpO2 96%  Wt Readings from Last 3 Encounters:  06/17/15 226 lb 4.8 oz (102.649  kg)  06/15/15 222 lb (100.699 kg)  06/08/15 221 lb (100.245 kg)    Physical Exam  Constitutional: He is oriented to person, place, and time. He appears well-developed and well-nourished.  HENT:  Head: Normocephalic and atraumatic.  Neck: Neck supple.  Cardiovascular: Normal rate and regular rhythm.   Pulmonary/Chest: Effort normal and breath sounds normal. He has no wheezes.  Abdominal: Soft. Bowel sounds are normal. There is no hepatosplenomegaly. There is no tenderness.  Musculoskeletal: He exhibits no edema.  Lymphadenopathy:    He has no cervical adenopathy.  Neurological: He is alert and oriented to person, place, and time.  Skin: Skin is warm and dry.  Psychiatric: He has a normal mood and  affect. His behavior is normal.  Vitals reviewed.   Results for orders placed or performed in visit on 06/15/15  Comprehensive metabolic panel  Result Value Ref Range   Sodium 140 135 - 145 mmol/L   Potassium 3.0 (L) 3.5 - 5.1 mmol/L   Chloride 101 101 - 111 mmol/L   CO2 28 22 - 32 mmol/L   Glucose, Bld 177 (H) 65 - 99 mg/dL   BUN 7 6 - 20 mg/dL   Creatinine, Ser 0.83 0.61 - 1.24 mg/dL   Calcium 9.0 8.9 - 10.3 mg/dL   Total Protein 7.3 6.5 - 8.1 g/dL   Albumin 3.6 3.5 - 5.0 g/dL   AST 24 15 - 41 U/L   ALT 12 (L) 17 - 63 U/L   Alkaline Phosphatase 70 38 - 126 U/L   Total Bilirubin 0.5 0.3 - 1.2 mg/dL   GFR calc non Af Amer >60 >60 mL/min   GFR calc Af Amer >60 >60 mL/min   Anion gap 11 5 - 15  CBC with Differential  Result Value Ref Range   WBC 7.3 4.0 - 10.5 K/uL   RBC 4.22 4.22 - 5.81 MIL/uL   Hemoglobin 12.8 (L) 13.0 - 17.0 g/dL   HCT 38.1 (L) 39.0 - 52.0 %   MCV 90.3 78.0 - 100.0 fL   MCH 30.3 26.0 - 34.0 pg   MCHC 33.6 30.0 - 36.0 g/dL   RDW 17.2 (H) 11.5 - 15.5 %   Platelets 214 150 - 400 K/uL   Neutrophils Relative % 72 %   Neutro Abs 5.2 1.7 - 7.7 K/uL   Lymphocytes Relative 16 %   Lymphs Abs 1.2 0.7 - 4.0 K/uL   Monocytes Relative 11 %   Monocytes Absolute 0.8 0.1 - 1.0 K/uL   Eosinophils Relative 1 %   Eosinophils Absolute 0.1 0.0 - 0.7 K/uL   Basophils Relative 0 %   Basophils Absolute 0.0 0.0 - 0.1 K/uL      Assessment & Plan:   Encounter Diagnoses  Name Primary?  . Hyperlipidemia Yes  . Cigarette nicotine dependence with nicotine-induced disorder   . Depression   . Gastroesophageal reflux disease, esophagitis presence not specified   . Adenocarcinoma of transverse colon ([place])   . Syncope and collapse   . Hypokalemia   . Chronic obstructive pulmonary disease, unspecified COPD type ([place])     -Hypokalemia - potassium called in by dr Whitney Muse -Hyperlipidemia- Check lipids (he can have this drawn next week when he gets it drawn for oncologist). Pt  has 2 more crestor tabs and then he will start his lipitor -GERD- reorder dexilant -colon cancer- pt to continue with oncology -orthostatic syncope- recently saw dr Harl Bowie and started on florinef  -Discussed with pt he that he will  need to get his meds transferred from [facility] to local pharmacy since he has medicaid now -f/u 3 months.  RTO sooner prn

## 2015-06-17 NOTE — Patient Instructions (Signed)
[place] at [facility] Discharge Instructions  RECOMMENDATIONS MADE BY THE CONSULTANT AND ANY TEST RESULTS WILL BE SENT TO YOUR REFERRING PHYSICIAN.  Pump removal and port flush/deaccess today. Return as scheduled for chemo and office visit.  Call the clinic should you have any questions or concerns.   Thank you for choosing [place] at [facility] to provide your oncology and hematology care.  To afford each patient quality time with our provider, please arrive at least 15 minutes before your scheduled appointment time.   Beginning January 23rd 2017 lab work for the [facility] will be done in the  Main lab at [facility] on 1st floor. If you have a lab appointment with the [facility] please come in thru the  Main Entrance and check in at the main information desk  You need to re-schedule your appointment should you arrive 10 or more minutes late.  We strive to give you quality time with our providers, and arriving late affects you and other patients whose appointments are after yours.  Also, if you no show three or more times for appointments you may be dismissed from the clinic at the providers discretion.     Again, thank you for choosing [facility].  Our hope is that these requests will decrease the amount of time that you wait before being seen by our physicians.       _____________________________________________________________  Should you have questions after your visit to [facility], please contact our office at [phone] between the hours of 8:30 a.m. and 4:30 p.m.  Voicemails left after 4:30 p.m. will not be returned until the following business day.  For prescription refill requests, have your pharmacy contact our office.         Resources For Cancer Patients and their Caregivers ? American Cancer Society: Can assist with transportation, wigs, general needs, runs Look Good Feel Better.         620-716-1637 ? Cancer Care: Provides financial assistance, online support groups, medication/co-pay assistance.  1-800-813-HOPE (229) 857-8958) ? [place] Assists [place] cancer patients and their families through emotional , educational and financial support.  770-291-4304 ? [place] DSS Where to apply for food stamps, Medicaid and utility assistance. [phone] ? RCATS: Transportation to medical appointments. (802) 424-1953 ? Social Security Administration: May apply for disability if have a Stage IV cancer. [phone] 225-185-2769 ? [employer], Disability and Transit Services: Assists with nutrition, care and transit needs. [place] Support Programs: @10RELATIVEDAYS @ > Cancer Support Group  2nd Tuesday of the month 1pm-2pm, Journey Room  > Creative Journey  3rd Tuesday of the month 1130am-1pm, Journey Room  > Look Good Feel Better  1st Wednesday of the month 10am-12 noon, Journey Room (Call [place] to register [phone])

## 2015-06-18 DIAGNOSIS — J449 Chronic obstructive pulmonary disease, unspecified: Secondary | ICD-10-CM | POA: Insufficient documentation

## 2015-06-18 DIAGNOSIS — F17219 Nicotine dependence, cigarettes, with unspecified nicotine-induced disorders: Secondary | ICD-10-CM | POA: Insufficient documentation

## 2015-06-18 DIAGNOSIS — F329 Major depressive disorder, single episode, unspecified: Secondary | ICD-10-CM | POA: Insufficient documentation

## 2015-06-18 DIAGNOSIS — F32A Depression, unspecified: Secondary | ICD-10-CM | POA: Insufficient documentation

## 2015-06-22 ENCOUNTER — Encounter (HOSPITAL_BASED_OUTPATIENT_CLINIC_OR_DEPARTMENT_OTHER): Payer: Medicaid Other

## 2015-06-22 ENCOUNTER — Encounter (HOSPITAL_COMMUNITY): Payer: Self-pay

## 2015-06-22 ENCOUNTER — Inpatient Hospital Stay (HOSPITAL_COMMUNITY): Payer: Medicaid Other

## 2015-06-22 VITALS — BP 150/72 | HR 95 | Temp 98.2°F | Resp 20

## 2015-06-22 DIAGNOSIS — E538 Deficiency of other specified B group vitamins: Secondary | ICD-10-CM | POA: Diagnosis present

## 2015-06-22 MED ORDER — CYANOCOBALAMIN 1000 MCG/ML IJ SOLN
1000.0000 ug | Freq: Once | INTRAMUSCULAR | Status: AC
  Administered 2015-06-22: 1000 ug via INTRAMUSCULAR

## 2015-06-22 MED ORDER — CYANOCOBALAMIN 1000 MCG/ML IJ SOLN
INTRAMUSCULAR | Status: AC
  Filled 2015-06-22: qty 1

## 2015-06-22 NOTE — Progress Notes (Signed)
Erik [name] presents today for injection per the provider's orders.  B12 administration without incident; see MAR for injection details.  Patient tolerated procedure well and without incident.  No questions or complaints noted at this time.

## 2015-06-22 NOTE — Patient Instructions (Signed)
[place] at [facility] Discharge Instructions  RECOMMENDATIONS MADE BY THE CONSULTANT AND ANY TEST RESULTS WILL BE SENT TO YOUR REFERRING PHYSICIAN.  B12 injection today. Return as scheduled for chemotherapy and office visit. Return as scheduled for injections.  Call the clinic with any questions or concerns.   Thank you for choosing [place] at [facility] to provide your oncology and hematology care.  To afford each patient quality time with our provider, please arrive at least 15 minutes before your scheduled appointment time.   Beginning January 23rd 2017 lab work for the [facility] will be done in the  Main lab at [facility] on 1st floor. If you have a lab appointment with the [facility] please come in thru the  Main Entrance and check in at the main information desk  You need to re-schedule your appointment should you arrive 10 or more minutes late.  We strive to give you quality time with our providers, and arriving late affects you and other patients whose appointments are after yours.  Also, if you no show three or more times for appointments you may be dismissed from the clinic at the providers discretion.     Again, thank you for choosing [facility].  Our hope is that these requests will decrease the amount of time that you wait before being seen by our physicians.       _____________________________________________________________  Should you have questions after your visit to [facility], please contact our office at [phone] between the hours of 8:30 a.m. and 4:30 p.m.  Voicemails left after 4:30 p.m. will not be returned until the following business day.  For prescription refill requests, have your pharmacy contact our office.         Resources For Cancer Patients and their Caregivers ? American Cancer Society: Can assist with transportation, wigs, general needs, runs Look Good Feel  Better.        [phone] ? Cancer Care: Provides financial assistance, online support groups, medication/co-pay assistance.  1-800-813-HOPE 819-063-4246) ? [place] Assists [place] cancer patients and their families through emotional , educational and financial support.  678-140-2200 ? [place] DSS Where to apply for food stamps, Medicaid and utility assistance. (902) 503-3774 ? RCATS: Transportation to medical appointments. 347-725-0714 ? Social Security Administration: May apply for disability if have a Stage IV cancer. 940-492-5655 (502) [phone] ? [employer], Disability and Transit Services: Assists with nutrition, care and transit needs. [place] Support Programs: @10RELATIVEDAYS @ > Cancer Support Group  2nd Tuesday of the month 1pm-2pm, Journey Room  > Creative Journey  3rd Tuesday of the month 1130am-1pm, Journey Room  > Look Good Feel Better  1st Wednesday of the month 10am-12 noon, Journey Room (Call [place] to register [phone])

## 2015-06-24 ENCOUNTER — Encounter (HOSPITAL_COMMUNITY): Payer: Medicaid Other

## 2015-06-29 ENCOUNTER — Encounter (HOSPITAL_BASED_OUTPATIENT_CLINIC_OR_DEPARTMENT_OTHER): Payer: Medicaid Other | Admitting: Oncology

## 2015-06-29 ENCOUNTER — Encounter (HOSPITAL_COMMUNITY): Payer: Self-pay

## 2015-06-29 ENCOUNTER — Encounter: Payer: Self-pay | Admitting: *Deleted

## 2015-06-29 ENCOUNTER — Encounter (HOSPITAL_BASED_OUTPATIENT_CLINIC_OR_DEPARTMENT_OTHER): Payer: Medicaid Other

## 2015-06-29 VITALS — BP 154/96 | HR 96 | Temp 98.1°F | Resp 18 | Wt 219.4 lb

## 2015-06-29 DIAGNOSIS — C184 Malignant neoplasm of transverse colon: Secondary | ICD-10-CM

## 2015-06-29 DIAGNOSIS — E538 Deficiency of other specified B group vitamins: Secondary | ICD-10-CM | POA: Diagnosis not present

## 2015-06-29 DIAGNOSIS — Z5111 Encounter for antineoplastic chemotherapy: Secondary | ICD-10-CM | POA: Diagnosis not present

## 2015-06-29 LAB — COMPREHENSIVE METABOLIC PANEL
ALT: 11 U/L — ABNORMAL LOW (ref 17–63)
AST: 18 U/L (ref 15–41)
Albumin: 3.8 g/dL (ref 3.5–5.0)
Alkaline Phosphatase: 68 U/L (ref 38–126)
Anion gap: 6 (ref 5–15)
BUN: 7 mg/dL (ref 6–20)
CHLORIDE: 102 mmol/L (ref 101–111)
CO2: 30 mmol/L (ref 22–32)
Calcium: 9.3 mg/dL (ref 8.9–10.3)
Creatinine, Ser: 0.66 mg/dL (ref 0.61–1.24)
Glucose, Bld: 106 mg/dL — ABNORMAL HIGH (ref 65–99)
POTASSIUM: 4 mmol/L (ref 3.5–5.1)
Sodium: 138 mmol/L (ref 135–145)
Total Bilirubin: 0.9 mg/dL (ref 0.3–1.2)
Total Protein: 7.3 g/dL (ref 6.5–8.1)

## 2015-06-29 LAB — CBC WITH DIFFERENTIAL/PLATELET
BASOS ABS: 0 10*3/uL (ref 0.0–0.1)
BASOS PCT: 0 %
EOS ABS: 0.1 10*3/uL (ref 0.0–0.7)
Eosinophils Relative: 2 %
HCT: 37.3 % — ABNORMAL LOW (ref 39.0–52.0)
Hemoglobin: 12.2 g/dL — ABNORMAL LOW (ref 13.0–17.0)
Lymphocytes Relative: 21 %
Lymphs Abs: 1.6 10*3/uL (ref 0.7–4.0)
MCH: 30.7 pg (ref 26.0–34.0)
MCHC: 32.7 g/dL (ref 30.0–36.0)
MCV: 93.7 fL (ref 78.0–100.0)
Monocytes Absolute: 0.7 10*3/uL (ref 0.1–1.0)
Monocytes Relative: 9 %
NEUTROS PCT: 68 %
Neutro Abs: 5.1 10*3/uL (ref 1.7–7.7)
PLATELETS: 178 10*3/uL (ref 150–400)
RBC: 3.98 MIL/uL — ABNORMAL LOW (ref 4.22–5.81)
RDW: 17.9 % — AB (ref 11.5–15.5)
WBC: 7.5 10*3/uL (ref 4.0–10.5)

## 2015-06-29 MED ORDER — DEXTROSE 5 % IV SOLN
400.0000 mg/m2 | Freq: Once | INTRAVENOUS | Status: AC
  Administered 2015-06-29: 912 mg via INTRAVENOUS
  Filled 2015-06-29: qty 45.6

## 2015-06-29 MED ORDER — DEXTROSE 5 % IV SOLN
Freq: Once | INTRAVENOUS | Status: AC
  Administered 2015-06-29: 13:00:00 via INTRAVENOUS

## 2015-06-29 MED ORDER — HEPARIN SOD (PORK) LOCK FLUSH 100 UNIT/ML IV SOLN
500.0000 [IU] | Freq: Once | INTRAVENOUS | Status: DC | PRN
  Filled 2015-06-29: qty 5

## 2015-06-29 MED ORDER — PALONOSETRON HCL INJECTION 0.25 MG/5ML
INTRAVENOUS | Status: AC
  Filled 2015-06-29: qty 5

## 2015-06-29 MED ORDER — SODIUM CHLORIDE 0.9% FLUSH
10.0000 mL | INTRAVENOUS | Status: DC | PRN
Start: 2015-06-29 — End: 2015-06-29

## 2015-06-29 MED ORDER — SODIUM CHLORIDE 0.9 % IV SOLN
2160.0000 mg/m2 | INTRAVENOUS | Status: DC
  Administered 2015-06-29: 4900 mg via INTRAVENOUS
  Filled 2015-06-29: qty 98

## 2015-06-29 MED ORDER — OXALIPLATIN CHEMO INJECTION 100 MG/20ML
72.0000 mg/m2 | Freq: Once | INTRAVENOUS | Status: AC
  Administered 2015-06-29: 165 mg via INTRAVENOUS
  Filled 2015-06-29: qty 33

## 2015-06-29 MED ORDER — SODIUM CHLORIDE 0.9 % IV SOLN
10.0000 mg | Freq: Once | INTRAVENOUS | Status: AC
  Administered 2015-06-29: 10 mg via INTRAVENOUS
  Filled 2015-06-29: qty 1

## 2015-06-29 MED ORDER — PALONOSETRON HCL INJECTION 0.25 MG/5ML
0.2500 mg | Freq: Once | INTRAVENOUS | Status: AC
  Administered 2015-06-29: 0.25 mg via INTRAVENOUS

## 2015-06-29 NOTE — Progress Notes (Signed)
[name], PA-C [place] [address]  Adenocarcinoma of transverse colon [facility])  B12 deficiency  CURRENT THERAPY: FOLFOX beginning on 03/30/2015  INTERVAL HISTORY: Erik [name] 60 y.o. male returns for followup of Stage IIIC [facility]) adenocarcinoma of transverse colon.    Adenocarcinoma of transverse colon ([place])   01/12/2015 Pathologic Stage Colon, biopsy, distal transverse - TUBULOVILLOUS ADENOMA WITH HIGH GRADE DYSPLASIA.   01/12/2015 Procedure Colonoscopy by Dr. Oneida Alar.   01/12/2015 Tumor Marker CEA: 6.6 (H)    01/18/2015 Imaging CT abd/pelvis- Apple-core lesion identified in the mid transverse colon without obstruction. No evidence for lymphadenopathy in the gastrohepatic ligament or omentum.  Stable 8 mm hypo attenuating lesion in the left liver, likely a cyst.   02/10/2015 Initial Diagnosis Adenocarcinoma of transverse colon ([place])   02/12/2015 Definitive Surgery [name], Extended right hemicolectomy    02/12/2015 Pathology Results Mucinous adenocarcinoma with penetration of visceral peritoneum, 4/19 lymph nodes for metasattic disease, negative resection margins, with LVI and perineural invasion   03/30/2015 -  Chemotherapy FOLFOX   05/25/2015 Treatment Plan Change 5 FU bolus discontinued for cycle #5   06/08/2015 Treatment Plan Change Treatment deferred x 1 week   06/15/2015 Treatment Plan Change 5FU CI decreased by 10% and Oxaliplatin reduced by 15%     I personally reviewed and went over laboratory results with the patient.  The results are noted within this dictation.  Labs satisfy treatment parameters today.  His PN is at baseline and is resolved prior to today's treatment.  He denies any nausea/vomiting.  He is appreciative of his 1 week break from therapy in the beginning of May 2017.  He continues with 3- loose stolls per day.  He states that it might be slightly better.  He is re-educated regarding Imodium dosing.  "I thought I could  only take 4 Imodium per day."  He is involved with his therapist.  He notes that it is helpful, but he is not reporting a significant changes.  Review of Systems  Constitutional: Negative.   HENT: Negative.   Eyes: Negative.   Respiratory: Negative.   Cardiovascular: Negative.   Gastrointestinal: Negative.   Genitourinary: Negative.   Musculoskeletal: Negative.   Skin: Negative.   Neurological: Negative.   Endo/Heme/Allergies: Negative.   Psychiatric/Behavioral: Negative.     Past Medical History  Diagnosis Date  . Head trauma 2001    closed head injury; coma for 4 weeks  . Depression   . Abnormal stress echocardiogram   . Hypercholesterolemia   . Hypertension   . Alcohol abuse     Heavy Use up until 2010  . GERD (gastroesophageal reflux disease)   . PTSD (post-traumatic stress disorder)   . Anxiety   . Adenocarcinoma of transverse colon ([place]) 02/10/2015    Past Surgical History  Procedure Laterality Date  . Kidney surgery      >30 years ago  . Cardiac cath    . Craniotomy  2001  . Head injury surgery    . Colonoscopy  2011    Dr. Oneida Alar: multiple adenomas and hyperplastic polyps  . Colonoscopy with propofol N/A 01/12/2015    Procedure: COLONOSCOPY WITH PROPOFOL;  Surgeon: Danie Binder, MD;  [facility];  Service: Endoscopy;  Laterality: N/A;  1030  . Esophagogastroduodenoscopy (egd) with propofol N/A 01/12/2015    Procedure: ESOPHAGOGASTRODUODENOSCOPY (EGD) WITH PROPOFOL;  Surgeon: Danie Binder, MD;  Location: [facility];  Service:  Endoscopy;  Laterality: N/A;  . Hernia repair Right 2012    Inguinal- Forestine Na  . Laparoscopic right hemi colectomy Left 02/10/2015    Procedure: LAPAROSCOPIC THEN OPEN RIGHT HEMI COLECTOMY;  Surgeon: [name], MD;  Location: [facility] ORS;  Service: General;  Laterality: Left;  . Portacath placement N/A 03/24/2015    Procedure: INSERTION PORT-A-CATH;  Surgeon: Jules Husbands, MD;  Location: [facility] ORS;  Service: General;   Laterality: N/A;    Family History  Problem Relation Age of Onset  . Ataxia Neg Hx   . Chorea Neg Hx   . Dementia Neg Hx   . Mental retardation Neg Hx   . Migraines Neg Hx   . Multiple sclerosis Neg Hx   . Neurofibromatosis Neg Hx   . Neuropathy Neg Hx   . Parkinsonism Neg Hx   . Seizures Neg Hx   . Stroke Neg Hx   . Colon cancer Neg Hx   . Pulmonary embolism Mother   . Breast cancer Mother   . Heart disease Father   . Cancer Father 29    Leukemia  . Cancer Mother   . Cancer Paternal Grandfather     Lung    Social History   Social History  . Marital Status: Divorced    Spouse Name: N/A  . Number of Children: N/A  . Years of Education: N/A   Occupational History  . Full time    Social History Main Topics  . Smoking status: Current Some Day Smoker -- 0.10 packs/day    Types: Cigarettes  . Smokeless tobacco: Never Used  . Alcohol Use: 0.0 oz/week    0 Standard drinks or equivalent per week     Comment: beer occ, history of ETOH abuse in remote past.   . Drug Use: No     Comment: marijuana last 2012  . Sexual Activity:    Partners: Female   Other Topics Concern  . Not on file   Social History Narrative   Single     PHYSICAL EXAMINATION  ECOG PERFORMANCE STATUS: 1 - Symptomatic but completely ambulatory  There were no vitals filed for this visit.  Blood pressure 131/86 Pulse 101 Respirations 20 Temperature 98.41F Oximetry is 98% on room air.  GENERAL:alert, no distress, well nourished, well developed, comfortable, cooperative, smiling and unaccompanied SKIN: skin color, texture, turgor are normal, no rashes or significant lesions HEAD: Normocephalic, No masses, lesions, tenderness or abnormalities EYES: normal, EOMI, Conjunctiva are pink and non-injected EARS: External ears normal OROPHARYNX:lips, buccal mucosa, and tongue normal and mucous membranes are moist  NECK: supple, trachea midline LYMPH:  not examined BREAST:not examined LUNGS: clear  to auscultation and percussion HEART: regular rate & rhythm, no murmurs, no gallops, S1 normal and S2 normal ABDOMEN:abdomen soft and normal bowel sounds BACK: Back symmetric, no curvature. EXTREMITIES:less then 2 second capillary refill, no joint deformities, effusion, or inflammation, no skin discoloration, no cyanosis  NEURO: alert & oriented x 3 with fluent speech, no focal motor/sensory deficits, gait normal   LABORATORY DATA: CBC    Component Value Date/Time   WBC 7.5 06/29/2015 1150   RBC 3.98* 06/29/2015 1150   HGB 12.2* 06/29/2015 1150   HCT 37.3* 06/29/2015 1150   PLT 178 06/29/2015 1150   MCV 93.7 06/29/2015 1150   MCH 30.7 06/29/2015 1150   MCHC 32.7 06/29/2015 1150   RDW 17.9* 06/29/2015 1150   LYMPHSABS 1.6 06/29/2015 1150   MONOABS 0.7 06/29/2015 1150   EOSABS 0.1  06/29/2015 1150   BASOSABS 0.0 06/29/2015 1150      Chemistry      Component Value Date/Time   NA 138 06/29/2015 1150   K 4.0 06/29/2015 1150   CL 102 06/29/2015 1150   CO2 30 06/29/2015 1150   BUN 7 06/29/2015 1150   CREATININE 0.66 06/29/2015 1150   CREATININE 0.86 12/16/2014 0844      Component Value Date/Time   CALCIUM 9.3 06/29/2015 1150   ALKPHOS 68 06/29/2015 1150   AST 18 06/29/2015 1150   ALT 11* 06/29/2015 1150   BILITOT 0.9 06/29/2015 1150        PENDING LABS:   RADIOGRAPHIC STUDIES:  No results found.   PATHOLOGY:    ASSESSMENT AND PLAN:  Adenocarcinoma of transverse colon ([place]) Stage IIIC [id number]) adenocarcinoma of transverse colon, S/P definitive surgery with a right extended hemicolectomy by Dr. [name].  Now on systemic adjuvant therapy consisting of FOLFOX.  Oncology history is updated.  Dose reductions in chemotherapy are noted and added to oncology history.  Pre-treatment labs as ordered.  Return in 2 week with pre-treatment labs: CBC diff, CMET  Return in 2 weeks for follow-up and next cycle of FOLFOX.   B12 deficiency Continue B12 IM  monthly for now. Will eventually try OTC oral B12 with close observation in future.   Oncology Flowsheet 06/22/2015  cyanocobalamin ((VITAMIN B-12)) IM 1,000 mcg   Due in ~ 3 weeks for next injection.  Supportive therapy plan is reviewed.    ORDERS PLACED FOR THIS ENCOUNTER: No orders of the defined types were placed in this encounter.    MEDICATIONS PRESCRIBED THIS ENCOUNTER: No orders of the defined types were placed in this encounter.    THERAPY PLAN:  Continue with treatment as planned.  All questions were answered. The patient knows to call the clinic with any problems, questions or concerns. We can certainly see the patient much sooner if necessary.  Patient and plan discussed with Dr. Ancil Linsey and she is in agreement with the aforementioned.   This note is electronically signed by: Doy Mince 06/29/2015 4:56 PM

## 2015-06-29 NOTE — Progress Notes (Signed)
[place] Clinical Social Work  Clinical Social Work met with pt while rounding in the infusion area for assessment of psychosocial needs due to past issues with insurance. Pt reports he has MCD now and that this is really helping with medication costs and transportation assistance. Clinical Social Worker reviewed additional resource options and encouraged him to attend Support Group, painting class and other support programs. Pt shared he is having more side effects from his treatment and CSW provided supportive listening.      Clinical Social Work interventions: Resource assistance Supportive listening  [place], [place] Tuesdays   Phone:([phone]

## 2015-06-29 NOTE — Progress Notes (Signed)
Patient tolerated infusion well.  VSS.   

## 2015-06-29 NOTE — Assessment & Plan Note (Signed)
Stage IIIC [facility]) adenocarcinoma of transverse colon, S/P definitive surgery with a right extended hemicolectomy by Dr. [name].  Now on systemic adjuvant therapy consisting of FOLFOX.  Oncology history is updated.  Dose reductions in chemotherapy are noted and added to oncology history.  Pre-treatment labs as ordered.  Return in 2 week with pre-treatment labs: CBC diff, CMET  Return in 2 weeks for follow-up and next cycle of FOLFOX.

## 2015-06-29 NOTE — Patient Instructions (Signed)
[facility] at [facility] Discharge Instructions  RECOMMENDATIONS MADE BY THE CONSULTANT AND ANY TEST RESULTS WILL BE SENT TO YOUR REFERRING PHYSICIAN.    Thank you for choosing [facility] at [facility] to provide your oncology and hematology care.  To afford each patient quality time with our provider, please arrive at least 15 minutes before your scheduled appointment time.   Beginning January 23rd 2017 lab work for the Cancer Center will be done in the  Main lab at [place] on 1st floor. If you have a lab appointment with the Cancer Center please come in thru the  Main Entrance and check in at the main information desk  You need to re-schedule your appointment should you arrive 10 or more minutes late.  We strive to give you quality time with our providers, and arriving late affects you and other patients whose appointments are after yours.  Also, if you no show three or more times for appointments you may be dismissed from the clinic at the providers discretion.     Again, thank you for choosing [facility].  Our hope is that these requests will decrease the amount of time that you wait before being seen by our physicians.       _____________________________________________________________  Should you have questions after your visit to [facility], please contact our office at [phone] between the hours of 8:30 a.m. and 4:30 p.m.  Voicemails left after 4:30 p.m. will not be returned until the following business day.  For prescription refill requests, have your pharmacy contact our office.         Resources For Cancer Patients and their Caregivers ? American Cancer Society: Can assist with transportation, wigs, general needs, runs Look Good Feel Better.        1-888-227-6333 ? Cancer Care: Provides financial assistance, online support groups, medication/co-pay assistance.  1-800-813-HOPE (4673) ? [facility] Assists [place] cancer patients and their families through emotional , educational and financial support.  336-427-4357 ? [place] DSS Where to apply for food stamps, Medicaid and utility assistance. 336-342-1394 ? RCATS: Transportation to medical appointments. 336-347-2287 ? Social Security Administration: May apply for disability if have a Stage IV cancer. 336-342-7796 1-800-772-1213 ? [place] Aging, Disability and Transit Services: Assists with nutrition, care and transit needs. 336-349-2343  Cancer Center Support Programs: @10RELATIVEDAYS@ > Cancer Support Group  2nd Tuesday of the month 1pm-2pm, Journey Room  > Creative Journey  3rd Tuesday of the month 1130am-1pm, Journey Room  > Look Good Feel Better  1st Wednesday of the month 10am-12 noon, Journey Room (Call American Cancer Society to register 1-800-395-5775)   

## 2015-06-29 NOTE — Assessment & Plan Note (Signed)
Continue B12 IM monthly for now. Will eventually try OTC oral B12 with close observation in future.   Oncology Flowsheet 06/22/2015  cyanocobalamin ((VITAMIN B-12)) IM 1,000 mcg   Due in ~ 3 weeks for next injection.  Supportive therapy plan is reviewed.

## 2015-07-01 ENCOUNTER — Encounter (HOSPITAL_BASED_OUTPATIENT_CLINIC_OR_DEPARTMENT_OTHER): Payer: Medicaid Other

## 2015-07-01 VITALS — BP 129/80 | HR 101 | Temp 98.8°F | Resp 16

## 2015-07-01 DIAGNOSIS — Z452 Encounter for adjustment and management of vascular access device: Secondary | ICD-10-CM

## 2015-07-01 DIAGNOSIS — C184 Malignant neoplasm of transverse colon: Secondary | ICD-10-CM | POA: Diagnosis not present

## 2015-07-01 MED ORDER — HEPARIN SOD (PORK) LOCK FLUSH 100 UNIT/ML IV SOLN
500.0000 [IU] | Freq: Once | INTRAVENOUS | Status: AC | PRN
  Administered 2015-07-01: 500 [IU]

## 2015-07-01 MED ORDER — SODIUM CHLORIDE 0.9% FLUSH
10.0000 mL | INTRAVENOUS | Status: DC | PRN
  Administered 2015-07-01: 10 mL
  Filled 2015-07-01: qty 10

## 2015-07-01 MED ORDER — HEPARIN SOD (PORK) LOCK FLUSH 100 UNIT/ML IV SOLN
INTRAVENOUS | Status: AC
  Filled 2015-07-01: qty 5

## 2015-07-01 NOTE — Patient Instructions (Signed)
[place] at [facility] Discharge Instructions  RECOMMENDATIONS MADE BY THE CONSULTANT AND ANY TEST RESULTS WILL BE SENT TO YOUR REFERRING PHYSICIAN.  Port flush with pump removal today.    Thank you for choosing [place] at [facility] to provide your oncology and hematology care.  To afford each patient quality time with our provider, please arrive at least 15 minutes before your scheduled appointment time.   Beginning January 23rd 2017 lab work for the [facility] will be done in the  Main lab at [facility] on 1st floor. If you have a lab appointment with the [place] please come in thru the  Main Entrance and check in at the main information desk  You need to re-schedule your appointment should you arrive 10 or more minutes late.  We strive to give you quality time with our providers, and arriving late affects you and other patients whose appointments are after yours.  Also, if you no show three or more times for appointments you may be dismissed from the clinic at the providers discretion.     Again, thank you for choosing [facility].  Our hope is that these requests will decrease the amount of time that you wait before being seen by our physicians.       _____________________________________________________________  Should you have questions after your visit to [facility], please contact our office at [phone] between the hours of 8:30 a.m. and 4:30 p.m.  Voicemails left after 4:30 p.m. will not be returned until the following business day.  For prescription refill requests, have your pharmacy contact our office.         Resources For Cancer Patients and their Caregivers ? American Cancer Society: Can assist with transportation, wigs, general needs, runs Look Good Feel Better.        469 701 7896 ? Cancer Care: Provides financial assistance, online support groups, medication/co-pay  assistance.  1-800-813-HOPE 754-665-0870) ? [place] Assists [place] cancer patients and their families through emotional , educational and financial support.  613 292 2780 ? [place] DSS Where to apply for food stamps, Medicaid and utility assistance. (682)688-2226 ? RCATS: Transportation to medical appointments. 229-197-5622 ? Social Security Administration: May apply for disability if have a Stage IV cancer. (617)876-4430 503-211-2198 ? [employer], Disability and Transit Services: Assists with nutrition, care and transit needs. [place] Support Programs: @10RELATIVEDAYS @ > Cancer Support Group  2nd Tuesday of the month 1pm-2pm, Journey Room  > Creative Journey  3rd Tuesday of the month 1130am-1pm, Journey Room  > Look Good Feel Better  1st Wednesday of the month 10am-12 noon, Journey Room (Call [place] to register [phone])

## 2015-07-01 NOTE — Progress Notes (Signed)
Patient tolerated infusion well.  VSS.   

## 2015-07-13 ENCOUNTER — Encounter (HOSPITAL_COMMUNITY): Payer: Medicaid Other | Attending: Hematology & Oncology

## 2015-07-13 ENCOUNTER — Encounter (HOSPITAL_COMMUNITY): Payer: Self-pay

## 2015-07-13 VITALS — BP 179/87 | HR 93 | Temp 98.0°F | Resp 20 | Wt 215.0 lb

## 2015-07-13 DIAGNOSIS — Z5111 Encounter for antineoplastic chemotherapy: Secondary | ICD-10-CM

## 2015-07-13 DIAGNOSIS — C184 Malignant neoplasm of transverse colon: Secondary | ICD-10-CM | POA: Insufficient documentation

## 2015-07-13 LAB — CBC WITH DIFFERENTIAL/PLATELET
Basophils Absolute: 0 10*3/uL (ref 0.0–0.1)
Basophils Relative: 1 %
EOS ABS: 0.1 10*3/uL (ref 0.0–0.7)
EOS PCT: 1 %
HCT: 40.4 % (ref 39.0–52.0)
Hemoglobin: 13.2 g/dL (ref 13.0–17.0)
LYMPHS ABS: 1.1 10*3/uL (ref 0.7–4.0)
LYMPHS PCT: 16 %
MCH: 31.3 pg (ref 26.0–34.0)
MCHC: 32.7 g/dL (ref 30.0–36.0)
MCV: 95.7 fL (ref 78.0–100.0)
MONO ABS: 1 10*3/uL (ref 0.1–1.0)
MONOS PCT: 14 %
Neutro Abs: 4.7 10*3/uL (ref 1.7–7.7)
Neutrophils Relative %: 68 %
PLATELETS: 219 10*3/uL (ref 150–400)
RBC: 4.22 MIL/uL (ref 4.22–5.81)
RDW: 17.5 % — AB (ref 11.5–15.5)
WBC: 6.8 10*3/uL (ref 4.0–10.5)

## 2015-07-13 LAB — COMPREHENSIVE METABOLIC PANEL
ALBUMIN: 4 g/dL (ref 3.5–5.0)
ALT: 14 U/L — AB (ref 17–63)
AST: 25 U/L (ref 15–41)
Alkaline Phosphatase: 72 U/L (ref 38–126)
Anion gap: 9 (ref 5–15)
BUN: 10 mg/dL (ref 6–20)
CHLORIDE: 101 mmol/L (ref 101–111)
CO2: 27 mmol/L (ref 22–32)
CREATININE: 0.81 mg/dL (ref 0.61–1.24)
Calcium: 8.9 mg/dL (ref 8.9–10.3)
GFR calc Af Amer: >60 mL/min (ref >60)
GLUCOSE: 119 mg/dL — AB (ref 65–99)
POTASSIUM: 3.9 mmol/L (ref 3.5–5.1)
Sodium: 137 mmol/L (ref 135–145)
Total Bilirubin: 0.6 mg/dL (ref 0.3–1.2)
Total Protein: 7.8 g/dL (ref 6.5–8.1)

## 2015-07-13 MED ORDER — OXALIPLATIN CHEMO INJECTION 100 MG/20ML
72.0000 mg/m2 | Freq: Once | INTRAVENOUS | Status: AC
  Administered 2015-07-13: 165 mg via INTRAVENOUS
  Filled 2015-07-13: qty 33

## 2015-07-13 MED ORDER — SODIUM CHLORIDE 0.9% FLUSH: 10.0000 mL | INTRAVENOUS | Status: DC | PRN

## 2015-07-13 MED ORDER — DEXAMETHASONE SODIUM PHOSPHATE 100 MG/10ML IJ SOLN
10.0000 mg | Freq: Once | INTRAMUSCULAR | Status: AC
  Administered 2015-07-13: 10 mg via INTRAVENOUS
  Filled 2015-07-13: qty 1

## 2015-07-13 MED ORDER — DEXTROSE 5 % IV SOLN
Freq: Once | INTRAVENOUS | Status: AC
  Administered 2015-07-13: 11:00:00 via INTRAVENOUS

## 2015-07-13 MED ORDER — SODIUM CHLORIDE 0.9 % IV SOLN
2160.0000 mg/m2 | INTRAVENOUS | Status: DC
  Administered 2015-07-13: 4900 mg via INTRAVENOUS
  Filled 2015-07-13: qty 98

## 2015-07-13 MED ORDER — PALONOSETRON HCL INJECTION 0.25 MG/5ML
INTRAVENOUS | Status: AC
  Filled 2015-07-13: qty 5

## 2015-07-13 MED ORDER — DEXTROSE 5 % IV SOLN
400.0000 mg/m2 | Freq: Once | INTRAVENOUS | Status: AC
  Administered 2015-07-13: 912 mg via INTRAVENOUS
  Filled 2015-07-13: qty 45.6

## 2015-07-13 MED ORDER — PALONOSETRON HCL INJECTION 0.25 MG/5ML
0.2500 mg | Freq: Once | INTRAVENOUS | Status: AC
  Administered 2015-07-13: 0.25 mg via INTRAVENOUS

## 2015-07-13 NOTE — Patient Instructions (Signed)
[facility] Discharge Instructions for Patients Receiving Chemotherapy   Beginning January 23rd 2017 lab work for the [facility] will be done in the  Main lab at [facility] on 1st floor. If you have a lab appointment with the [facility] please come in thru the  Main Entrance and check in at the main information desk   Today you received the following chemotherapy agents: Oxaliplatin, fluorouracil, and leucovorin.   If you develop nausea and vomiting, or diarrhea that is not controlled by your medication, call the clinic.  The clinic phone number is [phone]. Office hours are Monday-Friday 8:30am-5:00pm.  BELOW ARE SYMPTOMS THAT SHOULD BE REPORTED IMMEDIATELY:  *FEVER GREATER THAN 101.0 F  *CHILLS WITH OR WITHOUT FEVER  NAUSEA AND VOMITING THAT IS NOT CONTROLLED WITH YOUR NAUSEA MEDICATION  *UNUSUAL SHORTNESS OF BREATH  *UNUSUAL BRUISING OR BLEEDING  TENDERNESS IN MOUTH AND THROAT WITH OR WITHOUT PRESENCE OF ULCERS  *URINARY PROBLEMS  *BOWEL PROBLEMS  UNUSUAL RASH Items with * indicate a potential emergency and should be followed up as soon as possible. If you have an emergency after office hours please contact your primary care physician or go to the nearest emergency department.  Please call the clinic during office hours if you have any questions or concerns.   You may also contact the Patient Navigator at [phone] should you have any questions or need assistance in obtaining follow up care.      Resources For Cancer Patients and their Caregivers ? American Cancer Society: Can assist with transportation, wigs, general needs, runs Look Good Feel Better.        270-180-4345 ? Cancer Care: Provides financial assistance, online support groups, medication/co-pay assistance.  1-800-813-HOPE 951-604-4246) ? [place] Assists [place] cancer patients and their families through emotional , educational and financial  support.  440-807-7256 ? [place] DSS Where to apply for food stamps, Medicaid and utility assistance. 458-540-9720 ? RCATS: Transportation to medical appointments. (437)333-1426 ? Social Security Administration: May apply for disability if have a Stage IV cancer. (650)268-8522 864-782-9864 ? [employer], Disability and Transit Services: Assists with nutrition, care and transit needs. 325-010-4816

## 2015-07-13 NOTE — Progress Notes (Signed)
Patient tolerated infusion well.  VSS.   

## 2015-07-15 ENCOUNTER — Encounter (HOSPITAL_BASED_OUTPATIENT_CLINIC_OR_DEPARTMENT_OTHER): Payer: Medicaid Other

## 2015-07-15 ENCOUNTER — Encounter (HOSPITAL_COMMUNITY): Payer: Self-pay

## 2015-07-15 VITALS — BP 141/70 | HR 92 | Temp 98.0°F | Resp 18

## 2015-07-15 DIAGNOSIS — C184 Malignant neoplasm of transverse colon: Secondary | ICD-10-CM

## 2015-07-15 DIAGNOSIS — Z452 Encounter for adjustment and management of vascular access device: Secondary | ICD-10-CM

## 2015-07-15 MED ORDER — SODIUM CHLORIDE 0.9% FLUSH
10.0000 mL | INTRAVENOUS | Status: DC | PRN
  Administered 2015-07-15: 10 mL
  Filled 2015-07-15: qty 10

## 2015-07-15 MED ORDER — HEPARIN SOD (PORK) LOCK FLUSH 100 UNIT/ML IV SOLN
INTRAVENOUS | Status: AC
  Filled 2015-07-15: qty 5

## 2015-07-15 MED ORDER — HEPARIN SOD (PORK) LOCK FLUSH 100 UNIT/ML IV SOLN
500.0000 [IU] | Freq: Once | INTRAVENOUS | Status: AC | PRN
  Administered 2015-07-15: 500 [IU]

## 2015-07-15 NOTE — Patient Instructions (Signed)
[facility] at [facility] Discharge Instructions  RECOMMENDATIONS MADE BY THE CONSULTANT AND ANY TEST RESULTS WILL BE SENT TO YOUR REFERRING PHYSICIAN.  Pump removed Follow up as scheduled Please call the clinic if you have any questions or concerns   Thank you for choosing [facility] at [facility] to provide your oncology and hematology care.  To afford each patient quality time with our provider, please arrive at least 15 minutes before your scheduled appointment time.   Beginning January 23rd 2017 lab work for the Cancer Center will be done in the  Main lab at [place] on 1st floor. If you have a lab appointment with the Cancer Center please come in thru the  Main Entrance and check in at the main information desk  You need to re-schedule your appointment should you arrive 10 or more minutes late.  We strive to give you quality time with our providers, and arriving late affects you and other patients whose appointments are after yours.  Also, if you no show three or more times for appointments you may be dismissed from the clinic at the providers discretion.     Again, thank you for choosing [facility].  Our hope is that these requests will decrease the amount of time that you wait before being seen by our physicians.       _____________________________________________________________  Should you have questions after your visit to [facility], please contact our office at [phone] between the hours of 8:30 a.m. and 4:30 p.m.  Voicemails left after 4:30 p.m. will not be returned until the following business day.  For prescription refill requests, have your pharmacy contact our office.         Resources For Cancer Patients and their Caregivers ? American Cancer Society: Can assist with transportation, wigs, general needs, runs Look Good Feel Better.        1-888-227-6333 ? Cancer Care: Provides  financial assistance, online support groups, medication/co-pay assistance.  1-800-813-HOPE (4673) ? [facility] Assists [place] cancer patients and their families through emotional , educational and financial support.  336-427-4357 ? [place] DSS Where to apply for food stamps, Medicaid and utility assistance. 336-342-1394 ? RCATS: Transportation to medical appointments. 336-347-2287 ? Social Security Administration: May apply for disability if have a Stage IV cancer. 336-342-7796 1-800-772-1213 ? [place] Aging, Disability and Transit Services: Assists with nutrition, care and transit needs. 336-349-2343  Cancer Center Support Programs: @10RELATIVEDAYS@ > Cancer Support Group  2nd Tuesday of the month 1pm-2pm, Journey Room  > Creative Journey  3rd Tuesday of the month 1130am-1pm, Journey Room  > Look Good Feel Better  1st Wednesday of the month 10am-12 noon, Journey Room (Call American Cancer Society to register 1-800-395-5775)    

## 2015-07-15 NOTE — Progress Notes (Signed)
Erik [name] presented for Portacath de-access and flush.  Proper placement of portacath confirmed by CXR.  Portacath located right chest wall accessed with  H 20 needle.  Good blood return present. Portacath flushed with 48ml NS and 500U/63ml Heparin and needle removed intact.  Procedure tolerated well and without incident.  Pump removed

## 2015-07-19 ENCOUNTER — Other Ambulatory Visit: Payer: Self-pay | Admitting: Physician Assistant

## 2015-07-27 ENCOUNTER — Encounter (HOSPITAL_COMMUNITY): Payer: Self-pay

## 2015-07-27 ENCOUNTER — Encounter: Payer: Self-pay | Admitting: *Deleted

## 2015-07-27 ENCOUNTER — Encounter (HOSPITAL_BASED_OUTPATIENT_CLINIC_OR_DEPARTMENT_OTHER): Payer: Medicaid Other

## 2015-07-27 VITALS — BP 162/84 | HR 93 | Temp 98.0°F | Resp 18 | Wt 224.0 lb

## 2015-07-27 DIAGNOSIS — E538 Deficiency of other specified B group vitamins: Secondary | ICD-10-CM

## 2015-07-27 DIAGNOSIS — C184 Malignant neoplasm of transverse colon: Secondary | ICD-10-CM

## 2015-07-27 DIAGNOSIS — Z5111 Encounter for antineoplastic chemotherapy: Secondary | ICD-10-CM | POA: Diagnosis not present

## 2015-07-27 LAB — COMPREHENSIVE METABOLIC PANEL
ALBUMIN: 3.6 g/dL (ref 3.5–5.0)
ALK PHOS: 69 U/L (ref 38–126)
ALT: 11 U/L — AB (ref 17–63)
AST: 18 U/L (ref 15–41)
Anion gap: 6 (ref 5–15)
BILIRUBIN TOTAL: 0.7 mg/dL (ref 0.3–1.2)
BUN: 11 mg/dL (ref 6–20)
CALCIUM: 8.8 mg/dL — AB (ref 8.9–10.3)
CO2: 30 mmol/L (ref 22–32)
CREATININE: 0.77 mg/dL (ref 0.61–1.24)
Chloride: 102 mmol/L (ref 101–111)
GFR calc Af Amer: >60 mL/min (ref >60)
GLUCOSE: 101 mg/dL — AB (ref 65–99)
POTASSIUM: 3.7 mmol/L (ref 3.5–5.1)
Sodium: 138 mmol/L (ref 135–145)
TOTAL PROTEIN: 6.9 g/dL (ref 6.5–8.1)

## 2015-07-27 LAB — CBC WITH DIFFERENTIAL/PLATELET
Basophils Absolute: 0 10*3/uL (ref 0.0–0.1)
Basophils Relative: 0 %
Eosinophils Absolute: 0.1 10*3/uL (ref 0.0–0.7)
Eosinophils Relative: 1 %
HEMATOCRIT: 37.4 % — AB (ref 39.0–52.0)
HEMOGLOBIN: 12.4 g/dL — AB (ref 13.0–17.0)
Lymphocytes Relative: 16 %
Lymphs Abs: 1.2 10*3/uL (ref 0.7–4.0)
MCH: 32.1 pg (ref 26.0–34.0)
MCHC: 33.2 g/dL (ref 30.0–36.0)
MCV: 96.9 fL (ref 78.0–100.0)
MONO ABS: 1 10*3/uL (ref 0.1–1.0)
Monocytes Relative: 12 %
NEUTROS ABS: 5.5 10*3/uL (ref 1.7–7.7)
NEUTROS PCT: 71 %
Platelets: 174 10*3/uL (ref 150–400)
RBC: 3.86 MIL/uL — ABNORMAL LOW (ref 4.22–5.81)
RDW: 15.7 % — ABNORMAL HIGH (ref 11.5–15.5)
WBC: 7.7 10*3/uL (ref 4.0–10.5)

## 2015-07-27 MED ORDER — CYANOCOBALAMIN 1000 MCG/ML IJ SOLN
1000.0000 ug | Freq: Once | INTRAMUSCULAR | Status: AC
  Administered 2015-07-27: 1000 ug via INTRAMUSCULAR
  Filled 2015-07-27: qty 1

## 2015-07-27 MED ORDER — LEUCOVORIN CALCIUM INJECTION 350 MG
400.0000 mg/m2 | Freq: Once | INTRAVENOUS | Status: AC
  Administered 2015-07-27: 912 mg via INTRAVENOUS
  Filled 2015-07-27: qty 45.6

## 2015-07-27 MED ORDER — HEPARIN SOD (PORK) LOCK FLUSH 100 UNIT/ML IV SOLN: 500.0000 [IU] | Freq: Once | INTRAVENOUS | Status: DC | PRN

## 2015-07-27 MED ORDER — PALONOSETRON HCL INJECTION 0.25 MG/5ML
0.2500 mg | Freq: Once | INTRAVENOUS | Status: AC
  Administered 2015-07-27: 0.25 mg via INTRAVENOUS
  Filled 2015-07-27: qty 5

## 2015-07-27 MED ORDER — DEXTROSE 5 % IV SOLN
Freq: Once | INTRAVENOUS | Status: AC
  Administered 2015-07-27: 11:00:00 via INTRAVENOUS

## 2015-07-27 MED ORDER — SODIUM CHLORIDE 0.9% FLUSH: 10.0000 mL | INTRAVENOUS | Status: DC | PRN

## 2015-07-27 MED ORDER — DEXTROSE 5 % IV SOLN
72.0000 mg/m2 | Freq: Once | INTRAVENOUS | Status: AC
  Administered 2015-07-27: 165 mg via INTRAVENOUS
  Filled 2015-07-27: qty 33

## 2015-07-27 MED ORDER — SODIUM CHLORIDE 0.9 % IV SOLN
10.0000 mg | Freq: Once | INTRAVENOUS | Status: AC
  Administered 2015-07-27: 10 mg via INTRAVENOUS
  Filled 2015-07-27: qty 1

## 2015-07-27 MED ORDER — SODIUM CHLORIDE 0.9 % IV SOLN
2160.0000 mg/m2 | INTRAVENOUS | Status: DC
  Administered 2015-07-27: 4900 mg via INTRAVENOUS
  Filled 2015-07-27: qty 98

## 2015-07-27 NOTE — Progress Notes (Signed)
[place] Clinical Social Work  Clinical Social Work was referred by [place] rounding in the infusion room and met briefly in the infusion room with pt for assessment of psychosocial needs. Pt reports to be doing well and denied current needs. Clinical Social Worker reminded pt of upcoming painting class next week. Pt will consider registering for the class and will reach out as needed.   Clinical Social Work interventions: Check in   [place], [place] Tuesdays   [phone]

## 2015-07-27 NOTE — Patient Instructions (Signed)
[facility] Discharge Instructions for Patients Receiving Chemotherapy   Beginning January 23rd 2017 lab work for the [facility] will be done in the  Main lab at [facility] on 1st floor. If you have a lab appointment with the [place] please come in thru the  Main Entrance and check in at the main information desk   Today you received the following chemotherapy agents folfox B12 today B12 monthly Follow up as scheduled Please call the clinic if you have any questions or concerns   To help prevent nausea and vomiting after your treatment, we encourage you to take your nausea medication    If you develop nausea and vomiting, or diarrhea that is not controlled by your medication, call the clinic.  The clinic phone number is [phone]. Office hours are Monday-Friday 8:30am-5:00pm.  BELOW ARE SYMPTOMS THAT SHOULD BE REPORTED IMMEDIATELY:  *FEVER GREATER THAN 101.0 F  *CHILLS WITH OR WITHOUT FEVER  NAUSEA AND VOMITING THAT IS NOT CONTROLLED WITH YOUR NAUSEA MEDICATION  *UNUSUAL SHORTNESS OF BREATH  *UNUSUAL BRUISING OR BLEEDING  TENDERNESS IN MOUTH AND THROAT WITH OR WITHOUT PRESENCE OF ULCERS  *URINARY PROBLEMS  *BOWEL PROBLEMS  UNUSUAL RASH Items with * indicate a potential emergency and should be followed up as soon as possible. If you have an emergency after office hours please contact your primary care physician or go to the nearest emergency department.  Please call the clinic during office hours if you have any questions or concerns.   You may also contact the Patient Navigator at [phone] should you have any questions or need assistance in obtaining follow up care.      Resources For Cancer Patients and their Caregivers ? American Cancer Society: Can assist with transportation, wigs, general needs, runs Look Good Feel Better.        337-251-4611 ? Cancer Care: Provides financial assistance, online support groups,  medication/co-pay assistance.  1-800-813-HOPE 5413318656) ? [place] Assists [place] cancer patients and their families through emotional , educational and financial support.  854-502-9892 ? [place] DSS Where to apply for food stamps, Medicaid and utility assistance. 506-445-5394 ? RCATS: Transportation to medical appointments. 220-031-0914 ? Social Security Administration: May apply for disability if have a Stage IV cancer. [phone] 585-066-4937 ? [employer], Disability and Transit Services: Assists with nutrition, care and transit needs. [phone]

## 2015-07-27 NOTE — Progress Notes (Signed)
Erik [name] Audi Tolerated chemotherapy well today Discharged ambulatory with pump

## 2015-07-29 ENCOUNTER — Other Ambulatory Visit (HOSPITAL_COMMUNITY): Payer: Self-pay | Admitting: Oncology

## 2015-07-29 ENCOUNTER — Ambulatory Visit: Payer: Self-pay | Admitting: Physician Assistant

## 2015-07-29 ENCOUNTER — Encounter (HOSPITAL_BASED_OUTPATIENT_CLINIC_OR_DEPARTMENT_OTHER): Payer: Medicaid Other

## 2015-07-29 ENCOUNTER — Telehealth: Payer: Self-pay | Admitting: Cardiology

## 2015-07-29 VITALS — BP 122/68 | HR 85 | Temp 98.4°F | Resp 18

## 2015-07-29 DIAGNOSIS — C184 Malignant neoplasm of transverse colon: Secondary | ICD-10-CM

## 2015-07-29 DIAGNOSIS — Z09 Encounter for follow-up examination after completed treatment for conditions other than malignant neoplasm: Secondary | ICD-10-CM

## 2015-07-29 DIAGNOSIS — Z79899 Other long term (current) drug therapy: Secondary | ICD-10-CM

## 2015-07-29 MED ORDER — HYDROCODONE-ACETAMINOPHEN 5-325 MG PO TABS: 1.0000 | ORAL_TABLET | Freq: Four times a day (QID) | ORAL | Status: DC | PRN

## 2015-07-29 MED ORDER — SODIUM CHLORIDE 0.9% FLUSH
10.0000 mL | INTRAVENOUS | Status: DC | PRN
  Administered 2015-07-29: 10 mL
  Filled 2015-07-29: qty 10

## 2015-07-29 MED ORDER — HEPARIN SOD (PORK) LOCK FLUSH 100 UNIT/ML IV SOLN
500.0000 [IU] | Freq: Once | INTRAVENOUS | Status: AC | PRN
  Administered 2015-07-29: 500 [IU]

## 2015-07-29 MED ORDER — HEPARIN SOD (PORK) LOCK FLUSH 100 UNIT/ML IV SOLN
INTRAVENOUS | Status: AC
  Filled 2015-07-29: qty 5

## 2015-07-29 MED ORDER — HEPARIN SOD (PORK) LOCK FLUSH 100 UNIT/ML IV SOLN
INTRAVENOUS | Status: AC
Start: 2015-07-29 — End: 2015-07-29
  Filled 2015-07-29: qty 5

## 2015-07-29 NOTE — Progress Notes (Signed)
Tolerated 58fu infusion without any problems. Pt did c/o of having upper jaw feeling like it was going to lock up after the first couple of bites of food yday. After that it stopped. He said it scared him a little.

## 2015-07-29 NOTE — Congregational Nurse Program (Signed)
Congregational Nurse Program Note  Date of Encounter: 07/29/2015  Past Medical History: Past Medical History  Diagnosis Date  . Head trauma 2001    closed head injury; coma for 4 weeks  . Depression   . Abnormal stress echocardiogram   . Hypercholesterolemia   . Hypertension   . Alcohol abuse     Heavy Use up until 2010  . GERD (gastroesophageal reflux disease)   . PTSD (post-traumatic stress disorder)   . Anxiety   . Adenocarcinoma of transverse colon ([place]) 02/10/2015    Encounter Details:     CNP Questionnaire - 07/29/15 1627    Patient Demographics   Is this a new or existing patient? Existing   Patient is considered a/an Not Applicable   Race Caucasian/White   Patient Assistance   Location of Patient Assistance Not Applicable   Patient's financial/insurance status Low Income;Medicaid   Uninsured Patient No   Patient referred to apply for the following financial assistance Not Applicable   Food insecurities addressed Not Applicable   Transportation assistance No   Assistance securing medications Yes   Type of [place]  client has  copays with his medicaid and no income.Marland Kitchenassisted with client's co-pays so he is not without medications   Educational health offerings Not Applicable   Encounter Details   Primary purpose of visit Education/Health Concerns;Spiritual Care/Support Visit;Other   Was an Emergency Department visit averted? Not Applicable   Does patient have a medical provider? Yes   Patient referred to Not Applicable   Was a mental health screening completed? (GAINS tool) No   Does patient have dental issues? No   Does patient have vision issues? No   Does your patient have an abnormal blood pressure today? No   Since previous encounter, have you referred patient for abnormal blood pressure that resulted in a new diagnosis or medication change? No   Does your patient have an abnormal blood glucose today? No   Since previous encounter,  have you referred patient for abnormal blood glucose that resulted in a new diagnosis or medication change? No   Was there a life-saving intervention made? No      Client still receiving Chemotherapy for treatment of colon cancer. He has received Medicaid and is awaiting his Disability hearing in August , 2017. Now having medicaid , he no longer qualifies for the Medication Assistance programs and has $3.00 co-pays for his medications. He is currently out of most of his medications. Client has no income coming into his household. He is receiving Section 8 housing which pays his rent and utilities only. Client is extremely discouraged and sees getting medicaid as a major hurdle. Encouragement given to client. [facility] program will contact [place] and help with co-pays for pro-air inhaler, amitriptyline, Dexilant and fludrocortisone for a total of $12.00 in co-pays. RN will attempt to find any alternatives to assist client with co-pays with his medicaid for medications. Will continue to follow up with client as needed.

## 2015-07-29 NOTE — Telephone Encounter (Signed)
Yes,pt should stay on florinef,I called pt

## 2015-07-29 NOTE — Progress Notes (Signed)
Pt has concerns of where he is to get his medicines from since he is no longer eligible for MedAssist due to him now having Medicaid.  Pt's meds were reviewed.  Pt was educated on how to read his medication bottles. Pt was demonstrated on where he could find the following: -name of pharmacy where med was last filled -pharmacy phone number -provider who wrote the rx -name of med and dosage -medication instructions -date of when med was lastt filled - # of refills remaining -Rx #  LPN had the patient place all his medication bottles on the counter for review. Patient had 10 empty bottles of multiple medications that needed to be disposed of. LPN had pt dispose of all empty bottles in the garbage can while reviewing bottle. Pt also had 3 bottles filled with meds which were discontinued and/or expired. LPN kept those bottles in order to dispose of accordingly.  Emphasis was placed on the importance of disposing empty bottles and d/c and/or expired medications. pt was also educated on how and where to dispose of medication bottles that still contained medications. Pt verbalized understanding.  LPN called [place] to transfer Atorvastatin, Prozac, and Proventil prescriptions from [place] and amitriptyline transferred from [facility].  Pt was educated on how to get refills from his pharmacy, and he is go get refills on meds he is out of.  Pt is to ask: -Dr. Harl Bowie about continuation of Fludrocortisone -Dr. Whitney Muse about continuation of Potassium Since patient was not sure if he was told to d/c.   Pt was encouraged to make our office aware if he continued to have any difficulties with keeping up with his meds. Pt understood.

## 2015-07-29 NOTE — Telephone Encounter (Signed)
Patient has questions regarding his Fludrocortisone. Wants to know if he is to continue taking it. / tg

## 2015-08-09 ENCOUNTER — Encounter (HOSPITAL_COMMUNITY): Payer: Medicaid Other | Attending: Hematology & Oncology

## 2015-08-09 ENCOUNTER — Ambulatory Visit (HOSPITAL_COMMUNITY): Payer: Medicaid Other

## 2015-08-09 ENCOUNTER — Encounter (HOSPITAL_COMMUNITY): Payer: Medicaid Other | Attending: Oncology | Admitting: Oncology

## 2015-08-09 ENCOUNTER — Encounter (HOSPITAL_COMMUNITY): Payer: Self-pay

## 2015-08-09 VITALS — BP 134/80 | HR 80 | Temp 97.7°F | Resp 18 | Wt 223.0 lb

## 2015-08-09 DIAGNOSIS — C184 Malignant neoplasm of transverse colon: Secondary | ICD-10-CM

## 2015-08-09 DIAGNOSIS — Z5111 Encounter for antineoplastic chemotherapy: Secondary | ICD-10-CM

## 2015-08-09 LAB — CBC WITH DIFFERENTIAL/PLATELET
BASOS PCT: 1 %
Basophils Absolute: 0 10*3/uL (ref 0.0–0.1)
EOS ABS: 0.1 10*3/uL (ref 0.0–0.7)
EOS PCT: 2 %
HCT: 39.9 % (ref 39.0–52.0)
Hemoglobin: 13.2 g/dL (ref 13.0–17.0)
Lymphocytes Relative: 20 %
Lymphs Abs: 1.1 10*3/uL (ref 0.7–4.0)
MCH: 32.1 pg (ref 26.0–34.0)
MCHC: 33.1 g/dL (ref 30.0–36.0)
MCV: 97.1 fL (ref 78.0–100.0)
MONO ABS: 0.8 10*3/uL (ref 0.1–1.0)
MONOS PCT: 15 %
NEUTROS PCT: 62 %
Neutro Abs: 3.3 10*3/uL (ref 1.7–7.7)
PLATELETS: 143 10*3/uL — AB (ref 150–400)
RBC: 4.11 MIL/uL — ABNORMAL LOW (ref 4.22–5.81)
RDW: 14.8 % (ref 11.5–15.5)
WBC: 5.3 10*3/uL (ref 4.0–10.5)

## 2015-08-09 LAB — COMPREHENSIVE METABOLIC PANEL
ALK PHOS: 72 U/L (ref 38–126)
ALT: 12 U/L — AB (ref 17–63)
AST: 22 U/L (ref 15–41)
Albumin: 3.7 g/dL (ref 3.5–5.0)
Anion gap: 6 (ref 5–15)
BUN: 6 mg/dL (ref 6–20)
CALCIUM: 9 mg/dL (ref 8.9–10.3)
CO2: 28 mmol/L (ref 22–32)
CREATININE: 0.76 mg/dL (ref 0.61–1.24)
Chloride: 104 mmol/L (ref 101–111)
GFR calc non Af Amer: >60 mL/min (ref >60)
GLUCOSE: 107 mg/dL — AB (ref 65–99)
Potassium: 3.9 mmol/L (ref 3.5–5.1)
SODIUM: 138 mmol/L (ref 135–145)
Total Bilirubin: 0.4 mg/dL (ref 0.3–1.2)
Total Protein: 7.3 g/dL (ref 6.5–8.1)

## 2015-08-09 MED ORDER — DEXTROSE 5 % IV SOLN
72.0000 mg/m2 | Freq: Once | INTRAVENOUS | Status: AC
  Administered 2015-08-09: 165 mg via INTRAVENOUS
  Filled 2015-08-09: qty 33

## 2015-08-09 MED ORDER — DEXTROSE 5 % IV SOLN
Freq: Once | INTRAVENOUS | Status: AC
  Administered 2015-08-09: 12:00:00 via INTRAVENOUS

## 2015-08-09 MED ORDER — LEUCOVORIN CALCIUM INJECTION 350 MG
400.0000 mg/m2 | Freq: Once | INTRAMUSCULAR | Status: AC
  Administered 2015-08-09: 912 mg via INTRAVENOUS
  Filled 2015-08-09: qty 45.6

## 2015-08-09 MED ORDER — SODIUM CHLORIDE 0.9 % IV SOLN
2160.0000 mg/m2 | INTRAVENOUS | Status: DC
  Administered 2015-08-09: 4900 mg via INTRAVENOUS
  Filled 2015-08-09: qty 98

## 2015-08-09 MED ORDER — SODIUM CHLORIDE 0.9% FLUSH: 10.0000 mL | INTRAVENOUS | Status: DC | PRN

## 2015-08-09 MED ORDER — SODIUM CHLORIDE 0.9 % IV SOLN
10.0000 mg | Freq: Once | INTRAVENOUS | Status: AC
  Administered 2015-08-09: 10 mg via INTRAVENOUS
  Filled 2015-08-09: qty 1

## 2015-08-09 MED ORDER — PALONOSETRON HCL INJECTION 0.25 MG/5ML
0.2500 mg | Freq: Once | INTRAVENOUS | Status: AC
  Administered 2015-08-09: 0.25 mg via INTRAVENOUS

## 2015-08-09 NOTE — Patient Instructions (Signed)
[place] at [facility] Discharge Instructions  RECOMMENDATIONS MADE BY THE CONSULTANT AND ANY TEST RESULTS WILL BE SENT TO YOUR REFERRING PHYSICIAN.  Labs are stable today.  Today is cycle #10.  You have 2 more treatments left and then you are finished with chemotherapy at this time. Return in approximately 4 weeks for follow-up appointment.  Thank you for choosing [place] [address] at [facility] to provide your oncology and hematology care.  To afford each patient quality time with our provider, please arrive at least 15 minutes before your scheduled appointment time.   Beginning January 23rd 2017 lab work for the [facility] will be done in the  Main lab at [facility] on 1st floor. If you have a lab appointment with the [place] please come in thru the  Main Entrance and check in at the main information desk  You need to re-schedule your appointment should you arrive 10 or more minutes late.  We strive to give you quality time with our providers, and arriving late affects you and other patients whose appointments are after yours.  Also, if you no show three or more times for appointments you may be dismissed from the clinic at the providers discretion.     Again, thank you for choosing [facility].  Our hope is that these requests will decrease the amount of time that you wait before being seen by our physicians.       _____________________________________________________________  Should you have questions after your visit to [facility], please contact our office at [phone] between the hours of 8:30 a.m. and 4:30 p.m.  Voicemails left after 4:30 p.m. will not be returned until the following business day.  For prescription refill requests, have your pharmacy contact our office.         Resources For Cancer Patients and their Caregivers ? American Cancer Society: Can assist with transportation, wigs,  general needs, runs Look Good Feel Better.        726-665-8388 ? Cancer Care: Provides financial assistance, online support groups, medication/co-pay assistance.  1-800-813-HOPE 807-715-3255) ? [place] Assists [place] cancer patients and their families through emotional , educational and financial support.  3643811921 ? [place] DSS Where to apply for food stamps, Medicaid and utility assistance. (714) 520-5345 ? RCATS: Transportation to medical appointments. (385)803-7679 ? Social Security Administration: May apply for disability if have a Stage IV cancer. 219-791-1888 615-855-1610 ? [employer], Disability and Transit Services: Assists with nutrition, care and transit needs. [place] Support Programs: @10RELATIVEDAYS @ > Cancer Support Group  2nd Tuesday of the month 1pm-2pm, Journey Room  > Creative Journey  3rd Tuesday of the month 1130am-1pm, Journey Room  > Look Good Feel Better  1st Wednesday of the month 10am-12 noon, Journey Room (Call [place] to register [phone])

## 2015-08-09 NOTE — Progress Notes (Signed)
1410:  Tolerated tx w/o adverse reaction.  A&Ox4, in no distress.  VSS.  Discharged ambulatory.

## 2015-08-09 NOTE — Patient Instructions (Signed)
[facility] Discharge Instructions for Patients Receiving Chemotherapy   Beginning January 23rd 2017 lab work for the [facility] will be done in the  Main lab at [facility] on 1st floor. If you have a lab appointment with the [place] please come in thru the  Main Entrance and check in at the main information desk   Today you received the following chemotherapy agents:  Oxaliplatin, leucovorin, and 5FU  If you develop nausea and vomiting, or diarrhea that is not controlled by your medication, call the clinic.  The clinic phone number is [phone]. Office hours are Monday-Friday 8:30am-5:00pm.  BELOW ARE SYMPTOMS THAT SHOULD BE REPORTED IMMEDIATELY:  *FEVER GREATER THAN 101.0 F  *CHILLS WITH OR WITHOUT FEVER  NAUSEA AND VOMITING THAT IS NOT CONTROLLED WITH YOUR NAUSEA MEDICATION  *UNUSUAL SHORTNESS OF BREATH  *UNUSUAL BRUISING OR BLEEDING  TENDERNESS IN MOUTH AND THROAT WITH OR WITHOUT PRESENCE OF ULCERS  *URINARY PROBLEMS  *BOWEL PROBLEMS  UNUSUAL RASH Items with * indicate a potential emergency and should be followed up as soon as possible. If you have an emergency after office hours please contact your primary care physician or go to the nearest emergency department.  Please call the clinic during office hours if you have any questions or concerns.   You may also contact the Patient Navigator at [phone] should you have any questions or need assistance in obtaining follow up care.      Resources For Cancer Patients and their Caregivers ? American Cancer Society: Can assist with transportation, wigs, general needs, runs Look Good Feel Better.        762-572-1172 ? Cancer Care: Provides financial assistance, online support groups, medication/co-pay assistance.  1-800-813-HOPE (938)689-5663) ? [place] Assists [place] cancer patients and their families through emotional , educational and financial support.   (780)790-8837 ? [place] DSS Where to apply for food stamps, Medicaid and utility assistance. 570-757-3289 ? RCATS: Transportation to medical appointments. 909-846-1410 ? Social Security Administration: May apply for disability if have a Stage IV cancer. (743)137-1714 [phone])[phone] ? [employer], Disability and Transit Services: Assists with nutrition, care and transit needs. [phone]

## 2015-08-09 NOTE — Progress Notes (Signed)
[name], PA-C [place] [address]  Adenocarcinoma of transverse colon [facility])  CURRENT THERAPY: FOLFOX beginning on 03/30/2015  INTERVAL HISTORY: Erik [name] 60 y.o. male returns for followup of Stage IIIC [facility]) adenocarcinoma of transverse colon.    Adenocarcinoma of transverse colon ([place])   01/12/2015 Pathologic Stage Colon, biopsy, distal transverse - TUBULOVILLOUS ADENOMA WITH HIGH GRADE DYSPLASIA.   01/12/2015 Procedure Colonoscopy by Dr. Oneida Alar.   01/12/2015 Tumor Marker CEA: 6.6 (H)    01/18/2015 Imaging CT abd/pelvis- Apple-core lesion identified in the mid transverse colon without obstruction. No evidence for lymphadenopathy in the gastrohepatic ligament or omentum.  Stable 8 mm hypo attenuating lesion in the left liver, likely a cyst.   02/10/2015 Initial Diagnosis Adenocarcinoma of transverse colon ([place])   02/12/2015 Definitive Surgery [name], Extended right hemicolectomy    02/12/2015 Pathology Results Mucinous adenocarcinoma with penetration of visceral peritoneum, 4/19 lymph nodes for metasattic disease, negative resection margins, with LVI and perineural invasion   03/30/2015 -  Chemotherapy FOLFOX   05/25/2015 Treatment Plan Change 5 FU bolus discontinued for cycle #5   06/08/2015 Treatment Plan Change Treatment deferred x 1 week   06/15/2015 Treatment Plan Change 5FU CI decreased by 10% and Oxaliplatin reduced by 15%     He continues to tolerate treatment well.   He notes Oxaliplatin-induced peripheral neuropathy that resolves prior to each treatment.  He denies any nausea or vomiting.   Additionally, he notes some minor cold intolerance.  This too is Oxaliplatin-induced and this too also resolves prior to each treatment.  He denies any complaints today.  Review of Systems  Constitutional: Negative.   HENT: Negative.   Eyes: Negative.   Respiratory: Negative.   Cardiovascular: Negative.   Gastrointestinal: Negative.     Genitourinary: Negative.   Musculoskeletal: Negative.   Skin: Negative.   Neurological: Negative.   Endo/Heme/Allergies: Negative.   Psychiatric/Behavioral: Negative.     Past Medical History  Diagnosis Date  . Head trauma 2001    closed head injury; coma for 4 weeks  . Depression   . Abnormal stress echocardiogram   . Hypercholesterolemia   . Hypertension   . Alcohol abuse     Heavy Use up until 2010  . GERD (gastroesophageal reflux disease)   . PTSD (post-traumatic stress disorder)   . Anxiety   . Adenocarcinoma of transverse colon ([place]) 02/10/2015    Past Surgical History  Procedure Laterality Date  . Kidney surgery      >30 years ago  . Cardiac cath    . Craniotomy  2001  . Head injury surgery    . Colonoscopy  2011    Dr. Oneida Alar: multiple adenomas and hyperplastic polyps  . Colonoscopy with propofol N/A 01/12/2015    Procedure: COLONOSCOPY WITH PROPOFOL;  Surgeon: Danie Binder, MD;  [facility];  Service: Endoscopy;  Laterality: N/A;  1030  . Esophagogastroduodenoscopy (egd) with propofol N/A 01/12/2015    Procedure: ESOPHAGOGASTRODUODENOSCOPY (EGD) WITH PROPOFOL;  Surgeon: Danie Binder, MD;  Location: [facility];  Service: Endoscopy;  Laterality: N/A;  . Hernia repair Right 2012    Inguinal- [place] Na  . Laparoscopic right hemi colectomy Left 02/10/2015    Procedure: LAPAROSCOPIC THEN OPEN RIGHT HEMI COLECTOMY;  Surgeon: [name], MD;  Location: [facility] ORS;  Service: General;  Laterality: Left;  . Portacath placement N/A 03/24/2015    Procedure: INSERTION PORT-A-CATH;  Surgeon: Marjory Lies  Pabon, MD;  Location: [facility] ORS;  Service: General;  Laterality: N/A;    Family History  Problem Relation Age of Onset  . Ataxia Neg Hx   . Chorea Neg Hx   . Dementia Neg Hx   . Mental retardation Neg Hx   . Migraines Neg Hx   . Multiple sclerosis Neg Hx   . Neurofibromatosis Neg Hx   . Neuropathy Neg Hx   . Parkinsonism Neg Hx   . Seizures Neg Hx   .  Stroke Neg Hx   . Colon cancer Neg Hx   . Pulmonary embolism Mother   . Breast cancer Mother   . Heart disease Father   . Cancer Father 10    Leukemia  . Cancer Mother   . Cancer Paternal Grandfather     Lung    Social History   Social History  . Marital Status: Divorced    Spouse Name: N/A  . Number of Children: N/A  . Years of Education: N/A   Occupational History  . Full time    Social History Main Topics  . Smoking status: Current Some Day Smoker -- 0.10 packs/day    Types: Cigarettes  . Smokeless tobacco: Never Used  . Alcohol Use: 0.0 oz/week    0 Standard drinks or equivalent per week     Comment: beer occ, history of ETOH abuse in remote past.   . Drug Use: No     Comment: marijuana last 2012  . Sexual Activity:    Partners: Female   Other Topics Concern  . Not on file   Social History Narrative   Single     PHYSICAL EXAMINATION  ECOG PERFORMANCE STATUS: 1 - Symptomatic but completely ambulatory  There were no vitals filed for this visit.  BP 134/80 P 80 T 97.7 F R 18  GENERAL:alert, no distress, well nourished, well developed, comfortable, cooperative, smiling and unaccompanied, in chemotherapy-recliner.  Smelling of body odor and sweat. SKIN: skin color, texture, turgor are normal, no rashes or significant lesions HEAD: Normocephalic, No masses, lesions, tenderness or abnormalities EYES: normal, EOMI, Conjunctiva are pink and non-injected EARS: External ears normal OROPHARYNX:lips, buccal mucosa, and tongue normal and mucous membranes are moist  NECK: supple, trachea midline LYMPH:  not examined BREAST:not examined LUNGS: clear to auscultation and percussion HEART: regular rate & rhythm, no murmurs, no gallops, S1 normal and S2 normal ABDOMEN:abdomen soft and normal bowel sounds BACK: Back symmetric, no curvature. EXTREMITIES:less then 2 second capillary refill, no joint deformities, effusion, or inflammation, no skin discoloration, no  cyanosis  NEURO: alert & oriented x 3 with fluent speech, no focal motor/sensory deficits, gait normal   LABORATORY DATA: CBC    Component Value Date/Time   WBC 5.3 08/09/2015 1010   RBC 4.11* 08/09/2015 1010   HGB 13.2 08/09/2015 1010   HCT 39.9 08/09/2015 1010   PLT 143* 08/09/2015 1010   MCV 97.1 08/09/2015 1010   MCH 32.1 08/09/2015 1010   MCHC 33.1 08/09/2015 1010   RDW 14.8 08/09/2015 1010   LYMPHSABS 1.1 08/09/2015 1010   MONOABS 0.8 08/09/2015 1010   EOSABS 0.1 08/09/2015 1010   BASOSABS 0.0 08/09/2015 1010      Chemistry      Component Value Date/Time   NA 138 08/09/2015 1010   K 3.9 08/09/2015 1010   CL 104 08/09/2015 1010   CO2 28 08/09/2015 1010   BUN 6 08/09/2015 1010   CREATININE 0.76 08/09/2015 1010   CREATININE 0.86  12/16/2014 0844      Component Value Date/Time   CALCIUM 9.0 08/09/2015 1010   ALKPHOS 72 08/09/2015 1010   AST 22 08/09/2015 1010   ALT 12* 08/09/2015 1010   BILITOT 0.4 08/09/2015 1010        PENDING LABS:   RADIOGRAPHIC STUDIES:  No results found.   PATHOLOGY:    ASSESSMENT AND PLAN:  Adenocarcinoma of transverse colon ([place]) Stage IIIC [id number]) adenocarcinoma of transverse colon, S/P definitive surgery with a right extended hemicolectomy by Dr. [name].  Now on systemic adjuvant therapy consisting of FOLFOX.  Oncology history is updated.  Pre-treatment labs as ordered: CBC diff, CMET.  I personally reviewed and went over laboratory results with the patient.  The results are noted within this dictation.  Labs satisfy treatment criteria today.  Pre-treatment labs as ordered: CBC diff, CMET every 2 weeks  Return in 4 weeks for follow-up and      ORDERS PLACED FOR THIS ENCOUNTER: No orders of the defined types were placed in this encounter.    MEDICATIONS PRESCRIBED THIS ENCOUNTER: No orders of the defined types were placed in this encounter.    THERAPY PLAN:  Continue with treatment as planned.  All  questions were answered. The patient knows to call the clinic with any problems, questions or concerns. We can certainly see the patient much sooner if necessary.  Patient and plan discussed with Dr. Ancil Linsey and she is in agreement with the aforementioned.   This note is electronically signed by: Doy Mince 08/09/2015 7:12 PM

## 2015-08-09 NOTE — Assessment & Plan Note (Addendum)
Stage IIIC [facility]) adenocarcinoma of transverse colon, S/P definitive surgery with a right extended hemicolectomy by Dr. [name].  Now on systemic adjuvant therapy consisting of FOLFOX.  Oncology history is updated.  Pre-treatment labs as ordered: CBC diff, CMET.  I personally reviewed and went over laboratory results with the patient.  The results are noted within this dictation.  Labs satisfy treatment criteria today.  Pre-treatment labs as ordered: CBC diff, CMET every 2 weeks  Return in 4 weeks for follow-up and

## 2015-08-11 ENCOUNTER — Encounter (HOSPITAL_COMMUNITY): Payer: Medicaid Other | Attending: Hematology & Oncology

## 2015-08-11 ENCOUNTER — Encounter (HOSPITAL_COMMUNITY): Payer: Self-pay

## 2015-08-11 VITALS — BP 136/66 | HR 86 | Resp 16

## 2015-08-11 DIAGNOSIS — C184 Malignant neoplasm of transverse colon: Secondary | ICD-10-CM

## 2015-08-11 DIAGNOSIS — Z452 Encounter for adjustment and management of vascular access device: Secondary | ICD-10-CM

## 2015-08-11 MED ORDER — HEPARIN SOD (PORK) LOCK FLUSH 100 UNIT/ML IV SOLN
500.0000 [IU] | Freq: Once | INTRAVENOUS | Status: AC | PRN
Start: 2015-08-11 — End: 2015-08-11
  Administered 2015-08-11: 500 [IU]

## 2015-08-11 MED ORDER — HEPARIN SOD (PORK) LOCK FLUSH 100 UNIT/ML IV SOLN
INTRAVENOUS | Status: AC
  Filled 2015-08-11: qty 5

## 2015-08-11 MED ORDER — SODIUM CHLORIDE 0.9% FLUSH
10.0000 mL | INTRAVENOUS | Status: DC | PRN
  Administered 2015-08-11: 10 mL
  Filled 2015-08-11: qty 10

## 2015-08-11 NOTE — Patient Instructions (Signed)
[facility] at [facility] Discharge Instructions  RECOMMENDATIONS MADE BY THE CONSULTANT AND ANY TEST RESULTS WILL BE SENT TO YOUR REFERRING PHYSICIAN.  Pump removal and port flush today. Return as scheduled for chemotherapy and office visit. Call the clinic should you have any questions or concerns prior to your next visit.   Thank you for choosing  [facility] at [facility] to provide your oncology and hematology care.  To afford each patient quality time with our provider, please arrive at least 15 minutes before your scheduled appointment time.   Beginning January 23rd 2017 lab work for the Cancer Center will be done in the  Main lab at [facility] on 1st floor. If you have a lab appointment with the Cancer Center please come in thru the  Main Entrance and check in at the main information desk  You need to re-schedule your appointment should you arrive 10 or more minutes late.  We strive to give you quality time with our providers, and arriving late affects you and other patients whose appointments are after yours.  Also, if you no show three or more times for appointments you may be dismissed from the clinic at the providers discretion.     Again, thank you for choosing [facility].  Our hope is that these requests will decrease the amount of time that you wait before being seen by our physicians.       _____________________________________________________________  Should you have questions after your visit to  Cancer Center, please contact our office at [phone] between the hours of 8:30 a.m. and 4:30 p.m.  Voicemails left after 4:30 p.m. will not be returned until the following business day.  For prescription refill requests, have your pharmacy contact our office.         Resources For Cancer Patients and their Caregivers ? American Cancer Society: Can assist with transportation, wigs, general needs, runs  Look Good Feel Better.        1-888-227-6333 ? Cancer Care: Provides financial assistance, online support groups, medication/co-pay assistance.  1-800-813-HOPE (4673) ? [facility] Assists [place] cancer patients and their families through emotional , educational and financial support.  336-427-4357 ? [place] DSS Where to apply for food stamps, Medicaid and utility assistance. 336-342-1394 ? RCATS: Transportation to medical appointments. 336-347-2287 ? Social Security Administration: May apply for disability if have a Stage IV cancer. 336-342-7796 1-800-772-1213 ? [place] Aging, Disability and Transit Services: Assists with nutrition, care and transit needs. 336-349-2343  Cancer Center Support Programs: @10RELATIVEDAYS@ > Cancer Support Group  2nd Tuesday of the month 1pm-2pm, Journey Room  > Creative Journey  3rd Tuesday of the month 1130am-1pm, Journey Room  > Look Good Feel Better  1st Wednesday of the month 10am-12 noon, Journey Room (Call American Cancer [employer] to register 1-800-395-5775)   

## 2015-08-11 NOTE — Progress Notes (Signed)
1415:  Erik [name] presents to have home infusion pump d/c'd and for port-a-cath deaccess with flush.  Portacath located right chest wall accessed with  H 20 needle.  Good blood return present. Portacath flushed with NS and 500U/63ml Heparin, and needle removed intact.  Procedure tolerated well and without incident.

## 2015-08-23 ENCOUNTER — Encounter (HOSPITAL_COMMUNITY): Payer: Medicaid Other | Attending: Hematology & Oncology

## 2015-08-23 VITALS — BP 162/88 | HR 78 | Temp 98.2°F | Resp 18 | Wt 219.6 lb

## 2015-08-23 DIAGNOSIS — Z5111 Encounter for antineoplastic chemotherapy: Secondary | ICD-10-CM | POA: Diagnosis not present

## 2015-08-23 DIAGNOSIS — C184 Malignant neoplasm of transverse colon: Secondary | ICD-10-CM | POA: Insufficient documentation

## 2015-08-23 DIAGNOSIS — E538 Deficiency of other specified B group vitamins: Secondary | ICD-10-CM | POA: Diagnosis not present

## 2015-08-23 LAB — CBC WITH DIFFERENTIAL/PLATELET
Basophils Absolute: 0 K/uL (ref 0.0–0.1)
Basophils Relative: 0 %
Eosinophils Absolute: 0.1 K/uL (ref 0.0–0.7)
Eosinophils Relative: 1 %
HCT: 39.9 % (ref 39.0–52.0)
Hemoglobin: 13.3 g/dL (ref 13.0–17.0)
Lymphocytes Relative: 21 %
Lymphs Abs: 1.4 K/uL (ref 0.7–4.0)
MCH: 32.5 pg (ref 26.0–34.0)
MCHC: 33.3 g/dL (ref 30.0–36.0)
MCV: 97.6 fL (ref 78.0–100.0)
Monocytes Absolute: 0.8 K/uL (ref 0.1–1.0)
Monocytes Relative: 11 %
Neutro Abs: 4.3 K/uL (ref 1.7–7.7)
Neutrophils Relative %: 66 %
Platelets: 118 K/uL — ABNORMAL LOW (ref 150–400)
RBC: 4.09 MIL/uL — ABNORMAL LOW (ref 4.22–5.81)
RDW: 14.5 % (ref 11.5–15.5)
WBC: 6.6 K/uL (ref 4.0–10.5)

## 2015-08-23 LAB — COMPREHENSIVE METABOLIC PANEL
ALT: 12 U/L — AB (ref 17–63)
AST: 20 U/L (ref 15–41)
Albumin: 3.6 g/dL (ref 3.5–5.0)
Alkaline Phosphatase: 72 U/L (ref 38–126)
Anion gap: 7 (ref 5–15)
BUN: 9 mg/dL (ref 6–20)
CALCIUM: 9.1 mg/dL (ref 8.9–10.3)
CHLORIDE: 102 mmol/L (ref 101–111)
CO2: 28 mmol/L (ref 22–32)
CREATININE: 0.8 mg/dL (ref 0.61–1.24)
Glucose, Bld: 118 mg/dL — ABNORMAL HIGH (ref 65–99)
Potassium: 3.8 mmol/L (ref 3.5–5.1)
Sodium: 137 mmol/L (ref 135–145)
Total Bilirubin: 0.2 mg/dL — ABNORMAL LOW (ref 0.3–1.2)
Total Protein: 7 g/dL (ref 6.5–8.1)

## 2015-08-23 MED ORDER — OXALIPLATIN CHEMO INJECTION 100 MG/20ML
72.0000 mg/m2 | Freq: Once | INTRAVENOUS | Status: AC
  Administered 2015-08-23: 165 mg via INTRAVENOUS
  Filled 2015-08-23: qty 33

## 2015-08-23 MED ORDER — LEUCOVORIN CALCIUM INJECTION 350 MG
400.0000 mg/m2 | Freq: Once | INTRAVENOUS | Status: AC
  Administered 2015-08-23: 912 mg via INTRAVENOUS
  Filled 2015-08-23: qty 45.6

## 2015-08-23 MED ORDER — DEXAMETHASONE SODIUM PHOSPHATE 100 MG/10ML IJ SOLN
10.0000 mg | Freq: Once | INTRAMUSCULAR | Status: AC
  Administered 2015-08-23: 10 mg via INTRAVENOUS
  Filled 2015-08-23: qty 1

## 2015-08-23 MED ORDER — DEXTROSE 5 % IV SOLN
Freq: Once | INTRAVENOUS | Status: AC
  Administered 2015-08-23: 11:00:00 via INTRAVENOUS

## 2015-08-23 MED ORDER — SODIUM CHLORIDE 0.9% FLUSH
10.0000 mL | INTRAVENOUS | Status: DC | PRN
  Administered 2015-08-23: 10 mL
  Filled 2015-08-23: qty 10

## 2015-08-23 MED ORDER — PALONOSETRON HCL INJECTION 0.25 MG/5ML
0.2500 mg | Freq: Once | INTRAVENOUS | Status: AC
  Administered 2015-08-23: 0.25 mg via INTRAVENOUS
  Filled 2015-08-23: qty 5

## 2015-08-23 MED ORDER — CYANOCOBALAMIN 1000 MCG/ML IJ SOLN
INTRAMUSCULAR | Status: AC
  Filled 2015-08-23: qty 1

## 2015-08-23 MED ORDER — CYANOCOBALAMIN 1000 MCG/ML IJ SOLN
1000.0000 ug | Freq: Once | INTRAMUSCULAR | Status: AC
  Administered 2015-08-23: 1000 ug via INTRAMUSCULAR

## 2015-08-23 MED ORDER — SODIUM CHLORIDE 0.9 % IV SOLN
2160.0000 mg/m2 | INTRAVENOUS | Status: AC
  Administered 2015-08-23: 4900 mg via INTRAVENOUS
  Filled 2015-08-23: qty 98

## 2015-08-25 ENCOUNTER — Encounter (HOSPITAL_COMMUNITY): Payer: Self-pay

## 2015-08-25 ENCOUNTER — Encounter (HOSPITAL_BASED_OUTPATIENT_CLINIC_OR_DEPARTMENT_OTHER): Payer: Medicaid Other

## 2015-08-25 VITALS — BP 136/66 | HR 82 | Temp 98.0°F | Resp 18

## 2015-08-25 DIAGNOSIS — Z452 Encounter for adjustment and management of vascular access device: Secondary | ICD-10-CM

## 2015-08-25 DIAGNOSIS — C184 Malignant neoplasm of transverse colon: Secondary | ICD-10-CM | POA: Diagnosis not present

## 2015-08-25 MED ORDER — HEPARIN SOD (PORK) LOCK FLUSH 100 UNIT/ML IV SOLN
500.0000 [IU] | Freq: Once | INTRAVENOUS | Status: AC | PRN
  Administered 2015-08-25: 500 [IU]

## 2015-08-25 MED ORDER — SODIUM CHLORIDE 0.9% FLUSH
10.0000 mL | INTRAVENOUS | Status: DC | PRN
  Administered 2015-08-25: 10 mL
  Filled 2015-08-25: qty 10

## 2015-08-25 MED ORDER — HEPARIN SOD (PORK) LOCK FLUSH 100 UNIT/ML IV SOLN
INTRAVENOUS | Status: AC
  Filled 2015-08-25: qty 5

## 2015-08-25 NOTE — Progress Notes (Signed)
1200:  Erik [name] presents to have home infusion pump d/c'd and for port-a-cath deaccess with flush.  Portacath located right chest wall accessed with  H 20 needle.  Good blood return present. Portacath flushed with NS and 500U/73ml Heparin, and needle removed intact.  Procedure tolerated well and without incident.

## 2015-08-25 NOTE — Patient Instructions (Signed)
[place] at [facility] Discharge Instructions  RECOMMENDATIONS MADE BY THE CONSULTANT AND ANY TEST RESULTS WILL BE SENT TO YOUR REFERRING PHYSICIAN.  Pump removal and port flush today. Return as scheduled for chemotherapy. Call the clinic should you have any questions or concerns.   Thank you for choosing [place] at [facility] to provide your oncology and hematology care.  To afford each patient quality time with our provider, please arrive at least 15 minutes before your scheduled appointment time.   Beginning January 23rd 2017 lab work for the [facility] will be done in the  Main lab at [facility] on 1st floor. If you have a lab appointment with the [facility] please come in thru the  Main Entrance and check in at the main information desk  You need to re-schedule your appointment should you arrive 10 or more minutes late.  We strive to give you quality time with our providers, and arriving late affects you and other patients whose appointments are after yours.  Also, if you no show three or more times for appointments you may be dismissed from the clinic at the providers discretion.     Again, thank you for choosing [facility].  Our hope is that these requests will decrease the amount of time that you wait before being seen by our physicians.       _____________________________________________________________  Should you have questions after your visit to [facility], please contact our office at [phone] between the hours of 8:30 a.m. and 4:30 p.m.  Voicemails left after 4:30 p.m. will not be returned until the following business day.  For prescription refill requests, have your pharmacy contact our office.         Resources For Cancer Patients and their Caregivers ? American Cancer Society: Can assist with transportation, wigs, general needs, runs Look Good Feel Better.         825-249-6548 ? Cancer Care: Provides financial assistance, online support groups, medication/co-pay assistance.  1-800-813-HOPE 630-688-6965) ? [place] Assists [place] cancer patients and their families through emotional , educational and financial support.  716-392-5904 ? [place] DSS Where to apply for food stamps, Medicaid and utility assistance. 657-720-9432 ? RCATS: Transportation to medical appointments. 212-145-9444 ? Social Security Administration: May apply for disability if have a Stage IV cancer. 5708067119 6824048492 ? [employer], Disability and Transit Services: Assists with nutrition, care and transit needs. [place] Support Programs: @10RELATIVEDAYS @ > Cancer Support Group  2nd Tuesday of the month 1pm-2pm, Journey Room  > Creative Journey  3rd Tuesday of the month 1130am-1pm, Journey Room  > Look Good Feel Better  1st Wednesday of the month 10am-12 noon, Journey Room (Call [place] to register [phone])

## 2015-08-30 ENCOUNTER — Other Ambulatory Visit (HOSPITAL_COMMUNITY): Payer: Self-pay | Admitting: Oncology

## 2015-08-30 ENCOUNTER — Telehealth: Payer: Self-pay

## 2015-08-30 DIAGNOSIS — C184 Malignant neoplasm of transverse colon: Secondary | ICD-10-CM

## 2015-08-30 MED ORDER — ONDANSETRON HCL 8 MG PO TABS: 8.0000 mg | ORAL_TABLET | Freq: Three times a day (TID) | ORAL | 0 refills | Status: DC | PRN

## 2015-08-30 MED ORDER — PROCHLORPERAZINE MALEATE 10 MG PO TABS: 10.0000 mg | ORAL_TABLET | Freq: Four times a day (QID) | ORAL | 0 refills | Status: DC | PRN

## 2015-08-30 MED ORDER — AMITRIPTYLINE HCL 25 MG PO TABS: 25.0000 mg | ORAL_TABLET | Freq: Every day | ORAL | 1 refills | Status: DC

## 2015-08-30 MED ORDER — POTASSIUM CHLORIDE CRYS ER 20 MEQ PO TBCR: 40.0000 meq | EXTENDED_RELEASE_TABLET | Freq: Every day | ORAL | 2 refills | Status: DC

## 2015-08-30 NOTE — Telephone Encounter (Signed)
Erik [name] with [facility] called to let us know pt is switching to their pharmacy. She was requesting fax and phone for Dr Larey Seat. Fax and phone number given.

## 2015-09-02 ENCOUNTER — Other Ambulatory Visit (HOSPITAL_COMMUNITY): Payer: Self-pay | Admitting: Emergency Medicine

## 2015-09-02 NOTE — Progress Notes (Unsigned)
Pt called and stated that his fingertips and toes were tingling and numb.  This is the first time that this has happened.  Monday he is coming for his last cycle of chemotherapy.  I explained that this could be normal for the build up could cause him to have this.  To make sure to let DR Penland know when he sees her on Monday before chemotherapy.

## 2015-09-06 ENCOUNTER — Encounter (HOSPITAL_COMMUNITY): Payer: Self-pay | Admitting: Hematology & Oncology

## 2015-09-06 ENCOUNTER — Encounter (HOSPITAL_BASED_OUTPATIENT_CLINIC_OR_DEPARTMENT_OTHER): Payer: Medicaid Other | Admitting: Hematology & Oncology

## 2015-09-06 ENCOUNTER — Encounter (HOSPITAL_BASED_OUTPATIENT_CLINIC_OR_DEPARTMENT_OTHER): Payer: Medicaid Other

## 2015-09-06 VITALS — BP 131/80 | HR 81 | Temp 97.8°F | Resp 16 | Wt 217.0 lb

## 2015-09-06 VITALS — BP 132/69 | HR 82 | Temp 98.7°F | Resp 18

## 2015-09-06 DIAGNOSIS — Z5111 Encounter for antineoplastic chemotherapy: Secondary | ICD-10-CM

## 2015-09-06 DIAGNOSIS — E876 Hypokalemia: Secondary | ICD-10-CM

## 2015-09-06 DIAGNOSIS — E538 Deficiency of other specified B group vitamins: Secondary | ICD-10-CM | POA: Diagnosis not present

## 2015-09-06 DIAGNOSIS — C184 Malignant neoplasm of transverse colon: Secondary | ICD-10-CM

## 2015-09-06 DIAGNOSIS — Z8659 Personal history of other mental and behavioral disorders: Secondary | ICD-10-CM

## 2015-09-06 DIAGNOSIS — F329 Major depressive disorder, single episode, unspecified: Secondary | ICD-10-CM

## 2015-09-06 DIAGNOSIS — G620 Drug-induced polyneuropathy: Secondary | ICD-10-CM

## 2015-09-06 DIAGNOSIS — T451X5A Adverse effect of antineoplastic and immunosuppressive drugs, initial encounter: Secondary | ICD-10-CM

## 2015-09-06 LAB — CBC WITH DIFFERENTIAL/PLATELET
BASOS ABS: 0 10*3/uL (ref 0.0–0.1)
BASOS PCT: 1 %
EOS ABS: 0.2 10*3/uL (ref 0.0–0.7)
Eosinophils Relative: 3 %
HEMATOCRIT: 41.7 % (ref 39.0–52.0)
HEMOGLOBIN: 13.9 g/dL (ref 13.0–17.0)
Lymphocytes Relative: 18 %
Lymphs Abs: 1.2 10*3/uL (ref 0.7–4.0)
MCH: 31.7 pg (ref 26.0–34.0)
MCHC: 33.3 g/dL (ref 30.0–36.0)
MCV: 95.2 fL (ref 78.0–100.0)
MONOS PCT: 12 %
Monocytes Absolute: 0.8 10*3/uL (ref 0.1–1.0)
NEUTROS ABS: 4.3 10*3/uL (ref 1.7–7.7)
NEUTROS PCT: 66 %
Platelets: 129 10*3/uL — ABNORMAL LOW (ref 150–400)
RBC: 4.38 MIL/uL (ref 4.22–5.81)
RDW: 13.9 % (ref 11.5–15.5)
WBC: 6.5 10*3/uL (ref 4.0–10.5)

## 2015-09-06 LAB — COMPREHENSIVE METABOLIC PANEL
ALK PHOS: 75 U/L (ref 38–126)
ALT: 17 U/L (ref 17–63)
ANION GAP: 6 (ref 5–15)
AST: 21 U/L (ref 15–41)
Albumin: 3.8 g/dL (ref 3.5–5.0)
BILIRUBIN TOTAL: 0.6 mg/dL (ref 0.3–1.2)
BUN: 8 mg/dL (ref 6–20)
CALCIUM: 9 mg/dL (ref 8.9–10.3)
CO2: 28 mmol/L (ref 22–32)
CREATININE: 0.75 mg/dL (ref 0.61–1.24)
Chloride: 102 mmol/L (ref 101–111)
Glucose, Bld: 106 mg/dL — ABNORMAL HIGH (ref 65–99)
Potassium: 3.9 mmol/L (ref 3.5–5.1)
SODIUM: 136 mmol/L (ref 135–145)
TOTAL PROTEIN: 7.3 g/dL (ref 6.5–8.1)

## 2015-09-06 MED ORDER — SODIUM CHLORIDE 0.9% FLUSH: 10.0000 mL | INTRAVENOUS | Status: DC | PRN

## 2015-09-06 MED ORDER — PALONOSETRON HCL INJECTION 0.25 MG/5ML
0.2500 mg | Freq: Once | INTRAVENOUS | Status: AC
  Administered 2015-09-06: 0.25 mg via INTRAVENOUS

## 2015-09-06 MED ORDER — SODIUM CHLORIDE 0.9 % IV SOLN
2160.0000 mg/m2 | INTRAVENOUS | Status: DC
  Administered 2015-09-06: 4900 mg via INTRAVENOUS
  Filled 2015-09-06: qty 98

## 2015-09-06 MED ORDER — SODIUM CHLORIDE 0.9 % IV SOLN
INTRAVENOUS | Status: DC
  Administered 2015-09-06: 10:00:00 via INTRAVENOUS

## 2015-09-06 MED ORDER — LEUCOVORIN CALCIUM INJECTION 350 MG
400.0000 mg/m2 | Freq: Once | INTRAVENOUS | Status: AC
  Administered 2015-09-06: 912 mg via INTRAVENOUS
  Filled 2015-09-06: qty 45.6

## 2015-09-06 MED ORDER — PALONOSETRON HCL INJECTION 0.25 MG/5ML
INTRAVENOUS | Status: AC
  Filled 2015-09-06: qty 5

## 2015-09-06 MED ORDER — SODIUM CHLORIDE 0.9 % IV SOLN
10.0000 mg | Freq: Once | INTRAVENOUS | Status: AC
  Administered 2015-09-06: 10 mg via INTRAVENOUS
  Filled 2015-09-06: qty 1

## 2015-09-06 NOTE — Patient Instructions (Signed)
[facility] Discharge Instructions for Patients Receiving Chemotherapy   Beginning January 23rd 2017 lab work for the [facility] will be done in the  Main lab at [facility] on 1st floor. If you have a lab appointment with the [facility] please come in thru the  Main Entrance and check in at the main information desk   Today you received the following chemotherapy agents: leucovorin and fluorouracil.     If you develop nausea and vomiting, or diarrhea that is not controlled by your medication, call the clinic.  The clinic phone number is [phone]. Office hours are Monday-Friday 8:30am-5:00pm.  BELOW ARE SYMPTOMS THAT SHOULD BE REPORTED IMMEDIATELY:  *FEVER GREATER THAN 101.0 F  *CHILLS WITH OR WITHOUT FEVER  NAUSEA AND VOMITING THAT IS NOT CONTROLLED WITH YOUR NAUSEA MEDICATION  *UNUSUAL SHORTNESS OF BREATH  *UNUSUAL BRUISING OR BLEEDING  TENDERNESS IN MOUTH AND THROAT WITH OR WITHOUT PRESENCE OF ULCERS  *URINARY PROBLEMS  *BOWEL PROBLEMS  UNUSUAL RASH Items with * indicate a potential emergency and should be followed up as soon as possible. If you have an emergency after office hours please contact your primary care physician or go to the nearest emergency department.  Please call the clinic during office hours if you have any questions or concerns.   You may also contact the Patient Navigator at [phone] should you have any questions or need assistance in obtaining follow up care.      Resources For Cancer Patients and their Caregivers ? American Cancer Society: Can assist with transportation, wigs, general needs, runs Look Good Feel Better.        918-189-0708 ? Cancer Care: Provides financial assistance, online support groups, medication/co-pay assistance.  1-800-813-HOPE 608 252 0624) ? [place] Assists [place] cancer patients and their families through emotional , educational and financial support.   (902) 054-1765 ? [place] DSS Where to apply for food stamps, Medicaid and utility assistance. 470 449 8691 ? RCATS: Transportation to medical appointments. 641-320-7144 ? Social Security Administration: May apply for disability if have a Stage IV cancer. 409-699-7878 806-187-0502 ? [employer], Disability and Transit Services: Assists with nutrition, care and transit needs. [phone]

## 2015-09-06 NOTE — Patient Instructions (Signed)
[place] at [facility] Discharge Instructions  RECOMMENDATIONS MADE BY THE CONSULTANT AND ANY TEST RESULTS WILL BE SENT TO YOUR REFERRING PHYSICIAN.  Exam done and seen today by Dr. Gustavus Bryant today with treatment. We will discontinue oxaliplatin Return to see the doctor in 4 weeks with labs Please call the clinic if you have any questions or concerns  Thank you for choosing [place] at [facility] to provide your oncology and hematology care.  To afford each patient quality time with our provider, please arrive at least 15 minutes before your scheduled appointment time.   Beginning January 23rd 2017 lab work for the [facility] will be done in the  Main lab at [facility] on 1st floor. If you have a lab appointment with the [place] please come in thru the  Main Entrance and check in at the main information desk  You need to re-schedule your appointment should you arrive 10 or more minutes late.  We strive to give you quality time with our providers, and arriving late affects you and other patients whose appointments are after yours.  Also, if you no show three or more times for appointments you may be dismissed from the clinic at the providers discretion.     Again, thank you for choosing [facility].  Our hope is that these requests will decrease the amount of time that you wait before being seen by our physicians.       _____________________________________________________________  Should you have questions after your visit to [facility], please contact our office at [phone] between the hours of 8:30 a.m. and 4:30 p.m.  Voicemails left after 4:30 p.m. will not be returned until the following business day.  For prescription refill requests, have your pharmacy contact our office.         Resources For Cancer Patients and their Caregivers ? American Cancer Society: Can assist with  transportation, wigs, general needs, runs Look Good Feel Better.        [phone] ? Cancer Care: Provides financial assistance, online support groups, medication/co-pay assistance.  1-800-813-HOPE 253-236-9784) ? [place] Assists [place] cancer patients and their families through emotional , educational and financial support.  903-797-6454 ? [place] DSS Where to apply for food stamps, Medicaid and utility assistance. (636) 416-8393 ? RCATS: Transportation to medical appointments. 224-444-3305 ? Social Security Administration: May apply for disability if have a Stage IV cancer. 206-280-3628 2196555707 ? [employer], Disability and Transit Services: Assists with nutrition, care and transit needs. [place] Support Programs: @10RELATIVEDAYS @ > Cancer Support Group  2nd Tuesday of the month 1pm-2pm, Journey Room  > Creative Journey  3rd Tuesday of the month 1130am-1pm, Journey Room  > Look Good Feel Better  1st Wednesday of the month 10am-12 noon, Journey Room (Call [place] to register [phone])

## 2015-09-06 NOTE — Progress Notes (Signed)
[facility] NOTE  Patient Care Team: [name], PA-C as PCP - General (Physician Assistant) Danie Binder, MD as Consulting Physician (Gastroenterology)  CHIEF COMPLAINTS:  [phone] adenocarcinoma of the terminal ileum, Stage III Peri-neural and LVI noted, 4/19 nodes involved Colonoscopy on 01/12/2015 with Dr Oneida Alar showing mass at the splenic flexure EGD on 01/12/2015 with moderate erosive gastritis from ASA use CEA 01/12/2015 at 6.6 ng/ml Extended R hemicolectomy with Dr. Adonis Huguenin on 02/10/2015   HISTORY OF PRESENTING ILLNESS:  Erik [name] 60 y.o. male is here on referral from Dr. Adonis Huguenin for follow-up of stage III CRC.  Erik [name] is unaccompanied. He is here for Cycle #12 FOLFOX.  His feet and hands are better today. On Tuesday or Wednesday, they began to go numb until about Saturday. When he touches his fingers together, "They feel different though not completely numb". He is able to button shirts. He remarks however that he does notice a difference in the tips of his fingers and toes that has not been present before.   His side effects are about the same. He states they may have stayed a little longer though they were not any more intense.   He reports mild nausea with no vomiting.   He continues to smoke. He will go about a day or two without smoking, "Then I go crazy". States that quitting cigarettes is the worst thing he has ever had to do.   NO nausea or vomiting. No further diarrhea. If he does have it he notes he does take immodium.    Adenocarcinoma of transverse colon ([place])   01/12/2015 Pathologic Stage    Colon, biopsy, distal transverse - TUBULOVILLOUS ADENOMA WITH HIGH GRADE DYSPLASIA.     01/12/2015 Procedure    Colonoscopy by Dr. Oneida Alar.     01/12/2015 Tumor Marker    CEA: 6.6 (H)      01/18/2015 Imaging    CT abd/pelvis- Apple-core lesion identified in the mid transverse colon without obstruction. No evidence  for lymphadenopathy in the gastrohepatic ligament or omentum.  Stable 8 mm hypo attenuating lesion in the left liver, likely a cyst.     02/10/2015 Initial Diagnosis    Adenocarcinoma of transverse colon ([place])     02/12/2015 Definitive Surgery    [name], Extended right hemicolectomy      02/12/2015 Pathology Results    Mucinous adenocarcinoma with penetration of visceral peritoneum, 4/19 lymph nodes for metasattic disease, negative resection margins, with LVI and perineural invasion     03/30/2015 -  Chemotherapy    FOLFOX     05/25/2015 Treatment Plan Change    5 FU bolus discontinued for cycle #5     06/08/2015 Treatment Plan Change    Treatment deferred x 1 week     06/15/2015 Treatment Plan Change    5FU CI decreased by 10% and Oxaliplatin reduced by 15%       MEDICAL HISTORY:  Past Medical History:  Diagnosis Date  . Abnormal stress echocardiogram   . Adenocarcinoma of transverse colon ([place]) 02/10/2015  . Alcohol abuse    Heavy Use up until 2010  . Anxiety   . Depression   . GERD (gastroesophageal reflux disease)   . Head trauma 2001   closed head injury; coma for 4 weeks  . Hypercholesterolemia   . Hypertension   . PTSD (post-traumatic stress disorder)     SURGICAL HISTORY: Past Surgical History:  Procedure Laterality Date  .  cardiac cath    . COLONOSCOPY  2011   Dr. Oneida Alar: multiple adenomas and hyperplastic polyps  . COLONOSCOPY WITH PROPOFOL N/A 01/12/2015   Procedure: COLONOSCOPY WITH PROPOFOL;  Surgeon: Danie Binder, MD;  [facility];  Service: Endoscopy;  Laterality: N/A;  1030  . CRANIOTOMY  2001  . ESOPHAGOGASTRODUODENOSCOPY (EGD) WITH PROPOFOL N/A 01/12/2015   Procedure: ESOPHAGOGASTRODUODENOSCOPY (EGD) WITH PROPOFOL;  Surgeon: Danie Binder, MD;  [facility];  Service: Endoscopy;  Laterality: N/A;  . head injury surgery    . HERNIA REPAIR Right 2012   Inguinal- Forestine Na  . KIDNEY SURGERY     >30 years ago  .  LAPAROSCOPIC RIGHT HEMI COLECTOMY Left 02/10/2015   Procedure: LAPAROSCOPIC THEN OPEN RIGHT HEMI COLECTOMY;  Surgeon: [name], MD;  Location: [facility];  Service: General;  Laterality: Left;  . PORTACATH PLACEMENT N/A 03/24/2015   Procedure: INSERTION PORT-A-CATH;  Surgeon: Jules Husbands, MD;  Location: [facility];  Service: General;  Laterality: N/A;    SOCIAL HISTORY: Social History   Social History  . Marital status: Divorced    Spouse name: N/A  . Number of children: N/A  . Years of education: N/A   Occupational History  . Full time    Social History Main Topics  . Smoking status: Current Some Day Smoker    Packs/day: 0.10    Types: Cigarettes  . Smokeless tobacco: Never Used  . Alcohol use 0.0 oz/week     Comment: beer occ, history of ETOH abuse in remote past.   . Drug use: No     Comment: marijuana last 2012  . Sexual activity: Yes    Partners: Female   Other Topics Concern  . 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 Pulmonary embolism Mother   . Breast cancer Mother   . Cancer Mother   . Heart disease Father   . Cancer Father 51    Leukemia  . Cancer Paternal Grandfather     Lung  . Ataxia Neg Hx   . Chorea Neg Hx   . Dementia Neg Hx   . Mental retardation Neg Hx   . Migraines Neg Hx   .  Multiple sclerosis Neg Hx   . Neurofibromatosis Neg Hx   . Neuropathy Neg Hx   . Parkinsonism Neg Hx   . Seizures Neg Hx   . Stroke Neg Hx   . Colon cancer Neg Hx    indicated that his mother is deceased. He indicated that his father is deceased. He indicated that the status of his paternal grandfather is unknown. He indicated that the status of his neg hx is unknown.    Mother and father both deceased Mother was 12; died of blood clots in her  legs; pulmonary embolism Had a hip replacement and "a bunch of other stuff;" says it's a long complicated story Says "a woman got power of attorney over his mother and they stopped her physical therapy and put her in a nursing home, and due to her inactivity she got blood clots" Father ws 14, died of heart failure No siblings  ALLERGIES:  has No Known Allergies.  MEDICATIONS:  Current Outpatient Prescriptions  Medication Sig Dispense Refill  . albuterol (PROVENTIL HFA;VENTOLIN HFA) 108 (90 BASE) MCG/ACT inhaler Inhale 2 puffs into the lungs every 6 (six) hours as needed for wheezing or shortness of breath. 1 Inhaler 1  . amitriptyline (ELAVIL) 25 MG tablet Take 1 tablet (25 mg total) by mouth at bedtime. 30 tablet 1  . atorvastatin (LIPITOR) 20 MG tablet Take 1 tablet (20 mg total) by mouth daily. 90 tablet 1  . Cyanocobalamin (B-12 COMPLIANCE INJECTION) 1000 MCG/ML KIT Inject as directed.    Marland Kitchen dexlansoprazole (DEXILANT) 60 MG capsule Take 1 capsule (60 mg total) by mouth daily. 90 capsule 1  . fludrocortisone (FLORINEF) 0.1 MG tablet Take 1 tablet (0.1 mg total) by mouth daily. (Patient not taking: Reported on 07/29/2015) 30 tablet 3  . fluorouracil CALGB 16109 in sodium chloride 0.9 % 150 mL Inject into the vein. Reported on 03/22/2015    . FLUoxetine (PROZAC) 20 MG capsule Take 20 mg by mouth 2 (two) times daily. Reported on 07/29/2015    . HYDROcodone-acetaminophen (NORCO) 5-325 MG tablet Take 1 tablet by mouth every 6 (six) hours as needed for  moderate pain. 60 tablet 0  . leucovorin in dextrose 5 % 250 mL Inject into the vein every 14 (fourteen) days. Reported on 03/22/2015    . lidocaine-prilocaine (EMLA) cream Apply a quarter size amount to port site 1 hour prior to chemo. Do not rub in. Cover with plastic wrap. 30 g 3  . loperamide (IMODIUM) 2 MG capsule Take 2 mg by mouth. Every 4-6 hours as needed    . ondansetron (ZOFRAN) 8 MG tablet Take 1 tablet (8 mg total) by mouth every 8 (eight) hours as needed for nausea or vomiting. 30 tablet 0  . OXALIPLATIN IV Inject into the vein every 14 (fourteen) days. Reported on 03/22/2015    . potassium chloride SA (K-DUR,KLOR-CON) 20 MEQ tablet Take 2 tablets (40 mEq total) by mouth daily. 60 tablet 2  . prochlorperazine (COMPAZINE) 10 MG tablet Take 1 tablet (10 mg total) by mouth every 6 (six) hours as needed for nausea or vomiting. 30 tablet 0  . VIAGRA 100 MG tablet TAKE 1/2 TO 1 TABLET BY MOUTH 1 HOUR PRIOR TO INTERCOURSE. NOT TO EXCEED 1 TABLET EVERY 24 HOURS. **EVERY 10 TABLETS IS A 90 DAY SUPPLY** (Patient not taking: Reported on 07/29/2015) 10 tablet 1   No current facility-administered medications for this visit.    Facility-Administered Medications Ordered in Other Visits  Medication Dose Route Frequency Provider Last Rate Last Dose  . sodium chloride flush (NS) 0.9 % injection 10 mL  10 mL Intracatheter PRN Patrici Ranks, MD   10 mL at 08/23/15 1030    Review of Systems  Constitutional: Positive for fatigue. Negative for fever, chills, weight loss and malaise/fatigue.     Walks with cane due to traumatic head injury in the past. Gets tired.  HENT: Negative.  Negative for congestion, hearing loss, nosebleeds, sore throat and tinnitus.   Eyes: Negative.  Negative for blurred vision, double vision, pain and discharge.  Respiratory: Negative.  Negative for cough, hemoptysis, sputum production, shortness of breath and wheezing.   Cardiovascular: Negative.  Negative for chest pain,  palpitations, claudication, leg swelling and PND.  Gastrointestinal: Negative for heartburn, nausea, vomiting, abdominal pain, constipation, blood in stool and melena.  Diarrhea since surgery, unchanged. Mild nausea with chemotherapy Genitourinary: Negative.  Negative for dysuria, urgency, frequency and hematuria.  Musculoskeletal: Positive for falls. Negative for myalgias and joint pain.       Due to traumatic head injury in the past.  Skin: Negative.  Negative for itching and rash.  Neurological: Positive for tingling.  Negative for dizziness, tremors, sensory change, speech change, focal weakness, seizures, loss of consciousness, weakness and headaches.       Traumatic head injury in the past. Neuropathy associated with chemotherapy  Endo/Heme/Allergies: Negative.  Does not bruise/bleed easily.  Psychiatric/Behavioral: Negative.  Negative for depression, suicidal ideas, memory loss and substance abuse. The patient is not nervous/anxious and does not have insomnia.   All other systems reviewed and are negative.  14 point ROS was done and is otherwise as detailed above or in HPI   PHYSICAL EXAMINATION: ECOG PERFORMANCE STATUS: 0 - Asymptomatic  Vitals with BMI 09/06/2015  Height   Weight 217 lbs  BMI   Systolic 409  Diastolic 80  Pulse 81  Respirations 16    Physical Exam  Constitutional: He is oriented to person, place, and time and well-developed, well-nourished, and in no distress.  HENT:  Head: Normocephalic and atraumatic.  Nose: Nose normal.  Mouth/Throat: Oropharynx is clear and moist. No oropharyngeal exudate.  Eyes: Conjunctivae and EOM are normal. Pupils are equal, round, and reactive to light. Right eye exhibits no discharge. Left eye exhibits no discharge. No scleral icterus.  Neck: Normal range of motion. Neck supple. No tracheal deviation present. No thyromegaly present.  Cardiovascular: Normal rate, regular rhythm and normal heart sounds.  Exam reveals no gallop  and no friction rub.   No murmur heard. Pulmonary/Chest: Effort normal and breath sounds normal. He has no wheezes. He has no rales. Port site C/D/I Abdominal: Soft. Bowel sounds are normal. He exhibits no distension and no mass. There is no tenderness. There is no rebound and no guarding.  Musculoskeletal: Normal range of motion. He exhibits no edema.  Lymphadenopathy:    He has no cervical adenopathy.  Neurological: He is alert and oriented to person, place, and time. No cranial nerve deficit. Coordination normal. (cane) Skin: Skin is warm and dry. No rash noted.  Psychiatric: Mood, memory, affect and judgment normal.  Nursing note and vitals reviewed.   LABORATORY DATA:  I have reviewed the data as listed Lab Results  Component Value Date   WBC 6.6 08/23/2015   HGB 13.3 08/23/2015   HCT 39.9 08/23/2015   MCV 97.6 08/23/2015   PLT 118 (L) 08/23/2015   CMP     Component Value Date/Time   NA 137 08/23/2015 0921   K 3.8 08/23/2015 0921   CL 102 08/23/2015 0921   CO2 28 08/23/2015 0921   GLUCOSE 118 (H) 08/23/2015 0921   BUN 9 08/23/2015 0921   CREATININE 0.80 08/23/2015 0921   CREATININE 0.86 12/16/2014 0844   CALCIUM 9.1 08/23/2015 0921   PROT 7.0 08/23/2015 0921   ALBUMIN 3.6 08/23/2015 0921   AST 20 08/23/2015 0921   ALT 12 (L) 08/23/2015 0921   ALKPHOS 72 08/23/2015 0921   BILITOT 0.2 (L) 08/23/2015 0921   GFRNONAA >60 08/23/2015 8119  GFRNONAA >89 12/16/2014 0844   GFRAA >60 08/23/2015 0921   GFRAA >89 12/16/2014 0844     PATHOLOGY Surgical Pathology  CASE: [id number]  PATIENT: Wynona Neat  Surgical Pathology Report   SPECIMEN SUBMITTED:  A. Peritoneal biopsy  B. Extended hemicolectomy, right   CLINICAL HISTORY:  Potential metastatic colon cancer; Tubular adenoma of colon   PRE-OPERATIVE DIAGNOSIS:    POST-OPERATIVE DIAGNOSIS:    DIAGNOSIS:  A. PERITONEUM; BIOPSY:  - FIBROADIPOSE TISSUE WITH INFLAMMATION.   B. RIGHT COLON; EXTENDED  RIGHT HEMICOLECTOMY:  - MUCINOUS ADENOCARCINOMA WITH PENETRATION OF THE VISCERAL PERITONEUM.  - SEPARATE TUBULAR ADENOMA OF THE CECUM.  - APPENDIX WITHIN NORMAL LIMITS.  - FOUR OF NINETEEN LYMPH NODES INVOLVED BY METASTATIC CARCINOMA (4/19).  - THE MARGINS OF RESECTION ARE NEGATIVE.  - SEE SUMMARY BELOW.   Note:  Ample material is available for MSI and other ancillary testing.    COLON AND RECTUM: Resection, Including Transanal Disk Excision of Rectal  Neoplasms  Specimens InvolvedB: Extended hemicolectomy, right   Colon and Rectum, Resection Cancer Case Summary  SPECIMEN  Specimen: Terminal ileum  Cecum  Appendix  Ascending colon  Procedure:   Right hemicolectomy  Primary Tumor Site: Right (ascending) colon  Additional Sites Involved by Tumor:   None identified  Macroscopic Tumor Perforation:   Cannot be determined  unspecified  TUMOR  Histologic Type:  Mucinous adenocarcinoma  Histologic Grade:  Other (specify)  Defer to optional MSI testing for grade assignment: Mucinous  adenocarcinoma is no longer considered definitionally high grade. MSI-H  carcinomas are low grade while MSS or MSI-L carcinomas are high grade  EXTENT  Tumor Size:  Greatest dimension (cm)  4.8cm  Microscopic Tumor Extension: Tumor penetrates to the surface of the  visceral peritoneum (serosa)  MARGINS  Proximal Margin:  Uninvolved by invasive carcinoma  - No adenoma or intraepithelial neoplasia / dysplasia identified  Distal Margin: Uninvolved by invasive carcinoma  - No adenoma or intraepithelial neoplasia / dysplasia identified  Circumferential (Radial) or Mesenteric Margin:  Uninvolved by invasive  carcinoma  Deep Margin:  n/a  ACCESSORY FINDINGS  Lymph-Vascular Invasion: Present  Perineural Invasion:   Present  Tumor Deposits:   Not identified  not identified  STAGE (pTNM)  TNM Descriptors:  n/a  Primary Tumor (pT): pT4a: Tumor penetrates the visceral peritoneum    Regional Lymph Nodes (pN)  pN2a: Metastasis in 4 to 6 regional lymph nodes  Number of Lymph Nodes Examined:  Specify  19  Number of Lymph Nodes Involved:  Specify  4  Distant Metastasis (pM): Not applicable    GROSS DESCRIPTION:  A. Intra Operative Consultation:    Received: Fresh    Specimen: Peritoneal biopsy    Pathologic Evaluation: Frozen section    Diagnosis: Frozen section A1 Peritoneal Biopsy: Grossly fatty  specimen. Fibrosis and inflammation. No carcinoma seen.    Communicated to: Dr. Adonis Huguenin at 2:33 PM on 02/10/2015 by Dr. Luana Shu    Tissue submitted: A1   Labeled: Peritoneal biopsy   Tissue fragment(s): 1   Size: 1.5 x 0.6 x 0.3 cm   Description: Piece of red yellow adipose tissue, sectioned   Entirely submitted in one cassette(s).    B. Labeled: Extended right hemicolectomy  Type of procedure: Right hemicolectomy  Measurement: Terminal ileum 7 cm in length by 5.5 cm in circumference,  appendix 5 cm in length and from 0.5-0.7 cm in diameter, cecum and right  colon measure 33.5 cm in length and range from 7-12.5  cm in  circumference.  Orientation: Serosa inked blue, radial fat margin inked black.  Number of mass(es): 1  Location of mass(es): Ascending colon  Size(s) of mass(es): 4.8 x 4.5 x 1.2 cm  Description of mass(es): Pink red raised with rolled edges firm nodular  mass.  Bowel circumference at site of mass(es): 7.5 cm  Gross depth of invasion: Lesion appears to grossly penetrate through the  wall into the pericolonic fat and is grossly visualized on the serosa.  Macroscopic perforation: Yes  Margins:    Proximal: 26.8 cm  Distal: 5 cm  Circumferential (Radial): 6.5 cm  Mesenteric: Less than 0.1 cm   Luminal obstruction: At least 75% obstruction  Other remarkable findings: Ileocecal valve is somewhat lipomatous. There  is a small raised pink-red polyp located 4.5 cm away from the ileocecal  valve pouch, appears to be confined  to the mucosa.  Lymph nodes: Dissection of the pericolonic fat reveals 18 pink red to  dark brown lymph node candidates ranging from 0.3 cm in diameter up to  1.6 x 0.9 x 0.8 cm, some of which are grossly suspicious for tumor  involvement.  Block summary:  1-terminal ileal margin  2-distal colonic margin  3-radial fat margin  4-7-sections of mass including inked serosa and underlying pericolonic  fat  8-ileocecal valve  9-sections of appendix  10-polyp in cecal pouch  11-12- 5 lymph node candidates per cassette  13- 4 lymph nodes candidates  14-17 1 serially sectioned lymph node per cassette    Final Diagnosis performed by Delorse Lek, MD. Electronically signed  02/12/2015 2:17:44PM   The electronic signature indicates that the named Attending Pathologist  has evaluated the specimen   Technical component performed at [facility], [address] [place],  [place] [address]  Lab: [phone] Dir: Darrick Penna. Evette Doffing, MD   Professional component performed at [facility], [facility],  [place], [place],  [address]  Lab: [phone] Dir: Dellia Nims. Reuel Derby, MD    RADIOGRAPHIC STUDIES: I have personally reviewed the radiological images as listed and agreed with the findings in the report.  03/15/2015 Study Result     CLINICAL DATA: Transverse colon adenocarcinoma status post right hemicolectomy on 02/10/2015, presenting for chest staging.  EXAM: CT CHEST WITH CONTRAST  TECHNIQUE: Multidetector CT imaging of the chest was performed during intravenous contrast administration.  CONTRAST: 82m OMNIPAQUE IOHEXOL 300 MG/ML SOLN  COMPARISON: 02/17/2010 chest CT. 02/13/2015 chest radiograph.  FINDINGS: Mediastinum/Nodes: Normal heart size. No pericardial fluid/thickening. Left anterior descending coronary atherosclerosis. Great vessels are normal in course and caliber. No central pulmonary emboli. Normal visualized thyroid. Normal esophagus.  No pathologically enlarged axillary, mediastinal or hilar lymph nodes.  Lungs/Pleura: No pneumothorax. No pleural effusion. Mild centrilobular emphysema and diffuse bronchial wall thickening. There is a subsolid 1.1 x 1.1 cm left upper lobe pulmonary nodule (series 3/ image 30), new since 02/17/2010. There is patchy centrilobular nodularity throughout both lungs, upper lobe predominant, not appreciably changed since 02/17/2010. No acute consolidative airspace disease or additional significant pulmonary nodules.  Upper abdomen: Unremarkable.  Musculoskeletal: No aggressive appearing focal osseous lesions. Mild degenerative changes are seen in the thoracic spine.  IMPRESSION: 1. Subsolid 1.1 cm left upper lobe pulmonary nodule, new since 02/17/2010, indeterminate (favor either a focal irregular scar or primary lung neoplasm, unlikely to represent a metastasis). Initial follow-up by chest CT without contrast is recommended in 3 months to confirm persistence. This recommendation follows the consensus statement: Recommendations for the Management of  Subsolid Pulmonary Nodules Detected at CT: A Statement from the [place] as published in Radiology 2013; 266:304-317. 2. Patchy upper lobe predominant centrilobular nodularity throughout both lungs, not appreciably changed since 02/17/2010, likely representing smoking related interstitial lung disease (respiratory bronchiolitis if the patient is asymptomatic or respiratory bronchiolitis - interstitial lung disease (RB-ILD) if the patient is symptomatic). 3. Mild centrilobular emphysema and diffuse bronchial wall thickening, suggesting COPD. 4. Coronary atherosclerosis.   Electronically Signed  By: Ilona Sorrel M.D.  On: 03/15/2015 09:38     ASSESSMENT & PLAN:  [id number] adenocarcinoma of the terminal ileum, Stage III Peri-neural and LVI noted, 4/19 nodes involved Colonoscopy on 01/12/2015 with Dr Oneida Alar showing  mass at the splenic flexure EGD on 01/12/2015 with moderate erosive gastritis from ASA use CEA 01/12/2015 at 6.6 ng/ml Extended R hemicolectomy with Dr. Adonis Huguenin on 02/10/2015 1.1 cm LUL pulmonary nodule B12 deficiency 149 pg/ml   Adenocarcinoma of transverse colon ([place]) Stage IIIC ([id number]) adenocarcinoma of transverse colon, S/P definitive surgery with a right extended hemicolectomy by Dr. [name].  Now on systemic adjuvant therapy consisting of FOLFOX. Today is cycle #12, I have discontinued oxaliplatin because of neuropathy. I am concerned that treating him today will leave him with permanent neuropathy. He has completed 11 cycles.   Oncology history is up to date.  RTC in 3 to 4 weeks. Refer to survivorship clinic. Follow up with his microsatellite testing  B12 deficiency Continue B12 monthly for now. Will eventually try OTC oral B12 with close observation in future.   H/O Depression  He continues to follow with his therapist. He has met with Abby Potash here as well.   Hypokalemia  Continue oral potassium for now.   All questions were answered. The patient knows to call the clinic with any problems, questions or concerns.  This document serves as a record of services personally performed by Ancil Linsey, MD. It was created on her behalf by Arlyce Harman, a trained medical scribe. The creation of this record is based on the scribe's personal observations and the provider's statements to them. This document has been checked and approved by the attending provider.  I have reviewed the above documentation for accuracy and completeness and I agree with the above.  This note was electronically signed.  Molli Hazard, MD  09/06/2015 9:27 AM

## 2015-09-06 NOTE — Progress Notes (Signed)
Patient tolerated infusion well.  VSS.   

## 2015-09-08 ENCOUNTER — Encounter (HOSPITAL_COMMUNITY): Payer: Medicaid Other | Attending: Hematology & Oncology

## 2015-09-08 VITALS — BP 138/76 | HR 86 | Temp 98.5°F | Resp 18

## 2015-09-08 DIAGNOSIS — Z452 Encounter for adjustment and management of vascular access device: Secondary | ICD-10-CM | POA: Diagnosis present

## 2015-09-08 DIAGNOSIS — C184 Malignant neoplasm of transverse colon: Secondary | ICD-10-CM | POA: Insufficient documentation

## 2015-09-08 MED ORDER — HEPARIN SOD (PORK) LOCK FLUSH 100 UNIT/ML IV SOLN
500.0000 [IU] | Freq: Once | INTRAVENOUS | Status: AC | PRN
  Administered 2015-09-08: 500 [IU]

## 2015-09-08 MED ORDER — SODIUM CHLORIDE 0.9% FLUSH
10.0000 mL | INTRAVENOUS | Status: DC | PRN
  Administered 2015-09-08: 10 mL
  Filled 2015-09-08: qty 10

## 2015-09-08 NOTE — Patient Instructions (Signed)
[place] at [facility] Discharge Instructions  RECOMMENDATIONS MADE BY THE CONSULTANT AND ANY TEST RESULTS WILL BE SENT TO YOUR REFERRING PHYSICIAN.  Pump removal.  LAST treatment!!  Thank you for choosing [place] at [facility] to provide your oncology and hematology care.  To afford each patient quality time with our provider, please arrive at least 15 minutes before your scheduled appointment time.   Beginning January 23rd 2017 lab work for the [facility] will be done in the  Main lab at [facility] on 1st floor. If you have a lab appointment with the [facility] please come in thru the  Main Entrance and check in at the main information desk  You need to re-schedule your appointment should you arrive 10 or more minutes late.  We strive to give you quality time with our providers, and arriving late affects you and other patients whose appointments are after yours.  Also, if you no show three or more times for appointments you may be dismissed from the clinic at the providers discretion.     Again, thank you for choosing [facility].  Our hope is that these requests will decrease the amount of time that you wait before being seen by our physicians.       _____________________________________________________________  Should you have questions after your visit to [facility], please contact our office at [phone] between the hours of 8:30 a.m. and 4:30 p.m.  Voicemails left after 4:30 p.m. will not be returned until the following business day.  For prescription refill requests, have your pharmacy contact our office.         Resources For Cancer Patients and their Caregivers ? American Cancer Society: Can assist with transportation, wigs, general needs, runs Look Good Feel Better.        [phone] ? Cancer Care: Provides financial assistance, online support groups, medication/co-pay assistance.   1-800-813-HOPE 802-707-2175) ? [place] Assists [place] cancer patients and their families through emotional , educational and financial support.  (224) 347-9573 ? [place] DSS Where to apply for food stamps, Medicaid and utility assistance. 7752736250 ? [employer]: Transportation to medical appointments. (630)233-0940 ? Social Security Administration: May apply for disability if have a Stage IV cancer. ([phone] 773-9301 3863327671 ? [employer], Disability and Transit Services: Assists with nutrition, care and transit needs. [place] Support Programs: @10RELATIVEDAYS @ > Cancer Support Group  2nd Tuesday of the month 1pm-2pm, Journey Room  > Creative Journey  3rd Tuesday of the month 1130am-1pm, Journey Room  > Look Good Feel Better  1st Wednesday of the month 10am-12 noon, Journey Room (Call [place] to register [phone])

## 2015-09-08 NOTE — Progress Notes (Signed)
Patient arrives for pump removal and port flush and de-access.  Pump infused without any issue.  Removed and port flushed with 17ml normal saline and 521ml heparin per protocol.  VSS.  No distress or issues voiced.  Patient understands his schedule and follow up care moving forward.

## 2015-09-09 ENCOUNTER — Other Ambulatory Visit (HOSPITAL_COMMUNITY): Payer: Self-pay

## 2015-09-09 ENCOUNTER — Encounter (HOSPITAL_COMMUNITY): Payer: Self-pay

## 2015-09-09 ENCOUNTER — Other Ambulatory Visit (HOSPITAL_COMMUNITY): Payer: Self-pay | Admitting: Oncology

## 2015-09-09 DIAGNOSIS — C184 Malignant neoplasm of transverse colon: Secondary | ICD-10-CM

## 2015-09-09 MED ORDER — LOPERAMIDE HCL 2 MG PO CAPS: 2.0000 mg | ORAL_CAPSULE | Freq: Four times a day (QID) | ORAL | 0 refills | Status: DC | PRN

## 2015-09-14 ENCOUNTER — Ambulatory Visit: Payer: Self-pay | Admitting: Physician Assistant

## 2015-09-20 ENCOUNTER — Ambulatory Visit: Payer: Self-pay | Admitting: Physician Assistant

## 2015-09-20 ENCOUNTER — Encounter: Payer: Self-pay | Admitting: Physician Assistant

## 2015-09-20 VITALS — BP 134/70 | HR 86 | Temp 97.7°F | Ht 74.0 in | Wt 217.0 lb

## 2015-09-20 DIAGNOSIS — C184 Malignant neoplasm of transverse colon: Secondary | ICD-10-CM

## 2015-09-20 DIAGNOSIS — F329 Major depressive disorder, single episode, unspecified: Secondary | ICD-10-CM

## 2015-09-20 DIAGNOSIS — J449 Chronic obstructive pulmonary disease, unspecified: Secondary | ICD-10-CM

## 2015-09-20 DIAGNOSIS — E785 Hyperlipidemia, unspecified: Secondary | ICD-10-CM

## 2015-09-20 DIAGNOSIS — F17219 Nicotine dependence, cigarettes, with unspecified nicotine-induced disorders: Secondary | ICD-10-CM

## 2015-09-20 DIAGNOSIS — F32A Depression, unspecified: Secondary | ICD-10-CM

## 2015-09-20 NOTE — Progress Notes (Signed)
BP 134/70   Pulse 86   Temp 97.7 F (36.5 C)   Ht _0  (1.88 m)   Wt 217 lb (98.4 kg)   SpO2 97%   BMI 27.86 kg/m    Subjective:    Patient ID: Erik [name], male    DOB: 04/06/1955, 60 y.o.   MRN: [id number]  HPI: KAYDIN KARBOWSKI is a 60 y.o. male presenting on 09/20/2015 for Hyperlipidemia   HPI   Pt states it is helpful having [facility] for his medications as he finds it difficult to keep up with his medicines.  Pt is still going to Dr Whitney Muse for colon cancer. He has f/u there 8/29. Pt has appointment tomorrow with Elzie Rings that he doesn't know what it's for.  Reviewed Dr Gladys Damme note- perhaps it is appointment at survivorship clinic.   Pt is still smoking "every now and then".  He will occasionally have etoh.   He is still going to [facility].  reviewed recent labs- pt did not get his lipids drawn   Pt saw cards/dr Harl Bowie in April.  He says he saw Jory Sims after that but I can't locate office note in Epic.  He says he is returning to neurology in November   Relevant past medical, surgical, family and social history reviewed and updated as indicated. Interim medical history since our last visit reviewed. Allergies and medications reviewed and updated.   Current Outpatient Prescriptions:  .  albuterol (PROVENTIL HFA;VENTOLIN HFA) 108 (90 BASE) MCG/ACT inhaler, Inhale 2 puffs into the lungs every 6 (six) hours as needed for wheezing or shortness of breath., Disp: 1 Inhaler, Rfl: 1 .  amitriptyline (ELAVIL) 25 MG tablet, Take 1 tablet (25 mg total) by mouth at bedtime. (Patient taking differently: Take 25 mg by mouth at bedtime as needed. ), Disp: 30 tablet, Rfl: 1 .  atorvastatin (LIPITOR) 20 MG tablet, Take 1 tablet (20 mg total) by mouth daily., Disp: 90 tablet, Rfl: 1 .  dexlansoprazole (DEXILANT) 60 MG capsule, Take 1 capsule (60 mg total) by mouth daily., Disp: 90 capsule, Rfl: 1 .  FLUoxetine (PROZAC) 20 MG capsule, Take 20 mg by  mouth 2 (two) times daily. Reported on 07/29/2015, Disp: , Rfl:  .  HYDROcodone-acetaminophen (NORCO) 5-325 MG tablet, Take 1 tablet by mouth every 6 (six) hours as needed for moderate pain., Disp: 60 tablet, Rfl: 0 .  loperamide (IMODIUM) 2 MG capsule, Take 1-2 capsules (2-4 mg total) by mouth every 6 (six) hours as needed for diarrhea or loose stools., Disp: 60 capsule, Rfl: 0 .  Cyanocobalamin (B-12 COMPLIANCE INJECTION) 1000 MCG/ML KIT, Inject as directed., Disp: , Rfl:  .  fludrocortisone (FLORINEF) 0.1 MG tablet, Take 1 tablet (0.1 mg total) by mouth daily. (Patient not taking: Reported on 07/29/2015), Disp: 30 tablet, Rfl: 3 .  fluorouracil CALGB 40981 in sodium chloride 0.9 % 150 mL, Inject into the vein. Reported on 03/22/2015, Disp: , Rfl:  .  leucovorin in dextrose 5 % 250 mL, Inject into the vein every 14 (fourteen) days. Reported on 03/22/2015, Disp: , Rfl:  .  lidocaine-prilocaine (EMLA) cream, Apply a quarter size amount to port site 1 hour prior to chemo. Do not rub in. Cover with plastic wrap. (Patient not taking: Reported on 09/20/2015), Disp: 30 g, Rfl: 3 .  ondansetron (ZOFRAN) 8 MG tablet, TAKE 1 TABLET BY MOUTH EVERY 8 HOURS AS NEEDED FOR NAUSEA OR VOMITING (Patient not taking: Reported on 09/20/2015), Disp: 30 tablet,  Rfl: 1 .  OXALIPLATIN IV, Inject into the vein every 14 (fourteen) days. Reported on 03/22/2015, Disp: , Rfl:  .  potassium chloride SA (K-DUR,KLOR-CON) 20 MEQ tablet, Take 2 tablets (40 mEq total) by mouth daily. (Patient not taking: Reported on 09/20/2015), Disp: 60 tablet, Rfl: 2 .  prochlorperazine (COMPAZINE) 10 MG tablet, Take 1 tablet (10 mg total) by mouth every 6 (six) hours as needed for nausea or vomiting. (Patient not taking: Reported on 09/20/2015), Disp: 30 tablet, Rfl: 0 .  VIAGRA 100 MG tablet, TAKE 1/2 TO 1 TABLET BY MOUTH 1 HOUR PRIOR TO INTERCOURSE. NOT TO EXCEED 1 TABLET EVERY 24 HOURS. **EVERY 10 TABLETS IS A 90 DAY SUPPLY** (Patient not taking: Reported  on 07/29/2015), Disp: 10 tablet, Rfl: 1 No current facility-administered medications for this visit.   Facility-Administered Medications Ordered in Other Visits:  .  sodium chloride flush (NS) 0.9 % injection 10 mL, 10 mL, Intracatheter, PRN, Patrici Ranks, MD, 10 mL at 08/23/15 1030  Review of Systems  Constitutional: Positive for appetite change and fatigue. Negative for chills, diaphoresis, fever and unexpected weight change.  HENT: Negative for congestion, dental problem, drooling, ear pain, facial swelling, hearing loss, mouth sores, sneezing, sore throat, trouble swallowing and voice change.   Eyes: Negative for pain, discharge, redness and itching.  Respiratory: Positive for shortness of breath. Negative for cough, choking and wheezing.   Cardiovascular: Negative for chest pain, palpitations and leg swelling.  Gastrointestinal: Positive for abdominal pain and diarrhea. Negative for blood in stool, constipation and vomiting.  Endocrine: Positive for cold intolerance and heat intolerance. Negative for polydipsia.  Genitourinary: Negative for decreased urine volume, dysuria and hematuria.  Musculoskeletal: Positive for gait problem. Negative for arthralgias and back pain.  Skin: Negative for rash.  Allergic/Immunologic: Negative for environmental allergies.  Neurological: Positive for light-headedness. Negative for seizures and headaches.  Hematological: Negative for adenopathy.  Psychiatric/Behavioral: Positive for dysphoric mood. Negative for agitation and suicidal ideas. The patient is nervous/anxious.     Per HPI unless specifically indicated above     Objective:    BP 134/70   Pulse 86   Temp 97.7 F (36.5 C)   Ht _0  (1.88 m)   Wt 217 lb (98.4 kg)   SpO2 97%   BMI 27.86 kg/m   Wt Readings from Last 3 Encounters:  09/20/15 217 lb (98.4 kg)  09/06/15 217 lb (98.4 kg)  08/23/15 219 lb 9.6 oz (99.6 kg)    Physical Exam  Constitutional: He is oriented to person,  place, and time. He appears well-developed and well-nourished.  HENT:  Head: Normocephalic and atraumatic.  Neck: Neck supple.  Cardiovascular: Normal rate and regular rhythm.   Pulmonary/Chest: Effort normal and breath sounds normal. He has no wheezes.  Abdominal: Soft. Bowel sounds are normal. There is no hepatosplenomegaly. There is no tenderness.  Musculoskeletal: He exhibits no edema.  Lymphadenopathy:    He has no cervical adenopathy.  Neurological: He is alert and oriented to person, place, and time.  Skin: Skin is warm and dry.  Psychiatric: He has a normal mood and affect. His behavior is normal.  Vitals reviewed.   Results for orders placed or performed in visit on 09/06/15  Comprehensive metabolic panel  Result Value Ref Range   Sodium 136 135 - 145 mmol/L   Potassium 3.9 3.5 - 5.1 mmol/L   Chloride 102 101 - 111 mmol/L   CO2 28 22 - 32 mmol/L   Glucose, Bld  106 (H) 65 - 99 mg/dL   BUN 8 6 - 20 mg/dL   Creatinine, Ser 0.75 0.61 - 1.24 mg/dL   Calcium 9.0 8.9 - 10.3 mg/dL   Total Protein 7.3 6.5 - 8.1 g/dL   Albumin 3.8 3.5 - 5.0 g/dL   AST 21 15 - 41 U/L   ALT 17 17 - 63 U/L   Alkaline Phosphatase 75 38 - 126 U/L   Total Bilirubin 0.6 0.3 - 1.2 mg/dL   GFR calc non Af Amer >60 >60 mL/min   GFR calc Af Amer >60 >60 mL/min   Anion gap 6 5 - 15  CBC with Differential  Result Value Ref Range   WBC 6.5 4.0 - 10.5 K/uL   RBC 4.38 4.22 - 5.81 MIL/uL   Hemoglobin 13.9 13.0 - 17.0 g/dL   HCT 41.7 39.0 - 52.0 %   MCV 95.2 78.0 - 100.0 fL   MCH 31.7 26.0 - 34.0 pg   MCHC 33.3 30.0 - 36.0 g/dL   RDW 13.9 11.5 - 15.5 %   Platelets 129 (L) 150 - 400 K/uL   Neutrophils Relative % 66 %   Neutro Abs 4.3 1.7 - 7.7 K/uL   Lymphocytes Relative 18 %   Lymphs Abs 1.2 0.7 - 4.0 K/uL   Monocytes Relative 12 %   Monocytes Absolute 0.8 0.1 - 1.0 K/uL   Eosinophils Relative 3 %   Eosinophils Absolute 0.2 0.0 - 0.7 K/uL   Basophils Relative 1 %   Basophils Absolute 0.0 0.0 -  0.1 K/uL      Assessment & Plan:   Encounter Diagnoses  Name Primary?  . Hyperlipidemia Yes  . Cigarette nicotine dependence with nicotine-induced disorder   . Chronic obstructive pulmonary disease, unspecified COPD type ([place])   . Adenocarcinoma of transverse colon ([place])   . Depression     -pt to get fasting lipids drawn- he is given paper order sheet (not in EPIC as medicaid payment not set up yet for this office) -pt to continue with multiple specialists as discussed above. -counseled to avoid smoking and etoh -f/u 3 months.  RTO sooner prn

## 2015-09-21 ENCOUNTER — Encounter (HOSPITAL_BASED_OUTPATIENT_CLINIC_OR_DEPARTMENT_OTHER): Payer: Medicaid Other | Admitting: Adult Health

## 2015-09-21 ENCOUNTER — Other Ambulatory Visit: Payer: Self-pay | Admitting: Physician Assistant

## 2015-09-21 ENCOUNTER — Encounter: Payer: Self-pay | Admitting: *Deleted

## 2015-09-21 ENCOUNTER — Encounter: Payer: Self-pay | Admitting: Adult Health

## 2015-09-21 DIAGNOSIS — F4322 Adjustment disorder with anxiety: Secondary | ICD-10-CM | POA: Diagnosis not present

## 2015-09-21 DIAGNOSIS — C184 Malignant neoplasm of transverse colon: Secondary | ICD-10-CM

## 2015-09-21 DIAGNOSIS — Z85038 Personal history of other malignant neoplasm of large intestine: Secondary | ICD-10-CM

## 2015-09-21 DIAGNOSIS — G629 Polyneuropathy, unspecified: Secondary | ICD-10-CM

## 2015-09-21 DIAGNOSIS — Z720 Tobacco use: Secondary | ICD-10-CM | POA: Diagnosis not present

## 2015-09-21 DIAGNOSIS — R5383 Other fatigue: Secondary | ICD-10-CM

## 2015-09-21 LAB — LIPID PANEL
CHOL/HDL RATIO: 3.4 ratio (ref <=5.0)
CHOLESTEROL: 178 mg/dL (ref 125–200)
HDL: 52 mg/dL (ref >=40)
LDL CALC: 104 mg/dL (ref <130)
TRIGLYCERIDES: 109 mg/dL (ref <150)
VLDL: 22 mg/dL (ref <30)

## 2015-09-21 NOTE — Progress Notes (Signed)
Colon Cancer Treatment Summary and Survivorship Care Plan Provided by Mike Craze, NP on 09/21/2015   This Survivorship Care Plan summarizes information about your diagnosis, follow-up care, symptoms to watch for, and steps you can take to stay healthy.  The information in this care plan will be important for you to keep so that doctors and other health care providers that you see in the future will have information about your cancer, its treatment, and how best to work with you to monitor your health.  In the future, your healthcare providers may need more details about your cancer and how you were treated.  This Survivorship Care Plan may help you locate information related to your treatment.  Resources for cancer survivors are listed as part of the [place] so that you may obtain additional information and identify support services immediately or in the future.  General Information  Patient Name Erik [name]  Patient ID [id number]  Date of Birth November 15, 1955  Age of Diagnosis 60 y.o.   Your Treatment Team  Surgeon Dr. [name]  Medical Oncologist Dr. Ancil Linsey  Survivorship Nurse Practitioner Mike Craze, NP  Social Worker Loren Racer, [place]   Cancer Diagnosis Information  Type of Cancer Colon cancer  Pathology Adenocarcinoma of the transverse colon  Date of Diagnosis 01/12/15  Stage Stage IIIC (pT4a,pN2a,M0)  CEA (tumor marker) at diagnosis CEA 6.6 (01/18/15)  Most recent CEA CEA 2.6 (03/09/15)      Your cancer treatment included surgery and chemotherapy.       Treatment Summary    SURGERY: Definitive Surgery Date 02/12/15  Colon Surgery Type Right hemicolectomy   Lymph Node Dissection Yes  Number of Lymph Nodes Removed 19  Number of Positive Lymph Nodes 4    Side Effects of Surgery  Possible Long-Term Scars Chronic Pain  Possible Late-Term (five or more years after treatment) Lymphedema or Swelling of the Abdomen or Legs           CHEMOTHERAPY:  Drugs Received Date Started Date Completed # cycles (number of times drug was received) Comments  _0   Fluorouracil (5-FU)  03/30/15  09/06/15 12 Dose-reduced due to side-effects  _1   Leucovorin  03/30/15  09/06/15  12 Not a chemotherapy medication; used with 5-FU  _2   Oxaliplatin  03/30/15  08/23/15 11 Dose-reduced due to side effects, then stopped after 11 cycles due to peripheral neuropathy.   Side Effects of Chemotherapy/Biotherapy  Possible Long-Term Fatigue Neuropathy Cognitive Dysfunction Heart Failure Kidney Failure Infertility  Liver Problems  Possible Late-Term (five or more years after treatment) Cataracts Infertility Liver Problems Lung Disease Osteoporosis Reduced Lung Capacity Secondary Primary Cancer     Surveillance & Follow-up Schedule   *Below is the recommended follow-up schedule for patients with a history of colon cancer.  The time frame is determined since your completion of treatment.     Time Provider Labs Scans Colonoscopy  3 mo Surgery     6 mo Med Onc CEA    9 mo Surgery     1 year Med Onc CEA, labs CT Chest/abd/pelvis yes  15 mo Surgery     18 mo Med Onc CEA    21 mo Surgery     2 years Med Onc CEA, labs CT Chest/abd/pelvis   2 y 6 mo Surgery     3 year Med Onc CEA CT Chest/abd/pelvis   3 y 6 mo Surgery     4 year Med Onc CEA  Yes,  then every 5 years  4 y 6 mo Surgery     5 year Med Onc CEA, labs    5+ years PCP or other   Every 5 years      Preventative Care Recommendations Get regular screenings for all body systems at frequencies indicated  Study/Test and Recommendations Frequency Next Due  Annual Physical . Primary Care Provider . Should include skin examination Annually Due with [name], PA-C   Prostate Exam (Men) . Beginning at age 34 . Talk with your Primary Care Provider about the pros and cons of testing so you can decide if testing is the right choice for you Discuss withShannon  Roslynn Amble, PA-C Discuss with [name], PA-C            WHAT NOW?  Often cancer survivors are fearful about their cancer coming back or being diagnosed with a new cancer.  Below are symptoms that you and your loved ones should watch for and report to your provider.    Symptoms to Watch for and Report to Your Provider  . Return of the cancer symptoms you had before- such as a lump or new growth where your cancer first started . New or unusual pain that seems unrelated to an injury and does not go away, including back pain or bone pain . Weight loss without trying/intending . Unexplained bleeding . A rash or allergic reaction, such as swelling, severe itching or wheezing . Chills or fevers . Persistent headaches . Shortness of breath or difficulty breathing . Bloody Stools or blood in your urine . Lumps, bumps, swelling and/or nipple discharge . Nausea, vomiting, diarrhea, loss of appetite, or trouble swallowing . A cough that doesn't go away . Abdominal pain . Swelling in your arms or legs . Fractures . Hot flashes or other menopausal symptoms . Any other signs mentioned by your doctor or nurse or any unusual symptoms                 that you just can't explain            Just because you have certain symptoms, it doesn't mean the cancer has come back. Symptoms can be due to other problems that need to be addressed.       RESOURCES, INTERVENTIONS, & EDUCATION:  Survivors of cancer often report some of the following needs or concerns after they have completed treatment.  Below are some suggested interventions and resources to help guide you.  Just because your cancer treatment has ended, it does not mean that we stop helping you manage your needs or concerns.  Please let us know how we can best help you in your new life post-cancer and your return to health and wellness!   Other Needs/Concerns and Suggested Intervention(s)  Depression:  Referral for psychiatric care or  counseling and/or initiation of pharmacotherapy.  Weight Gain or Overweight:   Dietary Counseling  Referral to Dietician or Weight Management support group (ie: [employer], [employer], etc.)  General Anxiety: Referral to support groups:  [place] www.cancer.Radonna Ricker 1.800.227.23  [place] local resources: 564-790-5824  Concerns about risk of Recurrence:  Patients need to know that concerns/feelings of recurrent disease are common  Referral to support groups:  [place] www.cancer.org, 1.757-169-3328  Screening for other Cancers:  Cancer diagnosis does not change screening practices for other cancers.  Refer to the Preventative Care section for scheduling/guideline details.  Insomnia: As appropriate:  Instructions on sleep hygiene  Pain evaluation  Hot flash management  Treatment for anxiety  Fatigue:  Regular physical activity- walking 20 minutes daily  Evaluation for hypothyroidism, anemia, depression  Memory problems and/or confusion:  Patients should know that 25% of cancer patients have cognitive dysfunction after treatment and it usually gets better over time  Rule out depression, sleep disturbance  Decreased range of motion:  Referral to Physical Therapy  Joint aches and pains: Symptom management  Refer to Physical therapy   Other persistent pain:  Treatment for pain and/or referral to pain management specialist   Intimacy and sexuality:  Evaluation and treatment for anxiety, depression, as appropriate  Refer to sexual counselor and/or GYN.  Consider a support group to address body image concerns, such as breast asymmetry, prosthesis:  American Cancer Society's Look Good ... Feel Better program (www.cancer.org, 1.(651)770-3333)  Marital/partner/family relationships:  Referral to Social Worker  Genetic Risk:  Given compelling family history of breast cancer:  Referral for genetic testing (BRCA)  Referral  for counseling by a medical oncologist and genetic counselor.  Referral to Gynecologist for ovarian cancer risk-evaluation  Employment, Scientist, product/process development and Finances  Referral to Education officer, museum  Wellness- Diet, Exercise, Smoking Cessation  Maintain a healthy body weight  Regular physical activity (walking 20 minutes daily)  Avoidance of smoking/smoking cessation counseling, if appropriate  Limitation of alcohol intake to less than one drink, two to three times a week     LIVING A LIFE OF WELLNESS AFTER CANCER:  *Note: Please consult your health care provider before using any medications, supplements, over-the-counter products, or other interventions.  Also, please consult your primary care provider before you begin any lifestyle program (diet, exercise, etc.).  Your safety is our top priority and we want to make sure you continue to live a long and healthy life!  Healthy Lifestyle Recommendations  As a cancer survivor, it will be important to maintain a lifelong commitment to a healthy lifestyle. . Maintain a healthy weight . Exercise often per your doctor's orders . [place] balanced diet high in fruits, vegetables, bran and fiber . Limit how much alcohol you consume, if at all . Osteoporosis screening . Cardiovascular disease screening . Stop smoking (if you smoke) . Know yourself, your family history, and your risks . Be mindful of your emotional, social, and spiritual needs . Establish with a Primary Care Provider if you do not already have one . Attend Journey to a Healthy Lifestyle cancer survivorship classes      Thank you so much for allowing [facility] to care for you during your cancer experience.  Our continued commitment is to you and your caregiver(s) health and happiness and again, Congratulations!     Mike Craze, NP Survivorship Program [place] [phone]

## 2015-09-21 NOTE — Progress Notes (Signed)
[place], [place] [address]   CLINIC:  Survivorship  REASON FOR VISIT:  Survivorship Care Plan visit & to address acute survivorship needs with Survivorship NP & Oncology Clinical Social Worker   BRIEF ONCOLOGY HISTORY:    Adenocarcinoma of transverse colon ([place])   01/12/2015 Pathologic Stage    Colon, biopsy, distal transverse - TUBULOVILLOUS ADENOMA WITH HIGH GRADE DYSPLASIA.     01/12/2015 Procedure    Colonoscopy by Dr. Oneida Alar.     01/12/2015 Tumor Marker    CEA: 6.6 (H)      01/18/2015 Imaging    CT abd/pelvis- Apple-core lesion identified in the mid transverse colon without obstruction. No evidence for lymphadenopathy in the gastrohepatic ligament or omentum.  Stable 8 mm hypo attenuating lesion in the left liver, likely a cyst.     02/10/2015 Initial Diagnosis    Adenocarcinoma of transverse colon ([place])     02/12/2015 Definitive Surgery    [name], Extended right hemicolectomy      02/12/2015 Pathology Results    Mucinous adenocarcinoma with penetration of visceral peritoneum, 4/19 lymph nodes for metastatic disease, negative resection margins, with LVI and perineural invasion     03/30/2015 - 09/06/2015 Chemotherapy    FOLFOX x 12 cycles     05/25/2015 Treatment Plan Change    5 FU bolus discontinued for cycle #5     06/08/2015 Treatment Plan Change    Treatment deferred x 1 week     06/15/2015 Treatment Plan Change    5FU CI decreased by 10% and Oxaliplatin reduced by 15% for cycles #6-#11; Oxaliplatin dropped for cycle #12 d/t neuropathy.        INTERVAL HISTORY:  Mr. Dibble reports feeling "pretty well" since completing chemotherapy about 2 weeks ago.  He still has some lingering fatigue and taste changes, which he attributes to the chemo.  He also continues to have some peripheral neuropathy in his hands/feet.  He tells me that he has new buttock/perineal numbness, but only when he's standing; he does not have this  sensation when he is up and walking; this is a new symptom within the past week or so.  He denies any bowel or urinary incontinence. He denies any blood in his stool or urine.  He denies N&V; appetite is improving.  He has some continued diarrhea, but this is improving.  He walks with a cane, due to a previous head injury (chronic).    He is frequently dizzy, "blacks out", and has frequent falls due to his h/o head injury;   He initially did not report any recent falls, but during his physical exam noted that he did fall about 1 week ago.  He was told that he passed out and fell forward into some bushes and then over to his side/back.  He is concerned about his cancer coming back, as well as his finances.  He notes "being a worrier" and states that he has noticed that he spends quite a bit of time thinking about his cancer and worrying about the future of his disease.  "I just worry that if it comes back, it will be Stage IV and I might as well just get ready to die."  I provided reassurance that while Stage IV cancer is not curable, it is treatable and if his has a recurrence of his cancer that is Stage IV at that time, then he would likely have several treatment option discussions with Dr. Whitney Muse.  I also reviewed his cancer stage with him (Stage IIIC), to remind him that he was not Stage IV, but this seems to be one of his biggest fears right now.    He has a good support system in his friends in his community and at his church.  He has a history of PTSD and has noticed that his anxiety is worse now in a lot of ways.  He is worried about an upcoming disability hearing on Friday, 09/24/15.  He has not seen his counselor recently, but has plans to get an appointment with them soon once his financial situation is better.    He continues to smoke cigarettes; he is still working on quitting. "I try to go as long as long as I can without a cigarette; I go until I feel like I'm gonna pull my hair out, then I  have a cigarette."  He is interested in smoking cessation.   *Note: Oncology Clinical Social Worker, Loren Racer, was present during most of today's visit.   ADDITIONAL REVIEW OF SYSTEMS:  Review of Systems  Constitutional: Positive for malaise/fatigue.       (+) fatigue  HENT:       Taste changes  Eyes: Negative.   Respiratory: Negative.   Cardiovascular: Negative.   Gastrointestinal: Positive for diarrhea. Negative for blood in stool, melena, nausea and vomiting.  Genitourinary: Negative for dysuria and hematuria.  Musculoskeletal: Positive for falls and joint pain.  Skin:       Dry skin  Neurological: Positive for dizziness.  Psychiatric/Behavioral: Positive for depression. The patient is nervous/anxious.      PAST MEDICAL & SURGICAL HISTORY:  Past Medical History:  Diagnosis Date  . Abnormal stress echocardiogram   . Adenocarcinoma of transverse colon ([place]) 02/10/2015  . Alcohol abuse    Heavy Use up until 2010  . Anxiety   . Depression   . GERD (gastroesophageal reflux disease)   . Head trauma 2001   closed head injury; coma for 4 weeks  . Hypercholesterolemia   . Hypertension   . PTSD (post-traumatic stress disorder)    Past Surgical History:  Procedure Laterality Date  . cardiac cath    . COLONOSCOPY  2011   Dr. Oneida Alar: multiple adenomas and hyperplastic polyps  . COLONOSCOPY WITH PROPOFOL N/A 01/12/2015   Procedure: COLONOSCOPY WITH PROPOFOL;  Surgeon: Danie Binder, MD;  [facility];  Service: Endoscopy;  Laterality: N/A;  1030  . CRANIOTOMY  2001  . ESOPHAGOGASTRODUODENOSCOPY (EGD) WITH PROPOFOL N/A 01/12/2015   Procedure: ESOPHAGOGASTRODUODENOSCOPY (EGD) WITH PROPOFOL;  Surgeon: Danie Binder, MD;  [facility];  Service: Endoscopy;  Laterality: N/A;  . head injury surgery    . HERNIA REPAIR Right 2012   Inguinal- [place] Na  . KIDNEY SURGERY     >30 years ago  . LAPAROSCOPIC RIGHT HEMI COLECTOMY Left 02/10/2015   Procedure:  LAPAROSCOPIC THEN OPEN RIGHT HEMI COLECTOMY;  Surgeon: [name], MD;  Location: [facility] OR[facility];  Service: General;  Laterality: Left;  . PORTACATH PLACEMENT N/A 03/24/2015   Procedure: INSERTION PORT-A-CATH;  Surgeon: Jules Husbands, MD;  Location: [facility] ORS;  Service: General;  Laterality: N/A;     SOCIAL HISTORY: Mr. Vandergriff is single (divorced) and lives alone in [place], [place].  He is currently not working; he has an upcoming disability hearing.  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  He is currently smoking, but is  continuing to work on quitting.    CURRENT MEDICATIONS:  Current Outpatient Prescriptions on File Prior to Visit  Medication Sig Dispense Refill  . albuterol (PROVENTIL HFA;VENTOLIN HFA) 108 (90 BASE) MCG/ACT inhaler Inhale 2 puffs into the lungs every 6 (six) hours as needed for wheezing or shortness of breath. 1 Inhaler 1  . amitriptyline (ELAVIL) 25 MG tablet Take 1 tablet (25 mg total) by mouth at bedtime. (Patient taking differently: Take 25 mg by mouth at bedtime as needed. ) 30 tablet 1  . atorvastatin (LIPITOR) 20 MG tablet Take 1 tablet (20 mg total) by mouth daily. 90 tablet 1  . Cyanocobalamin (B-12 COMPLIANCE INJECTION) 1000 MCG/ML KIT Inject as directed.    Marland Kitchen dexlansoprazole (DEXILANT) 60 MG capsule Take 1 capsule (60 mg total) by mouth daily. 90 capsule 1  . fludrocortisone (FLORINEF) 0.1 MG tablet Take 1 tablet (0.1 mg total) by mouth daily. (Patient not taking: Reported on 07/29/2015) 30 tablet 3  . fluorouracil CALGB 58309 in sodium chloride 0.9 % 150 mL Inject into the vein. Reported on 03/22/2015    . FLUoxetine (PROZAC) 20 MG capsule Take 20 mg by mouth 2 (two) times daily. Reported on 07/29/2015    . HYDROcodone-acetaminophen (NORCO) 5-325 MG tablet Take 1 tablet by mouth every 6 (six) hours as needed for moderate pain. 60 tablet 0  . leucovorin in dextrose 5 % 250 mL Inject into the vein every 14  (fourteen) days. Reported on 03/22/2015    . lidocaine-prilocaine (EMLA) cream Apply a quarter size amount to port site 1 hour prior to chemo. Do not rub in. Cover with plastic wrap. (Patient not taking: Reported on 09/20/2015) 30 g 3  . loperamide (IMODIUM) 2 MG capsule Take 1-2 capsules (2-4 mg total) by mouth every 6 (six) hours as needed for diarrhea or loose stools. 60 capsule 0  . ondansetron (ZOFRAN) 8 MG tablet TAKE 1 TABLET BY MOUTH EVERY 8 HOURS AS NEEDED FOR NAUSEA OR VOMITING (Patient not taking: Reported on 09/20/2015) 30 tablet 1  . OXALIPLATIN IV Inject into the vein every 14 (fourteen) days. Reported on 03/22/2015    . potassium chloride SA (K-DUR,KLOR-CON) 20 MEQ tablet Take 2 tablets (40 mEq total) by mouth daily. (Patient not taking: Reported on 09/20/2015) 60 tablet 2  . prochlorperazine (COMPAZINE) 10 MG tablet Take 1 tablet (10 mg total) by mouth every 6 (six) hours as needed for nausea or vomiting. (Patient not taking: Reported on 09/20/2015) 30 tablet 0  . VIAGRA 100 MG tablet TAKE 1/2 TO 1 TABLET BY MOUTH 1 HOUR PRIOR TO INTERCOURSE. NOT TO EXCEED 1 TABLET EVERY 24 HOURS. **EVERY 10 TABLETS IS A 90 DAY SUPPLY** (Patient not taking: Reported on 07/29/2015) 10 tablet 1   Current Facility-Administered Medications on File Prior to Visit  Medication Dose Route Frequency Provider Last Rate Last Dose  . sodium chloride flush (NS) 0.9 % injection 10 mL  10 mL Intracatheter PRN Patrici Ranks, MD   10 mL at 08/23/15 1030    ALLERGIES: No Known Allergies   PHYSICAL EXAM:  General: Male in no acute distress.  Unaccompanied today.   HEENT: Head is normocephalic.  Pupils equal and reactive to light. Conjunctivae clear without exudate.  Sclerae anicteric. Oral mucosa is pink and moist without lesions. Oropharynx is pink and moist without lesions. Lymph: No cervical, supraclavicular, or infraclavicular lymphadenopathy noted on palpation.   Cardiovascular: Normal rate and  rhythm Respiratory: Clear to auscultation bilaterally. Chest expansion symmetric without  accessory muscle use. Breathing non-labored.    GU: Deferred.   GI: Soft, non-distended.  Mildly tender to palpation "it's still a little sensitive sometimes after my surgery."  Healed midline abdominal scar.  Normoactive bowel sounds.  Neuro/Spine: No focal deficits. Steady gait with cane. Mild tenderness to palpation at lower lumbar/sacral spine.   Psych: Normal mood and affect for situation. Extremities: No edema.   Skin: Warm and dry.   LABORATORY DATA: None for this visit.   DIAGNOSTIC IMAGING:  None for this visit.    ASSESSMENT & PLAN:  Mr. Schlender is a pleasant 60 y.o. male with Stage IIIC adenocarcinoma of the transverse colon, treated with right hemicolectomy and adjuvant chemotherapy with FOLFOX; completed treatment on 09/06/15.  Patient presents to survivorship clinic today for survivorship care plan visit and to address any acute survivorship concerns since completing treatment.    1. Stage IIIC colon cancer:  Mr. Turgeon is continuing to recover from definitive treatment for colon cancer.  Today, he received a copy of his survivorship care plan (SCP) document, which was reviewed with him in detail.  The SCP details his cancer treatment history and potential late/long-term side effects of those treatments.  We discussed the follow-up schedule he can anticipate with interval imaging, CEA/lab studies, colonoscopy, and physical exams for surveillance of his cancer.  I have also shared a copy of his treatment summary/SCP with his PCP.  Mr. Wombles will return to the survivorship clinic as needed; he will return to [facility] for surveillance visit with [name], PA-C at the end of 09/2015, as previously scheduled.   2. Fatigue: Mr .Trull is likely multifactorial.  He is only about 2 weeks out from his last chemotherapy and thus is fatigue is most likely treatment related.  We discussed that  evidence supports that the best ways to combat treatment related fatigue is to be physically active. He was given a brochure for the [place] program through the [facility], which we are working on being available to patients in [place].  Currently, there are 3 locations in [place] that he would be eligible to participate in, but transportation may be a challenge for him.  Mr. Dartt does enjoy walking; this helps with his anxiety/depression as well.  I encouraged him to keep up the good work with his exercise to continue to regain his strength and endurance after completing cancer treatments.   3. Peripheral neuropathy: His bilateral peripheral neuropathy to his fingertips and toes is likely related to his platinum-based chemotherapy, Oxaliplatin.  I briefly explained how the chemo damages peripheral nerve endings and how with time these symptoms usually improve or resolve.  There is also a chance that it will not get better; however, given his reported minimal complaints, I am hopeful his neuropathy will improve with time.    4. New, sacral radiculopathy: Mr. Bail reported new numbness to his buttocks and perineal area today.  He noted this about 1 week ago.  He denies any bowel or bladder incontinence and his lower extremity strength remains, making cord compression less likely.  He has focal tenderness to his low lumbar/sacral spine area on physical exam.  He also recently fell, where he may have injured this area of his back.  I showed him a copy of the below radiculopathy chart, stating that often when there is injury to certain places in the spine, there will be associated numbness/tingling in the areas those nerves innervate.  Based on his symptoms, I wonder if  he injured part of his sacral spine during his recent syncopal episode and fall about 1 week ago; this would coincide with his new onset of symptoms.  I encouraged him to continue to monitor the numbness and if it gets worse, then he should let  us know.  I'm less suspicious that this is related to his colon cancer, but if his symptoms persist, then MRI of the lumbar/sacral spine would not be unreasonable to rule out metastatic disease.    5. Adjustment disorder with anxious mood: Mr. Clayson was seen today in conjunction with Loren Racer, LCSW, in an effort to address both the physical and social concerns of our cancer survivors at [facility].  (Please see LCSW note for additional documentation & recommendations).  It is not uncommon for this period of the patient's cancer care trajectory to be one of many emotions and stressors.  I provided support today through active listening, validation of concerns, and expressive supportive counseling.  Mr. Cobern was encouraged to take advantage of our support services programs and support groups to better cope in his new life as a cancer survivor after completing anti-cancer treatment. We also encouraged him to reach out to his counselor/therapist who has helped him most recently and re-establish care with them to continue to provide some ongoing support for him.  He was also encouraged to take advantage of different programs at the [facility], where he may become more involved in different activities that may provide him additional support as well.    6. Smoking cessation: I commended Mr. Sokolowski continued efforts to cut back on his smoking.  I gave him a packet of resources from 1-800-QUIT-NOW, which provides free coaching/support and free nicotine replacement products, like patches and lozenges, to qualified patients.    7. Physical activity/Healthy eating: Getting adequate physical activity and maintaining a healthy diet as a cancer survivor is important for overall wellness and reduces the risk of cancer recurrence. We discussed the [facility], which is a fitness program that is offered to cancer survivors free of charge.  We also reviewed "The Nutrition Rainbow"  handout, as well as the American Cancer Society's booklet with recommendations for nutrition and physical activity.    8. Health promotion/Cancer screening:   He should see his PCP at least annually, have a skin survey for skin cancers, as well as discussions about PSA testing.   I encouraged him to talk with his PCP about arranging appropriate cancer screening tests, as appropriate.  These recommendations are also on his survivorship care plan document.     Dispo:  -Return to cancer center to see [name], PA-C on 10/05/15 as previously scheduled.  -Return to survivorship clinic as needed; no additional follow-up needed at this time.  -Consider transitioning the patient to long-term survivorship, when clinically appropriate.   A total of 60 minutes was spent in face-to-face care of this patient, with greater than 50% of that time spent in counseling and care coordination.   Mike Craze, NP Survivorship Program [place] [phone]

## 2015-09-22 NOTE — Progress Notes (Signed)
[facility] CLINICAL SOCIAL WORK PSYCHOSOCIAL ASSESSMENT   Date:  09/21/15   First Name: Erik [name].: W     Last Name: [name] MRN:  [id number]  The patient was referred to Survivorship Clinic to address acute survivorship needs with Survivorship NP & Oncology Clinical Social Worker     Primary Cancer Type: Adenocarcinoma of transverse colon [facility])   Staging form: Colon and Rectum, AJCC 7th Edition   - Pathologic stage from 03/04/2015: Stage IIIC (T4a, N2a, cM0) - Signed by B[place] Cancer, PA-C on 03/04/2015        Marital Status: Divorced  Practical Problems: yes     Employment:  working on Warden/ranger for social security disability   Source of Income: public assistance and supported by community resources and friends    Insurance: medicaid   Family Problems: yes, limited support from family Concerns caring for family needs: None Identified    Emotional Problems: yes  Concerns of Adjustment to Diagnosis/Treatment: yes   Current Symptoms of Anxiety: yes   Current Symptoms of Depression: yes  Safety/Risk Concerns: None identified   Mental Status Exam Orientation: person, place, and time Affect: appropriate, anxious Thought: goal directed      Spiritual/Religious: None identified  Living Situation: alone in his apartment  Functional Status: Independent                                                                                             Strengths and Barriers:   Patient Coping Strengths:   Supportive Relationships, Friends, Financial controller, Hopefulness, Conservator, museum/gallery and Able to Communicate Effectively                                                                                                  Identified Problems/Needs and Barriers to Care:  Illness-related problems, Financial, Adjustment to Illness and Emotional/Behavioral/Cognitive    Counseling and Social Work Interventions and Recommendations:   Brief  Counseling/Psychotherapy, Data processing manager, Conservator, museum/gallery strategies, Advocacy/Education, Support Group/Group Counseling and Other  Pt encouraged to follow up with [facility], where he is an established patient for ongoing counseling.   Impressions/Plan: CSW met with pt and Survivorship NP in order to address acute survivorship concerns. CSW had assisted pt with various concerns throughout his cancer journey and is very familiar with his needs. Pt was very open with CSW and NP today discussing very common emotions related to recurrence of his cancer. NP thoroughly addressed this from a medical standpoint. CSW reminded pt of his past coping skills and supports that were helpful to him at stressful points in his cancer journey in the past. Pt was educated on the need to utilize these resources and coping skills currently in  attempt to decrease anxiety. CSW provided supportive counseling to address emotions.   Pt is very well connected in the local community with a wide variety of supports, people and resources. He utilized many of these during tough points in his cancer journey and has not relied on them recently. CSW educated pt on additional need for support during survivorship as this is a stressful transition time for cancer patients. He agrees to reach out to [facility], Erik [name] in the near future for support. CSW discussed additional options for ongoing support through [place] such as [employer] and others.   Pt has many financial concerns related to his cancer. He reports there appears to be an issue with his medicaid. CSW educated pt on how to seek assistance through billing dept and financial advocates as pt may need to provide updated medicaid number for proper billing to occur. CSW also made referral to financial advocate and spoke with Erik [name] to review patient's account. She planned to proceed and get this straight as soon as  possible.  Pt very aware of how to reach out to CSW as additional needs arise.   Erik [name], [place] Tuesdays   [phone]

## 2015-10-05 ENCOUNTER — Encounter (HOSPITAL_BASED_OUTPATIENT_CLINIC_OR_DEPARTMENT_OTHER): Payer: Medicaid Other | Admitting: Oncology

## 2015-10-05 ENCOUNTER — Encounter (HOSPITAL_COMMUNITY): Payer: Self-pay | Admitting: Oncology

## 2015-10-05 VITALS — BP 129/68 | HR 97 | Temp 98.5°F | Resp 20 | Ht 74.0 in | Wt 220.0 lb

## 2015-10-05 DIAGNOSIS — C184 Malignant neoplasm of transverse colon: Secondary | ICD-10-CM

## 2015-10-05 DIAGNOSIS — G620 Drug-induced polyneuropathy: Secondary | ICD-10-CM | POA: Diagnosis not present

## 2015-10-05 DIAGNOSIS — R911 Solitary pulmonary nodule: Secondary | ICD-10-CM

## 2015-10-05 DIAGNOSIS — E538 Deficiency of other specified B group vitamins: Secondary | ICD-10-CM

## 2015-10-05 MED ORDER — HYDROCODONE-ACETAMINOPHEN 5-325 MG PO TABS: 1.0000 | ORAL_TABLET | Freq: Four times a day (QID) | ORAL | 0 refills | Status: DC | PRN

## 2015-10-05 NOTE — Patient Instructions (Signed)
[place] at [facility] Discharge Instructions  RECOMMENDATIONS MADE BY THE CONSULTANT AND ANY TEST RESULTS WILL BE SENT TO YOUR REFERRING PHYSICIAN.  Return in 8 weeks for labs. CT scans in 2 weeks. Return for follow up in 8 weeks as well.   Thank you for choosing [facility] at [facility] to provide your oncology and hematology care.  To afford each patient quality time with our provider, please arrive at least 15 minutes before your scheduled appointment time.   Beginning January 23rd 2017 lab work for the [facility] will be done in the  Main lab at [facility] on 1st floor. If you have a lab appointment with the [facility] please come in thru the  Main Entrance and check in at the main information desk  You need to re-schedule your appointment should you arrive 10 or more minutes late.  We strive to give you quality time with our providers, and arriving late affects you and other patients whose appointments are after yours.  Also, if you no show three or more times for appointments you may be dismissed from the clinic at the providers discretion.     Again, thank you for choosing [facility].  Our hope is that these requests will decrease the amount of time that you wait before being seen by our physicians.       _____________________________________________________________  Should you have questions after your visit to [facility], please contact our office at [phone] between the hours of 8:30 a.m. and 4:30 p.m.  Voicemails left after 4:30 p.m. will not be returned until the following business day.  For prescription refill requests, have your pharmacy contact our office.         Resources For Cancer Patients and their Caregivers ? American Cancer Society: Can assist with transportation, wigs, general needs, runs Look Good Feel Better.        (562)245-7051 ? Cancer Care: Provides financial  assistance, online support groups, medication/co-pay assistance.  1-800-813-HOPE 603-541-2386) ? [facility] Assists [place] cancer patients and their families through emotional , educational and financial support.  (534) 692-2998 ? [place] DSS Where to apply for food stamps, Medicaid and utility assistance. 234-040-1101 ? [facility]: Transportation to medical appointments. (937)715-1103 ? Social Security Administration: May apply for disability if have a Stage IV cancer. (343) 524-8686 229-461-4804 ? [employer], Disability and Transit Services: Assists with nutrition, care and transit needs. [place] Support Programs: @10RELATIVEDAYS @ > Cancer Support Group  2nd Tuesday of the month 1pm-2pm, Journey Room  > Creative Journey  3rd Tuesday of the month 1130am-1pm, Journey Room  > Look Good Feel Better  1st Wednesday of the month 10am-12 noon, Journey Room (Call [place] to register [phone])

## 2015-10-05 NOTE — Assessment & Plan Note (Signed)
Continue B12 IM monthly for now. Will eventually try OTC oral B12 with close observation in future.   Oncology Flowsheet 08/23/2015  cyanocobalamin ((VITAMIN B-12)) IM 1,000 mcg

## 2015-10-05 NOTE — Assessment & Plan Note (Addendum)
Stage IIIC [facility]) adenocarcinoma of transverse colon, S/P definitive surgery with a right extended hemicolectomy by Dr. [name].  Now on systemic adjuvant therapy consisting of FOLFOX.  Oncology history is updated.  Labs in 2 months: CBC diff, CMET, CEA.  I personally reviewed and went over laboratory results with the patient.  The results are noted within this dictation.  I have refilled his Hydrocodone.  [place] Controlled Substance Reporting System is reviewed.  He continues to have ongoing issues with peripheral neuropathy, secondary to oxaliplatin.  It is grade 2.  Oxaliplatin was discontinued for his last cycle at which time 5FU/Leucovorin was given only for cycle #12.  If persistent, we can consider Neurontin/Lyrica/Cymbalta.  Order is placed for CT of chest with contrast to follow-up on pulmonary nodule.  Due to his complaint of rectal "numbness" I will also try to get CT abd/pelvis approved as well.  I will follow-up on MSI testing.  Return in 8 weeks for follow-up.  At that time, we will refer him to GI for colonoscopy per NCCN guidelines.

## 2015-10-05 NOTE — Progress Notes (Signed)
Erik Dryer, PA-C [place] [address]  Adenocarcinoma of transverse colon [facility]) - Plan: HYDROcodone-acetaminophen (NORCO) 5-325 MG tablet, CBC with Differential, Comprehensive metabolic panel, CEA, CT Chest W Contrast, CT Abdomen Pelvis W Contrast  Pulmonary nodule - Plan: HYDROcodone-acetaminophen (NORCO) 5-325 MG tablet, CBC with Differential, Comprehensive metabolic panel, CEA, CT Chest W Contrast  B12 deficiency  CURRENT THERAPY: FOLFOX beginning on 03/30/2015  INTERVAL HISTORY: Erik [name] 60 y.o. male returns for followup of Stage IIIC ([id number]) adenocarcinoma of transverse colon.    Adenocarcinoma of transverse colon ([place])   01/12/2015 Pathologic Stage    Colon, biopsy, distal transverse - TUBULOVILLOUS ADENOMA WITH HIGH GRADE DYSPLASIA.      01/12/2015 Procedure    Colonoscopy by Dr. Oneida Alar.      01/12/2015 Tumor Marker    CEA: 6.6 (H)       01/18/2015 Imaging    CT abd/pelvis- Apple-core lesion identified in the mid transverse colon without obstruction. No evidence for lymphadenopathy in the gastrohepatic ligament or omentum.  Stable 8 mm hypo attenuating lesion in the left liver, likely a cyst.      02/10/2015 Initial Diagnosis    Adenocarcinoma of transverse colon ([place])      02/12/2015 Definitive Surgery    [name], Extended right hemicolectomy       02/12/2015 Pathology Results    Mucinous adenocarcinoma with penetration of visceral peritoneum, 4/19 lymph nodes for metastatic disease, negative resection margins, with LVI and perineural invasion      03/30/2015 - 09/06/2015 Chemotherapy    FOLFOX x 12 cycles      05/25/2015 Treatment Plan Change    5 FU bolus discontinued for cycle #5      06/08/2015 Treatment Plan Change    Treatment deferred x 1 week      06/15/2015 Treatment Plan Change    5FU CI decreased by 10% and Oxaliplatin reduced by 15% for cycles #6-#11; Oxaliplatin dropped for cycle #12 d/t neuropathy.          He has completed adjuvant therapy.  He does have residual peripheral neuropathy.  He notes that it has worsened since completing therapy.  He reports that in the past, it improved prior to his next treatment.  He notes that it is limited to his hands and feet.  He also notes, interestingly, that he has rectal numbness as well.  He reports that this symptom is only present after ambulating.  When sitting or resting, it is not present.  He denies any issues with incontinence or rectal bleeding.  He denies any bowel changes.  He is seen wearing a button down shirt and he admits that buttoned the buttons on the shirt.  He notes that he visually must see what his finger/hand manipulation are doing in order for accurate manipulation of his fingers.  He is able to pick up small objects, sign his signatures, etc.  Review of Systems  Constitutional: Negative.  Negative for chills, fever and weight loss.  HENT: Negative.   Eyes: Negative.   Respiratory: Negative.   Cardiovascular: Negative.   Gastrointestinal: Positive for diarrhea (Chronic). Negative for nausea and vomiting.  Genitourinary: Negative.   Musculoskeletal: Negative.   Skin: Negative.   Neurological: Positive for tingling. Negative for weakness.  Endo/Heme/Allergies: Negative.   Psychiatric/Behavioral: Negative.     Past Medical History:  Diagnosis Date  . Abnormal stress echocardiogram   . Adenocarcinoma of transverse colon ([place]) 02/10/2015  . Alcohol abuse    Heavy Use up until 2010  . Anxiety   . Depression   . GERD (gastroesophageal reflux disease)   . Head trauma 2001   closed head injury; coma for 4 weeks  . Hypercholesterolemia   . Hypertension   . PTSD (post-traumatic stress disorder)     Past Surgical History:  Procedure Laterality Date  . cardiac cath    . COLONOSCOPY  2011   Dr. Oneida Alar: multiple adenomas and  hyperplastic polyps  . COLONOSCOPY WITH PROPOFOL N/A 01/12/2015   Procedure: COLONOSCOPY WITH PROPOFOL;  Surgeon: Danie Binder, MD;  [facility];  Service: Endoscopy;  Laterality: N/A;  1030  . CRANIOTOMY  2001  . ESOPHAGOGASTRODUODENOSCOPY (EGD) WITH PROPOFOL N/A 01/12/2015   Procedure: ESOPHAGOGASTRODUODENOSCOPY (EGD) WITH PROPOFOL;  Surgeon: Danie Binder, MD;  [facility]: [facility];  Service: Endoscopy;  Laterality: N/A;  . head injury surgery    . HERNIA REPAIR Right 2012   Inguinal- [place]  . KIDNEY SURGERY     >30 years ago  . LAPAROSCOPIC RIGHT HEMI COLECTOMY Left 02/10/2015   Procedure: LAPAROSCOPIC THEN OPEN RIGHT HEMI COLECTOMY;  Surgeon: [name], MD;  Location: [facility] ORS;  Service: General;  Laterality: Left;  . PORTACATH PLACEMENT N/A 03/24/2015   Procedure: INSERTION PORT-A-CATH;  Surgeon: Jules Husbands, MD;  Location: [facility] ORS;  Service: General;  Laterality: N/A;    Family History  Problem Relation Age of Onset  . Pulmonary embolism Mother   . Breast cancer Mother   . Cancer Mother   . Heart disease Father   . Cancer Father 48    Leukemia  . Cancer Paternal Grandfather     Lung  . Ataxia Neg Hx   . Chorea Neg Hx   . Dementia Neg Hx   . Mental retardation Neg Hx   . Migraines Neg Hx   . Multiple sclerosis Neg Hx   . Neurofibromatosis Neg Hx   . Neuropathy Neg Hx   . Parkinsonism Neg Hx   . Seizures Neg Hx   . Stroke Neg Hx   . Colon cancer Neg Hx     Social History   Social History  . Marital status: Divorced    Spouse name: N/A  . Number of children: N/A  . Years of education: N/A   Occupational History  . Full time    Social History Main Topics  . Smoking status: Current Some Day Smoker    Packs/day: 0.10    Types: Cigarettes  . Smokeless tobacco: Never Used     Comment: has not had a cigarette since 09-18-15  . Alcohol use 0.0 oz/week     Comment: beer occ, history of ETOH abuse in remote past.   . Drug use: No      Comment: marijuana last 2012  . Sexual activity: Yes    Partners: Female   Other Topics Concern  . None  Social History Narrative   Single     PHYSICAL EXAMINATION  ECOG PERFORMANCE STATUS: 1 - Symptomatic but completely ambulatory  Vitals:   10/05/15 1434  BP: 129/68  Pulse: 97  Resp: 20  Temp: 98.5 F (36.9 C)     GENERAL:alert, no distress, well nourished, well developed, comfortable, cooperative, smiling and unaccompanied.Marland Kitchen SKIN: skin color, texture, turgor are normal, no rashes or significant lesions HEAD: Normocephalic, No masses, lesions, tenderness or abnormalities EYES: normal, EOMI, Conjunctiva are pink and non-injected EARS: External ears normal OROPHARYNX:lips, buccal mucosa, and tongue normal and mucous membranes are moist  NECK: supple, trachea midline LYMPH:  not examined BREAST:not examined LUNGS: clear to auscultation and percussion HEART: regular rate & rhythm, no murmurs, no gallops, S1 normal and S2 normal ABDOMEN:abdomen soft and normal bowel sounds BACK: Back symmetric, no curvature. EXTREMITIES:less then 2 second capillary refill, no joint deformities, effusion, or inflammation, no skin discoloration, no cyanosis  NEURO: alert & oriented x 3 with fluent speech, no focal motor/sensory deficits, gait normal   LABORATORY DATA: CBC    Component Value Date/Time   WBC 6.5 09/06/2015 0923   RBC 4.38 09/06/2015 0923   HGB 13.9 09/06/2015 0923   HCT 41.7 09/06/2015 0923   PLT 129 (L) 09/06/2015 0923   MCV 95.2 09/06/2015 0923   MCH 31.7 09/06/2015 0923   MCHC 33.3 09/06/2015 0923   RDW 13.9 09/06/2015 0923   LYMPHSABS 1.2 09/06/2015 0923   MONOABS 0.8 09/06/2015 0923   EOSABS 0.2 09/06/2015 0923   BASOSABS 0.0 09/06/2015 0923      Chemistry      Component Value Date/Time   NA 136 09/06/2015 0923   K 3.9 09/06/2015 0923   CL 102 09/06/2015 0923   CO2 28 09/06/2015 0923   BUN 8 09/06/2015 0923   CREATININE 0.75 09/06/2015 0923    CREATININE 0.86 12/16/2014 0844      Component Value Date/Time   CALCIUM 9.0 09/06/2015 0923   ALKPHOS 75 09/06/2015 0923   AST 21 09/06/2015 0923   ALT 17 09/06/2015 0923   BILITOT 0.6 09/06/2015 0923        PENDING LABS:   RADIOGRAPHIC STUDIES:  No results found.   PATHOLOGY:    ASSESSMENT AND PLAN:  Adenocarcinoma of transverse colon ([place]) Stage IIIC ([id number]) adenocarcinoma of transverse colon, S/P definitive surgery with a right extended hemicolectomy by Dr. [name].  Now on systemic adjuvant therapy consisting of FOLFOX.  Oncology history is updated.  Labs in 2 months: CBC diff, CMET, CEA.  I personally reviewed and went over laboratory results with the patient.  The results are noted within this dictation.  I have refilled his Hydrocodone.  [place] Controlled Substance Reporting System is reviewed.  He continues to have ongoing issues with peripheral neuropathy, secondary to oxaliplatin.  It is grade 2.  Oxaliplatin was discontinued for his last cycle at which time 5FU/Leucovorin was given only for cycle #12.  If persistent, we can consider Neurontin/Lyrica/Cymbalta.  Order is placed for CT of chest with contrast to follow-up on pulmonary nodule.  Due to his complaint of rectal "numbness" I will also try to get CT abd/pelvis approved as well.  I will follow-up on MSI testing.  Return in 8 weeks for follow-up.  At that time, we will refer him to GI for colonoscopy per NCCN guidelines.  B12 deficiency Continue B12 IM monthly for now. Will eventually try OTC oral B12 with close observation in future.   Oncology  Flowsheet 08/23/2015  cyanocobalamin ((VITAMIN B-12)) IM 1,000 mcg       ORDERS PLACED FOR THIS ENCOUNTER: Orders Placed This Encounter  Procedures  . CT Chest W Contrast  . CT Abdomen Pelvis W Contrast  . CBC with Differential  . Comprehensive metabolic panel  . CEA    MEDICATIONS PRESCRIBED THIS ENCOUNTER: Meds ordered this  encounter  Medications  . HYDROcodone-acetaminophen (NORCO) 5-325 MG tablet    Sig: Take 1 tablet by mouth every 6 (six) hours as needed for moderate pain.    Dispense:  60 tablet    Refill:  0    Order Specific Question:   Supervising Provider    Answer:   Patrici Ranks [id number]    THERAPY PLAN:  Continue with treatment as planned.  All questions were answered. The patient knows to call the clinic with any problems, questions or concerns. We can certainly see the patient much sooner if necessary.  Patient and plan discussed with Dr. Ancil Linsey and she is in agreement with the aforementioned.   This note is electronically signed by: Doy Mince 10/05/2015 10:24 PM

## 2015-10-06 ENCOUNTER — Encounter (HOSPITAL_COMMUNITY): Payer: Self-pay | Admitting: Emergency Medicine

## 2015-10-06 NOTE — Progress Notes (Signed)
Faxed information to [facility] for MSI testing.  Confirmed with Katrina that information was received.

## 2015-10-07 ENCOUNTER — Other Ambulatory Visit (HOSPITAL_COMMUNITY): Payer: Self-pay | Admitting: Oncology

## 2015-10-07 DIAGNOSIS — C184 Malignant neoplasm of transverse colon: Secondary | ICD-10-CM

## 2015-10-13 LAB — SURGICAL PATHOLOGY

## 2015-10-20 ENCOUNTER — Ambulatory Visit (HOSPITAL_COMMUNITY)
Admission: RE | Admit: 2015-10-20 | Discharge: 2015-10-20 | Disposition: A | Discharge status: Home / Self Care | Payer: Medicaid Other | Source: Ambulatory Visit | Attending: Oncology | Admitting: Oncology

## 2015-10-20 ENCOUNTER — Encounter (HOSPITAL_COMMUNITY): Payer: Medicaid Other | Attending: Hematology & Oncology

## 2015-10-20 DIAGNOSIS — J439 Emphysema, unspecified: Secondary | ICD-10-CM | POA: Diagnosis not present

## 2015-10-20 DIAGNOSIS — C184 Malignant neoplasm of transverse colon: Secondary | ICD-10-CM

## 2015-10-20 DIAGNOSIS — K6289 Other specified diseases of anus and rectum: Secondary | ICD-10-CM | POA: Diagnosis present

## 2015-10-20 DIAGNOSIS — I700 Atherosclerosis of aorta: Secondary | ICD-10-CM | POA: Insufficient documentation

## 2015-10-20 DIAGNOSIS — I2510 Atherosclerotic heart disease of native coronary artery without angina pectoris: Secondary | ICD-10-CM | POA: Diagnosis not present

## 2015-10-20 DIAGNOSIS — R911 Solitary pulmonary nodule: Secondary | ICD-10-CM | POA: Diagnosis not present

## 2015-10-20 DIAGNOSIS — Z9049 Acquired absence of other specified parts of digestive tract: Secondary | ICD-10-CM | POA: Diagnosis not present

## 2015-10-20 MED ORDER — IOPAMIDOL (ISOVUE-300) INJECTION 61%
100.0000 mL | Freq: Once | INTRAVENOUS | Status: AC | PRN
  Administered 2015-10-20: 100 mL via INTRAVENOUS

## 2015-10-20 MED ORDER — CYANOCOBALAMIN 1000 MCG/ML IJ SOLN
INTRAMUSCULAR | Status: AC
  Filled 2015-10-20: qty 1

## 2015-10-25 ENCOUNTER — Other Ambulatory Visit (HOSPITAL_COMMUNITY): Payer: Self-pay | Admitting: Emergency Medicine

## 2015-10-25 MED ORDER — GABAPENTIN 300 MG PO CAPS: ORAL_CAPSULE | ORAL | 1 refills | Status: DC

## 2015-10-25 NOTE — Progress Notes (Signed)
Pt states that he is still having problems with peripheral neuropathy in feet, legs, and hands.  Spoke with Dr Whitney Muse.  We are going to start pt on neurotin 300 mg BID.  He can increase the pm dose by 300 mg every 7 days to a max of 900 mg at night.  This is the starting dose we want him to take right now.  We can still increase this dose if it doesn't help.  Pt verbalized understanding.

## 2015-11-04 ENCOUNTER — Other Ambulatory Visit (HOSPITAL_COMMUNITY): Payer: Self-pay | Admitting: Oncology

## 2015-11-04 DIAGNOSIS — C184 Malignant neoplasm of transverse colon: Secondary | ICD-10-CM

## 2015-11-19 ENCOUNTER — Encounter (HOSPITAL_COMMUNITY): Payer: Medicaid Other | Attending: Hematology & Oncology

## 2015-11-19 ENCOUNTER — Encounter (HOSPITAL_COMMUNITY): Payer: Self-pay

## 2015-11-19 VITALS — BP 116/64 | HR 73 | Temp 98.2°F | Resp 18

## 2015-11-19 DIAGNOSIS — C184 Malignant neoplasm of transverse colon: Secondary | ICD-10-CM | POA: Insufficient documentation

## 2015-11-19 DIAGNOSIS — E538 Deficiency of other specified B group vitamins: Secondary | ICD-10-CM | POA: Diagnosis present

## 2015-11-19 MED ORDER — CYANOCOBALAMIN 1000 MCG/ML IJ SOLN
1000.0000 ug | Freq: Once | INTRAMUSCULAR | Status: AC
  Administered 2015-11-19: 1000 ug via INTRAMUSCULAR
  Filled 2015-11-19: qty 1

## 2015-11-19 NOTE — Patient Instructions (Signed)
[facility] Discharge Instructions  RECOMMENDATIONS MADE BY THE CONSULTANT AND ANY TEST RESULTS WILL BE SENT TO YOUR REFERRING PHYSICIAN.   You were given a B12 injection today. Return as scheduled.   Thank you for choosing [facility] at [facility] to provide your oncology and hematology care.  To afford each patient quality time with our provider, please arrive at least 15 minutes before your scheduled appointment time.   Beginning January 23rd 2017 lab work for the Cancer Center will be done in the  Main lab at [facility] on 1st floor. If you have a lab appointment with the Cancer Center please come in thru the  Main Entrance and check in at the main information desk  You need to re-schedule your appointment should you arrive 10 or more minutes late.  We strive to give you quality time with our providers, and arriving late affects you and other patients whose appointments are after yours.  Also, if you no show three or more times for appointments you may be dismissed from the clinic at the providers discretion.     Again, thank you for choosing [facility].  Our hope is that these requests will decrease the amount of time that you wait before being seen by our physicians.       _____________________________________________________________  Should you have questions after your visit to [facility], please contact our office at [phone] between the hours of 8:30 a.m. and 4:30 p.m.  Voicemails left after 4:30 p.m. will not be returned until the following business day.  For prescription refill requests, have your pharmacy contact our office.         Resources For Cancer Patients and their Caregivers ? American Cancer Society: Can assist with transportation, wigs, general needs, runs Look Good Feel Better.        1-888-227-6333 ? Cancer Care: Provides financial assistance, online support groups,  medication/co-pay assistance.  1-800-813-HOPE (4673) ? [facility] Assists [place] cancer patients and their families through emotional , educational and financial support.  336-427-4357 ? [place] DSS Where to apply for food stamps, Medicaid and utility assistance. 336-342-1394 ? [facility]: Transportation to medical appointments. 336-347-2287 ? Social Security Administration: May apply for disability if have a Stage IV cancer. 336-342-7796 1-800-772-1213 ? [place] Aging, Disability and Transit Services: Assists with nutrition, care and transit needs. 336-349-2343  Cancer Center Support Programs: @10RELATIVEDAYS@ > Cancer Support Group  2nd Tuesday of the month 1pm-2pm, Journey Room  > Creative Journey  3rd Tuesday of the month 1130am-1pm, Journey Room  > Look Good Feel Better  1st Wednesday of the month 10am-12 noon, Journey Room (Call American Cancer Society to register 1-800-395-5775)   

## 2015-11-19 NOTE — Progress Notes (Signed)
Pt here today for B12 injection. Pt given injection in right deltoid. Pt tolerated well. Pt stable and discharged home ambulatory. Pt declined flu shot.  Return as scheduled.

## 2015-11-26 ENCOUNTER — Other Ambulatory Visit (HOSPITAL_COMMUNITY): Payer: Self-pay | Admitting: Emergency Medicine

## 2015-11-26 DIAGNOSIS — R911 Solitary pulmonary nodule: Secondary | ICD-10-CM

## 2015-11-26 DIAGNOSIS — C184 Malignant neoplasm of transverse colon: Secondary | ICD-10-CM

## 2015-11-26 MED ORDER — HYDROCODONE-ACETAMINOPHEN 5-325 MG PO TABS
1.0000 | ORAL_TABLET | Freq: Four times a day (QID) | ORAL | 0 refills | Status: DC | PRN
Start: 2015-11-26 — End: 2016-02-14

## 2015-11-26 NOTE — Progress Notes (Signed)
Hydrocodone refilled.  Told pt it would be ready today at the front nurses station at the [facility]

## 2015-11-30 ENCOUNTER — Ambulatory Visit (HOSPITAL_COMMUNITY): Payer: Medicaid Other | Admitting: Oncology

## 2015-11-30 ENCOUNTER — Other Ambulatory Visit (HOSPITAL_COMMUNITY): Payer: Medicaid Other

## 2015-11-30 ENCOUNTER — Other Ambulatory Visit (HOSPITAL_COMMUNITY): Payer: Self-pay | Admitting: Oncology

## 2015-11-30 DIAGNOSIS — C184 Malignant neoplasm of transverse colon: Secondary | ICD-10-CM

## 2015-12-02 ENCOUNTER — Other Ambulatory Visit (HOSPITAL_COMMUNITY): Payer: Self-pay

## 2015-12-02 DIAGNOSIS — T451X5A Adverse effect of antineoplastic and immunosuppressive drugs, initial encounter: Principal | ICD-10-CM

## 2015-12-02 DIAGNOSIS — G620 Drug-induced polyneuropathy: Secondary | ICD-10-CM

## 2015-12-02 MED ORDER — GABAPENTIN 300 MG PO CAPS: ORAL_CAPSULE | ORAL | 1 refills | Status: DC

## 2015-12-02 NOTE — Telephone Encounter (Signed)
Received refill request from patients pharmacy for gabapentin. Chart checked and refilled.

## 2015-12-14 ENCOUNTER — Encounter: Payer: Self-pay | Admitting: Gastroenterology

## 2015-12-15 ENCOUNTER — Encounter (HOSPITAL_COMMUNITY): Payer: Medicaid Other | Attending: Oncology | Admitting: Oncology

## 2015-12-15 ENCOUNTER — Encounter (HOSPITAL_COMMUNITY): Payer: Medicaid Other | Attending: Hematology & Oncology

## 2015-12-15 ENCOUNTER — Encounter (HOSPITAL_COMMUNITY): Payer: Self-pay | Admitting: Oncology

## 2015-12-15 VITALS — BP 132/65 | HR 83 | Temp 98.5°F | Resp 16 | Ht 74.0 in | Wt 225.9 lb

## 2015-12-15 DIAGNOSIS — R911 Solitary pulmonary nodule: Secondary | ICD-10-CM

## 2015-12-15 DIAGNOSIS — T451X5A Adverse effect of antineoplastic and immunosuppressive drugs, initial encounter: Secondary | ICD-10-CM

## 2015-12-15 DIAGNOSIS — E538 Deficiency of other specified B group vitamins: Secondary | ICD-10-CM | POA: Diagnosis not present

## 2015-12-15 DIAGNOSIS — C184 Malignant neoplasm of transverse colon: Secondary | ICD-10-CM

## 2015-12-15 DIAGNOSIS — G620 Drug-induced polyneuropathy: Secondary | ICD-10-CM

## 2015-12-15 DIAGNOSIS — F17219 Nicotine dependence, cigarettes, with unspecified nicotine-induced disorders: Secondary | ICD-10-CM

## 2015-12-15 LAB — CBC WITH DIFFERENTIAL/PLATELET
BASOS ABS: 0 10*3/uL (ref 0.0–0.1)
BASOS PCT: 1 %
EOS ABS: 0.2 10*3/uL (ref 0.0–0.7)
EOS PCT: 2 %
HCT: 41.7 % (ref 39.0–52.0)
Hemoglobin: 14.1 g/dL (ref 13.0–17.0)
Lymphocytes Relative: 28 %
Lymphs Abs: 2 10*3/uL (ref 0.7–4.0)
MCH: 31.5 pg (ref 26.0–34.0)
MCHC: 33.8 g/dL (ref 30.0–36.0)
MCV: 93.1 fL (ref 78.0–100.0)
MONO ABS: 0.5 10*3/uL (ref 0.1–1.0)
Monocytes Relative: 7 %
Neutro Abs: 4.5 10*3/uL (ref 1.7–7.7)
Neutrophils Relative %: 62 %
PLATELETS: 200 10*3/uL (ref 150–400)
RBC: 4.48 MIL/uL (ref 4.22–5.81)
RDW: 12.5 % (ref 11.5–15.5)
WBC: 7.2 10*3/uL (ref 4.0–10.5)

## 2015-12-15 LAB — COMPREHENSIVE METABOLIC PANEL
ALT: 16 U/L — AB (ref 17–63)
AST: 18 U/L (ref 15–41)
Albumin: 4.1 g/dL (ref 3.5–5.0)
Alkaline Phosphatase: 82 U/L (ref 38–126)
Anion gap: 6 (ref 5–15)
BUN: 13 mg/dL (ref 6–20)
CHLORIDE: 104 mmol/L (ref 101–111)
CO2: 28 mmol/L (ref 22–32)
CREATININE: 0.79 mg/dL (ref 0.61–1.24)
Calcium: 9.3 mg/dL (ref 8.9–10.3)
Glucose, Bld: 95 mg/dL (ref 65–99)
POTASSIUM: 4.1 mmol/L (ref 3.5–5.1)
SODIUM: 138 mmol/L (ref 135–145)
Total Bilirubin: 0.6 mg/dL (ref 0.3–1.2)
Total Protein: 7.6 g/dL (ref 6.5–8.1)

## 2015-12-15 MED ORDER — CYANOCOBALAMIN 1000 MCG/ML IJ SOLN
1000.0000 ug | Freq: Once | INTRAMUSCULAR | Status: AC
  Administered 2015-12-15: 1000 ug via INTRAMUSCULAR

## 2015-12-15 MED ORDER — GABAPENTIN 300 MG PO CAPS: 1200.0000 mg | ORAL_CAPSULE | Freq: Every day | ORAL | 1 refills | Status: DC

## 2015-12-15 MED ORDER — CYANOCOBALAMIN 1000 MCG/ML IJ SOLN
INTRAMUSCULAR | Status: AC
  Filled 2015-12-15: qty 1

## 2015-12-15 NOTE — Progress Notes (Signed)
Erik Dryer, PA-C Erik [name]  Adenocarcinoma of transverse colon Erik [name]) - Plan: CBC with Differential, Comprehensive metabolic panel, CEA, Ferritin  Pulmonary nodule - Plan: CT Chest Wo Contrast  Cigarette nicotine dependence with nicotine-induced disorder - Plan: CT Chest Wo Contrast  B12 deficiency - Plan: SCHEDULING COMMUNICATION INJECTION, cyanocobalamin ((VITAMIN B-12)) injection 1,000 mcg  Neuropathy due to chemotherapeutic drug ([place]) - Plan: gabapentin (NEURONTIN) 300 MG capsule  CURRENT THERAPY: FOLFOX beginning on 03/30/2015  INTERVAL HISTORY: Erik [name] 60 y.o. male returns for followup of Stage IIIC Erik [name]) adenocarcinoma of transverse colon.    Adenocarcinoma of transverse colon (Erik [name])   01/12/2015 Pathologic Stage    Colon, biopsy, distal transverse - TUBULOVILLOUS ADENOMA WITH HIGH GRADE DYSPLASIA.      01/12/2015 Procedure    Colonoscopy by Dr. Oneida [name].      01/12/2015 Tumor Marker    CEA: 6.6 (H)       01/18/2015 Imaging    CT abd/pelvis- Apple-core lesion identified in the mid transverse colon without obstruction. No evidence for lymphadenopathy in the gastrohepatic ligament or omentum.  Stable 8 mm hypo attenuating lesion in the left liver, likely a cyst.      02/10/2015 Initial Diagnosis    Adenocarcinoma of transverse colon ([place])      02/12/2015 Definitive Surgery    Erik [name], Extended right hemicolectomy       02/12/2015 Pathology Results    Mucinous adenocarcinoma with penetration of visceral peritoneum, 4/19 lymph nodes for metastatic disease, negative resection margins, with LVI and perineural invasion      03/30/2015 - 09/06/2015 Chemotherapy    FOLFOX x 12 cycles      05/25/2015 Treatment Plan Change    5 FU bolus discontinued for cycle #5      06/08/2015 Treatment Plan Change    Treatment deferred x 1 week      06/15/2015 Treatment Plan Change    5FU CI decreased by 10% and Oxaliplatin  reduced by 15% for cycles #6-#11; Oxaliplatin dropped for cycle #12 d/t neuropathy.       10/20/2015 Imaging    CT CAP- Right hemicolectomy without evidence of metastatic disease. 2. Previously measured ground-glass lesion in the left upper lobe has resolved. 3. Probable food debris in the stomach, simulating gastric wall thickening. Please correlate clinically. 4. 6 mm irregular nodular density in the left upper lobe, stable. Continued attention on followup exams is warranted.      He notes stable peripheral neuropathy.  He explains how this affects his ADLs.  He notes that he can button buttons, sign his name, and other fine motor manipulations, BUT, he must watch.  Without visualization of his activities, he cannot perform many ADLs.  Otherwise, he denies any complaints.   Review of Systems  Constitutional: Negative.  Negative for chills, fever and weight loss.  HENT: Negative.   Eyes: Negative.   Respiratory: Negative.  Negative for cough.   Cardiovascular: Negative.  Negative for chest pain.  Gastrointestinal: Negative.  Negative for abdominal pain, constipation, nausea and vomiting.  Genitourinary: Negative.  Negative for dysuria.  Musculoskeletal: Negative.  Negative for falls.  Skin: Negative.   Neurological: Positive for tingling and sensory change. Negative for weakness.  Endo/Heme/Allergies: Negative.   Psychiatric/Behavioral: Negative.     Past Medical History:  Diagnosis Date  . Abnormal stress echocardiogram   . Adenocarcinoma of transverse colon ([place]) 02/10/2015  . Alcohol abuse  Heavy Use up until 2010  . Anxiety   . Depression   . GERD (gastroesophageal reflux disease)   . Head trauma 2001   closed head injury; coma for 4 weeks  . Hypercholesterolemia   . Hypertension   . PTSD (post-traumatic stress disorder)     Past Surgical History:  Procedure Laterality Date  . cardiac cath    . COLONOSCOPY  2011   Dr. Oneida [name]: multiple adenomas and hyperplastic  polyps  . COLONOSCOPY WITH PROPOFOL N/A 01/12/2015   Procedure: COLONOSCOPY WITH PROPOFOL;  Surgeon: Erik Binder, MD;  [facility];  Service: Endoscopy;  Laterality: N/A;  1030  . CRANIOTOMY  2001  . ESOPHAGOGASTRODUODENOSCOPY (EGD) WITH PROPOFOL N/A 01/12/2015   Procedure: ESOPHAGOGASTRODUODENOSCOPY (EGD) WITH PROPOFOL;  Surgeon: Erik Binder, MD;  Location: [facility];  Service: Endoscopy;  Laterality: N/A;  . head injury surgery    . HERNIA REPAIR Right 2012   Inguinal- Erik [name]  . KIDNEY SURGERY     >30 years ago  . LAPAROSCOPIC RIGHT HEMI COLECTOMY Left 02/10/2015   Procedure: LAPAROSCOPIC THEN OPEN RIGHT HEMI COLECTOMY;  Surgeon: Erik Pert, MD;  Location: [facility];  Service: General;  Laterality: Left;  . PORTACATH PLACEMENT N/A 03/24/2015   Procedure: INSERTION PORT-A-CATH;  Surgeon: Erik Husbands, MD;  Location: [facility];  Service: General;  Laterality: N/A;    Family History  Problem Relation Age of Onset  . Pulmonary embolism Mother   . Breast cancer Mother   . Cancer Mother   . Heart disease Father   . Cancer Father 39    Leukemia  . Cancer Paternal Grandfather     Lung  . Ataxia Neg Hx   . Chorea Neg Hx   . Dementia Neg Hx   . Mental retardation Neg Hx   . Migraines Neg Hx   . Multiple sclerosis Neg Hx   . Neurofibromatosis Neg Hx   . Neuropathy Neg Hx   . Parkinsonism Neg Hx   . Seizures Neg Hx   . Stroke Neg Hx   . Colon cancer Neg Hx     Social History   Social History  . Marital status: Divorced    Spouse name: N/A  . Number of children: N/A  . Years of education: N/A   Occupational History  . Full time    Social History Main Topics  . Smoking status: Current Some Day Smoker    Packs/day: 0.10    Types: Cigarettes  . Smokeless tobacco: Never Used     Comment: has not had a cigarette since 09-18-15  . Alcohol use 0.0 oz/week     Comment: beer occ, history of ETOH abuse in remote past.   . Drug use: No     Comment:  marijuana last 2012  . Sexual activity: Yes    Partners: Female   Other Topics Concern  . None   Social History Narrative   Single     PHYSICAL EXAMINATION  ECOG PERFORMANCE STATUS: 1 - Symptomatic but completely ambulatory  Vitals:   12/15/15 1424  BP: 132/65  Pulse: 83  Resp: 16  Temp: 98.5 F (36.9 C)     GENERAL:alert, no distress, well nourished, well developed, comfortable, cooperative, smiling and unaccompanied. SKIN: skin color, texture, turgor are normal, no rashes or significant lesions HEAD: Normocephalic, No masses, lesions, tenderness or abnormalities EYES: normal, EOMI, Conjunctiva are pink and non-injected EARS: External ears normal OROPHARYNX:lips, buccal mucosa, and tongue normal  and mucous membranes are moist  NECK: supple, trachea midline LYMPH:  not examined BREAST:not examined LUNGS: clear to auscultation and percussion HEART: regular rate & rhythm, no murmurs, no gallops, S1 normal and S2 normal ABDOMEN:abdomen soft and normal bowel sounds BACK: Back symmetric, no curvature. EXTREMITIES:less then 2 second capillary refill, no joint deformities, effusion, or inflammation, no skin discoloration, no cyanosis  NEURO: alert & oriented x 3 with fluent speech, no focal motor/sensory deficits, gait normal   LABORATORY DATA: CBC    Component Value Date/Time   WBC 7.2 12/15/2015 1319   RBC 4.48 12/15/2015 1319   HGB 14.1 12/15/2015 1319   HCT 41.7 12/15/2015 1319   PLT 200 12/15/2015 1319   MCV 93.1 12/15/2015 1319   MCH 31.5 12/15/2015 1319   MCHC 33.8 12/15/2015 1319   RDW 12.5 12/15/2015 1319   LYMPHSABS 2.0 12/15/2015 1319   MONOABS 0.5 12/15/2015 1319   EOSABS 0.2 12/15/2015 1319   BASOSABS 0.0 12/15/2015 1319      Chemistry      Component Value Date/Time   [name] 138 12/15/2015 1319   K 4.1 12/15/2015 1319   CL 104 12/15/2015 1319   CO2 28 12/15/2015 1319   BUN 13 12/15/2015 1319   CREATININE 0.79 12/15/2015 1319   CREATININE 0.86  12/16/2014 0844      Component Value Date/Time   CALCIUM 9.3 12/15/2015 1319   ALKPHOS 82 12/15/2015 1319   AST 18 12/15/2015 1319   ALT 16 (L) 12/15/2015 1319   BILITOT 0.6 12/15/2015 1319     Lab Results  Component Value Date   CEA 2.6 03/09/2015   Lab Results  Component Value Date   IRON 45 03/09/2015   TIBC 361 03/09/2015   FERRITIN 52 03/09/2015     PENDING LABS:   RADIOGRAPHIC STUDIES:  No results found.   PATHOLOGY:    ASSESSMENT AND PLAN:  Adenocarcinoma of transverse colon ([place]) Stage IIIC [id number]) adenocarcinoma of transverse colon, S/P definitive surgery with a right extended hemicolectomy by Dr. Clayburn [name].  Now on systemic adjuvant therapy consisting of FOLFOX.  Oncology history is updated.  Labs today: CBC diff, CMET, CEA.  I personally reviewed and went over laboratory results with the patient.  The results are noted within this dictation.  Labs in 3 months: CBC diff, CMET, CEA, ferritin.  I personally reviewed and went over radiographic studies with the patient.  The results are noted within this dictation.  I reviewed his CT CAP from 10/20/2015.  This test confirmed remission of disease.  We will repeat CT abd/pelvis w contrast in 12 months (sooner if indicated).  Given his pulmonary nodule, will repeat CT of chest wo contrast in 6 months to confirm stability.  Will refer to Dr. Oneida [name], GI, for colonoscopy to be completed within 1 year of finishing treatment.   He continues to have ongoing issues with peripheral neuropathy, secondary to oxaliplatin.  It is grade 2.  Oxaliplatin was discontinued for his last cycle at which time 5FU/Leucovorin was given only for cycle #12.  He is provided education regarding oxaliplatin-induced peripheral neuropathy.  I have increased his Gabapentin to 1200 mg PO at HS.  Return in 12 weeks for follow-up.    B12 deficiency B12 deficiency with level at 149.  Continue B12 IM monthly.   ORDERS PLACED FOR THIS  ENCOUNTER: Orders Placed This Encounter  Procedures  . CT Chest Wo Contrast  . CBC with Differential  . Comprehensive metabolic panel  .  CEA  . Ferritin  . SCHEDULING COMMUNICATION INJECTION    MEDICATIONS PRESCRIBED THIS ENCOUNTER: Meds ordered this encounter  Medications  . gabapentin (NEURONTIN) 300 MG capsule    Sig: Take 4 capsules (1,200 mg total) by mouth at bedtime.    Dispense:  120 capsule    Refill:  1    Order Specific Question:   Supervising Provider    Answer:   Patrici Ranks [id number]  . cyanocobalamin ((VITAMIN B-12)) injection 1,000 mcg    THERAPY PLAN:  Continue monthly B12 injections.  NCCN guidelines for surveillance for Colon cancer are as follows (1.2017):  A. Stage I   1. Colonoscopy at year 1    A. If advanced adenoma, repeat in 1 year    B. If no advanced adenoma, repeat in 3 years, and then every 5 years.  B. Stage II, Stage III   1. H+P every 3-6 months x 2 years and then every 6 months for a total of 5 years    2. CEA every 3-6 months x 2 years and then every 6 months for a total of 5 years    3. CT CAP every 6-12 months (category 2B for frequency < 12 months) for a total of 5 years .   4.  Colonoscopy in 1 year except if no preoperative colonoscopy due to obstructing lesion, colonoscopy in 3-6 months.     A. If advanced adenoma, repeat in 1 year    B. If no advanced adenoma, repeat in 3 years, then every 5 years   5. PET/CT scan is not recommended.  C. Stage IV   1. H+P every 3-6 months x 2 years and then every 6 months for a total of 5 years    2. CEA every 3 months x 2 years and then every 6 months for a total of 3- 5 years    3. CT CAP every 3-6 months (category 2B for frequency < 6 months) x 2 years., then every 6-12 months for a total of 5 years .   4. Colonoscopy in 1 year except if no preoperative colonoscopy due to obstructing lesion, colonoscopy in 3-6 months.     A. If advanced adenoma, repeat in 1 year    B. If no advanced  adenoma, repeat in 3 years, then every 5 years   All questions were answered. The patient knows to call the clinic with any problems, questions or concerns. We can certainly see the patient much sooner if necessary.  Patient and plan discussed with Dr. Ancil Linsey and she is in agreement with the aforementioned.   This note is electronically signed by: Doy Mince 12/15/2015 5:05 PM

## 2015-12-15 NOTE — Progress Notes (Signed)
Pt given B12 injection. Pt tolerated well. Pt stable and discharged home ambulatory.

## 2015-12-15 NOTE — Assessment & Plan Note (Addendum)
B12 deficiency with level at 149.  Continue B12 IM monthly.

## 2015-12-15 NOTE — Patient Instructions (Signed)
[place] at [facility] Discharge Instructions  RECOMMENDATIONS MADE BY THE CONSULTANT AND ANY TEST RESULTS WILL BE SENT TO YOUR REFERRING PHYSICIAN.  You were seen today by [name] PA-C.  Increase your Gabapentin to 1200mg  every night. Labs drawn today, will call with results. Return in 3 months for labs and follow up. B12 injection given today, return monthly for B12 injection.  CT chest in 6 months,and CT of abdomen and pelvis in Sept of 2018.  Erik [name] is going to refer you to Dr. Oneida Alar for a colonoscopy.   Thank you for choosing [place] at [facility] to provide your oncology and hematology care.  To afford each patient quality time with our provider, please arrive at least 15 minutes before your scheduled appointment time.   Beginning January 23rd 2017 lab work for the [facility] will be done in the  Main lab at [facility] on 1st floor. If you have a lab appointment with the [facility] please come in thru the  Main Entrance and check in at the main information desk  You need to re-schedule your appointment should you arrive 10 or more minutes late.  We strive to give you quality time with our providers, and arriving late affects you and other patients whose appointments are after yours.  Also, if you no show three or more times for appointments you may be dismissed from the clinic at the providers discretion.     Again, thank you for choosing [facility].  Our hope is that these requests will decrease the amount of time that you wait before being seen by our physicians.       _____________________________________________________________  Should you have questions after your visit to [facility], please contact our office at [phone] between the hours of 8:30 a.m. and 4:30 p.m.  Voicemails left after 4:30 p.m. will not be returned until the following business day.  For prescription refill requests,  have your pharmacy contact our office.         Resources For Cancer Patients and their Caregivers ? American Cancer Society: Can assist with transportation, wigs, general needs, runs Look Good Feel Better.        7194050137 ? Cancer Care: Provides financial assistance, online support groups, medication/co-pay assistance.  1-800-813-HOPE 805-189-6312) ? [facility] Assists [place] cancer patients and their families through emotional , educational and financial support.  503-126-2289 ? [place] DSS Where to apply for food stamps, Medicaid and utility assistance. (782)679-6996 ? [facility]: Transportation to medical appointments. 458-420-4185 ? Social Security Administration: May apply for disability if have a Stage IV cancer. 782-168-5169 334-878-4101 ? [employer], Disability and Transit Services: Assists with nutrition, care and transit needs. [place] Support Programs: @10RELATIVEDAYS @ > Cancer Support Group  2nd Tuesday of the month 1pm-2pm, Journey Room  > Creative Journey  3rd Tuesday of the month 1130am-1pm, Journey Room  > Look Good Feel Better  1st Wednesday of the month 10am-12 noon, Journey Room (Call [facility] to register [phone])

## 2015-12-15 NOTE — Assessment & Plan Note (Addendum)
Stage IIIC [facility]) adenocarcinoma of transverse colon, S/P definitive surgery with a right extended hemicolectomy by Dr. [name].  Now on systemic adjuvant therapy consisting of FOLFOX.  Oncology history is updated.  Labs today: CBC diff, CMET, CEA.  I personally reviewed and went over laboratory results with the patient.  The results are noted within this dictation.  Labs in 3 months: CBC diff, CMET, CEA, ferritin.  I personally reviewed and went over radiographic studies with the patient.  The results are noted within this dictation.  I reviewed his CT CAP from 10/20/2015.  This test confirmed remission of disease.  We will repeat CT abd/pelvis w contrast in 12 months (sooner if indicated).  Given his pulmonary nodule, will repeat CT of chest wo contrast in 6 months to confirm stability.  Will refer to Dr. Oneida Alar, GI, for colonoscopy to be completed within 1 year of finishing treatment.   He continues to have ongoing issues with peripheral neuropathy, secondary to oxaliplatin.  It is grade 2.  Oxaliplatin was discontinued for his last cycle at which time 5FU/Leucovorin was given only for cycle #12.  He is provided education regarding oxaliplatin-induced peripheral neuropathy.  I have increased his Gabapentin to 1200 mg PO at HS.  Return in 12 weeks for follow-up.

## 2015-12-16 LAB — CEA: CEA: 2.1 ng/mL (ref 0.0–4.7)

## 2015-12-17 ENCOUNTER — Encounter: Payer: Self-pay | Admitting: Neurology

## 2015-12-17 ENCOUNTER — Ambulatory Visit (INDEPENDENT_AMBULATORY_CARE_PROVIDER_SITE_OTHER): Payer: Medicaid Other | Admitting: Neurology

## 2015-12-17 VITALS — BP 134/74 | HR 101 | Ht 74.0 in | Wt 226.7 lb

## 2015-12-17 DIAGNOSIS — F17200 Nicotine dependence, unspecified, uncomplicated: Secondary | ICD-10-CM | POA: Diagnosis not present

## 2015-12-17 DIAGNOSIS — G44219 Episodic tension-type headache, not intractable: Secondary | ICD-10-CM

## 2015-12-17 DIAGNOSIS — Z87898 Personal history of other specified conditions: Secondary | ICD-10-CM | POA: Diagnosis not present

## 2015-12-17 DIAGNOSIS — T451X5A Adverse effect of antineoplastic and immunosuppressive drugs, initial encounter: Secondary | ICD-10-CM

## 2015-12-17 DIAGNOSIS — G620 Drug-induced polyneuropathy: Secondary | ICD-10-CM | POA: Diagnosis not present

## 2015-12-17 DIAGNOSIS — E538 Deficiency of other specified B group vitamins: Secondary | ICD-10-CM

## 2015-12-17 DIAGNOSIS — F1011 Alcohol abuse, in remission: Secondary | ICD-10-CM

## 2015-12-17 DIAGNOSIS — R296 Repeated falls: Secondary | ICD-10-CM

## 2015-12-17 NOTE — Progress Notes (Signed)
NEUROLOGY FOLLOW UP OFFICE NOTE  Erik [name] [id number]  HISTORY OF PRESENT ILLNESS: Erik [name] is a 60 year old man with history of HTN, hyperlipidemia, adenocarcinoma of the colon,  PTSD, alcohol abuse, depression and history of SDH who presents for follow-up regarding tension type headache.   Recent history obtained by patient and oncology notes.  Imaging of CTA of abdomen reviewed.   UPDATE: He takes amitriptyline 10m at bedtime for tension-type headaches.  Headaches are well controlled.  He is most concerned about unsteady gait and frequent falls.  He underwent chemotherapy, 12 rounds of FOLFOX from 03/30/15 to 09/06/15.  During that time, he developed increased numbness in the hands and feet.  He was started on gabapentin for discomfort and neuropathic pain.  He has a cane but still frequently falls.  His last fall was yesterday.  In January, his B12 level was in the 140s, so he was started on B12 injections.    HISTORY: In 2014, he was on the porch with a friend.  He stood up and felt lightheaded, and then passed out.  When he woke up, the paramedics were there.  He did not have any convulsions, tongue laceration or incontinence.  He elected not to go to the hospital.  Later that night, he began to feel shaky with cold sweat.  He presented a couple of days later to an outside ED, where CT of brain revealed small amount of subarachnoid blood at the high left parietal lobe, as well as small 3 mm adjacent subdural hematoma without mass effect.  There was also a non-displaced left parietal skull fracture.  [facility]  Serum etoh level was 217.  He was evaluated by neurosurgery.  Follow up CT scan was stable, so no surgical intervention was indicated.  EEG from 08/27/12 was normal.  2D Echo revealed EF 55-60% with no significant abnormalities.  Black outs were thought to be secondary to postural hypotension, rather than seizure.     He has periodic dizzy  spells of lightheadedness and feeling of hot flashes or chills.  Sometimes they are associated with passing out for a few seconds.  He has no convulsions, tongue biting, urinary incontinence or postictal confusion.  It usually occurs soon after getting up.  He has fluctuation in blood pressure.  At one point, he was orthostatic but he sometimes has elevated blood pressure.  He had a cardiac monitor which was unremarkable.     He has history of alcohol abuse but only moderately drinks from time to time.   PAST MEDICAL HISTORY: Past Medical History:  Diagnosis Date  . Abnormal stress echocardiogram   . [place]  . Alcohol abuse    Heavy Use up until 2010  . Anxiety   . Depression   . GERD (gastroesophageal reflux disease)   . Head trauma 2001   closed head injury; coma for 4 weeks  . Hypercholesterolemia   . Hypertension   . PTSD (post-traumatic stress disorder)     MEDICATIONS: Current Outpatient Prescriptions on File Prior to Visit  Medication Sig Dispense Refill  . albuterol (PROVENTIL HFA;VENTOLIN HFA) 108 (90 BASE) MCG/ACT inhaler Inhale 2 puffs into the lungs every 6 (six) hours as needed for wheezing or shortness of breath. 1 Inhaler 1  . amitriptyline (ELAVIL) 25 MG tablet TAKE 1 TABLET BY MOUTH EVERY NIGHT AT BEDTIME 30 tablet 2  . atorvastatin (LIPITOR) 20 MG tablet Take 1 tablet (  20 mg total) by mouth daily. 90 tablet 1  . Cyanocobalamin (B-12 COMPLIANCE INJECTION) 1000 MCG/ML KIT Inject as directed.    Marland Kitchen dexlansoprazole (DEXILANT) 60 MG capsule Take 1 capsule (60 mg total) by mouth daily. 90 capsule 1  . FLUoxetine (PROZAC) 20 MG capsule Take 20 mg by mouth 2 (two) times daily. Reported on 07/29/2015    . gabapentin (NEURONTIN) 300 MG capsule Take 4 capsules (1,200 mg total) by mouth at bedtime. 120 capsule 1  . HYDROcodone-acetaminophen (NORCO) 5-325 MG tablet Take 1 tablet by mouth every 6 (six) hours as needed for moderate pain. 60  tablet 0  . loperamide (IMODIUM) 2 MG capsule TAKE 1 TO 2 CAPSULES BY MOUTH EVERY 6 HOURS AS NEEDED FOR DIARRHEA OR LOOSE STOOLS 60 capsule 0   Current Facility-Administered Medications on File Prior to Visit  Medication Dose Route Frequency Provider Last Rate Last Dose  . sodium chloride flush (NS) 0.9 % injection 10 mL  10 mL Intracatheter PRN Patrici Ranks, MD   10 mL at 08/23/15 1030    ALLERGIES: No Known Allergies  FAMILY HISTORY: Family History  Problem Relation Age of Onset  . Pulmonary embolism Mother   . Breast cancer Mother   . Cancer Mother   . Heart disease Father   . Cancer Father 67    Leukemia  . Cancer Paternal Grandfather     Lung  . Ataxia Neg Hx   . Chorea Neg Hx   . Dementia Neg Hx   . Mental retardation Neg Hx   . Migraines Neg Hx   . Multiple sclerosis Neg Hx   . Neurofibromatosis Neg Hx   . Neuropathy Neg Hx   . Parkinsonism Neg Hx   . Seizures Neg Hx   . Stroke Neg Hx   . Colon cancer Neg Hx     SOCIAL HISTORY: Social History   Social History  . Marital status: Divorced    Spouse name: N/A  . Number of children: N/A  . Years of education: N/A   Occupational History  . Full time    Social History Main Topics  . Smoking status: Current Some Day Smoker    Packs/day: 0.10    Types: Cigarettes  . Smokeless tobacco: Never Used     Comment: has not had a cigarette since 09-18-15  . Alcohol use 0.0 oz/week     Comment: beer occ, history of ETOH abuse in remote past.   . Drug use: No     Comment: marijuana last 2012  . Sexual activity: Yes    Partners: Female   Other Topics Concern  . Not on file   Social History Narrative   Single    REVIEW OF SYSTEMS: Constitutional: No fevers, chills, or sweats, no generalized fatigue, change in appetite Eyes: No visual changes, double vision, eye pain Ear, nose and throat: No hearing loss, ear pain, nasal congestion, sore throat Cardiovascular: No chest pain, palpitations Respiratory:  No  shortness of breath at rest or with exertion, wheezes GastrointestinaI: No nausea, vomiting, diarrhea, abdominal pain, fecal incontinence Genitourinary:  No dysuria, urinary retention or frequency Musculoskeletal:  No neck pain, back pain Integumentary: No rash, pruritus, skin lesions Neurological: as above Psychiatric: No depression, insomnia, anxiety Endocrine: No palpitations, fatigue, diaphoresis, mood swings, change in appetite, change in weight, increased thirst Hematologic/Lymphatic:  No purpura, petechiae. Allergic/Immunologic: no itchy/runny eyes, nasal congestion, recent allergic reactions, rashes  PHYSICAL EXAM: Vitals:   12/17/15 1346  BP: 134/74  Pulse: (!) 101   General: No acute distress.  Patient appears well-groomed.  normal body habitus. Head:  Normocephalic/atraumatic Eyes:  Fundi examined but not visualized Neck: supple, no paraspinal tenderness, full range of motion Heart:  Regular rate and rhythm Lungs:  Clear to auscultation bilaterally Back: No paraspinal tenderness Neurological Exam: alert and oriented to person, place, and time. Attention span and concentration intact, recent and remote memory intact, fund of knowledge intact.  Speech fluent and not dysarthric, language intact.  CN II-XII intact. Bulk and tone normal, muscle strength 5/5 throughout.  Sensation to pinprick sensation reduced in fingers as well as toes up to calves, and decreased vibration sensation to fingers and toes up to below knees.  Deep tendon reflexes trace throughout.  Finger to nose testing intact.  Ataxic.  Romberg positive.  IMPRESSION: Tension-type headache improved Chemotherapy-induced peripheral neuropathy with frequent falls.  B12 deficiency Tobacco use  PLAN: 1.  Amitriptyline 33m at bedtime. 2.  Will try to get Home Health PT to address gait and balance issues in order to reduce falls.  Use cane. 3.  Smoking cessation 4.  Follow up in one year or as needed.  25 minutes  spent face to face with patient, over 50% spent counseling.  Erik Clines DO  CC:  SSoyla Dryer PA-C

## 2015-12-17 NOTE — Patient Instructions (Addendum)
1.  Take amitriptyline 25mg  at bedtime to help prevent headache 2.  We will refer you for physical therapy for balance 3.  Follow up in one year

## 2015-12-17 NOTE — Addendum Note (Signed)
Addended by: Gerda Diss A on: 12/17/2015 02:43 PM   Modules accepted: Orders

## 2015-12-20 ENCOUNTER — Encounter: Payer: Self-pay | Admitting: Physician Assistant

## 2015-12-20 ENCOUNTER — Ambulatory Visit: Payer: Self-pay | Admitting: Physician Assistant

## 2015-12-20 VITALS — BP 106/68 | HR 94 | Temp 97.7°F | Ht 74.0 in | Wt 221.8 lb

## 2015-12-20 DIAGNOSIS — J449 Chronic obstructive pulmonary disease, unspecified: Secondary | ICD-10-CM

## 2015-12-20 DIAGNOSIS — F32A Depression, unspecified: Secondary | ICD-10-CM

## 2015-12-20 DIAGNOSIS — F17219 Nicotine dependence, cigarettes, with unspecified nicotine-induced disorders: Secondary | ICD-10-CM

## 2015-12-20 DIAGNOSIS — F329 Major depressive disorder, single episode, unspecified: Secondary | ICD-10-CM

## 2015-12-20 DIAGNOSIS — C184 Malignant neoplasm of transverse colon: Secondary | ICD-10-CM

## 2015-12-20 DIAGNOSIS — E785 Hyperlipidemia, unspecified: Secondary | ICD-10-CM

## 2015-12-20 NOTE — Patient Instructions (Signed)
Call [facility] at  [phone] to make sure appointment made.

## 2015-12-20 NOTE — Progress Notes (Signed)
BP 106/68 (BP Location: Left Arm, Patient Position: Sitting, Cuff Size: Normal)   Pulse 94   Temp 97.7 F (36.5 C) (Other (Comment))   Ht '6\' 2"'  (1.88 m)   Wt 221 lb 12.8 oz (100.6 kg)   SpO2 98%   BMI 28.48 kg/m    Subjective:    Patient ID: Erik [name], male    DOB: 1955-03-11, 60 y.o.   MRN: [id number]  HPI: JAIVION KINGSLEY is a 60 y.o. male presenting on 12/20/2015 for Hyperlipidemia   HPI   Pt saw neurology last week.  PT referral made. Pt saw oncology last week.  Pt referred to GI for colonoscopy.  Pt having hard time getting around.  He is really tired feeling.  He is having hard time walking.  He says mood is not so good.  He had stopped going to counseling at [facility] some time ago but says that someone is supposed to be getting him set up appt again (to see Charlottte, his therapist)  Relevant past medical, surgical, family and social history reviewed and updated as indicated. Interim medical history since our last visit reviewed. Allergies and medications reviewed and updated.   Current Outpatient Prescriptions:  .  albuterol (PROVENTIL HFA;VENTOLIN HFA) 108 (90 BASE) MCG/ACT inhaler, Inhale 2 puffs into the lungs every 6 (six) hours as needed for wheezing or shortness of breath., Disp: 1 Inhaler, Rfl: 1 .  amitriptyline (ELAVIL) 25 MG tablet, TAKE 1 TABLET BY MOUTH EVERY NIGHT AT BEDTIME (Patient taking differently: TAKE 1 TABLET BY MOUTH EVERY NIGHT AT BEDTIME prn), Disp: 30 tablet, Rfl: 2 .  atorvastatin (LIPITOR) 20 MG tablet, Take 1 tablet (20 mg total) by mouth daily., Disp: 90 tablet, Rfl: 1 .  Cyanocobalamin (B-12 COMPLIANCE INJECTION) 1000 MCG/ML KIT, Inject as directed., Disp: , Rfl:  .  dexlansoprazole (DEXILANT) 60 MG capsule, Take 1 capsule (60 mg total) by mouth daily., Disp: 90 capsule, Rfl: 1 .  FLUoxetine (PROZAC) 20 MG capsule, Take 20 mg by mouth 2 (two) times daily. Reported on 07/29/2015, Disp: , Rfl:  .  gabapentin (NEURONTIN) 300 MG  capsule, Take 4 capsules (1,200 mg total) by mouth at bedtime. (Patient taking differently: Take by mouth at bedtime. ), Disp: 120 capsule, Rfl: 1 .  HYDROcodone-acetaminophen (NORCO) 5-325 MG tablet, Take 1 tablet by mouth every 6 (six) hours as needed for moderate pain., Disp: 60 tablet, Rfl: 0 .  loperamide (IMODIUM) 2 MG capsule, TAKE 1 TO 2 CAPSULES BY MOUTH EVERY 6 HOURS AS NEEDED FOR DIARRHEA OR LOOSE STOOLS, Disp: 60 capsule, Rfl: 0 No current facility-administered medications for this visit.   Facility-Administered Medications Ordered in Other Visits:  .  sodium chloride flush (NS) 0.9 % injection 10 mL, 10 mL, Intracatheter, PRN, Patrici Ranks, MD, 10 mL at 08/23/15 1030   Review of Systems  Constitutional: Positive for fatigue. Negative for appetite change, chills, diaphoresis, fever and unexpected weight change.  HENT: Negative for congestion, dental problem, drooling, ear pain, facial swelling, hearing loss, mouth sores, sneezing, sore throat, trouble swallowing and voice change.   Eyes: Positive for visual disturbance. Negative for pain, discharge, redness and itching.  Respiratory: Positive for shortness of breath. Negative for cough, choking and wheezing.   Cardiovascular: Negative for chest pain, palpitations and leg swelling.  Gastrointestinal: Positive for diarrhea. Negative for abdominal pain, blood in stool, constipation and vomiting.  Endocrine: Positive for cold intolerance and polydipsia. Negative for heat intolerance.  Genitourinary: Negative for  decreased urine volume, dysuria and hematuria.  Musculoskeletal: Positive for arthralgias and gait problem. Negative for back pain.  Skin: Negative for rash.  Allergic/Immunologic: Negative for environmental allergies.  Neurological: Positive for light-headedness. Negative for seizures and headaches.  Hematological: Negative for adenopathy.  Psychiatric/Behavioral: Positive for agitation and dysphoric mood. Negative for  suicidal ideas. The patient is nervous/anxious.     Per HPI unless specifically indicated above     Objective:    BP 106/68 (BP Location: Left Arm, Patient Position: Sitting, Cuff Size: Normal)   Pulse 94   Temp 97.7 F (36.5 C) (Other (Comment))   Ht '6\' 2"'  (1.88 m)   Wt 221 lb 12.8 oz (100.6 kg)   SpO2 98%   BMI 28.48 kg/m   Wt Readings from Last 3 Encounters:  12/20/15 221 lb 12.8 oz (100.6 kg)  12/17/15 226 lb 11.2 oz (102.8 kg)  12/15/15 225 lb 14.4 oz (102.5 kg)    Physical Exam  Constitutional: He is oriented to person, place, and time. He appears well-developed and well-nourished.  HENT:  Head: Normocephalic and atraumatic.  Neck: Neck supple.  Cardiovascular: Normal rate and regular rhythm.   Pulmonary/Chest: Effort normal and breath sounds normal. He has no wheezes.  Abdominal: Soft. Bowel sounds are normal. There is no hepatosplenomegaly. There is no tenderness.  Musculoskeletal: He exhibits no edema.  Lymphadenopathy:    He has no cervical adenopathy.  Neurological: He is alert and oriented to person, place, and time.  Skin: Skin is warm and dry.  Psychiatric: He has a normal mood and affect. His behavior is normal.  Vitals reviewed.   Results for orders placed or performed in visit on 12/15/15  CBC with Differential  Result Value Ref Range   WBC 7.2 4.0 - 10.5 K/uL   RBC 4.48 4.22 - 5.81 MIL/uL   Hemoglobin 14.1 13.0 - 17.0 g/dL   HCT 41.7 39.0 - 52.0 %   MCV 93.1 78.0 - 100.0 fL   MCH 31.5 26.0 - 34.0 pg   MCHC 33.8 30.0 - 36.0 g/dL   RDW 12.5 11.5 - 15.5 %   Platelets 200 150 - 400 K/uL   Neutrophils Relative % 62 %   Neutro Abs 4.5 1.7 - 7.7 K/uL   Lymphocytes Relative 28 %   Lymphs Abs 2.0 0.7 - 4.0 K/uL   Monocytes Relative 7 %   Monocytes Absolute 0.5 0.1 - 1.0 K/uL   Eosinophils Relative 2 %   Eosinophils Absolute 0.2 0.0 - 0.7 K/uL   Basophils Relative 1 %   Basophils Absolute 0.0 0.0 - 0.1 K/uL  Comprehensive metabolic panel  Result  Value Ref Range   Sodium 138 135 - 145 mmol/L   Potassium 4.1 3.5 - 5.1 mmol/L   Chloride 104 101 - 111 mmol/L   CO2 28 22 - 32 mmol/L   Glucose, Bld 95 65 - 99 mg/dL   BUN 13 6 - 20 mg/dL   Creatinine, Ser 0.79 0.61 - 1.24 mg/dL   Calcium 9.3 8.9 - 10.3 mg/dL   Total Protein 7.6 6.5 - 8.1 g/dL   Albumin 4.1 3.5 - 5.0 g/dL   AST 18 15 - 41 U/L   ALT 16 (L) 17 - 63 U/L   Alkaline Phosphatase 82 38 - 126 U/L   Total Bilirubin 0.6 0.3 - 1.2 mg/dL   GFR calc non Af Amer >60 >60 mL/min   GFR calc Af Amer >60 >60 mL/min   Anion gap 6 5 -  15  CEA  Result Value Ref Range   CEA 2.1 0.0 - 4.7 ng/mL      Assessment & Plan:    Encounter Diagnoses  Name Primary?  . Hyperlipidemia, unspecified hyperlipidemia type Yes  . Cigarette nicotine dependence with nicotine-induced disorder   . Chronic obstructive pulmonary disease, unspecified COPD type ([place])   . Adenocarcinoma of transverse colon ([place])   . Depression, unspecified depression type     -Check fasting lipids this week..  Will call with results -recommended pt call [facility] to check on his appointment.  He is given the phone number -continue current medications -counseled pt on smoking cessation -Physical Therapy has already been ordered for pt -follow up 3 months.  RTO sooner prn

## 2015-12-21 LAB — LIPID PANEL
Cholesterol: 123 mg/dL (ref <200)
HDL: 45 mg/dL (ref >40)
LDL CALC: 56 mg/dL (ref <100)
Total CHOL/HDL Ratio: 2.7 Ratio (ref <5.0)
Triglycerides: 111 mg/dL (ref <150)
VLDL: 22 mg/dL (ref <30)

## 2015-12-27 ENCOUNTER — Other Ambulatory Visit (HOSPITAL_COMMUNITY): Payer: Self-pay | Admitting: Oncology

## 2015-12-27 ENCOUNTER — Other Ambulatory Visit: Payer: Self-pay | Admitting: Physician Assistant

## 2015-12-27 DIAGNOSIS — C184 Malignant neoplasm of transverse colon: Secondary | ICD-10-CM

## 2015-12-27 MED ORDER — FLUOXETINE HCL 20 MG PO CAPS: 20.0000 mg | ORAL_CAPSULE | Freq: Two times a day (BID) | ORAL | 1 refills | Status: DC

## 2016-01-06 ENCOUNTER — Ambulatory Visit (INDEPENDENT_AMBULATORY_CARE_PROVIDER_SITE_OTHER): Payer: Medicaid Other | Admitting: Gastroenterology

## 2016-01-06 ENCOUNTER — Other Ambulatory Visit: Payer: Self-pay

## 2016-01-06 ENCOUNTER — Encounter: Payer: Self-pay | Admitting: Gastroenterology

## 2016-01-06 VITALS — BP 138/74 | HR 91 | Temp 97.4°F | Ht 74.0 in | Wt 236.0 lb

## 2016-01-06 DIAGNOSIS — C184 Malignant neoplasm of transverse colon: Secondary | ICD-10-CM | POA: Diagnosis not present

## 2016-01-06 DIAGNOSIS — Z85038 Personal history of other malignant neoplasm of large intestine: Secondary | ICD-10-CM

## 2016-01-06 DIAGNOSIS — R197 Diarrhea, unspecified: Secondary | ICD-10-CM

## 2016-01-06 MED ORDER — PEG 3350-KCL-NA BICARB-NACL 420 G PO SOLR: 4000.0000 mL | ORAL | 0 refills | Status: DC

## 2016-01-06 NOTE — Assessment & Plan Note (Addendum)
60 year old gentleman diagnosed with stage IIIc adenocarcinoma of the transverse colon in December 2016, status post definitive surgery with right extended hemicolectomy and systemic adjuvant therapy.  Patient presents for one-year surveillance colonoscopy. Clinically he is doing well with the exception of postop diarrhea which is been persistent but manageable with Imodium 4 mg every morning. Plan on a colonoscopy in the near future with deep sedation given polypharmacy/prior alcohol abuse history. I have discussed the risks, alternatives, benefits with regards to but not limited to the risk of reaction to medication, bleeding, infection, perforation and the patient is agreeable to proceed. Written consent to be obtained.  I personally reviewed 10/2015 CT Abd with Dr. Thornton Papas regarding gastric findings. No concern for gastric abnormalities, findings felt to be related to ingested contrast and food debris.

## 2016-01-06 NOTE — Patient Instructions (Signed)
1. Colonoscopy as scheduled. See separate instructions.  

## 2016-01-06 NOTE — Progress Notes (Signed)
Primary Care Physician:  [name], PA-C  Primary Gastroenterologist:  Barney Drain, MD   Chief Complaint  Patient presents with  . Colonoscopy    HPI:  Erik [name] is a 60 y.o. male here for follow up. He Has history of stage IIIc adenocarcinoma of the transverse colon (diagnosed at time of surveillance colonoscopy December 2016), status post definitive surgery with right extended hemicolectomy by Dr. [name]. Received systemic adjuvant therapy consisting of FOLFOX.   Patient is due for one year surveillance colonoscopy. Unfortunately he developed neuropathy related to his chemotherapy. Clinically overall he states he continues to feel better. He lost his appetite quite a bit with chemotherapy and now is accustomed to just eating several small portion meals daily. "Makes me sick to overeat." He has had diarrhea as ever since his surgery. Can have up to 5 loose stools daily but he controls by taking 2 Imodium every morning. Denies any abdominal pain. No blood in the stool or melena. Denies significant weight loss. Heartburn every now and then. Usually has it first in the morning when he wakes up when he does have it. But much better than it was prior to his colon surgery.  Patient had a CT of the chest, abdomen and pelvis with contrast on 10/20/2015. Probable food debris in the gastric body, simulating wall thickening. Subcentimeter low-attenuation lesion in the peripheral right hepatic lobe unchanged. Status post right hemicolectomy without evidence of metastatic disease. Labs as outlined below. Patient had EGD in 01/2015.   Current Outpatient Prescriptions  Medication Sig Dispense Refill  . albuterol (PROVENTIL HFA;VENTOLIN HFA) 108 (90 BASE) MCG/ACT inhaler Inhale 2 puffs into the lungs every 6 (six) hours as needed for wheezing or shortness of breath. 1 Inhaler 1  . amitriptyline (ELAVIL) 25 MG tablet TAKE 1 TABLET BY MOUTH EVERY NIGHT AT BEDTIME (Patient taking differently:  TAKE 1 TABLET BY MOUTH EVERY NIGHT AT BEDTIME prn) 30 tablet 2  . atorvastatin (LIPITOR) 20 MG tablet Take 1 tablet (20 mg total) by mouth daily. 90 tablet 1  . Cyanocobalamin (B-12 COMPLIANCE INJECTION) 1000 MCG/ML KIT Inject as directed.    Marland Kitchen dexlansoprazole (DEXILANT) 60 MG capsule Take 1 capsule (60 mg total) by mouth daily. 90 capsule 1  . FLUoxetine (PROZAC) 20 MG capsule Take 1 capsule (20 mg total) by mouth 2 (two) times daily. 60 capsule 1  . gabapentin (NEURONTIN) 300 MG capsule Take 4 capsules (1,200 mg total) by mouth at bedtime. (Patient taking differently: Take by mouth at bedtime. ) 120 capsule 1  . HYDROcodone-acetaminophen (NORCO) 5-325 MG tablet Take 1 tablet by mouth every 6 (six) hours as needed for moderate pain. 60 tablet 0  . loperamide (IMODIUM) 2 MG capsule TAKE 1 TO 2 CAPSULES BY MOUTH EVERY 6 HOURS AS NEEDED FOR DIARRHEA OR LOOSE STOOLS. 60 capsule 1   No current facility-administered medications for this visit.    Facility-Administered Medications Ordered in Other Visits  Medication Dose Route Frequency Provider Last Rate Last Dose  . sodium chloride flush (NS) 0.9 % injection 10 mL  10 mL Intracatheter PRN Patrici Ranks, MD   10 mL at 08/23/15 1030    Allergies as of 01/06/2016  . (No Known Allergies)    Past Medical History:  Diagnosis Date  . Abnormal stress echocardiogram   . Adenocarcinoma of transverse colon ([place]) 02/10/2015  . Alcohol abuse    Heavy Use up until 2010  . Anxiety   . Depression   .  GERD (gastroesophageal reflux disease)   . Head trauma 2001   closed head injury; coma for 4 weeks  . Hypercholesterolemia   . Hypertension   . PTSD (post-traumatic stress disorder)     Past Surgical History:  Procedure Laterality Date  . cardiac cath    . COLONOSCOPY  2011   Dr. Oneida Alar: multiple adenomas and hyperplastic polyps  . COLONOSCOPY WITH PROPOFOL N/A 01/12/2015   Procedure: COLONOSCOPY WITH PROPOFOL;  Surgeon: Danie Binder, MD;   [facility];  Service: Endoscopy;  Laterality: N/A;  1030  . CRANIOTOMY  2001  . ESOPHAGOGASTRODUODENOSCOPY (EGD) WITH PROPOFOL N/A 01/12/2015   Procedure: ESOPHAGOGASTRODUODENOSCOPY (EGD) WITH PROPOFOL;  Surgeon: Danie Binder, MD;  [facility]: [facility];  Service: Endoscopy;  Laterality: N/A;  . head injury surgery    . HERNIA REPAIR Right 2012   Inguinal- Forest[place] Na  . KIDNEY SURGERY     >30 years ago  . LAPAROSCOPIC RIGHT HEMI COLECTOMY Left 02/10/2015   Procedure: LAPAROSCOPIC THEN OPEN RIGHT HEMI COLECTOMY;  Surgeon: [name], MD;  Location: [facility];  Service: General;  Laterality: Left;  . PORTACATH PLACEMENT N/A 03/24/2015   Procedure: INSERTION PORT-A-CATH;  Surgeon: Jules Husbands, MD;  Location: [facility];  Service: General;  Laterality: N/A;    Family History  Problem Relation Age of Onset  . Pulmonary embolism Mother   . Breast cancer Mother   . Cancer Mother   . Heart disease Father   . Cancer Father 66    Leukemia  . Cancer Paternal Grandfather     Lung  . Ataxia Neg Hx   . Chorea Neg Hx   . Dementia Neg Hx   . Mental retardation Neg Hx   . Migraines Neg Hx   . Multiple sclerosis Neg Hx   . Neurofibromatosis Neg Hx   . Neuropathy Neg Hx   . Parkinsonism Neg Hx   . Seizures Neg Hx   . Stroke Neg Hx   . Colon cancer Neg Hx     Social History   Social History  . Marital status: Divorced    Spouse name: N/A  . Number of children: N/A  . Years of education: N/A   Occupational History  . Full time    Social History Main Topics  . Smoking status: Current Some Day Smoker    Packs/day: 0.10    Types: Cigarettes  . Smokeless tobacco: Never Used     Comment: smokes every 2-3 days, 2-3 a day when smokes  . Alcohol use 0.0 oz/week     Comment: beer occ, history of ETOH abuse in remote past.   . Drug use: No     Comment: marijuana last 2012  . Sexual activity: Yes    Partners: Female   Other Topics Concern  . Not on file   Social  History Narrative   Single      ROS:  General: Negative for anorexia, weight loss, fever, chills, fatigue, weakness. Eyes: Negative for vision changes.  ENT: Negative for hoarseness, difficulty swallowing , nasal congestion. CV: Negative for chest pain, angina, palpitations, dyspnea on exertion, peripheral edema.  Respiratory: Negative for dyspnea at rest, dyspnea on exertion, cough, sputum, wheezing.  GI: See history of present illness. GU:  Negative for dysuria, hematuria, urinary incontinence, urinary frequency, nocturnal urination.  MS: Negative for joint pain, low back pain.  Derm: Negative for rash or itching.  Neuro: Negative for weakness, abnormal sensation, seizure, frequent headaches,  memory loss, confusion. +peripheral neuropathy.  Psych: Negative for anxiety, depression, suicidal ideation, hallucinations.  Endo: Negative for unusual weight change.  Heme: Negative for bruising or bleeding. Allergy: Negative for rash or hives.    Physical Examination:  BP 138/74   Pulse 91   Temp 97.4 F (36.3 C) (Oral)   Ht _0  (1.88 m)   Wt 236 lb (107 kg)   BMI 30.30 kg/m    General: Well-nourished, well-developed in no acute distress.  Head: Normocephalic, atraumatic.   Eyes: Conjunctiva pink, no icterus. Mouth: Oropharyngeal mucosa moist and pink , no lesions erythema or exudate. Neck: Supple without thyromegaly, masses, or lymphadenopathy.  Lungs: Clear to auscultation bilaterally.  Heart: Regular rate and rhythm, no murmurs rubs or gallops.  Abdomen: Bowel sounds are normal, vague centralized abd tenderness especially around midline incision, nondistended, no hepatosplenomegaly or masses, no abdominal bruits or    hernia , no rebound or guarding.   Rectal: not performed Extremities: No lower extremity edema. No clubbing or deformities.  Neuro: Alert and oriented x 4 , grossly normal neurologically.  Skin: Warm and dry, no rash or jaundice.   Psych: Alert and  cooperative, normal mood and affect.  Labs: Lab Results  Component Value Date   CEA 2.1 12/15/2015   Lab Results  Component Value Date   CREATININE 0.79 12/15/2015   BUN 13 12/15/2015   NA 138 12/15/2015   K 4.1 12/15/2015   CL 104 12/15/2015   CO2 28 12/15/2015   Lab Results  Component Value Date   ALT 16 (L) 12/15/2015   AST 18 12/15/2015   ALKPHOS 82 12/15/2015   BILITOT 0.6 12/15/2015   Lab Results  Component Value Date   WBC 7.2 12/15/2015   HGB 14.1 12/15/2015   HCT 41.7 12/15/2015   MCV 93.1 12/15/2015   PLT 200 12/15/2015   Lab Results  Component Value Date   FERRITIN 52 03/09/2015     Imaging Studies: No results found.

## 2016-01-06 NOTE — Progress Notes (Signed)
cc'ed to pcp °

## 2016-01-14 ENCOUNTER — Ambulatory Visit (HOSPITAL_COMMUNITY): Payer: Medicaid Other

## 2016-01-18 NOTE — Congregational Nurse Program (Signed)
Congregational Nurse Program Note  Date of Encounter: 01/18/2016  Past Medical History: Past Medical History:  Diagnosis Date  . Abnormal stress echocardiogram   . Adenocarcinoma of transverse colon ([place]) 02/10/2015  . Alcohol abuse    Heavy Use up until 2010  . Anxiety   . Depression   . GERD (gastroesophageal reflux disease)   . Head trauma 2001   closed head injury; coma for 4 weeks  . Hypercholesterolemia   . Hypertension   . PTSD (post-traumatic stress disorder)     Encounter Details:     CNP Questionnaire - 01/18/16 1130      Patient Demographics   Is this a new or existing patient? Existing   Patient is considered a/an Not Applicable   Race Caucasian/White     Patient Assistance   Location of Patient Assistance [employer] [place]   Patient's financial/insurance status Low Income;Medicaid   Uninsured Patient ([place] [employer]s) No   Patient referred to apply for the following financial assistance Not Applicable   Food insecurities addressed Not Applicable   Transportation assistance No   Assistance securing medications Yes   Type of Assistance Other   Educational health offerings Not Applicable     Encounter Details   Primary purpose of visit Education/Health Concerns;Spiritual Care/Support Visit;Other   Was an Emergency Department visit averted? Not Applicable   Does patient have a medical provider? Yes   Patient referred to Not Applicable   Was a mental health screening completed? (GAINS tool) No   Does patient have dental issues? No   Does patient have vision issues? No   Does your patient have an abnormal blood pressure today? No   Since previous encounter, have you referred patient for abnormal blood pressure that resulted in a new diagnosis or medication change? No   Does your patient have an abnormal blood glucose today? No   Since previous encounter, have you referred patient for abnormal blood glucose that resulted in a new diagnosis  or medication change? No   Was there a life-saving intervention made? No     follow up with existing client post GI appointment as well as Mental Health appointment with [facility]. Client states he has an upcoming Colonoscopy scheduled on 01/25/16 for his one year follow up after diagnosis and treatment for Colon cancer.  Client states he needs assistance with obtaining bisacodyl needed for prep as well as basic needs items such as toilet paper, coins for laundry, detergent, soap,daily hygiene items.  Client with medicaid , but no income and still has not heard regarding his disability hearing he had in August of 2017. Client having difficulty obtaining basic needs items that are not covered with food stamps as well as co-pays for his medications and medical visits.  Client very discouraged. States he did go to his appointment with his counselor at [facility] and has an upcoming appointment with psychiatrist for medication review. RN will notify community members for possible assistance with some basic care need items and for transportation and assistance on the day of his colonoscopy. RN to contact client with any information regarding any assistance listed above. Client states understanding.

## 2016-01-19 ENCOUNTER — Encounter (HOSPITAL_COMMUNITY): Payer: Medicaid Other | Attending: Hematology & Oncology

## 2016-01-19 ENCOUNTER — Encounter (HOSPITAL_COMMUNITY): Payer: Self-pay

## 2016-01-19 VITALS — BP 162/93 | HR 91 | Temp 98.4°F | Resp 20

## 2016-01-19 DIAGNOSIS — C184 Malignant neoplasm of transverse colon: Secondary | ICD-10-CM | POA: Insufficient documentation

## 2016-01-19 DIAGNOSIS — E538 Deficiency of other specified B group vitamins: Secondary | ICD-10-CM

## 2016-01-19 MED ORDER — CYANOCOBALAMIN 1000 MCG/ML IJ SOLN
INTRAMUSCULAR | Status: AC
  Filled 2016-01-19: qty 1

## 2016-01-19 MED ORDER — CYANOCOBALAMIN 1000 MCG/ML IJ SOLN
1000.0000 ug | Freq: Once | INTRAMUSCULAR | Status: AC
  Administered 2016-01-19: 1000 ug via INTRAMUSCULAR

## 2016-01-19 NOTE — Patient Instructions (Signed)
[facility] at [facility] Discharge Instructions  RECOMMENDATIONS MADE BY THE CONSULTANT AND ANY TEST RESULTS WILL BE SENT TO YOUR REFERRING PHYSICIAN.  B12 injection today. Return as scheduled for injections. Return as scheduled for lab work and office visit.  Thank you for choosing [facility] at [facility] to provide your oncology and hematology care.  To afford each patient quality time with our provider, please arrive at least 15 minutes before your scheduled appointment time.   Beginning January 23rd 2017 lab work for the Cancer Center will be done in the  Main lab at [place] on 1st floor. If you have a lab appointment with the Cancer Center please come in thru the  Main Entrance and check in at the main information desk  You need to re-schedule your appointment should you arrive 10 or more minutes late.  We strive to give you quality time with our providers, and arriving late affects you and other patients whose appointments are after yours.  Also, if you no show three or more times for appointments you may be dismissed from the clinic at the providers discretion.     Again, thank you for choosing [facility].  Our hope is that these requests will decrease the amount of time that you wait before being seen by our physicians.       _____________________________________________________________  Should you have questions after your visit to [facility], please contact our office at [phone] between the hours of 8:30 a.m. and 4:30 p.m.  Voicemails left after 4:30 p.m. will not be returned until the following business day.  For prescription refill requests, have your pharmacy contact our office.         Resources For Cancer Patients and their Caregivers ? American Cancer Society: Can assist with transportation, wigs, general needs, runs Look Good Feel Better.        1-888-227-6333 ? Cancer  Care: Provides financial assistance, online support groups, medication/co-pay assistance.  1-800-813-HOPE (4673) ? [facility] Assists [place] cancer patients and their families through emotional , educational and financial support.  336-427-4357 ? [place] DSS Where to apply for food stamps, Medicaid and utility assistance. 336-342-1394 ? [facility]: Transportation to medical appointments. 336-347-2287 ? Social Security Administration: May apply for disability if have a Stage IV cancer. 336-342-7796 1-800-772-1213 ? [place] Aging, Disability and Transit Services: Assists with nutrition, care and transit needs. 336-349-2343  Cancer Center Support Programs: @10RELATIVEDAYS@ > Cancer Support Group  2nd Tuesday of the month 1pm-2pm, Journey Room  > Creative Journey  3rd Tuesday of the month 1130am-1pm, Journey Room  > Look Good Feel Better  1st Wednesday of the month 10am-12 noon, Journey Room (Call American Cancer Society to register 1-800-395-5775)   

## 2016-01-19 NOTE — Progress Notes (Signed)
Erik [name] presents today for injection per the provider's orders.  B12 administration without incident; see MAR for injection details.  Patient tolerated procedure well and without incident.  No questions or complaints noted at this time.

## 2016-01-19 NOTE — Patient Instructions (Signed)
Erik [name]  01/19/2016     @PREFPERIOPPHARMACY @   Your procedure is scheduled on  01/25/2016.  Report to [facility] at  [place]  A.M.  Call this number if you have problems the morning of surgery:  [phone]   Remember:  Do not eat food or drink liquids after midnight.  Take these medicines the morning of surgery with A SIP OF WATER dexilant, prozac, neurontin, hydrocodone.   Do not wear jewelry, make-up or nail polish.  Do not wear lotions, powders, or perfumes, or deoderant.  Do not shave 48 hours prior to surgery.  Men may shave face and neck.  Do not bring valuables to the hospital.  [facility] is not responsible for any belongings or valuables.  Contacts, dentures or bridgework may not be worn into surgery.  Leave your suitcase in the car.  After surgery it may be brought to your room.  For patients admitted to the hospital, discharge time will be determined by your treatment team.  Patients discharged the day of surgery will not be allowed to drive home.   Name and phone number of your driver:   family Special instructions:  Follow the diet and prep instructions given to you by Dr Nona Dell office.  Please read over the following fact sheets that you were given. Anesthesia Post-op Instructions and Care and Recovery After Surgery       Colonoscopy, Adult A colonoscopy is an exam to look at the entire large intestine. During the exam, a lubricated, bendable tube is inserted into the anus and then passed into the rectum, colon, and other parts of the large intestine. A colonoscopy is often done as a part of normal colorectal screening or in response to certain symptoms, such as anemia, persistent diarrhea, abdominal pain, and blood in the stool. The exam can help screen for and diagnose medical problems, including:  Tumors.  Polyps.  Inflammation.  Areas of bleeding. Tell a health care provider about:  Any allergies you have.  All medicines  you are taking, including vitamins, herbs, eye drops, creams, and over-the-counter medicines.  Any problems you or family members have had with anesthetic medicines.  Any blood disorders you have.  Any surgeries you have had.  Any medical conditions you have.  Any problems you have had passing stool. What are the risks? Generally, this is a safe procedure. However, problems may occur, including:  Bleeding.  A tear in the intestine.  A reaction to medicines given during the exam.  Infection (rare). What happens before the procedure? Eating and drinking restrictions  Follow instructions from your health care provider about eating and drinking, which may include:  A few days before the procedure - follow a low-fiber diet. Avoid nuts, seeds, dried fruit, raw fruits, and vegetables.  1-3 days before the procedure - follow a clear liquid diet. Drink only clear liquids, such as clear broth or bouillon, black coffee or tea, clear juice, clear soft drinks or sports drinks, gelatin desert, and popsicles. Avoid any liquids that contain red or purple dye.  On the day of the procedure - do not eat or drink anything during the 2 hours before the procedure, or within the time period that your health care provider recommends. Bowel prep  If you were prescribed an oral bowel prep to clean out your colon:  Take it as told by your health care provider. Starting the day before your procedure, you will need to  drink a large amount of medicated liquid. The liquid will cause you to have multiple loose stools until your stool is almost clear or light green.  If your skin or anus gets irritated from diarrhea, you may use these to relieve the irritation:  Medicated wipes, such as adult wet wipes with aloe and vitamin E.  A skin soothing-product like petroleum jelly.  If you vomit while drinking the bowel prep, take a break for up to 60 minutes and then begin the bowel prep again. If vomiting continues  and you cannot take the bowel prep without vomiting, call your health care provider. General instructions  Ask your health care provider about changing or stopping your regular medicines. This is especially important if you are taking diabetes medicines or blood thinners.  Plan to have someone take you home from the hospital or clinic. What happens during the procedure?  An IV tube may be inserted into one of your veins.  You will be given medicine to help you relax (sedative).  To reduce your risk of infection:  Your health care team will wash or sanitize their hands.  Your anal area will be washed with soap.  You will be asked to lie on your side with your knees bent.  Your health care provider will lubricate a long, thin, flexible tube. The tube will have a camera and a light on the end.  The tube will be inserted into your anus.  The tube will be gently eased through your rectum and colon.  Air will be delivered into your colon to keep it open. You may feel some pressure or cramping.  The camera will be used to take images during the procedure.  A small tissue sample may be removed from your body to be examined under a microscope (biopsy). If any potential problems are found, the tissue will be sent to a lab for testing.  If small polyps are found, your health care provider may remove them and have them checked for cancer cells.  The tube that was inserted into your anus will be slowly removed. The procedure may vary among health care providers and hospitals. What happens after the procedure?  Your blood pressure, heart rate, breathing rate, and blood oxygen level will be monitored until the medicines you were given have worn off.  Do not drive for 24 hours after the exam.  You may have a small amount of blood in your stool.  You may pass gas and have mild abdominal cramping or bloating due to the air that was used to inflate your colon during the exam.  It is up to  you to get the results of your procedure. Ask your health care provider, or the department performing the procedure, when your results will be ready. This information is not intended to replace advice given to you by your health care provider. Make sure you discuss any questions you have with your health care provider. Document Released: 01/21/2000 Document Revised: 08/13/2015 Document Reviewed: 04/06/2015 Elsevier Interactive Patient Education  2017 [employer]vier [employer].  Colonoscopy, Adult, Care After This sheet gives you information about how to care for yourself after your procedure. Your health care provider may also give you more specific instructions. If you have problems or questions, contact your health care provider. What can I expect after the procedure? After the procedure, it is common to have:  A small amount of blood in your stool for 24 hours after the procedure.  Some gas.  Mild abdominal  cramping or bloating. Follow these instructions at home: General instructions  For the first 24 hours after the procedure:  Do not drive or use machinery.  Do not sign important documents.  Do not drink alcohol.  Do your regular daily activities at a slower pace than normal.  Eat soft, easy-to-digest foods.  Rest often.  Take over-the-counter or prescription medicines only as told by your health care provider.  It is up to you to get the results of your procedure. Ask your health care provider, or the department performing the procedure, when your results will be ready. Relieving cramping and bloating  Try walking around when you have cramps or feel bloated.  Apply heat to your abdomen as told by your health care provider. Use a heat source that your health care provider recommends, such as a moist heat pack or a heating pad.  Place a towel between your skin and the heat source.  Leave the heat on for 20-30 minutes.  Remove the heat if your skin turns bright red. This is  especially important if you are unable to feel pain, heat, or cold. You may have a greater risk of getting burned. Eating and drinking  Drink enough fluid to keep your urine clear or pale yellow.  Resume your normal diet as instructed by your health care provider. Avoid heavy or fried foods that are hard to digest.  Avoid drinking alcohol for as long as instructed by your health care provider. Contact a health care provider if:  You have blood in your stool 2-3 days after the procedure. Get help right away if:  You have more than a small spotting of blood in your stool.  You pass large blood clots in your stool.  Your abdomen is swollen.  You have nausea or vomiting.  You have a fever.  You have increasing abdominal pain that is not relieved with medicine. This information is not intended to replace advice given to you by your health care provider. Make sure you discuss any questions you have with your health care provider. Document Released: 09/07/2003 Document Revised: 10/18/2015 Document Reviewed: 04/06/2015 Elsevier Interactive Patient Education  2017 Elsevier Inc. PATIENT INSTRUCTIONS POST-ANESTHESIA  IMMEDIATELY FOLLOWING SURGERY:  Do not drive or operate machinery for the first twenty four hours after surgery.  Do not make any important decisions for twenty four hours after surgery or while taking narcotic pain medications or sedatives.  If you develop intractable nausea and vomiting or a severe headache please notify your doctor immediately.  FOLLOW-UP:  Please make an appointment with your surgeon as instructed. You do not need to follow up with anesthesia unless specifically instructed to do so.  WOUND CARE INSTRUCTIONS (if applicable):  Keep a dry clean dressing on the anesthesia/puncture wound site if there is drainage.  Once the wound has quit draining you may leave it open to air.  Generally you should leave the bandage intact for twenty four hours unless there is  drainage.  If the epidural site drains for more than 36-48 hours please call the anesthesia department.  QUESTIONS?:  Please feel free to call your physician or the hospital operator if you have any questions, and they will be happy to assist you.

## 2016-01-21 ENCOUNTER — Other Ambulatory Visit: Payer: Self-pay

## 2016-01-21 ENCOUNTER — Encounter (HOSPITAL_COMMUNITY): Payer: Self-pay

## 2016-01-21 ENCOUNTER — Encounter (HOSPITAL_COMMUNITY)
Admission: RE | Admit: 2016-01-21 | Discharge: 2016-01-21 | Disposition: A | Discharge status: Home / Self Care | Payer: Medicaid Other | Source: Ambulatory Visit | Attending: Gastroenterology | Admitting: Gastroenterology

## 2016-01-21 DIAGNOSIS — Z01810 Encounter for preprocedural cardiovascular examination: Secondary | ICD-10-CM | POA: Insufficient documentation

## 2016-01-21 DIAGNOSIS — Z85038 Personal history of other malignant neoplasm of large intestine: Secondary | ICD-10-CM | POA: Diagnosis not present

## 2016-01-25 ENCOUNTER — Ambulatory Visit (HOSPITAL_COMMUNITY): Payer: Medicaid Other | Admitting: Anesthesiology

## 2016-01-25 ENCOUNTER — Other Ambulatory Visit: Payer: Self-pay | Admitting: Physician Assistant

## 2016-01-25 ENCOUNTER — Encounter (HOSPITAL_COMMUNITY)
Admission: RE | Disposition: A | Discharge status: Home / Self Care | Payer: Self-pay | Source: Ambulatory Visit | Attending: Gastroenterology

## 2016-01-25 ENCOUNTER — Ambulatory Visit (HOSPITAL_COMMUNITY)
Admission: RE | Admit: 2016-01-25 | Discharge: 2016-01-25 | Disposition: A | Discharge status: Home / Self Care | Payer: Medicaid Other | Source: Ambulatory Visit | Attending: Gastroenterology | Admitting: Gastroenterology

## 2016-01-25 ENCOUNTER — Encounter (HOSPITAL_COMMUNITY): Payer: Self-pay | Admitting: *Deleted

## 2016-01-25 ENCOUNTER — Other Ambulatory Visit (HOSPITAL_COMMUNITY): Payer: Self-pay | Admitting: Oncology

## 2016-01-25 DIAGNOSIS — F329 Major depressive disorder, single episode, unspecified: Secondary | ICD-10-CM | POA: Insufficient documentation

## 2016-01-25 DIAGNOSIS — Z803 Family history of malignant neoplasm of breast: Secondary | ICD-10-CM | POA: Diagnosis not present

## 2016-01-25 DIAGNOSIS — Z79899 Other long term (current) drug therapy: Secondary | ICD-10-CM | POA: Insufficient documentation

## 2016-01-25 DIAGNOSIS — Z1211 Encounter for screening for malignant neoplasm of colon: Secondary | ICD-10-CM | POA: Diagnosis not present

## 2016-01-25 DIAGNOSIS — K219 Gastro-esophageal reflux disease without esophagitis: Secondary | ICD-10-CM | POA: Diagnosis not present

## 2016-01-25 DIAGNOSIS — Z6829 Body mass index (BMI) 29.0-29.9, adult: Secondary | ICD-10-CM | POA: Insufficient documentation

## 2016-01-25 DIAGNOSIS — Z85038 Personal history of other malignant neoplasm of large intestine: Secondary | ICD-10-CM

## 2016-01-25 DIAGNOSIS — F419 Anxiety disorder, unspecified: Secondary | ICD-10-CM | POA: Diagnosis not present

## 2016-01-25 DIAGNOSIS — F17210 Nicotine dependence, cigarettes, uncomplicated: Secondary | ICD-10-CM | POA: Insufficient documentation

## 2016-01-25 DIAGNOSIS — I10 Essential (primary) hypertension: Secondary | ICD-10-CM | POA: Insufficient documentation

## 2016-01-25 DIAGNOSIS — Z806 Family history of leukemia: Secondary | ICD-10-CM | POA: Diagnosis not present

## 2016-01-25 DIAGNOSIS — E669 Obesity, unspecified: Secondary | ICD-10-CM | POA: Diagnosis not present

## 2016-01-25 DIAGNOSIS — E7800 Pure hypercholesterolemia, unspecified: Secondary | ICD-10-CM | POA: Diagnosis not present

## 2016-01-25 DIAGNOSIS — K621 Rectal polyp: Secondary | ICD-10-CM | POA: Diagnosis not present

## 2016-01-25 DIAGNOSIS — K648 Other hemorrhoids: Secondary | ICD-10-CM | POA: Insufficient documentation

## 2016-01-25 DIAGNOSIS — R9431 Abnormal electrocardiogram [ECG] [EKG]: Secondary | ICD-10-CM | POA: Diagnosis not present

## 2016-01-25 DIAGNOSIS — Z801 Family history of malignant neoplasm of trachea, bronchus and lung: Secondary | ICD-10-CM | POA: Insufficient documentation

## 2016-01-25 DIAGNOSIS — C184 Malignant neoplasm of transverse colon: Secondary | ICD-10-CM

## 2016-01-25 DIAGNOSIS — K529 Noninfective gastroenteritis and colitis, unspecified: Secondary | ICD-10-CM | POA: Insufficient documentation

## 2016-01-25 DIAGNOSIS — F4310 Post-traumatic stress disorder, unspecified: Secondary | ICD-10-CM | POA: Diagnosis not present

## 2016-01-25 DIAGNOSIS — Z8249 Family history of ischemic heart disease and other diseases of the circulatory system: Secondary | ICD-10-CM | POA: Diagnosis not present

## 2016-01-25 DIAGNOSIS — K644 Residual hemorrhoidal skin tags: Secondary | ICD-10-CM | POA: Insufficient documentation

## 2016-01-25 HISTORY — PX: COLONOSCOPY WITH PROPOFOL: SHX5780

## 2016-01-25 HISTORY — PX: POLYPECTOMY: SHX5525

## 2016-01-25 HISTORY — PX: BIOPSY: SHX5522

## 2016-01-25 SURGERY — COLONOSCOPY WITH PROPOFOL
Anesthesia: Monitor Anesthesia Care

## 2016-01-25 MED ORDER — MIDAZOLAM HCL 2 MG/2ML IJ SOLN
INTRAMUSCULAR | Status: AC
  Filled 2016-01-25: qty 2

## 2016-01-25 MED ORDER — CHLORHEXIDINE GLUCONATE CLOTH 2 % EX PADS: 6.0000 | MEDICATED_PAD | Freq: Once | CUTANEOUS | Status: DC

## 2016-01-25 MED ORDER — FENTANYL CITRATE (PF) 100 MCG/2ML IJ SOLN
25.0000 ug | INTRAMUSCULAR | Status: AC | PRN
  Administered 2016-01-25 (×2): 25 ug via INTRAVENOUS

## 2016-01-25 MED ORDER — FENTANYL CITRATE (PF) 100 MCG/2ML IJ SOLN
INTRAMUSCULAR | Status: AC
  Filled 2016-01-25: qty 2

## 2016-01-25 MED ORDER — LACTATED RINGERS IV SOLN
INTRAVENOUS | Status: DC
  Administered 2016-01-25: 10:00:00 via INTRAVENOUS

## 2016-01-25 MED ORDER — MIDAZOLAM HCL 2 MG/2ML IJ SOLN
1.0000 mg | INTRAMUSCULAR | Status: DC | PRN
  Administered 2016-01-25 (×3): 2 mg via INTRAVENOUS
  Filled 2016-01-25 (×2): qty 2

## 2016-01-25 MED ORDER — PROPOFOL 500 MG/50ML IV EMUL
INTRAVENOUS | Status: DC | PRN
  Administered 2016-01-25: 12:00:00 via INTRAVENOUS
  Administered 2016-01-25: 150 ug/kg/min via INTRAVENOUS

## 2016-01-25 MED ORDER — PROPOFOL 10 MG/ML IV BOLUS
INTRAVENOUS | Status: AC
  Filled 2016-01-25: qty 40

## 2016-01-25 MED ORDER — PROPOFOL 10 MG/ML IV BOLUS
INTRAVENOUS | Status: DC | PRN
  Administered 2016-01-25 (×3): 10 mg via INTRAVENOUS

## 2016-01-25 NOTE — Op Note (Addendum)
[facility] Patient Name: Erik [name] Procedure Date: 01/25/2016 11:16 AM MRN: [id number] Date of Birth: [birth year] Attending MD: Barney Drain , MD CSN: [id number] Age: 60 Admit Type: Outpatient Procedure:                Colonoscopy WITH COLD FORCEPS BIOPSY/POLYPECTOMY Indications:              High risk colon cancer surveillance: Personal                            history of colon cancer Providers:                Barney Drain, MD, [place] B. Sharon Seller, RN, [place].                            [place], [employer] Referring MD:              Medicines:                Propofol per Anesthesia Complications:            No immediate complications. Estimated Blood Loss:     Estimated blood loss was minimal. Procedure:                Pre-Anesthesia Assessment:                           - Prior to the procedure, a History and Physical                            was performed, and patient medications and                            allergies were reviewed. The patient's tolerance of                            previous anesthesia was also reviewed. The risks                            and benefits of the procedure and the sedation                            options and risks were discussed with the patient.                            All questions were answered, and informed consent                            was obtained. Prior Anticoagulants: The patient has                            taken no previous anticoagulant or antiplatelet                            agents. ASA Grade Assessment: II - A patient with  mild systemic disease. After reviewing the risks                            and benefits, the patient was deemed in                            satisfactory condition to undergo the procedure.                            After obtaining informed consent, the colonoscope                            was passed under direct vision. Throughout the           procedure, the patient's blood pressure, pulse, and                            oxygen saturations were monitored continuously. The                            EC-[id number] scope was introduced through                            the anus and advanced to the the terminal ileum.                            The terminal ileum and the rectum were                            photographed. The colonoscopy was technically                            difficult and complex due to significant looping.                            Successful completion of the procedure was aided by                            COLOWRAP. The patient tolerated the procedure well.                            The quality of the bowel preparation was excellent. Scope In: 11:25:20 AM Scope Out: 11:42:10 AM Scope Withdrawal Time: 0 hours 11 minutes 13 seconds  Total Procedure Duration: 0 hours 16 minutes 50 seconds  Findings:      The digital rectal exam findings include non-thrombosed external       hemorrhoids.      A 4 mm polyp was found in the rectum. The polyp was sessile. The polyp       was removed with a cold biopsy forceps. Resection and retrieval were       complete.      A patchy area of the neo-terminal ileum was congested. This was biopsied       with a cold forceps for histology.      The recto-sigmoid colon and sigmoid colon were moderately redundant.  Internal hemorrhoids were found. The hemorrhoids were small. Impression:               - Non-thrombosed external hemorrhoids found on                            digital rectal exam.                           - One 4 mm polyp in the rectum, removed with a cold                            biopsy forceps. Resected and retrieved.                           - Congested mucosa in the neo-terminal ileum.                            Biopsied.                           - Redundant colon.                           - Internal hemorrhoids. Moderate Sedation:       Per Anesthesia Care Recommendation:           - High fiber diet.                           - Continue present medications. ADD TUMS WITH MEALS                            AND AT BEDTIME TO PREVENT DIARRHEA. IMODIUM IF                            NEEDED.                           - Await pathology results.                           - Repeat colonoscopy in 3 years for surveillance.                           - Patient has a contact number available for                            emergencies. The signs and symptoms of potential                            delayed complications were discussed with the                            patient. Return to normal activities tomorrow.                            Written discharge instructions were  provided to the                            patient. Procedure Code(s):        --- Professional ---                           [phone], Colonoscopy, flexible; with biopsy, single                            or multiple Diagnosis Code(s):        --- Professional ---                           [id number], Personal history of other malignant                            neoplasm of large intestine                           K62.1, Rectal polyp                           K63.89, Other specified diseases of intestine                           K64.4, Residual hemorrhoidal skin tags                           K64.8, Other hemorrhoids                           Q43.8, Other specified congenital malformations of                            intestine CPT copyright 2016 American Medical Association. All rights reserved. The codes documented in this report are preliminary and upon coder review may  be revised to meet current compliance requirements. Barney Drain, MD Barney Drain, MD 01/25/2016 11:55:23 AM This report has been signed electronically. Number of Addenda: 0

## 2016-01-25 NOTE — H&P (Signed)
Primary Care Physician:  [name], PA-C Primary Gastroenterologist:  Dr. Oneida Alar  Pre-Procedure History & Physical: HPI:  Erik [name] is a 60 y.o. male here for  PERSONAL HISTORY OF COLON CANCER. DEC 2016.  Past Medical History:  Diagnosis Date  . Abnormal stress echocardiogram   . Adenocarcinoma of transverse colon ([place]) 02/10/2015  . Alcohol abuse    Heavy Use up until 2010  . Anxiety   . Depression   . GERD (gastroesophageal reflux disease)   . Head trauma 2001   closed head injury; coma for 4 weeks  . Hypercholesterolemia   . Hypertension   . PTSD (post-traumatic stress disorder)     Past Surgical History:  Procedure Laterality Date  . cardiac cath    . COLONOSCOPY  2011   Dr. Oneida Alar: multiple adenomas and hyperplastic polyps  . COLONOSCOPY WITH PROPOFOL N/A 01/12/2015   Procedure: COLONOSCOPY WITH PROPOFOL;  Surgeon: Danie Binder, MD;  Location: [facility];  Service: Endoscopy;  Laterality: N/A;  1030  . CRANIOTOMY  2001  . ESOPHAGOGASTRODUODENOSCOPY (EGD) WITH PROPOFOL N/A 01/12/2015   Procedure: ESOPHAGOGASTRODUODENOSCOPY (EGD) WITH PROPOFOL;  Surgeon: Danie Binder, MD;  Location: [facility];  Service: Endoscopy;  Laterality: N/A;  . head injury surgery    . HERNIA REPAIR Right 2012   Inguinal- [facility]  . KIDNEY SURGERY     >30 years ago  . LAPAROSCOPIC RIGHT HEMI COLECTOMY Left 02/10/2015   Procedure: LAPAROSCOPIC THEN OPEN RIGHT HEMI COLECTOMY;  Surgeon: [name], MD;  Location: [facility] ORS;  Service: General;  Laterality: Left;  . PORTACATH PLACEMENT N/A 03/24/2015   Procedure: INSERTION PORT-A-CATH;  Surgeon: Jules Husbands, MD;  Location: [facility] ORS;  Service: General;  Laterality: N/A;    Prior to Admission medications   Medication Sig Start Date End Date Taking? Authorizing Provider  albuterol (PROVENTIL HFA;VENTOLIN HFA) 108 (90 BASE) MCG/ACT inhaler Inhale 2 puffs into the lungs every 6 (six) hours as needed for wheezing or shortness of  breath. 12/08/14  Yes [name], PA-C  amitriptyline (ELAVIL) 25 MG tablet TAKE 1 TABLET BY MOUTH EVERY NIGHT AT BEDTIME Patient taking differently: TAKE 1 TABLET BY MOUTH EVERY NIGHT AT BEDTIME AS NEEDED FOR SLEEP 11/07/15  Yes Manon Hilding Kefalas, PA-C  atorvastatin (LIPITOR) 20 MG tablet Take 1 tablet (20 mg total) by mouth daily. 03/22/15  Yes [name], PA-C  cyanocobalamin (,VITAMIN B-12,) 1000 MCG/ML injection Inject 1,000 mcg into the muscle every 30 (thirty) days.   Yes Historical Provider, MD  dexlansoprazole (DEXILANT) 60 MG capsule Take 1 capsule (60 mg total) by mouth daily. 06/17/15  Yes [name], PA-C  FLUoxetine (PROZAC) 20 MG capsule Take 1 capsule (20 mg total) by mouth 2 (two) times daily. Patient taking differently: Take 40 mg by mouth daily.  12/27/15  Yes [name], PA-C  gabapentin (NEURONTIN) 300 MG capsule Take 4 capsules (1,200 mg total) by mouth at bedtime. Patient taking differently: Take 600-1,200 mg by mouth at bedtime. 600 mg in the morning and 1200 mg in the evening 12/15/15  Yes Baird Cancer, PA-C  HYDROcodone-acetaminophen (NORCO) 5-325 MG tablet Take 1 tablet by mouth every 6 (six) hours as needed for moderate pain. 11/26/15  Yes Baird Cancer, PA-C  loperamide (IMODIUM) 2 MG capsule TAKE 1 TO 2 CAPSULES BY MOUTH EVERY 6 HOURS AS NEEDED FOR DIARRHEA OR LOOSE STOOLS. 12/28/15  Yes Baird Cancer, PA-C  polyethylene glycol-electrolytes (TRILYTE) 420 g solution Take  4,000 mLs by mouth as directed. 01/06/16  Yes Danie Binder, MD    Allergies as of 01/06/2016  . (No Known Allergies)    Family History  Problem Relation Age of Onset  . Pulmonary embolism Mother   . Breast cancer Mother   . Cancer Mother   . Heart disease Father   . Cancer Father 63    Leukemia  . Cancer Paternal Grandfather     Lung  . Ataxia Neg Hx   . Chorea Neg Hx   . Dementia Neg Hx   . Mental retardation Neg Hx   . Migraines Neg Hx   . Multiple sclerosis  Neg Hx   . Neurofibromatosis Neg Hx   . Neuropathy Neg Hx   . Parkinsonism Neg Hx   . Seizures Neg Hx   . Stroke Neg Hx   . Colon cancer Neg Hx     Social History   Social History  . Marital status: Divorced    Spouse name: N/A  . Number of children: N/A  . Years of education: N/A   Occupational History  . Full time    Social History Main Topics  . Smoking status: Current Some Day Smoker    Packs/day: 0.10    Years: 30.00    Types: Cigarettes  . Smokeless tobacco: Never Used     Comment: smokes every 2-3 days, 2-3 a day when smokes  . Alcohol use 0.0 oz/week     Comment: beer occ, history of ETOH abuse in remote past.   . Drug use: No     Comment: marijuana last 2012  . Sexual activity: No   Other Topics Concern  . Not on file   Social History Narrative   Single    Review of Systems: See HPI, otherwise negative ROS   Physical Exam: BP 116/76   Pulse 92   Temp 97.5 F (36.4 C) (Oral)   Resp (!) 24   Ht 6\' 2"  (1.88 m)   Wt 233 lb (105.7 kg)   SpO2 95%   BMI 29.92 kg/m  General:   Alert,  pleasant and cooperative in NAD Head:  Normocephalic and atraumatic. Neck:  Supple; Lungs:  Clear throughout to auscultation.    Heart:  Regular rate and rhythm. Abdomen:  Soft, nontender and nondistended. Normal bowel sounds, without guarding, and without rebound.   Neurologic:  Alert and  oriented x4;  grossly normal neurologically.  Impression/Plan:    PERSONAL HISTORY OF COLON CANCER IN DEC 2016. PLAN:  1. TCS TODAY. DISCUSSED PROCEDURE, BENEFITS, & RISKS: < 1% chance of medication reaction, bleeding, perforation, or rupture of spleen/liver.

## 2016-01-25 NOTE — Discharge Instructions (Signed)
You had 1 small polyp removed FROM YOUR RECTUM. You have SMALL internal hemorrhoids. YOUR SMALL BOWEL WAS MILDLY IRRITATED. I BIOPSIED YOUR SMALL BOWEL.   ADD TUMS WITH MEALS TO PREVENT DIARRHEA. YOU CAN TAKE UP TO THREE DAILY OR ONE AT BEDTIME.  DRINK WATER TO KEEP YOUR URINE LIGHT YELLOW.  FOLLOW A HIGH FIBER DIET. AVOID ITEMS THAT CAUSE BLOATING & GAS. SEE INFO BELOW.  YOUR BIOPSY RESULTS WILL BE AVAILABLE IN MY CHART AFTER DEC 22 AND MY OFFICE WILL CONTACT YOU IN 10-14 DAYS WITH YOUR RESULTS.   Next colonoscopy in 3 years. YOUR SISTERS, BROTHERS, CHILDREN, AND PARENTS NEED TO HAVE A COLONOSCOPY STARTING AT THE AGE OF 40 AND THEN EVERY 5 YEARS.   Colonoscopy Care After Read the instructions outlined below and refer to this sheet in the next week. These discharge instructions provide you with general information on caring for yourself after you leave the hospital. While your treatment has been planned according to the most current medical practices available, unavoidable complications occasionally occur. If you have any problems or questions after discharge, call DR. Eulogia Dismore, [phone].  ACTIVITY  You may resume your regular activity, but move at a slower pace for the next 24 hours.   Take frequent rest periods for the next 24 hours.   Walking will help get rid of the air and reduce the bloated feeling in your belly (abdomen).   No driving for 24 hours (because of the medicine (anesthesia) used during the test).   You may shower.   Do not sign any important legal documents or operate any machinery for 24 hours (because of the anesthesia used during the test).    NUTRITION  Drink plenty of fluids.   You may resume your normal diet as instructed by your doctor.   Begin with a light meal and progress to your normal diet. Heavy or fried foods are harder to digest and may make you feel sick to your stomach (nauseated).   Avoid alcoholic beverages for 24 hours or as instructed.     MEDICATIONS  You may resume your normal medications.   WHAT YOU CAN EXPECT TODAY  Some feelings of bloating in the abdomen.   Passage of more gas than usual.   Spotting of blood in your stool or on the toilet paper  .  IF YOU HAD POLYPS REMOVED DURING THE COLONOSCOPY:  Eat a soft diet IF YOU HAVE NAUSEA, BLOATING, ABDOMINAL PAIN, OR VOMITING.    FINDING OUT THE RESULTS OF YOUR TEST Not all test results are available during your visit. DR. Oneida Alar WILL CALL YOU WITHIN 14 DAYS OF YOUR PROCEDUE WITH YOUR RESULTS. Do not assume everything is normal if you have not heard from DR. Akaash Vandewater, CALL HER OFFICE AT [phone].  SEEK IMMEDIATE MEDICAL ATTENTION AND CALL THE OFFICE: [phone] IF:  You have more than a spotting of blood in your stool.   Your belly is swollen (abdominal distention).   You are nauseated or vomiting.   You have a temperature over 101F.   You have abdominal pain or discomfort that is severe or gets worse throughout the day.   High-Fiber Diet A high-fiber diet changes your normal diet to include more whole grains, legumes, fruits, and vegetables. Changes in the diet involve replacing refined carbohydrates with unrefined foods. The calorie level of the diet is essentially unchanged. The Dietary Reference Intake (recommended amount) for adult males is 38 grams per day. For adult females, it is 25 grams per  day. Pregnant and lactating women should consume 28 grams of fiber per day. Fiber is the intact part of a plant that is not broken down during digestion. Functional fiber is fiber that has been isolated from the plant to provide a beneficial effect in the body. PURPOSE  Increase stool bulk.   Ease and regulate bowel movements.   Lower cholesterol.   REDUCE RISK OF COLON CANCER  INDICATIONS THAT YOU NEED MORE FIBER  Constipation and hemorrhoids.   Uncomplicated diverticulosis (intestine condition) and irritable bowel syndrome.   Weight  management.   As a protective measure against hardening of the arteries (atherosclerosis), diabetes, and cancer.   GUIDELINES FOR INCREASING FIBER IN THE DIET  Start adding fiber to the diet slowly. A gradual increase of about 5 more grams (2 slices of whole-wheat bread, 2 servings of most fruits or vegetables, or 1 bowl of high-fiber cereal) per day is best. Too rapid an increase in fiber may result in constipation, flatulence, and bloating.   Drink enough water and fluids to keep your urine clear or pale yellow. Water, juice, or caffeine-free drinks are recommended. Not drinking enough fluid may cause constipation.   Eat a variety of high-fiber foods rather than one type of fiber.   Try to increase your intake of fiber through using high-fiber foods rather than fiber pills or supplements that contain small amounts of fiber.   The goal is to change the types of food eaten. Do not supplement your present diet with high-fiber foods, but replace foods in your present diet.   INCLUDE A VARIETY OF FIBER SOURCES  Replace refined and processed grains with whole grains, canned fruits with fresh fruits, and incorporate other fiber sources. White rice, white breads, and most bakery goods contain little or no fiber.   Brown whole-grain rice, buckwheat oats, and many fruits and vegetables are all good sources of fiber. These include: broccoli, Brussels sprouts, cabbage, cauliflower, beets, sweet potatoes, white potatoes (skin on), carrots, tomatoes, eggplant, squash, berries, fresh fruits, and dried fruits.   Cereals appear to be the richest source of fiber. Cereal fiber is found in whole grains and bran. Bran is the fiber-rich outer coat of cereal grain, which is largely removed in refining. In whole-grain cereals, the bran remains. In breakfast cereals, the largest amount of fiber is found in those with "bran" in their names. The fiber content is sometimes indicated on the label.   You may need to  include additional fruits and vegetables each day.   In baking, for 1 cup white flour, you may use the following substitutions:   1 cup whole-wheat flour minus 2 tablespoons.   1/2 cup white flour plus 1/2 cup whole-wheat flour.   Polyps, Colon  A polyp is extra tissue that grows inside your body. Colon polyps grow in the large intestine. The large intestine, also called the colon, is part of your digestive system. It is a long, hollow tube at the end of your digestive tract where your body makes and stores stool. Most polyps are not dangerous. They are benign. This means they are not cancerous. But over time, some types of polyps can turn into cancer. Polyps that are smaller than a pea are usually not harmful. But larger polyps could someday become or may already be cancerous. To be safe, doctors remove all polyps and test them.   PREVENTION There is not one sure way to prevent polyps. You might be able to lower your risk of getting them  if you:  Eat more fruits and vegetables and less fatty food.   Do not smoke.   Avoid alcohol.   Exercise every day.   Lose weight if you are overweight.   Eating more calcium and folate can also lower your risk of getting polyps. Some foods that are rich in calcium are milk, cheese, and broccoli. Some foods that are rich in folate are chickpeas, kidney beans, and spinach.   Hemorrhoids Hemorrhoids are dilated (enlarged) veins around the rectum. Sometimes clots will form in the veins. This makes them swollen and painful. These are called thrombosed hemorrhoids. Causes of hemorrhoids include:  Constipation.   Straining to have a bowel movement.   HEAVY LIFTING  HOME CARE INSTRUCTIONS  Eat a well balanced diet and drink 6 to 8 glasses of water every day to avoid constipation. You may also use a bulk laxative.   Avoid straining to have bowel movements.   Keep anal area dry and clean.   Do not use a donut shaped pillow or sit on the toilet  for long periods. This increases blood pooling and pain.   Move your bowels when your body has the urge; this will require less straining and will decrease pain and pressure.

## 2016-01-25 NOTE — Transfer of Care (Signed)
Immediate Anesthesia Transfer of Care Note  Patient: Erik [name]  Procedure(s) Performed: Procedure(s) with comments: COLONOSCOPY WITH PROPOFOL (N/A) - 1230 BIOPSY - ileum;  POLYPECTOMY - colon  Patient Location: PACU  Anesthesia Type:MAC  Level of Consciousness: awake  Airway & Oxygen Therapy: Patient Spontanous Breathing  Post-op Assessment: Report given to RN  Post vital signs: Reviewed  Last Vitals:  Vitals:   01/25/16 1105 01/25/16 1110  BP: 116/70   Pulse:    Resp: (!) 45 16  Temp:      Last Pain:  Vitals:   01/25/16 1013  TempSrc: Oral      Patients Stated Pain Goal: 6 ([id number])  Complications: No apparent anesthesia complications

## 2016-01-25 NOTE — Anesthesia Postprocedure Evaluation (Signed)
Anesthesia Post Note  Patient: Erik [name]  Procedure(s) Performed: Procedure(s) (LRB): COLONOSCOPY WITH PROPOFOL (N/A) BIOPSY POLYPECTOMY  Patient location during evaluation: PACU Anesthesia Type: MAC Level of consciousness: awake and alert and oriented Pain management: pain level controlled Vital Signs Assessment: post-procedure vital signs reviewed and stable Respiratory status: spontaneous breathing Cardiovascular status: blood pressure returned to baseline Postop Assessment: no signs of nausea or vomiting Anesthetic complications: no     Last Vitals:  Vitals:   01/25/16 1105 01/25/16 1110  BP: 116/70   Pulse:    Resp: (!) 45 16  Temp:      Last Pain:  Vitals:   01/25/16 1013  TempSrc: Oral                 [name]

## 2016-01-25 NOTE — Anesthesia Preprocedure Evaluation (Signed)
Anesthesia Evaluation  Patient identified by MRN, date of birth, ID band Patient awake    Reviewed: Allergy & Precautions, NPO status , Patient's Chart, lab work & pertinent test results  Airway Mallampati: II       Dental  (+) Teeth Intact, Missing, Poor Dentition, Dental Advisory Given,    Pulmonary Current Smoker,    Pulmonary exam normal        Cardiovascular hypertension, Pt. on medications + CAD  Normal cardiovascular exam     Neuro/Psych  Headaches, PSYCHIATRIC DISORDERS Anxiety Depression    GI/Hepatic GERD  Medicated and Poorly Controlled,(+)     substance abuse  alcohol use,   Endo/Other    Renal/GU      Musculoskeletal   Abdominal (+) + obese,   Peds  Hematology   Anesthesia Other Findings   Reproductive/Obstetrics                             Anesthesia Physical Anesthesia Plan  ASA: III  Anesthesia Plan: MAC   Post-op Pain Management:    Induction: Intravenous  Airway Management Planned: Simple Face Mask  Additional Equipment:   Intra-op Plan:   Post-operative Plan:   Informed Consent: I have reviewed the patients History and Physical, chart, labs and discussed the procedure including the risks, benefits and alternatives for the proposed anesthesia with the patient or authorized representative who has indicated his/her understanding and acceptance.     Plan Discussed with: CRNA  Anesthesia Plan Comments:         Anesthesia Quick Evaluation

## 2016-01-27 ENCOUNTER — Telehealth: Payer: Self-pay | Admitting: Gastroenterology

## 2016-01-27 NOTE — Telephone Encounter (Signed)
PLEASE CALL PT. HIS ECG WAS NOT NORMAL. HE SHOULD SEE DR. BRANCH TO DISCUSS THE NEED FOR ADDITIONAL EVALUATION. He had HYPERPLASTIC POLYP removed AND THE SMALL BOWEL BIOPSIES SHOW NONSPECIFIC INFLAMMATION.   ADD TUMS WITH MEALS TO PREVENT DIARRHEA. YOU CAN TAKE UP TO THREE DAILY OR ONE AT BEDTIME.  DRINK WATER TO KEEP YOUR URINE LIGHT YELLOW.  FOLLOW A HIGH FIBER DIET. AVOID ITEMS THAT CAUSE BLOATING & GAS.   OPV IN 6 MOS E30 DIARRHEA.  Next colonoscopy in 3 years. YOUR SISTERS, BROTHERS, CHILDREN, AND PARENTS NEED TO HAVE A COLONOSCOPY STARTING AT THE AGE OF 40 AND THEN EVERY 5 YEARS.

## 2016-01-27 NOTE — Telephone Encounter (Signed)
REMINDER IN EPIC °

## 2016-01-27 NOTE — Telephone Encounter (Signed)
LMOM to call.

## 2016-01-28 ENCOUNTER — Encounter (HOSPITAL_COMMUNITY): Payer: Self-pay | Admitting: Gastroenterology

## 2016-01-29 NOTE — Progress Notes (Signed)
REVIEWED-NO ADDITIONAL RECOMMENDATIONS. 

## 2016-02-02 NOTE — Telephone Encounter (Signed)
PT's phone is not working. I called his emergency contact and spoke to [facility].  She will have pt call. She said his phone is messed up and he is waiting for a new phone.

## 2016-02-02 NOTE — Telephone Encounter (Signed)
Pt called and already sees Dr. Harl Bowie, he will follow up on the abnormal EKG.  He is aware of all of the findings and recommendations.

## 2016-02-14 ENCOUNTER — Encounter: Payer: Self-pay | Admitting: Adult Health

## 2016-02-14 ENCOUNTER — Ambulatory Visit (INDEPENDENT_AMBULATORY_CARE_PROVIDER_SITE_OTHER): Payer: Medicaid Other | Admitting: Adult Health

## 2016-02-14 ENCOUNTER — Encounter (HOSPITAL_COMMUNITY): Payer: Medicaid Other | Attending: Hematology & Oncology

## 2016-02-14 VITALS — BP 155/72 | HR 111 | Temp 98.2°F | Resp 18

## 2016-02-14 VITALS — BP 132/78 | HR 98 | Ht 74.0 in | Wt 231.0 lb

## 2016-02-14 DIAGNOSIS — E538 Deficiency of other specified B group vitamins: Secondary | ICD-10-CM | POA: Diagnosis not present

## 2016-02-14 DIAGNOSIS — R9431 Abnormal electrocardiogram [ECG] [EKG]: Secondary | ICD-10-CM | POA: Diagnosis not present

## 2016-02-14 DIAGNOSIS — Z09 Encounter for follow-up examination after completed treatment for conditions other than malignant neoplasm: Secondary | ICD-10-CM | POA: Diagnosis not present

## 2016-02-14 DIAGNOSIS — I2510 Atherosclerotic heart disease of native coronary artery without angina pectoris: Secondary | ICD-10-CM | POA: Diagnosis not present

## 2016-02-14 DIAGNOSIS — I10 Essential (primary) hypertension: Secondary | ICD-10-CM | POA: Diagnosis not present

## 2016-02-14 DIAGNOSIS — C184 Malignant neoplasm of transverse colon: Secondary | ICD-10-CM | POA: Insufficient documentation

## 2016-02-14 DIAGNOSIS — R911 Solitary pulmonary nodule: Secondary | ICD-10-CM

## 2016-02-14 MED ORDER — CYANOCOBALAMIN 1000 MCG/ML IJ SOLN
1000.0000 ug | Freq: Once | INTRAMUSCULAR | Status: AC
  Administered 2016-02-14: 1000 ug via INTRAMUSCULAR

## 2016-02-14 MED ORDER — HYDROCODONE-ACETAMINOPHEN 5-325 MG PO TABS: 1.0000 | ORAL_TABLET | Freq: Four times a day (QID) | ORAL | 0 refills | Status: DC | PRN

## 2016-02-14 MED ORDER — CYANOCOBALAMIN 1000 MCG/ML IJ SOLN
INTRAMUSCULAR | Status: AC
  Filled 2016-02-14: qty 1

## 2016-02-14 NOTE — Progress Notes (Signed)
Name: Erik [name]    DOB: Sep 25, 1955  Age: 61 y.o.  MR#: [id number]       PCP:  [name], PA-C      Insurance: Payor: MEDICAID [place] / Plan: MEDICAID OF [place] / Product Type: *No Product type* /   CC:   No chief complaint on file.   VS Vitals:   02/14/16 1506  Pulse: 98  SpO2: 94%  Weight: 231 lb (104.8 kg)  Height: 6\' 2"  (1.88 m)    Weights Current Weight  02/14/16 231 lb (104.8 kg)  01/25/16 233 lb (105.7 kg)  01/21/16 233 lb (105.7 kg)    Blood Pressure  BP Readings from Last 3 Encounters:  02/14/16 (!) 155/72  01/25/16 121/73  01/21/16 (!) 139/57     Admit date:  (Not on file) Last encounter with RMR:  Visit date not found   Allergy Patient has no known allergies.  Current Outpatient Prescriptions  Medication Sig Dispense Refill  . albuterol (PROVENTIL HFA;VENTOLIN HFA) 108 (90 BASE) MCG/ACT inhaler Inhale 2 puffs into the lungs every 6 (six) hours as needed for wheezing or shortness of breath. 1 Inhaler 1  . amitriptyline (ELAVIL) 25 MG tablet TAKE 1 TABLET BY MOUTH EVERY NIGHT AT BEDTIME. 30 tablet 1  . atorvastatin (LIPITOR) 20 MG tablet Take 1 tablet (20 mg total) by mouth daily. 90 tablet 1  . cyanocobalamin (,VITAMIN B-12,) 1000 MCG/ML injection Inject 1,000 mcg into the muscle every 30 (thirty) days.    Marland Kitchen dexlansoprazole (DEXILANT) 60 MG capsule Take 1 capsule (60 mg total) by mouth daily. 90 capsule 1  . FLUoxetine (PROZAC) 20 MG capsule TAKE 1 CAPSULE BY MOUTH 2 TIMES A DAY 60 capsule 1  . gabapentin (NEURONTIN) 300 MG capsule Take 4 capsules (1,200 mg total) by mouth at bedtime. (Patient taking differently: Take 600-1,200 mg by mouth at bedtime. 600 mg in the morning and 1200 mg in the evening) 120 capsule 1  . loperamide (IMODIUM) 2 MG capsule TAKE 1 TO 2 CAPSULES BY MOUTH EVERY 6 HOURS AS NEEDED FOR DIARRHEA OR LOOSE STOOLS. 60 capsule 1  . HYDROcodone-acetaminophen (NORCO) 5-325 MG tablet Take 1 tablet by mouth every 6 (six) hours as needed for  moderate pain. (Patient not taking: Reported on 02/14/2016) 60 tablet 0   No current facility-administered medications for this visit.    Facility-Administered Medications Ordered in Other Visits  Medication Dose Route Frequency Provider Last Rate Last Dose  . sodium chloride flush (NS) 0.9 % injection 10 mL  10 mL Intracatheter PRN Patrici Ranks, MD   10 mL at 08/23/15 1030    Discontinued Meds:   There are no discontinued medications.  Patient Active Problem List   Diagnosis Date Noted  . History of colon cancer   . Diarrhea 01/06/2016  . Cigarette nicotine dependence with nicotine-induced disorder 06/18/2015  . Chronic obstructive pulmonary disease ([place]) 06/18/2015  . Depression 06/18/2015  . B12 deficiency 04/14/2015  . Adenocarcinoma of transverse colon ([place]) 02/10/2015  . Tubular adenoma of colon 01/21/2015  . GERD (gastroesophageal reflux disease) 12/15/2014  . History of colonic polyps 12/15/2014  . Episodic tension-type headache, not intractable 09/16/2014  . Syncope and collapse 09/16/2014  . History of alcohol abuse 09/16/2014  . Bronchitis 07/11/2013  . Orthostatic syncope 05/07/2013  . SAH (subarachnoid hemorrhage) ([place]) 08/31/2012  . SDH (subdural hematoma) ([place]) 08/31/2012  . Bilateral lower extremity edema 04/24/2012  . Hyperlipidemia 04/21/2009  . ING HERN W/GANGREN RECUR UNILAT/UNSPEC ING  HERN 04/21/2009  . TOBACCO USER 03/25/2009  . CAD, NATIVE VESSEL 03/25/2009  . HYPERCHOLESTEROLEMIA  IIA 03/02/2009  . ALCOHOL ABUSE 03/02/2009  . DEPRESSION 03/02/2009  . ABNORMAL CV (STRESS) TEST 03/02/2009  . HEAD TRAUMA 03/02/2009  . ABNORMAL STRESS ELECTROCARDIOGRAM 02/23/2009    LABS    Component Value Date/Time   NA 138 12/15/2015 1319   NA 136 09/06/2015 0923   NA 137 08/23/2015 0921   K 4.1 12/15/2015 1319   K 3.9 09/06/2015 0923   K 3.8 08/23/2015 0921   CL 104 12/15/2015 1319   CL 102 09/06/2015 0923   CL 102 08/23/2015 0921   CO2 28 12/15/2015  1319   CO2 28 09/06/2015 0923   CO2 28 08/23/2015 0921   GLUCOSE 95 12/15/2015 1319   GLUCOSE 106 (H) 09/06/2015 0923   GLUCOSE 118 (H) 08/23/2015 0921   BUN 13 12/15/2015 1319   BUN 8 09/06/2015 0923   BUN 9 08/23/2015 0921   CREATININE 0.79 12/15/2015 1319   CREATININE 0.75 09/06/2015 0923   CREATININE 0.80 08/23/2015 0921   CREATININE 0.86 12/16/2014 0844   CREATININE 0.87 07/14/2014 0853   CALCIUM 9.3 12/15/2015 1319   CALCIUM 9.0 09/06/2015 0923   CALCIUM 9.1 08/23/2015 0921   GFRNONAA >60 12/15/2015 1319   GFRNONAA >60 09/06/2015 0923   GFRNONAA >60 08/23/2015 0921   GFRNONAA >89 12/16/2014 0844   GFRAA >60 12/15/2015 1319   GFRAA >60 09/06/2015 0923   GFRAA >60 08/23/2015 0921   GFRAA >89 12/16/2014 0844   CMP     Component Value Date/Time   NA 138 12/15/2015 1319   K 4.1 12/15/2015 1319   CL 104 12/15/2015 1319   CO2 28 12/15/2015 1319   GLUCOSE 95 12/15/2015 1319   BUN 13 12/15/2015 1319   CREATININE 0.79 12/15/2015 1319   CREATININE 0.86 12/16/2014 0844   CALCIUM 9.3 12/15/2015 1319   PROT 7.6 12/15/2015 1319   ALBUMIN 4.1 12/15/2015 1319   AST 18 12/15/2015 1319   ALT 16 (L) 12/15/2015 1319   ALKPHOS 82 12/15/2015 1319   BILITOT 0.6 12/15/2015 1319   GFRNONAA >60 12/15/2015 1319   GFRNONAA >89 12/16/2014 0844   GFRAA >60 12/15/2015 1319   GFRAA >89 12/16/2014 0844       Component Value Date/Time   WBC 7.2 12/15/2015 1319   WBC 6.5 09/06/2015 0923   WBC 6.6 08/23/2015 0921   HGB 14.1 12/15/2015 1319   HGB 13.9 09/06/2015 0923   HGB 13.3 08/23/2015 0921   HCT 41.7 12/15/2015 1319   HCT 41.7 09/06/2015 0923   HCT 39.9 08/23/2015 0921   MCV 93.1 12/15/2015 1319   MCV 95.2 09/06/2015 0923   MCV 97.6 08/23/2015 0921    Lipid Panel     Component Value Date/Time   CHOL 123 12/20/2015 0822   TRIG 111 12/20/2015 0822   HDL 45 12/20/2015 0822   CHOLHDL 2.7 12/20/2015 0822   VLDL 22 12/20/2015 0822   LDLCALC 56 12/20/2015 0822    ABG     Component Value Date/Time   PHART 7.47 (H) 02/12/2015 2330   PCO2ART 48 02/12/2015 2330   PO2ART 64 (L) 02/12/2015 2330   HCO3 34.9 (H) 02/12/2015 2330   O2SAT 93.4 02/12/2015 2330     Lab Results  Component Value Date   TSH 3.262 07/14/2014   BNP (last 3 results) No results for input(s): BNP in the last 8760 hours.  ProBNP (last 3 results) No results for  input(s): PROBNP in the last 8760 hours.  Cardiac Panel (last 3 results) No results for input(s): CKTOTAL, CKMB, TROPONINI, RELINDX in the last 72 hours.  Iron/TIBC/Ferritin/ %Sat    Component Value Date/Time   IRON 45 03/09/2015 1300   TIBC 361 03/09/2015 1300   FERRITIN 52 03/09/2015 1300   IRONPCTSAT 12 (L) 03/09/2015 1300     EKG Orders placed or performed during the hospital encounter of 01/21/16  . EKG 12-Lead  . EKG 12-Lead     Prior Assessment and Plan Problem List as of 02/14/2016 Reviewed: 02/14/2016 10:04 AM by Anselmo Rod, RN     Cardiovascular and Mediastinum   CAD, NATIVE VESSEL   Last Assessment & Plan 07/11/2013 Office Visit Written 07/11/2013  1:20 PM by Lendon Colonel, NP    No further react testing. The patient will continue on medical management only.      Orthostatic syncope   Last Assessment & Plan 07/11/2013 Office Visit Written 07/11/2013  1:19 PM by Lendon Colonel, NP    No further episodes of syncope near syncope or hypotension reported by the patient. No further cardiac evaluation will be completed at this time. We will see him again in one year. His symptoms persist he may need to followup with neurologist sooner. He is advised to avoid caffeine or any dehydrating substances. Blood pressure is currently well-controlled. He is not on antihypertensive medications.      Syncope and collapse     Respiratory   Bronchitis   Last Assessment & Plan 07/11/2013 Office Visit Written 07/11/2013  1:21 PM by Lendon Colonel, NP    He is having a lot of coughing and congestion, and sinus drainage  and pressure. Complains of some right rib pain. He is worried that he has pneumonia. He is advised to followup with his primary care provider at the [facility] clinic or be evaluated in the emergency room if he is unable to get an appointment.      Chronic obstructive pulmonary disease (HCC)     Digestive   GERD (gastroesophageal reflux disease)   Last Assessment & Plan 12/15/2014 Office Visit Written 12/18/2014 11:52 AM by Orvil Feil, NP    Refractory GERD despite PPI dosing once daily. Currently failed Prilosec, hast taken Nexium historically. Trial Dexilant. No dysphagia. Proceed with EGD for further evaluation.   Proceed with upper endoscopy in the near future with Dr. Oneida Alar. The risks, benefits, and alternatives have been discussed in detail with patient. They have stated understanding and desire to proceed.  Propofol due to polypharmacy and history of ETOH use in past       Tubular adenoma of colon   Adenocarcinoma of transverse colon [facility])   Last Assessment & Plan 01/06/2016 Office Visit Edited 01/06/2016  2:34 PM by Mahala Menghini, PA-C    61 year old gentleman diagnosed with stage IIIc adenocarcinoma of the transverse colon in December 2016, status post definitive surgery with right extended hemicolectomy and systemic adjuvant therapy.  Patient presents for one-year surveillance colonoscopy. Clinically he is doing well with the exception of postop diarrhea which is been persistent but manageable with Imodium 4 mg every morning. Plan on a colonoscopy in the near future with deep sedation given polypharmacy/prior alcohol abuse history. I have discussed the risks, alternatives, benefits with regards to but not limited to the risk of reaction to medication, bleeding, infection, perforation and the patient is agreeable to proceed. Written consent to be obtained.  I personally reviewed 10/2015  CT Abd with Dr. Thornton Papas regarding gastric findings. No concern for gastric abnormalities, findings felt to  be related to ingested contrast and food debris.         Nervous and Auditory   SAH (subarachnoid hemorrhage) (HCC)   SDH (subdural hematoma) (HCC)   Episodic tension-type headache, not intractable   Cigarette nicotine dependence with nicotine-induced disorder     Other   HYPERCHOLESTEROLEMIA  IIA   Hyperlipidemia   ALCOHOL ABUSE   TOBACCO USER   Last Assessment & Plan 05/30/2013 Office Visit Written 05/30/2013  2:59 PM by Lendon Colonel, NP    Advised to quit.       DEPRESSION   ING HERN W/GANGREN RECUR UNILAT/UNSPEC ING HERN   ABNORMAL STRESS ELECTROCARDIOGRAM   ABNORMAL CV (STRESS) TEST   HEAD TRAUMA   Bilateral lower extremity edema   Last Assessment & Plan 04/24/2012 Office Visit Written 04/24/2012  3:35 PM by Renella Cunas, MD    This is most likely secondary to some pulmonary disease from his long history of smoking as well as being overweight. I placed him on 20 mg of furosemide and 20 mEq potassium. I've encouraged him to stop smoking. He will followup at the free clinic.      History of alcohol abuse   History of colonic polyps   Last Assessment & Plan 12/15/2014 Office Visit Written 12/18/2014 11:51 AM by Orvil Feil, NP    61 year old male with history of multiple polyps in 2011, overdue for surveillance now. No lower GI symptoms of concern.   Proceed with colonoscopy with Dr. Oneida Alar in the near future. The risks, benefits, and alternatives have been discussed in detail with the patient. They state understanding and desire to proceed.  PROPOFOL due to polypharmacy, history of ETOH use in past      B12 deficiency   Last Assessment & Plan 12/15/2015 Office Visit Edited 12/15/2015  9:28 AM by Baird Cancer, PA-C    B12 deficiency with level at 149.  Continue B12 IM monthly.      Depression   Diarrhea   History of colon cancer       Imaging: No results found.

## 2016-02-14 NOTE — Patient Instructions (Signed)
Your physician recommends that you schedule a follow-up appointment in: 2 weeks with Arnold Long NP     Your physician recommends that you continue on your current medications as directed. Please refer to the Current Medication list given to you today.       Your physician has requested that you have an echocardiogram. Echocardiography is a painless test that uses sound waves to create images of your heart. It provides your doctor with information about the size and shape of your heart and how well your heart's chambers and valves are working. This procedure takes approximately one hour. There are no restrictions for this procedure.        Thank you for choosing [facility] !

## 2016-02-14 NOTE — Progress Notes (Signed)
Cardiology Office Note   Date:  02/14/2016   ID:  Reshaun, Baerga 1955/03/02, MRN [id number]  PCP:  [name], PA-C  Cardiologist:  Cloria Spring, NP   Chief Complaint  Patient presents with  . Abnormal ECG      History of Present Illness: Erik [name] is a 61 y.o. male who presents for ongoing assessment and management of syncopal episodes, orthostatic hypotension, and history of colon cancer followed by oncology. He did have a cardiac catheterization in 2011 which revealed minimal nonobstructive coronary artery disease.  Cardiac Cath ASSESSMENT:03/2009 1. Minimal nonobstructive coronary artery disease. 2. Normal left ventricular function. 3. Normal renal arteries. No evidence of abdominal aortic aneurysm.  The patient was last seen by Dr. Harl Bowie on 05/12/2015. There was no clear evidence of cardiac etiology for orthostatic syncope. Based upon testing which revealed no obstructive structural heart disease and normal ejection fraction. He was started on Florinef 0.1 mg daily.  He has been seen by Dr. Octaviano Batty and had a colonoscopy completed on 01/25/2016. This revealed non--thrombosed external hemorrhoids. One 4 mm polyp in the rectum, removed with cold biopsy forceps. Resected and retrieved. Congested mucosa in the knee O-terminal ileum. Redundant colon. Internal hemorrhoids. He was recommended for a high fiber diet and await pathology results.  The patient had an EKG prior to colonoscopy which revealed left anterior fascicular block. Due to this abnormality the patient was referred to cardiology for ongoing management.  The patient is completely asymptomatic. He denies chest pain dyspnea and dizziness or palpitations.   Past Medical History:  Diagnosis Date  . Abnormal stress echocardiogram   . Adenocarcinoma of transverse colon ([place]) 02/10/2015  . Alcohol abuse    Heavy Use up until 2010  . Anxiety   . Depression   . GERD (gastroesophageal reflux  disease)   . Head trauma 2001   closed head injury; coma for 4 weeks  . Hypercholesterolemia   . Hypertension   . PTSD (post-traumatic stress disorder)     Past Surgical History:  Procedure Laterality Date  . BIOPSY  01/25/2016   Procedure: BIOPSY;  Surgeon: Danie Binder, MD;  [facility];  Service: Endoscopy;;  ileum;   . cardiac cath    . COLONOSCOPY  2011   Dr. Oneida Alar: multiple adenomas and hyperplastic polyps  . COLONOSCOPY WITH PROPOFOL N/A 01/12/2015   Procedure: COLONOSCOPY WITH PROPOFOL;  Surgeon: Danie Binder, MD;  [facility];  Service: Endoscopy;  Laterality: N/A;  1030  . COLONOSCOPY WITH PROPOFOL N/A 01/25/2016   Procedure: COLONOSCOPY WITH PROPOFOL;  Surgeon: Danie Binder, MD;  [facility];  Service: Endoscopy;  Laterality: N/A;  1230  . CRANIOTOMY  2001  . ESOPHAGOGASTRODUODENOSCOPY (EGD) WITH PROPOFOL N/A 01/12/2015   Procedure: ESOPHAGOGASTRODUODENOSCOPY (EGD) WITH PROPOFOL;  Surgeon: Danie Binder, MD;  [facility];  Service: Endoscopy;  Laterality: N/A;  . head injury surgery    . HERNIA REPAIR Right 2012   Inguinal- Forestine Na  . KIDNEY SURGERY     >30 years ago  . LAPAROSCOPIC RIGHT HEMI COLECTOMY Left 02/10/2015   Procedure: LAPAROSCOPIC THEN OPEN RIGHT HEMI COLECTOMY;  Surgeon: [name], MD;  Location: [facility];  Service: General;  Laterality: Left;  . POLYPECTOMY  01/25/2016   Procedure: POLYPECTOMY;  Surgeon: Danie Binder, MD;  [facility];  Service: Endoscopy;;  colon  . PORTACATH PLACEMENT N/A 03/24/2015   Procedure: INSERTION  PORT-A-CATH;  Surgeon: Jules Husbands, MD;  Location: [facility];  Service: General;  Laterality: N/A;     Current Outpatient Prescriptions  Medication Sig Dispense Refill  . albuterol (PROVENTIL HFA;VENTOLIN HFA) 108 (90 BASE) MCG/ACT inhaler Inhale 2 puffs into the lungs every 6 (six) hours as needed for wheezing or shortness of breath. 1 Inhaler 1  .  amitriptyline (ELAVIL) 25 MG tablet TAKE 1 TABLET BY MOUTH EVERY NIGHT AT BEDTIME. 30 tablet 1  . atorvastatin (LIPITOR) 20 MG tablet Take 1 tablet (20 mg total) by mouth daily. 90 tablet 1  . cyanocobalamin (,VITAMIN B-12,) 1000 MCG/ML injection Inject 1,000 mcg into the muscle every 30 (thirty) days.    Marland Kitchen dexlansoprazole (DEXILANT) 60 MG capsule Take 1 capsule (60 mg total) by mouth daily. 90 capsule 1  . FLUoxetine (PROZAC) 20 MG capsule TAKE 1 CAPSULE BY MOUTH 2 TIMES A DAY 60 capsule 1  . gabapentin (NEURONTIN) 300 MG capsule Take 4 capsules (1,200 mg total) by mouth at bedtime. (Patient taking differently: Take 600-1,200 mg by mouth at bedtime. 600 mg in the morning and 1200 mg in the evening) 120 capsule 1  . loperamide (IMODIUM) 2 MG capsule TAKE 1 TO 2 CAPSULES BY MOUTH EVERY 6 HOURS AS NEEDED FOR DIARRHEA OR LOOSE STOOLS. 60 capsule 1  . HYDROcodone-acetaminophen (NORCO) 5-325 MG tablet Take 1 tablet by mouth every 6 (six) hours as needed for moderate pain. (Patient not taking: Reported on 02/14/2016) 60 tablet 0   No current facility-administered medications for this visit.    Facility-Administered Medications Ordered in Other Visits  Medication Dose Route Frequency Provider Last Rate Last Dose  . sodium chloride flush (NS) 0.9 % injection 10 mL  10 mL Intracatheter PRN Patrici Ranks, MD   10 mL at 08/23/15 1030    Allergies:   Patient has no known allergies.    Social History:  The patient  reports that he has been smoking Cigarettes.  He has a 3.00 pack-year smoking history. He has never used smokeless tobacco. He reports that he drinks alcohol. He reports that he does not use drugs.   Family History:  The patient's family history includes Breast cancer in his mother; Cancer in his mother and paternal grandfather; Cancer (age of onset: 25) in his father; Heart disease in his father; Pulmonary embolism in his mother.    ROS: All other systems are reviewed and negative. Unless  otherwise mentioned in H&P    PHYSICAL EXAM: VS:  BP 132/78   Pulse 98   Ht 6\' 2"  (1.88 m)   Wt 231 lb (104.8 kg)   SpO2 94%   BMI 29.66 kg/m  , BMI Body mass index is 29.66 kg/m. GEN: Well nourished, well developed, in no acute distress  HEENT: normal  Neck: no JVD, carotid bruits, or masses Cardiac:RRR;Tachycardic, no murmurs, rubs, or gallops,no edema  Respiratory:  clear to auscultation bilaterally, normal work of breathing GI: soft, nontender, nondistended, + BS MS: no deformity or atrophy  Skin: warm and dry, no rash Neuro:  Strength and sensation are intact Psych: euthymic mood, full affect   EKG:  Normal sinus rhythm with left anterior fascicular block, heart rate of 84 bpm. No ST-T wave abnormalities.  Recent Labs: 12/15/2015: ALT 16; BUN 13; Creatinine, Ser 0.79; Hemoglobin 14.1; Platelets 200; Potassium 4.1; Sodium 138    Lipid Panel    Component Value Date/Time   CHOL 123 12/20/2015 0822   TRIG 111 12/20/2015 [id number]  HDL 45 12/20/2015 0822   CHOLHDL 2.7 12/20/2015 0822   VLDL 22 12/20/2015 0822   LDLCALC 56 12/20/2015 0822      Wt Readings from Last 3 Encounters:  02/14/16 231 lb (104.8 kg)  01/25/16 233 lb (105.7 kg)  01/21/16 233 lb (105.7 kg)      Other studies Reviewed: Echocardiogram 2012-09-15 Left ventricle: The cavity size was normal. Systolic function was normal. The estimated ejection fraction was in the range of 55% to 60%. Wall motion was normal; there were no regional wall motion abnormalities. - Mitral valve: Calcified annulus. Mildly thickened leaflets . - Left atrium: The atrium was mildly dilated. - Atrial septum: No defect or patent foramen ovale was identified.  ASSESSMENT AND PLAN:  1. Abnormal EKG: New left anterior fascicular block. The patient is asymptomatic. Patient has undergone chemotherapy in the setting of colon cancer. I will order an echocardiogram to evaluate changes in LV systolic function. I do not  find a reason to proceed with a stress test as he is asymptomatic. If LV function is abnormal may consider ischemic workup.  2. CAD: Cardiac catheterization in 2011 revealed minimal, nonobstructive CAD. He continues to be asymptomatic. Would continue primary risk management. He will continue statin therapy. A repeating echo as discussed.  3. History of hypertension: Blood pressure is elevated today in the office. He is not on any antihypertensive medications at this time. We'll follow this. As he is quite anxious about "abnormal EKG". If blood pressure remains elevated may consider adding low-dose antihypertensive.  4. Colon cancer: Being followed by oncology. His undergone chemotherapy. Continues course per protocol.  Current medicines are reviewed at length with the patient today.    Labs/ tests ordered today include:   Orders Placed This Encounter  Procedures  . EKG 12-Lead  . ECHOCARDIOGRAM COMPLETE     Disposition:   FU with 2 weeks.  Signed, Jory Sims, NP  02/14/2016 4:38 PM    [facility] [address] [place], [place] [address] Phone: [phone]; Fax: [phone]

## 2016-02-14 NOTE — Patient Instructions (Signed)
[facility] at [facility] Discharge Instructions  RECOMMENDATIONS MADE BY THE CONSULTANT AND ANY TEST RESULTS WILL BE SENT TO YOUR REFERRING PHYSICIAN.  Received Vit B12 injection today. Follow-up as scheduled. Call clinic for any questions or concerns  Thank you for choosing [facility] at [facility] to provide your oncology and hematology care.  To afford each patient quality time with our provider, please arrive at least 15 minutes before your scheduled appointment time.    If you have a lab appointment with the Cancer Center please come in thru the  Main Entrance and check in at the main information desk  You need to re-schedule your appointment should you arrive 10 or more minutes late.  We strive to give you quality time with our providers, and arriving late affects you and other patients whose appointments are after yours.  Also, if you no show three or more times for appointments you may be dismissed from the clinic at the providers discretion.     Again, thank you for choosing [facility].  Our hope is that these requests will decrease the amount of time that you wait before being seen by our physicians.       _____________________________________________________________  Should you have questions after your visit to [facility], please contact our office at [phone] between the hours of 8:30 a.m. and 4:30 p.m.  Voicemails left after 4:30 p.m. will not be returned until the following business day.  For prescription refill requests, have your pharmacy contact our office.       Resources For Cancer Patients and their Caregivers ? American Cancer Society: Can assist with transportation, wigs, general needs, runs Look Good Feel Better.        1-888-227-6333 ? Cancer Care: Provides financial assistance, online support groups, medication/co-pay assistance.  1-800-813-HOPE (4673) ? [facility] Assists [place] cancer patients and their families through emotional , educational and financial support.  336-427-4357 ? [place] DSS Where to apply for food stamps, Medicaid and utility assistance. 336-342-1394 ? [facility]: Transportation to medical appointments. 336-347-2287 ? Social Security Administration: May apply for disability if have a Stage IV cancer. 336-342-7796 1-800-772-1213 ? [place] Aging, Disability and Transit Services: Assists with nutrition, care and transit needs. 336-349-2343  Cancer Center Support Programs: @10RELATIVEDAYS@ > Cancer Support Group  2nd Tuesday of the month 1pm-2pm, Journey Room  > Creative Journey  3rd Tuesday of the month 1130am-1pm, Journey Room  > Look Good Feel Better  1st Wednesday of the month 10am-12 noon, Journey Room (Call American Cancer Society to register 1-800-395-5775)   

## 2016-02-14 NOTE — Progress Notes (Signed)
Lynch Guckert Fosnaugh tolerated Vit B12 injection well without complaints or incident. VSS Pt discharged self ambulatory using cane in satisfactory condition

## 2016-02-15 ENCOUNTER — Ambulatory Visit (HOSPITAL_COMMUNITY)
Admission: RE | Admit: 2016-02-15 | Discharge: 2016-02-15 | Disposition: A | Discharge status: Home / Self Care | Payer: Medicaid Other | Source: Ambulatory Visit | Attending: Adult Health | Admitting: Adult Health

## 2016-02-15 DIAGNOSIS — I348 Other nonrheumatic mitral valve disorders: Secondary | ICD-10-CM | POA: Insufficient documentation

## 2016-02-15 DIAGNOSIS — Z09 Encounter for follow-up examination after completed treatment for conditions other than malignant neoplasm: Secondary | ICD-10-CM | POA: Diagnosis present

## 2016-02-15 DIAGNOSIS — I5020 Unspecified systolic (congestive) heart failure: Secondary | ICD-10-CM | POA: Insufficient documentation

## 2016-02-15 NOTE — Progress Notes (Signed)
*  PRELIMINARY RESULTS* Echocardiogram 2D Echocardiogram has been performed.  Samuel Germany 02/15/2016, 12:03 PM

## 2016-02-28 ENCOUNTER — Encounter: Payer: Self-pay | Admitting: Physician Assistant

## 2016-02-28 ENCOUNTER — Ambulatory Visit (INDEPENDENT_AMBULATORY_CARE_PROVIDER_SITE_OTHER): Payer: Medicaid Other | Admitting: Physician Assistant

## 2016-02-28 VITALS — BP 128/80 | HR 96 | Ht 73.0 in | Wt 231.0 lb

## 2016-02-28 DIAGNOSIS — E785 Hyperlipidemia, unspecified: Secondary | ICD-10-CM | POA: Diagnosis not present

## 2016-02-28 DIAGNOSIS — R9431 Abnormal electrocardiogram [ECG] [EKG]: Secondary | ICD-10-CM | POA: Diagnosis not present

## 2016-02-28 DIAGNOSIS — F17200 Nicotine dependence, unspecified, uncomplicated: Secondary | ICD-10-CM

## 2016-02-28 DIAGNOSIS — I951 Orthostatic hypotension: Secondary | ICD-10-CM | POA: Diagnosis not present

## 2016-02-28 NOTE — Progress Notes (Signed)
Cardiology Office Note    Date:  02/28/2016   ID:  [name], Erik [birth year], MRN [id number]  PCP:  [name], PA-C  Cardiologist: Dr. Harl Bowie   Chief Complaint  Patient presents with  . Follow-up    History of Present Illness:  Erik [name] is a 61 y.o. male  with history of syncope, orthostatic hypotension treated with FlorinefIn the past and colon cancer. Cardiac cath in 2011 revealed nonobstructive CAD with normal LV function.  Patient was noted to have an abnormal EKG prior to colonoscopy. It showed left anterior fascicular block which was new. Patient is asymptomatic. Patient saw Erik Sims, NP who ordered a 2-D echo as the patient had undergone chemotherapy for colon cancer. 2-D echo showed normal LV function ejection fraction 55%, mild LVH RV systolic function was mildly reduced. Patient was also hypertensive that day but no medications were started Because he was anxious.  Patient comes in today for follow-up. He denies any cardiac complaints. No chest pain, palpitations, dyspnea, dyspnea on exertion, dizziness or presyncope. I reviewed his 2-D echo with him and he was pleased. He says he smokes on occasion.   Past Medical History:  Diagnosis Date  . Abnormal stress echocardiogram   . Adenocarcinoma of transverse colon ([place]) 02/10/2015  . Alcohol abuse    Heavy Use up until 2010  . Anxiety   . Depression   . GERD (gastroesophageal reflux disease)   . Head trauma 2001   closed head injury; coma for 4 weeks  . Hypercholesterolemia   . Hypertension   . PTSD (post-traumatic stress disorder)     Past Surgical History:  Procedure Laterality Date  . BIOPSY  01/25/2016   Procedure: BIOPSY;  Surgeon: Danie Binder, MD;  [facility];  Service: Endoscopy;;  ileum;   . cardiac cath    . COLONOSCOPY  2011   Dr. Oneida Alar: multiple adenomas and hyperplastic polyps  . COLONOSCOPY WITH PROPOFOL N/A 01/12/2015   Procedure: COLONOSCOPY WITH  PROPOFOL;  Surgeon: Danie Binder, MD;  [facility];  Service: Endoscopy;  Laterality: N/A;  1030  . COLONOSCOPY WITH PROPOFOL N/A 01/25/2016   Procedure: COLONOSCOPY WITH PROPOFOL;  Surgeon: Danie Binder, MD;  [facility];  Service: Endoscopy;  Laterality: N/A;  1230  . CRANIOTOMY  2001  . ESOPHAGOGASTRODUODENOSCOPY (EGD) WITH PROPOFOL N/A 01/12/2015   Procedure: ESOPHAGOGASTRODUODENOSCOPY (EGD) WITH PROPOFOL;  Surgeon: Danie Binder, MD;  [facility];  Service: Endoscopy;  Laterality: N/A;  . head injury surgery    . HERNIA REPAIR Right 2012   Inguinal- Forestine Na  . KIDNEY SURGERY     >30 years ago  . LAPAROSCOPIC RIGHT HEMI COLECTOMY Left 02/10/2015   Procedure: LAPAROSCOPIC THEN OPEN RIGHT HEMI COLECTOMY;  Surgeon: [name], MD;  Location: [facility];  Service: General;  Laterality: Left;  . POLYPECTOMY  01/25/2016   Procedure: POLYPECTOMY;  Surgeon: Danie Binder, MD;  [facility];  Service: Endoscopy;;  colon  . PORTACATH PLACEMENT N/A 03/24/2015   Procedure: INSERTION PORT-A-CATH;  Surgeon: Jules Husbands, MD;  Location: [facility];  Service: General;  Laterality: N/A;    Current Medications: Outpatient Medications Prior to Visit  Medication Sig Dispense Refill  . albuterol (PROVENTIL HFA;VENTOLIN HFA) 108 (90 BASE) MCG/ACT inhaler Inhale 2 puffs into the lungs every 6 (six) hours as needed for wheezing or shortness of breath. 1 Inhaler 1  . amitriptyline (ELAVIL) 25  MG tablet TAKE 1 TABLET BY MOUTH EVERY NIGHT AT BEDTIME. 30 tablet 1  . atorvastatin (LIPITOR) 20 MG tablet Take 1 tablet (20 mg total) by mouth daily. 90 tablet 1  . cyanocobalamin (,VITAMIN B-12,) 1000 MCG/ML injection Inject 1,000 mcg into the muscle every 30 (thirty) days.    Marland Kitchen dexlansoprazole (DEXILANT) 60 MG capsule Take 1 capsule (60 mg total) by mouth daily. 90 capsule 1  . FLUoxetine (PROZAC) 20 MG capsule TAKE 1 CAPSULE BY MOUTH 2 TIMES A DAY 60 capsule 1  .  gabapentin (NEURONTIN) 300 MG capsule Take 4 capsules (1,200 mg total) by mouth at bedtime. (Patient taking differently: Take 600-1,200 mg by mouth at bedtime. 600 mg in the morning and 1200 mg in the evening) 120 capsule 1  . HYDROcodone-acetaminophen (NORCO) 5-325 MG tablet Take 1 tablet by mouth every 6 (six) hours as needed for moderate pain. 60 tablet 0  . loperamide (IMODIUM) 2 MG capsule TAKE 1 TO 2 CAPSULES BY MOUTH EVERY 6 HOURS AS NEEDED FOR DIARRHEA OR LOOSE STOOLS. 60 capsule 1   Facility-Administered Medications Prior to Visit  Medication Dose Route Frequency Provider Last Rate Last Dose  . sodium chloride flush (NS) 0.9 % injection 10 mL  10 mL Intracatheter PRN Patrici Ranks, MD   10 mL at 08/23/15 1030     Allergies:   Patient has no known allergies.   Social History   Social History  . Marital status: Divorced    Spouse name: N/A  . Number of children: N/A  . Years of education: N/A   Occupational History  . Full time    Social History Main Topics  . Smoking status: Current Some Day Smoker    Packs/day: 0.10    Years: 30.00    Types: Cigarettes  . Smokeless tobacco: Never Used     Comment: smokes every 2-3 days, 2-3 a day when smokes  . Alcohol use 0.0 oz/week     Comment: beer occ, history of ETOH abuse in remote past.   . Drug use: No     Comment: marijuana last 2012  . Sexual activity: No   Other Topics Concern  . None   Social History Narrative   Single     Family History:  The patient's   family history includes Breast cancer in his mother; Cancer in his mother and paternal grandfather; Cancer (age of onset: 87) in his father; Heart disease in his father; Pulmonary embolism in his mother.   ROS:   Please see the history of present illness.    Review of Systems  Constitution: Negative.  HENT: Negative.   Eyes: Positive for visual disturbance.  Cardiovascular: Negative.   Respiratory: Negative.   Endocrine: Negative.     Hematologic/Lymphatic: Negative.   Musculoskeletal: Negative.   Gastrointestinal: Positive for abdominal pain and diarrhea.  Genitourinary: Negative.   Neurological: Positive for dizziness and loss of balance.  Psychiatric/Behavioral: The patient is nervous/anxious.    All other systems reviewed and are negative.   PHYSICAL EXAM:   VS:  BP 128/80   Pulse 96   Ht 6\' 1"  (1.854 m)   Wt 231 lb (104.8 kg)   SpO2 99%   BMI 30.48 kg/m   Physical Exam  GEN: Well nourished, well developed, in no acute distress Neck: no JVD, carotid bruits, or masses Cardiac:RRR; positive S4, 1/6 systolic murmur at the left sternal border  Respiratory: Decreased breath sounds but clear to auscultation bilaterally, normal work of  breathing GI: soft, nontender, nondistended, + BS Ext: without cyanosis, clubbing, or edema, Good distal pulses bilaterally Psych: euthymic mood, full affect  Wt Readings from Last 3 Encounters:  02/28/16 231 lb (104.8 kg)  02/14/16 231 lb (104.8 kg)  01/25/16 233 lb (105.7 kg)      Studies/Labs Reviewed:   EKG:  EKG is not ordered today.    Recent Labs: 12/15/2015: ALT 16; BUN 13; Creatinine, Ser 0.79; Hemoglobin 14.1; Platelets 200; Potassium 4.1; Sodium 138   Lipid Panel    Component Value Date/Time   CHOL 123 12/20/2015 0822   TRIG 111 12/20/2015 0822   HDL 45 12/20/2015 0822   CHOLHDL 2.7 12/20/2015 0822   VLDL 22 12/20/2015 0822   LDLCALC 56 12/20/2015 0822    Additional studies/ records that were reviewed today include:  2-D echo 02/15/16 Study Conclusions   - Left ventricle: The cavity size was normal. Wall thickness was   increased in a pattern of mild LVH. Systolic function was normal.   The estimated ejection fraction was 55%. Wall motion was normal;   there were no regional wall motion abnormalities. - Mitral valve: Mildly calcified annulus. - Right ventricle: Systolic function was mildly reduced. Echocardiogram 08/29/2012 Left ventricle: The  cavity size was normal. Systolic   function was normal. The estimated ejection fraction was   in the range of 55% to 60%. Wall motion was normal; there   were no regional wall motion abnormalities. - Mitral valve: Calcified annulus. Mildly thickened leaflets   . - Left atrium: The atrium was mildly dilated. - Atrial septum: No defect or patent foramen ovale was   identified. Cardiac Cath ASSESSMENT:03/2009 1. Minimal nonobstructive coronary artery disease. 2. Normal left ventricular function. 3. Normal renal arteries.  No evidence of abdominal aortic aneurysm.      ASSESSMENT:    1. Orthostatic syncope   2. TOBACCO USER   3. Hyperlipidemia, unspecified hyperlipidemia type   4. Abnormal EKG      PLAN:  In order of problems listed above:  Orthostatic syncope has been stable for a while. No longer using Florinef.  Tobacco user says he doesn't smoke every day but only when he is stressed. Smoking cessation discussed.  Hyperlipidemia patient has been on Lipitor but ran out of all his medications 2 weeks ago. He is to resume this.  Abnormal EKG with left anterior fascicular block asymptomatic. 2-D echo shows normal LV function. No further workup. Follow-up with Dr. branch in 6 months.       Medication Adjustments/Labs and Tests Ordered: Current medicines are reviewed at length with the patient today.  Concerns regarding medicines are outlined above.  Medication changes, Labs and Tests ordered today are listed in the Patient Instructions below. Patient Instructions  Medication Instructions:  Your physician recommends that you continue on your current medications as directed. Please refer to the Current Medication list given to you today.   Labwork: NONE  Testing/Procedures: NONE  Follow-Up: Your physician wants you to follow-up in: 6 MONTHS .  You will receive a reminder letter in the mail two months in advance. If you don't receive a letter, please call our office to  schedule the follow-up appointment.   Any Other Special Instructions Will Be Listed Below (If Applicable).     If you need a refill on your cardiac medications before your next appointment, please call your pharmacy.      Signed, Ermalinda Barrios, PA-C  02/28/2016 2:09 PM    [facility] [address], [place], [place]  [address] Phone: [phone]; Fax: [phone]

## 2016-02-28 NOTE — Patient Instructions (Signed)

## 2016-03-01 ENCOUNTER — Other Ambulatory Visit: Payer: Self-pay | Admitting: Physician Assistant

## 2016-03-08 ENCOUNTER — Telehealth: Payer: Self-pay

## 2016-03-08 NOTE — Telephone Encounter (Signed)
Client needed assistance with [employer] bus pass for upcoming medical and mental health appointments in town. 10 ride [employer] pass provided for client by the [facility] program. Will follow as needed.

## 2016-03-16 ENCOUNTER — Encounter (HOSPITAL_COMMUNITY): Payer: Medicaid Other

## 2016-03-16 ENCOUNTER — Encounter (HOSPITAL_COMMUNITY): Payer: Medicaid Other | Attending: Oncology | Admitting: Oncology

## 2016-03-16 ENCOUNTER — Encounter (HOSPITAL_COMMUNITY): Payer: Self-pay | Admitting: Oncology

## 2016-03-16 ENCOUNTER — Encounter (HOSPITAL_BASED_OUTPATIENT_CLINIC_OR_DEPARTMENT_OTHER): Payer: Medicaid Other

## 2016-03-16 DIAGNOSIS — F4310 Post-traumatic stress disorder, unspecified: Secondary | ICD-10-CM | POA: Insufficient documentation

## 2016-03-16 DIAGNOSIS — Z801 Family history of malignant neoplasm of trachea, bronchus and lung: Secondary | ICD-10-CM | POA: Insufficient documentation

## 2016-03-16 DIAGNOSIS — F17210 Nicotine dependence, cigarettes, uncomplicated: Secondary | ICD-10-CM | POA: Diagnosis not present

## 2016-03-16 DIAGNOSIS — Z803 Family history of malignant neoplasm of breast: Secondary | ICD-10-CM | POA: Insufficient documentation

## 2016-03-16 DIAGNOSIS — Z8249 Family history of ischemic heart disease and other diseases of the circulatory system: Secondary | ICD-10-CM | POA: Insufficient documentation

## 2016-03-16 DIAGNOSIS — K219 Gastro-esophageal reflux disease without esophagitis: Secondary | ICD-10-CM | POA: Diagnosis not present

## 2016-03-16 DIAGNOSIS — C184 Malignant neoplasm of transverse colon: Secondary | ICD-10-CM

## 2016-03-16 DIAGNOSIS — Z95828 Presence of other vascular implants and grafts: Secondary | ICD-10-CM

## 2016-03-16 DIAGNOSIS — E7800 Pure hypercholesterolemia, unspecified: Secondary | ICD-10-CM | POA: Insufficient documentation

## 2016-03-16 DIAGNOSIS — G629 Polyneuropathy, unspecified: Secondary | ICD-10-CM | POA: Diagnosis not present

## 2016-03-16 DIAGNOSIS — F329 Major depressive disorder, single episode, unspecified: Secondary | ICD-10-CM | POA: Insufficient documentation

## 2016-03-16 DIAGNOSIS — E538 Deficiency of other specified B group vitamins: Secondary | ICD-10-CM | POA: Diagnosis not present

## 2016-03-16 DIAGNOSIS — Z800 Family history of malignant neoplasm of digestive organs: Secondary | ICD-10-CM | POA: Diagnosis not present

## 2016-03-16 DIAGNOSIS — Z9221 Personal history of antineoplastic chemotherapy: Secondary | ICD-10-CM | POA: Insufficient documentation

## 2016-03-16 DIAGNOSIS — Z9889 Other specified postprocedural states: Secondary | ICD-10-CM | POA: Insufficient documentation

## 2016-03-16 DIAGNOSIS — I10 Essential (primary) hypertension: Secondary | ICD-10-CM | POA: Diagnosis not present

## 2016-03-16 DIAGNOSIS — Z806 Family history of leukemia: Secondary | ICD-10-CM | POA: Diagnosis not present

## 2016-03-16 LAB — COMPREHENSIVE METABOLIC PANEL
ALBUMIN: 4.2 g/dL (ref 3.5–5.0)
ALK PHOS: 73 U/L (ref 38–126)
ALT: 14 U/L — ABNORMAL LOW (ref 17–63)
ANION GAP: 10 (ref 5–15)
AST: 20 U/L (ref 15–41)
BILIRUBIN TOTAL: 0.8 mg/dL (ref 0.3–1.2)
BUN: 11 mg/dL (ref 6–20)
CALCIUM: 9.1 mg/dL (ref 8.9–10.3)
CO2: 27 mmol/L (ref 22–32)
CREATININE: 0.92 mg/dL (ref 0.61–1.24)
Chloride: 101 mmol/L (ref 101–111)
GFR calc Af Amer: >60 mL/min (ref >60)
GFR calc non Af Amer: >60 mL/min (ref >60)
GLUCOSE: 84 mg/dL (ref 65–99)
Potassium: 3.9 mmol/L (ref 3.5–5.1)
Sodium: 138 mmol/L (ref 135–145)
TOTAL PROTEIN: 7.8 g/dL (ref 6.5–8.1)

## 2016-03-16 LAB — CBC WITH DIFFERENTIAL/PLATELET
BASOS PCT: 0 %
Basophils Absolute: 0 10*3/uL (ref 0.0–0.1)
Eosinophils Absolute: 0.1 10*3/uL (ref 0.0–0.7)
Eosinophils Relative: 2 %
HEMATOCRIT: 43.6 % (ref 39.0–52.0)
HEMOGLOBIN: 15.1 g/dL (ref 13.0–17.0)
LYMPHS ABS: 1.9 10*3/uL (ref 0.7–4.0)
Lymphocytes Relative: 25 %
MCH: 31.5 pg (ref 26.0–34.0)
MCHC: 34.6 g/dL (ref 30.0–36.0)
MCV: 91 fL (ref 78.0–100.0)
MONOS PCT: 7 %
Monocytes Absolute: 0.6 10*3/uL (ref 0.1–1.0)
NEUTROS ABS: 5.1 10*3/uL (ref 1.7–7.7)
NEUTROS PCT: 66 %
Platelets: 191 10*3/uL (ref 150–400)
RBC: 4.79 MIL/uL (ref 4.22–5.81)
RDW: 13.5 % (ref 11.5–15.5)
WBC: 7.7 10*3/uL (ref 4.0–10.5)

## 2016-03-16 LAB — FERRITIN: Ferritin: 102 ng/mL (ref 24–336)

## 2016-03-16 MED ORDER — CYANOCOBALAMIN 1000 MCG/ML IJ SOLN
1000.0000 ug | Freq: Once | INTRAMUSCULAR | Status: AC
  Administered 2016-03-16: 1000 ug via INTRAMUSCULAR

## 2016-03-16 MED ORDER — SODIUM CHLORIDE 0.9% FLUSH
10.0000 mL | INTRAVENOUS | Status: DC | PRN
  Administered 2016-03-16: 10 mL via INTRAVENOUS
  Filled 2016-03-16: qty 10

## 2016-03-16 MED ORDER — CYANOCOBALAMIN 1000 MCG/ML IJ SOLN
INTRAMUSCULAR | Status: AC
  Filled 2016-03-16: qty 1

## 2016-03-16 MED ORDER — HEPARIN SOD (PORK) LOCK FLUSH 100 UNIT/ML IV SOLN
500.0000 [IU] | Freq: Once | INTRAVENOUS | Status: AC
  Administered 2016-03-16: 500 [IU] via INTRAVENOUS
  Filled 2016-03-16: qty 5

## 2016-03-16 NOTE — Assessment & Plan Note (Signed)
B12 deficiency with level at 149 at time of diagnosis.  Continue B12 IM monthly.

## 2016-03-16 NOTE — Assessment & Plan Note (Addendum)
Stage IIIC [facility]) adenocarcinoma of transverse colon, S/P definitive surgery with a right extended hemicolectomy by Dr. [name].  S/P adjuvant chemotherapy consisting of FOLFOX 03/21/2015- 09/06/2015.  Oncology history is updated.  He is up to date on colonoscopy with last one being with Dr. Oneida Alar in December 2017.  Labs today: CBC diff, CMET, CEA, ferritin.  I personally reviewed and went over laboratory results with the patient.  The results are noted within this dictation.  Labs in 3 months: CBC diff, CMET, CEA, ferritin.  I personally reviewed and went over radiographic studies with the patient.  The results are noted within this dictation.  I reviewed his CT CAP from 10/20/2015.  This test confirmed remission of disease.    Repeat CT abd/pelvis w contrast in September 2018 for ongoing annual surveillance.  Given his pulmonary nodule, will repeat CT of chest wo contrast in March 2018 to confirm stability.  He continues to have ongoing issues with peripheral neuropathy, secondary to oxaliplatin.  It is grade 2.  Oxaliplatin was discontinued for his last cycle at which time 5FU/Leucovorin was given only for cycle #12.  He is provided education regarding oxaliplatin-induced peripheral neuropathy.   Return in 12 weeks for follow-up.

## 2016-03-16 NOTE — Progress Notes (Signed)
[name], PA-C [place] [address]  Adenocarcinoma of transverse colon [facility])  B12 deficiency  CURRENT THERAPY: FOLFOX beginning on 03/30/2015  INTERVAL HISTORY: Erik [name] 61 y.o. male returns for followup of Stage IIIC [facility]) adenocarcinoma of transverse colon.    Adenocarcinoma of transverse colon ([place])   01/12/2015 Pathologic Stage    Colon, biopsy, distal transverse - TUBULOVILLOUS ADENOMA WITH HIGH GRADE DYSPLASIA.      01/12/2015 Procedure    Colonoscopy by Dr. Oneida Alar.      01/12/2015 Tumor Marker    CEA: 6.6 (H)       01/18/2015 Imaging    CT abd/pelvis- Apple-core lesion identified in the mid transverse colon without obstruction. No evidence for lymphadenopathy in the gastrohepatic ligament or omentum.  Stable 8 mm hypo attenuating lesion in the left liver, likely a cyst.      02/10/2015 Initial Diagnosis    Adenocarcinoma of transverse colon ([place])      02/12/2015 Definitive Surgery    [name], Extended right hemicolectomy       02/12/2015 Pathology Results    Mucinous adenocarcinoma with penetration of visceral peritoneum, 4/19 lymph nodes for metastatic disease, negative resection margins, with LVI and perineural invasion      03/30/2015 - 09/06/2015 Chemotherapy    FOLFOX x 12 cycles      05/25/2015 Treatment Plan Change    5 FU bolus discontinued for cycle #5      06/08/2015 Treatment Plan Change    Treatment deferred x 1 week      06/15/2015 Treatment Plan Change    5FU CI decreased by 10% and Oxaliplatin reduced by 15% for cycles #6-#11; Oxaliplatin dropped for cycle #12 d/t neuropathy.       10/20/2015 Imaging    CT CAP- Right hemicolectomy without evidence of metastatic disease. 2. Previously measured ground-glass lesion in the left upper lobe has resolved. 3. Probable food debris in the stomach, simulating gastric wall thickening. Please correlate clinically. 4. 6 mm irregular nodular density in  the left upper lobe, stable. Continued attention on followup exams is warranted.      01/25/2016 Procedure    Colonoscopy by Dr. Oneida Alar- Non-thrombosed external hemorrhoids found on digital rectal exam. - One 4 mm polyp in the rectum, removed with a cold biopsy forceps. Resected and retrieved. - Congested mucosa in the neo-terminal ileum. Biopsied. - Redundant colon. - Internal hemorrhoids.      01/26/2016 Pathology Results    1. Terminal ileum, biopsy - MILD ACUTE (ACTIVE) ILEITIS. - NO DYSPLASIA OR MALIGNANCY IDENTIFIED - SEE COMMENT. 2. Rectum, polyp(s) - HYPERPLASTIC POLYP (X 1). - NO DYSPLASIA OR MALIGNANCY IDENTIFIED.      Peripheral neuropathy continues to be a major issue for him. He notes that it is not much improved.  He otherwise denies any complaints. He denies any issues with his bowels. He denies any blood in his stools or dark stools. He denies any change in bowel frequency. He does note that his bowels are becoming more formed. He does have a long-standing history of loose stools likely secondary to his colon surgery.  I reviewed the surveillance guidelines with him   Review of Systems  Constitutional: Negative.  Negative for chills, fever and weight loss.  HENT: Negative.   Eyes: Negative.   Respiratory: Negative.  Negative for cough.   Cardiovascular: Negative.  Negative for chest pain.  Gastrointestinal: Negative.  Negative for blood in stool,  constipation, melena, nausea and vomiting.  Genitourinary: Negative.  Negative for dysuria.  Musculoskeletal: Negative.  Negative for falls.  Skin: Negative.   Neurological: Positive for tingling and sensory change. Negative for weakness.  Endo/Heme/Allergies: Negative.   Psychiatric/Behavioral: Negative.     Past Medical History:  Diagnosis Date  . Abnormal stress echocardiogram   . Adenocarcinoma of transverse colon ([place]) 02/10/2015  . Alcohol abuse    Heavy Use up until 2010  . Anxiety   . Depression   .  GERD (gastroesophageal reflux disease)   . Head trauma 2001   closed head injury; coma for 4 weeks  . Hypercholesterolemia   . Hypertension   . PTSD (post-traumatic stress disorder)     Past Surgical History:  Procedure Laterality Date  . BIOPSY  01/25/2016   Procedure: BIOPSY;  Surgeon: Danie Binder, MD;  [facility];  Service: Endoscopy;;  ileum;   . cardiac cath    . COLONOSCOPY  2011   Dr. Oneida Alar: multiple adenomas and hyperplastic polyps  . COLONOSCOPY WITH PROPOFOL N/A 01/12/2015   Procedure: COLONOSCOPY WITH PROPOFOL;  Surgeon: Danie Binder, MD;  [facility];  Service: Endoscopy;  Laterality: N/A;  1030  . COLONOSCOPY WITH PROPOFOL N/A 01/25/2016   Procedure: COLONOSCOPY WITH PROPOFOL;  Surgeon: Danie Binder, MD;  [facility];  Service: Endoscopy;  Laterality: N/A;  1230  . CRANIOTOMY  2001  . ESOPHAGOGASTRODUODENOSCOPY (EGD) WITH PROPOFOL N/A 01/12/2015   Procedure: ESOPHAGOGASTRODUODENOSCOPY (EGD) WITH PROPOFOL;  Surgeon: Danie Binder, MD;  [facility];  Service: Endoscopy;  Laterality: N/A;  . head injury surgery    . HERNIA REPAIR Right 2012   Inguinal- Forestine Na  . KIDNEY SURGERY     >30 years ago  . LAPAROSCOPIC RIGHT HEMI COLECTOMY Left 02/10/2015   Procedure: LAPAROSCOPIC THEN OPEN RIGHT HEMI COLECTOMY;  Surgeon: [name], MD;  Location: [facility] OR[facility];  Service: General;  Laterality: Left;  . POLYPECTOMY  01/25/2016   Procedure: POLYPECTOMY;  Surgeon: Danie Binder, MD;  [facility];  Service: Endoscopy;;  colon  . PORTACATH PLACEMENT N/A 03/24/2015   Procedure: INSERTION PORT-A-CATH;  Surgeon: Jules Husbands, MD;  Location: [facility] ORS;  Service: General;  Laterality: N/A;    Family History  Problem Relation Age of Onset  . Pulmonary embolism Mother   . Breast cancer Mother   . Cancer Mother   . Heart disease Father   . Cancer Father 52    Leukemia  . Cancer Paternal Grandfather     Lung  .  Ataxia Neg Hx   . Chorea Neg Hx   . Dementia Neg Hx   . Mental retardation Neg Hx   . Migraines Neg Hx   . Multiple sclerosis Neg Hx   . Neurofibromatosis Neg Hx   . Neuropathy Neg Hx   . Parkinsonism Neg Hx   . Seizures Neg Hx   . Stroke Neg Hx   . Colon cancer Neg Hx     Social History   Social History  . Marital status: Divorced    Spouse name: N/A  . Number of children: N/A  . Years of education: N/A   Occupational History  . Full time    Social History Main Topics  . Smoking status: Current Some Day Smoker    Packs/day: 0.10    Years: 30.00    Types: Cigarettes  . Smokeless tobacco: Never Used  Comment: smokes every 2-3 days, 2-3 a day when smokes  . Alcohol use 0.0 oz/week     Comment: beer occ, history of ETOH abuse in remote past.   . Drug use: No     Comment: marijuana last 2012  . Sexual activity: No   Other Topics Concern  . None   Social History Narrative   Single     PHYSICAL EXAMINATION  ECOG PERFORMANCE STATUS: 1 - Symptomatic but completely ambulatory  Vitals:   03/16/16 1400  BP: 128/72  Pulse: 98  Resp: 18  Temp: 97.5 F (36.4 C)     GENERAL:alert, no distress, well nourished, well developed, comfortable, cooperative, smiling and unaccompanied. SKIN: skin color, texture, turgor are normal, no rashes or significant lesions HEAD: Normocephalic, No masses, lesions, tenderness or abnormalities EYES: normal, EOMI, Conjunctiva are pink and non-injected EARS: External ears normal OROPHARYNX:lips, buccal mucosa, and tongue normal and mucous membranes are moist  NECK: supple, trachea midline LYMPH:  not examined BREAST:not examined LUNGS: clear to auscultation and percussion HEART: regular rate & rhythm, no murmurs, no gallops, S1 normal and S2 normal ABDOMEN:abdomen soft and normal bowel sounds BACK: Back symmetric, no curvature. EXTREMITIES:less then 2 second capillary refill, no joint deformities, effusion, or inflammation, no skin  discoloration, no cyanosis  NEURO: alert & oriented x 3 with fluent speech, no focal motor/sensory deficits, gait normal   LABORATORY DATA: CBC    Component Value Date/Time   WBC 7.7 03/16/2016 1255   RBC 4.79 03/16/2016 1255   HGB 15.1 03/16/2016 1255   HCT 43.6 03/16/2016 1255   PLT 191 03/16/2016 1255   MCV 91.0 03/16/2016 1255   MCH 31.5 03/16/2016 1255   MCHC 34.6 03/16/2016 1255   RDW 13.5 03/16/2016 1255   LYMPHSABS 1.9 03/16/2016 1255   MONOABS 0.6 03/16/2016 1255   EOSABS 0.1 03/16/2016 1255   BASOSABS 0.0 03/16/2016 1255      Chemistry      Component Value Date/Time   NA 138 03/16/2016 1255   K 3.9 03/16/2016 1255   CL 101 03/16/2016 1255   CO2 27 03/16/2016 1255   BUN 11 03/16/2016 1255   CREATININE 0.92 03/16/2016 1255   CREATININE 0.86 12/16/2014 0844      Component Value Date/Time   CALCIUM 9.1 03/16/2016 1255   ALKPHOS 73 03/16/2016 1255   AST 20 03/16/2016 1255   ALT 14 (L) 03/16/2016 1255   BILITOT 0.8 03/16/2016 1255     Lab Results  Component Value Date   CEA 2.1 12/15/2015   Lab Results  Component Value Date   IRON 45 03/09/2015   TIBC 361 03/09/2015   FERRITIN 52 03/09/2015     PENDING LABS:   RADIOGRAPHIC STUDIES:  No results found.   PATHOLOGY:    ASSESSMENT AND PLAN:  Adenocarcinoma of transverse colon ([place]) Stage IIIC [id number]) adenocarcinoma of transverse colon, S/P definitive surgery with a right extended hemicolectomy by Dr. [name].  S/P adjuvant chemotherapy consisting of FOLFOX 03/21/2015- 09/06/2015.  Oncology history is updated.  He is up to date on colonoscopy with last one being with Dr. Oneida Alar in December 2017.  Labs today: CBC diff, CMET, CEA, ferritin.  I personally reviewed and went over laboratory results with the patient.  The results are noted within this dictation.  Labs in 3 months: CBC diff, CMET, CEA, ferritin.  I personally reviewed and went over radiographic studies with the patient.   The results are noted within this dictation.  I  reviewed his CT CAP from 10/20/2015.  This test confirmed remission of disease.    Repeat CT abd/pelvis w contrast in September 2018 for ongoing annual surveillance.  Given his pulmonary nodule, will repeat CT of chest wo contrast in March 2018 to confirm stability.  He continues to have ongoing issues with peripheral neuropathy, secondary to oxaliplatin.  It is grade 2.  Oxaliplatin was discontinued for his last cycle at which time 5FU/Leucovorin was given only for cycle #12.  He is provided education regarding oxaliplatin-induced peripheral neuropathy.   Return in 12 weeks for follow-up.    B12 deficiency B12 deficiency with level at 149 at time of diagnosis.  Continue B12 IM monthly.   ORDERS PLACED FOR THIS ENCOUNTER: No orders of the defined types were placed in this encounter.   MEDICATIONS PRESCRIBED THIS ENCOUNTER: No orders of the defined types were placed in this encounter.   THERAPY PLAN:  Continue monthly B12 injections.  NCCN guidelines for surveillance for Colon cancer are as follows (1.2017):  A. Stage I   1. Colonoscopy at year 1    A. If advanced adenoma, repeat in 1 year    B. If no advanced adenoma, repeat in 3 years, and then every 5 years.  B. Stage II, Stage III   1. H+P every 3-6 months x 2 years and then every 6 months for a total of 5 years    2. CEA every 3-6 months x 2 years and then every 6 months for a total of 5 years    3. CT CAP every 6-12 months (category 2B for frequency < 12 months) for a total of 5 years .   4.  Colonoscopy in 1 year except if no preoperative colonoscopy due to obstructing lesion, colonoscopy in 3-6 months.     A. If advanced adenoma, repeat in 1 year    B. If no advanced adenoma, repeat in 3 years, then every 5 years   5. PET/CT scan is not recommended.  C. Stage IV   1. H+P every 3-6 months x 2 years and then every 6 months for a total of 5 years    2. CEA every 3 months x 2  years and then every 6 months for a total of 3- 5 years    3. CT CAP every 3-6 months (category 2B for frequency < 6 months) x 2 years., then every 6-12 months for a total of 5 years .   4. Colonoscopy in 1 year except if no preoperative colonoscopy due to obstructing lesion, colonoscopy in 3-6 months.     A. If advanced adenoma, repeat in 1 year    B. If no advanced adenoma, repeat in 3 years, then every 5 years   All questions were answered. The patient knows to call the clinic with any problems, questions or concerns. We can certainly see the patient much sooner if necessary.  Patient and plan discussed with Dr. Twana First and she is in agreement with the aforementioned.   This note is electronically signed by: Doy Mince 03/16/2016 7:07 PM

## 2016-03-16 NOTE — Patient Instructions (Addendum)
[place] at [facility] Discharge Instructions  RECOMMENDATIONS MADE BY THE CONSULTANT AND ANY TEST RESULTS WILL BE SENT TO YOUR REFERRING PHYSICIAN.  You were seen today by Manning Charity B-12 injections monthly Port flush every 6 weeks Port flush Lab work and follow up in 3 months CT scan in 3 months  See Amy up front for appointments   Thank you for choosing [place] at [facility] to provide your oncology and hematology care.  To afford each patient quality time with our provider, please arrive at least 15 minutes before your scheduled appointment time.    If you have a lab appointment with the [facility] please come in thru the  Main Entrance and check in at the main information desk  You need to re-schedule your appointment should you arrive 10 or more minutes late.  We strive to give you quality time with our providers, and arriving late affects you and other patients whose appointments are after yours.  Also, if you no show three or more times for appointments you may be dismissed from the clinic at the providers discretion.     Again, thank you for choosing [facility].  Our hope is that these requests will decrease the amount of time that you wait before being seen by our physicians.       _____________________________________________________________  Should you have questions after your visit to [facility] please contact our office at [phone] between the hours of 8:30 a.m. and 4:30 p.m.  Voicemails left after 4:30 p.m. will not be returned until the following business day.  For prescription refill requests, have your pharmacy contact our office.       Resources For Cancer Patients and their Caregivers ? American Cancer Society: Can assist with transportation, wigs, general needs, runs Look Good Feel Better.        365-019-8935 ? Cancer Care: Provides financial assistance, online  support groups, medication/co-pay assistance.  1-800-813-HOPE 3105727774) ? [place] Assists [place] cancer patients and their families through emotional , educational and financial support.  530-471-4739 ? [place] DSS Where to apply for food stamps, Medicaid and utility assistance. 828-851-2194 ? [employer]ATS: Transportation to medical appointments. (609)817-7863 ? Social Security Administration: May apply for disability if have a Stage IV cancer. 226-075-7935 734-074-7593 ? [employer], Disability and Transit Services: Assists with nutrition, care and transit needs. [place] Support Programs: @10RELATIVEDAYS @ > Cancer Support Group  2nd Tuesday of the month 1pm-2pm, Journey Room  > Creative Journey  3rd Tuesday of the month 1130am-1pm, Journey Room  > Look Good Feel Better  1st Wednesday of the month 10am-12 noon, Journey Room (Call [place] to register [phone])

## 2016-03-17 LAB — CEA: CEA: 2.4 ng/mL (ref 0.0–4.7)

## 2016-03-17 NOTE — Patient Instructions (Signed)
[place] at [facility] Discharge Instructions  RECOMMENDATIONS MADE BY THE CONSULTANT AND ANY TEST RESULTS WILL BE SENT TO YOUR REFERRING PHYSICIAN.  B12 injection, port flush done Follow up as scheduled.  Thank you for choosing [place] at [facility] to provide your oncology and hematology care.  To afford each patient quality time with our provider, please arrive at least 15 minutes before your scheduled appointment time.    If you have a lab appointment with the [place] [facility] please come in thru the  Main Entrance and check in at the main information desk  You need to re-schedule your appointment should you arrive 10 or more minutes late.  We strive to give you quality time with our providers, and arriving late affects you and other patients whose appointments are after yours.  Also, if you no show three or more times for appointments you may be dismissed from the clinic at the providers discretion.     Again, thank you for choosing [facility].  Our hope is that these requests will decrease the amount of time that you wait before being seen by our physicians.       _____________________________________________________________  Should you have questions after your visit to [facility], please contact our office at [phone] between the hours of 8:30 a.m. and 4:30 p.m.  Voicemails left after 4:30 p.m. will not be returned until the following business day.  For prescription refill requests, have your pharmacy contact our office.       Resources For Cancer Patients and their Caregivers ? American Cancer Society: Can assist with transportation, wigs, general needs, runs Look Good Feel Better.        906-562-5743 ? Cancer Care: Provides financial assistance, online support groups, medication/co-pay assistance.  1-800-813-HOPE 417 525 9178) ? [place] Assists [place] cancer  patients and their families through emotional , educational and financial support.  929-667-5509 ? [place] DSS Where to apply for food stamps, Medicaid and utility assistance. 272-785-6570 ? [employer]: Transportation to medical appointments. (251)632-1434 ? Social Security Administration: May apply for disability if have a Stage IV cancer. 978 833 2127 773-485-9209 ? [employer], Disability and Transit Services: Assists with nutrition, care and transit needs. [place] Support Programs: @10RELATIVEDAYS @ > Cancer Support Group  2nd Tuesday of the month 1pm-2pm, Journey Room  > Creative Journey  3rd Tuesday of the month 1130am-1pm, Journey Room  > Look Good Feel Better  1st Wednesday of the month 10am-12 noon, Journey Room (Call [place] to register [phone])

## 2016-03-17 NOTE — Progress Notes (Signed)
Erik [name] presents today for injection per MD orders. B12 1,000mg  administered IM in right Upper Arm. Administration without incident. Patient tolerated well.  Erik [name] presented for Portacath access and flush. Portacath located right chest wall accessed with  H 20 needle. Good blood return present. Portacath flushed with 17ml NS and 500U/88ml Heparin and needle removed intact. Procedure without incident. Patient tolerated procedure well.

## 2016-03-22 ENCOUNTER — Ambulatory Visit: Payer: Self-pay | Admitting: Physician Assistant

## 2016-03-22 ENCOUNTER — Encounter: Payer: Self-pay | Admitting: Physician Assistant

## 2016-03-22 VITALS — BP 102/64 | HR 87 | Temp 97.9°F | Ht 73.0 in | Wt 231.0 lb

## 2016-03-22 DIAGNOSIS — E785 Hyperlipidemia, unspecified: Secondary | ICD-10-CM

## 2016-03-22 DIAGNOSIS — I2510 Atherosclerotic heart disease of native coronary artery without angina pectoris: Secondary | ICD-10-CM

## 2016-03-22 DIAGNOSIS — C184 Malignant neoplasm of transverse colon: Secondary | ICD-10-CM

## 2016-03-22 DIAGNOSIS — K219 Gastro-esophageal reflux disease without esophagitis: Secondary | ICD-10-CM

## 2016-03-22 DIAGNOSIS — F32A Depression, unspecified: Secondary | ICD-10-CM

## 2016-03-22 DIAGNOSIS — F17219 Nicotine dependence, cigarettes, with unspecified nicotine-induced disorders: Secondary | ICD-10-CM

## 2016-03-22 DIAGNOSIS — J449 Chronic obstructive pulmonary disease, unspecified: Secondary | ICD-10-CM

## 2016-03-22 DIAGNOSIS — F329 Major depressive disorder, single episode, unspecified: Secondary | ICD-10-CM

## 2016-03-22 NOTE — Progress Notes (Signed)
BP 102/64 (BP Location: Left Arm, Patient Position: Sitting, Cuff Size: Normal)   Pulse 87   Temp 97.9 F (36.6 C) (Other (Comment))   Ht 6\' 1"  (1.854 m)   Wt 231 lb (104.8 kg)   SpO2 95%   BMI 30.48 kg/m    Subjective:    Patient ID: Erik [name], male    DOB: 11-10-1955, 61 y.o.   MRN: [id number]  HPI: DINNIE DUGGIN is a 61 y.o. male presenting on 03/22/2016 for Hyperlipidemia   HPI   Pt says he is doing allright except he just doesn't feel too good today.    Pt still smoking some.  Pt still drinks etoh some times.   Pt still going to oncology  Pt still going to cardiology  Pt was GI recently for repeat colonscopy  Pt still active with neurology- has f/u there in November   Relevant past medical, surgical, family and social history reviewed and updated as indicated. Interim medical history since our last visit reviewed. Allergies and medications reviewed and updated.   Current Outpatient Prescriptions:  .  amitriptyline (ELAVIL) 25 MG tablet, TAKE 1 TABLET BY MOUTH EVERY NIGHT AT BEDTIME., Disp: 30 tablet, Rfl: 1 .  atorvastatin (LIPITOR) 20 MG tablet, Take 1 tablet (20 mg total) by mouth daily., Disp: 90 tablet, Rfl: 1 .  cyanocobalamin (,VITAMIN B-12,) 1000 MCG/ML injection, Inject 1,000 mcg into the muscle every 30 (thirty) days., Disp: , Rfl:  .  dexlansoprazole (DEXILANT) 60 MG capsule, Take 1 capsule (60 mg total) by mouth daily., Disp: 90 capsule, Rfl: 1 .  FLUoxetine (PROZAC) 20 MG capsule, TAKE 1 CAPSULE BY MOUTH 2 TIMES A DAY, Disp: 60 capsule, Rfl: 1 .  gabapentin (NEURONTIN) 300 MG capsule, Take 4 capsules (1,200 mg total) by mouth at bedtime. (Patient taking differently: Take 600-1,200 mg by mouth at bedtime. 300 mg in the morning and 900 mg in the evening), Disp: 120 capsule, Rfl: 1 .  HYDROcodone-acetaminophen (NORCO) 5-325 MG tablet, Take 1 tablet by mouth every 6 (six) hours as needed for moderate pain., Disp: 60 tablet, Rfl: 0 .  loperamide  (IMODIUM) 2 MG capsule, TAKE 1 TO 2 CAPSULES BY MOUTH EVERY 6 HOURS AS NEEDED FOR DIARRHEA OR LOOSE STOOLS., Disp: 60 capsule, Rfl: 1 .  PROAIR HFA 108 (90 Base) MCG/ACT inhaler, INHALE 2 PUFFS BY MOUTH EVERY 6 HOURS AS NEEDED FOR SHORTNESS OF BREATH/WHEEZING, Disp: 8.5 each, Rfl: 5 No current facility-administered medications for this visit.   Facility-Administered Medications Ordered in Other Visits:  .  sodium chloride flush (NS) 0.9 % injection 10 mL, 10 mL, Intracatheter, PRN, Patrici Ranks, MD, 10 mL at 08/23/15 1030   Review of Systems  Constitutional: Positive for chills, diaphoresis and fatigue. Negative for appetite change, fever and unexpected weight change.  HENT: Negative for congestion, drooling, ear pain, facial swelling, hearing loss, mouth sores, sneezing, sore throat, trouble swallowing and voice change.   Eyes: Negative for pain, discharge, redness, itching and visual disturbance.  Respiratory: Positive for cough and shortness of breath. Negative for choking and wheezing.   Cardiovascular: Negative for chest pain, palpitations and leg swelling.  Gastrointestinal: Positive for abdominal pain and diarrhea. Negative for blood in stool, constipation and vomiting.  Endocrine: Positive for cold intolerance. Negative for heat intolerance and polydipsia.  Genitourinary: Negative for decreased urine volume, dysuria and hematuria.  Musculoskeletal: Positive for arthralgias and gait problem. Negative for back pain.  Skin: Negative for rash.  Allergic/Immunologic: Negative  for environmental allergies.  Neurological: Positive for light-headedness. Negative for seizures, syncope and headaches.  Hematological: Negative for adenopathy.  Psychiatric/Behavioral: Positive for dysphoric mood. Negative for agitation and suicidal ideas. The patient is nervous/anxious.     Per HPI unless specifically indicated above     Objective:    BP 102/64 (BP Location: Left Arm, Patient Position:  Sitting, Cuff Size: Normal)   Pulse 87   Temp 97.9 F (36.6 C) (Other (Comment))   Ht 6\' 1"  (1.854 m)   Wt 231 lb (104.8 kg)   SpO2 95%   BMI 30.48 kg/m   Wt Readings from Last 3 Encounters:  03/22/16 231 lb (104.8 kg)  03/16/16 231 lb 9.6 oz (105.1 kg)  02/28/16 231 lb (104.8 kg)    Physical Exam  Constitutional: He is oriented to person, place, and time. He appears well-developed and well-nourished.  HENT:  Head: Normocephalic and atraumatic.  Neck: Neck supple.  Cardiovascular: Normal rate and regular rhythm.   Pulmonary/Chest: Effort normal and breath sounds normal. He has no wheezes.  Abdominal: Soft. Bowel sounds are normal. There is no hepatosplenomegaly. There is no tenderness.  Musculoskeletal: He exhibits no edema.  Lymphadenopathy:    He has no cervical adenopathy.  Neurological: He is alert and oriented to person, place, and time.  Skin: Skin is warm and dry.  Psychiatric: He has a normal mood and affect. His behavior is normal.  Vitals reviewed.   Results for orders placed or performed in visit on 03/16/16  CBC with Differential  Result Value Ref Range   WBC 7.7 4.0 - 10.5 K/uL   RBC 4.79 4.22 - 5.81 MIL/uL   Hemoglobin 15.1 13.0 - 17.0 g/dL   HCT 43.6 39.0 - 52.0 %   MCV 91.0 78.0 - 100.0 fL   MCH 31.5 26.0 - 34.0 pg   MCHC 34.6 30.0 - 36.0 g/dL   RDW 13.5 11.5 - 15.5 %   Platelets 191 150 - 400 K/uL   Neutrophils Relative % 66 %   Neutro Abs 5.1 1.7 - 7.7 K/uL   Lymphocytes Relative 25 %   Lymphs Abs 1.9 0.7 - 4.0 K/uL   Monocytes Relative 7 %   Monocytes Absolute 0.6 0.1 - 1.0 K/uL   Eosinophils Relative 2 %   Eosinophils Absolute 0.1 0.0 - 0.7 K/uL   Basophils Relative 0 %   Basophils Absolute 0.0 0.0 - 0.1 K/uL  Comprehensive metabolic panel  Result Value Ref Range   Sodium 138 135 - 145 mmol/L   Potassium 3.9 3.5 - 5.1 mmol/L   Chloride 101 101 - 111 mmol/L   CO2 27 22 - 32 mmol/L   Glucose, Bld 84 65 - 99 mg/dL   BUN 11 6 - 20 mg/dL    Creatinine, Ser 0.92 0.61 - 1.24 mg/dL   Calcium 9.1 8.9 - 10.3 mg/dL   Total Protein 7.8 6.5 - 8.1 g/dL   Albumin 4.2 3.5 - 5.0 g/dL   AST 20 15 - 41 U/L   ALT 14 (L) 17 - 63 U/L   Alkaline Phosphatase 73 38 - 126 U/L   Total Bilirubin 0.8 0.3 - 1.2 mg/dL   GFR calc non Af Amer >60 >60 mL/min   GFR calc Af Amer >60 >60 mL/min   Anion gap 10 5 - 15  CEA  Result Value Ref Range   CEA 2.4 0.0 - 4.7 ng/mL  Ferritin  Result Value Ref Range   Ferritin 102 24 - 336  ng/mL      Assessment & Plan:   Encounter Diagnoses  Name Primary?  . Hyperlipidemia, unspecified hyperlipidemia type Yes  . Cigarette nicotine dependence with nicotine-induced disorder   . Chronic obstructive pulmonary disease, unspecified COPD type ([place])   . Adenocarcinoma of transverse colon ([place])   . Depression, unspecified depression type   . Gastroesophageal reflux disease, esophagitis presence not specified   . Atherosclerosis of native coronary artery of native heart without angina pectoris     -labs reviewed -Last lipid panel 12/20/15- pt just started lipitor last feb.  Need to check lipids. If okay can change checks to q 6 mo -discussed PSA testing with pt- will not test per agreement with pt -F/u 51months.  RTO sooner prn

## 2016-03-27 ENCOUNTER — Other Ambulatory Visit: Payer: Self-pay | Admitting: Physician Assistant

## 2016-03-27 ENCOUNTER — Other Ambulatory Visit (HOSPITAL_COMMUNITY): Payer: Self-pay | Admitting: Oncology

## 2016-03-27 DIAGNOSIS — C184 Malignant neoplasm of transverse colon: Secondary | ICD-10-CM

## 2016-03-29 ENCOUNTER — Other Ambulatory Visit (HOSPITAL_COMMUNITY): Payer: Self-pay

## 2016-03-29 DIAGNOSIS — T451X5A Adverse effect of antineoplastic and immunosuppressive drugs, initial encounter: Principal | ICD-10-CM

## 2016-03-29 DIAGNOSIS — G620 Drug-induced polyneuropathy: Secondary | ICD-10-CM

## 2016-03-29 MED ORDER — GABAPENTIN 300 MG PO CAPS: 1200.0000 mg | ORAL_CAPSULE | Freq: Every day | ORAL | 1 refills | Status: DC

## 2016-03-29 NOTE — Telephone Encounter (Signed)
Received refill request from patients pharmacy for gabapentin. Reviewed with PA, chart checked and refilled.

## 2016-04-13 ENCOUNTER — Ambulatory Visit (HOSPITAL_COMMUNITY)
Admission: RE | Admit: 2016-04-13 | Discharge: 2016-04-13 | Disposition: A | Discharge status: Home / Self Care | Payer: Medicaid Other | Source: Ambulatory Visit | Attending: Oncology | Admitting: Oncology

## 2016-04-13 ENCOUNTER — Encounter (HOSPITAL_COMMUNITY): Payer: Self-pay

## 2016-04-13 ENCOUNTER — Encounter (HOSPITAL_COMMUNITY): Payer: Medicaid Other | Attending: Hematology & Oncology

## 2016-04-13 VITALS — BP 139/84 | HR 84 | Temp 98.1°F | Resp 16

## 2016-04-13 DIAGNOSIS — F17219 Nicotine dependence, cigarettes, with unspecified nicotine-induced disorders: Secondary | ICD-10-CM | POA: Insufficient documentation

## 2016-04-13 DIAGNOSIS — R911 Solitary pulmonary nodule: Secondary | ICD-10-CM | POA: Insufficient documentation

## 2016-04-13 DIAGNOSIS — J439 Emphysema, unspecified: Secondary | ICD-10-CM | POA: Diagnosis not present

## 2016-04-13 DIAGNOSIS — E538 Deficiency of other specified B group vitamins: Secondary | ICD-10-CM

## 2016-04-13 DIAGNOSIS — C184 Malignant neoplasm of transverse colon: Secondary | ICD-10-CM | POA: Insufficient documentation

## 2016-04-13 MED ORDER — CYANOCOBALAMIN 1000 MCG/ML IJ SOLN
1000.0000 ug | Freq: Once | INTRAMUSCULAR | Status: AC
Start: 2016-04-13 — End: 2016-04-13
  Administered 2016-04-13: 1000 ug via INTRAMUSCULAR
  Filled 2016-04-13: qty 1

## 2016-04-13 NOTE — Patient Instructions (Signed)
[place] at [facility] Discharge Instructions  RECOMMENDATIONS MADE BY THE CONSULTANT AND ANY TEST RESULTS WILL BE SENT TO YOUR REFERRING PHYSICIAN.  You were given a B12 injection today. Continue Monthly B12 injections.   Thank you for choosing [place] at [facility] to provide your oncology and hematology care.  To afford each patient quality time with our provider, please arrive at least 15 minutes before your scheduled appointment time.    If you have a lab appointment with the [place] please come in thru the  Main Entrance and check in at the main information desk  You need to re-schedule your appointment should you arrive 10 or more minutes late.  We strive to give you quality time with our providers, and arriving late affects you and other patients whose appointments are after yours.  Also, if you no show three or more times for appointments you may be dismissed from the clinic at the providers discretion.     Again, thank you for choosing [facility].  Our hope is that these requests will decrease the amount of time that you wait before being seen by our physicians.       _____________________________________________________________  Should you have questions after your visit to [facility], please contact our office at [phone] between the hours of 8:30 a.m. and 4:30 p.m.  Voicemails left after 4:30 p.m. will not be returned until the following business day.  For prescription refill requests, have your pharmacy contact our office.       Resources For Cancer Patients and their Caregivers ? American Cancer Society: Can assist with transportation, wigs, general needs, runs Look Good Feel Better.        813-646-4156 ? Cancer Care: Provides financial assistance, online support groups, medication/co-pay assistance.  1-800-813-HOPE 6782967655) ? [place] Assists  [place] cancer patients and their families through emotional , educational and financial support.  [phone] ? [place] DSS Where to apply for food stamps, Medicaid and utility assistance. 727-180-8132 ? RCATS: Transportation to medical appointments. (845) 662-9979 ? Social Security Administration: May apply for disability if have a Stage IV cancer. (419) 536-2321 (217)124-9617 ? [employer], Disability and Transit Services: Assists with nutrition, care and transit needs. [place] Support Programs: @10RELATIVEDAYS @ > Cancer Support Group  2nd Tuesday of the month 1pm-2pm, Journey Room  > Creative Journey  3rd Tuesday of the month 1130am-1pm, Journey Room  > Look Good Feel Better  1st Wednesday of the month 10am-12 noon, Journey Room (Call [place] to register [phone])

## 2016-04-13 NOTE — Progress Notes (Signed)
Pt given B12 injection in right deltoid. Pt tolerated injection well. Pt stable and discharged home ambulatory.

## 2016-04-24 ENCOUNTER — Telehealth: Payer: Self-pay

## 2016-04-24 NOTE — Telephone Encounter (Signed)
Contacted client , He had notified RN that he needed skat bus pass in order to get to [facility] [address] [facility]. Client notified bus pass had been obtained and it can be picked up.

## 2016-04-27 ENCOUNTER — Encounter (HOSPITAL_COMMUNITY): Payer: Self-pay

## 2016-04-27 ENCOUNTER — Encounter (HOSPITAL_BASED_OUTPATIENT_CLINIC_OR_DEPARTMENT_OTHER): Payer: Medicaid Other

## 2016-04-27 VITALS — BP 159/93 | HR 105 | Temp 98.0°F | Resp 16

## 2016-04-27 DIAGNOSIS — Z452 Encounter for adjustment and management of vascular access device: Secondary | ICD-10-CM

## 2016-04-27 DIAGNOSIS — R911 Solitary pulmonary nodule: Secondary | ICD-10-CM

## 2016-04-27 DIAGNOSIS — C184 Malignant neoplasm of transverse colon: Secondary | ICD-10-CM

## 2016-04-27 MED ORDER — HYDROCODONE-ACETAMINOPHEN 5-325 MG PO TABS: 1.0000 | ORAL_TABLET | Freq: Four times a day (QID) | ORAL | 0 refills | Status: DC | PRN

## 2016-04-27 MED ORDER — HEPARIN SOD (PORK) LOCK FLUSH 100 UNIT/ML IV SOLN
500.0000 [IU] | Freq: Once | INTRAVENOUS | Status: AC
  Administered 2016-04-27: 500 [IU] via INTRAVENOUS
  Filled 2016-04-27: qty 5

## 2016-04-27 MED ORDER — SODIUM CHLORIDE 0.9% FLUSH
20.0000 mL | INTRAVENOUS | Status: DC | PRN
  Administered 2016-04-27: 20 mL via INTRAVENOUS
  Filled 2016-04-27: qty 20

## 2016-04-27 NOTE — Progress Notes (Signed)
Erik [name] presented for Portacath access and flush. Proper placement of portacath confirmed by CXR. Portacath located right chest wall accessed with  H 20 needle. Good blood return present. Portacath flushed with 10ml NS and 500U/53ml Heparin and needle removed intact. Procedure without incident. Patient tolerated procedure well.

## 2016-04-27 NOTE — Patient Instructions (Signed)
[facility] at [facility] Discharge Instructions  RECOMMENDATIONS MADE BY THE CONSULTANT AND ANY TEST RESULTS WILL BE SENT TO YOUR REFERRING PHYSICIAN.  Port flush today.    Thank you for choosing [facility] at [facility] to provide your oncology and hematology care.  To afford each patient quality time with our provider, please arrive at least 15 minutes before your scheduled appointment time.    If you have a lab appointment with the Cancer Center please come in thru the  Main Entrance and check in at the main information desk  You need to re-schedule your appointment should you arrive 10 or more minutes late.  We strive to give you quality time with our providers, and arriving late affects you and other patients whose appointments are after yours.  Also, if you no show three or more times for appointments you may be dismissed from the clinic at the providers discretion.     Again, thank you for choosing [facility].  Our hope is that these requests will decrease the amount of time that you wait before being seen by our physicians.       _____________________________________________________________  Should you have questions after your visit to [facility], please contact our office at [phone] between the hours of 8:30 a.m. and 4:30 p.m.  Voicemails left after 4:30 p.m. will not be returned until the following business day.  For prescription refill requests, have your pharmacy contact our office.       Resources For Cancer Patients and their Caregivers ? American Cancer Society: Can assist with transportation, wigs, general needs, runs Look Good Feel Better.        1-888-227-6333 ? Cancer Care: Provides financial assistance, online support groups, medication/co-pay assistance.  1-800-813-HOPE (4673) ? [facility] Assists [place] cancer patients and their families through  emotional , educational and financial support.  336-427-4357 ? [place] DSS Where to apply for food stamps, Medicaid and utility assistance. 336-342-1394 ? RCATS: Transportation to medical appointments. 336-347-2287 ? Social Security Administration: May apply for disability if have a Stage IV cancer. 336-342-7796 1-800-772-1213 ? [place] Aging, Disability and Transit Services: Assists with nutrition, care and transit needs. 336-349-2343  Cancer Center Support Programs: @10RELATIVEDAYS@ > Cancer Support Group  2nd Tuesday of the month 1pm-2pm, Journey Room  > Creative Journey  3rd Tuesday of the month 1130am-1pm, Journey Room  > Look Good Feel Better  1st Wednesday of the month 10am-12 noon, Journey Room (Call [employer] to register [phone])    

## 2016-05-15 ENCOUNTER — Encounter (HOSPITAL_COMMUNITY): Payer: Medicaid Other | Attending: Oncology

## 2016-05-15 ENCOUNTER — Ambulatory Visit (HOSPITAL_COMMUNITY): Payer: Medicaid Other

## 2016-05-15 VITALS — BP 140/78 | HR 94 | Temp 98.9°F | Resp 18

## 2016-05-15 DIAGNOSIS — E538 Deficiency of other specified B group vitamins: Secondary | ICD-10-CM | POA: Diagnosis present

## 2016-05-15 MED ORDER — CYANOCOBALAMIN 1000 MCG/ML IJ SOLN
INTRAMUSCULAR | Status: AC
  Filled 2016-05-15: qty 1

## 2016-05-15 MED ORDER — CYANOCOBALAMIN 1000 MCG/ML IJ SOLN
1000.0000 ug | Freq: Once | INTRAMUSCULAR | Status: AC
  Administered 2016-05-15: 1000 ug via INTRAMUSCULAR

## 2016-05-15 NOTE — Progress Notes (Signed)
Erik [name] presents today for injection per MD orders. B12 1000 mcg administered IM in left Upper Arm. Administration without incident. Patient tolerated well.

## 2016-05-15 NOTE — Patient Instructions (Signed)
[facility] at [facility] Discharge Instructions  RECOMMENDATIONS MADE BY THE CONSULTANT AND ANY TEST RESULTS WILL BE SENT TO YOUR REFERRING PHYSICIAN.  Vitamin B12 1000 mcg injection given as ordered.  Thank you for choosing  [facility] at [facility] to provide your oncology and hematology care.  To afford each patient quality time with our provider, please arrive at least 15 minutes before your scheduled appointment time.    If you have a lab appointment with the Cancer Center please come in thru the  Main Entrance and check in at the main information desk  You need to re-schedule your appointment should you arrive 10 or more minutes late.  We strive to give you quality time with our providers, and arriving late affects you and other patients whose appointments are after yours.  Also, if you no show three or more times for appointments you may be dismissed from the clinic at the providers discretion.     Again, thank you for choosing [facility].  Our hope is that these requests will decrease the amount of time that you wait before being seen by our physicians.       _____________________________________________________________  Should you have questions after your visit to [facility], please contact our office at [phone] between the hours of 8:30 a.m. and 4:30 p.m.  Voicemails left after 4:30 p.m. will not be returned until the following business day.  For prescription refill requests, have your pharmacy contact our office.       Resources For Cancer Patients and their Caregivers ? American Cancer Society: Can assist with transportation, wigs, general needs, runs Look Good Feel Better.        1-888-227-6333 ? Cancer Care: Provides financial assistance, online support groups, medication/co-pay assistance.  1-800-813-HOPE (4673) ? [facility] Assists [place] cancer patients and  their families through emotional , educational and financial support.  336-427-4357 ? [place] DSS Where to apply for food stamps, Medicaid and utility assistance. 336-342-1394 ? RCATS: Transportation to medical appointments. 336-347-2287 ? Social Security Administration: May apply for disability if have a Stage IV cancer. 336-342-7796 1-800-772-1213 ? [place] Aging, Disability and Transit Services: Assists with nutrition, care and transit needs. 336-349-2343  Cancer Center Support Programs: @10RELATIVEDAYS@ > Cancer Support Group  2nd Tuesday of the month 1pm-2pm, Journey Room  > Creative Journey  3rd Tuesday of the month 1130am-1pm, Journey Room  > Look Good Feel Better  1st Wednesday of the month 10am-12 noon, Journey Room (Call [employer] to register [phone])   

## 2016-05-17 ENCOUNTER — Other Ambulatory Visit: Payer: Self-pay | Admitting: Physician Assistant

## 2016-05-17 ENCOUNTER — Other Ambulatory Visit (HOSPITAL_COMMUNITY): Payer: Self-pay | Admitting: Oncology

## 2016-05-17 DIAGNOSIS — G620 Drug-induced polyneuropathy: Secondary | ICD-10-CM

## 2016-05-17 DIAGNOSIS — T451X5A Adverse effect of antineoplastic and immunosuppressive drugs, initial encounter: Principal | ICD-10-CM

## 2016-05-17 LAB — LIPID PANEL
Cholesterol: 151 mg/dL
HDL: 54 mg/dL
LDL Cholesterol: 72 mg/dL
Total CHOL/HDL Ratio: 2.8 ratio
Triglycerides: 124 mg/dL
VLDL: 25 mg/dL

## 2016-06-08 ENCOUNTER — Encounter: Payer: Self-pay | Admitting: Gastroenterology

## 2016-06-13 ENCOUNTER — Other Ambulatory Visit (HOSPITAL_COMMUNITY): Payer: Self-pay | Admitting: *Deleted

## 2016-06-13 DIAGNOSIS — C184 Malignant neoplasm of transverse colon: Secondary | ICD-10-CM

## 2016-06-14 ENCOUNTER — Encounter (HOSPITAL_BASED_OUTPATIENT_CLINIC_OR_DEPARTMENT_OTHER): Payer: Medicaid Other | Admitting: Oncology

## 2016-06-14 ENCOUNTER — Other Ambulatory Visit: Payer: Self-pay | Admitting: Physician Assistant

## 2016-06-14 ENCOUNTER — Ambulatory Visit (HOSPITAL_COMMUNITY): Payer: Medicaid Other | Admitting: Oncology

## 2016-06-14 ENCOUNTER — Encounter (HOSPITAL_COMMUNITY): Payer: Medicaid Other

## 2016-06-14 ENCOUNTER — Other Ambulatory Visit (HOSPITAL_COMMUNITY): Payer: Self-pay | Admitting: Pharmacist

## 2016-06-14 ENCOUNTER — Encounter (HOSPITAL_COMMUNITY): Payer: Self-pay | Admitting: Oncology

## 2016-06-14 ENCOUNTER — Encounter (HOSPITAL_COMMUNITY): Payer: Medicaid Other | Attending: Oncology

## 2016-06-14 ENCOUNTER — Other Ambulatory Visit (HOSPITAL_COMMUNITY): Payer: Medicaid Other

## 2016-06-14 VITALS — BP 136/63 | HR 111 | Temp 98.1°F | Resp 18 | Wt 235.5 lb

## 2016-06-14 DIAGNOSIS — F17210 Nicotine dependence, cigarettes, uncomplicated: Secondary | ICD-10-CM | POA: Diagnosis not present

## 2016-06-14 DIAGNOSIS — E538 Deficiency of other specified B group vitamins: Secondary | ICD-10-CM | POA: Diagnosis present

## 2016-06-14 DIAGNOSIS — Z803 Family history of malignant neoplasm of breast: Secondary | ICD-10-CM | POA: Diagnosis not present

## 2016-06-14 DIAGNOSIS — K219 Gastro-esophageal reflux disease without esophagitis: Secondary | ICD-10-CM | POA: Insufficient documentation

## 2016-06-14 DIAGNOSIS — Z8249 Family history of ischemic heart disease and other diseases of the circulatory system: Secondary | ICD-10-CM | POA: Diagnosis not present

## 2016-06-14 DIAGNOSIS — Z9889 Other specified postprocedural states: Secondary | ICD-10-CM | POA: Diagnosis not present

## 2016-06-14 DIAGNOSIS — T451X5A Adverse effect of antineoplastic and immunosuppressive drugs, initial encounter: Secondary | ICD-10-CM

## 2016-06-14 DIAGNOSIS — C184 Malignant neoplasm of transverse colon: Secondary | ICD-10-CM | POA: Insufficient documentation

## 2016-06-14 DIAGNOSIS — F329 Major depressive disorder, single episode, unspecified: Secondary | ICD-10-CM | POA: Insufficient documentation

## 2016-06-14 DIAGNOSIS — Z806 Family history of leukemia: Secondary | ICD-10-CM | POA: Diagnosis not present

## 2016-06-14 DIAGNOSIS — I10 Essential (primary) hypertension: Secondary | ICD-10-CM | POA: Diagnosis not present

## 2016-06-14 DIAGNOSIS — G620 Drug-induced polyneuropathy: Secondary | ICD-10-CM | POA: Diagnosis not present

## 2016-06-14 DIAGNOSIS — Z9221 Personal history of antineoplastic chemotherapy: Secondary | ICD-10-CM | POA: Insufficient documentation

## 2016-06-14 DIAGNOSIS — G629 Polyneuropathy, unspecified: Secondary | ICD-10-CM | POA: Insufficient documentation

## 2016-06-14 DIAGNOSIS — R911 Solitary pulmonary nodule: Secondary | ICD-10-CM | POA: Insufficient documentation

## 2016-06-14 DIAGNOSIS — Z95828 Presence of other vascular implants and grafts: Secondary | ICD-10-CM

## 2016-06-14 HISTORY — DX: Solitary pulmonary nodule: R91.1

## 2016-06-14 LAB — CBC WITH DIFFERENTIAL/PLATELET
Basophils Absolute: 0.1 10*3/uL (ref 0.0–0.1)
Basophils Relative: 1 %
EOS ABS: 0.2 10*3/uL (ref 0.0–0.7)
Eosinophils Relative: 2 %
HEMATOCRIT: 44.7 % (ref 39.0–52.0)
HEMOGLOBIN: 14.9 g/dL (ref 13.0–17.0)
Lymphocytes Relative: 21 %
Lymphs Abs: 2.1 10*3/uL (ref 0.7–4.0)
MCH: 30.8 pg (ref 26.0–34.0)
MCHC: 33.3 g/dL (ref 30.0–36.0)
MCV: 92.4 fL (ref 78.0–100.0)
Monocytes Absolute: 0.7 10*3/uL (ref 0.1–1.0)
Monocytes Relative: 8 %
Neutro Abs: 6.8 10*3/uL (ref 1.7–7.7)
Neutrophils Relative %: 68 %
Platelets: 209 10*3/uL (ref 150–400)
RBC: 4.84 MIL/uL (ref 4.22–5.81)
RDW: 13.2 % (ref 11.5–15.5)
WBC: 9.9 10*3/uL (ref 4.0–10.5)

## 2016-06-14 LAB — COMPREHENSIVE METABOLIC PANEL
ALK PHOS: 74 U/L (ref 38–126)
ALT: 22 U/L (ref 17–63)
ANION GAP: 11 (ref 5–15)
AST: 21 U/L (ref 15–41)
Albumin: 4.3 g/dL (ref 3.5–5.0)
BILIRUBIN TOTAL: 0.8 mg/dL (ref 0.3–1.2)
BUN: 13 mg/dL (ref 6–20)
CALCIUM: 9.4 mg/dL (ref 8.9–10.3)
CO2: 29 mmol/L (ref 22–32)
Chloride: 100 mmol/L — ABNORMAL LOW (ref 101–111)
Creatinine, Ser: 0.98 mg/dL (ref 0.61–1.24)
Glucose, Bld: 100 mg/dL — ABNORMAL HIGH (ref 65–99)
Potassium: 3.9 mmol/L (ref 3.5–5.1)
SODIUM: 140 mmol/L (ref 135–145)
TOTAL PROTEIN: 8 g/dL (ref 6.5–8.1)

## 2016-06-14 LAB — FERRITIN: FERRITIN: 86 ng/mL (ref 24–336)

## 2016-06-14 MED ORDER — FLUOXETINE HCL 20 MG PO CAPS: ORAL_CAPSULE | ORAL | 0 refills | Status: DC

## 2016-06-14 MED ORDER — GABAPENTIN 300 MG PO CAPS: ORAL_CAPSULE | ORAL | 1 refills | Status: DC

## 2016-06-14 MED ORDER — ATORVASTATIN CALCIUM 20 MG PO TABS: 20.0000 mg | ORAL_TABLET | Freq: Every day | ORAL | 0 refills | Status: DC

## 2016-06-14 MED ORDER — DEXLANSOPRAZOLE 60 MG PO CPDR: 1.0000 | DELAYED_RELEASE_CAPSULE | Freq: Every day | ORAL | 0 refills | Status: DC

## 2016-06-14 MED ORDER — LOPERAMIDE HCL 2 MG PO CAPS: ORAL_CAPSULE | ORAL | 3 refills | Status: DC

## 2016-06-14 MED ORDER — HEPARIN SOD (PORK) LOCK FLUSH 100 UNIT/ML IV SOLN
500.0000 [IU] | Freq: Once | INTRAVENOUS | Status: AC
  Administered 2016-06-14: 500 [IU] via INTRAVENOUS
  Filled 2016-06-14: qty 5

## 2016-06-14 MED ORDER — AMITRIPTYLINE HCL 25 MG PO TABS: 25.0000 mg | ORAL_TABLET | Freq: Every day | ORAL | 0 refills | Status: DC

## 2016-06-14 MED ORDER — CYANOCOBALAMIN 1000 MCG/ML IJ SOLN
1000.0000 ug | Freq: Once | INTRAMUSCULAR | Status: AC
  Administered 2016-06-14: 1000 ug via INTRAMUSCULAR
  Filled 2016-06-14: qty 1

## 2016-06-14 MED ORDER — SODIUM CHLORIDE 0.9% FLUSH
10.0000 mL | INTRAVENOUS | Status: DC | PRN
  Administered 2016-06-14: 10 mL via INTRAVENOUS
  Filled 2016-06-14: qty 10

## 2016-06-14 MED ORDER — HYDROCODONE-ACETAMINOPHEN 5-325 MG PO TABS: 1.0000 | ORAL_TABLET | Freq: Four times a day (QID) | ORAL | 0 refills | Status: DC | PRN

## 2016-06-14 NOTE — Assessment & Plan Note (Signed)
B12 deficiency, on IM replacement monthly.

## 2016-06-14 NOTE — Patient Instructions (Signed)
[place] at [facility] Discharge Instructions  RECOMMENDATIONS MADE BY THE CONSULTANT AND ANY TEST RESULTS WILL BE SENT TO YOUR REFERRING PHYSICIAN.  B12 injection today  B12 every 4 weeks  Port flush every 8 weeks  Port flush with lab in 12 weeks when seeing Tom for follow up  Ct abdomen/pelvis September 2018  Refill on Gabapentin escribed    Thank you for choosing [place] at [facility] to provide your oncology and hematology care.  To afford each patient quality time with our provider, please arrive at least 15 minutes before your scheduled appointment time.    If you have a lab appointment with the [place] please come in thru the  Main Entrance and check in at the main information desk  You need to re-schedule your appointment should you arrive 10 or more minutes late.  We strive to give you quality time with our providers, and arriving late affects you and other patients whose appointments are after yours.  Also, if you no show three or more times for appointments you may be dismissed from the clinic at the providers discretion.     Again, thank you for choosing [facility].  Our hope is that these requests will decrease the amount of time that you wait before being seen by our physicians.       _____________________________________________________________  Should you have questions after your visit to [facility], please contact our office at [phone] between the hours of 8:30 a.m. and 4:30 p.m.  Voicemails left after 4:30 p.m. will not be returned until the following business day.  For prescription refill requests, have your pharmacy contact our office.       Resources For Cancer Patients and their Caregivers ? American Cancer Society: Can assist with transportation, wigs, general needs, runs Look Good Feel Better.        (336)592-7227 ? Cancer Care: Provides financial assistance,  online support groups, medication/co-pay assistance.  1-800-813-HOPE 951 013 7737) ? [place] Assists [place] cancer patients and their families through emotional , educational and financial support.  (228) 093-2692 ? [place] DSS Where to apply for food stamps, Medicaid and utility assistance. 517-806-7624 ? RCATS: Transportation to medical appointments. 7265323060 ? Social Security Administration: May apply for disability if have a Stage IV cancer. 680-270-9180 2368284541 ? [employer], Disability and Transit Services: Assists with nutrition, care and transit needs. [place] Support Programs: @10RELATIVEDAYS @ > Cancer Support Group  2nd Tuesday of the month 1pm-2pm, Journey Room  > Creative Journey  3rd Tuesday of the month 1130am-1pm, Journey Room  > Look Good Feel Better  1st Wednesday of the month 10am-12 noon, Journey Room (Call [place] to register [phone])

## 2016-06-14 NOTE — Progress Notes (Signed)
Erik [name] presents today for injection per the provider's orders.  B12 administration without incident; see MAR for injection details.  Patient tolerated procedure well and without incident.  No questions or complaints noted at this time.  Erik [name] presented for Portacath access and flush.  Portacath located right chest wall accessed with  H 20 needle.  Good blood return present. Portacath flushed with 47ml NS and 500U/10ml Heparin and needle removed intact.  Procedure tolerated well and without incident.

## 2016-06-14 NOTE — Assessment & Plan Note (Addendum)
Stage IIIC [facility]) adenocarcinoma of transverse colon, diagnosed on colonoscopy by Dr. Oneida Alar on 01/12/2015 followed by definitive surgery by Dr. [name] with right hemicolectomy on 02/12/2015.  He then underwent FOLFOX x 12 cycles in the adjuvant setting (03/30/2015- 09/06/2015).  Oxaliplatin was discontinued for cycle #12 due to progressive peripheral neuropathy.  Oncology history is updated.  He is up to date on colonoscopy with last one being with Dr. Oneida Alar in December 2017 (after completion of chemotherapy).  Labs today: CBC diff, CMET, CEA, ferritin.  I personally reviewed and went over laboratory results with the patient.  The results are noted within this dictation.  Labs in 3 months: CBC diff, CMET, CEA.  I personally reviewed and went over radiographic studies with the patient.  The results are noted within this dictation.  I personally reviewed the images in PACS.  CT chest in March 2018 demonstrated stability of pulmonary lesion.  I would consider re-imaging in March 2019 to confirm stability.  Annual surveillance imaging is due in Sept 2018.  Order is placed for CT abd/pelvis in Sept 2018.  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  He will continue with gabapentin.  Oxaliplatin was discontinued for his last cycle at which time 5FU/Leucovorin was given only for cycle #12.  He is provided education regarding oxaliplatin-induced peripheral neuropathy.   Due to recent pharmacy tragedy, I will refill number of his medications and we will send these to [facility].  We have agreed to help pay the patient's prescription costs for the next 1-2 months.  Many medications are only refilled temporarily until his usual pharmacy has recovered in June 2018 (anticipated).  At that time, will defer most medication refills to his primary care physician.  Return in 12 weeks for follow-up.

## 2016-06-14 NOTE — Assessment & Plan Note (Signed)
Left upper lobe pulmonary nodule is stable.  I personally reviewed and went over radiographic studies with the patient.  The results are noted within this dictation.  I personally reviewed the images in PACS.  I would consider repeat imaging of chest in March 2019.
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
## 2016-06-14 NOTE — Patient Instructions (Signed)
[facility] at [facility] Discharge Instructions  RECOMMENDATIONS MADE BY THE CONSULTANT AND ANY TEST RESULTS WILL BE SENT TO YOUR REFERRING PHYSICIAN.    Thank you for choosing [facility] at [facility] to provide your oncology and hematology care.  To afford each patient quality time with our provider, please arrive at least 15 minutes before your scheduled appointment time.    If you have a lab appointment with the Cancer Center please come in thru the  Main Entrance and check in at the main information desk  You need to re-schedule your appointment should you arrive 10 or more minutes late.  We strive to give you quality time with our providers, and arriving late affects you and other patients whose appointments are after yours.  Also, if you no show three or more times for appointments you may be dismissed from the clinic at the providers discretion.     Again, thank you for choosing [facility].  Our hope is that these requests will decrease the amount of time that you wait before being seen by our physicians.       _____________________________________________________________  Should you have questions after your visit to [facility], please contact our office at [phone] between the hours of 8:30 a.m. and 4:30 p.m.  Voicemails left after 4:30 p.m. will not be returned until the following business day.  For prescription refill requests, have your pharmacy contact our office.       Resources For Cancer Patients and their Caregivers ? American Cancer Society: Can assist with transportation, wigs, general needs, runs Look Good Feel Better.        1-888-227-6333 ? Cancer Care: Provides financial assistance, online support groups, medication/co-pay assistance.  1-800-813-HOPE (4673) ? [facility] Assists [place] cancer patients and their families through emotional , educational  and financial support.  336-427-4357 ? [place] DSS Where to apply for food stamps, Medicaid and utility assistance. 336-342-1394 ? [facility]: Transportation to medical appointments. 336-347-2287 ? Social Security Administration: May apply for disability if have a Stage IV cancer. 336-342-7796 1-800-772-1213 ? [place] Aging, Disability and Transit Services: Assists with nutrition, care and transit needs. 336-349-2343  Cancer Center Support Programs: @10RELATIVEDAYS@ > Cancer Support Group  2nd Tuesday of the month 1pm-2pm, Journey Room  > Creative Journey  3rd Tuesday of the month 1130am-1pm, Journey Room  > Look Good Feel Better  1st Wednesday of the month 10am-12 noon, Journey Room (Call American Cancer Society to register 1-800-395-5775)   

## 2016-06-15 ENCOUNTER — Other Ambulatory Visit (HOSPITAL_COMMUNITY): Payer: Self-pay | Admitting: Oncology

## 2016-06-15 DIAGNOSIS — C184 Malignant neoplasm of transverse colon: Secondary | ICD-10-CM

## 2016-06-15 LAB — CEA: CEA: 2.5 ng/mL (ref 0.0–4.7)

## 2016-06-19 ENCOUNTER — Encounter: Payer: Self-pay | Admitting: Physician Assistant

## 2016-06-19 ENCOUNTER — Ambulatory Visit: Payer: Self-pay | Admitting: Physician Assistant

## 2016-06-19 VITALS — BP 140/68 | HR 99 | Temp 97.9°F | Ht 73.0 in | Wt 239.0 lb

## 2016-06-19 DIAGNOSIS — I951 Orthostatic hypotension: Secondary | ICD-10-CM

## 2016-06-19 DIAGNOSIS — F32A Depression, unspecified: Secondary | ICD-10-CM

## 2016-06-19 DIAGNOSIS — F329 Major depressive disorder, single episode, unspecified: Secondary | ICD-10-CM

## 2016-06-19 DIAGNOSIS — C184 Malignant neoplasm of transverse colon: Secondary | ICD-10-CM

## 2016-06-19 DIAGNOSIS — F17219 Nicotine dependence, cigarettes, with unspecified nicotine-induced disorders: Secondary | ICD-10-CM

## 2016-06-19 DIAGNOSIS — I2510 Atherosclerotic heart disease of native coronary artery without angina pectoris: Secondary | ICD-10-CM

## 2016-06-19 DIAGNOSIS — J449 Chronic obstructive pulmonary disease, unspecified: Secondary | ICD-10-CM

## 2016-06-19 DIAGNOSIS — E785 Hyperlipidemia, unspecified: Secondary | ICD-10-CM

## 2016-06-19 NOTE — Progress Notes (Signed)
BP 140/68 (BP Location: Left Arm, Patient Position: Sitting, Cuff Size: Normal)   Pulse 99   Temp 97.9 F (36.6 C)   Ht 6\' 1"  (1.854 m)   Wt 239 lb (108.4 kg)   SpO2 96%   BMI 31.53 kg/m    Subjective:    Patient ID: Erik [name], male    DOB: 1955/07/05, 61 y.o.   MRN: [id number]  HPI: Erik [name] is a 61 y.o. male presenting on 06/19/2016 for Follow-up   HPI   Pt is "doing fairly well".  He is still going to oncology.  He is Still smoking some.  He is Still drinking some.  Pt has f/u with neurology in November.  He says he just got letter to call for f/u with gastroenterology.   He has f/u with oncology in august.   Pt says he may call to return to neurology sooner b/c he had blackout while watching tv.  That was 2 wk ago.   He says he didn't completely black out, but was "near it".   Relevant past medical, surgical, family and social history reviewed and updated as indicated. Interim medical history since our last visit reviewed. Allergies and medications reviewed and updated.   Current Outpatient Prescriptions:  .  amitriptyline (ELAVIL) 25 MG tablet, Take 1 tablet (25 mg total) by mouth at bedtime., Disp: 30 tablet, Rfl: 0 .  atorvastatin (LIPITOR) 20 MG tablet, Take 1 tablet (20 mg total) by mouth at bedtime., Disp: 30 tablet, Rfl: 0 .  cyanocobalamin (,VITAMIN B-12,) 1000 MCG/ML injection, Inject 1,000 mcg into the muscle every 30 (thirty) days., Disp: , Rfl:  .  dexlansoprazole (DEXILANT) 60 MG capsule, Take 1 capsule (60 mg total) by mouth daily., Disp: 30 capsule, Rfl: 0 .  FLUoxetine (PROZAC) 20 MG capsule, TAKE 1 CAPSULE BY MOUTH 2 TIMES A DAY, Disp: 60 capsule, Rfl: 0 .  gabapentin (NEURONTIN) 300 MG capsule, TAKE 4 CAPSULES BY MOUTH EVERY DAY AT BEDTIME (Patient taking differently: TAKE 1 CAP QAM AND 3 CAPSULES BY MOUTH EVERY DAY AT BEDTIME), Disp: 120 capsule, Rfl: 1 .  HYDROcodone-acetaminophen (NORCO) 5-325 MG tablet, Take 1 tablet by mouth every 6 (six)  hours as needed for moderate pain., Disp: 60 tablet, Rfl: 0 .  loperamide (IMODIUM) 2 MG capsule, TAKE 1 TO 2 CAPSULES BY MOUTH EVERY 6 HOURS AS NEEDED FOR DIARRHEA OR LOOSE STOOLS. (Patient taking differently: Take 4 mg by mouth daily. TAKE 1 TO 2 CAPSULES BY MOUTH EVERY 6 HOURS AS NEEDED FOR DIARRHEA OR LOOSE STOOLS.), Disp: 60 capsule, Rfl: 3 .  PROAIR HFA 108 (90 Base) MCG/ACT inhaler, INHALE 2 PUFFS BY MOUTH EVERY 6 HOURS AS NEEDED FOR SHORTNESS OF BREATH/WHEEZING, Disp: 8.5 each, Rfl: 5 No current facility-administered medications for this visit.   Facility-Administered Medications Ordered in Other Visits:  .  sodium chloride flush (NS) 0.9 % injection 10 mL, 10 mL, Intracatheter, PRN, Penland, Kelby Fam, MD, 10 mL at 08/23/15 1030   Review of Systems  Constitutional: Positive for fatigue. Negative for appetite change, chills, diaphoresis, fever and unexpected weight change.  HENT: Negative for congestion, drooling, ear pain, facial swelling, hearing loss, mouth sores, sneezing, sore throat, trouble swallowing and voice change.   Eyes: Negative for pain, discharge, redness, itching and visual disturbance.  Respiratory: Negative for cough, choking, shortness of breath and wheezing.   Cardiovascular: Negative for chest pain, palpitations and leg swelling.  Gastrointestinal: Positive for abdominal pain and diarrhea. Negative for  blood in stool, constipation and vomiting.  Endocrine: Negative for cold intolerance, heat intolerance and polydipsia.  Genitourinary: Negative for decreased urine volume, dysuria and hematuria.  Musculoskeletal: Positive for gait problem. Negative for arthralgias and back pain.  Skin: Negative for rash.  Allergic/Immunologic: Negative for environmental allergies.  Neurological: Positive for light-headedness. Negative for seizures, syncope and headaches.  Hematological: Negative for adenopathy.  Psychiatric/Behavioral: Positive for dysphoric mood. Negative for  agitation and suicidal ideas. The patient is nervous/anxious.     Per HPI unless specifically indicated above     Objective:    BP 140/68 (BP Location: Left Arm, Patient Position: Sitting, Cuff Size: Normal)   Pulse 99   Temp 97.9 F (36.6 C)   Ht 6\' 1"  (1.854 m)   Wt 239 lb (108.4 kg)   SpO2 96%   BMI 31.53 kg/m   Wt Readings from Last 3 Encounters:  06/19/16 239 lb (108.4 kg)  06/14/16 235 lb 8 oz (106.8 kg)  03/22/16 231 lb (104.8 kg)    Physical Exam  Constitutional: He is oriented to person, place, and time. He appears well-developed and well-nourished.  HENT:  Head: Normocephalic and atraumatic.  Neck: Neck supple.  Cardiovascular: Normal rate and regular rhythm.   Pulmonary/Chest: Effort normal and breath sounds normal. He has no wheezes.  Abdominal: Soft. Bowel sounds are normal. There is no hepatosplenomegaly. There is no tenderness.  Musculoskeletal: He exhibits no edema.  Lymphadenopathy:    He has no cervical adenopathy.  Neurological: He is alert and oriented to person, place, and time. Gait (walks with a cane) abnormal.  Skin: Skin is warm and dry.  Psychiatric: He has a normal mood and affect. His behavior is normal.  Vitals reviewed.       Assessment & Plan:   Encounter Diagnoses  Name Primary?  . Hyperlipidemia, unspecified hyperlipidemia type Yes  . Cigarette nicotine dependence with nicotine-induced disorder   . Chronic obstructive pulmonary disease, unspecified COPD type ([place])   . Adenocarcinoma of transverse colon ([place])   . Atherosclerosis of native coronary artery of native heart without angina pectoris   . Orthostatic syncope   . Depression, unspecified depression type     -reviewed labs.  Pt had lipids checked in April.  -bp a bit high today.  Was low last time he was here in the office.  Will not adjust medication, especially in light of orthostatic syncope -no changes in medication today -pt encouraged to discontinue drinking etoh and  smoking -follow up in 4months.  RTO sooner prn

## 2016-06-22 ENCOUNTER — Encounter (HOSPITAL_COMMUNITY): Payer: Medicaid Other

## 2016-07-04 NOTE — Congregational Nurse Program (Signed)
Congregational Nurse Program Note  Date of Encounter: 06/28/2016  Past Medical History: Past Medical History:  Diagnosis Date  . Abnormal stress echocardiogram   . Adenocarcinoma of transverse colon ([place]) 02/10/2015  . Alcohol abuse    Heavy Use up until 2010  . Anxiety   . Depression   . GERD (gastroesophageal reflux disease)   . Head trauma 2001   closed head injury; coma for 4 weeks  . Hypercholesterolemia   . Hypertension   . PTSD (post-traumatic stress disorder)   . Pulmonary nodule 06/14/2016    Encounter Details:     CNP Questionnaire - 06/28/16 1347      Patient Demographics   Is this a new or existing patient? Existing   Patient is considered a/an Not Applicable   Race Caucasian/White     Patient Assistance   Location of Patient [place]   Patient's financial/insurance status Low Income;Medicaid   Uninsured Patient ([facility]) No   Patient referred to apply for the following financial assistance Not Applicable   Food insecurities addressed Not Applicable   Transportation assistance No   Assistance securing medications No   Educational Naval architect health     Encounter Details   Primary purpose of visit Other   Does patient have a medical provider? Yes   Patient referred to Not Applicable   Was a mental health screening completed? (GAINS tool) No   Does patient have dental issues? No   Does patient have vision issues? No   Does your patient have an abnormal blood pressure today? No   Since previous encounter, have you referred patient for abnormal blood pressure that resulted in a new diagnosis or medication change? No   Does your patient have an abnormal blood glucose today? No   Since previous encounter, have you referred patient for abnormal blood glucose that resulted in a new diagnosis or medication change? No   Was there a life-saving intervention made? No     Client presented with a need for  financial assistance for his energy bill. Social worker assisted client in locating resource for assistance ([place]) and assisted client in completing application. Social worker will meet with client again to complete application. Ubaldo Glassing, MSW [phone]

## 2016-07-05 NOTE — Congregational Nurse Program (Signed)
Congregational Nurse Program Note  Date of Encounter: 07/04/2016  Past Medical History: Past Medical History:  Diagnosis Date  . Abnormal stress echocardiogram   . Adenocarcinoma of transverse colon ([place]) 02/10/2015  . Alcohol abuse    Heavy Use up until 2010  . Anxiety   . Depression   . GERD (gastroesophageal reflux disease)   . Head trauma 2001   closed head injury; coma for 4 weeks  . Hypercholesterolemia   . Hypertension   . PTSD (post-traumatic stress disorder)   . Pulmonary nodule 06/14/2016    Encounter Details:     CNP Questionnaire - 07/04/16 1024      Patient Demographics   Is this a new or existing patient? 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 Not Applicable   Does patient have a medical provider? Yes   Patient referred to Not Applicable   Was a mental health screening completed? (GAINS tool) No   Does patient have dental issues? No   Does patient have vision issues? No   Does your patient have an abnormal blood pressure today? No   Since previous encounter, have you referred patient for abnormal blood pressure that resulted in a new diagnosis or medication change? No   Does your patient have an abnormal blood glucose today? No   Since previous encounter, have you referred patient for abnormal blood glucose that resulted in a new diagnosis or medication change? No   Was  there a life-saving intervention made? No

## 2016-07-05 NOTE — Congregational Nurse Program (Signed)
Congregational Nurse Program Note  Date of Encounter: 07/04/2016  Past Medical History: Past Medical History:  Diagnosis Date  . Abnormal stress echocardiogram   . Adenocarcinoma of transverse colon ([place]) 02/10/2015  . Alcohol abuse    Heavy Use up until 2010  . Anxiety   . Depression   . GERD (gastroesophageal reflux disease)   . Head trauma 2001   closed head injury; coma for 4 weeks  . Hypercholesterolemia   . Hypertension   . PTSD (post-traumatic stress disorder)   . Pulmonary nodule 06/14/2016    Encounter Details:     CNP Questionnaire - 07/04/16 1024      Patient Demographics   Is this a new or existing patient? 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 Not Applicable   Does patient have a medical provider? Yes   Patient referred to Not Applicable   Was a mental health screening completed? (GAINS tool) No   Does patient have dental issues? No   Does patient have vision issues? No   Does your patient have an abnormal blood pressure today? No   Since previous encounter, have you referred patient for abnormal blood pressure that resulted in a new diagnosis or medication change? No   Does your patient have an abnormal blood glucose today? No   Since previous encounter, have you referred patient for abnormal blood glucose that resulted in a new diagnosis or medication change? No   Was  there a life-saving intervention made? No     Education officer, museum met with client at the [employer] [place] to complete application for financial assistance for his energy bill through the [employer]. Social worker will follow up with client to let him know the status of his application. Ubaldo Glassing, MSW, [phone]

## 2016-07-12 ENCOUNTER — Encounter (HOSPITAL_COMMUNITY): Payer: Medicaid Other | Attending: Hematology & Oncology

## 2016-07-12 ENCOUNTER — Encounter (HOSPITAL_COMMUNITY): Payer: Self-pay

## 2016-07-12 VITALS — BP 156/83 | HR 114 | Temp 97.4°F | Resp 18

## 2016-07-12 DIAGNOSIS — E538 Deficiency of other specified B group vitamins: Secondary | ICD-10-CM

## 2016-07-12 DIAGNOSIS — C184 Malignant neoplasm of transverse colon: Secondary | ICD-10-CM | POA: Insufficient documentation

## 2016-07-12 MED ORDER — CYANOCOBALAMIN 1000 MCG/ML IJ SOLN
1000.0000 ug | Freq: Once | INTRAMUSCULAR | Status: AC
  Administered 2016-07-12: 1000 ug via INTRAMUSCULAR

## 2016-07-12 MED ORDER — CYANOCOBALAMIN 1000 MCG/ML IJ SOLN
INTRAMUSCULAR | Status: AC
  Filled 2016-07-12: qty 1

## 2016-07-12 NOTE — Patient Instructions (Signed)
[facility] Discharge Instructions  RECOMMENDATIONS MADE BY THE CONSULTANT AND ANY TEST RESULTS WILL BE SENT TO YOUR REFERRING PHYSICIAN.  B12 injection today. Return as scheduled.   Thank you for choosing [facility] at [facility] to provide your oncology and hematology care.  To afford each patient quality time with our provider, please arrive at least 15 minutes before your scheduled appointment time.    If you have a lab appointment with the Cancer Center please come in thru the  Main Entrance and check in at the main information desk  You need to re-schedule your appointment should you arrive 10 or more minutes late.  We strive to give you quality time with our providers, and arriving late affects you and other patients whose appointments are after yours.  Also, if you no show three or more times for appointments you may be dismissed from the clinic at the providers discretion.     Again, thank you for choosing [facility].  Our hope is that these requests will decrease the amount of time that you wait before being seen by our physicians.       _____________________________________________________________  Should you have questions after your visit to [facility], please contact our office at [phone] between the hours of 8:30 a.m. and 4:30 p.m.  Voicemails left after 4:30 p.m. will not be returned until the following business day.  For prescription refill requests, have your pharmacy contact our office.       Resources For Cancer Patients and their Caregivers ? American Cancer Society: Can assist with transportation, wigs, general needs, runs Look Good Feel Better.        1-888-227-6333 ? Cancer Care: Provides financial assistance, online support groups, medication/co-pay assistance.  1-800-813-HOPE (4673) ? [facility] Assists [place] cancer patients and their  families through emotional , educational and financial support.  336-427-4357 ? [place] DSS Where to apply for food stamps, Medicaid and utility assistance. 336-342-1394 ? [facility]: Transportation to medical appointments. 336-347-2287 ? Social Security Administration: May apply for disability if have a Stage IV cancer. 336-342-7796 1-800-772-1213 ? [place] Aging, Disability and Transit Services: Assists with nutrition, care and transit needs. 336-349-2343  Cancer Center Support Programs: @10RELATIVEDAYS@ > Cancer Support Group  2nd Tuesday of the month 1pm-2pm, Journey Room  > Creative Journey  3rd Tuesday of the month 1130am-1pm, Journey Room  > Look Good Feel Better  1st Wednesday of the month 10am-12 noon, Journey Room (Call American Cancer Society to register 1-800-395-5775)   

## 2016-07-12 NOTE — Congregational Nurse Program (Signed)
Congregational Nurse Program Note  Date of Encounter: 07/11/2016  Past Medical History: Past Medical History:  Diagnosis Date  . Abnormal stress echocardiogram   . Adenocarcinoma of transverse colon ([place]) 02/10/2015  . Alcohol abuse    Heavy Use up until 2010  . Anxiety   . Depression   . GERD (gastroesophageal reflux disease)   . Head trauma 2001   closed head injury; coma for 4 weeks  . Hypercholesterolemia   . Hypertension   . PTSD (post-traumatic stress disorder)   . Pulmonary nodule 06/14/2016    Encounter Details:     CNP Questionnaire - 07/12/16 1110      Patient Demographics   Is this a new or existing patient? Existing   Patient is considered a/an Not Applicable   Race Caucasian/White     Patient Assistance   Location of Patient Assistance Not Applicable   Patient's financial/insurance status Low Income;Medicaid   Uninsured Patient ([facility] Oncologist) No   Patient referred to apply for the following financial assistance Not Applicable   Food insecurities addressed Not Applicable   Transportation assistance No   Assistance securing medications No   Type of Assistance Other   Educational health offerings Interpersonal relationships     Encounter Details   Primary purpose of visit Other   Was an Emergency Department visit averted? Not Applicable   Does patient have a medical provider? Yes   Patient referred to Not Applicable   Was a mental health screening completed? (GAINS tool) No   Does patient have dental issues? No   Does patient have vision issues? No   Does your patient have an abnormal blood pressure today? No   Since previous encounter, have you referred patient for abnormal blood pressure that resulted in a new diagnosis or medication change? No   Does your patient have an abnormal blood glucose today? No   Since previous encounter, have you referred patient for abnormal blood glucose that resulted in a new diagnosis or medication change? No    Was there a life-saving intervention made? No     Education officer, museum met with client at the [employer] [place] in [place]. Client expressed feeling "down" both physically and mentally. Client denied suicidal thoughts and client reported that he has never been suicidal. Client stated he was feeling low due to the barriers in his life. Client is currently connected to a therapist and psychiatrist at [facility].  Social worker contacted [place] to ensure client would be provided with financial assistance for his energy bill. Social worker Patent attorney, but a voicemail was returned later in the day from [place] at the [facility] and she informed social worker that they would be paying $100.00 on the client's energy bill. The client was also informed.  During our session, we agreed to meet again next week to work on budgeting and making a plan for the client to pay for his upcoming copays on his medicaitons. Ubaldo Glassing, MSW [phone]

## 2016-07-12 NOTE — Progress Notes (Signed)
Erik [name] presents today for injection per the provider's orders.  B12 administration without incident; see MAR for injection details.  Patient tolerated procedure well and without incident.  No questions or complaints noted at this time.  Discharged ambulatory.  

## 2016-07-25 ENCOUNTER — Other Ambulatory Visit: Payer: Self-pay | Admitting: Physician Assistant

## 2016-07-25 MED ORDER — DEXLANSOPRAZOLE 60 MG PO CPDR: 1.0000 | DELAYED_RELEASE_CAPSULE | Freq: Every day | ORAL | 0 refills | Status: DC

## 2016-07-25 MED ORDER — ATORVASTATIN CALCIUM 20 MG PO TABS: 20.0000 mg | ORAL_TABLET | Freq: Every day | ORAL | 0 refills | Status: DC

## 2016-07-26 ENCOUNTER — Telehealth: Payer: Self-pay | Admitting: Physician Assistant

## 2016-07-26 NOTE — Telephone Encounter (Signed)
Social worker called client to inform him that the congregational nurse will be sending his medications to [facility] will be covering his co-pays. The medications will be delivered to his house.   Client was encouraged to contact [place] and his doctor at [facility] in order to see if they could possibly assist him in getting his Wellbutrin refilled. The [facility] was unable to assist with this medication because the prescription has not been called into [place].  Ubaldo Glassing, MSW, [phone]

## 2016-07-26 NOTE — Congregational Nurse Program (Signed)
Congregational Nurse Program Note  Date of Encounter: 07/26/2016  Past Medical History: Past Medical History:  Diagnosis Date  . Abnormal stress echocardiogram   . Adenocarcinoma of transverse colon ([place]) 02/10/2015  . Alcohol abuse    Heavy Use up until 2010  . Anxiety   . Depression   . GERD (gastroesophageal reflux disease)   . Head trauma 2001   closed head injury; coma for 4 weeks  . Hypercholesterolemia   . Hypertension   . PTSD (post-traumatic stress disorder)   . Pulmonary nodule 06/14/2016    Encounter Details:     CNP Questionnaire - 07/26/16 1039      Patient Demographics   Is this a new or existing patient? Existing   Patient is considered a/an Not Applicable   Race Caucasian/White     Patient Assistance   Location of Patient Assistance [facility] [place]   Patient's financial/insurance status Low Income;Medicaid   Uninsured Patient ([facility] Oncologist) No   Patient referred to apply for the following financial assistance Not Applicable   Food insecurities addressed Not Applicable   Transportation assistance Yes   Type of Assistance Bus Pass Given   Assistance securing medications Yes   Type of [place]   Educational health offerings Interpersonal relationships     Encounter Details   Primary purpose of visit Other   Was an Emergency Department visit averted? Not Applicable   Does patient have a medical provider? Yes   Patient referred to Not Applicable   Was a mental health screening completed? (GAINS tool) No   Does patient have dental issues? No   Does patient have vision issues? No   Does your patient have an abnormal blood pressure today? No   Since previous encounter, have you referred patient for abnormal blood pressure that resulted in a new diagnosis or medication change? No   Does your patient have an abnormal blood glucose today? No   Since previous encounter, have you referred patient for abnormal blood  glucose that resulted in a new diagnosis or medication change? No   Was there a life-saving intervention made? No     Follow up with existing client. He contacted RN needing SKAT bus pass for [facility] and [employer]. 10 ride SKAT pass obtained for client to use for [facility] appointments, [facility] appointments as well as [employer]. Client also reports he is out of his dexilant and atorvastatin and Wellbutrin and that he called [facility] and that he cannot get his medications until the first of July. Client has spoken to [facility] and provider will fax prescriptions to [facility] for dexilant and atorvastatin , but client reports he cannot pay the co pays for his medicaid. [facility] is helping with financial assistance for his medicaid co-pays through them for one year per client. Client has no income and is still awaiting appeal for disability. RN instructed client to contact [facility] in regards to Wellbutrin prescription and then let RN know. [facility] can do a one time assistance with co -pays for meds at [facility]. [facility] Well[facility]rin due to client needs to see psychiatrist. Client states he is not worried about that medicine that he is still taking his Prozac. RN faxed form to [place] to assist with medicaid co-pays of 6$ in order for client to receive his dexilant and atorvastatin. Client aware. Will  follow as needed.

## 2016-07-26 NOTE — Congregational Nurse Program (Signed)
Congregational Nurse Program Note  Date of Encounter: 07/25/2016  Past Medical History: Past Medical History:  Diagnosis Date  . Abnormal stress echocardiogram   . Adenocarcinoma of transverse colon ([place]) 02/10/2015  . Alcohol abuse    Heavy Use up until 2010  . Anxiety   . Depression   . GERD (gastroesophageal reflux disease)   . Head trauma 2001   closed head injury; coma for 4 weeks  . Hypercholesterolemia   . Hypertension   . PTSD (post-traumatic stress disorder)   . Pulmonary nodule 06/14/2016    Encounter Details:     CNP Questionnaire - 07/26/16 1039      Patient Demographics   Is this a new or existing patient? Existing   Patient is considered a/an Not Applicable   Race Caucasian/White     Patient Assistance   Location of Patient Assistance [facility], [place]   Patient's financial/insurance status Low Income;Medicaid   Uninsured Patient ([facility]) No   Patient referred to apply for the following financial assistance Not Applicable   Food insecurities addressed Not Applicable   Transportation assistance Yes   Type of Assistance [place] Given   Assistance securing medications Yes   Type of [place]   Educational health offerings Interpersonal relationships     Encounter Details   Primary purpose of visit Other   Was an Emergency Department visit averted? Not Applicable   Does patient have a medical provider? Yes   Patient referred to Not Applicable   Was a mental health screening completed? (GAINS tool) No   Does patient have dental issues? No   Does patient have vision issues? No   Does your patient have an abnormal blood pressure today? No   Since previous encounter, have you referred patient for abnormal blood pressure that resulted in a new diagnosis or medication change? No   Does your patient have an abnormal blood glucose today? No   Since previous encounter, have you referred patient for abnormal blood  glucose that resulted in a new diagnosis or medication change? No   Was there a life-saving intervention made? No    Social worker met with client to assist him in connecting with a new primary care doctor. Social worker and client called [facility] and we were unable to get through. Client will continue to call once he arrives home.  Client reported to social worker that he is currently out of three of his medications: Welbutrin, Lipitor, and Dexilant. He contacted the [facility] and he said they were unable to deliver his medications until July 3rd. Social worker referred client to congregational nurse for possible assistance with his copays for this medication.   Social worker will meet with client again next month for a check-in. Ubaldo Glassing, MSW, [phone]

## 2016-07-26 NOTE — Congregational Nurse Program (Signed)
Congregational Nurse Program Note  Date of Encounter: 07/18/2016  Past Medical History: Past Medical History:  Diagnosis Date  . Abnormal stress echocardiogram   . Adenocarcinoma of transverse colon ([place]) 02/10/2015  . Alcohol abuse    Heavy Use up until 2010  . Anxiety   . Depression   . GERD (gastroesophageal reflux disease)   . Head trauma 2001   closed head injury; coma for 4 weeks  . Hypercholesterolemia   . Hypertension   . PTSD (post-traumatic stress disorder)   . Pulmonary nodule 06/14/2016    Encounter Details:     CNP Questionnaire - 07/18/16 1034      Patient Demographics   Is this a new or existing patient? Existing   Patient is considered a/an Not Applicable   Race Caucasian/White     Patient Assistance   Location of Patient Assistance Not Applicable   Patient's financial/insurance status Low Income;Medicaid   Uninsured Patient ([facility]) No   Patient referred to apply for the following financial assistance Not Applicable   Food insecurities addressed Not Applicable   Transportation assistance No   Assistance securing medications No   Type of Assistance Other   Educational health offerings Interpersonal relationships;Navigating the healthcare system     Encounter Details   Primary purpose of visit Other   Was an Emergency Department visit averted? Not Applicable   Does patient have a medical provider? Yes   Patient referred to Not Applicable   Was a mental health screening completed? (GAINS tool) No   Does patient have dental issues? No   Does patient have vision issues? No   Does your patient have an abnormal blood pressure today? No   Since previous encounter, have you referred patient for abnormal blood pressure that resulted in a new diagnosis or medication change? No   Does your patient have an abnormal blood glucose today? No   Since previous encounter, have you referred patient for abnormal blood glucose that resulted in a new  diagnosis or medication change? No   Was there a life-saving intervention made? No    Social worker met with client to help him reconnect to a pharmacy that has been providing him financial assistance for his copays on his medications. Social worker contacted [employer] and they will be continuing to pay for the client's copays.  Social worker will meet with client next week to help him connect to a primary care doctor, since he is no longer eligible for services at the [facility], due to his receiving Medicaid.   Ubaldo Glassing, MSW, [phone]

## 2016-08-10 ENCOUNTER — Encounter (HOSPITAL_COMMUNITY): Payer: Self-pay

## 2016-08-10 ENCOUNTER — Encounter (HOSPITAL_COMMUNITY): Payer: Medicaid Other | Attending: Hematology & Oncology

## 2016-08-10 VITALS — BP 145/70 | HR 91 | Temp 98.2°F | Resp 18

## 2016-08-10 DIAGNOSIS — E538 Deficiency of other specified B group vitamins: Secondary | ICD-10-CM | POA: Diagnosis present

## 2016-08-10 DIAGNOSIS — C184 Malignant neoplasm of transverse colon: Secondary | ICD-10-CM | POA: Insufficient documentation

## 2016-08-10 MED ORDER — CYANOCOBALAMIN 1000 MCG/ML IJ SOLN
1000.0000 ug | Freq: Once | INTRAMUSCULAR | Status: AC
  Administered 2016-08-10: 1000 ug via INTRAMUSCULAR
  Filled 2016-08-10: qty 1

## 2016-08-10 MED ORDER — SODIUM CHLORIDE 0.9% FLUSH
10.0000 mL | INTRAVENOUS | Status: DC | PRN
  Administered 2016-08-10: 10 mL via INTRAVENOUS
  Filled 2016-08-10: qty 10

## 2016-08-10 MED ORDER — HEPARIN SOD (PORK) LOCK FLUSH 100 UNIT/ML IV SOLN
500.0000 [IU] | Freq: Once | INTRAVENOUS | Status: AC
  Administered 2016-08-10: 500 [IU] via INTRAVENOUS
  Filled 2016-08-10: qty 5

## 2016-08-10 NOTE — Progress Notes (Signed)
VARUN JOURDAN presents today for injection per the provider's orders.  B12 administration without incident; see MAR for injection details.  Patient tolerated procedure well and without incident.  No questions or complaints noted at this time.  Josepha Pigg presented for Portacath access and flush. Portacath located right chest wall accessed with  H 20 needle.  Good blood return present. Portacath flushed with 47ml NS and 500U/34ml Heparin and needle removed intact.  Procedure tolerated well and without incident.  Discharged ambulatory.

## 2016-09-12 ENCOUNTER — Encounter: Payer: Self-pay | Admitting: Family Medicine

## 2016-09-12 ENCOUNTER — Ambulatory Visit (INDEPENDENT_AMBULATORY_CARE_PROVIDER_SITE_OTHER): Payer: Medicaid Other | Admitting: Family Medicine

## 2016-09-12 VITALS — BP 124/76 | HR 104 | Temp 97.3°F | Resp 18 | Ht 73.0 in | Wt 235.0 lb

## 2016-09-12 DIAGNOSIS — K219 Gastro-esophageal reflux disease without esophagitis: Secondary | ICD-10-CM | POA: Diagnosis not present

## 2016-09-12 DIAGNOSIS — Z85038 Personal history of other malignant neoplasm of large intestine: Secondary | ICD-10-CM

## 2016-09-12 DIAGNOSIS — Z1159 Encounter for screening for other viral diseases: Secondary | ICD-10-CM | POA: Diagnosis not present

## 2016-09-12 DIAGNOSIS — I2510 Atherosclerotic heart disease of native coronary artery without angina pectoris: Secondary | ICD-10-CM | POA: Diagnosis not present

## 2016-09-12 DIAGNOSIS — E785 Hyperlipidemia, unspecified: Secondary | ICD-10-CM | POA: Diagnosis not present

## 2016-09-12 DIAGNOSIS — S20212A Contusion of left front wall of thorax, initial encounter: Secondary | ICD-10-CM

## 2016-09-12 DIAGNOSIS — L409 Psoriasis, unspecified: Secondary | ICD-10-CM | POA: Insufficient documentation

## 2016-09-12 MED ORDER — DEXLANSOPRAZOLE 60 MG PO CPDR: 1.0000 | DELAYED_RELEASE_CAPSULE | Freq: Every day | ORAL | 11 refills | Status: DC

## 2016-09-12 NOTE — Patient Instructions (Addendum)
Come back in a couple of months You are due for flu and pneumonia shots  No change in medicines Walk every day that you are able  Call for medicine refills  I have added a hepatitis C test to your next blood work

## 2016-09-12 NOTE — Progress Notes (Signed)
Chief Complaint  Patient presents with  . Hyperlipidemia   New to office.  He has a history of recurrent falls due to orhtostasis.  Has poor balalance.  Walks with cane.  Unsteady.  This time fell at  home and injured L ribs.  Thinks they may be broken,  Didn't go to ER.  Has had broken ribs before.  Discussed deep breathing and prevention of pneumonia. He sees a Social worker for depression.  Is on prozac and wellbutrin. Prior heavy drinker - considers himself recovered alcoholic.  Still drinks occasionally, but in moderation. Smokes, but also intermittently.  Every few days.  Is advised to quit. Had partial colectomy from colon cancer.  Still under care of oncology. Has CAD, and is on lipitor.  Is under care cardiology Under care neurology.  Takes gabapentin Currently rib pain is biggest acute issue  Patient Active Problem List   Diagnosis Date Noted  . Psoriasis 09/12/2016  . Pulmonary nodule 06/14/2016  . History of colon cancer   . Diarrhea 01/06/2016  . Cigarette nicotine dependence with nicotine-induced disorder 06/18/2015  . Chronic obstructive pulmonary disease ([place]) 06/18/2015  . Depression 06/18/2015  . B12 deficiency 04/14/2015  . Adenocarcinoma of transverse colon ([place]) 02/10/2015  . GERD (gastroesophageal reflux disease) 12/15/2014  . History of colonic polyps 12/15/2014  . History of alcohol abuse 09/16/2014  . Orthostatic syncope 05/07/2013  . Bilateral lower extremity edema 04/24/2012  . Hyperlipidemia 04/21/2009  . CAD, NATIVE VESSEL 03/25/2009  . HEAD TRAUMA 03/02/2009    Outpatient Encounter Prescriptions as of 09/12/2016  Medication Sig  . amitriptyline (ELAVIL) 25 MG tablet Take 1 tablet (25 mg total) by mouth at bedtime.  Marland Kitchen atorvastatin (LIPITOR) 20 MG tablet Take 1 tablet (20 mg total) by mouth at bedtime.  Marland Kitchen buPROPion (WELLBUTRIN SR) 150 MG 12 hr tablet Take 150 mg by mouth 2 (two) times daily.  . cyanocobalamin (,VITAMIN B-12,) 1000 MCG/ML injection  Inject 1,000 mcg into the muscle every 30 (thirty) days.  Marland Kitchen dexlansoprazole (DEXILANT) 60 MG capsule Take 1 capsule (60 mg total) by mouth daily.  Marland Kitchen FLUoxetine (PROZAC) 20 MG capsule TAKE 1 CAPSULE BY MOUTH 2 TIMES A DAY  . gabapentin (NEURONTIN) 300 MG capsule TAKE 4 CAPSULES BY MOUTH EVERY DAY AT BEDTIME (Patient taking differently: TAKE 1 CAP QAM AND 3 CAPSULES BY MOUTH EVERY DAY AT BEDTIME)  . HYDROcodone-acetaminophen (NORCO) 5-325 MG tablet Take 1 tablet by mouth every 6 (six) hours as needed for moderate pain.  Marland Kitchen loperamide (IMODIUM) 2 MG capsule TAKE 1 TO 2 CAPSULES BY MOUTH EVERY 6 HOURS AS NEEDED FOR DIARRHEA OR LOOSE STOOLS. (Patient taking differently: Take 4 mg by mouth daily. TAKE 1 TO 2 CAPSULES BY MOUTH EVERY 6 HOURS AS NEEDED FOR DIARRHEA OR LOOSE STOOLS.)  . PROAIR HFA 108 (90 Base) MCG/ACT inhaler INHALE 2 PUFFS BY MOUTH EVERY 6 HOURS AS NEEDED FOR SHORTNESS OF BREATH/WHEEZING   Facility-Administered Encounter Medications as of 09/12/2016  Medication  . sodium chloride flush (NS) 0.9 % injection 10 mL    Past Medical History:  Diagnosis Date  . Abnormal stress echocardiogram   . Adenocarcinoma of transverse colon ([place]) 02/10/2015  . Alcohol abuse    Heavy Use up until 2010  . Anxiety   . Blood transfusion without reported diagnosis   . COPD (chronic obstructive pulmonary disease) ([place])   . Depression   . Emphysema of lung ([place])   . GERD (gastroesophageal reflux disease)   . Head  trauma 2001   closed head injury; coma for 4 weeks  . Hypercholesterolemia   . Hypertension   . ING HERN W/GANGREN RECUR UNILAT/UNSPEC ING HERN 04/21/2009   Qualifier: Diagnosis of  By: Verl Blalock, MD, Delanna Ahmadi PTSD (post-traumatic stress disorder)   . Pulmonary nodule 06/14/2016  . SAH (subarachnoid hemorrhage) ([place]) 08/31/2012  . SDH (subdural hematoma) ([place]) 08/31/2012    Past Surgical History:  Procedure Laterality Date  . BIOPSY  01/25/2016   Procedure: BIOPSY;  Surgeon: Danie Binder, MD;  [facility];  Service: Endoscopy;;  ileum;   . cardiac cath    . COLONOSCOPY  2011   Dr. Oneida Alar: multiple adenomas and hyperplastic polyps  . COLONOSCOPY WITH PROPOFOL N/A 01/12/2015   Procedure: COLONOSCOPY WITH PROPOFOL;  Surgeon: Danie Binder, MD;  [facility];  Service: Endoscopy;  Laterality: N/A;  1030  . COLONOSCOPY WITH PROPOFOL N/A 01/25/2016   Procedure: COLONOSCOPY WITH PROPOFOL;  Surgeon: Danie Binder, MD;  [facility];  Service: Endoscopy;  Laterality: N/A;  1230  . CRANIOTOMY  2001  . ESOPHAGOGASTRODUODENOSCOPY (EGD) WITH PROPOFOL N/A 01/12/2015   Procedure: ESOPHAGOGASTRODUODENOSCOPY (EGD) WITH PROPOFOL;  Surgeon: Danie Binder, MD;  [facility];  Service: Endoscopy;  Laterality: N/A;  . head injury surgery    . HERNIA REPAIR Right 2012   Inguinal- [place] Na  . KIDNEY SURGERY     >30 years ago  . LAPAROSCOPIC RIGHT HEMI COLECTOMY Left 02/10/2015   Procedure: LAPAROSCOPIC THEN OPEN RIGHT HEMI COLECTOMY;  Surgeon: [name], MD;  Location: [facility];  Service: General;  Laterality: Left;  . POLYPECTOMY  01/25/2016   Procedure: POLYPECTOMY;  Surgeon: Danie Binder, MD;  [facility];  Service: Endoscopy;;  colon  . PORTACATH PLACEMENT N/A 03/24/2015   Procedure: INSERTION PORT-A-CATH;  Surgeon: Jules Husbands, MD;  Location: [facility];  Service: General;  Laterality: N/A;    Social History   Social History  . Marital status: Divorced    Spouse name: N/A  . Number of children: 2  . Years of education: 12   Occupational History  . disabled/unemployed    Social History Main Topics  . Smoking status: Current Some Day Smoker    Packs/day: 0.10    Years: 30.00    Types: Cigarettes    Start date: 02/06/1973  . Smokeless tobacco: Never Used     Comment: smokes every 2-3 days, 2-3 a day when smokes  . Alcohol use 0.0 oz/week     Comment: beer occ, history of ETOH abuse in remote past.   . Drug  use: No     Comment: marijuana last 2012  . Sexual activity: No   Other Topics Concern  . Not on file   Social History Narrative   Single   Lives alone   Turned down for disability       Family History  Problem Relation Age of Onset  . Pulmonary embolism Mother   . Breast cancer Mother   . Cancer Mother        breast cancer  . Arthritis Mother   . Heart disease Father   . Cancer Father 2       Leukemia  . Cancer Paternal Grandfather        Lung  . Ataxia Neg Hx   . Chorea Neg Hx   . Dementia Neg Hx   . Mental retardation Neg  Hx   . Migraines Neg Hx   . Multiple sclerosis Neg Hx   . Neurofibromatosis Neg Hx   . Neuropathy Neg Hx   . Parkinsonism Neg Hx   . Seizures Neg Hx   . Stroke Neg Hx   . Colon cancer Neg Hx     Review of Systems  Constitutional: Positive for malaise/fatigue. Negative for chills, fever and weight loss.  HENT: Negative for congestion and hearing loss.   Eyes: Negative for blurred vision and pain.  Respiratory: Negative for cough and shortness of breath.   Cardiovascular: Positive for chest pain. Negative for leg swelling.       Ribs  Gastrointestinal: Negative for abdominal pain, constipation, diarrhea and heartburn.  Genitourinary: Negative for dysuria and frequency.  Musculoskeletal: Positive for falls. Negative for joint pain and myalgias.  Skin: Positive for rash.       psoriasis  Neurological: Positive for loss of consciousness. Negative for dizziness, seizures and headaches.       Syncope  Psychiatric/Behavioral: Positive for depression and substance abuse. The patient is nervous/anxious. The patient does not have insomnia.     BP 124/76 (BP Location: Right Arm, Patient Position: Sitting, Cuff Size: Large)   Pulse (!) 104   Temp (!) 97.3 F (36.3 C) (Temporal)   Resp 18   Ht 6\' 1"  (1.854 m)   Wt 235 lb (106.6 kg)   SpO2 96%   BMI 31.00 kg/m   Physical Exam  Constitutional: He is oriented to person, place, and time. He  appears well-developed and well-nourished. No distress.  HENT:  Head: Normocephalic and atraumatic.  Mouth/Throat: Oropharynx is clear and moist.  Eyes: Pupils are equal, round, and reactive to light. EOM are normal.  Neck: Normal range of motion. Neck supple.  Cardiovascular: Normal rate, regular rhythm and normal heart sounds.   Pulmonary/Chest: Effort normal and breath sounds normal. No respiratory distress.  Abdominal: Soft. Bowel sounds are normal.  Musculoskeletal: Normal range of motion. He exhibits no edema.  Lymphadenopathy:    He has no cervical adenopathy.  Neurological: He is alert and oriented to person, place, and time. Coordination abnormal.  Balance poor  Skin:  Active psoriasis both shins with silver plaque on red base  Psychiatric: He has a normal mood and affect. His behavior is normal.   ASSESSMENT/PLAN:  1. Psoriasis Offered cream  2. Atherosclerosis of native coronary artery of native heart without angina pectoris Under care cardiology  3. Gastroesophageal reflux disease, esophagitis presence not specified On dexilant  4. Hyperlipidemia, unspecified hyperlipidemia type On lipitor  5. History of colon cancer Sees oncology  6. Encounter for hepatitis C screening test for low risk patient Discussed need for test - Hepatitis C antibody   Patient Instructions  Come back in a couple of months You are due for flu and pneumonia shots  No change in medicines Walk every day that you are able  Call for medicine refills  I have added a hepatitis C test to your next blood work   Raylene Everts, MD

## 2016-09-14 ENCOUNTER — Encounter (HOSPITAL_BASED_OUTPATIENT_CLINIC_OR_DEPARTMENT_OTHER): Payer: Medicaid Other

## 2016-09-14 ENCOUNTER — Encounter (HOSPITAL_BASED_OUTPATIENT_CLINIC_OR_DEPARTMENT_OTHER): Payer: Medicaid Other | Admitting: Adult Health

## 2016-09-14 ENCOUNTER — Encounter (HOSPITAL_COMMUNITY): Payer: Medicaid Other | Attending: Hematology & Oncology

## 2016-09-14 ENCOUNTER — Encounter (HOSPITAL_COMMUNITY): Payer: Self-pay | Admitting: Adult Health

## 2016-09-14 VITALS — BP 156/94 | HR 93 | Temp 98.5°F | Wt 233.4 lb

## 2016-09-14 DIAGNOSIS — R911 Solitary pulmonary nodule: Secondary | ICD-10-CM | POA: Diagnosis not present

## 2016-09-14 DIAGNOSIS — E538 Deficiency of other specified B group vitamins: Secondary | ICD-10-CM | POA: Diagnosis not present

## 2016-09-14 DIAGNOSIS — C184 Malignant neoplasm of transverse colon: Secondary | ICD-10-CM | POA: Diagnosis present

## 2016-09-14 DIAGNOSIS — G8929 Other chronic pain: Secondary | ICD-10-CM | POA: Diagnosis not present

## 2016-09-14 DIAGNOSIS — Z1159 Encounter for screening for other viral diseases: Secondary | ICD-10-CM

## 2016-09-14 DIAGNOSIS — R109 Unspecified abdominal pain: Secondary | ICD-10-CM | POA: Diagnosis not present

## 2016-09-14 LAB — CBC WITH DIFFERENTIAL/PLATELET
Basophils Absolute: 0 10*3/uL (ref 0.0–0.1)
Basophils Relative: 0 %
EOS ABS: 0.2 10*3/uL (ref 0.0–0.7)
EOS PCT: 3 %
HCT: 43.2 % (ref 39.0–52.0)
Hemoglobin: 14.4 g/dL (ref 13.0–17.0)
LYMPHS ABS: 2 10*3/uL (ref 0.7–4.0)
Lymphocytes Relative: 25 %
MCH: 30.7 pg (ref 26.0–34.0)
MCHC: 33.3 g/dL (ref 30.0–36.0)
MCV: 92.1 fL (ref 78.0–100.0)
Monocytes Absolute: 0.6 10*3/uL (ref 0.1–1.0)
Monocytes Relative: 7 %
NEUTROS PCT: 65 %
Neutro Abs: 5.1 10*3/uL (ref 1.7–7.7)
Platelets: 228 10*3/uL (ref 150–400)
RBC: 4.69 MIL/uL (ref 4.22–5.81)
RDW: 13.4 % (ref 11.5–15.5)
WBC: 7.9 10*3/uL (ref 4.0–10.5)

## 2016-09-14 LAB — IRON AND TIBC
Iron: 89 ug/dL (ref 45–182)
Saturation Ratios: 26 % (ref 17.9–39.5)
TIBC: 336 ug/dL (ref 250–450)
UIBC: 247 ug/dL

## 2016-09-14 LAB — COMPREHENSIVE METABOLIC PANEL
ALBUMIN: 4 g/dL (ref 3.5–5.0)
ALK PHOS: 75 U/L (ref 38–126)
ALT: 12 U/L — ABNORMAL LOW (ref 17–63)
AST: 21 U/L (ref 15–41)
Anion gap: 9 (ref 5–15)
BILIRUBIN TOTAL: 0.6 mg/dL (ref 0.3–1.2)
BUN: 9 mg/dL (ref 6–20)
CALCIUM: 9 mg/dL (ref 8.9–10.3)
CO2: 29 mmol/L (ref 22–32)
CREATININE: 0.87 mg/dL (ref 0.61–1.24)
Chloride: 100 mmol/L — ABNORMAL LOW (ref 101–111)
GFR calc Af Amer: >60 mL/min (ref >60)
GFR calc non Af Amer: >60 mL/min (ref >60)
GLUCOSE: 106 mg/dL — AB (ref 65–99)
Potassium: 4.1 mmol/L (ref 3.5–5.1)
Sodium: 138 mmol/L (ref 135–145)
TOTAL PROTEIN: 7.8 g/dL (ref 6.5–8.1)

## 2016-09-14 LAB — VITAMIN B12: Vitamin B-12: 361 pg/mL (ref 180–914)

## 2016-09-14 LAB — FERRITIN: Ferritin: 82 ng/mL (ref 24–336)

## 2016-09-14 MED ORDER — CYANOCOBALAMIN 1000 MCG/ML IJ SOLN
1000.0000 ug | Freq: Once | INTRAMUSCULAR | Status: AC
  Administered 2016-09-14: 1000 ug via INTRAMUSCULAR
  Filled 2016-09-14: qty 1

## 2016-09-14 MED ORDER — HYDROCODONE-ACETAMINOPHEN 5-325 MG PO TABS: 1.0000 | ORAL_TABLET | Freq: Four times a day (QID) | ORAL | 0 refills | Status: DC | PRN

## 2016-09-14 MED ORDER — HEPARIN SOD (PORK) LOCK FLUSH 100 UNIT/ML IV SOLN
INTRAVENOUS | Status: AC
  Filled 2016-09-14: qty 5
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
## 2016-09-14 NOTE — Patient Instructions (Signed)
[facility] at [facility] Discharge Instructions  RECOMMENDATIONS MADE BY THE CONSULTANT AND ANY TEST RESULTS WILL BE SENT TO YOUR REFERRING PHYSICIAN.  Received Vit B12 injection. Follow-up as scheduled. Call clinic for any questions or concerns  Thank you for choosing [facility] at [facility] to provide your oncology and hematology care.  To afford each patient quality time with our provider, please arrive at least 15 minutes before your scheduled appointment time.    If you have a lab appointment with the Cancer Center please come in thru the  Main Entrance and check in at the main information desk  You need to re-schedule your appointment should you arrive 10 or more minutes late.  We strive to give you quality time with our providers, and arriving late affects you and other patients whose appointments are after yours.  Also, if you no show three or more times for appointments you may be dismissed from the clinic at the providers discretion.     Again, thank you for choosing [facility].  Our hope is that these requests will decrease the amount of time that you wait before being seen by our physicians.       _____________________________________________________________  Should you have questions after your visit to [facility], please contact our office at [phone] between the hours of 8:30 a.m. and 4:30 p.m.  Voicemails left after 4:30 p.m. will not be returned until the following business day.  For prescription refill requests, have your pharmacy contact our office.       Resources For Cancer Patients and their Caregivers ? American Cancer Society: Can assist with transportation, wigs, general needs, runs Look Good Feel Better.        1-888-227-6333 ? Cancer Care: Provides financial assistance, online support groups, medication/co-pay assistance.  1-800-813-HOPE (4673) ? [facility] Assists [place] cancer patients and their families through emotional , educational and financial support.  336-427-4357 ? [place] DSS Where to apply for food stamps, Medicaid and utility assistance. 336-342-1394 ? RCATS: Transportation to medical appointments. 336-347-2287 ? Social Security Administration: May apply for disability if have a Stage IV cancer. 336-342-7796 1-800-772-1213 ? [place] Aging, Disability and Transit Services: Assists with nutrition, care and transit needs. 336-349-2343  Cancer Center Support Programs: @10RELATIVEDAYS@ > Cancer Support Group  2nd Tuesday of the month 1pm-2pm, Journey Room  > Creative Journey  3rd Tuesday of the month 1130am-1pm, Journey Room  > Look Good Feel Better  1st Wednesday of the month 10am-12 noon, Journey Room (Call American Cancer Society to register 1-800-395-5775)   

## 2016-09-14 NOTE — Progress Notes (Unsigned)
Jr Milliron Brossart tolerated Vit B12 injection well without complaints or incident. Pt discharged self ambulatory in satisfactory condition

## 2016-09-14 NOTE — Patient Instructions (Signed)
[place] at [facility] Discharge Instructions  RECOMMENDATIONS MADE BY THE CONSULTANT AND ANY TEST RESULTS WILL BE SENT TO YOUR REFERRING PHYSICIAN.  You were seen today by Mike Craze NP. B12 injection given today. Continue monthly B12 injections. CT in September. Return in 4 months for labs, port flush and follow up.  Thank you for choosing [facility] at [facility] to provide your oncology and hematology care.  To afford each patient quality time with our provider, please arrive at least 15 minutes before your scheduled appointment time.    If you have a lab appointment with the [facility] please come in thru the  Main Entrance and check in at the main information desk  You need to re-schedule your appointment should you arrive 10 or more minutes late.  We strive to give you quality time with our providers, and arriving late affects you and other patients whose appointments are after yours.  Also, if you no show three or more times for appointments you may be dismissed from the clinic at the providers discretion.     Again, thank you for choosing [facility].  Our hope is that these requests will decrease the amount of time that you wait before being seen by our physicians.       _____________________________________________________________  Should you have questions after your visit to [facility], please contact our office at [phone] between the hours of 8:30 a.m. and 4:30 p.m.  Voicemails left after 4:30 p.m. will not be returned until the following business day.  For prescription refill requests, have your pharmacy contact our office.       Resources For Cancer Patients and their Caregivers ? American Cancer Society: Can assist with transportation, wigs, general needs, runs Look Good Feel Better.        (986)800-2421 ? Cancer Care: Provides financial assistance, online support groups,  medication/co-pay assistance.  1-800-813-HOPE 657-354-7576) ? [facility] Assists [place] cancer patients and their families through emotional , educational and financial support.  361 611 0098 ? [place] DSS Where to apply for food stamps, Medicaid and utility assistance. 402-834-9762 ? RCATS: Transportation to medical appointments. 479-226-9197 ? Social Security Administration: May apply for disability if have a Stage IV cancer. 331 040 8986 212-447-9630 ? [employer], Disability and Transit Services: Assists with nutrition, care and transit needs. [place] Support Programs: @10RELATIVEDAYS @ > Cancer Support Group  2nd Tuesday of the month 1pm-2pm, Journey Room  > Creative Journey  3rd Tuesday of the month 1130am-1pm, Journey Room  > Look Good Feel Better  1st Wednesday of the month 10am-12 noon, Journey Room (Call [place] to register [phone])

## 2016-09-15 ENCOUNTER — Encounter: Payer: Self-pay | Admitting: Family Medicine

## 2016-09-15 LAB — CEA: CEA: 2.5 ng/mL (ref 0.0–4.7)

## 2016-09-15 LAB — HEPATITIS C ANTIBODY: HCV Ab: <0.1 s/co ratio (ref 0.0–0.9)

## 2016-09-19 ENCOUNTER — Ambulatory Visit: Payer: Self-pay | Admitting: Physician Assistant

## 2016-10-03 NOTE — Congregational Nurse Program (Signed)
Congregational Nurse Program Note  Date of Encounter: 09/19/2016  Past Medical History: Past Medical History:  Diagnosis Date  . Abnormal stress echocardiogram   . Adenocarcinoma of transverse colon ([place]) 02/10/2015  . Alcohol abuse    Heavy Use up until 2010  . Anxiety   . Blood transfusion without reported diagnosis   . COPD (chronic obstructive pulmonary disease) ([place])   . Depression   . Emphysema of lung ([place])   . GERD (gastroesophageal reflux disease)   . Head trauma 2001   closed head injury; coma for 4 weeks  . Hypercholesterolemia   . Hypertension   . ING HERN W/GANGREN RECUR UNILAT/UNSPEC ING HERN 04/21/2009   Qualifier: Diagnosis of  By: Verl Blalock, MD, Delanna Ahmadi PTSD (post-traumatic stress disorder)   . Pulmonary nodule 06/14/2016  . SAH (subarachnoid hemorrhage) ([place]) 08/31/2012  . SDH (subdural hematoma) ([place]) 08/31/2012    Encounter Details:     CNP Questionnaire - 09/19/16 1503      Patient Demographics   Is this a new or existing patient? Existing   Patient is considered a/an Not Applicable   Race Caucasian/White     Patient Assistance   Location of Patient Assistance Not Applicable   Patient's financial/insurance status Low Income;Medicaid   Uninsured Patient ([facility]) No   Patient referred to apply for the following financial assistance Not Applicable   Food insecurities addressed Not Applicable   Transportation assistance No   Assistance securing medications Yes   Educational health offerings Medications     Encounter Details   Primary purpose of visit Other   Was an Emergency Department visit averted? Not Applicable   Does patient have a medical provider? Yes   Patient referred to Not Applicable   Was a mental health screening completed? (GAINS tool) No   Does patient have dental issues? No   Does patient have vision issues? No   Does your patient have an abnormal blood pressure today? No   Since previous encounter, have you  referred patient for abnormal blood pressure that resulted in a new diagnosis or medication change? No   Does your patient have an abnormal blood glucose today? No   Since previous encounter, have you referred patient for abnormal blood glucose that resulted in a new diagnosis or medication change? No   Was there a life-saving intervention made? No         Clinical Intake - 09/12/16 0905      Pain   Pain  0-10   Pain Score 10-Worst pain ever   Pain Type Acute pain   Pain Location Rib cage   Pain Orientation Left   Pain Descriptors / Indicators Sharp   Pain Onset In the past 7 days   Pain Frequency Constant     Social worker met with client to check in. Client asked for assistance with making sure his medication would be delivered to him. Social worker Designer, fashion/clothing with client and client was told his medication is scheduled to be delivered. Ubaldo Glassing, MSW, [phone]

## 2016-10-13 ENCOUNTER — Other Ambulatory Visit (HOSPITAL_COMMUNITY): Payer: Self-pay | Admitting: Oncology

## 2016-10-13 DIAGNOSIS — C184 Malignant neoplasm of transverse colon: Secondary | ICD-10-CM

## 2016-10-17 NOTE — Congregational Nurse Program (Signed)
Congregational Nurse Program Note  Date of Encounter: 10/17/2016  Past Medical History: Past Medical History:  Diagnosis Date  . Abnormal stress echocardiogram   . Adenocarcinoma of transverse colon ([place]) 02/10/2015  . Alcohol abuse    Heavy Use up until 2010  . Anxiety   . Blood transfusion without reported diagnosis   . COPD (chronic obstructive pulmonary disease) ([place])   . Depression   . Emphysema of lung ([place])   . GERD (gastroesophageal reflux disease)   . Head trauma 2001   closed head injury; coma for 4 weeks  . Hypercholesterolemia   . Hypertension   . ING HERN W/GANGREN RECUR UNILAT/UNSPEC ING HERN 04/21/2009   Qualifier: Diagnosis of  By: Verl Blalock, MD, Delanna Ahmadi PTSD (post-traumatic stress disorder)   . Pulmonary nodule 06/14/2016  . SAH (subarachnoid hemorrhage) ([place]) 08/31/2012  . SDH (subdural hematoma) ([place]) 08/31/2012    Encounter Details:     CNP Questionnaire - 10/17/16 1033      Patient Demographics   Is this a new or existing patient? (P)  Existing   Patient is considered a/an (P)  Not Applicable   Race (P)  Caucasian/White     Patient Assistance   Location of Patient Assistance (P)  Not Applicable   Patient's financial/insurance status (P)  Low Income;Medicaid   Uninsured Patient ([facility]) (P)  No   Patient referred to apply for the following financial assistance (P)  Not Applicable   Food insecurities addressed (P)  Not Applicable   Transportation assistance (P)  Yes   Type of Assistance (P)  Other   Educational health offerings (P)  Other     Encounter Details   Primary purpose of visit (P)  Other   Was an Emergency Department visit averted? (P)  Not Applicable   Does patient have a medical provider? (P)  Yes   Patient referred to (P)  Not Applicable   Was a mental health screening completed? (GAINS tool) (P)  No   Does patient have dental issues? (P)  No   Does patient have vision issues? (P)  No   Does your patient have  an abnormal blood pressure today? (P)  No   Since previous encounter, have you referred patient for abnormal blood pressure that resulted in a new diagnosis or medication change? (P)  No   Does your patient have an abnormal blood glucose today? (P)  No   Since previous encounter, have you referred patient for abnormal blood glucose that resulted in a new diagnosis or medication change? (P)  No   Was there a life-saving intervention made? (P)  No    Education officer, museum met with client at [facility] for last visit with client to check in and make sure he is currently receiving all services needed (medicaitons are being mailed to him and transportation assistance (bus passes through [employer]) Education officer, museum provided client with bus passes provided by Congregational Nurse Mayra Reel, RN and Mardene Celeste will continue to follow up with client Ubaldo Glassing, MSW, [phone]

## 2016-10-19 ENCOUNTER — Encounter (HOSPITAL_COMMUNITY): Payer: Self-pay

## 2016-10-19 ENCOUNTER — Encounter (HOSPITAL_COMMUNITY): Payer: Medicaid Other | Attending: Hematology & Oncology

## 2016-10-19 ENCOUNTER — Ambulatory Visit (HOSPITAL_COMMUNITY)
Admission: RE | Admit: 2016-10-19 | Discharge: 2016-10-19 | Disposition: A | Discharge status: Home / Self Care | Payer: Medicaid Other | Source: Ambulatory Visit | Attending: Adult Health | Admitting: Adult Health

## 2016-10-19 VITALS — BP 151/86 | HR 98 | Temp 98.0°F | Resp 20 | Wt 238.3 lb

## 2016-10-19 DIAGNOSIS — I700 Atherosclerosis of aorta: Secondary | ICD-10-CM | POA: Diagnosis not present

## 2016-10-19 DIAGNOSIS — C184 Malignant neoplasm of transverse colon: Secondary | ICD-10-CM | POA: Insufficient documentation

## 2016-10-19 DIAGNOSIS — E538 Deficiency of other specified B group vitamins: Secondary | ICD-10-CM | POA: Diagnosis not present

## 2016-10-19 MED ORDER — SODIUM CHLORIDE 0.9% FLUSH
10.0000 mL | INTRAVENOUS | Status: DC | PRN
  Administered 2016-10-19: 10 mL
  Filled 2016-10-19: qty 10

## 2016-10-19 MED ORDER — IOPAMIDOL (ISOVUE-300) INJECTION 61%
100.0000 mL | Freq: Once | INTRAVENOUS | Status: AC | PRN
Start: 2016-10-19 — End: 2016-10-19
  Administered 2016-10-19: 100 mL via INTRAVENOUS

## 2016-10-19 MED ORDER — HEPARIN SOD (PORK) LOCK FLUSH 100 UNIT/ML IV SOLN
INTRAVENOUS | Status: AC
  Administered 2016-10-19: 500 [IU]
  Filled 2016-10-19: qty 5

## 2016-10-19 MED ORDER — HEPARIN SOD (PORK) LOCK FLUSH 100 UNIT/ML IV SOLN
INTRAVENOUS | Status: AC
  Filled 2016-10-19: qty 5

## 2016-10-19 MED ORDER — CYANOCOBALAMIN 1000 MCG/ML IJ SOLN
INTRAMUSCULAR | Status: AC
  Filled 2016-10-19: qty 1

## 2016-10-19 MED ORDER — CYANOCOBALAMIN 1000 MCG/ML IJ SOLN
1000.0000 ug | Freq: Once | INTRAMUSCULAR | Status: AC
  Administered 2016-10-19: 1000 ug via INTRAMUSCULAR

## 2016-10-19 NOTE — Patient Instructions (Signed)
[place] at [facility] Discharge Instructions  RECOMMENDATIONS MADE BY THE CONSULTANT AND ANY TEST RESULTS WILL BE SENT TO YOUR REFERRING PHYSI  Thank you for choosing [place] at [facility] to provide your oncology and hematology care.  To afford each patient quality time with our provider, please arrive at least 15 minutes before your scheduled appointment time.    If you have a lab appointment with the [place] please come in thru the  Main Entrance and check in at the main information desk  You need to re-schedule your appointment should you arrive 10 or more minutes late.  We strive to give you quality time with our providers, and arriving late affects you and other patients whose appointments are after yours.  Also, if you no show three or more times for appointments you may be dismissed from the clinic at the providers discretion.     Again, thank you for choosing [facility].  Our hope is that these requests will decrease the amount of time that you wait before being seen by our physicians.       _____________________________________________________________  Should you have questions after your visit to [facility], please contact our office at [phone] between the hours of 8:30 a.m. and 4:30 p.m.  Voicemails left after 4:30 p.m. will not be returned until the following business day.  For prescription refill requests, have your pharmacy contact our office.       Resources For Cancer Patients and their Caregivers ? American Cancer Society: Can assist with transportation, wigs, general needs, runs Look Good Feel Better.        [phone] ? Cancer Care: Provides financial assistance, online support groups, medication/co-pay assistance.  1-800-813-HOPE (636) 067-4259) ? [facility] Assists [place] cancer patients and their families through emotional , educational and  financial support.  (651)255-0892 ? [place] DSS Where to apply for food stamps, Medicaid and utility assistance. 626-506-0595 ? RCATS: Transportation to medical appointments. (406) 479-6364 ? Social Security Administration: May apply for disability if have a Stage IV cancer. 224 821 0398 989-570-2352 ? [employer], Disability and Transit Services: Assists with nutrition, care and transit needs. [place] Support Programs: @10RELATIVEDAYS @ > Cancer Support Group  2nd Tuesday of the month 1pm-2pm, Journey Room  > Creative Journey  3rd Tuesday of the month 1130am-1pm, Journey Room  > Look Good Feel Better  1st Wednesday of the month 10am-12 noon, Journey Room (Call [place] to register [phone])

## 2016-10-19 NOTE — Progress Notes (Signed)
Erik [name] presents today for injection per MD orders. B12 1,082mcg administered SQ in right Upper Arm. Administration without incident. Patient tolerated well.   Erik [name] presented for Portacath access and flush. Portacath located right chest wall accessed with  H 20 needle for his CT scan today. Good blood return present. Portacath flushed with 10 ml NS Procedure without incident. Patient tolerated procedure well.   Treatment given per orders. Patient tolerated it well without problems. Vitals stable and discharged home from clinic ambulatory. Follow up as scheduled.

## 2016-11-10 ENCOUNTER — Other Ambulatory Visit (HOSPITAL_COMMUNITY): Payer: Self-pay | Admitting: Oncology

## 2016-11-10 DIAGNOSIS — T451X5A Adverse effect of antineoplastic and immunosuppressive drugs, initial encounter: Principal | ICD-10-CM

## 2016-11-10 DIAGNOSIS — G620 Drug-induced polyneuropathy: Secondary | ICD-10-CM

## 2016-11-16 ENCOUNTER — Encounter (HOSPITAL_COMMUNITY): Payer: Self-pay

## 2016-11-16 ENCOUNTER — Encounter (HOSPITAL_COMMUNITY): Payer: Medicaid Other | Attending: Hematology & Oncology

## 2016-11-16 VITALS — BP 146/80 | HR 91 | Temp 98.5°F | Resp 20

## 2016-11-16 DIAGNOSIS — E538 Deficiency of other specified B group vitamins: Secondary | ICD-10-CM

## 2016-11-16 DIAGNOSIS — R911 Solitary pulmonary nodule: Secondary | ICD-10-CM

## 2016-11-16 DIAGNOSIS — C184 Malignant neoplasm of transverse colon: Secondary | ICD-10-CM | POA: Insufficient documentation

## 2016-11-16 MED ORDER — CYANOCOBALAMIN 1000 MCG/ML IJ SOLN
INTRAMUSCULAR | Status: AC
  Filled 2016-11-16: qty 1

## 2016-11-16 MED ORDER — CYANOCOBALAMIN 1000 MCG/ML IJ SOLN
1000.0000 ug | Freq: Once | INTRAMUSCULAR | Status: AC
  Administered 2016-11-16: 1000 ug via INTRAMUSCULAR

## 2016-11-16 MED ORDER — HYDROCODONE-ACETAMINOPHEN 5-325 MG PO TABS: 1.0000 | ORAL_TABLET | Freq: Four times a day (QID) | ORAL | 0 refills | Status: DC | PRN

## 2016-11-16 NOTE — Progress Notes (Signed)
Erik [name] presents today for injection per the provider's orders.  B12 administration without incident; see MAR for injection details.  Patient tolerated procedure well and without incident.  No questions or complaints noted at this time.  Discharged ambulatory.

## 2016-11-17 ENCOUNTER — Ambulatory Visit: Payer: Medicaid Other | Admitting: Family Medicine

## 2016-11-27 ENCOUNTER — Encounter: Payer: Self-pay | Admitting: Family Medicine

## 2016-11-27 ENCOUNTER — Ambulatory Visit (INDEPENDENT_AMBULATORY_CARE_PROVIDER_SITE_OTHER): Payer: Medicaid Other | Admitting: Family Medicine

## 2016-11-27 VITALS — BP 138/78 | HR 88 | Temp 97.4°F | Resp 18 | Ht 73.0 in | Wt 233.1 lb

## 2016-11-27 DIAGNOSIS — F329 Major depressive disorder, single episode, unspecified: Secondary | ICD-10-CM

## 2016-11-27 DIAGNOSIS — I2510 Atherosclerotic heart disease of native coronary artery without angina pectoris: Secondary | ICD-10-CM | POA: Diagnosis not present

## 2016-11-27 DIAGNOSIS — Z23 Encounter for immunization: Secondary | ICD-10-CM | POA: Diagnosis not present

## 2016-11-27 DIAGNOSIS — F17219 Nicotine dependence, cigarettes, with unspecified nicotine-induced disorders: Secondary | ICD-10-CM | POA: Diagnosis not present

## 2016-11-27 DIAGNOSIS — F32A Depression, unspecified: Secondary | ICD-10-CM

## 2016-11-27 DIAGNOSIS — E785 Hyperlipidemia, unspecified: Secondary | ICD-10-CM | POA: Diagnosis not present

## 2016-11-27 NOTE — Progress Notes (Signed)
Chief Complaint  Patient presents with  . Follow-up   Patient is here for routine follow-up. He denies any chest pain or palpitations.  He has chronic mild shortness of breath.  He continues to smoke cigarettes.  He states he has cut down to half of his prior use. He is abstaining from alcohol. He complains of chronic diarrhea.  He tries to eat frequent small meals a day.  He has had problems ever since his colon cancer surgery. He is due for immunizations today.  He is going to get a flu shot and a Prevnar. He is compliant with his medications.  He is on a statin.  Last LDL was 72.  Blood pressure is controlled. He sees multiple specialists for care.  He is discouraged that he is still not yet approved for disability. He is on Prozac and Wellbutrin for depression with Elavil as needed at night. Patient Active Problem List   Diagnosis Date Noted  . Psoriasis 09/12/2016  . Pulmonary nodule 06/14/2016  . History of colon cancer   . Diarrhea 01/06/2016  . Cigarette nicotine dependence with nicotine-induced disorder 06/18/2015  . Chronic obstructive pulmonary disease ([place]) 06/18/2015  . Depression 06/18/2015  . B12 deficiency 04/14/2015  . Adenocarcinoma of transverse colon ([place]) 02/10/2015  . GERD (gastroesophageal reflux disease) 12/15/2014  . History of colonic polyps 12/15/2014  . History of alcohol abuse 09/16/2014  . Panic disorder 10/10/2012  . Major depressive disorder, single episode 10/10/2012  . Bilateral lower extremity edema 04/24/2012  . Hyperlipidemia 04/21/2009  . CAD, NATIVE VESSEL 03/25/2009  . HEAD TRAUMA 03/02/2009    Outpatient Encounter Prescriptions as of 11/27/2016  Medication Sig  . amitriptyline (ELAVIL) 25 MG tablet TAKE 1 TABLET BY MOUTH EVERY NIGHT AT BEDTIME  . atorvastatin (LIPITOR) 20 MG tablet Take 1 tablet (20 mg total) by mouth at bedtime.  [place] buPROPion (WELLBUTRIN SR) 150 MG 12 hr tablet Take 150 mg by mouth 2 (two) times daily.  .  cyanocobalamin (,VITAMIN B-12,) 1000 MCG/ML injection Inject 1,000 mcg into the muscle every 30 (thirty) days.  Mar[place] Kitchen dexlansoprazole (DEXILANT) 60 MG capsule Take 1 capsule (60 mg total) by mouth daily.  [place] FLUoxetine (PROZAC) 20 MG capsule TAKE 1 CAPSULE BY MOUTH 2 TIMES A DAY  . gabapentin (NEURONTIN) 300 MG capsule TAKE 4 CAPSULES BY MOUTH EVERY DAY AT BEDTIME (Patient taking differently: TAKE 1 CAP QAM AND 3 CAPSULES BY MOUTH EVERY DAY AT BEDTIME)  . gabapentin (NEURONTIN) 300 MG capsule TAKE 4 CAPSULES BY MOUTH EVERY DAY AT BEDTIME  . HYDROcodone-acetaminophen (NORCO) 5-325 MG tablet Take 1 tablet by mouth every 6 (six) hours as needed for moderate pain.  [place] Kitchen loperamide (IMODIUM) 2 MG capsule TAKE 1 TO 2 CAPSULES BY MOUTH EVERY 6 HOURS AS NEEDED FOR DIARRHEA OR LOOSE STOOLS. (Patient taking differently: Take 4 mg by mouth daily. TAKE 1 TO 2 CAPSULES BY MOUTH EVERY 6 HOURS AS NEEDED FOR DIARRHEA OR LOOSE STOOLS.)  . PROAIR HFA 108 (90 Base) MCG/ACT inhaler INHALE 2 PUFFS BY MOUTH EVERY 6 HOURS AS NEEDED FOR SHORTNESS OF BREATH/WHEEZING   Facility-Administered Encounter Medications as of 11/27/2016  Medication  . sodium chloride flush (NS) 0.9 % injection 10 mL    No Known Allergies  Review of Systems  Constitutional: Positive for fatigue. Negative for activity change, appetite change and unexpected weight change.  HENT: Negative for congestion and dental problem.   Eyes: Negative for photophobia and visual disturbance.  Respiratory: Positive for  shortness of breath. Negative for chest tightness and wheezing.   Cardiovascular: Negative for chest pain, palpitations and leg swelling.  Gastrointestinal: Positive for diarrhea. Negative for blood in stool and constipation.  Genitourinary: Negative for difficulty urinating and frequency.  Musculoskeletal: Positive for arthralgias and back pain.  Neurological: Negative for light-headedness and headaches.  Psychiatric/Behavioral: Positive for  dysphoric mood. Negative for self-injury, sleep disturbance and suicidal ideas. The patient is nervous/anxious.     BP 138/78   Pulse 88   Temp (!) 97.4 F (36.3 C) (Temporal)   Resp 18   Ht 6\' 1"  (1.854 m)   Wt 233 lb 1.3 oz (105.7 kg)   SpO2 97%   BMI 30.75 kg/m   Physical Exam  Constitutional: He is oriented to person, place, and time. He appears well-developed and well-nourished. No distress.  Smells of tobacco/body odor  HENT:  Head: Normocephalic and atraumatic.  Mouth/Throat: Oropharynx is clear and moist.  Eyes: Pupils are equal, round, and reactive to light. EOM are normal.  Neck: Normal range of motion. Neck supple.  Cardiovascular: Normal rate, regular rhythm and normal heart sounds.   Pulmonary/Chest: Effort normal and breath sounds normal. No respiratory distress.  Abdominal: Soft. Bowel sounds are normal.  Musculoskeletal: Normal range of motion. He exhibits no edema.  Lymphadenopathy:    He has no cervical adenopathy.  Neurological: He is alert and oriented to person, place, and time. Coordination abnormal.  Balance poor  Skin:  Active psoriasis both shins with silver plaque on red base  Psychiatric: He has a normal mood and affect. His behavior is normal.    ASSESSMENT/PLAN:  1. Atherosclerosis of native coronary artery of native heart without angina pectoris  2. Hyperlipidemia, unspecified hyperlipidemia type  3. Cigarette nicotine dependence with nicotine-induced disorder  4. Depression, unspecified depression type  5. Need for influenza vaccination - Flu Vaccine QUAD 36+ mos IM  6. Need for pneumococcal vaccination - Pneumococcal conjugate vaccine 13-valent IM   Patient Instructions  Continue current medicine Stay as active as you are able  See me in 3 months  Call if needed sooner    Raylene Everts, MD

## 2016-11-27 NOTE — Patient Instructions (Signed)
Continue current medicine Stay as active as you are able  See me in 3 months  Call if needed sooner

## 2016-11-30 ENCOUNTER — Ambulatory Visit: Payer: Medicaid Other | Admitting: Family Medicine

## 2016-12-08 ENCOUNTER — Other Ambulatory Visit (HOSPITAL_COMMUNITY): Payer: Self-pay | Admitting: Adult Health

## 2016-12-08 ENCOUNTER — Other Ambulatory Visit: Payer: Self-pay | Admitting: Physician Assistant

## 2016-12-08 DIAGNOSIS — C184 Malignant neoplasm of transverse colon: Secondary | ICD-10-CM

## 2016-12-18 ENCOUNTER — Ambulatory Visit (INDEPENDENT_AMBULATORY_CARE_PROVIDER_SITE_OTHER): Payer: Medicaid Other | Admitting: Neurology

## 2016-12-18 ENCOUNTER — Encounter: Payer: Self-pay | Admitting: Neurology

## 2016-12-18 VITALS — BP 138/72 | HR 83 | Ht 74.0 in | Wt 234.0 lb

## 2016-12-18 DIAGNOSIS — T451X5A Adverse effect of antineoplastic and immunosuppressive drugs, initial encounter: Secondary | ICD-10-CM

## 2016-12-18 DIAGNOSIS — G620 Drug-induced polyneuropathy: Secondary | ICD-10-CM

## 2016-12-18 DIAGNOSIS — G44219 Episodic tension-type headache, not intractable: Secondary | ICD-10-CM | POA: Diagnosis not present

## 2016-12-18 DIAGNOSIS — F17200 Nicotine dependence, unspecified, uncomplicated: Secondary | ICD-10-CM

## 2016-12-18 NOTE — Progress Notes (Signed)
NEUROLOGY FOLLOW UP OFFICE NOTE  Erik [name] [id number]  HISTORY OF PRESENT ILLNESS: Erik [name] is a 61 year old man with history of HTN, hyperlipidemia, adenocarcinoma of the colon,  PTSD, alcohol abuse, depression and history of SDH who presents for follow-up regarding tension type headache.      UPDATE: He takes amitriptyline 25mg  at bedtime.  Headaches are controlled.  It is moderate and occurs once every 2 weeks.  They respond within an hour to Advil.  He has finished chemotherapy.  Neuropathy is improved in the hands but still severe in the feet.  HISTORY: In 2014, he was on the porch with a friend.  He stood up and felt lightheaded, and then passed out.  When he woke up, the paramedics were there.  He did not have any convulsions, tongue laceration or incontinence.  He elected not to go to the hospital.  Later that night, he began to feel shaky with cold sweat.  He presented a couple of days later to an outside ED, where CT of brain revealed small amount of subarachnoid blood at the high left parietal lobe, as well as small 3 mm adjacent subdural hematoma without mass effect.  There was also a non-displaced left parietal skull fracture.  He was subsequently transferred to [facility].  Serum etoh level was 217.  He was evaluated by neurosurgery.  Follow up CT scan was stable, so no surgical intervention was indicated.  EEG from 08/27/12 was normal.  2D Echo revealed EF 55-60% with no significant abnormalities.  Black outs were thought to be secondary to postural hypotension, rather than seizure.     He has periodic dizzy spells of lightheadedness and feeling of hot flashes or chills.  Sometimes they are associated with passing out for a few seconds.  He has no convulsions, tongue biting, urinary incontinence or postictal confusion.  It usually occurs soon after getting up.  He has fluctuation in blood pressure.  At one point, he was orthostatic but he sometimes has elevated blood  pressure.  He had a cardiac monitor which was unremarkable.    He has unsteady gait and frequent falls.  He underwent chemotherapy, 12 rounds of FOLFOX from 03/30/15 to 09/06/15.  During that time, he developed increased numbness in the hands and feet.  He was started on gabapentin for discomfort and neuropathic pain.  He has a cane but still frequently falls.  His last fall was yesterday.  In January, his B12 level was in the 140s, so he was started on B12 injections.   He has history of alcohol abuse but only moderately drinks from time to time.   PAST MEDICAL HISTORY: Past Medical History:  Diagnosis Date  . Abnormal stress echocardiogram   . Adenocarcinoma of transverse colon ([place]) 02/10/2015  . Alcohol abuse    Heavy Use up until 2010  . Anxiety   . Blood transfusion without reported diagnosis   . COPD (chronic obstructive pulmonary disease) ([place])   . Depression   . Emphysema of lung ([place])   . GERD (gastroesophageal reflux disease)   . Head trauma 2001   closed head injury; coma for 4 weeks  . Hypercholesterolemia   . Hypertension   . ING HERN W/GANGREN RECUR UNILAT/UNSPEC ING HERN 04/21/2009   Qualifier: Diagnosis of  By: Verl Blalock, MD, Delanna Ahmadi PTSD (post-traumatic stress disorder)   . Pulmonary nodule 06/14/2016  . SAH (subarachnoid hemorrhage) ([place]) 08/31/2012  . SDH (subdural hematoma) ([facility])  08/31/2012    MEDICATIONS: Current Outpatient Medications on File Prior to Visit  Medication Sig Dispense Refill  . amitriptyline (ELAVIL) 25 MG tablet TAKE ONE TABLET BY MOUTH EVERY NIGHT AT BEDTIME 30 tablet 0  . atorvastatin (LIPITOR) 20 MG tablet Take 1 tablet (20 mg total) by mouth at bedtime. 30 tablet 0  . buPROPion (WELLBUTRIN SR) 150 MG 12 hr tablet Take 150 mg by mouth 2 (two) times daily.    . cyanocobalamin (,VITAMIN B-12,) 1000 MCG/ML injection Inject 1,000 mcg into the muscle every 30 (thirty) days.    Marland Kitchen dexlansoprazole (DEXILANT) 60 MG capsule Take 1 capsule (60 mg  total) by mouth daily. 30 capsule 11  . FLUoxetine (PROZAC) 20 MG capsule TAKE 1 CAPSULE BY MOUTH 2 TIMES A DAY 60 capsule 0  . gabapentin (NEURONTIN) 300 MG capsule TAKE 4 CAPSULES BY MOUTH EVERY DAY AT BEDTIME 120 capsule 2  . HYDROcodone-acetaminophen (NORCO) 5-325 MG tablet Take 1 tablet by mouth every 6 (six) hours as needed for moderate pain. 60 tablet 0  . loperamide (IMODIUM) 2 MG capsule TAKE 1 TO 2 CAPSULES BY MOUTH EVERY 6 HOURS AS NEEDED FOR DIARRHEA OR LOOSE STOOLS. (Patient taking differently: Take 4 mg by mouth daily. TAKE 1 TO 2 CAPSULES BY MOUTH EVERY 6 HOURS AS NEEDED FOR DIARRHEA OR LOOSE STOOLS.) 60 capsule 3  . PROAIR HFA 108 (90 Base) MCG/ACT inhaler INHALE 2 PUFFS BY MOUTH EVERY 6 HOURS AS NEEDED FOR SHORTNESS OF BREATH/WHEEZING 8.5 each 5   Current Facility-Administered Medications on File Prior to Visit  Medication Dose Route Frequency Provider Last Rate Last Dose  . sodium chloride flush (NS) 0.9 % injection 10 mL  10 mL Intracatheter PRN Penland, Kelby Fam, MD   10 mL at 08/23/15 1030    ALLERGIES: No Known Allergies  FAMILY HISTORY: Family History  Problem Relation Age of Onset  . Pulmonary embolism Mother   . Breast cancer Mother   . Cancer Mother        breast cancer  . Arthritis Mother   . Heart disease Father   . Cancer Father 49       Leukemia  . Cancer Paternal Grandfather        Lung  . Ataxia Neg Hx   . Chorea Neg Hx   . Dementia Neg Hx   . Mental retardation Neg Hx   . Migraines Neg Hx   . Multiple sclerosis Neg Hx   . Neurofibromatosis Neg Hx   . Neuropathy Neg Hx   . Parkinsonism Neg Hx   . Seizures Neg Hx   . Stroke Neg Hx   . Colon cancer Neg Hx     SOCIAL HISTORY: Social History   Socioeconomic History  . Marital status: Divorced    Spouse name: Not on file  . Number of children: 2  . Years of education: 27  . Highest education level: Not on file  Social Needs  . Financial resource strain: Not on file  . Food insecurity -  worry: Not on file  . Food insecurity - inability: Not on file  . Transportation needs - medical: Not on file  . Transportation needs - non-medical: Not on file  Occupational History  . Occupation: disabled/unemployed  Tobacco Use  . Smoking status: Current Some Day Smoker    Packs/day: 0.10    Years: 30.00    Pack years: 3.00    Types: Cigarettes    Start date: 02/06/1973  . Smokeless  tobacco: Never Used  . Tobacco comment: smokes every 2-3 days, 2-3 a day when smokes  Substance and Sexual Activity  . Alcohol use: Yes    Alcohol/week: 0.0 oz    Comment: beer occ, history of ETOH abuse in remote past.   . Drug use: No    Comment: marijuana last 2012  . Sexual activity: No    Partners: Female    Birth control/protection: None  Other Topics Concern  . Not on file  Social History Narrative   Single   Lives alone   Turned down for disability    REVIEW OF SYSTEMS: Constitutional: No fevers, chills, or sweats, no generalized fatigue, change in appetite Eyes: No visual changes, double vision, eye pain Ear, nose and throat: No hearing loss, ear pain, nasal congestion, sore throat Cardiovascular: No chest pain, palpitations Respiratory:  No shortness of breath at rest or with exertion, wheezes GastrointestinaI: No nausea, vomiting, diarrhea, abdominal pain, fecal incontinence Genitourinary:  No dysuria, urinary retention or frequency Musculoskeletal:  No neck pain, back pain Integumentary: No rash, pruritus, skin lesions Neurological: as above Psychiatric: No depression, insomnia, anxiety Endocrine: No palpitations, fatigue, diaphoresis, mood swings, change in appetite, change in weight, increased thirst Hematologic/Lymphatic:  No purpura, petechiae. Allergic/Immunologic: no itchy/runny eyes, nasal congestion, recent allergic reactions, rashes  PHYSICAL EXAM: Vitals:   12/18/16 1404  BP: 138/72  Pulse: 83  SpO2: 95%   General: No acute distress.  Patient appears  well-groomed.   Head:  Normocephalic/atraumatic Eyes:  Fundi examined but not visualized Neck: supple, no paraspinal tenderness, full range of motion Heart:  Regular rate and rhythm Lungs:  Clear to auscultation bilaterally Back: No paraspinal tenderness Neurological Exam: alert and oriented to person, place, and time. Attention span and concentration intact, recent and remote memory intact, fund of knowledge intact.  Speech fluent and not dysarthric, language intact.  CN II-XII intact. Bulk and tone normal, muscle strength 5/5 throughout.  Sensation to temperature and vibration reduced in the feet.  Deep tendon reflexes absent throughout, toes downgoing.  Finger to nose  testing intact.  Ataxic.  IMPRESSION: Tension-type headache improved Chemotherapy-induced peripheral neuropathy with frequent falls.  Tobacco use  PLAN: 1.  Amitriptyline 25mg  at bedtime 2.  Follow up in one year 3.  Smoking cessation  15 minutes spent face to face with patient, over 50% spent discussing management.  Metta Clines, DO  CC: Blanchie Serve, MD

## 2016-12-18 NOTE — Patient Instructions (Signed)
Continue amitriptyline 25mg  at bedtime Follow up in one year

## 2016-12-19 ENCOUNTER — Encounter (HOSPITAL_COMMUNITY): Payer: Self-pay

## 2016-12-19 ENCOUNTER — Encounter (HOSPITAL_COMMUNITY): Payer: Medicaid Other | Attending: Hematology & Oncology

## 2016-12-19 VITALS — BP 129/77 | HR 95 | Temp 98.5°F | Resp 20

## 2016-12-19 DIAGNOSIS — E538 Deficiency of other specified B group vitamins: Secondary | ICD-10-CM

## 2016-12-19 DIAGNOSIS — C184 Malignant neoplasm of transverse colon: Secondary | ICD-10-CM | POA: Insufficient documentation

## 2016-12-19 MED ORDER — CYANOCOBALAMIN 1000 MCG/ML IJ SOLN
1000.0000 ug | Freq: Once | INTRAMUSCULAR | Status: AC
  Administered 2016-12-19: 1000 ug via INTRAMUSCULAR
  Filled 2016-12-19: qty 1

## 2016-12-19 NOTE — Patient Instructions (Signed)
[facility] at [facility]  Discharge Instructions:  You received a b12 shot today.  _______________________________________________________________  Thank you for choosing [facility] at [facility] to provide your oncology and hematology care.  To afford each patient quality time with our providers, please arrive at least 15 minutes before your scheduled appointment.  You need to re-schedule your appointment if you arrive 10 or more minutes late.  We strive to give you quality time with our providers, and arriving late affects you and other patients whose appointments are after yours.  Also, if you no show three or more times for appointments you may be dismissed from the clinic.  Again, thank you for choosing [facility]. Our hope is that these requests will allow you access to exceptional care and in a timely manner. _______________________________________________________________  If you have questions after your visit, please contact our office at [phone] between the hours of 8:30 a.m. and 5:00 p.m. Voicemails left after 4:30 p.m. will not be returned until the following business day. _______________________________________________________________  For prescription refill requests, have your pharmacy contact our office. _______________________________________________________________  Recommendations made by the consultant and any test results will be sent to your referring physician. _______________________________________________________________ 

## 2016-12-19 NOTE — Progress Notes (Signed)
Patient tolerated b12 shot with no complaints voiced.  Injection site clean and dry with no bruising or swelling noted at site.  Band aid applied.  VSS with discharge and left ambulatory.  No s/s of distress noted.

## 2016-12-21 ENCOUNTER — Telehealth: Payer: Self-pay | Admitting: *Deleted

## 2016-12-21 MED ORDER — DEXLANSOPRAZOLE 60 MG PO CPDR: 1.0000 | DELAYED_RELEASE_CAPSULE | Freq: Every day | ORAL | 11 refills | Status: DC

## 2016-12-21 NOTE — Telephone Encounter (Signed)
Seen 10 22 18   Pt just requesting a refill.

## 2016-12-21 NOTE — Telephone Encounter (Signed)
Patient called with questions about dexilant 60mg . Please advise [phone]

## 2017-01-02 ENCOUNTER — Other Ambulatory Visit: Payer: Self-pay | Admitting: Hematology & Oncology

## 2017-01-02 ENCOUNTER — Telehealth: Payer: Self-pay | Admitting: *Deleted

## 2017-01-02 MED ORDER — ATORVASTATIN CALCIUM 20 MG PO TABS: 20.0000 mg | ORAL_TABLET | Freq: Every day | ORAL | 3 refills | Status: DC

## 2017-01-02 NOTE — Telephone Encounter (Signed)
Seen 10 22 18 

## 2017-01-02 NOTE — Telephone Encounter (Signed)
Patient called stating he is having a problem getting his prescriptions through physician pharmacy, per patient Erik [name] from [facility] used to fill his refills and the pharmacy told him Dr Meda Coffee will now have to do his refills. Please advise [phone]  Patient states right now he is out of lipitor and he really needs that filled.

## 2017-01-02 NOTE — Telephone Encounter (Signed)
Done

## 2017-01-04 ENCOUNTER — Other Ambulatory Visit: Payer: Self-pay

## 2017-01-04 NOTE — Telephone Encounter (Signed)
Can we change him to omeprazole? Insurance will not cover dexilant

## 2017-01-05 ENCOUNTER — Other Ambulatory Visit (HOSPITAL_COMMUNITY): Payer: Self-pay | Admitting: Adult Health

## 2017-01-05 DIAGNOSIS — C184 Malignant neoplasm of transverse colon: Secondary | ICD-10-CM

## 2017-01-09 ENCOUNTER — Encounter (HOSPITAL_COMMUNITY): Payer: Self-pay | Admitting: Adult Health

## 2017-01-09 MED ORDER — OMEPRAZOLE 20 MG PO CPDR: 20.0000 mg | DELAYED_RELEASE_CAPSULE | Freq: Every day | ORAL | 1 refills | Status: DC

## 2017-01-09 NOTE — Progress Notes (Signed)
Received call from Dr. Jeanmarie Plant with radiology.    She performed patient's mammogram and exam today. Shared with me that patient's rash to her chest wall has gotten worse in the past 6 months per patient, and Dr. Jeanmarie Plant recommends additional skin punch biopsy (patient has had skin punch biopsy in the past which showed fat necrosis). Per Dr. Jeanmarie Plant, patient with raised papule lesions along the scar.  Her mammogram imaging itself is stable per radiology.    Urgent referral sent to [facility] for Dr. Lucia Gaskins, who performed patient's breast surgery and skin punch biopsy in the past. Message sent to our schedulers to help facilitate getting this appointment scheduled as soon as possible.  Message sent to nursing to have them contact patient with the above plan.     Mike Craze, NP [place] [phone]

## 2017-01-09 NOTE — Addendum Note (Signed)
Addended by: Ova Freshwater on: 01/09/2017 08:39 AM   Modules accepted: Orders

## 2017-01-09 NOTE — Telephone Encounter (Signed)
Yes.  Pend for me

## 2017-01-15 ENCOUNTER — Ambulatory Visit (HOSPITAL_COMMUNITY): Payer: Medicaid Other

## 2017-01-18 ENCOUNTER — Other Ambulatory Visit: Payer: Self-pay

## 2017-01-18 ENCOUNTER — Encounter (HOSPITAL_COMMUNITY): Payer: Self-pay

## 2017-01-18 ENCOUNTER — Encounter (HOSPITAL_COMMUNITY): Payer: Medicaid Other | Attending: Oncology

## 2017-01-18 VITALS — BP 145/76 | HR 90 | Temp 98.2°F | Resp 20

## 2017-01-18 DIAGNOSIS — E538 Deficiency of other specified B group vitamins: Secondary | ICD-10-CM

## 2017-01-18 DIAGNOSIS — C184 Malignant neoplasm of transverse colon: Secondary | ICD-10-CM | POA: Insufficient documentation

## 2017-01-18 MED ORDER — CYANOCOBALAMIN 1000 MCG/ML IJ SOLN
INTRAMUSCULAR | Status: AC
  Filled 2017-01-18: qty 1

## 2017-01-18 MED ORDER — SODIUM CHLORIDE 0.9% FLUSH: 10.0000 mL | INTRAVENOUS | Status: DC | PRN

## 2017-01-18 MED ORDER — HEPARIN SOD (PORK) LOCK FLUSH 100 UNIT/ML IV SOLN: 500.0000 [IU] | Freq: Once | INTRAVENOUS | Status: DC

## 2017-01-18 MED ORDER — CYANOCOBALAMIN 1000 MCG/ML IJ SOLN
1000.0000 ug | Freq: Once | INTRAMUSCULAR | Status: AC
  Administered 2017-01-18: 1000 ug via INTRAMUSCULAR

## 2017-01-18 NOTE — Progress Notes (Signed)
Erik [name] presents today for injection per the provider's orders.  B12 administration without incident; see MAR for injection details.  Patient tolerated procedure well and without incident.  No questions or complaints noted at this time.  Discharged ambulatory.

## 2017-01-26 ENCOUNTER — Encounter (HOSPITAL_BASED_OUTPATIENT_CLINIC_OR_DEPARTMENT_OTHER): Payer: Medicaid Other | Admitting: Oncology

## 2017-01-26 ENCOUNTER — Other Ambulatory Visit: Payer: Self-pay

## 2017-01-26 ENCOUNTER — Encounter (HOSPITAL_BASED_OUTPATIENT_CLINIC_OR_DEPARTMENT_OTHER): Payer: Medicaid Other

## 2017-01-26 ENCOUNTER — Encounter (HOSPITAL_COMMUNITY): Payer: Self-pay

## 2017-01-26 VITALS — BP 119/80 | HR 86 | Temp 98.0°F | Resp 18 | Wt 235.9 lb

## 2017-01-26 DIAGNOSIS — G8929 Other chronic pain: Secondary | ICD-10-CM

## 2017-01-26 DIAGNOSIS — R109 Unspecified abdominal pain: Secondary | ICD-10-CM | POA: Diagnosis not present

## 2017-01-26 DIAGNOSIS — C184 Malignant neoplasm of transverse colon: Secondary | ICD-10-CM

## 2017-01-26 DIAGNOSIS — R911 Solitary pulmonary nodule: Secondary | ICD-10-CM | POA: Diagnosis not present

## 2017-01-26 DIAGNOSIS — E538 Deficiency of other specified B group vitamins: Secondary | ICD-10-CM

## 2017-01-26 DIAGNOSIS — R197 Diarrhea, unspecified: Secondary | ICD-10-CM

## 2017-01-26 DIAGNOSIS — G620 Drug-induced polyneuropathy: Secondary | ICD-10-CM

## 2017-01-26 DIAGNOSIS — R112 Nausea with vomiting, unspecified: Secondary | ICD-10-CM

## 2017-01-26 DIAGNOSIS — R42 Dizziness and giddiness: Secondary | ICD-10-CM

## 2017-01-26 LAB — CBC WITH DIFFERENTIAL/PLATELET
Basophils Absolute: 0 10*3/uL (ref 0.0–0.1)
Basophils Relative: 1 %
EOS ABS: 0.2 10*3/uL (ref 0.0–0.7)
EOS PCT: 2 %
HCT: 43 % (ref 39.0–52.0)
Hemoglobin: 14 g/dL (ref 13.0–17.0)
LYMPHS ABS: 1.5 10*3/uL (ref 0.7–4.0)
Lymphocytes Relative: 22 %
MCH: 30.9 pg (ref 26.0–34.0)
MCHC: 32.6 g/dL (ref 30.0–36.0)
MCV: 94.9 fL (ref 78.0–100.0)
MONO ABS: 0.5 10*3/uL (ref 0.1–1.0)
MONOS PCT: 7 %
Neutro Abs: 4.5 10*3/uL (ref 1.7–7.7)
Neutrophils Relative %: 68 %
PLATELETS: 212 10*3/uL (ref 150–400)
RBC: 4.53 MIL/uL (ref 4.22–5.81)
RDW: 13.6 % (ref 11.5–15.5)
WBC: 6.7 10*3/uL (ref 4.0–10.5)

## 2017-01-26 LAB — COMPREHENSIVE METABOLIC PANEL
ALT: 41 U/L (ref 17–63)
ANION GAP: 10 (ref 5–15)
AST: 34 U/L (ref 15–41)
Albumin: 3.9 g/dL (ref 3.5–5.0)
Alkaline Phosphatase: 87 U/L (ref 38–126)
BUN: 15 mg/dL (ref 6–20)
CHLORIDE: 103 mmol/L (ref 101–111)
CO2: 25 mmol/L (ref 22–32)
Calcium: 9.2 mg/dL (ref 8.9–10.3)
Creatinine, Ser: 1.06 mg/dL (ref 0.61–1.24)
GFR calc non Af Amer: >60 mL/min (ref >60)
Glucose, Bld: 102 mg/dL — ABNORMAL HIGH (ref 65–99)
POTASSIUM: 4.7 mmol/L (ref 3.5–5.1)
SODIUM: 138 mmol/L (ref 135–145)
Total Bilirubin: 0.8 mg/dL (ref 0.3–1.2)
Total Protein: 7.4 g/dL (ref 6.5–8.1)

## 2017-01-26 LAB — VITAMIN B12: VITAMIN B 12: 387 pg/mL (ref 180–914)

## 2017-01-26 LAB — IRON AND TIBC
IRON: 118 ug/dL (ref 45–182)
SATURATION RATIOS: 35 % (ref 17.9–39.5)
TIBC: 337 ug/dL (ref 250–450)
UIBC: 219 ug/dL

## 2017-01-26 MED ORDER — SODIUM CHLORIDE 0.9% FLUSH
10.0000 mL | Freq: Once | INTRAVENOUS | Status: AC
  Administered 2017-01-26: 10 mL via INTRAVENOUS

## 2017-01-26 MED ORDER — HEPARIN SOD (PORK) LOCK FLUSH 100 UNIT/ML IV SOLN
500.0000 [IU] | Freq: Once | INTRAVENOUS | Status: AC
  Administered 2017-01-26: 500 [IU] via INTRAVENOUS

## 2017-01-26 MED ORDER — HEPARIN SOD (PORK) LOCK FLUSH 100 UNIT/ML IV SOLN
INTRAVENOUS | Status: AC
  Filled 2017-01-26: qty 5

## 2017-01-26 NOTE — Patient Instructions (Signed)
[facility] at [facility] Discharge Instructions  RECOMMENDATIONS MADE BY THE CONSULTANT AND ANY TEST RESULTS WILL BE SENT TO YOUR REFERRING PHYSICIAN.  You were seen today by Dr. Louise Zhou    Thank you for choosing [facility] at [facility] to provide your oncology and hematology care.  To afford each patient quality time with our provider, please arrive at least 15 minutes before your scheduled appointment time.    If you have a lab appointment with the Cancer Center please come in thru the  Main Entrance and check in at the main information desk  You need to re-schedule your appointment should you arrive 10 or more minutes late.  We strive to give you quality time with our providers, and arriving late affects you and other patients whose appointments are after yours.  Also, if you no show three or more times for appointments you may be dismissed from the clinic at the providers discretion.     Again, thank you for choosing [facility].  Our hope is that these requests will decrease the amount of time that you wait before being seen by our physicians.       _____________________________________________________________  Should you have questions after your visit to [facility], please contact our office at [phone] between the hours of 8:30 a.m. and 4:30 p.m.  Voicemails left after 4:30 p.m. will not be returned until the following business day.  For prescription refill requests, have your pharmacy contact our office.       Resources For Cancer Patients and their Caregivers ? American Cancer Society: Can assist with transportation, wigs, general needs, runs Look Good Feel Better.        1-888-227-6333 ? Cancer Care: Provides financial assistance, online support groups, medication/co-pay assistance.  1-800-813-HOPE (4673) ? [facility] Assists [place] cancer patients and their  families through emotional , educational and financial support.  336-427-4357 ? [place] DSS Where to apply for food stamps, Medicaid and utility assistance. 336-342-1394 ? [facility]: Transportation to medical appointments. 336-347-2287 ? Social Security Administration: May apply for disability if have a Stage IV cancer. 336-342-7796 1-800-772-1213 ? [place] Aging, Disability and Transit Services: Assists with nutrition, care and transit needs. 336-349-2343  Cancer Center Support Programs: @10RELATIVEDAYS@ > Cancer Support Group  2nd Tuesday of the month 1pm-2pm, Journey Room  > Creative Journey  3rd Tuesday of the month 1130am-1pm, Journey Room  > Look Good Feel Better  1st Wednesday of the month 10am-12 noon, Journey Room (Call American Cancer Society to register 1-800-395-5775)    

## 2017-01-26 NOTE — Progress Notes (Signed)
[place], [place] [address]   CLINIC:  Medical Oncology/Hematology  PCP:  Raylene Everts, MD [phone] [address] [place] [address] [phone]   REASON FOR VISIT:  Follow-up for Stage IIIC adenocarcinoma of transverse colon AND vitamin B12 deficiency AND LUL pulmonary nodule   CURRENT THERAPY: Surveillance per NCCN Guidelines AND monthly B12 injections AND periodic CT imaging    BRIEF ONCOLOGIC HISTORY:  Oncology History   Stage IIIC ([id number]) adenocarcinoma of transverse colon, diagnosed on colonoscopy by Dr. Oneida Alar on 01/12/2015 followed by definitive surgery by Dr. [name] with right hemicolectomy on 02/12/2015.  He then underwent FOLFOX x 12 cycles in the adjuvant setting (03/30/2015- 09/06/2015).     Adenocarcinoma of transverse colon ([place])   01/12/2015 Pathologic Stage    Colon, biopsy, distal transverse - TUBULOVILLOUS ADENOMA WITH HIGH GRADE DYSPLASIA.      01/12/2015 Procedure    Colonoscopy by Dr. Oneida Alar.      01/12/2015 Tumor Marker    CEA: 6.6 (H)       01/18/2015 Imaging    CT abd/pelvis- Apple-core lesion identified in the mid transverse colon without obstruction. No evidence for lymphadenopathy in the gastrohepatic ligament or omentum.  Stable 8 mm hypo attenuating lesion in the left liver, likely a cyst.      02/10/2015 Initial Diagnosis    Adenocarcinoma of transverse colon ([place])      02/12/2015 Definitive Surgery    [name], Extended right hemicolectomy       02/12/2015 Pathology Results    Mucinous adenocarcinoma with penetration of visceral peritoneum, 4/19 lymph nodes for metastatic disease, negative resection margins, with LVI and perineural invasion      03/30/2015 - 09/06/2015 Chemotherapy    FOLFOX x 12 cycles      05/25/2015 Treatment Plan Change    5 FU bolus discontinued for cycle #5      06/08/2015 Treatment Plan Change    Treatment deferred x 1 week      06/15/2015 Treatment  Plan Change    5FU CI decreased by 10% and Oxaliplatin reduced by 15% for cycles #6-#11; Oxaliplatin dropped for cycle #12 d/t neuropathy.       10/20/2015 Imaging    CT CAP- Right hemicolectomy without evidence of metastatic disease. 2. Previously measured ground-glass lesion in the left upper lobe has resolved. 3. Probable food debris in the stomach, simulating gastric wall thickening. Please correlate clinically. 4. 6 mm irregular nodular density in the left upper lobe, stable. Continued attention on followup exams is warranted.      01/25/2016 Procedure    Colonoscopy by Dr. Oneida Alar- Non-thrombosed external hemorrhoids found on digital rectal exam. - One 4 mm polyp in the rectum, removed with a cold biopsy forceps. Resected and retrieved. - Congested mucosa in the neo-terminal ileum. Biopsied. - Redundant colon. - Internal hemorrhoids.      01/26/2016 Pathology Results    1. Terminal ileum, biopsy - MILD ACUTE (ACTIVE) ILEITIS. - NO DYSPLASIA OR MALIGNANCY IDENTIFIED - SEE COMMENT. 2. Rectum, polyp(s) - HYPERPLASTIC POLYP (X 1). - NO DYSPLASIA OR MALIGNANCY IDENTIFIED.      04/13/2016 Imaging    CT chest- Stable CT chest. 6 mm irregular nodule anterior left upper lobe is stable. The scattered areas of peribronchovascular micro nodularity in the lungs bilaterally are unchanged.      10/19/2016 Imaging    CT CAP: 1. Status post right hemicolectomy. No findings to suggest metastatic  disease in the abdomen or pelvis. 2. Aortic atherosclerosis. 3. Additional incidental findings, as above. Aortic Atherosclerosis (ICD10-I70.0).        HISTORY OF PRESENT ILLNESS:  (From [name], PA-C's last note on 06/14/16)      INTERVAL HISTORY:  Mr. Carlile 61 y.o. male returns for follow-up for colon cancer, B12 deficiency, and lung nodule.   Patient presents today for continued follow-up of his colon cancer.  He states that overall he has been doing well.  He denies any chest  pain, shortness of breath, abdominal pain, focal weakness or recent infections. He has intermittent diarrhea and nausea and vomiting.  He states he has intermittent dizziness especially when he is standing up.  He has chronic neuropathy in his hands and feet.  He states he feels fatigued.  Also has chronic abdominal pains which he rates at 4 out of 10 in severity.  Appetite is at 75%.   REVIEW OF SYSTEMS:  Review of Systems  Constitutional: Positive for fatigue.  HENT:  Negative.   Eyes: Negative.   Respiratory: Negative.  Negative for cough and shortness of breath.   Cardiovascular: Negative.   Gastrointestinal: Positive for abdominal pain, diarrhea, nausea and vomiting. Negative for blood in stool and constipation.  Endocrine: Negative.   Genitourinary: Negative.    Neurological: Positive for dizziness and numbness. Negative for headaches.  Hematological: Negative.   Psychiatric/Behavioral: Negative.      PAST MEDICAL/SURGICAL HISTORY:  Past Medical History:  Diagnosis Date  . Abnormal stress echocardiogram   . Adenocarcinoma of transverse colon ([place]) 02/10/2015  . Alcohol abuse    Heavy Use up until 2010  . Anxiety   . Blood transfusion without reported diagnosis   . COPD (chronic obstructive pulmonary disease) ([place])   . Depression   . Emphysema of lung ([place])   . GERD (gastroesophageal reflux disease)   . Head trauma 2001   closed head injury; coma for 4 weeks  . Hypercholesterolemia   . Hypertension   . ING HERN W/GANGREN RECUR UNILAT/UNSPEC ING HERN 04/21/2009   Qualifier: Diagnosis of  By: Verl Blalock, MD, Delanna Ahmadi PTSD (post-traumatic stress disorder)   . Pulmonary nodule 06/14/2016  . SAH (subarachnoid hemorrhage) ([place]) 08/31/2012  . SDH (subdural hematoma) ([place]) 08/31/2012   Past Surgical History:  Procedure Laterality Date  . BIOPSY  01/25/2016   Procedure: BIOPSY;  Surgeon: Danie Binder, MD;  [facility];  Service: Endoscopy;;  ileum;   . cardiac  cath    . COLONOSCOPY  2011   Dr. Oneida Alar: multiple adenomas and hyperplastic polyps  . COLONOSCOPY WITH PROPOFOL N/A 01/12/2015   Procedure: COLONOSCOPY WITH PROPOFOL;  Surgeon: Danie Binder, MD;  [facility];  Service: Endoscopy;  Laterality: N/A;  1030  . COLONOSCOPY WITH PROPOFOL N/A 01/25/2016   Procedure: COLONOSCOPY WITH PROPOFOL;  Surgeon: Danie Binder, MD;  [facility];  Service: Endoscopy;  Laterality: N/A;  1230  . CRANIOTOMY  2001  . ESOPHAGOGASTRODUODENOSCOPY (EGD) WITH PROPOFOL N/A 01/12/2015   Procedure: ESOPHAGOGASTRODUODENOSCOPY (EGD) WITH PROPOFOL;  Surgeon: Danie Binder, MD;  [facility];  Service: Endoscopy;  Laterality: N/A;  . head injury surgery    . HERNIA REPAIR Right 2012   Inguinal- [place] Na  . KIDNEY SURGERY     >30 years ago  . LAPAROSCOPIC RIGHT HEMI COLECTOMY Left 02/10/2015   Procedure: LAPAROSCOPIC THEN OPEN RIGHT HEMI COLECTOMY;  Surgeon: [name], MD;  Location: [facility];  Service: General;  Laterality: Left;  . POLYPECTOMY  01/25/2016   Procedure: POLYPECTOMY;  Surgeon: Danie Binder, MD;  [facility];  Service: Endoscopy;;  colon  . PORTACATH PLACEMENT N/A 03/24/2015   Procedure: INSERTION PORT-A-CATH;  Surgeon: Jules Husbands, MD;  Location: [facility];  Service: General;  Laterality: N/A;     SOCIAL HISTORY:  Social History   Socioeconomic History  . Marital status: Divorced    Spouse name: Not on file  . Number of children: 2  . Years of education: 53  . Highest education level: Not on file  Social Needs  . Financial resource strain: Not on file  . Food insecurity - worry: Not on file  . Food insecurity - inability: Not on file  . Transportation needs - medical: Not on file  . Transportation needs - non-medical: Not on file  Occupational History  . Occupation: disabled/unemployed  Tobacco Use  . Smoking status: Current Some Day Smoker    Packs/day: 0.10    Years: 30.00    Pack  years: 3.00    Types: Cigarettes    Start date: 02/06/1973  . Smokeless tobacco: Never Used  . Tobacco comment: smokes every 2-3 days, 2-3 a day when smokes  Substance and Sexual Activity  . Alcohol use: Yes    Alcohol/week: 0.0 oz    Comment: beer occ, history of ETOH abuse in remote past.   . Drug use: No    Comment: marijuana last 2012  . Sexual activity: No    Partners: Female    Birth control/protection: None  Other Topics Concern  . Not on file  Social History Narrative   Single   Lives alone   Turned down for disability    FAMILY HISTORY:  Family History  Problem Relation Age of Onset  . Pulmonary embolism Mother   . Breast cancer Mother   . Cancer Mother        breast cancer  . Arthritis Mother   . Heart disease Father   . Cancer Father 30       Leukemia  . Cancer Paternal Grandfather        Lung  . Ataxia Neg Hx   . Chorea Neg Hx   . Dementia Neg Hx   . Mental retardation Neg Hx   . Migraines Neg Hx   . Multiple sclerosis Neg Hx   . Neurofibromatosis Neg Hx   . Neuropathy Neg Hx   . Parkinsonism Neg Hx   . Seizures Neg Hx   . Stroke Neg Hx   . Colon cancer Neg Hx     CURRENT MEDICATIONS:  Outpatient Encounter Medications as of 01/26/2017  Medication Sig  . amitriptyline (ELAVIL) 25 MG tablet TAKE ONE TABLET BY MOUTH EVERY NIGHT AT BEDTIME  . atorvastatin (LIPITOR) 20 MG tablet Take 1 tablet (20 mg total) by mouth at bedtime.  Marland Kitchen buPROPion (WELLBUTRIN SR) 150 MG 12 hr tablet Take 150 mg by mouth 2 (two) times daily.  . cyanocobalamin (,VITAMIN B-12,) 1000 MCG/ML injection Inject 1,000 mcg into the muscle every 30 (thirty) days.  Marland Kitchen FLUoxetine (PROZAC) 20 MG capsule TAKE 1 CAPSULE BY MOUTH 2 TIMES A DAY  . gabapentin (NEURONTIN) 300 MG capsule TAKE 4 CAPSULES BY MOUTH EVERY DAY AT BEDTIME  . HYDROcodone-acetaminophen (NORCO) 5-325 MG tablet Take 1 tablet by mouth every 6 (six) hours as needed for moderate pain.  Marland Kitchen loperamide (IMODIUM) 2 MG capsule TAKE 1  TO 2 CAPSULES BY MOUTH EVERY 6 HOURS AS NEEDED FOR DIARRHEA OR LOOSE STOOLS. (Patient taking differently: Take 4 mg by mouth daily. TAKE 1 TO 2 CAPSULES BY MOUTH EVERY 6 HOURS AS NEEDED FOR DIARRHEA OR LOOSE STOOLS.)  . loperamide (IMODIUM) 2 MG capsule TAKE 1 TO 2 CAPSULES BY MOUTH EVERY 6 HOURS AS NEEDED FOR DIARRHEA OR LOOSE STOOLS  . omeprazole (PRILOSEC) 20 MG capsule Take 1 capsule (20 mg total) by mouth daily.  Marland Kitchen PROAIR HFA 108 (90 Base) MCG/ACT inhaler INHALE 2 PUFFS BY MOUTH EVERY 6 HOURS AS NEEDED FOR SHORTNESS OF BREATH/WHEEZING   Facility-Administered Encounter Medications as of 01/26/2017  Medication  . sodium chloride flush (NS) 0.9 % injection 10 mL  . heparin lock flush 100 UNIT/ML injection    ALLERGIES:  No Known Allergies   PHYSICAL EXAM:  ECOG Performance status: 1 - Symptomatic; remains largely independent   Vitals:   01/26/17 0929  BP: 119/80  Pulse: 86  Resp: 18  Temp: 98 F (36.7 C)  SpO2: 96%     Physical Exam  Constitutional: He is oriented to person, place, and time and well-developed, well-nourished, and in no distress.  HENT:  Head: Normocephalic.  Mouth/Throat: Oropharynx is clear and moist. No oropharyngeal exudate.  Eyes: Conjunctivae are normal. Pupils are equal, round, and reactive to light. No scleral icterus.  Neck: Normal range of motion. Neck supple.  Cardiovascular: Normal rate and regular rhythm.  Pulmonary/Chest: Effort normal and breath sounds normal. No respiratory distress.  Abdominal: Soft. Bowel sounds are normal. There is tenderness (tender to palpation at surgical scar). There is no rebound.  Musculoskeletal: Normal range of motion. He exhibits edema (Trace BLE edema).  Lymphadenopathy:    He has no cervical adenopathy.  Neurological: He is alert and oriented to person, place, and time.  Skin: Skin is warm and dry. No rash noted.  Psychiatric: Mood, memory, affect and judgment normal.  Nursing note and vitals  reviewed.    LABORATORY DATA:  I have reviewed the labs as listed.  CBC    Component Value Date/Time   WBC 6.7 01/26/2017 0906   RBC 4.53 01/26/2017 0906   HGB 14.0 01/26/2017 0906   HCT 43.0 01/26/2017 0906   PLT 212 01/26/2017 0906   MCV 94.9 01/26/2017 0906   MCH 30.9 01/26/2017 0906   MCHC 32.6 01/26/2017 0906   RDW 13.6 01/26/2017 0906   LYMPHSABS 1.5 01/26/2017 0906   MONOABS 0.5 01/26/2017 0906   EOSABS 0.2 01/26/2017 0906   BASOSABS 0.0 01/26/2017 0906   CMP Latest Ref Rng & Units 01/26/2017 09/14/2016 06/14/2016  Glucose 65 - 99 mg/dL 102(H) 106(H) 100(H)  BUN 6 - 20 mg/dL '15 9 13  ' Creatinine 0.61 - 1.24 mg/dL 1.06 0.87 0.98  Sodium 135 - 145 mmol/L 138 138 140  Potassium 3.5 - 5.1 mmol/L 4.7 4.1 3.9  Chloride 101 - 111 mmol/L 103 100(L) 100(L)  CO2 22 - 32 mmol/L '25 29 29  ' Calcium 8.9 - 10.3 mg/dL 9.2 9.0 9.4  Total Protein 6.5 - 8.1 g/dL 7.4 7.8 8.0  Total Bilirubin 0.3 - 1.2 mg/dL 0.8 0.6 0.8  Alkaline Phos 38 - 126 U/L 87 75 74  AST 15 - 41 U/L 34 21 21  ALT 17 - 63 U/L 41 12(L) 22    PENDING LABS:    DIAGNOSTIC IMAGING:  *The following radiologic images and reports have been reviewed independently and agree with below findings.  Last restaging CT chest/abd/pelvis:  10/20/15 CLINICAL DATA:  Colon cancer treated with surgery and chemotherapy. Recent bilateral leg numbness for 2 weeks.  EXAM: CT CHEST, ABDOMEN, AND PELVIS WITH CONTRAST  TECHNIQUE: Multidetector CT imaging of the chest, abdomen and pelvis was performed following the standard protocol during bolus administration of intravenous contrast.  CONTRAST:  138m ISOVUE-300 IOPAMIDOL (ISOVUE-300) INJECTION 61%  COMPARISON:  CT chest 03/15/2015 and CT abdomen pelvis 01/18/2015.  FINDINGS: CT CHEST FINDINGS  Cardiovascular: Right IJ Port-A-Cath terminates in the low SVC. Minimal aortic atherosclerosis. Left anterior descending coronary artery calcification. Heart size normal. No  pericardial effusion.  Mediastinum/Nodes: Mediastinal lymph nodes are not enlarged by CT size criteria. No hilar or axillary adenopathy. Esophagus is grossly unremarkable.  Lungs/Pleura: Moderate centrilobular emphysema. Mild scattered peribronchovascular nodularity, unchanged. 6 mm irregular nodular density in the anterior left upper lobe (series 6, image 55), unchanged. Previously measured area of ill-defined ground-glass in the medial left upper lobe is no longer identified. No pleural fluid. Airway is unremarkable.  Musculoskeletal: No worrisome lytic or sclerotic lesions.  CT ABDOMEN PELVIS FINDINGS  Hepatobiliary: Sub cm low-attenuation lesion in the peripheral right hepatic lobe is unchanged. Gallbladder is unremarkable. No biliary ductal dilatation.  Pancreas: Negative.  Spleen: Negative.  Adrenals/Urinary Tract: Adrenal glands and kidneys are unremarkable. Ureters are decompressed. Bladder is grossly unremarkable.  Stomach/Bowel: Probable food debris in the gastric body, simulating wall thickening (series 2, image 52). Stomach and small bowel are otherwise unremarkable. Right hemicolectomy. Colon is otherwise unremarkable.  Vascular/Lymphatic: Atherosclerotic calcification of the arterial vasculature without abdominal aortic aneurysm. No pathologically enlarged lymph nodes. Ventral midline mesenteric lymph nodes measure up to 6 mm (series 2, image 75).  Reproductive: Prostate may be minimally enlarged.  Other: No free fluid.  Mesenteries and peritoneum are unremarkable.  Musculoskeletal: No worrisome lytic or sclerotic lesions. Degenerative changes are seen in the spine.  IMPRESSION: 1. Right hemicolectomy without evidence of metastatic disease. 2. Previously measured ground-glass lesion in the left upper lobe has resolved. 3. Probable food debris in the stomach, simulating gastric wall thickening. Please correlate clinically. 4. 6 mm  irregular nodular density in the left upper lobe, stable. Continued attention on followup exams is warranted. 5. Aortic atherosclerosis and coronary artery calcification.   Electronically Signed   By: MLorin PicketM.D.   On: 10/20/2015 09:53    PATHOLOGY:  Colon resection surgical path: 02/10/15 SURGICAL PATHOLOGY Surgical Pathology  * THIS IS AN ADDENDUM REPORT *  CASE: [id number]  PATIENT: LWynona Neat Surgical Pathology Report  **Addendum **  Reason for Addendum #1: Immunohistochemistry results   SPECIMEN SUBMITTED:  A. Peritoneal biopsy  B. Extended hemicolectomy, right   CLINICAL HISTORY:  Potential metastatic colon cancer; Tubular adenoma of colon   PRE-OPERATIVE DIAGNOSIS:    POST-OPERATIVE DIAGNOSIS:       DIAGNOSIS:  A. PERITONEUM; BIOPSY:  - FIBROADIPOSE TISSUE WITH INFLAMMATION.   B. RIGHT COLON; EXTENDED RIGHT HEMICOLECTOMY:  - MUCINOUS ADENOCARCINOMA WITH PENETRATION OF THE VISCERAL PERITONEUM.  - SEPARATE TUBULAR ADENOMA OF THE CECUM.  - APPENDIX WITHIN NORMAL LIMITS.  - FOUR OF NINETEEN LYMPH NODES INVOLVED BY METASTATIC CARCINOMA (4/19).  - THE MARGINS OF RESECTION ARE NEGATIVE.  - SEE SUMMARY BELOW.   Note:  Ample material is available for MSI and other ancillary testing.    COLON AND RECTUM: Resection, Including Transanal Disk Excision of Rectal  Neoplasms  Specimens InvolvedB: Extended hemicolectomy, right   Colon and Rectum, Resection Cancer Case Summary  SPECIMEN  Specimen: Terminal ileum  Cecum  Appendix  Ascending colon  Procedure:   Right hemicolectomy  Primary Tumor Site: Right (ascending) colon  Additional Sites Involved by Tumor:   None identified  Macroscopic Tumor Perforation:   Cannot be determined  unspecified  TUMOR  Histologic Type:  Mucinous adenocarcinoma  Histologic Grade:  Other (specify)  Defer to optional MSI testing for grade assignment: Mucinous  adenocarcinoma is no longer considered  definitionally high grade. MSI-H  carcinomas are low grade while MSS or MSI-L carcinomas are high grade  EXTENT  Tumor Size:  Greatest dimension (cm)  4.8cm  Microscopic Tumor Extension: Tumor penetrates to the surface of the  visceral peritoneum (serosa)  MARGINS  Proximal Margin:  Uninvolved by invasive carcinoma  - No adenoma or intraepithelial neoplasia / dysplasia identified  Distal Margin: Uninvolved by invasive carcinoma  - No adenoma or intraepithelial neoplasia / dysplasia identified  Circumferential (Radial) or Mesenteric Margin:  Uninvolved by invasive  carcinoma  Deep Margin:  n/a  ACCESSORY FINDINGS  Lymph-Vascular Invasion: Present  Perineural Invasion:   Present  Tumor Deposits:   Not identified  not identified  STAGE (pTNM)  TNM Descriptors:  n/a  Primary Tumor (pT): pT4a: Tumor penetrates the visceral peritoneum  Regional Lymph Nodes (pN)  pN2a: Metastasis in 4 to 6 regional lymph nodes  Number of Lymph Nodes Examined:  Specify  19  Number of Lymph Nodes Involved:  Specify  4  Distant Metastasis (pM): Not applicable          Colonoscopy path: 01/25/16     ASSESSMENT & PLAN:   Stage IIIC adenocarcinoma of transverse colon:  -Diagnosed in 01/2015 on screening colonoscopy.  CEA elevated at time of diagnosis at 6.6. Treated with definitve surgery with right hemicolectomy by Dr. Adonis Huguenin on 02/12/15. Went on to have adjuvant chemo with FOLFOX x 12 cycles; chemo course was complicated by progressive peripheral neuropathy and thus Oxaliplatin was dose-reduced for cycles #6-11, and then d/c'd for cycle #12.  -Serial CEA monitoring has been normal since completing treatment. CEA pending for today.  -Had colonoscopy in 01/2016 with Dr. Oneida Alar. Path revealed 1 hyperplastic polyp.  No evidence of tubular adenoma, so could likely defer next colonoscopy to 01/2019 based on NCCN Guidelines. Of course, if Dr. Oneida Alar felt that earlier colonoscopy was  clinically warranted, then we support her recommendations as well.  Continue follow-up with Dr. Oneida Alar as directed.  -Clinically NED. Repeat surveillance CT CAP in 6 months prior to his next visit. Repeat labs prior to his next visit. -RTC in 6 months for follow up to review labs and CT CAP.      Abdominal/chronic pain:  -Likely secondary to adhesions/prior surgery.  -Continue Norco as directed. He gets his prescriptions filled with financial assistance through [employer].    Vitamin B12 deficiency:  -Continue monthly B12 injections. Check Vitamin B12 level on his next visit.   LUL pulmonary nodule:  -Stable on CT chest imaging in 10/2015 and 04/2016.  -Repeat CT chest in 6 months for follow up.    Dispo:  -RTC in 6 months for follow up to review labs and CT CAP.    All questions were answered to patient's stated satisfaction. Encouraged patient to call with any new concerns or questions before his next visit to the cancer center and we can certain see him sooner, if needed.     Orders placed this encounter:  Orders Placed This Encounter  Procedures  . CT Abdomen Pelvis W Contrast  .  CT Chest W Contrast  . CBC with Differential  . Comprehensive metabolic panel  . CEA  . Vitamin B12   Twana First, MD

## 2017-01-26 NOTE — Progress Notes (Signed)
Port flushed per protocol.  Labs drawn for oncology follow up visit.  Port site clean and dry with no bruising or swelling noted at site.  Band aid applied.  VSS with discharge and left ambulatory.

## 2017-01-26 NOTE — Patient Instructions (Signed)
[place] at [facility]  Discharge Instructions:  Labs were drawn from your port today for your oncology follow up visit.  _______________________________________________________________  Thank you for choosing [place] [facility] to provide your oncology and hematology care.  To afford each patient quality time with our providers, please arrive at least 15 minutes before your scheduled appointment.  You need to re-schedule your appointment if you arrive 10 or more minutes late.  We strive to give you quality time with our providers, and arriving late affects you and other patients whose appointments are after yours.  Also, if you no show three or more times for appointments you may be dismissed from the clinic.  Again, thank you for choosing [place] [facility] [place] hope is that these requests will allow you access to exceptional care and in a timely manner. _______________________________________________________________  If you have questions after your visit, please contact our office at [phone] between the hours of 8:30 a.m. and 5:00 p.m. Voicemails left after 4:30 p.m. will not be returned until the following business day. _______________________________________________________________  For prescription refill requests, have your pharmacy contact our office. _______________________________________________________________  Recommendations made by the consultant and any test results will be sent to your referring physician. _______________________________________________________________

## 2017-01-27 LAB — CEA: CEA: 2.1 ng/mL (ref 0.0–4.7)

## 2017-02-02 ENCOUNTER — Other Ambulatory Visit: Payer: Self-pay | Admitting: Physician Assistant

## 2017-02-02 ENCOUNTER — Other Ambulatory Visit (HOSPITAL_COMMUNITY): Payer: Self-pay | Admitting: Adult Health

## 2017-02-02 DIAGNOSIS — T451X5A Adverse effect of antineoplastic and immunosuppressive drugs, initial encounter: Principal | ICD-10-CM

## 2017-02-02 DIAGNOSIS — G620 Drug-induced polyneuropathy: Secondary | ICD-10-CM

## 2017-02-12 ENCOUNTER — Other Ambulatory Visit: Payer: Self-pay | Admitting: Family Medicine

## 2017-02-13 NOTE — Telephone Encounter (Signed)
Seen 10 22 18 

## 2017-02-22 ENCOUNTER — Ambulatory Visit (INDEPENDENT_AMBULATORY_CARE_PROVIDER_SITE_OTHER): Payer: Medicaid Other | Admitting: General Surgery

## 2017-02-22 ENCOUNTER — Encounter: Payer: Self-pay | Admitting: General Surgery

## 2017-02-22 VITALS — BP 142/73 | HR 91 | Temp 96.4°F | Ht 74.0 in | Wt 245.0 lb

## 2017-02-22 DIAGNOSIS — C184 Malignant neoplasm of transverse colon: Secondary | ICD-10-CM | POA: Diagnosis not present

## 2017-02-22 NOTE — H&P (Signed)
Erik [name]; [id number]; 30-Oct-1955   HPI Patient is a 62 year old white male who was referred to my care by Dr. Talbert Cage for removal of a Port-A-Cath.  He underwent chemotherapy for colon cancer and is now finished with it.  He has no pain. Past Medical History:  Diagnosis Date  . Abnormal stress echocardiogram   . Adenocarcinoma of transverse colon ([place]) 02/10/2015  . Alcohol abuse    Heavy Use up until 2010  . Anxiety   . Blood transfusion without reported diagnosis   . COPD (chronic obstructive pulmonary disease) ([place])   . Depression   . Emphysema of lung ([place])   . GERD (gastroesophageal reflux disease)   . Head trauma 2001   closed head injury; coma for 4 weeks  . Hypercholesterolemia   . Hypertension   . ING HERN W/GANGREN RECUR UNILAT/UNSPEC ING HERN 04/21/2009   Qualifier: Diagnosis of  By: Verl Blalock, MD, Delanna Ahmadi PTSD (post-traumatic stress disorder)   . Pulmonary nodule 06/14/2016  . SAH (subarachnoid hemorrhage) ([facility]) 08/31/2012  . SDH (subdural hematoma) ([place]) 08/31/2012    Past Surgical History:  Procedure Laterality Date  . BIOPSY  01/25/2016   Procedure: BIOPSY;  Surgeon: Danie Binder, MD;  [facility];  Service: Endoscopy;;  ileum;   . cardiac cath    . COLONOSCOPY  2011   Dr. Oneida Alar: multiple adenomas and hyperplastic polyps  . COLONOSCOPY WITH PROPOFOL N/A 01/12/2015   Procedure: COLONOSCOPY WITH PROPOFOL;  Surgeon: Danie Binder, MD;  [facility];  Service: Endoscopy;  Laterality: N/A;  1030  . COLONOSCOPY WITH PROPOFOL N/A 01/25/2016   Procedure: COLONOSCOPY WITH PROPOFOL;  Surgeon: Danie Binder, MD;  [facility];  Service: Endoscopy;  Laterality: N/A;  1230  . CRANIOTOMY  2001  . ESOPHAGOGASTRODUODENOSCOPY (EGD) WITH PROPOFOL N/A 01/12/2015   Procedure: ESOPHAGOGASTRODUODENOSCOPY (EGD) WITH PROPOFOL;  Surgeon: Danie Binder, MD;  [facility];  Service: Endoscopy;  Laterality: N/A;  . head injury surgery     . HERNIA REPAIR Right 2012   Inguinal- Forestine Na  . KIDNEY SURGERY     >30 years ago  . LAPAROSCOPIC RIGHT HEMI COLECTOMY Left 02/10/2015   Procedure: LAPAROSCOPIC THEN OPEN RIGHT HEMI COLECTOMY;  Surgeon: [name], MD;  Location: [facility];  Service: General;  Laterality: Left;  . POLYPECTOMY  01/25/2016   Procedure: POLYPECTOMY;  Surgeon: Danie Binder, MD;  [facility];  Service: Endoscopy;;  colon  . PORTACATH PLACEMENT N/A 03/24/2015   Procedure: INSERTION PORT-A-CATH;  Surgeon: Jules Husbands, MD;  Location: [facility];  Service: General;  Laterality: N/A;    Family History  Problem Relation Age of Onset  . Pulmonary embolism Mother   . Breast cancer Mother   . Cancer Mother        breast cancer  . Arthritis Mother   . Heart disease Father   . Cancer Father 49       Leukemia  . Cancer Paternal Grandfather        Lung  . Ataxia Neg Hx   . Chorea Neg Hx   . Dementia Neg Hx   . Mental retardation Neg Hx   . Migraines Neg Hx   . Multiple sclerosis Neg Hx   . Neurofibromatosis Neg Hx   . Neuropathy Neg Hx   . Parkinsonism Neg Hx   . Seizures Neg Hx   . Stroke Neg Hx   .  Colon cancer Neg Hx     Current Outpatient Medications on File Prior to Visit  Medication Sig Dispense Refill  . amitriptyline (ELAVIL) 25 MG tablet TAKE ONE TABLET BY MOUTH EVERY NIGHT AT BEDTIME 30 tablet 1  . atorvastatin (LIPITOR) 20 MG tablet Take 1 tablet (20 mg total) by mouth at bedtime. 90 tablet 3  . buPROPion (WELLBUTRIN SR) 150 MG 12 hr tablet Take 150 mg by mouth 2 (two) times daily.    . cyanocobalamin (,VITAMIN B-12,) 1000 MCG/ML injection Inject 1,000 mcg into the muscle every 30 (thirty) days.    Marland Kitchen FLUoxetine (PROZAC) 20 MG capsule TAKE 1 CAPSULE BY MOUTH 2 TIMES A DAY 60 capsule 0  . gabapentin (NEURONTIN) 300 MG capsule TAKE 4 CAPSULES BY MOUTH EVERY DAY AT BEDTIME 120 capsule 2  . HYDROcodone-acetaminophen (NORCO) 5-325 MG tablet Take 1 tablet by mouth every 6 (six)  hours as needed for moderate pain. 60 tablet 0  . loperamide (IMODIUM) 2 MG capsule TAKE 1 TO 2 CAPSULES BY MOUTH EVERY 6 HOURS AS NEEDED FOR DIARRHEA OR LOOSE STOOLS. (Patient taking differently: Take 4 mg by mouth daily. TAKE 1 TO 2 CAPSULES BY MOUTH EVERY 6 HOURS AS NEEDED FOR DIARRHEA OR LOOSE STOOLS.) 60 capsule 3  . loperamide (IMODIUM) 2 MG capsule TAKE 1 TO 2 CAPSULES BY MOUTH EVERY 6 HOURS AS NEEDED FOR DIARRHEA OR LOOSE STOOLS 60 capsule 1  . omeprazole (PRILOSEC) 20 MG capsule Take 1 capsule (20 mg total) by mouth daily. 90 capsule 1  . PROAIR HFA 108 (90 Base) MCG/ACT inhaler INHALE 2 PUFFS BY MOUTH EVERY 6 HOURS AS NEEDED FOR SHORTNESS OF BREATH/WHEEZING. 18 each 2   Current Facility-Administered Medications on File Prior to Visit  Medication Dose Route Frequency Provider Last Rate Last Dose  . sodium chloride flush (NS) 0.9 % injection 10 mL  10 mL Intracatheter PRN Penland, Kelby Fam, MD   10 mL at 08/23/15 1030    No Known Allergies  Social History   Substance and Sexual Activity  Alcohol Use Yes  . Alcohol/week: 0.0 oz   Comment: beer occ, history of ETOH abuse in remote past.     Social History   Tobacco Use  Smoking Status Current Some Day Smoker  . Packs/day: 0.10  . Years: 30.00  . Pack years: 3.00  . Types: Cigarettes  . Start date: 02/06/1973  Smokeless Tobacco Never Used  Tobacco Comment   smokes every 2-3 days, 2-3 a day when smokes    Review of Systems  Constitutional: Positive for malaise/fatigue.  HENT: Positive for sinus pain.   Eyes: Positive for blurred vision.  Respiratory: Positive for shortness of breath.   Cardiovascular: Negative.   Gastrointestinal: Positive for abdominal pain.  Genitourinary: Positive for urgency.  Musculoskeletal: Positive for back pain and joint pain.  Skin: Negative.   Neurological: Negative.   Endo/Heme/Allergies: Negative.   Psychiatric/Behavioral: Negative.     Objective   Vitals:   02/22/17 0922  BP: (!)  142/73  Pulse: 91  Temp: (!) 96.4 F (35.8 C)    Physical Exam  Constitutional: He is oriented to person, place, and time and well-developed, well-nourished, and in no distress.  HENT:  Head: Normocephalic and atraumatic.  Cardiovascular: Normal rate, regular rhythm and normal heart sounds. Exam reveals no gallop and no friction rub.  No murmur heard. Pulmonary/Chest: Effort normal and breath sounds normal. No respiratory distress. He has no wheezes. He has no rales.  Port-A-Cath in  place right upper chest  Neurological: He is alert and oriented to person, place, and time.  Skin: Skin is warm and dry.  Vitals reviewed.  Oncology notes reviewed Assessment  Colon carcinoma, finished with chemotherapy Plan   Patient is scheduled for Port-A-Cath removal in the minor procedure room on 02/26/2017.  The risks and benefits of the procedure were fully explained to the patient, who gave informed consent.

## 2017-02-22 NOTE — Patient Instructions (Signed)
Implanted Port Removal Implanted port removal is a procedure to remove the port and catheter (port-a-cath) that is implanted under your skin. The port is a small disc under your skin that can be punctured with a needle. It is connected to a vein in your chest or neck by a small flexible tube (catheter). The port-a-cath is used for treatment through an IV tube and for taking blood samples. Your health care provider will remove the port-a-cath if:  You no longer need it for treatment.  It is not working properly.  The area around it gets infected.  Tell a health care provider about:  Any allergies you have.  All medicines you are taking, including vitamins, herbs, eye drops, creams, and over-the-counter medicines.  Any problems you or family members have had with anesthetic medicines.  Any blood disorders you have.  Any surgeries you have had.  Any medical conditions you have.  Whether you are pregnant or may be pregnant. What are the risks? Generally, this is a safe procedure. However, problems may occur, including:  Infection.  Bleeding.  Allergic reactions to anesthetic medicines.  Damage to nerves or blood vessels.  What happens before the procedure?  You will have: ? A physical exam. ? Blood tests. ? Imaging tests, including a chest X-ray.  Follow instructions from your health care provider about eating or drinking restrictions.  Ask your health care provider about: ? Changing or stopping your regular medicines. This is especially important if you are taking diabetes medicines or blood thinners. ? Taking medicines such as aspirin and ibuprofen. These medicines can thin your blood. Do not take these medicines before your procedure if your surgeon instructs you not to.  Ask your health care provider how your surgical site will be marked or identified.  You may be given antibiotic medicine to help prevent infection.  Plan to have someone take you home after the  procedure.  If you will be going home right after the procedure, plan to have someone stay with you for 24 hours. What happens during the procedure?  To reduce your risk of infection: ? Your health care team will wash or sanitize their hands. ? Your skin will be washed with soap.  You may be given one or more of the following: ? A medicine to help you relax (sedative). ? A medicine to numb the area (local anesthetic).  A small cut (incision) will be made at the site of your port-a-cath.  The port-a-cath and the catheter that has been inside your vein will gently be removed.  The incision will be closed with stitches (sutures), adhesive strips, or skin glue.  A bandage (dressing) will be placed over the incision. The procedure may vary among health care providers and hospitals. What happens after the procedure?  Your blood pressure, heart rate, breathing rate, and blood oxygen level will be monitored often until the medicines you were given have worn off.  Do not drive for 24 hours if you received a sedative. This information is not intended to replace advice given to you by your health care provider. Make sure you discuss any questions you have with your health care provider. Document Released: 01/04/2015 Document Revised: 07/01/2015 Document Reviewed: 10/28/2014 Elsevier Interactive Patient Education  2018 Elsevier Inc.  

## 2017-02-22 NOTE — Progress Notes (Signed)
Erik HUEBERT; [id number]; 03/08/1955   HPI Patient is a 62 year old white male who was referred to my care by Dr. Talbert Cage for removal of a Port-A-Cath.  He underwent chemotherapy for colon cancer and is now finished with it.  He has no pain. Past Medical History:  Diagnosis Date  . Abnormal stress echocardiogram   . Adenocarcinoma of transverse colon ([place]) 02/10/2015  . Alcohol abuse    Heavy Use up until 2010  . Anxiety   . Blood transfusion without reported diagnosis   . COPD (chronic obstructive pulmonary disease) ([place])   . Depression   . Emphysema of lung ([place])   . GERD (gastroesophageal reflux disease)   . Head trauma 2001   closed head injury; coma for 4 weeks  . Hypercholesterolemia   . Hypertension   . ING HERN W/GANGREN RECUR UNILAT/UNSPEC ING HERN 04/21/2009   Qualifier: Diagnosis of  By: Verl Blalock, MD, Delanna Ahmadi PTSD (post-traumatic stress disorder)   . Pulmonary nodule 06/14/2016  . SAH (subarachnoid hemorrhage) ([place]) 08/31/2012  . SDH (subdural hematoma) ([place]) 08/31/2012    Past Surgical History:  Procedure Laterality Date  . BIOPSY  01/25/2016   Procedure: BIOPSY;  Surgeon: Danie Binder, MD;  [facility];  Service: Endoscopy;;  ileum;   . cardiac cath    . COLONOSCOPY  2011   Dr. Oneida Alar: multiple adenomas and hyperplastic polyps  . COLONOSCOPY WITH PROPOFOL N/A 01/12/2015   Procedure: COLONOSCOPY WITH PROPOFOL;  Surgeon: Danie Binder, MD;  [facility];  Service: Endoscopy;  Laterality: N/A;  1030  . COLONOSCOPY WITH PROPOFOL N/A 01/25/2016   Procedure: COLONOSCOPY WITH PROPOFOL;  Surgeon: Danie Binder, MD;  [facility];  Service: Endoscopy;  Laterality: N/A;  1230  . CRANIOTOMY  2001  . ESOPHAGOGASTRODUODENOSCOPY (EGD) WITH PROPOFOL N/A 01/12/2015   Procedure: ESOPHAGOGASTRODUODENOSCOPY (EGD) WITH PROPOFOL;  Surgeon: Danie Binder, MD;  [facility];  Service: Endoscopy;  Laterality: N/A;  . head injury surgery     . HERNIA REPAIR Right 2012   Inguinal- Forestine Na  . KIDNEY SURGERY     >30 years ago  . LAPAROSCOPIC RIGHT HEMI COLECTOMY Left 02/10/2015   Procedure: LAPAROSCOPIC THEN OPEN RIGHT HEMI COLECTOMY;  Surgeon: [name], MD;  Location: [facility];  Service: General;  Laterality: Left;  . POLYPECTOMY  01/25/2016   Procedure: POLYPECTOMY;  Surgeon: Danie Binder, MD;  [facility];  Service: Endoscopy;;  colon  . PORTACATH PLACEMENT N/A 03/24/2015   Procedure: INSERTION PORT-A-CATH;  Surgeon: Jules Husbands, MD;  Location: [facility];  Service: General;  Laterality: N/A;    Family History  Problem Relation Age of Onset  . Pulmonary embolism Mother   . Breast cancer Mother   . Cancer Mother        breast cancer  . Arthritis Mother   . Heart disease Father   . Cancer Father 10       Leukemia  . Cancer Paternal Grandfather        Lung  . Ataxia Neg Hx   . Chorea Neg Hx   . Dementia Neg Hx   . Mental retardation Neg Hx   . Migraines Neg Hx   . Multiple sclerosis Neg Hx   . Neurofibromatosis Neg Hx   . Neuropathy Neg Hx   . Parkinsonism Neg Hx   . Seizures Neg Hx   . Stroke Neg Hx   .  Colon cancer Neg Hx     Current Outpatient Medications on File Prior to Visit  Medication Sig Dispense Refill  . amitriptyline (ELAVIL) 25 MG tablet TAKE ONE TABLET BY MOUTH EVERY NIGHT AT BEDTIME 30 tablet 1  . atorvastatin (LIPITOR) 20 MG tablet Take 1 tablet (20 mg total) by mouth at bedtime. 90 tablet 3  . buPROPion (WELLBUTRIN SR) 150 MG 12 hr tablet Take 150 mg by mouth 2 (two) times daily.    . cyanocobalamin (,VITAMIN B-12,) 1000 MCG/ML injection Inject 1,000 mcg into the muscle every 30 (thirty) days.    Marland Kitchen FLUoxetine (PROZAC) 20 MG capsule TAKE 1 CAPSULE BY MOUTH 2 TIMES A DAY 60 capsule 0  . gabapentin (NEURONTIN) 300 MG capsule TAKE 4 CAPSULES BY MOUTH EVERY DAY AT BEDTIME 120 capsule 2  . HYDROcodone-acetaminophen (NORCO) 5-325 MG tablet Take 1 tablet by mouth every 6 (six)  hours as needed for moderate pain. 60 tablet 0  . loperamide (IMODIUM) 2 MG capsule TAKE 1 TO 2 CAPSULES BY MOUTH EVERY 6 HOURS AS NEEDED FOR DIARRHEA OR LOOSE STOOLS. (Patient taking differently: Take 4 mg by mouth daily. TAKE 1 TO 2 CAPSULES BY MOUTH EVERY 6 HOURS AS NEEDED FOR DIARRHEA OR LOOSE STOOLS.) 60 capsule 3  . loperamide (IMODIUM) 2 MG capsule TAKE 1 TO 2 CAPSULES BY MOUTH EVERY 6 HOURS AS NEEDED FOR DIARRHEA OR LOOSE STOOLS 60 capsule 1  . omeprazole (PRILOSEC) 20 MG capsule Take 1 capsule (20 mg total) by mouth daily. 90 capsule 1  . PROAIR HFA 108 (90 Base) MCG/ACT inhaler INHALE 2 PUFFS BY MOUTH EVERY 6 HOURS AS NEEDED FOR SHORTNESS OF BREATH/WHEEZING. 18 each 2   Current Facility-Administered Medications on File Prior to Visit  Medication Dose Route Frequency Provider Last Rate Last Dose  . sodium chloride flush (NS) 0.9 % injection 10 mL  10 mL Intracatheter PRN Penland, Kelby Fam, MD   10 mL at 08/23/15 1030    No Known Allergies  Social History   Substance and Sexual Activity  Alcohol Use Yes  . Alcohol/week: 0.0 oz   Comment: beer occ, history of ETOH abuse in remote past.     Social History   Tobacco Use  Smoking Status Current Some Day Smoker  . Packs/day: 0.10  . Years: 30.00  . Pack years: 3.00  . Types: Cigarettes  . Start date: 02/06/1973  Smokeless Tobacco Never Used  Tobacco Comment   smokes every 2-3 days, 2-3 a day when smokes    Review of Systems  Constitutional: Positive for malaise/fatigue.  HENT: Positive for sinus pain.   Eyes: Positive for blurred vision.  Respiratory: Positive for shortness of breath.   Cardiovascular: Negative.   Gastrointestinal: Positive for abdominal pain.  Genitourinary: Positive for urgency.  Musculoskeletal: Positive for back pain and joint pain.  Skin: Negative.   Neurological: Negative.   Endo/Heme/Allergies: Negative.   Psychiatric/Behavioral: Negative.     Objective   Vitals:   02/22/17 0922  BP: (!)  142/73  Pulse: 91  Temp: (!) 96.4 F (35.8 C)    Physical Exam  Constitutional: He is oriented to person, place, and time and well-developed, well-nourished, and in no distress.  HENT:  Head: Normocephalic and atraumatic.  Cardiovascular: Normal rate, regular rhythm and normal heart sounds. Exam reveals no gallop and no friction rub.  No murmur heard. Pulmonary/Chest: Effort normal and breath sounds normal. No respiratory distress. He has no wheezes. He has no rales.  Port-A-Cath in  place right upper chest  Neurological: He is alert and oriented to person, place, and time.  Skin: Skin is warm and dry.  Vitals reviewed.  Oncology notes reviewed Assessment  Colon carcinoma, finished with chemotherapy Plan   Patient is scheduled for Port-A-Cath removal in the minor procedure room on 02/26/2017.  The risks and benefits of the procedure were fully explained to the patient, who gave informed consent.

## 2017-02-26 ENCOUNTER — Encounter (HOSPITAL_COMMUNITY)
Admission: RE | Disposition: A | Discharge status: Home / Self Care | Payer: Self-pay | Source: Ambulatory Visit | Attending: General Surgery

## 2017-02-26 ENCOUNTER — Ambulatory Visit (HOSPITAL_COMMUNITY)
Admission: RE | Admit: 2017-02-26 | Discharge: 2017-02-26 | Disposition: A | Discharge status: Home / Self Care | Payer: Medicaid Other | Source: Ambulatory Visit | Attending: General Surgery | Admitting: General Surgery

## 2017-02-26 DIAGNOSIS — C184 Malignant neoplasm of transverse colon: Secondary | ICD-10-CM

## 2017-02-26 DIAGNOSIS — I10 Essential (primary) hypertension: Secondary | ICD-10-CM | POA: Insufficient documentation

## 2017-02-26 DIAGNOSIS — E7800 Pure hypercholesterolemia, unspecified: Secondary | ICD-10-CM | POA: Insufficient documentation

## 2017-02-26 DIAGNOSIS — Z79899 Other long term (current) drug therapy: Secondary | ICD-10-CM | POA: Insufficient documentation

## 2017-02-26 DIAGNOSIS — Z452 Encounter for adjustment and management of vascular access device: Secondary | ICD-10-CM | POA: Diagnosis present

## 2017-02-26 DIAGNOSIS — Z801 Family history of malignant neoplasm of trachea, bronchus and lung: Secondary | ICD-10-CM | POA: Insufficient documentation

## 2017-02-26 DIAGNOSIS — K219 Gastro-esophageal reflux disease without esophagitis: Secondary | ICD-10-CM | POA: Insufficient documentation

## 2017-02-26 DIAGNOSIS — F17210 Nicotine dependence, cigarettes, uncomplicated: Secondary | ICD-10-CM | POA: Diagnosis not present

## 2017-02-26 DIAGNOSIS — Z806 Family history of leukemia: Secondary | ICD-10-CM | POA: Diagnosis not present

## 2017-02-26 DIAGNOSIS — Z803 Family history of malignant neoplasm of breast: Secondary | ICD-10-CM | POA: Insufficient documentation

## 2017-02-26 DIAGNOSIS — Z85038 Personal history of other malignant neoplasm of large intestine: Secondary | ICD-10-CM | POA: Diagnosis not present

## 2017-02-26 DIAGNOSIS — J439 Emphysema, unspecified: Secondary | ICD-10-CM | POA: Diagnosis not present

## 2017-02-26 DIAGNOSIS — F4310 Post-traumatic stress disorder, unspecified: Secondary | ICD-10-CM | POA: Diagnosis not present

## 2017-02-26 DIAGNOSIS — F329 Major depressive disorder, single episode, unspecified: Secondary | ICD-10-CM | POA: Diagnosis not present

## 2017-02-26 DIAGNOSIS — Z9221 Personal history of antineoplastic chemotherapy: Secondary | ICD-10-CM | POA: Diagnosis not present

## 2017-02-26 HISTORY — PX: PORT-A-CATH REMOVAL: SHX5289

## 2017-02-26 SURGERY — MINOR REMOVAL PORT-A-CATH
Anesthesia: LOCAL

## 2017-02-26 MED ORDER — LIDOCAINE HCL (PF) 1 % IJ SOLN
INTRAMUSCULAR | Status: AC
  Filled 2017-02-26: qty 30

## 2017-02-26 MED ORDER — LIDOCAINE HCL (PF) 1 % IJ SOLN
INTRAMUSCULAR | Status: DC | PRN
  Administered 2017-02-26: 4 mL via SUBCUTANEOUS

## 2017-02-26 SURGICAL SUPPLY — 20 items
CHLORAPREP W/TINT 10.5 ML (MISCELLANEOUS) ×2 IMPLANT
CLOTH BEACON ORANGE TIMEOUT ST (SAFETY) ×2 IMPLANT
DECANTER SPIKE VIAL GLASS SM (MISCELLANEOUS) ×2 IMPLANT
DERMABOND ADVANCED (GAUZE/BANDAGES/DRESSINGS) ×1
DERMABOND ADVANCED .7 DNX12 (GAUZE/BANDAGES/DRESSINGS) ×1 IMPLANT
DRAPE PROXIMA HALF (DRAPES) ×2 IMPLANT
ELECT REM PT RETURN 9FT ADLT (ELECTROSURGICAL) ×2
ELECTRODE REM PT RTRN 9FT ADLT (ELECTROSURGICAL) ×1 IMPLANT
GLOVE BIOGEL PI IND STRL 7.0 (GLOVE) ×1 IMPLANT
GLOVE BIOGEL PI INDICATOR 7.0 (GLOVE) ×1
GLOVE SURG SS PI 7.5 STRL IVOR (GLOVE) ×2 IMPLANT
GOWN STRL REUS W/TWL LRG LVL3 (GOWN DISPOSABLE) ×2 IMPLANT
NEEDLE HYPO 25X1 1.5 SAFETY (NEEDLE) ×2 IMPLANT
PENCIL HANDSWITCHING (ELECTRODE) ×2 IMPLANT
SPONGE GAUZE 2X2 8PLY STRL LF (GAUZE/BANDAGES/DRESSINGS) ×2 IMPLANT
SUT MNCRL AB 4-0 PS2 18 (SUTURE) ×2 IMPLANT
SUT VIC AB 3-0 SH 27 (SUTURE) ×1
SUT VIC AB 3-0 SH 27X BRD (SUTURE) ×1 IMPLANT
SYR CONTROL 10ML LL (SYRINGE) ×2 IMPLANT
TOWEL OR 17X26 4PK STRL BLUE (TOWEL DISPOSABLE) ×2 IMPLANT

## 2017-02-26 NOTE — Discharge Instructions (Signed)
Implanted Port Removal, Care After °Refer to this sheet in the next few weeks. These instructions provide you with information about caring for yourself after your procedure. Your health care provider may also give you more specific instructions. Your treatment has been planned according to current medical practices, but problems sometimes occur. Call your health care provider if you have any problems or questions after your procedure. °What can I expect after the procedure? °After the procedure, it is common to have: °· Soreness or pain near your incision. °· Some swelling or bruising near your incision. ° °Follow these instructions at home: °Medicines °· Take over-the-counter and prescription medicines only as told by your health care provider. °· If you were prescribed an antibiotic medicine, take it as told by your health care provider. Do not stop taking the antibiotic even if you start to feel better. °Bathing °· Do not take baths, swim, or use a hot tub until your health care provider approves. Ask your health care provider if you can take showers. You may only be allowed to take sponge baths for bathing. °Incision care °· Follow instructions from your health care provider about how to take care of your incision. Make sure you: °? Wash your hands with soap and water before you change your bandage (dressing). If soap and water are not available, use hand sanitizer. °? Change your dressing as told by your health care provider. °? Keep your dressing dry. °? Leave stitches (sutures), skin glue, or adhesive strips in place. These skin closures may need to stay in place for 2 weeks or longer. If adhesive strip edges start to loosen and curl up, you may trim the loose edges. Do not remove adhesive strips completely unless your health care provider tells you to do that. °· Check your incision area every day for signs of infection. Check for: °? More redness, swelling, or pain. °? More fluid or  blood. °? Warmth. °? Pus or a bad smell. °Driving °· If you received a sedative, do not drive for 24 hours after the procedure. °· If you did not receive a sedative, ask your health care provider when it is safe to drive. °Activity °· Return to your normal activities as told by your health care provider. Ask your health care provider what activities are safe for you. °· Until your health care provider says it is safe: °? Do not lift anything that is heavier than 10 lb (4.5 kg). °? Do not do activities that involve lifting your arms over your head. °General instructions °· Do not use any tobacco products, such as cigarettes, chewing tobacco, and e-cigarettes. Tobacco can delay healing. If you need help quitting, ask your health care provider. °· Keep all follow-up visits as told by your health care provider. This is important. °Contact a health care provider if: °· You have more redness, swelling, or pain around your incision. °· You have more fluid or blood coming from your incision. °· Your incision feels warm to the touch. °· You have pus or a bad smell coming from your incision. °· You have a fever. °· You have pain that is not relieved by your pain medicine. °Get help right away if: °· You have chest pain. °· You have difficulty breathing. °This information is not intended to replace advice given to you by your health care provider. Make sure you discuss any questions you have with your health care provider. °Document Released: 01/04/2015 Document Revised: 07/01/2015 Document Reviewed: 10/28/2014 °Elsevier Interactive Patient   Education © 2018 Elsevier Inc. ° °

## 2017-02-26 NOTE — Interval H&P Note (Signed)
History and Physical Interval Note:  02/26/2017 8:46 AM  Erik [name]  has presented today for surgery, with the diagnosis of colon cancer  The various methods of treatment have been discussed with the patient and family. After consideration of risks, benefits and other options for treatment, the patient has consented to  Procedure(s) with comments: MINOR REMOVAL PORT-A-CATH (N/A) - Pt to arrive at 8:00am as a surgical intervention .  The patient's history has been reviewed, patient examined, no change in status, stable for surgery.  I have reviewed the patient's chart and labs.  Questions were answered to the patient's satisfaction.     Aviva Signs

## 2017-02-26 NOTE — Op Note (Signed)
Patient:  Erik [name]  DOB:  Jul 29, 1955  MRN:  [id number]   Preop Diagnosis: Colon carcinoma, finished with chemotherapy  Postop Diagnosis: Same  Procedure: Port-A-Cath removal  Surgeon: Aviva Signs, MD  Anes: Local  Indications: Patient is a 62 year old white male with colon carcinoma who has finished with chemotherapy.  He was referred for Port-A-Cath removal.  The risks and benefits of the procedure including bleeding and infection were fully explained to the patient, who gave informed consent.  Procedure note: The patient was placed in supine position.  The right upper chest was prepped and draped using the usual sterile technique with DuraPrep.  Surgical site confirmation was performed.  1% Xylocaine was used for local anesthesia.  Incision was made through the previous surgical incision site.  This was taken down to the port.  The Port-A-Cath was removed in total without difficulty.  It was disposed of.  The subcutaneous layer was reapproximated using 3-0 Vicryl interrupted sutures.  The skin was closed using a 4-0 Vicryl subcuticular suture.  Dermabond was applied.  All tape needle counts were correct at the end of the procedure.  Patient was discharged from the minor procedure room in good and stable condition.  Complications: None  EBL: Minimal  Specimen: None

## 2017-02-27 ENCOUNTER — Encounter (HOSPITAL_COMMUNITY): Payer: Self-pay | Admitting: General Surgery

## 2017-02-27 ENCOUNTER — Ambulatory Visit: Payer: Medicaid Other | Admitting: Family Medicine

## 2017-03-02 ENCOUNTER — Other Ambulatory Visit (HOSPITAL_COMMUNITY): Payer: Self-pay | Admitting: Adult Health

## 2017-03-02 DIAGNOSIS — C184 Malignant neoplasm of transverse colon: Secondary | ICD-10-CM

## 2017-03-12 ENCOUNTER — Ambulatory Visit (INDEPENDENT_AMBULATORY_CARE_PROVIDER_SITE_OTHER): Payer: Medicaid Other | Admitting: Family Medicine

## 2017-03-12 ENCOUNTER — Other Ambulatory Visit: Payer: Self-pay

## 2017-03-12 ENCOUNTER — Encounter: Payer: Self-pay | Admitting: Family Medicine

## 2017-03-12 VITALS — BP 128/74 | HR 92 | Temp 97.6°F | Resp 18 | Ht 74.0 in | Wt 234.1 lb

## 2017-03-12 DIAGNOSIS — F329 Major depressive disorder, single episode, unspecified: Secondary | ICD-10-CM

## 2017-03-12 DIAGNOSIS — F17219 Nicotine dependence, cigarettes, with unspecified nicotine-induced disorders: Secondary | ICD-10-CM | POA: Diagnosis not present

## 2017-03-12 DIAGNOSIS — K219 Gastro-esophageal reflux disease without esophagitis: Secondary | ICD-10-CM

## 2017-03-12 DIAGNOSIS — M7552 Bursitis of left shoulder: Secondary | ICD-10-CM

## 2017-03-12 DIAGNOSIS — F32A Depression, unspecified: Secondary | ICD-10-CM

## 2017-03-12 DIAGNOSIS — M25512 Pain in left shoulder: Secondary | ICD-10-CM

## 2017-03-12 MED ORDER — HYDROCODONE-ACETAMINOPHEN 7.5-325 MG PO TABS: 1.0000 | ORAL_TABLET | Freq: Four times a day (QID) | ORAL | 0 refills | Status: DC | PRN

## 2017-03-12 MED ORDER — METHYLPREDNISOLONE ACETATE 80 MG/ML IJ SUSP
80.0000 mg | Freq: Once | INTRAMUSCULAR | Status: AC
  Administered 2017-03-12: 80 mg via INTRA_ARTICULAR

## 2017-03-12 NOTE — Addendum Note (Signed)
Addended by: Ova Freshwater on: 03/12/2017 11:01 AM   Modules accepted: Orders

## 2017-03-12 NOTE — Patient Instructions (Addendum)
Use ice to shoulder 20 min every few hours all day Take the pain medicine as needed ( sent to the [facility]) Gentle motion exercises Call if not better in a week  No change in medicine BP is good Try to increase activity as weather improves Try to QUIT the smoking  See me in 3 months

## 2017-03-12 NOTE — Progress Notes (Signed)
Chief Complaint  Patient presents with  . Follow-up    3 month   Patient is here for routine 29-month follow-up. He is pleased be doing well with his cancer treatment.  He had his Port-A-Cath removed.  His CEA remains low.  His only complaint is that he continues to have diarrhea ever since his colon resection.  He also has persistent neuropathy after his chemotherapy. Is under the care of psychiatry for his depression.  He is taking his medication.  He states he generally is stable but still has, bad days". He has had a hold of head injury.  He has poor balance.  He walks with a cane.  He has not had any falls. He is trying to quit cigarettes.  He states that a pack of cigarettes lasts him several days.  We discussed the importance of complete smoking cessation both for his cancer diagnosis but also his COPD lung disease and coronary artery disease.  He states he will continue to work on this.  He denies need for any assistance. He states that he has acute pain in his left shoulder.  This been present for about 3 weeks.  He had no accident or injury.  No overuse.  It is his left shoulder, and he is right-handed.  He has not had any strain or fall.  He is never had shoulder problems before.  It hurts from the lower neck region all the way down to the upper arm.  No numbness or weakness into the hands.  No limitation in neck movement.  No prior neck problems.  He states that the shoulder hurts in both during the day and at night.  He is cradling his left arm up close to his body and states that it is getting worse over time, now he feels like he can hardly move the arm without severe pain. He has for refill of his omeprazole.  He takes this for GERD.  He states this gives him good relief of heartburn.  Patient Active Problem List   Diagnosis Date Noted  . Psoriasis 09/12/2016  . Pulmonary nodule 06/14/2016  . History of colon cancer   . Diarrhea 01/06/2016  . Cigarette nicotine dependence with  nicotine-induced disorder 06/18/2015  . Chronic obstructive pulmonary disease ([place]) 06/18/2015  . Depression 06/18/2015  . B12 deficiency 04/14/2015  . Malignant neoplasm of transverse colon ([place]) 02/10/2015  . GERD (gastroesophageal reflux disease) 12/15/2014  . History of colonic polyps 12/15/2014  . History of alcohol abuse 09/16/2014  . Panic disorder 10/10/2012  . Major depressive disorder, single episode 10/10/2012  . Bilateral lower extremity edema 04/24/2012  . Hyperlipidemia 04/21/2009  . CAD, NATIVE VESSEL 03/25/2009  . HEAD TRAUMA 03/02/2009    Outpatient Encounter Medications as of 03/12/2017  Medication Sig  . acetaminophen (TYLENOL) 500 MG tablet Take 1,000 mg by mouth every 6 (six) hours as needed for moderate pain or headache.  Marland Kitchen amitriptyline (ELAVIL) 25 MG tablet TAKE ONE TABLET BY MOUTH EVERY NIGHT AT BEDTIME  . atorvastatin (LIPITOR) 20 MG tablet Take 1 tablet (20 mg total) by mouth at bedtime.  Marland Kitchen buPROPion (WELLBUTRIN SR) 150 MG 12 hr tablet Take 150 mg by mouth 2 (two) times daily.  . cyanocobalamin (,VITAMIN B-12,) 1000 MCG/ML injection Inject 1,000 mcg into the muscle every 30 (thirty) days.  Marland Kitchen FLUoxetine (PROZAC) 20 MG capsule TAKE 1 CAPSULE BY MOUTH 2 TIMES A DAY (Patient taking differently: Take 20 mg by mouth 2 (two) times  daily. )  . gabapentin (NEURONTIN) 300 MG capsule TAKE 4 CAPSULES BY MOUTH EVERY DAY AT BEDTIME (Patient taking differently: TAKE 900 MG BY MOUTH EVERY DAY AT BEDTIME)  . ibuprofen (ADVIL,MOTRIN) 200 MG tablet Take 400 mg by mouth every 6 (six) hours as needed for headache or moderate pain.  Marland Kitchen loperamide (IMODIUM) 2 MG capsule TAKE 1 TO 2 CAPSULES BY MOUTH EVERY 6 HOURS AS NEEDED FOR DIARRHEA OR LOOSE STOOLS  . omeprazole (PRILOSEC) 20 MG capsule Take 20 mg by mouth daily.  Marland Kitchen PROAIR HFA 108 (90 Base) MCG/ACT inhaler INHALE 2 PUFFS BY MOUTH EVERY 6 HOURS AS NEEDED FOR SHORTNESS OF BREATH/WHEEZING.  Marland Kitchen HYDROcodone-acetaminophen (NORCO) 7.5-325 MG  tablet Take 1 tablet by mouth every 6 (six) hours as needed for moderate pain.   No facility-administered encounter medications on file as of 03/12/2017.     No Known Allergies  Review of Systems  Constitutional: Positive for fatigue. Negative for activity change, appetite change and unexpected weight change.       Chronic  HENT: Negative for congestion and dental problem.   Eyes: Negative for photophobia and visual disturbance.  Respiratory: Positive for shortness of breath. Negative for chest tightness and wheezing.        COPD/smoker  Cardiovascular: Negative for chest pain, palpitations and leg swelling.  Gastrointestinal: Positive for diarrhea. Negative for blood in stool and constipation.       Chronic after bowel resection  Genitourinary: Negative for difficulty urinating and frequency.  Musculoskeletal: Positive for arthralgias and back pain.       New left shoulder pain.  Neurological: Negative for light-headedness and headaches.  Psychiatric/Behavioral: Positive for dysphoric mood. Negative for self-injury and suicidal ideas. The patient is nervous/anxious.        "Good days and bad days"    BP 128/74 (BP Location: Right Arm, Patient Position: Sitting, Cuff Size: Large)   Pulse 92   Temp 97.6 F (36.4 C) (Temporal)   Resp 18   Ht 6\' 2"  (1.88 m)   Wt 234 lb 1.3 oz (106.2 kg)   SpO2 96%   BMI 30.05 kg/m   Physical Exam  Constitutional: He is oriented to person, place, and time. He appears well-developed and well-nourished. No distress.  Smells of tobacco/body odor  HENT:  Head: Normocephalic and atraumatic.  Mouth/Throat: Oropharynx is clear and moist.  Eyes: EOM are normal. Pupils are equal, round, and reactive to light.  Neck: Normal range of motion. Neck supple.  His neck motion is normal.  No palpable muscle spasm or tenderness.  Cardiovascular: Normal rate, regular rhythm and normal heart sounds.  Pulmonary/Chest: Effort normal and breath sounds normal. No  respiratory distress.  Abdominal: Soft. Bowel sounds are normal.  Musculoskeletal: Normal range of motion. He exhibits no edema.  Patient appears mildly uncomfortable.  He holds his left arm close to his body.  He resists any range of motion.  He can only abduct and extend the arm no more than a few inches away from his body.  He has pain with passive motion as well.  There is tenderness over the subacromial bursa.  Grip strength is normal.  Reflexes are normal.  Sensory exam is normal.  Lymphadenopathy:    He has no cervical adenopathy.  Neurological: He is alert and oriented to person, place, and time. Coordination abnormal.  Balance poor  Skin:  Active psoriasis both shins with silver plaque on red base  Psychiatric: He has a normal mood and affect.  His behavior is normal.  Time out Consent Posterior (    L    ) shoulder prepped and marked 1cc of 1% lidocaine wheal placed at injection site 3 cc of 1% lidocaine with 40 mg of DepoMedrol placed into subacromial space Patient tolerated procedure well Post injection instructions reviewed.  ASSESSMENT/PLAN:  1. Nontraumatic pain of left shoulder Discussed conservative management.  Anti-inflammatory medicines.  I gave him a cortisone shot today with instructions on care and expectations. Demonstrated patient pendulum exercises and wall walking exercises.  He is to start these in 2-3 days as his shoulder pain starts to improve.  The importance of actively working on regaining his range of motion is discussed with him.  If he is not not successful, he may need orthopedic referral for physical therapy. - PR DRAIN/INJECT LARGE JOINT/BURSA  2. Acute shoulder bursitis, left  - PR DRAIN/INJECT LARGE JOINT/BURSA  3. Depression, unspecified depression type Under care of psychiatry  4. Cigarette nicotine dependence with nicotine-induced disorder Discussed smoking cessation  5. Gastroesophageal reflux disease, esophagitis presence not  specified Refilled omeprazole   Patient Instructions  Use ice to shoulder 20 min every few hours all day Take the pain medicine as needed ( sent to the [facility]) Gentle motion exercises Call if not better in a week  No change in medicine BP is good Try to increase activity as weather improves Try to QUIT the smoking  See me in 3 months     Raylene Everts, MD

## 2017-03-30 ENCOUNTER — Other Ambulatory Visit (HOSPITAL_COMMUNITY): Payer: Self-pay | Admitting: Adult Health

## 2017-03-30 DIAGNOSIS — C184 Malignant neoplasm of transverse colon: Secondary | ICD-10-CM

## 2017-04-16 ENCOUNTER — Encounter: Payer: Self-pay | Admitting: Family Medicine

## 2017-04-27 ENCOUNTER — Other Ambulatory Visit (HOSPITAL_COMMUNITY): Payer: Self-pay | Admitting: Adult Health

## 2017-04-27 ENCOUNTER — Other Ambulatory Visit (HOSPITAL_COMMUNITY): Payer: Self-pay | Admitting: Oncology

## 2017-04-27 DIAGNOSIS — C184 Malignant neoplasm of transverse colon: Secondary | ICD-10-CM

## 2017-04-27 DIAGNOSIS — G620 Drug-induced polyneuropathy: Secondary | ICD-10-CM

## 2017-04-27 DIAGNOSIS — T451X5A Adverse effect of antineoplastic and immunosuppressive drugs, initial encounter: Principal | ICD-10-CM

## 2017-05-24 ENCOUNTER — Other Ambulatory Visit: Payer: Self-pay | Admitting: Family Medicine

## 2017-05-24 ENCOUNTER — Other Ambulatory Visit (HOSPITAL_COMMUNITY): Payer: Self-pay | Admitting: Adult Health

## 2017-05-24 DIAGNOSIS — C184 Malignant neoplasm of transverse colon: Secondary | ICD-10-CM

## 2017-06-01 ENCOUNTER — Encounter: Payer: Self-pay | Admitting: Neurology

## 2017-06-04 ENCOUNTER — Telehealth: Payer: Self-pay | Admitting: Family Medicine

## 2017-06-04 NOTE — Telephone Encounter (Signed)
[facility] called and said you would take this patient.  I told her I could not make the appt since we were not aware of this.  Please advise.

## 2017-06-04 NOTE — Telephone Encounter (Signed)
Please advise that we are not taking new patients at this time.

## 2017-06-11 ENCOUNTER — Ambulatory Visit: Payer: Medicaid Other | Admitting: Family Medicine

## 2017-06-14 ENCOUNTER — Telehealth: Payer: Self-pay | Admitting: Family Medicine

## 2017-06-14 NOTE — Telephone Encounter (Signed)
Per Dr Mannie Stabile - ok to schedule this patient after she talked with Nicki Reaper.

## 2017-06-15 ENCOUNTER — Other Ambulatory Visit: Payer: Self-pay | Admitting: Adult Health

## 2017-06-21 ENCOUNTER — Other Ambulatory Visit (HOSPITAL_COMMUNITY): Payer: Self-pay | Admitting: Adult Health

## 2017-06-21 DIAGNOSIS — T451X5A Adverse effect of antineoplastic and immunosuppressive drugs, initial encounter: Principal | ICD-10-CM

## 2017-06-21 DIAGNOSIS — C184 Malignant neoplasm of transverse colon: Secondary | ICD-10-CM

## 2017-06-21 DIAGNOSIS — G620 Drug-induced polyneuropathy: Secondary | ICD-10-CM

## 2017-06-22 NOTE — Telephone Encounter (Signed)
  Please have Dr. Delton Coombes refill, if appropriate.   Mike Craze, NP [place] [phone]

## 2017-06-22 NOTE — Telephone Encounter (Signed)
Please have Dr. Delton Coombes refill, if appropriate.   Mike Craze, NP [place] [phone]

## 2017-07-19 ENCOUNTER — Other Ambulatory Visit: Payer: Self-pay | Admitting: Family Medicine

## 2017-07-19 ENCOUNTER — Other Ambulatory Visit (HOSPITAL_COMMUNITY): Payer: Self-pay | Admitting: Hematology

## 2017-07-19 DIAGNOSIS — T451X5A Adverse effect of antineoplastic and immunosuppressive drugs, initial encounter: Secondary | ICD-10-CM

## 2017-07-19 DIAGNOSIS — G620 Drug-induced polyneuropathy: Secondary | ICD-10-CM

## 2017-07-19 DIAGNOSIS — C184 Malignant neoplasm of transverse colon: Secondary | ICD-10-CM

## 2017-07-23 ENCOUNTER — Inpatient Hospital Stay (HOSPITAL_COMMUNITY): Payer: Medicaid Other | Attending: Hematology

## 2017-07-23 DIAGNOSIS — E538 Deficiency of other specified B group vitamins: Secondary | ICD-10-CM | POA: Insufficient documentation

## 2017-07-23 DIAGNOSIS — C184 Malignant neoplasm of transverse colon: Secondary | ICD-10-CM | POA: Diagnosis not present

## 2017-07-23 DIAGNOSIS — R911 Solitary pulmonary nodule: Secondary | ICD-10-CM | POA: Diagnosis not present

## 2017-07-23 DIAGNOSIS — I10 Essential (primary) hypertension: Secondary | ICD-10-CM | POA: Diagnosis not present

## 2017-07-23 DIAGNOSIS — G43A0 Cyclical vomiting, not intractable: Secondary | ICD-10-CM | POA: Insufficient documentation

## 2017-07-23 LAB — COMPREHENSIVE METABOLIC PANEL WITH GFR
ALT: 18 U/L (ref 17–63)
AST: 21 U/L (ref 15–41)
Albumin: 3.8 g/dL (ref 3.5–5.0)
Alkaline Phosphatase: 64 U/L (ref 38–126)
Anion gap: 9 (ref 5–15)
BUN: 14 mg/dL (ref 6–20)
CO2: 29 mmol/L (ref 22–32)
Calcium: 9 mg/dL (ref 8.9–10.3)
Chloride: 103 mmol/L (ref 101–111)
Creatinine, Ser: 0.95 mg/dL (ref 0.61–1.24)
GFR calc Af Amer: >60 mL/min (ref >60)
GFR calc non Af Amer: >60 mL/min (ref >60)
Glucose, Bld: 89 mg/dL (ref 65–99)
Potassium: 4.6 mmol/L (ref 3.5–5.1)
Sodium: 141 mmol/L (ref 135–145)
Total Bilirubin: 0.6 mg/dL (ref 0.3–1.2)
Total Protein: 7.3 g/dL (ref 6.5–8.1)

## 2017-07-23 LAB — CBC WITH DIFFERENTIAL/PLATELET
BASOS ABS: 0.1 10*3/uL (ref 0.0–0.1)
Basophils Relative: 2 %
EOS PCT: 2 %
Eosinophils Absolute: 0.1 10*3/uL (ref 0.0–0.7)
HEMATOCRIT: 43.7 % (ref 39.0–52.0)
HEMOGLOBIN: 13.9 g/dL (ref 13.0–17.0)
LYMPHS PCT: 32 %
Lymphs Abs: 1.5 10*3/uL (ref 0.7–4.0)
MCH: 31.4 pg (ref 26.0–34.0)
MCHC: 31.8 g/dL (ref 30.0–36.0)
MCV: 98.6 fL (ref 78.0–100.0)
Monocytes Absolute: 0.4 10*3/uL (ref 0.1–1.0)
Monocytes Relative: 10 %
NEUTROS ABS: 2.5 10*3/uL (ref 1.7–7.7)
NEUTROS PCT: 54 %
Platelets: 208 10*3/uL (ref 150–400)
RBC: 4.43 MIL/uL (ref 4.22–5.81)
RDW: 13.6 % (ref 11.5–15.5)
WBC: 4.6 10*3/uL (ref 4.0–10.5)

## 2017-07-23 LAB — VITAMIN B12: Vitamin B-12: 151 pg/mL — ABNORMAL LOW (ref 180–914)

## 2017-07-24 LAB — CEA: CEA: 3.5 ng/mL (ref 0.0–4.7)

## 2017-07-30 ENCOUNTER — Ambulatory Visit (HOSPITAL_COMMUNITY): Payer: Medicaid Other

## 2017-07-31 ENCOUNTER — Encounter (HOSPITAL_COMMUNITY): Payer: Self-pay | Admitting: Internal Medicine

## 2017-07-31 ENCOUNTER — Inpatient Hospital Stay (HOSPITAL_BASED_OUTPATIENT_CLINIC_OR_DEPARTMENT_OTHER): Payer: Medicaid Other | Admitting: Internal Medicine

## 2017-07-31 VITALS — BP 116/72 | HR 98 | Temp 97.3°F | Resp 18 | Wt 243.3 lb

## 2017-07-31 DIAGNOSIS — C184 Malignant neoplasm of transverse colon: Secondary | ICD-10-CM | POA: Diagnosis not present

## 2017-07-31 DIAGNOSIS — R918 Other nonspecific abnormal finding of lung field: Secondary | ICD-10-CM

## 2017-07-31 DIAGNOSIS — E538 Deficiency of other specified B group vitamins: Secondary | ICD-10-CM

## 2017-07-31 DIAGNOSIS — R911 Solitary pulmonary nodule: Secondary | ICD-10-CM | POA: Diagnosis not present

## 2017-07-31 DIAGNOSIS — G43A0 Cyclical vomiting, not intractable: Secondary | ICD-10-CM | POA: Diagnosis not present

## 2017-07-31 DIAGNOSIS — I10 Essential (primary) hypertension: Secondary | ICD-10-CM | POA: Diagnosis not present

## 2017-08-02 ENCOUNTER — Ambulatory Visit (HOSPITAL_COMMUNITY): Payer: Medicaid Other | Admitting: Adult Health

## 2017-08-02 ENCOUNTER — Ambulatory Visit (HOSPITAL_COMMUNITY): Payer: Medicaid Other | Admitting: Internal Medicine

## 2017-08-03 NOTE — Progress Notes (Signed)
Diagnosis Pulmonary nodule - Plan: CBC with Differential/Platelet, Comprehensive metabolic panel, Lactate dehydrogenase, Ferritin, CEA  Abnormal findings on diagnostic imaging of lung - Plan: CT CHEST W CONTRAST, CBC with Differential/Platelet, Comprehensive metabolic panel, Lactate dehydrogenase, Ferritin, CEA  Cyclical vomiting with nausea, intractability of vomiting not specified - Plan: CT Abdomen Pelvis W Contrast, CBC with Differential/Platelet, Comprehensive metabolic panel, Lactate dehydrogenase, Ferritin, CEA  Adenocarcinoma of transverse colon ([place]) - Plan: CBC with Differential/Platelet, Comprehensive metabolic panel, Lactate dehydrogenase, Ferritin, CEA  Staging Cancer Staging Malignant neoplasm of transverse colon [facility]) Staging form: Colon and Rectum, AJCC 7th Edition - Pathologic stage from 03/04/2015: Stage IIIC (T4a, N2a, cM0) - Signed by Baird Cancer, PA-C on 03/04/2015     Assessment and Plan:1. Stage IIIC adenocarcinoma of transverse colon.  62 yr old male previously followed by Dr. Talbert Cage.  He was diagnosed in 01/2015 on screening colonoscopy.  CEA elevated at time of diagnosis at 6.6. Treated with definitve surgery with right hemicolectomy by Dr. Adonis Huguenin on 02/12/15. Went on to have adjuvant chemo with FOLFOX x 12 cycles; chemo course was complicated by progressive peripheral neuropathy and thus Oxaliplatin was dose-reduced for cycles #6-11, and then d/c'd for cycle #12.   Pt had colonoscopy in 01/2016 with Dr. Oneida Alar. Path revealed 1 hyperplastic polyp.  No evidence of tubular adenoma.    Pt was recommended for CT imaging but has not had that performed due to inability to get scan approved.  He is complaining of persistent nausea.  He will be set up for CT chest, abdomen and pelvis as his last imaging was in 10/2016 for ongoing assessment due to symptoms.  He will RTC to go over results.   2.  Nausea.  Pt reports this is ongoing.  Last CT scan done 10/2016 was negative for  metastatic disease.  Due to symptoms he will be set up for repeat imaging.  Pending results, he will be referred back to GI.    3.  LUL pulmonary nodule.  Pt had 6 mm nodule on scan done 04/2016.  He will be set up for repeat imaging for ongoing follow-up.    4.  HTN.  BP is 116/72.  Follow-up with PCP.    5.  B12 deficiency.  Continue monthly B12 injections.    Interval History:  From [name], PA-C's last note on 06/14/16)    Current Status:  Pt is seen today for follow-up.  He has not undergone CT scan because he reports it was not approved. He is here to go over labs.  He reports nausea most mornings.    Colon resection surgical path: 02/10/15 SURGICAL PATHOLOGY Surgical Pathology  * THIS IS AN ADDENDUM REPORT *  CASE: [id number]  PATIENT: Erik [name]  Surgical Pathology Report  **Addendum **  Reason for Addendum #1: Immunohistochemistry results   SPECIMEN SUBMITTED:  A. Peritoneal biopsy  B. Extended hemicolectomy, right   CLINICAL HISTORY:  Potential metastatic colon cancer; Tubular adenoma of colon   PRE-OPERATIVE DIAGNOSIS:    POST-OPERATIVE DIAGNOSIS:       DIAGNOSIS:  A. PERITONEUM; BIOPSY:  - FIBROADIPOSE TISSUE WITH INFLAMMATION.   B. RIGHT COLON; EXTENDED RIGHT HEMICOLECTOMY:  - MUCINOUS ADENOCARCINOMA WITH PENETRATION OF THE VISCERAL PERITONEUM.  - SEPARATE TUBULAR ADENOMA OF THE CECUM.  - APPENDIX WITHIN NORMAL LIMITS.  - FOUR OF NINETEEN LYMPH NODES INVOLVED BY METASTATIC CARCINOMA (4/19).  - THE MARGINS OF RESECTION ARE NEGATIVE.  - SEE SUMMARY BELOW.   Note:  Ample material  is available for MSI and other ancillary testing.    COLON AND RECTUM: Resection, Including Transanal Disk Excision of Rectal  Neoplasms  Specimens InvolvedB: Extended hemicolectomy, right   Colon and Rectum, Resection Cancer Case Summary  SPECIMEN  Specimen: Terminal ileum  Cecum  Appendix  Ascending colon  Procedure:   Right hemicolectomy  Primary Tumor  Site: Right (ascending) colon  Additional Sites Involved by Tumor:   None identified  Macroscopic Tumor Perforation:   Cannot be determined  unspecified  TUMOR  Histologic Type:  Mucinous adenocarcinoma  Histologic Grade:  Other (specify)  Defer to optional MSI testing for grade assignment: Mucinous  adenocarcinoma is no longer considered definitionally high grade. MSI-H  carcinomas are low grade while MSS or MSI-L carcinomas are high grade  EXTENT  Tumor Size:  Greatest dimension (cm)  4.8cm  Microscopic Tumor Extension: Tumor penetrates to the surface of the  visceral peritoneum (serosa)  MARGINS  Proximal Margin:  Uninvolved by invasive carcinoma  - No adenoma or intraepithelial neoplasia / dysplasia identified  Distal Margin: Uninvolved by invasive carcinoma  - No adenoma or intraepithelial neoplasia / dysplasia identified  Circumferential (Radial) or Mesenteric Margin:  Uninvolved by invasive  carcinoma  Deep Margin:  n/a  ACCESSORY FINDINGS  Lymph-Vascular Invasion: Present  Perineural Invasion:   Present  Tumor Deposits:   Not identified  not identified  STAGE (pTNM)  TNM Descriptors:  n/a  Primary Tumor (pT): pT4a: Tumor penetrates the visceral peritoneum  Regional Lymph Nodes (pN)  pN2a: Metastasis in 4 to 6 regional lymph nodes  Number of Lymph Nodes Examined:  Specify  19  Number of Lymph Nodes Involved:  Specify  4  Distant Metastasis (pM): Not applicable           Oncology History   Stage IIIC ([id number]) adenocarcinoma of transverse colon, diagnosed on colonoscopy by Dr. Oneida Alar on 01/12/2015 followed by definitive surgery by Dr. [name] with right hemicolectomy on 02/12/2015.  He then underwent FOLFOX x 12 cycles in the adjuvant setting (03/30/2015- 09/06/2015).     Malignant neoplasm of transverse colon ([place])   01/12/2015 Pathologic Stage    Colon, biopsy, distal transverse - TUBULOVILLOUS ADENOMA WITH HIGH GRADE DYSPLASIA.       01/12/2015 Procedure    Colonoscopy by Dr. Oneida Alar.      01/12/2015 Tumor Marker    CEA: 6.6 (H)       01/18/2015 Imaging    CT abd/pelvis- Apple-core lesion identified in the mid transverse colon without obstruction. No evidence for lymphadenopathy in the gastrohepatic ligament or omentum.  Stable 8 mm hypo attenuating lesion in the left liver, likely a cyst.      02/10/2015 Initial Diagnosis    Adenocarcinoma of transverse colon ([place])      02/12/2015 Definitive Surgery    [name], Extended right hemicolectomy       02/12/2015 Pathology Results    Mucinous adenocarcinoma with penetration of visceral peritoneum, 4/19 lymph nodes for metastatic disease, negative resection margins, with LVI and perineural invasion      03/30/2015 - 09/06/2015 Chemotherapy    FOLFOX x 12 cycles      05/25/2015 Treatment Plan Change    5 FU bolus discontinued for cycle #5      06/08/2015 Treatment Plan Change    Treatment deferred x 1 week      06/15/2015 Treatment Plan Change    5FU CI decreased by 10% and Oxaliplatin reduced by 15% for cycles #6-#11; Oxaliplatin dropped  for cycle #12 d/t neuropathy.       10/20/2015 Imaging    CT CAP- Right hemicolectomy without evidence of metastatic disease. 2. Previously measured ground-glass lesion in the left upper lobe has resolved. 3. Probable food debris in the stomach, simulating gastric wall thickening. Please correlate clinically. 4. 6 mm irregular nodular density in the left upper lobe, stable. Continued attention on followup exams is warranted.      01/25/2016 Procedure    Colonoscopy by Dr. Oneida Alar- Non-thrombosed external hemorrhoids found on digital rectal exam. - One 4 mm polyp in the rectum, removed with a cold biopsy forceps. Resected and retrieved. - Congested mucosa in the neo-terminal ileum. Biopsied. - Redundant colon. - Internal hemorrhoids.      01/26/2016 Pathology Results    1. Terminal ileum, biopsy - MILD ACUTE  (ACTIVE) ILEITIS. - NO DYSPLASIA OR MALIGNANCY IDENTIFIED - SEE COMMENT. 2. Rectum, polyp(s) - HYPERPLASTIC POLYP (X 1). - NO DYSPLASIA OR MALIGNANCY IDENTIFIED.      04/13/2016 Imaging    CT chest- Stable CT chest. 6 mm irregular nodule anterior left upper lobe is stable. The scattered areas of peribronchovascular micro nodularity in the lungs bilaterally are unchanged.      10/19/2016 Imaging    CT CAP: 1. Status post right hemicolectomy. No findings to suggest metastatic disease in the abdomen or pelvis. 2. Aortic atherosclerosis. 3. Additional incidental findings, as above. Aortic Atherosclerosis (ICD10-I70.0).        Problem List Patient Active Problem List   Diagnosis Date Noted  . Psoriasis [L40.9] 09/12/2016  . Pulmonary nodule [R91.1] 06/14/2016  . History of colon cancer [Z85.038]   . Diarrhea [R19.7] 01/06/2016  . Cigarette nicotine dependence with nicotine-induced disorder [F17.219] 06/18/2015  . Chronic obstructive pulmonary disease ([place]) [J44.9] 06/18/2015  . Depression [F32.9] 06/18/2015  . B12 deficiency [E53.8] 04/14/2015  . Malignant neoplasm of transverse colon ([place]) [C18.4] 02/10/2015  . GERD (gastroesophageal reflux disease) [K21.9] 12/15/2014  . History of colonic polyps [Z86.010] 12/15/2014  . History of alcohol abuse [Z87.898] 09/16/2014  . Panic disorder [F41.0] 10/10/2012  . Major depressive disorder, single episode [F32.9] 10/10/2012  . Bilateral lower extremity edema [R60.0] 04/24/2012  . Hyperlipidemia [E78.5] 04/21/2009  . CAD, NATIVE VESSEL [I25.10] 03/25/2009  . HEAD TRAUMA [S09.90XA] 03/02/2009    Past Medical History Past Medical History:  Diagnosis Date  . Abnormal stress echocardiogram   . Adenocarcinoma of transverse colon ([place]) 02/10/2015  . Alcohol abuse    Heavy Use up until 2010  . Anxiety   . Blood transfusion without reported diagnosis   . COPD (chronic obstructive pulmonary disease) ([place])   . Depression   . Emphysema  of lung ([place])   . GERD (gastroesophageal reflux disease)   . Head trauma 2001   closed head injury; coma for 4 weeks  . Hypercholesterolemia   . Hypertension   . ING HERN W/GANGREN RECUR UNILAT/UNSPEC ING HERN 04/21/2009   Qualifier: Diagnosis of  By: Verl Blalock, MD, Delanna Ahmadi PTSD (post-traumatic stress disorder)   . Pulmonary nodule 06/14/2016  . SAH (subarachnoid hemorrhage) ([place]) 08/31/2012  . SDH (subdural hematoma) ([place]) 08/31/2012    Past Surgical History Past Surgical History:  Procedure Laterality Date  . BIOPSY  01/25/2016   Procedure: BIOPSY;  Surgeon: Danie Binder, MD;  [facility];  Service: Endoscopy;;  ileum;   . cardiac cath    . COLONOSCOPY  2011   Dr. Oneida Alar: multiple adenomas and hyperplastic polyps  .  COLONOSCOPY WITH PROPOFOL N/A 01/12/2015   Procedure: COLONOSCOPY WITH PROPOFOL;  Surgeon: Danie Binder, MD;  [facility];  Service: Endoscopy;  Laterality: N/A;  1030  . COLONOSCOPY WITH PROPOFOL N/A 01/25/2016   Procedure: COLONOSCOPY WITH PROPOFOL;  Surgeon: Danie Binder, MD;  [facility];  Service: Endoscopy;  Laterality: N/A;  1230  . CRANIOTOMY  2001  . ESOPHAGOGASTRODUODENOSCOPY (EGD) WITH PROPOFOL N/A 01/12/2015   Procedure: ESOPHAGOGASTRODUODENOSCOPY (EGD) WITH PROPOFOL;  Surgeon: Danie Binder, MD;  [facility];  Service: Endoscopy;  Laterality: N/A;  . head injury surgery    . HERNIA REPAIR Right 2012   Inguinal- [place] Na  . KIDNEY SURGERY     >30 years ago  . LAPAROSCOPIC RIGHT HEMI COLECTOMY Left 02/10/2015   Procedure: LAPAROSCOPIC THEN OPEN RIGHT HEMI COLECTOMY;  Surgeon: [name], MD;  Location: [facility];  Service: General;  Laterality: Left;  . POLYPECTOMY  01/25/2016   Procedure: POLYPECTOMY;  Surgeon: Danie Binder, MD;  [facility];  Service: Endoscopy;;  colon  . PORT-A-CATH REMOVAL N/A 02/26/2017   Procedure: MINOR REMOVAL PORT-A-CATH;  Surgeon: Aviva Signs, MD;   Location: [facility];  Service: General;  Laterality: N/A;  Pt to arrive at 8:00am  . PORTACATH PLACEMENT N/A 03/24/2015   Procedure: INSERTION PORT-A-CATH;  Surgeon: Jules Husbands, MD;  Location: [facility];  Service: General;  Laterality: N/A;    Family History Family History  Problem Relation Age of Onset  . Pulmonary embolism Mother   . Breast cancer Mother   . Cancer Mother        breast cancer  . Arthritis Mother   . Heart disease Father   . Cancer Father 72       Leukemia  . Cancer Paternal Grandfather        Lung  . Ataxia Neg Hx   . Chorea Neg Hx   . Dementia Neg Hx   . Mental retardation Neg Hx   . Migraines Neg Hx   . Multiple sclerosis Neg Hx   . Neurofibromatosis Neg Hx   . Neuropathy Neg Hx   . Parkinsonism Neg Hx   . Seizures Neg Hx   . Stroke Neg Hx   . Colon cancer Neg Hx      Social History  reports that he has been smoking cigarettes.  He started smoking about 44 years ago. He has a 3.00 pack-year smoking history. He has never used smokeless tobacco. He reports that he drinks alcohol. He reports that he does not use drugs.  Medications  Current Outpatient Medications:  .  acetaminophen (TYLENOL) 500 MG tablet, Take 1,000 mg by mouth every 6 (six) hours as needed for moderate pain or headache., Disp: , Rfl:  .  amitriptyline (ELAVIL) 25 MG tablet, TAKE 1 TABLET BY MOUTH EVERY NIGHT AT BEDTIME, Disp: 30 tablet, Rfl: 0 .  atorvastatin (LIPITOR) 20 MG tablet, Take 1 tablet (20 mg total) by mouth at bedtime., Disp: 90 tablet, Rfl: 3 .  buPROPion (WELLBUTRIN SR) 150 MG 12 hr tablet, Take 150 mg by mouth 2 (two) times daily., Disp: , Rfl:  .  cyanocobalamin (,VITAMIN B-12,) 1000 MCG/ML injection, Inject 1,000 mcg into the muscle every 30 (thirty) days., Disp: , Rfl:  .  FLUoxetine (PROZAC) 20 MG capsule, TAKE 1 CAPSULE BY MOUTH 2 TIMES A DAY (Patient taking differently: Take 20 mg by mouth 2 (two) times daily. ), Disp: 60 capsule, Rfl: 0 .  gabapentin (NEURONTIN) 300  MG capsule, TAKE 4 CAPSULES BY MOUTH EVERY DAY AT BEDTIME, Disp: 120 capsule, Rfl: 0 .  HYDROcodone-acetaminophen (NORCO) 7.5-325 MG tablet, Take 1 tablet by mouth every 6 (six) hours as needed for moderate pain., Disp: 15 tablet, Rfl: 0 .  ibuprofen (ADVIL,MOTRIN) 200 MG tablet, Take 400 mg by mouth every 6 (six) hours as needed for headache or moderate pain., Disp: , Rfl:  .  loperamide (IMODIUM) 2 MG capsule, TAKE 1 TO 2 CAPSULES BY MOUTH EVERY 6 HOURS AS NEEDED FOR DIARRHEA OR LOOSE STOOLS, Disp: 60 capsule, Rfl: 0 .  omeprazole (PRILOSEC) 20 MG capsule, Take 20 mg by mouth daily., Disp: , Rfl:  .  PROAIR HFA 108 (90 Base) MCG/ACT inhaler, INHALE 2 PUFFS BY MOUTH EVERY 6 HOURS AS NEEDED FOR SHORTNESS OF BREATH/WHEEZING., Disp: 18 each, Rfl: 2  Allergies Patient has no known allergies.  Review of Systems Review of Systems - Oncology ROS negative other than nausea   Physical Exam  Vitals Wt Readings from Last 3 Encounters:  07/31/17 243 lb 4.8 oz (110.4 kg)  03/12/17 234 lb 1.3 oz (106.2 kg)  02/22/17 245 lb (111.1 kg)   Temp Readings from Last 3 Encounters:  07/31/17 (!) 97.3 F (36.3 C) (Oral)  03/12/17 97.6 F (36.4 C) (Temporal)  02/26/17 97.8 F (36.6 C) (Oral)   BP Readings from Last 3 Encounters:  07/31/17 116/72  03/12/17 128/74  02/26/17 139/80   Pulse Readings from Last 3 Encounters:  07/31/17 98  03/12/17 92  02/26/17 70   Constitutional: Well-developed, well-nourished, and in no distress.   HENT: Head: Normocephalic and atraumatic.  Mouth/Throat: No oropharyngeal exudate. Mucosa moist. Eyes: Pupils are equal, round, and reactive to light. Conjunctivae are normal. No scleral icterus.  Neck: Normal range of motion. Neck supple. No JVD present.  Cardiovascular: Normal rate, regular rhythm and normal heart sounds.  Exam reveals no gallop and no friction rub.   No murmur heard. Pulmonary/Chest: Effort normal and breath sounds normal. No respiratory distress.  No wheezes.No rales.  Abdominal: Soft. Bowel sounds are normal. Pt reports some tenderness near surgical site. There is no guarding. Evidence of prior surgery.   Musculoskeletal: No edema or tenderness.  Lymphadenopathy: No cervical, axillaryor supraclavicular adenopathy.  Neurological: Alert and oriented to person, place, and time. No cranial nerve deficit.  Skin: Skin is warm and dry. No rash noted. No erythema. No pallor.  Psychiatric: Affect and judgment normal.   Labs No visits with results within 3 Day(s) from this visit.  Latest known visit with results is:  Appointment on 07/23/2017  Component Date Value Ref Range Status  . WBC 07/23/2017 4.6  4.0 - 10.5 K/uL Final  . RBC 07/23/2017 4.43  4.22 - 5.81 MIL/uL Final  . Hemoglobin 07/23/2017 13.9  13.0 - 17.0 g/dL Final  . HCT 07/23/2017 43.7  39.0 - 52.0 % Final  . MCV 07/23/2017 98.6  78.0 - 100.0 fL Final  . MCH 07/23/2017 31.4  26.0 - 34.0 pg Final  . MCHC 07/23/2017 31.8  30.0 - 36.0 g/dL Final  . RDW 07/23/2017 13.6  11.5 - 15.5 % Final  . Platelets 07/23/2017 208  150 - 400 K/uL Final  . Neutrophils Relative % 07/23/2017 54  % Final  . Neutro Abs 07/23/2017 2.5  1.7 - 7.7 K/uL Final  . Lymphocytes Relative 07/23/2017 32  % Final  . Lymphs Abs 07/23/2017 1.5  0.7 - 4.0 K/uL Final  . Monocytes Relative  07/23/2017 10  % Final  . Monocytes Absolute 07/23/2017 0.4  0.1 - 1.0 K/uL Final  . Eosinophils Relative 07/23/2017 2  % Final  . Eosinophils Absolute 07/23/2017 0.1  0.0 - 0.7 K/uL Final  . Basophils Relative 07/23/2017 2  % Final  . Basophils Absolute 07/23/2017 0.1  0.0 - 0.1 K/uL Final   Performed at [facility], [address], [place], [place] [address]  . Sodium 07/23/2017 141  135 - 145 mmol/L Final  . Potassium 07/23/2017 4.6  3.5 - 5.1 mmol/L Final  . Chloride 07/23/2017 103  101 - 111 mmol/L Final  . CO2 07/23/2017 29  22 - 32 mmol/L Final  . Glucose, Bld 07/23/2017 89  65 - 99 mg/dL Final  . BUN 07/23/2017 14   6 - 20 mg/dL Final  . Creatinine, Ser 07/23/2017 0.95  0.61 - 1.24 mg/dL Final  . Calcium 07/23/2017 9.0  8.9 - 10.3 mg/dL Final  . Total Protein 07/23/2017 7.3  6.5 - 8.1 g/dL Final  . Albumin 07/23/2017 3.8  3.5 - 5.0 g/dL Final  . AST 07/23/2017 21  15 - 41 U/L Final  . ALT 07/23/2017 18  17 - 63 U/L Final  . Alkaline Phosphatase 07/23/2017 64  38 - 126 U/L Final  . Total Bilirubin 07/23/2017 0.6  0.3 - 1.2 mg/dL Final  . GFR calc non Af Amer 07/23/2017 >60  >60 mL/min Final  . GFR calc Af Amer 07/23/2017 >60  >60 mL/min Final   Comment: (NOTE) The eGFR has been calculated using the CKD EPI equation. This calculation has not been validated in all clinical situations. eGFR's persistently <60 mL/min signify possible Chronic Kidney Disease.   Georgiann Hahn gap 07/23/2017 9  5 - 15 Final   Performed at [facility], [address], [place], [place] [address]  . CEA 07/23/2017 3.5  0.0 - 4.7 ng/mL Final   Comment: (NOTE)                             Nonsmokers          <3.9                             Smokers             <5.6 [employer] Electrochemiluminescence Immunoassay (ECLIA) Values obtained with different assay methods or kits cannot be used interchangeably.  Results cannot be interpreted as absolute evidence of the presence or absence of malignant disease. Performed At: [facility] [place], [place] [id number] Rush Farmer MD [id number] Performed at [facility], [address] [place], [place] [address]   . Vitamin B-12 07/23/2017 151* 180 - 914 pg/mL Final   Comment: (NOTE) This assay is not validated for testing neonatal or myeloproliferative syndrome specimens for Vitamin B12 levels. Performed at [facility], [place] [address] [place], [place] [address]      Pathology Orders Placed This Encounter  Procedures  . CT CHEST W CONTRAST    Standing Status:   Future    Standing Expiration Date:   07/31/2018    Order Specific Question:    If indicated for the ordered procedure, I authorize the administration of contrast media per Radiology protocol    Answer:   Yes    Order Specific Question:   Preferred imaging location?    Answer:   Deneise Lever  [facility]    Order Specific Question:   Radiology Contrast Protocol - do NOT remove file path    Answer:   [email or web address]  . CT Abdomen Pelvis W Contrast    Standing Status:   Future    Standing Expiration Date:   07/31/2018    Order Specific Question:   If indicated for the ordered procedure, I authorize the administration of contrast media per Radiology protocol    Answer:   Yes    Order Specific Question:   Preferred imaging location?    Answer:   [facility]    Order Specific Question:   Is Oral Contrast requested for this exam?    Answer:   Yes, Per Radiology protocol    Order Specific Question:   Radiology Contrast Protocol - do NOT remove file path    Answer:   \\[email or web address]  . CBC with Differential/Platelet    Standing Status:   Future    Standing Expiration Date:   08/01/2018  . Comprehensive metabolic panel    Standing Status:   Future    Standing Expiration Date:   08/01/2018  . Lactate dehydrogenase    Standing Status:   Future    Standing Expiration Date:   08/01/2018  . Ferritin    Standing Status:   Future    Standing Expiration Date:   08/01/2018  . CEA    Standing Status:   Future    Standing Expiration Date:   08/01/2018       Zoila Shutter MD

## 2017-08-16 ENCOUNTER — Other Ambulatory Visit: Payer: Self-pay | Admitting: Family Medicine

## 2017-08-16 ENCOUNTER — Other Ambulatory Visit (HOSPITAL_COMMUNITY): Payer: Self-pay | Admitting: Hematology

## 2017-08-16 DIAGNOSIS — G620 Drug-induced polyneuropathy: Secondary | ICD-10-CM

## 2017-08-16 DIAGNOSIS — C184 Malignant neoplasm of transverse colon: Secondary | ICD-10-CM

## 2017-08-16 DIAGNOSIS — T451X5A Adverse effect of antineoplastic and immunosuppressive drugs, initial encounter: Secondary | ICD-10-CM

## 2017-08-20 ENCOUNTER — Ambulatory Visit (HOSPITAL_COMMUNITY): Payer: Medicaid Other

## 2017-08-22 ENCOUNTER — Ambulatory Visit (HOSPITAL_COMMUNITY): Payer: Medicaid Other | Admitting: Internal Medicine

## 2017-09-10 ENCOUNTER — Other Ambulatory Visit (HOSPITAL_COMMUNITY): Payer: Self-pay | Admitting: *Deleted

## 2017-09-10 DIAGNOSIS — G620 Drug-induced polyneuropathy: Secondary | ICD-10-CM

## 2017-09-10 DIAGNOSIS — C184 Malignant neoplasm of transverse colon: Secondary | ICD-10-CM

## 2017-09-10 DIAGNOSIS — T451X5A Adverse effect of antineoplastic and immunosuppressive drugs, initial encounter: Principal | ICD-10-CM

## 2017-09-14 ENCOUNTER — Other Ambulatory Visit: Payer: Self-pay | Admitting: Family Medicine

## 2017-09-14 ENCOUNTER — Other Ambulatory Visit (HOSPITAL_COMMUNITY): Payer: Self-pay | Admitting: Hematology

## 2017-09-14 DIAGNOSIS — T451X5A Adverse effect of antineoplastic and immunosuppressive drugs, initial encounter: Principal | ICD-10-CM

## 2017-09-14 DIAGNOSIS — G620 Drug-induced polyneuropathy: Secondary | ICD-10-CM

## 2017-09-14 DIAGNOSIS — C184 Malignant neoplasm of transverse colon: Secondary | ICD-10-CM

## 2017-10-10 ENCOUNTER — Other Ambulatory Visit: Payer: Self-pay

## 2017-10-10 ENCOUNTER — Emergency Department (HOSPITAL_COMMUNITY)
Admission: EM | Admit: 2017-10-10 | Discharge: 2017-10-10 | Disposition: A | Discharge status: Home / Self Care | Payer: Medicaid Other | Attending: Emergency Medicine | Admitting: Emergency Medicine

## 2017-10-10 ENCOUNTER — Emergency Department (HOSPITAL_COMMUNITY): Payer: Medicaid Other

## 2017-10-10 ENCOUNTER — Encounter (HOSPITAL_COMMUNITY): Payer: Self-pay | Admitting: *Deleted

## 2017-10-10 DIAGNOSIS — E7800 Pure hypercholesterolemia, unspecified: Secondary | ICD-10-CM | POA: Insufficient documentation

## 2017-10-10 DIAGNOSIS — F17210 Nicotine dependence, cigarettes, uncomplicated: Secondary | ICD-10-CM | POA: Diagnosis not present

## 2017-10-10 DIAGNOSIS — R42 Dizziness and giddiness: Secondary | ICD-10-CM | POA: Diagnosis present

## 2017-10-10 DIAGNOSIS — J449 Chronic obstructive pulmonary disease, unspecified: Secondary | ICD-10-CM | POA: Insufficient documentation

## 2017-10-10 DIAGNOSIS — I10 Essential (primary) hypertension: Secondary | ICD-10-CM | POA: Diagnosis not present

## 2017-10-10 DIAGNOSIS — Z79899 Other long term (current) drug therapy: Secondary | ICD-10-CM | POA: Diagnosis not present

## 2017-10-10 LAB — COMPREHENSIVE METABOLIC PANEL
ALK PHOS: 103 U/L (ref 38–126)
ALT: 32 U/L (ref 0–44)
AST: 26 U/L (ref 15–41)
Albumin: 3.8 g/dL (ref 3.5–5.0)
Anion gap: 10 (ref 5–15)
BUN: 12 mg/dL (ref 6–20)
CALCIUM: 9.1 mg/dL (ref 8.9–10.3)
CO2: 28 mmol/L (ref 22–32)
CREATININE: 0.75 mg/dL (ref 0.61–1.24)
Chloride: 100 mmol/L (ref 98–111)
Glucose, Bld: 103 mg/dL — ABNORMAL HIGH (ref 70–99)
Potassium: 4.2 mmol/L (ref 3.5–5.1)
Sodium: 138 mmol/L (ref 135–145)
Total Bilirubin: 0.7 mg/dL (ref 0.3–1.2)
Total Protein: 7.5 g/dL (ref 6.5–8.1)

## 2017-10-10 LAB — TROPONIN I

## 2017-10-10 LAB — CBC WITH DIFFERENTIAL/PLATELET
Basophils Absolute: 0.1 10*3/uL (ref 0.0–0.1)
Basophils Relative: 1 %
EOS ABS: 0.1 10*3/uL (ref 0.0–0.7)
EOS PCT: 1 %
HCT: 42 % (ref 39.0–52.0)
Hemoglobin: 14.2 g/dL (ref 13.0–17.0)
LYMPHS ABS: 1.4 10*3/uL (ref 0.7–4.0)
Lymphocytes Relative: 16 %
MCH: 32.4 pg (ref 26.0–34.0)
MCHC: 33.8 g/dL (ref 30.0–36.0)
MCV: 95.9 fL (ref 78.0–100.0)
MONO ABS: 0.5 10*3/uL (ref 0.1–1.0)
Monocytes Relative: 6 %
Neutro Abs: 6.5 10*3/uL (ref 1.7–7.7)
Neutrophils Relative %: 76 %
PLATELETS: 256 10*3/uL (ref 150–400)
RBC: 4.38 MIL/uL (ref 4.22–5.81)
RDW: 13.6 % (ref 11.5–15.5)
WBC: 8.6 10*3/uL (ref 4.0–10.5)

## 2017-10-10 LAB — CBG MONITORING, ED: Glucose-Capillary: 97 mg/dL (ref 70–99)

## 2017-10-10 MED ORDER — SODIUM CHLORIDE 0.9 % IV SOLN
INTRAVENOUS | Status: DC
  Administered 2017-10-10: 16:00:00 via INTRAVENOUS

## 2017-10-10 NOTE — ED Notes (Signed)
Patient transported to X-ray 

## 2017-10-10 NOTE — Discharge Instructions (Addendum)
As discussed, your evaluation today has been largely reassuring.  But, it is important that you monitor your condition carefully, and do not hesitate to return to the ED if you develop new, or concerning changes in your condition. ? ?Otherwise, please follow-up with your physician for appropriate ongoing care. ? ?

## 2017-10-10 NOTE — ED Triage Notes (Signed)
Pt c/o dizziness with blurry vision x one day

## 2017-10-10 NOTE — ED Provider Notes (Signed)
[facility] EMERGENCY DEPARTMENT Provider Note   CSN: [id number] Arrival date & time: 10/10/17  1236     History   Chief Complaint Chief Complaint  Patient presents with  . Dizziness    HPI Erik SCHOONMAKER is a 62 y.o. male.  HPI Patient presents with concern of dizziness and unsteadiness. He notes a history of multiple medical issues including prior TBI, requiring bur hole, as well as colon cancer, seemingly now in remission. Previously the patient has had similar episodes, but today's episode is longer lasting, and more severe than usual. No clear precipitant, with no recent medication changes, diet changes, activity changes He continues to drink alcohol, though less than he used to do. Today, soon after awakening he felt generally poor, with unsteadiness on his feet, lightheadedness, without focal weakness, without confusion, without vision changes, without vomiting. There is nausea and generalized discomfort. Since onset symptoms been persistent, worse with activity, no other clear alleviating or exacerbating factors. Past Medical History:  Diagnosis Date  . Abnormal stress echocardiogram   . Adenocarcinoma of transverse colon ([place]) 02/10/2015  . Alcohol abuse    Heavy Use up until 2010  . Anxiety   . Blood transfusion without reported diagnosis   . COPD (chronic obstructive pulmonary disease) ([place])   . Depression   . Emphysema of lung ([place])   . GERD (gastroesophageal reflux disease)   . Head trauma 2001   closed head injury; coma for 4 weeks  . Hypercholesterolemia   . Hypertension   . ING HERN W/GANGREN RECUR UNILAT/UNSPEC ING HERN 04/21/2009   Qualifier: Diagnosis of  By: Verl Blalock, MD, Delanna Ahmadi PTSD (post-traumatic stress disorder)   . Pulmonary nodule 06/14/2016  . SAH (subarachnoid hemorrhage) ([place]) 08/31/2012  . SDH (subdural hematoma) ([place]) 08/31/2012    Patient Active Problem List   Diagnosis Date Noted  . Psoriasis 09/12/2016  . Pulmonary nodule  06/14/2016  . History of colon cancer   . Diarrhea 01/06/2016  . Cigarette nicotine dependence with nicotine-induced disorder 06/18/2015  . Chronic obstructive pulmonary disease ([place]) 06/18/2015  . Depression 06/18/2015  . B12 deficiency 04/14/2015  . Malignant neoplasm of transverse colon ([place]) 02/10/2015  . GERD (gastroesophageal reflux disease) 12/15/2014  . History of colonic polyps 12/15/2014  . History of alcohol abuse 09/16/2014  . Panic disorder 10/10/2012  . Major depressive disorder, single episode 10/10/2012  . Bilateral lower extremity edema 04/24/2012  . Hyperlipidemia 04/21/2009  . CAD, NATIVE VESSEL 03/25/2009  . HEAD TRAUMA 03/02/2009    Past Surgical History:  Procedure Laterality Date  . BIOPSY  01/25/2016   Procedure: BIOPSY;  Surgeon: Danie Binder, MD;  [facility];  Service: Endoscopy;;  ileum;   . cardiac cath    . COLONOSCOPY  2011   Dr. Oneida Alar: multiple adenomas and hyperplastic polyps  . COLONOSCOPY WITH PROPOFOL N/A 01/12/2015   Procedure: COLONOSCOPY WITH PROPOFOL;  Surgeon: Danie Binder, MD;  Location: [facility];  Service: Endoscopy;  Laterality: N/A;  1030  . COLONOSCOPY WITH PROPOFOL N/A 01/25/2016   Procedure: COLONOSCOPY WITH PROPOFOL;  Surgeon: Danie Binder, MD;  Location: [facility];  Service: Endoscopy;  Laterality: N/A;  1230  . CRANIOTOMY  2001  . ESOPHAGOGASTRODUODENOSCOPY (EGD) WITH PROPOFOL N/A 01/12/2015   Procedure: ESOPHAGOGASTRODUODENOSCOPY (EGD) WITH PROPOFOL;  Surgeon: Danie Binder, MD;  Location: [facility];  Service: Endoscopy;  Laterality: N/A;  . head injury surgery    . HERNIA  REPAIR Right 2012   Inguinal- [place] Na  . KIDNEY SURGERY     >30 years ago  . LAPAROSCOPIC RIGHT HEMI COLECTOMY Left 02/10/2015   Procedure: LAPAROSCOPIC THEN OPEN RIGHT HEMI COLECTOMY;  Surgeon: [name], MD;  Location: [facility] OR[facility];  Service: General;  Laterality: Left;  . POLYPECTOMY  01/25/2016   Procedure:  POLYPECTOMY;  Surgeon: Danie Binder, MD;  Location: [facility];  Service: Endoscopy;;  colon  . PORT-A-CATH REMOVAL N/A 02/26/2017   Procedure: MINOR REMOVAL PORT-A-CATH;  Surgeon: Aviva Signs, MD;  Location: [facility];  Service: General;  Laterality: N/A;  Pt to arrive at [place] N/A 03/24/2015   Procedure: INSERTION PORT-A-CATH;  Surgeon: Jules Husbands, MD;  Location: [facility] OR[facility];  Service: General;  Laterality: N/A;        Home Medications    Prior to Admission medications   Medication Sig Start Date End Date Taking? Authorizing Provider  amitriptyline (ELAVIL) 25 MG tablet TAKE 1 TABLET BY MOUTH EVERY NIGHT AT BEDTIME Patient taking differently: Take 25 mg by mouth at bedtime.  06/22/17  Yes Derek Jack, MD  atorvastatin (LIPITOR) 20 MG tablet Take 1 tablet (20 mg total) by mouth at bedtime. 01/02/17  Yes Raylene Everts, MD  buPROPion [facility] SR) 150 MG 12 hr tablet Take 150 mg by mouth daily.    Yes [provider]  FLUoxetine (PROZAC) 20 MG capsule TAKE 1 CAPSULE BY MOUTH 2 TIMES A DAY Patient taking differently: Take 40 mg by mouth every morning.  06/14/16  Yes [name], PA-C  gabapentin (NEURONTIN) 300 MG capsule TAKE 4 CAPSULES BY MOUTH EVERY DAY AT BEDTIME Patient taking differently: Take 300-900 mg by mouth See admin instructions. 300mg  in the morning and 900mg  at bedtime 06/22/17  Yes Derek Jack, MD  loperamide (IMODIUM) 2 MG capsule TAKE 1 TO 2 CAPSULES BY MOUTH EVERY 6 HOURS AS NEEDED FOR DIARRHEA OR LOOSE STOOLS Patient taking differently: Take 2-4 mg by mouth 2 (two) times daily.  06/22/17  Yes Derek Jack, MD  omeprazole (PRILOSEC) 20 MG capsule Take 20 mg by mouth daily.   Yes [provider]  PROAIR HFA 108 (90 Base) MCG/ACT inhaler INHALE 2 PUFFS BY MOUTH EVERY 6 HOURS AS NEEDED FOR SHORTNESS OF BREATH/WHEEZING. Patient taking differently: Inhale 2 puffs into the lungs every 6 (six) hours as  needed for wheezing or shortness of breath.  02/13/17  Yes Raylene Everts, MD    Family History Family History  Problem Relation Age of Onset  . Pulmonary embolism Mother   . Breast cancer Mother   . Cancer Mother        breast cancer  . Arthritis Mother   . Heart disease Father   . Cancer Father 40       Leukemia  . Cancer Paternal Grandfather        Lung  . Ataxia Neg Hx   . Chorea Neg Hx   . Dementia Neg Hx   . Mental retardation Neg Hx   . Migraines Neg Hx   . Multiple sclerosis Neg Hx   . Neurofibromatosis Neg Hx   . Neuropathy Neg Hx   . Parkinsonism Neg Hx   . Seizures Neg Hx   . Stroke Neg Hx   . Colon cancer Neg Hx     Social History Social History   Tobacco Use  . Smoking status: Current Some Day Smoker    Packs/day: 0.10  Years: 30.00    Pack years: 3.00    Types: Cigarettes    Start date: 02/06/1973  . Smokeless tobacco: Never Used  . Tobacco comment: smokes every 2-3 days, 2-3 a day when smokes  Substance Use Topics  . Alcohol use: Yes    Alcohol/week: 0.0 standard drinks    Comment: beer occ, history of ETOH abuse in remote past.   . Drug use: No    Types: Marijuana    Comment: marijuana last 2012     Allergies   Patient has no known allergies.   Review of Systems Review of Systems  Constitutional:       Per HPI, otherwise negative  HENT:       Per HPI, otherwise negative  Respiratory:       Per HPI, otherwise negative  Cardiovascular:       Per HPI, otherwise negative  Gastrointestinal: Positive for nausea. Negative for vomiting.  Endocrine:       Negative aside from HPI  Genitourinary:       Neg aside from HPI   Musculoskeletal:       Per HPI, otherwise negative  Skin: Negative.   Neurological: Positive for dizziness and light-headedness. Negative for syncope.     Physical Exam Updated Vital Signs BP (!) 189/95   Pulse 90   Temp (!) 96.8 F (36 C) (Temporal)   Resp 16   Wt 108.9 kg   SpO2 95%   BMI 30.81 kg/m    Physical Exam  Constitutional: He is oriented to person, place, and time. He appears well-developed. No distress.  HENT:  Head: Normocephalic and atraumatic.  Eyes: Conjunctivae and EOM are normal.  Cardiovascular: Normal rate and regular rhythm.  Pulmonary/Chest: Effort normal. No stridor. No respiratory distress.  Abdominal: He exhibits no distension.  Musculoskeletal: He exhibits no edema.  Neurological: He is alert and oriented to person, place, and time. He displays no atrophy and no tremor. No cranial nerve deficit. He exhibits normal muscle tone. He displays no seizure activity. Coordination normal.  Skin: Skin is warm and dry.  Psychiatric: He has a normal mood and affect.  Nursing note and vitals reviewed.    ED Treatments / Results  Labs (all labs ordered are listed, but only abnormal results are displayed) Labs Reviewed  COMPREHENSIVE METABOLIC PANEL - Abnormal; Notable for the following components:      Result Value   Glucose, Bld 103 (*)    All other components within normal limits  TROPONIN I  CBC WITH DIFFERENTIAL/PLATELET  CBG MONITORING, ED    EKG EKG Interpretation  Date/Time:  Wednesday October 10 2017 12:51:31 EDT Ventricular Rate:  99 PR Interval:  176 QRS Duration: 98 QT Interval:  360 QTC Calculation: 462 R Axis:   -60 Text Interpretation:  Normal sinus rhythm Incomplete right bundle branch block Left anterior fascicular block T wave abnormality Abnormal ekg Confirmed by Carmin Muskrat [phone]) on 10/10/2017 3:38:27 PM   Radiology Dg Chest 2 View  Result Date: 10/10/2017 CLINICAL DATA:  Near-syncope. EXAM: CHEST - 2 VIEW COMPARISON:  Chest x-ray dated March 24, 2015. FINDINGS: Interval removal of the right chest wall port catheter. The heart size and mediastinal contours are within normal limits. Normal pulmonary vascularity. No focal consolidation, pleural effusion, or pneumothorax. No acute osseous abnormality. IMPRESSION: No active  cardiopulmonary disease. Electronically Signed   By: Titus Dubin M.D.   On: 10/10/2017 16:02    Procedures Procedures (including critical care time)  Medications  Ordered in ED Medications  0.9 %  sodium chloride infusion ( Intravenous New Bag/Given 10/10/17 1539)     Initial Impression / Assessment and Plan / ED Course  I have reviewed the triage vital signs and the nursing notes.  Pertinent labs & imaging results that were available during my care of the patient were reviewed by me and considered in my medical decision making (see chart for details).    Chart review after the initial evaluation notable for cancer history, CT scan 3 months ago that was generally reassuring.   7:23 PM In no distress, awake, alert. He does have some mild hypertension, we discussed this he notes that he has had labile blood pressure for a long time, has not started medication, on the recommendation of his physician due to the labile nature. Here there is no evidence for endorgan effects, though he does have some elevated numbers. Patient has improved clinically, and subjectively states that he feels better. Some suspicion for acute on chronic processes given the absence of persistent neurologic deficits, reassuring labs, generally reassuring vital signs. Absent focal neurologic deficit, emergent imaging of the brain not indicated. No evidence for infectious or metabolic etiology either. Given his improvement here he was discharged with outpatient follow-up.  Final Clinical Impressions(s) / ED Diagnoses  Dizziness   Carmin Muskrat, MD 10/10/17 [phone]

## 2017-10-12 ENCOUNTER — Other Ambulatory Visit (HOSPITAL_COMMUNITY): Payer: Self-pay | Admitting: Hematology

## 2017-10-12 DIAGNOSIS — G620 Drug-induced polyneuropathy: Secondary | ICD-10-CM

## 2017-10-12 DIAGNOSIS — T451X5A Adverse effect of antineoplastic and immunosuppressive drugs, initial encounter: Principal | ICD-10-CM

## 2017-10-12 DIAGNOSIS — C184 Malignant neoplasm of transverse colon: Secondary | ICD-10-CM

## 2017-10-22 ENCOUNTER — Inpatient Hospital Stay (HOSPITAL_COMMUNITY): Payer: Medicaid Other | Attending: Hematology

## 2017-10-22 DIAGNOSIS — R918 Other nonspecific abnormal finding of lung field: Secondary | ICD-10-CM

## 2017-10-22 DIAGNOSIS — G43A0 Cyclical vomiting, not intractable: Secondary | ICD-10-CM

## 2017-10-22 DIAGNOSIS — Z800 Family history of malignant neoplasm of digestive organs: Secondary | ICD-10-CM | POA: Diagnosis not present

## 2017-10-22 DIAGNOSIS — F17210 Nicotine dependence, cigarettes, uncomplicated: Secondary | ICD-10-CM | POA: Diagnosis not present

## 2017-10-22 DIAGNOSIS — R911 Solitary pulmonary nodule: Secondary | ICD-10-CM | POA: Diagnosis not present

## 2017-10-22 DIAGNOSIS — I10 Essential (primary) hypertension: Secondary | ICD-10-CM | POA: Diagnosis not present

## 2017-10-22 DIAGNOSIS — G620 Drug-induced polyneuropathy: Secondary | ICD-10-CM | POA: Diagnosis not present

## 2017-10-22 DIAGNOSIS — C184 Malignant neoplasm of transverse colon: Secondary | ICD-10-CM

## 2017-10-22 DIAGNOSIS — T451X5A Adverse effect of antineoplastic and immunosuppressive drugs, initial encounter: Secondary | ICD-10-CM | POA: Diagnosis not present

## 2017-10-22 DIAGNOSIS — Z85038 Personal history of other malignant neoplasm of large intestine: Secondary | ICD-10-CM | POA: Diagnosis present

## 2017-10-22 DIAGNOSIS — Z9221 Personal history of antineoplastic chemotherapy: Secondary | ICD-10-CM | POA: Diagnosis not present

## 2017-10-22 DIAGNOSIS — R109 Unspecified abdominal pain: Secondary | ICD-10-CM | POA: Diagnosis not present

## 2017-10-22 LAB — CBC WITH DIFFERENTIAL/PLATELET
BASOS ABS: 0.1 10*3/uL (ref 0.0–0.1)
BASOS PCT: 1 %
EOS ABS: 0.2 10*3/uL (ref 0.0–0.7)
Eosinophils Relative: 2 %
HCT: 42.3 % (ref 39.0–52.0)
Hemoglobin: 14.1 g/dL (ref 13.0–17.0)
Lymphocytes Relative: 18 %
Lymphs Abs: 1.5 10*3/uL (ref 0.7–4.0)
MCH: 32.3 pg (ref 26.0–34.0)
MCHC: 33.3 g/dL (ref 30.0–36.0)
MCV: 96.8 fL (ref 78.0–100.0)
MONO ABS: 0.6 10*3/uL (ref 0.1–1.0)
MONOS PCT: 8 %
NEUTROS ABS: 5.9 10*3/uL (ref 1.7–7.7)
NEUTROS PCT: 71 %
Platelets: 185 10*3/uL (ref 150–400)
RBC: 4.37 MIL/uL (ref 4.22–5.81)
RDW: 13.3 % (ref 11.5–15.5)
WBC: 8.3 10*3/uL (ref 4.0–10.5)

## 2017-10-22 LAB — COMPREHENSIVE METABOLIC PANEL
ALBUMIN: 3.7 g/dL (ref 3.5–5.0)
ALT: 13 U/L (ref 0–44)
ANION GAP: 8 (ref 5–15)
AST: 16 U/L (ref 15–41)
Alkaline Phosphatase: 61 U/L (ref 38–126)
BUN: 10 mg/dL (ref 6–20)
CO2: 30 mmol/L (ref 22–32)
Calcium: 9.1 mg/dL (ref 8.9–10.3)
Chloride: 100 mmol/L (ref 98–111)
Creatinine, Ser: 0.88 mg/dL (ref 0.61–1.24)
GFR calc Af Amer: >60 mL/min (ref >60)
GFR calc non Af Amer: >60 mL/min (ref >60)
GLUCOSE: 103 mg/dL — AB (ref 70–99)
POTASSIUM: 3.9 mmol/L (ref 3.5–5.1)
SODIUM: 138 mmol/L (ref 135–145)
TOTAL PROTEIN: 7.2 g/dL (ref 6.5–8.1)
Total Bilirubin: 0.7 mg/dL (ref 0.3–1.2)

## 2017-10-22 LAB — FERRITIN: Ferritin: 92 ng/mL (ref 24–336)

## 2017-10-22 LAB — LACTATE DEHYDROGENASE: LDH: 120 U/L (ref 98–192)

## 2017-10-23 ENCOUNTER — Ambulatory Visit (HOSPITAL_COMMUNITY)
Admission: RE | Admit: 2017-10-23 | Discharge: 2017-10-23 | Disposition: A | Discharge status: Home / Self Care | Payer: Medicaid Other | Source: Ambulatory Visit | Attending: Internal Medicine | Admitting: Internal Medicine

## 2017-10-23 DIAGNOSIS — N400 Enlarged prostate without lower urinary tract symptoms: Secondary | ICD-10-CM | POA: Insufficient documentation

## 2017-10-23 DIAGNOSIS — I2510 Atherosclerotic heart disease of native coronary artery without angina pectoris: Secondary | ICD-10-CM | POA: Diagnosis not present

## 2017-10-23 DIAGNOSIS — I700 Atherosclerosis of aorta: Secondary | ICD-10-CM | POA: Diagnosis not present

## 2017-10-23 DIAGNOSIS — R188 Other ascites: Secondary | ICD-10-CM | POA: Insufficient documentation

## 2017-10-23 DIAGNOSIS — G43A0 Cyclical vomiting, not intractable: Secondary | ICD-10-CM | POA: Insufficient documentation

## 2017-10-23 DIAGNOSIS — R918 Other nonspecific abnormal finding of lung field: Secondary | ICD-10-CM | POA: Diagnosis present

## 2017-10-23 DIAGNOSIS — J439 Emphysema, unspecified: Secondary | ICD-10-CM | POA: Insufficient documentation

## 2017-10-23 DIAGNOSIS — N200 Calculus of kidney: Secondary | ICD-10-CM | POA: Diagnosis not present

## 2017-10-23 DIAGNOSIS — Z9049 Acquired absence of other specified parts of digestive tract: Secondary | ICD-10-CM | POA: Insufficient documentation

## 2017-10-23 LAB — CEA: CEA: 9.1 ng/mL — ABNORMAL HIGH (ref 0.0–4.7)

## 2017-10-23 MED ORDER — IOPAMIDOL (ISOVUE-300) INJECTION 61%
100.0000 mL | Freq: Once | INTRAVENOUS | Status: AC | PRN
  Administered 2017-10-23: 100 mL via INTRAVENOUS

## 2017-10-25 ENCOUNTER — Inpatient Hospital Stay (HOSPITAL_BASED_OUTPATIENT_CLINIC_OR_DEPARTMENT_OTHER): Payer: Medicaid Other | Admitting: Internal Medicine

## 2017-10-25 ENCOUNTER — Other Ambulatory Visit: Payer: Self-pay

## 2017-10-25 ENCOUNTER — Encounter (HOSPITAL_COMMUNITY): Payer: Self-pay | Admitting: Internal Medicine

## 2017-10-25 ENCOUNTER — Other Ambulatory Visit (HOSPITAL_COMMUNITY): Payer: Self-pay | Admitting: Oncology

## 2017-10-25 VITALS — BP 135/82 | HR 105 | Temp 98.1°F | Resp 18 | Wt 241.1 lb

## 2017-10-25 DIAGNOSIS — T451X5A Adverse effect of antineoplastic and immunosuppressive drugs, initial encounter: Principal | ICD-10-CM

## 2017-10-25 DIAGNOSIS — Z800 Family history of malignant neoplasm of digestive organs: Secondary | ICD-10-CM

## 2017-10-25 DIAGNOSIS — R911 Solitary pulmonary nodule: Secondary | ICD-10-CM | POA: Diagnosis not present

## 2017-10-25 DIAGNOSIS — Z85038 Personal history of other malignant neoplasm of large intestine: Secondary | ICD-10-CM | POA: Diagnosis not present

## 2017-10-25 DIAGNOSIS — G620 Drug-induced polyneuropathy: Secondary | ICD-10-CM

## 2017-10-25 DIAGNOSIS — R935 Abnormal findings on diagnostic imaging of other abdominal regions, including retroperitoneum: Secondary | ICD-10-CM

## 2017-10-25 DIAGNOSIS — F17210 Nicotine dependence, cigarettes, uncomplicated: Secondary | ICD-10-CM

## 2017-10-25 DIAGNOSIS — R109 Unspecified abdominal pain: Secondary | ICD-10-CM | POA: Diagnosis not present

## 2017-10-25 DIAGNOSIS — I10 Essential (primary) hypertension: Secondary | ICD-10-CM

## 2017-10-25 DIAGNOSIS — Z9221 Personal history of antineoplastic chemotherapy: Secondary | ICD-10-CM

## 2017-10-25 MED ORDER — GABAPENTIN 300 MG PO CAPS: 300.0000 mg | ORAL_CAPSULE | ORAL | 1 refills | Status: DC

## 2017-10-25 NOTE — Progress Notes (Signed)
Diagnosis Neuropathy due to chemotherapeutic drug ([place]) - Plan: gabapentin (NEURONTIN) 300 MG capsule  Staging Cancer Staging Malignant neoplasm of transverse colon [facility]) Staging form: Colon and Rectum, AJCC 7th Edition - Pathologic stage from 03/04/2015: Stage IIIC (T4a, N2a, cM0) - Signed by Baird Cancer, PA-C on 03/04/2015   Assessment and Plan:  1. Stage IIIC adenocarcinoma of transverse colon.  62 yr old male previously followed by Dr. Talbert Cage.  He was diagnosed in 01/2015 on screening colonoscopy.  CEA elevated at time of diagnosis at 6.6. Treated with definitve surgery with right hemicolectomy by Dr. Adonis Huguenin on 02/12/15. Went on to have adjuvant chemo with FOLFOX x 12 cycles; chemo course was complicated by progressive peripheral neuropathy and thus Oxaliplatin was dose-reduced for cycles #6-11, and then d/c'd for cycle #12.   Pt had colonoscopy in 01/2016 with Dr. Oneida Alar. Path revealed 1 hyperplastic polyp.  No evidence of malignancy.    Pt was recommended for CT imaging but has not had that performed due to inability to get scan approved.  He is complaining of persistent nausea.    CT chest, abdomen and pelvis was recommended  as his last imaging was in 10/2016 for ongoing assessment due to symptoms.    Pt had CT CAP done 10/23/2017 that was reviewed and showed  IMPRESSION: 1. Subtle but new nodularity along the upper omentum along with nodular density in the pelvic mesentery adjacent to the sigmoid colon, concerning for peritoneal and omental implants of tumor. 2. Mild wall thickening in the sigmoid colon could reflect a low-grade colitis. 3. Trace perihepatic ascites. 4. Other imaging findings of potential clinical significance: Aortic Atherosclerosis (ICD10-I70.0) and Emphysema (ICD10-J43.9). Coronary atherosclerosis. Airway thickening is present, suggesting bronchitis or reactive airways disease. Degenerative right glenohumeral arthropathy with free osteochondral fragment in  the subscapular recess. Nonobstructive single bilateral punctate renal calculi. Prior right hemicolectomy. Mild prostatomegaly. Mild impingement at L4-5 and L5-S1.  Labs done 10/22/2017 reviewed and showed WBC 8.3 HB 14.1 and pts 185,000.  Chemistries WNL with K+ 3.9 Cr 0.8 normal LFTs.  Cea Increased at 9.1.  Scan discussed with Dr. Oneida Alar and IR.  Dr. Markus Daft who reviewed CT scan and pt will be set up for CT biopsy of omental lesions.  He will RTC to go over results of biopsy.    2.  LUL pulmonary nodule.  Pt had 6 mm nodule on scan done 04/2016.  CT chest done 10/23/2017 showed stable nodule.    3.  HTN.  BP is 135/82.  Follow-up with PCP.   4.  Smoking.  Cessation is recommended.    30 minutes spent with more than 50% spent in counseling and coordination of care.    Interval History:  From [name], PA-C's last note on 06/14/16)    Current Status:  Pt is seen today for follow-up.  He is here to go over scans and labs.  He reports some abdominal pain.    Oncology History   Stage IIIC [facility]) adenocarcinoma of transverse colon, diagnosed on colonoscopy by Dr. Oneida Alar on 01/12/2015 followed by definitive surgery by Dr. [name] with right hemicolectomy on 02/12/2015.  He then underwent FOLFOX x 12 cycles in the adjuvant setting (03/30/2015- 09/06/2015).     Malignant neoplasm of transverse colon ([place])   01/12/2015 Pathologic Stage    Colon, biopsy, distal transverse - TUBULOVILLOUS ADENOMA WITH HIGH GRADE DYSPLASIA.    01/12/2015 Procedure    Colonoscopy by Dr. Oneida Alar.    01/12/2015 Tumor Marker  CEA: 6.6 (H)     01/18/2015 Imaging    CT abd/pelvis- Apple-core lesion identified in the mid transverse colon without obstruction. No evidence for lymphadenopathy in the gastrohepatic ligament or omentum.  Stable 8 mm hypo attenuating lesion in the left liver, likely a cyst.    02/10/2015 Initial Diagnosis    Adenocarcinoma of transverse colon ([place])    02/12/2015 Definitive Surgery      [name], Extended right hemicolectomy     02/12/2015 Pathology Results    Mucinous adenocarcinoma with penetration of visceral peritoneum, 4/19 lymph nodes for metastatic disease, negative resection margins, with LVI and perineural invasion    03/30/2015 - 09/06/2015 Chemotherapy    FOLFOX x 12 cycles    05/25/2015 Treatment Plan Change    5 FU bolus discontinued for cycle #5    06/08/2015 Treatment Plan Change    Treatment deferred x 1 week    06/15/2015 Treatment Plan Change    5FU CI decreased by 10% and Oxaliplatin reduced by 15% for cycles #6-#11; Oxaliplatin dropped for cycle #12 d/t neuropathy.     10/20/2015 Imaging    CT CAP- Right hemicolectomy without evidence of metastatic disease. 2. Previously measured ground-glass lesion in the left upper lobe has resolved. 3. Probable food debris in the stomach, simulating gastric wall thickening. Please correlate clinically. 4. 6 mm irregular nodular density in the left upper lobe, stable. Continued attention on followup exams is warranted.    01/25/2016 Procedure    Colonoscopy by Dr. Oneida Alar- Non-thrombosed external hemorrhoids found on digital rectal exam. - One 4 mm polyp in the rectum, removed with a cold biopsy forceps. Resected and retrieved. - Congested mucosa in the neo-terminal ileum. Biopsied. - Redundant colon. - Internal hemorrhoids.    01/26/2016 Pathology Results    1. Terminal ileum, biopsy - MILD ACUTE (ACTIVE) ILEITIS. - NO DYSPLASIA OR MALIGNANCY IDENTIFIED - SEE COMMENT. 2. Rectum, polyp(s) - HYPERPLASTIC POLYP (X 1). - NO DYSPLASIA OR MALIGNANCY IDENTIFIED.    04/13/2016 Imaging    CT chest- Stable CT chest. 6 mm irregular nodule anterior left upper lobe is stable. The scattered areas of peribronchovascular micro nodularity in the lungs bilaterally are unchanged.    10/19/2016 Imaging    CT CAP: 1. Status post right hemicolectomy. No findings to suggest metastatic disease in the abdomen or  pelvis. 2. Aortic atherosclerosis. 3. Additional incidental findings, as above. Aortic Atherosclerosis (ICD10-I70.0).      Problem List Patient Active Problem List   Diagnosis Date Noted  . Psoriasis [L40.9] 09/12/2016  . Pulmonary nodule [R91.1] 06/14/2016  . History of colon cancer [Z85.038]   . Diarrhea [R19.7] 01/06/2016  . Cigarette nicotine dependence with nicotine-induced disorder [F17.219] 06/18/2015  . Chronic obstructive pulmonary disease ([place]) [J44.9] 06/18/2015  . Depression [F32.9] 06/18/2015  . B12 deficiency [E53.8] 04/14/2015  . Malignant neoplasm of transverse colon ([place]) [C18.4] 02/10/2015  . GERD (gastroesophageal reflux disease) [K21.9] 12/15/2014  . History of colonic polyps [Z86.010] 12/15/2014  . History of alcohol abuse [Z87.898] 09/16/2014  . Panic disorder [F41.0] 10/10/2012  . Major depressive disorder, single episode [F32.9] 10/10/2012  . Bilateral lower extremity edema [R60.0] 04/24/2012  . Hyperlipidemia [E78.5] 04/21/2009  . CAD, NATIVE VESSEL [I25.10] 03/25/2009  . HEAD TRAUMA [S09.90XA] 03/02/2009    Past Medical History Past Medical History:  Diagnosis Date  . Abnormal stress echocardiogram   . Adenocarcinoma of transverse colon ([place]) 02/10/2015  . Alcohol abuse    Heavy Use up until 2010  .  Anxiety   . Blood transfusion without reported diagnosis   . COPD (chronic obstructive pulmonary disease) ([place])   . Depression   . Emphysema of lung ([place])   . GERD (gastroesophageal reflux disease)   . Head trauma 2001   closed head injury; coma for 4 weeks  . Hypercholesterolemia   . Hypertension   . ING HERN W/GANGREN RECUR UNILAT/UNSPEC ING HERN 04/21/2009   Qualifier: Diagnosis of  By: Verl Blalock, MD, Delanna Ahmadi PTSD (post-traumatic stress disorder)   . Pulmonary nodule 06/14/2016  . SAH (subarachnoid hemorrhage) ([place]) 08/31/2012  . SDH (subdural hematoma) ([place]) 08/31/2012    Past Surgical History Past Surgical History:  Procedure  Laterality Date  . BIOPSY  01/25/2016   Procedure: BIOPSY;  Surgeon: Danie Binder, MD;  [facility];  Service: Endoscopy;;  ileum;   . cardiac cath    . COLONOSCOPY  2011   Dr. Oneida Alar: multiple adenomas and hyperplastic polyps  . COLONOSCOPY WITH PROPOFOL N/A 01/12/2015   Procedure: COLONOSCOPY WITH PROPOFOL;  Surgeon: Danie Binder, MD;  [facility];  Service: Endoscopy;  Laterality: N/A;  1030  . COLONOSCOPY WITH PROPOFOL N/A 01/25/2016   Procedure: COLONOSCOPY WITH PROPOFOL;  Surgeon: Danie Binder, MD;  [facility];  Service: Endoscopy;  Laterality: N/A;  1230  . CRANIOTOMY  2001  . ESOPHAGOGASTRODUODENOSCOPY (EGD) WITH PROPOFOL N/A 01/12/2015   Procedure: ESOPHAGOGASTRODUODENOSCOPY (EGD) WITH PROPOFOL;  Surgeon: Danie Binder, MD;  [facility];  Service: Endoscopy;  Laterality: N/A;  . head injury surgery    . HERNIA REPAIR Right 2012   Inguinal- Forestine Na  . KIDNEY SURGERY     >30 years ago  . LAPAROSCOPIC RIGHT HEMI COLECTOMY Left 02/10/2015   Procedure: LAPAROSCOPIC THEN OPEN RIGHT HEMI COLECTOMY;  Surgeon: [name], MD;  Location: [facility];  Service: General;  Laterality: Left;  . POLYPECTOMY  01/25/2016   Procedure: POLYPECTOMY;  Surgeon: Danie Binder, MD;  [facility];  Service: Endoscopy;;  colon  . PORT-A-CATH REMOVAL N/A 02/26/2017   Procedure: MINOR REMOVAL PORT-A-CATH;  Surgeon: Aviva Signs, MD;  Location: [facility];  Service: General;  Laterality: N/A;  Pt to arrive at 8:00am  . PORTACATH PLACEMENT N/A 03/24/2015   Procedure: INSERTION PORT-A-CATH;  Surgeon: Jules Husbands, MD;  Location: [facility];  Service: General;  Laterality: N/A;    Family History Family History  Problem Relation Age of Onset  . Pulmonary embolism Mother   . Breast cancer Mother   . Cancer Mother        breast cancer  . Arthritis Mother   . Heart disease Father   . Cancer Father 62       Leukemia  . Cancer Paternal  Grandfather        Lung  . Ataxia Neg Hx   . Chorea Neg Hx   . Dementia Neg Hx   . Mental retardation Neg Hx   . Migraines Neg Hx   . Multiple sclerosis Neg Hx   . Neurofibromatosis Neg Hx   . Neuropathy Neg Hx   . Parkinsonism Neg Hx   . Seizures Neg Hx   . Stroke Neg Hx   . Colon cancer Neg Hx      Social History  reports that he has been smoking cigarettes. He started smoking about 44 years ago. He has a 3.00 pack-year smoking history. He has never used smokeless tobacco. He  reports that he drinks alcohol. He reports that he does not use drugs.  Medications  Current Outpatient Medications:  .  amitriptyline (ELAVIL) 25 MG tablet, TAKE 1 TABLET BY MOUTH EVERY NIGHT AT BEDTIME (Patient taking differently: Take 25 mg by mouth at bedtime. ), Disp: 30 tablet, Rfl: 0 .  atorvastatin (LIPITOR) 20 MG tablet, Take 1 tablet (20 mg total) by mouth at bedtime., Disp: 90 tablet, Rfl: 3 .  buPROPion (WELLBUTRIN SR) 150 MG 12 hr tablet, Take 150 mg by mouth daily. , Disp: , Rfl:  .  FLUoxetine (PROZAC) 20 MG capsule, TAKE 1 CAPSULE BY MOUTH 2 TIMES A DAY (Patient taking differently: Take 40 mg by mouth every morning. ), Disp: 60 capsule, Rfl: 0 .  gabapentin (NEURONTIN) 300 MG capsule, Take 1-3 capsules (300-900 mg total) by mouth See admin instructions. 366m in the morning and 9068mat bedtime, Disp: 120 capsule, Rfl: 1 .  loperamide (IMODIUM) 2 MG capsule, TAKE 1 TO 2 CAPSULES BY MOUTH EVERY 6 HOURS AS NEEDED FOR DIARRHEA OR LOOSE STOOLS (Patient taking differently: Take 2-4 mg by mouth 2 (two) times daily. ), Disp: 60 capsule, Rfl: 0 .  omeprazole (PRILOSEC) 20 MG capsule, Take 20 mg by mouth daily., Disp: , Rfl:  .  PROAIR HFA 108 (90 Base) MCG/ACT inhaler, INHALE 2 PUFFS BY MOUTH EVERY 6 HOURS AS NEEDED FOR SHORTNESS OF BREATH/WHEEZING. (Patient taking differently: Inhale 2 puffs into the lungs every 6 (six) hours as needed for wheezing or shortness of breath. ), Disp: 18 each, Rfl:  2  Allergies Patient has no known allergies.  Review of Systems Review of Systems - Oncology ROS negative other than abdominal pain   Physical Exam  Vitals Wt Readings from Last 3 Encounters:  10/25/17 241 lb 1.6 oz (109.4 kg)  10/10/17 240 lb (108.9 kg)  07/31/17 243 lb 4.8 oz (110.4 kg)   Temp Readings from Last 3 Encounters:  10/25/17 98.1 F (36.7 C) (Oral)  10/10/17 (!) 96.8 F (36 C) (Temporal)  07/31/17 (!) 97.3 F (36.3 C) (Oral)   BP Readings from Last 3 Encounters:  10/25/17 135/82  10/10/17 (!) 189/95  07/31/17 116/72   Pulse Readings from Last 3 Encounters:  10/25/17 (!) 105  10/10/17 94  07/31/17 98   Constitutional: Well-developed, well-nourished, and in no distress.   HENT: Head: Normocephalic and atraumatic.  Mouth/Throat: No oropharyngeal exudate. Mucosa moist. Eyes: Pupils are equal, round, and reactive to light. Conjunctivae are normal. No scleral icterus.  Neck: Normal range of motion. Neck supple. No JVD present.  Cardiovascular: Normal rate, regular rhythm and normal heart sounds.  Exam reveals no gallop and no friction rub.   No murmur heard. Pulmonary/Chest: Effort normal and breath sounds normal. No respiratory distress. No wheezes.No rales.  Abdominal: Soft. Bowel sounds are normal. Obese.  Tender to palpation epigastric region.  No guarding.   Musculoskeletal: No edema or tenderness.  Lymphadenopathy: No cervical, axillary or supraclavicular adenopathy.  Neurological: Alert and oriented to person, place, and time. No cranial nerve deficit.  Skin: Skin is warm and dry. No rash noted. No erythema. No pallor.  Psychiatric: Affect and judgment normal.   Labs No visits with results within 3 Day(s) from this visit.  Latest known visit with results is:  Appointment on 10/22/2017  Component Date Value Ref Range Status  . WBC 10/22/2017 8.3  4.0 - 10.5 K/uL Final  . RBC 10/22/2017 4.37  4.22 - 5.81 MIL/uL Final  . Hemoglobin 10/22/2017  14.1   13.0 - 17.0 g/dL Final  . HCT 10/22/2017 42.3  39.0 - 52.0 % Final  . MCV 10/22/2017 96.8  78.0 - 100.0 fL Final  . MCH 10/22/2017 32.3  26.0 - 34.0 pg Final  . MCHC 10/22/2017 33.3  30.0 - 36.0 g/dL Final  . RDW 10/22/2017 13.3  11.5 - 15.5 % Final  . Platelets 10/22/2017 185  150 - 400 K/uL Final  . Neutrophils Relative % 10/22/2017 71  % Final  . Neutro Abs 10/22/2017 5.9  1.7 - 7.7 K/uL Final  . Lymphocytes Relative 10/22/2017 18  % Final  . Lymphs Abs 10/22/2017 1.5  0.7 - 4.0 K/uL Final  . Monocytes Relative 10/22/2017 8  % Final  . Monocytes Absolute 10/22/2017 0.6  0.1 - 1.0 K/uL Final  . Eosinophils Relative 10/22/2017 2  % Final  . Eosinophils Absolute 10/22/2017 0.2  0.0 - 0.7 K/uL Final  . Basophils Relative 10/22/2017 1  % Final  . Basophils Absolute 10/22/2017 0.1  0.0 - 0.1 K/uL Final   Performed at [facility], [address] [place], [place] [address]  . Sodium 10/22/2017 138  135 - 145 mmol/L Final  . Potassium 10/22/2017 3.9  3.5 - 5.1 mmol/L Final  . Chloride 10/22/2017 100  98 - 111 mmol/L Final  . CO2 10/22/2017 30  22 - 32 mmol/L Final  . Glucose, Bld 10/22/2017 103* 70 - 99 mg/dL Final  . BUN 10/22/2017 10  6 - 20 mg/dL Final  . Creatinine, Ser 10/22/2017 0.88  0.61 - 1.24 mg/dL Final  . Calcium 10/22/2017 9.1  8.9 - 10.3 mg/dL Final  . Total Protein 10/22/2017 7.2  6.5 - 8.1 g/dL Final  . Albumin 10/22/2017 3.7  3.5 - 5.0 g/dL Final  . AST 10/22/2017 16  15 - 41 U/L Final  . ALT 10/22/2017 13  0 - 44 U/L Final  . Alkaline Phosphatase 10/22/2017 61  38 - 126 U/L Final  . Total Bilirubin 10/22/2017 0.7  0.3 - 1.2 mg/dL Final  . GFR calc non Af Amer 10/22/2017 >60  >60 mL/min Final  . GFR calc Af Amer 10/22/2017 >60  >60 mL/min Final   Comment: (NOTE) The eGFR has been calculated using the CKD EPI equation. This calculation has not been validated in all clinical situations. eGFR's persistently <60 mL/min signify possible Chronic Kidney Disease.   Georgiann Hahn  gap 10/22/2017 8  5 - 15 Final   Performed at [facility], [address] [place], [place] [address]  . LDH 10/22/2017 120  98 - 192 U/L Final   Performed at [facility], [address] [place], [place] [address]  . Ferritin 10/22/2017 92  24 - 336 ng/mL Final   Performed at [facility], [address] [place], [place] [address]  . CEA 10/22/2017 9.1* 0.0 - 4.7 ng/mL Final   Comment: (NOTE)                             Nonsmokers          <3.9                             Smokers             <5.6 [employer] Electrochemiluminescence Immunoassay (ECLIA) Values obtained with different assay methods or kits cannot be used interchangeably.  Results cannot be interpreted  as absolute evidence of the presence or absence of malignant disease. Performed At: [facility] [place], [place] [id number] Rush Farmer MD DS:[id number]      Pathology No orders of the defined types were placed in this encounter.      Zoila Shutter MD

## 2017-10-25 NOTE — Patient Instructions (Signed)
[facility] at [facility] Discharge Instructions  You saw Dr. Higgs today.   Thank you for choosing [facility] at [facility] to provide your oncology and hematology care.  To afford each patient quality time with our provider, please arrive at least 15 minutes before your scheduled appointment time.   If you have a lab appointment with the Cancer Center please come in thru the  Main Entrance and check in at the main information desk  You need to re-schedule your appointment should you arrive 10 or more minutes late.  We strive to give you quality time with our providers, and arriving late affects you and other patients whose appointments are after yours.  Also, if you no show three or more times for appointments you may be dismissed from the clinic at the providers discretion.     Again, thank you for choosing [facility].  Our hope is that these requests will decrease the amount of time that you wait before being seen by our physicians.       _____________________________________________________________  Should you have questions after your visit to  Cancer Center, please contact our office at [phone] between the hours of 8:00 a.m. and 4:30 p.m.  Voicemails left after 4:00 p.m. will not be returned until the following business day.  For prescription refill requests, have your pharmacy contact our office and allow 72 hours.    Cancer Center Support Programs:   > Cancer Support Group  2nd Tuesday of the month 1pm-2pm, Journey Room    

## 2017-10-30 ENCOUNTER — Other Ambulatory Visit (HOSPITAL_COMMUNITY): Payer: Self-pay | Admitting: Nurse Practitioner

## 2017-10-30 DIAGNOSIS — T451X5A Adverse effect of antineoplastic and immunosuppressive drugs, initial encounter: Principal | ICD-10-CM

## 2017-10-30 DIAGNOSIS — E538 Deficiency of other specified B group vitamins: Secondary | ICD-10-CM

## 2017-10-30 DIAGNOSIS — C184 Malignant neoplasm of transverse colon: Secondary | ICD-10-CM

## 2017-10-30 DIAGNOSIS — G620 Drug-induced polyneuropathy: Secondary | ICD-10-CM

## 2017-11-01 ENCOUNTER — Ambulatory Visit (INDEPENDENT_AMBULATORY_CARE_PROVIDER_SITE_OTHER): Payer: Medicaid Other | Admitting: Gastroenterology

## 2017-11-01 ENCOUNTER — Encounter: Payer: Self-pay | Admitting: Gastroenterology

## 2017-11-01 DIAGNOSIS — R197 Diarrhea, unspecified: Secondary | ICD-10-CM

## 2017-11-01 DIAGNOSIS — C184 Malignant neoplasm of transverse colon: Secondary | ICD-10-CM

## 2017-11-01 DIAGNOSIS — K909 Intestinal malabsorption, unspecified: Secondary | ICD-10-CM

## 2017-11-01 DIAGNOSIS — K219 Gastro-esophageal reflux disease without esophagitis: Secondary | ICD-10-CM

## 2017-11-01 NOTE — Progress Notes (Signed)
Subjective:    Patient ID: Erik [name], male    DOB: [birth year], 62 y.o.   MRN: [id number]  HPI PT HAVING EVIDENCE FOR OMENETAL IMPLANTS ON CT. NEUROPATHY FROM CHEMO. STARTED AFTER LAST CHEMO Rx.  DIARRHEA: DAILY, 3-4 TIMES A DAY(#6-7), NL FORMED STOOL-NEVER. DOESN'T SEEM TO MATTER. IMODIUM HELPS AND USING BID.OCCASIONAL NAUSEA. MAY SEE BLACK STOOL: NO PEPTO. NL Hb 9/16. MAY FEEL MILD LLQ ABDOMINAL PAIN.  PT DENIES FEVER, CHILLS, HEMATOCHEZIA, HEMATEMESIS, vomiting, melena, CHEST PAIN, SHORTNESS OF BREATH,  CHANGE IN BOWEL IN HABITS, constipation, problems swallowing, OR eartburn or indigestion.  Past Medical History:  Diagnosis Date  . Abnormal stress echocardiogram   . Adenocarcinoma of transverse colon ([place]) 02/10/2015  . Alcohol abuse    Heavy Use up until 2010  . Anxiety   . Blood transfusion without reported diagnosis   . COPD (chronic obstructive pulmonary disease) ([place])   . Depression   . Emphysema of lung ([place])   . GERD (gastroesophageal reflux disease)   . Head trauma 2001   closed head injury; coma for 4 weeks  . Hypercholesterolemia   . Hypertension   . ING HERN W/GANGREN RECUR UNILAT/UNSPEC ING HERN 04/21/2009   Qualifier: Diagnosis of  By: Verl Blalock, MD, Delanna Ahmadi PTSD (post-traumatic stress disorder)   . Pulmonary nodule 06/14/2016  . SAH (subarachnoid hemorrhage) ([place]) 08/31/2012  . SDH (subdural hematoma) ([place]) 08/31/2012   Past Surgical History:  Procedure Laterality Date  . BIOPSY  01/25/2016   Procedure: BIOPSY;  Surgeon: Danie Binder, MD;  [facility];  Service: Endoscopy;;  ileum;   . cardiac cath    . COLONOSCOPY  2011   Dr. Oneida Alar: multiple adenomas and hyperplastic polyps  . COLONOSCOPY WITH PROPOFOL N/A 01/12/2015   Procedure: COLONOSCOPY WITH PROPOFOL;  Surgeon: Danie Binder, MD;  [facility];  Service: Endoscopy;  Laterality: N/A;  1030  . COLONOSCOPY WITH PROPOFOL N/A 01/25/2016   Procedure: COLONOSCOPY WITH  PROPOFOL;  Surgeon: Danie Binder, MD;  [facility];  Service: Endoscopy;  Laterality: N/A;  1230  . CRANIOTOMY  2001  . ESOPHAGOGASTRODUODENOSCOPY (EGD) WITH PROPOFOL N/A 01/12/2015   Procedure: ESOPHAGOGASTRODUODENOSCOPY (EGD) WITH PROPOFOL;  Surgeon: Danie Binder, MD;  [facility];  Service: Endoscopy;  Laterality: N/A;  . head injury surgery    . HERNIA REPAIR Right 2012   Inguinal- Forestine Na  . KIDNEY SURGERY     >30 years ago  . LAPAROSCOPIC RIGHT HEMI COLECTOMY Left 02/10/2015   Procedure: LAPAROSCOPIC THEN OPEN RIGHT HEMI COLECTOMY;  Surgeon: [name], MD;  Location: [facility];  Service: General;  Laterality: Left;  . POLYPECTOMY  01/25/2016   Procedure: POLYPECTOMY;  Surgeon: Danie Binder, MD;  [facility];  Service: Endoscopy;;  colon  . PORT-A-CATH REMOVAL N/A 02/26/2017   Procedure: MINOR REMOVAL PORT-A-CATH;  Surgeon: Aviva Signs, MD;  [facility];  Service: General;  Laterality: N/A;  Pt to arrive at [place] N/A 03/24/2015   Procedure: INSERTION PORT-A-CATH;  Surgeon: Jules Husbands, MD;  Location: [facility];  Service: General;  Laterality: N/A;   No Known Allergies  Current Outpatient Medications  Medication Sig    . amitriptyline (ELAVIL) 25 MG tablet TAKE 1 TABLET BY MOUTH EVERY NIGHT AT BEDTIME (Patient taking differently: Take 25 mg by mouth at bedtime. )    . atorvastatin (LIPITOR) 20 MG tablet Take 1  tablet (20 mg total) by mouth at bedtime.    Marland Kitchen buPROPion (WELLBUTRIN SR) 150 MG 12 hr tablet Take 150 mg by mouth daily.     Marland Kitchen FLUoxetine (PROZAC) 20 MG capsule TAKE 1 CAPSULE BY MOUTH 2 TIMES A DAY (Patient taking differently: Take 60 mg by mouth every morning. )    . gabapentin (NEURONTIN) 300 MG capsule Take 1-3 capsules (300-900 mg total) by mouth See admin instructions. 300mg  in the morning and 900mg  at bedtime    . loperamide (IMODIUM) 2 MG capsule  Take 2-4 mg by mouth as needed. )    .  omeprazole (PRILOSEC) 20 MG capsule Take 20 mg by mouth daily.    .       Review of Systems PER HPI OTHERWISE ALL SYSTEMS ARE NEGATIVE.    Objective:   Physical Exam  Constitutional: He is oriented to person, place, and time. He appears well-developed and well-nourished. No distress.  HENT:  Head: Normocephalic and atraumatic.  Mouth/Throat: Oropharynx is clear and moist. No oropharyngeal exudate.  Eyes: Pupils are equal, round, and reactive to light. No scleral icterus.  Neck: Normal range of motion. Neck supple.  Cardiovascular: Normal rate, regular rhythm and normal heart sounds.  Pulmonary/Chest: Effort normal and breath sounds normal. No respiratory distress.  Abdominal: Soft. Bowel sounds are normal. He exhibits no distension. There is tenderness. There is no rebound and no guarding.  MILD RUQ/RLQ TTP   Musculoskeletal: He exhibits no edema.  WALKS ASSISTED WITH A CANE.  Lymphadenopathy:    He has no cervical adenopathy.  Neurological: He is alert and oriented to person, place, and time.  NO  NEW FOCAL DEFICITS  Psychiatric: He has a normal mood and affect.  Vitals reviewed.     Assessment & Plan:

## 2017-11-01 NOTE — Assessment & Plan Note (Signed)
SYMPTOMS CONTROLLED/RESOLVED.  AVOID TRIGGERS.  HANDOUT GIVEN. CONTINUE OMEPRAZOLE.  TAKE 30 MINUTES PRIOR TO YOUR FIRST MEAL. FOLLOW UP IN 1 YEAR.

## 2017-11-01 NOTE — Assessment & Plan Note (Addendum)
MOST LIKELY DUE TO BILE SALT INDUCED DIARRHEA IN A PT S/ R HEMICOLECTOMY.  CHEW ONE TUMS WITH MEALS THREE TIMES A DAY TO PREVENT DIARRHEA. FOLLOW A LOW FAT DIET.  HANDOUT GIVEN. FOLLOW UP IN 1 YEAR.

## 2017-11-01 NOTE — Patient Instructions (Addendum)
I SPOKE WITH DR. Walden Field TODAY AND WE AGREE YOU SHOULD HAVE THE PET SCAN FIRST AND THEN WE WILL LET YOU KNOW WHAT THE NEXT STEP WILL BEE.  DRINK WATER TO KEEP YOUR URINE LIGHT YELLOW.  TO REDUCE DIARRHEA:    1. FOLLOW A LOW FAT DIET. AVOID ITEMS THAT CAUSE BLOATING & GAS. SEE INFO BELOW.    2. CHEW ONE TUMS WITH MEALS THREE TIMES A DAY.  PLEASE CALL WITH QUESTIONS OR CONCERNS.   NEXT COLONOSCOPY IN [place] 2020.  Low-Fat Diet BREADS, CEREALS, PASTA, RICE, DRIED PEAS, AND BEANS These products are high in carbohydrates and most are low in fat. Therefore, they can be increased in the diet as substitutes for fatty foods. They too, however, contain calories and should not be eaten in excess. Cereals can be eaten for snacks as well as for breakfast.   FRUITS AND VEGETABLES It is good to eat fruits and vegetables. Besides being sources of fiber, both are rich in vitamins and some minerals. They help you get the daily allowances of these nutrients. Fruits and vegetables can be used for snacks and desserts.  MEATS Limit lean meat, chicken, [place], and fish to no more than 6 ounces per day. Beef, Pork, and Lamb Use lean cuts of beef, pork, and lamb. Lean cuts include:  Extra-lean ground beef.  Arm roast.  Sirloin tip.  Center-cut ham.  Round steak.  Loin chops.  Rump roast.  Tenderloin.  Trim all fat off the outside of meats before cooking. It is not necessary to severely decrease the intake of red meat, but lean choices should be made. Lean meat is rich in protein and contains a highly absorbable form of iron. Premenopausal women, in particular, should avoid reducing lean red meat because this could increase the risk for low red blood cells (iron-deficiency anemia).  Chicken and [place] These are good sources of protein. The fat of poultry can be reduced by removing the skin and underlying fat layers before cooking. Chicken and [place] can be substituted for lean red meat in the diet. Poultry  should not be fried or covered with high-fat sauces. Fish and Shellfish Fish is a good source of protein. Shellfish contain cholesterol, but they usually are low in saturated fatty acids. The preparation of fish is important. Like chicken and [place], they should not be fried or covered with high-fat sauces. EGGS Egg whites contain no fat or cholesterol. They can be eaten often. Try 1 to 2 egg whites instead of whole eggs in recipes or use egg substitutes that do not contain yolk. MILK AND DAIRY PRODUCTS Use skim or 1% milk instead of 2% or whole milk. Decrease whole milk, natural, and processed cheeses. Use nonfat or low-fat (2%) cottage cheese or low-fat cheeses made from vegetable oils. Choose nonfat or low-fat (1 to 2%) yogurt. Experiment with evaporated skim milk in recipes that call for heavy cream. Substitute low-fat yogurt or low-fat cottage cheese for sour cream in dips and salad dressings. Have at least 2 servings of low-fat dairy products, such as 2 glasses of skim (or 1%) milk each day to help get your daily calcium intake. FATS AND OILS Reduce the total intake of fats, especially saturated fat. Butterfat, lard, and beef fats are high in saturated fat and cholesterol. These should be avoided as much as possible. Vegetable fats do not contain cholesterol, but certain vegetable fats, such as coconut oil, palm oil, and palm kernel oil are very high in saturated fats. These should  be limited. These fats are often used in bakery goods, processed foods, popcorn, oils, and nondairy creamers. Vegetable shortenings and some peanut butters contain hydrogenated oils, which are also saturated fats. Read the labels on these foods and check for saturated vegetable oils. Unsaturated vegetable oils and fats do not raise blood cholesterol. However, they should be limited because they are fats and are high in calories. Total fat should still be limited to 30% of your daily caloric intake. Desirable liquid  vegetable oils are corn oil, cottonseed oil, olive oil, canola oil, safflower oil, soybean oil, and sunflower oil. Peanut oil is not as good, but small amounts are acceptable. Buy a heart-healthy tub margarine that has no partially hydrogenated oils in the ingredients. Mayonnaise and salad dressings often are made from unsaturated fats, but they should also be limited because of their high calorie and fat content. Seeds, nuts, peanut butter, olives, and avocados are high in fat, but the fat is mainly the unsaturated type. These foods should be limited mainly to avoid excess calories and fat. OTHER EATING TIPS Snacks  Most sweets should be limited as snacks. They tend to be rich in calories and fats, and their caloric content outweighs their nutritional value. Some good choices in snacks are graham crackers, melba toast, soda crackers, bagels (no egg), English muffins, fruits, and vegetables. These snacks are preferable to snack crackers, [place] fries, TORTILLA CHIPS, and POTATO chips. Popcorn should be air-popped or cooked in small amounts of liquid vegetable oil. Desserts Eat fruit, low-fat yogurt, and fruit ices instead of pastries, cake, and cookies. Sherbet, angel food cake, gelatin dessert, frozen low-fat yogurt, or other frozen products that do not contain saturated fat (pure fruit juice bars, frozen ice pops) are also acceptable.  COOKING METHODS Choose those methods that use little or no fat. They include: Poaching.  Braising.  Steaming.  Grilling.  Baking.  Stir-frying.  Broiling.  Microwaving.  Foods can be cooked in a nonstick pan without added fat, or use a nonfat cooking spray in regular cookware. Limit fried foods and avoid frying in saturated fat. Add moisture to lean meats by using water, broth, cooking wines, and other nonfat or low-fat sauces along with the cooking methods mentioned above. Soups and stews should be chilled after cooking. The fat that forms on top after a few hours  in the refrigerator should be skimmed off. When preparing meals, avoid using excess salt. Salt can contribute to raising blood pressure in some people.  EATING AWAY FROM HOME Order entres, potatoes, and vegetables without sauces or butter. When meat exceeds the size of a deck of cards (3 to 4 ounces), the rest can be taken home for another meal. Choose vegetable or fruit salads and ask for low-calorie salad dressings to be served on the side. Use dressings sparingly. Limit high-fat toppings, such as bacon, crumbled eggs, cheese, sunflower seeds, and olives. Ask for heart-healthy tub margarine instead of butter.

## 2017-11-01 NOTE — Assessment & Plan Note (Signed)
RECENT CT SHOWS POSSIBLE OMENTAL IMPLANTS.  I SPOKE WITH DR. Walden Field TODAY. PET SCAN THEN CT GUIDED BIOPSY IF POSITIVE.  DRINK WATER TO KEEP YOUR URINE LIGHT YELLOW. FOLLOW A HIGH FIBER DIET. AVOID ITEMS THAT CAUSE BLOATING & GAS.  HANDOUT GIVEN. PLEASE CALL WITH QUESTIONS OR CONCERNS.  NEXT COLONOSCOPY IN DEC 2020

## 2017-11-01 NOTE — Progress Notes (Signed)
No pcp per patient 

## 2017-11-13 ENCOUNTER — Other Ambulatory Visit (HOSPITAL_COMMUNITY): Payer: Self-pay | Admitting: Hematology

## 2017-11-13 DIAGNOSIS — C184 Malignant neoplasm of transverse colon: Secondary | ICD-10-CM

## 2017-11-13 DIAGNOSIS — G620 Drug-induced polyneuropathy: Secondary | ICD-10-CM

## 2017-11-13 DIAGNOSIS — T451X5A Adverse effect of antineoplastic and immunosuppressive drugs, initial encounter: Principal | ICD-10-CM

## 2017-11-14 ENCOUNTER — Other Ambulatory Visit (HOSPITAL_COMMUNITY): Payer: Self-pay

## 2017-11-14 DIAGNOSIS — E538 Deficiency of other specified B group vitamins: Secondary | ICD-10-CM

## 2017-11-14 DIAGNOSIS — T451X5A Adverse effect of antineoplastic and immunosuppressive drugs, initial encounter: Principal | ICD-10-CM

## 2017-11-14 DIAGNOSIS — C184 Malignant neoplasm of transverse colon: Secondary | ICD-10-CM

## 2017-11-14 DIAGNOSIS — G620 Drug-induced polyneuropathy: Secondary | ICD-10-CM

## 2017-11-16 ENCOUNTER — Inpatient Hospital Stay (HOSPITAL_COMMUNITY): Payer: Medicaid Other | Attending: Hematology

## 2017-11-16 DIAGNOSIS — I10 Essential (primary) hypertension: Secondary | ICD-10-CM | POA: Diagnosis not present

## 2017-11-16 DIAGNOSIS — R05 Cough: Secondary | ICD-10-CM | POA: Diagnosis not present

## 2017-11-16 DIAGNOSIS — R0989 Other specified symptoms and signs involving the circulatory and respiratory systems: Secondary | ICD-10-CM | POA: Diagnosis not present

## 2017-11-16 DIAGNOSIS — R110 Nausea: Secondary | ICD-10-CM | POA: Diagnosis not present

## 2017-11-16 DIAGNOSIS — G620 Drug-induced polyneuropathy: Secondary | ICD-10-CM | POA: Diagnosis not present

## 2017-11-16 DIAGNOSIS — R911 Solitary pulmonary nodule: Secondary | ICD-10-CM | POA: Insufficient documentation

## 2017-11-16 DIAGNOSIS — Z85038 Personal history of other malignant neoplasm of large intestine: Secondary | ICD-10-CM | POA: Insufficient documentation

## 2017-11-16 DIAGNOSIS — T451X5A Adverse effect of antineoplastic and immunosuppressive drugs, initial encounter: Secondary | ICD-10-CM | POA: Insufficient documentation

## 2017-11-16 DIAGNOSIS — E538 Deficiency of other specified B group vitamins: Secondary | ICD-10-CM

## 2017-11-16 DIAGNOSIS — C184 Malignant neoplasm of transverse colon: Secondary | ICD-10-CM

## 2017-11-16 LAB — CBC WITH DIFFERENTIAL/PLATELET
ABS IMMATURE GRANULOCYTES: 0.03 10*3/uL (ref 0.00–0.07)
Basophils Absolute: 0.1 10*3/uL (ref 0.0–0.1)
Basophils Relative: 1 %
Eosinophils Absolute: 0 10*3/uL (ref 0.0–0.5)
Eosinophils Relative: 0 %
HEMATOCRIT: 45.4 % (ref 39.0–52.0)
Hemoglobin: 14.8 g/dL (ref 13.0–17.0)
Immature Granulocytes: 0 %
LYMPHS ABS: 0.8 10*3/uL (ref 0.7–4.0)
Lymphocytes Relative: 10 %
MCH: 30.5 pg (ref 26.0–34.0)
MCHC: 32.6 g/dL (ref 30.0–36.0)
MCV: 93.4 fL (ref 80.0–100.0)
MONO ABS: 1.1 10*3/uL — AB (ref 0.1–1.0)
MONOS PCT: 13 %
Neutro Abs: 6.4 10*3/uL (ref 1.7–7.7)
Neutrophils Relative %: 76 %
Platelets: 192 10*3/uL (ref 150–400)
RBC: 4.86 MIL/uL (ref 4.22–5.81)
RDW: 13 % (ref 11.5–15.5)
WBC: 8.5 10*3/uL (ref 4.0–10.5)
nRBC: 0 % (ref 0.0–0.2)

## 2017-11-16 LAB — COMPREHENSIVE METABOLIC PANEL
ALT: 26 U/L (ref 0–44)
AST: 32 U/L (ref 15–41)
Albumin: 3.8 g/dL (ref 3.5–5.0)
Alkaline Phosphatase: 92 U/L (ref 38–126)
Anion gap: 11 (ref 5–15)
BILIRUBIN TOTAL: 0.7 mg/dL (ref 0.3–1.2)
BUN: 8 mg/dL (ref 6–20)
CALCIUM: 8.8 mg/dL — AB (ref 8.9–10.3)
CHLORIDE: 96 mmol/L — AB (ref 98–111)
CO2: 25 mmol/L (ref 22–32)
Creatinine, Ser: 0.84 mg/dL (ref 0.61–1.24)
Glucose, Bld: 122 mg/dL — ABNORMAL HIGH (ref 70–99)
Potassium: 3.8 mmol/L (ref 3.5–5.1)
Sodium: 132 mmol/L — ABNORMAL LOW (ref 135–145)
Total Protein: 8.3 g/dL — ABNORMAL HIGH (ref 6.5–8.1)

## 2017-11-17 LAB — CEA: CEA: 12.9 ng/mL — ABNORMAL HIGH (ref 0.0–4.7)

## 2017-11-19 ENCOUNTER — Encounter (HOSPITAL_COMMUNITY)
Admission: RE | Admit: 2017-11-19 | Discharge: 2017-11-19 | Disposition: A | Discharge status: Home / Self Care | Payer: Medicaid Other | Source: Ambulatory Visit | Attending: Nurse Practitioner | Admitting: Nurse Practitioner

## 2017-11-19 DIAGNOSIS — C184 Malignant neoplasm of transverse colon: Secondary | ICD-10-CM | POA: Diagnosis present

## 2017-11-19 MED ORDER — FLUDEOXYGLUCOSE F - 18 (FDG) INJECTION
14.5800 | Freq: Once | INTRAVENOUS | Status: AC | PRN
  Administered 2017-11-19: 14.58 via INTRAVENOUS

## 2017-11-20 ENCOUNTER — Encounter (HOSPITAL_COMMUNITY): Payer: Self-pay | Admitting: Internal Medicine

## 2017-11-20 ENCOUNTER — Inpatient Hospital Stay (HOSPITAL_BASED_OUTPATIENT_CLINIC_OR_DEPARTMENT_OTHER): Payer: Medicaid Other | Admitting: Internal Medicine

## 2017-11-20 VITALS — BP 140/77 | HR 110 | Temp 98.0°F | Resp 20 | Wt 232.1 lb

## 2017-11-20 DIAGNOSIS — R911 Solitary pulmonary nodule: Secondary | ICD-10-CM

## 2017-11-20 DIAGNOSIS — G620 Drug-induced polyneuropathy: Secondary | ICD-10-CM | POA: Diagnosis not present

## 2017-11-20 DIAGNOSIS — I10 Essential (primary) hypertension: Secondary | ICD-10-CM

## 2017-11-20 DIAGNOSIS — R0989 Other specified symptoms and signs involving the circulatory and respiratory systems: Secondary | ICD-10-CM

## 2017-11-20 DIAGNOSIS — R9389 Abnormal findings on diagnostic imaging of other specified body structures: Secondary | ICD-10-CM

## 2017-11-20 DIAGNOSIS — R110 Nausea: Secondary | ICD-10-CM

## 2017-11-20 DIAGNOSIS — C184 Malignant neoplasm of transverse colon: Secondary | ICD-10-CM

## 2017-11-20 DIAGNOSIS — T451X5A Adverse effect of antineoplastic and immunosuppressive drugs, initial encounter: Secondary | ICD-10-CM

## 2017-11-20 DIAGNOSIS — R05 Cough: Secondary | ICD-10-CM

## 2017-11-20 DIAGNOSIS — Z85038 Personal history of other malignant neoplasm of large intestine: Secondary | ICD-10-CM

## 2017-11-20 MED ORDER — AZITHROMYCIN 250 MG PO TABS: ORAL_TABLET | ORAL | 0 refills | Status: DC

## 2017-11-20 MED ORDER — GABAPENTIN 300 MG PO CAPS: 300.0000 mg | ORAL_CAPSULE | ORAL | 1 refills | Status: DC

## 2017-11-20 NOTE — Addendum Note (Signed)
Addended by: Farley Ly on: 11/20/2017 04:36 PM   Modules accepted: Orders

## 2017-11-20 NOTE — Progress Notes (Signed)
Diagnosis Adenocarcinoma of transverse colon ([place]) - Plan: CT BIOPSY  Abnormal CT scan - Plan: CT BIOPSY  Staging Cancer Staging Malignant neoplasm of transverse colon [facility]) Staging form: Colon and Rectum, AJCC 7th Edition - Pathologic stage from 03/04/2015: Stage IIIC (T4a, N2a, cM0) - Signed by Baird Cancer, PA-C on 03/04/2015   Assessment and Plan:  1. Stage IIIC adenocarcinoma of transverse colon.  62 yr old male previously followed by Dr. Talbert Cage.  He was diagnosed in 01/2015 on screening colonoscopy.  CEA elevated at time of diagnosis at 6.6. Treated with definitve surgery with right hemicolectomy by Dr. Adonis Huguenin on 02/12/15. Went on to have adjuvant chemo with FOLFOX x 12 cycles; chemo course was complicated by progressive peripheral neuropathy and thus Oxaliplatin was dose-reduced for cycles #6-11, and then d/c'd for cycle #12.   Pt had colonoscopy in 01/2016 with Dr. Oneida Alar. Path revealed 1 hyperplastic polyp.  No evidence of malignancy.    Pt was recommended for CT imaging but has not had that performed due to inability to get scan approved.  He is complaining of persistent nausea.    CT chest, abdomen and pelvis was recommended  as his last imaging was in 10/2016 for ongoing assessment due to symptoms.    Pt had CT CAP done 10/23/2017 that was reviewed and showed  IMPRESSION: 1. Subtle but new nodularity along the upper omentum along with nodular density in the pelvic mesentery adjacent to the sigmoid colon, concerning for peritoneal and omental implants of tumor. 2. Mild wall thickening in the sigmoid colon could reflect a low-grade colitis. 3. Trace perihepatic ascites. 4. Other imaging findings of potential clinical significance: Aortic Atherosclerosis (ICD10-I70.0) and Emphysema (ICD10-J43.9). Coronary atherosclerosis. Airway thickening is present, suggesting bronchitis or reactive airways disease. Degenerative right glenohumeral arthropathy with free osteochondral  fragment in the subscapular recess. Nonobstructive single bilateral punctate renal calculi. Prior right hemicolectomy. Mild prostatomegaly. Mild impingement at L4-5 and L5-S1.  Pt was recommended for PET scan which was done on 11/19/2017 that showed  IMPRESSION: 1. Similar to 10/23/2017 is progressive peritoneal nodularity worrisome for peritoneal carcinomatosis. This appears new when compared with 10/19/2016. 2. Increase in size of right paratracheal lymph node with mild FDG uptake. Nonspecific. There is also mild nonspecific increased uptake within the right hilar region. 3. Reticulonodular densities identified within both lungs with an upper lobe predominance are favored to represent postinflammatory changes. 4. Aortic Atherosclerosis (ICD10-I70.0) and Emphysema (ICD10-J43.9). 5. Coronary artery atherosclerotic calcifications.  Labs done 11/16/2017 reviewed and showed WBC 8.5 HB 14.8 and plts 192,000.  Chemistries WNL with K+ 3.8 Cr 0.84 and normal LFTs  CEA elevated at 12.9.  Previously, Scan discussed with Dr. Oneida Alar and IR.  Dr. Markus Daft who reviewed CT scan.   Pt referred to IR for review for possible CT biopsy of omental lesions.  He will RTC to go over results of biopsy.  If IR biopsy not possible, pt will be referred to surgery.    2.  LUL pulmonary nodule/ Right paratracheal LN.  This finding was non specific and will continue to be monitored.  Pt had 6 mm nodule on scan done 04/2016.  CT chest done 10/23/2017 showed stable nodule.    3.  Cough and congestion.  Pt is a smoker.  Rx for Zpac as directed sent to pharmacy.  He should notify the office if no improvement in symptoms.    4  HTN.  BP is 140/77.  Follow-up with PCP.   5.  Smoking.  Cessation is recommended.    6.  Neuropathy.  Pt requests refills on neurontin  Rx sent to pharmacy.    25 minutes spent with more than 50% spent in counseling and coordination of care.    Interval History:  From [name], PA-C's last  note on 06/14/16)    Current Status:  Pt is seen today for follow-up.  He is here to go over PET scan.  He is reporting some cough and congestion.  He is requesting refills on Neurontin.     Oncology History   Stage IIIC [facility]) adenocarcinoma of transverse colon, diagnosed on colonoscopy by Dr. Oneida Alar on 01/12/2015 followed by definitive surgery by Dr. [name] with right hemicolectomy on 02/12/2015.  He then underwent FOLFOX x 12 cycles in the adjuvant setting (03/30/2015- 09/06/2015).     Malignant neoplasm of transverse colon ([place])   01/12/2015 Pathologic Stage    Colon, biopsy, distal transverse - TUBULOVILLOUS ADENOMA WITH HIGH GRADE DYSPLASIA.    01/12/2015 Procedure    Colonoscopy by Dr. Oneida Alar.    01/12/2015 Tumor Marker    CEA: 6.6 (H)     01/18/2015 Imaging    CT abd/pelvis- Apple-core lesion identified in the mid transverse colon without obstruction. No evidence for lymphadenopathy in the gastrohepatic ligament or omentum.  Stable 8 mm hypo attenuating lesion in the left liver, likely a cyst.    02/10/2015 Initial Diagnosis    Adenocarcinoma of transverse colon ([place])    02/12/2015 Definitive Surgery    [name], Extended right hemicolectomy     02/12/2015 Pathology Results    Mucinous adenocarcinoma with penetration of visceral peritoneum, 4/19 lymph nodes for metastatic disease, negative resection margins, with LVI and perineural invasion    03/30/2015 - 09/06/2015 Chemotherapy    FOLFOX x 12 cycles    05/25/2015 Treatment Plan Change    5 FU bolus discontinued for cycle #5    06/08/2015 Treatment Plan Change    Treatment deferred x 1 week    06/15/2015 Treatment Plan Change    5FU CI decreased by 10% and Oxaliplatin reduced by 15% for cycles #6-#11; Oxaliplatin dropped for cycle #12 d/t neuropathy.     10/20/2015 Imaging    CT CAP- Right hemicolectomy without evidence of metastatic disease. 2. Previously measured ground-glass lesion in the left upper lobe has  resolved. 3. Probable food debris in the stomach, simulating gastric wall thickening. Please correlate clinically. 4. 6 mm irregular nodular density in the left upper lobe, stable. Continued attention on followup exams is warranted.    01/25/2016 Procedure    Colonoscopy by Dr. Oneida Alar- Non-thrombosed external hemorrhoids found on digital rectal exam. - One 4 mm polyp in the rectum, removed with a cold biopsy forceps. Resected and retrieved. - Congested mucosa in the neo-terminal ileum. Biopsied. - Redundant colon. - Internal hemorrhoids.    01/26/2016 Pathology Results    1. Terminal ileum, biopsy - MILD ACUTE (ACTIVE) ILEITIS. - NO DYSPLASIA OR MALIGNANCY IDENTIFIED - SEE COMMENT. 2. Rectum, polyp(s) - HYPERPLASTIC POLYP (X 1). - NO DYSPLASIA OR MALIGNANCY IDENTIFIED.    04/13/2016 Imaging    CT chest- Stable CT chest. 6 mm irregular nodule anterior left upper lobe is stable. The scattered areas of peribronchovascular micro nodularity in the lungs bilaterally are unchanged.    10/19/2016 Imaging    CT CAP: 1. Status post right hemicolectomy. No findings to suggest metastatic disease in the abdomen or pelvis. 2. Aortic atherosclerosis. 3. Additional incidental findings, as above.  Aortic Atherosclerosis (ICD10-I70.0).      Problem List Patient Active Problem List   Diagnosis Date Noted  . Psoriasis [L40.9] 09/12/2016  . Pulmonary nodule [R91.1] 06/14/2016  . History of colon cancer [Z85.038]   . Diarrhea [R19.7] 01/06/2016  . Cigarette nicotine dependence with nicotine-induced disorder [F17.219] 06/18/2015  . Chronic obstructive pulmonary disease ([place]) [J44.9] 06/18/2015  . Depression [F32.9] 06/18/2015  . B12 deficiency [E53.8] 04/14/2015  . Malignant neoplasm of transverse colon ([place]) [C18.4] 02/10/2015  . GERD (gastroesophageal reflux disease) [K21.9] 12/15/2014  . History of colonic polyps [Z86.010] 12/15/2014  . History of alcohol abuse [F10.11] 09/16/2014  .  Panic disorder [F41.0] 10/10/2012  . Major depressive disorder, single episode [F32.9] 10/10/2012  . Bilateral lower extremity edema [R60.0] 04/24/2012  . Hyperlipidemia [E78.5] 04/21/2009  . CAD, NATIVE VESSEL [I25.10] 03/25/2009  . HEAD TRAUMA [S09.90XA] 03/02/2009    Past Medical History Past Medical History:  Diagnosis Date  . Abnormal stress echocardiogram   . Adenocarcinoma of transverse colon ([place]) 02/10/2015  . Alcohol abuse    Heavy Use up until 2010  . Anxiety   . Blood transfusion without reported diagnosis   . COPD (chronic obstructive pulmonary disease) ([place])   . Depression   . Emphysema of lung ([place])   . GERD (gastroesophageal reflux disease)   . Head trauma 2001   closed head injury; coma for 4 weeks  . Hypercholesterolemia   . Hypertension   . ING HERN W/GANGREN RECUR UNILAT/UNSPEC ING HERN 04/21/2009   Qualifier: Diagnosis of  By: Verl Blalock, MD, Delanna Ahmadi PTSD (post-traumatic stress disorder)   . Pulmonary nodule 06/14/2016  . SAH (subarachnoid hemorrhage) ([place]) 08/31/2012  . SDH (subdural hematoma) ([place]) 08/31/2012    Past Surgical History Past Surgical History:  Procedure Laterality Date  . BIOPSY  01/25/2016   Procedure: BIOPSY;  Surgeon: Danie Binder, MD;  [facility];  Service: Endoscopy;;  ileum;   . cardiac cath    . COLONOSCOPY  2011   Dr. Oneida Alar: multiple adenomas and hyperplastic polyps  . COLONOSCOPY WITH PROPOFOL N/A 01/12/2015   Procedure: COLONOSCOPY WITH PROPOFOL;  Surgeon: Danie Binder, MD;  [facility];  Service: Endoscopy;  Laterality: N/A;  1030  . COLONOSCOPY WITH PROPOFOL N/A 01/25/2016   Procedure: COLONOSCOPY WITH PROPOFOL;  Surgeon: Danie Binder, MD;  [facility];  Service: Endoscopy;  Laterality: N/A;  1230  . CRANIOTOMY  2001  . ESOPHAGOGASTRODUODENOSCOPY (EGD) WITH PROPOFOL N/A 01/12/2015   Procedure: ESOPHAGOGASTRODUODENOSCOPY (EGD) WITH PROPOFOL;  Surgeon: Danie Binder, MD;  [facility]  [facility];  Service: Endoscopy;  Laterality: N/A;  . head injury surgery    . HERNIA REPAIR Right 2012   Inguinal- [place] Na  . KIDNEY SURGERY     >30 years ago  . LAPAROSCOPIC RIGHT HEMI COLECTOMY Left 02/10/2015   Procedure: LAPAROSCOPIC THEN OPEN RIGHT HEMI COLECTOMY;  Surgeon: [name], MD;  Location: [facility] ORS;  Service: General;  Laterality: Left;  . POLYPECTOMY  01/25/2016   Procedure: POLYPECTOMY;  Surgeon: Danie Binder, MD;  [facility];  Service: Endoscopy;;  colon  . PORT-A-CATH REMOVAL N/A 02/26/2017   Procedure: MINOR REMOVAL PORT-A-CATH;  Surgeon: Aviva Signs, MD;  Location: [facility];  Service: General;  Laterality: N/A;  Pt to arrive at [place] N/A 03/24/2015   Procedure: INSERTION PORT-A-CATH;  Surgeon: Jules Husbands, MD;  Location: [facility] ORS;  Service:  General;  Laterality: N/A;    Family History Family History  Problem Relation Age of Onset  . Pulmonary embolism Mother   . Breast cancer Mother   . Cancer Mother        breast cancer  . Arthritis Mother   . Heart disease Father   . Cancer Father 49       Leukemia  . Cancer Paternal Grandfather        Lung  . Ataxia Neg Hx   . Chorea Neg Hx   . Dementia Neg Hx   . Mental retardation Neg Hx   . Migraines Neg Hx   . Multiple sclerosis Neg Hx   . Neurofibromatosis Neg Hx   . Neuropathy Neg Hx   . Parkinsonism Neg Hx   . Seizures Neg Hx   . Stroke Neg Hx   . Colon cancer Neg Hx      Social History  reports that he has been smoking cigarettes. He started smoking about 44 years ago. He has a 3.00 pack-year smoking history. He has never used smokeless tobacco. He reports that he drinks alcohol. He reports that he does not use drugs.  Medications  Current Outpatient Medications:  .  amitriptyline (ELAVIL) 25 MG tablet, TAKE 1 TABLET BY MOUTH EVERY NIGHT AT BEDTIME (Patient taking differently: Take 25 mg by mouth at bedtime. ), Disp: 30 tablet, Rfl: 0 .  atorvastatin  (LIPITOR) 20 MG tablet, Take 1 tablet (20 mg total) by mouth at bedtime., Disp: 90 tablet, Rfl: 3 .  azithromycin (ZITHROMAX Z-PAK) 250 MG tablet, Take as directed.  2 tabs D1 and 1 a day for 4 days, Disp: 6 each, Rfl: 0 .  buPROPion (WELLBUTRIN SR) 150 MG 12 hr tablet, Take 150 mg by mouth daily. , Disp: , Rfl:  .  FLUoxetine (PROZAC) 20 MG capsule, TAKE 1 CAPSULE BY MOUTH 2 TIMES A DAY (Patient taking differently: Take 60 mg by mouth every morning. ), Disp: 60 capsule, Rfl: 0 .  gabapentin (NEURONTIN) 300 MG capsule, Take 1-3 capsules (300-900 mg total) by mouth See admin instructions. 363m in the morning and 9083mat bedtime, Disp: 120 capsule, Rfl: 1 .  loperamide (IMODIUM) 2 MG capsule, TAKE 1 TO 2 CAPSULES BY MOUTH EVERY 6 HOURS AS NEEDED FOR DIARRHEA OR LOOSE STOOLS (Patient taking differently: Take 2-4 mg by mouth as needed. ), Disp: 60 capsule, Rfl: 0 .  omeprazole (PRILOSEC) 20 MG capsule, Take 20 mg by mouth daily., Disp: , Rfl:  .  PROAIR HFA 108 (90 Base) MCG/ACT inhaler, INHALE 2 PUFFS BY MOUTH EVERY 6 HOURS AS NEEDED FOR SHORTNESS OF BREATH/WHEEZING. (Patient taking differently: Inhale 2 puffs into the lungs every 6 (six) hours as needed for wheezing or shortness of breath. ), Disp: 18 each, Rfl: 2  Allergies Patient has no known allergies.  Review of Systems Review of Systems - Oncology ROS negative other than cough and congestion.     Physical Exam  Vitals Wt Readings from Last 3 Encounters:  11/20/17 232 lb 1.6 oz (105.3 kg)  11/01/17 240 lb 3.2 oz (109 kg)  10/25/17 241 lb 1.6 oz (109.4 kg)   Temp Readings from Last 3 Encounters:  11/20/17 98 F (36.7 C) (Oral)  11/01/17 97.9 F (36.6 C) (Oral)  10/25/17 98.1 F (36.7 C) (Oral)   BP Readings from Last 3 Encounters:  11/20/17 140/77  11/01/17 (!) 158/102  10/25/17 135/82   Pulse Readings from Last 3 Encounters:  11/20/17 (!Marland Kitchen  110  11/01/17 (!) 117  10/25/17 (!) 105   Constitutional: Well-developed,  well-nourished, and in no distress.   HENT: Head: Normocephalic and atraumatic.  Mouth/Throat: No oropharyngeal exudate. Mucosa moist. Eyes: Pupils are equal, round, and reactive to light. Conjunctivae are normal. No scleral icterus.  Neck: Normal range of motion. Neck supple. No JVD present.  Cardiovascular: Normal rate, regular rhythm and normal heart sounds.  Exam reveals no gallop and no friction rub.   No murmur heard. Pulmonary/Chest: Effort normal.  Coarse BS Abdominal: Soft. Bowel sounds are normal. Mild distension.  No guarding.   Musculoskeletal: No edema or tenderness.  Lymphadenopathy: No cervical, axillary or supraclavicular adenopathy.  Neurological: Alert and oriented to person, place, and time. No cranial nerve deficit.  Skin: Skin is warm and dry. No rash noted. No erythema. No pallor.  Psychiatric: Affect and judgment normal.   Labs No visits with results within 3 Day(s) from this visit.  Latest known visit with results is:  Appointment on 11/16/2017  Component Date Value Ref Range Status  . WBC 11/16/2017 8.5  4.0 - 10.5 K/uL Final  . RBC 11/16/2017 4.86  4.22 - 5.81 MIL/uL Final  . Hemoglobin 11/16/2017 14.8  13.0 - 17.0 g/dL Final  . HCT 11/16/2017 45.4  39.0 - 52.0 % Final  . MCV 11/16/2017 93.4  80.0 - 100.0 fL Final  . MCH 11/16/2017 30.5  26.0 - 34.0 pg Final  . MCHC 11/16/2017 32.6  30.0 - 36.0 g/dL Final  . RDW 11/16/2017 13.0  11.5 - 15.5 % Final  . Platelets 11/16/2017 192  150 - 400 K/uL Final  . nRBC 11/16/2017 0.0  0.0 - 0.2 % Final  . Neutrophils Relative % 11/16/2017 76  % Final  . Neutro Abs 11/16/2017 6.4  1.7 - 7.7 K/uL Final  . Lymphocytes Relative 11/16/2017 10  % Final  . Lymphs Abs 11/16/2017 0.8  0.7 - 4.0 K/uL Final  . Monocytes Relative 11/16/2017 13  % Final  . Monocytes Absolute 11/16/2017 1.1* 0.1 - 1.0 K/uL Final  . Eosinophils Relative 11/16/2017 0  % Final  . Eosinophils Absolute 11/16/2017 0.0  0.0 - 0.5 K/uL Final  . Basophils  Relative 11/16/2017 1  % Final  . Basophils Absolute 11/16/2017 0.1  0.0 - 0.1 K/uL Final  . Immature Granulocytes 11/16/2017 0  % Final  . Abs Immature Granulocytes 11/16/2017 0.03  0.00 - 0.07 K/uL Final   Performed at [facility], [address], [place], [place] [address]  . Sodium 11/16/2017 132* 135 - 145 mmol/L Final  . Potassium 11/16/2017 3.8  3.5 - 5.1 mmol/L Final  . Chloride 11/16/2017 96* 98 - 111 mmol/L Final  . CO2 11/16/2017 25  22 - 32 mmol/L Final  . Glucose, Bld 11/16/2017 122* 70 - 99 mg/dL Final  . BUN 11/16/2017 8  6 - 20 mg/dL Final  . Creatinine, Ser 11/16/2017 0.84  0.61 - 1.24 mg/dL Final  . Calcium 11/16/2017 8.8* 8.9 - 10.3 mg/dL Final  . Total Protein 11/16/2017 8.3* 6.5 - 8.1 g/dL Final  . Albumin 11/16/2017 3.8  3.5 - 5.0 g/dL Final  . AST 11/16/2017 32  15 - 41 U/L Final  . ALT 11/16/2017 26  0 - 44 U/L Final  . Alkaline Phosphatase 11/16/2017 92  38 - 126 U/L Final  . Total Bilirubin 11/16/2017 0.7  0.3 - 1.2 mg/dL Final  . GFR calc non Af Amer 11/16/2017 >60  >60 mL/min Final  .  GFR calc Af Amer 11/16/2017 >60  >60 mL/min Final   Comment: (NOTE) The eGFR has been calculated using the CKD EPI equation. This calculation has not been validated in all clinical situations. eGFR's persistently <60 mL/min signify possible Chronic Kidney Disease.   Georgiann Hahn gap 11/16/2017 11  5 - 15 Final   Performed at [facility], [address], [place],  [address]  . CEA 11/16/2017 12.9* 0.0 - 4.7 ng/mL Final   Comment: (NOTE)                             Nonsmokers          <3.9                             Smokers             <5.6 [employer] Electrochemiluminescence Immunoassay (ECLIA) Values obtained with different assay methods or kits cannot be used interchangeably.  Results cannot be interpreted as absolute evidence of the presence or absence of malignant disease. Performed At: [facility] [place], [place]  [id number] Rush Farmer MD IR:[id number]      Pathology Orders Placed This Encounter  Procedures  . CT BIOPSY    Standing Status:   Future    Standing Expiration Date:   11/20/2018    Order Specific Question:   Lab orders requested (DO NOT place separate lab orders, these will be automatically ordered during procedure specimen collection):    Answer:   Surgical Pathology    Order Specific Question:   Reason for Exam (SYMPTOM  OR DIAGNOSIS REQUIRED)    Answer:   abnormal PET scan, omental lesion, elevated tumor marker    Order Specific Question:   Preferred imaging location?    Answer:   [facility]    Order Specific Question:   Radiology Contrast Protocol - do NOT remove file path    Answer:   [email or web address]       Zoila Shutter MD

## 2017-11-29 ENCOUNTER — Other Ambulatory Visit: Payer: Self-pay | Admitting: Radiology

## 2017-11-30 ENCOUNTER — Other Ambulatory Visit (HOSPITAL_COMMUNITY): Payer: Self-pay | Admitting: Internal Medicine

## 2017-11-30 ENCOUNTER — Ambulatory Visit (HOSPITAL_COMMUNITY)
Admission: RE | Admit: 2017-11-30 | Discharge: 2017-11-30 | Disposition: A | Discharge status: Home / Self Care | Payer: Medicaid Other | Source: Ambulatory Visit | Attending: Internal Medicine | Admitting: Internal Medicine

## 2017-11-30 ENCOUNTER — Encounter (HOSPITAL_COMMUNITY): Payer: Self-pay

## 2017-11-30 DIAGNOSIS — I700 Atherosclerosis of aorta: Secondary | ICD-10-CM | POA: Insufficient documentation

## 2017-11-30 DIAGNOSIS — F329 Major depressive disorder, single episode, unspecified: Secondary | ICD-10-CM | POA: Diagnosis not present

## 2017-11-30 DIAGNOSIS — Z79899 Other long term (current) drug therapy: Secondary | ICD-10-CM | POA: Insufficient documentation

## 2017-11-30 DIAGNOSIS — I2510 Atherosclerotic heart disease of native coronary artery without angina pectoris: Secondary | ICD-10-CM | POA: Insufficient documentation

## 2017-11-30 DIAGNOSIS — K219 Gastro-esophageal reflux disease without esophagitis: Secondary | ICD-10-CM | POA: Insufficient documentation

## 2017-11-30 DIAGNOSIS — Z8249 Family history of ischemic heart disease and other diseases of the circulatory system: Secondary | ICD-10-CM | POA: Insufficient documentation

## 2017-11-30 DIAGNOSIS — F4310 Post-traumatic stress disorder, unspecified: Secondary | ICD-10-CM | POA: Insufficient documentation

## 2017-11-30 DIAGNOSIS — R9389 Abnormal findings on diagnostic imaging of other specified body structures: Secondary | ICD-10-CM | POA: Diagnosis present

## 2017-11-30 DIAGNOSIS — E7800 Pure hypercholesterolemia, unspecified: Secondary | ICD-10-CM | POA: Diagnosis not present

## 2017-11-30 DIAGNOSIS — N400 Enlarged prostate without lower urinary tract symptoms: Secondary | ICD-10-CM | POA: Insufficient documentation

## 2017-11-30 DIAGNOSIS — I10 Essential (primary) hypertension: Secondary | ICD-10-CM | POA: Diagnosis not present

## 2017-11-30 DIAGNOSIS — F17210 Nicotine dependence, cigarettes, uncomplicated: Secondary | ICD-10-CM | POA: Diagnosis not present

## 2017-11-30 DIAGNOSIS — J439 Emphysema, unspecified: Secondary | ICD-10-CM | POA: Insufficient documentation

## 2017-11-30 DIAGNOSIS — C184 Malignant neoplasm of transverse colon: Secondary | ICD-10-CM | POA: Diagnosis present

## 2017-11-30 DIAGNOSIS — N200 Calculus of kidney: Secondary | ICD-10-CM | POA: Diagnosis not present

## 2017-11-30 LAB — CBC
HEMATOCRIT: 44.9 % (ref 39.0–52.0)
Hemoglobin: 14.1 g/dL (ref 13.0–17.0)
MCH: 30.4 pg (ref 26.0–34.0)
MCHC: 31.4 g/dL (ref 30.0–36.0)
MCV: 96.8 fL (ref 80.0–100.0)
NRBC: 0 % (ref 0.0–0.2)
Platelets: 314 10*3/uL (ref 150–400)
RBC: 4.64 MIL/uL (ref 4.22–5.81)
RDW: 13.3 % (ref 11.5–15.5)
WBC: 12 10*3/uL — AB (ref 4.0–10.5)

## 2017-11-30 LAB — PROTIME-INR
INR: 1.09
Prothrombin Time: 14 seconds (ref 11.4–15.2)

## 2017-11-30 MED ORDER — FENTANYL CITRATE (PF) 100 MCG/2ML IJ SOLN
INTRAMUSCULAR | Status: AC
  Filled 2017-11-30: qty 2

## 2017-11-30 MED ORDER — LIDOCAINE HCL 1 % IJ SOLN
INTRAMUSCULAR | Status: AC
  Filled 2017-11-30: qty 20

## 2017-11-30 MED ORDER — MIDAZOLAM HCL 2 MG/2ML IJ SOLN
INTRAMUSCULAR | Status: AC | PRN
  Administered 2017-11-30: 1 mg via INTRAVENOUS
  Administered 2017-11-30 (×2): 0.5 mg via INTRAVENOUS

## 2017-11-30 MED ORDER — MIDAZOLAM HCL 2 MG/2ML IJ SOLN
INTRAMUSCULAR | Status: AC
  Filled 2017-11-30: qty 2

## 2017-11-30 MED ORDER — FENTANYL CITRATE (PF) 100 MCG/2ML IJ SOLN
INTRAMUSCULAR | Status: AC | PRN
  Administered 2017-11-30 (×2): 25 ug via INTRAVENOUS

## 2017-11-30 MED ORDER — SODIUM CHLORIDE 0.9 % IV SOLN: INTRAVENOUS | Status: DC

## 2017-11-30 NOTE — H&P (Signed)
Chief Complaint: Patient was seen in consultation today for omental/peritoneal nodule biopsy at the request of Higgs,Vetta  Referring Physician(s): Higgs,Vetta  Supervising Physician: Sandi Mariscal  Patient Status: [facility] - Out-pt  History of Present Illness: Erik [name] is a 62 y.o. male   Hx Colon Ca 2016 Rt Hemicolectomy1/2017 and chemo  Follow up scan for Oncology 10/23/17:  IMPRESSION: 1. Subtle but new nodularity along the upper omentum along with nodular density in the pelvic mesentery adjacent to the sigmoid colon, concerning for peritoneal and omental implants of tumor. 2. Mild wall thickening in the sigmoid colon could reflect a low-grade colitis. 3. Trace perihepatic ascites. 4. Other imaging findings of potential clinical significance: Aortic Atherosclerosis (ICD10-I70.0) and Emphysema (ICD10-J43.9). Coronary atherosclerosis. Airway thickening is present, suggesting bronchitis or reactive airways disease. Degenerative right glenohumeral arthropathy with free osteochondral fragment in the subscapular recess. Nonobstructive single bilateral punctate renal calculi. Prior right hemicolectomy. Mild prostatomegaly. Mild impingement at L4-5 and L5-S1.  Pt was recommended for PET scan which was done on 11/19/2017 that showed  IMPRESSION: 1. Similar to 10/23/2017 is progressive peritoneal nodularity worrisome for peritoneal carcinomatosis. This appears new when compared with 10/19/2016. 2. Increase in size of right paratracheal lymph node with mild FDG uptake. Nonspecific. There is also mild nonspecific increased uptake within the right hilar region. 3. Reticulonodular densities identified within both lungs with an upper lobe predominance are favored to represent postinflammatory changes. 4. Aortic Atherosclerosis (ICD10-I70.0) and Emphysema (ICD10-J43.9). 5. Coronary artery atherosclerotic calcifications.  Now scheduled for omental/peritoneal nodule  biopsy     Past Medical History:  Diagnosis Date  . Abnormal stress echocardiogram   . Adenocarcinoma of transverse colon ([place]) 02/10/2015  . Alcohol abuse    Heavy Use up until 2010  . Anxiety   . Blood transfusion without reported diagnosis   . COPD (chronic obstructive pulmonary disease) ([place])   . Depression   . Emphysema of lung ([place])   . GERD (gastroesophageal reflux disease)   . Head trauma 2001   closed head injury; coma for 4 weeks  . Hypercholesterolemia   . Hypertension   . ING HERN W/GANGREN RECUR UNILAT/UNSPEC ING HERN 04/21/2009   Qualifier: Diagnosis of  By: Verl Blalock, MD, Delanna Ahmadi PTSD (post-traumatic stress disorder)   . Pulmonary nodule 06/14/2016  . SAH (subarachnoid hemorrhage) ([place]) 08/31/2012  . SDH (subdural hematoma) ([place]) 08/31/2012    Past Surgical History:  Procedure Laterality Date  . BIOPSY  01/25/2016   Procedure: BIOPSY;  Surgeon: Danie Binder, MD;  [facility];  Service: Endoscopy;;  ileum;   . cardiac cath    . COLONOSCOPY  2011   Dr. Oneida Alar: multiple adenomas and hyperplastic polyps  . COLONOSCOPY WITH PROPOFOL N/A 01/12/2015   Procedure: COLONOSCOPY WITH PROPOFOL;  Surgeon: Danie Binder, MD;  Location: [facility];  Service: Endoscopy;  Laterality: N/A;  1030  . COLONOSCOPY WITH PROPOFOL N/A 01/25/2016   Procedure: COLONOSCOPY WITH PROPOFOL;  Surgeon: Danie Binder, MD;  [facility]: [facility];  Service: Endoscopy;  Laterality: N/A;  1230  . CRANIOTOMY  2001  . ESOPHAGOGASTRODUODENOSCOPY (EGD) WITH PROPOFOL N/A 01/12/2015   Procedure: ESOPHAGOGASTRODUODENOSCOPY (EGD) WITH PROPOFOL;  Surgeon: Danie Binder, MD;  [facility];  Service: Endoscopy;  Laterality: N/A;  . head injury surgery    . HERNIA REPAIR Right 2012   Inguinal- [place] Na  . KIDNEY SURGERY     >30 years ago  .  LAPAROSCOPIC RIGHT HEMI COLECTOMY Left 02/10/2015   Procedure: LAPAROSCOPIC THEN OPEN RIGHT HEMI COLECTOMY;  Surgeon: [name],  MD;  Location: [facility] ORS;  Service: General;  Laterality: Left;  . POLYPECTOMY  01/25/2016   Procedure: POLYPECTOMY;  Surgeon: Danie Binder, MD;  Location: [facility];  Service: Endoscopy;;  colon  . PORT-A-CATH REMOVAL N/A 02/26/2017   Procedure: MINOR REMOVAL PORT-A-CATH;  Surgeon: Aviva Signs, MD;  Location: [facility] ORS;  Service: General;  Laterality: N/A;  Pt to arrive at [place] N/A 03/24/2015   Procedure: INSERTION PORT-A-CATH;  Surgeon: Jules Husbands, MD;  Location: [facility] ORS;  Service: General;  Laterality: N/A;    Allergies: Patient has no known allergies.  Medications: Prior to Admission medications   Medication Sig Start Date End Date Taking? Authorizing Provider  amitriptyline (ELAVIL) 25 MG tablet TAKE 1 TABLET BY MOUTH EVERY NIGHT AT BEDTIME Patient taking differently: Take 25 mg by mouth at bedtime.  06/22/17  Yes Derek Jack, MD  atorvastatin (LIPITOR) 20 MG tablet Take 1 tablet (20 mg total) by mouth at bedtime. Patient taking differently: Take 20 mg by mouth every morning.  01/02/17  Yes Raylene Everts, MD  azithromycin (ZITHROMAX Z-PAK) 250 MG tablet Take as directed.  2 tabs D1 and 1 a day for 4 days 11/20/17  Yes Higgs, Mathis Dad, MD  buPROPion (WELLBUTRIN SR) 150 MG 12 hr tablet Take 150 mg by mouth daily.    Yes [provider]  FLUoxetine (PROZAC) 20 MG capsule TAKE 1 CAPSULE BY MOUTH 2 TIMES A DAY Patient taking differently: Take 60 mg by mouth every morning.  06/14/16  Yes [name], PA-C  gabapentin (NEURONTIN) 300 MG capsule Take 1-3 capsules (300-900 mg total) by mouth See admin instructions. 300mg  in the morning and 900mg  at bedtime 11/20/17  Yes Higgs, Mathis Dad, MD  loperamide (IMODIUM) 2 MG capsule TAKE 1 TO 2 CAPSULES BY MOUTH EVERY 6 HOURS AS NEEDED FOR DIARRHEA OR LOOSE STOOLS Patient taking differently: Take 2-4 mg by mouth as needed.  06/22/17  Yes Derek Jack, MD  omeprazole (PRILOSEC) 20 MG capsule Take  20 mg by mouth daily.   Yes [provider]  PROAIR HFA 108 (90 Base) MCG/ACT inhaler INHALE 2 PUFFS BY MOUTH EVERY 6 HOURS AS NEEDED FOR SHORTNESS OF BREATH/WHEEZING. Patient taking differently: Inhale 2 puffs into the lungs every 6 (six) hours as needed for wheezing or shortness of breath.  02/13/17  Yes Raylene Everts, MD     Family History  Problem Relation Age of Onset  . Pulmonary embolism Mother   . Breast cancer Mother   . Cancer Mother        breast cancer  . Arthritis Mother   . Heart disease Father   . Cancer Father 54       Leukemia  . Cancer Paternal Grandfather        Lung  . Ataxia Neg Hx   . Chorea Neg Hx   . Dementia Neg Hx   . Mental retardation Neg Hx   . Migraines Neg Hx   . Multiple sclerosis Neg Hx   . Neurofibromatosis Neg Hx   . Neuropathy Neg Hx   . Parkinsonism Neg Hx   . Seizures Neg Hx   . Stroke Neg Hx   . Colon cancer Neg Hx     Social History   Socioeconomic History  . Marital status: Divorced    Spouse name: Not on file  .  Number of children: 2  . Years of education: 62  . Highest education level: Not on file  Occupational History  . Occupation: disabled/unemployed  Social Needs  . Financial resource strain: Not on file  . Food insecurity:    Worry: Not on file    Inability: Not on file  . Transportation needs:    Medical: Not on file    Non-medical: Not on file  Tobacco Use  . Smoking status: Current Some Day Smoker    Packs/day: 0.10    Years: 30.00    Pack years: 3.00    Types: Cigarettes    Start date: 02/06/1973  . Smokeless tobacco: Never Used  . Tobacco comment: smokes every 2-3 days, 2-3 a day when smokes  Substance and Sexual Activity  . Alcohol use: Yes    Alcohol/week: 0.0 standard drinks    Comment: beer occ, history of ETOH abuse in remote past.   . Drug use: No    Types: Marijuana    Comment: marijuana last 2012  . Sexual activity: Never    Partners: Female    Birth control/protection: None   Lifestyle  . Physical activity:    Days per week: Not on file    Minutes per session: Not on file  . Stress: Not on file  Relationships  . Social connections:    Talks on phone: Not on file    Gets together: Not on file    Attends religious service: Not on file    Active member of club or organization: Not on file    Attends meetings of clubs or organizations: Not on file    Relationship status: Not on file  Other Topics Concern  . Not on file  Social History Narrative   Single   Lives alone   Turned down for disability    Review of Systems: A 12 point ROS discussed and pertinent positives are indicated in the HPI above.  All other systems are negative.  Review of Systems  Constitutional: Negative for activity change and fatigue.  Respiratory: Negative for cough and shortness of breath.   Cardiovascular: Negative for chest pain.  Gastrointestinal: Negative for abdominal pain.  Musculoskeletal: Positive for gait problem.  Neurological: Positive for weakness.  Psychiatric/Behavioral: Negative for behavioral problems and confusion.    Vital Signs: BP 122/90   Pulse (!) 109   Temp 99 F (37.2 C)   Resp 18   Ht 6\' 2"  (1.88 m)   Wt 240 lb (108.9 kg)   SpO2 92%   BMI 30.81 kg/m   Physical Exam  Constitutional: He is oriented to person, place, and time.  Cardiovascular: Normal rate, regular rhythm and normal heart sounds.  Pulmonary/Chest: Effort normal and breath sounds normal.  Abdominal: Soft. Bowel sounds are normal.  Musculoskeletal: Normal range of motion.  Neurological: He is alert and oriented to person, place, and time.  Skin: Skin is warm and dry.  Psychiatric: He has a normal mood and affect. His behavior is normal. Judgment and thought content normal.  Vitals reviewed.   Imaging: Nm Pet Image Initial (pi) Skull Base To Thigh  Result Date: 11/20/2017 CLINICAL DATA:  Subsequent treatment strategy for colon cancer. EXAM: NUCLEAR MEDICINE PET SKULL BASE  TO THIGH TECHNIQUE: 14.58 mCi F-18 FDG was injected intravenously. Full-ring PET imaging was performed from the skull base to thigh after the radiotracer. CT data was obtained and used for attenuation correction and anatomic localization. Fasting blood glucose: 110 mg/dl COMPARISON:  10/23/2017.  FINDINGS: Mediastinal blood pool activity: SUV max 2.93 NECK: No hypermetabolic lymph nodes in the neck. Incidental CT findings: none CHEST: Right paratracheal lymph node measures 9 mm and has an SUV max of 4.25. On 04/13/2016 this measured 7 mm. Asymmetric uptake within the right hilum has an SUV max of 4.53. No pleural effusion. Mild changes of emphysema. No hypermetabolic pulmonary nodules identified. Incidental CT findings: Aortic atherosclerosis. Calcifications in the LAD coronary artery noted. No pleural effusions. Emphysema. Bilateral upper lobe predominant reticulonodular densities are again identified. These nodules are too small to characterize with an appearance favored to represent chronic postinflammatory changes. Similar to recent chest CT. ABDOMEN/PELVIS: No abnormal radiotracer activity identified within the liver, pancreas, or spleen. No abnormal uptake within the adrenal glands. No hypermetabolic lymph nodes. Mild peritoneal nodularity is again identified, concerning for peritoneal carcinomatosis. Peritoneal nodules are largely too small to reliably characterize by PET-CT. Within the central pelvis there is a peritoneal nodule measuring 1.3 cm within SUV max of 1.88. Similar to 10/23/2017. New from 10/19/2016. Incidental CT findings: No ascites. SKELETON: No focal hypermetabolic activity to suggest skeletal metastasis. Incidental CT findings: none IMPRESSION: 1. Similar to 10/23/2017 is progressive peritoneal nodularity worrisome for peritoneal carcinomatosis. This appears new when compared with 10/19/2016. 2. Increase in size of right paratracheal lymph node with mild FDG uptake. Nonspecific. There is also  mild nonspecific increased uptake within the right hilar region. 3. Reticulonodular densities identified within both lungs with an upper lobe predominance are favored to represent postinflammatory changes. 4. Aortic Atherosclerosis (ICD10-I70.0) and Emphysema (ICD10-J43.9). 5. Coronary artery atherosclerotic calcifications. Electronically Signed   By: Kerby Moors M.D.   On: 11/20/2017 09:02    Labs:  CBC: Recent Labs    10/10/17 1534 10/22/17 1044 11/16/17 1051 11/30/17 1140  WBC 8.6 8.3 8.5 12.0*  HGB 14.2 14.1 14.8 14.1  HCT 42.0 42.3 45.4 44.9  PLT 256 185 192 314    COAGS: Recent Labs    11/30/17 1140  INR 1.09    BMP: Recent Labs    07/23/17 1004 10/10/17 1534 10/22/17 1044 11/16/17 1051  NA 141 138 138 132*  K 4.6 4.2 3.9 3.8  CL 103 100 100 96*  CO2 29 28 30 25   GLUCOSE 89 103* 103* 122*  BUN 14 12 10 8   CALCIUM 9.0 9.1 9.1 8.8*  CREATININE 0.95 0.75 0.88 0.84  GFRNONAA >60 >60 >60 >60  GFRAA >60 >60 >60 >60    LIVER FUNCTION TESTS: Recent Labs    07/23/17 1004 10/10/17 1534 10/22/17 1044 11/16/17 1051  BILITOT 0.6 0.7 0.7 0.7  AST 21 26 16  32  ALT 18 32 13 26  ALKPHOS 64 103 61 92  PROT 7.3 7.5 7.2 8.3*  ALBUMIN 3.8 3.8 3.7 3.8    TUMOR MARKERS: No results for input(s): AFPTM, CEA, CA199, CHROMGRNA in the last 8760 hours.  Assessment and Plan:  Hx colon cancer 2016 Hemicolectomy and chemo Follow up CT reveals omental/peritoneal nodule +PET Now scheduled for biopsy of same Risks and benefits discussed with the patient including, but not limited to bleeding, infection, damage to adjacent structures or low yield requiring additional tests.  All of the patient's questions were answered, patient is agreeable to proceed. Consent signed and in chart.   Thank you for this interesting consult.  I greatly enjoyed meeting Erik [name] and look forward to participating in their care.  A copy of this report was sent to the requesting  provider on this  date.  Electronically Signed: Lennyx Verdell A, PA-C 11/30/2017, 12:22 PM   I spent a total of  30 Minutes   in face to face in clinical consultation, greater than 50% of which was counseling/coordinating care for peritoneal nodule bx

## 2017-11-30 NOTE — Discharge Instructions (Signed)
Needle Biopsy, Care After Refer to this sheet in the next few weeks. These instructions provide you with information about caring for yourself after your procedure. Your health care provider may also give you more specific instructions. Your treatment has been planned according to current medical practices, but problems sometimes occur. Call your health care provider if you have any problems or questions after your procedure. What can I expect after the procedure? After your procedure, it is common to have soreness, bruising, or mild pain at the biopsy site. This should go away in a few days. Follow these instructions at home:  Rest as directed by your health care provider.  Take medicines only as directed by your health care provider.  There are many different ways to close and cover the biopsy site, including stitches (sutures), skin glue, and adhesive strips. Follow your health care provider's instructions about: ? Bandage (dressing) removal in 24 hours ? After 24 hours you may shower.  Check your biopsy site every day for signs of infection. Watch for: ? Redness, swelling, or pain. ? Fluid, blood, or pus. Contact a health care provider if:  You have a fever.  You have redness, swelling, or pain at the biopsy site that lasts longer than a few days.  You have fluid, blood, or pus coming from the biopsy site.  You feel nauseous.  You vomit. Get help right away if:  You have shortness of breath.  You have trouble breathing.  You have chest pain.  You feel dizzy or you faint.  You have bleeding that does not stop with pressure or a bandage.  You cough up blood.  You have pain in your abdomen. This information is not intended to replace advice given to you by your health care provider. Make sure you discuss any questions you have with your health care provider. Document Released: 06/09/2014 Document Revised: 07/01/2015 Document Reviewed: 01/19/2014 Elsevier Interactive  Patient Education  2018 [place]. Moderate Conscious Sedation, Adult, Care After These instructions provide you with information about caring for yourself after your procedure. Your health care provider may also give you more specific instructions. Your treatment has been planned according to current medical practices, but problems sometimes occur. Call your health care provider if you have any problems or questions after your procedure. What can I expect after the procedure? After your procedure, it is common:  To feel sleepy for several hours.  To feel clumsy and have poor balance for several hours.  To have poor judgment for several hours.  To vomit if you eat too soon.  Follow these instructions at home: For at least 24 hours after the procedure:   Do not: ? Participate in activities where you could fall or become injured. ? Drive. ? Use heavy machinery. ? Drink alcohol. ? Take sleeping pills or medicines that cause drowsiness. ? Make important decisions or sign legal documents. ? Take care of children on your own.  Rest. Eating and drinking  Follow the diet recommended by your health care provider.  If you vomit: ? Drink water, juice, or soup when you can drink without vomiting. ? Make sure you have little or no nausea before eating solid foods. General instructions  Have a responsible adult stay with you until you are awake and alert.  Take over-the-counter and prescription medicines only as told by your health care provider.  If you smoke, do not smoke without supervision.  Keep all follow-up visits as told by your health care provider.  This is important. Contact a health care provider if:  You keep feeling nauseous or you keep vomiting.  You feel light-headed.  You develop a rash.  You have a fever. Get help right away if:  You have trouble breathing. This information is not intended to replace advice given to you by your health care provider. Make  sure you discuss any questions you have with your health care provider. Document Released: 11/13/2012 Document Revised: 06/28/2015 Document Reviewed: 05/15/2015 Elsevier Interactive Patient Education  [employer].

## 2017-11-30 NOTE — Procedures (Signed)
Pre Procedure Dx: Omental nodule Post Procedural Dx: Same  Attempted though unsuccessful CT/US guided biopsy of indeterminate nodule within the anterior aspect of the left upper abdomen.   EBL: None  No immediate complications.   Ronny Bacon, MD Pager #: [phone]

## 2017-12-05 ENCOUNTER — Inpatient Hospital Stay (HOSPITAL_BASED_OUTPATIENT_CLINIC_OR_DEPARTMENT_OTHER): Payer: Medicaid Other | Admitting: Internal Medicine

## 2017-12-05 ENCOUNTER — Encounter (HOSPITAL_COMMUNITY): Payer: Self-pay | Admitting: Internal Medicine

## 2017-12-05 ENCOUNTER — Other Ambulatory Visit: Payer: Self-pay

## 2017-12-05 VITALS — BP 133/78 | HR 103 | Temp 98.0°F | Resp 20 | Wt 238.0 lb

## 2017-12-05 DIAGNOSIS — R0989 Other specified symptoms and signs involving the circulatory and respiratory systems: Secondary | ICD-10-CM

## 2017-12-05 DIAGNOSIS — R911 Solitary pulmonary nodule: Secondary | ICD-10-CM | POA: Diagnosis not present

## 2017-12-05 DIAGNOSIS — Z85038 Personal history of other malignant neoplasm of large intestine: Secondary | ICD-10-CM | POA: Diagnosis not present

## 2017-12-05 DIAGNOSIS — I10 Essential (primary) hypertension: Secondary | ICD-10-CM | POA: Diagnosis not present

## 2017-12-05 DIAGNOSIS — C184 Malignant neoplasm of transverse colon: Secondary | ICD-10-CM

## 2017-12-05 DIAGNOSIS — F17210 Nicotine dependence, cigarettes, uncomplicated: Secondary | ICD-10-CM

## 2017-12-05 DIAGNOSIS — G620 Drug-induced polyneuropathy: Secondary | ICD-10-CM | POA: Diagnosis not present

## 2017-12-05 DIAGNOSIS — T451X5A Adverse effect of antineoplastic and immunosuppressive drugs, initial encounter: Secondary | ICD-10-CM

## 2017-12-05 DIAGNOSIS — R05 Cough: Secondary | ICD-10-CM

## 2017-12-05 NOTE — Progress Notes (Signed)
Diagnosis No diagnosis found.  Staging Cancer Staging Malignant neoplasm of transverse colon [facility]) Staging form: Colon and Rectum, AJCC 7th Edition - Pathologic stage from 03/04/2015: Stage IIIC (T4a, N2a, cM0) - Signed by Baird Cancer, PA-C on 03/04/2015   Assessment and Plan:  1. Stage IIIC adenocarcinoma of transverse colon.  62 yr old male previously followed by Dr. Talbert Cage.  He was diagnosed in 01/2015 on screening colonoscopy.  CEA elevated at time of diagnosis at 6.6. Treated with definitve surgery with right hemicolectomy by Dr. Adonis Huguenin on 02/12/15. Went on to have adjuvant chemo with FOLFOX x 12 cycles; chemo course was complicated by progressive peripheral neuropathy and thus Oxaliplatin was dose-reduced for cycles #6-11, and then d/c'd for cycle #12.   Pt had colonoscopy in 01/2016 with Dr. Oneida Alar. Path revealed 1 hyperplastic polyp.  No evidence of malignancy.    Pt was recommended for CT imaging but has not had that performed due to inability to get scan approved.    CT chest, abdomen and pelvis was recommended  as his last imaging was in 10/2016 for ongoing assessment due to symptoms.    Pt had CT CAP done 10/23/2017 showed  IMPRESSION: 1. Subtle but new nodularity along the upper omentum along with nodular density in the pelvic mesentery adjacent to the sigmoid colon, concerning for peritoneal and omental implants of tumor. 2. Mild wall thickening in the sigmoid colon could reflect a low-grade colitis. 3. Trace perihepatic ascites. 4. Other imaging findings of potential clinical significance: Aortic Atherosclerosis (ICD10-I70.0) and Emphysema (ICD10-J43.9). Coronary atherosclerosis. Airway thickening is present, suggesting bronchitis or reactive airways disease. Degenerative right glenohumeral arthropathy with free osteochondral fragment in the subscapular recess. Nonobstructive single bilateral punctate renal calculi. Prior right hemicolectomy. Mild prostatomegaly. Mild  impingement at L4-5 and L5-S1.  Pt was recommended for PET scan which was done on 11/19/2017 that showed  IMPRESSION: 1. Similar to 10/23/2017 is progressive peritoneal nodularity worrisome for peritoneal carcinomatosis. This appears new when compared with 10/19/2016. 2. Increase in size of right paratracheal lymph node with mild FDG uptake. Nonspecific. There is also mild nonspecific increased uptake within the right hilar region. 3. Reticulonodular densities identified within both lungs with an upper lobe predominance are favored to represent postinflammatory changes. 4. Aortic Atherosclerosis (ICD10-I70.0) and Emphysema (ICD10-J43.9). 5. Coronary artery atherosclerotic calcifications.  Labs done 11/16/2017 reviewed and showed WBC 8.5 HB 14.8 and plts 192,000.  Chemistries WNL with K+ 3.8 Cr 0.84 and normal LFTs  CEA elevated at 12.9.  Previously, Scan discussed with Dr. Oneida Alar and IR.  Dr. Markus Daft  reviewed CT scan and pt was referred to IR for review for possible CT biopsy of omental lesions.   Biopsy was attempted on 11/30/2017 but was unsuccessful.  I have discussed with pt he will be referred to surgery for evaluation to determine if biopsy possible due to suspicion of recurrent malignancy.   He will be seen for follow-up  after surgical evaluation.    2.  LUL pulmonary nodule/ Right paratracheal LN.  Pt had 6 mm nodule on scan done 04/2016.  CT chest done 10/23/2017 showed stable nodule.  Pet scan done 11/19/2017 shows paratracheal LN is nonspecific and measures 9 mm.    3.  Cough and congestion.  Pt is a smoker.  He was recently treated with Z pac.  Smoking cessation recommended.   4  HTN.  BP is 133/78,   Follow-up with PCP.   5.  Smoking.  Cessation is recommended.  6.  Neuropathy.  Continue neurontin.    25 minutes spent with more than 50% spent in counseling and coordination of care.    Interval History:  From [name], PA-C's last note on 06/14/16)    Current  Status:  Pt is seen today for follow-up.  He had recent biopsy attempt.   Oncology History   Stage IIIC [facility]) adenocarcinoma of transverse colon, diagnosed on colonoscopy by Dr. Oneida Alar on 01/12/2015 followed by definitive surgery by Dr. [name] with right hemicolectomy on 02/12/2015.  He then underwent FOLFOX x 12 cycles in the adjuvant setting (03/30/2015- 09/06/2015).     Malignant neoplasm of transverse colon ([place])   01/12/2015 Pathologic Stage    Colon, biopsy, distal transverse - TUBULOVILLOUS ADENOMA WITH HIGH GRADE DYSPLASIA.    01/12/2015 Procedure    Colonoscopy by Dr. Oneida Alar.    01/12/2015 Tumor Marker    CEA: 6.6 (H)     01/18/2015 Imaging    CT abd/pelvis- Apple-core lesion identified in the mid transverse colon without obstruction. No evidence for lymphadenopathy in the gastrohepatic ligament or omentum.  Stable 8 mm hypo attenuating lesion in the left liver, likely a cyst.    02/10/2015 Initial Diagnosis    Adenocarcinoma of transverse colon ([place])    02/12/2015 Definitive Surgery    [name], Extended right hemicolectomy     02/12/2015 Pathology Results    Mucinous adenocarcinoma with penetration of visceral peritoneum, 4/19 lymph nodes for metastatic disease, negative resection margins, with LVI and perineural invasion    03/30/2015 - 09/06/2015 Chemotherapy    FOLFOX x 12 cycles    05/25/2015 Treatment Plan Change    5 FU bolus discontinued for cycle #5    06/08/2015 Treatment Plan Change    Treatment deferred x 1 week    06/15/2015 Treatment Plan Change    5FU CI decreased by 10% and Oxaliplatin reduced by 15% for cycles #6-#11; Oxaliplatin dropped for cycle #12 d/t neuropathy.     10/20/2015 Imaging    CT CAP- Right hemicolectomy without evidence of metastatic disease. 2. Previously measured ground-glass lesion in the left upper lobe has resolved. 3. Probable food debris in the stomach, simulating gastric wall thickening. Please correlate  clinically. 4. 6 mm irregular nodular density in the left upper lobe, stable. Continued attention on followup exams is warranted.    01/25/2016 Procedure    Colonoscopy by Dr. Oneida Alar- Non-thrombosed external hemorrhoids found on digital rectal exam. - One 4 mm polyp in the rectum, removed with a cold biopsy forceps. Resected and retrieved. - Congested mucosa in the neo-terminal ileum. Biopsied. - Redundant colon. - Internal hemorrhoids.    01/26/2016 Pathology Results    1. Terminal ileum, biopsy - MILD ACUTE (ACTIVE) ILEITIS. - NO DYSPLASIA OR MALIGNANCY IDENTIFIED - SEE COMMENT. 2. Rectum, polyp(s) - HYPERPLASTIC POLYP (X 1). - NO DYSPLASIA OR MALIGNANCY IDENTIFIED.    04/13/2016 Imaging    CT chest- Stable CT chest. 6 mm irregular nodule anterior left upper lobe is stable. The scattered areas of peribronchovascular micro nodularity in the lungs bilaterally are unchanged.    10/19/2016 Imaging    CT CAP: 1. Status post right hemicolectomy. No findings to suggest metastatic disease in the abdomen or pelvis. 2. Aortic atherosclerosis. 3. Additional incidental findings, as above. Aortic Atherosclerosis (ICD10-I70.0).      Problem List Patient Active Problem List   Diagnosis Date Noted  . Psoriasis [L40.9] 09/12/2016  . Pulmonary nodule [R91.1] 06/14/2016  . History of colon cancer [  Z85.038]   . Diarrhea [R19.7] 01/06/2016  . Cigarette nicotine dependence with nicotine-induced disorder [F17.219] 06/18/2015  . Chronic obstructive pulmonary disease ([place]) [J44.9] 06/18/2015  . Depression [F32.9] 06/18/2015  . B12 deficiency [E53.8] 04/14/2015  . Malignant neoplasm of transverse colon ([place]) [C18.4] 02/10/2015  . GERD (gastroesophageal reflux disease) [K21.9] 12/15/2014  . History of colonic polyps [Z86.010] 12/15/2014  . History of alcohol abuse [F10.11] 09/16/2014  . Panic disorder [F41.0] 10/10/2012  . Major depressive disorder, single episode [F32.9] 10/10/2012  .  Bilateral lower extremity edema [R60.0] 04/24/2012  . Hyperlipidemia [E78.5] 04/21/2009  . CAD, NATIVE VESSEL [I25.10] 03/25/2009  . HEAD TRAUMA [S09.90XA] 03/02/2009    Past Medical History Past Medical History:  Diagnosis Date  . Abnormal stress echocardiogram   . Adenocarcinoma of transverse colon ([place]) 02/10/2015  . Alcohol abuse    Heavy Use up until 2010  . Anxiety   . Blood transfusion without reported diagnosis   . COPD (chronic obstructive pulmonary disease) ([place])   . Depression   . Emphysema of lung ([place])   . GERD (gastroesophageal reflux disease)   . Head trauma 2001   closed head injury; coma for 4 weeks  . Hypercholesterolemia   . Hypertension   . ING HERN W/GANGREN RECUR UNILAT/UNSPEC ING HERN 04/21/2009   Qualifier: Diagnosis of  By: Verl Blalock, MD, Delanna Ahmadi PTSD (post-traumatic stress disorder)   . Pulmonary nodule 06/14/2016  . SAH (subarachnoid hemorrhage) ([place]) 08/31/2012  . SDH (subdural hematoma) ([place]) 08/31/2012    Past Surgical History Past Surgical History:  Procedure Laterality Date  . BIOPSY  01/25/2016   Procedure: BIOPSY;  Surgeon: Danie Binder, MD;  [facility];  Service: Endoscopy;;  ileum;   . cardiac cath    . COLONOSCOPY  2011   Dr. Oneida Alar: multiple adenomas and hyperplastic polyps  . COLONOSCOPY WITH PROPOFOL N/A 01/12/2015   Procedure: COLONOSCOPY WITH PROPOFOL;  Surgeon: Danie Binder, MD;  [facility];  Service: Endoscopy;  Laterality: N/A;  1030  . COLONOSCOPY WITH PROPOFOL N/A 01/25/2016   Procedure: COLONOSCOPY WITH PROPOFOL;  Surgeon: Danie Binder, MD;  [facility];  Service: Endoscopy;  Laterality: N/A;  1230  . CRANIOTOMY  2001  . ESOPHAGOGASTRODUODENOSCOPY (EGD) WITH PROPOFOL N/A 01/12/2015   Procedure: ESOPHAGOGASTRODUODENOSCOPY (EGD) WITH PROPOFOL;  Surgeon: Danie Binder, MD;  [facility];  Service: Endoscopy;  Laterality: N/A;  . head injury surgery    . HERNIA REPAIR Right  2012   Inguinal- [place] Na  . KIDNEY SURGERY     >30 years ago  . LAPAROSCOPIC RIGHT HEMI COLECTOMY Left 02/10/2015   Procedure: LAPAROSCOPIC THEN OPEN RIGHT HEMI COLECTOMY;  Surgeon: [name], MD;  Location: [facility];  Service: General;  Laterality: Left;  . POLYPECTOMY  01/25/2016   Procedure: POLYPECTOMY;  Surgeon: Danie Binder, MD;  [facility];  Service: Endoscopy;;  colon  . PORT-A-CATH REMOVAL N/A 02/26/2017   Procedure: MINOR REMOVAL PORT-A-CATH;  Surgeon: Aviva Signs, MD;  Location: [facility];  Service: General;  Laterality: N/A;  Pt to arrive at 8:00am  . PORTACATH PLACEMENT N/A 03/24/2015   Procedure: INSERTION PORT-A-CATH;  Surgeon: Jules Husbands, MD;  Location: [facility] OR[facility];  Service: General;  Laterality: N/A;    Family History Family History  Problem Relation Age of Onset  . Pulmonary embolism Mother   . Breast cancer Mother   . Cancer Mother  breast cancer  . Arthritis Mother   . Heart disease Father   . Cancer Father 104       Leukemia  . Cancer Paternal Grandfather        Lung  . Ataxia Neg Hx   . Chorea Neg Hx   . Dementia Neg Hx   . Mental retardation Neg Hx   . Migraines Neg Hx   . Multiple sclerosis Neg Hx   . Neurofibromatosis Neg Hx   . Neuropathy Neg Hx   . Parkinsonism Neg Hx   . Seizures Neg Hx   . Stroke Neg Hx   . Colon cancer Neg Hx      Social History  reports that he has been smoking cigarettes. He started smoking about 44 years ago. He has a 3.00 pack-year smoking history. He has never used smokeless tobacco. He reports that he drinks alcohol. He reports that he does not use drugs.  Medications  Current Outpatient Medications:  .  amitriptyline (ELAVIL) 25 MG tablet, TAKE 1 TABLET BY MOUTH EVERY NIGHT AT BEDTIME (Patient taking differently: Take 25 mg by mouth at bedtime as needed. ), Disp: 30 tablet, Rfl: 0 .  atorvastatin (LIPITOR) 20 MG tablet, Take 1 tablet (20 mg total) by mouth at bedtime. (Patient taking  differently: Take 20 mg by mouth every morning. ), Disp: 90 tablet, Rfl: 3 .  buPROPion (WELLBUTRIN SR) 150 MG 12 hr tablet, Take 150 mg by mouth daily. , Disp: , Rfl:  .  FLUoxetine (PROZAC) 20 MG capsule, TAKE 1 CAPSULE BY MOUTH 2 TIMES A DAY (Patient taking differently: Take 60 mg by mouth every morning. ), Disp: 60 capsule, Rfl: 0 .  gabapentin (NEURONTIN) 300 MG capsule, Take 1-3 capsules (300-900 mg total) by mouth See admin instructions. 300mg  in the morning and 900mg  at bedtime, Disp: 120 capsule, Rfl: 1 .  loperamide (IMODIUM) 2 MG capsule, TAKE 1 TO 2 CAPSULES BY MOUTH EVERY 6 HOURS AS NEEDED FOR DIARRHEA OR LOOSE STOOLS (Patient taking differently: Take 2-4 mg by mouth as needed. ), Disp: 60 capsule, Rfl: 0 .  omeprazole (PRILOSEC) 20 MG capsule, Take 20 mg by mouth daily., Disp: , Rfl:  .  PROAIR HFA 108 (90 Base) MCG/ACT inhaler, INHALE 2 PUFFS BY MOUTH EVERY 6 HOURS AS NEEDED FOR SHORTNESS OF BREATH/WHEEZING. (Patient taking differently: Inhale 2 puffs into the lungs every 6 (six) hours as needed for wheezing or shortness of breath. ), Disp: 18 each, Rfl: 2  Allergies Patient has no known allergies.  Review of Systems Review of Systems - Oncology ROS negative   Physical Exam  Vitals Wt Readings from Last 3 Encounters:  12/05/17 238 lb (108 kg)  11/30/17 240 lb (108.9 kg)  11/20/17 232 lb 1.6 oz (105.3 kg)   Temp Readings from Last 3 Encounters:  12/05/17 98 F (36.7 C) (Oral)  11/30/17 99 F (37.2 C)  11/20/17 98 F (36.7 C) (Oral)   BP Readings from Last 3 Encounters:  12/05/17 133/78  11/30/17 121/85  11/20/17 140/77   Pulse Readings from Last 3 Encounters:  12/05/17 (!) 103  11/30/17 (!) 113  11/20/17 (!) 110    Constitutional: Well-developed, well-nourished, and in no distress.   HENT: Head: Normocephalic and atraumatic.  Mouth/Throat: No oropharyngeal exudate. Mucosa moist. Eyes: Pupils are equal, round, and reactive to light. Conjunctivae are normal.  No scleral icterus.  Neck: Normal range of motion. Neck supple. No JVD present.  Cardiovascular: Normal rate, regular rhythm and  normal heart sounds.  Exam reveals no gallop and no friction rub.   No murmur heard. Pulmonary/Chest: Effort normal and breath sounds normal. No respiratory distress. No wheezes.No rales.  Abdominal: Soft. Bowel sounds are normal. No distension. There is no tenderness. There is no guarding. Minor bruise noted on abdomen from recent biopsy attempt.   Musculoskeletal: No edema or tenderness.  Lymphadenopathy: No cervical, axillary or supraclavicular adenopathy.  Neurological: Alert and oriented to person, place, and time. No cranial nerve deficit.  Skin: Skin is warm and dry. No rash noted. No erythema. No pallor.  Psychiatric: Affect and judgment normal.   Labs No visits with results within 3 Day(s) from this visit.  Latest known visit with results is:  Hospital Outpatient Visit on 11/30/2017  Component Date Value Ref Range Status  . WBC 11/30/2017 12.0* 4.0 - 10.5 K/uL Final  . RBC 11/30/2017 4.64  4.22 - 5.81 MIL/uL Final  . Hemoglobin 11/30/2017 14.1  13.0 - 17.0 g/dL Final  . HCT 11/30/2017 44.9  39.0 - 52.0 % Final  . MCV 11/30/2017 96.8  80.0 - 100.0 fL Final  . MCH 11/30/2017 30.4  26.0 - 34.0 pg Final  . MCHC 11/30/2017 31.4  30.0 - 36.0 g/dL Final  . RDW 11/30/2017 13.3  11.5 - 15.5 % Final  . Platelets 11/30/2017 314  150 - 400 K/uL Final  . nRBC 11/30/2017 0.0  0.0 - 0.2 % Final   Performed at [facility], [place] [address], [place], [place] [address]  . Prothrombin Time 11/30/2017 14.0  11.4 - 15.2 seconds Final  . INR 11/30/2017 1.09   Final   Performed at [facility] [address], [place], [place] [address]     Pathology No orders of the defined types were placed in this encounter.      Zoila Shutter MD

## 2017-12-05 NOTE — Patient Instructions (Signed)
[facility] at [facility] Discharge Instructions  Today you saw Dr. Higgs   Thank you for choosing [facility] at [facility] to provide your oncology and hematology care.  To afford each patient quality time with our provider, please arrive at least 15 minutes before your scheduled appointment time.   If you have a lab appointment with the Cancer Center please come in thru the  Main Entrance and check in at the main information desk  You need to re-schedule your appointment should you arrive 10 or more minutes late.  We strive to give you quality time with our providers, and arriving late affects you and other patients whose appointments are after yours.  Also, if you no show three or more times for appointments you may be dismissed from the clinic at the providers discretion.     Again, thank you for choosing [facility].  Our hope is that these requests will decrease the amount of time that you wait before being seen by our physicians.       _____________________________________________________________  Should you have questions after your visit to [facility], please contact our office at [phone] between the hours of 8:00 a.m. and 4:30 p.m.  Voicemails left after 4:00 p.m. will not be returned until the following business day.  For prescription refill requests, have your pharmacy contact our office and allow 72 hours.    Cancer Center Support Programs:   > Cancer Support Group  2nd Tuesday of the month 1pm-2pm, Journey Room   

## 2017-12-13 ENCOUNTER — Other Ambulatory Visit: Payer: Self-pay | Admitting: Internal Medicine

## 2017-12-13 ENCOUNTER — Encounter: Payer: Self-pay | Admitting: General Surgery

## 2017-12-13 ENCOUNTER — Ambulatory Visit (INDEPENDENT_AMBULATORY_CARE_PROVIDER_SITE_OTHER): Payer: Medicaid Other | Admitting: General Surgery

## 2017-12-13 VITALS — BP 186/114 | HR 116 | Temp 96.4°F | Resp 18 | Wt 238.0 lb

## 2017-12-13 DIAGNOSIS — C184 Malignant neoplasm of transverse colon: Secondary | ICD-10-CM

## 2017-12-13 NOTE — H&P (Signed)
Erik [name]; [id number]; 1955/05/12   HPI Patient is a 62 year old white male who was referred back to my care by Dr. Delton Coombes for exploratory laparotomy.  Recent work-up for his known colon cancer shows possible carcinomatosis.  A previous CT-guided biopsy was not productive.  I have been asked to do an exploratory laparotomy with omentectomy.  Patient currently is asymptomatic.  He denies abdominal pain.  He does note that after his original colectomy, he had an immediate wound dehiscence requiring return to the operating room for closure of his abdominal wound. Past Medical History:  Diagnosis Date  . Abnormal stress echocardiogram   . Adenocarcinoma of transverse colon ([place]) 02/10/2015  . Alcohol abuse    Heavy Use up until 2010  . Anxiety   . Blood transfusion without reported diagnosis   . COPD (chronic obstructive pulmonary disease) ([place])   . Depression   . Emphysema of lung ([place])   . GERD (gastroesophageal reflux disease)   . Head trauma 2001   closed head injury; coma for 4 weeks  . Hypercholesterolemia   . Hypertension   . ING HERN W/GANGREN RECUR UNILAT/UNSPEC ING HERN 04/21/2009   Qualifier: Diagnosis of  By: Verl Blalock, MD, Delanna Ahmadi PTSD (post-traumatic stress disorder)   . Pulmonary nodule 06/14/2016  . SAH (subarachnoid hemorrhage) ([place]) 08/31/2012  . SDH (subdural hematoma) ([place]) 08/31/2012    Past Surgical History:  Procedure Laterality Date  . BIOPSY  01/25/2016   Procedure: BIOPSY;  Surgeon: Danie Binder, MD;  [facility];  Service: Endoscopy;;  ileum;   . cardiac cath    . COLONOSCOPY  2011   Dr. Oneida Alar: multiple adenomas and hyperplastic polyps  . COLONOSCOPY WITH PROPOFOL N/A 01/12/2015   Procedure: COLONOSCOPY WITH PROPOFOL;  Surgeon: Danie Binder, MD;  [facility];  Service: Endoscopy;  Laterality: N/A;  1030  . COLONOSCOPY WITH PROPOFOL N/A 01/25/2016   Procedure: COLONOSCOPY WITH PROPOFOL;  Surgeon: Danie Binder, MD;   [facility];  Service: Endoscopy;  Laterality: N/A;  1230  . CRANIOTOMY  2001  . ESOPHAGOGASTRODUODENOSCOPY (EGD) WITH PROPOFOL N/A 01/12/2015   Procedure: ESOPHAGOGASTRODUODENOSCOPY (EGD) WITH PROPOFOL;  Surgeon: Danie Binder, MD;  [facility];  Service: Endoscopy;  Laterality: N/A;  . head injury surgery    . HERNIA REPAIR Right 2012   Inguinal- Forestine Na  . KIDNEY SURGERY     >30 years ago  . LAPAROSCOPIC RIGHT HEMI COLECTOMY Left 02/10/2015   Procedure: LAPAROSCOPIC THEN OPEN RIGHT HEMI COLECTOMY;  Surgeon: [name], MD;  Location: [facility];  Service: General;  Laterality: Left;  . POLYPECTOMY  01/25/2016   Procedure: POLYPECTOMY;  Surgeon: Danie Binder, MD;  [facility];  Service: Endoscopy;;  colon  . PORT-A-CATH REMOVAL N/A 02/26/2017   Procedure: MINOR REMOVAL PORT-A-CATH;  Surgeon: Aviva Signs, MD;  Location: [facility];  Service: General;  Laterality: N/A;  Pt to arrive at 8:00am  . PORTACATH PLACEMENT N/A 03/24/2015   Procedure: INSERTION PORT-A-CATH;  Surgeon: Jules Husbands, MD;  Location: [facility];  Service: General;  Laterality: N/A;    Family History  Problem Relation Age of Onset  . Pulmonary embolism Mother   . Breast cancer Mother   . Cancer Mother        breast cancer  . Arthritis Mother   . Heart disease Father   . Cancer Father 93       Leukemia  .  Cancer Paternal Grandfather        Lung  . Ataxia Neg Hx   . Chorea Neg Hx   . Dementia Neg Hx   . Mental retardation Neg Hx   . Migraines Neg Hx   . Multiple sclerosis Neg Hx   . Neurofibromatosis Neg Hx   . Neuropathy Neg Hx   . Parkinsonism Neg Hx   . Seizures Neg Hx   . Stroke Neg Hx   . Colon cancer Neg Hx     Current Outpatient Medications on File Prior to Visit  Medication Sig Dispense Refill  . amitriptyline (ELAVIL) 25 MG tablet TAKE 1 TABLET BY MOUTH EVERY NIGHT AT BEDTIME (Patient taking differently: Take 25 mg by mouth at bedtime as needed. ) 30 tablet  0  . atorvastatin (LIPITOR) 20 MG tablet Take 1 tablet (20 mg total) by mouth at bedtime. (Patient taking differently: Take 20 mg by mouth every morning. ) 90 tablet 3  . buPROPion (WELLBUTRIN SR) 150 MG 12 hr tablet Take 150 mg by mouth daily.     Marland Kitchen FLUoxetine (PROZAC) 20 MG capsule TAKE 1 CAPSULE BY MOUTH 2 TIMES A DAY (Patient taking differently: Take 60 mg by mouth every morning. ) 60 capsule 0  . gabapentin (NEURONTIN) 300 MG capsule Take 1-3 capsules (300-900 mg total) by mouth See admin instructions. 300mg  in the morning and 900mg  at bedtime 120 capsule 1  . loperamide (IMODIUM) 2 MG capsule TAKE 1 TO 2 CAPSULES BY MOUTH EVERY 6 HOURS AS NEEDED FOR DIARRHEA OR LOOSE STOOLS (Patient taking differently: Take 2-4 mg by mouth as needed. ) 60 capsule 0  . omeprazole (PRILOSEC) 20 MG capsule Take 20 mg by mouth daily.    Marland Kitchen PROAIR HFA 108 (90 Base) MCG/ACT inhaler INHALE 2 PUFFS BY MOUTH EVERY 6 HOURS AS NEEDED FOR SHORTNESS OF BREATH/WHEEZING. (Patient taking differently: Inhale 2 puffs into the lungs every 6 (six) hours as needed for wheezing or shortness of breath. ) 18 each 2   No current facility-administered medications on file prior to visit.     No Known Allergies  Social History   Substance and Sexual Activity  Alcohol Use Yes  . Alcohol/week: 0.0 standard drinks   Comment: beer occ, history of ETOH abuse in remote past.     Social History   Tobacco Use  Smoking Status Current Some Day Smoker  . Packs/day: 0.10  . Years: 30.00  . Pack years: 3.00  . Types: Cigarettes  . Start date: 02/06/1973  Smokeless Tobacco Never Used  Tobacco Comment   smokes every 2-3 days, 2-3 a day when smokes    Review of Systems  Constitutional: Positive for chills and fever.  Eyes: Positive for blurred vision.  Respiratory: Positive for shortness of breath.   Cardiovascular: Negative.   Gastrointestinal: Negative.   Genitourinary: Negative.   Musculoskeletal: Positive for joint pain.   Skin: Positive for rash.  Neurological: Positive for tremors and sensory change.  Endo/Heme/Allergies: Negative.   Psychiatric/Behavioral: Negative.     Objective   Vitals:   12/13/17 0956  BP: (!) 186/114  Pulse: (!) 116  Resp: 18  Temp: (!) 96.4 F (35.8 C)    Physical Exam  Constitutional: He is oriented to person, place, and time. He appears well-developed and well-nourished. No distress.  HENT:  Head: Normocephalic and atraumatic.  Cardiovascular: Normal rate, regular rhythm and normal heart sounds. Exam reveals no gallop and no friction rub.  No murmur heard. Pulmonary/Chest:  Effort normal and breath sounds normal. No stridor. No respiratory distress. He has no wheezes. He has no rales.  Abdominal: Soft. Bowel sounds are normal. He exhibits no distension and no mass. There is no tenderness. There is no rebound and no guarding. No hernia.  Neurological: He is alert and oriented to person, place, and time.  Skin: Skin is warm and dry.  Vitals reviewed. Oncology notes reviewed.  CT scan report reviewed.  Assessment  Colon carcinoma, possible recurrence in the abdomen. Plan   Patient is scheduled for exploratory laparotomy, omentectomy, and lymph node biopsy on 12/19/2017.  The risks and benefits of the procedure including bleeding, infection, and wound dehiscence were fully explained to the patient, who gave informed consent.  He may require mesh closure depending on how the fascia appears.

## 2017-12-13 NOTE — Progress Notes (Signed)
Erik [name]; [id number]; 06/23/1955   HPI Patient is a 62 year old white male who was referred back to my care by Dr. Delton Coombes for exploratory laparotomy.  Recent work-up for his known colon cancer shows possible carcinomatosis.  A previous CT-guided biopsy was not productive.  I have been asked to do an exploratory laparotomy with omentectomy.  Patient currently is asymptomatic.  He denies abdominal pain.  He does note that after his original colectomy, he had an immediate wound dehiscence requiring return to the operating room for closure of his abdominal wound. Past Medical History:  Diagnosis Date  . Abnormal stress echocardiogram   . Adenocarcinoma of transverse colon ([place]) 02/10/2015  . Alcohol abuse    Heavy Use up until 2010  . Anxiety   . Blood transfusion without reported diagnosis   . COPD (chronic obstructive pulmonary disease) ([place])   . Depression   . Emphysema of lung ([place])   . GERD (gastroesophageal reflux disease)   . Head trauma 2001   closed head injury; coma for 4 weeks  . Hypercholesterolemia   . Hypertension   . ING HERN W/GANGREN RECUR UNILAT/UNSPEC ING HERN 04/21/2009   Qualifier: Diagnosis of  By: Verl Blalock, MD, Delanna Ahmadi PTSD (post-traumatic stress disorder)   . Pulmonary nodule 06/14/2016  . SAH (subarachnoid hemorrhage) ([place]) 08/31/2012  . SDH (subdural hematoma) ([place]) 08/31/2012    Past Surgical History:  Procedure Laterality Date  . BIOPSY  01/25/2016   Procedure: BIOPSY;  Surgeon: Danie Binder, MD;  [facility];  Service: Endoscopy;;  ileum;   . cardiac cath    . COLONOSCOPY  2011   Dr. Oneida Alar: multiple adenomas and hyperplastic polyps  . COLONOSCOPY WITH PROPOFOL N/A 01/12/2015   Procedure: COLONOSCOPY WITH PROPOFOL;  Surgeon: Danie Binder, MD;  [facility];  Service: Endoscopy;  Laterality: N/A;  1030  . COLONOSCOPY WITH PROPOFOL N/A 01/25/2016   Procedure: COLONOSCOPY WITH PROPOFOL;  Surgeon: Danie Binder, MD;   [facility];  Service: Endoscopy;  Laterality: N/A;  1230  . CRANIOTOMY  2001  . ESOPHAGOGASTRODUODENOSCOPY (EGD) WITH PROPOFOL N/A 01/12/2015   Procedure: ESOPHAGOGASTRODUODENOSCOPY (EGD) WITH PROPOFOL;  Surgeon: Danie Binder, MD;  [facility];  Service: Endoscopy;  Laterality: N/A;  . head injury surgery    . HERNIA REPAIR Right 2012   Inguinal- Forestine Na  . KIDNEY SURGERY     >30 years ago  . LAPAROSCOPIC RIGHT HEMI COLECTOMY Left 02/10/2015   Procedure: LAPAROSCOPIC THEN OPEN RIGHT HEMI COLECTOMY;  Surgeon: [name], MD;  Location: [facility];  Service: General;  Laterality: Left;  . POLYPECTOMY  01/25/2016   Procedure: POLYPECTOMY;  Surgeon: Danie Binder, MD;  [facility];  Service: Endoscopy;;  colon  . PORT-A-CATH REMOVAL N/A 02/26/2017   Procedure: MINOR REMOVAL PORT-A-CATH;  Surgeon: Aviva Signs, MD;  [facility];  Service: General;  Laterality: N/A;  Pt to arrive at 8:00am  . PORTACATH PLACEMENT N/A 03/24/2015   Procedure: INSERTION PORT-A-CATH;  Surgeon: Jules Husbands, MD;  Location: [facility];  Service: General;  Laterality: N/A;    Family History  Problem Relation Age of Onset  . Pulmonary embolism Mother   . Breast cancer Mother   . Cancer Mother        breast cancer  . Arthritis Mother   . Heart disease Father   . Cancer Father 95       Leukemia  .  Cancer Paternal Grandfather        Lung  . Ataxia Neg Hx   . Chorea Neg Hx   . Dementia Neg Hx   . Mental retardation Neg Hx   . Migraines Neg Hx   . Multiple sclerosis Neg Hx   . Neurofibromatosis Neg Hx   . Neuropathy Neg Hx   . Parkinsonism Neg Hx   . Seizures Neg Hx   . Stroke Neg Hx   . Colon cancer Neg Hx     Current Outpatient Medications on File Prior to Visit  Medication Sig Dispense Refill  . amitriptyline (ELAVIL) 25 MG tablet TAKE 1 TABLET BY MOUTH EVERY NIGHT AT BEDTIME (Patient taking differently: Take 25 mg by mouth at bedtime as needed. ) 30 tablet  0  . atorvastatin (LIPITOR) 20 MG tablet Take 1 tablet (20 mg total) by mouth at bedtime. (Patient taking differently: Take 20 mg by mouth every morning. ) 90 tablet 3  . buPROPion (WELLBUTRIN SR) 150 MG 12 hr tablet Take 150 mg by mouth daily.     Marland Kitchen FLUoxetine (PROZAC) 20 MG capsule TAKE 1 CAPSULE BY MOUTH 2 TIMES A DAY (Patient taking differently: Take 60 mg by mouth every morning. ) 60 capsule 0  . gabapentin (NEURONTIN) 300 MG capsule Take 1-3 capsules (300-900 mg total) by mouth See admin instructions. 300mg  in the morning and 900mg  at bedtime 120 capsule 1  . loperamide (IMODIUM) 2 MG capsule TAKE 1 TO 2 CAPSULES BY MOUTH EVERY 6 HOURS AS NEEDED FOR DIARRHEA OR LOOSE STOOLS (Patient taking differently: Take 2-4 mg by mouth as needed. ) 60 capsule 0  . omeprazole (PRILOSEC) 20 MG capsule Take 20 mg by mouth daily.    Marland Kitchen PROAIR HFA 108 (90 Base) MCG/ACT inhaler INHALE 2 PUFFS BY MOUTH EVERY 6 HOURS AS NEEDED FOR SHORTNESS OF BREATH/WHEEZING. (Patient taking differently: Inhale 2 puffs into the lungs every 6 (six) hours as needed for wheezing or shortness of breath. ) 18 each 2   No current facility-administered medications on file prior to visit.     No Known Allergies  Social History   Substance and Sexual Activity  Alcohol Use Yes  . Alcohol/week: 0.0 standard drinks   Comment: beer occ, history of ETOH abuse in remote past.     Social History   Tobacco Use  Smoking Status Current Some Day Smoker  . Packs/day: 0.10  . Years: 30.00  . Pack years: 3.00  . Types: Cigarettes  . Start date: 02/06/1973  Smokeless Tobacco Never Used  Tobacco Comment   smokes every 2-3 days, 2-3 a day when smokes    Review of Systems  Constitutional: Positive for chills and fever.  Eyes: Positive for blurred vision.  Respiratory: Positive for shortness of breath.   Cardiovascular: Negative.   Gastrointestinal: Negative.   Genitourinary: Negative.   Musculoskeletal: Positive for joint pain.   Skin: Positive for rash.  Neurological: Positive for tremors and sensory change.  Endo/Heme/Allergies: Negative.   Psychiatric/Behavioral: Negative.     Objective   Vitals:   12/13/17 0956  BP: (!) 186/114  Pulse: (!) 116  Resp: 18  Temp: (!) 96.4 F (35.8 C)    Physical Exam  Constitutional: He is oriented to person, place, and time. He appears well-developed and well-nourished. No distress.  HENT:  Head: Normocephalic and atraumatic.  Cardiovascular: Normal rate, regular rhythm and normal heart sounds. Exam reveals no gallop and no friction rub.  No murmur heard. Pulmonary/Chest:  Effort normal and breath sounds normal. No stridor. No respiratory distress. He has no wheezes. He has no rales.  Abdominal: Soft. Bowel sounds are normal. He exhibits no distension and no mass. There is no tenderness. There is no rebound and no guarding. No hernia.  Neurological: He is alert and oriented to person, place, and time.  Skin: Skin is warm and dry.  Vitals reviewed. Oncology notes reviewed.  CT scan report reviewed.  Assessment  Colon carcinoma, possible recurrence in the abdomen. Plan   Patient is scheduled for exploratory laparotomy, omentectomy, and lymph node biopsy on 12/19/2017.  The risks and benefits of the procedure including bleeding, infection, and wound dehiscence were fully explained to the patient, who gave informed consent.  He may require mesh closure depending on how the fascia appears.

## 2017-12-13 NOTE — Patient Instructions (Signed)
Erik [name]  12/13/2017     @PREFPERIOPPHARMACY @   Your procedure is scheduled on  12/19/2017 .  Report to [facility] at  900   A.M.  Call this number if you have problems the morning of surgery:  [phone]   Remember:  Do not eat or drink after midnight.                         Take these medicines the morning of surgery with A SIP OF WATER  Wellbutrin, prozac, gabapentin, prilosec. Use your inhaler before you come.    Do not wear jewelry, make-up or nail polish.  Do not wear lotions, powders, or perfumes, or deodorant.  Do not shave 48 hours prior to surgery.  Men may shave face and neck.  Do not bring valuables to the hospital.  [facility] is not responsible for any belongings or valuables.  Contacts, dentures or bridgework may not be worn into surgery.  Leave your suitcase in the car.  After surgery it may be brought to your room.  For patients admitted to the hospital, discharge time will be determined by your treatment team.  Patients discharged the day of surgery will not be allowed to drive home.   Name and phone number of your driver:   family Special instructions:  Follow any diet and prep instructions given to you by Dr Arnoldo Morale.  Please read over the following fact sheets that you were given. Anesthesia Post-op Instructions and Care and Recovery After Surgery      Exploratory Laparotomy, Adult Exploratory laparotomy is a surgical procedure to examine the organs inside your belly (abdomen). Another name for this is abdominal exploration. You may have this procedure if you have abdominal pain, trauma, bleeding, infection, or obstruction. The procedure may be done if your health care provider cannot make a diagnosis from only an exam and testing. Exploratory laparotomy may be a planned procedure or an emergency procedure. You may have surgical treatment as part of the laparotomy, or you may have additional treatment after your laparotomy. This  will depend on what your surgeon finds during the procedure. Tell a health care provider about:  Any allergies you have.  All medicines you are taking, including vitamins, herbs, eye drops, creams, and over-the-counter medicines.  Any problems you or family members have had with anesthetic medicines.  Any blood disorders you have.  Any surgeries you have had.  Any medical conditions you have. What are the risks? Generally, this is a safe procedure. However, problems can occur and include:  Bleeding.  Infection.  A blood clot that forms in your leg and travels to your lungs.  Damage to organs inside your abdomen.  Scar tissue that blocks your digestive tract.  What happens before the procedure?  Ask your health care provider about: ? Changing or stopping your regular medicines. This is especially important if you are taking diabetes medicines or blood thinners. ? Taking medicines such as aspirin and ibuprofen. These medicines can thin your blood. Do not take these medicines before your procedure if your health care provider instructs you not to.  Do not eat or drink anything after midnight on the night before the procedure or as directed by your health care provider.  You may be given instructions for clearing out your bowel before surgery (bowel prep). If you are already in the hospital, the bowel prep may be done there.  What happens during the procedure?  An IV tube may be inserted into a vein. You may receive fluids and medicine through the IV tube. This may include antibiotic medicine to treat or prevent infection.  You will be given a medicine that makes you go to sleep (general anesthetic).  You may have a tube placed through your nose and into your stomach (nasogastric tube) to drain your stomach fluids.  You may have a tube placed into your bladder (urinary catheter) to drain urine.  Your abdomen will be cleaned with a germ-killing solution (antiseptic).  The  surgeon will make a surgical cut (incision) in your abdomen. This is usually an up-and-down incision in the midsection of your abdomen. The incision will go through the inside lining of your abdomen (peritoneum).  Your surgeon will spread the incision wide enough to examine the inside of your abdomen.  The rest of the procedure will depend on what the surgeon finds: ? The surgeon will check all organs in your abdomen for damage or obstruction. Repairs will be made when possible. ? If there is blood in the abdomen, the surgeon will look for the source of the bleeding in order to stop it. ? If there is yellowish-white fluid (pus) or gastric fluids in your abdomen, the surgeon will check for an infection or a hole (perforation) in your digestive tract. ? If the surgeon finds infection, a drain may be placed to empty fluid that can build up in your abdomen after surgery. ? If there is a growth (tumor) inside your abdomen, the surgeon may remove a piece of the growth (biopsy) to examine it under a microscope.  When all procedures are complete, the surgeon will close your abdomen with layers of stitches (sutures).  The incision through the skin of your abdomen will be closed with sutures or staples. What happens after the procedure?  Your blood pressure, heart rate, breathing rate, and blood oxygen level will be monitored often until the medicines you were given have worn off.  You will continue to receive fluids and nutrition through your IV tube. This will stop when you can eat and drink on your own.  You may also get antibiotic medicine and pain medicine through your IV tube.  Your nasogastric tube may be removed when you start to pass gas.  Your urinary catheter may be removed when the anesthetic wears off. This information is not intended to replace advice given to you by your health care provider. Make sure you discuss any questions you have with your health care provider. Document  Released: 10/18/2000 Document Revised: 07/01/2015 Document Reviewed: 09/10/2013 Elsevier Interactive Patient Education  2018 [employer]. Exploratory Laparotomy, Adult, Care After Refer to this sheet in the next few weeks. These instructions provide you with information about caring for yourself after your procedure. Your health care provider may also give you more specific instructions. Your treatment has been planned according to current medical practices, but problems sometimes occur. Call your health care provider if you have any problems or questions after your procedure. What can I expect after the procedure? After your procedure, it is typical to have:  Abdominal soreness.  Fatigue.  A sore throat from tubes in your throat.  A lack of appetite.  Follow these instructions at home: Medicines  Take medicines only as directed by your health care provider.  Do not drive or operate heavy machinery while taking pain medicine. Incision care  There are many different ways to close and cover  an incision, including stitches (sutures), skin glue, and adhesive strips. Follow your health care provider's instructions about: ? Incision care. ? Bandage (dressing) changes and removal. ? Incision closure removal.  Do not take showers or baths until your health care provider says that you can.  Check your incision area daily for signs of infection. Watch for: ? Redness. ? Tenderness. ? Swelling. ? Drainage. Activity  Do not lift anything that is heavier than 10 pounds (4.5 kg) until your health care provider says that it is safe.  Try to walk a little bit each day if your health care provider says that it is okay.  Ask your health care provider when you can start to do your usual activities again, such as driving, going back to work, and having sex. Eating and drinking  You may eat what you usually eat. Include lots of whole grains, fruits, and vegetables in your diet. This will help  to prevent constipation.  Drink enough fluid to keep your urine clear or pale yellow. General instructions  Keep all follow-up visits as directed by your health care provider. This is important. Contact a health care provider if:  You have a fever.  You have chills.  Your pain medicine is not helping.  You have constipation or diarrhea.  You have nausea or vomiting.  You have drainage, redness, swelling, or pain at your incision site. Get help right away if:  Your pain is getting worse.  It has been more than 3 days since you been able to have a bowel movement.  You have ongoing (persistent) vomiting.  The edges of your incision open up.  You have warmth, tenderness, and swelling in your calf.  You have trouble breathing.  You have chest pain. This information is not intended to replace advice given to you by your health care provider. Make sure you discuss any questions you have with your health care provider. Document Released: 09/07/2003 Document Revised: 07/01/2015 Document Reviewed: 09/10/2013 Elsevier Interactive Patient Education  2018 [employer] Anesthesia, Adult General anesthesia is the use of medicines to make a person "go to sleep" (be unconscious) for a medical procedure. General anesthesia is often recommended when a procedure:  Is long.  Requires you to be still or in an unusual position.  Is major and can cause you to lose blood.  Is impossible to do without general anesthesia.  The medicines used for general anesthesia are called general anesthetics. In addition to making you sleep, the medicines:  Prevent pain.  Control your blood pressure.  Relax your muscles.  Tell a health care provider about:  Any allergies you have.  All medicines you are taking, including vitamins, herbs, eye drops, creams, and over-the-counter medicines.  Any problems you or family members have had with anesthetic medicines.  Types of anesthetics  you have had in the past.  Any bleeding disorders you have.  Any surgeries you have had.  Any medical conditions you have.  Any history of heart or lung conditions, such as heart failure, sleep apnea, or chronic obstructive pulmonary disease (COPD).  Whether you are pregnant or may be pregnant.  Whether you use tobacco, alcohol, marijuana, or street drugs.  Any history of Armed forces logistics/support/administrative officer.  Any history of depression or anxiety. What are the risks? Generally, this is a safe procedure. However, problems may occur, including:  Allergic reaction to anesthetics.  Lung and heart problems.  Inhaling food or liquids from your stomach into your lungs (aspiration).  Injury to nerves.  Waking up during your procedure and being unable to move (rare).  Extreme agitation or a state of mental confusion (delirium) when you wake up from the anesthetic.  Air in the bloodstream, which can lead to stroke.  These problems are more likely to develop if you are having a major surgery or if you have an advanced medical condition. You can prevent some of these complications by answering all of your health care provider's questions thoroughly and by following all pre-procedure instructions. General anesthesia can cause side effects, including:  Nausea or vomiting  A sore throat from the breathing tube.  Feeling cold or shivery.  Feeling tired, washed out, or achy.  Sleepiness or drowsiness.  Confusion or agitation.  What happens before the procedure? Staying hydrated Follow instructions from your health care provider about hydration, which may include:  Up to 2 hours before the procedure - you may continue to drink clear liquids, such as water, clear fruit juice, black coffee, and plain tea.  Eating and drinking restrictions Follow instructions from your health care provider about eating and drinking, which may include:  8 hours before the procedure - stop eating heavy meals or foods  such as meat, fried foods, or fatty foods.  6 hours before the procedure - stop eating light meals or foods, such as toast or cereal.  6 hours before the procedure - stop drinking milk or drinks that contain milk.  2 hours before the procedure - stop drinking clear liquids.  Medicines  Ask your health care provider about: ? Changing or stopping your regular medicines. This is especially important if you are taking diabetes medicines or blood thinners. ? Taking medicines such as aspirin and ibuprofen. These medicines can thin your blood. Do not take these medicines before your procedure if your health care provider instructs you not to. ? Taking new dietary supplements or medicines. Do not take these during the week before your procedure unless your health care provider approves them.  If you are told to take a medicine or to continue taking a medicine on the day of the procedure, take the medicine with sips of water. General instructions   Ask if you will be going home the same day, the following day, or after a longer hospital stay. ? Plan to have someone take you home. ? Plan to have someone stay with you for the first 24 hours after you leave the hospital or clinic.  For 3-6 weeks before the procedure, try not to use any tobacco products, such as cigarettes, chewing tobacco, and e-cigarettes.  You may brush your teeth on the morning of the procedure, but make sure to spit out the toothpaste. What happens during the procedure?  You will be given anesthetics through a mask and through an IV tube in one of your veins.  You may receive medicine to help you relax (sedative).  As soon as you are asleep, a breathing tube may be used to help you breathe.  An anesthesia specialist will stay with you throughout the procedure. He or she will help keep you comfortable and safe by continuing to give you medicines and adjusting the amount of medicine that you get. He or she will also watch  your blood pressure, pulse, and oxygen levels to make sure that the anesthetics do not cause any problems.  If a breathing tube was used to help you breathe, it will be removed before you wake up. The procedure may vary among health  care providers and hospitals. What happens after the procedure?  You will wake up, often slowly, after the procedure is complete, usually in a recovery area.  Your blood pressure, heart rate, breathing rate, and blood oxygen level will be monitored until the medicines you were given have worn off.  You may be given medicine to help you calm down if you feel anxious or agitated.  If you will be going home the same day, your health care provider may check to make sure you can stand, drink, and urinate.  Your health care providers will treat your pain and side effects before you go home.  Do not drive for 24 hours if you received a sedative.  You may: ? Feel nauseous and vomit. ? Have a sore throat. ? Have mental slowness. ? Feel cold or shivery. ? Feel sleepy. ? Feel tired. ? Feel sore or achy, even in parts of your body where you did not have surgery. This information is not intended to replace advice given to you by your health care provider. Make sure you discuss any questions you have with your health care provider. Document Released: 05/02/2007 Document Revised: 07/06/2015 Document Reviewed: 01/07/2015 Elsevier Interactive Patient Education  2018 [place] Anesthesia, Adult, Care After These instructions provide you with information about caring for yourself after your procedure. Your health care provider may also give you more specific instructions. Your treatment has been planned according to current medical practices, but problems sometimes occur. Call your health care provider if you have any problems or questions after your procedure. What can I expect after the procedure? After the procedure, it is common to have:  Vomiting.  A  sore throat.  Mental slowness.  It is common to feel:  Nauseous.  Cold or shivery.  Sleepy.  Tired.  Sore or achy, even in parts of your body where you did not have surgery.  Follow these instructions at home: For at least 24 hours after the procedure:  Do not: ? Participate in activities where you could fall or become injured. ? Drive. ? Use heavy machinery. ? Drink alcohol. ? Take sleeping pills or medicines that cause drowsiness. ? Make important decisions or sign legal documents. ? Take care of children on your own.  Rest. Eating and drinking  If you vomit, drink water, juice, or soup when you can drink without vomiting.  Drink enough fluid to keep your urine clear or pale yellow.  Make sure you have little or no nausea before eating solid foods.  Follow the diet recommended by your health care provider. General instructions  Have a responsible adult stay with you until you are awake and alert.  Return to your normal activities as told by your health care provider. Ask your health care provider what activities are safe for you.  Take over-the-counter and prescription medicines only as told by your health care provider.  If you smoke, do not smoke without supervision.  Keep all follow-up visits as told by your health care provider. This is important. Contact a health care provider if:  You continue to have nausea or vomiting at home, and medicines are not helpful.  You cannot drink fluids or start eating again.  You cannot urinate after 8-12 hours.  You develop a skin rash.  You have fever.  You have increasing redness at the site of your procedure. Get help right away if:  You have difficulty breathing.  You have chest pain.  You have unexpected bleeding.  You feel that you are having a life-threatening or urgent problem. This information is not intended to replace advice given to you by your health care provider. Make sure you discuss any  questions you have with your health care provider. Document Released: 05/01/2000 Document Revised: 06/28/2015 Document Reviewed: 01/07/2015 Elsevier Interactive Patient Education  [employer].

## 2017-12-13 NOTE — Patient Instructions (Signed)
Exploratory Laparotomy, Adult Exploratory laparotomy is a surgical procedure to examine the organs inside your belly (abdomen). Another name for this is abdominal exploration. You may have this procedure if you have abdominal pain, trauma, bleeding, infection, or obstruction. The procedure may be done if your health care provider cannot make a diagnosis from only an exam and testing. Exploratory laparotomy may be a planned procedure or an emergency procedure. You may have surgical treatment as part of the laparotomy, or you may have additional treatment after your laparotomy. This will depend on what your surgeon finds during the procedure. Tell a health care provider about:  Any allergies you have.  All medicines you are taking, including vitamins, herbs, eye drops, creams, and over-the-counter medicines.  Any problems you or family members have had with anesthetic medicines.  Any blood disorders you have.  Any surgeries you have had.  Any medical conditions you have. What are the risks? Generally, this is a safe procedure. However, problems can occur and include:  Bleeding.  Infection.  A blood clot that forms in your leg and travels to your lungs.  Damage to organs inside your abdomen.  Scar tissue that blocks your digestive tract.  What happens before the procedure?  Ask your health care provider about: ? Changing or stopping your regular medicines. This is especially important if you are taking diabetes medicines or blood thinners. ? Taking medicines such as aspirin and ibuprofen. These medicines can thin your blood. Do not take these medicines before your procedure if your health care provider instructs you not to.  Do not eat or drink anything after midnight on the night before the procedure or as directed by your health care provider.  You may be given instructions for clearing out your bowel before surgery (bowel prep). If you are already in the hospital, the bowel prep  may be done there. What happens during the procedure?  An IV tube may be inserted into a vein. You may receive fluids and medicine through the IV tube. This may include antibiotic medicine to treat or prevent infection.  You will be given a medicine that makes you go to sleep (general anesthetic).  You may have a tube placed through your nose and into your stomach (nasogastric tube) to drain your stomach fluids.  You may have a tube placed into your bladder (urinary catheter) to drain urine.  Your abdomen will be cleaned with a germ-killing solution (antiseptic).  The surgeon will make a surgical cut (incision) in your abdomen. This is usually an up-and-down incision in the midsection of your abdomen. The incision will go through the inside lining of your abdomen (peritoneum).  Your surgeon will spread the incision wide enough to examine the inside of your abdomen.  The rest of the procedure will depend on what the surgeon finds: ? The surgeon will check all organs in your abdomen for damage or obstruction. Repairs will be made when possible. ? If there is blood in the abdomen, the surgeon will look for the source of the bleeding in order to stop it. ? If there is yellowish-white fluid (pus) or gastric fluids in your abdomen, the surgeon will check for an infection or a hole (perforation) in your digestive tract. ? If the surgeon finds infection, a drain may be placed to empty fluid that can build up in your abdomen after surgery. ? If there is a growth (tumor) inside your abdomen, the surgeon may remove a piece of the growth (biopsy) to examine  it under a microscope.  When all procedures are complete, the surgeon will close your abdomen with layers of stitches (sutures).  The incision through the skin of your abdomen will be closed with sutures or staples. What happens after the procedure?  Your blood pressure, heart rate, breathing rate, and blood oxygen level will be monitored often  until the medicines you were given have worn off.  You will continue to receive fluids and nutrition through your IV tube. This will stop when you can eat and drink on your own.  You may also get antibiotic medicine and pain medicine through your IV tube.  Your nasogastric tube may be removed when you start to pass gas.  Your urinary catheter may be removed when the anesthetic wears off. This information is not intended to replace advice given to you by your health care provider. Make sure you discuss any questions you have with your health care provider. Document Released: 10/18/2000 Document Revised: 07/01/2015 Document Reviewed: 09/10/2013 Elsevier Interactive Patient Education  2018 [employer].

## 2017-12-14 ENCOUNTER — Encounter (HOSPITAL_COMMUNITY): Payer: Self-pay

## 2017-12-14 ENCOUNTER — Other Ambulatory Visit (HOSPITAL_COMMUNITY): Payer: Self-pay | Admitting: Internal Medicine

## 2017-12-14 DIAGNOSIS — G620 Drug-induced polyneuropathy: Secondary | ICD-10-CM

## 2017-12-14 DIAGNOSIS — T451X5A Adverse effect of antineoplastic and immunosuppressive drugs, initial encounter: Principal | ICD-10-CM

## 2017-12-17 ENCOUNTER — Encounter (HOSPITAL_COMMUNITY)
Admission: RE | Admit: 2017-12-17 | Discharge: 2017-12-17 | Disposition: A | Discharge status: Home / Self Care | Payer: Medicaid Other | Source: Ambulatory Visit | Attending: General Surgery | Admitting: General Surgery

## 2017-12-18 ENCOUNTER — Ambulatory Visit: Payer: Medicaid Other | Admitting: Neurology

## 2017-12-19 ENCOUNTER — Encounter (HOSPITAL_COMMUNITY): Payer: Self-pay

## 2017-12-19 ENCOUNTER — Inpatient Hospital Stay (HOSPITAL_COMMUNITY)
Admission: RE | Admit: 2017-12-19 | Discharge: 2017-12-21 | DRG: 358 | Disposition: A | Discharge status: Home / Self Care | Payer: Medicaid Other | Source: Ambulatory Visit | Attending: General Surgery | Admitting: General Surgery

## 2017-12-19 ENCOUNTER — Inpatient Hospital Stay (HOSPITAL_COMMUNITY): Payer: Medicaid Other | Admitting: Anesthesiology

## 2017-12-19 ENCOUNTER — Encounter: Payer: Self-pay | Admitting: Hematology

## 2017-12-19 ENCOUNTER — Other Ambulatory Visit: Payer: Self-pay

## 2017-12-19 ENCOUNTER — Encounter (HOSPITAL_COMMUNITY)
Admission: RE | Disposition: A | Discharge status: Home / Self Care | Payer: Self-pay | Source: Ambulatory Visit | Attending: General Surgery

## 2017-12-19 DIAGNOSIS — I2510 Atherosclerotic heart disease of native coronary artery without angina pectoris: Secondary | ICD-10-CM | POA: Diagnosis present

## 2017-12-19 DIAGNOSIS — Z9049 Acquired absence of other specified parts of digestive tract: Secondary | ICD-10-CM

## 2017-12-19 DIAGNOSIS — J449 Chronic obstructive pulmonary disease, unspecified: Secondary | ICD-10-CM | POA: Diagnosis present

## 2017-12-19 DIAGNOSIS — Z9221 Personal history of antineoplastic chemotherapy: Secondary | ICD-10-CM

## 2017-12-19 DIAGNOSIS — F17210 Nicotine dependence, cigarettes, uncomplicated: Secondary | ICD-10-CM | POA: Diagnosis present

## 2017-12-19 DIAGNOSIS — K219 Gastro-esophageal reflux disease without esophagitis: Secondary | ICD-10-CM | POA: Diagnosis present

## 2017-12-19 DIAGNOSIS — E7800 Pure hypercholesterolemia, unspecified: Secondary | ICD-10-CM | POA: Diagnosis present

## 2017-12-19 DIAGNOSIS — F4310 Post-traumatic stress disorder, unspecified: Secondary | ICD-10-CM | POA: Diagnosis present

## 2017-12-19 DIAGNOSIS — Z85038 Personal history of other malignant neoplasm of large intestine: Secondary | ICD-10-CM

## 2017-12-19 DIAGNOSIS — K660 Peritoneal adhesions (postprocedural) (postinfection): Secondary | ICD-10-CM | POA: Diagnosis present

## 2017-12-19 DIAGNOSIS — C184 Malignant neoplasm of transverse colon: Secondary | ICD-10-CM | POA: Diagnosis not present

## 2017-12-19 DIAGNOSIS — Z79899 Other long term (current) drug therapy: Secondary | ICD-10-CM | POA: Diagnosis not present

## 2017-12-19 DIAGNOSIS — C800 Disseminated malignant neoplasm, unspecified: Secondary | ICD-10-CM

## 2017-12-19 DIAGNOSIS — C786 Secondary malignant neoplasm of retroperitoneum and peritoneum: Principal | ICD-10-CM | POA: Diagnosis present

## 2017-12-19 DIAGNOSIS — Z7951 Long term (current) use of inhaled steroids: Secondary | ICD-10-CM | POA: Diagnosis not present

## 2017-12-19 DIAGNOSIS — I10 Essential (primary) hypertension: Secondary | ICD-10-CM | POA: Diagnosis present

## 2017-12-19 DIAGNOSIS — Z9889 Other specified postprocedural states: Secondary | ICD-10-CM

## 2017-12-19 HISTORY — PX: LAPAROTOMY: SHX154

## 2017-12-19 SURGERY — LAPAROTOMY, EXPLORATORY
Anesthesia: General | Site: Abdomen

## 2017-12-19 MED ORDER — ONDANSETRON 4 MG PO TBDP: 4.0000 mg | ORAL_TABLET | Freq: Four times a day (QID) | ORAL | Status: DC | PRN

## 2017-12-19 MED ORDER — ONDANSETRON HCL 4 MG/2ML IJ SOLN: 4.0000 mg | Freq: Four times a day (QID) | INTRAMUSCULAR | Status: DC | PRN

## 2017-12-19 MED ORDER — ACETAMINOPHEN 325 MG PO TABS: 650.0000 mg | ORAL_TABLET | Freq: Four times a day (QID) | ORAL | Status: DC | PRN

## 2017-12-19 MED ORDER — ONDANSETRON HCL 4 MG/2ML IJ SOLN
INTRAMUSCULAR | Status: DC | PRN
  Administered 2017-12-19: 4 mg via INTRAVENOUS

## 2017-12-19 MED ORDER — OXYCODONE-ACETAMINOPHEN 5-325 MG PO TABS
1.0000 | ORAL_TABLET | ORAL | Status: DC | PRN
  Administered 2017-12-19 – 2017-12-20 (×5): 2 via ORAL
  Administered 2017-12-21: 1 via ORAL
  Filled 2017-12-19 (×5): qty 2
  Filled 2017-12-19: qty 1
  Filled 2017-12-19: qty 2

## 2017-12-19 MED ORDER — KETOROLAC TROMETHAMINE 30 MG/ML IJ SOLN
30.0000 mg | Freq: Once | INTRAMUSCULAR | Status: AC
  Administered 2017-12-19: 30 mg via INTRAVENOUS
  Filled 2017-12-19: qty 1

## 2017-12-19 MED ORDER — LORAZEPAM 2 MG/ML IJ SOLN: 1.0000 mg | INTRAMUSCULAR | Status: DC | PRN

## 2017-12-19 MED ORDER — FLUOXETINE HCL 20 MG PO CAPS
20.0000 mg | ORAL_CAPSULE | Freq: Two times a day (BID) | ORAL | Status: DC
  Administered 2017-12-19 – 2017-12-21 (×4): 20 mg via ORAL
  Filled 2017-12-19 (×4): qty 1

## 2017-12-19 MED ORDER — ENOXAPARIN SODIUM 60 MG/0.6ML ~~LOC~~ SOLN
50.0000 mg | SUBCUTANEOUS | Status: DC
  Administered 2017-12-20 – 2017-12-21 (×2): 50 mg via SUBCUTANEOUS
  Filled 2017-12-19 (×2): qty 0.6

## 2017-12-19 MED ORDER — 0.9 % SODIUM CHLORIDE (POUR BTL) OPTIME
TOPICAL | Status: DC | PRN
  Administered 2017-12-19: 2000 mL

## 2017-12-19 MED ORDER — SUCCINYLCHOLINE CHLORIDE 20 MG/ML IJ SOLN
INTRAMUSCULAR | Status: DC | PRN
  Administered 2017-12-19: 180 mg via INTRAVENOUS

## 2017-12-19 MED ORDER — KETOROLAC TROMETHAMINE 30 MG/ML IJ SOLN: 30.0000 mg | Freq: Four times a day (QID) | INTRAMUSCULAR | Status: DC | PRN

## 2017-12-19 MED ORDER — BUPIVACAINE LIPOSOME 1.3 % IJ SUSP
INTRAMUSCULAR | Status: AC
  Filled 2017-12-19: qty 20

## 2017-12-19 MED ORDER — SODIUM CHLORIDE 0.9 % IV SOLN
INTRAVENOUS | Status: DC
  Administered 2017-12-19 – 2017-12-20 (×2): via INTRAVENOUS

## 2017-12-19 MED ORDER — ROCURONIUM BROMIDE 50 MG/5ML IV SOSY
PREFILLED_SYRINGE | INTRAVENOUS | Status: DC | PRN
  Administered 2017-12-19: 40 mg via INTRAVENOUS
  Administered 2017-12-19: 20 mg via INTRAVENOUS

## 2017-12-19 MED ORDER — PROPOFOL 10 MG/ML IV BOLUS
INTRAVENOUS | Status: AC
  Filled 2017-12-19: qty 20

## 2017-12-19 MED ORDER — PROPOFOL 10 MG/ML IV BOLUS
INTRAVENOUS | Status: DC | PRN
  Administered 2017-12-19: 200 mg via INTRAVENOUS

## 2017-12-19 MED ORDER — CHLORHEXIDINE GLUCONATE CLOTH 2 % EX PADS: 6.0000 | MEDICATED_PAD | Freq: Once | CUTANEOUS | Status: DC

## 2017-12-19 MED ORDER — ACETAMINOPHEN 650 MG RE SUPP: 650.0000 mg | Freq: Four times a day (QID) | RECTAL | Status: DC | PRN

## 2017-12-19 MED ORDER — BUPROPION HCL ER (SR) 150 MG PO TB12
150.0000 mg | ORAL_TABLET | Freq: Every morning | ORAL | Status: DC
  Administered 2017-12-19 – 2017-12-21 (×3): 150 mg via ORAL
  Filled 2017-12-19 (×3): qty 1

## 2017-12-19 MED ORDER — DIPHENHYDRAMINE HCL 12.5 MG/5ML PO ELIX: 12.5000 mg | ORAL_SOLUTION | Freq: Four times a day (QID) | ORAL | Status: DC | PRN

## 2017-12-19 MED ORDER — FENTANYL CITRATE (PF) 250 MCG/5ML IJ SOLN
INTRAMUSCULAR | Status: AC
  Filled 2017-12-19: qty 5

## 2017-12-19 MED ORDER — SUCCINYLCHOLINE CHLORIDE 200 MG/10ML IV SOSY
PREFILLED_SYRINGE | INTRAVENOUS | Status: AC
  Filled 2017-12-19: qty 10

## 2017-12-19 MED ORDER — LACTATED RINGERS IV SOLN: INTRAVENOUS | Status: DC

## 2017-12-19 MED ORDER — ONDANSETRON HCL 4 MG/2ML IJ SOLN
INTRAMUSCULAR | Status: AC
  Filled 2017-12-19: qty 2

## 2017-12-19 MED ORDER — FENTANYL CITRATE (PF) 100 MCG/2ML IJ SOLN
INTRAMUSCULAR | Status: DC | PRN
  Administered 2017-12-19 (×9): 50 ug via INTRAVENOUS

## 2017-12-19 MED ORDER — NICOTINE 21 MG/24HR TD PT24
21.0000 mg | MEDICATED_PATCH | Freq: Every day | TRANSDERMAL | Status: DC
  Filled 2017-12-19 (×2): qty 1

## 2017-12-19 MED ORDER — ENOXAPARIN SODIUM 40 MG/0.4ML ~~LOC~~ SOLN: 40.0000 mg | SUBCUTANEOUS | Status: DC

## 2017-12-19 MED ORDER — CEFAZOLIN SODIUM-DEXTROSE 2-4 GM/100ML-% IV SOLN
2.0000 g | INTRAVENOUS | Status: AC
  Administered 2017-12-19: 2 g via INTRAVENOUS
  Filled 2017-12-19: qty 100

## 2017-12-19 MED ORDER — BUPIVACAINE LIPOSOME 1.3 % IJ SUSP
INTRAMUSCULAR | Status: DC | PRN
  Administered 2017-12-19: 20 mL

## 2017-12-19 MED ORDER — KETOROLAC TROMETHAMINE 30 MG/ML IJ SOLN
30.0000 mg | Freq: Four times a day (QID) | INTRAMUSCULAR | Status: AC
  Administered 2017-12-19: 30 mg via INTRAVENOUS
  Filled 2017-12-19: qty 1

## 2017-12-19 MED ORDER — HYDROMORPHONE HCL 1 MG/ML IJ SOLN: 0.2500 mg | INTRAMUSCULAR | Status: DC | PRN

## 2017-12-19 MED ORDER — HYDROCODONE-ACETAMINOPHEN 7.5-325 MG PO TABS: 1.0000 | ORAL_TABLET | Freq: Once | ORAL | Status: DC | PRN

## 2017-12-19 MED ORDER — MEPERIDINE HCL 50 MG/ML IJ SOLN: 6.2500 mg | INTRAMUSCULAR | Status: DC | PRN

## 2017-12-19 MED ORDER — SIMETHICONE 80 MG PO CHEW: 40.0000 mg | CHEWABLE_TABLET | Freq: Four times a day (QID) | ORAL | Status: DC | PRN

## 2017-12-19 MED ORDER — ALBUTEROL SULFATE (2.5 MG/3ML) 0.083% IN NEBU
3.0000 mL | INHALATION_SOLUTION | Freq: Four times a day (QID) | RESPIRATORY_TRACT | Status: DC | PRN
  Administered 2017-12-20: 3 mL via RESPIRATORY_TRACT
  Filled 2017-12-19: qty 3

## 2017-12-19 MED ORDER — MIDAZOLAM HCL 5 MG/5ML IJ SOLN
INTRAMUSCULAR | Status: DC | PRN
  Administered 2017-12-19: 2 mg via INTRAVENOUS

## 2017-12-19 MED ORDER — PROMETHAZINE HCL 25 MG/ML IJ SOLN: 6.2500 mg | INTRAMUSCULAR | Status: DC | PRN

## 2017-12-19 MED ORDER — ROCURONIUM BROMIDE 10 MG/ML (PF) SYRINGE
PREFILLED_SYRINGE | INTRAVENOUS | Status: AC
  Filled 2017-12-19: qty 10

## 2017-12-19 MED ORDER — SUGAMMADEX SODIUM 200 MG/2ML IV SOLN
INTRAVENOUS | Status: DC | PRN
  Administered 2017-12-19: 216 mg via INTRAVENOUS

## 2017-12-19 MED ORDER — POVIDONE-IODINE 10 % EX OINT
TOPICAL_OINTMENT | CUTANEOUS | Status: DC | PRN
  Administered 2017-12-19: 1 via TOPICAL

## 2017-12-19 MED ORDER — HYDROMORPHONE HCL 1 MG/ML IJ SOLN
1.0000 mg | INTRAMUSCULAR | Status: DC | PRN
  Administered 2017-12-19: 1 mg via INTRAVENOUS
  Filled 2017-12-19: qty 1

## 2017-12-19 MED ORDER — AMITRIPTYLINE HCL 25 MG PO TABS: 25.0000 mg | ORAL_TABLET | Freq: Every evening | ORAL | Status: DC | PRN

## 2017-12-19 MED ORDER — LACTATED RINGERS IV SOLN
INTRAVENOUS | Status: DC
  Administered 2017-12-19 (×2): via INTRAVENOUS

## 2017-12-19 MED ORDER — ATORVASTATIN CALCIUM 20 MG PO TABS
20.0000 mg | ORAL_TABLET | Freq: Every morning | ORAL | Status: DC
  Administered 2017-12-19 – 2017-12-21 (×3): 20 mg via ORAL
  Filled 2017-12-19 (×3): qty 1

## 2017-12-19 MED ORDER — POVIDONE-IODINE 10 % EX OINT
TOPICAL_OINTMENT | CUTANEOUS | Status: AC
  Filled 2017-12-19: qty 1

## 2017-12-19 MED ORDER — DIPHENHYDRAMINE HCL 50 MG/ML IJ SOLN: 12.5000 mg | Freq: Four times a day (QID) | INTRAMUSCULAR | Status: DC | PRN

## 2017-12-19 MED ORDER — MIDAZOLAM HCL 2 MG/2ML IJ SOLN
INTRAMUSCULAR | Status: AC
  Filled 2017-12-19: qty 2

## 2017-12-19 SURGICAL SUPPLY — 35 items
APPLIER CLIP 13 LRG OPEN (CLIP) ×3
CELLS DAT CNTRL 66122 CELL SVR (MISCELLANEOUS) ×1 IMPLANT
CHLORAPREP W/TINT 26ML (MISCELLANEOUS) ×3 IMPLANT
CLIP APPLIE 13 LRG OPEN (CLIP) ×1 IMPLANT
CLOTH BEACON ORANGE TIMEOUT ST (SAFETY) ×3 IMPLANT
COVER LIGHT HANDLE STERIS (MISCELLANEOUS) ×6 IMPLANT
DRAPE WARM FLUID 44X44 (DRAPE) ×3 IMPLANT
DRSG OPSITE POSTOP 4X10 (GAUZE/BANDAGES/DRESSINGS) ×3 IMPLANT
ELECT REM PT RETURN 9FT ADLT (ELECTROSURGICAL) ×3
ELECTRODE REM PT RTRN 9FT ADLT (ELECTROSURGICAL) ×1 IMPLANT
GAUZE SPONGE 4X4 12PLY STRL (GAUZE/BANDAGES/DRESSINGS) ×3 IMPLANT
GLOVE BIOGEL PI IND STRL 7.0 (GLOVE) ×4 IMPLANT
GLOVE BIOGEL PI INDICATOR 7.0 (GLOVE) ×8
GLOVE SURG SS PI 7.5 STRL IVOR (GLOVE) ×3 IMPLANT
GOWN STRL REUS W/TWL LRG LVL3 (GOWN DISPOSABLE) ×9 IMPLANT
HANDLE SUCTION POOLE (INSTRUMENTS) ×1 IMPLANT
INST SET MAJOR GENERAL (KITS) ×3 IMPLANT
KIT TURNOVER KIT A (KITS) ×3 IMPLANT
LIGASURE IMPACT 36 18CM CVD LR (INSTRUMENTS) ×3 IMPLANT
MANIFOLD NEPTUNE II (INSTRUMENTS) ×3 IMPLANT
NEEDLE HYPO 18GX1.5 BLUNT FILL (NEEDLE) ×3 IMPLANT
NS IRRIG 1000ML POUR BTL (IV SOLUTION) ×6 IMPLANT
PACK ABDOMINAL MAJOR (CUSTOM PROCEDURE TRAY) ×3 IMPLANT
PAD ARMBOARD 7.5X6 YLW CONV (MISCELLANEOUS) ×3 IMPLANT
RTRCTR WOUND ALEXIS 18CM MED (MISCELLANEOUS) ×3
SET BASIN LINEN APH (SET/KITS/TRAYS/PACK) ×3 IMPLANT
SPONGE LAP 18X18 RF (DISPOSABLE) ×6 IMPLANT
STAPLER VISISTAT (STAPLE) ×3 IMPLANT
SUCTION POOLE HANDLE (INSTRUMENTS) ×3
SUT PDS AB 0 CTX 60 (SUTURE) ×6 IMPLANT
SUT PROLENE 2 0 SH 30 (SUTURE) IMPLANT
SUT SILK 2 0 (SUTURE)
SUT SILK 2-0 18XBRD TIE 12 (SUTURE) IMPLANT
SUT SILK 3 0 SH CR/8 (SUTURE) ×3 IMPLANT
SYR 20CC LL (SYRINGE) ×3 IMPLANT

## 2017-12-19 NOTE — Anesthesia Postprocedure Evaluation (Signed)
Anesthesia Post Note  Patient: Erik [name]  Procedure(s) Performed: EXPLORATORY LAPAROTOMY (N/A Abdomen)  Patient location during evaluation: PACU Anesthesia Type: General Level of consciousness: awake and alert and oriented Pain management: pain level controlled Vital Signs Assessment: post-procedure vital signs reviewed and stable Respiratory status: spontaneous breathing Cardiovascular status: stable Postop Assessment: no apparent nausea or vomiting Anesthetic complications: no     Last Vitals:  Vitals:   12/19/17 1136 12/19/17 1137  BP:    Pulse:    Resp:    Temp:    SpO2: 92% 95%    Last Pain:  Vitals:   12/19/17 1121  TempSrc:   PainSc: 5                  Palmina Clodfelter A

## 2017-12-19 NOTE — Anesthesia Procedure Notes (Signed)
Procedure Name: Intubation Performed by: Andree Elk, Amy A, CRNA Pre-anesthesia Checklist: Patient identified, Patient being monitored, Timeout performed, Emergency Drugs available and Suction available Patient Re-evaluated:Patient Re-evaluated prior to induction Oxygen Delivery Method: Circle system utilized Preoxygenation: Pre-oxygenation with 100% oxygen Induction Type: IV induction, Rapid sequence and Cricoid Pressure applied Laryngoscope Size: Glidescope and 3 Grade View: Grade I Tube type: Oral Tube size: 7.0 mm Number of attempts: 1 Airway Equipment and Method: Stylet and Video-laryngoscopy Placement Confirmation: ETT inserted through vocal cords under direct vision,  positive ETCO2 and breath sounds checked- equal and bilateral Secured at: 21 cm Tube secured with: Tape Dental Injury: Teeth and Oropharynx as per pre-operative assessment

## 2017-12-19 NOTE — Interval H&P Note (Signed)
History and Physical Interval Note:  12/19/2017 9:23 AM  Erik [name]  has presented today for surgery, with the diagnosis of colon cancer  The various methods of treatment have been discussed with the patient and family. After consideration of risks, benefits and other options for treatment, the patient has consented to  Procedure(s): EXPLORATORY LAPAROTOMY (N/A) as a surgical intervention .  The patient's history has been reviewed, patient examined, no change in status, stable for surgery.  I have reviewed the patient's chart and labs.  Questions were answered to the patient's satisfaction.     Aviva Signs

## 2017-12-19 NOTE — Anesthesia Preprocedure Evaluation (Signed)
Anesthesia Evaluation    Airway Mallampati: II       Dental  (+) Teeth Intact, Chipped   Pulmonary COPD, Current Smoker, former smoker,    breath sounds clear to auscultation + decreased breath sounds      Cardiovascular hypertension, On Medications + CAD   Rhythm:regular     Neuro/Psych PSYCHIATRIC DISORDERS Anxiety Depression CVA    GI/Hepatic GERD  Medicated,  Endo/Other    Renal/GU      Musculoskeletal   Abdominal   Peds  Hematology   Anesthesia Other Findings TBI 2001, coma 4wk H/O heavy ETOH use/abuse reportedly stopped 2010 SAH/SDH 2014  Recent difficult a/w with ASA II eval, with 4-plus FB Thyromental distance.   Will use Glidescope today  Reproductive/Obstetrics                             Anesthesia Physical Anesthesia Plan  ASA: IV  Anesthesia Plan: General ETT   Post-op Pain Management:    Induction:   PONV Risk Score and Plan:   Airway Management Planned:   Additional Equipment:   Intra-op Plan:   Post-operative Plan:   Informed Consent:   Plan Discussed with: Anesthesiologist  Anesthesia Plan Comments:         Anesthesia Quick Evaluation

## 2017-12-19 NOTE — Transfer of Care (Signed)
Immediate Anesthesia Transfer of Care Note  Patient: Erik [name]  Procedure(s) Performed: EXPLORATORY LAPAROTOMY (N/A Abdomen)  Patient Location: PACU  Anesthesia Type:General  Level of Consciousness: awake, alert , oriented and patient cooperative  Airway & Oxygen Therapy: Patient Spontanous Breathing and Patient connected to face mask oxygen  Post-op Assessment: Report given to RN and Post -op Vital signs reviewed and stable  Post vital signs: Reviewed and stable  Last Vitals:  Vitals Value Taken Time  BP 171/98 12/19/2017 11:15 AM  Temp 37 C 12/19/2017 11:10 AM  Pulse 102 12/19/2017 11:18 AM  Resp 10 12/19/2017 11:18 AM  SpO2 100 % 12/19/2017 11:18 AM  Vitals shown include unvalidated device data.  Last Pain:  Vitals:   12/19/17 0924  TempSrc: Oral  PainSc: 0-No pain      Patients Stated Pain Goal: 6 (49/17/91 5056)  Complications: No apparent anesthesia complications

## 2017-12-19 NOTE — Op Note (Signed)
Patient:  Erik [name]  DOB:  [birth year]  MRN:  [id number]   Preop Diagnosis: Colon carcinoma, possible carcinomatosis  Postop Diagnosis: Same, carcinomatosis  Procedure: Exploratory laparotomy, biopsy of peritoneal implants  Surgeon: Aviva Signs, MD  Assistant: Blake Divine, MD  Anes: General endotracheal  Indications: Patient is a 62 year old white male status post extended right hemicolectomy for colon carcinoma who has undergone chemotherapy.  He now has a rising CEA level and possible carcinomatosis.  The patient has been referred for an exploratory laparotomy and biopsy.  Risks and benefits of the procedure including bleeding, infection, and the possibility of a bowel resection were fully explained to the patient, who gave informed consent.  Procedure note: The patient was placed in the supine position.  After induction of general endotracheal anesthesia, the abdomen was prepped and draped using the usual sterile technique with DuraPrep.  Surgical site confirmation was performed.  The midline incision was made just below the xiphoid process down towards the umbilicus through a previous surgical incision site.  Peritoneal cavity was entered into with some difficulty due to adhesions of small bowel to the peritoneal wall.  These were freed away using sharp dissection.  No serosal tear was ever made.  On inspection of the mesentery of the small bowel in this region, multiple peritoneal implants were found.  In addition, there were peritoneal implants that were tenting up the bowel in the left upper quadrant.  Multiple excisional biopsies of the implants were taken using the LigaSure.  They were sent to pathology for the examination.  No significant ascites was found.  These peritoneal implants seem to be along the mesentery and serosal surfaces.  Due to adhesions, the exploration was somewhat limited.  The bowel was limited to a inspected and returned into the abdominal cavity.  I  could not explore the pelvis due to adhesive disease.  The fascia was reapproximated using a looped 0 PDS running suture.  Subcutaneous layer was irrigated with normal saline.  Exparel was instilled into the surrounding wound.  The skin was closed using staples.  Betadine ointment and dry sterile dressings were applied.  All tape and needle counts were correct at the end of the procedure.  The patient was extubated in the operating room and transferred to PACU in stable condition.  Complications: None  EBL: Minimal  Specimen: Peritoneal implants

## 2017-12-20 ENCOUNTER — Encounter (HOSPITAL_COMMUNITY): Payer: Self-pay | Admitting: General Surgery

## 2017-12-20 LAB — BASIC METABOLIC PANEL
ANION GAP: 6 (ref 5–15)
BUN: 11 mg/dL (ref 6–20)
CALCIUM: 8.2 mg/dL — AB (ref 8.9–10.3)
CO2: 31 mmol/L (ref 22–32)
Chloride: 99 mmol/L (ref 98–111)
Creatinine, Ser: 0.86 mg/dL (ref 0.61–1.24)
GFR calc Af Amer: >60 mL/min (ref >60)
GFR calc non Af Amer: >60 mL/min (ref >60)
GLUCOSE: 106 mg/dL — AB (ref 70–99)
Potassium: 4.1 mmol/L (ref 3.5–5.1)
Sodium: 136 mmol/L (ref 135–145)

## 2017-12-20 LAB — CBC
HEMATOCRIT: 43.6 % (ref 39.0–52.0)
HEMOGLOBIN: 13.3 g/dL (ref 13.0–17.0)
MCH: 30.6 pg (ref 26.0–34.0)
MCHC: 30.5 g/dL (ref 30.0–36.0)
MCV: 100.5 fL — ABNORMAL HIGH (ref 80.0–100.0)
NRBC: 0 % (ref 0.0–0.2)
Platelets: 210 10*3/uL (ref 150–400)
RBC: 4.34 MIL/uL (ref 4.22–5.81)
RDW: 13.7 % (ref 11.5–15.5)
WBC: 6.1 10*3/uL (ref 4.0–10.5)

## 2017-12-20 LAB — PHOSPHORUS: PHOSPHORUS: 3.3 mg/dL (ref 2.5–4.6)

## 2017-12-20 LAB — MAGNESIUM: Magnesium: 1.8 mg/dL (ref 1.7–2.4)

## 2017-12-20 MED ORDER — PANTOPRAZOLE SODIUM 40 MG PO TBEC
40.0000 mg | DELAYED_RELEASE_TABLET | Freq: Every day | ORAL | Status: DC
  Administered 2017-12-20 – 2017-12-21 (×2): 40 mg via ORAL
  Filled 2017-12-20 (×2): qty 1

## 2017-12-20 NOTE — Progress Notes (Signed)
1 Day Post-Op  Subjective: Patient having moderate incisional pain.  Has had a bowel movement.  Objective: Vital signs in last 24 hours: Temp:  [97.6 F (36.4 C)-98.6 F (37 C)] 97.7 F (36.5 C) (11/14 0640) Pulse Rate:  [86-111] 87 (11/14 0640) Resp:  [8-22] 12 (11/14 0640) BP: (112-171)/(67-98) 114/69 (11/14 0640) SpO2:  [80 %-100 %] 95 % (11/14 0640) Last BM Date: 12/19/17  Intake/Output from previous day: 11/13 0701 - 11/14 0700 In: 4098 [P.O.:240; I.V.:1009] Out: 375 [Urine:300; Blood:75] Intake/Output this shift: No intake/output data recorded.  General appearance: alert, cooperative and no distress Resp: clear to auscultation bilaterally Cardio: regular rate and rhythm, S1, S2 normal, no murmur, click, rub or gallop GI: Soft, incision healing well.  Bowel sounds active.  Lab Results:  Recent Labs    12/20/17 0613  WBC 6.1  HGB 13.3  HCT 43.6  PLT 210   BMET Recent Labs    12/20/17 0613  NA 136  K 4.1  CL 99  CO2 31  GLUCOSE 106*  BUN 11  CREATININE 0.86  CALCIUM 8.2*   PT/INR No results for input(s): LABPROT, INR in the last 72 hours.  Studies/Results: No results found.  Anti-infectives: Anti-infectives (From admission, onward)   Start     Dose/Rate Route Frequency Ordered Stop   12/19/17 0900  ceFAZolin (ANCEF) IVPB 2g/100 mL premix     2 g 200 mL/hr over 30 Minutes Intravenous On call to O.R. 12/19/17 0856 12/19/17 1002      Assessment/Plan: s/p Procedure(s): EXPLORATORY LAPAROTOMY Impression: Stable on postoperative day 1.  Diet being advanced as tolerated.  Is aware of diagnosis of recurrent colon cancer with carcinomatosis. Plan: We will start ambulating patient.  Anticipate discharge in next 24 to 48 hours.  LOS: 1 day    Aviva Signs 12/20/2017

## 2017-12-20 NOTE — Progress Notes (Signed)
During O2 check pt had an O2 saturation of 85% on RA. Placed ot on 1L [place] and O2 increased to 94%. Janett Billow RN notified

## 2017-12-20 NOTE — Anesthesia Postprocedure Evaluation (Signed)
Anesthesia Post Note  Patient: Erik [name]  Procedure(s) Performed: EXPLORATORY LAPAROTOMY (N/A Abdomen)  Patient location during evaluation: Nursing Unit Anesthesia Type: General Level of consciousness: awake and alert, oriented and patient cooperative Pain management: pain level controlled (Receiving percocet for pain relief with good results) Vital Signs Assessment: post-procedure vital signs reviewed and stable Respiratory status: spontaneous breathing and patient connected to nasal cannula oxygen (1L [place]) Cardiovascular status: stable Postop Assessment: no apparent nausea or vomiting Anesthetic complications: no     Last Vitals:  Vitals:   12/20/17 0834 12/20/17 0836  BP:    Pulse:    Resp:    Temp:    SpO2: (!) 85% 94%    Last Pain:  Vitals:   12/20/17 0956  TempSrc:   PainSc: Asleep                 Karter Haire A

## 2017-12-20 NOTE — Addendum Note (Signed)
Addendum  created 12/20/17 1111 by Mickel Baas, CRNA   Sign clinical note

## 2017-12-20 NOTE — Progress Notes (Signed)
Pt encouraged multiple times throughout shift to ambulate with staff. Pt continues to refuse. Pt noted to have a congested cough. Abdomen bracing with pillow encouraged. Pt continues to be educated regarding the importance of ambulation and IS to prevent pneumonia post surgery. Pt verbalizes understanding.

## 2017-12-21 MED ORDER — OXYCODONE-ACETAMINOPHEN 7.5-325 MG PO TABS: 1.0000 | ORAL_TABLET | ORAL | 0 refills | Status: DC | PRN

## 2017-12-21 NOTE — Progress Notes (Signed)
Pt discharged home today per Dr. Arnoldo Morale. Pt's IV site D/C'd and WDL. Pt's VSS. Pt provided with home medication list, discharge instructions and prescriptions. Verbalized understanding. Pt left floor via WC in stable condition accompanied by NT.

## 2017-12-21 NOTE — Discharge Summary (Signed)
Physician Discharge Summary  Patient ID: Erik [name] MRN: [id number] DOB/AGE: Aug 06, 1955 62 y.o.  Admit date: 12/19/2017 Discharge date: 12/21/2017  Admission Diagnoses: Colon carcinoma, possible carcinomatosis  Discharge Diagnoses: Same, carcinomatosis Active Problems:   Carcinomatosis (Erik [name])   S/P exploratory laparotomy Colon cancer, COPD, history of head trauma, PTSD  Discharged Condition: good  Hospital Course: Patient is a 62 year old white male with a history of colon cancer, status post extended right hemicolectomy in the past who now presents for exploratory laparotomy due to abnormal findings on CT and PET scans.  The patient underwent exploratory laparotomy with biopsy of peritoneal nodules on 12/19/2017.  He tolerated the procedure well.  His postoperative course has been unremarkable.  His diet was advanced without difficulty.  Final pathology did confirm carcinomatosis.  The patient is aware of the diagnosis.  Dr. Walden Field is aware of the diagnosis.  Patient is being discharged home on 12/21/2017 in good and improving condition.  Treatments: surgery: Exploratory laparotomy, biopsy of peritoneal lesions on 12/19/2017  Discharge Exam: Blood pressure (!) 153/86, pulse 94, temperature 98.1 F (36.7 C), temperature source Oral, resp. rate 18, SpO2 92 %. General appearance: alert, cooperative and no distress Resp: clear to auscultation bilaterally Cardio: regular rate and rhythm, S1, S2 normal, no murmur, click, rub or gallop GI: Soft, incision healing well.  Bowel sounds active.  Disposition: Discharge disposition: 01-Home or Self Care       Discharge Instructions    Diet - low sodium heart healthy   Complete by:  As directed    Increase activity slowly   Complete by:  As directed      Allergies as of 12/21/2017   No Known Allergies     Medication List    TAKE these medications   amitriptyline 25 MG tablet Commonly known as:  ELAVIL TAKE 1 TABLET BY MOUTH  EVERY NIGHT AT BEDTIME What changed:    when to take this  reasons to take this   atorvastatin 20 MG tablet Commonly known as:  LIPITOR Take 1 tablet (20 mg total) by mouth at bedtime. What changed:  when to take this   buPROPion 150 MG 12 hr tablet Commonly known as:  WELLBUTRIN SR Take 150 mg by mouth every morning.   FLUoxetine 20 MG capsule Commonly known as:  PROZAC TAKE 1 CAPSULE BY MOUTH 2 TIMES A DAY What changed:    how much to take  how to take this  when to take this  additional instructions   gabapentin 300 MG capsule Commonly known as:  NEURONTIN TAKE 1 CAPSULE BY MOUTH IN THE MORNING AND 3 CAPSULES BY MOUTH AT BEDTIME   loperamide 2 MG capsule Commonly known as:  IMODIUM TAKE 1 TO 2 CAPSULES BY MOUTH EVERY 6 HOURS AS NEEDED FOR DIARRHEA OR LOOSE STOOLS What changed:  See the new instructions.   omeprazole 20 MG capsule Commonly known as:  PRILOSEC Take 20 mg by mouth every morning.   oxyCODONE-acetaminophen 7.5-325 MG tablet Commonly known as:  PERCOCET Take 1 tablet by mouth every 4 (four) hours as needed for severe pain.   PROAIR HFA 108 (90 Base) MCG/ACT inhaler Generic drug:  albuterol INHALE 2 PUFFS BY MOUTH EVERY 6 HOURS AS NEEDED FOR SHORTNESS OF BREATH/WHEEZING. What changed:  See the new instructions.      Follow-up Information    Aviva Signs, MD. Schedule an appointment as soon as possible for a visit on 01/01/2018.   Specialty:  General Surgery Contact information:  [id number] [place] [address] [phone]           Signed: Aviva Signs 12/21/2017, 8:53 AM

## 2017-12-24 ENCOUNTER — Ambulatory Visit (HOSPITAL_COMMUNITY): Payer: Medicaid Other | Admitting: Internal Medicine

## 2017-12-26 ENCOUNTER — Encounter (HOSPITAL_COMMUNITY): Payer: Self-pay | Admitting: Internal Medicine

## 2017-12-26 ENCOUNTER — Other Ambulatory Visit: Payer: Self-pay

## 2017-12-26 ENCOUNTER — Encounter (HOSPITAL_COMMUNITY): Payer: Self-pay | Admitting: *Deleted

## 2017-12-26 ENCOUNTER — Inpatient Hospital Stay (HOSPITAL_COMMUNITY): Payer: Medicaid Other | Attending: Hematology | Admitting: Internal Medicine

## 2017-12-26 ENCOUNTER — Other Ambulatory Visit (HOSPITAL_COMMUNITY): Payer: Self-pay | Admitting: Internal Medicine

## 2017-12-26 VITALS — BP 135/74 | HR 90 | Temp 97.9°F | Resp 16 | Wt 235.7 lb

## 2017-12-26 DIAGNOSIS — R918 Other nonspecific abnormal finding of lung field: Secondary | ICD-10-CM | POA: Insufficient documentation

## 2017-12-26 DIAGNOSIS — C184 Malignant neoplasm of transverse colon: Secondary | ICD-10-CM | POA: Diagnosis present

## 2017-12-26 DIAGNOSIS — I10 Essential (primary) hypertension: Secondary | ICD-10-CM | POA: Diagnosis not present

## 2017-12-26 DIAGNOSIS — Z9049 Acquired absence of other specified parts of digestive tract: Secondary | ICD-10-CM

## 2017-12-26 DIAGNOSIS — Z87891 Personal history of nicotine dependence: Secondary | ICD-10-CM | POA: Diagnosis not present

## 2017-12-26 DIAGNOSIS — R0989 Other specified symptoms and signs involving the circulatory and respiratory systems: Secondary | ICD-10-CM

## 2017-12-26 DIAGNOSIS — C772 Secondary and unspecified malignant neoplasm of intra-abdominal lymph nodes: Secondary | ICD-10-CM

## 2017-12-26 DIAGNOSIS — C188 Malignant neoplasm of overlapping sites of colon: Secondary | ICD-10-CM | POA: Insufficient documentation

## 2017-12-26 DIAGNOSIS — Z79899 Other long term (current) drug therapy: Secondary | ICD-10-CM | POA: Diagnosis not present

## 2017-12-26 HISTORY — DX: Malignant neoplasm of overlapping sites of colon: C18.8

## 2017-12-26 HISTORY — DX: Malignant neoplasm of overlapping sites of colon: C77.2

## 2017-12-26 NOTE — Progress Notes (Signed)
Diagnosis No diagnosis found.  Staging Cancer Staging Malignant neoplasm of transverse colon [facility]) Staging form: Colon and Rectum, AJCC 7th Edition - Pathologic stage from 03/04/2015: Stage IIIC (T4a, N2a, cM0) - Signed by Baird Cancer, PA-C on 03/04/2015   Assessment and Plan:  1. Stage IIIC adenocarcinoma of transverse colon.  62 yr old male previously followed by Dr. Talbert Cage.  He was diagnosed in 01/2015 on screening colonoscopy.  CEA elevated at time of diagnosis at 6.6. Treated with definitve surgery with right hemicolectomy by Dr. Adonis Huguenin on 02/12/15. Went on to have adjuvant chemo with FOLFOX x 12 cycles; chemo course was complicated by progressive peripheral neuropathy and thus Oxaliplatin was dose-reduced for cycles #6-11, and then d/c'd for cycle #12.   Pt had colonoscopy in 01/2016 with Dr. Oneida Alar. Path revealed 1 hyperplastic polyp.  No evidence of malignancy.    Pt was recommended for CT imaging but has not had that performed due to inability to get scan approved.    CT chest, abdomen and pelvis was recommended as his last imaging was in 10/2016 for ongoing assessment due to symptoms.    Pt had CT CAP done 10/23/2017 showed  IMPRESSION: 1. Subtle but new nodularity along the upper omentum along with nodular density in the pelvic mesentery adjacent to the sigmoid colon, concerning for peritoneal and omental implants of tumor. 2. Mild wall thickening in the sigmoid colon could reflect a low-grade colitis. 3. Trace perihepatic ascites. 4. Other imaging findings of potential clinical significance: Aortic Atherosclerosis (ICD10-I70.0) and Emphysema (ICD10-J43.9). Coronary atherosclerosis. Airway thickening is present, suggesting bronchitis or reactive airways disease. Degenerative right glenohumeral arthropathy with free osteochondral fragment in the subscapular recess. Nonobstructive single bilateral punctate renal calculi. Prior right hemicolectomy. Mild prostatomegaly. Mild  impingement at L4-5 and L5-S1.  Pt was recommended for PET scan which was done on 11/19/2017 that showed  IMPRESSION: 1. Similar to 10/23/2017 is progressive peritoneal nodularity worrisome for peritoneal carcinomatosis. This appears new when compared with 10/19/2016. 2. Increase in size of right paratracheal lymph node with mild FDG uptake. Nonspecific. There is also mild nonspecific increased uptake within the right hilar region. 3. Reticulonodular densities identified within both lungs with an upper lobe predominance are favored to represent postinflammatory changes. 4. Aortic Atherosclerosis (ICD10-I70.0) and Emphysema (ICD10-J43.9). 5. Coronary artery atherosclerotic calcifications.  Labs done 11/16/2017 reviewed and showed WBC 8.5 HB 14.8 and plts 192,000.  Chemistries WNL with K+ 3.8 Cr 0.84 and normal LFTs  CEA elevated at 12.9.  Previously, Scan discussed with Dr. Oneida Alar and IR.  Dr. Markus Daft  reviewed CT scan and pt was referred to IR for review for possible CT biopsy of omental lesions.   Biopsy was attempted on 11/30/2017 but was unsuccessful.    Pt was referred to Dr. Arnoldo Morale of surgery and underwent peritoneal biopsy of implants on 12/19/2017 with pathology returning as adenocarcinoma consistent with prior CRC.    Long talk held with pt today.    I have discussed with pt multicenter trials have shown benefit of standard regimen of FolFIRI  in pts who have progressed after prior exposure to Oxaliplatin.  I have also discussed with him addition of avastin for targeted therapy for metastatic CRC.  Side effects of FOLFIRI and Avastin discussed with pt that includes diarrhea, bleeding, hypertension.  Goals of therapy discussed which is disease control.  Pt will have repeat imaging after 3 months of therapy.  Pt will be set up for [facility] placement with Dr. Arnoldo Morale.  CRC biomarker testing requested.  68f is dosed at 400 mg/m2 with 5FU 2400 mg/m2 CIVI over 46 hours, Leucovorin 400 mg/m2,  Irinotecan 180 mg/m2, Avastin 5 mg/kg.  Avastin will not begin until C2 or C3 to allow healing from recent surgery due to potential risk for bleeding.  Treatment will be every 2 weeks.  All questions answered and he expressed understanding of the information presented.   Pt will be seen for follow-up prior to C2.    2.  LUL pulmonary nodule/ Right paratracheal LN.  Pt had 6 mm nodule on scan done 04/2016.  CT chest done 10/23/2017 showed stable nodule.  Pet scan done 11/19/2017 shows paratracheal LN is nonspecific and measures 9 mm.  Pt will have repeat imaging after 3 months of therapy.    3.  Cough and congestion.  Pt is a smoker.  Smoking cessation recommended.   4  HTN.  BP is 135/74/   Follow-up with PCP. Will monitor BP as Avastin proceeds.    5.  Smoking.  Cessation is recommended.    6.  Neuropathy.  Continue neurontin.    25 minutes spent with more than 50% spent in counseling and coordination of care.    Interval History:  From TKirby Crigler PA-C's last note on 06/14/16)    Current Status:  Pt is seen today for follow-up to go over biopsy results.    Oncology History   Stage IIIC ([facility] adenocarcinoma of transverse colon, diagnosed on colonoscopy by Dr. FOneida Alaron 01/12/2015 followed by definitive surgery by Dr. CClayburn Pertwith right hemicolectomy on 02/12/2015.  He then underwent FOLFOX x 12 cycles in the adjuvant setting (03/30/2015- 09/06/2015).     [place]   01/12/2015 Pathologic Stage    Colon, biopsy, distal transverse - TUBULOVILLOUS ADENOMA WITH HIGH GRADE DYSPLASIA.    01/12/2015 Procedure    Colonoscopy by Dr. FOneida Alar    01/12/2015 Tumor Marker    CEA: 6.6 (H)     01/18/2015 Imaging    CT abd/pelvis- Apple-core lesion identified in the mid transverse colon without obstruction. No evidence for lymphadenopathy in the gastrohepatic ligament or omentum.  Stable 8 mm hypo attenuating lesion in the left liver, likely a cyst.     02/10/2015 Initial Diagnosis    [place]    02/12/2015 Definitive Surgery    CClayburn Pert Extended right hemicolectomy     02/12/2015 Pathology Results    Mucinous adenocarcinoma with penetration of visceral peritoneum, 4/19 lymph nodes for metastatic disease, negative resection margins, with LVI and perineural invasion    03/30/2015 - 09/06/2015 Chemotherapy    FOLFOX x 12 cycles    05/25/2015 Treatment Plan Change    5 FU bolus discontinued for cycle #5    06/08/2015 Treatment Plan Change    Treatment deferred x 1 week    06/15/2015 Treatment Plan Change    5FU CI decreased by 10% and Oxaliplatin reduced by 15% for cycles #6-#11; Oxaliplatin dropped for cycle #12 d/t neuropathy.     10/20/2015 Imaging    CT CAP- Right hemicolectomy without evidence of metastatic disease. 2. Previously measured ground-glass lesion in the left upper lobe has resolved. 3. Probable food debris in the stomach, simulating gastric wall thickening. Please correlate clinically. 4. 6 mm irregular nodular density in the left upper lobe, stable. Continued attention on followup exams is warranted.    01/25/2016 Procedure    Colonoscopy by Dr. FOneida Alar Non-thrombosed external hemorrhoids found  on digital rectal exam. - One 4 mm polyp in the rectum, removed with a cold biopsy forceps. Resected and retrieved. - Congested mucosa in the neo-terminal ileum. Biopsied. - Redundant colon. - Internal hemorrhoids.    01/26/2016 Pathology Results    1. Terminal ileum, biopsy - MILD ACUTE (ACTIVE) ILEITIS. - NO DYSPLASIA OR MALIGNANCY IDENTIFIED - SEE COMMENT. 2. Rectum, polyp(s) - HYPERPLASTIC POLYP (X 1). - NO DYSPLASIA OR MALIGNANCY IDENTIFIED.    04/13/2016 Imaging    CT chest- Stable CT chest. 6 mm irregular nodule anterior left upper lobe is stable. The scattered areas of peribronchovascular micro nodularity in the lungs bilaterally are unchanged.    10/19/2016 Imaging    CT  CAP: 1. Status post right hemicolectomy. No findings to suggest metastatic disease in the abdomen or pelvis. 2. Aortic atherosclerosis. 3. Additional incidental findings, as above. Aortic Atherosclerosis (ICD10-I70.0).      Problem List Patient Active Problem List   Diagnosis Date Noted  . S/P exploratory laparotomy [Z98.890] 12/19/2017  . Carcinomatosis ([place]) [C80.0]   . Psoriasis [L40.9] 09/12/2016  . Pulmonary nodule [R91.1] 06/14/2016  . History of colon cancer [Z85.038]   . Diarrhea [R19.7] 01/06/2016  . Cigarette nicotine dependence with nicotine-induced disorder [F17.219] 06/18/2015  . Chronic obstructive pulmonary disease ([place]) [J44.9] 06/18/2015  . Depression [F32.9] 06/18/2015  . B12 deficiency [E53.8] 04/14/2015  . Malignant neoplasm of transverse colon ([place]) [C18.4] 02/10/2015  . GERD (gastroesophageal reflux disease) [K21.9] 12/15/2014  . History of colonic polyps [Z86.010] 12/15/2014  . History of alcohol abuse [F10.11] 09/16/2014  . Panic disorder [F41.0] 10/10/2012  . Major depressive disorder, single episode [F32.9] 10/10/2012  . Bilateral lower extremity edema [R60.0] 04/24/2012  . Hyperlipidemia [E78.5] 04/21/2009  . CAD, NATIVE VESSEL [I25.10] 03/25/2009  . HEAD TRAUMA [S09.90XA] 03/02/2009    Past Medical History Past Medical History:  Diagnosis Date  . Abnormal stress echocardiogram   . Adenocarcinoma of transverse colon ([place]) 02/10/2015  . Alcohol abuse    Heavy Use up until 2010  . Anxiety   . Blood transfusion without reported diagnosis   . COPD (chronic obstructive pulmonary disease) ([place])   . Depression   . Emphysema of lung ([place])   . GERD (gastroesophageal reflux disease)   . Head trauma 2001   closed head injury; coma for 4 weeks  . Hypercholesterolemia   . Hypertension   . ING HERN W/GANGREN RECUR UNILAT/UNSPEC ING HERN 04/21/2009   Qualifier: Diagnosis of  By: Verl Blalock, MD, Delanna Ahmadi PTSD (post-traumatic stress disorder)   .  Pulmonary nodule 06/14/2016  . SAH (subarachnoid hemorrhage) ([place]) 08/31/2012  . SDH (subdural hematoma) ([place]) 08/31/2012    Past Surgical History Past Surgical History:  Procedure Laterality Date  . BIOPSY  01/25/2016   Procedure: BIOPSY;  Surgeon: Danie Binder, MD;  [facility];  Service: Endoscopy;;  ileum;   . cardiac cath    . COLONOSCOPY  2011   Dr. Oneida Alar: multiple adenomas and hyperplastic polyps  . COLONOSCOPY WITH PROPOFOL N/A 01/12/2015   Procedure: COLONOSCOPY WITH PROPOFOL;  Surgeon: Danie Binder, MD;  [facility];  Service: Endoscopy;  Laterality: N/A;  1030  . COLONOSCOPY WITH PROPOFOL N/A 01/25/2016   Procedure: COLONOSCOPY WITH PROPOFOL;  Surgeon: Danie Binder, MD;  [facility];  Service: Endoscopy;  Laterality: N/A;  1230  . CRANIOTOMY  2001  . ESOPHAGOGASTRODUODENOSCOPY (EGD) WITH PROPOFOL N/A 01/12/2015   Procedure: ESOPHAGOGASTRODUODENOSCOPY (EGD) WITH  PROPOFOL;  Surgeon: Danie Binder, MD;  [facility];  Service: Endoscopy;  Laterality: N/A;  . head injury surgery    . HERNIA REPAIR Right 2012   Inguinal- [place] Na  . KIDNEY SURGERY     >30 years ago  . LAPAROSCOPIC RIGHT HEMI COLECTOMY Left 02/10/2015   Procedure: LAPAROSCOPIC THEN OPEN RIGHT HEMI COLECTOMY;  Surgeon: [name], MD;  Location: [facility];  Service: General;  Laterality: Left;  . LAPAROTOMY N/A 12/19/2017   Procedure: EXPLORATORY LAPAROTOMY;  Surgeon: Aviva Signs, MD;  Location: [facility];  Service: General;  Laterality: N/A;  . POLYPECTOMY  01/25/2016   Procedure: POLYPECTOMY;  Surgeon: Danie Binder, MD;  [facility];  Service: Endoscopy;;  colon  . PORT-A-CATH REMOVAL N/A 02/26/2017   Procedure: MINOR REMOVAL PORT-A-CATH;  Surgeon: Aviva Signs, MD;  Location: [facility];  Service: General;  Laterality: N/A;  Pt to arrive at 8:00am  . PORTACATH PLACEMENT N/A 03/24/2015   Procedure: INSERTION PORT-A-CATH;  Surgeon: Jules Husbands, MD;   Location: [facility] OR[facility];  Service: General;  Laterality: N/A;    Family History Family History  Problem Relation Age of Onset  . Pulmonary embolism Mother   . Breast cancer Mother   . Cancer Mother        breast cancer  . Arthritis Mother   . Heart disease Father   . Cancer Father 63       Leukemia  . Cancer Paternal Grandfather        Lung  . Ataxia Neg Hx   . Chorea Neg Hx   . Dementia Neg Hx   . Mental retardation Neg Hx   . Migraines Neg Hx   . Multiple sclerosis Neg Hx   . Neurofibromatosis Neg Hx   . Neuropathy Neg Hx   . Parkinsonism Neg Hx   . Seizures Neg Hx   . Stroke Neg Hx   . Colon cancer Neg Hx      Social History  reports that he quit smoking about 2 weeks ago. His smoking use included cigarettes. He started smoking about 44 years ago. He has a 3.00 pack-year smoking history. He has never used smokeless tobacco. He reports that he drinks alcohol. He reports that he does not use drugs.  Medications  Current Outpatient Medications:  .  amitriptyline (ELAVIL) 25 MG tablet, TAKE 1 TABLET BY MOUTH EVERY NIGHT AT BEDTIME (Patient taking differently: Take 25 mg by mouth at bedtime as needed for sleep. ), Disp: 30 tablet, Rfl: 0 .  atorvastatin (LIPITOR) 20 MG tablet, Take 1 tablet (20 mg total) by mouth at bedtime. (Patient taking differently: Take 20 mg by mouth every morning. ), Disp: 90 tablet, Rfl: 3 .  buPROPion (WELLBUTRIN SR) 150 MG 12 hr tablet, Take 150 mg by mouth every morning. , Disp: , Rfl:  .  FLUoxetine (PROZAC) 20 MG capsule, TAKE 1 CAPSULE BY MOUTH 2 TIMES A DAY (Patient taking differently: Take 20 mg by mouth 2 (two) times daily. ), Disp: 60 capsule, Rfl: 0 .  gabapentin (NEURONTIN) 300 MG capsule, TAKE 1 CAPSULE BY MOUTH IN THE MORNING AND 3 CAPSULES BY MOUTH AT BEDTIME, Disp: 120 capsule, Rfl: 0 .  loperamide (IMODIUM) 2 MG capsule, TAKE 1 TO 2 CAPSULES BY MOUTH EVERY 6 HOURS AS NEEDED FOR DIARRHEA OR LOOSE STOOLS (Patient taking differently: Take 2 mg  by mouth every morning. ), Disp: 60 capsule, Rfl: 0 .  omeprazole (PRILOSEC) 20 MG  capsule, Take 20 mg by mouth every morning. , Disp: , Rfl:  .  oxyCODONE-acetaminophen (PERCOCET) 7.5-325 MG tablet, Take 1 tablet by mouth every 4 (four) hours as needed for severe pain., Disp: 25 tablet, Rfl: 0 .  PROAIR HFA 108 (90 Base) MCG/ACT inhaler, INHALE 2 PUFFS BY MOUTH EVERY 6 HOURS AS NEEDED FOR SHORTNESS OF BREATH/WHEEZING. (Patient taking differently: Inhale 2 puffs into the lungs every 6 (six) hours as needed for wheezing or shortness of breath. ), Disp: 18 each, Rfl: 2  Allergies Patient has no known allergies.  Review of Systems Review of Systems - Oncology ROS minor discomfort from recent surgery   Physical Exam  Vitals Wt Readings from Last 3 Encounters:  12/26/17 235 lb 11.2 oz (106.9 kg)  12/13/17 238 lb (108 kg)  12/05/17 238 lb (108 kg)   Temp Readings from Last 3 Encounters:  12/26/17 97.9 F (36.6 C) (Oral)  12/21/17 98.1 F (36.7 C) (Oral)  12/13/17 (!) 96.4 F (35.8 C) (Temporal)   BP Readings from Last 3 Encounters:  12/26/17 135/74  12/21/17 (!) 153/86  12/13/17 (!) 186/114   Pulse Readings from Last 3 Encounters:  12/26/17 90  12/21/17 94  12/13/17 (!) 116   Constitutional: Well-developed, well-nourished, and in no distress.   HENT: Head: Normocephalic and atraumatic.  Mouth/Throat: No oropharyngeal exudate. Mucosa moist. Eyes: Pupils are equal, round, and reactive to light. Conjunctivae are normal. No scleral icterus.  Neck: Normal range of motion. Neck supple. No JVD present.  Cardiovascular: Normal rate, regular rhythm and normal heart sounds.  Exam reveals no gallop and no friction rub.   No murmur heard. Pulmonary/Chest: Effort normal and breath sounds normal. No respiratory distress. No wheezes.No rales.  Abdominal: Soft. Bowel sounds are normal. No distension. There is no tenderness. There is no guarding. Midline abdominal incision site with staples,  covered.   Musculoskeletal: No edema or tenderness.  Lymphadenopathy: No cervical, axillary or supraclavicular adenopathy.  Neurological: Alert and oriented to person, place, and time. No cranial nerve deficit.  Skin: Skin is warm and dry. No rash noted. No erythema. No pallor.  Psychiatric: Affect and judgment normal.   Labs No visits with results within 3 Day(s) from this visit.  Latest known visit with results is:  Admission on 12/19/2017, Discharged on 12/21/2017  Component Date Value Ref Range Status  . Sodium 12/20/2017 136  135 - 145 mmol/L Final  . Potassium 12/20/2017 4.1  3.5 - 5.1 mmol/L Final  . Chloride 12/20/2017 99  98 - 111 mmol/L Final  . CO2 12/20/2017 31  22 - 32 mmol/L Final  . Glucose, Bld 12/20/2017 106* 70 - 99 mg/dL Final  . BUN 12/20/2017 11  6 - 20 mg/dL Final  . Creatinine, Ser 12/20/2017 0.86  0.61 - 1.24 mg/dL Final  . Calcium 12/20/2017 8.2* 8.9 - 10.3 mg/dL Final  . GFR calc non Af Amer 12/20/2017 >60  >60 mL/min Final  . GFR calc Af Amer 12/20/2017 >60  >60 mL/min Final   Comment: (NOTE) The eGFR has been calculated using the CKD EPI equation. This calculation has not been validated in all clinical situations. eGFR's persistently <60 mL/min signify possible Chronic Kidney Disease.   Georgiann Hahn gap 12/20/2017 6  5 - 15 Final   Performed at [facility], [address], [place], [place] [address]  . Magnesium 12/20/2017 1.8  1.7 - 2.4 mg/dL Final   Performed at [facility], [address] [place], [place] [address]  .  Phosphorus 12/20/2017 3.3  2.5 - 4.6 mg/dL Final   Performed at [facility], [address] [place], [place] [address]  . WBC 12/20/2017 6.1  4.0 - 10.5 K/uL Final  . RBC 12/20/2017 4.34  4.22 - 5.81 MIL/uL Final  . Hemoglobin 12/20/2017 13.3  13.0 - 17.0 g/dL Final  . HCT 12/20/2017 43.6  39.0 - 52.0 % Final  . MCV 12/20/2017 100.5* 80.0 - 100.0 fL Final  . MCH 12/20/2017 30.6  26.0 - 34.0 pg Final  . MCHC 12/20/2017 30.5  30.0 - 36.0  g/dL Final  . RDW 12/20/2017 13.7  11.5 - 15.5 % Final  . Platelets 12/20/2017 210  150 - 400 K/uL Final  . nRBC 12/20/2017 0.0  0.0 - 0.2 % Final   Performed at [facility], [address], [place], [place] [address]     Pathology No orders of the defined types were placed in this encounter.      Zoila Shutter MD

## 2017-12-26 NOTE — Patient Instructions (Signed)
[facility] at [facility]  Discharge Instructions: You saw Dr. Higgs today                               _______________________________________________________________  Thank you for choosing [facility] at [facility] to provide your oncology and hematology care.  To afford each patient quality time with our providers, please arrive at least 15 minutes before your scheduled appointment.  You need to re-schedule your appointment if you arrive 10 or more minutes late.  We strive to give you quality time with our providers, and arriving late affects you and other patients whose appointments are after yours.  Also, if you no show three or more times for appointments you may be dismissed from the clinic.  Again, thank you for choosing [facility] at [facility]. Our hope is that these requests will allow you access to exceptional care and in a timely manner. _______________________________________________________________  If you have questions after your visit, please contact our office at [phone] between the hours of 8:30 a.m. and 5:00 p.m. Voicemails left after 4:30 p.m. will not be returned until the following business day. _______________________________________________________________  For prescription refill requests, have your pharmacy contact our office. _______________________________________________________________  Recommendations made by the consultant and any test results will be sent to your referring physician. _______________________________________________________________ 

## 2017-12-26 NOTE — Progress Notes (Signed)
I spoke with Erik [name] in pathology and ordered foundation one and PDL-1 on accession # [id number]. DX: C18.4 Stage IV

## 2017-12-27 MED ORDER — DEXAMETHASONE 4 MG PO TABS: 8.0000 mg | ORAL_TABLET | Freq: Every day | ORAL | 5 refills | Status: DC

## 2017-12-27 MED ORDER — LOPERAMIDE HCL 2 MG PO TABS: ORAL_TABLET | ORAL | 1 refills | Status: DC

## 2017-12-27 MED ORDER — LIDOCAINE-PRILOCAINE 2.5-2.5 % EX CREA: TOPICAL_CREAM | CUTANEOUS | 0 refills | Status: DC

## 2017-12-27 MED ORDER — ONDANSETRON HCL 8 MG PO TABS: 8.0000 mg | ORAL_TABLET | Freq: Two times a day (BID) | ORAL | 1 refills | Status: AC | PRN

## 2017-12-27 MED ORDER — PROCHLORPERAZINE MALEATE 10 MG PO TABS: 10.0000 mg | ORAL_TABLET | Freq: Four times a day (QID) | ORAL | 1 refills | Status: AC | PRN

## 2017-12-27 NOTE — Patient Instructions (Signed)
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 We are going to be treating you with the intent of controlling your disease. This means that your cancer is not curable but we can treat it.  We will be giving you chemotherapy every 14 days through your port a cath. You will go home with an ambulatory infusion pump and you will have to return to the cancer center in 2 days to have it discontinued.  We will be giving you Irinotecan (Camptosar), Fluorouracil (Adrucil, 5-FU), Leucovorin Calcium, Bevacizumab (Avastin).  You will see the doctor regularly throughout treatment.  We monitor your lab work prior to every treatment. The doctor monitors your response to treatment by the way you are feeling, your blood work, and scans periodically.  There will be wait times while you are here for treatment.  It will take about 30 minutes to 1 hour for your lab work to result.  Then there will be wait times while pharmacy mixes your medications.    You will have the following premedications prior to treatment:  Aloxi- nausea medication Dexamethasone- high powered steroid Atropine- medication to prevent or decrease diarrhea induced by chemotherapy   5-Fluorouracil (Adrucil)  About This Drug Fluorouracil is used to treat cancer. It is given in the vein (IV).  Possible Side Effects . Bone marrow suppression. This is a decrease in the number of white blood cells, red blood cells, and platelets. This may raise your risk of infection, make you tired and weak (fatigue), and raise your risk of bleeding . Changes in the tissue of the heart and/or heart attack. Some changes may happen that can cause your heart to have less ability to pump blood. . Blurred vision or other changes in eyesight . Nausea and throwing up (vomiting) . Diarrhea (loose bowel movements) . Ulcers - sores that may cause pain or bleeding in your digestive tract,  which includes your mouth, esophagus, stomach, small/large intestines and rectum . Soreness of the mouth and throat. You may have red areas, white patches, or sores that hurt. . Allergic reactions, including anaphylaxis are rare but may happen in some patients. Signs of allergic reaction to this drug may be swelling of the face, feeling like your tongue or throat are swelling, trouble breathing, rash, itching, fever, chills, feeling dizzy, and/or feeling that your heart is beating in a fast or not normal way. If this happens, do not take another dose of this drug. You should get urgent medical treatment. . Sensitivity to light (photosensitivity). Photosensitivity means that you may become more sensitive to the sun and/or light. You may get a skin rash/reaction if you are in the sun or are exposed to sun lamps and tanning beds. Your eyes may water more, mostly in bright light. . Changes in your nail color, nail loss and/or brittle nail . Darkening of the skin, or changes to the color of your skin and/or veins used for infusion . Rash, dry skin, or itching Note: Not all possible side effects are included above.  Warnings and Precautions . Hand-and-foot syndrome. The palms of your hands or soles of your feet may tingle, become numb, painful, swollen, or red. . Changes in your central nervous system can happen. The central nervous system is made up of your brain and spinal cord. You could feel extreme tiredness, agitation, confusion, hallucinations (see or hear things that are not there), trouble understanding or speaking, loss of control  of your bowels or bladder, eyesight changes, numbness or lack of strength to your arms, legs, face, or body, or coma. If you start to have any of these symptoms let your doctor know right away. . Side effects of this drug may be unexpectedly severe in some patients Note: Some of the side effects above are very rare. If you have concerns and/or questions,  please discuss them with your medical team.  Important Information . This drug may be present in the saliva, tears, sweat, urine, stool, vomit, semen, and vaginal secretions. Talk to your doctor and/or your nurse about the necessary precautions to take during this time.  Treating Side Effects . Manage tiredness by pacing your activities for the day. . Be sure to include periods of rest between energy-draining activities. . To help decrease the risk of infections, wash your hands regularly. . Avoid close contact with people who have a cold, the flu, or other infections. . Take your temperature as your doctor or nurse tells you, and whenever you feel like you may have a fever. . Use a soft toothbrush. Check with your nurse before using dental floss. . Be very careful when using knives or tools. . Use an electric shaver instead of a razor. . If you have a nose bleed, sit with your head tipped slightly forward. Apply pressure by lightly pinching the bridge of your nose between your thumb and forefinger. Call your doctor if you feel dizzy or faint or if the bleeding doesn't stop after 10 to 15 minutes. . Drink plenty of fluids (a minimum of eight glasses per day is recommended). . If you throw up or have loose bowel movements, you should drink more fluids so that you do not become dehydrated (lack of water in the body from losing too much fluid). . To help with nausea and vomiting, eat small, frequent meals instead of three large meals a day. Choose foods and drinks that are at room temperature. Ask your nurse or doctor about other helpful tips and medicine that is available to help, stop, or lessen these symptoms. . If you have diarrhea, eat low-fiber foods that are high in protein and calories and avoid foods that can irritate your digestive tracts or lead to cramping. . Ask your nurse or doctor about medicine that can lessen or stop your diarrhea. . Mouth care is very important. Your  mouth care should consist of routine, gentle cleaning of your teeth or dentures and rinsing your mouth with a mixture of 1/2 teaspoon of salt in 8 ounces of water or 1/2 teaspoon of baking soda in 8 ounces of water. This should be done at least after each meal and at bedtime. . If you have mouth sores, avoid mouthwash that has alcohol. Also avoid alcohol and smoking because they can bother your mouth and throat. Marland Kitchen Keeping your nails moisturized may help with brittleness. . To help with itching, moisturize your skin several times day. . Use sunscreen with SPF 30 or higher when you are outdoors even for a short time. Cover up when you are out in the sun. Wear wide-brimmed hats, long-sleeved shirts, and pants. Keep your neck, chest, and back covered. Wear dark sun glasses when in the sun or bright lights. . If you get a rash do not put anything on it unless your doctor or nurse says you may. Keep the area around the rash clean and dry. Ask your doctor for medicine if your rash bothers you. Marland Kitchen Keeping your pain  under control is important to your well-being. Please tell your doctor or nurse if you are experiencing pain.  Food and Drug Interactions . There are no known interactions of fluorouracil with food. . Check with your doctor or pharmacist about all other prescription medicines and over-the-counter medicines and dietary supplements (vitamins, minerals, herbs and others) you are taking before starting this medicine as there are known drug interactions with 5-fluoroucacil. Also, check with your doctor or pharmacist before starting any new prescription or over-the-counter medicines, or dietary supplements to make sure that there are no interactions.  When to Call the Doctor Call your doctor or nurse if you have any of these symptoms and/or any new or unusual symptoms: . Fever of 100.4 F (38 C) or higher . Chills . Easy bleeding or bruising . Nose bleed that doesn't stop bleeding after  10-15 minutes . Trouble breathing . Feeling dizzy or lightheaded . Feeling that your heart is beating in a fast or not normal way (palpitations) . Chest pain or symptoms of a heart attack. Most heart attacks involve pain in the center of the chest that lasts more than a few minutes. The pain may go away and come back or it can be constant. It can feel like pressure, squeezing, fullness, or pain. Sometimes pain is felt in one or both arms, the back, neck, jaw, or stomach. If any of these symptoms last 2 minutes, call 911. Marland Kitchen Confusion and/or agitation . Hallucinations . Trouble understanding or speaking . Loss of control of bowels or bladder . Blurry vision or changes in your eyesight . Headache that does not go away . Numbness or lack of strength to your arms, legs, face, or body . Nausea that stops you from eating or drinking and/or is not relieved by prescribed medicines . Throwing up more than 3 times a day . Diarrhea, 4 times in one day or diarrhea with lack of strength or a feeling of being dizzy . Pain in your mouth or throat that makes it hard to eat or drink . Pain along the digestive tract - especially if worse after eating . Blood in your vomit (bright red or coffee-ground) and/or stools (bright red, or black/tarry) . Coughing up blood . Tiredness that interferes with your daily activities . Painful, red, or swollen areas on your hands or feet or around your nails . A new rash or a rash that is not relieved by prescribed medicines . Develop sensitivity to sunlight/light . Numbness and/or tingling of your hands and/or feet . Signs of allergic reaction: swelling of the face, feeling like your tongue or throat are swelling, trouble breathing, rash, itching, fever, chills, feeling dizzy, and/or feeling that your heart is beating in a fast or not normal way. If this happens, call 911 for emergency care. . If you think you are pregnant or may have impregnated your  partner  Reproduction Warnings . Pregnancy warning: This drug may have harmful effects on the unborn baby. Women of child bearing potential should use effective methods of birth control during your cancer treatment and 3 months after treatment. Men with male partners of childbearing potential should use effective methods of birth control during your cancer treatment and for 3 months after your cancer treatment. Let your doctor know right away if you think you may be pregnant or may have impregnated your partner. . Breastfeeding warning: It is not known if this drug passes into breast milk. For this reason, Women should not breastfeed during treatment because  this drug could enter the breast milk and cause harm to a breastfeeding baby. . Fertility warning: In men and women both, this drug may affect your ability to have children in the future. Talk with your doctor or nurse if you plan to have children. Ask for information on sperm or egg banking.  Bevacizumab (Avastin)  About This Drug Bevacizumab is used to treat cancer. It is given in the vein (IV).  Possible Side Effects . Teary eyes . Bleeding in your rectum . Runny/stuffy nose . Nosebleed . Changes in the way food and drinks taste . Electrolyte changes . Headache . Back pain . Changes to your kidneys, which could cause protein in your urine. . Dry skin . A red skin rash which can be peeling or scaling . High blood pressure Note: Each of the side effects above was reported in 10% or greater of patients treated with bevacizumab. Not all possible side effects are included above.  Warnings and Precautions . Perforation or fistula- an abnormal hole in your stomach, intestine, esophagus, or other organ, which can be life-threatening . Slow wound healing, which can be life-threatening . Abnormal bleeding which can be life-threatening - symptoms may be coughing up blood, throwing up blood (may look like coffee grounds), red  or black tarry bowel movements, abnormally heavy menstrual flow, nosebleeds or any other unusual bleeding. . Blood clots and events such as stroke and heart attack. A blood clot in your leg may cause your leg to swell, appear red and warm, and/or cause pain. A blood clot in your lungs may cause trouble breathing, pain when breathing, and/or chest pain. . Severe high blood pressure . Changes in your central nervous system can happen. The central nervous system is made up of your brain and spinal cord. You could feel extreme tiredness, agitation, confusion, hallucinations (see or hear things that are not there), trouble understanding or speaking, loss of control of your bowels or bladder, eyesight changes, numbness or lack of strength to your arms, legs, face, or body, and coma. If you start to have any of these symptoms let your doctor know right away. . Changes to your kidneys, which could cause protein in your urine and kidney failure, which can be life-threatening . While you are getting this drug in your vein (IV), you may have a reaction to the drug. Sometimes you may be given medication to stop or lessen these side effects. Your nurse will check you closely for these signs: fever or shaking chills, flushing, facial swelling, feeling dizzy, headache, trouble breathing, rash, itching, chest tightness, or chest pain. These reactions may happen after your infusion. If this happens, call 911 for emergency care. . In women, changes in your ovaries may happen that may cause menstrual bleeding to become irregular or stop, and may impair fertility . Congestive heart failure - your heart has less ability to pump blood properly. Note: Some of the side effects above are very rare. If you have concerns and/or questions, please discuss them with your medical team.  Important Information . Bevacizumab may cause slow wound healing. It should not be given for at least 28 days before surgery, and for at  least 28 days after surgery and until wound is fully healed. If you must have emergency surgery or have an accident that results in a wound, tell the doctor that you are on bevacizumab. . This drug may be present in the saliva, tears, sweat, urine, stool, vomit, semen, and vaginal secretions. Talk to  your doctor and/or your nurse about the necessary precautions to take during this time.  Treating Side Effects . Keeping your pain under control is important to your well-being. Please tell your doctor or nurse if you are experiencing pain. . Taking good care of your mouth may help food taste better and improve your appetite. . If you have a nose bleed, sit with your head tipped slightly forward. Apply pressure by lightly pinching the bridge of your nose between your thumb and forefinger. Call your doctor if you feel dizzy or faint or if the bleeding doesn't stop after 10 to 15 minutes. . To help with dry skin, moisturize your skin several times day. . Avoid sun exposure and apply sunscreen routinely when outdoors. . If you get a rash do not put anything on it unless your doctor or nurse says you may. Keep the area around the rash clean and dry. Ask your doctor for medicine if your rash bothers you. . Infusion reactions may happen after your infusion. If this happens, call 911 for emergency care.  Food and Drug Interactions . There are no known interactions of bevacizumab with food. . This drug may interact with other medicines. Tell your doctor and pharmacist about all the medicines and dietary supplements (vitamins, minerals, herbs and others) that you are taking at this time. Also, check with your doctor or pharmacist before starting any new prescription or over-thecounter medicines, or dietary supplements to make sure that there are no interactions.  When to Call the Doctor Call your doctor or nurse if you have any of these symptoms and/or any new or unusual symptoms: . Fever of 100.4 F  (38 C) or higher . Chills . Confusion and/or agitation . Hallucinations . Trouble understanding or speaking . Headache that does not go away . Nose bleed that doesn't stop bleeding after 10 -15 minutes . Feeling dizzy or lightheaded . Blurry vision or changes in your eyesight . Difficulty swallowing . Easy bleeding or bruising . Blood in your urine, vomit (bright red or coffee-ground) and/or stools ( bright red, or black/tarry) . Coughing up blood . Wheezing and/or trouble breathing . Chest pain or symptoms of a heart attack. Most heart attacks involve pain in the center of the chest that lasts more than a few minutes. The pain may go away and come back. It can feel like pressure, squeezing, fullness, or pain. Sometimes pain is felt in one or both arms, the back, neck, jaw, or stomach. If any of these symptoms last 2 minutes, call 911. Marland Kitchen Symptoms of a stroke such as sudden numbness or weakness of your face, arm, or leg, mostly on one side of your body; sudden confusion, trouble speaking or understanding; sudden trouble seeing in one or both eyes; sudden trouble walking, feeling dizzy, loss of balance or coordination; or sudden, bad headache with no known cause. If you have any of these symptoms for 2 minutes, call 911. . Numbness or lack of strength to your arms, legs, face, or body . Nausea that stops you from eating or drinking or relieved by prescribed medicine . Throwing up more than 3 times a day . Pain in your abdomen that does not go away . Foamy or bubbly-looking urine . Signs of infusion reaction: fever or shaking chills, flushing, facial swelling, feeling dizzy, headache, trouble breathing, rash, itching, chest tightness, or chest pain. . Pain that does not go away or is not relieved by prescribed medicine . Your leg or arm is swollen,  red, warm and/or painful . Swelling of arms, hands, legs and/or feet . Weight gain of 5 pounds in one week (fluid retention) . If you  think you may be pregnant  Reproduction Warnings . Pregnancy warning: This drug can have harmful effects on the unborn baby. Women of child bearing potential should use effective methods of birth control during your cancer treatment and for 6 months after treatment. In women, changes in your ovaries may happen that may cause menstrual bleeding to become irregular or stop, do not assume you cannot get pregnant. Let your doctor know right away if you think you may be pregnant . Breastfeeding warning: Women should not breastfeed during treatment and for 6 months after treatment because this drug could enter the breast milk and cause harm to a breastfeeding baby. . Fertility warning: In women, this drug may affect your ability to have children in the future. Talk with your doctor or nurse if you plan to have children. Ask for information on egg banking.  Irinotecan (Camptosar)  About This Drug Irinotecan is used to treat cancer. It is given in the vein (IV).  Possible Side Effects . Bone marrow depression. This is a decrease in the number of white blood cells, red blood cells, and platelets. This may raise your risk of infection, make you tired and weak (fatigue), and raise your risk of bleeding. . Weakness . Fever . Infection . Hair loss. Hair loss is often temporary, although with certain medicine, hair loss can sometimes be permanent. Hair loss may happen suddenly or gradually. If you lose hair, you may lose it from your head, face, armpits, pubic area, chest, and/or legs. You may also notice your hair getting thin. . Soreness of the mouth and throat. You may have red areas, white patches, or sores that hurt . Nausea and throwing up (vomiting) . Pain in your abdomen . Loose bowel movements (diarrhea) . Constipation (unable to move bowels) . Decreased appetite (decreased hunger) . Weight loss . Changes in your liver function . Pain Note: Each of the side effects above was reported  in 30% or greater of patients treated with irinotecan. Not all possible side effects are included above.  Warnings and Precautions . Severe diarrhea and colitis which is swelling (inflammation) in the colon - symptoms are loose bowel movements (diarrhea) stomach cramping, and sometimes blood in the bowel movements. Very rarely, a hole in your stomach, small and/or large intestine can happen. . Severe bone marrow depression which can be life-threatening . Allergic reactions, including anaphylaxis are rare but may happen in some patients. Signs of allergic reaction to this drug may be swelling of the face, feeling like your tongue or throat are swelling, trouble breathing, rash, itching, fever, chills, feeling dizzy, and/or feeling that your heart is beating in a fast or not normal way. If this happens, do not take another dose of this drug. You should get urgent medical treatment. . Changes in your kidney function which can cause kidney failure and be life-threatening . Inflammation (swelling) of the lungs, which can be life-threatening. You may have a dry cough or trouble breathing. Marland Kitchen Blurred vision or other changes in eyesight as well as feeling dizzy can happen within 24 hours of receiving this medicine. Note: Some of the side effects above are very rare. If you have concerns and/or questions, please discuss them with your medical team.  Important Information . This drug may be present in the saliva, tears, sweat, urine, stool, vomit, semen, and  vaginal secretions. Talk to your doctor and/or your nurse about the necessary precautions to take during this time. . It is important that you notify your doctor and/or nurse at the first sign of loose bowel movements (diarrhea) so they can provide you with anti-diarrhea medication and give you further instructions. Notify your doctor and/or nurse if you are taking anti-diarrhea medication and your symptoms have not improved, or are  worsening.  Treating Side Effects . Manage tiredness by pacing your activities for the day. . Be sure to include periods of rest between energy-draining activities. . To decrease infection, wash your hands regularly . Avoid close contact with people who have a cold, the flu, or other infections. . Take your temperature as your doctor or nurse tells you, and whenever you feel like you may have a fever . To help decrease bleeding, use a soft toothbrush. Check with your nurse before using dental floss. . Be very careful when using knives or tools . Use an electric shaver instead of a razor . To help with hair loss, wash with a mild shampoo and avoid washing your hair everyday . Avoid rubbing your scalp, pat your hair or scalp dry . Avoid coloring your hair . Limit your use of hair spray, electric curlers, blow dryers, and curling irons. . If you are interested in getting a wig, talk to your nurse. You can also call the [place] at 800-ACS-2345 to find out information about the "Look Good, Feel Better" program close to where you live. It is a free program where women getting chemotherapy can learn about wigs, turbans and scarves as well as makeup techniques and skin and nail care. . Mouth care is very important. Your mouth care should consist of routine, gentle cleaning of your teeth or dentures and rinsing your mouth with a mixture of 1/2 teaspoon of salt in 8 ounces of water or  teaspoon of baking soda in 8 ounces of water. This should be done at least after each meal and at bedtime. . If you have mouth sores, avoid mouthwash that has alcohol. Also avoid alcohol and smoking because they can bother your mouth and throat. . Ask your doctor or nurse about medicines that are available to help stop or lessen constipation. . If you are not able to move your bowels, check with your doctor or nurse before you use enemas, laxatives, or suppositories . Drink plenty of fluids (a  minimum of eight glasses per day is recommended). . If you throw up or have loose bowel movements, you should drink more fluids so that you do not become dehydrated (lack water in the body from losing too much fluid). . If you get diarrhea, eat low-fiber foods that are high in protein and calories and avoid foods that can irritate your digestive tracts or lead to cramping. . Ask your nurse or doctor about medicine that can lessen or stop your diarrhea. . To help with nausea and vomiting, eat small, frequent meals instead of three large meals a day. Choose foods and drinks that are at room temperature. Ask your nurse or doctor about other helpful tips and medicine that is available to help or stop lessen these symptoms. . To help with decreased appetite, eat small, frequent meals . Eat high caloric food such as pudding, ice cream, yogurt and milkshakes. Marland Kitchen Keeping your pain under control is important to your well-being. Please tell your doctor or nurse if you are experiencing pain.  Food and Drug Interactions .  This drug may interact with grapefruit and grapefruit juice. Talk to your doctor as this could make side effects worse. . This drug may interact with other medicines. Tell your doctor and pharmacist about all the prescription and over-the-counter medicines and dietary supplements (vitamins, minerals, herbs and others) that you are taking at this time. The safety and use of dietary supplements and alternative diets are often not known. Using these might affect your cancer or interfere with your treatment. Until more is known, you should not use dietary supplements or alternative diets without your cancer doctor's help. Marland Kitchen Avoid the use of St. John's Wort while taking irinotecan as this may lower the levels of the drug in your body, which can make it less effective.  When to Call the Doctor Call your doctor or nurse if you have any of these symptoms and/or any new or unusual symptoms: .  Fever of 100.4 F (38 C) or higher . Chills . Pain in your chest . Dry cough . Trouble breathing . Feeling dizzy or lightheaded . Easy bleeding or bruising . Pain in your mouth or throat that makes it hard to eat or drink . No bowel movement in 3 days or when you feel uncomfortable . Loose bowel movements (diarrhea) 4 times a day or loose bowel movements with lack of strength or a feeling of being dizzy . Blood in your stool . Severe Abdominal pain that does not go away . Nausea that stops you from eating or drinking and/or is not relieved by prescribed medicines . Throwing up more than 3 times a day . Lasting loss of appetite or rapid weight loss of five pounds in a week . Fatigue that interferes with your daily activities . Decreased urine . Signs of allergic reaction: swelling of the face, feeling like your tongue or throat are swelling, trouble breathing, rash, itching, fever, chills, feeling dizzy, and/or feeling that your heart is beating in a fast or not normal way . If you think you may be pregnant  Reproduction Warnings . Pregnancy warning: This drug can have harmful effects on the unborn baby. Women of child bearing potential should use effective methods of birth control during your cancer treatment. Let your doctor know right away if you think you may be pregnant. . Breastfeeding warning: It is not known if this drug passes into breast milk. For this reason, women should talk to their doctor about the risks and benefits of breast feeding during treatment with this drug because this drug may enter the breast milk and cause harm to a breast feeding baby. . Fertility warning: Human fertility studies have not been done with this drug. Talk with your doctor or nurse if you plan to have children. Ask for information on sperm or egg banking.  Leucovorin Calcium  About This Drug Leucovorin is a vitamin. It is used in combination with other cancer fighting drugs such as  5-fluorouracil and methotrexate. Leucovorin is given in the vein (IV) or by mouth (orally).  Possible Side Effects . Rash and itching Note: Leucovorin by itself has very few side effects. Other side effects you may have can be caused by the other drugs you are taking, such as 5-fluorouracil or methotrexate.  Warnings and Precautions . Allergic reactions, including anaphylaxis are rare but may happen in some patients. Signs of allergic reaction to this drug may be swelling of the face, feeling like your tongue or throat are swelling, trouble breathing, rash, itching, fever, chills, feeling dizzy, and/or feeling  that your heart is beating in a fast or not normal way. If this happens, do not take another dose of this drug. You should get urgent medical treatment.  How to Take Your Medication . Swallow the medicine with or without food. . Missed dose: If you vomit or miss a dose, contact your physician immediately for further instructions. . Storage: Store this medicine in the original container at room temperature. Protect from light. Discuss with your nurse or your doctor how to dispose of unused medicine.  Treating Side Effects . If you get a rash do not put anything on it unless your doctor or nurse says you may. Keep the area around the rash clean and dry. Ask your doctor for medicine if your rash bothers you.  Food and Drug Interactions . There are no known interactions of leucovorin with food. . This drug may interact with other medicines. Tell your doctor and pharmacist about all the prescription and over-the-counter medicines and dietary supplements (vitamins, minerals, herbs and others) that you are taking at this time. The safety and use of dietary supplements and alternative diets are often not known. Using these might affect your cancer or interfere with your treatment. Until more is known, you should not use dietary supplements or alternative diets without your cancer  doctor's help.  When to Call the Doctor Call your doctor or nurse if you have any of these symptoms and/or any new or unusual symptoms: . A new rash or a rash that is not relieved by prescribed medicines . Signs of allergic reaction: swelling of the face, feeling like your tongue or throat are swelling, trouble breathing, rash, itching, fever, chills, feeling dizzy, and/or feeling that your heart is beating in a fast or not normal way . If you think you may be pregnant  Reproduction Warnings . Pregnancy warning: It is not known if this drug may harm an unborn child. For this reason, be sure to talk with your doctor if you are pregnant or planning to become pregnant while receiving this drug. Let your doctor know right away if you think you may be pregnant . Breastfeeding warning: It is not known if this drug passes into breast milk. For this reason, women should talk to their doctor about the risks and benefits of breastfeeding during treatment with this drug because this drug may enter the breast milk and cause harm to a breastfeeding baby. . Fertility warning: Human fertility studies have not been done with this drug. Talk with your doctor or nurse if you plan to have children. Ask for information on sperm or egg banking.  SELF CARE ACTIVITIES WHILE ON CHEMOTHERAPY:  Hydration Increase your fluid intake 48 hours prior to treatment and drink at least 8 to 12 cups (64 ounces) of water/decaffeinated beverages per day after treatment. You can still have your cup of coffee or soda but these beverages do not count as part of your 8 to 12 cups that you need to drink daily. No alcohol intake.  Medications Continue taking your normal prescription medication as prescribed.  If you start any new herbal or new supplements please let us know first to make sure it is safe.  Mouth Care Have teeth cleaned professionally before starting treatment. Keep dentures and partial plates clean. Use soft  toothbrush and do not use mouthwashes that contain alcohol. Biotene is a good mouthwash that is available at most pharmacies or may be ordered by calling [phone]. Use warm salt water gargles (1 teaspoon  salt per 1 quart warm water) before and after meals and at bedtime. Or you may rinse with 2 tablespoons of three-percent hydrogen peroxide mixed in eight ounces of water. If you are still having problems with your mouth or sores in your mouth please call the clinic. If you need dental work, please let the doctor know before you go for your appointment so that we can coordinate the best possible time for you in regards to your chemo regimen. You need to also let your dentist know that you are actively taking chemo. We may need to do labs prior to your dental appointment.  Skin Care Always use sunscreen that has not expired and with SPF (Sun Protection Factor) of 50 or higher. Wear hats to protect your head from the sun. Remember to use sunscreen on your hands, ears, face, & feet.  Use good moisturizing lotions such as udder cream, eucerin, or even Vaseline. Some chemotherapies can cause dry skin, color changes in your skin and nails.    . Avoid long, hot showers or baths. . Use gentle, fragrance-free soaps and laundry detergent. . Use moisturizers, preferably creams or ointments rather than lotions because the thicker consistency is better at preventing skin dehydration. Apply the cream or ointment within 15 minutes of showering. Reapply moisturizer at night, and moisturize your hands every time after you wash them.  Hair Loss (if your doctor says your hair will fall out)  . If your doctor says that your hair is likely to fall out, decide before you begin chemo whether you want to wear a wig. You may want to shop before treatment to match your hair color. . Hats, turbans, and scarves can also camouflage hair loss, although some people prefer to leave their heads uncovered. If you go bare-headed  outdoors, be sure to use sunscreen on your scalp. . Cut your hair short. It eases the inconvenience of shedding lots of hair, but it also can reduce the emotional impact of watching your hair fall out. . Don't perm or color your hair during chemotherapy. Those chemical treatments are already damaging to hair and can enhance hair loss. Once your chemo treatments are done and your hair has grown back, it's OK to resume dyeing or perming hair. With chemotherapy, hair loss is almost always temporary. But when it grows back, it may be a different color or texture. In older adults who still had hair color before chemotherapy, the new growth may be completely gray.  Often, new hair is very fine and soft.  Infection Prevention Please wash your hands for at least 30 seconds using warm soapy water. Handwashing is the #1 way to prevent the spread of germs. Stay away from sick people or people who are getting over a cold. If you develop respiratory systems such as green/yellow mucus production or productive cough or persistent cough let us know and we will see if you need an antibiotic. It is a good idea to keep a pair of gloves on when going into grocery stores/[employer] to decrease your risk of coming into contact with germs on the carts, etc. Carry alcohol hand gel with you at all times and use it frequently if out in public. If your temperature reaches 100.5 or higher please call the clinic and let us know.  If it is after hours or on the weekend please go to the ER if your temperature is over 100.5.  Please have your own personal thermometer at home to use.  Sex and bodily fluids If you are going to have sex, a condom must be used to protect the person that isn't taking chemotherapy. Chemo can decrease your libido (sex drive). For a few days after chemotherapy, chemotherapy can be excreted through your bodily fluids.  When using the toilet please close the lid and flush the toilet twice.  Do this for a few day  after you have had chemotherapy.   Effects of chemotherapy on your sex life Some changes are simple and won't last long. They won't affect your sex life permanently. Sometimes you may feel: . too tired . not strong enough to be very active . sick or sore  . not in the mood . anxious or low Your anxiety might not seem related to sex. For example, you may be worried about the cancer and how your treatment is going. Or you may be worried about money, or about how you family are coping with your illness. These things can cause stress, which can affect your interest in sex. It's important to talk to your partner about how you feel. Remember - the changes to your sex life don't usually last long. There's usually no medical reason to stop having sex during chemo. The drugs won't have any long term physical effects on your performance or enjoyment of sex. Cancer can't be passed on to your partner during sex  Contraception It's important to use reliable contraception during treatment. Avoid getting pregnant while you or your partner are having chemotherapy. This is because the drugs may harm the baby. Sometimes chemotherapy drugs can leave a man or woman infertile.  This means you would not be able to have children in the future. You might want to talk to someone about permanent infertility. It can be very difficult to learn that you may no longer be able to have children. Some people find counselling helpful. There might be ways to preserve your fertility, although this is easier for men than for women. You may want to speak to a fertility expert. You can talk about sperm banking or harvesting your eggs. You can also ask about other fertility options, such as donor eggs. If you have or have had breast cancer, your doctor might advise you not to take the contraceptive pill. This is because the hormones in it might affect the cancer.  It is not known for sure whether or not chemotherapy drugs can be passed  on through semen or secretions from the vagina. Because of this some doctors advise people to use a barrier method if you have sex during treatment. This applies to vaginal, anal or oral sex. Generally, doctors advise a barrier method only for the time you are actually having the treatment and for about a week after your treatment. Advice like this can be worrying, but this does not mean that you have to avoid being intimate with your partner. You can still have close contact with your partner and continue to enjoy sex.  Animals If you have cats or birds we just ask that you not change the litter or change the cage.  Please have someone else do this for you while you are on chemotherapy.   Food Safety During and After Cancer Treatment Food safety is important for people both during and after cancer treatment. Cancer and cancer treatments, such as chemotherapy, radiation therapy, and stem cell/bone marrow transplantation, often weaken the immune system. This makes it harder for your body to protect itself from foodborne illness, also called  food poisoning. Foodborne illness is caused by eating food that contains harmful bacteria, parasites, or viruses.  Foods to avoid Some foods have a higher risk of becoming tainted with bacteria. These include: Marland Kitchen Unwashed fresh fruit and vegetables, especially leafy vegetables that can hide dirt and other contaminants . Raw sprouts, such as alfalfa sprouts . Raw or undercooked beef, especially ground beef, or other raw or undercooked meat and poultry . Fatty, fried, or spicy foods immediately before or after treatment.  These can sit heavy on your stomach and make you feel nauseous. . Raw or undercooked shellfish, such as oysters. . Sushi and sashimi, which often contain raw fish.  . Unpasteurized beverages, such as unpasteurized fruit juices, raw milk, raw yogurt, or cider . Undercooked eggs, such as soft boiled, over easy, and poached; raw, unpasteurized eggs;  or foods made with raw egg, such as homemade raw cookie dough and homemade mayonnaise Simple steps for food safety Shop smart. . Do not buy food stored or displayed in an unclean area. . Do not buy bruised or damaged fruits or vegetables. . Do not buy cans that have cracks, dents, or bulges. . Pick up foods that can spoil at the end of your shopping trip and store them in a cooler on the way home. Prepare and clean up foods carefully. . Rinse all fresh fruits and vegetables under running water, and dry them with a clean towel or paper towel. . Clean the top of cans before opening them. . After preparing food, wash your hands for 20 seconds with hot water and soap. Pay special attention to areas between fingers and under nails. . Clean your utensils and dishes with hot water and soap. Marland Kitchen Disinfect your kitchen and cutting boards using 1 teaspoon of liquid, unscented bleach mixed into 1 quart of water.   Dispose of old food. . Eat canned and packaged food before its expiration date (the "use by" or "best before" date). . Consume refrigerated leftovers within 3 to 4 days. After that time, throw out the food. Even if the food does not smell or look spoiled, it still may be unsafe. Some bacteria, such as Listeria, can grow even on foods stored in the refrigerator if they are kept for too long. Take precautions when eating out. . At restaurants, avoid buffets and salad bars where food sits out for a long time and comes in contact with many people. Food can become contaminated when someone with a virus, often a norovirus, or another "bug" handles it. . Put any leftover food in a "to-go" container yourself, rather than having the server do it. And, refrigerate leftovers as soon as you get home. . Choose restaurants that are clean and that are willing to prepare your food as you order it cooked.   MEDICATIONS:  Dexamethasone 4 mg tablet - Take 2 tablets daily for 2 days after chemotherapy.    Compazine/Prochlorperazine 10mg  tablet. Take 1 tablet every 6 hours as needed for nausea/vomiting. (This can make you sleepy)  Zofran 8 mg - take 1 tablet twice daily for nausea/vomiting starting the 3rd day after chemotherapy. (this can cause constipation)  EMLA cream. Apply a quarter size amount to port site 1 hour prior to chemo. Do not rub in. Cover with plastic wrap.   Over-the-Counter Meds:  Imodium 2mg  capsule. Take 2 capsules after the 1st loose stool and then 1 capsule every 2 hours until you go a total of 12 hours without having a loose stool. Call  the [facility] if loose stools continue. If diarrhea occurs at bedtime, take 2 capsules at bedtime. Then take 2 capsules every 4 hours until morning. Call [facility].   Colace - 100 mg capsules - take 2 capsules daily.  If this doesn't help then you can increase to 2 capsules twice daily. Call us if this does not help your bowels move.     Diarrhea Sheet   If you are having loose stools/diarrhea, please purchase Imodium and begin taking as outlined:  At the first sign of poorly formed or loose stools you should begin taking Imodium (loperamide) 2 mg capsules.  Take two caplets (4mg ) followed by one caplet (2mg ) every 2 hours until you have had no diarrhea for 12 hours.  During the night take two caplets (4mg ) at bedtime and continue every 4 hours during the night until the morning.  Stop taking Imodium only after there is no sign of diarrhea for 12 hours.    Always call the [facility] if you are having loose stools/diarrhea that you can't get under control.  Loose stools/diarrhea leads to dehydration (loss of water) in your body.  We have other options of trying to get the loose stools/diarrhea to stop but you must let us know!   Constipation Sheet  Colace - 100 mg capsules - take 2 capsules daily.  If this doesn't help then you can increase to 2 capsules twice daily.  Please call if the above does not work for you.    Do not go more than 2 days without a bowel movement.  It is very important that you do not become constipated.  It will make you feel sick to your stomach (nausea) and can cause abdominal pain and vomiting.   Nausea Sheet  Zofran 8 mg tablet - Take 1 tablet twice daily for nausea/vomiting starting the 3rd day after chemotherapy.( this can cause you to be constipated).  Compazine/Prochlorperazine 10mg  tablet. Take 1 tablet every 6 hours as needed for nausea/vomiting. (This can make you sleepy)  If you are having persistent nausea (nausea that does not stop) please call the [facility] and let us know the amount of nausea that you are experiencing.  If you begin to vomit, you need to call the [facility] and if it is the weekend and you have vomited more than one time and can't get it to stop-go to the Emergency Room.  Persistent nausea/vomiting can lead to dehydration (loss of fluid in your body) and will make you feel terrible.   Ice chips, sips of clear liquids, foods that are @ room temperature, crackers, and toast tend to be better tolerated.   SYMPTOMS TO REPORT AS SOON AS POSSIBLE AFTER TREATMENT:   FEVER GREATER THAN 100.5 F  CHILLS WITH OR WITHOUT FEVER  NAUSEA AND VOMITING THAT IS NOT CONTROLLED WITH YOUR NAUSEA MEDICATION  UNUSUAL SHORTNESS OF BREATH  UNUSUAL BRUISING OR BLEEDING  TENDERNESS IN MOUTH AND THROAT WITH OR WITHOUT PRESENCE OF ULCERS  URINARY PROBLEMS  BOWEL PROBLEMS  UNUSUAL RASH      Wear comfortable clothing and clothing appropriate for easy access to any Portacath or PICC line. Let us know if there is anything that we can do to make your therapy better!    What to do if you need assistance after hours or on the weekends: CALL [phone].  HOLD on the line, do not hang up.  You will hear multiple messages but at the end you will be connected  with a nurse triage line.  They will contact the doctor if necessary.  Most of the time they will be  able to assist you.  Do not call the hospital operator.      I have been informed and understand all of the instructions given to me and have received a copy. I have been instructed to call the clinic [phone] or my family physician as soon as possible for continued medical care, if indicated. I do not have any more questions at this time but understand that I may call the [facility] or the Patient Navigator at [phone] during office hours should I have questions or need assistance in obtaining follow-up care.

## 2017-12-27 NOTE — H&P (Signed)
Erik [name]; [id number]; [birth year]   HPI Patient is a 62 year old white male who was referred back to my care by Dr. Delton Coombes for exploratory laparotomy.  Recent work-up for his known colon cancer shows carcinomatosis.  Recent exploratory laparotomy reveals carcinomatosis.  The patient is about to undergo chemotherapy for recurrent colon cancer. Past Medical History:  Diagnosis Date  . Abnormal stress echocardiogram   . Adenocarcinoma of transverse colon ([place]) 02/10/2015  . Alcohol abuse    Heavy Use up until 2010  . Anxiety   . Blood transfusion without reported diagnosis   . COPD (chronic obstructive pulmonary disease) ([place])   . Depression   . Emphysema of lung ([place])   . GERD (gastroesophageal reflux disease)   . Head trauma 2001   closed head injury; coma for 4 weeks  . Hypercholesterolemia   . Hypertension   . ING HERN W/GANGREN RECUR UNILAT/UNSPEC ING HERN 04/21/2009   Qualifier: Diagnosis of  By: Verl Blalock, MD, Delanna Ahmadi PTSD (post-traumatic stress disorder)   . Pulmonary nodule 06/14/2016  . SAH (subarachnoid hemorrhage) ([place]) 08/31/2012  . SDH (subdural hematoma) ([place]) 08/31/2012    Past Surgical History:  Procedure Laterality Date  . BIOPSY  01/25/2016   Procedure: BIOPSY;  Surgeon: Danie Binder, MD;  [facility];  Service: Endoscopy;;  ileum;   . cardiac cath    . COLONOSCOPY  2011   Dr. Oneida Alar: multiple adenomas and hyperplastic polyps  . COLONOSCOPY WITH PROPOFOL N/A 01/12/2015   Procedure: COLONOSCOPY WITH PROPOFOL;  Surgeon: Danie Binder, MD;  [facility]: [facility];  Service: Endoscopy;  Laterality: N/A;  1030  . COLONOSCOPY WITH PROPOFOL N/A 01/25/2016   Procedure: COLONOSCOPY WITH PROPOFOL;  Surgeon: Danie Binder, MD;  [facility];  Service: Endoscopy;  Laterality: N/A;  1230  . CRANIOTOMY  2001  . ESOPHAGOGASTRODUODENOSCOPY (EGD) WITH PROPOFOL N/A 01/12/2015   Procedure: ESOPHAGOGASTRODUODENOSCOPY (EGD) WITH PROPOFOL;   Surgeon: Danie Binder, MD;  [facility];  Service: Endoscopy;  Laterality: N/A;  . head injury surgery    . HERNIA REPAIR Right 2012   Inguinal- Forestine Na  . KIDNEY SURGERY     >30 years ago  . LAPAROSCOPIC RIGHT HEMI COLECTOMY Left 02/10/2015   Procedure: LAPAROSCOPIC THEN OPEN RIGHT HEMI COLECTOMY;  Surgeon: [name], MD;  Location: [facility];  Service: General;  Laterality: Left;  . POLYPECTOMY  01/25/2016   Procedure: POLYPECTOMY;  Surgeon: Danie Binder, MD;  [facility];  Service: Endoscopy;;  colon  . PORT-A-CATH REMOVAL N/A 02/26/2017   Procedure: MINOR REMOVAL PORT-A-CATH;  Surgeon: Aviva Signs, MD;  Location: [facility];  Service: General;  Laterality: N/A;  Pt to arrive at 8:00am  . PORTACATH PLACEMENT N/A 03/24/2015   Procedure: INSERTION PORT-A-CATH;  Surgeon: Jules Husbands, MD;  Location: [facility];  Service: General;  Laterality: N/A;    Family History  Problem Relation Age of Onset  . Pulmonary embolism Mother   . Breast cancer Mother   . Cancer Mother        breast cancer  . Arthritis Mother   . Heart disease Father   . Cancer Father 66       Leukemia  . Cancer Paternal Grandfather        Lung  . Ataxia Neg Hx   . Chorea Neg Hx   . Dementia Neg Hx   . Mental retardation Neg Hx   .  Migraines Neg Hx   . Multiple sclerosis Neg Hx   . Neurofibromatosis Neg Hx   . Neuropathy Neg Hx   . Parkinsonism Neg Hx   . Seizures Neg Hx   . Stroke Neg Hx   . Colon cancer Neg Hx     Current Outpatient Medications on File Prior to Visit  Medication Sig Dispense Refill  . amitriptyline (ELAVIL) 25 MG tablet TAKE 1 TABLET BY MOUTH EVERY NIGHT AT BEDTIME (Patient taking differently: Take 25 mg by mouth at bedtime as needed. ) 30 tablet 0  . atorvastatin (LIPITOR) 20 MG tablet Take 1 tablet (20 mg total) by mouth at bedtime. (Patient taking differently: Take 20 mg by mouth every morning. ) 90 tablet 3  . buPROPion (WELLBUTRIN SR) 150 MG 12 hr tablet  Take 150 mg by mouth daily.     Marland Kitchen FLUoxetine (PROZAC) 20 MG capsule TAKE 1 CAPSULE BY MOUTH 2 TIMES A DAY (Patient taking differently: Take 60 mg by mouth every morning. ) 60 capsule 0  . gabapentin (NEURONTIN) 300 MG capsule Take 1-3 capsules (300-900 mg total) by mouth See admin instructions. 300mg  in the morning and 900mg  at bedtime 120 capsule 1  . loperamide (IMODIUM) 2 MG capsule TAKE 1 TO 2 CAPSULES BY MOUTH EVERY 6 HOURS AS NEEDED FOR DIARRHEA OR LOOSE STOOLS (Patient taking differently: Take 2-4 mg by mouth as needed. ) 60 capsule 0  . omeprazole (PRILOSEC) 20 MG capsule Take 20 mg by mouth daily.    Marland Kitchen PROAIR HFA 108 (90 Base) MCG/ACT inhaler INHALE 2 PUFFS BY MOUTH EVERY 6 HOURS AS NEEDED FOR SHORTNESS OF BREATH/WHEEZING. (Patient taking differently: Inhale 2 puffs into the lungs every 6 (six) hours as needed for wheezing or shortness of breath. ) 18 each 2   No current facility-administered medications on file prior to visit.     No Known Allergies  Social History   Substance and Sexual Activity  Alcohol Use Yes  . Alcohol/week: 0.0 standard drinks   Comment: beer occ, history of ETOH abuse in remote past.     Social History   Tobacco Use  Smoking Status Current Some Day Smoker  . Packs/day: 0.10  . Years: 30.00  . Pack years: 3.00  . Types: Cigarettes  . Start date: 02/06/1973  Smokeless Tobacco Never Used  Tobacco Comment   smokes every 2-3 days, 2-3 a day when smokes    Review of Systems  Constitutional: Positive for chills and fever.  Eyes: Positive for blurred vision.  Respiratory: Positive for shortness of breath.   Cardiovascular: Negative.   Gastrointestinal: Negative.   Genitourinary: Negative.   Musculoskeletal: Positive for joint pain.  Skin: Positive for rash.  Neurological: Positive for tremors and sensory change.  Endo/Heme/Allergies: Negative.   Psychiatric/Behavioral: Negative.     Objective   Vitals:   12/13/17 0956  BP: (!) 186/114   Pulse: (!) 116  Resp: 18  Temp: (!) 96.4 F (35.8 C)    Physical Exam  Constitutional: He is oriented to person, place, and time. He appears well-developed and well-nourished. No distress.  HENT:  Head: Normocephalic and atraumatic.  Cardiovascular: Normal rate, regular rhythm and normal heart sounds. Exam reveals no gallop and no friction rub.  No murmur heard. Pulmonary/Chest: Effort normal and breath sounds normal. No stridor. No respiratory distress. He has no wheezes. He has no rales.  Abdominal: Soft. Bowel sounds are normal. He exhibits no distension and no mass. There is no tenderness. There  is no rebound and no guarding. No hernia.  Neurological: He is alert and oriented to person, place, and time.  Skin: Skin is warm and dry.  Vitals reviewed. Oncology notes reviewed.  CT scan report reviewed.  Assessment  Colon carcinoma, carcinomatosis Plan   Patient is scheduled for Port-A-Cath insertion on 01/07/2018.  The risks and benefits of the procedure including bleeding, infection, and pneumothorax were fully explained to the patient, who gave informed consent.

## 2017-12-27 NOTE — Progress Notes (Signed)
Chemotherapy teaching pulled together. 

## 2017-12-28 ENCOUNTER — Other Ambulatory Visit (HOSPITAL_COMMUNITY): Payer: Self-pay

## 2017-12-28 DIAGNOSIS — C188 Malignant neoplasm of overlapping sites of colon: Secondary | ICD-10-CM

## 2017-12-28 DIAGNOSIS — C184 Malignant neoplasm of transverse colon: Secondary | ICD-10-CM

## 2017-12-28 DIAGNOSIS — C772 Secondary and unspecified malignant neoplasm of intra-abdominal lymph nodes: Principal | ICD-10-CM

## 2017-12-31 ENCOUNTER — Encounter (HOSPITAL_COMMUNITY): Payer: Self-pay

## 2018-01-01 ENCOUNTER — Ambulatory Visit (INDEPENDENT_AMBULATORY_CARE_PROVIDER_SITE_OTHER): Payer: Self-pay | Admitting: General Surgery

## 2018-01-01 ENCOUNTER — Encounter: Payer: Self-pay | Admitting: General Surgery

## 2018-01-01 ENCOUNTER — Encounter (HOSPITAL_COMMUNITY)
Admission: RE | Admit: 2018-01-01 | Discharge: 2018-01-01 | Disposition: A | Discharge status: Home / Self Care | Payer: Medicaid Other | Source: Ambulatory Visit | Attending: General Surgery | Admitting: General Surgery

## 2018-01-01 VITALS — BP 178/90 | HR 96 | Temp 96.0°F | Resp 18 | Wt 234.0 lb

## 2018-01-01 DIAGNOSIS — Z09 Encounter for follow-up examination after completed treatment for conditions other than malignant neoplasm: Secondary | ICD-10-CM

## 2018-01-01 NOTE — Patient Instructions (Signed)
Implanted Port Insertion  Implanted port insertion is a procedure to put in a port and catheter. The port is a device with an injectable disk that can be accessed by your health care provider. The port is connected to a vein in the chest or neck by a small flexible tube (catheter). There are different types of ports. The implanted port may be used as a long-term IV access for:  · Medicines, such as chemotherapy.  · Fluids.  · Liquid nutrition, such as total parenteral nutrition (TPN).  · Blood samples.    Having a port means that your health care provider will not need to use the veins in your arms for these procedures.  Tell a health care provider about:  · Any allergies you have.  · All medicines you are taking, especially blood thinners, as well as any vitamins, herbs, eye drops, creams, over-the-counter medicines, and steroids.  · Any problems you or family members have had with anesthetic medicines.  · Any blood disorders you have.  · Any surgeries you have had.  · Any medical conditions you have, including diabetes or kidney problems.  · Whether you are pregnant or may be pregnant.  What are the risks?  Generally, this is a safe procedure. However, problems may occur, including:  · Allergic reactions to medicines or dyes.  · Damage to other structures or organs.  · Infection.  · Damage to the blood vessel, bruising, or bleeding at the puncture site.  · Blood clot.  · Breakdown of the skin over the port.  · A collection of air in the chest that can cause one of the lungs to collapse (pneumothorax). This is rare.    What happens before the procedure?  Staying hydrated  Follow instructions from your health care provider about hydration, which may include:  · Up to 2 hours before the procedure - you may continue to drink clear liquids, such as water, clear fruit juice, black coffee, and plain tea.    Eating and drinking restrictions  · Follow instructions from your health care provider about eating and drinking,  which may include:  ? 8 hours before the procedure - stop eating heavy meals or foods such as meat, fried foods, or fatty foods.  ? 6 hours before the procedure - stop eating light meals or foods, such as toast or cereal.  ? 6 hours before the procedure - stop drinking milk or drinks that contain milk.  ? 2 hours before the procedure - stop drinking clear liquids.  Medicines  · Ask your health care provider about:  ? Changing or stopping your regular medicines. This is especially important if you are taking diabetes medicines or blood thinners.  ? Taking medicines such as aspirin and ibuprofen. These medicines can thin your blood. Do not take these medicines before your procedure if your health care provider instructs you not to.  · You may be given antibiotic medicine to help prevent infection.  General instructions  · Plan to have someone take you home from the hospital or clinic.  · If you will be going home right after the procedure, plan to have someone with you for 24 hours.  · You may have blood tests.  · You may be asked to shower with a germ-killing soap.  What happens during the procedure?  · To lower your risk of infection:  ? Your health care team will wash or sanitize their hands.  ? Your skin will be washed with   soap.  ? Hair may be removed from the surgical area.  · An IV tube will be inserted into one of your veins.  · You will be given one or more of the following:  ? A medicine to help you relax (sedative).  ? A medicine to numb the area (local anesthetic).  · Two small cuts (incisions) will be made to insert the port.  ? One incision will be made in your neck to get access to the vein where the catheter will lie.  ? The other incision will be made in the upper chest. This is where the port will lie.  · The procedure may be done using continuous X-ray (fluoroscopy) or other imaging tools for guidance.  · The port and catheter will be placed. There may be a small, raised area where the port  is.  · The port will be flushed with a salt solution (saline), and blood will be drawn to make sure that it is working correctly.  · The incisions will be closed.  · Bandages (dressings) may be placed over the incisions.  The procedure may vary among health care providers and hospitals.  What happens after the procedure?  · Your blood pressure, heart rate, breathing rate, and blood oxygen level will be monitored until the medicines you were given have worn off.  · Do not drive for 24 hours if you were given a sedative.  · You will be given a manufacturer's information card for the type of port that you have. Keep this with you.  · Your port will need to be flushed and checked as told by your health care provider, usually every few weeks.  · A chest X-ray will be done to:  ? Check the placement of the port.  ? Make sure there is no injury to your lung.  Summary  · Implanted port insertion is a procedure to put in a port and catheter.  · The implanted port is used as a long-term IV access.  · The port will need to be flushed and checked as told by your health care provider, usually every few weeks.  · Keep your manufacturer's information card with you at all times.  This information is not intended to replace advice given to you by your health care provider. Make sure you discuss any questions you have with your health care provider.  Document Released: 11/13/2012 Document Revised: 12/15/2015 Document Reviewed: 12/15/2015  Elsevier Interactive Patient Education © 2017 [employer]

## 2018-01-01 NOTE — Progress Notes (Signed)
Subjective:     Erik [name]  Patient has minimal incisional pain.  Is doing well status post exploratory laparotomy. Objective:    BP (!) 178/90 (BP Location: Left Arm, Patient Position: Sitting, Cuff Size: Large)   Pulse 96   Temp (!) 96 F (35.6 C) (Temporal)   Resp 18   Wt 234 lb (106.1 kg)   BMI 30.04 kg/m   General:  alert, cooperative and no distress  Abdomen soft, incision healing well.  Staples removed, Steri-Strips applied. Final pathology consistent with carcinomatosis.  Patient aware of diagnosis.     Assessment:    Doing well postoperatively.    Plan:   For Port-A-Cath insertion on 01/07/2018.  The risks and benefits of the procedure including bleeding, infection, and pneumothorax were fully explained to the patient, who gave informed consent.

## 2018-01-07 ENCOUNTER — Ambulatory Visit (HOSPITAL_COMMUNITY): Payer: Medicaid Other

## 2018-01-07 ENCOUNTER — Ambulatory Visit (HOSPITAL_COMMUNITY)
Admission: RE | Admit: 2018-01-07 | Discharge: 2018-01-07 | Disposition: A | Discharge status: Home / Self Care | Payer: Medicaid Other | Source: Ambulatory Visit | Attending: General Surgery | Admitting: General Surgery

## 2018-01-07 ENCOUNTER — Ambulatory Visit (HOSPITAL_COMMUNITY): Payer: Medicaid Other | Admitting: Anesthesiology

## 2018-01-07 ENCOUNTER — Encounter (HOSPITAL_COMMUNITY)
Admission: RE | Disposition: A | Discharge status: Home / Self Care | Payer: Self-pay | Source: Ambulatory Visit | Attending: General Surgery

## 2018-01-07 DIAGNOSIS — C189 Malignant neoplasm of colon, unspecified: Secondary | ICD-10-CM | POA: Diagnosis not present

## 2018-01-07 DIAGNOSIS — C188 Malignant neoplasm of overlapping sites of colon: Secondary | ICD-10-CM

## 2018-01-07 DIAGNOSIS — C800 Disseminated malignant neoplasm, unspecified: Secondary | ICD-10-CM | POA: Insufficient documentation

## 2018-01-07 DIAGNOSIS — R911 Solitary pulmonary nodule: Secondary | ICD-10-CM | POA: Diagnosis not present

## 2018-01-07 DIAGNOSIS — F4310 Post-traumatic stress disorder, unspecified: Secondary | ICD-10-CM | POA: Insufficient documentation

## 2018-01-07 DIAGNOSIS — J449 Chronic obstructive pulmonary disease, unspecified: Secondary | ICD-10-CM | POA: Insufficient documentation

## 2018-01-07 DIAGNOSIS — F329 Major depressive disorder, single episode, unspecified: Secondary | ICD-10-CM | POA: Insufficient documentation

## 2018-01-07 DIAGNOSIS — K219 Gastro-esophageal reflux disease without esophagitis: Secondary | ICD-10-CM | POA: Insufficient documentation

## 2018-01-07 DIAGNOSIS — F17210 Nicotine dependence, cigarettes, uncomplicated: Secondary | ICD-10-CM | POA: Diagnosis not present

## 2018-01-07 DIAGNOSIS — I10 Essential (primary) hypertension: Secondary | ICD-10-CM | POA: Insufficient documentation

## 2018-01-07 DIAGNOSIS — E7800 Pure hypercholesterolemia, unspecified: Secondary | ICD-10-CM | POA: Insufficient documentation

## 2018-01-07 DIAGNOSIS — Z95828 Presence of other vascular implants and grafts: Secondary | ICD-10-CM

## 2018-01-07 HISTORY — PX: PORTACATH PLACEMENT: SHX2246

## 2018-01-07 SURGERY — INSERTION, TUNNELED CENTRAL VENOUS DEVICE, WITH PORT
Anesthesia: Monitor Anesthesia Care | Site: Chest | Laterality: Left

## 2018-01-07 MED ORDER — MIDAZOLAM HCL 2 MG/2ML IJ SOLN
INTRAMUSCULAR | Status: AC
  Filled 2018-01-07: qty 2

## 2018-01-07 MED ORDER — LACTATED RINGERS IV SOLN
INTRAVENOUS | Status: DC
  Administered 2018-01-07: 1000 mL via INTRAVENOUS

## 2018-01-07 MED ORDER — HYDROMORPHONE HCL 1 MG/ML IJ SOLN: 0.2500 mg | INTRAMUSCULAR | Status: DC | PRN

## 2018-01-07 MED ORDER — HEPARIN SOD (PORK) LOCK FLUSH 100 UNIT/ML IV SOLN
INTRAVENOUS | Status: DC | PRN
  Administered 2018-01-07: 500 [IU] via INTRAVENOUS

## 2018-01-07 MED ORDER — LIDOCAINE HCL (PF) 1 % IJ SOLN
INTRAMUSCULAR | Status: AC
  Filled 2018-01-07: qty 30

## 2018-01-07 MED ORDER — MIDAZOLAM HCL 5 MG/5ML IJ SOLN
INTRAMUSCULAR | Status: DC | PRN
  Administered 2018-01-07: 2 mg via INTRAVENOUS

## 2018-01-07 MED ORDER — FENTANYL CITRATE (PF) 100 MCG/2ML IJ SOLN
INTRAMUSCULAR | Status: AC
  Filled 2018-01-07: qty 2

## 2018-01-07 MED ORDER — CEFAZOLIN SODIUM-DEXTROSE 2-4 GM/100ML-% IV SOLN
2.0000 g | INTRAVENOUS | Status: AC
  Administered 2018-01-07: 2 g via INTRAVENOUS
  Filled 2018-01-07: qty 100

## 2018-01-07 MED ORDER — OXYCODONE-ACETAMINOPHEN 7.5-325 MG PO TABS: 1.0000 | ORAL_TABLET | Freq: Four times a day (QID) | ORAL | 0 refills | Status: DC | PRN

## 2018-01-07 MED ORDER — CHLORHEXIDINE GLUCONATE CLOTH 2 % EX PADS: 6.0000 | MEDICATED_PAD | Freq: Once | CUTANEOUS | Status: DC

## 2018-01-07 MED ORDER — PROMETHAZINE HCL 25 MG/ML IJ SOLN: 6.2500 mg | INTRAMUSCULAR | Status: DC | PRN

## 2018-01-07 MED ORDER — HEPARIN SOD (PORK) LOCK FLUSH 100 UNIT/ML IV SOLN
INTRAVENOUS | Status: AC
  Filled 2018-01-07: qty 5

## 2018-01-07 MED ORDER — PROPOFOL 500 MG/50ML IV EMUL
INTRAVENOUS | Status: DC | PRN
  Administered 2018-01-07: 45 ug/kg/min via INTRAVENOUS

## 2018-01-07 MED ORDER — MEPERIDINE HCL 50 MG/ML IJ SOLN: 6.2500 mg | INTRAMUSCULAR | Status: DC | PRN

## 2018-01-07 MED ORDER — SODIUM CHLORIDE (PF) 0.9 % IJ SOLN
INTRAMUSCULAR | Status: DC | PRN
  Administered 2018-01-07: 500 mL

## 2018-01-07 MED ORDER — LIDOCAINE HCL (PF) 1 % IJ SOLN
INTRAMUSCULAR | Status: DC | PRN
  Administered 2018-01-07: 7 mL

## 2018-01-07 MED ORDER — FENTANYL CITRATE (PF) 100 MCG/2ML IJ SOLN
INTRAMUSCULAR | Status: DC | PRN
  Administered 2018-01-07 (×2): 25 ug via INTRAVENOUS

## 2018-01-07 MED ORDER — HYDROCODONE-ACETAMINOPHEN 7.5-325 MG PO TABS: 1.0000 | ORAL_TABLET | Freq: Once | ORAL | Status: DC | PRN

## 2018-01-07 MED ORDER — LACTATED RINGERS IV SOLN: INTRAVENOUS | Status: DC

## 2018-01-07 MED ORDER — KETOROLAC TROMETHAMINE 30 MG/ML IJ SOLN: 30.0000 mg | Freq: Once | INTRAMUSCULAR | Status: DC

## 2018-01-07 SURGICAL SUPPLY — 30 items
BAG DECANTER FOR FLEXI CONT (MISCELLANEOUS) ×3 IMPLANT
CHLORAPREP W/TINT 10.5 ML (MISCELLANEOUS) ×3 IMPLANT
CLOTH BEACON ORANGE TIMEOUT ST (SAFETY) ×3 IMPLANT
COVER LIGHT HANDLE STERIS (MISCELLANEOUS) ×6 IMPLANT
COVER WAND RF STERILE (DRAPES) ×3 IMPLANT
DECANTER SPIKE VIAL GLASS SM (MISCELLANEOUS) ×3 IMPLANT
DERMABOND ADVANCED (GAUZE/BANDAGES/DRESSINGS) ×2
DERMABOND ADVANCED .7 DNX12 (GAUZE/BANDAGES/DRESSINGS) ×1 IMPLANT
DRAPE C-ARM FOLDED MOBILE STRL (DRAPES) ×3 IMPLANT
ELECT REM PT RETURN 9FT ADLT (ELECTROSURGICAL) ×3
ELECTRODE REM PT RTRN 9FT ADLT (ELECTROSURGICAL) ×1 IMPLANT
GLOVE BIOGEL PI IND STRL 7.0 (GLOVE) ×2 IMPLANT
GLOVE BIOGEL PI INDICATOR 7.0 (GLOVE) ×4
GLOVE ECLIPSE 6.5 STRL STRAW (GLOVE) ×3 IMPLANT
GLOVE SURG SS PI 7.5 STRL IVOR (GLOVE) ×3 IMPLANT
GOWN STRL REUS W/TWL LRG LVL3 (GOWN DISPOSABLE) ×6 IMPLANT
IV NS 500ML (IV SOLUTION) ×2
IV NS 500ML BAXH (IV SOLUTION) ×1 IMPLANT
KIT PORT POWER 8FR ISP MRI (Port) ×3 IMPLANT
KIT TURNOVER KIT A (KITS) ×3 IMPLANT
NEEDLE HYPO 25X1 1.5 SAFETY (NEEDLE) ×3 IMPLANT
PACK MINOR (CUSTOM PROCEDURE TRAY) ×3 IMPLANT
PAD ARMBOARD 7.5X6 YLW CONV (MISCELLANEOUS) ×3 IMPLANT
SET BASIN LINEN APH (SET/KITS/TRAYS/PACK) ×3 IMPLANT
SUT MNCRL AB 4-0 PS2 18 (SUTURE) ×3 IMPLANT
SUT VIC AB 3-0 SH 27 (SUTURE) ×2
SUT VIC AB 3-0 SH 27X BRD (SUTURE) ×1 IMPLANT
SYR 20CC LL (SYRINGE) ×3 IMPLANT
SYR 5ML LL (SYRINGE) ×3 IMPLANT
SYR CONTROL 10ML LL (SYRINGE) ×3 IMPLANT

## 2018-01-07 NOTE — Transfer of Care (Signed)
Immediate Anesthesia Transfer of Care Note  Patient: Erik [name]  Procedure(s) Performed: INSERTION PORT-A-CATH (N/A )  Patient Location: PACU  Anesthesia Type:MAC  Level of Consciousness: awake and patient cooperative  Airway & Oxygen Therapy: Patient Spontanous Breathing and Patient connected to nasal cannula oxygen  Post-op Assessment: Report given to RN, Post -op Vital signs reviewed and stable and Patient moving all extremities  Post vital signs: Reviewed and stable  Last Vitals:  Vitals Value Taken Time  BP    Temp    Pulse    Resp    SpO2      Last Pain:  Vitals:   01/07/18 0930  PainSc: 4          Complications: No apparent anesthesia complications

## 2018-01-07 NOTE — Discharge Instructions (Signed)
PATIENT INSTRUCTIONS POST-ANESTHESIA  IMMEDIATELY FOLLOWING SURGERY:  Do not drive or operate machinery for the first twenty four hours after surgery.  Do not make any important decisions for twenty four hours after surgery or while taking narcotic pain medications or sedatives.  If you develop intractable nausea and vomiting or a severe headache please notify your doctor immediately.  FOLLOW-UP:  Please make an appointment with your surgeon as instructed. You do not need to follow up with anesthesia unless specifically instructed to do so.  WOUND CARE INSTRUCTIONS (if applicable):  Keep a dry clean dressing on the anesthesia/puncture wound site if there is drainage.  Once the wound has quit draining you may leave it open to air.  Generally you should leave the bandage intact for twenty four hours unless there is drainage.  If the epidural site drains for more than 36-48 hours please call the anesthesia department.  QUESTIONS?:  Please feel free to call your physician or the hospital operator if you have any questions, and they will be happy to assist you.      Implanted Port Insertion, Care After This sheet gives you information about how to care for yourself after your procedure. Your health care provider may also give you more specific instructions. If you have problems or questions, contact your health care provider. What can I expect after the procedure? After your procedure, it is common to have:  Discomfort at the port insertion site.  Bruising on the skin over the port. This should improve over 3-4 days.  Follow these instructions at home: [facility] care  After your port is placed, you will get a manufacturer's information card. The card has information about your port. Keep this card with you at all times.  Take care of the port as told by your health care provider. Ask your health care provider if you or a family member can get training for taking care of the port at home. A home  health care nurse may also take care of the port.  Make sure to remember what type of port you have. Incision care  Follow instructions from your health care provider about how to take care of your port insertion site. Make sure you: ? Wash your hands with soap and water before you change your bandage (dressing). If soap and water are not available, use hand sanitizer. ? Change your dressing as told by your health care provider. ? Leave stitches (sutures), skin glue, or adhesive strips in place. These skin closures may need to stay in place for 2 weeks or longer. If adhesive strip edges start to loosen and curl up, you may trim the loose edges. Do not remove adhesive strips completely unless your health care provider tells you to do that.  Check your port insertion site every day for signs of infection. Check for: ? More redness, swelling, or pain. ? More fluid or blood. ? Warmth. ? Pus or a bad smell. General instructions  Do not take baths, swim, or use a hot tub until your health care provider approves.  Do not lift anything that is heavier than 10 lb (4.5 kg) for a week, or as told by your health care provider.  Ask your health care provider when it is okay to: ? Return to work or school. ? Resume usual physical activities or sports.  Do not drive for 24 hours if you were given a medicine to help you relax (sedative).  Take over-the-counter and prescription medicines only as told  by your health care provider.  Wear a medical alert bracelet in case of an emergency. This will tell any health care providers that you have a port.  Keep all follow-up visits as told by your health care provider. This is important. Contact a health care provider if:  You cannot flush your port with saline as directed, or you cannot draw blood from the port.  You have a fever or chills.  You have more redness, swelling, or pain around your port insertion site.  You have more fluid or blood coming  from your port insertion site.  Your port insertion site feels warm to the touch.  You have pus or a bad smell coming from the port insertion site. Get help right away if:  You have chest pain or shortness of breath.  You have bleeding from your port that you cannot control. Summary  Take care of the port as told by your health care provider.  Change your dressing as told by your health care provider.  Keep all follow-up visits as told by your health care provider. This information is not intended to replace advice given to you by your health care provider. Make sure you discuss any questions you have with your health care provider. Document Released: 11/13/2012 Document Revised: 12/15/2015 Document Reviewed: 12/15/2015 Elsevier Interactive Patient Education  2017 [place] An implanted port is a type of central line that is placed under the skin. Central lines are used to provide IV access when treatment or nutrition needs to be given through a persons veins. Implanted ports are used for long-term IV access. An implanted port may be placed because:  You need IV medicine that would be irritating to the small veins in your hands or arms.  You need long-term IV medicines, such as antibiotics.  You need IV nutrition for a long period.  You need frequent blood draws for lab tests.  You need dialysis.  Implanted ports are usually placed in the chest area, but they can also be placed in the upper arm, the abdomen, or the leg. An implanted port has two main parts:  Reservoir. The reservoir is round and will appear as a small, raised area under your skin. The reservoir is the part where a needle is inserted to give medicines or draw blood.  Catheter. The catheter is a thin, flexible tube that extends from the reservoir. The catheter is placed into a large vein. Medicine that is inserted into the reservoir goes into the catheter and then into the  vein.  How will I care for my incision site? Do not get the incision site wet. Bathe or shower as directed by your health care provider. How is my port accessed? Special steps must be taken to access the port:  Before the port is accessed, a numbing cream can be placed on the skin. This helps numb the skin over the port site.  Your health care provider uses a sterile technique to access the port. ? Your health care provider must put on a mask and sterile gloves. ? The skin over your port is cleaned carefully with an antiseptic and allowed to dry. ? The port is gently pinched between sterile gloves, and a needle is inserted into the port.  Only "non-coring" port needles should be used to access the port. Once the port is accessed, a blood return should be checked. This helps ensure that the port is in the vein and is not clogged.  If your port needs to remain accessed for a constant infusion, a clear (transparent) bandage will be placed over the needle site. The bandage and needle will need to be changed every week, or as directed by your health care provider.  Keep the bandage covering the needle clean and dry. Do not get it wet. Follow your health care providers instructions on how to take a shower or bath while the port is accessed.  If your port does not need to stay accessed, no bandage is needed over the port.  What is flushing? Flushing helps keep the port from getting clogged. Follow your health care providers instructions on how and when to flush the port. Ports are usually flushed with saline solution or a medicine called heparin. The need for flushing will depend on how the port is used.  If the port is used for intermittent medicines or blood draws, the port will need to be flushed: ? After medicines have been given. ? After blood has been drawn. ? As part of routine maintenance.  If a constant infusion is running, the port may not need to be flushed.  How long will my  port stay implanted? The port can stay in for as long as your health care provider thinks it is needed. When it is time for the port to come out, surgery will be done to remove it. The procedure is similar to the one performed when the port was put in. When should I seek immediate medical care? When you have an implanted port, you should seek immediate medical care if:  You notice a bad smell coming from the incision site.  You have swelling, redness, or drainage at the incision site.  You have more swelling or pain at the port site or the surrounding area.  You have a fever that is not controlled with medicine.  This information is not intended to replace advice given to you by your health care provider. Make sure you discuss any questions you have with your health care provider. Document Released: 01/23/2005 Document Revised: 07/01/2015 Document Reviewed: 09/30/2012 Elsevier Interactive Patient Education  2017 [employer].

## 2018-01-07 NOTE — Anesthesia Preprocedure Evaluation (Signed)
Anesthesia Evaluation    Airway Mallampati: II       Dental  (+) Teeth Intact, Dental Advidsory Given   Pulmonary COPD, former smoker,    breath sounds clear to auscultation       Cardiovascular hypertension, On Medications + CAD   Rhythm:regular     Neuro/Psych PSYCHIATRIC DISORDERS Anxiety Depression    GI/Hepatic GERD  ,  Endo/Other    Renal/GU      Musculoskeletal   Abdominal   Peds  Hematology   Anesthesia Other Findings AdenoCA, s/p exp lap (12/19/17) dx w/ Carcinomatosis Tx plan chemo tx H/O heavy ETOH abuse until 2010 COPD, tobacco abuse- d/c'd 12/09/17   SAH/SDH, with h/o CHI- "coma, 4 weeks" s/p crani   Reproductive/Obstetrics                             Anesthesia Physical Anesthesia Plan  ASA: IV  Anesthesia Plan: MAC   Post-op Pain Management:    Induction:   PONV Risk Score and Plan:   Airway Management Planned:   Additional Equipment:   Intra-op Plan:   Post-operative Plan:   Informed Consent:   Plan Discussed with: Anesthesiologist  Anesthesia Plan Comments:         Anesthesia Quick Evaluation

## 2018-01-07 NOTE — Interval H&P Note (Signed)
History and Physical Interval Note:  01/07/2018 9:38 AM  Erik [name]  has presented today for surgery, with the diagnosis of colon cancer  The various methods of treatment have been discussed with the patient and family. After consideration of risks, benefits and other options for treatment, the patient has consented to  Procedure(s): INSERTION PORT-A-CATH (N/A) as a surgical intervention .  The patient's history has been reviewed, patient examined, no change in status, stable for surgery.  I have reviewed the patient's chart and labs.  Questions were answered to the patient's satisfaction.     Aviva Signs

## 2018-01-07 NOTE — Anesthesia Postprocedure Evaluation (Signed)
Anesthesia Post Note  Patient: Erik [name]  Procedure(s) Performed: INSERTION PORT-A-CATH (N/A )  Patient location during evaluation: PACU Anesthesia Type: MAC Level of consciousness: awake and alert and patient cooperative Pain management: pain level controlled Vital Signs Assessment: post-procedure vital signs reviewed and stable Respiratory status: spontaneous breathing, nonlabored ventilation and respiratory function stable Cardiovascular status: blood pressure returned to baseline Postop Assessment: no apparent nausea or vomiting Anesthetic complications: no     Last Vitals:  Vitals:   01/07/18 1000 01/07/18 1103  BP: 131/74   Pulse: 80   Resp: 18   Temp:  36.9 C  SpO2: 94%     Last Pain:  Vitals:   01/07/18 0930  PainSc: 4                  Charmin Aguiniga J

## 2018-01-07 NOTE — Op Note (Signed)
Patient:  Erik [name]  DOB:  May 13, 1955  MRN:  [id number]   Preop Diagnosis: Recurrent colon carcinoma  Postop Diagnosis: Same  Procedure: Port-A-Cath insertion  Surgeon: Aviva Signs, MD  Anes: MAC  Indications: Patient is a 62 year old white male recently diagnosed with recurrent colon cancer who now presents for Port-A-Cath insertion.  He is about to undergo further chemotherapy.  The risks and benefits of the procedure including bleeding, infection, and pneumothorax were fully explained to the patient, who gave informed consent.  Procedure note: The patient was placed in the Trendelenburg position after the left upper chest was prepped and draped using the usual sterile technique with DuraPrep.  Surgical site confirmation was performed.  1% Xylocaine was used for local anesthesia.  An incision was made below the left clavicle.  A subcutaneous pocket was formed.  A needle was advanced into the left subclavian vein using the Seldinger technique without difficulty.  The guidewire was then advanced into the right atrium under fluoroscopic guidance.  An introducer and peel-away sheath were placed over the guidewire.  The catheter was then inserted through the peel-away sheath and the peel-away sheath was removed.  The catheter was then attached to the port and the port placed in subcutaneous pocket.  Adequate positioning was confirmed by fluoroscopy.  Adequate blood flow was noted on aspiration of the port.  Port was flushed with heparin flush.  Subcutaneous layer was reapproximated using 3-0 Vicryl interrupted suture.  Skin was closed using a 4-0 Monocryl subcuticular suture.  Dermabond was applied.  All tape and needle counts were correct at the end of the procedure.  Patient was transferred to PACU in stable condition.  A chest x-ray will be performed at that time.  Complications: None  EBL: Minimal  Specimen: None

## 2018-01-08 ENCOUNTER — Encounter (HOSPITAL_COMMUNITY): Payer: Self-pay | Admitting: General Surgery

## 2018-01-08 ENCOUNTER — Other Ambulatory Visit (HOSPITAL_COMMUNITY): Payer: Self-pay | Admitting: Internal Medicine

## 2018-01-08 DIAGNOSIS — T451X5A Adverse effect of antineoplastic and immunosuppressive drugs, initial encounter: Principal | ICD-10-CM

## 2018-01-08 DIAGNOSIS — G620 Drug-induced polyneuropathy: Secondary | ICD-10-CM

## 2018-01-09 ENCOUNTER — Inpatient Hospital Stay (HOSPITAL_COMMUNITY): Payer: Medicaid Other

## 2018-01-09 ENCOUNTER — Encounter (HOSPITAL_COMMUNITY): Payer: Self-pay

## 2018-01-09 ENCOUNTER — Encounter (HOSPITAL_COMMUNITY): Payer: Self-pay | Admitting: Internal Medicine

## 2018-01-09 ENCOUNTER — Inpatient Hospital Stay (HOSPITAL_COMMUNITY): Payer: Medicaid Other | Attending: Hematology

## 2018-01-09 VITALS — BP 136/73 | HR 84 | Temp 98.8°F | Resp 18 | Wt 233.6 lb

## 2018-01-09 DIAGNOSIS — C772 Secondary and unspecified malignant neoplasm of intra-abdominal lymph nodes: Principal | ICD-10-CM

## 2018-01-09 DIAGNOSIS — C188 Malignant neoplasm of overlapping sites of colon: Secondary | ICD-10-CM

## 2018-01-09 DIAGNOSIS — Z5111 Encounter for antineoplastic chemotherapy: Secondary | ICD-10-CM | POA: Insufficient documentation

## 2018-01-09 DIAGNOSIS — C786 Secondary malignant neoplasm of retroperitoneum and peritoneum: Secondary | ICD-10-CM | POA: Insufficient documentation

## 2018-01-09 DIAGNOSIS — C184 Malignant neoplasm of transverse colon: Secondary | ICD-10-CM

## 2018-01-09 LAB — CBC WITH DIFFERENTIAL/PLATELET
Abs Immature Granulocytes: 0.03 10*3/uL (ref 0.00–0.07)
Basophils Absolute: 0.1 10*3/uL (ref 0.0–0.1)
Basophils Relative: 1 %
Eosinophils Absolute: 0.2 10*3/uL (ref 0.0–0.5)
Eosinophils Relative: 2 %
HCT: 48.2 % (ref 39.0–52.0)
Hemoglobin: 15.2 g/dL (ref 13.0–17.0)
Immature Granulocytes: 0 %
Lymphocytes Relative: 13 %
Lymphs Abs: 1.2 10*3/uL (ref 0.7–4.0)
MCH: 30.2 pg (ref 26.0–34.0)
MCHC: 31.5 g/dL (ref 30.0–36.0)
MCV: 95.8 fL (ref 80.0–100.0)
Monocytes Absolute: 0.7 10*3/uL (ref 0.1–1.0)
Monocytes Relative: 8 %
NRBC: 0 % (ref 0.0–0.2)
Neutro Abs: 6.9 10*3/uL (ref 1.7–7.7)
Neutrophils Relative %: 76 %
Platelets: 272 10*3/uL (ref 150–400)
RBC: 5.03 MIL/uL (ref 4.22–5.81)
RDW: 13.4 % (ref 11.5–15.5)
WBC: 9.1 10*3/uL (ref 4.0–10.5)

## 2018-01-09 LAB — COMPREHENSIVE METABOLIC PANEL
ALBUMIN: 3.9 g/dL (ref 3.5–5.0)
ALT: 12 U/L (ref 0–44)
AST: 17 U/L (ref 15–41)
Alkaline Phosphatase: 78 U/L (ref 38–126)
Anion gap: 8 (ref 5–15)
BILIRUBIN TOTAL: 0.4 mg/dL (ref 0.3–1.2)
BUN: 9 mg/dL (ref 6–20)
CO2: 29 mmol/L (ref 22–32)
Calcium: 9.1 mg/dL (ref 8.9–10.3)
Chloride: 101 mmol/L (ref 98–111)
Creatinine, Ser: 0.82 mg/dL (ref 0.61–1.24)
GFR calc Af Amer: >60 mL/min (ref >60)
GFR calc non Af Amer: >60 mL/min (ref >60)
GLUCOSE: 107 mg/dL — AB (ref 70–99)
Potassium: 4.6 mmol/L (ref 3.5–5.1)
Sodium: 138 mmol/L (ref 135–145)
Total Protein: 8 g/dL (ref 6.5–8.1)

## 2018-01-09 LAB — URINALYSIS, DIPSTICK ONLY
Bilirubin Urine: NEGATIVE
Glucose, UA: NEGATIVE mg/dL
Hgb urine dipstick: NEGATIVE
Ketones, ur: NEGATIVE mg/dL
Leukocytes, UA: NEGATIVE
Nitrite: NEGATIVE
Protein, ur: NEGATIVE mg/dL
Specific Gravity, Urine: 1.016 (ref 1.005–1.030)
pH: 5 (ref 5.0–8.0)

## 2018-01-09 LAB — MAGNESIUM: Magnesium: 1.9 mg/dL (ref 1.7–2.4)

## 2018-01-09 MED ORDER — IRINOTECAN HCL CHEMO INJECTION 100 MG/5ML
180.0000 mg/m2 | Freq: Once | INTRAVENOUS | Status: AC
  Administered 2018-01-09: 420 mg via INTRAVENOUS
  Filled 2018-01-09: qty 21

## 2018-01-09 MED ORDER — ATROPINE SULFATE 1 MG/ML IJ SOLN
0.5000 mg | Freq: Once | INTRAMUSCULAR | Status: AC | PRN
  Administered 2018-01-09: 0.5 mg via INTRAVENOUS
  Filled 2018-01-09: qty 1

## 2018-01-09 MED ORDER — PALONOSETRON HCL INJECTION 0.25 MG/5ML
0.2500 mg | Freq: Once | INTRAVENOUS | Status: AC
  Administered 2018-01-09: 0.25 mg via INTRAVENOUS
  Filled 2018-01-09: qty 5

## 2018-01-09 MED ORDER — SODIUM CHLORIDE 0.9 % IV SOLN
2400.0000 mg/m2 | INTRAVENOUS | Status: DC
  Administered 2018-01-09: 5650 mg via INTRAVENOUS
  Filled 2018-01-09: qty 113

## 2018-01-09 MED ORDER — FLUOROURACIL CHEMO INJECTION 2.5 GM/50ML
400.0000 mg/m2 | Freq: Once | INTRAVENOUS | Status: AC
  Administered 2018-01-09: 950 mg via INTRAVENOUS
  Filled 2018-01-09: qty 19

## 2018-01-09 MED ORDER — SODIUM CHLORIDE 0.9 % IV SOLN
Freq: Once | INTRAVENOUS | Status: AC
  Administered 2018-01-09: 11:00:00 via INTRAVENOUS

## 2018-01-09 MED ORDER — SODIUM CHLORIDE 0.9 % IV SOLN
10.0000 mg | Freq: Once | INTRAVENOUS | Status: AC
  Administered 2018-01-09: 10 mg via INTRAVENOUS
  Filled 2018-01-09: qty 1

## 2018-01-09 MED ORDER — SODIUM CHLORIDE 0.9 % IV SOLN
Freq: Once | INTRAVENOUS | Status: AC
  Administered 2018-01-09: 09:00:00 via INTRAVENOUS

## 2018-01-09 MED ORDER — LEUCOVORIN CALCIUM INJECTION 350 MG
900.0000 mg | Freq: Once | INTRAVENOUS | Status: AC
  Administered 2018-01-09: 900 mg via INTRAVENOUS
  Filled 2018-01-09: qty 10

## 2018-01-09 MED ORDER — SODIUM CHLORIDE 0.9% FLUSH
10.0000 mL | INTRAVENOUS | Status: DC | PRN
  Administered 2018-01-09: 10 mL
  Filled 2018-01-09: qty 10

## 2018-01-09 NOTE — Patient Instructions (Signed)
[facility] Discharge Instructions for Patients Receiving Chemotherapy  Today you received the following chemotherapy agents irinotecan, leucovorin, adruicil.    If you develop nausea and vomiting that is not controlled by your nausea medication, call the clinic.   BELOW ARE SYMPTOMS THAT SHOULD BE REPORTED IMMEDIATELY:  *FEVER GREATER THAN 100.5 F  *CHILLS WITH OR WITHOUT FEVER  NAUSEA AND VOMITING THAT IS NOT CONTROLLED WITH YOUR NAUSEA MEDICATION  *UNUSUAL SHORTNESS OF BREATH  *UNUSUAL BRUISING OR BLEEDING  TENDERNESS IN MOUTH AND THROAT WITH OR WITHOUT PRESENCE OF ULCERS  *URINARY PROBLEMS  *BOWEL PROBLEMS  UNUSUAL RASH Items with * indicate a potential emergency and should be followed up as soon as possible.  Feel free to call the clinic should you have any questions or concerns. The clinic phone number is [phone].  Please show the [place] at check-in to the Emergency Department and triage nurse.

## 2018-01-09 NOTE — Progress Notes (Signed)
Patient to treatment room for chemotherapy.  Consent signed.  Information reviewed with all questions asked and answered.  Written information given for home review.  Patient has nausea medications and diarrhea meds at home if needed.  Port site clean and dry with skin glue intact.  No s/s of distress noted.     Chemotherapy spill kit given and reviewed with the patient.  [facility] system telephone number reviewed with understanding verbalized by the patient.    Patient tolerated treatment with no complaints voiced.  Port site clean and dry with dressing intact.  Chemotherapy pump connected with no alarms noted.  VSS with discharge and left ambulatory with no s/s of distress noted.

## 2018-01-11 ENCOUNTER — Inpatient Hospital Stay (HOSPITAL_COMMUNITY): Payer: Medicaid Other

## 2018-01-11 ENCOUNTER — Encounter (HOSPITAL_COMMUNITY): Payer: Self-pay

## 2018-01-11 VITALS — BP 115/70 | HR 90 | Temp 97.7°F | Resp 18

## 2018-01-11 DIAGNOSIS — C184 Malignant neoplasm of transverse colon: Secondary | ICD-10-CM

## 2018-01-11 DIAGNOSIS — C188 Malignant neoplasm of overlapping sites of colon: Secondary | ICD-10-CM

## 2018-01-11 DIAGNOSIS — Z5111 Encounter for antineoplastic chemotherapy: Secondary | ICD-10-CM | POA: Diagnosis not present

## 2018-01-11 MED ORDER — SODIUM CHLORIDE 0.9% FLUSH
10.0000 mL | INTRAVENOUS | Status: DC | PRN
  Administered 2018-01-11: 10 mL
  Filled 2018-01-11: qty 10

## 2018-01-11 MED ORDER — HEPARIN SOD (PORK) LOCK FLUSH 100 UNIT/ML IV SOLN
500.0000 [IU] | Freq: Once | INTRAVENOUS | Status: AC | PRN
  Administered 2018-01-11: 500 [IU]

## 2018-01-11 MED ORDER — HEPARIN SOD (PORK) LOCK FLUSH 100 UNIT/ML IV SOLN
INTRAVENOUS | Status: AC
  Filled 2018-01-11: qty 5

## 2018-01-11 NOTE — Progress Notes (Signed)
Erik [name] tolerated 5FU pump discontinuation well without complaints or incident. 5FU pump discontinued with portacath flushed per protocol then de-accessed. VSS Pt discharged self ambulatory using cane in satisfactory condition

## 2018-01-11 NOTE — Patient Instructions (Signed)
Erik [name] at [facility] Discharge Instructions  5FU pymp discontinued with portacath flushed per protocol. Follow-up as scheduled. Call clinic for any questions or concerns   Thank you for choosing [place] at [facility] to provide your oncology and hematology care.  To afford each patient quality time with our provider, please arrive at least 15 minutes before your scheduled appointment time.   If you have a lab appointment with the [facility] please come in thru the  Main Entrance and check in at the main information desk  You need to re-schedule your appointment should you arrive 10 or more minutes late.  We strive to give you quality time with our providers, and arriving late affects you and other patients whose appointments are after yours.  Also, if you no show three or more times for appointments you may be dismissed from the clinic at the providers discretion.     Again, thank you for choosing [facility].  Our hope is that these requests will decrease the amount of time that you wait before being seen by our physicians.       _____________________________________________________________  Should you have questions after your visit to [facility], please contact our office at [phone] between the hours of 8:00 a.m. and 4:30 p.m.  Voicemails left after 4:00 p.m. will not be returned until the following business day.  For prescription refill requests, have your pharmacy contact our office and allow 72 hours.    Cancer Center Support Programs:   > Cancer Support Group  2nd Tuesday of the month 1pm-2pm, Journey Room

## 2018-01-14 ENCOUNTER — Telehealth (HOSPITAL_COMMUNITY): Payer: Self-pay

## 2018-01-14 NOTE — Telephone Encounter (Signed)
24 hour follow up-patient stated he is not having any issues at this time other than just feeling tired. Will call if he has any other questions.

## 2018-01-15 ENCOUNTER — Encounter (HOSPITAL_COMMUNITY): Payer: Self-pay | Admitting: Internal Medicine

## 2018-01-16 ENCOUNTER — Other Ambulatory Visit (HOSPITAL_COMMUNITY): Payer: Self-pay | Admitting: Internal Medicine

## 2018-01-16 ENCOUNTER — Other Ambulatory Visit (HOSPITAL_COMMUNITY): Payer: Self-pay | Admitting: Pharmacist

## 2018-01-16 DIAGNOSIS — C189 Malignant neoplasm of colon, unspecified: Secondary | ICD-10-CM | POA: Insufficient documentation

## 2018-01-23 ENCOUNTER — Inpatient Hospital Stay (HOSPITAL_COMMUNITY): Payer: Medicaid Other | Attending: Hematology

## 2018-01-23 ENCOUNTER — Inpatient Hospital Stay (HOSPITAL_BASED_OUTPATIENT_CLINIC_OR_DEPARTMENT_OTHER): Payer: Medicaid Other | Admitting: Internal Medicine

## 2018-01-23 ENCOUNTER — Inpatient Hospital Stay (HOSPITAL_COMMUNITY): Payer: Medicaid Other

## 2018-01-23 VITALS — BP 164/86 | HR 112 | Temp 99.3°F | Resp 18 | Wt 234.4 lb

## 2018-01-23 DIAGNOSIS — Z5111 Encounter for antineoplastic chemotherapy: Secondary | ICD-10-CM | POA: Diagnosis not present

## 2018-01-23 DIAGNOSIS — E538 Deficiency of other specified B group vitamins: Secondary | ICD-10-CM

## 2018-01-23 DIAGNOSIS — C188 Malignant neoplasm of overlapping sites of colon: Secondary | ICD-10-CM | POA: Diagnosis not present

## 2018-01-23 DIAGNOSIS — G620 Drug-induced polyneuropathy: Secondary | ICD-10-CM

## 2018-01-23 DIAGNOSIS — C772 Secondary and unspecified malignant neoplasm of intra-abdominal lymph nodes: Secondary | ICD-10-CM | POA: Diagnosis not present

## 2018-01-23 DIAGNOSIS — Z87891 Personal history of nicotine dependence: Secondary | ICD-10-CM

## 2018-01-23 DIAGNOSIS — C184 Malignant neoplasm of transverse colon: Secondary | ICD-10-CM

## 2018-01-23 DIAGNOSIS — C189 Malignant neoplasm of colon, unspecified: Secondary | ICD-10-CM

## 2018-01-23 DIAGNOSIS — C786 Secondary malignant neoplasm of retroperitoneum and peritoneum: Secondary | ICD-10-CM | POA: Diagnosis not present

## 2018-01-23 DIAGNOSIS — T451X5A Adverse effect of antineoplastic and immunosuppressive drugs, initial encounter: Secondary | ICD-10-CM

## 2018-01-23 DIAGNOSIS — R918 Other nonspecific abnormal finding of lung field: Secondary | ICD-10-CM | POA: Diagnosis not present

## 2018-01-23 DIAGNOSIS — I10 Essential (primary) hypertension: Secondary | ICD-10-CM

## 2018-01-23 LAB — CBC WITH DIFFERENTIAL/PLATELET
Abs Immature Granulocytes: 0.02 10*3/uL (ref 0.00–0.07)
Basophils Absolute: 0 10*3/uL (ref 0.0–0.1)
Basophils Relative: 0 %
Eosinophils Absolute: 0.1 10*3/uL (ref 0.0–0.5)
Eosinophils Relative: 2 %
HCT: 44.4 % (ref 39.0–52.0)
Hemoglobin: 14.3 g/dL (ref 13.0–17.0)
Immature Granulocytes: 0 %
Lymphocytes Relative: 16 %
Lymphs Abs: 1.1 10*3/uL (ref 0.7–4.0)
MCH: 30.6 pg (ref 26.0–34.0)
MCHC: 32.2 g/dL (ref 30.0–36.0)
MCV: 94.9 fL (ref 80.0–100.0)
MONOS PCT: 11 %
Monocytes Absolute: 0.7 10*3/uL (ref 0.1–1.0)
Neutro Abs: 4.8 10*3/uL (ref 1.7–7.7)
Neutrophils Relative %: 71 %
Platelets: 229 10*3/uL (ref 150–400)
RBC: 4.68 MIL/uL (ref 4.22–5.81)
RDW: 13.2 % (ref 11.5–15.5)
WBC: 6.8 10*3/uL (ref 4.0–10.5)
nRBC: 0 % (ref 0.0–0.2)

## 2018-01-23 LAB — COMPREHENSIVE METABOLIC PANEL
ALT: 32 U/L (ref 0–44)
AST: 30 U/L (ref 15–41)
Albumin: 3.7 g/dL (ref 3.5–5.0)
Alkaline Phosphatase: 94 U/L (ref 38–126)
Anion gap: 8 (ref 5–15)
BUN: 11 mg/dL (ref 6–20)
CALCIUM: 8.7 mg/dL — AB (ref 8.9–10.3)
CO2: 26 mmol/L (ref 22–32)
CREATININE: 0.9 mg/dL (ref 0.61–1.24)
Chloride: 102 mmol/L (ref 98–111)
GFR calc Af Amer: >60 mL/min (ref >60)
GFR calc non Af Amer: >60 mL/min (ref >60)
Glucose, Bld: 121 mg/dL — ABNORMAL HIGH (ref 70–99)
Potassium: 4.4 mmol/L (ref 3.5–5.1)
Sodium: 136 mmol/L (ref 135–145)
Total Bilirubin: 0.7 mg/dL (ref 0.3–1.2)
Total Protein: 7.3 g/dL (ref 6.5–8.1)

## 2018-01-23 LAB — URINALYSIS, DIPSTICK ONLY
BILIRUBIN URINE: NEGATIVE
Glucose, UA: NEGATIVE mg/dL
Hgb urine dipstick: NEGATIVE
Ketones, ur: NEGATIVE mg/dL
Leukocytes, UA: NEGATIVE
Nitrite: NEGATIVE
Protein, ur: NEGATIVE mg/dL
Specific Gravity, Urine: 1.023 (ref 1.005–1.030)
pH: 5 (ref 5.0–8.0)

## 2018-01-23 LAB — MAGNESIUM: Magnesium: 2.1 mg/dL (ref 1.7–2.4)

## 2018-01-23 MED ORDER — ATROPINE SULFATE 1 MG/ML IJ SOLN
0.5000 mg | Freq: Once | INTRAMUSCULAR | Status: AC | PRN
  Administered 2018-01-23: 0.5 mg via INTRAVENOUS

## 2018-01-23 MED ORDER — IRINOTECAN HCL CHEMO INJECTION 100 MG/5ML
180.0000 mg/m2 | Freq: Once | INTRAVENOUS | Status: AC
  Administered 2018-01-23: 420 mg via INTRAVENOUS
  Filled 2018-01-23: qty 15

## 2018-01-23 MED ORDER — SODIUM CHLORIDE 0.9 % IV SOLN
10.0000 mg | Freq: Once | INTRAVENOUS | Status: AC
  Administered 2018-01-23: 10 mg via INTRAVENOUS
  Filled 2018-01-23: qty 1

## 2018-01-23 MED ORDER — FLUOROURACIL CHEMO INJECTION 2.5 GM/50ML
400.0000 mg/m2 | Freq: Once | INTRAVENOUS | Status: AC
  Administered 2018-01-23: 950 mg via INTRAVENOUS
  Filled 2018-01-23: qty 19

## 2018-01-23 MED ORDER — PALONOSETRON HCL INJECTION 0.25 MG/5ML
0.2500 mg | Freq: Once | INTRAVENOUS | Status: AC
  Administered 2018-01-23: 0.25 mg via INTRAVENOUS

## 2018-01-23 MED ORDER — SODIUM CHLORIDE 0.9 % IV SOLN
2400.0000 mg/m2 | INTRAVENOUS | Status: DC
  Administered 2018-01-23: 5650 mg via INTRAVENOUS
  Filled 2018-01-23: qty 113

## 2018-01-23 MED ORDER — SODIUM CHLORIDE 0.9 % IV SOLN
Freq: Once | INTRAVENOUS | Status: AC
  Administered 2018-01-23: 12:00:00 via INTRAVENOUS

## 2018-01-23 MED ORDER — PALONOSETRON HCL INJECTION 0.25 MG/5ML
INTRAVENOUS | Status: AC
  Filled 2018-01-23: qty 5

## 2018-01-23 MED ORDER — LEUCOVORIN CALCIUM INJECTION 350 MG
900.0000 mg | Freq: Once | INTRAVENOUS | Status: AC
  Administered 2018-01-23: 900 mg via INTRAVENOUS
  Filled 2018-01-23: qty 10

## 2018-01-23 MED ORDER — SODIUM CHLORIDE 0.9 % IV SOLN
600.0000 mg | Freq: Once | INTRAVENOUS | Status: AC
  Administered 2018-01-23: 600 mg via INTRAVENOUS
  Filled 2018-01-23: qty 30

## 2018-01-23 MED ORDER — ATROPINE SULFATE 1 MG/ML IJ SOLN
INTRAMUSCULAR | Status: AC
  Filled 2018-01-23: qty 1

## 2018-01-23 NOTE — Progress Notes (Signed)
Diagnosis No diagnosis found.  Staging Cancer Staging Malignant neoplasm of transverse colon [facility]) Staging form: Colon and Rectum, AJCC 7th Edition - Pathologic stage from 03/04/2015: Stage IIIC (T4a, N2a, cM0) - Signed by Baird Cancer, PA-C on 03/04/2015   Assessment and Plan:   1. Stage IV CRC.  Pt was initially diagnosed with Stage IIIC adenocarcinoma of transverse colon.  62 yr old male previously followed by Dr. Talbert Cage.  He was diagnosed in 01/2015 on screening colonoscopy.  CEA elevated at time of diagnosis at 6.6. Treated with definitve surgery with right hemicolectomy by Dr. Adonis Huguenin on 02/12/15. Went on to have adjuvant chemo with FOLFOX x 12 cycles; chemo course was complicated by progressive peripheral neuropathy and thus Oxaliplatin was dose-reduced for cycles #6-11, and then d/c'd for cycle #12.   Pt had colonoscopy in 01/2016 with Dr. Oneida Alar. Path revealed 1 hyperplastic polyp.  No evidence of malignancy.    Pt was recommended for CT imaging but has not had that performed due to inability to get scan approved.    CT chest, abdomen and pelvis was recommended as his last imaging was in 10/2016 for ongoing assessment due to symptoms.    Pt had CT CAP done 10/23/2017 showed  IMPRESSION: 1. Subtle but new nodularity along the upper omentum along with nodular density in the pelvic mesentery adjacent to the sigmoid colon, concerning for peritoneal and omental implants of tumor. 2. Mild wall thickening in the sigmoid colon could reflect a low-grade colitis. 3. Trace perihepatic ascites. 4. Other imaging findings of potential clinical significance: Aortic Atherosclerosis (ICD10-I70.0) and Emphysema (ICD10-J43.9). Coronary atherosclerosis. Airway thickening is present, suggesting bronchitis or reactive airways disease. Degenerative right glenohumeral arthropathy with free osteochondral fragment in the subscapular recess. Nonobstructive single bilateral punctate renal calculi. Prior  right hemicolectomy. Mild prostatomegaly. Mild impingement at L4-5 and L5-S1.  Pt was recommended for PET scan which was done on 11/19/2017 that showed  IMPRESSION: 1. Similar to 10/23/2017 is progressive peritoneal nodularity worrisome for peritoneal carcinomatosis. This appears new when compared with 10/19/2016. 2. Increase in size of right paratracheal lymph node with mild FDG uptake. Nonspecific. There is also mild nonspecific increased uptake within the right hilar region. 3. Reticulonodular densities identified within both lungs with an upper lobe predominance are favored to represent postinflammatory changes. 4. Aortic Atherosclerosis (ICD10-I70.0) and Emphysema (ICD10-J43.9). 5. Coronary artery atherosclerotic calcifications.  Labs done 11/16/2017 reviewed and showed WBC 8.5 HB 14.8 and plts 192,000.  Chemistries WNL with K+ 3.8 Cr 0.84 and normal LFTs  CEA elevated at 12.9.  Previously, Scan discussed with Dr. Oneida Alar and IR.  Dr. Markus Daft  reviewed CT scan and pt was referred to IR for review for possible CT biopsy of omental lesions.   Biopsy was attempted on 11/30/2017 but was unsuccessful.    Pt was referred to Dr. Arnoldo Morale of surgery and underwent peritoneal biopsy of implants on 12/19/2017 with pathology returning as adenocarcinoma consistent with prior CRC.    Previously, I have discussed with pt multicenter trials have shown benefit of standard regimen of FolFIRI  in pts who have progressed after prior exposure to Oxaliplatin.  Goals of therapy discussed which is disease control.  Pt will have repeat imaging after 3 months of therapy.    Pt has completed C1 of Folfiri.  Foundation testing reviewed showed evidence of wild type KRAS.  I discussed with pt today randomized trial of vectibix plus FOLFIRI versus FOLFIRI alone after failure of initial FU-containing chemotherapy (two-thirds prior  oxaliplatin, 20 percent prior bevacizumab) showed that, in the KRAS wild-type group (n =  597), the addition of panitumumab was associated with a significant improvement in response rate (35 versus 10 percent) and median PFS (5.9 versus 3.9 months).  Overall regimen is well tolerated other than known toxicities associated with anti- EGFR therapy.    Side effects of Vectibix discussed with pt and he was provided written information.  Vectibix is dosed at 6 mg/kg IV every 2 weeks with FolFiri with 74f dosed at 400 mg/m2 with 5FU 2400 mg/m2 CIVI over 46 hours, Leucovorin 400 mg/m2, Irinotecan 180 mg/m2.  Treatment will be every 2 weeks.   Labs done 01/23/2018 reviewed and showed WBC 6.8 HB 14.3 plts 229,000.  Chemistries WNL with K+ 4.4 Cr 0.90 and normal LFTs.  Pt will proceed with C2 of Folfiri and C1 of Vectibix.    All questions answered and he expressed understanding of the information presented.   Pt will be seen for follow-up prior to C3 of Folfiri and C2 of Vectibix.  He is advised to notify the office if any problems prior to his next treatment.    2.  LUL pulmonary nodule/ Right paratracheal LN.  Pt had 6 mm nodule on scan done 04/2016.  CT chest done 10/23/2017 showed stable nodule.  Pet scan done 11/19/2017 shows paratracheal LN is nonspecific and measures 9 mm.  Pt will have repeat imaging after 3 months of therapy.    3  HTN.  BP is 145/75. Follow-up with PCP.    4.  Smoking.  Cessation is recommended.    5.  Neuropathy.  Continue neurontin.    25 minutes spent with more than 50% spent in counseling and coordination of care.    Interval History:  From TKirby Crigler PA-C's last note on 06/14/16)    Current Status:  Pt is seen today for follow-up to go over foundation testing and prior to C2 of Folfiri.    Oncology History   Stage IIIC ([facility] adenocarcinoma of transverse colon, diagnosed on colonoscopy by Dr. FOneida Alaron 01/12/2015 followed by definitive surgery by Dr. CClayburn Pertwith right hemicolectomy on 02/12/2015.  He then underwent FOLFOX x 12 cycles in the adjuvant  setting (03/30/2015- 09/06/2015).     Malignant [place] colon ([place]   01/12/2015 Pathologic Stage    Colon, biopsy, distal transverse - TUBULOVILLOUS ADENOMA WITH HIGH GRADE DYSPLASIA.    01/12/2015 Procedure    Colonoscopy by Dr. FOneida Alar    01/12/2015 Tumor Marker    CEA: 6.6 (H)     01/18/2015 Imaging    CT abd/pelvis- Apple-core lesion identified in the mid transverse colon without obstruction. No evidence for lymphadenopathy in the gastrohepatic ligament or omentum.  Stable 8 mm hypo attenuating lesion in the left liver, likely a cyst.    02/10/2015 Initial Diagnosis    Adenocarcinoma of transverse colon ([place]    02/12/2015 Definitive Surgery    CClayburn Pert Extended right hemicolectomy     02/12/2015 Pathology Results    Mucinous adenocarcinoma with penetration of visceral peritoneum, 4/19 lymph nodes for metastatic disease, negative resection margins, with LVI and perineural invasion    03/30/2015 - 09/06/2015 Chemotherapy    FOLFOX x 12 cycles    05/25/2015 Treatment Plan Change    5 FU bolus discontinued for cycle #5    06/08/2015 Treatment Plan Change    Treatment deferred x 1 week    06/15/2015 Treatment Plan Change    5FU CI  decreased by 10% and Oxaliplatin reduced by 15% for cycles #6-#11; Oxaliplatin dropped for cycle #12 d/t neuropathy.     10/20/2015 Imaging    CT CAP- Right hemicolectomy without evidence of metastatic disease. 2. Previously measured ground-glass lesion in the left upper lobe has resolved. 3. Probable food debris in the stomach, simulating gastric wall thickening. Please correlate clinically. 4. 6 mm irregular nodular density in the left upper lobe, stable. Continued attention on followup exams is warranted.    01/25/2016 Procedure    Colonoscopy by Dr. Oneida Alar- Non-thrombosed external hemorrhoids found on digital rectal exam. - One 4 mm polyp in the rectum, removed with a cold biopsy forceps. Resected and retrieved. - Congested  mucosa in the neo-terminal ileum. Biopsied. - Redundant colon. - Internal hemorrhoids.    01/26/2016 Pathology Results    1. Terminal ileum, biopsy - MILD ACUTE (ACTIVE) ILEITIS. - NO DYSPLASIA OR MALIGNANCY IDENTIFIED - SEE COMMENT. 2. Rectum, polyp(s) - HYPERPLASTIC POLYP (X 1). - NO DYSPLASIA OR MALIGNANCY IDENTIFIED.    04/13/2016 Imaging    CT chest- Stable CT chest. 6 mm irregular nodule anterior left upper lobe is stable. The scattered areas of peribronchovascular micro nodularity in the lungs bilaterally are unchanged.    10/19/2016 Imaging    CT CAP: 1. Status post right hemicolectomy. No findings to suggest metastatic disease in the abdomen or pelvis. 2. Aortic atherosclerosis. 3. Additional incidental findings, as above. Aortic Atherosclerosis (ICD10-I70.0).    01/09/2018 -  Chemotherapy    The patient had palonosetron (ALOXI) injection 0.25 mg, 0.25 mg, Intravenous,  Once, 2 of 4 cycles Administration: 0.25 mg (01/09/2018) irinotecan (CAMPTOSAR) 420 mg in dextrose 5 % 500 mL chemo infusion, 180 mg/m2 = 420 mg, Intravenous,  Once, 2 of 4 cycles Administration: 420 mg (01/09/2018) leucovorin 900 mg in dextrose 5 % 250 mL infusion, 944 mg, Intravenous,  Once, 2 of 4 cycles Administration: 900 mg (01/09/2018) fluorouracil (ADRUCIL) chemo injection 950 mg, 400 mg/m2 = 950 mg, Intravenous,  Once, 2 of 4 cycles Administration: 950 mg (01/09/2018) fluorouracil (ADRUCIL) 5,650 mg in sodium chloride 0.9 % 137 mL chemo infusion, 2,400 mg/m2 = 5,650 mg, Intravenous, 1 Day/Dose, 2 of 4 cycles Administration: 5,650 mg (01/09/2018)  for chemotherapy treatment.      Malignant neoplasm of overlapping sites of colon ([place])   12/26/2017 Initial Diagnosis    Cancer of overlapping sites of colon metastatic to intra-abdominal lymph node ([place])    01/09/2018 -  Chemotherapy    The patient had palonosetron (ALOXI) injection 0.25 mg, 0.25 mg, Intravenous,  Once, 2 of 4 cycles Administration:  0.25 mg (01/09/2018) irinotecan (CAMPTOSAR) 420 mg in dextrose 5 % 500 mL chemo infusion, 180 mg/m2 = 420 mg, Intravenous,  Once, 2 of 4 cycles Administration: 420 mg (01/09/2018) leucovorin 900 mg in dextrose 5 % 250 mL infusion, 944 mg, Intravenous,  Once, 2 of 4 cycles Administration: 900 mg (01/09/2018) fluorouracil (ADRUCIL) chemo injection 950 mg, 400 mg/m2 = 950 mg, Intravenous,  Once, 2 of 4 cycles Administration: 950 mg (01/09/2018) fluorouracil (ADRUCIL) 5,650 mg in sodium chloride 0.9 % 137 mL chemo infusion, 2,400 mg/m2 = 5,650 mg, Intravenous, 1 Day/Dose, 2 of 4 cycles Administration: 5,650 mg (01/09/2018)  for chemotherapy treatment.      Colon cancer metastasized to multiple sites [facility])   01/16/2018 Initial Diagnosis    Colon cancer metastasized to multiple sites [facility])    01/23/2018 -  Chemotherapy    The patient had panitumumab (VECTIBIX) 640 mg  in sodium chloride 0.9 % 100 mL chemo infusion, 6 mg/kg = 640 mg, Intravenous,  Once, 1 of 4 cycles  for chemotherapy treatment.       Problem List Patient Active Problem List   Diagnosis Date Noted  . Colon cancer metastasized to multiple sites [facility]) [C18.9] 01/16/2018  . Malignant neoplasm of overlapping sites of colon ([place]) [C18.8] 12/26/2017  . S/P exploratory laparotomy [Z98.890] 12/19/2017  . Carcinomatosis ([place]) [C80.0]   . Psoriasis [L40.9] 09/12/2016  . Pulmonary nodule [R91.1] 06/14/2016  . History of colon cancer [Z85.038]   . Diarrhea [R19.7] 01/06/2016  . Cigarette nicotine dependence with nicotine-induced disorder [F17.219] 06/18/2015  . Chronic obstructive pulmonary disease ([place]) [J44.9] 06/18/2015  . Depression [F32.9] 06/18/2015  . B12 deficiency [E53.8] 04/14/2015  . Malignant neoplasm of transverse colon ([place]) [C18.4] 02/10/2015  . GERD (gastroesophageal reflux disease) [K21.9] 12/15/2014  . History of colonic polyps [Z86.010] 12/15/2014  . History of alcohol abuse [F10.11] 09/16/2014  . Panic disorder  [F41.0] 10/10/2012  . Major depressive disorder, single episode [F32.9] 10/10/2012  . Bilateral lower extremity edema [R60.0] 04/24/2012  . Hyperlipidemia [E78.5] 04/21/2009  . CAD, NATIVE VESSEL [I25.10] 03/25/2009  . HEAD TRAUMA [S09.90XA] 03/02/2009    Past Medical History Past Medical History:  Diagnosis Date  . Abnormal stress echocardiogram   . Adenocarcinoma of transverse colon ([place]) 02/10/2015  . Alcohol abuse    Heavy Use up until 2010  . Anxiety   . Blood transfusion without reported diagnosis   . Cancer of overlapping sites of colon metastatic to intra-abdominal lymph node ([place]) 12/26/2017  . COPD (chronic obstructive pulmonary disease) ([place])   . Depression   . Emphysema of lung ([place])   . GERD (gastroesophageal reflux disease)   . Head trauma 2001   closed head injury; coma for 4 weeks  . Hypercholesterolemia   . Hypertension   . ING HERN W/GANGREN RECUR UNILAT/UNSPEC ING HERN 04/21/2009   Qualifier: Diagnosis of  By: Verl Blalock, MD, Delanna Ahmadi PTSD (post-traumatic stress disorder)   . Pulmonary nodule 06/14/2016  . SAH (subarachnoid hemorrhage) ([place]) 08/31/2012  . SDH (subdural hematoma) ([place]) 08/31/2012    Past Surgical History Past Surgical History:  Procedure Laterality Date  . BIOPSY  01/25/2016   Procedure: BIOPSY;  Surgeon: Danie Binder, MD;  [facility];  Service: Endoscopy;;  ileum;   . cardiac cath    . COLONOSCOPY  2011   Dr. Oneida Alar: multiple adenomas and hyperplastic polyps  . COLONOSCOPY WITH PROPOFOL N/A 01/12/2015   Procedure: COLONOSCOPY WITH PROPOFOL;  Surgeon: Danie Binder, MD;  [facility];  Service: Endoscopy;  Laterality: N/A;  1030  . COLONOSCOPY WITH PROPOFOL N/A 01/25/2016   Procedure: COLONOSCOPY WITH PROPOFOL;  Surgeon: Danie Binder, MD;  [facility];  Service: Endoscopy;  Laterality: N/A;  1230  . CRANIOTOMY  2001  . ESOPHAGOGASTRODUODENOSCOPY (EGD) WITH PROPOFOL N/A 01/12/2015   Procedure:  ESOPHAGOGASTRODUODENOSCOPY (EGD) WITH PROPOFOL;  Surgeon: Danie Binder, MD;  [facility];  Service: Endoscopy;  Laterality: N/A;  . head injury surgery    . HERNIA REPAIR Right 2012   Inguinal- [place]  . KIDNEY SURGERY     >30 years ago  . LAPAROSCOPIC RIGHT HEMI COLECTOMY Left 02/10/2015   Procedure: LAPAROSCOPIC THEN OPEN RIGHT HEMI COLECTOMY;  Surgeon: [name], MD;  Location: [facility] ORS;  Service: General;  Laterality: Left;  . LAPAROTOMY N/A 12/19/2017  Procedure: EXPLORATORY LAPAROTOMY;  Surgeon: Aviva Signs, MD;  Location: [facility];  Service: General;  Laterality: N/A;  . POLYPECTOMY  01/25/2016   Procedure: POLYPECTOMY;  Surgeon: Danie Binder, MD;  [facility];  Service: Endoscopy;;  colon  . PORT-A-CATH REMOVAL N/A 02/26/2017   Procedure: MINOR REMOVAL PORT-A-CATH;  Surgeon: Aviva Signs, MD;  Location: [facility];  Service: General;  Laterality: N/A;  Pt to arrive at [place] N/A 03/24/2015   Procedure: INSERTION PORT-A-CATH;  Surgeon: Jules Husbands, MD;  Location: [facility] ORS;  Service: General;  Laterality: N/A;  . PORTACATH PLACEMENT Left 01/07/2018   Procedure: INSERTION PORT A CATH (ATTACHED CATHETER IN LEFT SUBCLAVIAN);  Surgeon: Aviva Signs, MD;  Location: [facility];  Service: General;  Laterality: Left;    Family History Family History  Problem Relation Age of Onset  . Pulmonary embolism Mother   . Breast cancer Mother   . Cancer Mother        breast cancer  . Arthritis Mother   . Heart disease Father   . Cancer Father 45       Leukemia  . Cancer Paternal Grandfather        Lung  . Ataxia Neg Hx   . Chorea Neg Hx   . Dementia Neg Hx   . Mental retardation Neg Hx   . Migraines Neg Hx   . Multiple sclerosis Neg Hx   . Neurofibromatosis Neg Hx   . Neuropathy Neg Hx   . Parkinsonism Neg Hx   . Seizures Neg Hx   . Stroke Neg Hx   . Colon cancer Neg Hx      Social History  reports that he quit smoking about 6  weeks ago. His smoking use included cigarettes. He started smoking about 44 years ago. He has a 3.00 pack-year smoking history. He has never used smokeless tobacco. He reports current alcohol use. He reports that he does not use drugs.  Medications  Current Outpatient Medications:  .  amitriptyline (ELAVIL) 25 MG tablet, TAKE 1 TABLET BY MOUTH EVERY NIGHT AT BEDTIME (Patient taking differently: Take 25 mg by mouth at bedtime as needed for sleep. ), Disp: 30 tablet, Rfl: 0 .  atorvastatin (LIPITOR) 20 MG tablet, Take 1 tablet (20 mg total) by mouth at bedtime. (Patient taking differently: Take 20 mg by mouth every morning. ), Disp: 90 tablet, Rfl: 3 .  Bevacizumab (AVASTIN IV), Inject 525 mg into the vein every 14 (fourteen) days., Disp: , Rfl:  .  buPROPion (WELLBUTRIN SR) 150 MG 12 hr tablet, Take 150 mg by mouth every morning. , Disp: , Rfl:  .  dexamethasone (DECADRON) 4 MG tablet, Take 2 tablets (8 mg total) by mouth daily. Start the day after chemo for 2 days., Disp: 8 tablet, Rfl: 5 .  fluorouracil CALGB 72094 in sodium chloride 0.9 % 150 mL, Inject 5,650 mg into the vein. Over 46 hours, Disp: , Rfl:  .  FLUoxetine (PROZAC) 20 MG capsule, TAKE 1 CAPSULE BY MOUTH 2 TIMES A DAY (Patient taking differently: Take 20 mg by mouth 2 (two) times daily. ), Disp: 60 capsule, Rfl: 0 .  gabapentin (NEURONTIN) 300 MG capsule, TAKE 1 CAPSULE BY MOUTH IN THE MORNING AND 3 CAPSULES BY MOUTH AT BEDTIME, Disp: 120 capsule, Rfl: 0 .  IRINOTECAN HCL IV, Inject 420 mg into the vein every 14 (fourteen) days., Disp: , Rfl:  .  LEUCOVORIN CALCIUM IV, Inject 944 mg into  the vein every 14 (fourteen) days., Disp: , Rfl:  .  lidocaine-prilocaine (EMLA) cream, APPLY A SMALL AMOUNT OVER PORT SITE AND COVER WITH PLASTIC WRAP ONE HOUR PRIOR TO APPOINTMENT, Disp: 30 g, Rfl: 0 .  loperamide (IMODIUM A-D) 2 MG tablet, Take 2 at diarrhea onset , then 1 every 2hr until 12hrs with no BM. May take 2 every 4hrs at night. If diarrhea  recurs repeat., Disp: 100 tablet, Rfl: 1 .  loperamide (IMODIUM) 2 MG capsule, TAKE 1 TO 2 CAPSULES BY MOUTH EVERY 6 HOURS AS NEEDED FOR DIARRHEA OR LOOSE STOOLS (Patient taking differently: Take 2 mg by mouth every morning. ), Disp: 60 capsule, Rfl: 0 .  omeprazole (PRILOSEC) 20 MG capsule, Take 20 mg by mouth every morning. , Disp: , Rfl:  .  ondansetron (ZOFRAN) 8 MG tablet, Take 1 tablet (8 mg total) by mouth 2 (two) times daily as needed for refractory nausea / vomiting. Start on day 3 after chemotherapy., Disp: 30 tablet, Rfl: 1 .  oxyCODONE-acetaminophen (PERCOCET) 7.5-325 MG tablet, Take 1 tablet by mouth every 6 (six) hours as needed., Disp: 20 tablet, Rfl: 0 .  PROAIR HFA 108 (90 Base) MCG/ACT inhaler, INHALE 2 PUFFS BY MOUTH EVERY 6 HOURS AS NEEDED FOR SHORTNESS OF BREATH/WHEEZING. (Patient taking differently: Inhale 2 puffs into the lungs every 6 (six) hours as needed for wheezing or shortness of breath. ), Disp: 18 each, Rfl: 2 .  prochlorperazine (COMPAZINE) 10 MG tablet, Take 1 tablet (10 mg total) by mouth every 6 (six) hours as needed (NAUSEA)., Disp: 30 tablet, Rfl: 1 No current facility-administered medications for this visit.   Facility-Administered Medications Ordered in Other Visits:  .  fluorouracil (ADRUCIL) 5,650 mg in sodium chloride 0.9 % 137 mL chemo infusion, 2,400 mg/m2 (Treatment Plan Recorded), Intravenous, 1 day or 1 dose, Machell Wirthlin, MD, 5,650 mg at 01/23/18 1535  Allergies Patient has no known allergies.  Review of Systems Review of Systems - Oncology ROS negative mild abdominal pain.    Physical Exam  Vitals Wt Readings from Last 3 Encounters:  01/23/18 234 lb 6.4 oz (106.3 kg)  01/09/18 233 lb 9.6 oz (106 kg)  01/01/18 234 lb (106.1 kg)   Temp Readings from Last 3 Encounters:  01/23/18 98.8 F (37.1 C) (Oral)  01/11/18 97.7 F (36.5 C) (Oral)  01/09/18 98.8 F (37.1 C) (Oral)   BP Readings from Last 3 Encounters:  01/23/18 (!) 145/75    01/11/18 115/70  01/09/18 136/73   Pulse Readings from Last 3 Encounters:  01/23/18 100  01/11/18 90  01/09/18 84    Constitutional: Well-developed, well-nourished, and in no distress.   HENT: Head: Normocephalic and atraumatic.  Mouth/Throat: No oropharyngeal exudate. Mucosa moist. Eyes: Pupils are equal, round, and reactive to light. Conjunctivae are normal. No scleral icterus.  Neck: Normal range of motion. Neck supple. No JVD present.  Cardiovascular: Normal rate, regular rhythm and normal heart sounds.  Exam reveals no gallop and no friction rub.   No murmur heard. Pulmonary/Chest: Effort normal and breath sounds normal. No respiratory distress. No wheezes.No rales.  Abdominal: Soft. Bowel sounds are normal. No distension. Minor tenderness noted in epigastric area.  There is no guarding.  Musculoskeletal: No edema or tenderness.  Lymphadenopathy: No cervical, axillary or supraclavicular adenopathy.  Neurological: Alert and oriented to person, place, and time. No cranial nerve deficit.  Skin: Skin is warm and dry. No rash noted. No erythema. No pallor.  Psychiatric: Affect and judgment normal.  Labs Appointment on 01/23/2018  Component Date Value Ref Range Status  . WBC 01/23/2018 6.8  4.0 - 10.5 K/uL Final  . RBC 01/23/2018 4.68  4.22 - 5.81 MIL/uL Final  . Hemoglobin 01/23/2018 14.3  13.0 - 17.0 g/dL Final  . HCT 01/23/2018 44.4  39.0 - 52.0 % Final  . MCV 01/23/2018 94.9  80.0 - 100.0 fL Final  . MCH 01/23/2018 30.6  26.0 - 34.0 pg Final  . MCHC 01/23/2018 32.2  30.0 - 36.0 g/dL Final  . RDW 01/23/2018 13.2  11.5 - 15.5 % Final  . Platelets 01/23/2018 229  150 - 400 K/uL Final  . nRBC 01/23/2018 0.0  0.0 - 0.2 % Final  . Neutrophils Relative % 01/23/2018 71  % Final  . Neutro Abs 01/23/2018 4.8  1.7 - 7.7 K/uL Final  . Lymphocytes Relative 01/23/2018 16  % Final  . Lymphs Abs 01/23/2018 1.1  0.7 - 4.0 K/uL Final  . Monocytes Relative 01/23/2018 11  % Final  .  Monocytes Absolute 01/23/2018 0.7  0.1 - 1.0 K/uL Final  . Eosinophils Relative 01/23/2018 2  % Final  . Eosinophils Absolute 01/23/2018 0.1  0.0 - 0.5 K/uL Final  . Basophils Relative 01/23/2018 0  % Final  . Basophils Absolute 01/23/2018 0.0  0.0 - 0.1 K/uL Final  . Immature Granulocytes 01/23/2018 0  % Final  . Abs Immature Granulocytes 01/23/2018 0.02  0.00 - 0.07 K/uL Final   Performed at [facility], [address], [place], [place] [address]  . Sodium 01/23/2018 136  135 - 145 mmol/L Final  . Potassium 01/23/2018 4.4  3.5 - 5.1 mmol/L Final  . Chloride 01/23/2018 102  98 - 111 mmol/L Final  . CO2 01/23/2018 26  22 - 32 mmol/L Final  . Glucose, Bld 01/23/2018 121* 70 - 99 mg/dL Final  . BUN 01/23/2018 11  6 - 20 mg/dL Final  . Creatinine, Ser 01/23/2018 0.90  0.61 - 1.24 mg/dL Final  . Calcium 01/23/2018 8.7* 8.9 - 10.3 mg/dL Final  . Total Protein 01/23/2018 7.3  6.5 - 8.1 g/dL Final  . Albumin 01/23/2018 3.7  3.5 - 5.0 g/dL Final  . AST 01/23/2018 30  15 - 41 U/L Final  . ALT 01/23/2018 32  0 - 44 U/L Final  . Alkaline Phosphatase 01/23/2018 94  38 - 126 U/L Final  . Total Bilirubin 01/23/2018 0.7  0.3 - 1.2 mg/dL Final  . GFR calc non Af Amer 01/23/2018 >60  >60 mL/min Final  . GFR calc Af Amer 01/23/2018 >60  >60 mL/min Final  . Anion gap 01/23/2018 8  5 - 15 Final   Performed at [facility], [address], [place], [place] [address]  . Magnesium 01/23/2018 2.1  1.7 - 2.4 mg/dL Final   Performed at [facility], [address], [place], [place] [address]  . Color, Urine 01/23/2018 YELLOW  YELLOW Final  . APPearance 01/23/2018 CLEAR  CLEAR Final  . Specific Gravity, Urine 01/23/2018 1.023  1.005 - 1.030 Final  . pH 01/23/2018 5.0  5.0 - 8.0 Final  . Glucose, UA 01/23/2018 NEGATIVE  NEGATIVE mg/dL Final  . Hgb urine dipstick 01/23/2018 NEGATIVE  NEGATIVE Final  . Bilirubin Urine 01/23/2018 NEGATIVE  NEGATIVE Final  . Ketones, ur 01/23/2018 NEGATIVE  NEGATIVE mg/dL Final  .  Protein, ur 01/23/2018 NEGATIVE  NEGATIVE mg/dL Final  . Nitrite 01/23/2018 NEGATIVE  NEGATIVE Final  . Leukocytes, UA 01/23/2018 NEGATIVE  NEGATIVE Final  Performed at [facility], [address], [place], [place] [address]     Pathology No orders of the defined types were placed in this encounter.      Zoila Shutter MD

## 2018-01-24 ENCOUNTER — Other Ambulatory Visit: Payer: Self-pay

## 2018-01-24 ENCOUNTER — Encounter (HOSPITAL_COMMUNITY): Payer: Self-pay

## 2018-01-24 NOTE — Progress Notes (Signed)
Tolerated infusions w/o adverse reaction.  Alert, in no distress.  VSS.  Discharged ambulatory with home infusion pump infusing as ordered.

## 2018-01-25 ENCOUNTER — Encounter (HOSPITAL_COMMUNITY): Payer: Self-pay

## 2018-01-25 ENCOUNTER — Other Ambulatory Visit: Payer: Self-pay

## 2018-01-25 ENCOUNTER — Inpatient Hospital Stay (HOSPITAL_COMMUNITY): Payer: Medicaid Other | Attending: Hematology

## 2018-01-25 VITALS — BP 117/66 | HR 90 | Temp 98.2°F | Resp 18

## 2018-01-25 DIAGNOSIS — C786 Secondary malignant neoplasm of retroperitoneum and peritoneum: Secondary | ICD-10-CM | POA: Insufficient documentation

## 2018-01-25 DIAGNOSIS — C188 Malignant neoplasm of overlapping sites of colon: Secondary | ICD-10-CM | POA: Diagnosis present

## 2018-01-25 DIAGNOSIS — Z452 Encounter for adjustment and management of vascular access device: Secondary | ICD-10-CM | POA: Insufficient documentation

## 2018-01-25 DIAGNOSIS — C772 Secondary and unspecified malignant neoplasm of intra-abdominal lymph nodes: Secondary | ICD-10-CM | POA: Diagnosis not present

## 2018-01-25 DIAGNOSIS — C184 Malignant neoplasm of transverse colon: Secondary | ICD-10-CM

## 2018-01-25 MED ORDER — HEPARIN SOD (PORK) LOCK FLUSH 100 UNIT/ML IV SOLN
500.0000 [IU] | Freq: Once | INTRAVENOUS | Status: AC | PRN
  Administered 2018-01-25: 500 [IU]

## 2018-01-25 MED ORDER — SODIUM CHLORIDE 0.9% FLUSH
10.0000 mL | INTRAVENOUS | Status: DC | PRN
  Administered 2018-01-25: 10 mL
  Filled 2018-01-25: qty 10

## 2018-01-25 NOTE — Progress Notes (Signed)
Erik [name] presents to have home infusion pump d/c'd and for port-a-cath deaccess with flush.  Proper placement of portacath confirmed by CXR.  Portacath located left chest wall accessed with  H 20 needle.  Good blood return present. Portacath flushed with NS and 500U/76ml Heparin, and needle removed intact.  Procedure tolerated well and without incident.  Discharged ambulatory.

## 2018-01-29 ENCOUNTER — Ambulatory Visit: Payer: Medicaid Other | Admitting: Neurology

## 2018-02-01 ENCOUNTER — Other Ambulatory Visit (HOSPITAL_COMMUNITY): Payer: Self-pay | Admitting: Internal Medicine

## 2018-02-01 ENCOUNTER — Telehealth (HOSPITAL_COMMUNITY): Payer: Self-pay | Admitting: *Deleted

## 2018-02-01 MED ORDER — LIDOCAINE VISCOUS HCL 2 % MT SOLN: OROMUCOSAL | 0 refills | Status: DC

## 2018-02-01 MED ORDER — HYDROCORTISONE 1 % EX OINT: 1.0000 "application " | TOPICAL_OINTMENT | Freq: Two times a day (BID) | CUTANEOUS | 0 refills | Status: DC

## 2018-02-01 MED ORDER — DOXYCYCLINE HYCLATE 100 MG PO TABS: 100.0000 mg | ORAL_TABLET | Freq: Two times a day (BID) | ORAL | 0 refills | Status: DC

## 2018-02-01 NOTE — Telephone Encounter (Signed)
Pt called reporting rash to face, arms, scalp and chest that is painful and sore to touch.  Also c/o difficulty swallowing d/t pain in mouth and throat.  Denies difficulty breathing; denies itching.  Dr. Walden Field notified and requests that the pt present to clinic for assessment and examination.  Pt reports he does not have a ride and cannot make it to the clinic this afternoon.  MD notified - Rx doxycyline and magic mouthwash sent to [facility]. Pt informed that these medications are sent to his pharmacy.  Also instructed to report to the ED for any new or worsening s/s.  He verbalizes understanding.

## 2018-02-02 ENCOUNTER — Emergency Department (HOSPITAL_COMMUNITY)
Admission: EM | Admit: 2018-02-02 | Discharge: 2018-02-02 | Disposition: A | Discharge status: Home / Self Care | Payer: Medicaid Other | Attending: Emergency Medicine | Admitting: Emergency Medicine

## 2018-02-02 ENCOUNTER — Other Ambulatory Visit: Payer: Self-pay

## 2018-02-02 ENCOUNTER — Encounter (HOSPITAL_COMMUNITY): Payer: Self-pay | Admitting: Emergency Medicine

## 2018-02-02 DIAGNOSIS — T451X5A Adverse effect of antineoplastic and immunosuppressive drugs, initial encounter: Secondary | ICD-10-CM | POA: Diagnosis not present

## 2018-02-02 DIAGNOSIS — Z87891 Personal history of nicotine dependence: Secondary | ICD-10-CM | POA: Insufficient documentation

## 2018-02-02 DIAGNOSIS — I2510 Atherosclerotic heart disease of native coronary artery without angina pectoris: Secondary | ICD-10-CM | POA: Diagnosis not present

## 2018-02-02 DIAGNOSIS — I10 Essential (primary) hypertension: Secondary | ICD-10-CM | POA: Diagnosis not present

## 2018-02-02 DIAGNOSIS — Z79899 Other long term (current) drug therapy: Secondary | ICD-10-CM | POA: Insufficient documentation

## 2018-02-02 DIAGNOSIS — R21 Rash and other nonspecific skin eruption: Secondary | ICD-10-CM | POA: Diagnosis not present

## 2018-02-02 DIAGNOSIS — J449 Chronic obstructive pulmonary disease, unspecified: Secondary | ICD-10-CM | POA: Insufficient documentation

## 2018-02-02 DIAGNOSIS — T50905D Adverse effect of unspecified drugs, medicaments and biological substances, subsequent encounter: Secondary | ICD-10-CM

## 2018-02-02 LAB — CBC WITH DIFFERENTIAL/PLATELET
Abs Immature Granulocytes: 0.01 10*3/uL (ref 0.00–0.07)
Basophils Absolute: 0 10*3/uL (ref 0.0–0.1)
Basophils Relative: 0 %
EOS ABS: 0.1 10*3/uL (ref 0.0–0.5)
EOS PCT: 1 %
HCT: 40.3 % (ref 39.0–52.0)
Hemoglobin: 12.9 g/dL — ABNORMAL LOW (ref 13.0–17.0)
Immature Granulocytes: 0 %
Lymphocytes Relative: 19 %
Lymphs Abs: 1.1 10*3/uL (ref 0.7–4.0)
MCH: 30.2 pg (ref 26.0–34.0)
MCHC: 32 g/dL (ref 30.0–36.0)
MCV: 94.4 fL (ref 80.0–100.0)
Monocytes Absolute: 0.6 10*3/uL (ref 0.1–1.0)
Monocytes Relative: 11 %
Neutro Abs: 3.8 10*3/uL (ref 1.7–7.7)
Neutrophils Relative %: 69 %
Platelets: 261 10*3/uL (ref 150–400)
RBC: 4.27 MIL/uL (ref 4.22–5.81)
RDW: 13.2 % (ref 11.5–15.5)
WBC: 5.6 10*3/uL (ref 4.0–10.5)
nRBC: 0 % (ref 0.0–0.2)

## 2018-02-02 LAB — DIC (DISSEMINATED INTRAVASCULAR COAGULATION)PANEL
D-Dimer, Quant: 0.95 ug/mL-FEU — ABNORMAL HIGH (ref 0.00–0.50)
Fibrinogen: 671 mg/dL — ABNORMAL HIGH (ref 210–475)
INR: 1.03
Prothrombin Time: 13.5 seconds (ref 11.4–15.2)
Smear Review: NONE SEEN
aPTT: 73 seconds — ABNORMAL HIGH (ref 24–36)

## 2018-02-02 LAB — COMPREHENSIVE METABOLIC PANEL
ALK PHOS: 73 U/L (ref 38–126)
ALT: 30 U/L (ref 0–44)
AST: 33 U/L (ref 15–41)
Albumin: 3.3 g/dL — ABNORMAL LOW (ref 3.5–5.0)
Anion gap: 9 (ref 5–15)
BUN: 13 mg/dL (ref 6–20)
CALCIUM: 9.2 mg/dL (ref 8.9–10.3)
CO2: 28 mmol/L (ref 22–32)
Chloride: 100 mmol/L (ref 98–111)
Creatinine, Ser: 0.93 mg/dL (ref 0.61–1.24)
GFR calc Af Amer: >60 mL/min (ref >60)
GFR calc non Af Amer: >60 mL/min (ref >60)
Glucose, Bld: 126 mg/dL — ABNORMAL HIGH (ref 70–99)
Potassium: 3.9 mmol/L (ref 3.5–5.1)
Sodium: 137 mmol/L (ref 135–145)
Total Bilirubin: 0.5 mg/dL (ref 0.3–1.2)
Total Protein: 7.1 g/dL (ref 6.5–8.1)

## 2018-02-02 LAB — DIC (DISSEMINATED INTRAVASCULAR COAGULATION) PANEL: PLATELETS: 255 10*3/uL (ref 150–400)

## 2018-02-02 LAB — SEDIMENTATION RATE: Sed Rate: 45 mm/hr — ABNORMAL HIGH (ref 0–16)

## 2018-02-02 LAB — C-REACTIVE PROTEIN: CRP: 4.7 mg/dL — ABNORMAL HIGH (ref <1.0)

## 2018-02-02 MED ORDER — LIDOCAINE VISCOUS HCL 2 % MT SOLN: 15.0000 mL | OROMUCOSAL | 0 refills | Status: DC | PRN

## 2018-02-02 MED ORDER — LIDOCAINE VISCOUS HCL 2 % MT SOLN
15.0000 mL | OROMUCOSAL | Status: DC | PRN
  Administered 2018-02-02: 15 mL via OROMUCOSAL
  Filled 2018-02-02: qty 15

## 2018-02-02 NOTE — ED Triage Notes (Addendum)
Patient believes he is having reaction after starting new type of chemo for colon cancer. Patient states started as a "red splotchy rash to face" x 2 weeks ago but now has swelling with blister in and on nose that are opening and bleeding. Patient placed on Doxycyline on 12/26. Patient states now rash is spreading and is inside mouth. Patient states "feels like throat is starting to swell and having difficulty swallowing" that started yesterday. Denies taking any benadryl.

## 2018-02-02 NOTE — Discharge Instructions (Addendum)
Follow-up with your oncologist, for further care and treatment.  We sent a different prescription to your pharmacy, hopefully it will be available.  The one your doctor sent yesterday is similar but also contains Maalox.  It would not be safe to use both of these together.  For the rash and discomfort of your nose, use a warm compress on it for 5 times a day for 30 minutes.  Also generally for your rash clean well daily with soap and water.  You can try soaking in a tub as well, to improve your discomfort.  Return here, if needed, for problems.

## 2018-02-02 NOTE — ED Provider Notes (Signed)
[facility] EMERGENCY DEPARTMENT Provider Note   CSN: [id number] Arrival date & time: 02/02/18  1245     History   Chief Complaint Chief Complaint  Patient presents with  . Medication Reaction    HPI Erik [name] is a 62 y.o. male.  HPI   Patient is here for evaluation of a rash which has worsened over the last 10 days.  It started after his last chemotherapy treatment with a new medication, second dose, about 2 weeks ago.  His oncologist told him it was a reaction to the chemotherapy.  He was seen 8 days ago, and started on doxycycline, for possible infection.  He is concerned because the rash extended to his face, lips and mouth, and nose.  The rash in his nose, bleeds when he touches it.  He is concerned that things are worsening.  He also feels like he is having trouble swallowing, because of the sores of his mouth.  He denies fever, chills, nausea, vomiting, weakness or dizziness.  There are no other known modifying factors.  Past Medical History:  Diagnosis Date  . Abnormal stress echocardiogram   . Adenocarcinoma of transverse colon ([place]) 02/10/2015  . Alcohol abuse    Heavy Use up until 2010  . Anxiety   . Blood transfusion without reported diagnosis   . Cancer of overlapping sites of colon metastatic to intra-abdominal lymph node ([place]) 12/26/2017  . COPD (chronic obstructive pulmonary disease) ([place])   . Depression   . Emphysema of lung ([place])   . GERD (gastroesophageal reflux disease)   . Head trauma 2001   closed head injury; coma for 4 weeks  . Hypercholesterolemia   . Hypertension   . ING HERN W/GANGREN RECUR UNILAT/UNSPEC ING HERN 04/21/2009   Qualifier: Diagnosis of  By: Verl Blalock, MD, Delanna Ahmadi PTSD (post-traumatic stress disorder)   . Pulmonary nodule 06/14/2016  . SAH (subarachnoid hemorrhage) ([place]) 08/31/2012  . SDH (subdural hematoma) ([place]) 08/31/2012    Patient Active Problem List   Diagnosis Date Noted  . Colon cancer metastasized to multiple  sites ([place]) 01/16/2018  . Malignant neoplasm of overlapping sites of colon ([place]) 12/26/2017  . S/P exploratory laparotomy 12/19/2017  . Carcinomatosis ([place])   . Psoriasis 09/12/2016  . Pulmonary nodule 06/14/2016  . History of colon cancer   . Diarrhea 01/06/2016  . Cigarette nicotine dependence with nicotine-induced disorder 06/18/2015  . Chronic obstructive pulmonary disease ([place]) 06/18/2015  . Depression 06/18/2015  . B12 deficiency 04/14/2015  . Malignant neoplasm of transverse colon ([place]) 02/10/2015  . GERD (gastroesophageal reflux disease) 12/15/2014  . History of colonic polyps 12/15/2014  . History of alcohol abuse 09/16/2014  . Panic disorder 10/10/2012  . Major depressive disorder, single episode 10/10/2012  . Bilateral lower extremity edema 04/24/2012  . Hyperlipidemia 04/21/2009  . CAD, NATIVE VESSEL 03/25/2009  . HEAD TRAUMA 03/02/2009    Past Surgical History:  Procedure Laterality Date  . BIOPSY  01/25/2016   Procedure: BIOPSY;  Surgeon: Danie Binder, MD;  Location[facility];  Service: Endoscopy;;  ileum;   . cardiac cath    . COLONOSCOPY  2011   Dr. Oneida Alar: multiple adenomas and hyperplastic polyps  . COLONOSCOPY WITH PROPOFOL N/A 01/12/2015   Procedure: COLONOSCOPY WITH PROPOFOL;  Surgeon: Danie Binder, MD;  Location: [facility];  Service: Endoscopy;  Laterality: N/A;  1030  . COLONOSCOPY WITH PROPOFOL N/A 01/25/2016   Procedure: COLONOSCOPY WITH PROPOFOL;  Surgeon: Carlyon Prows  Rexene Edison, MD;  Location: [facility];  Service: Endoscopy;  Laterality: N/A;  1230  . CRANIOTOMY  2001  . ESOPHAGOGASTRODUODENOSCOPY (EGD) WITH PROPOFOL N/A 01/12/2015   Procedure: ESOPHAGOGASTRODUODENOSCOPY (EGD) WITH PROPOFOL;  Surgeon: Danie Binder, MD;  Location: [facility];  Service: Endoscopy;  Laterality: N/A;  . head injury surgery    . HERNIA REPAIR Right 2012   Inguinal- Forestine Na  . KIDNEY SURGERY     >30 years ago  . LAPAROSCOPIC RIGHT HEMI COLECTOMY Left  02/10/2015   Procedure: LAPAROSCOPIC THEN OPEN RIGHT HEMI COLECTOMY;  Surgeon: [name], MD;  Location: [facility] ORS;  Service: General;  Laterality: Left;  . LAPAROTOMY N/A 12/19/2017   Procedure: EXPLORATORY LAPAROTOMY;  Surgeon: Aviva Signs, MD;  Location: [facility];  Service: General;  Laterality: N/A;  . POLYPECTOMY  01/25/2016   Procedure: POLYPECTOMY;  Surgeon: Danie Binder, MD;  Location: [facility];  Service: Endoscopy;;  colon  . PORT-A-CATH REMOVAL N/A 02/26/2017   Procedure: MINOR REMOVAL PORT-A-CATH;  Surgeon: Aviva Signs, MD;  Location: [facility] OR[facility];  Service: General;  Laterality: N/A;  Pt to arrive at [place] N/A 03/24/2015   Procedure: INSERTION PORT-A-CATH;  Surgeon: Jules Husbands, MD;  Location: [facility] ORS;  Service: General;  Laterality: N/A;  . PORTACATH PLACEMENT Left 01/07/2018   Procedure: INSERTION PORT A CATH (ATTACHED CATHETER IN LEFT SUBCLAVIAN);  Surgeon: Aviva Signs, MD;  Location: [facility];  Service: General;  Laterality: Left;        Home Medications    Prior to Admission medications   Medication Sig Start Date End Date Taking? Authorizing Provider  amitriptyline (ELAVIL) 25 MG tablet TAKE 1 TABLET BY MOUTH EVERY NIGHT AT BEDTIME Patient taking differently: Take 25 mg by mouth at bedtime as needed for sleep.  06/22/17  Yes Derek Jack, MD  atorvastatin (LIPITOR) 20 MG tablet Take 1 tablet (20 mg total) by mouth at bedtime. Patient taking differently: Take 20 mg by mouth every morning.  01/02/17  Yes Raylene Everts, MD  buPROPion [facility] SR) 150 MG 12 hr tablet Take 150 mg by mouth every morning.    Yes [provider]  busPIRone (BUSPAR) 10 MG tablet Take 10 mg by mouth 2 (two) times daily.   Yes [provider]  dexamethasone (DECADRON) 4 MG tablet Take 2 tablets (8 mg total) by mouth daily. Start the day after chemo for 2 days. 12/27/17  Yes Higgs, Mathis Dad, MD  doxycycline (VIBRA-TABS) 100 MG tablet Take 1  tablet (100 mg total) by mouth 2 (two) times daily for 7 days. 02/01/18 02/08/18 Yes Higgs, Mathis Dad, MD  fluorouracil CALGB 50277 in sodium chloride 0.9 % 150 mL Inject 5,650 mg into the vein. Over 46 hours   Yes [provider]  FLUoxetine (PROZAC) 20 MG capsule TAKE 1 CAPSULE BY MOUTH 2 TIMES A DAY Patient taking differently: Take 20 mg by mouth 2 (two) times daily.  06/14/16  Yes [name], PA-C  gabapentin (NEURONTIN) 300 MG capsule TAKE 1 CAPSULE BY MOUTH IN THE MORNING AND 3 CAPSULES BY MOUTH AT BEDTIME 01/09/18  Yes Higgs, Mathis Dad, MD  IRINOTECAN HCL IV Inject 420 mg into the vein every 14 (fourteen) days.   Yes [provider]  LEUCOVORIN CALCIUM IV Inject 944 mg into the vein every 14 (fourteen) days.   Yes [provider]  lidocaine-prilocaine (EMLA) cream APPLY A SMALL AMOUNT OVER PORT SITE AND COVER WITH PLASTIC WRAP ONE  HOUR PRIOR TO APPOINTMENT 01/09/18  Yes Higgs, Mathis Dad, MD  loperamide (IMODIUM) 2 MG capsule TAKE 1 TO 2 CAPSULES BY MOUTH EVERY 6 HOURS AS NEEDED FOR DIARRHEA OR LOOSE STOOLS Patient taking differently: Take 2 mg by mouth every morning.  06/22/17  Yes Derek Jack, MD  omeprazole (PRILOSEC) 20 MG capsule Take 20 mg by mouth every morning.    Yes [provider]  ondansetron (ZOFRAN) 8 MG tablet Take 1 tablet (8 mg total) by mouth 2 (two) times daily as needed for refractory nausea / vomiting. Start on day 3 after chemotherapy. 12/27/17  Yes Higgs, Mathis Dad, MD  Panitumumab (VECTIBIX IV) Inject 600 mg into the vein every 14 (fourteen) days.   Yes [provider]  PROAIR HFA 108 (90 Base) MCG/ACT inhaler INHALE 2 PUFFS BY MOUTH EVERY 6 HOURS AS NEEDED FOR SHORTNESS OF BREATH/WHEEZING. Patient taking differently: Inhale 2 puffs into the lungs every 6 (six) hours as needed for wheezing or shortness of breath.  02/13/17  Yes Raylene Everts, MD  prochlorperazine (COMPAZINE) 10 MG tablet Take 1 tablet (10 mg total) by mouth every 6  (six) hours as needed (NAUSEA). 12/27/17  Yes Higgs, Mathis Dad, MD  hydrocortisone 1 % ointment Apply 1 application topically 2 (two) times daily. 02/01/18   Higgs, Mathis Dad, MD  lidocaine (XYLOCAINE) 2 % solution Use as directed 15 mLs in the mouth or throat every 4 (four) hours as needed for mouth pain (as an aid to eat and drink). 02/02/18   Daleen Bo, MD  oxyCODONE-acetaminophen (PERCOCET) 7.5-325 MG tablet Take 1 tablet by mouth every 6 (six) hours as needed. Patient not taking: Reported on 02/02/2018 01/07/18 01/07/19  Aviva Signs, MD    Family History Family History  Problem Relation Age of Onset  . Pulmonary embolism Mother   . Breast cancer Mother   . Cancer Mother        breast cancer  . Arthritis Mother   . Heart disease Father   . Cancer Father 22       Leukemia  . Cancer Paternal Grandfather        Lung  . Ataxia Neg Hx   . Chorea Neg Hx   . Dementia Neg Hx   . Mental retardation Neg Hx   . Migraines Neg Hx   . Multiple sclerosis Neg Hx   . Neurofibromatosis Neg Hx   . Neuropathy Neg Hx   . Parkinsonism Neg Hx   . Seizures Neg Hx   . Stroke Neg Hx   . Colon cancer Neg Hx     Social History Social History   Tobacco Use  . Smoking status: Former Smoker    Packs/day: 0.10    Years: 30.00    Pack years: 3.00    Types: Cigarettes    Start date: 02/06/1973    Last attempt to quit: 12/09/2017    Years since quitting: 0.1  . Smokeless tobacco: Never Used  . Tobacco comment: Quit on 12/09/2017  Substance Use Topics  . Alcohol use: Yes    Alcohol/week: 0.0 standard drinks    Comment: beer occ, history of ETOH abuse in remote past.   . Drug use: No     Allergies   Patient has no known allergies.   Review of Systems Review of Systems  All other systems reviewed and are negative.    Physical Exam Updated Vital Signs BP 118/79   Pulse 98   Temp 98.2 F (36.8 C) (Oral)  Resp 15   Ht 6\' 2"  (1.88 m)   Wt 108.9 kg   SpO2 96%   BMI 30.81 kg/m    Physical Exam Vitals signs and nursing note reviewed.  Constitutional:      General: He is not in acute distress.    Appearance: He is well-developed. He is not ill-appearing or diaphoretic.  HENT:     Head: Normocephalic.     Comments: No lesions of scalp or deformities of the cranium.    Right Ear: External ear normal.     Left Ear: External ear normal.     Nose: No congestion or rhinorrhea.     Comments: Pustular eruption of the entire nose, with some excoriations, and areas of recent bleeding.  No deformity of the nose.    Mouth/Throat:     Mouth: Mucous membranes are dry.     Comments: There are lesions of the mucosa of the lower lip, both wet and dry mucosa.  These lesions are characterized by small red papules, with some excoriation.  There are no areas of vesicles. Eyes:     Conjunctiva/sclera: Conjunctivae normal.     Pupils: Pupils are equal, round, and reactive to light.  Neck:     Musculoskeletal: Normal range of motion and neck supple.     Trachea: Phonation normal.  Cardiovascular:     Rate and Rhythm: Normal rate and regular rhythm.     Heart sounds: Normal heart sounds.  Pulmonary:     Effort: Pulmonary effort is normal.     Breath sounds: Normal breath sounds.  Abdominal:     Palpations: Abdomen is soft.     Tenderness: There is no abdominal tenderness.  Musculoskeletal: Normal range of motion.  Skin:    General: Skin is warm and dry.     Comments: Scattered red rash trunk, arms, face.  Some of these have excoriations associated with him.  There are no areas of petechiae, vesicles or pustules with the exception of the nose.  Neurological:     Mental Status: He is alert and oriented to person, place, and time.     Cranial Nerves: No cranial nerve deficit.     Sensory: No sensory deficit.     Motor: No abnormal muscle tone.     Coordination: Coordination normal.  Psychiatric:        Mood and Affect: Mood normal.        Behavior: Behavior normal.         Thought Content: Thought content normal.        Judgment: Judgment normal.      ED Treatments / Results  Labs (all labs ordered are listed, but only abnormal results are displayed) Labs Reviewed  COMPREHENSIVE METABOLIC PANEL - Abnormal; Notable for the following components:      Result Value   Glucose, Bld 126 (*)    Albumin 3.3 (*)    All other components within normal limits  CBC WITH DIFFERENTIAL/PLATELET - Abnormal; Notable for the following components:   Hemoglobin 12.9 (*)    All other components within normal limits  SEDIMENTATION RATE - Abnormal; Notable for the following components:   Sed Rate 45 (*)    All other components within normal limits  DIC (DISSEMINATED INTRAVASCULAR COAGULATION) PANEL - Abnormal; Notable for the following components:   aPTT 73 (*)    Fibrinogen 671 (*)    D-Dimer, Quant 0.95 (*)    All other components within normal limits  C-REACTIVE PROTEIN  EKG None  Radiology No results found.  Procedures Procedures (including critical care time)  Medications Ordered in ED Medications  lidocaine (XYLOCAINE) 2 % viscous mouth solution 15 mL (15 mLs Mouth/Throat Given 02/02/18 1417)     Initial Impression / Assessment and Plan / ED Course  I have reviewed the triage vital signs and the nursing notes.  Pertinent labs & imaging results that were available during my care of the patient were reviewed by me and considered in my medical decision making (see chart for details).     Patient Vitals for the past 24 hrs:  BP Temp Temp src Pulse Resp SpO2 Height Weight  02/02/18 1630 118/79 - - 98 - - - -  02/02/18 1615 - - - 99 - - - -  02/02/18 1600 127/78 - - 98 - - - -  02/02/18 1540 118/78 - - (!) 102 15 96 % - -  02/02/18 1430 124/81 - - (!) 106 - - - -  02/02/18 1400 130/79 - - 99 (!) 21 - - -  02/02/18 1330 121/80 - - (!) 104 20 - - -  02/02/18 1313 - - - (!) 107 15 - - -  02/02/18 1309 - - - (!) 106 (!) 22 - - -  02/02/18 1257 (!)  160/142 98.2 F (36.8 C) Oral (!) 110 20 97 % 6\' 2"  (1.88 m) 108.9 kg    4:30 PM Reevaluation with update and discussion. After initial assessment and treatment, an updated evaluation reveals he remains comfortable.  He states he got transient relief from viscous Xylocaine oral swish.  His oncologist called in a prescription for Magic mouthwash, yesterday which he has not gotten yet.  Also at that time they called in a second prescription for doxycycline.  Findings discussed with the patient and all of his questions were answered. Daleen Bo   Medical Decision Making: Rash onset after chemotherapy, as an apparent typical response to the agent he is on.  Rash is progressed despite being treated with doxycycline.  Patient was evaluated with extensive testing, screen for DIC complications because of the erythematous rash.  Laboratory parameters are not consistent with DIC.  Skin reaction is not consistent with Stevens-Johnson syndrome.  Pustular reaction of the nose, is nonspecific and likely is manifestation of the chemotherapy agent reaction.  Patient is nontoxic.  Doubt sepsis, metabolic instability or impending vascular collapse.  CRITICAL CARE-no Performed by: Daleen Bo  Nursing Notes Reviewed/ Care Coordinated Applicable Imaging Reviewed Interpretation of Laboratory Data incorporated into ED treatment  The patient appears reasonably screened and/or stabilized for discharge and I doubt any other medical condition or other [facility] requiring further screening, evaluation, or treatment in the ED at this time prior to discharge.  Plan: Home Medications-continue usual medications; Home Treatments-wound care at home; return here if the recommended treatment, does not improve the symptoms; Recommended follow up-oncology as scheduled, and as needed  Final Clinical Impressions(s) / ED Diagnoses   Final diagnoses:  Adverse effect of drug, subsequent encounter    ED Discharge Orders          Ordered    lidocaine (XYLOCAINE) 2 % solution  Every 4 hours PRN     02/02/18 1638           Daleen Bo, MD 02/02/18 1641

## 2018-02-04 ENCOUNTER — Other Ambulatory Visit (HOSPITAL_COMMUNITY): Payer: Self-pay | Admitting: Internal Medicine

## 2018-02-04 DIAGNOSIS — T451X5A Adverse effect of antineoplastic and immunosuppressive drugs, initial encounter: Principal | ICD-10-CM

## 2018-02-04 DIAGNOSIS — G620 Drug-induced polyneuropathy: Secondary | ICD-10-CM

## 2018-02-05 ENCOUNTER — Other Ambulatory Visit (HOSPITAL_COMMUNITY): Payer: Medicaid Other

## 2018-02-05 ENCOUNTER — Ambulatory Visit (HOSPITAL_COMMUNITY): Payer: Medicaid Other

## 2018-02-05 ENCOUNTER — Other Ambulatory Visit: Payer: Self-pay | Admitting: Internal Medicine

## 2018-02-05 ENCOUNTER — Other Ambulatory Visit (HOSPITAL_COMMUNITY): Payer: Self-pay | Admitting: Emergency Medicine

## 2018-02-05 ENCOUNTER — Encounter (HOSPITAL_COMMUNITY): Payer: Medicaid Other

## 2018-02-05 DIAGNOSIS — C184 Malignant neoplasm of transverse colon: Secondary | ICD-10-CM

## 2018-02-05 MED ORDER — LOPERAMIDE HCL 2 MG PO CAPS: ORAL_CAPSULE | ORAL | 0 refills | Status: DC

## 2018-02-07 ENCOUNTER — Other Ambulatory Visit (HOSPITAL_COMMUNITY): Payer: Self-pay | Admitting: *Deleted

## 2018-02-07 ENCOUNTER — Inpatient Hospital Stay (HOSPITAL_COMMUNITY): Payer: Medicaid Other | Admitting: Hematology

## 2018-02-07 DIAGNOSIS — R21 Rash and other nonspecific skin eruption: Secondary | ICD-10-CM

## 2018-02-07 MED ORDER — CLINDAMYCIN PHOSPHATE 1 % EX GEL: Freq: Two times a day (BID) | CUTANEOUS | 6 refills | Status: DC

## 2018-02-07 MED ORDER — DOXYCYCLINE HYCLATE 100 MG PO TABS: 100.0000 mg | ORAL_TABLET | Freq: Two times a day (BID) | ORAL | 3 refills | Status: DC

## 2018-02-08 ENCOUNTER — Other Ambulatory Visit (HOSPITAL_COMMUNITY): Payer: Self-pay | Admitting: *Deleted

## 2018-02-08 ENCOUNTER — Other Ambulatory Visit (HOSPITAL_COMMUNITY): Payer: Self-pay | Admitting: Nurse Practitioner

## 2018-02-08 DIAGNOSIS — R21 Rash and other nonspecific skin eruption: Secondary | ICD-10-CM

## 2018-02-08 MED ORDER — TRIAMCINOLONE ACETONIDE 0.5 % EX OINT: 1.0000 "application " | TOPICAL_OINTMENT | Freq: Two times a day (BID) | CUTANEOUS | 2 refills | Status: DC

## 2018-02-08 MED ORDER — FLUOCINONIDE-E 0.05 % EX CREA: 1.0000 "application " | TOPICAL_CREAM | Freq: Two times a day (BID) | CUTANEOUS | 0 refills | Status: DC

## 2018-02-08 MED ORDER — CLINDAMYCIN PHOS-BENZOYL PEROX 1-5 % EX GEL: Freq: Two times a day (BID) | CUTANEOUS | 2 refills | Status: DC

## 2018-02-11 ENCOUNTER — Inpatient Hospital Stay (HOSPITAL_COMMUNITY): Payer: Medicaid Other | Attending: Hematology

## 2018-02-11 ENCOUNTER — Inpatient Hospital Stay (HOSPITAL_BASED_OUTPATIENT_CLINIC_OR_DEPARTMENT_OTHER): Payer: Medicaid Other | Admitting: Internal Medicine

## 2018-02-11 ENCOUNTER — Other Ambulatory Visit (HOSPITAL_COMMUNITY): Payer: Medicaid Other

## 2018-02-11 VITALS — BP 162/78 | HR 89 | Temp 98.2°F | Resp 16 | Wt 240.1 lb

## 2018-02-11 DIAGNOSIS — R918 Other nonspecific abnormal finding of lung field: Secondary | ICD-10-CM | POA: Insufficient documentation

## 2018-02-11 DIAGNOSIS — C772 Secondary and unspecified malignant neoplasm of intra-abdominal lymph nodes: Secondary | ICD-10-CM

## 2018-02-11 DIAGNOSIS — Z5111 Encounter for antineoplastic chemotherapy: Secondary | ICD-10-CM | POA: Insufficient documentation

## 2018-02-11 DIAGNOSIS — E538 Deficiency of other specified B group vitamins: Secondary | ICD-10-CM | POA: Insufficient documentation

## 2018-02-11 DIAGNOSIS — Z87891 Personal history of nicotine dependence: Secondary | ICD-10-CM

## 2018-02-11 DIAGNOSIS — R911 Solitary pulmonary nodule: Secondary | ICD-10-CM

## 2018-02-11 DIAGNOSIS — C188 Malignant neoplasm of overlapping sites of colon: Secondary | ICD-10-CM | POA: Insufficient documentation

## 2018-02-11 DIAGNOSIS — G620 Drug-induced polyneuropathy: Secondary | ICD-10-CM | POA: Insufficient documentation

## 2018-02-11 DIAGNOSIS — R21 Rash and other nonspecific skin eruption: Secondary | ICD-10-CM

## 2018-02-11 DIAGNOSIS — I10 Essential (primary) hypertension: Secondary | ICD-10-CM | POA: Diagnosis not present

## 2018-02-11 DIAGNOSIS — C189 Malignant neoplasm of colon, unspecified: Secondary | ICD-10-CM

## 2018-02-11 DIAGNOSIS — C184 Malignant neoplasm of transverse colon: Secondary | ICD-10-CM

## 2018-02-11 DIAGNOSIS — T451X5A Adverse effect of antineoplastic and immunosuppressive drugs, initial encounter: Secondary | ICD-10-CM

## 2018-02-11 LAB — COMPREHENSIVE METABOLIC PANEL
ALK PHOS: 100 U/L (ref 38–126)
ALT: 18 U/L (ref 0–44)
AST: 17 U/L (ref 15–41)
Albumin: 3.2 g/dL — ABNORMAL LOW (ref 3.5–5.0)
Anion gap: 7 (ref 5–15)
BUN: 14 mg/dL (ref 6–20)
CALCIUM: 9 mg/dL (ref 8.9–10.3)
CO2: 27 mmol/L (ref 22–32)
Chloride: 104 mmol/L (ref 98–111)
Creatinine, Ser: 0.89 mg/dL (ref 0.61–1.24)
GFR calc Af Amer: >60 mL/min (ref >60)
GFR calc non Af Amer: >60 mL/min (ref >60)
Glucose, Bld: 100 mg/dL — ABNORMAL HIGH (ref 70–99)
Potassium: 4.6 mmol/L (ref 3.5–5.1)
Sodium: 138 mmol/L (ref 135–145)
TOTAL PROTEIN: 6.4 g/dL — AB (ref 6.5–8.1)
Total Bilirubin: 0.2 mg/dL — ABNORMAL LOW (ref 0.3–1.2)

## 2018-02-11 LAB — CBC WITH DIFFERENTIAL/PLATELET
Abs Immature Granulocytes: 0.2 10*3/uL — ABNORMAL HIGH (ref 0.00–0.07)
Basophils Absolute: 0.1 10*3/uL (ref 0.0–0.1)
Basophils Relative: 1 %
Eosinophils Absolute: 0.2 10*3/uL (ref 0.0–0.5)
Eosinophils Relative: 2 %
HCT: 38.3 % — ABNORMAL LOW (ref 39.0–52.0)
Hemoglobin: 12.2 g/dL — ABNORMAL LOW (ref 13.0–17.0)
Immature Granulocytes: 3 %
LYMPHS ABS: 1.6 10*3/uL (ref 0.7–4.0)
Lymphocytes Relative: 22 %
MCH: 30.1 pg (ref 26.0–34.0)
MCHC: 31.9 g/dL (ref 30.0–36.0)
MCV: 94.6 fL (ref 80.0–100.0)
MONO ABS: 0.8 10*3/uL (ref 0.1–1.0)
Monocytes Relative: 11 %
Neutro Abs: 4.3 10*3/uL (ref 1.7–7.7)
Neutrophils Relative %: 61 %
Platelets: 333 10*3/uL (ref 150–400)
RBC: 4.05 MIL/uL — ABNORMAL LOW (ref 4.22–5.81)
RDW: 13.6 % (ref 11.5–15.5)
WBC: 7.1 10*3/uL (ref 4.0–10.5)
nRBC: 0 % (ref 0.0–0.2)

## 2018-02-11 LAB — MAGNESIUM: Magnesium: 1.9 mg/dL (ref 1.7–2.4)

## 2018-02-11 MED ORDER — SODIUM CHLORIDE 0.9 % IV SOLN
Freq: Once | INTRAVENOUS | Status: AC
  Administered 2018-02-11: 12:00:00 via INTRAVENOUS

## 2018-02-11 MED ORDER — SODIUM CHLORIDE 0.9 % IV SOLN
10.0000 mg | Freq: Once | INTRAVENOUS | Status: AC
  Administered 2018-02-11: 10 mg via INTRAVENOUS
  Filled 2018-02-11: qty 1

## 2018-02-11 MED ORDER — IRINOTECAN HCL CHEMO INJECTION 100 MG/5ML
180.0000 mg/m2 | Freq: Once | INTRAVENOUS | Status: AC
  Administered 2018-02-11: 420 mg via INTRAVENOUS
  Filled 2018-02-11: qty 15

## 2018-02-11 MED ORDER — SODIUM CHLORIDE 0.9% FLUSH
10.0000 mL | INTRAVENOUS | Status: DC | PRN
  Administered 2018-02-11: 10 mL
  Filled 2018-02-11: qty 10

## 2018-02-11 MED ORDER — FLUOROURACIL CHEMO INJECTION 2.5 GM/50ML
400.0000 mg/m2 | Freq: Once | INTRAVENOUS | Status: AC
  Administered 2018-02-11: 950 mg via INTRAVENOUS
  Filled 2018-02-11: qty 19

## 2018-02-11 MED ORDER — LEUCOVORIN CALCIUM INJECTION 350 MG
900.0000 mg | Freq: Once | INTRAVENOUS | Status: AC
  Administered 2018-02-11: 900 mg via INTRAVENOUS
  Filled 2018-02-11: qty 35

## 2018-02-11 MED ORDER — SODIUM CHLORIDE 0.9 % IV SOLN
2400.0000 mg/m2 | INTRAVENOUS | Status: DC
  Administered 2018-02-11: 5650 mg via INTRAVENOUS
  Filled 2018-02-11: qty 113

## 2018-02-11 MED ORDER — ATROPINE SULFATE 1 MG/ML IJ SOLN
0.5000 mg | Freq: Once | INTRAMUSCULAR | Status: AC | PRN
  Administered 2018-02-11: 0.5 mg via INTRAVENOUS

## 2018-02-11 MED ORDER — PALONOSETRON HCL INJECTION 0.25 MG/5ML
0.2500 mg | Freq: Once | INTRAVENOUS | Status: AC
  Administered 2018-02-11: 0.25 mg via INTRAVENOUS

## 2018-02-11 NOTE — Progress Notes (Signed)
Diagnosis No diagnosis found.  Staging Cancer Staging Malignant neoplasm of transverse colon [facility]) Staging form: Colon and Rectum, AJCC 7th Edition - Pathologic stage from 03/04/2015: Stage IIIC (T4a, N2a, cM0) - Signed by Baird Cancer, PA-C on 03/04/2015 - Pathologic: M1 - Signed by Zoila Shutter, MD on 01/23/2018   Assessment and Plan:   1. Stage IV CRC.  Pt was initially diagnosed with Stage IIIC adenocarcinoma of transverse colon.  63 yr old male previously followed by Dr. Talbert Cage.  He was diagnosed in 01/2015 on screening colonoscopy.  CEA elevated at time of diagnosis at 6.6. Treated with definitve surgery with right hemicolectomy by Dr. Adonis Huguenin on 02/12/15. Went on to have adjuvant chemo with FOLFOX x 12 cycles; chemo course was complicated by progressive peripheral neuropathy and thus Oxaliplatin was dose-reduced for cycles #6-11, and then d/c'd for cycle #12.   Pt had colonoscopy in 01/2016 with Dr. Oneida Alar. Path revealed 1 hyperplastic polyp.  No evidence of malignancy.    Pt was recommended for CT imaging but has not had that performed due to inability to get scan approved.    CT chest, abdomen and pelvis was recommended as his last imaging was in 10/2016 for ongoing assessment due to symptoms.    Pt had CT CAP done 10/23/2017 showed  IMPRESSION: 1. Subtle but new nodularity along the upper omentum along with nodular density in the pelvic mesentery adjacent to the sigmoid colon, concerning for peritoneal and omental implants of tumor. 2. Mild wall thickening in the sigmoid colon could reflect a low-grade colitis. 3. Trace perihepatic ascites. 4. Other imaging findings of potential clinical significance: Aortic Atherosclerosis (ICD10-I70.0) and Emphysema (ICD10-J43.9). Coronary atherosclerosis. Airway thickening is present, suggesting bronchitis or reactive airways disease. Degenerative right glenohumeral arthropathy with free osteochondral fragment in the subscapular recess.  Nonobstructive single bilateral punctate renal calculi. Prior right hemicolectomy. Mild prostatomegaly. Mild impingement at L4-5 and L5-S1.  Pt was recommended for PET scan which was done on 11/19/2017 that showed  IMPRESSION: 1. Similar to 10/23/2017 is progressive peritoneal nodularity worrisome for peritoneal carcinomatosis. This appears new when compared with 10/19/2016. 2. Increase in size of right paratracheal lymph node with mild FDG uptake. Nonspecific. There is also mild nonspecific increased uptake within the right hilar region. 3. Reticulonodular densities identified within both lungs with an upper lobe predominance are favored to represent postinflammatory changes. 4. Aortic Atherosclerosis (ICD10-I70.0) and Emphysema (ICD10-J43.9). 5. Coronary artery atherosclerotic calcifications.  Labs done 11/16/2017 reviewed and showed WBC 8.5 HB 14.8 and plts 192,000.  Chemistries WNL with K+ 3.8 Cr 0.84 and normal LFTs  CEA elevated at 12.9.  Previously, Scan discussed with Dr. Oneida Alar and IR.  Dr. Markus Daft  reviewed CT scan and pt was referred to IR for review for possible CT biopsy of omental lesions.   Biopsy was attempted on 11/30/2017 but was unsuccessful.    Pt was referred to Dr. Arnoldo Morale of surgery and underwent peritoneal biopsy of implants on 12/19/2017 with pathology returning as adenocarcinoma consistent with prior CRC.    Previously, I have discussed with pt multicenter trials have shown benefit of standard regimen of FolFIRI  in pts who have progressed after prior exposure to Oxaliplatin.  Goals of therapy discussed which is disease control.  Pt will have repeat imaging after 3 months of therapy.    Pt has completed C1 of Folfiri.  Foundation testing reviewed showed evidence of wild type KRAS.  Previously I discussed randomized trial of vectibix plus FOLFIRI versus FOLFIRI  alone after failure of initial FU-containing chemotherapy (two-thirds prior oxaliplatin, 20 percent prior  bevacizumab) showed that, in the KRAS wild-type group (n = 597), the addition of panitumumab was associated with a significant improvement in response rate (35 versus 10 percent) and median PFS (5.9 versus 3.9 months).  Overall regimen is well tolerated other than known toxicities associated with anti- EGFR therapy.    Vectibix is dosed at 6 mg/kg IV every 2 weeks with FolFiri with 3f dosed at 400 mg/m2 with 5FU 2400 mg/m2 CIVI over 46 hours, Leucovorin 400 mg/m2, Irinotecan 180 mg/m2.  Treatment will be every 2 weeks.   Labs done 02/11/2018 reviewed and showed WBC 7.1 HB 12.2 plts 333,000.  Chemistries WNL with K+ 4.5 Cr 0.89 and normal LFTs.    Pt has rash that is consistent with Vectibix therapy.  He was previously recommended for Doxycycline 100 mg po bid, Clindamycin gel and hydrocortisone cream.  He reports clindamycin gel causes burning and does not want to use it.  He is advised to use acne face wash to face and chest area, and to continue doxycycline and HC cream.  Will hold Vectibix today and reevaluate in 2 weeks for improvement with previously recommended therapy.  He will proceed with Folfiri alone today.  Labs adequate for therapy.    2.  Rash. Findings consistent with Vectibix therapy.  He was previously recommended for Doxycycline 100 mg po bid, Clindamycin gel and hydrocortisone cream.  He reports clindamycin gel causes burning and does not want to use it.  He is advised to use acne face wash to face and chest area, and to continue doxycycline and HC cream.  Will hold Vectibix today and reevaluate in 2 weeks for improvement with previously recommended therapy.   3.  LUL pulmonary nodule/ Right paratracheal LN.  Pt had 6 mm nodule on scan done 04/2016.  CT chest done 10/23/2017 showed stable nodule.  Pet scan done 11/19/2017 shows paratracheal LN is nonspecific and measures 9 mm.  Pt will have repeat imaging after 3 months of therapy.    4  HTN.  BP is 140/74.  . Follow-up with PCP.    5.   Smoking.  Cessation is recommended.    6.  Neuropathy.  Continue neurontin.    7.  Bedbugs.  This was reported previously by nursing.  Pt reports he had therapy to apartment done.  Pt advised to keep rash clean as any insect bites may worsen symptoms.    25 minutes spent with more than 50% spent in counseling and coordination of care.    Interval History:  From TKirby Crigler PA-C's last note on 06/14/16)    Current Status:  Pt is seen today for follow-up prior to C3 of Folfiri and C2 of Eribitux.  He reports clindamycin gel causes burning to rash.    Oncology History   Stage IIIC ([facility] adenocarcinoma of transverse colon, diagnosed on colonoscopy by Dr. FOneida Alaron 01/12/2015 followed by definitive surgery by Dr. CClayburn Pertwith right hemicolectomy on 02/12/2015.  He then underwent FOLFOX x 12 cycles in the adjuvant setting (03/30/2015- 09/06/2015).     Malignant neoplasm of transverse colon ([facility]   01/12/2015 Pathologic Stage    Colon, biopsy, distal transverse - TUBULOVILLOUS ADENOMA WITH HIGH GRADE DYSPLASIA.    01/12/2015 Procedure    Colonoscopy by Dr. FOneida Alar    01/12/2015 Tumor Marker    CEA: 6.6 (H)     01/18/2015 Imaging    CT abd/pelvis- Apple-core  lesion identified in the mid transverse colon without obstruction. No evidence for lymphadenopathy in the gastrohepatic ligament or omentum.  Stable 8 mm hypo attenuating lesion in the left liver, likely a cyst.    02/10/2015 Initial Diagnosis    Adenocarcinoma of transverse colon ([place])    02/12/2015 Definitive Surgery    [name], Extended right hemicolectomy     02/12/2015 Pathology Results    Mucinous adenocarcinoma with penetration of visceral peritoneum, 4/19 lymph nodes for metastatic disease, negative resection margins, with LVI and perineural invasion    03/30/2015 - 09/06/2015 Chemotherapy    FOLFOX x 12 cycles    05/25/2015 Treatment Plan Change    5 FU bolus discontinued for cycle #5    06/08/2015 Treatment  Plan Change    Treatment deferred x 1 week    06/15/2015 Treatment Plan Change    5FU CI decreased by 10% and Oxaliplatin reduced by 15% for cycles #6-#11; Oxaliplatin dropped for cycle #12 d/t neuropathy.     10/20/2015 Imaging    CT CAP- Right hemicolectomy without evidence of metastatic disease. 2. Previously measured ground-glass lesion in the left upper lobe has resolved. 3. Probable food debris in the stomach, simulating gastric wall thickening. Please correlate clinically. 4. 6 mm irregular nodular density in the left upper lobe, stable. Continued attention on followup exams is warranted.    01/25/2016 Procedure    Colonoscopy by Dr. Oneida Alar- Non-thrombosed external hemorrhoids found on digital rectal exam. - One 4 mm polyp in the rectum, removed with a cold biopsy forceps. Resected and retrieved. - Congested mucosa in the neo-terminal ileum. Biopsied. - Redundant colon. - Internal hemorrhoids.    01/26/2016 Pathology Results    1. Terminal ileum, biopsy - MILD ACUTE (ACTIVE) ILEITIS. - NO DYSPLASIA OR MALIGNANCY IDENTIFIED - SEE COMMENT. 2. Rectum, polyp(s) - HYPERPLASTIC POLYP (X 1). - NO DYSPLASIA OR MALIGNANCY IDENTIFIED.    04/13/2016 Imaging    CT chest- Stable CT chest. 6 mm irregular nodule anterior left upper lobe is stable. The scattered areas of peribronchovascular micro nodularity in the lungs bilaterally are unchanged.    10/19/2016 Imaging    CT CAP: 1. Status post right hemicolectomy. No findings to suggest metastatic disease in the abdomen or pelvis. 2. Aortic atherosclerosis. 3. Additional incidental findings, as above. Aortic Atherosclerosis (ICD10-I70.0).    01/09/2018 -  Chemotherapy    The patient had palonosetron (ALOXI) injection 0.25 mg, 0.25 mg, Intravenous,  Once, 3 of 4 cycles Administration: 0.25 mg (01/09/2018), 0.25 mg (01/23/2018), 0.25 mg (02/11/2018) irinotecan (CAMPTOSAR) 420 mg in dextrose 5 % 500 mL chemo infusion, 180 mg/m2 = 420 mg,  Intravenous,  Once, 3 of 4 cycles Administration: 420 mg (01/09/2018), 420 mg (01/23/2018) leucovorin 900 mg in dextrose 5 % 250 mL infusion, 944 mg, Intravenous,  Once, 3 of 4 cycles Administration: 900 mg (01/09/2018), 900 mg (01/23/2018) fluorouracil (ADRUCIL) chemo injection 950 mg, 400 mg/m2 = 950 mg, Intravenous,  Once, 3 of 4 cycles Administration: 950 mg (01/09/2018), 950 mg (01/23/2018) fluorouracil (ADRUCIL) 5,650 mg in sodium chloride 0.9 % 137 mL chemo infusion, 2,400 mg/m2 = 5,650 mg, Intravenous, 1 Day/Dose, 3 of 4 cycles Administration: 5,650 mg (01/09/2018), 5,650 mg (01/23/2018)  for chemotherapy treatment.     01/23/2018 Cancer Staging    Staging form: Colon and Rectum, AJCC 7th Edition - Pathologic: M1 - Signed by Zoila Shutter, MD on 01/23/2018     Malignant neoplasm of overlapping sites of colon ([place])   12/26/2017 Initial Diagnosis  Cancer of overlapping sites of colon metastatic to intra-abdominal lymph node ([place])    01/09/2018 -  Chemotherapy    The patient had palonosetron (ALOXI) injection 0.25 mg, 0.25 mg, Intravenous,  Once, 3 of 4 cycles Administration: 0.25 mg (01/09/2018), 0.25 mg (01/23/2018), 0.25 mg (02/11/2018) irinotecan (CAMPTOSAR) 420 mg in dextrose 5 % 500 mL chemo infusion, 180 mg/m2 = 420 mg, Intravenous,  Once, 3 of 4 cycles Administration: 420 mg (01/09/2018), 420 mg (01/23/2018) leucovorin 900 mg in dextrose 5 % 250 mL infusion, 944 mg, Intravenous,  Once, 3 of 4 cycles Administration: 900 mg (01/09/2018), 900 mg (01/23/2018) fluorouracil (ADRUCIL) chemo injection 950 mg, 400 mg/m2 = 950 mg, Intravenous,  Once, 3 of 4 cycles Administration: 950 mg (01/09/2018), 950 mg (01/23/2018) fluorouracil (ADRUCIL) 5,650 mg in sodium chloride 0.9 % 137 mL chemo infusion, 2,400 mg/m2 = 5,650 mg, Intravenous, 1 Day/Dose, 3 of 4 cycles Administration: 5,650 mg (01/09/2018), 5,650 mg (01/23/2018)  for chemotherapy treatment.      Colon cancer metastasized to multiple  sites [facility])   01/16/2018 Initial Diagnosis    Colon cancer metastasized to multiple sites [facility])    01/23/2018 -  Chemotherapy    The patient had panitumumab (VECTIBIX) 600 mg in sodium chloride 0.9 % 100 mL chemo infusion, 640 mg, Intravenous,  Once, 1 of 3 cycles Administration: 600 mg (01/23/2018)  for chemotherapy treatment.       Problem List Patient Active Problem List   Diagnosis Date Noted  . Colon cancer metastasized to multiple sites [facility]) [C18.9] 01/16/2018  . Malignant neoplasm of overlapping sites of colon ([place]) [C18.8] 12/26/2017  . S/P exploratory laparotomy [Z98.890] 12/19/2017  . Carcinomatosis ([place]) [C80.0]   . Psoriasis [L40.9] 09/12/2016  . Pulmonary nodule [R91.1] 06/14/2016  . History of colon cancer [Z85.038]   . Diarrhea [R19.7] 01/06/2016  . Cigarette nicotine dependence with nicotine-induced disorder [F17.219] 06/18/2015  . Chronic obstructive pulmonary disease ([place]) [J44.9] 06/18/2015  . Depression [F32.9] 06/18/2015  . B12 deficiency [E53.8] 04/14/2015  . Malignant neoplasm of transverse colon ([place]) [C18.4] 02/10/2015  . GERD (gastroesophageal reflux disease) [K21.9] 12/15/2014  . History of colonic polyps [Z86.010] 12/15/2014  . History of alcohol abuse [F10.11] 09/16/2014  . Panic disorder [F41.0] 10/10/2012  . Major depressive disorder, single episode [F32.9] 10/10/2012  . Bilateral lower extremity edema [R60.0] 04/24/2012  . Hyperlipidemia [E78.5] 04/21/2009  . CAD, NATIVE VESSEL [I25.10] 03/25/2009  . HEAD TRAUMA [S09.90XA] 03/02/2009    Past Medical History Past Medical History:  Diagnosis Date  . Abnormal stress echocardiogram   . Adenocarcinoma of transverse colon ([place]) 02/10/2015  . Alcohol abuse    Heavy Use up until 2010  . Anxiety   . Blood transfusion without reported diagnosis   . Cancer of overlapping sites of colon metastatic to intra-abdominal lymph node ([place]) 12/26/2017  . COPD (chronic obstructive pulmonary disease) ([place])     . Depression   . Emphysema of lung ([place])   . GERD (gastroesophageal reflux disease)   . Head trauma 2001   closed head injury; coma for 4 weeks  . Hypercholesterolemia   . Hypertension   . ING HERN W/GANGREN RECUR UNILAT/UNSPEC ING HERN 04/21/2009   Qualifier: Diagnosis of  By: Verl Blalock, MD, Delanna Ahmadi PTSD (post-traumatic stress disorder)   . Pulmonary nodule 06/14/2016  . SAH (subarachnoid hemorrhage) ([place]) 08/31/2012  . SDH (subdural hematoma) ([place]) 08/31/2012    Past Surgical History Past Surgical History:  Procedure Laterality Date  .  BIOPSY  01/25/2016   Procedure: BIOPSY;  Surgeon: Danie Binder, MD;  [facility];  Service: Endoscopy;;  ileum;   . cardiac cath    . COLONOSCOPY  2011   Dr. Oneida Alar: multiple adenomas and hyperplastic polyps  . COLONOSCOPY WITH PROPOFOL N/A 01/12/2015   Procedure: COLONOSCOPY WITH PROPOFOL;  Surgeon: Danie Binder, MD;  Location: [facility];  Service: Endoscopy;  Laterality: N/A;  1030  . COLONOSCOPY WITH PROPOFOL N/A 01/25/2016   Procedure: COLONOSCOPY WITH PROPOFOL;  Surgeon: Danie Binder, MD;  Location: [facility];  Service: Endoscopy;  Laterality: N/A;  1230  . CRANIOTOMY  2001  . ESOPHAGOGASTRODUODENOSCOPY (EGD) WITH PROPOFOL N/A 01/12/2015   Procedure: ESOPHAGOGASTRODUODENOSCOPY (EGD) WITH PROPOFOL;  Surgeon: Danie Binder, MD;  [facility];  Service: Endoscopy;  Laterality: N/A;  . head injury surgery    . HERNIA REPAIR Right 2012   Inguinal- [place] Na  . KIDNEY SURGERY     >30 years ago  . LAPAROSCOPIC RIGHT HEMI COLECTOMY Left 02/10/2015   Procedure: LAPAROSCOPIC THEN OPEN RIGHT HEMI COLECTOMY;  Surgeon: [name], MD;  Location: [facility] ORS;  Service: General;  Laterality: Left;  . LAPAROTOMY N/A 12/19/2017   Procedure: EXPLORATORY LAPAROTOMY;  Surgeon: Aviva Signs, MD;  Location: [facility] OR[facility];  Service: General;  Laterality: N/A;  . POLYPECTOMY  01/25/2016   Procedure: POLYPECTOMY;  Surgeon: Danie Binder, MD;  [facility];  Service: Endoscopy;;  colon  . PORT-A-CATH REMOVAL N/A 02/26/2017   Procedure: MINOR REMOVAL PORT-A-CATH;  Surgeon: Aviva Signs, MD;  Location: [facility] ORS;  Service: General;  Laterality: N/A;  Pt to arrive at [place] N/A 03/24/2015   Procedure: INSERTION PORT-A-CATH;  Surgeon: Jules Husbands, MD;  Location: [facility] ORS;  Service: General;  Laterality: N/A;  . PORTACATH PLACEMENT Left 01/07/2018   Procedure: INSERTION PORT A CATH (ATTACHED CATHETER IN LEFT SUBCLAVIAN);  Surgeon: Aviva Signs, MD;  Location: [facility];  Service: General;  Laterality: Left;    Family History Family History  Problem Relation Age of Onset  . Pulmonary embolism Mother   . Breast cancer Mother   . Cancer Mother        breast cancer  . Arthritis Mother   . Heart disease Father   . Cancer Father 62       Leukemia  . Cancer Paternal Grandfather        Lung  . Ataxia Neg Hx   . Chorea Neg Hx   . Dementia Neg Hx   . Mental retardation Neg Hx   . Migraines Neg Hx   . Multiple sclerosis Neg Hx   . Neurofibromatosis Neg Hx   . Neuropathy Neg Hx   . Parkinsonism Neg Hx   . Seizures Neg Hx   . Stroke Neg Hx   . Colon cancer Neg Hx      Social History  reports that he quit smoking about 2 months ago. His smoking use included cigarettes. He started smoking about 45 years ago. He has a 3.00 pack-year smoking history. He has never used smokeless tobacco. He reports current alcohol use. He reports that he does not use drugs.  Medications  Current Outpatient Medications:  .  amitriptyline (ELAVIL) 25 MG tablet, TAKE 1 TABLET BY MOUTH EVERY NIGHT AT BEDTIME (Patient taking differently: Take 25 mg by mouth at bedtime as needed for sleep. ), Disp: 30 tablet, Rfl: 0 .  atorvastatin (LIPITOR)  20 MG tablet, Take 1 tablet (20 mg total) by mouth at bedtime. (Patient taking differently: Take 20 mg by mouth every morning. ), Disp: 90 tablet, Rfl: 3 .  buPROPion  (WELLBUTRIN SR) 150 MG 12 hr tablet, Take 150 mg by mouth every morning. , Disp: , Rfl:  .  busPIRone (BUSPAR) 10 MG tablet, Take 10 mg by mouth 2 (two) times daily., Disp: , Rfl:  .  clindamycin-benzoyl peroxide (BENZACLIN) gel, Apply topically 2 (two) times daily., Disp: 50 g, Rfl: 2 .  dexamethasone (DECADRON) 4 MG tablet, Take 2 tablets (8 mg total) by mouth daily. Start the day after chemo for 2 days., Disp: 8 tablet, Rfl: 5 .  doxycycline (VIBRA-TABS) 100 MG tablet, Take 1 tablet (100 mg total) by mouth 2 (two) times daily., Disp: 60 tablet, Rfl: 3 .  fluorouracil CALGB 70177 in sodium chloride 0.9 % 150 mL, Inject 5,650 mg into the vein. Over 46 hours, Disp: , Rfl:  .  FLUoxetine (PROZAC) 20 MG capsule, TAKE 1 CAPSULE BY MOUTH 2 TIMES A DAY (Patient taking differently: Take 20 mg by mouth 2 (two) times daily. ), Disp: 60 capsule, Rfl: 0 .  gabapentin (NEURONTIN) 300 MG capsule, TAKE 1 CAPSULE BY MOUTH IN THE MORNING AND 3 CAPSULES BY MOUTH AT BEDTIME, Disp: 120 capsule, Rfl: 0 .  hydrocortisone 1 % ointment, Apply 1 application topically 2 (two) times daily., Disp: 30 g, Rfl: 0 .  IRINOTECAN HCL IV, Inject 420 mg into the vein every 14 (fourteen) days., Disp: , Rfl:  .  LEUCOVORIN CALCIUM IV, Inject 944 mg into the vein every 14 (fourteen) days., Disp: , Rfl:  .  lidocaine (XYLOCAINE) 2 % solution, Use as directed 15 mLs in the mouth or throat every 4 (four) hours as needed for mouth pain (as an aid to eat and drink)., Disp: 240 mL, Rfl: 0 .  lidocaine-prilocaine (EMLA) cream, APPLY A SMALL AMOUNT OVER PORT SITE AND COVER WITH PLASTIC WRAP ONE HOUR PRIOR TO APPOINTMENT, Disp: 30 g, Rfl: 0 .  loperamide (IMODIUM) 2 MG capsule, TAKE 1 TO 2 CAPSULES BY MOUTH EVERY 6 HOURS AS NEEDED FOR DIARRHEA OR LOOSE STOOLS, Disp: 60 capsule, Rfl: 0 .  omeprazole (PRILOSEC) 20 MG capsule, Take 20 mg by mouth every morning. , Disp: , Rfl:  .  ondansetron (ZOFRAN) 8 MG tablet, Take 1 tablet (8 mg total) by mouth  2 (two) times daily as needed for refractory nausea / vomiting. Start on day 3 after chemotherapy., Disp: 30 tablet, Rfl: 1 .  oxyCODONE-acetaminophen (PERCOCET) 7.5-325 MG tablet, Take 1 tablet by mouth every 6 (six) hours as needed. (Patient not taking: Reported on 02/02/2018), Disp: 20 tablet, Rfl: 0 .  Panitumumab (VECTIBIX IV), Inject 600 mg into the vein every 14 (fourteen) days., Disp: , Rfl:  .  PROAIR HFA 108 (90 Base) MCG/ACT inhaler, INHALE 2 PUFFS BY MOUTH EVERY 6 HOURS AS NEEDED FOR SHORTNESS OF BREATH/WHEEZING. (Patient taking differently: Inhale 2 puffs into the lungs every 6 (six) hours as needed for wheezing or shortness of breath. ), Disp: 18 each, Rfl: 2 .  prochlorperazine (COMPAZINE) 10 MG tablet, Take 1 tablet (10 mg total) by mouth every 6 (six) hours as needed (NAUSEA)., Disp: 30 tablet, Rfl: 1 .  triamcinolone ointment (KENALOG) 0.5 %, Apply 1 application topically 2 (two) times daily., Disp: 60 g, Rfl: 2 No current facility-administered medications for this visit.   Facility-Administered Medications Ordered in Other Visits:  .  fluorouracil (  ADRUCIL) 5,650 mg in sodium chloride 0.9 % 137 mL chemo infusion, 2,400 mg/m2 (Treatment Plan Recorded), Intravenous, 1 day or 1 dose, Shaunae Sieloff, MD .  fluorouracil (ADRUCIL) chemo injection 950 mg, 400 mg/m2 (Treatment Plan Recorded), Intravenous, Once, Kenzy Campoverde, MD .  irinotecan (CAMPTOSAR) 420 mg in dextrose 5 % 500 mL chemo infusion, 180 mg/m2 (Treatment Plan Recorded), Intravenous, Once, Nachmen Mansel, MD, Last Rate: 347 mL/hr at 02/11/18 1322, 420 mg at 02/11/18 1322 .  leucovorin 900 mg in dextrose 5 % 250 mL infusion, 900 mg, Intravenous, Once, Ailyn Gladd, MD, Last Rate: 197 mL/hr at 02/11/18 1320, 900 mg at 02/11/18 1320 .  sodium chloride flush (NS) 0.9 % injection 10 mL, 10 mL, Intracatheter, PRN, Baruc Tugwell, MD, 10 mL at 02/11/18 1125  Allergies Patient has no known allergies.  Review of Systems Review of Systems  - Oncology ROS negative other than rash.     Physical Exam  Vitals Wt Readings from Last 3 Encounters:  02/11/18 240 lb 1.6 oz (108.9 kg)  02/02/18 240 lb (108.9 kg)  01/23/18 234 lb 6.4 oz (106.3 kg)   Temp Readings from Last 3 Encounters:  02/11/18 99.7 F (37.6 C) (Oral)  02/02/18 98.2 F (36.8 C) (Oral)  01/25/18 98.2 F (36.8 C) (Oral)   BP Readings from Last 3 Encounters:  02/11/18 140/74  02/02/18 118/79  01/25/18 117/66   Pulse Readings from Last 3 Encounters:  02/11/18 98  02/02/18 98  01/25/18 90    Constitutional: Well-developed, well-nourished, and in no distress.   HENT: Head: Normocephalic and atraumatic.  Mouth/Throat: No oropharyngeal exudate. Mucosa moist. Eyes: Pupils are equal, round, and reactive to light. Conjunctivae are normal. No scleral icterus.  Neck: Normal range of motion. Neck supple. No JVD present.  Cardiovascular: Normal rate, regular rhythm and normal heart sounds.  Exam reveals no gallop and no friction rub.   No murmur heard. Pulmonary/Chest: Effort normal and breath sounds normal. No respiratory distress. No wheezes.No rales.  Abdominal: Soft. Bowel sounds are normal. No distension. There is no tenderness. There is no guarding.  Musculoskeletal: No edema or tenderness.  Lymphadenopathy: No cervical, axillary or supraclavicular adenopathy.  Neurological: Alert and oriented to person, place, and time. No cranial nerve deficit.  Skin: Skin is warm and dry. Rash on chest and face consistent with Vectibix therapy.   Psychiatric: Affect and judgment normal.   Labs Appointment on 02/11/2018  Component Date Value Ref Range Status  . WBC 02/11/2018 7.1  4.0 - 10.5 K/uL Final  . RBC 02/11/2018 4.05* 4.22 - 5.81 MIL/uL Final  . Hemoglobin 02/11/2018 12.2* 13.0 - 17.0 g/dL Final  . HCT 02/11/2018 38.3* 39.0 - 52.0 % Final  . MCV 02/11/2018 94.6  80.0 - 100.0 fL Final  . MCH 02/11/2018 30.1  26.0 - 34.0 pg Final  . MCHC 02/11/2018 31.9   30.0 - 36.0 g/dL Final  . RDW 02/11/2018 13.6  11.5 - 15.5 % Final  . Platelets 02/11/2018 333  150 - 400 K/uL Final  . nRBC 02/11/2018 0.0  0.0 - 0.2 % Final  . Neutrophils Relative % 02/11/2018 61  % Final  . Neutro Abs 02/11/2018 4.3  1.7 - 7.7 K/uL Final  . Lymphocytes Relative 02/11/2018 22  % Final  . Lymphs Abs 02/11/2018 1.6  0.7 - 4.0 K/uL Final  . Monocytes Relative 02/11/2018 11  % Final  . Monocytes Absolute 02/11/2018 0.8  0.1 - 1.0 K/uL Final  . Eosinophils Relative 02/11/2018  2  % Final  . Eosinophils Absolute 02/11/2018 0.2  0.0 - 0.5 K/uL Final  . Basophils Relative 02/11/2018 1  % Final  . Basophils Absolute 02/11/2018 0.1  0.0 - 0.1 K/uL Final  . Immature Granulocytes 02/11/2018 3  % Final  . Abs Immature Granulocytes 02/11/2018 0.20* 0.00 - 0.07 K/uL Final   Performed at [facility], [address], [place], [place] [address]  . Sodium 02/11/2018 138  135 - 145 mmol/L Final  . Potassium 02/11/2018 4.6  3.5 - 5.1 mmol/L Final  . Chloride 02/11/2018 104  98 - 111 mmol/L Final  . CO2 02/11/2018 27  22 - 32 mmol/L Final  . Glucose, Bld 02/11/2018 100* 70 - 99 mg/dL Final  . BUN 02/11/2018 14  6 - 20 mg/dL Final  . Creatinine, Ser 02/11/2018 0.89  0.61 - 1.24 mg/dL Final  . Calcium 02/11/2018 9.0  8.9 - 10.3 mg/dL Final  . Total Protein 02/11/2018 6.4* 6.5 - 8.1 g/dL Final  . Albumin 02/11/2018 3.2* 3.5 - 5.0 g/dL Final  . AST 02/11/2018 17  15 - 41 U/L Final  . ALT 02/11/2018 18  0 - 44 U/L Final  . Alkaline Phosphatase 02/11/2018 100  38 - 126 U/L Final  . Total Bilirubin 02/11/2018 0.2* 0.3 - 1.2 mg/dL Final  . GFR calc non Af Amer 02/11/2018 >60  >60 mL/min Final  . GFR calc Af Amer 02/11/2018 >60  >60 mL/min Final  . Anion gap 02/11/2018 7  5 - 15 Final   Performed at [facility], [address] [place], [place] [address]  . Magnesium 02/11/2018 1.9  1.7 - 2.4 mg/dL Final   Performed at [facility], [address] [place], [place] [address]      Pathology No orders of the defined types were placed in this encounter.      Zoila Shutter MD

## 2018-02-11 NOTE — Patient Instructions (Signed)
[facility] Discharge Instructions for Patients Receiving Chemotherapy   Beginning January 23rd 2017 lab work for the Cancer Center will be done in the  Main lab at [facility] on 1st floor. If you have a lab appointment with the Cancer Center please come in thru the  Main Entrance and check in at the main information desk   Today you received the following chemotherapy agents   To help prevent nausea and vomiting after your treatment, we encourage you to take your nausea medication     If you develop nausea and vomiting, or diarrhea that is not controlled by your medication, call the clinic.  The clinic phone number is [phone]. Office hours are Monday-Friday 8:30am-5:00pm.  BELOW ARE SYMPTOMS THAT SHOULD BE REPORTED IMMEDIATELY:  *FEVER GREATER THAN 101.0 F  *CHILLS WITH OR WITHOUT FEVER  NAUSEA AND VOMITING THAT IS NOT CONTROLLED WITH YOUR NAUSEA MEDICATION  *UNUSUAL SHORTNESS OF BREATH  *UNUSUAL BRUISING OR BLEEDING  TENDERNESS IN MOUTH AND THROAT WITH OR WITHOUT PRESENCE OF ULCERS  *URINARY PROBLEMS  *BOWEL PROBLEMS  UNUSUAL RASH Items with * indicate a potential emergency and should be followed up as soon as possible. If you have an emergency after office hours please contact your primary care physician or go to the nearest emergency department.  Please call the clinic during office hours if you have any questions or concerns.   You may also contact the Patient Navigator at [phone] should you have any questions or need assistance in obtaining follow up care.      Resources For Cancer Patients and their Caregivers ? American Cancer Society: Can assist with transportation, wigs, general needs, runs Look Good Feel Better.        1-888-227-6333 ? Cancer Care: Provides financial assistance, online support groups, medication/co-pay assistance.  1-800-813-HOPE (4673) ? [facility] Assists [place] cancer  patients and their families through emotional , educational and financial support.  336-427-4357 ? [place] DSS Where to apply for food stamps, Medicaid and utility assistance. 336-342-1394 ? [facility]: Transportation to medical appointments. 336-347-2287 ? Social Security Administration: May apply for disability if have a Stage IV cancer. 336-342-7796 1-800-772-1213 ? [place] [facility]ging, Disability and Transit Services: Assists with nutrition, care and transit needs. 336-349-2343         

## 2018-02-11 NOTE — Progress Notes (Signed)
1145 lab work reviewed and patient seen by Dr. Walden Field. Patients acne like rash to his upper trunk, arms, neck, face, and head is much improved from last week. He has been using his creams as prescribed. He states it will still bleed if he touches it. Per Dr. Walden Field, it is ok to proceed with [facility] today and we will hold the Vectibix. Pt instructed to continue using creams for his rash. Verbalizes understanding.   Erik [name] tolerated Folfiri without incident or complaint. VSS upon completion of treatment. 5FU infusing through port via home infusion pump without difficulties. Dressing changed and reinforced with additional tape. Discharged self ambulatory in satisfactory condition.

## 2018-02-13 ENCOUNTER — Encounter (HOSPITAL_COMMUNITY): Payer: Self-pay

## 2018-02-13 ENCOUNTER — Inpatient Hospital Stay (HOSPITAL_COMMUNITY): Payer: Medicaid Other | Attending: Hematology

## 2018-02-13 VITALS — BP 140/81 | HR 110 | Temp 97.4°F | Resp 18

## 2018-02-13 DIAGNOSIS — Z5111 Encounter for antineoplastic chemotherapy: Secondary | ICD-10-CM | POA: Diagnosis not present

## 2018-02-13 DIAGNOSIS — C188 Malignant neoplasm of overlapping sites of colon: Secondary | ICD-10-CM | POA: Insufficient documentation

## 2018-02-13 DIAGNOSIS — C772 Secondary and unspecified malignant neoplasm of intra-abdominal lymph nodes: Secondary | ICD-10-CM | POA: Insufficient documentation

## 2018-02-13 DIAGNOSIS — Z452 Encounter for adjustment and management of vascular access device: Secondary | ICD-10-CM | POA: Insufficient documentation

## 2018-02-13 DIAGNOSIS — C184 Malignant neoplasm of transverse colon: Secondary | ICD-10-CM

## 2018-02-13 MED ORDER — SODIUM CHLORIDE 0.9% FLUSH
10.0000 mL | INTRAVENOUS | Status: DC | PRN
  Administered 2018-02-13: 10 mL
  Filled 2018-02-13: qty 10

## 2018-02-13 MED ORDER — HEPARIN SOD (PORK) LOCK FLUSH 100 UNIT/ML IV SOLN
500.0000 [IU] | Freq: Once | INTRAVENOUS | Status: AC | PRN
  Administered 2018-02-13: 500 [IU]

## 2018-02-13 NOTE — Progress Notes (Signed)
Erik [name] tolerated 5FU pump well without complaints or incident. 5FU pump discontinued with portacath flushed with 10 ml NS and 5 ml Heparin easily per protocol then de-accessed. VSS Pt discharged self ambulatory in satisfactory condition

## 2018-02-13 NOTE — Patient Instructions (Signed)
[facility] at [facility] Discharge Instructions  5FU pump discontinued with portacath flushed per protocol. Follow-up as scheduled. Call clinic for any questions or concerns   Thank you for choosing [facility] at [facility] to provide your oncology and hematology care.  To afford each patient quality time with our provider, please arrive at least 15 minutes before your scheduled appointment time.   If you have a lab appointment with the Cancer Center please come in thru the  Main Entrance and check in at the main information desk  You need to re-schedule your appointment should you arrive 10 or more minutes late.  We strive to give you quality time with our providers, and arriving late affects you and other patients whose appointments are after yours.  Also, if you no show three or more times for appointments you may be dismissed from the clinic at the providers discretion.     Again, thank you for choosing [facility].  Our hope is that these requests will decrease the amount of time that you wait before being seen by our physicians.       _____________________________________________________________  Should you have questions after your visit to  Cancer Center, please contact our office at [phone] between the hours of 8:00 a.m. and 4:30 p.m.  Voicemails left after 4:00 p.m. will not be returned until the following business day.  For prescription refill requests, have your pharmacy contact our office and allow 72 hours.    Cancer Center Support Programs:   > Cancer Support Group  2nd Tuesday of the month 1pm-2pm, Journey Room   

## 2018-02-25 ENCOUNTER — Inpatient Hospital Stay (HOSPITAL_COMMUNITY): Payer: Medicaid Other

## 2018-02-25 ENCOUNTER — Inpatient Hospital Stay (HOSPITAL_BASED_OUTPATIENT_CLINIC_OR_DEPARTMENT_OTHER): Payer: Medicaid Other | Admitting: Internal Medicine

## 2018-02-25 ENCOUNTER — Inpatient Hospital Stay (HOSPITAL_COMMUNITY): Admit: 2018-02-25 | Payer: Self-pay

## 2018-02-25 ENCOUNTER — Other Ambulatory Visit (HOSPITAL_COMMUNITY): Payer: Medicaid Other

## 2018-02-25 VITALS — BP 177/74 | HR 94 | Temp 98.6°F | Resp 18

## 2018-02-25 VITALS — BP 151/91 | HR 120 | Temp 98.4°F | Resp 18 | Wt 234.8 lb

## 2018-02-25 DIAGNOSIS — I10 Essential (primary) hypertension: Secondary | ICD-10-CM

## 2018-02-25 DIAGNOSIS — Z87891 Personal history of nicotine dependence: Secondary | ICD-10-CM

## 2018-02-25 DIAGNOSIS — C189 Malignant neoplasm of colon, unspecified: Secondary | ICD-10-CM

## 2018-02-25 DIAGNOSIS — C772 Secondary and unspecified malignant neoplasm of intra-abdominal lymph nodes: Secondary | ICD-10-CM | POA: Diagnosis not present

## 2018-02-25 DIAGNOSIS — C188 Malignant neoplasm of overlapping sites of colon: Secondary | ICD-10-CM

## 2018-02-25 DIAGNOSIS — G620 Drug-induced polyneuropathy: Secondary | ICD-10-CM

## 2018-02-25 DIAGNOSIS — R911 Solitary pulmonary nodule: Secondary | ICD-10-CM

## 2018-02-25 DIAGNOSIS — C184 Malignant neoplasm of transverse colon: Secondary | ICD-10-CM

## 2018-02-25 DIAGNOSIS — R21 Rash and other nonspecific skin eruption: Secondary | ICD-10-CM | POA: Diagnosis not present

## 2018-02-25 DIAGNOSIS — T451X5A Adverse effect of antineoplastic and immunosuppressive drugs, initial encounter: Secondary | ICD-10-CM

## 2018-02-25 DIAGNOSIS — E538 Deficiency of other specified B group vitamins: Secondary | ICD-10-CM

## 2018-02-25 DIAGNOSIS — Z5111 Encounter for antineoplastic chemotherapy: Secondary | ICD-10-CM | POA: Diagnosis not present

## 2018-02-25 LAB — URINALYSIS, DIPSTICK ONLY
Bilirubin Urine: NEGATIVE
Glucose, UA: NEGATIVE mg/dL
Hgb urine dipstick: NEGATIVE
KETONES UR: NEGATIVE mg/dL
LEUKOCYTES UA: NEGATIVE
Nitrite: NEGATIVE
Protein, ur: NEGATIVE mg/dL
Specific Gravity, Urine: 1.01 (ref 1.005–1.030)
pH: 5 (ref 5.0–8.0)

## 2018-02-25 LAB — COMPREHENSIVE METABOLIC PANEL
ALBUMIN: 3.4 g/dL — AB (ref 3.5–5.0)
ALT: 12 U/L (ref 0–44)
ANION GAP: 8 (ref 5–15)
AST: 17 U/L (ref 15–41)
Alkaline Phosphatase: 70 U/L (ref 38–126)
BUN: 10 mg/dL (ref 6–20)
CO2: 22 mmol/L (ref 22–32)
Calcium: 8.8 mg/dL — ABNORMAL LOW (ref 8.9–10.3)
Chloride: 108 mmol/L (ref 98–111)
Creatinine, Ser: 0.93 mg/dL (ref 0.61–1.24)
GFR calc Af Amer: >60 mL/min (ref >60)
GFR calc non Af Amer: >60 mL/min (ref >60)
Glucose, Bld: 121 mg/dL — ABNORMAL HIGH (ref 70–99)
Potassium: 4 mmol/L (ref 3.5–5.1)
Sodium: 138 mmol/L (ref 135–145)
Total Bilirubin: 0.3 mg/dL (ref 0.3–1.2)
Total Protein: 6.8 g/dL (ref 6.5–8.1)

## 2018-02-25 LAB — CBC WITH DIFFERENTIAL/PLATELET
Abs Immature Granulocytes: 0.01 10*3/uL (ref 0.00–0.07)
BASOS PCT: 1 %
Basophils Absolute: 0 10*3/uL (ref 0.0–0.1)
Eosinophils Absolute: 0.1 10*3/uL (ref 0.0–0.5)
Eosinophils Relative: 3 %
HCT: 39.2 % (ref 39.0–52.0)
Hemoglobin: 12.7 g/dL — ABNORMAL LOW (ref 13.0–17.0)
Immature Granulocytes: 0 %
Lymphocytes Relative: 24 %
Lymphs Abs: 1.1 10*3/uL (ref 0.7–4.0)
MCH: 30.8 pg (ref 26.0–34.0)
MCHC: 32.4 g/dL (ref 30.0–36.0)
MCV: 94.9 fL (ref 80.0–100.0)
Monocytes Absolute: 0.5 10*3/uL (ref 0.1–1.0)
Monocytes Relative: 10 %
NEUTROS PCT: 62 %
Neutro Abs: 2.9 10*3/uL (ref 1.7–7.7)
Platelets: 281 10*3/uL (ref 150–400)
RBC: 4.13 MIL/uL — ABNORMAL LOW (ref 4.22–5.81)
RDW: 14.6 % (ref 11.5–15.5)
WBC: 4.7 10*3/uL (ref 4.0–10.5)
nRBC: 0 % (ref 0.0–0.2)

## 2018-02-25 LAB — MAGNESIUM: Magnesium: 2 mg/dL (ref 1.7–2.4)

## 2018-02-25 MED ORDER — SODIUM CHLORIDE 0.9 % IV SOLN
10.0000 mg | Freq: Once | INTRAVENOUS | Status: AC
  Administered 2018-02-25: 10 mg via INTRAVENOUS
  Filled 2018-02-25: qty 1

## 2018-02-25 MED ORDER — SODIUM CHLORIDE 0.9 % IV SOLN
2400.0000 mg/m2 | INTRAVENOUS | Status: DC
  Administered 2018-02-25: 5650 mg via INTRAVENOUS
  Filled 2018-02-25: qty 113

## 2018-02-25 MED ORDER — ATROPINE SULFATE 1 MG/ML IJ SOLN
0.5000 mg | Freq: Once | INTRAMUSCULAR | Status: AC | PRN
  Administered 2018-02-25: 0.5 mg via INTRAVENOUS
  Filled 2018-02-25: qty 1

## 2018-02-25 MED ORDER — IRINOTECAN HCL CHEMO INJECTION 100 MG/5ML
180.0000 mg/m2 | Freq: Once | INTRAVENOUS | Status: AC
  Administered 2018-02-25: 420 mg via INTRAVENOUS
  Filled 2018-02-25: qty 15

## 2018-02-25 MED ORDER — PALONOSETRON HCL INJECTION 0.25 MG/5ML
0.2500 mg | Freq: Once | INTRAVENOUS | Status: AC
  Administered 2018-02-25: 0.25 mg via INTRAVENOUS
  Filled 2018-02-25: qty 5

## 2018-02-25 MED ORDER — SODIUM CHLORIDE 0.9 % IV SOLN
Freq: Once | INTRAVENOUS | Status: AC
  Administered 2018-02-25: 10:00:00 via INTRAVENOUS

## 2018-02-25 MED ORDER — LEUCOVORIN CALCIUM INJECTION 350 MG
900.0000 mg | Freq: Once | INTRAVENOUS | Status: AC
  Administered 2018-02-25: 900 mg via INTRAVENOUS
  Filled 2018-02-25: qty 45

## 2018-02-25 MED ORDER — SODIUM CHLORIDE 0.9% FLUSH
10.0000 mL | INTRAVENOUS | Status: DC | PRN
  Administered 2018-02-25: 10 mL
  Filled 2018-02-25: qty 10

## 2018-02-25 MED ORDER — FLUOROURACIL CHEMO INJECTION 2.5 GM/50ML
400.0000 mg/m2 | Freq: Once | INTRAVENOUS | Status: AC
  Administered 2018-02-25: 950 mg via INTRAVENOUS
  Filled 2018-02-25: qty 19

## 2018-02-25 NOTE — Progress Notes (Signed)
Will give Folfiri today only per Dr. Walden Field.    Treatment given per orders. Patient tolerated it well without problems. Vitals stable and discharged home from clinic ambulatory. Follow up as scheduled.

## 2018-02-25 NOTE — Progress Notes (Signed)
98 Heart rate, 142/68, 97% sat 10:20 am. Review flowsheet.

## 2018-02-25 NOTE — Progress Notes (Signed)
    Labs reviewed by Dr. Walden Field today at office visit. Will treat today with folfiri only. No vectibix. May add avastin next treatment.  Continuous 5FU pump connected per protocol.  Treatment given per orders. Patient tolerated it well without problems. Vitals stable and discharged home from clinic ambulatory. Follow up as scheduled.

## 2018-02-25 NOTE — Progress Notes (Signed)
Diagnosis Colon cancer metastasized to multiple sites [facility]) - Plan: CBC with Differential/Platelet, Comprehensive metabolic panel, Lactate dehydrogenase, Urinalysis, dipstick only  Staging Cancer Staging Malignant neoplasm of transverse colon [facility]) Staging form: Colon and Rectum, AJCC 7th Edition - Pathologic stage from 03/04/2015: Stage IIIC (T4a, N2a, cM0) - Signed by Baird Cancer, PA-C on 03/04/2015 - Pathologic: M1 - Signed by Zoila Shutter, MD on 01/23/2018   Assessment and Plan:  1. Stage IV CRC.  Pt was initially diagnosed with Stage IIIC adenocarcinoma of transverse colon.  63 yr old male previously followed by Dr. Talbert Cage.  He was diagnosed in 01/2015 on screening colonoscopy.  CEA elevated at time of diagnosis at 6.6. Treated with definitve surgery with right hemicolectomy by Dr. Adonis Huguenin on 02/12/15. Went on to have adjuvant chemo with FOLFOX x 12 cycles; chemo course was complicated by progressive peripheral neuropathy and thus Oxaliplatin was dose-reduced for cycles #6-11, and then d/c'd for cycle #12.   Pt had colonoscopy in 01/2016 with Dr. Oneida Alar. Path revealed 1 hyperplastic polyp.  No evidence of malignancy.    Pt was recommended for CT imaging but has not had that performed due to inability to get scan approved.    CT chest, abdomen and pelvis was recommended as his last imaging was in 10/2016 for ongoing assessment due to symptoms.    Pt had CT CAP done 10/23/2017 showed  IMPRESSION: 1. Subtle but new nodularity along the upper omentum along with nodular density in the pelvic mesentery adjacent to the sigmoid colon, concerning for peritoneal and omental implants of tumor. 2. Mild wall thickening in the sigmoid colon could reflect a low-grade colitis. 3. Trace perihepatic ascites. 4. Other imaging findings of potential clinical significance: Aortic Atherosclerosis (ICD10-I70.0) and Emphysema (ICD10-J43.9). Coronary atherosclerosis. Airway thickening is present, suggesting  bronchitis or reactive airways disease. Degenerative right glenohumeral arthropathy with free osteochondral fragment in the subscapular recess. Nonobstructive single bilateral punctate renal calculi. Prior right hemicolectomy. Mild prostatomegaly. Mild impingement at L4-5 and L5-S1.  Pt was recommended for PET scan which was done on 11/19/2017 that showed  IMPRESSION: 1. Similar to 10/23/2017 is progressive peritoneal nodularity worrisome for peritoneal carcinomatosis. This appears new when compared with 10/19/2016. 2. Increase in size of right paratracheal lymph node with mild FDG uptake. Nonspecific. There is also mild nonspecific increased uptake within the right hilar region. 3. Reticulonodular densities identified within both lungs with an upper lobe predominance are favored to represent postinflammatory changes. 4. Aortic Atherosclerosis (ICD10-I70.0) and Emphysema (ICD10-J43.9). 5. Coronary artery atherosclerotic calcifications.  Labs done 11/16/2017 showed CEA elevated at 12.9.  Previously, Scan discussed with Dr. Oneida Alar and IR.  Dr. Markus Daft  reviewed CT scan and pt was referred to IR for review for possible CT biopsy of omental lesions.   Biopsy was attempted on 11/30/2017 but was unsuccessful.    Pt was referred to Dr. Arnoldo Morale of surgery and underwent peritoneal biopsy of implants on 12/19/2017 with pathology returning as adenocarcinoma consistent with prior CRC.    Previously, I have discussed with pt multicenter trials have shown benefit of standard regimen of FolFIRI  in pts who have progressed after prior exposure to Oxaliplatin.  Goals of therapy discussed which is disease control.  Pt will have repeat imaging after 3 months of therapy.    Pt has completed C1 of Folfiri.  Foundation testing reviewed showed evidence of wild type KRAS.  Previously I discussed randomized trial of vectibix plus FOLFIRI versus FOLFIRI alone after failure of initial  FU-containing chemotherapy  (two-thirds prior oxaliplatin, 20 percent prior bevacizumab) showed that, in the KRAS wild-type group (n = 597), the addition of panitumumab was associated with a significant improvement in response rate (35 versus 10 percent) and median PFS (5.9 versus 3.9 months).  Overall regimen is well tolerated other than known toxicities associated with anti- EGFR therapy.    Vectibix was dosed at 6 mg/kg IV every 2 weeks with FolFiri with 38f dosed at 400 mg/m2 with 5FU 2400 mg/m2 CIVI over 46 hours, Leucovorin 400 mg/m2, Irinotecan 180 mg/m2.  Treatment planned for every 2 weeks.   Pt has rash that is consistent with Vectibix therapy.  He was previously recommended for Doxycycline 100 mg po bid, Clindamycin gel and hydrocortisone cream.  He reports clindamycin gel causes burning and does not want to use it.   Treatment was held as pt was not sure he wanted to continue Vectibix therapy due to rash.  Previously, he was  advised to use acne face wash to face and chest area, and to continue doxycycline and HC cream.    Long talk held with pt today and I again discussed recommendations for anti-EGFR therapy due to wild type KRAS.  He remains unsure if he desires to continue with Vectibix.  I have given him option of Avastin in the metastatic setting.  Will hold Vectibix today and proceed with Folfiri alone today.  Labs reviewed and showedWBC 4.7 HB 12.7 plts 281,000.  Chemistries WNL with K+ 4 Cr 0.93 and normal LFTs.  UA negative for protein.  He will RTC in 2 weeks for follow-up and labs prior to next cycle.     2.  Rash. Findings consistent with Vectibix therapy.  He was previously recommended for Doxycycline 100 mg po bid, Clindamycin gel and hydrocortisone cream.  He reports clindamycin gel causes burning and does not want to use it.  He is advised to use acne face wash to face and chest area, and to continue doxycycline and HC cream.  Rash on face improved today.  Will hold Vectibix today per pt wishes.    Reevaluate in 2 weeks for improvement   3.  LUL pulmonary nodule/ Right paratracheal LN.  Pt had 6 mm nodule on scan done 04/2016.  CT chest done 10/23/2017 showed stable nodule.  Pet scan done 11/19/2017 shows paratracheal LN is nonspecific and measures 9 mm.  Pt will have repeat imaging after 3 months of therapy.    4  HTN.  BP is 142/68.  Follow-up with PCP.    5.  Smoking.  Cessation is recommended.    6.  Neuropathy.  Continue neurontin.    7.  Bedbugs.  This was reported previously by nursing.  Pt reports he had therapy to apartment done.  Pt advised to keep rash clean as any insect bites may worsen symptoms.    25 minutes spent with more than 50% spent in counseling and coordination of care.    Interval History:  From TKirby Crigler PA-C's last note on 06/14/16)    Current Status:  Pt is seen today for follow-up prior to C4 of Folfiri and C2 of Vectibix.  He reports rash still present on chest but improved on face.     Oncology History   Stage IIIC ([facility] adenocarcinoma of transverse colon, diagnosed on colonoscopy by Dr. FOneida Alaron 01/12/2015 followed by definitive surgery by Dr. CClayburn Pertwith right hemicolectomy on 02/12/2015.  He then underwent FOLFOX x 12 cycles in the adjuvant  setting (03/30/2015- 09/06/2015).     Malignant neoplasm of transverse colon ([place])   01/12/2015 Pathologic Stage    Colon, biopsy, distal transverse - TUBULOVILLOUS ADENOMA WITH HIGH GRADE DYSPLASIA.    01/12/2015 Procedure    Colonoscopy by Dr. Oneida Alar.    01/12/2015 Tumor Marker    CEA: 6.6 (H)     01/18/2015 Imaging    CT abd/pelvis- Apple-core lesion identified in the mid transverse colon without obstruction. No evidence for lymphadenopathy in the gastrohepatic ligament or omentum.  Stable 8 mm hypo attenuating lesion in the left liver, likely a cyst.    02/10/2015 Initial Diagnosis    Adenocarcinoma of transverse colon ([place])    02/12/2015 Definitive Surgery    [name], Extended right  hemicolectomy     02/12/2015 Pathology Results    Mucinous adenocarcinoma with penetration of visceral peritoneum, 4/19 lymph nodes for metastatic disease, negative resection margins, with LVI and perineural invasion    03/30/2015 - 09/06/2015 Chemotherapy    FOLFOX x 12 cycles    05/25/2015 Treatment Plan Change    5 FU bolus discontinued for cycle #5    06/08/2015 Treatment Plan Change    Treatment deferred x 1 week    06/15/2015 Treatment Plan Change    5FU CI decreased by 10% and Oxaliplatin reduced by 15% for cycles #6-#11; Oxaliplatin dropped for cycle #12 d/t neuropathy.     10/20/2015 Imaging    CT CAP- Right hemicolectomy without evidence of metastatic disease. 2. Previously measured ground-glass lesion in the left upper lobe has resolved. 3. Probable food debris in the stomach, simulating gastric wall thickening. Please correlate clinically. 4. 6 mm irregular nodular density in the left upper lobe, stable. Continued attention on followup exams is warranted.    01/25/2016 Procedure    Colonoscopy by Dr. Oneida Alar- Non-thrombosed external hemorrhoids found on digital rectal exam. - One 4 mm polyp in the rectum, removed with a cold biopsy forceps. Resected and retrieved. - Congested mucosa in the neo-terminal ileum. Biopsied. - Redundant colon. - Internal hemorrhoids.    01/26/2016 Pathology Results    1. Terminal ileum, biopsy - MILD ACUTE (ACTIVE) ILEITIS. - NO DYSPLASIA OR MALIGNANCY IDENTIFIED - SEE COMMENT. 2. Rectum, polyp(s) - HYPERPLASTIC POLYP (X 1). - NO DYSPLASIA OR MALIGNANCY IDENTIFIED.    04/13/2016 Imaging    CT chest- Stable CT chest. 6 mm irregular nodule anterior left upper lobe is stable. The scattered areas of peribronchovascular micro nodularity in the lungs bilaterally are unchanged.    10/19/2016 Imaging    CT CAP: 1. Status post right hemicolectomy. No findings to suggest metastatic disease in the abdomen or pelvis. 2. Aortic atherosclerosis. 3.  Additional incidental findings, as above. Aortic Atherosclerosis (ICD10-I70.0).    01/09/2018 -  Chemotherapy    The patient had palonosetron (ALOXI) injection 0.25 mg, 0.25 mg, Intravenous,  Once, 4 of 4 cycles Administration: 0.25 mg (01/09/2018), 0.25 mg (01/23/2018), 0.25 mg (02/11/2018) irinotecan (CAMPTOSAR) 420 mg in dextrose 5 % 500 mL chemo infusion, 180 mg/m2 = 420 mg, Intravenous,  Once, 4 of 4 cycles Administration: 420 mg (01/09/2018), 420 mg (01/23/2018), 420 mg (02/11/2018) leucovorin 900 mg in dextrose 5 % 250 mL infusion, 944 mg, Intravenous,  Once, 4 of 4 cycles Administration: 900 mg (01/09/2018), 900 mg (01/23/2018), 900 mg (02/11/2018) fluorouracil (ADRUCIL) chemo injection 950 mg, 400 mg/m2 = 950 mg, Intravenous,  Once, 4 of 4 cycles Administration: 950 mg (01/09/2018), 950 mg (01/23/2018), 950 mg (02/11/2018) fluorouracil (ADRUCIL) 5,650 mg in  sodium chloride 0.9 % 137 mL chemo infusion, 2,400 mg/m2 = 5,650 mg, Intravenous, 1 Day/Dose, 4 of 4 cycles Administration: 5,650 mg (01/09/2018), 5,650 mg (01/23/2018), 5,650 mg (02/11/2018)  for chemotherapy treatment.     01/23/2018 Cancer Staging    Staging form: Colon and Rectum, AJCC 7th Edition - Pathologic: M1 - Signed by Zoila Shutter, MD on 01/23/2018     Malignant neoplasm of overlapping sites of colon ([place])   12/26/2017 Initial Diagnosis    Cancer of overlapping sites of colon metastatic to intra-abdominal lymph node ([place])    01/09/2018 -  Chemotherapy    The patient had palonosetron (ALOXI) injection 0.25 mg, 0.25 mg, Intravenous,  Once, 4 of 4 cycles Administration: 0.25 mg (01/09/2018), 0.25 mg (01/23/2018), 0.25 mg (02/11/2018) irinotecan (CAMPTOSAR) 420 mg in dextrose 5 % 500 mL chemo infusion, 180 mg/m2 = 420 mg, Intravenous,  Once, 4 of 4 cycles Administration: 420 mg (01/09/2018), 420 mg (01/23/2018), 420 mg (02/11/2018) leucovorin 900 mg in dextrose 5 % 250 mL infusion, 944 mg, Intravenous,  Once, 4 of 4 cycles Administration:  900 mg (01/09/2018), 900 mg (01/23/2018), 900 mg (02/11/2018) fluorouracil (ADRUCIL) chemo injection 950 mg, 400 mg/m2 = 950 mg, Intravenous,  Once, 4 of 4 cycles Administration: 950 mg (01/09/2018), 950 mg (01/23/2018), 950 mg (02/11/2018) fluorouracil (ADRUCIL) 5,650 mg in sodium chloride 0.9 % 137 mL chemo infusion, 2,400 mg/m2 = 5,650 mg, Intravenous, 1 Day/Dose, 4 of 4 cycles Administration: 5,650 mg (01/09/2018), 5,650 mg (01/23/2018), 5,650 mg (02/11/2018)  for chemotherapy treatment.      Colon cancer metastasized to multiple sites [facility])   01/16/2018 Initial Diagnosis    Colon cancer metastasized to multiple sites [facility])    01/23/2018 - 02/10/2018 Chemotherapy    The patient had panitumumab (VECTIBIX) 600 mg in sodium chloride 0.9 % 100 mL chemo infusion, 640 mg, Intravenous,  Once, 1 of 3 cycles Administration: 600 mg (01/23/2018)  for chemotherapy treatment.     03/11/2018 -  Chemotherapy    The patient had bevacizumab (AVASTIN) 525 mg in sodium chloride 0.9 % 100 mL chemo infusion, 5 mg/kg, Intravenous,  Once, 0 of 6 cycles  for chemotherapy treatment.       Problem List Patient Active Problem List   Diagnosis Date Noted  . Colon cancer metastasized to multiple sites [facility]) [C18.9] 01/16/2018  . Malignant neoplasm of overlapping sites of colon ([place]) [C18.8] 12/26/2017  . S/P exploratory laparotomy [Z98.890] 12/19/2017  . Carcinomatosis ([place]) [C80.0]   . Psoriasis [L40.9] 09/12/2016  . Pulmonary nodule [R91.1] 06/14/2016  . History of colon cancer [Z85.038]   . Diarrhea [R19.7] 01/06/2016  . Cigarette nicotine dependence with nicotine-induced disorder [F17.219] 06/18/2015  . Chronic obstructive pulmonary disease ([place]) [J44.9] 06/18/2015  . Depression [F32.9] 06/18/2015  . B12 deficiency [E53.8] 04/14/2015  . Malignant neoplasm of transverse colon ([place]) [C18.4] 02/10/2015  . GERD (gastroesophageal reflux disease) [K21.9] 12/15/2014  . History of colonic polyps [Z86.010] 12/15/2014    . History of alcohol abuse [F10.11] 09/16/2014  . Panic disorder [F41.0] 10/10/2012  . Major depressive disorder, single episode [F32.9] 10/10/2012  . Bilateral lower extremity edema [R60.0] 04/24/2012  . Hyperlipidemia [E78.5] 04/21/2009  . CAD, NATIVE VESSEL [I25.10] 03/25/2009  . HEAD TRAUMA [S09.90XA] 03/02/2009    Past Medical History Past Medical History:  Diagnosis Date  . Abnormal stress echocardiogram   . Adenocarcinoma of transverse colon ([place]) 02/10/2015  . Alcohol abuse    Heavy Use up until 2010  . Anxiety   .  Blood transfusion without reported diagnosis   . Cancer of overlapping sites of colon metastatic to intra-abdominal lymph node ([place]) 12/26/2017  . COPD (chronic obstructive pulmonary disease) ([place])   . Depression   . Emphysema of lung ([place])   . GERD (gastroesophageal reflux disease)   . Head trauma 2001   closed head injury; coma for 4 weeks  . Hypercholesterolemia   . Hypertension   . ING HERN W/GANGREN RECUR UNILAT/UNSPEC ING HERN 04/21/2009   Qualifier: Diagnosis of  By: Verl Blalock, MD, Delanna Ahmadi PTSD (post-traumatic stress disorder)   . Pulmonary nodule 06/14/2016  . SAH (subarachnoid hemorrhage) ([place]) 08/31/2012  . SDH (subdural hematoma) ([place]) 08/31/2012    Past Surgical History Past Surgical History:  Procedure Laterality Date  . BIOPSY  01/25/2016   Procedure: BIOPSY;  Surgeon: Danie Binder, MD;  [facility];  Service: Endoscopy;;  ileum;   . cardiac cath    . COLONOSCOPY  2011   Dr. Oneida Alar: multiple adenomas and hyperplastic polyps  . COLONOSCOPY WITH PROPOFOL N/A 01/12/2015   Procedure: COLONOSCOPY WITH PROPOFOL;  Surgeon: Danie Binder, MD;  [facility];  Service: Endoscopy;  Laterality: N/A;  1030  . COLONOSCOPY WITH PROPOFOL N/A 01/25/2016   Procedure: COLONOSCOPY WITH PROPOFOL;  Surgeon: Danie Binder, MD;  [facility];  Service: Endoscopy;  Laterality: N/A;  1230  . CRANIOTOMY  2001  .  ESOPHAGOGASTRODUODENOSCOPY (EGD) WITH PROPOFOL N/A 01/12/2015   Procedure: ESOPHAGOGASTRODUODENOSCOPY (EGD) WITH PROPOFOL;  Surgeon: Danie Binder, MD;  [facility];  Service: Endoscopy;  Laterality: N/A;  . head injury surgery    . HERNIA REPAIR Right 2012   Inguinal- [place] Na  . KIDNEY SURGERY     >30 years ago  . LAPAROSCOPIC RIGHT HEMI COLECTOMY Left 02/10/2015   Procedure: LAPAROSCOPIC THEN OPEN RIGHT HEMI COLECTOMY;  Surgeon: [name], MD;  Location: [facility] ORS;  Service: General;  Laterality: Left;  . LAPAROTOMY N/A 12/19/2017   Procedure: EXPLORATORY LAPAROTOMY;  Surgeon: Aviva Signs, MD;  Location: [facility];  Service: General;  Laterality: N/A;  . POLYPECTOMY  01/25/2016   Procedure: POLYPECTOMY;  Surgeon: Danie Binder, MD;  [facility];  Service: Endoscopy;;  colon  . PORT-A-CATH REMOVAL N/A 02/26/2017   Procedure: MINOR REMOVAL PORT-A-CATH;  Surgeon: Aviva Signs, MD;  Location: [facility];  Service: General;  Laterality: N/A;  Pt to arrive at [place] N/A 03/24/2015   Procedure: INSERTION PORT-A-CATH;  Surgeon: Jules Husbands, MD;  Location: [facility] ORS;  Service: General;  Laterality: N/A;  . PORTACATH PLACEMENT Left 01/07/2018   Procedure: INSERTION PORT A CATH (ATTACHED CATHETER IN LEFT SUBCLAVIAN);  Surgeon: Aviva Signs, MD;  Location: [facility];  Service: General;  Laterality: Left;    Family History Family History  Problem Relation Age of Onset  . Pulmonary embolism Mother   . Breast cancer Mother   . Cancer Mother        breast cancer  . Arthritis Mother   . Heart disease Father   . Cancer Father 68       Leukemia  . Cancer Paternal Grandfather        Lung  . Ataxia Neg Hx   . Chorea Neg Hx   . Dementia Neg Hx   . Mental retardation Neg Hx   . Migraines Neg Hx   . Multiple sclerosis Neg Hx   . Neurofibromatosis Neg  Hx   . Neuropathy Neg Hx   . Parkinsonism Neg Hx   . Seizures Neg Hx   . Stroke Neg Hx   . Colon  cancer Neg Hx      Social History  reports that he quit smoking about 2 months ago. His smoking use included cigarettes. He started smoking about 45 years ago. He has a 3.00 pack-year smoking history. He has never used smokeless tobacco. He reports current alcohol use. He reports that he does not use drugs.  Medications  Current Outpatient Medications:  .  amitriptyline (ELAVIL) 25 MG tablet, TAKE 1 TABLET BY MOUTH EVERY NIGHT AT BEDTIME (Patient taking differently: Take 25 mg by mouth at bedtime as needed for sleep. ), Disp: 30 tablet, Rfl: 0 .  atorvastatin (LIPITOR) 20 MG tablet, Take 1 tablet (20 mg total) by mouth at bedtime. (Patient taking differently: Take 20 mg by mouth every morning. ), Disp: 90 tablet, Rfl: 3 .  buPROPion (WELLBUTRIN SR) 150 MG 12 hr tablet, Take 150 mg by mouth every morning. , Disp: , Rfl:  .  busPIRone (BUSPAR) 10 MG tablet, Take 10 mg by mouth 2 (two) times daily., Disp: , Rfl:  .  clindamycin-benzoyl peroxide (BENZACLIN) gel, Apply topically 2 (two) times daily., Disp: 50 g, Rfl: 2 .  dexamethasone (DECADRON) 4 MG tablet, Take 2 tablets (8 mg total) by mouth daily. Start the day after chemo for 2 days., Disp: 8 tablet, Rfl: 5 .  doxycycline (VIBRA-TABS) 100 MG tablet, Take 1 tablet (100 mg total) by mouth 2 (two) times daily., Disp: 60 tablet, Rfl: 3 .  fluorouracil CALGB 37858 in sodium chloride 0.9 % 150 mL, Inject 5,650 mg into the vein. Over 46 hours, Disp: , Rfl:  .  FLUoxetine (PROZAC) 20 MG capsule, TAKE 1 CAPSULE BY MOUTH 2 TIMES A DAY (Patient taking differently: Take 20 mg by mouth 2 (two) times daily. ), Disp: 60 capsule, Rfl: 0 .  gabapentin (NEURONTIN) 300 MG capsule, TAKE 1 CAPSULE BY MOUTH IN THE MORNING AND 3 CAPSULES BY MOUTH AT BEDTIME, Disp: 120 capsule, Rfl: 0 .  hydrocortisone 1 % ointment, Apply 1 application topically 2 (two) times daily., Disp: 30 g, Rfl: 0 .  IRINOTECAN HCL IV, Inject 420 mg into the vein every 14 (fourteen) days., Disp: ,  Rfl:  .  LEUCOVORIN CALCIUM IV, Inject 944 mg into the vein every 14 (fourteen) days., Disp: , Rfl:  .  lidocaine (XYLOCAINE) 2 % solution, Use as directed 15 mLs in the mouth or throat every 4 (four) hours as needed for mouth pain (as an aid to eat and drink)., Disp: 240 mL, Rfl: 0 .  lidocaine-prilocaine (EMLA) cream, APPLY A SMALL AMOUNT OVER PORT SITE AND COVER WITH PLASTIC WRAP ONE HOUR PRIOR TO APPOINTMENT, Disp: 30 g, Rfl: 0 .  loperamide (IMODIUM) 2 MG capsule, TAKE 1 TO 2 CAPSULES BY MOUTH EVERY 6 HOURS AS NEEDED FOR DIARRHEA OR LOOSE STOOLS, Disp: 60 capsule, Rfl: 0 .  omeprazole (PRILOSEC) 20 MG capsule, Take 20 mg by mouth every morning. , Disp: , Rfl:  .  ondansetron (ZOFRAN) 8 MG tablet, Take 1 tablet (8 mg total) by mouth 2 (two) times daily as needed for refractory nausea / vomiting. Start on day 3 after chemotherapy., Disp: 30 tablet, Rfl: 1 .  oxyCODONE-acetaminophen (PERCOCET) 7.5-325 MG tablet, Take 1 tablet by mouth every 6 (six) hours as needed. (Patient not taking: Reported on 02/02/2018), Disp: 20 tablet, Rfl: 0 .  Panitumumab (VECTIBIX IV), Inject 600 mg into the vein every 14 (fourteen) days., Disp: , Rfl:  .  PROAIR HFA 108 (90 Base) MCG/ACT inhaler, INHALE 2 PUFFS BY MOUTH EVERY 6 HOURS AS NEEDED FOR SHORTNESS OF BREATH/WHEEZING. (Patient taking differently: Inhale 2 puffs into the lungs every 6 (six) hours as needed for wheezing or shortness of breath. ), Disp: 18 each, Rfl: 2 .  prochlorperazine (COMPAZINE) 10 MG tablet, Take 1 tablet (10 mg total) by mouth every 6 (six) hours as needed (NAUSEA)., Disp: 30 tablet, Rfl: 1 .  triamcinolone ointment (KENALOG) 0.5 %, Apply 1 application topically 2 (two) times daily., Disp: 60 g, Rfl: 2 No current facility-administered medications for this visit.   Facility-Administered Medications Ordered in Other Visits:  .  fluorouracil (ADRUCIL) 5,650 mg in sodium chloride 0.9 % 137 mL chemo infusion, 2,400 mg/m2 (Treatment Plan  Recorded), Intravenous, 1 day or 1 dose, Kahli Fitzgerald, MD .  fluorouracil (ADRUCIL) chemo injection 950 mg, 400 mg/m2 (Treatment Plan Recorded), Intravenous, Once, Nazire Fruth, Mathis Dad, MD .  irinotecan (CAMPTOSAR) 420 mg in dextrose 5 % 500 mL chemo infusion, 180 mg/m2 (Treatment Plan Recorded), Intravenous, Once, Melora Menon, MD .  leucovorin 900 mg in dextrose 5 % 250 mL infusion, 900 mg, Intravenous, Once, Zyshawn Bohnenkamp, MD .  sodium chloride flush (NS) 0.9 % injection 10 mL, 10 mL, Intracatheter, PRN, Halleigh Comes, MD, 10 mL at 02/25/18 0959  Allergies Patient has no known allergies.  Review of Systems Review of Systems - Oncology ROS negative   Physical Exam  Vitals Wt Readings from Last 3 Encounters:  02/25/18 234 lb 12.8 oz (106.5 kg)  02/11/18 240 lb 1.6 oz (108.9 kg)  02/02/18 240 lb (108.9 kg)   Temp Readings from Last 3 Encounters:  02/25/18 98.4 F (36.9 C) (Oral)  02/13/18 (!) 97.4 F (36.3 C) (Oral)  02/11/18 98.2 F (36.8 C) (Oral)   BP Readings from Last 3 Encounters:  02/25/18 (!) 142/68  02/25/18 (!) 151/91  02/25/18 (!) 144/83   Pulse Readings from Last 3 Encounters:  02/25/18 98  02/25/18 (!) 120  02/13/18 (!) 110   Constitutional: Well-developed, well-nourished, and in no distress.   HENT: Head: Normocephalic and atraumatic.  Mouth/Throat: No oropharyngeal exudate. Mucosa moist. Eyes: Pupils are equal, round, and reactive to light. Conjunctivae are normal. No scleral icterus.  Neck: Normal range of motion. Neck supple. No JVD present.  Cardiovascular: Normal rate, regular rhythm and normal heart sounds.  Exam reveals no gallop and no friction rub.   No murmur heard. Pulmonary/Chest: Effort normal and breath sounds normal. No respiratory distress. No wheezes.No rales.  Abdominal: Soft. Bowel sounds are normal. No distension. There is no tenderness. There is no guarding.  Musculoskeletal: No edema or tenderness.  Lymphadenopathy: No cervical, axillary or  supraclavicular adenopathy.  Neurological: Alert and oriented to person, place, and time. No cranial nerve deficit.  Skin: Rash on face improved.  Rash on chest improving and consistent with Vectibix.   Psychiatric: Affect and judgment normal.   Labs Infusion on 02/25/2018  Component Date Value Ref Range Status  . Color, Urine 02/25/2018 YELLOW  YELLOW Final  . APPearance 02/25/2018 CLEAR  CLEAR Final  . Specific Gravity, Urine 02/25/2018 1.010  1.005 - 1.030 Final  . pH 02/25/2018 5.0  5.0 - 8.0 Final  . Glucose, UA 02/25/2018 NEGATIVE  NEGATIVE mg/dL Final  . Hgb urine dipstick 02/25/2018 NEGATIVE  NEGATIVE Final  . Bilirubin Urine 02/25/2018 NEGATIVE  NEGATIVE Final  .  Ketones, ur 02/25/2018 NEGATIVE  NEGATIVE mg/dL Final  . Protein, ur 02/25/2018 NEGATIVE  NEGATIVE mg/dL Final  . Nitrite 02/25/2018 NEGATIVE  NEGATIVE Final  . Leukocytes, UA 02/25/2018 NEGATIVE  NEGATIVE Final   Performed at [facility], [address], [place], [place] [address]  . Magnesium 02/25/2018 2.0  1.7 - 2.4 mg/dL Final   Performed at [facility], [address], [place], [place] [address]  . Sodium 02/25/2018 138  135 - 145 mmol/L Final  . Potassium 02/25/2018 4.0  3.5 - 5.1 mmol/L Final  . Chloride 02/25/2018 108  98 - 111 mmol/L Final  . CO2 02/25/2018 22  22 - 32 mmol/L Final  . Glucose, Bld 02/25/2018 121* 70 - 99 mg/dL Final  . BUN 02/25/2018 10  6 - 20 mg/dL Final  . Creatinine, Ser 02/25/2018 0.93  0.61 - 1.24 mg/dL Final  . Calcium 02/25/2018 8.8* 8.9 - 10.3 mg/dL Final  . Total Protein 02/25/2018 6.8  6.5 - 8.1 g/dL Final  . Albumin 02/25/2018 3.4* 3.5 - 5.0 g/dL Final  . AST 02/25/2018 17  15 - 41 U/L Final  . ALT 02/25/2018 12  0 - 44 U/L Final  . Alkaline Phosphatase 02/25/2018 70  38 - 126 U/L Final  . Total Bilirubin 02/25/2018 0.3  0.3 - 1.2 mg/dL Final  . GFR calc non Af Amer 02/25/2018 >60  >60 mL/min Final  . GFR calc Af Amer 02/25/2018 >60  >60 mL/min Final  . Anion gap  02/25/2018 8  5 - 15 Final   Performed at [facility], [address] [place] [place], [place] [address]  . WBC 02/25/2018 4.7  4.0 - 10.5 K/uL Final  . RBC 02/25/2018 4.13* 4.22 - 5.81 MIL/uL Final  . Hemoglobin 02/25/2018 12.7* 13.0 - 17.0 g/dL Final  . HCT 02/25/2018 39.2  39.0 - 52.0 % Final  . MCV 02/25/2018 94.9  80.0 - 100.0 fL Final  . MCH 02/25/2018 30.8  26.0 - 34.0 pg Final  . MCHC 02/25/2018 32.4  30.0 - 36.0 g/dL Final  . RDW 02/25/2018 14.6  11.5 - 15.5 % Final  . Platelets 02/25/2018 281  150 - 400 K/uL Final  . nRBC 02/25/2018 0.0  0.0 - 0.2 % Final  . Neutrophils Relative % 02/25/2018 62  % Final  . Neutro Abs 02/25/2018 2.9  1.7 - 7.7 K/uL Final  . Lymphocytes Relative 02/25/2018 24  % Final  . Lymphs Abs 02/25/2018 1.1  0.7 - 4.0 K/uL Final  . Monocytes Relative 02/25/2018 10  % Final  . Monocytes Absolute 02/25/2018 0.5  0.1 - 1.0 K/uL Final  . Eosinophils Relative 02/25/2018 3  % Final  . Eosinophils Absolute 02/25/2018 0.1  0.0 - 0.5 K/uL Final  . Basophils Relative 02/25/2018 1  % Final  . Basophils Absolute 02/25/2018 0.0  0.0 - 0.1 K/uL Final  . Immature Granulocytes 02/25/2018 0  % Final  . Abs Immature Granulocytes 02/25/2018 0.01  0.00 - 0.07 K/uL Final   Performed at [facility], [address] [place], [place] [address]     Pathology Orders Placed This Encounter  Procedures  . CBC with Differential/Platelet    Standing Status:   Future    Standing Expiration Date:   02/26/2019  . Comprehensive metabolic panel    Standing Status:   Future    Standing Expiration Date:   02/26/2019  . Lactate dehydrogenase    Standing Status:   Future    Standing Expiration Date:  02/26/2019  . Urinalysis, dipstick only    Standing Status:   Future    Standing Expiration Date:   02/26/2019       Zoila Shutter MD

## 2018-02-25 NOTE — Patient Instructions (Signed)
[facility] Discharge Instructions for Patients Receiving Chemotherapy  Today you received the following chemotherapy agents   To help prevent nausea and vomiting after your treatment, we encourage you to take your nausea medication   If you develop nausea and vomiting that is not controlled by your nausea medication, call the clinic.   BELOW ARE SYMPTOMS THAT SHOULD BE REPORTED IMMEDIATELY:  *FEVER GREATER THAN 100.5 F  *CHILLS WITH OR WITHOUT FEVER  NAUSEA AND VOMITING THAT IS NOT CONTROLLED WITH YOUR NAUSEA MEDICATION  *UNUSUAL SHORTNESS OF BREATH  *UNUSUAL BRUISING OR BLEEDING  TENDERNESS IN MOUTH AND THROAT WITH OR WITHOUT PRESENCE OF ULCERS  *URINARY PROBLEMS  *BOWEL PROBLEMS  UNUSUAL RASH Items with * indicate a potential emergency and should be followed up as soon as possible.  Feel free to call the clinic should you have any questions or concerns. The clinic phone number is [phone].  Please show the CHEMO ALERT CARD at check-in to the Emergency Department and triage nurse.   

## 2018-02-26 NOTE — Patient Instructions (Signed)
[facility] Discharge Instructions for Patients Receiving Chemotherapy  Today you received the following chemotherapy agents   To help prevent nausea and vomiting after your treatment, we encourage you to take your nausea medication   If you develop nausea and vomiting that is not controlled by your nausea medication, call the clinic.   BELOW ARE SYMPTOMS THAT SHOULD BE REPORTED IMMEDIATELY:  *FEVER GREATER THAN 100.5 F  *CHILLS WITH OR WITHOUT FEVER  NAUSEA AND VOMITING THAT IS NOT CONTROLLED WITH YOUR NAUSEA MEDICATION  *UNUSUAL SHORTNESS OF BREATH  *UNUSUAL BRUISING OR BLEEDING  TENDERNESS IN MOUTH AND THROAT WITH OR WITHOUT PRESENCE OF ULCERS  *URINARY PROBLEMS  *BOWEL PROBLEMS  UNUSUAL RASH Items with * indicate a potential emergency and should be followed up as soon as possible.  Feel free to call the clinic should you have any questions or concerns. The clinic phone number is [phone].  Please show the CHEMO ALERT CARD at check-in to the Emergency Department and triage nurse.   

## 2018-02-27 ENCOUNTER — Other Ambulatory Visit: Payer: Self-pay

## 2018-02-27 ENCOUNTER — Inpatient Hospital Stay (HOSPITAL_COMMUNITY): Payer: Medicaid Other

## 2018-02-27 ENCOUNTER — Encounter (HOSPITAL_COMMUNITY): Payer: Self-pay

## 2018-02-27 VITALS — BP 146/68 | HR 92 | Temp 98.3°F | Resp 18

## 2018-02-27 DIAGNOSIS — Z5111 Encounter for antineoplastic chemotherapy: Secondary | ICD-10-CM | POA: Diagnosis not present

## 2018-02-27 DIAGNOSIS — E538 Deficiency of other specified B group vitamins: Secondary | ICD-10-CM

## 2018-02-27 DIAGNOSIS — C188 Malignant neoplasm of overlapping sites of colon: Secondary | ICD-10-CM

## 2018-02-27 DIAGNOSIS — C184 Malignant neoplasm of transverse colon: Secondary | ICD-10-CM

## 2018-02-27 MED ORDER — CYANOCOBALAMIN 1000 MCG/ML IJ SOLN
1000.0000 ug | Freq: Once | INTRAMUSCULAR | Status: AC
  Administered 2018-02-27: 1000 ug via INTRAMUSCULAR

## 2018-02-27 MED ORDER — HEPARIN SOD (PORK) LOCK FLUSH 100 UNIT/ML IV SOLN
500.0000 [IU] | Freq: Once | INTRAVENOUS | Status: AC | PRN
  Administered 2018-02-27: 500 [IU]

## 2018-02-27 MED ORDER — SODIUM CHLORIDE 0.9% FLUSH
10.0000 mL | INTRAVENOUS | Status: DC | PRN
  Administered 2018-02-27: 10 mL
  Filled 2018-02-27: qty 10

## 2018-03-06 ENCOUNTER — Other Ambulatory Visit (HOSPITAL_COMMUNITY): Payer: Self-pay | Admitting: Hematology

## 2018-03-06 ENCOUNTER — Other Ambulatory Visit (HOSPITAL_COMMUNITY): Payer: Self-pay | Admitting: Internal Medicine

## 2018-03-06 DIAGNOSIS — C184 Malignant neoplasm of transverse colon: Secondary | ICD-10-CM

## 2018-03-06 DIAGNOSIS — T451X5A Adverse effect of antineoplastic and immunosuppressive drugs, initial encounter: Principal | ICD-10-CM

## 2018-03-06 DIAGNOSIS — G620 Drug-induced polyneuropathy: Secondary | ICD-10-CM

## 2018-03-12 ENCOUNTER — Encounter (HOSPITAL_COMMUNITY): Payer: Self-pay

## 2018-03-12 ENCOUNTER — Inpatient Hospital Stay (HOSPITAL_BASED_OUTPATIENT_CLINIC_OR_DEPARTMENT_OTHER): Payer: Medicaid Other | Admitting: Internal Medicine

## 2018-03-12 ENCOUNTER — Inpatient Hospital Stay (HOSPITAL_COMMUNITY): Payer: Medicaid Other | Attending: Hematology

## 2018-03-12 ENCOUNTER — Inpatient Hospital Stay (HOSPITAL_COMMUNITY): Payer: Medicaid Other

## 2018-03-12 VITALS — BP 114/70 | HR 88 | Temp 98.0°F | Resp 18 | Wt 240.2 lb

## 2018-03-12 DIAGNOSIS — C188 Malignant neoplasm of overlapping sites of colon: Secondary | ICD-10-CM | POA: Diagnosis present

## 2018-03-12 DIAGNOSIS — Z5111 Encounter for antineoplastic chemotherapy: Secondary | ICD-10-CM | POA: Diagnosis not present

## 2018-03-12 DIAGNOSIS — R911 Solitary pulmonary nodule: Secondary | ICD-10-CM | POA: Diagnosis not present

## 2018-03-12 DIAGNOSIS — C772 Secondary and unspecified malignant neoplasm of intra-abdominal lymph nodes: Secondary | ICD-10-CM | POA: Insufficient documentation

## 2018-03-12 DIAGNOSIS — C184 Malignant neoplasm of transverse colon: Secondary | ICD-10-CM

## 2018-03-12 DIAGNOSIS — Z87891 Personal history of nicotine dependence: Secondary | ICD-10-CM

## 2018-03-12 DIAGNOSIS — I10 Essential (primary) hypertension: Secondary | ICD-10-CM

## 2018-03-12 DIAGNOSIS — R21 Rash and other nonspecific skin eruption: Secondary | ICD-10-CM | POA: Diagnosis not present

## 2018-03-12 DIAGNOSIS — G620 Drug-induced polyneuropathy: Secondary | ICD-10-CM

## 2018-03-12 DIAGNOSIS — C189 Malignant neoplasm of colon, unspecified: Secondary | ICD-10-CM

## 2018-03-12 LAB — CBC WITH DIFFERENTIAL/PLATELET
Abs Immature Granulocytes: 0.02 10*3/uL (ref 0.00–0.07)
BASOS PCT: 1 %
Basophils Absolute: 0 10*3/uL (ref 0.0–0.1)
Eosinophils Absolute: 0.1 10*3/uL (ref 0.0–0.5)
Eosinophils Relative: 3 %
HCT: 36 % — ABNORMAL LOW (ref 39.0–52.0)
Hemoglobin: 11.5 g/dL — ABNORMAL LOW (ref 13.0–17.0)
Immature Granulocytes: 1 %
Lymphocytes Relative: 36 %
Lymphs Abs: 1.4 10*3/uL (ref 0.7–4.0)
MCH: 30.3 pg (ref 26.0–34.0)
MCHC: 31.9 g/dL (ref 30.0–36.0)
MCV: 95 fL (ref 80.0–100.0)
MONO ABS: 0.5 10*3/uL (ref 0.1–1.0)
Monocytes Relative: 12 %
Neutro Abs: 1.8 10*3/uL (ref 1.7–7.7)
Neutrophils Relative %: 47 %
Platelets: 240 10*3/uL (ref 150–400)
RBC: 3.79 MIL/uL — ABNORMAL LOW (ref 4.22–5.81)
RDW: 15.4 % (ref 11.5–15.5)
WBC: 3.8 10*3/uL — ABNORMAL LOW (ref 4.0–10.5)
nRBC: 0 % (ref 0.0–0.2)

## 2018-03-12 LAB — URINALYSIS, DIPSTICK ONLY
Bilirubin Urine: NEGATIVE
Glucose, UA: NEGATIVE mg/dL
Hgb urine dipstick: NEGATIVE
Ketones, ur: NEGATIVE mg/dL
Leukocytes, UA: NEGATIVE
Nitrite: NEGATIVE
Protein, ur: NEGATIVE mg/dL
Specific Gravity, Urine: 1.013 (ref 1.005–1.030)
pH: 5 (ref 5.0–8.0)

## 2018-03-12 LAB — COMPREHENSIVE METABOLIC PANEL
ALK PHOS: 61 U/L (ref 38–126)
ALT: 11 U/L (ref 0–44)
ANION GAP: 7 (ref 5–15)
AST: 12 U/L — ABNORMAL LOW (ref 15–41)
Albumin: 3.4 g/dL — ABNORMAL LOW (ref 3.5–5.0)
BUN: 12 mg/dL (ref 6–20)
CO2: 25 mmol/L (ref 22–32)
Calcium: 8.5 mg/dL — ABNORMAL LOW (ref 8.9–10.3)
Chloride: 107 mmol/L (ref 98–111)
Creatinine, Ser: 0.85 mg/dL (ref 0.61–1.24)
GFR calc Af Amer: >60 mL/min (ref >60)
GFR calc non Af Amer: >60 mL/min (ref >60)
Glucose, Bld: 87 mg/dL (ref 70–99)
Potassium: 4.1 mmol/L (ref 3.5–5.1)
Sodium: 139 mmol/L (ref 135–145)
Total Bilirubin: 0.3 mg/dL (ref 0.3–1.2)
Total Protein: 6.6 g/dL (ref 6.5–8.1)

## 2018-03-12 LAB — LACTATE DEHYDROGENASE: LDH: 125 U/L (ref 98–192)

## 2018-03-12 MED ORDER — FLUOROURACIL CHEMO INJECTION 2.5 GM/50ML
400.0000 mg/m2 | Freq: Once | INTRAVENOUS | Status: AC
  Administered 2018-03-12: 950 mg via INTRAVENOUS
  Filled 2018-03-12: qty 19

## 2018-03-12 MED ORDER — HEPARIN SOD (PORK) LOCK FLUSH 100 UNIT/ML IV SOLN
INTRAVENOUS | Status: AC
  Filled 2018-03-12: qty 5

## 2018-03-12 MED ORDER — PALONOSETRON HCL INJECTION 0.25 MG/5ML
0.2500 mg | Freq: Once | INTRAVENOUS | Status: AC
  Administered 2018-03-12: 0.25 mg via INTRAVENOUS
  Filled 2018-03-12: qty 5

## 2018-03-12 MED ORDER — LEUCOVORIN CALCIUM INJECTION 350 MG
900.0000 mg | Freq: Once | INTRAVENOUS | Status: AC
  Administered 2018-03-12: 900 mg via INTRAVENOUS
  Filled 2018-03-12: qty 10

## 2018-03-12 MED ORDER — IRINOTECAN HCL CHEMO INJECTION 100 MG/5ML
180.0000 mg/m2 | Freq: Once | INTRAVENOUS | Status: AC
  Administered 2018-03-12: 420 mg via INTRAVENOUS
  Filled 2018-03-12: qty 15

## 2018-03-12 MED ORDER — SODIUM CHLORIDE 0.9% FLUSH
10.0000 mL | INTRAVENOUS | Status: DC | PRN
  Administered 2018-03-12: 10 mL
  Filled 2018-03-12: qty 10

## 2018-03-12 MED ORDER — SODIUM CHLORIDE 0.9 % IV SOLN
2400.0000 mg/m2 | INTRAVENOUS | Status: DC
  Administered 2018-03-12: 5650 mg via INTRAVENOUS
  Filled 2018-03-12: qty 113

## 2018-03-12 MED ORDER — SODIUM CHLORIDE 0.9 % IV SOLN
500.0000 mg | Freq: Once | INTRAVENOUS | Status: AC
  Administered 2018-03-12: 500 mg via INTRAVENOUS
  Filled 2018-03-12: qty 4

## 2018-03-12 MED ORDER — SODIUM CHLORIDE 0.9 % IV SOLN
Freq: Once | INTRAVENOUS | Status: AC
  Administered 2018-03-12: 10:00:00 via INTRAVENOUS

## 2018-03-12 MED ORDER — SODIUM CHLORIDE 0.9 % IV SOLN
Freq: Once | INTRAVENOUS | Status: AC
  Administered 2018-03-12: 12:00:00 via INTRAVENOUS

## 2018-03-12 MED ORDER — ATROPINE SULFATE 1 MG/ML IJ SOLN
0.5000 mg | Freq: Once | INTRAMUSCULAR | Status: AC
  Administered 2018-03-12: 0.5 mg via INTRAVENOUS
  Filled 2018-03-12: qty 1

## 2018-03-12 MED ORDER — SODIUM CHLORIDE 0.9 % IV SOLN
10.0000 mg | Freq: Once | INTRAVENOUS | Status: AC
  Administered 2018-03-12: 10 mg via INTRAVENOUS
  Filled 2018-03-12: qty 10

## 2018-03-12 NOTE — Progress Notes (Signed)
1005 Lab and urine results reviewed with and pt seen by Dr. Walden Field today and pt approved for chemo tx per MD                                                                                     Erik [name] tolerated chemo tx well without complaints or incident. Pt discharged with 5FU pump infusing without issues. VSS upon discharge. Pt discharged self ambulatory in satisfactory condition

## 2018-03-12 NOTE — Patient Instructions (Signed)
[facility] Discharge Instructions for Patients Receiving Chemotherapy   Beginning January 23rd 2017 lab work for the Cancer Center will be done in the  Main lab at [place] on 1st floor. If you have a lab appointment with the Cancer Center please come in thru the  Main Entrance and check in at the main information desk   Today you received the following chemotherapy agents Avastin,Irinotecan,Leucovorin and 5FU. Follow-up as scheduled. Call clinic for any questions or concerns  To help prevent nausea and vomiting after your treatment, we encourage you to take your nausea medication   If you develop nausea and vomiting, or diarrhea that is not controlled by your medication, call the clinic.  The clinic phone number is [phone]. Office hours are Monday-Friday 8:30am-5:00pm.  BELOW ARE SYMPTOMS THAT SHOULD BE REPORTED IMMEDIATELY:  *FEVER GREATER THAN 101.0 F  *CHILLS WITH OR WITHOUT FEVER  NAUSEA AND VOMITING THAT IS NOT CONTROLLED WITH YOUR NAUSEA MEDICATION  *UNUSUAL SHORTNESS OF BREATH  *UNUSUAL BRUISING OR BLEEDING  TENDERNESS IN MOUTH AND THROAT WITH OR WITHOUT PRESENCE OF ULCERS  *URINARY PROBLEMS  *BOWEL PROBLEMS  UNUSUAL RASH Items with * indicate a potential emergency and should be followed up as soon as possible. If you have an emergency after office hours please contact your primary care physician or go to the nearest emergency department.  Please call the clinic during office hours if you have any questions or concerns.   You may also contact the Patient Navigator at [phone] should you have any questions or need assistance in obtaining follow up care.      Resources For Cancer Patients and their Caregivers ? American Cancer Society: Can assist with transportation, wigs, general needs, runs Look Good Feel Better.        1-888-227-6333 ? Cancer Care: Provides financial assistance, online support groups, medication/co-pay  assistance.  1-800-813-HOPE (4673) ? [facility] Assists [place] cancer patients and their families through emotional , educational and financial support.  336-427-4357 ? [place] DSS Where to apply for food stamps, Medicaid and utility assistance. 336-342-1394 ? [facility]: Transportation to medical appointments. 336-347-2287 ? Social Security Administration: May apply for disability if have a Stage IV cancer. 336-342-7796 1-800-772-1213 ? [place] [facility], [facility]: Assists with nutrition, care and transit needs. 336-349-2343         

## 2018-03-12 NOTE — Patient Instructions (Signed)
[facility] Discharge Instructions for Patients Receiving Chemotherapy   Beginning January 23rd 2017 lab work for the Cancer Center will be done in the  Main lab at [place] on 1st floor. If you have a lab appointment with the Cancer Center please come in thru the  Main Entrance and check in at the main information desk   Today you received the following chemotherapy agents Avastin,Irinotecan,Leucovorin and 5FU. Follow-up as scheduled. Call clinic for any questions or concerns  To help prevent nausea and vomiting after your treatment, we encourage you to take your nausea medication   If you develop nausea and vomiting, or diarrhea that is not controlled by your medication, call the clinic.  The clinic phone number is [phone]. Office hours are Monday-Friday 8:30am-5:00pm.  BELOW ARE SYMPTOMS THAT SHOULD BE REPORTED IMMEDIATELY:  *FEVER GREATER THAN 101.0 F  *CHILLS WITH OR WITHOUT FEVER  NAUSEA AND VOMITING THAT IS NOT CONTROLLED WITH YOUR NAUSEA MEDICATION  *UNUSUAL SHORTNESS OF BREATH  *UNUSUAL BRUISING OR BLEEDING  TENDERNESS IN MOUTH AND THROAT WITH OR WITHOUT PRESENCE OF ULCERS  *URINARY PROBLEMS  *BOWEL PROBLEMS  UNUSUAL RASH Items with * indicate a potential emergency and should be followed up as soon as possible. If you have an emergency after office hours please contact your primary care physician or go to the nearest emergency department.  Please call the clinic during office hours if you have any questions or concerns.   You may also contact the Patient Navigator at [phone] should you have any questions or need assistance in obtaining follow up care.      Resources For Cancer Patients and their Caregivers ? American Cancer Society: Can assist with transportation, wigs, general needs, runs Look Good Feel Better.        1-888-227-6333 ? Cancer Care: Provides financial assistance, online support groups, medication/co-pay  assistance.  1-800-813-HOPE (4673) ? [facility] Assists [place] cancer patients and their families through emotional , educational and financial support.  336-427-4357 ? [place] DSS Where to apply for food stamps, Medicaid and utility assistance. 336-342-1394 ? [facility]: Transportation to medical appointments. 336-347-2287 ? Social Security Administration: May apply for disability if have a Stage IV cancer. 336-342-7796 1-800-772-1213 ? [place] [facility], [facility] Transit Services: Assists with nutrition, care and transit needs. 336-349-2343         

## 2018-03-12 NOTE — Progress Notes (Signed)
Diagnosis Malignant neoplasm of overlapping sites of colon ([place]) - Plan: CBC with Differential/Platelet, Comprehensive metabolic panel, Lactate dehydrogenase, Urinalysis, dipstick only  Staging Cancer Staging Malignant neoplasm of transverse colon [facility]) Staging form: Colon and Rectum, AJCC 7th Edition - Pathologic stage from 03/04/2015: Stage IIIC (T4a, N2a, cM0) - Signed by Baird Cancer, PA-C on 03/04/2015 - Pathologic: M1 - Signed by Zoila Shutter, MD on 01/23/2018   Assessment and Plan:  1. Stage IV CRC.  Pt was initially diagnosed with Stage IIIC adenocarcinoma of transverse colon.  63 yr old male previously followed by Dr. Talbert Cage.  He was diagnosed in 01/2015 on screening colonoscopy.  CEA elevated at time of diagnosis at 6.6. Treated with definitve surgery with right hemicolectomy by Dr. Adonis Huguenin on 02/12/15. Went on to have adjuvant chemo with FOLFOX x 12 cycles; chemo course was complicated by progressive peripheral neuropathy and thus Oxaliplatin was dose-reduced for cycles #6-11, and then d/c'd for cycle #12.   Pt had colonoscopy in 01/2016 with Dr. Oneida Alar. Path revealed 1 hyperplastic polyp.  No evidence of malignancy.    Pt was recommended for CT imaging but has not had that performed due to inability to get scan approved.    CT chest, abdomen and pelvis was recommended as his last imaging was in 10/2016 for ongoing assessment due to symptoms.    Pt had CT CAP done 10/23/2017 showed  IMPRESSION: 1. Subtle but new nodularity along the upper omentum along with nodular density in the pelvic mesentery adjacent to the sigmoid colon, concerning for peritoneal and omental implants of tumor. 2. Mild wall thickening in the sigmoid colon could reflect a low-grade colitis. 3. Trace perihepatic ascites. 4. Other imaging findings of potential clinical significance: Aortic Atherosclerosis (ICD10-I70.0) and Emphysema (ICD10-J43.9). Coronary atherosclerosis. Airway thickening is present,  suggesting bronchitis or reactive airways disease. Degenerative right glenohumeral arthropathy with free osteochondral fragment in the subscapular recess. Nonobstructive single bilateral punctate renal calculi. Prior right hemicolectomy. Mild prostatomegaly. Mild impingement at L4-5 and L5-S1.  Pt was recommended for PET scan which was done on 11/19/2017 that showed  IMPRESSION: 1. Similar to 10/23/2017 is progressive peritoneal nodularity worrisome for peritoneal carcinomatosis. This appears new when compared with 10/19/2016. 2. Increase in size of right paratracheal lymph node with mild FDG uptake. Nonspecific. There is also mild nonspecific increased uptake within the right hilar region. 3. Reticulonodular densities identified within both lungs with an upper lobe predominance are favored to represent postinflammatory changes. 4. Aortic Atherosclerosis (ICD10-I70.0) and Emphysema (ICD10-J43.9). 5. Coronary artery atherosclerotic calcifications.  Labs done 11/16/2017 showed CEA elevated at 12.9.  Previously, Scan discussed with Dr. Oneida Alar and IR.  Dr. Markus Daft  reviewed CT scan and pt was referred to IR for review for possible CT biopsy of omental lesions.   Biopsy was attempted on 11/30/2017 but was unsuccessful.    Pt was referred to Dr. Arnoldo Morale of surgery and underwent peritoneal biopsy of implants on 12/19/2017 with pathology returning as adenocarcinoma consistent with prior CRC.    Previously, I have discussed with pt multicenter trials have shown benefit of standard regimen of FolFIRI  in pts who have progressed after prior exposure to Oxaliplatin.  Goals of therapy discussed which is disease control.    Pt has completed C1 of Folfiri.  Foundation testing reviewed showed evidence of wild type KRAS.  Previously I discussed randomized trial of vectibix plus FOLFIRI versus FOLFIRI alone after failure of initial FU-containing chemotherapy (two-thirds prior oxaliplatin, 20 percent prior  bevacizumab)  showed that, in the KRAS wild-type group (n = 597), the addition of panitumumab was associated with a significant improvement in response rate (35 versus 10 percent) and median PFS (5.9 versus 3.9 months).  Overall regimen is well tolerated other than known toxicities associated with anti- EGFR therapy.    Vectibix was dosed at 6 mg/kg IV every 2 weeks with FolFiri with 18f dosed at 400 mg/m2 with 5FU 2400 mg/m2 CIVI over 46 hours, Leucovorin 400 mg/m2, Irinotecan 180 mg/m2.  Treatment planned for every 2 weeks.   Pt has rash that is consistent with Vectibix therapy.  He was previously recommended for Doxycycline 100 mg po bid, Clindamycin gel and hydrocortisone cream.  He reports clindamycin gel causes burning and does not want to use it.   Treatment was held as pt was not sure he wanted to continue Vectibix therapy due to rash.  Previously, he was  advised to use acne face wash to face and chest area, and to continue doxycycline and HC cream.    Despite discussions regarding recommendations for anti-EGFR therapy due to wild type KRAS.  He does not desire to continue with Vectibix. Discussion held regarding Avastin therapy in metastatic setting.    Pt will begin C1 of Avastin today. He is here for C5 of Avastin.   Labs done 03/12/2018 reviewed and showed WBC 3.8 HB 11.5 plts 240,000.  Chemistries WNL with K+ 4.1 Cr 0.85 and normal LFTs.  UA negative for protein.  He will be treated every 2 weeks. Pt will be seen for follow-up in 1 month with labs. Anticipate repeat imaging in April 2020.    2.  Rash. Findings consistent with Vectibix therapy.  He was previously recommended for Doxycycline 100 mg po bid, Clindamycin gel and hydrocortisone cream.  He reports clindamycin gel causes burning and does not want to use it.  He is advised to use acne face wash to face and chest area, and to continue doxycycline and HC cream.  Rash on face improved today.  Pt does not desire to continue Vectibix.     3.  LUL pulmonary nodule/ Right paratracheal LN.  Pt had 6 mm nodule on scan done 04/2016.  CT chest done 10/23/2017 showed stable nodule.  Pet scan done 11/19/2017 shows paratracheal LN is nonspecific and measures 9 mm.  Pt will have repeat imaging in April 2020.    4  HTN.  BP is 114/70.  Follow-up with PCP.    5.  Smoking.  Cessation is recommended.    6.  Neuropathy.  Continue neurontin.    7.  Bedbugs.  This was reported previously by nursing.  Pt reports he had treatment to apartment done.  Pt advised to keep rash clean as any insect bites may worsen symptoms.    25 minutes spent with more than 50% spent in counseling and coordination of care.    Interval History:  From TKirby Crigler PA-C's last note on 06/14/16)    Current Status:  Pt is seen today for follow-up prior to C5 of Folfiri and C1 of Avastin.  He feels rash has improved    Oncology History   Stage IIIC (([id number] adenocarcinoma of transverse colon, diagnosed on colonoscopy by Dr. FOneida Alaron 01/12/2015 followed by definitive surgery by Dr. CClayburn Pertwith right hemicolectomy on 02/12/2015.  He then underwent FOLFOX x 12 cycles in the adjuvant setting (03/30/2015- 09/06/2015).     Malignant neoplasm of transverse colon ([place]   01/12/2015 Pathologic Stage  Colon, biopsy, distal transverse - TUBULOVILLOUS ADENOMA WITH HIGH GRADE DYSPLASIA.    01/12/2015 Procedure    Colonoscopy by Dr. Oneida Alar.    01/12/2015 Tumor Marker    CEA: 6.6 (H)     01/18/2015 Imaging    CT abd/pelvis- Apple-core lesion identified in the mid transverse colon without obstruction. No evidence for lymphadenopathy in the gastrohepatic ligament or omentum.  Stable 8 mm hypo attenuating lesion in the left liver, likely a cyst.    02/10/2015 Initial Diagnosis    Adenocarcinoma of transverse colon ([place])    02/12/2015 Definitive Surgery    [name], Extended right hemicolectomy     02/12/2015 Pathology Results    Mucinous adenocarcinoma with  penetration of visceral peritoneum, 4/19 lymph nodes for metastatic disease, negative resection margins, with LVI and perineural invasion    03/30/2015 - 09/06/2015 Chemotherapy    FOLFOX x 12 cycles    05/25/2015 Treatment Plan Change    5 FU bolus discontinued for cycle #5    06/08/2015 Treatment Plan Change    Treatment deferred x 1 week    06/15/2015 Treatment Plan Change    5FU CI decreased by 10% and Oxaliplatin reduced by 15% for cycles #6-#11; Oxaliplatin dropped for cycle #12 d/t neuropathy.     10/20/2015 Imaging    CT CAP- Right hemicolectomy without evidence of metastatic disease. 2. Previously measured ground-glass lesion in the left upper lobe has resolved. 3. Probable food debris in the stomach, simulating gastric wall thickening. Please correlate clinically. 4. 6 mm irregular nodular density in the left upper lobe, stable. Continued attention on followup exams is warranted.    01/25/2016 Procedure    Colonoscopy by Dr. Oneida Alar- Non-thrombosed external hemorrhoids found on digital rectal exam. - One 4 mm polyp in the rectum, removed with a cold biopsy forceps. Resected and retrieved. - Congested mucosa in the neo-terminal ileum. Biopsied. - Redundant colon. - Internal hemorrhoids.    01/26/2016 Pathology Results    1. Terminal ileum, biopsy - MILD ACUTE (ACTIVE) ILEITIS. - NO DYSPLASIA OR MALIGNANCY IDENTIFIED - SEE COMMENT. 2. Rectum, polyp(s) - HYPERPLASTIC POLYP (X 1). - NO DYSPLASIA OR MALIGNANCY IDENTIFIED.    04/13/2016 Imaging    CT chest- Stable CT chest. 6 mm irregular nodule anterior left upper lobe is stable. The scattered areas of peribronchovascular micro nodularity in the lungs bilaterally are unchanged.    10/19/2016 Imaging    CT CAP: 1. Status post right hemicolectomy. No findings to suggest metastatic disease in the abdomen or pelvis. 2. Aortic atherosclerosis. 3. Additional incidental findings, as above. Aortic Atherosclerosis (ICD10-I70.0).      01/09/2018 -  Chemotherapy    The patient had palonosetron (ALOXI) injection 0.25 mg, 0.25 mg, Intravenous,  Once, 5 of 6 cycles Administration: 0.25 mg (01/09/2018), 0.25 mg (01/23/2018), 0.25 mg (02/11/2018), 0.25 mg (02/25/2018), 0.25 mg (03/12/2018) irinotecan (CAMPTOSAR) 420 mg in dextrose 5 % 500 mL chemo infusion, 180 mg/m2 = 420 mg, Intravenous,  Once, 5 of 6 cycles Administration: 420 mg (01/09/2018), 420 mg (01/23/2018), 420 mg (02/11/2018), 420 mg (02/25/2018) leucovorin 900 mg in dextrose 5 % 250 mL infusion, 944 mg, Intravenous,  Once, 5 of 6 cycles Administration: 900 mg (01/09/2018), 900 mg (01/23/2018), 900 mg (02/11/2018), 900 mg (02/25/2018) fluorouracil (ADRUCIL) chemo injection 950 mg, 400 mg/m2 = 950 mg, Intravenous,  Once, 5 of 6 cycles Administration: 950 mg (01/09/2018), 950 mg (01/23/2018), 950 mg (02/11/2018), 950 mg (02/25/2018) fluorouracil (ADRUCIL) 5,650 mg in sodium chloride 0.9 % 137  mL chemo infusion, 2,400 mg/m2 = 5,650 mg, Intravenous, 1 Day/Dose, 5 of 6 cycles Administration: 5,650 mg (01/09/2018), 5,650 mg (01/23/2018), 5,650 mg (02/11/2018), 5,650 mg (02/25/2018)  for chemotherapy treatment.     01/23/2018 Cancer Staging    Staging form: Colon and Rectum, AJCC 7th Edition - Pathologic: M1 - Signed by Zoila Shutter, MD on 01/23/2018     Malignant neoplasm of overlapping sites of colon ([place])   12/26/2017 Initial Diagnosis    Cancer of overlapping sites of colon metastatic to intra-abdominal lymph node ([place])    01/09/2018 -  Chemotherapy    The patient had palonosetron (ALOXI) injection 0.25 mg, 0.25 mg, Intravenous,  Once, 5 of 6 cycles Administration: 0.25 mg (01/09/2018), 0.25 mg (01/23/2018), 0.25 mg (02/11/2018), 0.25 mg (02/25/2018), 0.25 mg (03/12/2018) irinotecan (CAMPTOSAR) 420 mg in dextrose 5 % 500 mL chemo infusion, 180 mg/m2 = 420 mg, Intravenous,  Once, 5 of 6 cycles Administration: 420 mg (01/09/2018), 420 mg (01/23/2018), 420 mg (02/11/2018), 420 mg  (02/25/2018) leucovorin 900 mg in dextrose 5 % 250 mL infusion, 944 mg, Intravenous,  Once, 5 of 6 cycles Administration: 900 mg (01/09/2018), 900 mg (01/23/2018), 900 mg (02/11/2018), 900 mg (02/25/2018) fluorouracil (ADRUCIL) chemo injection 950 mg, 400 mg/m2 = 950 mg, Intravenous,  Once, 5 of 6 cycles Administration: 950 mg (01/09/2018), 950 mg (01/23/2018), 950 mg (02/11/2018), 950 mg (02/25/2018) fluorouracil (ADRUCIL) 5,650 mg in sodium chloride 0.9 % 137 mL chemo infusion, 2,400 mg/m2 = 5,650 mg, Intravenous, 1 Day/Dose, 5 of 6 cycles Administration: 5,650 mg (01/09/2018), 5,650 mg (01/23/2018), 5,650 mg (02/11/2018), 5,650 mg (02/25/2018)  for chemotherapy treatment.      Colon cancer metastasized to multiple sites [facility])   01/16/2018 Initial Diagnosis    Colon cancer metastasized to multiple sites [facility])    01/23/2018 - 02/10/2018 Chemotherapy    The patient had panitumumab (VECTIBIX) 600 mg in sodium chloride 0.9 % 100 mL chemo infusion, 640 mg, Intravenous,  Once, 1 of 3 cycles Administration: 600 mg (01/23/2018)  for chemotherapy treatment.     03/12/2018 -  Chemotherapy    The patient had bevacizumab (AVASTIN) 525 mg in sodium chloride 0.9 % 100 mL chemo infusion, 5 mg/kg = 525 mg, Intravenous,  Once, 1 of 6 cycles  for chemotherapy treatment.       Problem List Patient Active Problem List   Diagnosis Date Noted  . Colon cancer metastasized to multiple sites [facility]) [C18.9] 01/16/2018  . Malignant neoplasm of overlapping sites of colon ([place]) [C18.8] 12/26/2017  . S/P exploratory laparotomy [Z98.890] 12/19/2017  . Carcinomatosis ([place]) [C80.0]   . Psoriasis [L40.9] 09/12/2016  . Pulmonary nodule [R91.1] 06/14/2016  . History of colon cancer [Z85.038]   . Diarrhea [R19.7] 01/06/2016  . Cigarette nicotine dependence with nicotine-induced disorder [F17.219] 06/18/2015  . Chronic obstructive pulmonary disease ([place]) [J44.9] 06/18/2015  . Depression [F32.9] 06/18/2015  . B12 deficiency  [E53.8] 04/14/2015  . Malignant neoplasm of transverse colon ([place]) [C18.4] 02/10/2015  . GERD (gastroesophageal reflux disease) [K21.9] 12/15/2014  . History of colonic polyps [Z86.010] 12/15/2014  . History of alcohol abuse [F10.11] 09/16/2014  . Panic disorder [F41.0] 10/10/2012  . Major depressive disorder, single episode [F32.9] 10/10/2012  . Bilateral lower extremity edema [R60.0] 04/24/2012  . Hyperlipidemia [E78.5] 04/21/2009  . CAD, NATIVE VESSEL [I25.10] 03/25/2009  . HEAD TRAUMA [S09.90XA] 03/02/2009    Past Medical History Past Medical History:  Diagnosis Date  . Abnormal stress echocardiogram   . Adenocarcinoma of transverse colon ([place])  02/10/2015  . Alcohol abuse    Heavy Use up until 2010  . Anxiety   . Blood transfusion without reported diagnosis   . Cancer of overlapping sites of colon metastatic to intra-abdominal lymph node ([place]) 12/26/2017  . COPD (chronic obstructive pulmonary disease) ([place])   . Depression   . Emphysema of lung ([place])   . GERD (gastroesophageal reflux disease)   . Head trauma 2001   closed head injury; coma for 4 weeks  . Hypercholesterolemia   . Hypertension   . ING HERN W/GANGREN RECUR UNILAT/UNSPEC ING HERN 04/21/2009   Qualifier: Diagnosis of  By: Verl Blalock, MD, Delanna Ahmadi PTSD (post-traumatic stress disorder)   . Pulmonary nodule 06/14/2016  . SAH (subarachnoid hemorrhage) ([place]) 08/31/2012  . SDH (subdural hematoma) ([place]) 08/31/2012    Past Surgical History Past Surgical History:  Procedure Laterality Date  . BIOPSY  01/25/2016   Procedure: BIOPSY;  Surgeon: Danie Binder, MD;  [facility];  Service: Endoscopy;;  ileum;   . cardiac cath    . COLONOSCOPY  2011   Dr. Oneida Alar: multiple adenomas and hyperplastic polyps  . COLONOSCOPY WITH PROPOFOL N/A 01/12/2015   Procedure: COLONOSCOPY WITH PROPOFOL;  Surgeon: Danie Binder, MD;  [facility];  Service: Endoscopy;  Laterality: N/A;  1030  . COLONOSCOPY WITH  PROPOFOL N/A 01/25/2016   Procedure: COLONOSCOPY WITH PROPOFOL;  Surgeon: Danie Binder, MD;  [facility];  Service: Endoscopy;  Laterality: N/A;  1230  . CRANIOTOMY  2001  . ESOPHAGOGASTRODUODENOSCOPY (EGD) WITH PROPOFOL N/A 01/12/2015   Procedure: ESOPHAGOGASTRODUODENOSCOPY (EGD) WITH PROPOFOL;  Surgeon: Danie Binder, MD;  [facility];  Service: Endoscopy;  Laterality: N/A;  . head injury surgery    . HERNIA REPAIR Right 2012   Inguinal- [place] Na  . KIDNEY SURGERY     >30 years ago  . LAPAROSCOPIC RIGHT HEMI COLECTOMY Left 02/10/2015   Procedure: LAPAROSCOPIC THEN OPEN RIGHT HEMI COLECTOMY;  Surgeon: [name], MD;  Location: [facility] ORS;  Service: General;  Laterality: Left;  . LAPAROTOMY N/A 12/19/2017   Procedure: EXPLORATORY LAPAROTOMY;  Surgeon: Aviva Signs, MD;  Location: [facility];  Service: General;  Laterality: N/A;  . POLYPECTOMY  01/25/2016   Procedure: POLYPECTOMY;  Surgeon: Danie Binder, MD;  [facility];  Service: Endoscopy;;  colon  . PORT-A-CATH REMOVAL N/A 02/26/2017   Procedure: MINOR REMOVAL PORT-A-CATH;  Surgeon: Aviva Signs, MD;  Location: [facility];  Service: General;  Laterality: N/A;  Pt to arrive at [place] N/A 03/24/2015   Procedure: INSERTION PORT-A-CATH;  Surgeon: Jules Husbands, MD;  Location: [facility] ORS;  Service: General;  Laterality: N/A;  . PORTACATH PLACEMENT Left 01/07/2018   Procedure: INSERTION PORT A CATH (ATTACHED CATHETER IN LEFT SUBCLAVIAN);  Surgeon: Aviva Signs, MD;  Location: [facility];  Service: General;  Laterality: Left;    Family History Family History  Problem Relation Age of Onset  . Pulmonary embolism Mother   . Breast cancer Mother   . Cancer Mother        breast cancer  . Arthritis Mother   . Heart disease Father   . Cancer Father 76       Leukemia  . Cancer Paternal Grandfather        Lung  . Ataxia Neg Hx   . Chorea Neg Hx   . Dementia Neg Hx   . Mental retardation  Neg Hx   . Migraines Neg Hx   . Multiple sclerosis Neg Hx   . Neurofibromatosis Neg Hx   . Neuropathy Neg Hx   . Parkinsonism Neg Hx   . Seizures Neg Hx   . Stroke Neg Hx   . Colon cancer Neg Hx      Social History  reports that he quit smoking about 3 months ago. His smoking use included cigarettes. He started smoking about 45 years ago. He has a 3.00 pack-year smoking history. He has never used smokeless tobacco. He reports current alcohol use. He reports that he does not use drugs.  Medications  Current Outpatient Medications:  .  amitriptyline (ELAVIL) 25 MG tablet, TAKE 1 TABLET BY MOUTH EVERY NIGHT AT BEDTIME (Patient taking differently: Take 25 mg by mouth at bedtime as needed for sleep. ), Disp: 30 tablet, Rfl: 0 .  atorvastatin (LIPITOR) 20 MG tablet, Take 1 tablet (20 mg total) by mouth at bedtime. (Patient taking differently: Take 20 mg by mouth every morning. ), Disp: 90 tablet, Rfl: 3 .  buPROPion (WELLBUTRIN SR) 150 MG 12 hr tablet, Take 150 mg by mouth every morning. , Disp: , Rfl:  .  busPIRone (BUSPAR) 10 MG tablet, Take 10 mg by mouth 2 (two) times daily., Disp: , Rfl:  .  clindamycin-benzoyl peroxide (BENZACLIN) gel, Apply topically 2 (two) times daily., Disp: 50 g, Rfl: 2 .  dexamethasone (DECADRON) 4 MG tablet, Take 2 tablets (8 mg total) by mouth daily. Start the day after chemo for 2 days., Disp: 8 tablet, Rfl: 5 .  doxycycline (VIBRA-TABS) 100 MG tablet, Take 1 tablet (100 mg total) by mouth 2 (two) times daily., Disp: 60 tablet, Rfl: 3 .  fluorouracil CALGB 07622 in sodium chloride 0.9 % 150 mL, Inject 5,650 mg into the vein. Over 46 hours, Disp: , Rfl:  .  FLUoxetine (PROZAC) 20 MG capsule, TAKE 1 CAPSULE BY MOUTH 2 TIMES A DAY (Patient taking differently: Take 20 mg by mouth 2 (two) times daily. ), Disp: 60 capsule, Rfl: 0 .  gabapentin (NEURONTIN) 300 MG capsule, Take 1 capsule (300 mg total) by mouth 3 (three) times daily., Disp: 120 capsule, Rfl: 0 .   hydrocortisone 1 % ointment, Apply 1 application topically 2 (two) times daily., Disp: 30 g, Rfl: 0 .  IRINOTECAN HCL IV, Inject 420 mg into the vein every 14 (fourteen) days., Disp: , Rfl:  .  LEUCOVORIN CALCIUM IV, Inject 944 mg into the vein every 14 (fourteen) days., Disp: , Rfl:  .  lidocaine (XYLOCAINE) 2 % solution, Use as directed 15 mLs in the mouth or throat every 4 (four) hours as needed for mouth pain (as an aid to eat and drink)., Disp: 240 mL, Rfl: 0 .  lidocaine-prilocaine (EMLA) cream, APPLY A SMALL AMOUNT OVER PORT SITE AND COVER WITH PLASTIC WRAP ONE HOUR PRIOR TO APPOINTMENT, Disp: 30 g, Rfl: 0 .  loperamide (IMODIUM) 2 MG capsule, TAKE 1 TO 2 CAPSULES BY MOUTH EVERY 6 HOURS AS NEEDED FOR DIARRHEA OR LOOSE STOOLS, Disp: 60 capsule, Rfl: 0 .  omeprazole (PRILOSEC) 20 MG capsule, Take 20 mg by mouth every morning. , Disp: , Rfl:  .  ondansetron (ZOFRAN) 8 MG tablet, Take 1 tablet (8 mg total) by mouth 2 (two) times daily as needed for refractory nausea / vomiting. Start on day 3 after chemotherapy., Disp: 30 tablet, Rfl: 1 .  oxyCODONE-acetaminophen (PERCOCET) 7.5-325 MG tablet, Take 1 tablet by mouth every 6 (  six) hours as needed. (Patient not taking: Reported on 02/02/2018), Disp: 20 tablet, Rfl: 0 .  PROAIR HFA 108 (90 Base) MCG/ACT inhaler, INHALE 2 PUFFS BY MOUTH EVERY 6 HOURS AS NEEDED FOR SHORTNESS OF BREATH/WHEEZING. (Patient taking differently: Inhale 2 puffs into the lungs every 6 (six) hours as needed for wheezing or shortness of breath. ), Disp: 18 each, Rfl: 2 .  prochlorperazine (COMPAZINE) 10 MG tablet, Take 1 tablet (10 mg total) by mouth every 6 (six) hours as needed (NAUSEA)., Disp: 30 tablet, Rfl: 1 .  triamcinolone ointment (KENALOG) 0.5 %, Apply 1 application topically 2 (two) times daily., Disp: 60 g, Rfl: 2 No current facility-administered medications for this visit.   Facility-Administered Medications Ordered in Other Visits:  .  0.9 %  sodium chloride  infusion, , Intravenous, Once, Dorsie Burich, MD .  bevacizumab (AVASTIN) 500 mg in sodium chloride 0.9 % 100 mL chemo infusion, 500 mg, Intravenous, Once, Koah Chisenhall, MD, Last Rate: 720 mL/hr at 03/12/18 1136, 500 mg at 03/12/18 1136 .  fluorouracil (ADRUCIL) 5,650 mg in sodium chloride 0.9 % 137 mL chemo infusion, 2,400 mg/m2 (Treatment Plan Recorded), Intravenous, 1 day or 1 dose, Tehya Leath, MD .  fluorouracil (ADRUCIL) chemo injection 950 mg, 400 mg/m2 (Treatment Plan Recorded), Intravenous, Once, Magon Croson, Mathis Dad, MD .  irinotecan (CAMPTOSAR) 420 mg in dextrose 5 % 500 mL chemo infusion, 180 mg/m2 (Treatment Plan Recorded), Intravenous, Once, Viyan Rosamond, MD .  leucovorin 900 mg in dextrose 5 % 250 mL infusion, 900 mg, Intravenous, Once, Richey Doolittle, MD .  sodium chloride flush (NS) 0.9 % injection 10 mL, 10 mL, Intracatheter, PRN, Junia Nygren, MD, 10 mL at 03/12/18 0847  Allergies Patient has no known allergies.  Review of Systems Review of Systems - Oncology ROS negative   Physical Exam  Vitals Wt Readings from Last 3 Encounters:  03/12/18 240 lb 3.2 oz (109 kg)  02/25/18 234 lb 12.8 oz (106.5 kg)  02/11/18 240 lb 1.6 oz (108.9 kg)   Temp Readings from Last 3 Encounters:  03/12/18 98 F (36.7 C) (Oral)  02/27/18 98.3 F (36.8 C) (Oral)  02/25/18 98.4 F (36.9 C) (Oral)   BP Readings from Last 3 Encounters:  03/12/18 114/70  02/27/18 (!) 146/68  02/25/18 (!) 142/68   Pulse Readings from Last 3 Encounters:  03/12/18 88  02/27/18 92  02/25/18 98   Constitutional: Well-developed, well-nourished, and in no distress.   HENT: Head: Normocephalic and atraumatic.  Mouth/Throat: No oropharyngeal exudate. Mucosa moist. Eyes: Pupils are equal, round, and reactive to light. Conjunctivae are normal. No scleral icterus.  Neck: Normal range of motion. Neck supple. No JVD present.  Cardiovascular: Normal rate, regular rhythm and normal heart sounds.  Exam reveals no gallop and  no friction rub.   No murmur heard. Pulmonary/Chest: Effort normal and breath sounds normal. No respiratory distress. No wheezes.No rales.  Abdominal: Soft. Bowel sounds are normal. No distension. There is no tenderness. There is no guarding.  Musculoskeletal: No edema or tenderness.  Lymphadenopathy: No cervical, axillary or supraclavicular adenopathy.  Neurological: Alert and oriented to person, place, and time. No cranial nerve deficit.  Skin: Skin is warm and dry. Rash improved on face and chest.   Psychiatric: Affect and judgment normal.   Labs Infusion on 03/12/2018  Component Date Value Ref Range Status  . Color, Urine 03/12/2018 YELLOW  YELLOW Final  . APPearance 03/12/2018 CLEAR  CLEAR Final  . Specific Gravity, Urine 03/12/2018 1.013  1.005 -  1.030 Final  . pH 03/12/2018 5.0  5.0 - 8.0 Final  . Glucose, UA 03/12/2018 NEGATIVE  NEGATIVE mg/dL Final  . Hgb urine dipstick 03/12/2018 NEGATIVE  NEGATIVE Final  . Bilirubin Urine 03/12/2018 NEGATIVE  NEGATIVE Final  . Ketones, ur 03/12/2018 NEGATIVE  NEGATIVE mg/dL Final  . Protein, ur 03/12/2018 NEGATIVE  NEGATIVE mg/dL Final  . Nitrite 03/12/2018 NEGATIVE  NEGATIVE Final  . Leukocytes, UA 03/12/2018 NEGATIVE  NEGATIVE Final   Performed at [facility], [address], [place], [place] [address]  . LDH 03/12/2018 125  98 - 192 U/L Final   Performed at [facility], [address] [place], [place] [address]  . Sodium 03/12/2018 139  135 - 145 mmol/L Final  . Potassium 03/12/2018 4.1  3.5 - 5.1 mmol/L Final  . Chloride 03/12/2018 107  98 - 111 mmol/L Final  . CO2 03/12/2018 25  22 - 32 mmol/L Final  . Glucose, Bld 03/12/2018 87  70 - 99 mg/dL Final  . BUN 03/12/2018 12  6 - 20 mg/dL Final  . Creatinine, Ser 03/12/2018 0.85  0.61 - 1.24 mg/dL Final  . Calcium 03/12/2018 8.5* 8.9 - 10.3 mg/dL Final  . Total Protein 03/12/2018 6.6  6.5 - 8.1 g/dL Final  . Albumin 03/12/2018 3.4* 3.5 - 5.0 g/dL Final  . AST 03/12/2018 12* 15 - 41  U/L Final  . ALT 03/12/2018 11  0 - 44 U/L Final  . Alkaline Phosphatase 03/12/2018 61  38 - 126 U/L Final  . Total Bilirubin 03/12/2018 0.3  0.3 - 1.2 mg/dL Final  . GFR calc non Af Amer 03/12/2018 >60  >60 mL/min Final  . GFR calc Af Amer 03/12/2018 >60  >60 mL/min Final  . Anion gap 03/12/2018 7  5 - 15 Final   Performed at [facility], [address] [place], [place] [address]  . WBC 03/12/2018 3.8* 4.0 - 10.5 K/uL Final  . RBC 03/12/2018 3.79* 4.22 - 5.81 MIL/uL Final  . Hemoglobin 03/12/2018 11.5* 13.0 - 17.0 g/dL Final  . HCT 03/12/2018 36.0* 39.0 - 52.0 % Final  . MCV 03/12/2018 95.0  80.0 - 100.0 fL Final  . MCH 03/12/2018 30.3  26.0 - 34.0 pg Final  . MCHC 03/12/2018 31.9  30.0 - 36.0 g/dL Final  . RDW 03/12/2018 15.4  11.5 - 15.5 % Final  . Platelets 03/12/2018 240  150 - 400 K/uL Final  . nRBC 03/12/2018 0.0  0.0 - 0.2 % Final  . Neutrophils Relative % 03/12/2018 47  % Final  . Neutro Abs 03/12/2018 1.8  1.7 - 7.7 K/uL Final  . Lymphocytes Relative 03/12/2018 36  % Final  . Lymphs Abs 03/12/2018 1.4  0.7 - 4.0 K/uL Final  . Monocytes Relative 03/12/2018 12  % Final  . Monocytes Absolute 03/12/2018 0.5  0.1 - 1.0 K/uL Final  . Eosinophils Relative 03/12/2018 3  % Final  . Eosinophils Absolute 03/12/2018 0.1  0.0 - 0.5 K/uL Final  . Basophils Relative 03/12/2018 1  % Final  . Basophils Absolute 03/12/2018 0.0  0.0 - 0.1 K/uL Final  . Immature Granulocytes 03/12/2018 1  % Final  . Abs Immature Granulocytes 03/12/2018 0.02  0.00 - 0.07 K/uL Final   Performed at [facility], [address], [place],  [address]     Pathology Orders Placed This Encounter  Procedures  . CBC with Differential/Platelet    Standing Status:   Future    Standing Expiration Date:  03/13/2019  . Comprehensive metabolic panel    Standing Status:   Future    Standing Expiration Date:   03/13/2019  . Lactate dehydrogenase    Standing Status:   Future    Standing Expiration Date:   03/13/2019   . Urinalysis, dipstick only    Standing Status:   Future    Standing Expiration Date:   03/13/2019       Zoila Shutter MD

## 2018-03-12 NOTE — Progress Notes (Signed)
Erik [name] tolerated chemo tx well without complaints or incident. Pt discharged with 5 FU pump infusing without issues. VSS upon discharge. Pt discharged self ambulatory using his cane in satisfactory condition

## 2018-03-14 ENCOUNTER — Inpatient Hospital Stay (HOSPITAL_COMMUNITY): Payer: Medicaid Other

## 2018-03-14 VITALS — BP 120/81 | HR 86 | Temp 98.0°F | Resp 18

## 2018-03-14 DIAGNOSIS — Z5111 Encounter for antineoplastic chemotherapy: Secondary | ICD-10-CM | POA: Diagnosis not present

## 2018-03-14 DIAGNOSIS — C188 Malignant neoplasm of overlapping sites of colon: Secondary | ICD-10-CM

## 2018-03-14 DIAGNOSIS — C184 Malignant neoplasm of transverse colon: Secondary | ICD-10-CM

## 2018-03-14 MED ORDER — HEPARIN SOD (PORK) LOCK FLUSH 100 UNIT/ML IV SOLN
500.0000 [IU] | Freq: Once | INTRAVENOUS | Status: AC | PRN
  Administered 2018-03-14: 500 [IU]

## 2018-03-14 MED ORDER — SODIUM CHLORIDE 0.9% FLUSH
10.0000 mL | INTRAVENOUS | Status: DC | PRN
  Administered 2018-03-14: 10 mL
  Filled 2018-03-14: qty 10

## 2018-03-14 NOTE — Progress Notes (Signed)
Erik [name] returns today for port de access and flush after 46 hr continous infusion of 5fu. Tolerated infusion without problems. Portacath located left chest wall was  deaccessed and flushed with 20ml NS and 500U/5ml Heparin and needle removed intact.  Procedure without incident. Patient tolerated procedure well.  Vitals stable and discharged home from clinic ambulatory. Follow up as scheduled.    

## 2018-03-14 NOTE — Patient Instructions (Signed)
[facility] at [facility]  Discharge Instructions:   _______________________________________________________________  Thank you for choosing [facility] at [facility] to provide your oncology and hematology care.  To afford each patient quality time with our providers, please arrive at least 15 minutes before your scheduled appointment.  You need to re-schedule your appointment if you arrive 10 or more minutes late.  We strive to give you quality time with our providers, and arriving late affects you and other patients whose appointments are after yours.  Also, if you no show three or more times for appointments you may be dismissed from the clinic.  Again, thank you for choosing [facility] at [facility]. Our hope is that these requests will allow you access to exceptional care and in a timely manner. _______________________________________________________________  If you have questions after your visit, please contact our office at [phone] between the hours of 8:30 a.m. and 5:00 p.m. Voicemails left after 4:30 p.m. will not be returned until the following business day. _______________________________________________________________  For prescription refill requests, have your pharmacy contact our office. _______________________________________________________________  Recommendations made by the consultant and any test results will be sent to your referring physician. _______________________________________________________________ 

## 2018-03-20 ENCOUNTER — Ambulatory Visit: Payer: Medicaid Other | Admitting: Neurology

## 2018-03-25 ENCOUNTER — Other Ambulatory Visit (HOSPITAL_COMMUNITY): Payer: Self-pay | Admitting: Internal Medicine

## 2018-03-25 DIAGNOSIS — G620 Drug-induced polyneuropathy: Secondary | ICD-10-CM

## 2018-03-25 DIAGNOSIS — T451X5A Adverse effect of antineoplastic and immunosuppressive drugs, initial encounter: Secondary | ICD-10-CM

## 2018-03-25 DIAGNOSIS — C184 Malignant neoplasm of transverse colon: Secondary | ICD-10-CM

## 2018-03-27 ENCOUNTER — Encounter (HOSPITAL_COMMUNITY): Payer: Self-pay

## 2018-03-27 ENCOUNTER — Inpatient Hospital Stay (HOSPITAL_COMMUNITY): Payer: Medicaid Other

## 2018-03-27 ENCOUNTER — Other Ambulatory Visit: Payer: Self-pay

## 2018-03-27 VITALS — BP 130/75 | HR 98 | Temp 97.6°F | Resp 18 | Wt 228.6 lb

## 2018-03-27 DIAGNOSIS — C184 Malignant neoplasm of transverse colon: Secondary | ICD-10-CM

## 2018-03-27 DIAGNOSIS — C188 Malignant neoplasm of overlapping sites of colon: Secondary | ICD-10-CM

## 2018-03-27 DIAGNOSIS — C189 Malignant neoplasm of colon, unspecified: Secondary | ICD-10-CM

## 2018-03-27 DIAGNOSIS — C772 Secondary and unspecified malignant neoplasm of intra-abdominal lymph nodes: Principal | ICD-10-CM

## 2018-03-27 DIAGNOSIS — Z5111 Encounter for antineoplastic chemotherapy: Secondary | ICD-10-CM | POA: Diagnosis not present

## 2018-03-27 LAB — COMPREHENSIVE METABOLIC PANEL
ALT: 23 U/L (ref 0–44)
AST: 26 U/L (ref 15–41)
Albumin: 4 g/dL (ref 3.5–5.0)
Alkaline Phosphatase: 74 U/L (ref 38–126)
Anion gap: 11 (ref 5–15)
BUN: 16 mg/dL (ref 6–20)
CO2: 26 mmol/L (ref 22–32)
Calcium: 9.2 mg/dL (ref 8.9–10.3)
Chloride: 101 mmol/L (ref 98–111)
Creatinine, Ser: 1.01 mg/dL (ref 0.61–1.24)
GFR calc Af Amer: >60 mL/min (ref >60)
GFR calc non Af Amer: >60 mL/min (ref >60)
Glucose, Bld: 114 mg/dL — ABNORMAL HIGH (ref 70–99)
Potassium: 4.6 mmol/L (ref 3.5–5.1)
Sodium: 138 mmol/L (ref 135–145)
TOTAL PROTEIN: 7.3 g/dL (ref 6.5–8.1)
Total Bilirubin: 1 mg/dL (ref 0.3–1.2)

## 2018-03-27 LAB — CBC WITH DIFFERENTIAL/PLATELET
Abs Immature Granulocytes: 0.02 10*3/uL (ref 0.00–0.07)
Basophils Absolute: 0 10*3/uL (ref 0.0–0.1)
Basophils Relative: 0 %
Eosinophils Absolute: 0 10*3/uL (ref 0.0–0.5)
Eosinophils Relative: 0 %
HCT: 44.2 % (ref 39.0–52.0)
Hemoglobin: 14.2 g/dL (ref 13.0–17.0)
Immature Granulocytes: 0 %
Lymphocytes Relative: 21 %
Lymphs Abs: 1.5 10*3/uL (ref 0.7–4.0)
MCH: 30.9 pg (ref 26.0–34.0)
MCHC: 32.1 g/dL (ref 30.0–36.0)
MCV: 96.3 fL (ref 80.0–100.0)
MONO ABS: 0.8 10*3/uL (ref 0.1–1.0)
Monocytes Relative: 11 %
Neutro Abs: 4.8 10*3/uL (ref 1.7–7.7)
Neutrophils Relative %: 68 %
Platelets: 219 10*3/uL (ref 150–400)
RBC: 4.59 MIL/uL (ref 4.22–5.81)
RDW: 16.4 % — ABNORMAL HIGH (ref 11.5–15.5)
WBC: 7.2 10*3/uL (ref 4.0–10.5)
nRBC: 0 % (ref 0.0–0.2)

## 2018-03-27 LAB — MAGNESIUM: Magnesium: 2.3 mg/dL (ref 1.7–2.4)

## 2018-03-27 MED ORDER — ATROPINE SULFATE 1 MG/ML IJ SOLN
0.5000 mg | Freq: Once | INTRAMUSCULAR | Status: AC
  Administered 2018-03-27: 0.5 mg via INTRAVENOUS

## 2018-03-27 MED ORDER — PALONOSETRON HCL INJECTION 0.25 MG/5ML
0.2500 mg | Freq: Once | INTRAVENOUS | Status: AC
  Administered 2018-03-27: 0.25 mg via INTRAVENOUS

## 2018-03-27 MED ORDER — IRINOTECAN HCL CHEMO INJECTION 100 MG/5ML
180.0000 mg/m2 | Freq: Once | INTRAVENOUS | Status: AC
  Administered 2018-03-27: 420 mg via INTRAVENOUS
  Filled 2018-03-27: qty 15

## 2018-03-27 MED ORDER — SODIUM CHLORIDE 0.9 % IV SOLN
Freq: Once | INTRAVENOUS | Status: AC
  Administered 2018-03-27: 10:00:00 via INTRAVENOUS

## 2018-03-27 MED ORDER — SODIUM CHLORIDE 0.9 % IV SOLN
Freq: Once | INTRAVENOUS | Status: AC
  Administered 2018-03-27: 12:00:00 via INTRAVENOUS

## 2018-03-27 MED ORDER — LEUCOVORIN CALCIUM INJECTION 350 MG
900.0000 mg | Freq: Once | INTRAVENOUS | Status: AC
  Administered 2018-03-27: 900 mg via INTRAVENOUS
  Filled 2018-03-27: qty 35

## 2018-03-27 MED ORDER — SODIUM CHLORIDE 0.9 % IV SOLN
10.0000 mg | Freq: Once | INTRAVENOUS | Status: AC
  Administered 2018-03-27: 10 mg via INTRAVENOUS
  Filled 2018-03-27: qty 10

## 2018-03-27 MED ORDER — SODIUM CHLORIDE 0.9 % IV SOLN: 10.0000 mg | Freq: Once | INTRAVENOUS | Status: DC

## 2018-03-27 MED ORDER — ATROPINE SULFATE 1 MG/ML IJ SOLN
INTRAMUSCULAR | Status: AC
  Filled 2018-03-27: qty 1

## 2018-03-27 MED ORDER — SODIUM CHLORIDE 0.9 % IV SOLN
500.0000 mg | Freq: Once | INTRAVENOUS | Status: AC
  Administered 2018-03-27: 500 mg via INTRAVENOUS
  Filled 2018-03-27: qty 4

## 2018-03-27 MED ORDER — SODIUM CHLORIDE 0.9 % IV SOLN
2400.0000 mg/m2 | INTRAVENOUS | Status: DC
  Administered 2018-03-27: 5650 mg via INTRAVENOUS
  Filled 2018-03-27: qty 113

## 2018-03-27 MED ORDER — FLUOROURACIL CHEMO INJECTION 2.5 GM/50ML
400.0000 mg/m2 | Freq: Once | INTRAVENOUS | Status: AC
  Administered 2018-03-27: 950 mg via INTRAVENOUS
  Filled 2018-03-27: qty 19

## 2018-03-27 MED ORDER — SODIUM CHLORIDE 0.9% FLUSH: 10.0000 mL | INTRAVENOUS | Status: DC | PRN

## 2018-03-27 MED ORDER — PALONOSETRON HCL INJECTION 0.25 MG/5ML
INTRAVENOUS | Status: AC
  Filled 2018-03-27: qty 5

## 2018-03-27 NOTE — Progress Notes (Signed)
Pt presents today for Folfiri/ Avastin. Day 1 Cycle 6. Pt has no complaints of pain or any changes since the last visit. BP and HR elevated. 158/85 and 122 HR. Pt does not take any BP medication but states, " I take medications for anxiety etc." Pt denies any chest pain, SOB, dizziness, or blurry vision.   BP recheck 138/78 and HR 111. HR 102 Per Dr. Walden Field. Proceed with treatment.   Treatment given today per MD orders. Tolerated infusion without adverse affects. Vital signs stable. 5FU pump infusing per protocol. No complaints at this time. Discharged from clinic ambulatory. F/U with [facility] as scheduled.

## 2018-03-27 NOTE — Patient Instructions (Signed)
[facility] Discharge Instructions for Patients Receiving Chemotherapy  Today you received the following chemotherapy agents   To help prevent nausea and vomiting after your treatment, we encourage you to take your nausea medication   If you develop nausea and vomiting that is not controlled by your nausea medication, call the clinic.   BELOW ARE SYMPTOMS THAT SHOULD BE REPORTED IMMEDIATELY:  *FEVER GREATER THAN 100.5 F  *CHILLS WITH OR WITHOUT FEVER  NAUSEA AND VOMITING THAT IS NOT CONTROLLED WITH YOUR NAUSEA MEDICATION  *UNUSUAL SHORTNESS OF BREATH  *UNUSUAL BRUISING OR BLEEDING  TENDERNESS IN MOUTH AND THROAT WITH OR WITHOUT PRESENCE OF ULCERS  *URINARY PROBLEMS  *BOWEL PROBLEMS  UNUSUAL RASH Items with * indicate a potential emergency and should be followed up as soon as possible.  Feel free to call the clinic should you have any questions or concerns. The clinic phone number is [phone].  Please show the CHEMO ALERT CARD at check-in to the Emergency Department and triage nurse.   

## 2018-03-29 ENCOUNTER — Encounter (HOSPITAL_COMMUNITY): Payer: Self-pay

## 2018-03-29 ENCOUNTER — Inpatient Hospital Stay (HOSPITAL_COMMUNITY): Payer: Medicaid Other

## 2018-03-29 VITALS — BP 124/69 | HR 99 | Temp 97.9°F | Resp 18

## 2018-03-29 DIAGNOSIS — C184 Malignant neoplasm of transverse colon: Secondary | ICD-10-CM

## 2018-03-29 DIAGNOSIS — Z5111 Encounter for antineoplastic chemotherapy: Secondary | ICD-10-CM | POA: Diagnosis not present

## 2018-03-29 DIAGNOSIS — C188 Malignant neoplasm of overlapping sites of colon: Secondary | ICD-10-CM

## 2018-03-29 MED ORDER — SODIUM CHLORIDE 0.9% FLUSH
10.0000 mL | INTRAVENOUS | Status: DC | PRN
  Administered 2018-03-29: 10 mL
  Filled 2018-03-29: qty 10

## 2018-03-29 MED ORDER — HEPARIN SOD (PORK) LOCK FLUSH 100 UNIT/ML IV SOLN
500.0000 [IU] | Freq: Once | INTRAVENOUS | Status: AC | PRN
  Administered 2018-03-29: 500 [IU]

## 2018-03-29 NOTE — Progress Notes (Signed)
Erik [name] returns today for port de access and flush after 46 hr continous infusion of 5fu. Tolerated infusion without problems. Portacath located left chest wall was  deaccessed and flushed with 20ml NS and 500U/5ml Heparin and needle removed intact.  Procedure without incident. Patient tolerated procedure well.  Vitals stable and discharged home from clinic ambulatory. Follow up as scheduled.    

## 2018-03-29 NOTE — Patient Instructions (Signed)
[facility] at [facility]  Discharge Instructions:   _______________________________________________________________  Thank you for choosing [facility] at [facility] to provide your oncology and hematology care.  To afford each patient quality time with our providers, please arrive at least 15 minutes before your scheduled appointment.  You need to re-schedule your appointment if you arrive 10 or more minutes late.  We strive to give you quality time with our providers, and arriving late affects you and other patients whose appointments are after yours.  Also, if you no show three or more times for appointments you may be dismissed from the clinic.  Again, thank you for choosing [facility] at [facility]. Our hope is that these requests will allow you access to exceptional care and in a timely manner. _______________________________________________________________  If you have questions after your visit, please contact our office at [phone] between the hours of 8:30 a.m. and 5:00 p.m. Voicemails left after 4:30 p.m. will not be returned until the following business day. _______________________________________________________________  For prescription refill requests, have your pharmacy contact our office. _______________________________________________________________  Recommendations made by the consultant and any test results will be sent to your referring physician. _______________________________________________________________ 

## 2018-04-10 ENCOUNTER — Other Ambulatory Visit (HOSPITAL_COMMUNITY): Payer: Self-pay | Admitting: Hematology

## 2018-04-10 ENCOUNTER — Inpatient Hospital Stay (HOSPITAL_COMMUNITY): Payer: Medicaid Other

## 2018-04-10 ENCOUNTER — Other Ambulatory Visit: Payer: Self-pay

## 2018-04-10 ENCOUNTER — Encounter (HOSPITAL_COMMUNITY): Payer: Self-pay | Admitting: Internal Medicine

## 2018-04-10 ENCOUNTER — Other Ambulatory Visit (HOSPITAL_COMMUNITY): Payer: Self-pay | Admitting: Internal Medicine

## 2018-04-10 ENCOUNTER — Inpatient Hospital Stay (HOSPITAL_COMMUNITY): Payer: Medicaid Other | Attending: Internal Medicine | Admitting: Internal Medicine

## 2018-04-10 VITALS — BP 151/72 | HR 94 | Temp 97.8°F | Resp 18

## 2018-04-10 VITALS — BP 137/83 | HR 97 | Temp 98.0°F | Resp 18 | Wt 240.2 lb

## 2018-04-10 DIAGNOSIS — Z5112 Encounter for antineoplastic immunotherapy: Secondary | ICD-10-CM | POA: Diagnosis present

## 2018-04-10 DIAGNOSIS — I10 Essential (primary) hypertension: Secondary | ICD-10-CM | POA: Diagnosis not present

## 2018-04-10 DIAGNOSIS — C189 Malignant neoplasm of colon, unspecified: Secondary | ICD-10-CM

## 2018-04-10 DIAGNOSIS — C188 Malignant neoplasm of overlapping sites of colon: Secondary | ICD-10-CM | POA: Diagnosis present

## 2018-04-10 DIAGNOSIS — Z87891 Personal history of nicotine dependence: Secondary | ICD-10-CM

## 2018-04-10 DIAGNOSIS — G620 Drug-induced polyneuropathy: Secondary | ICD-10-CM

## 2018-04-10 DIAGNOSIS — R21 Rash and other nonspecific skin eruption: Secondary | ICD-10-CM | POA: Diagnosis not present

## 2018-04-10 DIAGNOSIS — C772 Secondary and unspecified malignant neoplasm of intra-abdominal lymph nodes: Principal | ICD-10-CM

## 2018-04-10 DIAGNOSIS — T451X5A Adverse effect of antineoplastic and immunosuppressive drugs, initial encounter: Secondary | ICD-10-CM | POA: Insufficient documentation

## 2018-04-10 DIAGNOSIS — C184 Malignant neoplasm of transverse colon: Secondary | ICD-10-CM

## 2018-04-10 DIAGNOSIS — R911 Solitary pulmonary nodule: Secondary | ICD-10-CM | POA: Diagnosis not present

## 2018-04-10 DIAGNOSIS — Z5111 Encounter for antineoplastic chemotherapy: Secondary | ICD-10-CM | POA: Diagnosis not present

## 2018-04-10 LAB — CBC WITH DIFFERENTIAL/PLATELET
Abs Immature Granulocytes: 0.01 10*3/uL (ref 0.00–0.07)
Basophils Absolute: 0 10*3/uL (ref 0.0–0.1)
Basophils Relative: 1 %
Eosinophils Absolute: 0.1 10*3/uL (ref 0.0–0.5)
Eosinophils Relative: 2 %
HEMATOCRIT: 36 % — AB (ref 39.0–52.0)
Hemoglobin: 11.3 g/dL — ABNORMAL LOW (ref 13.0–17.0)
Immature Granulocytes: 0 %
Lymphocytes Relative: 31 %
Lymphs Abs: 1.2 10*3/uL (ref 0.7–4.0)
MCH: 31.4 pg (ref 26.0–34.0)
MCHC: 31.4 g/dL (ref 30.0–36.0)
MCV: 100 fL (ref 80.0–100.0)
MONOS PCT: 8 %
Monocytes Absolute: 0.3 10*3/uL (ref 0.1–1.0)
Neutro Abs: 2.2 10*3/uL (ref 1.7–7.7)
Neutrophils Relative %: 58 %
Platelets: 193 10*3/uL (ref 150–400)
RBC: 3.6 MIL/uL — ABNORMAL LOW (ref 4.22–5.81)
RDW: 16.5 % — AB (ref 11.5–15.5)
WBC: 3.9 10*3/uL — ABNORMAL LOW (ref 4.0–10.5)
nRBC: 0 % (ref 0.0–0.2)

## 2018-04-10 LAB — COMPREHENSIVE METABOLIC PANEL
ALT: 16 U/L (ref 0–44)
AST: 20 U/L (ref 15–41)
Albumin: 3.5 g/dL (ref 3.5–5.0)
Alkaline Phosphatase: 64 U/L (ref 38–126)
Anion gap: 8 (ref 5–15)
BUN: 11 mg/dL (ref 6–20)
CO2: 25 mmol/L (ref 22–32)
Calcium: 8.9 mg/dL (ref 8.9–10.3)
Chloride: 106 mmol/L (ref 98–111)
Creatinine, Ser: 0.92 mg/dL (ref 0.61–1.24)
GFR calc Af Amer: >60 mL/min (ref >60)
GFR calc non Af Amer: >60 mL/min (ref >60)
Glucose, Bld: 113 mg/dL — ABNORMAL HIGH (ref 70–99)
POTASSIUM: 4.2 mmol/L (ref 3.5–5.1)
Sodium: 139 mmol/L (ref 135–145)
Total Bilirubin: 0.4 mg/dL (ref 0.3–1.2)
Total Protein: 6.6 g/dL (ref 6.5–8.1)

## 2018-04-10 LAB — URINALYSIS, DIPSTICK ONLY
Bilirubin Urine: NEGATIVE
GLUCOSE, UA: NEGATIVE mg/dL
Hgb urine dipstick: NEGATIVE
Ketones, ur: NEGATIVE mg/dL
Leukocytes,Ua: NEGATIVE
Nitrite: NEGATIVE
PROTEIN: NEGATIVE mg/dL
Specific Gravity, Urine: 1.019 (ref 1.005–1.030)
pH: 5 (ref 5.0–8.0)

## 2018-04-10 LAB — LACTATE DEHYDROGENASE: LDH: 132 U/L (ref 98–192)

## 2018-04-10 MED ORDER — SODIUM CHLORIDE 0.9% FLUSH
10.0000 mL | INTRAVENOUS | Status: DC | PRN
  Administered 2018-04-10: 10 mL
  Filled 2018-04-10: qty 10

## 2018-04-10 MED ORDER — SODIUM CHLORIDE 0.9 % IV SOLN
Freq: Once | INTRAVENOUS | Status: AC
  Administered 2018-04-10: 10:00:00 via INTRAVENOUS

## 2018-04-10 MED ORDER — FLUOROURACIL CHEMO INJECTION 2.5 GM/50ML
400.0000 mg/m2 | Freq: Once | INTRAVENOUS | Status: AC
  Administered 2018-04-10: 950 mg via INTRAVENOUS
  Filled 2018-04-10: qty 19

## 2018-04-10 MED ORDER — SODIUM CHLORIDE 0.9 % IV SOLN
10.0000 mg | Freq: Once | INTRAVENOUS | Status: AC
  Administered 2018-04-10: 10 mg via INTRAVENOUS
  Filled 2018-04-10: qty 1

## 2018-04-10 MED ORDER — LEUCOVORIN CALCIUM INJECTION 350 MG
900.0000 mg | Freq: Once | INTRAVENOUS | Status: AC
  Administered 2018-04-10: 900 mg via INTRAVENOUS
  Filled 2018-04-10: qty 35

## 2018-04-10 MED ORDER — PALONOSETRON HCL INJECTION 0.25 MG/5ML
INTRAVENOUS | Status: AC
  Filled 2018-04-10: qty 5

## 2018-04-10 MED ORDER — ATROPINE SULFATE 1 MG/ML IJ SOLN
0.5000 mg | Freq: Once | INTRAMUSCULAR | Status: AC
  Administered 2018-04-10: 0.5 mg via INTRAVENOUS
  Filled 2018-04-10: qty 1

## 2018-04-10 MED ORDER — SODIUM CHLORIDE 0.9 % IV SOLN: 10.0000 mg | Freq: Once | INTRAVENOUS | Status: DC

## 2018-04-10 MED ORDER — SODIUM CHLORIDE 0.9 % IV SOLN
500.0000 mg | Freq: Once | INTRAVENOUS | Status: AC
  Administered 2018-04-10: 500 mg via INTRAVENOUS
  Filled 2018-04-10: qty 16

## 2018-04-10 MED ORDER — PALONOSETRON HCL INJECTION 0.25 MG/5ML
0.2500 mg | Freq: Once | INTRAVENOUS | Status: AC
  Administered 2018-04-10: 0.25 mg via INTRAVENOUS
  Filled 2018-04-10: qty 5

## 2018-04-10 MED ORDER — SODIUM CHLORIDE 0.9 % IV SOLN
2400.0000 mg/m2 | INTRAVENOUS | Status: DC
  Administered 2018-04-10: 5650 mg via INTRAVENOUS
  Filled 2018-04-10: qty 100

## 2018-04-10 MED ORDER — IRINOTECAN HCL CHEMO INJECTION 100 MG/5ML
180.0000 mg/m2 | Freq: Once | INTRAVENOUS | Status: AC
  Administered 2018-04-10: 420 mg via INTRAVENOUS
  Filled 2018-04-10: qty 2

## 2018-04-10 NOTE — Progress Notes (Signed)
Pt presents today for Folfiri/ Avastin. UA obtained and sent the lab. VSS. Pt seen by Dr. Walden Field. Labs reviewed. Proceed with treatment per MD.   Treatment given today per MD orders. Tolerated infusion without adverse affects. 5 FU pump initiated and infusing per protocol.  Vital signs stable. No complaints at this time. Discharged from clinic ambulatory. F/U with [facility] as scheduled.

## 2018-04-10 NOTE — Patient Instructions (Signed)
[facility] at [facility]  Discharge Instructions: You saw Dr. Higgs today                               _______________________________________________________________  Thank you for choosing [facility] at [facility] to provide your oncology and hematology care.  To afford each patient quality time with our providers, please arrive at least 15 minutes before your scheduled appointment.  You need to re-schedule your appointment if you arrive 10 or more minutes late.  We strive to give you quality time with our providers, and arriving late affects you and other patients whose appointments are after yours.  Also, if you no show three or more times for appointments you may be dismissed from the clinic.  Again, thank you for choosing [facility]. Our hope is that these requests will allow you access to exceptional care and in a timely manner. _______________________________________________________________  If you have questions after your visit, please contact our office at [phone] between the hours of 8:30 a.m. and 5:00 p.m. Voicemails left after 4:30 p.m. will not be returned until the following business day. _______________________________________________________________  For prescription refill requests, have your pharmacy contact our office. _______________________________________________________________  Recommendations made by the consultant and any test results will be sent to your referring physician. _______________________________________________________________ 

## 2018-04-10 NOTE — Progress Notes (Signed)
Diagnosis No diagnosis found.  Staging Cancer Staging Malignant neoplasm of transverse colon [facility]) Staging form: Colon and Rectum, AJCC 7th Edition - Pathologic stage from 03/04/2015: Stage IIIC (T4a, N2a, cM0) - Signed by Baird Cancer, PA-C on 03/04/2015 - Pathologic: M1 - Signed by Zoila Shutter, MD on 01/23/2018   Assessment and Plan:   1. Stage IV CRC.  Pt was initially diagnosed with Stage IIIC adenocarcinoma of transverse colon.  63 yr old male previously followed by Dr. Talbert Cage.  He was diagnosed in 01/2015 on screening colonoscopy.  CEA elevated at time of diagnosis at 6.6. Treated with definitve surgery with right hemicolectomy by Dr. Adonis Huguenin on 02/12/15. Went on to have adjuvant chemo with FOLFOX x 12 cycles; chemo course was complicated by progressive peripheral neuropathy and thus Oxaliplatin was dose-reduced for cycles #6-11, and then d/c'd for cycle #12.   Pt had colonoscopy in 01/2016 with Dr. Oneida Alar. Path revealed 1 hyperplastic polyp.  No evidence of malignancy.    Pt was recommended for CT imaging but has not had that performed due to inability to get scan approved.    CT chest, abdomen and pelvis was recommended as his last imaging was in 10/2016 for ongoing assessment due to symptoms.    Pt had CT CAP done 10/23/2017 showed  IMPRESSION: 1. Subtle but new nodularity along the upper omentum along with nodular density in the pelvic mesentery adjacent to the sigmoid colon, concerning for peritoneal and omental implants of tumor. 2. Mild wall thickening in the sigmoid colon could reflect a low-grade colitis. 3. Trace perihepatic ascites. 4. Other imaging findings of potential clinical significance: Aortic Atherosclerosis (ICD10-I70.0) and Emphysema (ICD10-J43.9). Coronary atherosclerosis. Airway thickening is present, suggesting bronchitis or reactive airways disease. Degenerative right glenohumeral arthropathy with free osteochondral fragment in the subscapular recess.  Nonobstructive single bilateral punctate renal calculi. Prior right hemicolectomy. Mild prostatomegaly. Mild impingement at L4-5 and L5-S1.  Pt was recommended for PET scan which was done on 11/19/2017 that showed  IMPRESSION: 1. Similar to 10/23/2017 is progressive peritoneal nodularity worrisome for peritoneal carcinomatosis. This appears new when compared with 10/19/2016. 2. Increase in size of right paratracheal lymph node with mild FDG uptake. Nonspecific. There is also mild nonspecific increased uptake within the right hilar region. 3. Reticulonodular densities identified within both lungs with an upper lobe predominance are favored to represent postinflammatory changes. 4. Aortic Atherosclerosis (ICD10-I70.0) and Emphysema (ICD10-J43.9). 5. Coronary artery atherosclerotic calcifications.  Labs done 11/16/2017 showed CEA elevated at 12.9.  Previously, Scan discussed with Dr. Oneida Alar and IR.  Dr. Markus Daft  reviewed CT scan and pt was referred to IR for review for possible CT biopsy of omental lesions.   Biopsy was attempted on 11/30/2017 but was unsuccessful.    Pt was referred to Dr. Arnoldo Morale of surgery and underwent peritoneal biopsy of implants on 12/19/2017 with pathology returning as adenocarcinoma consistent with prior CRC.    Previously, I discussed with pt multicenter trials have shown benefit of standard regimen of FolFIRI  in pts who have progressed after prior exposure to Oxaliplatin.  Goals of therapy discussed which is disease control.    Pt has completed C1 of Folfiri.  Foundation testing reviewed showed evidence of wild type KRAS.  Previously I discussed randomized trial of vectibix plus FOLFIRI versus FOLFIRI alone after failure of initial FU-containing chemotherapy (two-thirds prior oxaliplatin, 20 percent prior bevacizumab) showed that, in the KRAS wild-type group (n = 597), the addition of panitumumab was associated with a significant  improvement in response rate (35  versus 10 percent) and median PFS (5.9 versus 3.9 months).  Overall regimen is well tolerated other than known toxicities associated with anti- EGFR therapy.    Vectibix was dosed at 6 mg/kg IV every 2 weeks with FolFiri with 45f dosed at 400 mg/m2 with 5FU 2400 mg/m2 CIVI over 46 hours, Leucovorin 400 mg/m2, Irinotecan 180 mg/m2.  Treatment planned for every 2 weeks.   Pt developed rash consistent with Vectibix therapy.  He was previously recommended for Doxycycline 100 mg po bid, Clindamycin gel and hydrocortisone cream.  He reports clindamycin gel causes burning and does not want to use it.   Treatment was held as pt was not sure he wanted to continue Vectibix therapy due to rash.  Previously, he was  advised to use acne face wash to face and chest area, and to continue doxycycline and HC cream.    Despite discussions regarding recommendations for anti-EGFR therapy due to wild type KRAS.  He does not desire to continue with Vectibix. Discussion held regarding Avastin therapy in metastatic setting with Folfiri.     Pt is here for evaluation prior to C7 of Folfiri and C3 of Avastin.   Labs done 04/10/2018 reviewed and showed WBC 3.9 HB 11.3 plts 193,000.  Chemistries wNl with K+ 4.2 Cr 0.92 and normal LFTs.  UA negative.  Pt will continue therapy every 2 weeks and will be seen for follow-up in 1 month.  Anticipate repeat imaging in April or May 2020.    2.  Rash. Findings consistent with Vectibix therapy.  He was previously recommended for Doxycycline 100 mg po bid, Clindamycin gel and hydrocortisone cream.  He reports clindamycin gel causes burning and does not want to use it.  He is advised to use acne face wash to face and chest area, and to continue doxycycline and HC cream.  Rash on face improved today.  Pt does not desire to continue Vectibix.  Pt advised to discontinue steroid and abx as pt no longer taking Vectibix and rash is improved.  .    3.  LUL pulmonary nodule/ Right paratracheal LN.  Pt  had 6 mm nodule on scan done 04/2016.  CT chest done 10/23/2017 showed stable nodule.  Pet scan done 11/19/2017 shows paratracheal LN is nonspecific and measures 9 mm.  Pt will have repeat imaging in April or May 2020.    4  HTN.  BP is 137/83.  Follow-up with PCP.    5.  Smoking.  Cessation is recommended.    6.  Neuropathy.  Continue neurontin.    7.  Bedbugs.  This was reported previously by nursing.  Pt reports he had treatment to apartment done.    25 minutes spent with more than 50% spent in counseling and coordination of care.    Interval History:  From TKirby Crigler PA-C's last note on 06/14/16)    Current Status:  Pt is seen today for follow-up prior to C7 of Folfiri and C3 of Avastin.  He feels rash has improved    Oncology History   Stage IIIC (([id number] adenocarcinoma of transverse colon, diagnosed on colonoscopy by Dr. FOneida Alaron 01/12/2015 followed by definitive surgery by Dr. CClayburn Pertwith right hemicolectomy on 02/12/2015.  He then underwent FOLFOX x 12 cycles in the adjuvant setting (03/30/2015- 09/06/2015).     Malignant neoplasm of transverse colon ([place]   01/12/2015 Pathologic Stage    Colon, biopsy, distal transverse - TUBULOVILLOUS ADENOMA WITH HIGH  GRADE DYSPLASIA.    01/12/2015 Procedure    Colonoscopy by Dr. Oneida Alar.    01/12/2015 Tumor Marker    CEA: 6.6 (H)     01/18/2015 Imaging    CT abd/pelvis- Apple-core lesion identified in the mid transverse colon without obstruction. No evidence for lymphadenopathy in the gastrohepatic ligament or omentum.  Stable 8 mm hypo attenuating lesion in the left liver, likely a cyst.    02/10/2015 Initial Diagnosis    Adenocarcinoma of transverse colon ([place])    02/12/2015 Definitive Surgery    [name], Extended right hemicolectomy     02/12/2015 Pathology Results    Mucinous adenocarcinoma with penetration of visceral peritoneum, 4/19 lymph nodes for metastatic disease, negative resection margins, with LVI and  perineural invasion    03/30/2015 - 09/06/2015 Chemotherapy    FOLFOX x 12 cycles    05/25/2015 Treatment Plan Change    5 FU bolus discontinued for cycle #5    06/08/2015 Treatment Plan Change    Treatment deferred x 1 week    06/15/2015 Treatment Plan Change    5FU CI decreased by 10% and Oxaliplatin reduced by 15% for cycles #6-#11; Oxaliplatin dropped for cycle #12 d/t neuropathy.     10/20/2015 Imaging    CT CAP- Right hemicolectomy without evidence of metastatic disease. 2. Previously measured ground-glass lesion in the left upper lobe has resolved. 3. Probable food debris in the stomach, simulating gastric wall thickening. Please correlate clinically. 4. 6 mm irregular nodular density in the left upper lobe, stable. Continued attention on followup exams is warranted.    01/25/2016 Procedure    Colonoscopy by Dr. Oneida Alar- Non-thrombosed external hemorrhoids found on digital rectal exam. - One 4 mm polyp in the rectum, removed with a cold biopsy forceps. Resected and retrieved. - Congested mucosa in the neo-terminal ileum. Biopsied. - Redundant colon. - Internal hemorrhoids.    01/26/2016 Pathology Results    1. Terminal ileum, biopsy - MILD ACUTE (ACTIVE) ILEITIS. - NO DYSPLASIA OR MALIGNANCY IDENTIFIED - SEE COMMENT. 2. Rectum, polyp(s) - HYPERPLASTIC POLYP (X 1). - NO DYSPLASIA OR MALIGNANCY IDENTIFIED.    04/13/2016 Imaging    CT chest- Stable CT chest. 6 mm irregular nodule anterior left upper lobe is stable. The scattered areas of peribronchovascular micro nodularity in the lungs bilaterally are unchanged.    10/19/2016 Imaging    CT CAP: 1. Status post right hemicolectomy. No findings to suggest metastatic disease in the abdomen or pelvis. 2. Aortic atherosclerosis. 3. Additional incidental findings, as above. Aortic Atherosclerosis (ICD10-I70.0).    01/09/2018 -  Chemotherapy    The patient had palonosetron (ALOXI) injection 0.25 mg, 0.25 mg, Intravenous,  Once,  7 of 7 cycles Administration: 0.25 mg (01/09/2018), 0.25 mg (01/23/2018), 0.25 mg (02/11/2018), 0.25 mg (02/25/2018), 0.25 mg (03/12/2018), 0.25 mg (03/27/2018) irinotecan (CAMPTOSAR) 420 mg in dextrose 5 % 500 mL chemo infusion, 180 mg/m2 = 420 mg, Intravenous,  Once, 7 of 7 cycles Administration: 420 mg (01/09/2018), 420 mg (01/23/2018), 420 mg (02/11/2018), 420 mg (02/25/2018), 420 mg (03/12/2018), 420 mg (03/27/2018) leucovorin 900 mg in dextrose 5 % 250 mL infusion, 944 mg, Intravenous,  Once, 7 of 7 cycles Administration: 900 mg (01/09/2018), 900 mg (01/23/2018), 900 mg (02/11/2018), 900 mg (02/25/2018), 900 mg (03/12/2018), 900 mg (03/27/2018) fluorouracil (ADRUCIL) chemo injection 950 mg, 400 mg/m2 = 950 mg, Intravenous,  Once, 7 of 7 cycles Administration: 950 mg (01/09/2018), 950 mg (01/23/2018), 950 mg (02/11/2018), 950 mg (02/25/2018), 950 mg (03/12/2018), 950 mg (  03/27/2018) fluorouracil (ADRUCIL) 5,650 mg in sodium chloride 0.9 % 137 mL chemo infusion, 2,400 mg/m2 = 5,650 mg, Intravenous, 1 Day/Dose, 7 of 7 cycles Administration: 5,650 mg (01/09/2018), 5,650 mg (01/23/2018), 5,650 mg (02/11/2018), 5,650 mg (02/25/2018), 5,650 mg (03/12/2018), 5,650 mg (03/27/2018)  for chemotherapy treatment.     01/23/2018 Cancer Staging    Staging form: Colon and Rectum, AJCC 7th Edition - Pathologic: M1 - Signed by Zoila Shutter, MD on 01/23/2018     Malignant neoplasm of overlapping sites of colon ([place])   12/26/2017 Initial Diagnosis    Cancer of overlapping sites of colon metastatic to intra-abdominal lymph node ([place])    01/09/2018 -  Chemotherapy    The patient had palonosetron (ALOXI) injection 0.25 mg, 0.25 mg, Intravenous,  Once, 7 of 7 cycles Administration: 0.25 mg (01/09/2018), 0.25 mg (01/23/2018), 0.25 mg (02/11/2018), 0.25 mg (02/25/2018), 0.25 mg (03/12/2018), 0.25 mg (03/27/2018) irinotecan (CAMPTOSAR) 420 mg in dextrose 5 % 500 mL chemo infusion, 180 mg/m2 = 420 mg, Intravenous,  Once, 7 of 7 cycles Administration: 420  mg (01/09/2018), 420 mg (01/23/2018), 420 mg (02/11/2018), 420 mg (02/25/2018), 420 mg (03/12/2018), 420 mg (03/27/2018) leucovorin 900 mg in dextrose 5 % 250 mL infusion, 944 mg, Intravenous,  Once, 7 of 7 cycles Administration: 900 mg (01/09/2018), 900 mg (01/23/2018), 900 mg (02/11/2018), 900 mg (02/25/2018), 900 mg (03/12/2018), 900 mg (03/27/2018) fluorouracil (ADRUCIL) chemo injection 950 mg, 400 mg/m2 = 950 mg, Intravenous,  Once, 7 of 7 cycles Administration: 950 mg (01/09/2018), 950 mg (01/23/2018), 950 mg (02/11/2018), 950 mg (02/25/2018), 950 mg (03/12/2018), 950 mg (03/27/2018) fluorouracil (ADRUCIL) 5,650 mg in sodium chloride 0.9 % 137 mL chemo infusion, 2,400 mg/m2 = 5,650 mg, Intravenous, 1 Day/Dose, 7 of 7 cycles Administration: 5,650 mg (01/09/2018), 5,650 mg (01/23/2018), 5,650 mg (02/11/2018), 5,650 mg (02/25/2018), 5,650 mg (03/12/2018), 5,650 mg (03/27/2018)  for chemotherapy treatment.      Colon cancer metastasized to multiple sites [facility])   01/16/2018 Initial Diagnosis    Colon cancer metastasized to multiple sites [facility])    01/23/2018 - 02/10/2018 Chemotherapy    The patient had panitumumab (VECTIBIX) 600 mg in sodium chloride 0.9 % 100 mL chemo infusion, 640 mg, Intravenous,  Once, 1 of 3 cycles Administration: 600 mg (01/23/2018)  for chemotherapy treatment.     03/12/2018 -  Chemotherapy    The patient had bevacizumab (AVASTIN) 500 mg in sodium chloride 0.9 % 100 mL chemo infusion, 525 mg, Intravenous,  Once, 3 of 6 cycles Administration: 500 mg (03/12/2018), 500 mg (03/27/2018)  for chemotherapy treatment.       Problem List Patient Active Problem List   Diagnosis Date Noted  . Colon cancer metastasized to multiple sites [facility]) [C18.9] 01/16/2018  . Malignant neoplasm of overlapping sites of colon ([place]) [C18.8] 12/26/2017  . S/P exploratory laparotomy [Z98.890] 12/19/2017  . Carcinomatosis ([place]) [C80.0]   . Psoriasis [L40.9] 09/12/2016  . Pulmonary nodule [R91.1] 06/14/2016  . History of  colon cancer [Z85.038]   . Diarrhea [R19.7] 01/06/2016  . Cigarette nicotine dependence with nicotine-induced disorder [F17.219] 06/18/2015  . Chronic obstructive pulmonary disease ([place]) [J44.9] 06/18/2015  . Depression [F32.9] 06/18/2015  . B12 deficiency [E53.8] 04/14/2015  . Malignant neoplasm of transverse colon ([place]) [C18.4] 02/10/2015  . GERD (gastroesophageal reflux disease) [K21.9] 12/15/2014  . History of colonic polyps [Z86.010] 12/15/2014  . History of alcohol abuse [F10.11] 09/16/2014  . Panic disorder [F41.0] 10/10/2012  . Major depressive disorder, single episode [F32.9] 10/10/2012  . Bilateral  lower extremity edema [R60.0] 04/24/2012  . Hyperlipidemia [E78.5] 04/21/2009  . CAD, NATIVE VESSEL [I25.10] 03/25/2009  . HEAD TRAUMA [S09.90XA] 03/02/2009    Past Medical History Past Medical History:  Diagnosis Date  . Abnormal stress echocardiogram   . Adenocarcinoma of transverse colon ([place]) 02/10/2015  . Alcohol abuse    Heavy Use up until 2010  . Anxiety   . Blood transfusion without reported diagnosis   . Cancer of overlapping sites of colon metastatic to intra-abdominal lymph node ([place]) 12/26/2017  . COPD (chronic obstructive pulmonary disease) ([place])   . Depression   . Emphysema of lung ([place])   . GERD (gastroesophageal reflux disease)   . Head trauma 2001   closed head injury; coma for 4 weeks  . Hypercholesterolemia   . Hypertension   . ING HERN W/GANGREN RECUR UNILAT/UNSPEC ING HERN 04/21/2009   Qualifier: Diagnosis of  By: Verl Blalock, MD, Delanna Ahmadi PTSD (post-traumatic stress disorder)   . Pulmonary nodule 06/14/2016  . SAH (subarachnoid hemorrhage) ([place]) 08/31/2012  . SDH (subdural hematoma) ([place]) 08/31/2012    Past Surgical History Past Surgical History:  Procedure Laterality Date  . BIOPSY  01/25/2016   Procedure: BIOPSY;  Surgeon: Danie Binder, MD;  [facility];  Service: Endoscopy;;  ileum;   . cardiac cath    . COLONOSCOPY  2011   Dr.  Oneida Alar: multiple adenomas and hyperplastic polyps  . COLONOSCOPY WITH PROPOFOL N/A 01/12/2015   Procedure: COLONOSCOPY WITH PROPOFOL;  Surgeon: Danie Binder, MD;  [facility];  Service: Endoscopy;  Laterality: N/A;  1030  . COLONOSCOPY WITH PROPOFOL N/A 01/25/2016   Procedure: COLONOSCOPY WITH PROPOFOL;  Surgeon: Danie Binder, MD;  [facility];  Service: Endoscopy;  Laterality: N/A;  1230  . CRANIOTOMY  2001  . ESOPHAGOGASTRODUODENOSCOPY (EGD) WITH PROPOFOL N/A 01/12/2015   Procedure: ESOPHAGOGASTRODUODENOSCOPY (EGD) WITH PROPOFOL;  Surgeon: Danie Binder, MD;  [facility];  Service: Endoscopy;  Laterality: N/A;  . head injury surgery    . HERNIA REPAIR Right 2012   Inguinal- [place]  . KIDNEY SURGERY     >30 years ago  . LAPAROSCOPIC RIGHT HEMI COLECTOMY Left 02/10/2015   Procedure: LAPAROSCOPIC THEN OPEN RIGHT HEMI COLECTOMY;  Surgeon: [name], MD;  Location: [facility] ORS;  Service: General;  Laterality: Left;  . LAPAROTOMY N/A 12/19/2017   Procedure: EXPLORATORY LAPAROTOMY;  Surgeon: Aviva Signs, MD;  Location: [facility];  Service: General;  Laterality: N/A;  . POLYPECTOMY  01/25/2016   Procedure: POLYPECTOMY;  Surgeon: Danie Binder, MD;  [facility];  Service: Endoscopy;;  colon  . PORT-A-CATH REMOVAL N/A 02/26/2017   Procedure: MINOR REMOVAL PORT-A-CATH;  Surgeon: Aviva Signs, MD;  Location: [facility];  Service: General;  Laterality: N/A;  Pt to arrive at [place] N/A 03/24/2015   Procedure: INSERTION PORT-A-CATH;  Surgeon: Jules Husbands, MD;  Location: [facility] ORS;  Service: General;  Laterality: N/A;  . PORTACATH PLACEMENT Left 01/07/2018   Procedure: INSERTION PORT A CATH (ATTACHED CATHETER IN LEFT SUBCLAVIAN);  Surgeon: Aviva Signs, MD;  Location: [facility];  Service: General;  Laterality: Left;    Family History Family History  Problem Relation Age of Onset  . Pulmonary embolism Mother   . Breast cancer  Mother   . Cancer Mother        breast cancer  . Arthritis Mother   . Heart disease Father   .  Cancer Father 35       Leukemia  . Cancer Paternal Grandfather        Lung  . Ataxia Neg Hx   . Chorea Neg Hx   . Dementia Neg Hx   . Mental retardation Neg Hx   . Migraines Neg Hx   . Multiple sclerosis Neg Hx   . Neurofibromatosis Neg Hx   . Neuropathy Neg Hx   . Parkinsonism Neg Hx   . Seizures Neg Hx   . Stroke Neg Hx   . Colon cancer Neg Hx      Social History  reports that he quit smoking about 4 months ago. His smoking use included cigarettes. He started smoking about 45 years ago. He has a 3.00 pack-year smoking history. He has never used smokeless tobacco. He reports current alcohol use. He reports that he does not use drugs.  Medications  Current Outpatient Medications:  .  amitriptyline (ELAVIL) 25 MG tablet, TAKE 1 TABLET BY MOUTH EVERY NIGHT AT BEDTIME (Patient taking differently: Take 25 mg by mouth at bedtime as needed for sleep. ), Disp: 30 tablet, Rfl: 0 .  atorvastatin (LIPITOR) 20 MG tablet, Take 1 tablet (20 mg total) by mouth at bedtime. (Patient taking differently: Take 20 mg by mouth every morning. ), Disp: 90 tablet, Rfl: 3 .  buPROPion (WELLBUTRIN SR) 150 MG 12 hr tablet, Take 150 mg by mouth every morning. , Disp: , Rfl:  .  busPIRone (BUSPAR) 10 MG tablet, Take 10 mg by mouth 2 (two) times daily., Disp: , Rfl:  .  fluorouracil CALGB 22979 in sodium chloride 0.9 % 150 mL, Inject 5,650 mg into the vein. Over 46 hours, Disp: , Rfl:  .  FLUoxetine (PROZAC) 20 MG capsule, TAKE 1 CAPSULE BY MOUTH 2 TIMES A DAY (Patient taking differently: Take 20 mg by mouth 2 (two) times daily. ), Disp: 60 capsule, Rfl: 0 .  gabapentin (NEURONTIN) 300 MG capsule, Take 1 capsule (300 mg total) by mouth 3 (three) times daily., Disp: 120 capsule, Rfl: 0 .  IRINOTECAN HCL IV, Inject 420 mg into the vein every 14 (fourteen) days., Disp: , Rfl:  .  LEUCOVORIN CALCIUM IV, Inject 944 mg  into the vein every 14 (fourteen) days., Disp: , Rfl:  .  lidocaine (XYLOCAINE) 2 % solution, Use as directed 15 mLs in the mouth or throat every 4 (four) hours as needed for mouth pain (as an aid to eat and drink)., Disp: 240 mL, Rfl: 0 .  lidocaine-prilocaine (EMLA) cream, APPLY A SMALL AMOUNT OVER PORT SITE AND COVER WITH PLASTIC WRAP ONE HOUR PRIOR TO APPOINTMENT, Disp: 30 g, Rfl: 0 .  loperamide (IMODIUM) 2 MG capsule, TAKE 1 TO 2 CAPSULES BY MOUTH EVERY 6 HOURS AS NEEDED FOR DIARRHEA OR LOOSE STOOLS, Disp: 60 capsule, Rfl: 0 .  omeprazole (PRILOSEC) 20 MG capsule, Take 20 mg by mouth every morning. , Disp: , Rfl:  .  ondansetron (ZOFRAN) 8 MG tablet, Take 1 tablet (8 mg total) by mouth 2 (two) times daily as needed for refractory nausea / vomiting. Start on day 3 after chemotherapy., Disp: 30 tablet, Rfl: 1 .  PROAIR HFA 108 (90 Base) MCG/ACT inhaler, INHALE 2 PUFFS BY MOUTH EVERY 6 HOURS AS NEEDED FOR SHORTNESS OF BREATH/WHEEZING. (Patient taking differently: Inhale 2 puffs into the lungs every 6 (six) hours as needed for wheezing or shortness of breath. ), Disp: 18 each, Rfl: 2 .  prochlorperazine (COMPAZINE) 10 MG tablet, Take  1 tablet (10 mg total) by mouth every 6 (six) hours as needed (NAUSEA)., Disp: 30 tablet, Rfl: 1 .  triamcinolone ointment (KENALOG) 0.5 %, Apply 1 application topically 2 (two) times daily., Disp: 60 g, Rfl: 2 No current facility-administered medications for this visit.   Facility-Administered Medications Ordered in Other Visits:  .  fluorouracil (ADRUCIL) 5,650 mg in sodium chloride 0.9 % 137 mL chemo infusion, 2,400 mg/m2 (Treatment Plan Recorded), Intravenous, 1 day or 1 dose, Preet Perrier, MD .  fluorouracil (ADRUCIL) chemo injection 950 mg, 400 mg/m2 (Treatment Plan Recorded), Intravenous, Once, Sweta Halseth, MD .  irinotecan (CAMPTOSAR) 420 mg in dextrose 5 % 500 mL chemo infusion, 180 mg/m2 (Treatment Plan Recorded), Intravenous, Once, Azuri Bozard, MD, Last  Rate: 347 mL/hr at 04/10/18 1130, 420 mg at 04/10/18 1130 .  leucovorin 900 mg in dextrose 5 % 250 mL infusion, 900 mg, Intravenous, Once, Caden Fatica, MD, Last Rate: 197 mL/hr at 04/10/18 1132, 900 mg at 04/10/18 1132 .  sodium chloride flush (NS) 0.9 % injection 10 mL, 10 mL, Intracatheter, PRN, Beanca Kiester, MD, 10 mL at 04/10/18 1000  Allergies Patient has no known allergies.  Review of Systems Review of Systems - Oncology ROS negative   Physical Exam  Vitals Wt Readings from Last 3 Encounters:  04/10/18 240 lb 3.2 oz (109 kg)  03/27/18 228 lb 9.6 oz (103.7 kg)  03/12/18 240 lb 3.2 oz (109 kg)   Temp Readings from Last 3 Encounters:  04/10/18 98 F (36.7 C) (Oral)  03/29/18 97.9 F (36.6 C) (Oral)  03/27/18 97.6 F (36.4 C) (Oral)   BP Readings from Last 3 Encounters:  04/10/18 137/83  03/29/18 124/69  03/27/18 130/75   Pulse Readings from Last 3 Encounters:  04/10/18 97  03/29/18 99  03/27/18 98   Constitutional: Well-developed, well-nourished, and in no distress.   HENT: Head: Normocephalic and atraumatic.  Mouth/Throat: No oropharyngeal exudate. Mucosa moist. Eyes: Pupils are equal, round, and reactive to light. Conjunctivae are normal. No scleral icterus.  Neck: Normal range of motion. Neck supple. No JVD present.  Cardiovascular: Normal rate, regular rhythm and normal heart sounds.  Exam reveals no gallop and no friction rub.   No murmur heard. Pulmonary/Chest: Effort normal and breath sounds normal. No respiratory distress. No wheezes.No rales.  Abdominal: Soft. Bowel sounds are normal. No distension. There is no tenderness. There is no guarding.  Musculoskeletal: No edema or tenderness.  Lymphadenopathy: No cervical, axillary or supraclavicular adenopathy.  Neurological: Alert and oriented to person, place, and time. No cranial nerve deficit.  Skin: Skin is warm and dry. No rash noted. No erythema. No pallor.  Psychiatric: Affect and judgment normal.    Labs Infusion on 04/10/2018  Component Date Value Ref Range Status  . Color, Urine 04/10/2018 YELLOW  YELLOW Final  . APPearance 04/10/2018 CLEAR  CLEAR Final  . Specific Gravity, Urine 04/10/2018 1.019  1.005 - 1.030 Final  . pH 04/10/2018 5.0  5.0 - 8.0 Final  . Glucose, UA 04/10/2018 NEGATIVE  NEGATIVE mg/dL Final  . Hgb urine dipstick 04/10/2018 NEGATIVE  NEGATIVE Final  . Bilirubin Urine 04/10/2018 NEGATIVE  NEGATIVE Final  . Ketones, ur 04/10/2018 NEGATIVE  NEGATIVE mg/dL Final  . Protein, ur 04/10/2018 NEGATIVE  NEGATIVE mg/dL Final  . Nitrite 04/10/2018 NEGATIVE  NEGATIVE Final  . Chalmers Guest 04/10/2018 NEGATIVE  NEGATIVE Final   Performed at [facility], [address], [place], [place] [address]  Appointment on 04/10/2018  Component Date Value  Ref Range Status  . WBC 04/10/2018 3.9* 4.0 - 10.5 K/uL Final  . RBC 04/10/2018 3.60* 4.22 - 5.81 MIL/uL Final  . Hemoglobin 04/10/2018 11.3* 13.0 - 17.0 g/dL Final  . HCT 04/10/2018 36.0* 39.0 - 52.0 % Final  . MCV 04/10/2018 100.0  80.0 - 100.0 fL Final  . MCH 04/10/2018 31.4  26.0 - 34.0 pg Final  . MCHC 04/10/2018 31.4  30.0 - 36.0 g/dL Final  . RDW 04/10/2018 16.5* 11.5 - 15.5 % Final  . Platelets 04/10/2018 193  150 - 400 K/uL Final  . nRBC 04/10/2018 0.0  0.0 - 0.2 % Final  . Neutrophils Relative % 04/10/2018 58  % Final  . Neutro Abs 04/10/2018 2.2  1.7 - 7.7 K/uL Final  . Lymphocytes Relative 04/10/2018 31  % Final  . Lymphs Abs 04/10/2018 1.2  0.7 - 4.0 K/uL Final  . Monocytes Relative 04/10/2018 8  % Final  . Monocytes Absolute 04/10/2018 0.3  0.1 - 1.0 K/uL Final  . Eosinophils Relative 04/10/2018 2  % Final  . Eosinophils Absolute 04/10/2018 0.1  0.0 - 0.5 K/uL Final  . Basophils Relative 04/10/2018 1  % Final  . Basophils Absolute 04/10/2018 0.0  0.0 - 0.1 K/uL Final  . Immature Granulocytes 04/10/2018 0  % Final  . Abs Immature Granulocytes 04/10/2018 0.01  0.00 - 0.07 K/uL Final   Performed at [facility], [address] [place], [place] [address]  . Sodium 04/10/2018 139  135 - 145 mmol/L Final  . Potassium 04/10/2018 4.2  3.5 - 5.1 mmol/L Final  . Chloride 04/10/2018 106  98 - 111 mmol/L Final  . CO2 04/10/2018 25  22 - 32 mmol/L Final  . Glucose, Bld 04/10/2018 113* 70 - 99 mg/dL Final  . BUN 04/10/2018 11  6 - 20 mg/dL Final  . Creatinine, Ser 04/10/2018 0.92  0.61 - 1.24 mg/dL Final  . Calcium 04/10/2018 8.9  8.9 - 10.3 mg/dL Final  . Total Protein 04/10/2018 6.6  6.5 - 8.1 g/dL Final  . Albumin 04/10/2018 3.5  3.5 - 5.0 g/dL Final  . AST 04/10/2018 20  15 - 41 U/L Final  . ALT 04/10/2018 16  0 - 44 U/L Final  . Alkaline Phosphatase 04/10/2018 64  38 - 126 U/L Final  . Total Bilirubin 04/10/2018 0.4  0.3 - 1.2 mg/dL Final  . GFR calc non Af Amer 04/10/2018 >60  >60 mL/min Final  . GFR calc Af Amer 04/10/2018 >60  >60 mL/min Final  . Anion gap 04/10/2018 8  5 - 15 Final   Performed at [facility], [address] [place], [place] [address]  . LDH 04/10/2018 132  98 - 192 U/L Final   Performed at [facility], [address] [place],  [address]     Pathology No orders of the defined types were placed in this encounter.      Zoila Shutter MD

## 2018-04-10 NOTE — Patient Instructions (Signed)
[facility] at [facility]  Discharge Instructions:   _______________________________________________________________  Thank you for choosing [facility] at [facility] to provide your oncology and hematology care.  To afford each patient quality time with our providers, please arrive at least 15 minutes before your scheduled appointment.  You need to re-schedule your appointment if you arrive 10 or more minutes late.  We strive to give you quality time with our providers, and arriving late affects you and other patients whose appointments are after yours.  Also, if you no show three or more times for appointments you may be dismissed from the clinic.  Again, thank you for choosing [facility] at [facility]. Our hope is that these requests will allow you access to exceptional care and in a timely manner. _______________________________________________________________  If you have questions after your visit, please contact our office at [phone] between the hours of 8:30 a.m. and 5:00 p.m. Voicemails left after 4:30 p.m. will not be returned until the following business day. _______________________________________________________________  For prescription refill requests, have your pharmacy contact our office. _______________________________________________________________  Recommendations made by the consultant and any test results will be sent to your referring physician. _______________________________________________________________ 

## 2018-04-12 ENCOUNTER — Inpatient Hospital Stay (HOSPITAL_COMMUNITY): Payer: Medicaid Other

## 2018-04-12 VITALS — BP 126/78 | HR 88 | Temp 97.8°F | Resp 18

## 2018-04-12 DIAGNOSIS — C184 Malignant neoplasm of transverse colon: Secondary | ICD-10-CM

## 2018-04-12 DIAGNOSIS — Z5111 Encounter for antineoplastic chemotherapy: Secondary | ICD-10-CM | POA: Diagnosis not present

## 2018-04-12 DIAGNOSIS — C188 Malignant neoplasm of overlapping sites of colon: Secondary | ICD-10-CM

## 2018-04-12 MED ORDER — HEPARIN SOD (PORK) LOCK FLUSH 100 UNIT/ML IV SOLN
500.0000 [IU] | Freq: Once | INTRAVENOUS | Status: AC | PRN
  Administered 2018-04-12: 500 [IU]

## 2018-04-12 MED ORDER — SODIUM CHLORIDE 0.9% FLUSH
10.0000 mL | INTRAVENOUS | Status: DC | PRN
  Administered 2018-04-12: 10 mL
  Filled 2018-04-12: qty 10

## 2018-04-12 MED ORDER — GABAPENTIN 300 MG PO CAPS: 300.0000 mg | ORAL_CAPSULE | Freq: Three times a day (TID) | ORAL | 0 refills | Status: DC

## 2018-04-12 NOTE — Progress Notes (Signed)
Patients chemo pump disconnected.  Port site clean and dry with good blood return noted.  No bruising or swelling noted at site.  Band aid applied.  VSs with discharge and left ambulatory with no s/s of distress noted.

## 2018-04-12 NOTE — Patient Instructions (Signed)
[facility] at [facility]  Discharge Instructions:   _______________________________________________________________  Thank you for choosing [facility] at [facility] to provide your oncology and hematology care.  To afford each patient quality time with our providers, please arrive at least 15 minutes before your scheduled appointment.  You need to re-schedule your appointment if you arrive 10 or more minutes late.  We strive to give you quality time with our providers, and arriving late affects you and other patients whose appointments are after yours.  Also, if you no show three or more times for appointments you may be dismissed from the clinic.  Again, thank you for choosing [facility] at [facility]. Our hope is that these requests will allow you access to exceptional care and in a timely manner. _______________________________________________________________  If you have questions after your visit, please contact our office at [phone] between the hours of 8:30 a.m. and 5:00 p.m. Voicemails left after 4:30 p.m. will not be returned until the following business day. _______________________________________________________________  For prescription refill requests, have your pharmacy contact our office. _______________________________________________________________  Recommendations made by the consultant and any test results will be sent to your referring physician. _______________________________________________________________ 

## 2018-04-16 ENCOUNTER — Telehealth: Payer: Self-pay | Admitting: Neurology

## 2018-04-16 NOTE — Telephone Encounter (Signed)
Patient called after hours service regarding having missed his last appointment due to his Cancer Treatment. He is unable to get his Amitriptyline medication refilled. The Pharmacy is saying they need a Prescription from Dr. Tomi Likens. Please Call. Thanks

## 2018-04-17 NOTE — Telephone Encounter (Signed)
As it has been over a year since I saw him, he really needs to be seen first before I can refill his medication.

## 2018-04-17 NOTE — Telephone Encounter (Signed)
Patient is sch for 05-02-18 at 9:50  Thank you  Hinton Dyer

## 2018-04-24 ENCOUNTER — Inpatient Hospital Stay (HOSPITAL_COMMUNITY): Payer: Medicaid Other

## 2018-04-24 ENCOUNTER — Encounter (HOSPITAL_COMMUNITY): Payer: Self-pay

## 2018-04-24 ENCOUNTER — Other Ambulatory Visit: Payer: Self-pay

## 2018-04-24 VITALS — BP 144/75 | HR 85 | Temp 98.2°F | Resp 18 | Wt 238.0 lb

## 2018-04-24 DIAGNOSIS — C188 Malignant neoplasm of overlapping sites of colon: Secondary | ICD-10-CM

## 2018-04-24 DIAGNOSIS — C184 Malignant neoplasm of transverse colon: Secondary | ICD-10-CM

## 2018-04-24 DIAGNOSIS — C189 Malignant neoplasm of colon, unspecified: Secondary | ICD-10-CM

## 2018-04-24 DIAGNOSIS — E538 Deficiency of other specified B group vitamins: Secondary | ICD-10-CM

## 2018-04-24 DIAGNOSIS — Z5111 Encounter for antineoplastic chemotherapy: Secondary | ICD-10-CM | POA: Diagnosis not present

## 2018-04-24 LAB — COMPREHENSIVE METABOLIC PANEL
ALBUMIN: 3.6 g/dL (ref 3.5–5.0)
ALT: 24 U/L (ref 0–44)
AST: 20 U/L (ref 15–41)
Alkaline Phosphatase: 67 U/L (ref 38–126)
Anion gap: 9 (ref 5–15)
BUN: 12 mg/dL (ref 6–20)
CO2: 27 mmol/L (ref 22–32)
Calcium: 8.8 mg/dL — ABNORMAL LOW (ref 8.9–10.3)
Chloride: 104 mmol/L (ref 98–111)
Creatinine, Ser: 0.72 mg/dL (ref 0.61–1.24)
GFR calc Af Amer: >60 mL/min (ref >60)
GFR calc non Af Amer: >60 mL/min (ref >60)
GLUCOSE: 113 mg/dL — AB (ref 70–99)
Potassium: 3.8 mmol/L (ref 3.5–5.1)
SODIUM: 140 mmol/L (ref 135–145)
Total Bilirubin: 0.6 mg/dL (ref 0.3–1.2)
Total Protein: 6.8 g/dL (ref 6.5–8.1)

## 2018-04-24 LAB — CBC WITH DIFFERENTIAL/PLATELET
Abs Immature Granulocytes: 0.04 10*3/uL (ref 0.00–0.07)
Basophils Absolute: 0 10*3/uL (ref 0.0–0.1)
Basophils Relative: 0 %
Eosinophils Absolute: 0 10*3/uL (ref 0.0–0.5)
Eosinophils Relative: 1 %
HCT: 37.1 % — ABNORMAL LOW (ref 39.0–52.0)
Hemoglobin: 11.9 g/dL — ABNORMAL LOW (ref 13.0–17.0)
Immature Granulocytes: 1 %
LYMPHS PCT: 25 %
Lymphs Abs: 1.5 10*3/uL (ref 0.7–4.0)
MCH: 32.8 pg (ref 26.0–34.0)
MCHC: 32.1 g/dL (ref 30.0–36.0)
MCV: 102.2 fL — ABNORMAL HIGH (ref 80.0–100.0)
Monocytes Absolute: 1 10*3/uL (ref 0.1–1.0)
Monocytes Relative: 17 %
NRBC: 0 % (ref 0.0–0.2)
Neutro Abs: 3.4 10*3/uL (ref 1.7–7.7)
Neutrophils Relative %: 56 %
Platelets: 166 10*3/uL (ref 150–400)
RBC: 3.63 MIL/uL — AB (ref 4.22–5.81)
RDW: 17.2 % — ABNORMAL HIGH (ref 11.5–15.5)
WBC: 5.9 10*3/uL (ref 4.0–10.5)

## 2018-04-24 LAB — LACTATE DEHYDROGENASE: LDH: 156 U/L (ref 98–192)

## 2018-04-24 MED ORDER — ATROPINE SULFATE 1 MG/ML IJ SOLN
0.5000 mg | Freq: Once | INTRAMUSCULAR | Status: AC
  Administered 2018-04-24: 0.5 mg via INTRAVENOUS
  Filled 2018-04-24: qty 1

## 2018-04-24 MED ORDER — PALONOSETRON HCL INJECTION 0.25 MG/5ML
INTRAVENOUS | Status: AC
  Filled 2018-04-24: qty 5

## 2018-04-24 MED ORDER — SODIUM CHLORIDE 0.9 % IV SOLN
4.8000 mg/kg | Freq: Once | INTRAVENOUS | Status: AC
  Administered 2018-04-24: 500 mg via INTRAVENOUS
  Filled 2018-04-24: qty 4

## 2018-04-24 MED ORDER — SODIUM CHLORIDE 0.9 % IV SOLN
Freq: Once | INTRAVENOUS | Status: AC
  Administered 2018-04-24: 09:00:00 via INTRAVENOUS

## 2018-04-24 MED ORDER — HEPARIN SOD (PORK) LOCK FLUSH 100 UNIT/ML IV SOLN
500.0000 [IU] | Freq: Once | INTRAVENOUS | Status: DC | PRN
  Filled 2018-04-24 (×2): qty 5

## 2018-04-24 MED ORDER — CYANOCOBALAMIN 1000 MCG/ML IJ SOLN
1000.0000 ug | Freq: Once | INTRAMUSCULAR | Status: AC
  Administered 2018-04-24: 1000 ug via INTRAMUSCULAR
  Filled 2018-04-24: qty 1

## 2018-04-24 MED ORDER — FLUOROURACIL CHEMO INJECTION 2.5 GM/50ML
400.0000 mg/m2 | Freq: Once | INTRAVENOUS | Status: AC
  Administered 2018-04-24: 950 mg via INTRAVENOUS
  Filled 2018-04-24: qty 19

## 2018-04-24 MED ORDER — LEUCOVORIN CALCIUM INJECTION 350 MG
900.0000 mg | Freq: Once | INTRAVENOUS | Status: AC
  Administered 2018-04-24: 900 mg via INTRAVENOUS
  Filled 2018-04-24: qty 25

## 2018-04-24 MED ORDER — SODIUM CHLORIDE 0.9 % IV SOLN
2400.0000 mg/m2 | INTRAVENOUS | Status: DC
  Administered 2018-04-24: 5650 mg via INTRAVENOUS
  Filled 2018-04-24: qty 113

## 2018-04-24 MED ORDER — IRINOTECAN HCL CHEMO INJECTION 100 MG/5ML
180.0000 mg/m2 | Freq: Once | INTRAVENOUS | Status: AC
  Administered 2018-04-24: 420 mg via INTRAVENOUS
  Filled 2018-04-24: qty 5

## 2018-04-24 MED ORDER — PALONOSETRON HCL INJECTION 0.25 MG/5ML
0.2500 mg | Freq: Once | INTRAVENOUS | Status: AC
  Administered 2018-04-24: 0.25 mg via INTRAVENOUS

## 2018-04-24 MED ORDER — SODIUM CHLORIDE 0.9% FLUSH: 10.0000 mL | INTRAVENOUS | Status: DC | PRN

## 2018-04-24 MED ORDER — SODIUM CHLORIDE 0.9 % IV SOLN
10.0000 mg | Freq: Once | INTRAVENOUS | Status: AC
  Administered 2018-04-24: 10 mg via INTRAVENOUS
  Filled 2018-04-24: qty 1

## 2018-04-24 NOTE — Patient Instructions (Signed)
[facility] Discharge Instructions for Patients Receiving Chemotherapy  Today you received the following chemotherapy agents   To help prevent nausea and vomiting after your treatment, we encourage you to take your nausea medication   If you develop nausea and vomiting that is not controlled by your nausea medication, call the clinic.   BELOW ARE SYMPTOMS THAT SHOULD BE REPORTED IMMEDIATELY:  *FEVER GREATER THAN 100.5 F  *CHILLS WITH OR WITHOUT FEVER  NAUSEA AND VOMITING THAT IS NOT CONTROLLED WITH YOUR NAUSEA MEDICATION  *UNUSUAL SHORTNESS OF BREATH  *UNUSUAL BRUISING OR BLEEDING  TENDERNESS IN MOUTH AND THROAT WITH OR WITHOUT PRESENCE OF ULCERS  *URINARY PROBLEMS  *BOWEL PROBLEMS  UNUSUAL RASH Items with * indicate a potential emergency and should be followed up as soon as possible.  Feel free to call the clinic should you have any questions or concerns. The clinic phone number is [phone].  Please show the CHEMO ALERT CARD at check-in to the Emergency Department and triage nurse.   

## 2018-04-24 NOTE — Progress Notes (Signed)
Labs meet parameters for treatment today. No new issues reported by patient. Proceed today per protocol. Will review labs with MD.   Continuous 5FU pump connected today per orders.   Treatment given per orders. Patient tolerated it well without problems. Vitals stable and discharged home from clinic ambulatory. Follow up as scheduled.

## 2018-04-26 ENCOUNTER — Inpatient Hospital Stay (HOSPITAL_COMMUNITY): Payer: Medicaid Other

## 2018-04-26 ENCOUNTER — Other Ambulatory Visit: Payer: Self-pay

## 2018-04-26 VITALS — BP 127/72 | HR 100 | Temp 98.6°F | Resp 16

## 2018-04-26 DIAGNOSIS — C188 Malignant neoplasm of overlapping sites of colon: Secondary | ICD-10-CM

## 2018-04-26 DIAGNOSIS — Z5111 Encounter for antineoplastic chemotherapy: Secondary | ICD-10-CM | POA: Diagnosis not present

## 2018-04-26 DIAGNOSIS — C184 Malignant neoplasm of transverse colon: Secondary | ICD-10-CM

## 2018-04-26 MED ORDER — HEPARIN SOD (PORK) LOCK FLUSH 100 UNIT/ML IV SOLN
500.0000 [IU] | Freq: Once | INTRAVENOUS | Status: AC | PRN
  Administered 2018-04-26: 500 [IU]

## 2018-04-26 MED ORDER — SODIUM CHLORIDE 0.9% FLUSH
10.0000 mL | INTRAVENOUS | Status: DC | PRN
  Administered 2018-04-26: 10 mL
  Filled 2018-04-26: qty 10

## 2018-05-02 ENCOUNTER — Ambulatory Visit: Payer: Medicaid Other | Admitting: Neurology

## 2018-05-07 ENCOUNTER — Other Ambulatory Visit (HOSPITAL_COMMUNITY): Payer: Self-pay

## 2018-05-07 DIAGNOSIS — C189 Malignant neoplasm of colon, unspecified: Secondary | ICD-10-CM

## 2018-05-08 ENCOUNTER — Other Ambulatory Visit (HOSPITAL_COMMUNITY): Payer: Self-pay | Admitting: Internal Medicine

## 2018-05-08 ENCOUNTER — Inpatient Hospital Stay (HOSPITAL_BASED_OUTPATIENT_CLINIC_OR_DEPARTMENT_OTHER): Payer: Medicaid Other | Admitting: Hematology

## 2018-05-08 ENCOUNTER — Other Ambulatory Visit (HOSPITAL_COMMUNITY): Payer: Self-pay | Admitting: Hematology

## 2018-05-08 ENCOUNTER — Encounter (HOSPITAL_COMMUNITY): Payer: Self-pay

## 2018-05-08 ENCOUNTER — Other Ambulatory Visit: Payer: Self-pay

## 2018-05-08 ENCOUNTER — Encounter (HOSPITAL_COMMUNITY): Payer: Self-pay | Admitting: Hematology

## 2018-05-08 ENCOUNTER — Inpatient Hospital Stay (HOSPITAL_COMMUNITY): Payer: Medicaid Other

## 2018-05-08 ENCOUNTER — Inpatient Hospital Stay (HOSPITAL_COMMUNITY): Payer: Medicaid Other | Attending: Hematology

## 2018-05-08 VITALS — BP 141/75 | HR 86 | Temp 97.9°F | Resp 18 | Wt 234.0 lb

## 2018-05-08 DIAGNOSIS — C188 Malignant neoplasm of overlapping sites of colon: Secondary | ICD-10-CM | POA: Diagnosis not present

## 2018-05-08 DIAGNOSIS — Z806 Family history of leukemia: Secondary | ICD-10-CM

## 2018-05-08 DIAGNOSIS — E538 Deficiency of other specified B group vitamins: Secondary | ICD-10-CM | POA: Insufficient documentation

## 2018-05-08 DIAGNOSIS — Z5111 Encounter for antineoplastic chemotherapy: Secondary | ICD-10-CM | POA: Insufficient documentation

## 2018-05-08 DIAGNOSIS — C786 Secondary malignant neoplasm of retroperitoneum and peritoneum: Secondary | ICD-10-CM | POA: Diagnosis not present

## 2018-05-08 DIAGNOSIS — C189 Malignant neoplasm of colon, unspecified: Secondary | ICD-10-CM

## 2018-05-08 DIAGNOSIS — C772 Secondary and unspecified malignant neoplasm of intra-abdominal lymph nodes: Secondary | ICD-10-CM | POA: Diagnosis present

## 2018-05-08 DIAGNOSIS — C184 Malignant neoplasm of transverse colon: Secondary | ICD-10-CM

## 2018-05-08 DIAGNOSIS — G620 Drug-induced polyneuropathy: Secondary | ICD-10-CM

## 2018-05-08 DIAGNOSIS — R197 Diarrhea, unspecified: Secondary | ICD-10-CM

## 2018-05-08 DIAGNOSIS — T451X5A Adverse effect of antineoplastic and immunosuppressive drugs, initial encounter: Principal | ICD-10-CM

## 2018-05-08 DIAGNOSIS — Z801 Family history of malignant neoplasm of trachea, bronchus and lung: Secondary | ICD-10-CM

## 2018-05-08 DIAGNOSIS — Z87891 Personal history of nicotine dependence: Secondary | ICD-10-CM

## 2018-05-08 LAB — URINALYSIS, DIPSTICK ONLY
Bilirubin Urine: NEGATIVE
Glucose, UA: NEGATIVE mg/dL
Hgb urine dipstick: NEGATIVE
Ketones, ur: NEGATIVE mg/dL
Leukocytes,Ua: NEGATIVE
Nitrite: NEGATIVE
Protein, ur: NEGATIVE mg/dL
Specific Gravity, Urine: 1.023 (ref 1.005–1.030)
pH: 5 (ref 5.0–8.0)

## 2018-05-08 LAB — COMPREHENSIVE METABOLIC PANEL
ALT: 14 U/L (ref 0–44)
AST: 15 U/L (ref 15–41)
Albumin: 3.4 g/dL — ABNORMAL LOW (ref 3.5–5.0)
Alkaline Phosphatase: 65 U/L (ref 38–126)
Anion gap: 11 (ref 5–15)
BUN: 13 mg/dL (ref 6–20)
CO2: 22 mmol/L (ref 22–32)
Calcium: 8.6 mg/dL — ABNORMAL LOW (ref 8.9–10.3)
Chloride: 105 mmol/L (ref 98–111)
Creatinine, Ser: 0.9 mg/dL (ref 0.61–1.24)
GFR calc Af Amer: >60 mL/min (ref >60)
GFR calc non Af Amer: >60 mL/min (ref >60)
Glucose, Bld: 134 mg/dL — ABNORMAL HIGH (ref 70–99)
Potassium: 3.6 mmol/L (ref 3.5–5.1)
Sodium: 138 mmol/L (ref 135–145)
Total Bilirubin: 0.3 mg/dL (ref 0.3–1.2)
Total Protein: 6.6 g/dL (ref 6.5–8.1)

## 2018-05-08 LAB — CBC WITH DIFFERENTIAL/PLATELET
Abs Immature Granulocytes: 0.01 10*3/uL (ref 0.00–0.07)
Basophils Absolute: 0 10*3/uL (ref 0.0–0.1)
Basophils Relative: 1 %
Eosinophils Absolute: 0.1 10*3/uL (ref 0.0–0.5)
Eosinophils Relative: 2 %
HCT: 39.4 % (ref 39.0–52.0)
Hemoglobin: 12.5 g/dL — ABNORMAL LOW (ref 13.0–17.0)
Immature Granulocytes: 0 %
Lymphocytes Relative: 27 %
Lymphs Abs: 1.2 10*3/uL (ref 0.7–4.0)
MCH: 32.7 pg (ref 26.0–34.0)
MCHC: 31.7 g/dL (ref 30.0–36.0)
MCV: 103.1 fL — ABNORMAL HIGH (ref 80.0–100.0)
Monocytes Absolute: 0.5 10*3/uL (ref 0.1–1.0)
Monocytes Relative: 11 %
Neutro Abs: 2.7 10*3/uL (ref 1.7–7.7)
Neutrophils Relative %: 59 %
Platelets: 185 10*3/uL (ref 150–400)
RBC: 3.82 MIL/uL — ABNORMAL LOW (ref 4.22–5.81)
RDW: 15.9 % — ABNORMAL HIGH (ref 11.5–15.5)
WBC: 4.6 10*3/uL (ref 4.0–10.5)
nRBC: 0 % (ref 0.0–0.2)

## 2018-05-08 LAB — MAGNESIUM: Magnesium: 1.9 mg/dL (ref 1.7–2.4)

## 2018-05-08 MED ORDER — SODIUM CHLORIDE 0.9 % IV SOLN
10.0000 mg | Freq: Once | INTRAVENOUS | Status: AC
  Administered 2018-05-08: 10 mg via INTRAVENOUS
  Filled 2018-05-08: qty 10

## 2018-05-08 MED ORDER — SODIUM CHLORIDE 0.9 % IV SOLN
2400.0000 mg/m2 | INTRAVENOUS | Status: DC
  Administered 2018-05-08: 5650 mg via INTRAVENOUS
  Filled 2018-05-08: qty 100

## 2018-05-08 MED ORDER — AMITRIPTYLINE HCL 25 MG PO TABS: 25.0000 mg | ORAL_TABLET | Freq: Every day | ORAL | 3 refills | Status: DC

## 2018-05-08 MED ORDER — PALONOSETRON HCL INJECTION 0.25 MG/5ML
0.2500 mg | Freq: Once | INTRAVENOUS | Status: AC
  Administered 2018-05-08: 10:00:00 0.25 mg via INTRAVENOUS
  Filled 2018-05-08: qty 5

## 2018-05-08 MED ORDER — LEUCOVORIN CALCIUM INJECTION 350 MG
900.0000 mg | Freq: Once | INTRAVENOUS | Status: AC
  Administered 2018-05-08: 11:00:00 900 mg via INTRAVENOUS
  Filled 2018-05-08: qty 45

## 2018-05-08 MED ORDER — SODIUM CHLORIDE 0.9 % IV SOLN
Freq: Once | INTRAVENOUS | Status: AC
  Administered 2018-05-08: 09:00:00 via INTRAVENOUS

## 2018-05-08 MED ORDER — SODIUM CHLORIDE 0.9 % IV SOLN
4.8000 mg/kg | Freq: Once | INTRAVENOUS | Status: AC
  Administered 2018-05-08: 500 mg via INTRAVENOUS
  Filled 2018-05-08: qty 16

## 2018-05-08 MED ORDER — IRINOTECAN HCL CHEMO INJECTION 100 MG/5ML
180.0000 mg/m2 | Freq: Once | INTRAVENOUS | Status: AC
  Administered 2018-05-08: 420 mg via INTRAVENOUS
  Filled 2018-05-08: qty 15

## 2018-05-08 MED ORDER — FLUOROURACIL CHEMO INJECTION 2.5 GM/50ML
400.0000 mg/m2 | Freq: Once | INTRAVENOUS | Status: AC
  Administered 2018-05-08: 14:00:00 950 mg via INTRAVENOUS
  Filled 2018-05-08: qty 19

## 2018-05-08 MED ORDER — ATROPINE SULFATE 1 MG/ML IJ SOLN
0.5000 mg | Freq: Once | INTRAMUSCULAR | Status: AC
  Administered 2018-05-08: 0.5 mg via INTRAVENOUS
  Filled 2018-05-08: qty 1

## 2018-05-08 NOTE — Progress Notes (Signed)
Pt seen by Dr. Delton Coombes today. Proceed with tx order given. Labs reviewed and within parametes. VSS. MAR reviewed.   Treatment given today per MD orders. Tolerated infusion without adverse affects. Vital signs stable. No complaints at this time. Discharged from clinic ambulatory. F/U with [facility] as scheduled.

## 2018-05-08 NOTE — Progress Notes (Signed)
[place], [place] [address]   CLINIC:  Medical Oncology/Hematology  PCP:  Patient, No Pcp Per No address on file None   REASON FOR VISIT:  Follow-up for Malignant neoplasm of transverse colon    BRIEF ONCOLOGIC HISTORY:  Oncology History   Stage IIIC ([id number]) adenocarcinoma of transverse colon, diagnosed on colonoscopy by Dr. Oneida Alar on 01/12/2015 followed by definitive surgery by Dr. [name] with right hemicolectomy on 02/12/2015.  He then underwent FOLFOX x 12 cycles in the adjuvant setting (03/30/2015- 09/06/2015).     Malignant neoplasm of transverse colon ([place])   01/12/2015 Pathologic Stage    Colon, biopsy, distal transverse - TUBULOVILLOUS ADENOMA WITH HIGH GRADE DYSPLASIA.    01/12/2015 Procedure    Colonoscopy by Dr. Oneida Alar.    01/12/2015 Tumor Marker    CEA: 6.6 (H)     01/18/2015 Imaging    CT abd/pelvis- Apple-core lesion identified in the mid transverse colon without obstruction. No evidence for lymphadenopathy in the gastrohepatic ligament or omentum.  Stable 8 mm hypo attenuating lesion in the left liver, likely a cyst.    02/10/2015 Initial Diagnosis    Adenocarcinoma of transverse colon ([place])    02/12/2015 Definitive Surgery    [name], Extended right hemicolectomy     02/12/2015 Pathology Results    Mucinous adenocarcinoma with penetration of visceral peritoneum, 4/19 lymph nodes for metastatic disease, negative resection margins, with LVI and perineural invasion    03/30/2015 - 09/06/2015 Chemotherapy    FOLFOX x 12 cycles    05/25/2015 Treatment Plan Change    5 FU bolus discontinued for cycle #5    06/08/2015 Treatment Plan Change    Treatment deferred x 1 week    06/15/2015 Treatment Plan Change    5FU CI decreased by 10% and Oxaliplatin reduced by 15% for cycles #6-#11; Oxaliplatin dropped for cycle #12 d/t neuropathy.     10/20/2015 Imaging    CT CAP- Right hemicolectomy without evidence of metastatic  disease. 2. Previously measured ground-glass lesion in the left upper lobe has resolved. 3. Probable food debris in the stomach, simulating gastric wall thickening. Please correlate clinically. 4. 6 mm irregular nodular density in the left upper lobe, stable. Continued attention on followup exams is warranted.    01/25/2016 Procedure    Colonoscopy by Dr. Oneida Alar- Non-thrombosed external hemorrhoids found on digital rectal exam. - One 4 mm polyp in the rectum, removed with a cold biopsy forceps. Resected and retrieved. - Congested mucosa in the neo-terminal ileum. Biopsied. - Redundant colon. - Internal hemorrhoids.    01/26/2016 Pathology Results    1. Terminal ileum, biopsy - MILD ACUTE (ACTIVE) ILEITIS. - NO DYSPLASIA OR MALIGNANCY IDENTIFIED - SEE COMMENT. 2. Rectum, polyp(s) - HYPERPLASTIC POLYP (X 1). - NO DYSPLASIA OR MALIGNANCY IDENTIFIED.    04/13/2016 Imaging    CT chest- Stable CT chest. 6 mm irregular nodule anterior left upper lobe is stable. The scattered areas of peribronchovascular micro nodularity in the lungs bilaterally are unchanged.    10/19/2016 Imaging    CT CAP: 1. Status post right hemicolectomy. No findings to suggest metastatic disease in the abdomen or pelvis. 2. Aortic atherosclerosis. 3. Additional incidental findings, as above. Aortic Atherosclerosis (ICD10-I70.0).    01/09/2018 -  Chemotherapy    The patient had palonosetron (ALOXI) injection 0.25 mg, 0.25 mg, Intravenous,  Once, 9 of 11 cycles Administration: 0.25 mg (01/09/2018), 0.25 mg (01/23/2018), 0.25 mg (02/11/2018), 0.25 mg (02/25/2018), 0.25  mg (03/12/2018), 0.25 mg (03/27/2018), 0.25 mg (04/10/2018), 0.25 mg (04/24/2018), 0.25 mg (05/08/2018) irinotecan (CAMPTOSAR) 420 mg in dextrose 5 % 500 mL chemo infusion, 180 mg/m2 = 420 mg, Intravenous,  Once, 9 of 11 cycles Administration: 420 mg (01/09/2018), 420 mg (01/23/2018), 420 mg (02/11/2018), 420 mg (02/25/2018), 420 mg (03/12/2018), 420 mg (03/27/2018),  420 mg (04/10/2018), 420 mg (04/24/2018), 420 mg (05/08/2018) leucovorin 900 mg in dextrose 5 % 250 mL infusion, 944 mg, Intravenous,  Once, 9 of 11 cycles Administration: 900 mg (01/09/2018), 900 mg (01/23/2018), 900 mg (02/11/2018), 900 mg (02/25/2018), 900 mg (03/12/2018), 900 mg (03/27/2018), 900 mg (04/10/2018), 900 mg (04/24/2018), 900 mg (05/08/2018) fluorouracil (ADRUCIL) chemo injection 950 mg, 400 mg/m2 = 950 mg, Intravenous,  Once, 9 of 11 cycles Administration: 950 mg (01/09/2018), 950 mg (01/23/2018), 950 mg (02/11/2018), 950 mg (02/25/2018), 950 mg (03/12/2018), 950 mg (03/27/2018), 950 mg (04/10/2018), 950 mg (04/24/2018), 950 mg (05/08/2018) fluorouracil (ADRUCIL) 5,650 mg in sodium chloride 0.9 % 137 mL chemo infusion, 2,400 mg/m2 = 5,650 mg, Intravenous, 1 Day/Dose, 9 of 11 cycles Administration: 5,650 mg (01/09/2018), 5,650 mg (01/23/2018), 5,650 mg (02/11/2018), 5,650 mg (02/25/2018), 5,650 mg (03/12/2018), 5,650 mg (03/27/2018), 5,650 mg (04/10/2018), 5,650 mg (04/24/2018), 5,650 mg (05/08/2018)  for chemotherapy treatment.     01/23/2018 Cancer Staging    Staging form: Colon and Rectum, AJCC 7th Edition - Pathologic: M1 - Signed by Zoila Shutter, MD on 01/23/2018     Malignant neoplasm of overlapping sites of colon ([place])   12/26/2017 Initial Diagnosis    Cancer of overlapping sites of colon metastatic to intra-abdominal lymph node ([place])    01/09/2018 -  Chemotherapy    The patient had palonosetron (ALOXI) injection 0.25 mg, 0.25 mg, Intravenous,  Once, 9 of 11 cycles Administration: 0.25 mg (01/09/2018), 0.25 mg (01/23/2018), 0.25 mg (02/11/2018), 0.25 mg (02/25/2018), 0.25 mg (03/12/2018), 0.25 mg (03/27/2018), 0.25 mg (04/10/2018), 0.25 mg (04/24/2018), 0.25 mg (05/08/2018) irinotecan (CAMPTOSAR) 420 mg in dextrose 5 % 500 mL chemo infusion, 180 mg/m2 = 420 mg, Intravenous,  Once, 9 of 11 cycles Administration: 420 mg (01/09/2018), 420 mg (01/23/2018), 420 mg (02/11/2018), 420 mg (02/25/2018), 420 mg (03/12/2018), 420 mg  (03/27/2018), 420 mg (04/10/2018), 420 mg (04/24/2018), 420 mg (05/08/2018) leucovorin 900 mg in dextrose 5 % 250 mL infusion, 944 mg, Intravenous,  Once, 9 of 11 cycles Administration: 900 mg (01/09/2018), 900 mg (01/23/2018), 900 mg (02/11/2018), 900 mg (02/25/2018), 900 mg (03/12/2018), 900 mg (03/27/2018), 900 mg (04/10/2018), 900 mg (04/24/2018), 900 mg (05/08/2018) fluorouracil (ADRUCIL) chemo injection 950 mg, 400 mg/m2 = 950 mg, Intravenous,  Once, 9 of 11 cycles Administration: 950 mg (01/09/2018), 950 mg (01/23/2018), 950 mg (02/11/2018), 950 mg (02/25/2018), 950 mg (03/12/2018), 950 mg (03/27/2018), 950 mg (04/10/2018), 950 mg (04/24/2018), 950 mg (05/08/2018) fluorouracil (ADRUCIL) 5,650 mg in sodium chloride 0.9 % 137 mL chemo infusion, 2,400 mg/m2 = 5,650 mg, Intravenous, 1 Day/Dose, 9 of 11 cycles Administration: 5,650 mg (01/09/2018), 5,650 mg (01/23/2018), 5,650 mg (02/11/2018), 5,650 mg (02/25/2018), 5,650 mg (03/12/2018), 5,650 mg (03/27/2018), 5,650 mg (04/10/2018), 5,650 mg (04/24/2018), 5,650 mg (05/08/2018)  for chemotherapy treatment.      Colon cancer metastasized to multiple sites [facility])   01/16/2018 Initial Diagnosis    Colon cancer metastasized to multiple sites [facility])    01/23/2018 - 02/10/2018 Chemotherapy    The patient had panitumumab (VECTIBIX) 600 mg in sodium chloride 0.9 % 100 mL chemo infusion, 640 mg, Intravenous,  Once, 1 of 3 cycles Administration: 600  mg (01/23/2018)  for chemotherapy treatment.     03/12/2018 -  Chemotherapy    The patient had bevacizumab (AVASTIN) 500 mg in sodium chloride 0.9 % 100 mL chemo infusion, 525 mg, Intravenous,  Once, 5 of 7 cycles Administration: 500 mg (03/12/2018), 500 mg (03/27/2018), 500 mg (04/10/2018), 500 mg (04/24/2018)  for chemotherapy treatment.       CANCER STAGING: Cancer Staging Malignant neoplasm of transverse colon [facility]) Staging form: Colon and Rectum, AJCC 7th Edition - Pathologic stage from 03/04/2015: Stage IIIC (T4a, N2a, cM0) - Signed by Baird Cancer, PA-C on 03/04/2015 - Pathologic: M1 - Signed by Zoila Shutter, MD on 01/23/2018    INTERVAL HISTORY:  Mr. Nijjar 63 y.o. male returns for routine follow-up and consideration for next cycle of chemotherapy. He is here today alone. He states that he is very tired after his treatment for 4-5 days. He states that he drinks protein drink every morning. He states that he has trouble sleeping due to a head injury years ago. Denies any nausea, vomiting, or diarrhea. Denies any new pains. Had not noticed any recent bleeding such as epistaxis, hematuria or hematochezia. Denies recent chest pain on exertion, shortness of breath on minimal exertion, pre-syncopal episodes, or palpitations. Denies any numbness or tingling in hands or feet. Denies any recent fevers, infections, or recent hospitalizations. Patient reports appetite at 50% and energy level at 25%.     REVIEW OF SYSTEMS:  Review of Systems  Constitutional: Positive for fatigue.  Psychiatric/Behavioral: Positive for sleep disturbance.     PAST MEDICAL/SURGICAL HISTORY:  Past Medical History:  Diagnosis Date  . Abnormal stress echocardiogram   . Adenocarcinoma of transverse colon ([place]) 02/10/2015  . Alcohol abuse    Heavy Use up until 2010  . Anxiety   . Blood transfusion without reported diagnosis   . Cancer of overlapping sites of colon metastatic to intra-abdominal lymph node ([place]) 12/26/2017  . COPD (chronic obstructive pulmonary disease) ([place])   . Depression   . Emphysema of lung ([place])   . GERD (gastroesophageal reflux disease)   . Head trauma 2001   closed head injury; coma for 4 weeks  . Hypercholesterolemia   . Hypertension   . ING HERN W/GANGREN RECUR UNILAT/UNSPEC ING HERN 04/21/2009   Qualifier: Diagnosis of  By: Verl Blalock, MD, Delanna Ahmadi PTSD (post-traumatic stress disorder)   . Pulmonary nodule 06/14/2016  . SAH (subarachnoid hemorrhage) ([place]) 08/31/2012  . SDH (subdural hematoma) ([place]) 08/31/2012   Past  Surgical History:  Procedure Laterality Date  . BIOPSY  01/25/2016   Procedure: BIOPSY;  Surgeon: Danie Binder, MD;  [facility];  Service: Endoscopy;;  ileum;   . cardiac cath    . COLONOSCOPY  2011   Dr. Oneida Alar: multiple adenomas and hyperplastic polyps  . COLONOSCOPY WITH PROPOFOL N/A 01/12/2015   Procedure: COLONOSCOPY WITH PROPOFOL;  Surgeon: Danie Binder, MD;  Location: [facility];  Service: Endoscopy;  Laterality: N/A;  1030  . COLONOSCOPY WITH PROPOFOL N/A 01/25/2016   Procedure: COLONOSCOPY WITH PROPOFOL;  Surgeon: Danie Binder, MD;  Location: [facility];  Service: Endoscopy;  Laterality: N/A;  1230  . CRANIOTOMY  2001  . ESOPHAGOGASTRODUODENOSCOPY (EGD) WITH PROPOFOL N/A 01/12/2015   Procedure: ESOPHAGOGASTRODUODENOSCOPY (EGD) WITH PROPOFOL;  Surgeon: Danie Binder, MD;  [facility];  Service: Endoscopy;  Laterality: N/A;  . head injury surgery    . HERNIA REPAIR Right 2012  Inguinal- [place]  . KIDNEY SURGERY     >30 years ago  . LAPAROSCOPIC RIGHT HEMI COLECTOMY Left 02/10/2015   Procedure: LAPAROSCOPIC THEN OPEN RIGHT HEMI COLECTOMY;  Surgeon: [name], MD;  Location: [facility] ORS;  Service: General;  Laterality: Left;  . LAPAROTOMY N/A 12/19/2017   Procedure: EXPLORATORY LAPAROTOMY;  Surgeon: Aviva Signs, MD;  Location: [facility];  Service: General;  Laterality: N/A;  . POLYPECTOMY  01/25/2016   Procedure: POLYPECTOMY;  Surgeon: Danie Binder, MD;  [facility];  Service: Endoscopy;;  colon  . PORT-A-CATH REMOVAL N/A 02/26/2017   Procedure: MINOR REMOVAL PORT-A-CATH;  Surgeon: Aviva Signs, MD;  Location: [facility];  Service: General;  Laterality: N/A;  Pt to arrive at [place] N/A 03/24/2015   Procedure: INSERTION PORT-A-CATH;  Surgeon: Jules Husbands, MD;  Location: [facility] ORS;  Service: General;  Laterality: N/A;  . PORTACATH PLACEMENT Left 01/07/2018   Procedure: INSERTION PORT A CATH (ATTACHED CATHETER IN  LEFT SUBCLAVIAN);  Surgeon: Aviva Signs, MD;  Location: [facility];  Service: General;  Laterality: Left;     SOCIAL HISTORY:  Social History   Socioeconomic History  . Marital status: Divorced    Spouse name: Not on file  . Number of children: 2  . Years of education: 62  . Highest education level: Not on file  Occupational History  . Occupation: disabled/unemployed  . Occupation: unemployed/Mediicaid only    Comment: NO INCOME  Social Needs  . Financial resource strain: Very hard  . Food insecurity:    Worry: Often true    Inability: Often true  . Transportation needs:    Medical: Yes    Non-medical: No  Tobacco Use  . Smoking status: Former Smoker    Packs/day: 0.10    Years: 30.00    Pack years: 3.00    Types: Cigarettes    Start date: 02/06/1973    Last attempt to quit: 12/09/2017    Years since quitting: 0.4  . Smokeless tobacco: Never Used  . Tobacco comment: Quit on 12/09/2017  Substance and Sexual Activity  . Alcohol use: Yes    Alcohol/week: 0.0 standard drinks    Comment: beer occ, history of ETOH abuse in remote past.   . Drug use: No  . Sexual activity: Never    Partners: Female    Birth control/protection: None  Lifestyle  . Physical activity:    Days per week: Not on file    Minutes per session: Not on file  . Stress: Not on file  Relationships  . Social connections:    Talks on phone: Not on file    Gets together: Not on file    Attends religious service: Not on file    Active member of club or organization: Not on file    Attends meetings of clubs or organizations: Not on file    Relationship status: Not on file  . Intimate partner violence:    Fear of current or ex partner: Not on file    Emotionally abused: Not on file    Physically abused: Not on file    Forced sexual activity: Not on file  Other Topics Concern  . Not on file  Social History Narrative   Single   Lives alone   Turned down for disability    FAMILY HISTORY:  Family  History  Problem Relation Age of Onset  . Pulmonary embolism Mother   . Breast cancer Mother   .  Cancer Mother        breast cancer  . Arthritis Mother   . Heart disease Father   . Cancer Father 19       Leukemia  . Cancer Paternal Grandfather        Lung  . Ataxia Neg Hx   . Chorea Neg Hx   . Dementia Neg Hx   . Mental retardation Neg Hx   . Migraines Neg Hx   . Multiple sclerosis Neg Hx   . Neurofibromatosis Neg Hx   . Neuropathy Neg Hx   . Parkinsonism Neg Hx   . Seizures Neg Hx   . Stroke Neg Hx   . Colon cancer Neg Hx     CURRENT MEDICATIONS:  Outpatient Encounter Medications as of 05/08/2018  Medication Sig  . atorvastatin (LIPITOR) 20 MG tablet Take 1 tablet (20 mg total) by mouth at bedtime. (Patient taking differently: Take 20 mg by mouth every morning. )  . buPROPion (WELLBUTRIN SR) 150 MG 12 hr tablet Take 150 mg by mouth every morning.   . busPIRone (BUSPAR) 10 MG tablet Take 10 mg by mouth 2 (two) times daily.  . fluorouracil CALGB 01027 in sodium chloride 0.9 % 150 mL Inject 5,650 mg into the vein. Over 46 hours  . FLUoxetine (PROZAC) 20 MG capsule TAKE 1 CAPSULE BY MOUTH 2 TIMES A DAY (Patient taking differently: Take 20 mg by mouth 2 (two) times daily. )  . gabapentin (NEURONTIN) 300 MG capsule Take 1 capsule (300 mg total) by mouth 3 (three) times daily.  . IRINOTECAN HCL IV Inject 420 mg into the vein every 14 (fourteen) days.  Marland Kitchen LEUCOVORIN CALCIUM IV Inject 944 mg into the vein every 14 (fourteen) days.  Marland Kitchen lidocaine (XYLOCAINE) 2 % solution Use as directed 15 mLs in the mouth or throat every 4 (four) hours as needed for mouth pain (as an aid to eat and drink).  . lidocaine-prilocaine (EMLA) cream APPLY A SMALL AMOUNT OVER PORT SITE AND COVER WITH PLASTIC WRAP ONE HOUR PRIOR TO APPOINTMENT  . loperamide (IMODIUM) 2 MG capsule TAKE 1 TO 2 CAPSULES BY MOUTH EVERY 6 HOURS AS NEEDED FOR DIARRHEA OR LOOSE STOOLS  . omeprazole (PRILOSEC) 20 MG capsule Take 20 mg by  mouth every morning.   . ondansetron (ZOFRAN) 8 MG tablet Take 1 tablet (8 mg total) by mouth 2 (two) times daily as needed for refractory nausea / vomiting. Start on day 3 after chemotherapy.  Marland Kitchen PROAIR HFA 108 (90 Base) MCG/ACT inhaler INHALE 2 PUFFS BY MOUTH EVERY 6 HOURS AS NEEDED FOR SHORTNESS OF BREATH/WHEEZING. (Patient taking differently: Inhale 2 puffs into the lungs every 6 (six) hours as needed for wheezing or shortness of breath. )  . prochlorperazine (COMPAZINE) 10 MG tablet Take 1 tablet (10 mg total) by mouth every 6 (six) hours as needed (NAUSEA).  Marland Kitchen triamcinolone ointment (KENALOG) 0.5 % Apply 1 application topically 2 (two) times daily.  Marland Kitchen amitriptyline (ELAVIL) 25 MG tablet Take 1 tablet (25 mg total) by mouth at bedtime.  . [DISCONTINUED] amitriptyline (ELAVIL) 25 MG tablet TAKE 1 TABLET BY MOUTH EVERY NIGHT AT BEDTIME (Patient not taking: No sig reported)   No facility-administered encounter medications on file as of 05/08/2018.     ALLERGIES:  No Known Allergies   PHYSICAL EXAM:  ECOG Performance status: 1 I reviewed vitals.  Blood pressure is 127/83.  Pulse rate is 100.  Respirate is 18.  Temperature 98.5.  Saturations 100%.  Physical Exam Constitutional:      Appearance: Normal appearance.  Cardiovascular:     Rate and Rhythm: Normal rate and regular rhythm.     Heart sounds: Normal heart sounds.  Pulmonary:     Effort: Pulmonary effort is normal.     Breath sounds: Normal breath sounds.  Abdominal:     General: There is no distension.     Palpations: Abdomen is soft.     Tenderness: There is no abdominal tenderness.  Musculoskeletal:        General: No swelling.  Skin:    General: Skin is warm.  Neurological:     General: No focal deficit present.     Mental Status: He is alert and oriented to person, place, and time.  Psychiatric:        Mood and Affect: Mood normal.        Behavior: Behavior normal.      LABORATORY DATA:  I have reviewed the labs  as listed.  CBC    Component Value Date/Time   WBC 4.6 05/08/2018 0807   RBC 3.82 (L) 05/08/2018 0807   HGB 12.5 (L) 05/08/2018 0807   HCT 39.4 05/08/2018 0807   PLT 185 05/08/2018 0807   MCV 103.1 (H) 05/08/2018 0807   MCH 32.7 05/08/2018 0807   MCHC 31.7 05/08/2018 0807   RDW 15.9 (H) 05/08/2018 0807   LYMPHSABS 1.2 05/08/2018 0807   MONOABS 0.5 05/08/2018 0807   EOSABS 0.1 05/08/2018 0807   BASOSABS 0.0 05/08/2018 0807   CMP Latest Ref Rng & Units 05/08/2018 04/24/2018 04/10/2018  Glucose 70 - 99 mg/dL 134(H) 113(H) 113(H)  BUN 6 - 20 mg/dL _0 Creatinine 0.61 - 1.24 mg/dL 0.90 0.72 0.92  Sodium 135 - 145 mmol/L 138 140 139  Potassium 3.5 - 5.1 mmol/L 3.6 3.8 4.2  Chloride 98 - 111 mmol/L 105 104 106  CO2 22 - 32 mmol/L _1 Calcium 8.9 - 10.3 mg/dL 8.6(L) 8.8(L) 8.9  Total Protein 6.5 - 8.1 g/dL 6.6 6.8 6.6  Total Bilirubin 0.3 - 1.2 mg/dL 0.3 0.6 0.4  Alkaline Phos 38 - 126 U/L 65 67 64  AST 15 - 41 U/L _2 ALT 0 - 44 U/L _3 DIAGNOSTIC IMAGING:  I have independently reviewed the scans and discussed with the patient.   I have reviewed Venita Lick LPN's note and agree with the documentation.  I personally performed a face-to-face visit, made revisions and my assessment and plan is as follows.    ASSESSMENT & PLAN:   Colon cancer metastasized to multiple sites ([place]) 1.  Metastatic colon cancer: -Foundation 1 CDX shows MS-stable, K-ras Q 61L,TP53 S215I, NRAS wild type, APC T939f*16, FAM123B [id number] - Status post FOLFOX for 12 cycles in the adjuvant setting from 03/30/2015 through 09/06/2015. -PET CT scan on 11/19/2017 showing peritoneal carcinomatosis, right paratracheal lymph node with mild FDG uptake with mild nonspecific increased uptake in the right hilar region.  CEA was elevated at 12.9 on 11/16/2017. - 8 cycles of FOLFIRI from 01/09/2018 through 04/24/2018 with addition of bevacizumab during cycle 5.  (After having severe rash with  vectibix during cycle 2.) - He reports that he is tolerating chemotherapy reasonably well.  He feels tired for 3 to 5 days after each treatment.  Denies any bleeding issues.  He also has some nausea but no vomiting. - I have reviewed his blood work.  He  may proceed with cycle 9 today without any dose modifications. -I will see him back in 2 weeks for follow-up.  I plan to repeat a PET CT scan prior to next visit.  We will also obtain a CEA level.  2.  Diarrhea: - He has chronic diarrhea since his surgery.  This is not any worse since the start of chemotherapy. -He is taking Imodium 2 tablets twice daily.      Orders placed this encounter:  Orders Placed This Encounter  Procedures  . CT Abdomen Pelvis W Contrast  . CT Chest W Contrast  . CEA  . CBC with Differential/Platelet  . Comprehensive metabolic panel      Derek Jack, MD [place] [phone]

## 2018-05-08 NOTE — Patient Instructions (Signed)
[facility] Discharge Instructions for Patients Receiving Chemotherapy  Today you received the following chemotherapy agents   To help prevent nausea and vomiting after your treatment, we encourage you to take your nausea medication   If you develop nausea and vomiting that is not controlled by your nausea medication, call the clinic.   BELOW ARE SYMPTOMS THAT SHOULD BE REPORTED IMMEDIATELY:  *FEVER GREATER THAN 100.5 F  *CHILLS WITH OR WITHOUT FEVER  NAUSEA AND VOMITING THAT IS NOT CONTROLLED WITH YOUR NAUSEA MEDICATION  *UNUSUAL SHORTNESS OF BREATH  *UNUSUAL BRUISING OR BLEEDING  TENDERNESS IN MOUTH AND THROAT WITH OR WITHOUT PRESENCE OF ULCERS  *URINARY PROBLEMS  *BOWEL PROBLEMS  UNUSUAL RASH Items with * indicate a potential emergency and should be followed up as soon as possible.  Feel free to call the clinic should you have any questions or concerns. The clinic phone number is [phone].  Please show the CHEMO ALERT CARD at check-in to the Emergency Department and triage nurse.   

## 2018-05-08 NOTE — Assessment & Plan Note (Signed)
1.  Metastatic colon cancer: -Foundation 1 CDX shows MS-stable, K-ras Q 61L,TP53 S215I, NRAS wild type, APC T952f*16, FAM123B [id number] - Status post FOLFOX for 12 cycles in the adjuvant setting from 03/30/2015 through 09/06/2015. -PET CT scan on 11/19/2017 showing peritoneal carcinomatosis, right paratracheal lymph node with mild FDG uptake with mild nonspecific increased uptake in the right hilar region.  CEA was elevated at 12.9 on 11/16/2017. - 8 cycles of FOLFIRI from 01/09/2018 through 04/24/2018 with addition of bevacizumab during cycle 5.  (After having severe rash with vectibix during cycle 2.) - He reports that he is tolerating chemotherapy reasonably well.  He feels tired for 3 to 5 days after each treatment.  Denies any bleeding issues.  He also has some nausea but no vomiting. - I have reviewed his blood work.  He may proceed with cycle 9 today without any dose modifications. -I will see him back in 2 weeks for follow-up.  I plan to repeat a PET CT scan prior to next visit.  We will also obtain a CEA level.  2.  Diarrhea: - He has chronic diarrhea since his surgery.  This is not any worse since the start of chemotherapy. -He is taking Imodium 2 tablets twice daily.

## 2018-05-08 NOTE — Patient Instructions (Addendum)
[place] at [facility] Discharge Instructions  You were seen today by Dr. Delton Coombes. He went over your recent lab results. He will schedule you for scans prior to your next visit. He will see you back in 2 weeks for labs, treatment and follow up.   Thank you for choosing [place] at [facility] to provide your oncology and hematology care.  To afford each patient quality time with our provider, please arrive at least 15 minutes before your scheduled appointment time.   If you have a lab appointment with the [place] please come in thru the  Main Entrance and check in at the main information desk  You need to re-schedule your appointment should you arrive 10 or more minutes late.  We strive to give you quality time with our providers, and arriving late affects you and other patients whose appointments are after yours.  Also, if you no show three or more times for appointments you may be dismissed from the clinic at the providers discretion.     Again, thank you for choosing [facility].  Our hope is that these requests will decrease the amount of time that you wait before being seen by our physicians.       _____________________________________________________________  Should you have questions after your visit to [facility], please contact our office at [phone] between the hours of 8:00 a.m. and 4:30 p.m.  Voicemails left after 4:00 p.m. will not be returned until the following business day.  For prescription refill requests, have your pharmacy contact our office and allow 72 hours.    Cancer Center Support Programs:   > Cancer Support Group  2nd Tuesday of the month 1pm-2pm, Journey Room

## 2018-05-09 NOTE — Telephone Encounter (Signed)
Erik [name] patient refill request.    

## 2018-05-10 ENCOUNTER — Other Ambulatory Visit: Payer: Self-pay

## 2018-05-10 ENCOUNTER — Inpatient Hospital Stay (HOSPITAL_COMMUNITY): Payer: Medicaid Other

## 2018-05-10 ENCOUNTER — Encounter (HOSPITAL_COMMUNITY): Payer: Self-pay

## 2018-05-10 DIAGNOSIS — C184 Malignant neoplasm of transverse colon: Secondary | ICD-10-CM

## 2018-05-10 DIAGNOSIS — Z5111 Encounter for antineoplastic chemotherapy: Secondary | ICD-10-CM | POA: Diagnosis not present

## 2018-05-10 DIAGNOSIS — C188 Malignant neoplasm of overlapping sites of colon: Secondary | ICD-10-CM

## 2018-05-10 MED ORDER — HEPARIN SOD (PORK) LOCK FLUSH 100 UNIT/ML IV SOLN
500.0000 [IU] | Freq: Once | INTRAVENOUS | Status: AC | PRN
  Administered 2018-05-10: 500 [IU]

## 2018-05-10 MED ORDER — SODIUM CHLORIDE 0.9% FLUSH
10.0000 mL | INTRAVENOUS | Status: DC | PRN
  Administered 2018-05-10: 13:00:00 10 mL
  Filled 2018-05-10: qty 10

## 2018-05-10 NOTE — Progress Notes (Signed)
Erik [name] returns today for port de access and flush after 46 hr continous infusion of 24fu. Tolerated infusion without problems. Portacath located left chest wall was  deaccessed and flushed with 40ml NS and 500U/18ml Heparin and needle removed intact.  Procedure without incident. Patient tolerated procedure well.  Vitals stable and discharged home from clinic ambulatory. Follow up as scheduled.

## 2018-05-20 ENCOUNTER — Telehealth (HOSPITAL_COMMUNITY): Payer: Self-pay | Admitting: *Deleted

## 2018-05-20 ENCOUNTER — Ambulatory Visit (HOSPITAL_COMMUNITY)
Admission: RE | Admit: 2018-05-20 | Discharge: 2018-05-20 | Disposition: A | Discharge status: Home / Self Care | Payer: Medicaid Other | Source: Ambulatory Visit | Attending: Hematology | Admitting: Hematology

## 2018-05-20 ENCOUNTER — Other Ambulatory Visit (HOSPITAL_COMMUNITY): Payer: Self-pay | Admitting: Hematology

## 2018-05-20 ENCOUNTER — Encounter (HOSPITAL_COMMUNITY): Payer: Self-pay | Admitting: *Deleted

## 2018-05-20 ENCOUNTER — Other Ambulatory Visit (HOSPITAL_COMMUNITY): Payer: Self-pay | Admitting: *Deleted

## 2018-05-20 ENCOUNTER — Other Ambulatory Visit: Payer: Self-pay

## 2018-05-20 DIAGNOSIS — C184 Malignant neoplasm of transverse colon: Secondary | ICD-10-CM

## 2018-05-20 DIAGNOSIS — R918 Other nonspecific abnormal finding of lung field: Secondary | ICD-10-CM

## 2018-05-20 DIAGNOSIS — I2699 Other pulmonary embolism without acute cor pulmonale: Secondary | ICD-10-CM

## 2018-05-20 MED ORDER — IOHEXOL 300 MG/ML  SOLN
100.0000 mL | Freq: Once | INTRAMUSCULAR | Status: AC | PRN
  Administered 2018-05-20: 100 mL via INTRAVENOUS

## 2018-05-20 MED ORDER — RIVAROXABAN (XARELTO) VTE STARTER PACK (15 & 20 MG): ORAL_TABLET | ORAL | 0 refills | Status: DC

## 2018-05-20 MED ORDER — IOHEXOL 350 MG/ML SOLN
75.0000 mL | Freq: Once | INTRAVENOUS | Status: AC | PRN
  Administered 2018-05-20: 75 mL via INTRAVENOUS

## 2018-05-20 NOTE — Progress Notes (Signed)
I was informed by radiology that there is a possible PE on the CT CAP.  We have done a dedicated study which showed multiple small pulmonary emboli without right heart strain. I will start him on anticoagulation with Xarelto as outpatient as the patient is asymptomatic.  I have sent a prescription to the pharmacy.

## 2018-05-20 NOTE — Telephone Encounter (Signed)
[facility] Radiology called to give results for pt CT scan. Results printed off and given to Dr. Delton Coombes.

## 2018-05-20 NOTE — Progress Notes (Signed)
I received stat report from patient's CT angio chest.  Report given to Dr. Delton Coombes. Orders received for patient to begin taking Xarelto right away.  Patient was notified.

## 2018-05-21 ENCOUNTER — Other Ambulatory Visit (HOSPITAL_COMMUNITY): Payer: Self-pay | Admitting: *Deleted

## 2018-05-21 ENCOUNTER — Telehealth: Payer: Self-pay

## 2018-05-21 DIAGNOSIS — I2699 Other pulmonary embolism without acute cor pulmonale: Secondary | ICD-10-CM

## 2018-05-21 MED ORDER — RIVAROXABAN (XARELTO) VTE STARTER PACK (15 & 20 MG): ORAL_TABLET | ORAL | 0 refills | Status: DC

## 2018-05-21 NOTE — Telephone Encounter (Signed)
Client called RN stating he has been diagnosed with pulmonary emboli and that he has been prescribed Xarelto however he does not have the copayment for this at [facility]. Client has medicaid, but does not have income.  RN called [facility] program assistant coordinator Norman Clay and she approved a one time assistance. Voucher sent to [facility] and will also request delivery due to transportation issues. Client informed that we will provide assistance and that his medication will be delivered to his home.  Will follow up to assure that client received his medication.   Debria Garret RN

## 2018-05-22 ENCOUNTER — Other Ambulatory Visit (HOSPITAL_COMMUNITY): Payer: Medicaid Other

## 2018-05-22 ENCOUNTER — Encounter (HOSPITAL_COMMUNITY): Payer: Self-pay | Admitting: Hematology

## 2018-05-22 ENCOUNTER — Inpatient Hospital Stay (HOSPITAL_COMMUNITY): Payer: Medicaid Other

## 2018-05-22 ENCOUNTER — Other Ambulatory Visit: Payer: Self-pay

## 2018-05-22 ENCOUNTER — Inpatient Hospital Stay (HOSPITAL_BASED_OUTPATIENT_CLINIC_OR_DEPARTMENT_OTHER): Payer: Medicaid Other | Admitting: Hematology

## 2018-05-22 VITALS — BP 122/72 | HR 85 | Temp 97.6°F | Resp 18

## 2018-05-22 DIAGNOSIS — I2699 Other pulmonary embolism without acute cor pulmonale: Secondary | ICD-10-CM | POA: Diagnosis not present

## 2018-05-22 DIAGNOSIS — C188 Malignant neoplasm of overlapping sites of colon: Secondary | ICD-10-CM

## 2018-05-22 DIAGNOSIS — Z87891 Personal history of nicotine dependence: Secondary | ICD-10-CM

## 2018-05-22 DIAGNOSIS — C189 Malignant neoplasm of colon, unspecified: Secondary | ICD-10-CM

## 2018-05-22 DIAGNOSIS — C772 Secondary and unspecified malignant neoplasm of intra-abdominal lymph nodes: Secondary | ICD-10-CM

## 2018-05-22 DIAGNOSIS — Z803 Family history of malignant neoplasm of breast: Secondary | ICD-10-CM

## 2018-05-22 DIAGNOSIS — Z5111 Encounter for antineoplastic chemotherapy: Secondary | ICD-10-CM | POA: Diagnosis not present

## 2018-05-22 DIAGNOSIS — C184 Malignant neoplasm of transverse colon: Secondary | ICD-10-CM

## 2018-05-22 DIAGNOSIS — C786 Secondary malignant neoplasm of retroperitoneum and peritoneum: Secondary | ICD-10-CM | POA: Diagnosis not present

## 2018-05-22 DIAGNOSIS — Z806 Family history of leukemia: Secondary | ICD-10-CM

## 2018-05-22 DIAGNOSIS — Z801 Family history of malignant neoplasm of trachea, bronchus and lung: Secondary | ICD-10-CM

## 2018-05-22 DIAGNOSIS — Z9049 Acquired absence of other specified parts of digestive tract: Secondary | ICD-10-CM

## 2018-05-22 LAB — CBC WITH DIFFERENTIAL/PLATELET
Abs Immature Granulocytes: 0.01 10*3/uL (ref 0.00–0.07)
Basophils Absolute: 0 10*3/uL (ref 0.0–0.1)
Basophils Relative: 1 %
Eosinophils Absolute: 0.1 10*3/uL (ref 0.0–0.5)
Eosinophils Relative: 2 %
HCT: 42.4 % (ref 39.0–52.0)
Hemoglobin: 13.9 g/dL (ref 13.0–17.0)
Immature Granulocytes: 0 %
Lymphocytes Relative: 22 %
Lymphs Abs: 0.8 10*3/uL (ref 0.7–4.0)
MCH: 33.4 pg (ref 26.0–34.0)
MCHC: 32.8 g/dL (ref 30.0–36.0)
MCV: 101.9 fL — ABNORMAL HIGH (ref 80.0–100.0)
Monocytes Absolute: 0.5 10*3/uL (ref 0.1–1.0)
Monocytes Relative: 13 %
Neutro Abs: 2.3 10*3/uL (ref 1.7–7.7)
Neutrophils Relative %: 62 %
Platelets: 183 10*3/uL (ref 150–400)
RBC: 4.16 MIL/uL — ABNORMAL LOW (ref 4.22–5.81)
RDW: 14.9 % (ref 11.5–15.5)
WBC: 3.7 10*3/uL — ABNORMAL LOW (ref 4.0–10.5)
nRBC: 0 % (ref 0.0–0.2)

## 2018-05-22 LAB — COMPREHENSIVE METABOLIC PANEL
ALT: 14 U/L (ref 0–44)
AST: 25 U/L (ref 15–41)
Albumin: 3.7 g/dL (ref 3.5–5.0)
Alkaline Phosphatase: 106 U/L (ref 38–126)
Anion gap: 13 (ref 5–15)
BUN: 7 mg/dL (ref 6–20)
CO2: 25 mmol/L (ref 22–32)
Calcium: 9 mg/dL (ref 8.9–10.3)
Chloride: 97 mmol/L — ABNORMAL LOW (ref 98–111)
Creatinine, Ser: 1.03 mg/dL (ref 0.61–1.24)
GFR calc Af Amer: >60 mL/min (ref >60)
GFR calc non Af Amer: >60 mL/min (ref >60)
Glucose, Bld: 153 mg/dL — ABNORMAL HIGH (ref 70–99)
Potassium: 3.4 mmol/L — ABNORMAL LOW (ref 3.5–5.1)
Sodium: 135 mmol/L (ref 135–145)
Total Bilirubin: 0.7 mg/dL (ref 0.3–1.2)
Total Protein: 6.9 g/dL (ref 6.5–8.1)

## 2018-05-22 MED ORDER — LEUCOVORIN CALCIUM INJECTION 350 MG: 900.0000 mg | Freq: Once | INTRAVENOUS | Status: DC

## 2018-05-22 MED ORDER — SODIUM CHLORIDE 0.9% FLUSH
10.0000 mL | INTRAVENOUS | Status: DC | PRN
  Administered 2018-05-22: 10 mL
  Filled 2018-05-22: qty 10

## 2018-05-22 MED ORDER — PALONOSETRON HCL INJECTION 0.25 MG/5ML
0.2500 mg | Freq: Once | INTRAVENOUS | Status: AC
  Administered 2018-05-22: 09:00:00 0.25 mg via INTRAVENOUS
  Filled 2018-05-22: qty 5

## 2018-05-22 MED ORDER — LEUCOVORIN CALCIUM 500 MG/50ML IJ SOLN
900.0000 mg | Freq: Once | INTRAVENOUS | Status: AC
  Administered 2018-05-22: 900 mg via INTRAVENOUS
  Filled 2018-05-22: qty 250

## 2018-05-22 MED ORDER — SODIUM CHLORIDE 0.9 % IV SOLN
10.0000 mg | Freq: Once | INTRAVENOUS | Status: AC
  Administered 2018-05-22: 10 mg via INTRAVENOUS
  Filled 2018-05-22: qty 10

## 2018-05-22 MED ORDER — FLUOROURACIL CHEMO INJECTION 2.5 GM/50ML
400.0000 mg/m2 | Freq: Once | INTRAVENOUS | Status: AC
  Administered 2018-05-22: 11:00:00 950 mg via INTRAVENOUS
  Filled 2018-05-22: qty 19

## 2018-05-22 MED ORDER — SODIUM CHLORIDE 0.9 % IV SOLN
Freq: Once | INTRAVENOUS | Status: AC
  Administered 2018-05-22: 09:00:00 via INTRAVENOUS

## 2018-05-22 MED ORDER — SODIUM CHLORIDE 0.9 % IV SOLN
2400.0000 mg/m2 | INTRAVENOUS | Status: DC
  Administered 2018-05-22: 11:00:00 5650 mg via INTRAVENOUS
  Filled 2018-05-22: qty 113

## 2018-05-22 MED ORDER — SODIUM CHLORIDE 0.9 % IV SOLN: Freq: Once | INTRAVENOUS | Status: DC

## 2018-05-22 MED ORDER — SODIUM CHLORIDE 0.9 % IV SOLN
4.8000 mg/kg | Freq: Once | INTRAVENOUS | Status: AC
  Administered 2018-05-22: 10:00:00 500 mg via INTRAVENOUS
  Filled 2018-05-22: qty 4

## 2018-05-22 MED ORDER — ATROPINE SULFATE 1 MG/ML IJ SOLN: 0.5000 mg | Freq: Once | INTRAMUSCULAR | Status: DC

## 2018-05-22 NOTE — Patient Instructions (Signed)
[facility] at [facility] Discharge Instructions  Labs drawn from portacath today   Thank you for choosing [facility] at [facility] to provide your oncology and hematology care.  To afford each patient quality time with our provider, please arrive at least 15 minutes before your scheduled appointment time.   If you have a lab appointment with the Cancer Center please come in thru the  Main Entrance and check in at the main information desk  You need to re-schedule your appointment should you arrive 10 or more minutes late.  We strive to give you quality time with our providers, and arriving late affects you and other patients whose appointments are after yours.  Also, if you no show three or more times for appointments you may be dismissed from the clinic at the providers discretion.     Again, thank you for choosing [facility].  Our hope is that these requests will decrease the amount of time that you wait before being seen by our physicians.       _____________________________________________________________  Should you have questions after your visit to [facility], please contact our office at [phone] between the hours of 8:00 a.m. and 4:30 p.m.  Voicemails left after 4:00 p.m. will not be returned until the following business day.  For prescription refill requests, have your pharmacy contact our office and allow 72 hours.    Cancer Center Support Programs:   > Cancer Support Group  2nd Tuesday of the month 1pm-2pm, Journey Room   

## 2018-05-22 NOTE — Assessment & Plan Note (Signed)
1.  Metastatic colon cancer: -Foundation 1 CDX shows MS-stable, K-ras Q 61L,TP53 S215I, NRAS wild type, APC T911f*16, FAM123B [id number] - Status post FOLFOX for 12 cycles in the adjuvant setting from 03/30/2015 through 09/06/2015. -PET CT scan on 11/19/2017 showing peritoneal carcinomatosis, right paratracheal lymph node with mild FDG uptake with mild nonspecific increased uptake in the right hilar region.  CEA was elevated at 12.9 on 11/16/2017. - 9 cycles of FOLFIRI from 01/09/2018 through 05/08/2018 with addition of bevacizumab during cycle 5.  (After having severe rash with vectibix during cycle 2)  - We discussed the results of the CT CAP from 05/20/2018 which showed minimal improvement in peritoneal metastasis.  Presumed peritoneal implant within the small bowel mesentery measures 1.5 x 1.1 cm.  A pelvic cul-de-sac implant measures 1.9 x 1.7 cm, compared to 2.2 x 1.5 cm prior. -There was an incidental right upper lobe pulmonary embolism.  We have ordered a dedicated CT PE protocol which showed bilateral pulmonary emboli. -Today we talked about starting him on maintenance therapy with 5-FU/leucovorin and bevacizumab.  This will likely help control his diarrhea. -I will see him back in 2 weeks for follow-up.  2.  Pulmonary embolism: -This was found incidentally on a CT chest done on 05/20/2018 for disease surveillance. -Dedicated CT angiogram on the same day showed bilateral pulmonary emboli.  Patient noticed some respiratory distress in the last 2 weeks. - As he did not have any severe features, we started him on Xarelto immediately.  Patient started taking the pill yesterday. -We discussed the side effects in detail.  He will switch to once a day dosing after 3 weeks.

## 2018-05-22 NOTE — Patient Instructions (Addendum)
[facility] at [facility] Discharge Instructions  You were seen today by Dr. Katragadda. He went over your recent lab results. He will see you back in 2 weeks for labs, treatment and follow up.   Thank you for choosing [facility] at [facility] to provide your oncology and hematology care.  To afford each patient quality time with our provider, please arrive at least 15 minutes before your scheduled appointment time.   If you have a lab appointment with the Cancer Center please come in thru the  Main Entrance and check in at the main information desk  You need to re-schedule your appointment should you arrive 10 or more minutes late.  We strive to give you quality time with our providers, and arriving late affects you and other patients whose appointments are after yours.  Also, if you no show three or more times for appointments you may be dismissed from the clinic at the providers discretion.     Again, thank you for choosing [facility].  Our hope is that these requests will decrease the amount of time that you wait before being seen by our physicians.       _____________________________________________________________  Should you have questions after your visit to [facility], please contact our office at [phone] between the hours of 8:00 a.m. and 4:30 p.m.  Voicemails left after 4:00 p.m. will not be returned until the following business day.  For prescription refill requests, have your pharmacy contact our office and allow 72 hours.    Cancer Center Support Programs:   > Cancer Support Group  2nd Tuesday of the month 1pm-2pm, Journey Room    

## 2018-05-22 NOTE — Progress Notes (Signed)
0900 CBCD and CMET results reviewed with and pt seen by Dr. Delton Coombes today and pt approved for chemo tx with Avastin,Leucovorin and 5FU(Irinotecan to be held) per MD order                                                   Erik [name] tolerated chemo tx well without complaints or incident. Pt discharged with 5FU pump infusing without issues. VSS upon discharge. Pt discharged self ambulatory using his cane in satisfactory condition

## 2018-05-22 NOTE — Patient Instructions (Signed)
[facility] Discharge Instructions for Patients Receiving Chemotherapy   Beginning January 23rd 2017 lab work for the Cancer Center will be done in the  Main lab at [place] on 1st floor. If you have a lab appointment with the Cancer Center please come in thru the  Main Entrance and check in at the main information desk   Today you received the following chemotherapy agents Avastin,Leucovorin and 5FU. Follow-up as scheduled. Call clinic for any questions or concerns  To help prevent nausea and vomiting after your treatment, we encourage you to take your nausea medication    If you develop nausea and vomiting, or diarrhea that is not controlled by your medication, call the clinic.  The clinic phone number is [phone]. Office hours are Monday-Friday 8:30am-5:00pm.  BELOW ARE SYMPTOMS THAT SHOULD BE REPORTED IMMEDIATELY:  *FEVER GREATER THAN 101.0 F  *CHILLS WITH OR WITHOUT FEVER  NAUSEA AND VOMITING THAT IS NOT CONTROLLED WITH YOUR NAUSEA MEDICATION  *UNUSUAL SHORTNESS OF BREATH  *UNUSUAL BRUISING OR BLEEDING  TENDERNESS IN MOUTH AND THROAT WITH OR WITHOUT PRESENCE OF ULCERS  *URINARY PROBLEMS  *BOWEL PROBLEMS  UNUSUAL RASH Items with * indicate a potential emergency and should be followed up as soon as possible. If you have an emergency after office hours please contact your primary care physician or go to the nearest emergency department.  Please call the clinic during office hours if you have any questions or concerns.   You may also contact the Patient Navigator at [phone] should you have any questions or need assistance in obtaining follow up care.      Resources For Cancer Patients and their Caregivers ? American Cancer Society: Can assist with transportation, wigs, general needs, runs Look Good Feel Better.        1-888-227-6333 ? Cancer Care: Provides financial assistance, online support groups, medication/co-pay assistance.   1-800-813-HOPE (4673) ? [facility] Assists [place] cancer patients and their families through emotional , educational and financial support.  336-427-4357 ? [place] DSS Where to apply for food stamps, Medicaid and utility assistance. 336-342-1394 ? [facility]: Transportation to medical appointments. 336-347-2287 ? Social Security Administration: May apply for disability if have a Stage IV cancer. 336-342-7796 1-800-772-1213 ? [place] Aging, Disability and Transit Services: Assists with nutrition, care and transit needs. 336-349-2343         

## 2018-05-22 NOTE — Progress Notes (Signed)
Erik [name], [place] [id number]   CLINIC:  Medical Oncology/Hematology  PCP:  Patient, No Pcp Per No address on file None   REASON FOR VISIT:  Follow-up for  Malignant neoplasm of transverse colon       BRIEF ONCOLOGIC HISTORY:  Oncology History   Stage IIIC ([id number]) adenocarcinoma of transverse colon, diagnosed on colonoscopy by Dr. Oneida Alar on 01/12/2015 followed by definitive surgery by Dr. [name] with right hemicolectomy on 02/12/2015.  He then underwent FOLFOX x 12 cycles in the adjuvant setting (03/30/2015- 09/06/2015).     Malignant neoplasm of transverse colon ([place])   01/12/2015 Pathologic Stage    Colon, biopsy, distal transverse - TUBULOVILLOUS ADENOMA WITH HIGH GRADE DYSPLASIA.    01/12/2015 Procedure    Colonoscopy by Dr. Oneida Alar.    01/12/2015 Tumor Marker    CEA: 6.6 (H)     01/18/2015 Imaging    CT abd/pelvis- Apple-core lesion identified in the mid transverse colon without obstruction. No evidence for lymphadenopathy in the gastrohepatic ligament or omentum.  Stable 8 mm hypo attenuating lesion in the left liver, likely a cyst.    02/10/2015 Initial Diagnosis    Adenocarcinoma of transverse colon ([place])    02/12/2015 Definitive Surgery    [name], Extended right hemicolectomy     02/12/2015 Pathology Results    Mucinous adenocarcinoma with penetration of visceral peritoneum, 4/19 lymph nodes for metastatic disease, negative resection margins, with LVI and perineural invasion    03/30/2015 - 09/06/2015 Chemotherapy    FOLFOX x 12 cycles    05/25/2015 Treatment Plan Change    5 FU bolus discontinued for cycle #5    06/08/2015 Treatment Plan Change    Treatment deferred x 1 week    06/15/2015 Treatment Plan Change    5FU CI decreased by 10% and Oxaliplatin reduced by 15% for cycles #6-#11; Oxaliplatin dropped for cycle #12 d/t neuropathy.     10/20/2015 Imaging    CT CAP- Right hemicolectomy without evidence of  metastatic disease. 2. Previously measured ground-glass lesion in the left upper lobe has resolved. 3. Probable food debris in the stomach, simulating gastric wall thickening. Please correlate clinically. 4. 6 mm irregular nodular density in the left upper lobe, stable. Continued attention on followup exams is warranted.    01/25/2016 Procedure    Colonoscopy by Dr. Oneida Alar- Non-thrombosed external hemorrhoids found on digital rectal exam. - One 4 mm polyp in the rectum, removed with a cold biopsy forceps. Resected and retrieved. - Congested mucosa in the neo-terminal ileum. Biopsied. - Redundant colon. - Internal hemorrhoids.    01/26/2016 Pathology Results    1. Terminal ileum, biopsy - MILD ACUTE (ACTIVE) ILEITIS. - NO DYSPLASIA OR MALIGNANCY IDENTIFIED - SEE COMMENT. 2. Rectum, polyp(s) - HYPERPLASTIC POLYP (X 1). - NO DYSPLASIA OR MALIGNANCY IDENTIFIED.    04/13/2016 Imaging    CT chest- Stable CT chest. 6 mm irregular nodule anterior left upper lobe is stable. The scattered areas of peribronchovascular micro nodularity in the lungs bilaterally are unchanged.    10/19/2016 Imaging    CT CAP: 1. Status post right hemicolectomy. No findings to suggest metastatic disease in the abdomen or pelvis. 2. Aortic atherosclerosis. 3. Additional incidental findings, as above. Aortic Atherosclerosis (ICD10-I70.0).    01/09/2018 -  Chemotherapy    The patient had palonosetron (ALOXI) injection 0.25 mg, 0.25 mg, Intravenous,  Once, 10 of 14 cycles Administration: 0.25 mg (01/09/2018), 0.25 mg (01/23/2018), 0.25 mg (02/11/2018),  0.25 mg (02/25/2018), 0.25 mg (03/12/2018), 0.25 mg (03/27/2018), 0.25 mg (04/10/2018), 0.25 mg (04/24/2018), 0.25 mg (05/08/2018), 0.25 mg (05/22/2018) irinotecan (CAMPTOSAR) 420 mg in dextrose 5 % 500 mL chemo infusion, 180 mg/m2 = 420 mg, Intravenous,  Once, 9 of 9 cycles Administration: 420 mg (01/09/2018), 420 mg (01/23/2018), 420 mg (02/11/2018), 420 mg (02/25/2018), 420 mg  (03/12/2018), 420 mg (03/27/2018), 420 mg (04/10/2018), 420 mg (04/24/2018), 420 mg (05/08/2018) leucovorin 900 mg in dextrose 5 % 250 mL infusion, 944 mg, Intravenous,  Once, 10 of 14 cycles Administration: 900 mg (01/09/2018), 900 mg (01/23/2018), 900 mg (02/11/2018), 900 mg (02/25/2018), 900 mg (03/12/2018), 900 mg (03/27/2018), 900 mg (04/10/2018), 900 mg (04/24/2018), 900 mg (05/08/2018) fluorouracil (ADRUCIL) chemo injection 950 mg, 400 mg/m2 = 950 mg, Intravenous,  Once, 10 of 14 cycles Administration: 950 mg (01/09/2018), 950 mg (01/23/2018), 950 mg (02/11/2018), 950 mg (02/25/2018), 950 mg (03/12/2018), 950 mg (03/27/2018), 950 mg (04/10/2018), 950 mg (04/24/2018), 950 mg (05/08/2018), 950 mg (05/22/2018) fluorouracil (ADRUCIL) 5,650 mg in sodium chloride 0.9 % 137 mL chemo infusion, 2,400 mg/m2 = 5,650 mg, Intravenous, 1 Day/Dose, 10 of 14 cycles Administration: 5,650 mg (01/09/2018), 5,650 mg (01/23/2018), 5,650 mg (02/11/2018), 5,650 mg (02/25/2018), 5,650 mg (03/12/2018), 5,650 mg (03/27/2018), 5,650 mg (04/10/2018), 5,650 mg (04/24/2018), 5,650 mg (05/08/2018), 5,650 mg (05/22/2018)  for chemotherapy treatment.     01/23/2018 Cancer Staging    Staging form: Colon and Rectum, AJCC 7th Edition - Pathologic: M1 - Signed by Zoila Shutter, MD on 01/23/2018     Malignant neoplasm of overlapping sites of colon ([place])   12/26/2017 Initial Diagnosis    Cancer of overlapping sites of colon metastatic to intra-abdominal lymph node ([place])    01/09/2018 -  Chemotherapy    The patient had palonosetron (ALOXI) injection 0.25 mg, 0.25 mg, Intravenous,  Once, 10 of 14 cycles Administration: 0.25 mg (01/09/2018), 0.25 mg (01/23/2018), 0.25 mg (02/11/2018), 0.25 mg (02/25/2018), 0.25 mg (03/12/2018), 0.25 mg (03/27/2018), 0.25 mg (04/10/2018), 0.25 mg (04/24/2018), 0.25 mg (05/08/2018), 0.25 mg (05/22/2018) irinotecan (CAMPTOSAR) 420 mg in dextrose 5 % 500 mL chemo infusion, 180 mg/m2 = 420 mg, Intravenous,  Once, 9 of 9 cycles Administration: 420 mg  (01/09/2018), 420 mg (01/23/2018), 420 mg (02/11/2018), 420 mg (02/25/2018), 420 mg (03/12/2018), 420 mg (03/27/2018), 420 mg (04/10/2018), 420 mg (04/24/2018), 420 mg (05/08/2018) leucovorin 900 mg in dextrose 5 % 250 mL infusion, 944 mg, Intravenous,  Once, 10 of 14 cycles Administration: 900 mg (01/09/2018), 900 mg (01/23/2018), 900 mg (02/11/2018), 900 mg (02/25/2018), 900 mg (03/12/2018), 900 mg (03/27/2018), 900 mg (04/10/2018), 900 mg (04/24/2018), 900 mg (05/08/2018) fluorouracil (ADRUCIL) chemo injection 950 mg, 400 mg/m2 = 950 mg, Intravenous,  Once, 10 of 14 cycles Administration: 950 mg (01/09/2018), 950 mg (01/23/2018), 950 mg (02/11/2018), 950 mg (02/25/2018), 950 mg (03/12/2018), 950 mg (03/27/2018), 950 mg (04/10/2018), 950 mg (04/24/2018), 950 mg (05/08/2018), 950 mg (05/22/2018) fluorouracil (ADRUCIL) 5,650 mg in sodium chloride 0.9 % 137 mL chemo infusion, 2,400 mg/m2 = 5,650 mg, Intravenous, 1 Day/Dose, 10 of 14 cycles Administration: 5,650 mg (01/09/2018), 5,650 mg (01/23/2018), 5,650 mg (02/11/2018), 5,650 mg (02/25/2018), 5,650 mg (03/12/2018), 5,650 mg (03/27/2018), 5,650 mg (04/10/2018), 5,650 mg (04/24/2018), 5,650 mg (05/08/2018), 5,650 mg (05/22/2018)  for chemotherapy treatment.      Colon cancer metastasized to multiple sites [facility])   01/16/2018 Initial Diagnosis    Colon cancer metastasized to multiple sites [facility])    01/23/2018 - 02/10/2018 Chemotherapy    The patient had panitumumab (VECTIBIX)  600 mg in sodium chloride 0.9 % 100 mL chemo infusion, 640 mg, Intravenous,  Once, 1 of 3 cycles Administration: 600 mg (01/23/2018)  for chemotherapy treatment.     03/12/2018 -  Chemotherapy    The patient had bevacizumab (AVASTIN) 500 mg in sodium chloride 0.9 % 100 mL chemo infusion, 525 mg, Intravenous,  Once, 6 of 7 cycles Administration: 500 mg (03/12/2018), 500 mg (03/27/2018), 500 mg (04/10/2018), 500 mg (04/24/2018), 500 mg (05/08/2018), 500 mg (05/22/2018)  for chemotherapy treatment.       CANCER STAGING: Cancer  Staging Malignant neoplasm of transverse colon [facility]) Staging form: Colon and Rectum, AJCC 7th Edition - Pathologic stage from 03/04/2015: Stage IIIC (T4a, N2a, cM0) - Signed by Baird Cancer, PA-C on 03/04/2015 - Pathologic: M1 - Signed by Zoila Shutter, MD on 01/23/2018    INTERVAL HISTORY:  Erik [name] 63 y.o. male returns for routine follow-up and consideration for next cycle of chemotherapy. He is here today alone. He states that he has noticed an increase in shortness of breath. Denies any nausea, vomiting, or diarrhea. Denies any new pains. Had not noticed any recent bleeding such as epistaxis, hematuria or hematochezia. Denies recent chest pain on exertion, pre-syncopal episodes, or palpitations. Denies any numbness or tingling in hands or feet. Denies any recent fevers, infections, or recent hospitalizations. Patient reports appetite at 25% and energy level at 25%.   REVIEW OF SYSTEMS:  Review of Systems  Respiratory: Positive for shortness of breath.   Gastrointestinal: Positive for diarrhea and nausea.  Neurological: Positive for dizziness.  Psychiatric/Behavioral: Positive for sleep disturbance.     PAST MEDICAL/SURGICAL HISTORY:  Past Medical History:  Diagnosis Date   Abnormal stress echocardiogram    Adenocarcinoma of transverse colon ([place]) 02/10/2015   Alcohol abuse    Heavy Use up until 2010   Anxiety    Blood transfusion without reported diagnosis    Cancer of overlapping sites of colon metastatic to intra-abdominal lymph node ([place]) 12/26/2017   COPD (chronic obstructive pulmonary disease) (H[facility])    Depression    Emphysema of lung ([facility])    GERD (gastroesophageal reflux disease)    Head trauma 2001   closed head injury; coma for 4 weeks   Hypercholesterolemia    Hypertension    ING HERN W/GANGREN RECUR UNILAT/UNSPEC ING HERN 04/21/2009   Qualifier: Diagnosis of  By: Verl Blalock, MD, [place] [facility]    PTSD (post-traumatic stress disorder)    Pulmonary  nodule 06/14/2016   SAH (subarachnoid hemorrhage) ([place]) 08/31/2012   SDH (subdural hematoma) ([place]) 08/31/2012   Past Surgical History:  Procedure Laterality Date   BIOPSY  01/25/2016   Procedure: BIOPSY;  Surgeon: Danie Binder, MD;  [facility];  Service: Endoscopy;;  ileum;    cardiac cath     COLONOSCOPY  2011   Dr. Oneida Alar: multiple adenomas and hyperplastic polyps   COLONOSCOPY WITH PROPOFOL N/A 01/12/2015   Procedure: COLONOSCOPY WITH PROPOFOL;  Surgeon: Danie Binder, MD;  [facility];  Service: Endoscopy;  Laterality: N/A;  1030   COLONOSCOPY WITH PROPOFOL N/A 01/25/2016   Procedure: COLONOSCOPY WITH PROPOFOL;  Surgeon: Danie Binder, MD;  [facility];  Service: Endoscopy;  Laterality: N/A;  [place]   ESOPHAGOGASTRODUODENOSCOPY (EGD) WITH PROPOFOL N/A 01/12/2015   Procedure: ESOPHAGOGASTRODUODENOSCOPY (EGD) WITH PROPOFOL;  Surgeon: Danie Binder, MD;  [facility];  Service: Endoscopy;  Laterality: N/A;   head  injury surgery     HERNIA REPAIR Right 2012   Inguinal- [facility]   KIDNEY SURGERY     >30 years ago   LAPAROSCOPIC RIGHT HEMI COLECTOMY Left 02/10/2015   Procedure: LAPAROSCOPIC THEN OPEN RIGHT HEMI COLECTOMY;  Surgeon: [name], MD;  Location: [facility] ORS;  Service: General;  Laterality: Left;   LAPAROTOMY N/A 12/19/2017   Procedure: EXPLORATORY LAPAROTOMY;  Surgeon: Aviva Signs, MD;  Location: [facility];  Service: General;  Laterality: N/A;   POLYPECTOMY  01/25/2016   Procedure: POLYPECTOMY;  Surgeon: Danie Binder, MD;  [facility];  Service: Endoscopy;;  colon   PORT-A-CATH REMOVAL N/A 02/26/2017   Procedure: MINOR REMOVAL PORT-A-CATH;  Surgeon: Aviva Signs, MD;  Location: [facility];  Service: General;  Laterality: N/A;  Pt to arrive at [place] N/A 03/24/2015   Procedure: [place];  Surgeon: Jules Husbands, MD;  Location: [facility] ORS;  Service:  General;  Laterality: N/A;   PORTACATH PLACEMENT Left 01/07/2018   Procedure: INSERTION PORT A CATH (ATTACHED CATHETER IN LEFT SUBCLAVIAN);  Surgeon: Aviva Signs, MD;  Location: [facility];  Service: General;  Laterality: Left;     SOCIAL HISTORY:  Social History   Socioeconomic History   Marital status: Divorced    Spouse name: Not on file   Number of children: 2   Years of education: 12   Highest education level: Not on file  Occupational History   Occupation: disabled/unemployed   Occupation: unemployed/Mediicaid only    Comment: NO INCOME  Social Designer, fashion/clothing strain: Very hard   Food insecurity:    Worry: Often true    Inability: Often true   Transportation needs:    Medical: Yes    Non-medical: No  Tobacco Use   Smoking status: Former Smoker    Packs/day: 0.10    Years: 30.00    Pack years: 3.00    Types: Cigarettes    Start date: 02/06/1973    Last attempt to quit: 12/09/2017    Years since quitting: 0.4   Smokeless tobacco: Never Used   Tobacco comment: Quit on 12/09/2017  Substance and Sexual Activity   Alcohol use: Yes    Alcohol/week: 0.0 standard drinks    Comment: beer occ, history of ETOH abuse in remote past.    Drug use: No   Sexual activity: Never    Partners: Female    Birth control/protection: None  Lifestyle   Physical activity:    Days per week: Not on file    Minutes per session: Not on file   Stress: Not on file  Relationships   Social connections:    Talks on phone: Not on file    Gets together: Not on file    Attends religious service: Not on file    Active member of club or organization: Not on file    Attends meetings of clubs or organizations: Not on file    Relationship status: Not on file   Intimate partner violence:    Fear of current or ex partner: Not on file    Emotionally abused: Not on file    Physically abused: Not on file    Forced sexual activity: Not on file  Other Topics Concern   Not  on file  Social History Narrative   Single   Lives alone   Turned down for disability    FAMILY HISTORY:  Family History  Problem Relation Age of Onset  Pulmonary embolism Mother    Breast cancer Mother    Cancer Mother        breast cancer   Arthritis Mother    Heart disease Father    Cancer Father 78       Leukemia   Cancer Paternal Grandfather        Lung   Ataxia Neg Hx    Chorea Neg Hx    Dementia Neg Hx    Mental retardation Neg Hx    Migraines Neg Hx    Multiple sclerosis Neg Hx    Neurofibromatosis Neg Hx    Neuropathy Neg Hx    Parkinsonism Neg Hx    Seizures Neg Hx    Stroke Neg Hx    Colon cancer Neg Hx     CURRENT MEDICATIONS:  Outpatient Encounter Medications as of 05/22/2018  Medication Sig   amitriptyline (ELAVIL) 25 MG tablet Take 1 tablet (25 mg total) by mouth at bedtime.   atorvastatin (LIPITOR) 20 MG tablet Take 1 tablet (20 mg total) by mouth at bedtime. (Patient taking differently: Take 20 mg by mouth every morning. )   buPROPion (WELLBUTRIN SR) 150 MG 12 hr tablet Take 150 mg by mouth every morning.    busPIRone (BUSPAR) 10 MG tablet Take 10 mg by mouth 2 (two) times daily.   fluorouracil CALGB 27782 in sodium chloride 0.9 % 150 mL Inject 5,650 mg into the vein. Over 46 hours   FLUoxetine (PROZAC) 20 MG capsule TAKE 1 CAPSULE BY MOUTH 2 TIMES A DAY (Patient taking differently: Take 20 mg by mouth 2 (two) times daily. )   gabapentin (NEURONTIN) 300 MG capsule TAKE 1 CAPSULE BY MOUTH 3 TIMES DAILY   IRINOTECAN HCL IV Inject 420 mg into the vein every 14 (fourteen) days.   LEUCOVORIN CALCIUM IV Inject 944 mg into the vein every 14 (fourteen) days.   lidocaine (XYLOCAINE) 2 % solution Use as directed 15 mLs in the mouth or throat every 4 (four) hours as needed for mouth pain (as an aid to eat and drink).   lidocaine-prilocaine (EMLA) cream APPLY A SMALL AMOUNT OVER PORT SITE AND COVER WITH PLASTIC WRAP ONE HOUR PRIOR TO  APPOINTMENT   loperamide (IMODIUM) 2 MG capsule TAKE 1-2 CAPSULES BY MOUTH EVERY 6 HOURS AS NEEDED FOR DIARRHEA OR LOOSE STOOLS   omeprazole (PRILOSEC) 20 MG capsule Take 20 mg by mouth every morning.    ondansetron (ZOFRAN) 8 MG tablet Take 1 tablet (8 mg total) by mouth 2 (two) times daily as needed for refractory nausea / vomiting. Start on day 3 after chemotherapy.   PROAIR HFA 108 (90 Base) MCG/ACT inhaler INHALE 2 PUFFS BY MOUTH EVERY 6 HOURS AS NEEDED FOR SHORTNESS OF BREATH/WHEEZING. (Patient taking differently: Inhale 2 puffs into the lungs every 6 (six) hours as needed for wheezing or shortness of breath. )   prochlorperazine (COMPAZINE) 10 MG tablet Take 1 tablet (10 mg total) by mouth every 6 (six) hours as needed (NAUSEA).   Rivaroxaban 15 & 20 MG TBPK Take 1 tablet twice daily for 21 days. Then switch to 20 mg tablet and take 1 tablet daily.   triamcinolone ointment (KENALOG) 0.5 % Apply 1 application topically 2 (two) times daily.   No facility-administered encounter medications on file as of 05/22/2018.     ALLERGIES:  No Known Allergies   PHYSICAL EXAM:  ECOG Performance status: 1  Vitals:   05/22/18 0754  BP: 127/70  Pulse: 100  Resp:  18  Temp: 98.4 F (36.9 C)  SpO2: 97%   Filed Weights   05/22/18 0754  Weight: 232 lb 6.4 oz (105.4 kg)    Physical Exam Vitals signs reviewed.  Constitutional:      Appearance: Normal appearance.  Cardiovascular:     Rate and Rhythm: Normal rate and regular rhythm.     Heart sounds: Normal heart sounds.  Pulmonary:     Effort: Pulmonary effort is normal.     Breath sounds: Normal breath sounds.  Abdominal:     General: There is no distension.     Palpations: Abdomen is soft. There is no mass.  Musculoskeletal:        General: No swelling.  Skin:    General: Skin is warm.  Neurological:     General: No focal deficit present.     Mental Status: He is alert and oriented to person, place, and time.  Psychiatric:         Mood and Affect: Mood normal.        Behavior: Behavior normal.      LABORATORY DATA:  I have reviewed the labs as listed.  CBC    Component Value Date/Time   WBC 3.7 (L) 05/22/2018 0805   RBC 4.16 (L) 05/22/2018 0805   HGB 13.9 05/22/2018 0805   HCT 42.4 05/22/2018 0805   PLT 183 05/22/2018 0805   MCV 101.9 (H) 05/22/2018 0805   MCH 33.4 05/22/2018 0805   MCHC 32.8 05/22/2018 0805   RDW 14.9 05/22/2018 0805   LYMPHSABS 0.8 05/22/2018 0805   MONOABS 0.5 05/22/2018 0805   EOSABS 0.1 05/22/2018 0805   BASOSABS 0.0 05/22/2018 0805   CMP Latest Ref Rng & Units 05/22/2018 05/08/2018 04/24/2018  Glucose 70 - 99 mg/dL 153(H) 134(H) 113(H)  BUN 6 - 20 mg/dL '7 13 12  ' Creatinine 0.61 - 1.24 mg/dL 1.03 0.90 0.72  Sodium 135 - 145 mmol/L 135 138 140  Potassium 3.5 - 5.1 mmol/L 3.4(L) 3.6 3.8  Chloride 98 - 111 mmol/L 97(L) 105 104  CO2 22 - 32 mmol/L '25 22 27  ' Calcium 8.9 - 10.3 mg/dL 9.0 8.6(L) 8.8(L)  Total Protein 6.5 - 8.1 g/dL 6.9 6.6 6.8  Total Bilirubin 0.3 - 1.2 mg/dL 0.7 0.3 0.6  Alkaline Phos 38 - 126 U/L 106 65 67  AST 15 - 41 U/L '25 15 20  ' ALT 0 - 44 U/L '14 14 24       ' DIAGNOSTIC IMAGING:  I have independently reviewed the scans and discussed with the patient.   I have reviewed Venita Lick LPN's note and agree with the documentation.  I personally performed a face-to-face visit, made revisions and my assessment and plan is as follows.    ASSESSMENT & PLAN:   Colon cancer metastasized to multiple sites ([place]) 1.  Metastatic colon cancer: -Foundation 1 CDX shows MS-stable, K-ras Q 61L,TP53 S215I, NRAS wild type, APC T986f*16, FAM123B R214 [id number] - Status post FOLFOX for 12 cycles in the adjuvant setting from 03/30/2015 through 09/06/2015. -PET CT scan on 11/19/2017 showing peritoneal carcinomatosis, right paratracheal lymph node with mild FDG uptake with mild nonspecific increased uptake in the right hilar region.  CEA was elevated at 12.9 on 11/16/2017. - 9  cycles of FOLFIRI from 01/09/2018 through 05/08/2018 with addition of bevacizumab during cycle 5.  (After having severe rash with vectibix during cycle 2)  - We discussed the results of the CT CAP from 05/20/2018 which showed minimal improvement in peritoneal metastasis.  Presumed peritoneal implant within the small bowel mesentery measures 1.5 x 1.1 cm.  A pelvic cul-de-sac implant measures 1.9 x 1.7 cm, compared to 2.2 x 1.5 cm prior. -There was an incidental right upper lobe pulmonary embolism.  We have ordered a dedicated CT PE protocol which showed bilateral pulmonary emboli. -Today we talked about starting him on maintenance therapy with 5-FU/leucovorin and bevacizumab.  This will likely help control his diarrhea. -I will see him back in 2 weeks for follow-up.  2.  Pulmonary embolism: -This was found incidentally on a CT chest done on 05/20/2018 for disease surveillance. -Dedicated CT angiogram on the same day showed bilateral pulmonary emboli.  Patient noticed some respiratory distress in the last 2 weeks. - As he did not have any severe features, we started him on Xarelto immediately.  Patient started taking the pill yesterday. -We discussed the side effects in detail.  He will switch to once a day dosing after 3 weeks.    Total time spent is 40 minutes with more than 50% of the time spent face-to-face discussing scan results, new diagnosis of pulmonary embolism, treatment options and coordination of care.  Orders placed this encounter:  No orders of the defined types were placed in this encounter.     Derek Jack, MD [place] [phone]

## 2018-05-23 LAB — CEA: CEA: 5.3 ng/mL — ABNORMAL HIGH (ref 0.0–4.7)

## 2018-05-24 ENCOUNTER — Other Ambulatory Visit: Payer: Self-pay

## 2018-05-24 ENCOUNTER — Inpatient Hospital Stay (HOSPITAL_COMMUNITY): Payer: Medicaid Other

## 2018-05-24 DIAGNOSIS — C184 Malignant neoplasm of transverse colon: Secondary | ICD-10-CM

## 2018-05-24 DIAGNOSIS — C188 Malignant neoplasm of overlapping sites of colon: Secondary | ICD-10-CM

## 2018-05-24 DIAGNOSIS — Z5111 Encounter for antineoplastic chemotherapy: Secondary | ICD-10-CM | POA: Diagnosis not present

## 2018-05-24 DIAGNOSIS — E538 Deficiency of other specified B group vitamins: Secondary | ICD-10-CM

## 2018-05-24 MED ORDER — CYANOCOBALAMIN 1000 MCG/ML IJ SOLN
INTRAMUSCULAR | Status: AC
  Filled 2018-05-24: qty 1

## 2018-05-24 MED ORDER — HEPARIN SOD (PORK) LOCK FLUSH 100 UNIT/ML IV SOLN
500.0000 [IU] | Freq: Once | INTRAVENOUS | Status: AC | PRN
  Administered 2018-05-24: 11:00:00 500 [IU]

## 2018-05-24 MED ORDER — CYANOCOBALAMIN 1000 MCG/ML IJ SOLN
1000.0000 ug | Freq: Once | INTRAMUSCULAR | Status: AC
  Administered 2018-05-24: 1000 ug via INTRAMUSCULAR

## 2018-05-24 MED ORDER — SODIUM CHLORIDE 0.9% FLUSH
10.0000 mL | INTRAVENOUS | Status: DC | PRN
  Administered 2018-05-24: 10 mL
  Filled 2018-05-24: qty 10

## 2018-05-24 NOTE — Patient Instructions (Signed)
[facility] at [facility]  Discharge Instructions:   _______________________________________________________________  Thank you for choosing [facility] at [facility] to provide your oncology and hematology care.  To afford each patient quality time with our providers, please arrive at least 15 minutes before your scheduled appointment.  You need to re-schedule your appointment if you arrive 10 or more minutes late.  We strive to give you quality time with our providers, and arriving late affects you and other patients whose appointments are after yours.  Also, if you no show three or more times for appointments you may be dismissed from the clinic.  Again, thank you for choosing [facility] at [facility]. Our hope is that these requests will allow you access to exceptional care and in a timely manner. _______________________________________________________________  If you have questions after your visit, please contact our office at [phone] between the hours of 8:30 a.m. and 5:00 p.m. Voicemails left after 4:30 p.m. will not be returned until the following business day. _______________________________________________________________  For prescription refill requests, have your pharmacy contact our office. _______________________________________________________________  Recommendations made by the consultant and any test results will be sent to your referring physician. _______________________________________________________________ 

## 2018-05-24 NOTE — Progress Notes (Signed)
Erik [name] returns today for port de access and flush after 46 hr continous infusion of 49fu. Tolerated infusion without problems. Portacath located left chest wall was  deaccessed and flushed with 82ml NS and 500U/98ml Heparin and needle removed intact.  Procedure without incident. Patient tolerated procedure well.  B12 injection given per orders today as well.

## 2018-05-29 ENCOUNTER — Other Ambulatory Visit: Payer: Self-pay | Admitting: Hematology

## 2018-06-05 ENCOUNTER — Other Ambulatory Visit (HOSPITAL_COMMUNITY): Payer: Self-pay | Admitting: Hematology

## 2018-06-05 DIAGNOSIS — C184 Malignant neoplasm of transverse colon: Secondary | ICD-10-CM

## 2018-06-05 DIAGNOSIS — G620 Drug-induced polyneuropathy: Secondary | ICD-10-CM

## 2018-06-05 DIAGNOSIS — T451X5A Adverse effect of antineoplastic and immunosuppressive drugs, initial encounter: Principal | ICD-10-CM

## 2018-06-10 ENCOUNTER — Inpatient Hospital Stay (HOSPITAL_COMMUNITY): Payer: Medicaid Other

## 2018-06-10 ENCOUNTER — Other Ambulatory Visit: Payer: Self-pay

## 2018-06-10 ENCOUNTER — Encounter (HOSPITAL_COMMUNITY): Payer: Self-pay | Admitting: Hematology

## 2018-06-10 ENCOUNTER — Inpatient Hospital Stay (HOSPITAL_COMMUNITY): Payer: Medicaid Other | Attending: Hematology

## 2018-06-10 ENCOUNTER — Inpatient Hospital Stay (HOSPITAL_BASED_OUTPATIENT_CLINIC_OR_DEPARTMENT_OTHER): Payer: Medicaid Other | Admitting: Hematology

## 2018-06-10 VITALS — BP 123/73 | HR 103 | Temp 98.2°F | Resp 18

## 2018-06-10 DIAGNOSIS — C184 Malignant neoplasm of transverse colon: Secondary | ICD-10-CM

## 2018-06-10 DIAGNOSIS — C772 Secondary and unspecified malignant neoplasm of intra-abdominal lymph nodes: Secondary | ICD-10-CM | POA: Insufficient documentation

## 2018-06-10 DIAGNOSIS — C188 Malignant neoplasm of overlapping sites of colon: Secondary | ICD-10-CM

## 2018-06-10 DIAGNOSIS — I2699 Other pulmonary embolism without acute cor pulmonale: Secondary | ICD-10-CM | POA: Insufficient documentation

## 2018-06-10 DIAGNOSIS — C786 Secondary malignant neoplasm of retroperitoneum and peritoneum: Secondary | ICD-10-CM

## 2018-06-10 DIAGNOSIS — Z5111 Encounter for antineoplastic chemotherapy: Secondary | ICD-10-CM | POA: Insufficient documentation

## 2018-06-10 DIAGNOSIS — Z5112 Encounter for antineoplastic immunotherapy: Secondary | ICD-10-CM | POA: Diagnosis present

## 2018-06-10 DIAGNOSIS — C189 Malignant neoplasm of colon, unspecified: Secondary | ICD-10-CM

## 2018-06-10 LAB — COMPREHENSIVE METABOLIC PANEL
ALT: 12 U/L (ref 0–44)
AST: 19 U/L (ref 15–41)
Albumin: 3.5 g/dL (ref 3.5–5.0)
Alkaline Phosphatase: 91 U/L (ref 38–126)
Anion gap: 11 (ref 5–15)
BUN: 5 mg/dL — ABNORMAL LOW (ref 6–20)
CO2: 24 mmol/L (ref 22–32)
Calcium: 8.8 mg/dL — ABNORMAL LOW (ref 8.9–10.3)
Chloride: 100 mmol/L (ref 98–111)
Creatinine, Ser: 0.96 mg/dL (ref 0.61–1.24)
GFR calc Af Amer: >60 mL/min (ref >60)
GFR calc non Af Amer: >60 mL/min (ref >60)
Glucose, Bld: 163 mg/dL — ABNORMAL HIGH (ref 70–99)
Potassium: 4.3 mmol/L (ref 3.5–5.1)
Sodium: 135 mmol/L (ref 135–145)
Total Bilirubin: 0.3 mg/dL (ref 0.3–1.2)
Total Protein: 7 g/dL (ref 6.5–8.1)

## 2018-06-10 LAB — CBC WITH DIFFERENTIAL/PLATELET
Abs Immature Granulocytes: 0.04 10*3/uL (ref 0.00–0.07)
Basophils Absolute: 0.1 10*3/uL (ref 0.0–0.1)
Basophils Relative: 1 %
Eosinophils Absolute: 0 10*3/uL (ref 0.0–0.5)
Eosinophils Relative: 1 %
HCT: 44.2 % (ref 39.0–52.0)
Hemoglobin: 14.4 g/dL (ref 13.0–17.0)
Immature Granulocytes: 1 %
Lymphocytes Relative: 22 %
Lymphs Abs: 1 10*3/uL (ref 0.7–4.0)
MCH: 33.3 pg (ref 26.0–34.0)
MCHC: 32.6 g/dL (ref 30.0–36.0)
MCV: 102.3 fL — ABNORMAL HIGH (ref 80.0–100.0)
Monocytes Absolute: 1.1 10*3/uL — ABNORMAL HIGH (ref 0.1–1.0)
Monocytes Relative: 24 %
Neutro Abs: 2.2 10*3/uL (ref 1.7–7.7)
Neutrophils Relative %: 51 %
Platelets: 216 10*3/uL (ref 150–400)
RBC: 4.32 MIL/uL (ref 4.22–5.81)
RDW: 14.6 % (ref 11.5–15.5)
WBC: 4.4 10*3/uL (ref 4.0–10.5)
nRBC: 0 % (ref 0.0–0.2)

## 2018-06-10 LAB — URINALYSIS, DIPSTICK ONLY
Bilirubin Urine: NEGATIVE
Glucose, UA: NEGATIVE mg/dL
Hgb urine dipstick: NEGATIVE
Ketones, ur: NEGATIVE mg/dL
Leukocytes,Ua: NEGATIVE
Nitrite: NEGATIVE
Protein, ur: NEGATIVE mg/dL
Specific Gravity, Urine: 1.008 (ref 1.005–1.030)
pH: 5 (ref 5.0–8.0)

## 2018-06-10 MED ORDER — ATROPINE SULFATE 1 MG/ML IJ SOLN
0.5000 mg | Freq: Once | INTRAMUSCULAR | Status: AC
  Administered 2018-06-10: 0.5 mg via INTRAVENOUS
  Filled 2018-06-10: qty 1

## 2018-06-10 MED ORDER — SODIUM CHLORIDE 0.9 % IV SOLN: Freq: Once | INTRAVENOUS | Status: DC

## 2018-06-10 MED ORDER — LEUCOVORIN CALCIUM INJECTION 350 MG
900.0000 mg | Freq: Once | INTRAVENOUS | Status: AC
  Administered 2018-06-10: 900 mg via INTRAVENOUS
  Filled 2018-06-10: qty 45

## 2018-06-10 MED ORDER — SODIUM CHLORIDE 0.9 % IV SOLN
2400.0000 mg/m2 | INTRAVENOUS | Status: DC
  Administered 2018-06-10: 5650 mg via INTRAVENOUS
  Filled 2018-06-10: qty 113

## 2018-06-10 MED ORDER — SODIUM CHLORIDE 0.9% FLUSH
10.0000 mL | INTRAVENOUS | Status: DC | PRN
  Administered 2018-06-10: 10 mL
  Filled 2018-06-10: qty 10

## 2018-06-10 MED ORDER — SODIUM CHLORIDE 0.9 % IV SOLN
4.8000 mg/kg | Freq: Once | INTRAVENOUS | Status: AC
  Administered 2018-06-10: 500 mg via INTRAVENOUS
  Filled 2018-06-10: qty 16

## 2018-06-10 MED ORDER — LOPERAMIDE HCL 2 MG PO CAPS: ORAL_CAPSULE | ORAL | 3 refills | Status: DC

## 2018-06-10 MED ORDER — FLUOROURACIL CHEMO INJECTION 2.5 GM/50ML
400.0000 mg/m2 | Freq: Once | INTRAVENOUS | Status: AC
  Administered 2018-06-10: 950 mg via INTRAVENOUS
  Filled 2018-06-10: qty 19

## 2018-06-10 MED ORDER — PALONOSETRON HCL INJECTION 0.25 MG/5ML
0.2500 mg | Freq: Once | INTRAVENOUS | Status: AC
  Administered 2018-06-10: 0.25 mg via INTRAVENOUS
  Filled 2018-06-10: qty 5

## 2018-06-10 MED ORDER — SODIUM CHLORIDE 0.9 % IV SOLN
Freq: Once | INTRAVENOUS | Status: AC
  Administered 2018-06-10: 09:00:00 via INTRAVENOUS

## 2018-06-10 MED ORDER — SODIUM CHLORIDE 0.9 % IV SOLN
10.0000 mg | Freq: Once | INTRAVENOUS | Status: AC
  Administered 2018-06-10: 10 mg via INTRAVENOUS
  Filled 2018-06-10: qty 10

## 2018-06-10 NOTE — Patient Instructions (Signed)
[facility] Discharge Instructions for Patients Receiving Chemotherapy   Beginning January 23rd 2017 lab work for the [facility] will be done in the  Main lab at [facility] on 1st floor. If you have a lab appointment with the [place] [facility] please come in thru the  Main Entrance and check in at the main information desk   Today you received the following chemotherapy agents Oxaliplatin,Leucovorin,and 5FU. Follow-up as scheduled. Call clinic for any questions or concerns  To help prevent nausea and vomiting after your treatment, we encourage you to take your nausea medication   If you develop nausea and vomiting, or diarrhea that is not controlled by your medication, call the clinic.  The clinic phone number is [phone]. Office hours are Monday-Friday 8:30am-5:00pm.  BELOW ARE SYMPTOMS THAT SHOULD BE REPORTED IMMEDIATELY:  *FEVER GREATER THAN 101.0 F  *CHILLS WITH OR WITHOUT FEVER  NAUSEA AND VOMITING THAT IS NOT CONTROLLED WITH YOUR NAUSEA MEDICATION  *UNUSUAL SHORTNESS OF BREATH  *UNUSUAL BRUISING OR BLEEDING  TENDERNESS IN MOUTH AND THROAT WITH OR WITHOUT PRESENCE OF ULCERS  *URINARY PROBLEMS  *BOWEL PROBLEMS  UNUSUAL RASH Items with * indicate a potential emergency and should be followed up as soon as possible. If you have an emergency after office hours please contact your primary care physician or go to the nearest emergency department.  Please call the clinic during office hours if you have any questions or concerns.   You may also contact the Patient Navigator at [phone] should you have any questions or need assistance in obtaining follow up care.      Resources For Cancer Patients and their Caregivers ? American [employer]: Can assist with transportation, wigs, general needs, runs Look Good Feel Better.        310-765-7485 ? Cancer Care: Provides financial assistance, online support groups, medication/co-pay assistance.   1-800-813-HOPE 503-619-8057) ? [facility] Assists [place] cancer patients and their families through emotional , educational and financial support.  639-817-4249 ? [place] DSS Where to apply for food stamps, Medicaid and utility assistance. (731) 767-2758 ? [facility]: Transportation to medical appointments. 2061528889 ? Social Security Administration: May apply for disability if have a Stage IV cancer. 513-859-4065 ([phone] ? [employer], Disability and Transit Services: Assists with nutrition, care and transit needs. [phone]

## 2018-06-10 NOTE — Assessment & Plan Note (Signed)
1.  Metastatic colon cancer: -Foundation 1 CDX shows MS-stable, K-ras Q 61L,TP53 S215I, NRAS wild type, APC T986f*16, [phone] - Status post FOLFOX for 12 cycles in the adjuvant setting from 03/30/2015 through 09/06/2015. -PET CT scan on 11/19/2017 showing peritoneal carcinomatosis, right paratracheal lymph node with mild FDG uptake with mild nonspecific increased uptake in the right hilar region.  CEA was elevated at 12.9 on 11/16/2017. - 9 cycles of FOLFIRI from 01/09/2018 through 05/08/2018 with addition of bevacizumab during cycle 5.  (After having severe rash with vectibix during cycle 2)  - CT CAP from 05/20/2018 shows minimal improvement in peritoneal metastasis.  Presumed peritoneal implant within the small bowel mesentery measures 1.5 x 1.1 cm.  A pelvic cul-de-sac implant measures 1.9 x 1.7 cm compared to 2.2 x 1.5 cm prior. -Maintenance therapy with 5-FU/leucovorin and bevacizumab started on 05/22/2018. - I have reviewed his labs.  He may proceed with his next cycle today.  We will reevaluate him in 2 weeks. - He has diarrhea which is controlled with 2 tablets of Imodium twice daily.  I have given a refill.  2.  Pulmonary embolism: -This was found to be incidental finding on CT chest done on 05/20/2018. -CT angiogram on 05/20/2018 showed bilateral pulmonary emboli. -He is currently tolerating Xarelto without any major problems.  His breathing is better.  3.  Peripheral neuropathy: -He has pre-existing neuropathy from prior oxaliplatin.  He has numbness in the legs.

## 2018-06-10 NOTE — Progress Notes (Signed)
[address] [place], [place] [address]   CLINIC:  Medical Oncology/Hematology  PCP:  Patient, No Pcp Per No address on file None   REASON FOR VISIT:  Follow-up for  Malignant neoplasm of transverse colon       BRIEF ONCOLOGIC HISTORY:  Oncology History   Stage IIIC ([id number]) adenocarcinoma of transverse colon, diagnosed on colonoscopy by Dr. Oneida Alar on 01/12/2015 followed by definitive surgery by Dr. [name] with right hemicolectomy on 02/12/2015.  He then underwent FOLFOX x 12 cycles in the adjuvant setting (03/30/2015- 09/06/2015).     Malignant neoplasm of transverse colon ([place])   01/12/2015 Pathologic Stage    Colon, biopsy, distal transverse - TUBULOVILLOUS ADENOMA WITH HIGH GRADE DYSPLASIA.    01/12/2015 Procedure    Colonoscopy by Dr. Oneida Alar.    01/12/2015 Tumor Marker    CEA: 6.6 (H)     01/18/2015 Imaging    CT abd/pelvis- Apple-core lesion identified in the mid transverse colon without obstruction. No evidence for lymphadenopathy in the gastrohepatic ligament or omentum.  Stable 8 mm hypo attenuating lesion in the left liver, likely a cyst.    02/10/2015 Initial Diagnosis    Adenocarcinoma of transverse colon ([place])    02/12/2015 Definitive Surgery    [name], Extended right hemicolectomy     02/12/2015 Pathology Results    Mucinous adenocarcinoma with penetration of visceral peritoneum, 4/19 lymph nodes for metastatic disease, negative resection margins, with LVI and perineural invasion    03/30/2015 - 09/06/2015 Chemotherapy    FOLFOX x 12 cycles    05/25/2015 Treatment Plan Change    5 FU bolus discontinued for cycle #5    06/08/2015 Treatment Plan Change    Treatment deferred x 1 week    06/15/2015 Treatment Plan Change    5FU CI decreased by 10% and Oxaliplatin reduced by 15% for cycles #6-#11; Oxaliplatin dropped for cycle #12 d/t neuropathy.     10/20/2015 Imaging    CT CAP- Right hemicolectomy without evidence of  metastatic disease. 2. Previously measured ground-glass lesion in the left upper lobe has resolved. 3. Probable food debris in the stomach, simulating gastric wall thickening. Please correlate clinically. 4. 6 mm irregular nodular density in the left upper lobe, stable. Continued attention on followup exams is warranted.    01/25/2016 Procedure    Colonoscopy by Dr. Oneida Alar- Non-thrombosed external hemorrhoids found on digital rectal exam. - One 4 mm polyp in the rectum, removed with a cold biopsy forceps. Resected and retrieved. - Congested mucosa in the neo-terminal ileum. Biopsied. - Redundant colon. - Internal hemorrhoids.    01/26/2016 Pathology Results    1. Terminal ileum, biopsy - MILD ACUTE (ACTIVE) ILEITIS. - NO DYSPLASIA OR MALIGNANCY IDENTIFIED - SEE COMMENT. 2. Rectum, polyp(s) - HYPERPLASTIC POLYP (X 1). - NO DYSPLASIA OR MALIGNANCY IDENTIFIED.    04/13/2016 Imaging    CT chest- Stable CT chest. 6 mm irregular nodule anterior left upper lobe is stable. The scattered areas of peribronchovascular micro nodularity in the lungs bilaterally are unchanged.    10/19/2016 Imaging    CT CAP: 1. Status post right hemicolectomy. No findings to suggest metastatic disease in the abdomen or pelvis. 2. Aortic atherosclerosis. 3. Additional incidental findings, as above. Aortic Atherosclerosis (ICD10-I70.0).    01/09/2018 -  Chemotherapy    The patient had palonosetron (ALOXI) injection 0.25 mg, 0.25 mg, Intravenous,  Once, 10 of 14 cycles Administration: 0.25 mg (01/09/2018), 0.25 mg (01/23/2018), 0.25 mg (02/11/2018),  0.25 mg (02/25/2018), 0.25 mg (03/12/2018), 0.25 mg (03/27/2018), 0.25 mg (04/10/2018), 0.25 mg (04/24/2018), 0.25 mg (05/08/2018), 0.25 mg (05/22/2018) irinotecan (CAMPTOSAR) 420 mg in dextrose 5 % 500 mL chemo infusion, 180 mg/m2 = 420 mg, Intravenous,  Once, 9 of 9 cycles Administration: 420 mg (01/09/2018), 420 mg (01/23/2018), 420 mg (02/11/2018), 420 mg (02/25/2018), 420 mg  (03/12/2018), 420 mg (03/27/2018), 420 mg (04/10/2018), 420 mg (04/24/2018), 420 mg (05/08/2018) leucovorin 900 mg in dextrose 5 % 250 mL infusion, 944 mg, Intravenous,  Once, 10 of 14 cycles Administration: 900 mg (01/09/2018), 900 mg (01/23/2018), 900 mg (02/11/2018), 900 mg (02/25/2018), 900 mg (03/12/2018), 900 mg (03/27/2018), 900 mg (04/10/2018), 900 mg (04/24/2018), 900 mg (05/08/2018) fluorouracil (ADRUCIL) chemo injection 950 mg, 400 mg/m2 = 950 mg, Intravenous,  Once, 10 of 14 cycles Administration: 950 mg (01/09/2018), 950 mg (01/23/2018), 950 mg (02/11/2018), 950 mg (02/25/2018), 950 mg (03/12/2018), 950 mg (03/27/2018), 950 mg (04/10/2018), 950 mg (04/24/2018), 950 mg (05/08/2018), 950 mg (05/22/2018) fluorouracil (ADRUCIL) 5,650 mg in sodium chloride 0.9 % 137 mL chemo infusion, 2,400 mg/m2 = 5,650 mg, Intravenous, 1 Day/Dose, 10 of 14 cycles Administration: 5,650 mg (01/09/2018), 5,650 mg (01/23/2018), 5,650 mg (02/11/2018), 5,650 mg (02/25/2018), 5,650 mg (03/12/2018), 5,650 mg (03/27/2018), 5,650 mg (04/10/2018), 5,650 mg (04/24/2018), 5,650 mg (05/08/2018), 5,650 mg (05/22/2018)  for chemotherapy treatment.     01/23/2018 Cancer Staging    Staging form: Colon and Rectum, AJCC 7th Edition - Pathologic: M1 - Signed by Zoila Shutter, MD on 01/23/2018     Malignant neoplasm of overlapping sites of colon ([place])   12/26/2017 Initial Diagnosis    Cancer of overlapping sites of colon metastatic to intra-abdominal lymph node ([place])    01/09/2018 -  Chemotherapy    The patient had palonosetron (ALOXI) injection 0.25 mg, 0.25 mg, Intravenous,  Once, 10 of 14 cycles Administration: 0.25 mg (01/09/2018), 0.25 mg (01/23/2018), 0.25 mg (02/11/2018), 0.25 mg (02/25/2018), 0.25 mg (03/12/2018), 0.25 mg (03/27/2018), 0.25 mg (04/10/2018), 0.25 mg (04/24/2018), 0.25 mg (05/08/2018), 0.25 mg (05/22/2018) irinotecan (CAMPTOSAR) 420 mg in dextrose 5 % 500 mL chemo infusion, 180 mg/m2 = 420 mg, Intravenous,  Once, 9 of 9 cycles Administration: 420 mg  (01/09/2018), 420 mg (01/23/2018), 420 mg (02/11/2018), 420 mg (02/25/2018), 420 mg (03/12/2018), 420 mg (03/27/2018), 420 mg (04/10/2018), 420 mg (04/24/2018), 420 mg (05/08/2018) leucovorin 900 mg in dextrose 5 % 250 mL infusion, 944 mg, Intravenous,  Once, 10 of 14 cycles Administration: 900 mg (01/09/2018), 900 mg (01/23/2018), 900 mg (02/11/2018), 900 mg (02/25/2018), 900 mg (03/12/2018), 900 mg (03/27/2018), 900 mg (04/10/2018), 900 mg (04/24/2018), 900 mg (05/08/2018) fluorouracil (ADRUCIL) chemo injection 950 mg, 400 mg/m2 = 950 mg, Intravenous,  Once, 10 of 14 cycles Administration: 950 mg (01/09/2018), 950 mg (01/23/2018), 950 mg (02/11/2018), 950 mg (02/25/2018), 950 mg (03/12/2018), 950 mg (03/27/2018), 950 mg (04/10/2018), 950 mg (04/24/2018), 950 mg (05/08/2018), 950 mg (05/22/2018) fluorouracil (ADRUCIL) 5,650 mg in sodium chloride 0.9 % 137 mL chemo infusion, 2,400 mg/m2 = 5,650 mg, Intravenous, 1 Day/Dose, 10 of 14 cycles Administration: 5,650 mg (01/09/2018), 5,650 mg (01/23/2018), 5,650 mg (02/11/2018), 5,650 mg (02/25/2018), 5,650 mg (03/12/2018), 5,650 mg (03/27/2018), 5,650 mg (04/10/2018), 5,650 mg (04/24/2018), 5,650 mg (05/08/2018), 5,650 mg (05/22/2018)  for chemotherapy treatment.      Colon cancer metastasized to multiple sites [facility])   01/16/2018 Initial Diagnosis    Colon cancer metastasized to multiple sites [facility])    01/23/2018 - 02/10/2018 Chemotherapy    The patient had panitumumab (VECTIBIX)  600 mg in sodium chloride 0.9 % 100 mL chemo infusion, 640 mg, Intravenous,  Once, 1 of 3 cycles Administration: 600 mg (01/23/2018)  for chemotherapy treatment.     03/12/2018 -  Chemotherapy    The patient had bevacizumab (AVASTIN) 500 mg in sodium chloride 0.9 % 100 mL chemo infusion, 525 mg, Intravenous,  Once, 6 of 7 cycles Administration: 500 mg (03/12/2018), 500 mg (03/27/2018), 500 mg (04/10/2018), 500 mg (04/24/2018), 500 mg (05/08/2018), 500 mg (05/22/2018)  for chemotherapy treatment.       CANCER STAGING: Cancer  Staging Malignant neoplasm of transverse colon [facility]) Staging form: Colon and Rectum, AJCC 7th Edition - Pathologic stage from 03/04/2015: Stage IIIC (T4a, N2a, cM0) - Signed by Baird Cancer, PA-C on 03/04/2015 - Pathologic: M1 - Signed by Zoila Shutter, MD on 01/23/2018    INTERVAL HISTORY:  Mr. Worlds 63 y.o. male returns for next cycle of chemotherapy.  He was started on maintenance 5-FU and bevacizumab on 05/22/2018.  Denies any fevers, night sweats or weight loss.  Denies any nausea vomiting.  He has intermittent diarrhea.  Shortness of breath on exertion is stable.  Numbness in the extremities is stable.  Denies any ER visits or hospitalizations.  Energy and appetite levels are 75%.  REVIEW OF SYSTEMS:  Review of Systems  Respiratory: Positive for shortness of breath.   Gastrointestinal: Positive for diarrhea. Negative for nausea.  Neurological: Positive for numbness. Negative for dizziness.  Psychiatric/Behavioral: Positive for sleep disturbance.  All other systems reviewed and are negative.    PAST MEDICAL/SURGICAL HISTORY:  Past Medical History:  Diagnosis Date  . Abnormal stress echocardiogram   . Adenocarcinoma of transverse colon ([place]) 02/10/2015  . Alcohol abuse    Heavy Use up until 2010  . Anxiety   . Blood transfusion without reported diagnosis   . Cancer of overlapping sites of colon metastatic to intra-abdominal lymph node ([place]) 12/26/2017  . COPD (chronic obstructive pulmonary disease) ([place])   . Depression   . Emphysema of lung ([place])   . GERD (gastroesophageal reflux disease)   . Head trauma 2001   closed head injury; coma for 4 weeks  . Hypercholesterolemia   . Hypertension   . ING HERN W/GANGREN RECUR UNILAT/UNSPEC ING HERN 04/21/2009   Qualifier: Diagnosis of  By: Verl Blalock, MD, Delanna Ahmadi PTSD (post-traumatic stress disorder)   . Pulmonary nodule 06/14/2016  . SAH (subarachnoid hemorrhage) ([place]) 08/31/2012  . SDH (subdural hematoma) ([place]) 08/31/2012    Past Surgical History:  Procedure Laterality Date  . BIOPSY  01/25/2016   Procedure: BIOPSY;  Surgeon: Danie Binder, MD;  [facility];  Service: Endoscopy;;  ileum;   . cardiac cath    . COLONOSCOPY  2011   Dr. Oneida Alar: multiple adenomas and hyperplastic polyps  . COLONOSCOPY WITH PROPOFOL N/A 01/12/2015   Procedure: COLONOSCOPY WITH PROPOFOL;  Surgeon: Danie Binder, MD;  [facility];  Service: Endoscopy;  Laterality: N/A;  1030  . COLONOSCOPY WITH PROPOFOL N/A 01/25/2016   Procedure: COLONOSCOPY WITH PROPOFOL;  Surgeon: Danie Binder, MD;  [facility];  Service: Endoscopy;  Laterality: N/A;  1230  . CRANIOTOMY  2001  . ESOPHAGOGASTRODUODENOSCOPY (EGD) WITH PROPOFOL N/A 01/12/2015   Procedure: ESOPHAGOGASTRODUODENOSCOPY (EGD) WITH PROPOFOL;  Surgeon: Danie Binder, MD;  [facility];  Service: Endoscopy;  Laterality: N/A;  . head injury surgery    . HERNIA REPAIR Right 2012  Inguinal- [place] Na  . KIDNEY SURGERY     >30 years ago  . LAPAROSCOPIC RIGHT HEMI COLECTOMY Left 02/10/2015   Procedure: LAPAROSCOPIC THEN OPEN RIGHT HEMI COLECTOMY;  Surgeon: [name], MD;  Location: [facility];  Service: General;  Laterality: Left;  . LAPAROTOMY N/A 12/19/2017   Procedure: EXPLORATORY LAPAROTOMY;  Surgeon: Aviva Signs, MD;  Location: [facility];  Service: General;  Laterality: N/A;  . POLYPECTOMY  01/25/2016   Procedure: POLYPECTOMY;  Surgeon: Danie Binder, MD;  [facility];  Service: Endoscopy;;  colon  . PORT-A-CATH REMOVAL N/A 02/26/2017   Procedure: MINOR REMOVAL PORT-A-CATH;  Surgeon: Aviva Signs, MD;  Location: [facility];  Service: General;  Laterality: N/A;  Pt to arrive at [place] N/A 03/24/2015   Procedure: INSERTION PORT-A-CATH;  Surgeon: Jules Husbands, MD;  Location: [facility];  Service: General;  Laterality: N/A;  . PORTACATH PLACEMENT Left 01/07/2018   Procedure: INSERTION PORT A CATH (ATTACHED  CATHETER IN LEFT SUBCLAVIAN);  Surgeon: Aviva Signs, MD;  Location: [facility];  Service: General;  Laterality: Left;     SOCIAL HISTORY:  Social History   Socioeconomic History  . Marital status: Divorced    Spouse name: Not on file  . Number of children: 2  . Years of education: 70  . Highest education level: Not on file  Occupational History  . Occupation: disabled/unemployed  . Occupation: unemployed/Mediicaid only    Comment: NO INCOME  Social Needs  . Financial resource strain: Very hard  . Food insecurity:    Worry: Often true    Inability: Often true  . Transportation needs:    Medical: Yes    Non-medical: No  Tobacco Use  . Smoking status: Former Smoker    Packs/day: 0.10    Years: 30.00    Pack years: 3.00    Types: Cigarettes    Start date: 02/06/1973    Last attempt to quit: 12/09/2017    Years since quitting: 0.5  . Smokeless tobacco: Never Used  . Tobacco comment: Quit on 12/09/2017  Substance and Sexual Activity  . Alcohol use: Yes    Alcohol/week: 0.0 standard drinks    Comment: beer occ, history of ETOH abuse in remote past.   . Drug use: No  . Sexual activity: Never    Partners: Female    Birth control/protection: None  Lifestyle  . Physical activity:    Days per week: Not on file    Minutes per session: Not on file  . Stress: Not on file  Relationships  . Social connections:    Talks on phone: Not on file    Gets together: Not on file    Attends religious service: Not on file    Active member of club or organization: Not on file    Attends meetings of clubs or organizations: Not on file    Relationship status: Not on file  . Intimate partner violence:    Fear of current or ex partner: Not on file    Emotionally abused: Not on file    Physically abused: Not on file    Forced sexual activity: Not on file  Other Topics Concern  . Not on file  Social History Narrative   Single   Lives alone   Turned down for disability    FAMILY HISTORY:   Family History  Problem Relation Age of Onset  . Pulmonary embolism Mother   . Breast cancer Mother   .  Cancer Mother        breast cancer  . Arthritis Mother   . Heart disease Father   . Cancer Father 60       Leukemia  . Cancer Paternal Grandfather        Lung  . Ataxia Neg Hx   . Chorea Neg Hx   . Dementia Neg Hx   . Mental retardation Neg Hx   . Migraines Neg Hx   . Multiple sclerosis Neg Hx   . Neurofibromatosis Neg Hx   . Neuropathy Neg Hx   . Parkinsonism Neg Hx   . Seizures Neg Hx   . Stroke Neg Hx   . Colon cancer Neg Hx     CURRENT MEDICATIONS:  Outpatient Encounter Medications as of 06/10/2018  Medication Sig Note  . amitriptyline (ELAVIL) 25 MG tablet Take 1 tablet (25 mg total) by mouth at bedtime.   Marland Kitchen buPROPion (WELLBUTRIN SR) 150 MG 12 hr tablet Take 150 mg by mouth every morning.    . busPIRone (BUSPAR) 10 MG tablet Take 10 mg by mouth 2 (two) times daily.   . fluorouracil CALGB 65537 in sodium chloride 0.9 % 150 mL Inject 5,650 mg into the vein. Over 46 hours   . FLUoxetine (PROZAC) 20 MG capsule TAKE 1 CAPSULE BY MOUTH 2 TIMES A DAY (Patient taking differently: Take 20 mg by mouth 2 (two) times daily. ) 06/10/2018: 1 tab TID  . gabapentin (NEURONTIN) 300 MG capsule TAKE 1 CAPSULE BY MOUTH 3 TIMES A DAY   . IRINOTECAN HCL IV Inject 420 mg into the vein every 14 (fourteen) days.   Marland Kitchen LEUCOVORIN CALCIUM IV Inject 944 mg into the vein every 14 (fourteen) days.   Marland Kitchen lidocaine (XYLOCAINE) 2 % solution Use as directed 15 mLs in the mouth or throat every 4 (four) hours as needed for mouth pain (as an aid to eat and drink).   . lidocaine-prilocaine (EMLA) cream APPLY A SMALL AMOUNT OVER PORT SITE AND COVER WITH PLASTIC WRAP ONE HOUR PRIOR TO APPOINTMENT   . loperamide (IMODIUM) 2 MG capsule 2 tablets twice daily   . omeprazole (PRILOSEC) 20 MG capsule Take 20 mg by mouth every morning.    . ondansetron (ZOFRAN) 8 MG tablet Take 1 tablet (8 mg total) by mouth 2 (two) times  daily as needed for refractory nausea / vomiting. Start on day 3 after chemotherapy.   Marland Kitchen PROAIR HFA 108 (90 Base) MCG/ACT inhaler INHALE 2 PUFFS BY MOUTH EVERY 6 HOURS AS NEEDED FOR SHORTNESS OF BREATH/WHEEZING. (Patient taking differently: Inhale 2 puffs into the lungs every 6 (six) hours as needed for wheezing or shortness of breath. )   . prochlorperazine (COMPAZINE) 10 MG tablet Take 1 tablet (10 mg total) by mouth every 6 (six) hours as needed (NAUSEA).   . Rivaroxaban 15 & 20 MG TBPK Take 1 tablet twice daily for 21 days. Then switch to 20 mg tablet and take 1 tablet daily.   Marland Kitchen triamcinolone ointment (KENALOG) 0.5 % Apply 1 application topically 2 (two) times daily.   . [DISCONTINUED] loperamide (IMODIUM) 2 MG capsule TAKE 1-2 CAPSULES BY MOUTH EVERY 6 HOURS AS NEEDED FOR DIARRHEA OR LOOSE STOOLS   . atorvastatin (LIPITOR) 20 MG tablet TAKE 1 TABLET BY MOUTH AT BEDTIME (Patient not taking: Reported on 06/10/2018)    No facility-administered encounter medications on file as of 06/10/2018.     ALLERGIES:  No Known Allergies   PHYSICAL EXAM:  ECOG Performance  status: 1  Vitals:   06/10/18 0756  BP: 119/70  Pulse: (!) 120  Resp: 20  Temp: 98.4 F (36.9 C)  SpO2: 94%   Filed Weights   06/10/18 0756  Weight: 232 lb 3.2 oz (105.3 kg)    Physical Exam Vitals signs reviewed.  Constitutional:      Appearance: Normal appearance.  Cardiovascular:     Rate and Rhythm: Normal rate and regular rhythm.     Heart sounds: Normal heart sounds.  Pulmonary:     Effort: Pulmonary effort is normal.     Breath sounds: Normal breath sounds.  Abdominal:     General: There is no distension.     Palpations: Abdomen is soft. There is no mass.  Musculoskeletal:        General: No swelling.  Skin:    General: Skin is warm.  Neurological:     General: No focal deficit present.     Mental Status: He is alert and oriented to person, place, and time.  Psychiatric:        Mood and Affect: Mood  normal.        Behavior: Behavior normal.      LABORATORY DATA:  I have reviewed the labs as listed.  CBC    Component Value Date/Time   WBC 4.4 06/10/2018 0746   RBC 4.32 06/10/2018 0746   HGB 14.4 06/10/2018 0746   HCT 44.2 06/10/2018 0746   PLT 216 06/10/2018 0746   MCV 102.3 (H) 06/10/2018 0746   MCH 33.3 06/10/2018 0746   MCHC 32.6 06/10/2018 0746   RDW 14.6 06/10/2018 0746   LYMPHSABS 1.0 06/10/2018 0746   MONOABS 1.1 (H) 06/10/2018 0746   EOSABS 0.0 06/10/2018 0746   BASOSABS 0.1 06/10/2018 0746   CMP Latest Ref Rng & Units 06/10/2018 05/22/2018 05/08/2018  Glucose 70 - 99 mg/dL 163(H) 153(H) 134(H)  BUN 6 - 20 mg/dL 5(L) 7 13  Creatinine 0.61 - 1.24 mg/dL 0.96 1.03 0.90  Sodium 135 - 145 mmol/L 135 135 138  Potassium 3.5 - 5.1 mmol/L 4.3 3.4(L) 3.6  Chloride 98 - 111 mmol/L 100 97(L) 105  CO2 22 - 32 mmol/L '24 25 22  ' Calcium 8.9 - 10.3 mg/dL 8.8(L) 9.0 8.6(L)  Total Protein 6.5 - 8.1 g/dL 7.0 6.9 6.6  Total Bilirubin 0.3 - 1.2 mg/dL 0.3 0.7 0.3  Alkaline Phos 38 - 126 U/L 91 106 65  AST 15 - 41 U/L '19 25 15  ' ALT 0 - 44 U/L '12 14 14       ' DIAGNOSTIC IMAGING:  I have independently reviewed the scans and discussed with the patient.   I have reviewed Venita Lick LPN's note and agree with the documentation.  I personally performed a face-to-face visit, made revisions and my assessment and plan is as follows.    ASSESSMENT & PLAN:   Colon cancer metastasized to multiple sites ([place]) 1.  Metastatic colon cancer: -Foundation 1 CDX shows MS-stable, K-ras Q 61L,TP53 S215I, NRAS wild type, APC T945f*16, [id number] [phone] - Status post FOLFOX for 12 cycles in the adjuvant setting from 03/30/2015 through 09/06/2015. -PET CT scan on 11/19/2017 showing peritoneal carcinomatosis, right paratracheal lymph node with mild FDG uptake with mild nonspecific increased uptake in the right hilar region.  CEA was elevated at 12.9 on 11/16/2017. - 9 cycles of FOLFIRI from 01/09/2018  through 05/08/2018 with addition of bevacizumab during cycle 5.  (After having severe rash with vectibix during cycle 2)  - CT  CAP from 05/20/2018 shows minimal improvement in peritoneal metastasis.  Presumed peritoneal implant within the small bowel mesentery measures 1.5 x 1.1 cm.  A pelvic cul-de-sac implant measures 1.9 x 1.7 cm compared to 2.2 x 1.5 cm prior. -Maintenance therapy with 5-FU/leucovorin and bevacizumab started on 05/22/2018. - I have reviewed his labs.  He may proceed with his next cycle today.  We will reevaluate him in 2 weeks. - He has diarrhea which is controlled with 2 tablets of Imodium twice daily.  I have given a refill.  2.  Pulmonary embolism: -This was found to be incidental finding on CT chest done on 05/20/2018. -CT angiogram on 05/20/2018 showed bilateral pulmonary emboli. -He is currently tolerating Xarelto without any major problems.  His breathing is better.  3.  Peripheral neuropathy: -He has pre-existing neuropathy from prior oxaliplatin.  He has numbness in the legs.    Total time spent is 25 minutes with more than 50% of the time spent face-to-face discussing and reinforcing treatment plan, side effects and coordination of care.  Orders placed this encounter:  Orders Placed This Encounter  Procedures  . Comprehensive metabolic panel  . CBC with Differential  . CEA  . Magnesium      Derek Jack, MD [place] [phone]

## 2018-06-10 NOTE — Addendum Note (Signed)
Addended by: Derek Jack on: 06/10/2018 08:35 AM   Modules accepted: Orders

## 2018-06-10 NOTE — Progress Notes (Signed)
0820 Lab and urine results and vital signs reviewed with and pt seen by Dr. Delton Coombes today and pt approved for chemo tx per MD                                          Erik [name] tolerated chemo tx well without complaints or incident. Pt discharged with 5FU pump infusing without issues. VSS upon discharge. Pt discharged self ambulatory using his cane in satisfactory condition

## 2018-06-12 ENCOUNTER — Inpatient Hospital Stay (HOSPITAL_COMMUNITY): Payer: Medicaid Other

## 2018-06-12 ENCOUNTER — Other Ambulatory Visit: Payer: Self-pay

## 2018-06-12 ENCOUNTER — Encounter (HOSPITAL_COMMUNITY): Payer: Self-pay

## 2018-06-12 VITALS — BP 128/71 | HR 104 | Temp 98.0°F | Resp 18

## 2018-06-12 DIAGNOSIS — E538 Deficiency of other specified B group vitamins: Secondary | ICD-10-CM

## 2018-06-12 DIAGNOSIS — C188 Malignant neoplasm of overlapping sites of colon: Secondary | ICD-10-CM

## 2018-06-12 DIAGNOSIS — Z5111 Encounter for antineoplastic chemotherapy: Secondary | ICD-10-CM | POA: Diagnosis not present

## 2018-06-12 DIAGNOSIS — C184 Malignant neoplasm of transverse colon: Secondary | ICD-10-CM

## 2018-06-12 MED ORDER — HEPARIN SOD (PORK) LOCK FLUSH 100 UNIT/ML IV SOLN
500.0000 [IU] | Freq: Once | INTRAVENOUS | Status: AC | PRN
  Administered 2018-06-12: 13:00:00 500 [IU]

## 2018-06-12 MED ORDER — SODIUM CHLORIDE 0.9% FLUSH
10.0000 mL | INTRAVENOUS | Status: DC | PRN
  Administered 2018-06-12: 10 mL
  Filled 2018-06-12: qty 10

## 2018-06-12 MED ORDER — CYANOCOBALAMIN 1000 MCG/ML IJ SOLN: 1000.0000 ug | Freq: Once | INTRAMUSCULAR | Status: DC

## 2018-06-12 MED ORDER — CYANOCOBALAMIN 1000 MCG/ML IJ SOLN
INTRAMUSCULAR | Status: AC
  Filled 2018-06-12: qty 1

## 2018-06-12 NOTE — Progress Notes (Signed)
Erik [name] tolerated 5FU pump well without complaints or incident. 5FU pump discontinued with portacath flushed with 10 ml NS and 5 ml Heparin easily per protocol then de-accessed. VSS Pt discharged self ambulatory using his cane in satisfactory condition

## 2018-06-12 NOTE — Patient Instructions (Signed)
[place] at [facility] Discharge Instructions  5FU pump discontinued with portacath flushed per protocol. Follow-up as scheduled. Call clinic for any questions or concerns   Thank you for choosing [place] at [facility] to provide your oncology and hematology care.  To afford each patient quality time with our provider, please arrive at least 15 minutes before your scheduled appointment time.   If you have a lab appointment with the [facility] please come in thru the  Main Entrance and check in at the main information desk  You need to re-schedule your appointment should you arrive 10 or more minutes late.  We strive to give you quality time with our providers, and arriving late affects you and other patients whose appointments are after yours.  Also, if you no show three or more times for appointments you may be dismissed from the clinic at the providers discretion.     Again, thank you for choosing [facility].  Our hope is that these requests will decrease the amount of time that you wait before being seen by our physicians.       _____________________________________________________________  Should you have questions after your visit to [facility], please contact our office at [phone] between the hours of 8:00 a.m. and 4:30 p.m.  Voicemails left after 4:00 p.m. will not be returned until the following business day.  For prescription refill requests, have your pharmacy contact our office and allow 72 hours.    Cancer Center Support Programs:   > Cancer Support Group  2nd Tuesday of the month 1pm-2pm, Journey Room

## 2018-06-26 ENCOUNTER — Inpatient Hospital Stay (HOSPITAL_COMMUNITY): Payer: Medicaid Other

## 2018-06-26 ENCOUNTER — Other Ambulatory Visit: Payer: Self-pay

## 2018-06-26 ENCOUNTER — Encounter (HOSPITAL_COMMUNITY): Payer: Self-pay | Admitting: Hematology

## 2018-06-26 ENCOUNTER — Inpatient Hospital Stay (HOSPITAL_BASED_OUTPATIENT_CLINIC_OR_DEPARTMENT_OTHER): Payer: Medicaid Other | Admitting: Hematology

## 2018-06-26 VITALS — BP 123/73 | HR 100 | Temp 98.0°F | Resp 18

## 2018-06-26 VITALS — BP 115/78 | HR 106 | Temp 98.2°F | Resp 18 | Wt 228.0 lb

## 2018-06-26 DIAGNOSIS — C786 Secondary malignant neoplasm of retroperitoneum and peritoneum: Secondary | ICD-10-CM | POA: Diagnosis not present

## 2018-06-26 DIAGNOSIS — Z9049 Acquired absence of other specified parts of digestive tract: Secondary | ICD-10-CM

## 2018-06-26 DIAGNOSIS — Z5111 Encounter for antineoplastic chemotherapy: Secondary | ICD-10-CM | POA: Diagnosis not present

## 2018-06-26 DIAGNOSIS — C772 Secondary and unspecified malignant neoplasm of intra-abdominal lymph nodes: Secondary | ICD-10-CM | POA: Diagnosis not present

## 2018-06-26 DIAGNOSIS — C184 Malignant neoplasm of transverse colon: Secondary | ICD-10-CM

## 2018-06-26 DIAGNOSIS — Z87891 Personal history of nicotine dependence: Secondary | ICD-10-CM

## 2018-06-26 DIAGNOSIS — C188 Malignant neoplasm of overlapping sites of colon: Secondary | ICD-10-CM

## 2018-06-26 DIAGNOSIS — I2699 Other pulmonary embolism without acute cor pulmonale: Secondary | ICD-10-CM | POA: Diagnosis not present

## 2018-06-26 DIAGNOSIS — C189 Malignant neoplasm of colon, unspecified: Secondary | ICD-10-CM

## 2018-06-26 LAB — CBC WITH DIFFERENTIAL/PLATELET
Abs Immature Granulocytes: 0.01 10*3/uL (ref 0.00–0.07)
Basophils Absolute: 0 10*3/uL (ref 0.0–0.1)
Basophils Relative: 1 %
Eosinophils Absolute: 0.1 10*3/uL (ref 0.0–0.5)
Eosinophils Relative: 2 %
HCT: 47.2 % (ref 39.0–52.0)
Hemoglobin: 15.3 g/dL (ref 13.0–17.0)
Immature Granulocytes: 0 %
Lymphocytes Relative: 25 %
Lymphs Abs: 0.9 10*3/uL (ref 0.7–4.0)
MCH: 32.5 pg (ref 26.0–34.0)
MCHC: 32.4 g/dL (ref 30.0–36.0)
MCV: 100.2 fL — ABNORMAL HIGH (ref 80.0–100.0)
Monocytes Absolute: 0.9 10*3/uL (ref 0.1–1.0)
Monocytes Relative: 24 %
Neutro Abs: 1.7 10*3/uL (ref 1.7–7.7)
Neutrophils Relative %: 48 %
Platelets: 184 10*3/uL (ref 150–400)
RBC: 4.71 MIL/uL (ref 4.22–5.81)
RDW: 14.4 % (ref 11.5–15.5)
WBC: 3.6 10*3/uL — ABNORMAL LOW (ref 4.0–10.5)
nRBC: 0 % (ref 0.0–0.2)

## 2018-06-26 LAB — COMPREHENSIVE METABOLIC PANEL
ALT: 12 U/L (ref 0–44)
AST: 14 U/L — ABNORMAL LOW (ref 15–41)
Albumin: 3.5 g/dL (ref 3.5–5.0)
Alkaline Phosphatase: 90 U/L (ref 38–126)
Anion gap: 10 (ref 5–15)
BUN: 9 mg/dL (ref 6–20)
CO2: 26 mmol/L (ref 22–32)
Calcium: 8.9 mg/dL (ref 8.9–10.3)
Chloride: 100 mmol/L (ref 98–111)
Creatinine, Ser: 0.84 mg/dL (ref 0.61–1.24)
GFR calc Af Amer: >60 mL/min (ref >60)
GFR calc non Af Amer: >60 mL/min (ref >60)
Glucose, Bld: 122 mg/dL — ABNORMAL HIGH (ref 70–99)
Potassium: 3.3 mmol/L — ABNORMAL LOW (ref 3.5–5.1)
Sodium: 136 mmol/L (ref 135–145)
Total Bilirubin: 0.8 mg/dL (ref 0.3–1.2)
Total Protein: 6.7 g/dL (ref 6.5–8.1)

## 2018-06-26 LAB — MAGNESIUM: Magnesium: 1.9 mg/dL (ref 1.7–2.4)

## 2018-06-26 MED ORDER — SODIUM CHLORIDE 0.9 % IV SOLN: Freq: Once | INTRAVENOUS | Status: DC

## 2018-06-26 MED ORDER — RIVAROXABAN 20 MG PO TABS: 20.0000 mg | ORAL_TABLET | Freq: Every day | ORAL | 0 refills | Status: DC

## 2018-06-26 MED ORDER — SODIUM CHLORIDE 0.9 % IV SOLN
2400.0000 mg/m2 | INTRAVENOUS | Status: DC
  Administered 2018-06-26: 13:00:00 5650 mg via INTRAVENOUS
  Filled 2018-06-26: qty 113

## 2018-06-26 MED ORDER — SODIUM CHLORIDE 0.9 % IV SOLN
4.7000 mg/kg | Freq: Once | INTRAVENOUS | Status: AC
  Administered 2018-06-26: 500 mg via INTRAVENOUS
  Filled 2018-06-26: qty 16

## 2018-06-26 MED ORDER — SODIUM CHLORIDE 0.9 % IV SOLN
Freq: Once | INTRAVENOUS | Status: AC
  Administered 2018-06-26: 10:00:00 via INTRAVENOUS

## 2018-06-26 MED ORDER — RIVAROXABAN 20 MG PO TABS: 20.0000 mg | ORAL_TABLET | Freq: Every day | ORAL | 6 refills | Status: DC

## 2018-06-26 MED ORDER — SODIUM CHLORIDE 0.9 % IV SOLN
10.0000 mg | Freq: Once | INTRAVENOUS | Status: AC
  Administered 2018-06-26: 10 mg via INTRAVENOUS
  Filled 2018-06-26: qty 10

## 2018-06-26 MED ORDER — SODIUM CHLORIDE 0.9% FLUSH
10.0000 mL | INTRAVENOUS | Status: DC | PRN
  Administered 2018-06-26: 10 mL
  Filled 2018-06-26: qty 10

## 2018-06-26 MED ORDER — PALONOSETRON HCL INJECTION 0.25 MG/5ML
0.2500 mg | Freq: Once | INTRAVENOUS | Status: AC
  Administered 2018-06-26: 0.25 mg via INTRAVENOUS
  Filled 2018-06-26: qty 5

## 2018-06-26 MED ORDER — ATROPINE SULFATE 1 MG/ML IJ SOLN
0.5000 mg | Freq: Once | INTRAMUSCULAR | Status: AC
  Administered 2018-06-26: 11:00:00 0.5 mg via INTRAVENOUS
  Filled 2018-06-26: qty 1

## 2018-06-26 MED ORDER — FLUOROURACIL CHEMO INJECTION 2.5 GM/50ML
400.0000 mg/m2 | Freq: Once | INTRAVENOUS | Status: AC
  Administered 2018-06-26: 13:00:00 950 mg via INTRAVENOUS
  Filled 2018-06-26: qty 19

## 2018-06-26 MED ORDER — ENOXAPARIN SODIUM 150 MG/ML ~~LOC~~ SOLN
150.0000 mg | Freq: Once | SUBCUTANEOUS | Status: AC
  Administered 2018-06-26: 150 mg via SUBCUTANEOUS
  Filled 2018-06-26: qty 1

## 2018-06-26 MED ORDER — LEUCOVORIN CALCIUM INJECTION 350 MG
900.0000 mg | Freq: Once | INTRAVENOUS | Status: AC
  Administered 2018-06-26: 12:00:00 900 mg via INTRAVENOUS
  Filled 2018-06-26: qty 45

## 2018-06-26 NOTE — Patient Instructions (Addendum)
[place] at [facility] Discharge Instructions  You were seen today by Dr. Delton Coombes. He went over your recent lab results. He will see you back in 2 weeks for labs, treatment and follow up.  Continue taking the Xarelto daily  Thank you for choosing [place] at [facility] to provide your oncology and hematology care.  To afford each patient quality time with our provider, please arrive at least 15 minutes before your scheduled appointment time.   If you have a lab appointment with the [place] please come in thru the  Main Entrance and check in at the main information desk  You need to re-schedule your appointment should you arrive 10 or more minutes late.  We strive to give you quality time with our providers, and arriving late affects you and other patients whose appointments are after yours.  Also, if you no show three or more times for appointments you may be dismissed from the clinic at the providers discretion.     Again, thank you for choosing [facility].  Our hope is that these requests will decrease the amount of time that you wait before being seen by our physicians.       _____________________________________________________________  Should you have questions after your visit to [facility], please contact our office at [phone] between the hours of 8:00 a.m. and 4:30 p.m.  Voicemails left after 4:00 p.m. will not be returned until the following business day.  For prescription refill requests, have your pharmacy contact our office and allow 72 hours.    Cancer Center Support Programs:   > Cancer Support Group  2nd Tuesday of the month 1pm-2pm, Journey Room

## 2018-06-26 NOTE — Progress Notes (Signed)
06/26/18  Received request to enter order for Lovenox 1.5 mg/kg today x 1 dose due to delay in Xarelto medication at patients home.  V.O. Dr Beckey Downing LPN/Tacari Repass Ronnald Ramp, PharmD

## 2018-06-26 NOTE — Assessment & Plan Note (Addendum)
1.  Metastatic colon cancer: -Foundation 1 CDX shows MS-stable, K-ras Q 61L,TP53 S215I, NRAS wild type, APC T927f*16, FAM123B [phone] - Status post FOLFOX for 12 cycles in the adjuvant setting from 03/30/2015 through 09/06/2015. -PET CT scan on 11/19/2017 showing peritoneal carcinomatosis, right paratracheal lymph node with mild FDG uptake with mild nonspecific increased uptake in the right hilar region.  CEA was elevated at 12.9 on 11/16/2017. - 9 cycles of FOLFIRI from 01/09/2018 through 05/08/2018 with addition of bevacizumab during cycle 5.  (After having severe rash with vectibix during cycle 2)  - CT CAP from 05/20/2018 shows minimal improvement in peritoneal metastasis.  Presumed peritoneal implant within the small bowel mesentery measures 1.5 x 1.1 cm.  A pelvic cul-de-sac implant measures 1.9 x 1.7 cm compared to 2.2 x 1.5 cm prior. -Maintenance therapy with 5-FU/leucovorin and bevacizumab started on 05/22/2018. - I have reviewed his labs.  He may proceed with his next cycle today.  We will reevaluate him in 2 weeks.  2.  Pulmonary embolism: -This was found to be incidental finding on CT chest done on 05/20/2018. -CT angiogram on 05/20/2018 showed bilateral pulmonary emboli. -He was started on Xarelto. Once he finished starter pack, he did not obtain refill. He has been off drug for 4 days. We will administer Lovenox today and restart him on Xarelto 20 mg daily.   3.  Peripheral neuropathy: -He has pre-existing neuropathy from prior oxaliplatin.  He has numbness in the legs.

## 2018-06-26 NOTE — Progress Notes (Signed)
Patient to treatment area for chemotherapy.  Patient stated he still has prn diarrhea that is controlled by his home medications.  Next shipment is Thursday from his pharmacy.  No s/s of distress noted.   Patient seen by Dr. Delton Coombes with lab review and heart rate above 100 with verbal order ok to treat today Dr. Delton Coombes.  Pharmacy notified. Patient also to receive lovenox shot verbal order Dr. Delton Coombes.   Patient tolerated chemotherapy with no complaints voiced.  Port site clean and dry with no bruising or swelling noted at site.  Good blood return noted before and after administration of chemotherapy.  Chemotherapy pump connected with no alarms noted.    Patient left ambulatory with VSS and no s/s of distress noted.

## 2018-06-26 NOTE — Progress Notes (Signed)
[address], [place] [address]   CLINIC:  Medical Oncology/Hematology  PCP:  Patient, No Pcp Per No address on file None   REASON FOR VISIT:  Follow-up for Malignant neoplasm of transverse colon   BRIEF ONCOLOGIC HISTORY:  Oncology History   Stage IIIC ([id number]) adenocarcinoma of transverse colon, diagnosed on colonoscopy by Dr. Oneida Alar on 01/12/2015 followed by definitive surgery by Dr. [name] with right hemicolectomy on 02/12/2015.  He then underwent FOLFOX x 12 cycles in the adjuvant setting (03/30/2015- 09/06/2015).     Malignant neoplasm of transverse colon ([place])   01/12/2015 Pathologic Stage    Colon, biopsy, distal transverse - TUBULOVILLOUS ADENOMA WITH HIGH GRADE DYSPLASIA.    01/12/2015 Procedure    Colonoscopy by Dr. Oneida Alar.    01/12/2015 Tumor Marker    CEA: 6.6 (H)     01/18/2015 Imaging    CT abd/pelvis- Apple-core lesion identified in the mid transverse colon without obstruction. No evidence for lymphadenopathy in the gastrohepatic ligament or omentum.  Stable 8 mm hypo attenuating lesion in the left liver, likely a cyst.    02/10/2015 Initial Diagnosis    Adenocarcinoma of transverse colon ([place])    02/12/2015 Definitive Surgery    [name], Extended right hemicolectomy     02/12/2015 Pathology Results    Mucinous adenocarcinoma with penetration of visceral peritoneum, 4/19 lymph nodes for metastatic disease, negative resection margins, with LVI and perineural invasion    03/30/2015 - 09/06/2015 Chemotherapy    FOLFOX x 12 cycles    05/25/2015 Treatment Plan Change    5 FU bolus discontinued for cycle #5    06/08/2015 Treatment Plan Change    Treatment deferred x 1 week    06/15/2015 Treatment Plan Change    5FU CI decreased by 10% and Oxaliplatin reduced by 15% for cycles #6-#11; Oxaliplatin dropped for cycle #12 d/t neuropathy.     10/20/2015 Imaging    CT CAP- Right hemicolectomy without evidence of  metastatic disease. 2. Previously measured ground-glass lesion in the left upper lobe has resolved. 3. Probable food debris in the stomach, simulating gastric wall thickening. Please correlate clinically. 4. 6 mm irregular nodular density in the left upper lobe, stable. Continued attention on followup exams is warranted.    01/25/2016 Procedure    Colonoscopy by Dr. Oneida Alar- Non-thrombosed external hemorrhoids found on digital rectal exam. - One 4 mm polyp in the rectum, removed with a cold biopsy forceps. Resected and retrieved. - Congested mucosa in the neo-terminal ileum. Biopsied. - Redundant colon. - Internal hemorrhoids.    01/26/2016 Pathology Results    1. Terminal ileum, biopsy - MILD ACUTE (ACTIVE) ILEITIS. - NO DYSPLASIA OR MALIGNANCY IDENTIFIED - SEE COMMENT. 2. Rectum, polyp(s) - HYPERPLASTIC POLYP (X 1). - NO DYSPLASIA OR MALIGNANCY IDENTIFIED.    04/13/2016 Imaging    CT chest- Stable CT chest. 6 mm irregular nodule anterior left upper lobe is stable. The scattered areas of peribronchovascular micro nodularity in the lungs bilaterally are unchanged.    10/19/2016 Imaging    CT CAP: 1. Status post right hemicolectomy. No findings to suggest metastatic disease in the abdomen or pelvis. 2. Aortic atherosclerosis. 3. Additional incidental findings, as above. Aortic Atherosclerosis (ICD10-I70.0).    01/09/2018 -  Chemotherapy    The patient had palonosetron (ALOXI) injection 0.25 mg, 0.25 mg, Intravenous,  Once, 12 of 14 cycles Administration: 0.25 mg (01/09/2018), 0.25 mg (01/23/2018), 0.25 mg (02/11/2018), 0.25 mg (02/25/2018), 0.25 mg (  03/12/2018), 0.25 mg (03/27/2018), 0.25 mg (04/10/2018), 0.25 mg (04/24/2018), 0.25 mg (05/08/2018), 0.25 mg (05/22/2018), 0.25 mg (06/10/2018), 0.25 mg (06/26/2018) irinotecan (CAMPTOSAR) 420 mg in dextrose 5 % 500 mL chemo infusion, 180 mg/m2 = 420 mg, Intravenous,  Once, 9 of 9 cycles Administration: 420 mg (01/09/2018), 420 mg (01/23/2018), 420 mg  (02/11/2018), 420 mg (02/25/2018), 420 mg (03/12/2018), 420 mg (03/27/2018), 420 mg (04/10/2018), 420 mg (04/24/2018), 420 mg (05/08/2018) leucovorin 900 mg in dextrose 5 % 250 mL infusion, 944 mg, Intravenous,  Once, 12 of 14 cycles Administration: 900 mg (01/09/2018), 900 mg (01/23/2018), 900 mg (02/11/2018), 900 mg (02/25/2018), 900 mg (03/12/2018), 900 mg (03/27/2018), 900 mg (04/10/2018), 900 mg (04/24/2018), 900 mg (05/08/2018), 900 mg (06/10/2018), 900 mg (06/26/2018) fluorouracil (ADRUCIL) chemo injection 950 mg, 400 mg/m2 = 950 mg, Intravenous,  Once, 12 of 14 cycles Administration: 950 mg (01/09/2018), 950 mg (01/23/2018), 950 mg (02/11/2018), 950 mg (02/25/2018), 950 mg (03/12/2018), 950 mg (03/27/2018), 950 mg (04/10/2018), 950 mg (04/24/2018), 950 mg (05/08/2018), 950 mg (05/22/2018), 950 mg (06/10/2018), 950 mg (06/26/2018) fluorouracil (ADRUCIL) 5,650 mg in sodium chloride 0.9 % 137 mL chemo infusion, 2,400 mg/m2 = 5,650 mg, Intravenous, 1 Day/Dose, 12 of 14 cycles Administration: 5,650 mg (01/09/2018), 5,650 mg (01/23/2018), 5,650 mg (02/11/2018), 5,650 mg (02/25/2018), 5,650 mg (03/12/2018), 5,650 mg (03/27/2018), 5,650 mg (04/10/2018), 5,650 mg (04/24/2018), 5,650 mg (05/08/2018), 5,650 mg (05/22/2018), 5,650 mg (06/10/2018), 5,650 mg (06/26/2018)  for chemotherapy treatment.     01/23/2018 Cancer Staging    Staging form: Colon and Rectum, AJCC 7th Edition - Pathologic: M1 - Signed by Zoila Shutter, MD on 01/23/2018     Malignant neoplasm of overlapping sites of colon ([place])   12/26/2017 Initial Diagnosis    Cancer of overlapping sites of colon metastatic to intra-abdominal lymph node ([place])    01/09/2018 -  Chemotherapy    The patient had palonosetron (ALOXI) injection 0.25 mg, 0.25 mg, Intravenous,  Once, 12 of 14 cycles Administration: 0.25 mg (01/09/2018), 0.25 mg (01/23/2018), 0.25 mg (02/11/2018), 0.25 mg (02/25/2018), 0.25 mg (03/12/2018), 0.25 mg (03/27/2018), 0.25 mg (04/10/2018), 0.25 mg (04/24/2018), 0.25 mg (05/08/2018), 0.25 mg  (05/22/2018), 0.25 mg (06/10/2018), 0.25 mg (06/26/2018) irinotecan (CAMPTOSAR) 420 mg in dextrose 5 % 500 mL chemo infusion, 180 mg/m2 = 420 mg, Intravenous,  Once, 9 of 9 cycles Administration: 420 mg (01/09/2018), 420 mg (01/23/2018), 420 mg (02/11/2018), 420 mg (02/25/2018), 420 mg (03/12/2018), 420 mg (03/27/2018), 420 mg (04/10/2018), 420 mg (04/24/2018), 420 mg (05/08/2018) leucovorin 900 mg in dextrose 5 % 250 mL infusion, 944 mg, Intravenous,  Once, 12 of 14 cycles Administration: 900 mg (01/09/2018), 900 mg (01/23/2018), 900 mg (02/11/2018), 900 mg (02/25/2018), 900 mg (03/12/2018), 900 mg (03/27/2018), 900 mg (04/10/2018), 900 mg (04/24/2018), 900 mg (05/08/2018), 900 mg (06/10/2018), 900 mg (06/26/2018) fluorouracil (ADRUCIL) chemo injection 950 mg, 400 mg/m2 = 950 mg, Intravenous,  Once, 12 of 14 cycles Administration: 950 mg (01/09/2018), 950 mg (01/23/2018), 950 mg (02/11/2018), 950 mg (02/25/2018), 950 mg (03/12/2018), 950 mg (03/27/2018), 950 mg (04/10/2018), 950 mg (04/24/2018), 950 mg (05/08/2018), 950 mg (05/22/2018), 950 mg (06/10/2018), 950 mg (06/26/2018) fluorouracil (ADRUCIL) 5,650 mg in sodium chloride 0.9 % 137 mL chemo infusion, 2,400 mg/m2 = 5,650 mg, Intravenous, 1 Day/Dose, 12 of 14 cycles Administration: 5,650 mg (01/09/2018), 5,650 mg (01/23/2018), 5,650 mg (02/11/2018), 5,650 mg (02/25/2018), 5,650 mg (03/12/2018), 5,650 mg (03/27/2018), 5,650 mg (04/10/2018), 5,650 mg (04/24/2018), 5,650 mg (05/08/2018), 5,650 mg (05/22/2018), 5,650 mg (06/10/2018), 5,650 mg (06/26/2018)  for chemotherapy  treatment.      Colon cancer metastasized to multiple sites [facility])   01/16/2018 Initial Diagnosis    Colon cancer metastasized to multiple sites [facility])    01/23/2018 - 02/10/2018 Chemotherapy    The patient had panitumumab (VECTIBIX) 600 mg in sodium chloride 0.9 % 100 mL chemo infusion, 640 mg, Intravenous,  Once, 1 of 3 cycles Administration: 600 mg (01/23/2018)  for chemotherapy treatment.     03/12/2018 -  Chemotherapy    The patient had  bevacizumab (AVASTIN) 500 mg in sodium chloride 0.9 % 100 mL chemo infusion, 525 mg, Intravenous,  Once, 8 of 10 cycles Administration: 500 mg (03/12/2018), 500 mg (03/27/2018), 500 mg (04/10/2018), 500 mg (04/24/2018), 500 mg (06/10/2018), 500 mg (05/08/2018), 500 mg (05/22/2018), 500 mg (06/26/2018)  for chemotherapy treatment.       CANCER STAGING: Cancer Staging Malignant neoplasm of transverse colon [facility]) Staging form: Colon and Rectum, AJCC 7th Edition - Pathologic stage from 03/04/2015: Stage IIIC (T4a, N2a, cM0) - Signed by Baird Cancer, PA-C on 03/04/2015 - Pathologic: M1 - Signed by Zoila Shutter, MD on 01/23/2018    INTERVAL HISTORY:  Mr. Burston 63 y.o. male returns for routine follow-up and consideration for next cycle of chemotherapy. He is here today alone. He states that Denies any nausea, or vomiting. Denies any new pains. Had not noticed any recent bleeding such as epistaxis, hematuria or hematochezia. Denies recent chest pain on exertion, pre-syncopal episodes, or palpitations. Denies any numbness or tingling in hands. Denies any recent fevers, infections, or recent hospitalizations. Patient reports appetite at 50% and energy level at 50%.      REVIEW OF SYSTEMS:  Review of Systems  Respiratory: Positive for shortness of breath.   Gastrointestinal: Positive for diarrhea. Negative for nausea.  Neurological: Positive for numbness.  All other systems reviewed and are negative.    PAST MEDICAL/SURGICAL HISTORY:  Past Medical History:  Diagnosis Date   Abnormal stress echocardiogram    Adenocarcinoma of transverse colon ([place]) 02/10/2015   Alcohol abuse    Heavy Use up until 2010   Anxiety    Blood transfusion without reported diagnosis    Cancer of overlapping sites of colon metastatic to intra-abdominal lymph node ([place]) 12/26/2017   COPD (chronic obstructive pulmonary disease) ([facility])    Depression    Emphysema of lung ([facility])    GERD (gastroesophageal reflux  disease)    Head trauma 2001   closed head injury; coma for 4 weeks   Hypercholesterolemia    Hypertension    ING HERN W/GANGREN RECUR UNILAT/UNSPEC ING HERN 04/21/2009   Qualifier: Diagnosis of  By: Verl Blalock, MD, [place] [facility]    PTSD (post-traumatic stress disorder)    Pulmonary nodule 06/14/2016   SAH (subarachnoid hemorrhage) ([place]) 08/31/2012   SDH (subdural hematoma) ([place]) 08/31/2012   Past Surgical History:  Procedure Laterality Date   BIOPSY  01/25/2016   Procedure: BIOPSY;  Surgeon: Danie Binder, MD;  [facility];  Service: Endoscopy;;  ileum;    cardiac cath     COLONOSCOPY  2011   Dr. Oneida Alar: multiple adenomas and hyperplastic polyps   COLONOSCOPY WITH PROPOFOL N/A 01/12/2015   Procedure: COLONOSCOPY WITH PROPOFOL;  Surgeon: Danie Binder, MD;  Location: [facility];  Service: Endoscopy;  Laterality: N/A;  1030   COLONOSCOPY WITH PROPOFOL N/A 01/25/2016   Procedure: COLONOSCOPY WITH PROPOFOL;  Surgeon: Danie Binder, MD;  Location: [facility];  Service: Endoscopy;  Laterality: N/A;  [place]   ESOPHAGOGASTRODUODENOSCOPY (EGD) WITH PROPOFOL N/A 01/12/2015   Procedure: ESOPHAGOGASTRODUODENOSCOPY (EGD) WITH PROPOFOL;  Surgeon: Danie Binder, MD;  [facility];  Service: Endoscopy;  Laterality: N/A;   head injury surgery     HERNIA REPAIR Right 2012   Inguinal- [facility]   KIDNEY SURGERY     >30 years ago   LAPAROSCOPIC RIGHT HEMI COLECTOMY Left 02/10/2015   Procedure: LAPAROSCOPIC THEN OPEN RIGHT HEMI COLECTOMY;  Surgeon: [name], MD;  Location: [facility] ORS;  Service: General;  Laterality: Left;   LAPAROTOMY N/A 12/19/2017   Procedure: EXPLORATORY LAPAROTOMY;  Surgeon: Aviva Signs, MD;  Location: [facility] OR[facility];  Service: General;  Laterality: N/A;   POLYPECTOMY  01/25/2016   Procedure: POLYPECTOMY;  Surgeon: Danie Binder, MD;  [facility];  Service: Endoscopy;;  colon   PORT-A-CATH REMOVAL N/A  02/26/2017   Procedure: MINOR REMOVAL PORT-A-CATH;  Surgeon: Aviva Signs, MD;  Location: [facility];  Service: General;  Laterality: N/A;  Pt to arrive at [place] N/A 03/24/2015   Procedure: [place];  Surgeon: Jules Husbands, MD;  Location: [facility] ORS;  Service: General;  Laterality: N/A;   PORTACATH PLACEMENT Left 01/07/2018   Procedure: INSERTION PORT A CATH (ATTACHED CATHETER IN LEFT SUBCLAVIAN);  Surgeon: Aviva Signs, MD;  Location: [facility];  Service: General;  Laterality: Left;     SOCIAL HISTORY:  Social History   Socioeconomic History   Marital status: Divorced    Spouse name: Not on file   Number of children: 2   Years of education: 12   Highest education level: Not on file  Occupational History   Occupation: disabled/unemployed   Occupation: unemployed/Mediicaid only    Comment: NO INCOME  Social Designer, fashion/clothing strain: Very hard   Food insecurity:    Worry: Often true    Inability: Often true   Transportation needs:    Medical: Yes    Non-medical: No  Tobacco Use   Smoking status: Former Smoker    Packs/day: 0.10    Years: 30.00    Pack years: 3.00    Types: Cigarettes    Start date: 02/06/1973    Last attempt to quit: 12/09/2017    Years since quitting: 0.5   Smokeless tobacco: Never Used   Tobacco comment: Quit on 12/09/2017  Substance and Sexual Activity   Alcohol use: Yes    Alcohol/week: 0.0 standard drinks    Comment: beer occ, history of ETOH abuse in remote past.    Drug use: No   Sexual activity: Never    Partners: Female    Birth control/protection: None  Lifestyle   Physical activity:    Days per week: Not on file    Minutes per session: Not on file   Stress: Not on file  Relationships   Social connections:    Talks on phone: Not on file    Gets together: Not on file    Attends religious service: Not on file    Active member of club or organization: Not on file    Attends meetings  of clubs or organizations: Not on file    Relationship status: Not on file   Intimate partner violence:    Fear of current or ex partner: Not on file    Emotionally abused: Not on file    Physically abused: Not on file    Forced sexual activity: Not on file  Other Topics Concern   Not on file  Social History Narrative   Single   Lives alone   Turned down for disability    FAMILY HISTORY:  Family History  Problem Relation Age of Onset   Pulmonary embolism Mother    Breast cancer Mother    Cancer Mother        breast cancer   Arthritis Mother    Heart disease Father    Cancer Father 49       Leukemia   Cancer Paternal Grandfather        Lung   Ataxia Neg Hx    Chorea Neg Hx    Dementia Neg Hx    Mental retardation Neg Hx    Migraines Neg Hx    Multiple sclerosis Neg Hx    Neurofibromatosis Neg Hx    Neuropathy Neg Hx    Parkinsonism Neg Hx    Seizures Neg Hx    Stroke Neg Hx    Colon cancer Neg Hx     CURRENT MEDICATIONS:  Outpatient Encounter Medications as of 06/26/2018  Medication Sig Note   amitriptyline (ELAVIL) 25 MG tablet Take 1 tablet (25 mg total) by mouth at bedtime.    atorvastatin (LIPITOR) 20 MG tablet TAKE 1 TABLET BY MOUTH AT BEDTIME    buPROPion (WELLBUTRIN SR) 150 MG 12 hr tablet Take 150 mg by mouth every morning.     busPIRone (BUSPAR) 10 MG tablet Take 10 mg by mouth 2 (two) times daily.    fluorouracil CALGB 90300 in sodium chloride 0.9 % 150 mL Inject 5,650 mg into the vein. Over 46 hours    FLUoxetine (PROZAC) 20 MG capsule TAKE 1 CAPSULE BY MOUTH 2 TIMES A DAY (Patient taking differently: Take 20 mg by mouth 2 (two) times daily. ) 06/10/2018: 1 tab TID   gabapentin (NEURONTIN) 300 MG capsule TAKE 1 CAPSULE BY MOUTH 3 TIMES A DAY    IRINOTECAN HCL IV Inject 420 mg into the vein every 14 (fourteen) days.    LEUCOVORIN CALCIUM IV Inject 944 mg into the vein every 14 (fourteen) days.    lidocaine-prilocaine (EMLA)  cream APPLY A SMALL AMOUNT OVER PORT SITE AND COVER WITH PLASTIC WRAP ONE HOUR PRIOR TO APPOINTMENT    loperamide (IMODIUM) 2 MG capsule 2 tablets twice daily    omeprazole (PRILOSEC) 20 MG capsule Take 20 mg by mouth every morning.     ondansetron (ZOFRAN) 8 MG tablet Take 1 tablet (8 mg total) by mouth 2 (two) times daily as needed for refractory nausea / vomiting. Start on day 3 after chemotherapy. (Patient not taking: Reported on 06/26/2018)    PROAIR HFA 108 (90 Base) MCG/ACT inhaler INHALE 2 PUFFS BY MOUTH EVERY 6 HOURS AS NEEDED FOR SHORTNESS OF BREATH/WHEEZING. (Patient not taking: No sig reported)    prochlorperazine (COMPAZINE) 10 MG tablet Take 1 tablet (10 mg total) by mouth every 6 (six) hours as needed (NAUSEA). (Patient not taking: Reported on 06/26/2018)    rivaroxaban (XARELTO) 20 MG TABS tablet Take 1 tablet (20 mg total) by mouth daily with supper.    rivaroxaban (XARELTO) 20 MG TABS tablet Take 1 tablet (20 mg total) by mouth daily with supper.    Rivaroxaban 15 & 20 MG TBPK Take 1 tablet twice daily for 21 days. Then switch to 20 mg tablet and take 1 tablet daily.    [DISCONTINUED] lidocaine (XYLOCAINE) 2 % solution Use as directed 15 mLs in the mouth or throat  every 4 (four) hours as needed for mouth pain (as an aid to eat and drink).    [DISCONTINUED] triamcinolone ointment (KENALOG) 0.5 % Apply 1 application topically 2 (two) times daily.    No facility-administered encounter medications on file as of 06/26/2018.     ALLERGIES:  No Known Allergies   PHYSICAL EXAM:  ECOG Performance status: 1  Vitals:   06/26/18 0838  BP: 115/78  Pulse: (!) 106  Resp: 18  Temp: 98.2 F (36.8 C)  SpO2: 97%   Filed Weights   06/26/18 0838  Weight: 228 lb (103.4 kg)    Physical Exam Vitals signs reviewed.  Constitutional:      Appearance: Normal appearance.  Cardiovascular:     Rate and Rhythm: Normal rate and regular rhythm.     Heart sounds: Normal heart sounds.    Pulmonary:     Effort: Pulmonary effort is normal.     Breath sounds: Normal breath sounds.  Abdominal:     General: There is no distension.     Palpations: Abdomen is soft. There is no mass.  Musculoskeletal:        General: No swelling.  Skin:    General: Skin is warm.  Neurological:     General: No focal deficit present.     Mental Status: He is alert and oriented to person, place, and time.  Psychiatric:        Mood and Affect: Mood normal.        Behavior: Behavior normal.      LABORATORY DATA:  I have reviewed the labs as listed.  CBC    Component Value Date/Time   WBC 3.6 (L) 06/26/2018 0818   RBC 4.71 06/26/2018 0818   HGB 15.3 06/26/2018 0818   HCT 47.2 06/26/2018 0818   PLT 184 06/26/2018 0818   MCV 100.2 (H) 06/26/2018 0818   MCH 32.5 06/26/2018 0818   MCHC 32.4 06/26/2018 0818   RDW 14.4 06/26/2018 0818   LYMPHSABS 0.9 06/26/2018 0818   MONOABS 0.9 06/26/2018 0818   EOSABS 0.1 06/26/2018 0818   BASOSABS 0.0 06/26/2018 0818   CMP Latest Ref Rng & Units 06/26/2018 06/10/2018 05/22/2018  Glucose 70 - 99 mg/dL 122(H) 163(H) 153(H)  BUN 6 - 20 mg/dL 9 5(L) 7  Creatinine 0.61 - 1.24 mg/dL 0.84 0.96 1.03  Sodium 135 - 145 mmol/L 136 135 135  Potassium 3.5 - 5.1 mmol/L 3.3(L) 4.3 3.4(L)  Chloride 98 - 111 mmol/L 100 100 97(L)  CO2 22 - 32 mmol/L _0 Calcium 8.9 - 10.3 mg/dL 8.9 8.8(L) 9.0  Total Protein 6.5 - 8.1 g/dL 6.7 7.0 6.9  Total Bilirubin 0.3 - 1.2 mg/dL 0.8 0.3 0.7  Alkaline Phos 38 - 126 U/L 90 91 106  AST 15 - 41 U/L 14(L) 19 25  ALT 0 - 44 U/L _1 DIAGNOSTIC IMAGING:  I have independently reviewed the scans and discussed with the patient.   I have reviewed Venita Lick LPN's note and agree with the documentation.  I personally performed a face-to-face visit, made revisions and my assessment and plan is as follows.    ASSESSMENT & PLAN:   Malignant neoplasm of transverse colon ([place]) 1.  Metastatic colon  cancer: -Foundation 1 CDX shows MS-stable, K-ras Q 61L,TP53 S215I, NRAS wild type, APC T935f*16, FAM123B [id number] - Status post FOLFOX for 12 cycles in the adjuvant setting from 03/30/2015 through 09/06/2015. -PET CT scan on  11/19/2017 showing peritoneal carcinomatosis, right paratracheal lymph node with mild FDG uptake with mild nonspecific increased uptake in the right hilar region.  CEA was elevated at 12.9 on 11/16/2017. - 9 cycles of FOLFIRI from 01/09/2018 through 05/08/2018 with addition of bevacizumab during cycle 5.  (After having severe rash with vectibix during cycle 2)  - CT CAP from 05/20/2018 shows minimal improvement in peritoneal metastasis.  Presumed peritoneal implant within the small bowel mesentery measures 1.5 x 1.1 cm.  A pelvic cul-de-sac implant measures 1.9 x 1.7 cm compared to 2.2 x 1.5 cm prior. -Maintenance therapy with 5-FU/leucovorin and bevacizumab started on 05/22/2018. - I have reviewed his labs.  He may proceed with his next cycle today.  We will reevaluate him in 2 weeks.  2.  Pulmonary embolism: -This was found to be incidental finding on CT chest done on 05/20/2018. -CT angiogram on 05/20/2018 showed bilateral pulmonary emboli. -He was started on Xarelto. Once he finished starter pack, he did not obtain refill. He has been off drug for 4 days. We will administer Lovenox today and restart him on Xarelto 20 mg daily.   3.  Peripheral neuropathy: -He has pre-existing neuropathy from prior oxaliplatin.  He has numbness in the legs.   Total time spent is 25 minutes with more than 50% of the time spent face-to-face discussing and reinforcing the need to take Xarelto, treatment plan and coordination of care.    Orders placed this encounter:  No orders of the defined types were placed in this encounter.     Derek Jack, MD [place] [phone]

## 2018-06-27 LAB — CEA: CEA: 5.1 ng/mL — ABNORMAL HIGH (ref 0.0–4.7)

## 2018-06-28 ENCOUNTER — Inpatient Hospital Stay (HOSPITAL_COMMUNITY): Payer: Medicaid Other

## 2018-06-28 ENCOUNTER — Encounter (HOSPITAL_COMMUNITY): Payer: Self-pay

## 2018-06-28 ENCOUNTER — Other Ambulatory Visit: Payer: Self-pay

## 2018-06-28 VITALS — BP 143/79 | HR 116 | Temp 98.6°F | Resp 18

## 2018-06-28 DIAGNOSIS — Z5111 Encounter for antineoplastic chemotherapy: Secondary | ICD-10-CM | POA: Diagnosis not present

## 2018-06-28 DIAGNOSIS — C188 Malignant neoplasm of overlapping sites of colon: Secondary | ICD-10-CM

## 2018-06-28 DIAGNOSIS — C184 Malignant neoplasm of transverse colon: Secondary | ICD-10-CM

## 2018-06-28 MED ORDER — HEPARIN SOD (PORK) LOCK FLUSH 100 UNIT/ML IV SOLN
500.0000 [IU] | Freq: Once | INTRAVENOUS | Status: AC | PRN
  Administered 2018-06-28: 500 [IU]

## 2018-06-28 MED ORDER — SODIUM CHLORIDE 0.9% FLUSH
10.0000 mL | INTRAVENOUS | Status: DC | PRN
  Administered 2018-06-28: 10 mL
  Filled 2018-06-28: qty 10

## 2018-06-28 NOTE — Patient Instructions (Signed)
[facility] Discharge Instructions for Patients Receiving Chemotherapy   Beginning January 23rd 2017 lab work for the [facility] will be done in the  Main lab at [facility] on 1st floor. If you have a lab appointment with the [facility] please come in thru the  Main Entrance and check in at the main information desk   Today you received the following chemotherapy agents 5FU pump discontinued with portacath flushed per protocol. Follow-up as scheduled. Call clinic for any questions or concerns  To help prevent nausea and vomiting after your treatment, we encourage you to take your nausea medication   If you develop nausea and vomiting, or diarrhea that is not controlled by your medication, call the clinic.  The clinic phone number is [phone]. Office hours are Monday-Friday 8:30am-5:00pm.  BELOW ARE SYMPTOMS THAT SHOULD BE REPORTED IMMEDIATELY:  *FEVER GREATER THAN 101.0 F  *CHILLS WITH OR WITHOUT FEVER  NAUSEA AND VOMITING THAT IS NOT CONTROLLED WITH YOUR NAUSEA MEDICATION  *UNUSUAL SHORTNESS OF BREATH  *UNUSUAL BRUISING OR BLEEDING  TENDERNESS IN MOUTH AND THROAT WITH OR WITHOUT PRESENCE OF ULCERS  *URINARY PROBLEMS  *BOWEL PROBLEMS  UNUSUAL RASH Items with * indicate a potential emergency and should be followed up as soon as possible. If you have an emergency after office hours please contact your primary care physician or go to the nearest emergency department.  Please call the clinic during office hours if you have any questions or concerns.   You may also contact the Patient Navigator at [phone] should you have any questions or need assistance in obtaining follow up care.      Resources For Cancer Patients and their Caregivers ? American Cancer Society: Can assist with transportation, wigs, general needs, runs Look Good Feel Better.        814-670-1729 ? Cancer Care: Provides financial assistance, online support groups,  medication/co-pay assistance.  1-800-813-HOPE 9702496932) ? [facility] Assists [place] cancer patients and their families through emotional , educational and financial support.  (630)600-7595 ? [place] DSS Where to apply for food stamps, Medicaid and utility assistance. 612 063 8189 ? [facility]: Transportation to medical appointments. (704) 246-9905 ? Social Security Administration: May apply for disability if have a Stage IV cancer. [phone] 937-637-0714 ? [employer], Disability and Transit Services: Assists with nutrition, care and transit needs. [phone]

## 2018-06-28 NOTE — Progress Notes (Signed)
Braian Tijerina Amorin tolerated 5FU pump well without complaints or incident. 5FU pump discontinued with portacath flushed easily per protocol then de-accessed. VSS Pt discharged self ambulatory using his cane in satisfactory condition

## 2018-07-03 ENCOUNTER — Other Ambulatory Visit (HOSPITAL_COMMUNITY): Payer: Self-pay | Admitting: Hematology

## 2018-07-03 DIAGNOSIS — G620 Drug-induced polyneuropathy: Secondary | ICD-10-CM

## 2018-07-10 ENCOUNTER — Inpatient Hospital Stay (HOSPITAL_COMMUNITY): Payer: Medicaid Other

## 2018-07-10 ENCOUNTER — Inpatient Hospital Stay (HOSPITAL_BASED_OUTPATIENT_CLINIC_OR_DEPARTMENT_OTHER): Payer: Medicaid Other | Admitting: Hematology

## 2018-07-10 ENCOUNTER — Encounter (HOSPITAL_COMMUNITY): Payer: Self-pay | Admitting: Hematology

## 2018-07-10 ENCOUNTER — Inpatient Hospital Stay (HOSPITAL_COMMUNITY): Payer: Medicaid Other | Attending: Hematology

## 2018-07-10 ENCOUNTER — Other Ambulatory Visit: Payer: Self-pay

## 2018-07-10 VITALS — BP 167/82 | HR 121 | Temp 97.9°F | Resp 18 | Wt 228.0 lb

## 2018-07-10 VITALS — BP 144/77 | HR 95 | Temp 97.9°F | Resp 18

## 2018-07-10 DIAGNOSIS — Z9049 Acquired absence of other specified parts of digestive tract: Secondary | ICD-10-CM

## 2018-07-10 DIAGNOSIS — G620 Drug-induced polyneuropathy: Secondary | ICD-10-CM

## 2018-07-10 DIAGNOSIS — Z7901 Long term (current) use of anticoagulants: Secondary | ICD-10-CM

## 2018-07-10 DIAGNOSIS — C772 Secondary and unspecified malignant neoplasm of intra-abdominal lymph nodes: Secondary | ICD-10-CM

## 2018-07-10 DIAGNOSIS — Z5112 Encounter for antineoplastic immunotherapy: Secondary | ICD-10-CM | POA: Diagnosis present

## 2018-07-10 DIAGNOSIS — C184 Malignant neoplasm of transverse colon: Secondary | ICD-10-CM

## 2018-07-10 DIAGNOSIS — C188 Malignant neoplasm of overlapping sites of colon: Secondary | ICD-10-CM

## 2018-07-10 DIAGNOSIS — R197 Diarrhea, unspecified: Secondary | ICD-10-CM

## 2018-07-10 DIAGNOSIS — C786 Secondary malignant neoplasm of retroperitoneum and peritoneum: Secondary | ICD-10-CM | POA: Insufficient documentation

## 2018-07-10 DIAGNOSIS — C189 Malignant neoplasm of colon, unspecified: Secondary | ICD-10-CM

## 2018-07-10 DIAGNOSIS — Z5111 Encounter for antineoplastic chemotherapy: Secondary | ICD-10-CM | POA: Diagnosis present

## 2018-07-10 DIAGNOSIS — I2699 Other pulmonary embolism without acute cor pulmonale: Secondary | ICD-10-CM

## 2018-07-10 DIAGNOSIS — Z87891 Personal history of nicotine dependence: Secondary | ICD-10-CM

## 2018-07-10 DIAGNOSIS — Z79899 Other long term (current) drug therapy: Secondary | ICD-10-CM

## 2018-07-10 LAB — CBC WITH DIFFERENTIAL/PLATELET
Abs Immature Granulocytes: 0.01 10*3/uL (ref 0.00–0.07)
Basophils Absolute: 0 10*3/uL (ref 0.0–0.1)
Basophils Relative: 1 %
Eosinophils Absolute: 0.1 10*3/uL (ref 0.0–0.5)
Eosinophils Relative: 2 %
HCT: 47.5 % (ref 39.0–52.0)
Hemoglobin: 15.4 g/dL (ref 13.0–17.0)
Immature Granulocytes: 0 %
Lymphocytes Relative: 19 %
Lymphs Abs: 0.8 10*3/uL (ref 0.7–4.0)
MCH: 32.6 pg (ref 26.0–34.0)
MCHC: 32.4 g/dL (ref 30.0–36.0)
MCV: 100.6 fL — ABNORMAL HIGH (ref 80.0–100.0)
Monocytes Absolute: 0.6 10*3/uL (ref 0.1–1.0)
Monocytes Relative: 13 %
Neutro Abs: 2.8 10*3/uL (ref 1.7–7.7)
Neutrophils Relative %: 65 %
Platelets: 141 10*3/uL — ABNORMAL LOW (ref 150–400)
RBC: 4.72 MIL/uL (ref 4.22–5.81)
RDW: 14.3 % (ref 11.5–15.5)
WBC: 4.3 10*3/uL (ref 4.0–10.5)
nRBC: 0 % (ref 0.0–0.2)

## 2018-07-10 LAB — COMPREHENSIVE METABOLIC PANEL
ALT: 16 U/L (ref 0–44)
AST: 26 U/L (ref 15–41)
Albumin: 3.6 g/dL (ref 3.5–5.0)
Alkaline Phosphatase: 95 U/L (ref 38–126)
Anion gap: 13 (ref 5–15)
BUN: 8 mg/dL (ref 6–20)
CO2: 27 mmol/L (ref 22–32)
Calcium: 9 mg/dL (ref 8.9–10.3)
Chloride: 99 mmol/L (ref 98–111)
Creatinine, Ser: 0.93 mg/dL (ref 0.61–1.24)
GFR calc Af Amer: >60 mL/min (ref >60)
GFR calc non Af Amer: >60 mL/min (ref >60)
Glucose, Bld: 108 mg/dL — ABNORMAL HIGH (ref 70–99)
Potassium: 5.1 mmol/L (ref 3.5–5.1)
Sodium: 139 mmol/L (ref 135–145)
Total Bilirubin: 0.6 mg/dL (ref 0.3–1.2)
Total Protein: 6.9 g/dL (ref 6.5–8.1)

## 2018-07-10 LAB — URINALYSIS, DIPSTICK ONLY
Bilirubin Urine: NEGATIVE
Glucose, UA: NEGATIVE mg/dL
Hgb urine dipstick: NEGATIVE
Ketones, ur: NEGATIVE mg/dL
Leukocytes,Ua: NEGATIVE
Nitrite: NEGATIVE
Protein, ur: NEGATIVE mg/dL
Specific Gravity, Urine: 1.012 (ref 1.005–1.030)
pH: 5 (ref 5.0–8.0)

## 2018-07-10 MED ORDER — LEUCOVORIN CALCIUM INJECTION 350 MG
900.0000 mg | Freq: Once | INTRAVENOUS | Status: AC
  Administered 2018-07-10: 900 mg via INTRAVENOUS
  Filled 2018-07-10: qty 45

## 2018-07-10 MED ORDER — PALONOSETRON HCL INJECTION 0.25 MG/5ML
0.2500 mg | Freq: Once | INTRAVENOUS | Status: AC
  Administered 2018-07-10: 0.25 mg via INTRAVENOUS
  Filled 2018-07-10: qty 5

## 2018-07-10 MED ORDER — SODIUM CHLORIDE 0.9 % IV SOLN
4.8000 mg/kg | Freq: Once | INTRAVENOUS | Status: AC
  Administered 2018-07-10: 11:00:00 500 mg via INTRAVENOUS
  Filled 2018-07-10: qty 16

## 2018-07-10 MED ORDER — SODIUM CHLORIDE 0.9 % IV SOLN
2400.0000 mg/m2 | INTRAVENOUS | Status: DC
  Administered 2018-07-10: 5650 mg via INTRAVENOUS
  Filled 2018-07-10: qty 113

## 2018-07-10 MED ORDER — SODIUM CHLORIDE 0.9 % IV SOLN
10.0000 mg | Freq: Once | INTRAVENOUS | Status: AC
  Administered 2018-07-10: 10 mg via INTRAVENOUS
  Filled 2018-07-10: qty 10

## 2018-07-10 MED ORDER — SODIUM CHLORIDE 0.9 % IV SOLN
Freq: Once | INTRAVENOUS | Status: AC
  Administered 2018-07-10: 10:00:00 via INTRAVENOUS

## 2018-07-10 MED ORDER — SODIUM CHLORIDE 0.9% FLUSH
10.0000 mL | INTRAVENOUS | Status: DC | PRN
  Administered 2018-07-10: 10 mL
  Filled 2018-07-10: qty 10

## 2018-07-10 MED ORDER — SODIUM CHLORIDE 0.9 % IV SOLN: Freq: Once | INTRAVENOUS | Status: AC

## 2018-07-10 MED ORDER — FLUOROURACIL CHEMO INJECTION 2.5 GM/50ML
400.0000 mg/m2 | Freq: Once | INTRAVENOUS | Status: AC
  Administered 2018-07-10: 950 mg via INTRAVENOUS
  Filled 2018-07-10: qty 19

## 2018-07-10 MED ORDER — ATROPINE SULFATE 1 MG/ML IJ SOLN
0.5000 mg | Freq: Once | INTRAMUSCULAR | Status: AC
  Administered 2018-07-10: 0.5 mg via INTRAVENOUS
  Filled 2018-07-10: qty 1

## 2018-07-10 NOTE — Assessment & Plan Note (Signed)
1.  Metastatic colon cancer: -Foundation 1 CDX shows MS-stable, K-ras Q 61L,TP53 S215I, NRAS wild type, APC T958f*16, FAM123B [phone] - Status post FOLFOX for 12 cycles in the adjuvant setting from 03/30/2015 through 09/06/2015. -PET CT scan on 11/19/2017 showing peritoneal carcinomatosis, right paratracheal lymph node with mild FDG uptake with mild nonspecific increased uptake in the right hilar region.  CEA was elevated at 12.9 on 11/16/2017. - 9 cycles of FOLFIRI from 01/09/2018 through 05/08/2018 with addition of bevacizumab during cycle 5.  (After having severe rash with vectibix during cycle 2)  - CT CAP from 05/20/2018 shows minimal improvement in peritoneal metastasis.  Presumed peritoneal implant within the small bowel mesentery measures 1.5 x 1.1 cm.  A pelvic cul-de-sac implant measures 1.9 x 1.7 cm compared to 2.2 x 1.5 cm prior. -Maintenance 5-FU/leucovorin and bevacizumab started on 05/22/2018. -He has some mild diarrhea but otherwise tolerated his last cycle very well. -I reviewed his labs.  He may proceed with his next cycle of treatment today.  We will reevaluate him in 2 weeks. - Last CEA is down to 5.1 on 06/26/2018.  2.  Pulmonary embolism: -Found as incidental finding on CT chest done on 05/20/2018.  CT angiogram on same day showed bilateral pulmonary emboli. -He is continuing to take Xarelto 20 mg daily without any major problems.  No bleeding issues.   3.  Peripheral neuropathy: -He has pre-existing neuropathy from prior oxaliplatin.  He has numbness in the legs.

## 2018-07-10 NOTE — Patient Instructions (Signed)
[facility] at [facility] Discharge Instructions  You were seen today by Dr. Katragadda. He went over your recent lab results. He will see you back in 2 weeks for labs and follow up.   Thank you for choosing [facility] at [facility] to provide your oncology and hematology care.  To afford each patient quality time with our provider, please arrive at least 15 minutes before your scheduled appointment time.   If you have a lab appointment with the Cancer Center please come in thru the  Main Entrance and check in at the main information desk  You need to re-schedule your appointment should you arrive 10 or more minutes late.  We strive to give you quality time with our providers, and arriving late affects you and other patients whose appointments are after yours.  Also, if you no show three or more times for appointments you may be dismissed from the clinic at the providers discretion.     Again, thank you for choosing [facility].  Our hope is that these requests will decrease the amount of time that you wait before being seen by our physicians.       _____________________________________________________________  Should you have questions after your visit to [facility], please contact our office at [phone] between the hours of 8:00 a.m. and 4:30 p.m.  Voicemails left after 4:00 p.m. will not be returned until the following business day.  For prescription refill requests, have your pharmacy contact our office and allow 72 hours.    Cancer Center Support Programs:   > Cancer Support Group  2nd Tuesday of the month 1pm-2pm, Journey Room    

## 2018-07-10 NOTE — Progress Notes (Signed)
[place], [place] [address]   CLINIC:  Medical Oncology/Hematology  PCP:  Patient, No Pcp Per No address on file None   REASON FOR VISIT:  Follow-up for metastatic colon cancer   BRIEF ONCOLOGIC HISTORY:  Oncology History   Stage IIIC ([id number]) adenocarcinoma of transverse colon, diagnosed on colonoscopy by Dr. Oneida Alar on 01/12/2015 followed by definitive surgery by Dr. [name] with right hemicolectomy on 02/12/2015.  He then underwent FOLFOX x 12 cycles in the adjuvant setting (03/30/2015- 09/06/2015).     Malignant neoplasm of transverse colon ([place])   01/12/2015 Pathologic Stage    Colon, biopsy, distal transverse - TUBULOVILLOUS ADENOMA WITH HIGH GRADE DYSPLASIA.    01/12/2015 Procedure    Colonoscopy by Dr. Oneida Alar.    01/12/2015 Tumor Marker    CEA: 6.6 (H)     01/18/2015 Imaging    CT abd/pelvis- Apple-core lesion identified in the mid transverse colon without obstruction. No evidence for lymphadenopathy in the gastrohepatic ligament or omentum.  Stable 8 mm hypo attenuating lesion in the left liver, likely a cyst.    02/10/2015 Initial Diagnosis    Adenocarcinoma of transverse colon ([place])    02/12/2015 Definitive Surgery    [name], Extended right hemicolectomy     02/12/2015 Pathology Results    Mucinous adenocarcinoma with penetration of visceral peritoneum, 4/19 lymph nodes for metastatic disease, negative resection margins, with LVI and perineural invasion    03/30/2015 - 09/06/2015 Chemotherapy    FOLFOX x 12 cycles    05/25/2015 Treatment Plan Change    5 FU bolus discontinued for cycle #5    06/08/2015 Treatment Plan Change    Treatment deferred x 1 week    06/15/2015 Treatment Plan Change    5FU CI decreased by 10% and Oxaliplatin reduced by 15% for cycles #6-#11; Oxaliplatin dropped for cycle #12 d/t neuropathy.     10/20/2015 Imaging    CT CAP- Right hemicolectomy without evidence of metastatic disease. 2.  Previously measured ground-glass lesion in the left upper lobe has resolved. 3. Probable food debris in the stomach, simulating gastric wall thickening. Please correlate clinically. 4. 6 mm irregular nodular density in the left upper lobe, stable. Continued attention on followup exams is warranted.    01/25/2016 Procedure    Colonoscopy by Dr. Oneida Alar- Non-thrombosed external hemorrhoids found on digital rectal exam. - One 4 mm polyp in the rectum, removed with a cold biopsy forceps. Resected and retrieved. - Congested mucosa in the neo-terminal ileum. Biopsied. - Redundant colon. - Internal hemorrhoids.    01/26/2016 Pathology Results    1. Terminal ileum, biopsy - MILD ACUTE (ACTIVE) ILEITIS. - NO DYSPLASIA OR MALIGNANCY IDENTIFIED - SEE COMMENT. 2. Rectum, polyp(s) - HYPERPLASTIC POLYP (X 1). - NO DYSPLASIA OR MALIGNANCY IDENTIFIED.    04/13/2016 Imaging    CT chest- Stable CT chest. 6 mm irregular nodule anterior left upper lobe is stable. The scattered areas of peribronchovascular micro nodularity in the lungs bilaterally are unchanged.    10/19/2016 Imaging    CT CAP: 1. Status post right hemicolectomy. No findings to suggest metastatic disease in the abdomen or pelvis. 2. Aortic atherosclerosis. 3. Additional incidental findings, as above. Aortic Atherosclerosis (ICD10-I70.0).    01/09/2018 -  Chemotherapy    The patient had palonosetron (ALOXI) injection 0.25 mg, 0.25 mg, Intravenous,  Once, 13 of 14 cycles Administration: 0.25 mg (01/09/2018), 0.25 mg (01/23/2018), 0.25 mg (02/11/2018), 0.25 mg (02/25/2018), 0.25 mg (03/12/2018),  0.25 mg (03/27/2018), 0.25 mg (04/10/2018), 0.25 mg (04/24/2018), 0.25 mg (05/08/2018), 0.25 mg (05/22/2018), 0.25 mg (06/10/2018), 0.25 mg (06/26/2018), 0.25 mg (07/10/2018) irinotecan (CAMPTOSAR) 420 mg in dextrose 5 % 500 mL chemo infusion, 180 mg/m2 = 420 mg, Intravenous,  Once, 9 of 9 cycles Administration: 420 mg (01/09/2018), 420 mg (01/23/2018), 420 mg  (02/11/2018), 420 mg (02/25/2018), 420 mg (03/12/2018), 420 mg (03/27/2018), 420 mg (04/10/2018), 420 mg (04/24/2018), 420 mg (05/08/2018) leucovorin 900 mg in dextrose 5 % 250 mL infusion, 944 mg, Intravenous,  Once, 13 of 14 cycles Administration: 900 mg (01/09/2018), 900 mg (01/23/2018), 900 mg (02/11/2018), 900 mg (02/25/2018), 900 mg (03/12/2018), 900 mg (03/27/2018), 900 mg (04/10/2018), 900 mg (04/24/2018), 900 mg (05/08/2018), 900 mg (06/10/2018), 900 mg (06/26/2018), 900 mg (07/10/2018) fluorouracil (ADRUCIL) chemo injection 950 mg, 400 mg/m2 = 950 mg, Intravenous,  Once, 13 of 14 cycles Administration: 950 mg (01/09/2018), 950 mg (01/23/2018), 950 mg (02/11/2018), 950 mg (02/25/2018), 950 mg (03/12/2018), 950 mg (03/27/2018), 950 mg (04/10/2018), 950 mg (04/24/2018), 950 mg (05/08/2018), 950 mg (05/22/2018), 950 mg (06/10/2018), 950 mg (06/26/2018), 950 mg (07/10/2018) fluorouracil (ADRUCIL) 5,650 mg in sodium chloride 0.9 % 137 mL chemo infusion, 2,400 mg/m2 = 5,650 mg, Intravenous, 1 Day/Dose, 13 of 14 cycles Administration: 5,650 mg (01/09/2018), 5,650 mg (01/23/2018), 5,650 mg (02/11/2018), 5,650 mg (02/25/2018), 5,650 mg (03/12/2018), 5,650 mg (03/27/2018), 5,650 mg (04/10/2018), 5,650 mg (04/24/2018), 5,650 mg (05/08/2018), 5,650 mg (05/22/2018), 5,650 mg (06/10/2018), 5,650 mg (06/26/2018), 5,650 mg (07/10/2018)  for chemotherapy treatment.     01/23/2018 Cancer Staging    Staging form: Colon and Rectum, AJCC 7th Edition - Pathologic: M1 - Signed by Zoila Shutter, MD on 01/23/2018     Malignant neoplasm of overlapping sites of colon ([place])   12/26/2017 Initial Diagnosis    Cancer of overlapping sites of colon metastatic to intra-abdominal lymph node ([place])    01/09/2018 -  Chemotherapy    The patient had palonosetron (ALOXI) injection 0.25 mg, 0.25 mg, Intravenous,  Once, 13 of 14 cycles Administration: 0.25 mg (01/09/2018), 0.25 mg (01/23/2018), 0.25 mg (02/11/2018), 0.25 mg (02/25/2018), 0.25 mg (03/12/2018), 0.25 mg (03/27/2018), 0.25 mg (04/10/2018),  0.25 mg (04/24/2018), 0.25 mg (05/08/2018), 0.25 mg (05/22/2018), 0.25 mg (06/10/2018), 0.25 mg (06/26/2018), 0.25 mg (07/10/2018) irinotecan (CAMPTOSAR) 420 mg in dextrose 5 % 500 mL chemo infusion, 180 mg/m2 = 420 mg, Intravenous,  Once, 9 of 9 cycles Administration: 420 mg (01/09/2018), 420 mg (01/23/2018), 420 mg (02/11/2018), 420 mg (02/25/2018), 420 mg (03/12/2018), 420 mg (03/27/2018), 420 mg (04/10/2018), 420 mg (04/24/2018), 420 mg (05/08/2018) leucovorin 900 mg in dextrose 5 % 250 mL infusion, 944 mg, Intravenous,  Once, 13 of 14 cycles Administration: 900 mg (01/09/2018), 900 mg (01/23/2018), 900 mg (02/11/2018), 900 mg (02/25/2018), 900 mg (03/12/2018), 900 mg (03/27/2018), 900 mg (04/10/2018), 900 mg (04/24/2018), 900 mg (05/08/2018), 900 mg (06/10/2018), 900 mg (06/26/2018), 900 mg (07/10/2018) fluorouracil (ADRUCIL) chemo injection 950 mg, 400 mg/m2 = 950 mg, Intravenous,  Once, 13 of 14 cycles Administration: 950 mg (01/09/2018), 950 mg (01/23/2018), 950 mg (02/11/2018), 950 mg (02/25/2018), 950 mg (03/12/2018), 950 mg (03/27/2018), 950 mg (04/10/2018), 950 mg (04/24/2018), 950 mg (05/08/2018), 950 mg (05/22/2018), 950 mg (06/10/2018), 950 mg (06/26/2018), 950 mg (07/10/2018) fluorouracil (ADRUCIL) 5,650 mg in sodium chloride 0.9 % 137 mL chemo infusion, 2,400 mg/m2 = 5,650 mg, Intravenous, 1 Day/Dose, 13 of 14 cycles Administration: 5,650 mg (01/09/2018), 5,650 mg (01/23/2018), 5,650 mg (02/11/2018), 5,650 mg (02/25/2018), 5,650 mg (03/12/2018), 5,650 mg (03/27/2018), 5,650  mg (04/10/2018), 5,650 mg (04/24/2018), 5,650 mg (05/08/2018), 5,650 mg (05/22/2018), 5,650 mg (06/10/2018), 5,650 mg (06/26/2018), 5,650 mg (07/10/2018)  for chemotherapy treatment.      Colon cancer metastasized to multiple sites [facility])   01/16/2018 Initial Diagnosis    Colon cancer metastasized to multiple sites [facility])    01/23/2018 - 02/10/2018 Chemotherapy    The patient had panitumumab (VECTIBIX) 600 mg in sodium chloride 0.9 % 100 mL chemo infusion, 640 mg, Intravenous,  Once, 1 of  3 cycles Administration: 600 mg (01/23/2018)  for chemotherapy treatment.     03/12/2018 -  Chemotherapy    The patient had bevacizumab (AVASTIN) 500 mg in sodium chloride 0.9 % 100 mL chemo infusion, 525 mg, Intravenous,  Once, 9 of 14 cycles Administration: 500 mg (03/12/2018), 500 mg (03/27/2018), 500 mg (04/10/2018), 500 mg (04/24/2018), 500 mg (06/10/2018), 500 mg (05/08/2018), 500 mg (05/22/2018), 500 mg (06/26/2018), 500 mg (07/10/2018)  for chemotherapy treatment.       CANCER STAGING: Cancer Staging Malignant neoplasm of transverse colon [facility]) Staging form: Colon and Rectum, AJCC 7th Edition - Pathologic stage from 03/04/2015: Stage IIIC (T4a, N2a, cM0) - Signed by Baird Cancer, PA-C on 03/04/2015 - Pathologic: M1 - Signed by Zoila Shutter, MD on 01/23/2018    INTERVAL HISTORY:  Mr. Schimming 63 y.o. male returns for routine follow-up and consideration for next cycle of chemotherapy. He is here today alone.  He states that he had a recent fainting episode and hit his head, due to his BP. He states that he has been taking the Xarelto as prescribed with no missed doses. He states that he continues to have bad diarrhea, he is taking imodium as directed. Denies any nausea, or vomiting. Denies any new pains. Had not noticed any recent bleeding such as epistaxis, hematuria or hematochezia. Denies recent chest pain on exertion, shortness of breath on minimal exertion, pre-syncopal episodes, or palpitations. Denies any numbness or tingling in hands or feet. Denies any recent fevers, infections, or recent hospitalizations. Patient reports appetite at 100% and energy level at 50%.     REVIEW OF SYSTEMS:  Review of Systems  Gastrointestinal: Positive for diarrhea.     PAST MEDICAL/SURGICAL HISTORY:  Past Medical History:  Diagnosis Date   Abnormal stress echocardiogram    Adenocarcinoma of transverse colon ([place]) 02/10/2015   Alcohol abuse    Heavy Use up until 2010   Anxiety    Blood  transfusion without reported diagnosis    Cancer of overlapping sites of colon metastatic to intra-abdominal lymph node ([place]) 12/26/2017   COPD (chronic obstructive pulmonary disease) ([facility])    Depression    Emphysema of lung ([facility])    GERD (gastroesophageal reflux disease)    Head trauma 2001   closed head injury; coma for 4 weeks   Hypercholesterolemia    Hypertension    ING HERN W/GANGREN RECUR UNILAT/UNSPEC ING HERN 04/21/2009   Qualifier: Diagnosis of  By: Verl Blalock, MD, [place] [facility]    PTSD (post-traumatic stress disorder)    Pulmonary nodule 06/14/2016   SAH (subarachnoid hemorrhage) ([place]) 08/31/2012   SDH (subdural hematoma) ([place]) 08/31/2012   Past Surgical History:  Procedure Laterality Date   BIOPSY  01/25/2016   Procedure: BIOPSY;  Surgeon: Danie Binder, MD;  [facility];  Service: Endoscopy;;  ileum;    cardiac cath     COLONOSCOPY  2011   Dr. Oneida Alar: multiple adenomas and hyperplastic polyps   COLONOSCOPY WITH PROPOFOL N/A 01/12/2015  Procedure: COLONOSCOPY WITH PROPOFOL;  Surgeon: Danie Binder, MD;  [facility]: [facility];  Service: Endoscopy;  Laterality: N/A;  1030   COLONOSCOPY WITH PROPOFOL N/A 01/25/2016   Procedure: COLONOSCOPY WITH PROPOFOL;  Surgeon: Danie Binder, MD;  [facility];  Service: Endoscopy;  Laterality: N/A;  [place]   ESOPHAGOGASTRODUODENOSCOPY (EGD) WITH PROPOFOL N/A 01/12/2015   Procedure: ESOPHAGOGASTRODUODENOSCOPY (EGD) WITH PROPOFOL;  Surgeon: Danie Binder, MD;  [facility];  Service: Endoscopy;  Laterality: N/A;   head injury surgery     HERNIA REPAIR Right 2012   Inguinal- [facility]   KIDNEY SURGERY     >30 years ago   LAPAROSCOPIC RIGHT HEMI COLECTOMY Left 02/10/2015   Procedure: LAPAROSCOPIC THEN OPEN RIGHT HEMI COLECTOMY;  Surgeon: [name], MD;  Location: [facility] ORS;  Service: General;  Laterality: Left;   LAPAROTOMY N/A 12/19/2017   Procedure:  EXPLORATORY LAPAROTOMY;  Surgeon: Aviva Signs, MD;  Location: [facility];  Service: General;  Laterality: N/A;   POLYPECTOMY  01/25/2016   Procedure: POLYPECTOMY;  Surgeon: Danie Binder, MD;  [facility];  Service: Endoscopy;;  colon   PORT-A-CATH REMOVAL N/A 02/26/2017   Procedure: MINOR REMOVAL PORT-A-CATH;  Surgeon: Aviva Signs, MD;  Location: [facility];  Service: General;  Laterality: N/A;  Pt to arrive at [place] N/A 03/24/2015   Procedure: [place];  Surgeon: Jules Husbands, MD;  Location: [facility] ORS;  Service: General;  Laterality: N/A;   PORTACATH PLACEMENT Left 01/07/2018   Procedure: INSERTION PORT A CATH (ATTACHED CATHETER IN LEFT SUBCLAVIAN);  Surgeon: Aviva Signs, MD;  Location: [facility];  Service: General;  Laterality: Left;     SOCIAL HISTORY:  Social History   Socioeconomic History   Marital status: Divorced    Spouse name: Not on file   Number of children: 2   Years of education: 12   Highest education level: Not on file  Occupational History   Occupation: disabled/unemployed   Occupation: unemployed/Mediicaid only    Comment: NO INCOME  Social Designer, fashion/clothing strain: Very hard   Food insecurity:    Worry: Often true    Inability: Often true   Transportation needs:    Medical: Yes    Non-medical: No  Tobacco Use   Smoking status: Former Smoker    Packs/day: 0.10    Years: 30.00    Pack years: 3.00    Types: Cigarettes    Start date: 02/06/1973    Last attempt to quit: 12/09/2017    Years since quitting: 0.5   Smokeless tobacco: Never Used   Tobacco comment: Quit on 12/09/2017  Substance and Sexual Activity   Alcohol use: Yes    Alcohol/week: 0.0 standard drinks    Comment: beer occ, history of ETOH abuse in remote past.    Drug use: No   Sexual activity: Never    Partners: Female    Birth control/protection: None  Lifestyle   Physical activity:    Days per week: Not on file     Minutes per session: Not on file   Stress: Not on file  Relationships   Social connections:    Talks on phone: Not on file    Gets together: Not on file    Attends religious service: Not on file    Active member of club or organization: Not on file    Attends meetings of clubs or  organizations: Not on file    Relationship status: Not on file   Intimate partner violence:    Fear of current or ex partner: Not on file    Emotionally abused: Not on file    Physically abused: Not on file    Forced sexual activity: Not on file  Other Topics Concern   Not on file  Social History Narrative   Single   Lives alone   Turned down for disability    FAMILY HISTORY:  Family History  Problem Relation Age of Onset   Pulmonary embolism Mother    Breast cancer Mother    Cancer Mother        breast cancer   Arthritis Mother    Heart disease Father    Cancer Father 71       Leukemia   Cancer Paternal Grandfather        Lung   Ataxia Neg Hx    Chorea Neg Hx    Dementia Neg Hx    Mental retardation Neg Hx    Migraines Neg Hx    Multiple sclerosis Neg Hx    Neurofibromatosis Neg Hx    Neuropathy Neg Hx    Parkinsonism Neg Hx    Seizures Neg Hx    Stroke Neg Hx    Colon cancer Neg Hx     CURRENT MEDICATIONS:  Outpatient Encounter Medications as of 07/10/2018  Medication Sig Note   amitriptyline (ELAVIL) 25 MG tablet Take 1 tablet (25 mg total) by mouth at bedtime.    atorvastatin (LIPITOR) 20 MG tablet TAKE 1 TABLET BY MOUTH AT BEDTIME    buPROPion (WELLBUTRIN SR) 150 MG 12 hr tablet Take 150 mg by mouth every morning.     busPIRone (BUSPAR) 10 MG tablet Take 10 mg by mouth 2 (two) times daily.    fluorouracil CALGB 43329 in sodium chloride 0.9 % 150 mL Inject 5,650 mg into the vein. Over 46 hours    FLUoxetine (PROZAC) 20 MG capsule TAKE 1 CAPSULE BY MOUTH 2 TIMES A DAY (Patient taking differently: Take 20 mg by mouth 2 (two) times daily. ) 06/10/2018: 1 tab  TID   gabapentin (NEURONTIN) 300 MG capsule TAKE 1 CAPSULE BY MOUTH THREE TIMES A DAY    IRINOTECAN HCL IV Inject 420 mg into the vein every 14 (fourteen) days.    LEUCOVORIN CALCIUM IV Inject 944 mg into the vein every 14 (fourteen) days.    lidocaine-prilocaine (EMLA) cream APPLY A SMALL AMOUNT OVER PORT SITE AND COVER WITH PLASTIC WRAP ONE HOUR PRIOR TO APPOINTMENT    loperamide (IMODIUM) 2 MG capsule 2 tablets twice daily    omeprazole (PRILOSEC) 20 MG capsule Take 20 mg by mouth every morning.     ondansetron (ZOFRAN) 8 MG tablet Take 1 tablet (8 mg total) by mouth 2 (two) times daily as needed for refractory nausea / vomiting. Start on day 3 after chemotherapy.    PROAIR HFA 108 (90 Base) MCG/ACT inhaler INHALE 2 PUFFS BY MOUTH EVERY 6 HOURS AS NEEDED FOR SHORTNESS OF BREATH/WHEEZING.    prochlorperazine (COMPAZINE) 10 MG tablet Take 1 tablet (10 mg total) by mouth every 6 (six) hours as needed (NAUSEA).    rivaroxaban (XARELTO) 20 MG TABS tablet Take 1 tablet (20 mg total) by mouth daily with supper.    Rivaroxaban 15 & 20 MG TBPK Take 1 tablet twice daily for 21 days. Then switch to 20 mg tablet and take 1 tablet daily.    [  DISCONTINUED] rivaroxaban (XARELTO) 20 MG TABS tablet Take 1 tablet (20 mg total) by mouth daily with supper.    No facility-administered encounter medications on file as of 07/10/2018.     ALLERGIES:  No Known Allergies   PHYSICAL EXAM:  ECOG Performance status: 1  Vitals:   07/10/18 0831  BP: (!) 167/82  Pulse: (!) 121  Resp: 18  Temp: 97.9 F (36.6 C)  SpO2: 95%   Filed Weights   07/10/18 0831  Weight: 228 lb (103.4 kg)    Physical Exam Vitals signs reviewed.  Constitutional:      Appearance: Normal appearance.  Cardiovascular:     Rate and Rhythm: Normal rate and regular rhythm.     Heart sounds: Normal heart sounds.  Pulmonary:     Effort: Pulmonary effort is normal.     Breath sounds: Normal breath sounds.  Abdominal:      General: There is no distension.     Palpations: Abdomen is soft. There is no mass.  Musculoskeletal:        General: No swelling.  Skin:    General: Skin is warm.  Neurological:     General: No focal deficit present.     Mental Status: He is alert and oriented to person, place, and time.  Psychiatric:        Mood and Affect: Mood normal.        Behavior: Behavior normal.      LABORATORY DATA:  I have reviewed the labs as listed.  CBC    Component Value Date/Time   WBC 4.3 07/10/2018 0812   RBC 4.72 07/10/2018 0812   HGB 15.4 07/10/2018 0812   HCT 47.5 07/10/2018 0812   PLT 141 (L) 07/10/2018 0812   MCV 100.6 (H) 07/10/2018 0812   MCH 32.6 07/10/2018 0812   MCHC 32.4 07/10/2018 0812   RDW 14.3 07/10/2018 0812   LYMPHSABS 0.8 07/10/2018 0812   MONOABS 0.6 07/10/2018 0812   EOSABS 0.1 07/10/2018 0812   BASOSABS 0.0 07/10/2018 0812   CMP Latest Ref Rng & Units 07/10/2018 06/26/2018 06/10/2018  Glucose 70 - 99 mg/dL 108(H) 122(H) 163(H)  BUN 6 - 20 mg/dL 8 9 5(L)  Creatinine 0.61 - 1.24 mg/dL 0.93 0.84 0.96  Sodium 135 - 145 mmol/L 139 136 135  Potassium 3.5 - 5.1 mmol/L 5.1 3.3(L) 4.3  Chloride 98 - 111 mmol/L 99 100 100  CO2 22 - 32 mmol/L _0 Calcium 8.9 - 10.3 mg/dL 9.0 8.9 8.8(L)  Total Protein 6.5 - 8.1 g/dL 6.9 6.7 7.0  Total Bilirubin 0.3 - 1.2 mg/dL 0.6 0.8 0.3  Alkaline Phos 38 - 126 U/L 95 90 91  AST 15 - 41 U/L 26 14(L) 19  ALT 0 - 44 U/L _1 DIAGNOSTIC IMAGING:  I have independently reviewed the scans and discussed with the patient.   I have reviewed Venita Lick LPN's note and agree with the documentation.  I personally performed a face-to-face visit, made revisions and my assessment and plan is as follows.    ASSESSMENT & PLAN:   Malignant neoplasm of transverse colon ([place]) 1.  Metastatic colon cancer: -Foundation 1 CDX shows MS-stable, K-ras Q 61L,TP53 S215I, NRAS wild type, APC T921f*16, [id number] [id number] - Status post FOLFOX  for 12 cycles in the adjuvant setting from 03/30/2015 through 09/06/2015. -PET CT scan on 11/19/2017 showing peritoneal carcinomatosis, right paratracheal lymph node with mild FDG uptake with mild nonspecific  increased uptake in the right hilar region.  CEA was elevated at 12.9 on 11/16/2017. - 9 cycles of FOLFIRI from 01/09/2018 through 05/08/2018 with addition of bevacizumab during cycle 5.  (After having severe rash with vectibix during cycle 2)  - CT CAP from 05/20/2018 shows minimal improvement in peritoneal metastasis.  Presumed peritoneal implant within the small bowel mesentery measures 1.5 x 1.1 cm.  A pelvic cul-de-sac implant measures 1.9 x 1.7 cm compared to 2.2 x 1.5 cm prior. -Maintenance 5-FU/leucovorin and bevacizumab started on 05/22/2018. -He has some mild diarrhea but otherwise tolerated his last cycle very well. -I reviewed his labs.  He may proceed with his next cycle of treatment today.  We will reevaluate him in 2 weeks. - Last CEA is down to 5.1 on 06/26/2018.  2.  Pulmonary embolism: -Found as incidental finding on CT chest done on 05/20/2018.  CT angiogram on same day showed bilateral pulmonary emboli. -He is continuing to take Xarelto 20 mg daily without any major problems.  No bleeding issues.   3.  Peripheral neuropathy: -He has pre-existing neuropathy from prior oxaliplatin.  He has numbness in the legs.   Total time spent is 25 minutes with more than 50% of the time spent face-to-face discussing treatment plan and coordination of care.    Orders placed this encounter:  Orders Placed This Encounter  Procedures   CBC with Differential/Platelet   Comprehensive metabolic panel   Magnesium      Derek Jack, MD [place] [phone]

## 2018-07-10 NOTE — Progress Notes (Signed)
Labs reviewed with MD during office visit, HR elevated and noted by MD. Proceed with treatment.   Treatment given per orders. Patient tolerated it well without problems. Vitals stable and discharged home from clinic ambulatory. Follow up as scheduled.

## 2018-07-10 NOTE — Progress Notes (Signed)
Port not give blood.  Peripheral IV for lab work.

## 2018-07-11 ENCOUNTER — Other Ambulatory Visit: Payer: Self-pay

## 2018-07-12 ENCOUNTER — Inpatient Hospital Stay (HOSPITAL_COMMUNITY): Payer: Medicaid Other

## 2018-07-12 VITALS — BP 135/80 | HR 97 | Temp 98.0°F | Resp 18

## 2018-07-12 DIAGNOSIS — C188 Malignant neoplasm of overlapping sites of colon: Secondary | ICD-10-CM

## 2018-07-12 DIAGNOSIS — C184 Malignant neoplasm of transverse colon: Secondary | ICD-10-CM

## 2018-07-12 DIAGNOSIS — Z5111 Encounter for antineoplastic chemotherapy: Secondary | ICD-10-CM | POA: Diagnosis not present

## 2018-07-12 MED ORDER — HEPARIN SOD (PORK) LOCK FLUSH 100 UNIT/ML IV SOLN
500.0000 [IU] | Freq: Once | INTRAVENOUS | Status: AC | PRN
  Administered 2018-07-12: 500 [IU]

## 2018-07-12 MED ORDER — SODIUM CHLORIDE 0.9% FLUSH
10.0000 mL | INTRAVENOUS | Status: DC | PRN
  Administered 2018-07-12: 10 mL
  Filled 2018-07-12: qty 10

## 2018-07-24 ENCOUNTER — Other Ambulatory Visit: Payer: Self-pay

## 2018-07-24 ENCOUNTER — Encounter (HOSPITAL_COMMUNITY): Payer: Self-pay

## 2018-07-24 ENCOUNTER — Inpatient Hospital Stay (HOSPITAL_COMMUNITY): Payer: Medicaid Other

## 2018-07-24 ENCOUNTER — Encounter (HOSPITAL_COMMUNITY): Payer: Self-pay | Admitting: Hematology

## 2018-07-24 ENCOUNTER — Inpatient Hospital Stay (HOSPITAL_BASED_OUTPATIENT_CLINIC_OR_DEPARTMENT_OTHER): Payer: Medicaid Other | Admitting: Hematology

## 2018-07-24 VITALS — BP 142/82 | HR 96 | Temp 98.0°F | Resp 18

## 2018-07-24 DIAGNOSIS — C184 Malignant neoplasm of transverse colon: Secondary | ICD-10-CM

## 2018-07-24 DIAGNOSIS — R197 Diarrhea, unspecified: Secondary | ICD-10-CM

## 2018-07-24 DIAGNOSIS — Z7901 Long term (current) use of anticoagulants: Secondary | ICD-10-CM

## 2018-07-24 DIAGNOSIS — C786 Secondary malignant neoplasm of retroperitoneum and peritoneum: Secondary | ICD-10-CM | POA: Diagnosis not present

## 2018-07-24 DIAGNOSIS — C189 Malignant neoplasm of colon, unspecified: Secondary | ICD-10-CM

## 2018-07-24 DIAGNOSIS — Z9049 Acquired absence of other specified parts of digestive tract: Secondary | ICD-10-CM

## 2018-07-24 DIAGNOSIS — C188 Malignant neoplasm of overlapping sites of colon: Secondary | ICD-10-CM

## 2018-07-24 DIAGNOSIS — I2699 Other pulmonary embolism without acute cor pulmonale: Secondary | ICD-10-CM | POA: Diagnosis not present

## 2018-07-24 DIAGNOSIS — E538 Deficiency of other specified B group vitamins: Secondary | ICD-10-CM

## 2018-07-24 DIAGNOSIS — C772 Secondary and unspecified malignant neoplasm of intra-abdominal lymph nodes: Secondary | ICD-10-CM | POA: Diagnosis not present

## 2018-07-24 DIAGNOSIS — R530 Neoplastic (malignant) related fatigue: Secondary | ICD-10-CM

## 2018-07-24 DIAGNOSIS — Z5111 Encounter for antineoplastic chemotherapy: Secondary | ICD-10-CM | POA: Diagnosis not present

## 2018-07-24 DIAGNOSIS — G620 Drug-induced polyneuropathy: Secondary | ICD-10-CM

## 2018-07-24 DIAGNOSIS — Z87891 Personal history of nicotine dependence: Secondary | ICD-10-CM

## 2018-07-24 DIAGNOSIS — Z79899 Other long term (current) drug therapy: Secondary | ICD-10-CM

## 2018-07-24 LAB — CBC WITH DIFFERENTIAL/PLATELET
Abs Immature Granulocytes: 0.01 10*3/uL (ref 0.00–0.07)
Basophils Absolute: 0.1 10*3/uL (ref 0.0–0.1)
Basophils Relative: 2 %
Eosinophils Absolute: 0.1 10*3/uL (ref 0.0–0.5)
Eosinophils Relative: 3 %
HCT: 46.3 % (ref 39.0–52.0)
Hemoglobin: 15 g/dL (ref 13.0–17.0)
Immature Granulocytes: 0 %
Lymphocytes Relative: 30 %
Lymphs Abs: 1.1 10*3/uL (ref 0.7–4.0)
MCH: 32.1 pg (ref 26.0–34.0)
MCHC: 32.4 g/dL (ref 30.0–36.0)
MCV: 98.9 fL (ref 80.0–100.0)
Monocytes Absolute: 0.5 10*3/uL (ref 0.1–1.0)
Monocytes Relative: 14 %
Neutro Abs: 2 10*3/uL (ref 1.7–7.7)
Neutrophils Relative %: 51 %
Platelets: 174 10*3/uL (ref 150–400)
RBC: 4.68 MIL/uL (ref 4.22–5.81)
RDW: 14.6 % (ref 11.5–15.5)
WBC: 3.8 10*3/uL — ABNORMAL LOW (ref 4.0–10.5)
nRBC: 0 % (ref 0.0–0.2)

## 2018-07-24 LAB — MAGNESIUM: Magnesium: 2.1 mg/dL (ref 1.7–2.4)

## 2018-07-24 LAB — COMPREHENSIVE METABOLIC PANEL
ALT: 18 U/L (ref 0–44)
AST: 23 U/L (ref 15–41)
Albumin: 3.6 g/dL (ref 3.5–5.0)
Alkaline Phosphatase: 91 U/L (ref 38–126)
Anion gap: 13 (ref 5–15)
BUN: 10 mg/dL (ref 6–20)
CO2: 25 mmol/L (ref 22–32)
Calcium: 9.1 mg/dL (ref 8.9–10.3)
Chloride: 100 mmol/L (ref 98–111)
Creatinine, Ser: 0.74 mg/dL (ref 0.61–1.24)
GFR calc Af Amer: >60 mL/min (ref >60)
GFR calc non Af Amer: >60 mL/min (ref >60)
Glucose, Bld: 97 mg/dL (ref 70–99)
Potassium: 4.7 mmol/L (ref 3.5–5.1)
Sodium: 138 mmol/L (ref 135–145)
Total Bilirubin: 0.5 mg/dL (ref 0.3–1.2)
Total Protein: 6.7 g/dL (ref 6.5–8.1)

## 2018-07-24 MED ORDER — FLUOROURACIL CHEMO INJECTION 2.5 GM/50ML
400.0000 mg/m2 | Freq: Once | INTRAVENOUS | Status: AC
  Administered 2018-07-24: 950 mg via INTRAVENOUS
  Filled 2018-07-24: qty 19

## 2018-07-24 MED ORDER — ATROPINE SULFATE 1 MG/ML IJ SOLN
0.5000 mg | Freq: Once | INTRAMUSCULAR | Status: AC
  Administered 2018-07-24: 0.5 mg via INTRAVENOUS
  Filled 2018-07-24: qty 1

## 2018-07-24 MED ORDER — SODIUM CHLORIDE 0.9 % IV SOLN
Freq: Once | INTRAVENOUS | Status: AC
  Administered 2018-07-24: 10:00:00 via INTRAVENOUS

## 2018-07-24 MED ORDER — SODIUM CHLORIDE 0.9 % IV SOLN
2400.0000 mg/m2 | INTRAVENOUS | Status: DC
  Administered 2018-07-24: 5650 mg via INTRAVENOUS
  Filled 2018-07-24: qty 113

## 2018-07-24 MED ORDER — LEUCOVORIN CALCIUM INJECTION 350 MG
900.0000 mg | Freq: Once | INTRAVENOUS | Status: AC
  Administered 2018-07-24: 900 mg via INTRAVENOUS
  Filled 2018-07-24: qty 45

## 2018-07-24 MED ORDER — PALONOSETRON HCL INJECTION 0.25 MG/5ML
0.2500 mg | Freq: Once | INTRAVENOUS | Status: AC
  Administered 2018-07-24: 0.25 mg via INTRAVENOUS
  Filled 2018-07-24: qty 5

## 2018-07-24 MED ORDER — SODIUM CHLORIDE 0.9 % IV SOLN
10.0000 mg | Freq: Once | INTRAVENOUS | Status: AC
  Administered 2018-07-24: 10 mg via INTRAVENOUS
  Filled 2018-07-24: qty 10

## 2018-07-24 MED ORDER — SODIUM CHLORIDE 0.9 % IV SOLN
4.8000 mg/kg | Freq: Once | INTRAVENOUS | Status: AC
  Administered 2018-07-24: 500 mg via INTRAVENOUS
  Filled 2018-07-24: qty 16

## 2018-07-24 MED ORDER — CYANOCOBALAMIN 1000 MCG/ML IJ SOLN
1000.0000 ug | Freq: Once | INTRAMUSCULAR | Status: AC
  Administered 2018-07-24: 1000 ug via INTRAMUSCULAR
  Filled 2018-07-24: qty 1

## 2018-07-24 MED ORDER — SODIUM CHLORIDE 0.9% FLUSH
10.0000 mL | INTRAVENOUS | Status: DC | PRN
  Administered 2018-07-24: 10 mL
  Filled 2018-07-24: qty 10

## 2018-07-24 NOTE — Patient Instructions (Signed)
[facility] Discharge Instructions for Patients Receiving Chemotherapy  Today you received the following chemotherapy agents   To help prevent nausea and vomiting after your treatment, we encourage you to take your nausea medication   If you develop nausea and vomiting that is not controlled by your nausea medication, call the clinic.   BELOW ARE SYMPTOMS THAT SHOULD BE REPORTED IMMEDIATELY:  *FEVER GREATER THAN 100.5 F  *CHILLS WITH OR WITHOUT FEVER  NAUSEA AND VOMITING THAT IS NOT CONTROLLED WITH YOUR NAUSEA MEDICATION  *UNUSUAL SHORTNESS OF BREATH  *UNUSUAL BRUISING OR BLEEDING  TENDERNESS IN MOUTH AND THROAT WITH OR WITHOUT PRESENCE OF ULCERS  *URINARY PROBLEMS  *BOWEL PROBLEMS  UNUSUAL RASH Items with * indicate a potential emergency and should be followed up as soon as possible.  Feel free to call the clinic should you have any questions or concerns. The clinic phone number is [phone].  Please show the CHEMO ALERT CARD at check-in to the Emergency Department and triage nurse.   

## 2018-07-24 NOTE — Assessment & Plan Note (Signed)
1.  Metastatic colon cancer: -Foundation 1 CDX shows MS-stable, K-ras Q 61L,TP53 S215I, NRAS wild type, APC T943f*16, FAM123B [phone] - Status post FOLFOX for 12 cycles in the adjuvant setting from 03/30/2015 through 09/06/2015. -PET CT scan on 11/19/2017 showing peritoneal carcinomatosis, right paratracheal lymph node with mild FDG uptake with mild nonspecific increased uptake in the right hilar region.  CEA was elevated at 12.9 on 11/16/2017. - 9 cycles of FOLFIRI from 01/09/2018 through 05/08/2018 with addition of bevacizumab during cycle 5.  (After having severe rash with vectibix during cycle 2)  - CT CAP from 05/20/2018 shows minimal improvement in peritoneal metastasis.  Presumed peritoneal implant within the small bowel mesentery measures 1.5 x 1.1 cm.  A pelvic cul-de-sac implant measures 1.9 x 1.7 cm compared to 2.2 x 1.5 cm prior. -Maintenance 5-FU/leucovorin and bevacizumab started on 05/22/2018. -He felt more tired after last treatment.  He did have more diarrhea after last treatment. -He is taking Lomotil 2 tablets twice daily.  I have told him to increase it to 2 tablets 3 times a day. -He will proceed with his next maintenance cycle today.  I plan to scan him after next treatment.  We will see him back in the first week of July.  2.  Pulmonary embolism: -Incidental finding on CT chest done on 05/20/2018.  CT angiogram on same day showed bilateral pulmonary emboli. -He is continuing to take Xarelto 20 mg daily without any bleeding issues.    3.  Peripheral neuropathy: -He has pre-existing neuropathy from prior oxaliplatin.  Numbness in the legs has been stable.

## 2018-07-24 NOTE — Progress Notes (Signed)
[address] [place], [place] [address]   CLINIC:  Medical Oncology/Hematology  PCP:  Patient, No Pcp Per No address on file None   REASON FOR VISIT:  Follow-up for metastatic colon cancer   BRIEF ONCOLOGIC HISTORY:  Oncology History Overview Note  Stage IIIC ([id number]) adenocarcinoma of transverse colon, diagnosed on colonoscopy by Dr. Oneida Alar on 01/12/2015 followed by definitive surgery by Dr. [name] with right hemicolectomy on 02/12/2015.  He then underwent FOLFOX x 12 cycles in the adjuvant setting (03/30/2015- 09/06/2015).   Malignant neoplasm of transverse colon ([place])  01/12/2015 Pathologic Stage   Colon, biopsy, distal transverse - TUBULOVILLOUS ADENOMA WITH HIGH GRADE DYSPLASIA.   01/12/2015 Procedure   Colonoscopy by Dr. Oneida Alar.   01/12/2015 Tumor Marker   CEA: 6.6 (H)    01/18/2015 Imaging   CT abd/pelvis- Apple-core lesion identified in the mid transverse colon without obstruction. No evidence for lymphadenopathy in the gastrohepatic ligament or omentum.  Stable 8 mm hypo attenuating lesion in the left liver, likely a cyst.   02/10/2015 Initial Diagnosis   Adenocarcinoma of transverse colon ([place])   02/12/2015 Definitive Surgery   [name], Extended right hemicolectomy    02/12/2015 Pathology Results   Mucinous adenocarcinoma with penetration of visceral peritoneum, 4/19 lymph nodes for metastatic disease, negative resection margins, with LVI and perineural invasion   03/30/2015 - 09/06/2015 Chemotherapy   FOLFOX x 12 cycles   05/25/2015 Treatment Plan Change   5 FU bolus discontinued for cycle #5   06/08/2015 Treatment Plan Change   Treatment deferred x 1 week   06/15/2015 Treatment Plan Change   5FU CI decreased by 10% and Oxaliplatin reduced by 15% for cycles #6-#11; Oxaliplatin dropped for cycle #12 d/t neuropathy.    10/20/2015 Imaging   CT CAP- Right hemicolectomy without evidence of metastatic disease. 2. Previously  measured ground-glass lesion in the left upper lobe has resolved. 3. Probable food debris in the stomach, simulating gastric wall thickening. Please correlate clinically. 4. 6 mm irregular nodular density in the left upper lobe, stable. Continued attention on followup exams is warranted.   01/25/2016 Procedure   Colonoscopy by Dr. Oneida Alar- Non-thrombosed external hemorrhoids found on digital rectal exam. - One 4 mm polyp in the rectum, removed with a cold biopsy forceps. Resected and retrieved. - Congested mucosa in the neo-terminal ileum. Biopsied. - Redundant colon. - Internal hemorrhoids.   01/26/2016 Pathology Results   1. Terminal ileum, biopsy - MILD ACUTE (ACTIVE) ILEITIS. - NO DYSPLASIA OR MALIGNANCY IDENTIFIED - SEE COMMENT. 2. Rectum, polyp(s) - HYPERPLASTIC POLYP (X 1). - NO DYSPLASIA OR MALIGNANCY IDENTIFIED.   04/13/2016 Imaging   CT chest- Stable CT chest. 6 mm irregular nodule anterior left upper lobe is stable. The scattered areas of peribronchovascular micro nodularity in the lungs bilaterally are unchanged.   10/19/2016 Imaging   CT CAP: 1. Status post right hemicolectomy. No findings to suggest metastatic disease in the abdomen or pelvis. 2. Aortic atherosclerosis. 3. Additional incidental findings, as above. Aortic Atherosclerosis (ICD10-I70.0).   01/09/2018 -  Chemotherapy   The patient had palonosetron (ALOXI) injection 0.25 mg, 0.25 mg, Intravenous,  Once, 14 of 17 cycles Administration: 0.25 mg (01/09/2018), 0.25 mg (01/23/2018), 0.25 mg (02/11/2018), 0.25 mg (02/25/2018), 0.25 mg (03/12/2018), 0.25 mg (03/27/2018), 0.25 mg (04/10/2018), 0.25 mg (04/24/2018), 0.25 mg (05/08/2018), 0.25 mg (05/22/2018), 0.25 mg (06/10/2018), 0.25 mg (06/26/2018), 0.25 mg (07/10/2018) irinotecan (CAMPTOSAR) 420 mg in dextrose 5 % 500 mL chemo infusion,  180 mg/m2 = 420 mg, Intravenous,  Once, 9 of 9 cycles Administration: 420 mg (01/09/2018), 420 mg (01/23/2018), 420 mg (02/11/2018), 420 mg  (02/25/2018), 420 mg (03/12/2018), 420 mg (03/27/2018), 420 mg (04/10/2018), 420 mg (04/24/2018), 420 mg (05/08/2018) leucovorin 900 mg in dextrose 5 % 250 mL infusion, 944 mg, Intravenous,  Once, 14 of 17 cycles Administration: 900 mg (01/09/2018), 900 mg (01/23/2018), 900 mg (02/11/2018), 900 mg (02/25/2018), 900 mg (03/12/2018), 900 mg (03/27/2018), 900 mg (04/10/2018), 900 mg (04/24/2018), 900 mg (05/08/2018), 900 mg (06/10/2018), 900 mg (06/26/2018), 900 mg (07/10/2018) fluorouracil (ADRUCIL) chemo injection 950 mg, 400 mg/m2 = 950 mg, Intravenous,  Once, 14 of 17 cycles Administration: 950 mg (01/09/2018), 950 mg (01/23/2018), 950 mg (02/11/2018), 950 mg (02/25/2018), 950 mg (03/12/2018), 950 mg (03/27/2018), 950 mg (04/10/2018), 950 mg (04/24/2018), 950 mg (05/08/2018), 950 mg (05/22/2018), 950 mg (06/10/2018), 950 mg (06/26/2018), 950 mg (07/10/2018) fluorouracil (ADRUCIL) 5,650 mg in sodium chloride 0.9 % 137 mL chemo infusion, 2,400 mg/m2 = 5,650 mg, Intravenous, 1 Day/Dose, 14 of 17 cycles Administration: 5,650 mg (01/09/2018), 5,650 mg (01/23/2018), 5,650 mg (02/11/2018), 5,650 mg (02/25/2018), 5,650 mg (03/12/2018), 5,650 mg (03/27/2018), 5,650 mg (04/10/2018), 5,650 mg (04/24/2018), 5,650 mg (05/08/2018), 5,650 mg (05/22/2018), 5,650 mg (06/10/2018), 5,650 mg (06/26/2018), 5,650 mg (07/10/2018)  for chemotherapy treatment.    01/23/2018 Cancer Staging   Staging form: Colon and Rectum, AJCC 7th Edition - Pathologic: M1 - Signed by Zoila Shutter, MD on 01/23/2018   Malignant neoplasm of overlapping sites of colon ([place])  12/26/2017 Initial Diagnosis   Cancer of overlapping sites of colon metastatic to intra-abdominal lymph node ([place])   01/09/2018 -  Chemotherapy   The patient had palonosetron (ALOXI) injection 0.25 mg, 0.25 mg, Intravenous,  Once, 13 of 14 cycles Administration: 0.25 mg (01/09/2018), 0.25 mg (01/23/2018), 0.25 mg (02/11/2018), 0.25 mg (02/25/2018), 0.25 mg (03/12/2018), 0.25 mg (03/27/2018), 0.25 mg (04/10/2018), 0.25 mg (04/24/2018), 0.25 mg  (05/08/2018), 0.25 mg (05/22/2018), 0.25 mg (06/10/2018), 0.25 mg (06/26/2018), 0.25 mg (07/10/2018) irinotecan (CAMPTOSAR) 420 mg in dextrose 5 % 500 mL chemo infusion, 180 mg/m2 = 420 mg, Intravenous,  Once, 9 of 9 cycles Administration: 420 mg (01/09/2018), 420 mg (01/23/2018), 420 mg (02/11/2018), 420 mg (02/25/2018), 420 mg (03/12/2018), 420 mg (03/27/2018), 420 mg (04/10/2018), 420 mg (04/24/2018), 420 mg (05/08/2018) leucovorin 900 mg in dextrose 5 % 250 mL infusion, 944 mg, Intravenous,  Once, 13 of 14 cycles Administration: 900 mg (01/09/2018), 900 mg (01/23/2018), 900 mg (02/11/2018), 900 mg (02/25/2018), 900 mg (03/12/2018), 900 mg (03/27/2018), 900 mg (04/10/2018), 900 mg (04/24/2018), 900 mg (05/08/2018), 900 mg (06/10/2018), 900 mg (06/26/2018), 900 mg (07/10/2018) fluorouracil (ADRUCIL) chemo injection 950 mg, 400 mg/m2 = 950 mg, Intravenous,  Once, 13 of 14 cycles Administration: 950 mg (01/09/2018), 950 mg (01/23/2018), 950 mg (02/11/2018), 950 mg (02/25/2018), 950 mg (03/12/2018), 950 mg (03/27/2018), 950 mg (04/10/2018), 950 mg (04/24/2018), 950 mg (05/08/2018), 950 mg (05/22/2018), 950 mg (06/10/2018), 950 mg (06/26/2018), 950 mg (07/10/2018) fluorouracil (ADRUCIL) 5,650 mg in sodium chloride 0.9 % 137 mL chemo infusion, 2,400 mg/m2 = 5,650 mg, Intravenous, 1 Day/Dose, 13 of 14 cycles Administration: 5,650 mg (01/09/2018), 5,650 mg (01/23/2018), 5,650 mg (02/11/2018), 5,650 mg (02/25/2018), 5,650 mg (03/12/2018), 5,650 mg (03/27/2018), 5,650 mg (04/10/2018), 5,650 mg (04/24/2018), 5,650 mg (05/08/2018), 5,650 mg (05/22/2018), 5,650 mg (06/10/2018), 5,650 mg (06/26/2018), 5,650 mg (07/10/2018)  for chemotherapy treatment.    Colon cancer metastasized to multiple sites [facility])  01/16/2018 Initial Diagnosis   Colon cancer metastasized to  multiple sites [facility])   01/23/2018 - 02/10/2018 Chemotherapy   The patient had panitumumab (VECTIBIX) 600 mg in sodium chloride 0.9 % 100 mL chemo infusion, 640 mg, Intravenous,  Once, 1 of 3 cycles Administration: 600 mg  (01/23/2018)  for chemotherapy treatment.    03/12/2018 -  Chemotherapy   The patient had bevacizumab (AVASTIN) 500 mg in sodium chloride 0.9 % 100 mL chemo infusion, 525 mg, Intravenous,  Once, 10 of 14 cycles Administration: 500 mg (03/12/2018), 500 mg (03/27/2018), 500 mg (04/10/2018), 500 mg (04/24/2018), 500 mg (06/10/2018), 500 mg (05/08/2018), 500 mg (05/22/2018), 500 mg (06/26/2018), 500 mg (07/10/2018)  for chemotherapy treatment.       CANCER STAGING: Cancer Staging Malignant neoplasm of transverse colon [facility]) Staging form: Colon and Rectum, AJCC 7th Edition - Pathologic stage from 03/04/2015: Stage IIIC (T4a, N2a, cM0) - Signed by Baird Cancer, PA-C on 03/04/2015 - Pathologic: M1 - Signed by Zoila Shutter, MD on 01/23/2018    INTERVAL HISTORY:  Mr. Runnion 63 y.o. male here for follow-up and maintenance cycle of chemotherapy.  He felt more tired in the last 2 weeks.  He had more diarrhea.  He is currently taking Lomotil 2 tablets twice daily.  Numbness in the feet has been stable.  He is continuing Xarelto.  No worsening shortness of breath.  No bleeding episodes.  Denies any fevers or infections.  No skin rashes reported.  Appetite is 25%.  Energy levels are 25%.     REVIEW OF SYSTEMS:  Review of Systems  Constitutional: Positive for fatigue.  Gastrointestinal: Positive for diarrhea.  Neurological: Positive for numbness.  All other systems reviewed and are negative.    PAST MEDICAL/SURGICAL HISTORY:  Past Medical History:  Diagnosis Date  . Abnormal stress echocardiogram   . Adenocarcinoma of transverse colon ([place]) 02/10/2015  . Alcohol abuse    Heavy Use up until 2010  . Anxiety   . Blood transfusion without reported diagnosis   . Cancer of overlapping sites of colon metastatic to intra-abdominal lymph node ([place]) 12/26/2017  . COPD (chronic obstructive pulmonary disease) ([place])   . Depression   . Emphysema of lung ([place])   . GERD (gastroesophageal reflux disease)   . Head  trauma 2001   closed head injury; coma for 4 weeks  . Hypercholesterolemia   . Hypertension   . ING HERN W/GANGREN RECUR UNILAT/UNSPEC ING HERN 04/21/2009   Qualifier: Diagnosis of  By: Verl Blalock, MD, Delanna Ahmadi PTSD (post-traumatic stress disorder)   . Pulmonary nodule 06/14/2016  . SAH (subarachnoid hemorrhage) ([place]) 08/31/2012  . SDH (subdural hematoma) ([place]) 08/31/2012   Past Surgical History:  Procedure Laterality Date  . BIOPSY  01/25/2016   Procedure: BIOPSY;  Surgeon: Danie Binder, MD;  [facility];  Service: Endoscopy;;  ileum;   . cardiac cath    . COLONOSCOPY  2011   Dr. Oneida Alar: multiple adenomas and hyperplastic polyps  . COLONOSCOPY WITH PROPOFOL N/A 01/12/2015   Procedure: COLONOSCOPY WITH PROPOFOL;  Surgeon: Danie Binder, MD;  Location: [facility];  Service: Endoscopy;  Laterality: N/A;  1030  . COLONOSCOPY WITH PROPOFOL N/A 01/25/2016   Procedure: COLONOSCOPY WITH PROPOFOL;  Surgeon: Danie Binder, MD;  Location: [facility];  Service: Endoscopy;  Laterality: N/A;  1230  . CRANIOTOMY  2001  . ESOPHAGOGASTRODUODENOSCOPY (EGD) WITH PROPOFOL N/A 01/12/2015   Procedure: ESOPHAGOGASTRODUODENOSCOPY (EGD) WITH PROPOFOL;  Surgeon: Danie Binder, MD;  Location[facility];  Service: Endoscopy;  Laterality: N/A;  . head injury surgery    . HERNIA REPAIR Right 2012   Inguinal- [place]  . KIDNEY SURGERY     >30 years ago  . LAPAROSCOPIC RIGHT HEMI COLECTOMY Left 02/10/2015   Procedure: LAPAROSCOPIC THEN OPEN RIGHT HEMI COLECTOMY;  Surgeon: [name], MD;  Location: [facility] ORS;  Service: General;  Laterality: Left;  . LAPAROTOMY N/A 12/19/2017   Procedure: EXPLORATORY LAPAROTOMY;  Surgeon: Aviva Signs, MD;  Location: [facility];  Service: General;  Laterality: N/A;  . POLYPECTOMY  01/25/2016   Procedure: POLYPECTOMY;  Surgeon: Danie Binder, MD;  [facility];  Service: Endoscopy;;  colon  . PORT-A-CATH REMOVAL N/A 02/26/2017   Procedure:  MINOR REMOVAL PORT-A-CATH;  Surgeon: Aviva Signs, MD;  Location: [facility] OR[facility];  Service: General;  Laterality: N/A;  Pt to arrive at [place] N/A 03/24/2015   Procedure: INSERTION PORT-A-CATH;  Surgeon: Jules Husbands, MD;  Location: [facility] ORS;  Service: General;  Laterality: N/A;  . PORTACATH PLACEMENT Left 01/07/2018   Procedure: INSERTION PORT A CATH (ATTACHED CATHETER IN LEFT SUBCLAVIAN);  Surgeon: Aviva Signs, MD;  Location: [facility];  Service: General;  Laterality: Left;     SOCIAL HISTORY:  Social History   Socioeconomic History  . Marital status: Divorced    Spouse name: Not on file  . Number of children: 2  . Years of education: 69  . Highest education level: Not on file  Occupational History  . Occupation: disabled/unemployed  . Occupation: unemployed/Mediicaid only    Comment: NO INCOME  Social Needs  . Financial resource strain: Very hard  . Food insecurity    Worry: Often true    Inability: Often true  . Transportation needs    Medical: Yes    Non-medical: No  Tobacco Use  . Smoking status: Former Smoker    Packs/day: 0.10    Years: 30.00    Pack years: 3.00    Types: Cigarettes    Start date: 02/06/1973    Quit date: 12/09/2017    Years since quitting: 0.6  . Smokeless tobacco: Never Used  . Tobacco comment: Quit on 12/09/2017  Substance and Sexual Activity  . Alcohol use: Yes    Alcohol/week: 0.0 standard drinks    Comment: beer occ, history of ETOH abuse in remote past.   . Drug use: No  . Sexual activity: Never    Partners: Female    Birth control/protection: None  Lifestyle  . Physical activity    Days per week: Not on file    Minutes per session: Not on file  . Stress: Not on file  Relationships  . Social Herbalist on phone: Not on file    Gets together: Not on file    Attends religious service: Not on file    Active member of club or organization: Not on file    Attends meetings of clubs or organizations: Not on file     Relationship status: Not on file  . Intimate partner violence    Fear of current or ex partner: Not on file    Emotionally abused: Not on file    Physically abused: Not on file    Forced sexual activity: Not on file  Other Topics Concern  . Not on file  Social History Narrative   Single   Lives alone   Turned down for disability    FAMILY HISTORY:  Family  History  Problem Relation Age of Onset  . Pulmonary embolism Mother   . Breast cancer Mother   . Cancer Mother        breast cancer  . Arthritis Mother   . Heart disease Father   . Cancer Father 56       Leukemia  . Cancer Paternal Grandfather        Lung  . Ataxia Neg Hx   . Chorea Neg Hx   . Dementia Neg Hx   . Mental retardation Neg Hx   . Migraines Neg Hx   . Multiple sclerosis Neg Hx   . Neurofibromatosis Neg Hx   . Neuropathy Neg Hx   . Parkinsonism Neg Hx   . Seizures Neg Hx   . Stroke Neg Hx   . Colon cancer Neg Hx     CURRENT MEDICATIONS:  Outpatient Encounter Medications as of 07/24/2018  Medication Sig Note  . amitriptyline (ELAVIL) 25 MG tablet Take 1 tablet (25 mg total) by mouth at bedtime.   Marland Kitchen atorvastatin (LIPITOR) 20 MG tablet TAKE 1 TABLET BY MOUTH AT BEDTIME   . buPROPion (WELLBUTRIN SR) 150 MG 12 hr tablet Take 150 mg by mouth every morning.    . busPIRone (BUSPAR) 10 MG tablet Take 10 mg by mouth 2 (two) times daily.   . fluorouracil CALGB 11572 in sodium chloride 0.9 % 150 mL Inject 5,650 mg into the vein. Over 46 hours   . FLUoxetine (PROZAC) 20 MG capsule TAKE 1 CAPSULE BY MOUTH 2 TIMES A DAY (Patient taking differently: Take 20 mg by mouth 2 (two) times daily. ) 06/10/2018: 1 tab TID  . gabapentin (NEURONTIN) 300 MG capsule TAKE 1 CAPSULE BY MOUTH THREE TIMES A DAY   . IRINOTECAN HCL IV Inject 420 mg into the vein every 14 (fourteen) days.   Marland Kitchen LEUCOVORIN CALCIUM IV Inject 944 mg into the vein every 14 (fourteen) days.   Marland Kitchen lidocaine-prilocaine (EMLA) cream APPLY A SMALL AMOUNT OVER PORT  SITE AND COVER WITH PLASTIC WRAP ONE HOUR PRIOR TO APPOINTMENT   . loperamide (IMODIUM) 2 MG capsule 2 tablets twice daily   . omeprazole (PRILOSEC) 20 MG capsule Take 20 mg by mouth every morning.    . ondansetron (ZOFRAN) 8 MG tablet Take 1 tablet (8 mg total) by mouth 2 (two) times daily as needed for refractory nausea / vomiting. Start on day 3 after chemotherapy.   Marland Kitchen PROAIR HFA 108 (90 Base) MCG/ACT inhaler INHALE 2 PUFFS BY MOUTH EVERY 6 HOURS AS NEEDED FOR SHORTNESS OF BREATH/WHEEZING.   Marland Kitchen prochlorperazine (COMPAZINE) 10 MG tablet Take 1 tablet (10 mg total) by mouth every 6 (six) hours as needed (NAUSEA).   . rivaroxaban (XARELTO) 20 MG TABS tablet Take 1 tablet (20 mg total) by mouth daily with supper.   . [DISCONTINUED] Rivaroxaban 15 & 20 MG TBPK Take 1 tablet twice daily for 21 days. Then switch to 20 mg tablet and take 1 tablet daily.    No facility-administered encounter medications on file as of 07/24/2018.     ALLERGIES:  No Known Allergies   PHYSICAL EXAM:  ECOG Performance status: 1  Vitals:   07/24/18 0843  BP: 136/86  Pulse: (!) 112  Resp: 16  Temp: 98 F (36.7 C)  SpO2: 94%   Filed Weights   07/24/18 0843  Weight: 233 lb 14.4 oz (106.1 kg)    Physical Exam Vitals signs reviewed.  Constitutional:      Appearance: Normal  appearance.  Cardiovascular:     Rate and Rhythm: Normal rate and regular rhythm.     Heart sounds: Normal heart sounds.  Pulmonary:     Effort: Pulmonary effort is normal.     Breath sounds: Normal breath sounds.  Abdominal:     General: There is no distension.     Palpations: Abdomen is soft. There is no mass.  Musculoskeletal:        General: No swelling.  Skin:    General: Skin is warm.  Neurological:     General: No focal deficit present.     Mental Status: He is alert and oriented to person, place, and time.  Psychiatric:        Mood and Affect: Mood normal.        Behavior: Behavior normal.      LABORATORY DATA:  I  have reviewed the labs as listed.  CBC    Component Value Date/Time   WBC 3.8 (L) 07/24/2018 0802   RBC 4.68 07/24/2018 0802   HGB 15.0 07/24/2018 0802   HCT 46.3 07/24/2018 0802   PLT 174 07/24/2018 0802   MCV 98.9 07/24/2018 0802   MCH 32.1 07/24/2018 0802   MCHC 32.4 07/24/2018 0802   RDW 14.6 07/24/2018 0802   LYMPHSABS 1.1 07/24/2018 0802   MONOABS 0.5 07/24/2018 0802   EOSABS 0.1 07/24/2018 0802   BASOSABS 0.1 07/24/2018 0802   CMP Latest Ref Rng & Units 07/24/2018 07/10/2018 06/26/2018  Glucose 70 - 99 mg/dL 97 108(H) 122(H)  BUN 6 - 20 mg/dL '10 8 9  ' Creatinine 0.61 - 1.24 mg/dL 0.74 0.93 0.84  Sodium 135 - 145 mmol/L 138 139 136  Potassium 3.5 - 5.1 mmol/L 4.7 5.1 3.3(L)  Chloride 98 - 111 mmol/L 100 99 100  CO2 22 - 32 mmol/L '25 27 26  ' Calcium 8.9 - 10.3 mg/dL 9.1 9.0 8.9  Total Protein 6.5 - 8.1 g/dL 6.7 6.9 6.7  Total Bilirubin 0.3 - 1.2 mg/dL 0.5 0.6 0.8  Alkaline Phos 38 - 126 U/L 91 95 90  AST 15 - 41 U/L 23 26 14(L)  ALT 0 - 44 U/L '18 16 12       ' DIAGNOSTIC IMAGING:  I have independently reviewed the scans and discussed with the patient.   I have reviewed Venita Lick LPN's note and agree with the documentation.  I personally performed a face-to-face visit, made revisions and my assessment and plan is as follows.    ASSESSMENT & PLAN:   Malignant neoplasm of transverse colon ([place]) 1.  Metastatic colon cancer: -Foundation 1 CDX shows MS-stable, K-ras Q 61L,TP53 S215I, NRAS wild type, APC T987f*16, FAM123B [id number] - Status post FOLFOX for 12 cycles in the adjuvant setting from 03/30/2015 through 09/06/2015. -PET CT scan on 11/19/2017 showing peritoneal carcinomatosis, right paratracheal lymph node with mild FDG uptake with mild nonspecific increased uptake in the right hilar region.  CEA was elevated at 12.9 on 11/16/2017. - 9 cycles of FOLFIRI from 01/09/2018 through 05/08/2018 with addition of bevacizumab during cycle 5.  (After having severe rash with  vectibix during cycle 2)  - CT CAP from 05/20/2018 shows minimal improvement in peritoneal metastasis.  Presumed peritoneal implant within the small bowel mesentery measures 1.5 x 1.1 cm.  A pelvic cul-de-sac implant measures 1.9 x 1.7 cm compared to 2.2 x 1.5 cm prior. -Maintenance 5-FU/leucovorin and bevacizumab started on 05/22/2018. -He felt more tired after last treatment.  He did have more diarrhea after last  treatment. -He is taking Lomotil 2 tablets twice daily.  I have told him to increase it to 2 tablets 3 times a day. -He will proceed with his next maintenance cycle today.  I plan to scan him after next treatment.  We will see him back in the first week of July.  2.  Pulmonary embolism: -Incidental finding on CT chest done on 05/20/2018.  CT angiogram on same day showed bilateral pulmonary emboli. -He is continuing to take Xarelto 20 mg daily without any bleeding issues.    3.  Peripheral neuropathy: -He has pre-existing neuropathy from prior oxaliplatin.  Numbness in the legs has been stable.   Total time spent is 25 minutes with more than 50% of the time spent face-to-face discussing treatment plan and coordination of care.    Orders placed this encounter:  No orders of the defined types were placed in this encounter.     Derek Jack, MD [place] [phone]

## 2018-07-24 NOTE — Patient Instructions (Addendum)
[place] at [facility] Discharge Instructions  You were seen today by Dr. Delton Coombes. He went over your recent lab results. He will see you back in 2 1/2 weeks   for labs and follow up.   Thank you for choosing [place] at [facility] to provide your oncology and hematology care.  To afford each patient quality time with our provider, please arrive at least 15 minutes before your scheduled appointment time.   If you have a lab appointment with the [place] please come in thru the  Main Entrance and check in at the main information desk  You need to re-schedule your appointment should you arrive 10 or more minutes late.  We strive to give you quality time with our providers, and arriving late affects you and other patients whose appointments are after yours.  Also, if you no show three or more times for appointments you may be dismissed from the clinic at the providers discretion.     Again, thank you for choosing [facility].  Our hope is that these requests will decrease the amount of time that you wait before being seen by our physicians.       _____________________________________________________________  Should you have questions after your visit to [facility], please contact our office at [phone] between the hours of 8:00 a.m. and 4:30 p.m.  Voicemails left after 4:00 p.m. will not be returned until the following business day.  For prescription refill requests, have your pharmacy contact our office and allow 72 hours.    Cancer Center Support Programs:   > Cancer Support Group  2nd Tuesday of the month 1pm-2pm, Journey Room

## 2018-07-24 NOTE — Patient Instructions (Signed)
[facility] at [facility] Discharge Instructions  Labs drawn peripherally today   Thank you for choosing [facility] at [facility] to provide your oncology and hematology care.  To afford each patient quality time with our provider, please arrive at least 15 minutes before your scheduled appointment time.   If you have a lab appointment with the Cancer Center please come in thru the  Main Entrance and check in at the main information desk  You need to re-schedule your appointment should you arrive 10 or more minutes late.  We strive to give you quality time with our providers, and arriving late affects you and other patients whose appointments are after yours.  Also, if you no show three or more times for appointments you may be dismissed from the clinic at the providers discretion.     Again, thank you for choosing [facility].  Our hope is that these requests will decrease the amount of time that you wait before being seen by our physicians.       _____________________________________________________________  Should you have questions after your visit to [facility], please contact our office at [phone] between the hours of 8:00 a.m. and 4:30 p.m.  Voicemails left after 4:00 p.m. will not be returned until the following business day.  For prescription refill requests, have your pharmacy contact our office and allow 72 hours.    Cancer Center Support Programs:   > Cancer Support Group  2nd Tuesday of the month 1pm-2pm, Journey Room   

## 2018-07-24 NOTE — Progress Notes (Signed)
Instant message sent from [facility] LPN. Pt good for treatment. Labs pending at this time. VS within parameters for tx.   Labs reviewed within parameters for treatment.   Treatment given today per MD orders. Tolerated infusion without adverse affects. Vital signs stable. No complaints at this time. 5FU pump infusing and RUN noted on the pump screen. Discharged from clinic ambulatory. F/U with [facility] as scheduled.

## 2018-07-26 ENCOUNTER — Inpatient Hospital Stay (HOSPITAL_COMMUNITY): Payer: Medicaid Other

## 2018-07-26 ENCOUNTER — Other Ambulatory Visit: Payer: Self-pay

## 2018-07-26 VITALS — BP 108/69 | HR 92 | Temp 98.8°F | Resp 18

## 2018-07-26 DIAGNOSIS — C184 Malignant neoplasm of transverse colon: Secondary | ICD-10-CM

## 2018-07-26 DIAGNOSIS — C188 Malignant neoplasm of overlapping sites of colon: Secondary | ICD-10-CM

## 2018-07-26 DIAGNOSIS — Z5111 Encounter for antineoplastic chemotherapy: Secondary | ICD-10-CM | POA: Diagnosis not present

## 2018-07-26 MED ORDER — HEPARIN SOD (PORK) LOCK FLUSH 100 UNIT/ML IV SOLN
500.0000 [IU] | Freq: Once | INTRAVENOUS | Status: AC | PRN
  Administered 2018-07-26: 500 [IU]

## 2018-07-26 MED ORDER — SODIUM CHLORIDE 0.9% FLUSH
10.0000 mL | INTRAVENOUS | Status: DC | PRN
  Administered 2018-07-26: 10 mL
  Filled 2018-07-26: qty 10

## 2018-07-26 NOTE — Progress Notes (Signed)
Erik [name] returns today for port de access and flush after 46 hr continous infusion of 5fu. Tolerated infusion without problems. Portacath located left chest wall was  deaccessed and flushed with 61ml NS and 500U/23ml Heparin and needle removed intact.  Procedure without incident. Patient tolerated procedure well.

## 2018-08-07 NOTE — Progress Notes (Signed)
Virtual Visit via Telephone Note The purpose of this virtual visit is to provide medical care while limiting exposure to the novel coronavirus.    Consent was obtained for phone visit:  Yes Answered questions that patient had about telehealth interaction:  Yes I discussed the limitations, risks, security and privacy concerns of performing an evaluation and management service by telephone. I also discussed with the patient that there may be a patient responsible charge related to this service. The patient expressed understanding and agreed to proceed.  Pt location: Home Physician Location: office Name of referring provider:  No ref. provider found I connected with .Erik [name] at patients initiation/request on 08/08/2018 at 10:30 AM EDT by telephone and verified that I am speaking with the correct person using two identifiers.  Pt MRN:  [id number] Pt DOB:  01/04/1956   History of Present Illness:  Erik [name] is a 63 year old man with history of HTN, hyperlipidemia, adenocarcinoma of the colon,  PTSD, alcohol abuse, depression andhistory of SDH who presents for follow-up regarding tension type headache.   UPDATE: Patient last seen in November 2018.  He takes amitriptyline 25mg  at bedtime.  Headaches are controlled.  It is moderate and occurs once every 2 weeks.  His cancer returned and he had to restart chemotherapy.  Still with neuropathy in the feet (numbness but no pain) but improved in fingers.  HISTORY: In 2014, he was on the porch with a friend. He stood up and felt lightheaded, and then passed out. When he woke up, the paramedics were there. He did not have any convulsions, tongue laceration or incontinence. He elected not to go to the hospital. Later that night, he began to feel shaky with cold sweat. He presented a couple of days later to an outside ED, where CT of brain revealed small amount of subarachnoid blood at the high left parietal lobe, as well as small  3 mm adjacent subdural hematoma without mass effect. There was also a non-displaced left parietal skull fracture. He was subsequently transferred to [facility]. Serum etoh level was 217. He was evaluated by neurosurgery. Follow up CT scan was stable, so no surgical intervention was indicated. EEG from 08/27/12 was normal. 2D Echo revealed EF 55-60% with no significant abnormalities. Black outs were thought to be secondary to postural hypotension, rather than seizure.   He has periodic dizzy spells of lightheadedness and feeling of hot flashes or chills. Sometimes they are associated with passing out for a few seconds. He has no convulsions, tongue biting, urinary incontinence or postictal confusion. It usually occurs soon after getting up. He has fluctuation in blood pressure. At one point, he was orthostatic but he sometimes has elevated blood pressure. He had a cardiac monitor which was unremarkable.   He has unsteady gait and frequent falls.  He underwent chemotherapy, 12 rounds of FOLFOX from 03/30/15 to 09/06/15.  During that time, he developed increased numbness in the hands and feet.  He was started on gabapentin for discomfort and neuropathic pain.  He has a cane but still frequently falls.  His last fall was yesterday.  In January, his B12 level was in the 140s, so he was started on B12 injections.  He has history of alcohol abuse but only moderately drinks from time to time.    Observations/Objective:   Blood pressure 110/80, temperature (!) 96 F (35.6 C), temperature source Oral, height 6\' 2"  (1.88 m), weight 230 lb (104.3 kg). No acute distress.  Alert and oriented.  Speech fluent and not dysarthric.  Language intact.    Assessment and Plan:   1.  Tension-type headache, not intractable 2.  Chemotherapy-induced peripheral neuropathy 3.  Tobacco use  1.  For preventative management, amitriptyline 25mg  at bedtime refilled 2.  Limit use of pain relievers to no more than  2 days out of week to prevent risk of rebound or medication-overuse headache. 3.  Keep headache diary 4.  Exercise, hydration, caffeine cessation, sleep hygiene, monitor for and avoid triggers 5.  Consider:  magnesium citrate 400mg  daily, riboflavin 400mg  daily, and coenzyme Q10 100mg  three times daily 6. Smoking cessation 7. Follow up one year    Follow Up Instructions:    -I discussed the assessment and treatment plan with the patient. The patient was provided an opportunity to ask questions and all were answered. The patient agreed with the plan and demonstrated an understanding of the instructions.   The patient was advised to call back or seek an in-person evaluation if the symptoms worsen or if the condition fails to improve as anticipated.    Total Time spent in visit with the patient was:  12 minutes   Dudley Major, DO

## 2018-08-08 ENCOUNTER — Other Ambulatory Visit: Payer: Self-pay

## 2018-08-08 ENCOUNTER — Telehealth (INDEPENDENT_AMBULATORY_CARE_PROVIDER_SITE_OTHER): Payer: Medicaid Other | Admitting: Neurology

## 2018-08-08 ENCOUNTER — Encounter: Payer: Self-pay | Admitting: Neurology

## 2018-08-08 VITALS — BP 110/80 | Temp 96.0°F | Ht 74.0 in | Wt 230.0 lb

## 2018-08-08 DIAGNOSIS — G44219 Episodic tension-type headache, not intractable: Secondary | ICD-10-CM | POA: Diagnosis not present

## 2018-08-08 DIAGNOSIS — F17219 Nicotine dependence, cigarettes, with unspecified nicotine-induced disorders: Secondary | ICD-10-CM

## 2018-08-08 DIAGNOSIS — G620 Drug-induced polyneuropathy: Secondary | ICD-10-CM

## 2018-08-08 DIAGNOSIS — C184 Malignant neoplasm of transverse colon: Secondary | ICD-10-CM

## 2018-08-08 MED ORDER — AMITRIPTYLINE HCL 25 MG PO TABS: 25.0000 mg | ORAL_TABLET | Freq: Every day | ORAL | 3 refills | Status: DC

## 2018-08-13 ENCOUNTER — Encounter (HOSPITAL_COMMUNITY): Payer: Self-pay | Admitting: Hematology

## 2018-08-13 ENCOUNTER — Inpatient Hospital Stay (HOSPITAL_COMMUNITY): Payer: Medicaid Other

## 2018-08-13 ENCOUNTER — Other Ambulatory Visit: Payer: Self-pay

## 2018-08-13 ENCOUNTER — Inpatient Hospital Stay (HOSPITAL_COMMUNITY): Payer: Medicaid Other | Attending: Hematology

## 2018-08-13 ENCOUNTER — Inpatient Hospital Stay (HOSPITAL_BASED_OUTPATIENT_CLINIC_OR_DEPARTMENT_OTHER): Payer: Medicaid Other | Admitting: Hematology

## 2018-08-13 VITALS — BP 126/81 | HR 102 | Temp 97.5°F | Resp 18

## 2018-08-13 VITALS — BP 113/82 | HR 110 | Temp 97.9°F | Resp 18 | Wt 226.0 lb

## 2018-08-13 DIAGNOSIS — Z87891 Personal history of nicotine dependence: Secondary | ICD-10-CM

## 2018-08-13 DIAGNOSIS — Z806 Family history of leukemia: Secondary | ICD-10-CM

## 2018-08-13 DIAGNOSIS — C188 Malignant neoplasm of overlapping sites of colon: Secondary | ICD-10-CM

## 2018-08-13 DIAGNOSIS — Z5112 Encounter for antineoplastic immunotherapy: Secondary | ICD-10-CM | POA: Insufficient documentation

## 2018-08-13 DIAGNOSIS — C189 Malignant neoplasm of colon, unspecified: Secondary | ICD-10-CM

## 2018-08-13 DIAGNOSIS — C184 Malignant neoplasm of transverse colon: Secondary | ICD-10-CM

## 2018-08-13 DIAGNOSIS — Z79899 Other long term (current) drug therapy: Secondary | ICD-10-CM

## 2018-08-13 DIAGNOSIS — I2699 Other pulmonary embolism without acute cor pulmonale: Secondary | ICD-10-CM | POA: Diagnosis not present

## 2018-08-13 DIAGNOSIS — Z5111 Encounter for antineoplastic chemotherapy: Secondary | ICD-10-CM | POA: Diagnosis not present

## 2018-08-13 DIAGNOSIS — C786 Secondary malignant neoplasm of retroperitoneum and peritoneum: Secondary | ICD-10-CM | POA: Diagnosis not present

## 2018-08-13 DIAGNOSIS — R197 Diarrhea, unspecified: Secondary | ICD-10-CM

## 2018-08-13 DIAGNOSIS — G620 Drug-induced polyneuropathy: Secondary | ICD-10-CM

## 2018-08-13 DIAGNOSIS — Z803 Family history of malignant neoplasm of breast: Secondary | ICD-10-CM

## 2018-08-13 DIAGNOSIS — Z801 Family history of malignant neoplasm of trachea, bronchus and lung: Secondary | ICD-10-CM

## 2018-08-13 LAB — COMPREHENSIVE METABOLIC PANEL
ALT: 42 U/L (ref 0–44)
AST: 80 U/L — ABNORMAL HIGH (ref 15–41)
Albumin: 3.6 g/dL (ref 3.5–5.0)
Alkaline Phosphatase: 144 U/L — ABNORMAL HIGH (ref 38–126)
Anion gap: 12 (ref 5–15)
BUN: 5 mg/dL — ABNORMAL LOW (ref 6–20)
CO2: 26 mmol/L (ref 22–32)
Calcium: 8.8 mg/dL — ABNORMAL LOW (ref 8.9–10.3)
Chloride: 98 mmol/L (ref 98–111)
Creatinine, Ser: 0.82 mg/dL (ref 0.61–1.24)
GFR calc Af Amer: >60 mL/min (ref >60)
GFR calc non Af Amer: >60 mL/min (ref >60)
Glucose, Bld: 116 mg/dL — ABNORMAL HIGH (ref 70–99)
Potassium: 3.4 mmol/L — ABNORMAL LOW (ref 3.5–5.1)
Sodium: 136 mmol/L (ref 135–145)
Total Bilirubin: 1 mg/dL (ref 0.3–1.2)
Total Protein: 6.5 g/dL (ref 6.5–8.1)

## 2018-08-13 LAB — CBC WITH DIFFERENTIAL/PLATELET
Abs Immature Granulocytes: 0.03 10*3/uL (ref 0.00–0.07)
Basophils Absolute: 0 10*3/uL (ref 0.0–0.1)
Basophils Relative: 1 %
Eosinophils Absolute: 0.1 10*3/uL (ref 0.0–0.5)
Eosinophils Relative: 3 %
HCT: 42.4 % (ref 39.0–52.0)
Hemoglobin: 14 g/dL (ref 13.0–17.0)
Immature Granulocytes: 1 %
Lymphocytes Relative: 21 %
Lymphs Abs: 0.9 10*3/uL (ref 0.7–4.0)
MCH: 32 pg (ref 26.0–34.0)
MCHC: 33 g/dL (ref 30.0–36.0)
MCV: 96.8 fL (ref 80.0–100.0)
Monocytes Absolute: 0.6 10*3/uL (ref 0.1–1.0)
Monocytes Relative: 13 %
Neutro Abs: 2.7 10*3/uL (ref 1.7–7.7)
Neutrophils Relative %: 61 %
Platelets: 126 10*3/uL — ABNORMAL LOW (ref 150–400)
RBC: 4.38 MIL/uL (ref 4.22–5.81)
RDW: 15.2 % (ref 11.5–15.5)
WBC: 4.3 10*3/uL (ref 4.0–10.5)
nRBC: 0 % (ref 0.0–0.2)

## 2018-08-13 MED ORDER — SODIUM CHLORIDE 0.9 % IV SOLN
2400.0000 mg/m2 | INTRAVENOUS | Status: DC
  Administered 2018-08-13: 5650 mg via INTRAVENOUS
  Filled 2018-08-13: qty 113

## 2018-08-13 MED ORDER — SODIUM CHLORIDE 0.9% FLUSH
10.0000 mL | INTRAVENOUS | Status: DC | PRN
  Administered 2018-08-13: 10 mL
  Filled 2018-08-13: qty 10

## 2018-08-13 MED ORDER — SODIUM CHLORIDE 0.9 % IV SOLN
4.8000 mg/kg | Freq: Once | INTRAVENOUS | Status: AC
  Administered 2018-08-13: 500 mg via INTRAVENOUS
  Filled 2018-08-13: qty 16

## 2018-08-13 MED ORDER — SODIUM CHLORIDE 0.9 % IV SOLN
Freq: Once | INTRAVENOUS | Status: AC
  Administered 2018-08-13: 10:00:00 via INTRAVENOUS

## 2018-08-13 MED ORDER — PALONOSETRON HCL INJECTION 0.25 MG/5ML
0.2500 mg | Freq: Once | INTRAVENOUS | Status: AC
  Administered 2018-08-13: 0.25 mg via INTRAVENOUS
  Filled 2018-08-13: qty 5

## 2018-08-13 MED ORDER — SODIUM CHLORIDE 0.9 % IV SOLN
10.0000 mg | Freq: Once | INTRAVENOUS | Status: AC
  Administered 2018-08-13: 10 mg via INTRAVENOUS
  Filled 2018-08-13: qty 10

## 2018-08-13 MED ORDER — ATROPINE SULFATE 1 MG/ML IJ SOLN
0.5000 mg | Freq: Once | INTRAMUSCULAR | Status: AC
  Administered 2018-08-13: 0.5 mg via INTRAVENOUS
  Filled 2018-08-13: qty 1

## 2018-08-13 MED ORDER — FLUOROURACIL CHEMO INJECTION 2.5 GM/50ML
400.0000 mg/m2 | Freq: Once | INTRAVENOUS | Status: AC
  Administered 2018-08-13: 950 mg via INTRAVENOUS
  Filled 2018-08-13: qty 19

## 2018-08-13 MED ORDER — SODIUM CHLORIDE 0.9 % IV SOLN
900.0000 mg | Freq: Once | INTRAVENOUS | Status: AC
  Administered 2018-08-13: 900 mg via INTRAVENOUS
  Filled 2018-08-13: qty 45

## 2018-08-13 NOTE — Progress Notes (Signed)
Erik [name], [place] [address]   CLINIC:  Medical Oncology/Hematology  PCP:  Patient, No Pcp Per No address on file None   REASON FOR VISIT:  Follow-up for metastatic colon cancer   BRIEF ONCOLOGIC HISTORY:  Oncology History Overview Note  Stage IIIC ([id number]) adenocarcinoma of transverse colon, diagnosed on colonoscopy by Dr. Oneida Alar on 01/12/2015 followed by definitive surgery by Dr. [name] with right hemicolectomy on 02/12/2015.  He then underwent FOLFOX x 12 cycles in the adjuvant setting (03/30/2015- 09/06/2015).   Malignant neoplasm of transverse colon ([place])  01/12/2015 Pathologic Stage   Colon, biopsy, distal transverse - TUBULOVILLOUS ADENOMA WITH HIGH GRADE DYSPLASIA.   01/12/2015 Procedure   Colonoscopy by Dr. Oneida Alar.   01/12/2015 Tumor Marker   CEA: 6.6 (H)    01/18/2015 Imaging   CT abd/pelvis- Apple-core lesion identified in the mid transverse colon without obstruction. No evidence for lymphadenopathy in the gastrohepatic ligament or omentum.  Stable 8 mm hypo attenuating lesion in the left liver, likely a cyst.   02/10/2015 Initial Diagnosis   Adenocarcinoma of transverse colon ([place])   02/12/2015 Definitive Surgery   [name], Extended right hemicolectomy    02/12/2015 Pathology Results   Mucinous adenocarcinoma with penetration of visceral peritoneum, 4/19 lymph nodes for metastatic disease, negative resection margins, with LVI and perineural invasion   03/30/2015 - 09/06/2015 Chemotherapy   FOLFOX x 12 cycles   05/25/2015 Treatment Plan Change   5 FU bolus discontinued for cycle #5   06/08/2015 Treatment Plan Change   Treatment deferred x 1 week   06/15/2015 Treatment Plan Change   5FU CI decreased by 10% and Oxaliplatin reduced by 15% for cycles #6-#11; Oxaliplatin dropped for cycle #12 d/t neuropathy.    10/20/2015 Imaging   CT CAP- Right hemicolectomy without evidence of metastatic disease. 2. Previously  measured ground-glass lesion in the left upper lobe has resolved. 3. Probable food debris in the stomach, simulating gastric wall thickening. Please correlate clinically. 4. 6 mm irregular nodular density in the left upper lobe, stable. Continued attention on followup exams is warranted.   01/25/2016 Procedure   Colonoscopy by Dr. Oneida Alar- Non-thrombosed external hemorrhoids found on digital rectal exam. - One 4 mm polyp in the rectum, removed with a cold biopsy forceps. Resected and retrieved. - Congested mucosa in the neo-terminal ileum. Biopsied. - Redundant colon. - Internal hemorrhoids.   01/26/2016 Pathology Results   1. Terminal ileum, biopsy - MILD ACUTE (ACTIVE) ILEITIS. - NO DYSPLASIA OR MALIGNANCY IDENTIFIED - SEE COMMENT. 2. Rectum, polyp(s) - HYPERPLASTIC POLYP (X 1). - NO DYSPLASIA OR MALIGNANCY IDENTIFIED.   04/13/2016 Imaging   CT chest- Stable CT chest. 6 mm irregular nodule anterior left upper lobe is stable. The scattered areas of peribronchovascular micro nodularity in the lungs bilaterally are unchanged.   10/19/2016 Imaging   CT CAP: 1. Status post right hemicolectomy. No findings to suggest metastatic disease in the abdomen or pelvis. 2. Aortic atherosclerosis. 3. Additional incidental findings, as above. Aortic Atherosclerosis (ICD10-I70.0).   01/09/2018 -  Chemotherapy   The patient had palonosetron (ALOXI) injection 0.25 mg, 0.25 mg, Intravenous,  Once, 15 of 17 cycles Administration: 0.25 mg (01/09/2018), 0.25 mg (01/23/2018), 0.25 mg (02/11/2018), 0.25 mg (02/25/2018), 0.25 mg (03/12/2018), 0.25 mg (03/27/2018), 0.25 mg (04/10/2018), 0.25 mg (04/24/2018), 0.25 mg (05/08/2018), 0.25 mg (05/22/2018), 0.25 mg (06/10/2018), 0.25 mg (06/26/2018), 0.25 mg (07/10/2018), 0.25 mg (07/24/2018), 0.25 mg (08/13/2018) irinotecan (CAMPTOSAR) 420 mg in dextrose  5 % 500 mL chemo infusion, 180 mg/m2 = 420 mg, Intravenous,  Once, 9 of 9 cycles Administration: 420 mg (01/09/2018), 420 mg  (01/23/2018), 420 mg (02/11/2018), 420 mg (02/25/2018), 420 mg (03/12/2018), 420 mg (03/27/2018), 420 mg (04/10/2018), 420 mg (04/24/2018), 420 mg (05/08/2018) leucovorin 900 mg in dextrose 5 % 250 mL infusion, 944 mg, Intravenous,  Once, 15 of 17 cycles Administration: 900 mg (01/09/2018), 900 mg (01/23/2018), 900 mg (02/11/2018), 900 mg (02/25/2018), 900 mg (03/12/2018), 900 mg (03/27/2018), 900 mg (04/10/2018), 900 mg (04/24/2018), 900 mg (05/08/2018), 900 mg (06/10/2018), 900 mg (06/26/2018), 900 mg (07/10/2018), 900 mg (07/24/2018), 900 mg (08/13/2018) fluorouracil (ADRUCIL) chemo injection 950 mg, 400 mg/m2 = 950 mg, Intravenous,  Once, 15 of 17 cycles Administration: 950 mg (01/09/2018), 950 mg (01/23/2018), 950 mg (02/11/2018), 950 mg (02/25/2018), 950 mg (03/12/2018), 950 mg (03/27/2018), 950 mg (04/10/2018), 950 mg (04/24/2018), 950 mg (05/08/2018), 950 mg (05/22/2018), 950 mg (06/10/2018), 950 mg (06/26/2018), 950 mg (07/10/2018), 950 mg (07/24/2018), 950 mg (08/13/2018) fluorouracil (ADRUCIL) 5,650 mg in sodium chloride 0.9 % 137 mL chemo infusion, 2,400 mg/m2 = 5,650 mg, Intravenous, 1 Day/Dose, 15 of 17 cycles Administration: 5,650 mg (01/09/2018), 5,650 mg (01/23/2018), 5,650 mg (02/11/2018), 5,650 mg (02/25/2018), 5,650 mg (03/12/2018), 5,650 mg (03/27/2018), 5,650 mg (04/10/2018), 5,650 mg (04/24/2018), 5,650 mg (05/08/2018), 5,650 mg (05/22/2018), 5,650 mg (06/10/2018), 5,650 mg (06/26/2018), 5,650 mg (07/10/2018), 5,650 mg (07/24/2018), 5,650 mg (08/13/2018)  for chemotherapy treatment.    01/23/2018 Cancer Staging   Staging form: Colon and Rectum, AJCC 7th Edition - Pathologic: M1 - Signed by Zoila Shutter, MD on 01/23/2018   Malignant neoplasm of overlapping sites of colon ([place])  12/26/2017 Initial Diagnosis   Cancer of overlapping sites of colon metastatic to intra-abdominal lymph node ([place])   01/09/2018 -  Chemotherapy   The patient had palonosetron (ALOXI) injection 0.25 mg, 0.25 mg, Intravenous,  Once, 13 of 14 cycles Administration: 0.25 mg  (01/09/2018), 0.25 mg (01/23/2018), 0.25 mg (02/11/2018), 0.25 mg (02/25/2018), 0.25 mg (03/12/2018), 0.25 mg (03/27/2018), 0.25 mg (04/10/2018), 0.25 mg (04/24/2018), 0.25 mg (05/08/2018), 0.25 mg (05/22/2018), 0.25 mg (06/10/2018), 0.25 mg (06/26/2018), 0.25 mg (07/10/2018) irinotecan (CAMPTOSAR) 420 mg in dextrose 5 % 500 mL chemo infusion, 180 mg/m2 = 420 mg, Intravenous,  Once, 9 of 9 cycles Administration: 420 mg (01/09/2018), 420 mg (01/23/2018), 420 mg (02/11/2018), 420 mg (02/25/2018), 420 mg (03/12/2018), 420 mg (03/27/2018), 420 mg (04/10/2018), 420 mg (04/24/2018), 420 mg (05/08/2018) leucovorin 900 mg in dextrose 5 % 250 mL infusion, 944 mg, Intravenous,  Once, 13 of 14 cycles Administration: 900 mg (01/09/2018), 900 mg (01/23/2018), 900 mg (02/11/2018), 900 mg (02/25/2018), 900 mg (03/12/2018), 900 mg (03/27/2018), 900 mg (04/10/2018), 900 mg (04/24/2018), 900 mg (05/08/2018), 900 mg (06/10/2018), 900 mg (06/26/2018), 900 mg (07/10/2018) fluorouracil (ADRUCIL) chemo injection 950 mg, 400 mg/m2 = 950 mg, Intravenous,  Once, 13 of 14 cycles Administration: 950 mg (01/09/2018), 950 mg (01/23/2018), 950 mg (02/11/2018), 950 mg (02/25/2018), 950 mg (03/12/2018), 950 mg (03/27/2018), 950 mg (04/10/2018), 950 mg (04/24/2018), 950 mg (05/08/2018), 950 mg (05/22/2018), 950 mg (06/10/2018), 950 mg (06/26/2018), 950 mg (07/10/2018) fluorouracil (ADRUCIL) 5,650 mg in sodium chloride 0.9 % 137 mL chemo infusion, 2,400 mg/m2 = 5,650 mg, Intravenous, 1 Day/Dose, 13 of 14 cycles Administration: 5,650 mg (01/09/2018), 5,650 mg (01/23/2018), 5,650 mg (02/11/2018), 5,650 mg (02/25/2018), 5,650 mg (03/12/2018), 5,650 mg (03/27/2018), 5,650 mg (04/10/2018), 5,650 mg (04/24/2018), 5,650 mg (05/08/2018), 5,650 mg (05/22/2018), 5,650 mg (06/10/2018), 5,650 mg (06/26/2018), 5,650 mg (07/10/2018)  for chemotherapy treatment.    Colon cancer metastasized to multiple sites [facility])  01/16/2018 Initial Diagnosis   Colon cancer metastasized to multiple sites [facility])   01/23/2018 - 02/10/2018 Chemotherapy     The patient had panitumumab (VECTIBIX) 600 mg in sodium chloride 0.9 % 100 mL chemo infusion, 640 mg, Intravenous,  Once, 1 of 3 cycles Administration: 600 mg (01/23/2018)  for chemotherapy treatment.    03/12/2018 -  Chemotherapy   The patient had bevacizumab (AVASTIN) 500 mg in sodium chloride 0.9 % 100 mL chemo infusion, 525 mg, Intravenous,  Once, 11 of 14 cycles Administration: 500 mg (03/12/2018), 500 mg (03/27/2018), 500 mg (04/10/2018), 500 mg (04/24/2018), 500 mg (06/10/2018), 500 mg (05/08/2018), 500 mg (05/22/2018), 500 mg (06/26/2018), 500 mg (07/10/2018), 500 mg (07/24/2018), 500 mg (08/13/2018)  for chemotherapy treatment.       CANCER STAGING: Cancer Staging Malignant neoplasm of transverse colon [facility]) Staging form: Colon and Rectum, AJCC 7th Edition - Pathologic stage from 03/04/2015: Stage IIIC (T4a, N2a, cM0) - Signed by Baird Cancer, PA-C on 03/04/2015 - Pathologic: M1 - Signed by Zoila Shutter, MD on 01/23/2018    INTERVAL HISTORY:  Mr. Theisen 63 y.o. male here for follow-up of his chemotherapy.  He had some diarrhea after last cycle.  Also had some nausea which was controlled well.  Had fallen once since last visit.  Appetite is 50%.  Energy levels are low.  Denies any bleeding per rectum or melena.  Denies any nosebleeds.  Blood pressure is adequately controlled.  Denies any ER visits or hospitalizations.  Shortness of breath on exertion is also stable.   REVIEW OF SYSTEMS:  Review of Systems  Constitutional: Positive for fatigue.  Gastrointestinal: Positive for diarrhea and nausea.  Neurological: Positive for numbness.  All other systems reviewed and are negative.    PAST MEDICAL/SURGICAL HISTORY:  Past Medical History:  Diagnosis Date   Abnormal stress echocardiogram    Adenocarcinoma of transverse colon ([place]) 02/10/2015   Alcohol abuse    Heavy Use up until 2010   Anxiety    Blood transfusion without reported diagnosis    Cancer of overlapping sites of colon  metastatic to intra-abdominal lymph node ([place]) 12/26/2017   COPD (chronic obstructive pulmonary disease) (H[facility])    Depression    Emphysema of lung (H[facility])    GERD (gastroesophageal reflux disease)    Head trauma 2001   closed head injury; coma for 4 weeks   Hypercholesterolemia    Hypertension    ING HERN W/GANGREN RECUR UNILAT/UNSPEC ING HERN 04/21/2009   Qualifier: Diagnosis of  By: Verl Blalock, MD, [place] [facility]    PTSD (post-traumatic stress disorder)    Pulmonary nodule 06/14/2016   SAH (subarachnoid hemorrhage) ([place]) 08/31/2012   SDH (subdural hematoma) ([place]) 08/31/2012   Past Surgical History:  Procedure Laterality Date   BIOPSY  01/25/2016   Procedure: BIOPSY;  Surgeon: Danie Binder, MD;  [facility];  Service: Endoscopy;;  ileum;    cardiac cath     COLONOSCOPY  2011   Dr. Oneida Alar: multiple adenomas and hyperplastic polyps   COLONOSCOPY WITH PROPOFOL N/A 01/12/2015   Procedure: COLONOSCOPY WITH PROPOFOL;  Surgeon: Danie Binder, MD;  Location: [facility];  Service: Endoscopy;  Laterality: N/A;  1030   COLONOSCOPY WITH PROPOFOL N/A 01/25/2016   Procedure: COLONOSCOPY WITH PROPOFOL;  Surgeon: Danie Binder, MD;  [facility]: [facility];  Service: Endoscopy;  Laterality: N/A;  1230   CRANIOTOMY  2001   ESOPHAGOGASTRODUODENOSCOPY (EGD) WITH PROPOFOL N/A 01/12/2015   Procedure: ESOPHAGOGASTRODUODENOSCOPY (EGD) WITH PROPOFOL;  Surgeon: Danie Binder, MD;  [facility];  Service: Endoscopy;  Laterality: N/A;   head injury surgery     HERNIA REPAIR Right 2012   Inguinal- [facility]   KIDNEY SURGERY     >30 years ago   LAPAROSCOPIC RIGHT HEMI COLECTOMY Left 02/10/2015   Procedure: LAPAROSCOPIC THEN OPEN RIGHT HEMI COLECTOMY;  Surgeon: [name], MD;  Location: [facility] ORS;  Service: General;  Laterality: Left;   LAPAROTOMY N/A 12/19/2017   Procedure: EXPLORATORY LAPAROTOMY;  Surgeon: Aviva Signs, MD;  Location: [facility];  Service: General;   Laterality: N/A;   POLYPECTOMY  01/25/2016   Procedure: POLYPECTOMY;  Surgeon: Danie Binder, MD;  [facility];  Service: Endoscopy;;  colon   PORT-A-CATH REMOVAL N/A 02/26/2017   Procedure: MINOR REMOVAL PORT-A-CATH;  Surgeon: Aviva Signs, MD;  Location: [facility];  Service: General;  Laterality: N/A;  Pt to arrive at [place] N/A 03/24/2015   Procedure: Circle;  Surgeon: Jules Husbands, MD;  Location: [facility] ORS;  Service: General;  Laterality: N/A;   PORTACATH PLACEMENT Left 01/07/2018   Procedure: INSERTION PORT A CATH (ATTACHED CATHETER IN LEFT SUBCLAVIAN);  Surgeon: Aviva Signs, MD;  Location: [facility];  Service: General;  Laterality: Left;     SOCIAL HISTORY:  Social History   Socioeconomic History   Marital status: Divorced    Spouse name: Not on file   Number of children: 2   Years of education: 12   Highest education level: Not on file  Occupational History   Occupation: disabled/unemployed   Occupation: unemployed/Mediicaid only    Comment: NO INCOME  Social Designer, fashion/clothing strain: Very hard   Food insecurity    Worry: Often true    Inability: Often true   Transportation needs    Medical: Yes    Non-medical: No  Tobacco Use   Smoking status: Former Smoker    Packs/day: 0.10    Years: 30.00    Pack years: 3.00    Types: Cigarettes    Start date: 02/06/1973    Quit date: 12/09/2017    Years since quitting: 0.6   Smokeless tobacco: Never Used   Tobacco comment: Quit on 12/09/2017  Substance and Sexual Activity   Alcohol use: Yes    Alcohol/week: 0.0 standard drinks    Comment: beer occ, history of ETOH abuse in remote past.    Drug use: No   Sexual activity: Never    Partners: Female    Birth control/protection: None  Lifestyle   Physical activity    Days per week: Not on file    Minutes per session: Not on file   Stress: Not on file  Relationships   Social connections    Talks on  phone: Not on file    Gets together: Not on file    Attends religious service: Not on file    Active member of club or organization: Not on file    Attends meetings of clubs or organizations: Not on file    Relationship status: Not on file   Intimate partner violence    Fear of current or ex partner: Not on file    Emotionally abused: Not on file    Physically abused: Not on file    Forced sexual activity: Not on file  Other Topics Concern   Not  on file  Social History Narrative   Single   Lives alone   Turned down for disability    FAMILY HISTORY:  Family History  Problem Relation Age of Onset   Pulmonary embolism Mother    Breast cancer Mother    Cancer Mother        breast cancer   Arthritis Mother    Heart disease Father    Cancer Father 42       Leukemia   Cancer Paternal Grandfather        Lung   Ataxia Neg Hx    Chorea Neg Hx    Dementia Neg Hx    Mental retardation Neg Hx    Migraines Neg Hx    Multiple sclerosis Neg Hx    Neurofibromatosis Neg Hx    Neuropathy Neg Hx    Parkinsonism Neg Hx    Seizures Neg Hx    Stroke Neg Hx    Colon cancer Neg Hx     CURRENT MEDICATIONS:  Outpatient Encounter Medications as of 08/13/2018  Medication Sig Note   amitriptyline (ELAVIL) 25 MG tablet Take 1 tablet (25 mg total) by mouth at bedtime.    atorvastatin (LIPITOR) 20 MG tablet TAKE 1 TABLET BY MOUTH AT BEDTIME    buPROPion (WELLBUTRIN SR) 150 MG 12 hr tablet Take 150 mg by mouth every morning.     busPIRone (BUSPAR) 10 MG tablet Take 10 mg by mouth 2 (two) times daily.    fluorouracil CALGB 16579 in sodium chloride 0.9 % 150 mL Inject 5,650 mg into the vein. Over 46 hours    FLUoxetine (PROZAC) 20 MG capsule TAKE 1 CAPSULE BY MOUTH 2 TIMES A DAY (Patient taking differently: Take 20 mg by mouth 2 (two) times daily. ) 06/10/2018: 1 tab TID   gabapentin (NEURONTIN) 300 MG capsule TAKE 1 CAPSULE BY MOUTH THREE TIMES A DAY    IRINOTECAN HCL IV  Inject 420 mg into the vein every 14 (fourteen) days.    LEUCOVORIN CALCIUM IV Inject 944 mg into the vein every 14 (fourteen) days.    lidocaine-prilocaine (EMLA) cream APPLY A SMALL AMOUNT OVER PORT SITE AND COVER WITH PLASTIC WRAP ONE HOUR PRIOR TO APPOINTMENT    loperamide (IMODIUM) 2 MG capsule 2 tablets twice daily    omeprazole (PRILOSEC) 20 MG capsule Take 20 mg by mouth every morning.     ondansetron (ZOFRAN) 8 MG tablet Take 1 tablet (8 mg total) by mouth 2 (two) times daily as needed for refractory nausea / vomiting. Start on day 3 after chemotherapy.    PROAIR HFA 108 (90 Base) MCG/ACT inhaler INHALE 2 PUFFS BY MOUTH EVERY 6 HOURS AS NEEDED FOR SHORTNESS OF BREATH/WHEEZING.    prochlorperazine (COMPAZINE) 10 MG tablet Take 1 tablet (10 mg total) by mouth every 6 (six) hours as needed (NAUSEA).    rivaroxaban (XARELTO) 20 MG TABS tablet Take 1 tablet (20 mg total) by mouth daily with supper.    No facility-administered encounter medications on file as of 08/13/2018.     ALLERGIES:  No Known Allergies   PHYSICAL EXAM:  ECOG Performance status: 1  Vitals:   08/13/18 0805  BP: 113/82  Pulse: (!) 110  Resp: 18  Temp: 97.9 F (36.6 C)  SpO2: 97%   Filed Weights   08/13/18 0805  Weight: 226 lb (102.5 kg)    Physical Exam Vitals signs reviewed.  Constitutional:      Appearance: Normal appearance.  Cardiovascular:  Rate and Rhythm: Normal rate and regular rhythm.     Heart sounds: Normal heart sounds.  Pulmonary:     Effort: Pulmonary effort is normal.     Breath sounds: Normal breath sounds.  Abdominal:     General: There is no distension.     Palpations: Abdomen is soft. There is no mass.  Musculoskeletal:        General: No swelling.  Skin:    General: Skin is warm.  Neurological:     General: No focal deficit present.     Mental Status: He is alert and oriented to person, place, and time.  Psychiatric:        Mood and Affect: Mood normal.         Behavior: Behavior normal.      LABORATORY DATA:  I have reviewed the labs as listed.  CBC    Component Value Date/Time   WBC 4.3 08/13/2018 0829   RBC 4.38 08/13/2018 0829   HGB 14.0 08/13/2018 0829   HCT 42.4 08/13/2018 0829   PLT 126 (L) 08/13/2018 0829   MCV 96.8 08/13/2018 0829   MCH 32.0 08/13/2018 0829   MCHC 33.0 08/13/2018 0829   RDW 15.2 08/13/2018 0829   LYMPHSABS 0.9 08/13/2018 0829   MONOABS 0.6 08/13/2018 0829   EOSABS 0.1 08/13/2018 0829   BASOSABS 0.0 08/13/2018 0829   CMP Latest Ref Rng & Units 08/13/2018 07/24/2018 07/10/2018  Glucose 70 - 99 mg/dL 116(H) 97 108(H)  BUN 6 - 20 mg/dL 5(L) 10 8  Creatinine 0.61 - 1.24 mg/dL 0.82 0.74 0.93  Sodium 135 - 145 mmol/L 136 138 139  Potassium 3.5 - 5.1 mmol/L 3.4(L) 4.7 5.1  Chloride 98 - 111 mmol/L 98 100 99  CO2 22 - 32 mmol/L '26 25 27  ' Calcium 8.9 - 10.3 mg/dL 8.8(L) 9.1 9.0  Total Protein 6.5 - 8.1 g/dL 6.5 6.7 6.9  Total Bilirubin 0.3 - 1.2 mg/dL 1.0 0.5 0.6  Alkaline Phos 38 - 126 U/L 144(H) 91 95  AST 15 - 41 U/L 80(H) 23 26  ALT 0 - 44 U/L 42 18 16       DIAGNOSTIC IMAGING:  I have independently reviewed the scans and discussed with the patient.   I have reviewed Venita Lick LPN's note and agree with the documentation.  I personally performed a face-to-face visit, made revisions and my assessment and plan is as follows.    ASSESSMENT & PLAN:   Malignant neoplasm of transverse colon ([place]) 1.  Metastatic colon cancer: -Foundation 1 CDX shows MS-stable, K-ras Q 61L,TP53 S215I, NRAS wild type, APC T991f*16, FAM123B [id number] - Status post FOLFOX for 12 cycles in the adjuvant setting from 03/30/2015 through 09/06/2015. -PET CT scan on 11/19/2017 showing peritoneal carcinomatosis, right paratracheal lymph node with mild FDG uptake with mild nonspecific increased uptake in the right hilar region.  CEA was elevated at 12.9 on 11/16/2017. - 9 cycles of FOLFIRI from 01/09/2018 through 05/08/2018 with addition of  bevacizumab during cycle 5.  (After having severe rash with vectibix during cycle 2)  - CT CAP from 05/20/2018 shows minimal improvement in peritoneal metastasis.  Presumed peritoneal implant within the small bowel mesentery measures 1.5 x 1.1 cm.  A pelvic cul-de-sac implant measures 1.9 x 1.7 cm compared to 2.2 x 1.5 cm prior. -Maintenance 5-FU/leucovorin and bevacizumab started on 05/22/2018. - He has fallen 1-2 times since last visit.  He has history of falls from his prior stroke. - He had  mild diarrhea which was fairly well controlled. -We have reviewed his blood work.  We will proceed with maintenance 5-FU/leucovorin and bevacizumab today. -I will see him back in 2 weeks for follow-up.  I plan to repeat CT scans prior to next visit.  We will also check a CEA level.   2.  Pulmonary embolism: -Incidental finding on CT chest done on 05/20/2018.  CT angiogram on same day showed bilateral pulmonary emboli. -He is continuing to take Xarelto 20 mg daily without any bleeding issues.    3.  Peripheral neuropathy: -He has pre-existing neuropathy from prior oxaliplatin.  Numbness in the legs has been stable.  4.  Diarrhea: -Chemotherapy induced. -He is currently taking Lomotil 2 tablets twice daily. -I have told him to increase it to 3 times a day as needed.   Total time spent is 25 minutes with more than 50% of the time spent face-to-face discussing treatment plan and coordination of care.    Orders placed this encounter:  Orders Placed This Encounter  Procedures   CT Abdomen Pelvis W Contrast   CT Chest W Contrast   CEA   CBC with Differential/Platelet   Comprehensive metabolic panel      Derek Jack, MD [place] [phone]

## 2018-08-13 NOTE — Progress Notes (Signed)
Erik [name] tolerated treatment without incident or complaint. VSS. 5FU infusing through port via home infusion pump without difficulties. Dressing reinforced. Discharged self ambulatory in satisfactory condition.

## 2018-08-13 NOTE — Assessment & Plan Note (Addendum)
1.  Metastatic colon cancer: -Foundation 1 CDX shows MS-stable, K-ras Q 61L,TP53 S215I, NRAS wild type, APC T971f*16, FAM123B [phone] - Status post FOLFOX for 12 cycles in the adjuvant setting from 03/30/2015 through 09/06/2015. -PET CT scan on 11/19/2017 showing peritoneal carcinomatosis, right paratracheal lymph node with mild FDG uptake with mild nonspecific increased uptake in the right hilar region.  CEA was elevated at 12.9 on 11/16/2017. - 9 cycles of FOLFIRI from 01/09/2018 through 05/08/2018 with addition of bevacizumab during cycle 5.  (After having severe rash with vectibix during cycle 2)  - CT CAP from 05/20/2018 shows minimal improvement in peritoneal metastasis.  Presumed peritoneal implant within the small bowel mesentery measures 1.5 x 1.1 cm.  A pelvic cul-de-sac implant measures 1.9 x 1.7 cm compared to 2.2 x 1.5 cm prior. -Maintenance 5-FU/leucovorin and bevacizumab started on 05/22/2018. - He has fallen 1-2 times since last visit.  He has history of falls from his prior stroke. - He had mild diarrhea which was fairly well controlled. -We have reviewed his blood work.  We will proceed with maintenance 5-FU/leucovorin and bevacizumab today. -I will see him back in 2 weeks for follow-up.  I plan to repeat CT scans prior to next visit.  We will also check a CEA level.   2.  Pulmonary embolism: -Incidental finding on CT chest done on 05/20/2018.  CT angiogram on same day showed bilateral pulmonary emboli. -He is continuing to take Xarelto 20 mg daily without any bleeding issues.    3.  Peripheral neuropathy: -He has pre-existing neuropathy from prior oxaliplatin.  Numbness in the legs has been stable.  4.  Diarrhea: -Chemotherapy induced. -He is currently taking Lomotil 2 tablets twice daily. -I have told him to increase it to 3 times a day as needed.

## 2018-08-13 NOTE — Patient Instructions (Addendum)
[facility] at [facility] Discharge Instructions  You were seen today by Dr. Katragadda. He went over your recent lab results. He will see you back in 2 weeks for labs and follow up.   Thank you for choosing [facility] at [facility] to provide your oncology and hematology care.  To afford each patient quality time with our provider, please arrive at least 15 minutes before your scheduled appointment time.   If you have a lab appointment with the Cancer Center please come in thru the  Main Entrance and check in at the main information desk  You need to re-schedule your appointment should you arrive 10 or more minutes late.  We strive to give you quality time with our providers, and arriving late affects you and other patients whose appointments are after yours.  Also, if you no show three or more times for appointments you may be dismissed from the clinic at the providers discretion.     Again, thank you for choosing [facility].  Our hope is that these requests will decrease the amount of time that you wait before being seen by our physicians.       _____________________________________________________________  Should you have questions after your visit to  Cancer Center, please contact our office at [phone] between the hours of 8:00 a.m. and 4:30 p.m.  Voicemails left after 4:00 p.m. will not be returned until the following business day.  For prescription refill requests, have your pharmacy contact our office and allow 72 hours.    Cancer Center Support Programs:   > Cancer Support Group  2nd Tuesday of the month 1pm-2pm, Journey Room    

## 2018-08-14 LAB — CEA: CEA: 10.4 ng/mL — ABNORMAL HIGH (ref 0.0–4.7)

## 2018-08-15 ENCOUNTER — Other Ambulatory Visit: Payer: Self-pay

## 2018-08-15 ENCOUNTER — Encounter (HOSPITAL_COMMUNITY): Payer: Self-pay

## 2018-08-15 ENCOUNTER — Inpatient Hospital Stay (HOSPITAL_COMMUNITY): Payer: Medicaid Other

## 2018-08-15 VITALS — BP 137/74 | HR 104 | Temp 97.5°F | Resp 18

## 2018-08-15 DIAGNOSIS — Z5111 Encounter for antineoplastic chemotherapy: Secondary | ICD-10-CM | POA: Diagnosis not present

## 2018-08-15 DIAGNOSIS — C184 Malignant neoplasm of transverse colon: Secondary | ICD-10-CM

## 2018-08-15 DIAGNOSIS — C188 Malignant neoplasm of overlapping sites of colon: Secondary | ICD-10-CM

## 2018-08-15 MED ORDER — SODIUM CHLORIDE 0.9% FLUSH
10.0000 mL | INTRAVENOUS | Status: DC | PRN
  Administered 2018-08-15: 10 mL
  Filled 2018-08-15: qty 10

## 2018-08-15 MED ORDER — HEPARIN SOD (PORK) LOCK FLUSH 100 UNIT/ML IV SOLN
500.0000 [IU] | Freq: Once | INTRAVENOUS | Status: AC | PRN
  Administered 2018-08-15: 13:00:00 500 [IU]

## 2018-08-26 ENCOUNTER — Ambulatory Visit (HOSPITAL_COMMUNITY)
Admission: RE | Admit: 2018-08-26 | Discharge: 2018-08-26 | Disposition: A | Discharge status: Home / Self Care | Payer: Medicaid Other | Source: Ambulatory Visit | Attending: Hematology | Admitting: Hematology

## 2018-08-26 ENCOUNTER — Other Ambulatory Visit: Payer: Self-pay

## 2018-08-26 DIAGNOSIS — C188 Malignant neoplasm of overlapping sites of colon: Secondary | ICD-10-CM | POA: Diagnosis not present

## 2018-08-26 MED ORDER — IOHEXOL 300 MG/ML  SOLN
100.0000 mL | Freq: Once | INTRAMUSCULAR | Status: AC | PRN
  Administered 2018-08-26: 100 mL via INTRAVENOUS

## 2018-08-27 ENCOUNTER — Inpatient Hospital Stay (HOSPITAL_BASED_OUTPATIENT_CLINIC_OR_DEPARTMENT_OTHER): Payer: Medicaid Other | Admitting: Hematology

## 2018-08-27 ENCOUNTER — Encounter (HOSPITAL_COMMUNITY): Payer: Self-pay

## 2018-08-27 ENCOUNTER — Encounter (HOSPITAL_COMMUNITY): Payer: Self-pay | Admitting: Hematology

## 2018-08-27 ENCOUNTER — Inpatient Hospital Stay (HOSPITAL_COMMUNITY): Payer: Medicaid Other

## 2018-08-27 VITALS — BP 137/74 | HR 90 | Temp 96.7°F | Resp 18

## 2018-08-27 DIAGNOSIS — Z803 Family history of malignant neoplasm of breast: Secondary | ICD-10-CM

## 2018-08-27 DIAGNOSIS — I2699 Other pulmonary embolism without acute cor pulmonale: Secondary | ICD-10-CM

## 2018-08-27 DIAGNOSIS — Z7901 Long term (current) use of anticoagulants: Secondary | ICD-10-CM

## 2018-08-27 DIAGNOSIS — C786 Secondary malignant neoplasm of retroperitoneum and peritoneum: Secondary | ICD-10-CM

## 2018-08-27 DIAGNOSIS — C184 Malignant neoplasm of transverse colon: Secondary | ICD-10-CM

## 2018-08-27 DIAGNOSIS — C188 Malignant neoplasm of overlapping sites of colon: Secondary | ICD-10-CM

## 2018-08-27 DIAGNOSIS — G620 Drug-induced polyneuropathy: Secondary | ICD-10-CM

## 2018-08-27 DIAGNOSIS — R197 Diarrhea, unspecified: Secondary | ICD-10-CM

## 2018-08-27 DIAGNOSIS — Z79899 Other long term (current) drug therapy: Secondary | ICD-10-CM

## 2018-08-27 DIAGNOSIS — Z801 Family history of malignant neoplasm of trachea, bronchus and lung: Secondary | ICD-10-CM

## 2018-08-27 DIAGNOSIS — Z5111 Encounter for antineoplastic chemotherapy: Secondary | ICD-10-CM | POA: Diagnosis not present

## 2018-08-27 DIAGNOSIS — Z806 Family history of leukemia: Secondary | ICD-10-CM

## 2018-08-27 DIAGNOSIS — C189 Malignant neoplasm of colon, unspecified: Secondary | ICD-10-CM

## 2018-08-27 DIAGNOSIS — Z87891 Personal history of nicotine dependence: Secondary | ICD-10-CM

## 2018-08-27 LAB — CBC WITH DIFFERENTIAL/PLATELET
Abs Immature Granulocytes: 0.02 10*3/uL (ref 0.00–0.07)
Basophils Absolute: 0 10*3/uL (ref 0.0–0.1)
Basophils Relative: 1 %
Eosinophils Absolute: 0.1 10*3/uL (ref 0.0–0.5)
Eosinophils Relative: 1 %
HCT: 44.9 % (ref 39.0–52.0)
Hemoglobin: 14.4 g/dL (ref 13.0–17.0)
Immature Granulocytes: 0 %
Lymphocytes Relative: 16 %
Lymphs Abs: 0.9 10*3/uL (ref 0.7–4.0)
MCH: 32.4 pg (ref 26.0–34.0)
MCHC: 32.1 g/dL (ref 30.0–36.0)
MCV: 100.9 fL — ABNORMAL HIGH (ref 80.0–100.0)
Monocytes Absolute: 0.5 10*3/uL (ref 0.1–1.0)
Monocytes Relative: 9 %
Neutro Abs: 4.1 10*3/uL (ref 1.7–7.7)
Neutrophils Relative %: 73 %
Platelets: 143 10*3/uL — ABNORMAL LOW (ref 150–400)
RBC: 4.45 MIL/uL (ref 4.22–5.81)
RDW: 16.6 % — ABNORMAL HIGH (ref 11.5–15.5)
WBC: 5.7 10*3/uL (ref 4.0–10.5)
nRBC: 0 % (ref 0.0–0.2)

## 2018-08-27 LAB — COMPREHENSIVE METABOLIC PANEL
ALT: 16 U/L (ref 0–44)
AST: 19 U/L (ref 15–41)
Albumin: 3.5 g/dL (ref 3.5–5.0)
Alkaline Phosphatase: 95 U/L (ref 38–126)
Anion gap: 8 (ref 5–15)
BUN: 5 mg/dL — ABNORMAL LOW (ref 6–20)
CO2: 29 mmol/L (ref 22–32)
Calcium: 9.2 mg/dL (ref 8.9–10.3)
Chloride: 98 mmol/L (ref 98–111)
Creatinine, Ser: 0.92 mg/dL (ref 0.61–1.24)
GFR calc Af Amer: >60 mL/min (ref >60)
GFR calc non Af Amer: >60 mL/min (ref >60)
Glucose, Bld: 107 mg/dL — ABNORMAL HIGH (ref 70–99)
Potassium: 4.6 mmol/L (ref 3.5–5.1)
Sodium: 135 mmol/L (ref 135–145)
Total Bilirubin: 0.6 mg/dL (ref 0.3–1.2)
Total Protein: 6.8 g/dL (ref 6.5–8.1)

## 2018-08-27 LAB — URINALYSIS, DIPSTICK ONLY
Bilirubin Urine: NEGATIVE
Glucose, UA: NEGATIVE mg/dL
Hgb urine dipstick: NEGATIVE
Ketones, ur: NEGATIVE mg/dL
Leukocytes,Ua: NEGATIVE
Nitrite: NEGATIVE
Protein, ur: NEGATIVE mg/dL
Specific Gravity, Urine: 1.02 (ref 1.005–1.030)
pH: 5 (ref 5.0–8.0)

## 2018-08-27 LAB — URIC ACID: Uric Acid, Serum: 6.6 mg/dL (ref 3.7–8.6)

## 2018-08-27 MED ORDER — STERILE WATER FOR INJECTION IJ SOLN
INTRAMUSCULAR | Status: AC
  Filled 2018-08-27: qty 10

## 2018-08-27 MED ORDER — ATROPINE SULFATE 1 MG/ML IJ SOLN
0.5000 mg | Freq: Once | INTRAMUSCULAR | Status: AC
  Administered 2018-08-27: 10:00:00 0.5 mg via INTRAVENOUS

## 2018-08-27 MED ORDER — SODIUM CHLORIDE 0.9% FLUSH
10.0000 mL | INTRAVENOUS | Status: DC | PRN
  Administered 2018-08-27: 09:00:00 10 mL
  Filled 2018-08-27: qty 10

## 2018-08-27 MED ORDER — PALONOSETRON HCL INJECTION 0.25 MG/5ML
0.2500 mg | Freq: Once | INTRAVENOUS | Status: AC
  Administered 2018-08-27: 0.25 mg via INTRAVENOUS

## 2018-08-27 MED ORDER — ALTEPLASE 2 MG IJ SOLR
2.0000 mg | Freq: Once | INTRAMUSCULAR | Status: AC | PRN
  Administered 2018-08-27: 09:00:00 2 mg

## 2018-08-27 MED ORDER — SODIUM CHLORIDE 0.9 % IV SOLN
2400.0000 mg/m2 | INTRAVENOUS | Status: DC
  Administered 2018-08-27: 12:00:00 5650 mg via INTRAVENOUS
  Filled 2018-08-27: qty 113

## 2018-08-27 MED ORDER — ALTEPLASE 2 MG IJ SOLR
INTRAMUSCULAR | Status: AC
  Filled 2018-08-27: qty 2

## 2018-08-27 MED ORDER — SODIUM CHLORIDE 0.9 % IV SOLN
10.0000 mg | Freq: Once | INTRAVENOUS | Status: AC
  Administered 2018-08-27: 10:00:00 10 mg via INTRAVENOUS
  Filled 2018-08-27: qty 10

## 2018-08-27 MED ORDER — ATROPINE SULFATE 1 MG/ML IJ SOLN
INTRAMUSCULAR | Status: AC
  Filled 2018-08-27: qty 1

## 2018-08-27 MED ORDER — SODIUM CHLORIDE 0.9 % IV SOLN
4.8000 mg/kg | Freq: Once | INTRAVENOUS | Status: AC
  Administered 2018-08-27: 11:00:00 500 mg via INTRAVENOUS
  Filled 2018-08-27: qty 16

## 2018-08-27 MED ORDER — FLUOROURACIL CHEMO INJECTION 2.5 GM/50ML
400.0000 mg/m2 | Freq: Once | INTRAVENOUS | Status: AC
  Administered 2018-08-27: 12:00:00 950 mg via INTRAVENOUS
  Filled 2018-08-27: qty 19

## 2018-08-27 MED ORDER — PALONOSETRON HCL INJECTION 0.25 MG/5ML
INTRAVENOUS | Status: AC
  Filled 2018-08-27: qty 5

## 2018-08-27 MED ORDER — SODIUM CHLORIDE 0.9 % IV SOLN
Freq: Once | INTRAVENOUS | Status: AC
  Administered 2018-08-27: 10:00:00 via INTRAVENOUS

## 2018-08-27 MED ORDER — SODIUM CHLORIDE 0.9 % IV SOLN
900.0000 mg | Freq: Once | INTRAVENOUS | Status: AC
  Administered 2018-08-27: 900 mg via INTRAVENOUS
  Filled 2018-08-27: qty 45

## 2018-08-27 NOTE — Progress Notes (Signed)
[place] [place], [place] [address]   CLINIC:  Medical Oncology/Hematology  PCP:  Patient, No Pcp Per No address on file None   REASON FOR VISIT:  Follow-up for metastatic colon cancer   BRIEF ONCOLOGIC HISTORY:  Oncology History Overview Note  Stage IIIC ([id number]) adenocarcinoma of transverse colon, diagnosed on colonoscopy by Dr. Oneida Alar on 01/12/2015 followed by definitive surgery by Dr. [name] with right hemicolectomy on 02/12/2015.  He then underwent FOLFOX x 12 cycles in the adjuvant setting (03/30/2015- 09/06/2015).   Malignant neoplasm of transverse colon ([place])  01/12/2015 Pathologic Stage   Colon, biopsy, distal transverse - TUBULOVILLOUS ADENOMA WITH HIGH GRADE DYSPLASIA.   01/12/2015 Procedure   Colonoscopy by Dr. Oneida Alar.   01/12/2015 Tumor Marker   CEA: 6.6 (H)    01/18/2015 Imaging   CT abd/pelvis- Apple-core lesion identified in the mid transverse colon without obstruction. No evidence for lymphadenopathy in the gastrohepatic ligament or omentum.  Stable 8 mm hypo attenuating lesion in the left liver, likely a cyst.   02/10/2015 Initial Diagnosis   Adenocarcinoma of transverse colon ([place])   02/12/2015 Definitive Surgery   [name], Extended right hemicolectomy    02/12/2015 Pathology Results   Mucinous adenocarcinoma with penetration of visceral peritoneum, 4/19 lymph nodes for metastatic disease, negative resection margins, with LVI and perineural invasion   03/30/2015 - 09/06/2015 Chemotherapy   FOLFOX x 12 cycles   05/25/2015 Treatment Plan Change   5 FU bolus discontinued for cycle #5   06/08/2015 Treatment Plan Change   Treatment deferred x 1 week   06/15/2015 Treatment Plan Change   5FU CI decreased by 10% and Oxaliplatin reduced by 15% for cycles #6-#11; Oxaliplatin dropped for cycle #12 d/t neuropathy.    10/20/2015 Imaging   CT CAP- Right hemicolectomy without evidence of metastatic disease. 2. Previously  measured ground-glass lesion in the left upper lobe has resolved. 3. Probable food debris in the stomach, simulating gastric wall thickening. Please correlate clinically. 4. 6 mm irregular nodular density in the left upper lobe, stable. Continued attention on followup exams is warranted.   01/25/2016 Procedure   Colonoscopy by Dr. Oneida Alar- Non-thrombosed external hemorrhoids found on digital rectal exam. - One 4 mm polyp in the rectum, removed with a cold biopsy forceps. Resected and retrieved. - Congested mucosa in the neo-terminal ileum. Biopsied. - Redundant colon. - Internal hemorrhoids.   01/26/2016 Pathology Results   1. Terminal ileum, biopsy - MILD ACUTE (ACTIVE) ILEITIS. - NO DYSPLASIA OR MALIGNANCY IDENTIFIED - SEE COMMENT. 2. Rectum, polyp(s) - HYPERPLASTIC POLYP (X 1). - NO DYSPLASIA OR MALIGNANCY IDENTIFIED.   04/13/2016 Imaging   CT chest- Stable CT chest. 6 mm irregular nodule anterior left upper lobe is stable. The scattered areas of peribronchovascular micro nodularity in the lungs bilaterally are unchanged.   10/19/2016 Imaging   CT CAP: 1. Status post right hemicolectomy. No findings to suggest metastatic disease in the abdomen or pelvis. 2. Aortic atherosclerosis. 3. Additional incidental findings, as above. Aortic Atherosclerosis (ICD10-I70.0).   01/09/2018 -  Chemotherapy   The patient had palonosetron (ALOXI) injection 0.25 mg, 0.25 mg, Intravenous,  Once, 16 of 23 cycles Administration: 0.25 mg (01/09/2018), 0.25 mg (01/23/2018), 0.25 mg (02/11/2018), 0.25 mg (02/25/2018), 0.25 mg (03/12/2018), 0.25 mg (03/27/2018), 0.25 mg (04/10/2018), 0.25 mg (04/24/2018), 0.25 mg (05/08/2018), 0.25 mg (05/22/2018), 0.25 mg (06/10/2018), 0.25 mg (06/26/2018), 0.25 mg (07/10/2018), 0.25 mg (07/24/2018), 0.25 mg (08/13/2018), 0.25 mg (08/27/2018) irinotecan (CAMPTOSAR) 420  mg in dextrose 5 % 500 mL chemo infusion, 180 mg/m2 = 420 mg, Intravenous,  Once, 9 of 9 cycles Administration: 420 mg  (01/09/2018), 420 mg (01/23/2018), 420 mg (02/11/2018), 420 mg (02/25/2018), 420 mg (03/12/2018), 420 mg (03/27/2018), 420 mg (04/10/2018), 420 mg (04/24/2018), 420 mg (05/08/2018) leucovorin 900 mg in dextrose 5 % 250 mL infusion, 944 mg, Intravenous,  Once, 16 of 23 cycles Administration: 900 mg (01/09/2018), 900 mg (01/23/2018), 900 mg (02/11/2018), 900 mg (02/25/2018), 900 mg (03/12/2018), 900 mg (03/27/2018), 900 mg (04/10/2018), 900 mg (04/24/2018), 900 mg (05/08/2018), 900 mg (06/10/2018), 900 mg (06/26/2018), 900 mg (07/10/2018), 900 mg (07/24/2018), 900 mg (08/13/2018), 900 mg (08/27/2018) fluorouracil (ADRUCIL) chemo injection 950 mg, 400 mg/m2 = 950 mg, Intravenous,  Once, 16 of 23 cycles Administration: 950 mg (01/09/2018), 950 mg (01/23/2018), 950 mg (02/11/2018), 950 mg (02/25/2018), 950 mg (03/12/2018), 950 mg (03/27/2018), 950 mg (04/10/2018), 950 mg (04/24/2018), 950 mg (05/08/2018), 950 mg (05/22/2018), 950 mg (06/10/2018), 950 mg (06/26/2018), 950 mg (07/10/2018), 950 mg (07/24/2018), 950 mg (08/13/2018), 950 mg (08/27/2018) fluorouracil (ADRUCIL) 5,650 mg in sodium chloride 0.9 % 137 mL chemo infusion, 2,400 mg/m2 = 5,650 mg, Intravenous, 1 Day/Dose, 16 of 23 cycles Administration: 5,650 mg (01/09/2018), 5,650 mg (01/23/2018), 5,650 mg (02/11/2018), 5,650 mg (02/25/2018), 5,650 mg (03/12/2018), 5,650 mg (03/27/2018), 5,650 mg (04/10/2018), 5,650 mg (04/24/2018), 5,650 mg (05/08/2018), 5,650 mg (05/22/2018), 5,650 mg (06/10/2018), 5,650 mg (06/26/2018), 5,650 mg (07/10/2018), 5,650 mg (07/24/2018), 5,650 mg (08/13/2018), 5,650 mg (08/27/2018)  for chemotherapy treatment.    01/23/2018 Cancer Staging   Staging form: Colon and Rectum, AJCC 7th Edition - Pathologic: M1 - Signed by Zoila Shutter, MD on 01/23/2018   Malignant neoplasm of overlapping sites of colon ([place])  12/26/2017 Initial Diagnosis   Cancer of overlapping sites of colon metastatic to intra-abdominal lymph node ([place])   01/09/2018 -  Chemotherapy   The patient had palonosetron (ALOXI) injection  0.25 mg, 0.25 mg, Intravenous,  Once, 13 of 14 cycles Administration: 0.25 mg (01/09/2018), 0.25 mg (01/23/2018), 0.25 mg (02/11/2018), 0.25 mg (02/25/2018), 0.25 mg (03/12/2018), 0.25 mg (03/27/2018), 0.25 mg (04/10/2018), 0.25 mg (04/24/2018), 0.25 mg (05/08/2018), 0.25 mg (05/22/2018), 0.25 mg (06/10/2018), 0.25 mg (06/26/2018), 0.25 mg (07/10/2018) irinotecan (CAMPTOSAR) 420 mg in dextrose 5 % 500 mL chemo infusion, 180 mg/m2 = 420 mg, Intravenous,  Once, 9 of 9 cycles Administration: 420 mg (01/09/2018), 420 mg (01/23/2018), 420 mg (02/11/2018), 420 mg (02/25/2018), 420 mg (03/12/2018), 420 mg (03/27/2018), 420 mg (04/10/2018), 420 mg (04/24/2018), 420 mg (05/08/2018) leucovorin 900 mg in dextrose 5 % 250 mL infusion, 944 mg, Intravenous,  Once, 13 of 14 cycles Administration: 900 mg (01/09/2018), 900 mg (01/23/2018), 900 mg (02/11/2018), 900 mg (02/25/2018), 900 mg (03/12/2018), 900 mg (03/27/2018), 900 mg (04/10/2018), 900 mg (04/24/2018), 900 mg (05/08/2018), 900 mg (06/10/2018), 900 mg (06/26/2018), 900 mg (07/10/2018) fluorouracil (ADRUCIL) chemo injection 950 mg, 400 mg/m2 = 950 mg, Intravenous,  Once, 13 of 14 cycles Administration: 950 mg (01/09/2018), 950 mg (01/23/2018), 950 mg (02/11/2018), 950 mg (02/25/2018), 950 mg (03/12/2018), 950 mg (03/27/2018), 950 mg (04/10/2018), 950 mg (04/24/2018), 950 mg (05/08/2018), 950 mg (05/22/2018), 950 mg (06/10/2018), 950 mg (06/26/2018), 950 mg (07/10/2018) fluorouracil (ADRUCIL) 5,650 mg in sodium chloride 0.9 % 137 mL chemo infusion, 2,400 mg/m2 = 5,650 mg, Intravenous, 1 Day/Dose, 13 of 14 cycles Administration: 5,650 mg (01/09/2018), 5,650 mg (01/23/2018), 5,650 mg (02/11/2018), 5,650 mg (02/25/2018), 5,650 mg (03/12/2018), 5,650 mg (03/27/2018), 5,650 mg (04/10/2018), 5,650 mg (04/24/2018), 5,650 mg (05/08/2018),  5,650 mg (05/22/2018), 5,650 mg (06/10/2018), 5,650 mg (06/26/2018), 5,650 mg (07/10/2018)  for chemotherapy treatment.    Colon cancer metastasized to multiple sites [facility])  01/16/2018 Initial Diagnosis   Colon  cancer metastasized to multiple sites [facility])   01/23/2018 - 02/10/2018 Chemotherapy   The patient had panitumumab (VECTIBIX) 600 mg in sodium chloride 0.9 % 100 mL chemo infusion, 640 mg, Intravenous,  Once, 1 of 3 cycles Administration: 600 mg (01/23/2018)  for chemotherapy treatment.    03/12/2018 -  Chemotherapy   The patient had bevacizumab (AVASTIN) 500 mg in sodium chloride 0.9 % 100 mL chemo infusion, 525 mg, Intravenous,  Once, 12 of 14 cycles Administration: 500 mg (03/12/2018), 500 mg (03/27/2018), 500 mg (04/10/2018), 500 mg (04/24/2018), 500 mg (06/10/2018), 500 mg (05/08/2018), 500 mg (05/22/2018), 500 mg (06/26/2018), 500 mg (07/10/2018), 500 mg (07/24/2018), 500 mg (08/13/2018), 500 mg (08/27/2018)  for chemotherapy treatment.       CANCER STAGING: Cancer Staging Malignant neoplasm of transverse colon [facility]) Staging form: Colon and Rectum, AJCC 7th Edition - Pathologic stage from 03/04/2015: Stage IIIC (T4a, N2a, cM0) - Signed by Baird Cancer, PA-C on 03/04/2015 - Pathologic: M1 - Signed by Zoila Shutter, MD on 01/23/2018    INTERVAL HISTORY:  Mr. Geise 63 y.o. male here for follow-up and half scans and chemotherapy.  He had one episode of nosebleed which was spontaneous.  Appetite is 75%.  Energy levels are 50%.  Numbness in the feet has been stable.  Denies any nausea vomiting or constipation.  Diarrhea is under control.  Denies any fevers or night sweats.  No ER visits or hospitalizations.  No abdominal cramping reported.   REVIEW OF SYSTEMS:  Review of Systems  HENT:   Positive for nosebleeds.   Neurological: Positive for numbness.  All other systems reviewed and are negative.    PAST MEDICAL/SURGICAL HISTORY:  Past Medical History:  Diagnosis Date   Abnormal stress echocardiogram    Adenocarcinoma of transverse colon ([place]) 02/10/2015   Alcohol abuse    Heavy Use up until 2010   Anxiety    Blood transfusion without reported diagnosis    Cancer of overlapping sites of  colon metastatic to intra-abdominal lymph node ([place]) 12/26/2017   COPD (chronic obstructive pulmonary disease) (H[facility])    Depression    Emphysema of lung (H[facility])    GERD (gastroesophageal reflux disease)    Head trauma 2001   closed head injury; coma for 4 weeks   Hypercholesterolemia    Hypertension    ING HERN W/GANGREN RECUR UNILAT/UNSPEC ING HERN 04/21/2009   Qualifier: Diagnosis of  By: Verl Blalock, MD, [place] [facility]    PTSD (post-traumatic stress disorder)    Pulmonary nodule 06/14/2016   SAH (subarachnoid hemorrhage) ([place]) 08/31/2012   SDH (subdural hematoma) ([place]) 08/31/2012   Past Surgical History:  Procedure Laterality Date   BIOPSY  01/25/2016   Procedure: BIOPSY;  Surgeon: Danie Binder, MD;  [facility];  Service: Endoscopy;;  ileum;    cardiac cath     COLONOSCOPY  2011   Dr. Oneida Alar: multiple adenomas and hyperplastic polyps   COLONOSCOPY WITH PROPOFOL N/A 01/12/2015   Procedure: COLONOSCOPY WITH PROPOFOL;  Surgeon: Danie Binder, MD;  [facility];  Service: Endoscopy;  Laterality: N/A;  1030   COLONOSCOPY WITH PROPOFOL N/A 01/25/2016   Procedure: COLONOSCOPY WITH PROPOFOL;  Surgeon: Danie Binder, MD;  [facility];  Service: Endoscopy;  Laterality: N/A;  1230  CRANIOTOMY  2001   ESOPHAGOGASTRODUODENOSCOPY (EGD) WITH PROPOFOL N/A 01/12/2015   Procedure: ESOPHAGOGASTRODUODENOSCOPY (EGD) WITH PROPOFOL;  Surgeon: Danie Binder, MD;  [facility];  Service: Endoscopy;  Laterality: N/A;   head injury surgery     HERNIA REPAIR Right 2012   Inguinal- [facility]   KIDNEY SURGERY     >30 years ago   LAPAROSCOPIC RIGHT HEMI COLECTOMY Left 02/10/2015   Procedure: LAPAROSCOPIC THEN OPEN RIGHT HEMI COLECTOMY;  Surgeon: [name], MD;  Location: [facility] OR[facility];  Service: General;  Laterality: Left;   LAPAROTOMY N/A 12/19/2017   Procedure: EXPLORATORY LAPAROTOMY;  Surgeon: Aviva Signs, MD;  Location: [facility];  Service:  General;  Laterality: N/A;   POLYPECTOMY  01/25/2016   Procedure: POLYPECTOMY;  Surgeon: Danie Binder, MD;  [facility];  Service: Endoscopy;;  colon   PORT-A-CATH REMOVAL N/A 02/26/2017   Procedure: MINOR REMOVAL PORT-A-CATH;  Surgeon: Aviva Signs, MD;  Location: [facility];  Service: General;  Laterality: N/A;  Pt to arrive at [place] N/A 03/24/2015   Procedure: [place];  Surgeon: Jules Husbands, MD;  Location: [facility];  Service: General;  Laterality: N/A;   PORTACATH PLACEMENT Left 01/07/2018   Procedure: INSERTION PORT A CATH (ATTACHED CATHETER IN LEFT SUBCLAVIAN);  Surgeon: Aviva Signs, MD;  Location: [facility];  Service: General;  Laterality: Left;     SOCIAL HISTORY:  Social History   Socioeconomic History   Marital status: Divorced    Spouse name: Not on file   Number of children: 2   Years of education: 12   Highest education level: Not on file  Occupational History   Occupation: disabled/unemployed   Occupation: unemployed/Mediicaid only    Comment: NO INCOME  Social Designer, fashion/clothing strain: Very hard   Food insecurity    Worry: Often true    Inability: Often true   Transportation needs    Medical: Yes    Non-medical: No  Tobacco Use   Smoking status: Former Smoker    Packs/day: 0.10    Years: 30.00    Pack years: 3.00    Types: Cigarettes    Start date: 02/06/1973    Quit date: 12/09/2017    Years since quitting: 0.7   Smokeless tobacco: Never Used   Tobacco comment: Quit on 12/09/2017  Substance and Sexual Activity   Alcohol use: Yes    Alcohol/week: 0.0 standard drinks    Comment: beer occ, history of ETOH abuse in remote past.    Drug use: No   Sexual activity: Never    Partners: Female    Birth control/protection: None  Lifestyle   Physical activity    Days per week: Not on file    Minutes per session: Not on file   Stress: Not on file  Relationships   Social connections     Talks on phone: Not on file    Gets together: Not on file    Attends religious service: Not on file    Active member of club or organization: Not on file    Attends meetings of clubs or organizations: Not on file    Relationship status: Not on file   Intimate partner violence    Fear of current or ex partner: Not on file    Emotionally abused: Not on file    Physically abused: Not on file    Forced sexual activity: Not on file  Other Topics Concern  Not on file  Social History Narrative   Single   Lives alone   Turned down for disability    FAMILY HISTORY:  Family History  Problem Relation Age of Onset   Pulmonary embolism Mother    Breast cancer Mother    Cancer Mother        breast cancer   Arthritis Mother    Heart disease Father    Cancer Father 41       Leukemia   Cancer Paternal Grandfather        Lung   Ataxia Neg Hx    Chorea Neg Hx    Dementia Neg Hx    Mental retardation Neg Hx    Migraines Neg Hx    Multiple sclerosis Neg Hx    Neurofibromatosis Neg Hx    Neuropathy Neg Hx    Parkinsonism Neg Hx    Seizures Neg Hx    Stroke Neg Hx    Colon cancer Neg Hx     CURRENT MEDICATIONS:  Outpatient Encounter Medications as of 08/27/2018  Medication Sig Note   amitriptyline (ELAVIL) 25 MG tablet Take 1 tablet (25 mg total) by mouth at bedtime.    atorvastatin (LIPITOR) 20 MG tablet TAKE 1 TABLET BY MOUTH AT BEDTIME    buPROPion (WELLBUTRIN SR) 150 MG 12 hr tablet Take 150 mg by mouth every morning.     busPIRone (BUSPAR) 10 MG tablet Take 10 mg by mouth 2 (two) times daily.    fluorouracil CALGB 94854 in sodium chloride 0.9 % 150 mL Inject 5,650 mg into the vein. Over 46 hours    FLUoxetine (PROZAC) 20 MG capsule TAKE 1 CAPSULE BY MOUTH 2 TIMES A DAY (Patient taking differently: Take 20 mg by mouth 2 (two) times daily. ) 06/10/2018: 1 tab TID   gabapentin (NEURONTIN) 300 MG capsule TAKE 1 CAPSULE BY MOUTH THREE TIMES A DAY     IRINOTECAN HCL IV Inject 420 mg into the vein every 14 (fourteen) days.    LEUCOVORIN CALCIUM IV Inject 944 mg into the vein every 14 (fourteen) days.    lidocaine-prilocaine (EMLA) cream APPLY A SMALL AMOUNT OVER PORT SITE AND COVER WITH PLASTIC WRAP ONE HOUR PRIOR TO APPOINTMENT    loperamide (IMODIUM) 2 MG capsule 2 tablets twice daily    omeprazole (PRILOSEC) 20 MG capsule Take 20 mg by mouth every morning.     ondansetron (ZOFRAN) 8 MG tablet Take 1 tablet (8 mg total) by mouth 2 (two) times daily as needed for refractory nausea / vomiting. Start on day 3 after chemotherapy.    PROAIR HFA 108 (90 Base) MCG/ACT inhaler INHALE 2 PUFFS BY MOUTH EVERY 6 HOURS AS NEEDED FOR SHORTNESS OF BREATH/WHEEZING.    prochlorperazine (COMPAZINE) 10 MG tablet Take 1 tablet (10 mg total) by mouth every 6 (six) hours as needed (NAUSEA).    rivaroxaban (XARELTO) 20 MG TABS tablet Take 1 tablet (20 mg total) by mouth daily with supper.    [EXPIRED] 0.9 %  sodium chloride infusion     [EXPIRED] alteplase (CATHFLO ACTIVASE) injection 2 mg     [EXPIRED] atropine injection 0.5 mg     [EXPIRED] bevacizumab (AVASTIN) 500 mg in sodium chloride 0.9 % 100 mL chemo infusion     [EXPIRED] dexamethasone (DECADRON) 10 mg in sodium chloride 0.9 % 50 mL IVPB     [EXPIRED] fluorouracil (ADRUCIL) chemo injection 950 mg     [EXPIRED] leucovorin 900 mg in sodium chloride 0.9 % 250  mL infusion     [EXPIRED] palonosetron (ALOXI) injection 0.25 mg     [DISCONTINUED] fluorouracil (ADRUCIL) 5,650 mg in sodium chloride 0.9 % 137 mL chemo infusion     [DISCONTINUED] sodium chloride flush (NS) 0.9 % injection 10 mL     No facility-administered encounter medications on file as of 08/27/2018.     ALLERGIES:  No Known Allergies   PHYSICAL EXAM:  ECOG Performance status: 1  Vitals:   08/27/18 0801  BP: 126/73  Pulse: (!) 102  Resp: 18  Temp: (!) 97.5 F (36.4 C)  SpO2: 97%   Filed Weights   08/27/18 0801    Weight: 222 lb (100.7 kg)    Physical Exam Vitals signs reviewed.  Constitutional:      Appearance: Normal appearance.  Cardiovascular:     Rate and Rhythm: Normal rate and regular rhythm.     Heart sounds: Normal heart sounds.  Pulmonary:     Effort: Pulmonary effort is normal.     Breath sounds: Normal breath sounds.  Abdominal:     General: There is no distension.     Palpations: Abdomen is soft. There is no mass.  Musculoskeletal:        General: No swelling.  Skin:    General: Skin is warm.  Neurological:     General: No focal deficit present.     Mental Status: He is alert and oriented to person, place, and time.  Psychiatric:        Mood and Affect: Mood normal.        Behavior: Behavior normal.      LABORATORY DATA:  I have reviewed the labs as listed.  CBC    Component Value Date/Time   WBC 5.7 08/27/2018 0745   RBC 4.45 08/27/2018 0745   HGB 14.4 08/27/2018 0745   HCT 44.9 08/27/2018 0745   PLT 143 (L) 08/27/2018 0745   MCV 100.9 (H) 08/27/2018 0745   MCH 32.4 08/27/2018 0745   MCHC 32.1 08/27/2018 0745   RDW 16.6 (H) 08/27/2018 0745   LYMPHSABS 0.9 08/27/2018 0745   MONOABS 0.5 08/27/2018 0745   EOSABS 0.1 08/27/2018 0745   BASOSABS 0.0 08/27/2018 0745   CMP Latest Ref Rng & Units 08/27/2018 08/13/2018 07/24/2018  Glucose 70 - 99 mg/dL 107(H) 116(H) 97  BUN 6 - 20 mg/dL 5(L) 5(L) 10  Creatinine 0.61 - 1.24 mg/dL 0.92 0.82 0.74  Sodium 135 - 145 mmol/L 135 136 138  Potassium 3.5 - 5.1 mmol/L 4.6 3.4(L) 4.7  Chloride 98 - 111 mmol/L 98 98 100  CO2 22 - 32 mmol/L '29 26 25  ' Calcium 8.9 - 10.3 mg/dL 9.2 8.8(L) 9.1  Total Protein 6.5 - 8.1 g/dL 6.8 6.5 6.7  Total Bilirubin 0.3 - 1.2 mg/dL 0.6 1.0 0.5  Alkaline Phos 38 - 126 U/L 95 144(H) 91  AST 15 - 41 U/L 19 80(H) 23  ALT 0 - 44 U/L 16 42 18       DIAGNOSTIC IMAGING:  I have independently reviewed the scans and discussed with the patient.   I have reviewed Venita Lick LPN's note and agree  with the documentation.  I personally performed a face-to-face visit, made revisions and my assessment and plan is as follows.    ASSESSMENT & PLAN:   Malignant neoplasm of transverse colon ([place]) 1.  Metastatic colon cancer, K-ras mutation positive, MS-stable: - Status post FOLFOX for 12 cycles in the adjuvant setting from 03/30/2015 through 09/06/2015. - PET  scan on 11/19/2017 showing peritoneal carcinomatosis, right paratracheal lymph node, nonspecific increased uptake in the right hilar region.  CEA was 12.9 on 11/16/2017. -9 cycles of FOLFIRI from 01/09/2018 through 05/08/2018 with addition of bevacizumab during cycle 5. - Maintenance 5-FU/leucovorin and bevacizumab started on 05/22/2018. - CEA on 08/13/2018 increased to 10.4 from 5.1 (on 06/26/2018). - CT CAP on 08/26/2018 shows stable peritoneal metastasis with peritoneal implants and mesenteric lesions.  No new or progressive findings.  No hepatic metastatic disease.  Few scattered tiny lung nodules with no evidence of pulmonary mets. - We have reviewed his labs.  He will proceed with his next cycle.  He will be seen back in 2 weeks for follow-up.  2.  Pulmonary embolism: -Incidental PE on CT chest on 05/20/2018.  CT angiogram confirmed bilateral pulmonary emboli. - He is taking Xarelto 20 mg without any bleeding issues.  3.  Peripheral neuropathy: -He has pre-existing neuropathy from prior oxaliplatin.  Numbness in the legs has been stable.  4.  Diarrhea: - Chemotherapy-induced.  He will continue Lomotil 2 tablets 2-3 times daily.  Total time spent is 25 minutes with more than 50% of the time spent face-to-face discussing treatment plan and coordination of care.    Orders placed this encounter:  No orders of the defined types were placed in this encounter.     Derek Jack, MD [place] [phone]

## 2018-08-27 NOTE — Progress Notes (Signed)
Pt presents today for treatment, office visit and lab draw. MAR reviewed. Pt has complaints of slight nose bleeds and fatigue. No other changes since the last visit. Alteplase placed in port today. Labs drawn peripheral. HR 102 . Labs pending.   09:05. Blood return noted. Aspirated 61mls of blood and flushed catheter gently with 29mls of NS.   Labs reviewed . HR 102 and MD aware. Message received to proceed with treatment today.   Treatment given today per MD orders. Tolerated infusion without adverse affects. Vital signs stable. No complaints at this time. Discharged from clinic ambulatory. 5FU pump infusing, RUN noted on screen.  F/U with [facility] as scheduled.

## 2018-08-27 NOTE — Patient Instructions (Signed)
[facility] at [facility] Discharge Instructions  You were seen today by Dr. Katragadda. He went over your recent lab results. He will see you back in 2 weeks for labs and follow up.   Thank you for choosing [facility] at [facility] to provide your oncology and hematology care.  To afford each patient quality time with our provider, please arrive at least 15 minutes before your scheduled appointment time.   If you have a lab appointment with the Cancer Center please come in thru the  Main Entrance and check in at the main information desk  You need to re-schedule your appointment should you arrive 10 or more minutes late.  We strive to give you quality time with our providers, and arriving late affects you and other patients whose appointments are after yours.  Also, if you no show three or more times for appointments you may be dismissed from the clinic at the providers discretion.     Again, thank you for choosing [facility].  Our hope is that these requests will decrease the amount of time that you wait before being seen by our physicians.       _____________________________________________________________  Should you have questions after your visit to [facility], please contact our office at [phone] between the hours of 8:00 a.m. and 4:30 p.m.  Voicemails left after 4:00 p.m. will not be returned until the following business day.  For prescription refill requests, have your pharmacy contact our office and allow 72 hours.    Cancer Center Support Programs:   > Cancer Support Group  2nd Tuesday of the month 1pm-2pm, Journey Room    

## 2018-08-27 NOTE — Patient Instructions (Signed)
[facility] Discharge Instructions for Patients Receiving Chemotherapy  Today you received the following chemotherapy agents   To help prevent nausea and vomiting after your treatment, we encourage you to take your nausea medication   If you develop nausea and vomiting that is not controlled by your nausea medication, call the clinic.   BELOW ARE SYMPTOMS THAT SHOULD BE REPORTED IMMEDIATELY:  *FEVER GREATER THAN 100.5 F  *CHILLS WITH OR WITHOUT FEVER  NAUSEA AND VOMITING THAT IS NOT CONTROLLED WITH YOUR NAUSEA MEDICATION  *UNUSUAL SHORTNESS OF BREATH  *UNUSUAL BRUISING OR BLEEDING  TENDERNESS IN MOUTH AND THROAT WITH OR WITHOUT PRESENCE OF ULCERS  *URINARY PROBLEMS  *BOWEL PROBLEMS  UNUSUAL RASH Items with * indicate a potential emergency and should be followed up as soon as possible.  Feel free to call the clinic should you have any questions or concerns. The clinic phone number is [phone].  Please show the CHEMO ALERT CARD at check-in to the Emergency Department and triage nurse.   

## 2018-08-27 NOTE — Assessment & Plan Note (Signed)
1.  Metastatic colon cancer, K-ras mutation positive, MS-stable: - Status post FOLFOX for 12 cycles in the adjuvant setting from 03/30/2015 through 09/06/2015. - PET scan on 11/19/2017 showing peritoneal carcinomatosis, right paratracheal lymph node, nonspecific increased uptake in the right hilar region.  CEA was 12.9 on 11/16/2017. -9 cycles of FOLFIRI from 01/09/2018 through 05/08/2018 with addition of bevacizumab during cycle 5. - Maintenance 5-FU/leucovorin and bevacizumab started on 05/22/2018. - CEA on 08/13/2018 increased to 10.4 from 5.1 (on 06/26/2018). - CT CAP on 08/26/2018 shows stable peritoneal metastasis with peritoneal implants and mesenteric lesions.  No new or progressive findings.  No hepatic metastatic disease.  Few scattered tiny lung nodules with no evidence of pulmonary mets. - We have reviewed his labs.  He will proceed with his next cycle.  He will be seen back in 2 weeks for follow-up.  2.  Pulmonary embolism: -Incidental PE on CT chest on 05/20/2018.  CT angiogram confirmed bilateral pulmonary emboli. - He is taking Xarelto 20 mg without any bleeding issues.  3.  Peripheral neuropathy: -He has pre-existing neuropathy from prior oxaliplatin.  Numbness in the legs has been stable.  4.  Diarrhea: - Chemotherapy-induced.  He will continue Lomotil 2 tablets 2-3 times daily.

## 2018-08-29 ENCOUNTER — Other Ambulatory Visit: Payer: Self-pay

## 2018-08-29 ENCOUNTER — Encounter (HOSPITAL_COMMUNITY): Payer: Self-pay

## 2018-08-29 ENCOUNTER — Inpatient Hospital Stay (HOSPITAL_COMMUNITY): Payer: Medicaid Other

## 2018-08-29 VITALS — BP 123/69 | HR 100 | Temp 98.9°F | Resp 18

## 2018-08-29 DIAGNOSIS — C188 Malignant neoplasm of overlapping sites of colon: Secondary | ICD-10-CM

## 2018-08-29 DIAGNOSIS — Z5111 Encounter for antineoplastic chemotherapy: Secondary | ICD-10-CM | POA: Diagnosis not present

## 2018-08-29 DIAGNOSIS — C184 Malignant neoplasm of transverse colon: Secondary | ICD-10-CM

## 2018-08-29 MED ORDER — SODIUM CHLORIDE 0.9% FLUSH
10.0000 mL | INTRAVENOUS | Status: DC | PRN
  Administered 2018-08-29: 10 mL
  Filled 2018-08-29: qty 10

## 2018-08-29 MED ORDER — HEPARIN SOD (PORK) LOCK FLUSH 100 UNIT/ML IV SOLN
500.0000 [IU] | Freq: Once | INTRAVENOUS | Status: AC | PRN
  Administered 2018-08-29: 500 [IU]

## 2018-08-29 NOTE — Patient Instructions (Signed)
[place] at [facility] Discharge Instructions  5FU pump discontinued with portacath flushed today. Follow-up as scheduled. Call clinic for any questions or concerns   Thank you for choosing [place] at [facility] to provide your oncology and hematology care.  To afford each patient quality time with our provider, please arrive at least 15 minutes before your scheduled appointment time.   If you have a lab appointment with the [place] please come in thru the  Main Entrance and check in at the main information desk  You need to re-schedule your appointment should you arrive 10 or more minutes late.  We strive to give you quality time with our providers, and arriving late affects you and other patients whose appointments are after yours.  Also, if you no show three or more times for appointments you may be dismissed from the clinic at the providers discretion.     Again, thank you for choosing [facility].  Our hope is that these requests will decrease the amount of time that you wait before being seen by our physicians.       _____________________________________________________________  Should you have questions after your visit to [facility], please contact our office at [phone] between the hours of 8:00 a.m. and 4:30 p.m.  Voicemails left after 4:00 p.m. will not be returned until the following business day.  For prescription refill requests, have your pharmacy contact our office and allow 72 hours.    Cancer Center Support Programs:   > Cancer Support Group  2nd Tuesday of the month 1pm-2pm, Journey Room

## 2018-08-29 NOTE — Progress Notes (Signed)
Erik [name] tolerated 5FU pump well without complaints or incident. 5FU pump discontinued with portacath flushed per protocol then de-accessed. VSS Pt discharged self ambulatory using his cane in satisfactory condition

## 2018-09-10 ENCOUNTER — Encounter (HOSPITAL_COMMUNITY): Payer: Self-pay | Admitting: Hematology

## 2018-09-10 ENCOUNTER — Inpatient Hospital Stay (HOSPITAL_COMMUNITY): Payer: Medicaid Other

## 2018-09-10 ENCOUNTER — Inpatient Hospital Stay (HOSPITAL_BASED_OUTPATIENT_CLINIC_OR_DEPARTMENT_OTHER): Payer: Medicaid Other | Admitting: Hematology

## 2018-09-10 ENCOUNTER — Inpatient Hospital Stay (HOSPITAL_COMMUNITY): Payer: Medicaid Other | Attending: Hematology

## 2018-09-10 ENCOUNTER — Other Ambulatory Visit: Payer: Self-pay

## 2018-09-10 VITALS — BP 142/78 | HR 95 | Temp 97.6°F | Resp 18

## 2018-09-10 DIAGNOSIS — Z5112 Encounter for antineoplastic immunotherapy: Secondary | ICD-10-CM | POA: Diagnosis present

## 2018-09-10 DIAGNOSIS — C184 Malignant neoplasm of transverse colon: Secondary | ICD-10-CM

## 2018-09-10 DIAGNOSIS — Z5111 Encounter for antineoplastic chemotherapy: Secondary | ICD-10-CM | POA: Diagnosis not present

## 2018-09-10 DIAGNOSIS — C188 Malignant neoplasm of overlapping sites of colon: Secondary | ICD-10-CM

## 2018-09-10 DIAGNOSIS — E538 Deficiency of other specified B group vitamins: Secondary | ICD-10-CM

## 2018-09-10 DIAGNOSIS — C189 Malignant neoplasm of colon, unspecified: Secondary | ICD-10-CM

## 2018-09-10 DIAGNOSIS — G620 Drug-induced polyneuropathy: Secondary | ICD-10-CM | POA: Diagnosis not present

## 2018-09-10 LAB — CBC WITH DIFFERENTIAL/PLATELET
Abs Immature Granulocytes: 0.02 10*3/uL (ref 0.00–0.07)
Basophils Absolute: 0.1 10*3/uL (ref 0.0–0.1)
Basophils Relative: 1 %
Eosinophils Absolute: 0.2 10*3/uL (ref 0.0–0.5)
Eosinophils Relative: 3 %
HCT: 44.9 % (ref 39.0–52.0)
Hemoglobin: 14.3 g/dL (ref 13.0–17.0)
Immature Granulocytes: 0 %
Lymphocytes Relative: 21 %
Lymphs Abs: 1.3 10*3/uL (ref 0.7–4.0)
MCH: 32.1 pg (ref 26.0–34.0)
MCHC: 31.8 g/dL (ref 30.0–36.0)
MCV: 100.9 fL — ABNORMAL HIGH (ref 80.0–100.0)
Monocytes Absolute: 0.7 10*3/uL (ref 0.1–1.0)
Monocytes Relative: 12 %
Neutro Abs: 3.8 10*3/uL (ref 1.7–7.7)
Neutrophils Relative %: 63 %
Platelets: 192 10*3/uL (ref 150–400)
RBC: 4.45 MIL/uL (ref 4.22–5.81)
RDW: 16.3 % — ABNORMAL HIGH (ref 11.5–15.5)
WBC: 6 10*3/uL (ref 4.0–10.5)
nRBC: 0 % (ref 0.0–0.2)

## 2018-09-10 LAB — COMPREHENSIVE METABOLIC PANEL
ALT: 13 U/L (ref 0–44)
AST: 19 U/L (ref 15–41)
Albumin: 3.5 g/dL (ref 3.5–5.0)
Alkaline Phosphatase: 95 U/L (ref 38–126)
Anion gap: 10 (ref 5–15)
BUN: 8 mg/dL (ref 6–20)
CO2: 27 mmol/L (ref 22–32)
Calcium: 8.9 mg/dL (ref 8.9–10.3)
Chloride: 101 mmol/L (ref 98–111)
Creatinine, Ser: 0.86 mg/dL (ref 0.61–1.24)
GFR calc Af Amer: >60 mL/min (ref >60)
GFR calc non Af Amer: >60 mL/min (ref >60)
Glucose, Bld: 102 mg/dL — ABNORMAL HIGH (ref 70–99)
Potassium: 4.6 mmol/L (ref 3.5–5.1)
Sodium: 138 mmol/L (ref 135–145)
Total Bilirubin: 0.5 mg/dL (ref 0.3–1.2)
Total Protein: 6.7 g/dL (ref 6.5–8.1)

## 2018-09-10 MED ORDER — SODIUM CHLORIDE 0.9 % IV SOLN
4.8000 mg/kg | Freq: Once | INTRAVENOUS | Status: AC
  Administered 2018-09-10: 500 mg via INTRAVENOUS
  Filled 2018-09-10: qty 16

## 2018-09-10 MED ORDER — SODIUM CHLORIDE 0.9 % IV SOLN: Freq: Once | INTRAVENOUS | Status: DC

## 2018-09-10 MED ORDER — SODIUM CHLORIDE 0.9 % IV SOLN
10.0000 mg | Freq: Once | INTRAVENOUS | Status: AC
  Administered 2018-09-10: 10 mg via INTRAVENOUS
  Filled 2018-09-10: qty 10

## 2018-09-10 MED ORDER — PALONOSETRON HCL INJECTION 0.25 MG/5ML
0.2500 mg | Freq: Once | INTRAVENOUS | Status: AC
  Administered 2018-09-10: 0.25 mg via INTRAVENOUS
  Filled 2018-09-10: qty 5

## 2018-09-10 MED ORDER — SODIUM CHLORIDE 0.9 % IV SOLN
Freq: Once | INTRAVENOUS | Status: AC
  Administered 2018-09-10: 09:00:00 via INTRAVENOUS

## 2018-09-10 MED ORDER — SODIUM CHLORIDE 0.9 % IV SOLN
900.0000 mg | Freq: Once | INTRAVENOUS | Status: AC
  Administered 2018-09-10: 900 mg via INTRAVENOUS
  Filled 2018-09-10: qty 35

## 2018-09-10 MED ORDER — SODIUM CHLORIDE 0.9% FLUSH
10.0000 mL | INTRAVENOUS | Status: DC | PRN
  Administered 2018-09-10: 10 mL
  Filled 2018-09-10: qty 10

## 2018-09-10 MED ORDER — HEPARIN SOD (PORK) LOCK FLUSH 100 UNIT/ML IV SOLN: 500.0000 [IU] | Freq: Once | INTRAVENOUS | Status: DC | PRN

## 2018-09-10 MED ORDER — SODIUM CHLORIDE 0.9 % IV SOLN
2400.0000 mg/m2 | INTRAVENOUS | Status: DC
  Administered 2018-09-10: 5650 mg via INTRAVENOUS
  Filled 2018-09-10: qty 113

## 2018-09-10 MED ORDER — FLUOROURACIL CHEMO INJECTION 2.5 GM/50ML
400.0000 mg/m2 | Freq: Once | INTRAVENOUS | Status: AC
  Administered 2018-09-10: 950 mg via INTRAVENOUS
  Filled 2018-09-10: qty 19

## 2018-09-10 MED ORDER — ATROPINE SULFATE 1 MG/ML IJ SOLN
0.5000 mg | Freq: Once | INTRAMUSCULAR | Status: AC
  Administered 2018-09-10: 0.5 mg via INTRAVENOUS
  Filled 2018-09-10: qty 1

## 2018-09-10 MED ORDER — CYANOCOBALAMIN 1000 MCG/ML IJ SOLN
1000.0000 ug | Freq: Once | INTRAMUSCULAR | Status: AC
  Administered 2018-09-10: 1000 ug via INTRAMUSCULAR
  Filled 2018-09-10: qty 1

## 2018-09-10 NOTE — Progress Notes (Signed)
[address], [place] [address]   CLINIC:  Medical Oncology/Hematology  PCP:  Patient, No Pcp Per No address on file None   REASON FOR VISIT:  Follow-up for metastatic colon cancer   BRIEF ONCOLOGIC HISTORY:  Oncology History Overview Note  Stage IIIC ([id number]) adenocarcinoma of transverse colon, diagnosed on colonoscopy by Dr. Oneida Alar on 01/12/2015 followed by definitive surgery by Dr. [name] with right hemicolectomy on 02/12/2015.  He then underwent FOLFOX x 12 cycles in the adjuvant setting (03/30/2015- 09/06/2015).   Malignant neoplasm of transverse colon ([place])  01/12/2015 Pathologic Stage   Colon, biopsy, distal transverse - TUBULOVILLOUS ADENOMA WITH HIGH GRADE DYSPLASIA.   01/12/2015 Procedure   Colonoscopy by Dr. Oneida Alar.   01/12/2015 Tumor Marker   CEA: 6.6 (H)    01/18/2015 Imaging   CT abd/pelvis- Apple-core lesion identified in the mid transverse colon without obstruction. No evidence for lymphadenopathy in the gastrohepatic ligament or omentum.  Stable 8 mm hypo attenuating lesion in the left liver, likely a cyst.   02/10/2015 Initial Diagnosis   Adenocarcinoma of transverse colon ([place])   02/12/2015 Definitive Surgery   [name], Extended right hemicolectomy    02/12/2015 Pathology Results   Mucinous adenocarcinoma with penetration of visceral peritoneum, 4/19 lymph nodes for metastatic disease, negative resection margins, with LVI and perineural invasion   03/30/2015 - 09/06/2015 Chemotherapy   FOLFOX x 12 cycles   05/25/2015 Treatment Plan Change   5 FU bolus discontinued for cycle #5   06/08/2015 Treatment Plan Change   Treatment deferred x 1 week   06/15/2015 Treatment Plan Change   5FU CI decreased by 10% and Oxaliplatin reduced by 15% for cycles #6-#11; Oxaliplatin dropped for cycle #12 d/t neuropathy.    10/20/2015 Imaging   CT CAP- Right hemicolectomy without evidence of metastatic disease. 2. Previously  measured ground-glass lesion in the left upper lobe has resolved. 3. Probable food debris in the stomach, simulating gastric wall thickening. Please correlate clinically. 4. 6 mm irregular nodular density in the left upper lobe, stable. Continued attention on followup exams is warranted.   01/25/2016 Procedure   Colonoscopy by Dr. Oneida Alar- Non-thrombosed external hemorrhoids found on digital rectal exam. - One 4 mm polyp in the rectum, removed with a cold biopsy forceps. Resected and retrieved. - Congested mucosa in the neo-terminal ileum. Biopsied. - Redundant colon. - Internal hemorrhoids.   01/26/2016 Pathology Results   1. Terminal ileum, biopsy - MILD ACUTE (ACTIVE) ILEITIS. - NO DYSPLASIA OR MALIGNANCY IDENTIFIED - SEE COMMENT. 2. Rectum, polyp(s) - HYPERPLASTIC POLYP (X 1). - NO DYSPLASIA OR MALIGNANCY IDENTIFIED.   04/13/2016 Imaging   CT chest- Stable CT chest. 6 mm irregular nodule anterior left upper lobe is stable. The scattered areas of peribronchovascular micro nodularity in the lungs bilaterally are unchanged.   10/19/2016 Imaging   CT CAP: 1. Status post right hemicolectomy. No findings to suggest metastatic disease in the abdomen or pelvis. 2. Aortic atherosclerosis. 3. Additional incidental findings, as above. Aortic Atherosclerosis (ICD10-I70.0).   01/09/2018 -  Chemotherapy   The patient had palonosetron (ALOXI) injection 0.25 mg, 0.25 mg, Intravenous,  Once, 17 of 23 cycles Administration: 0.25 mg (01/09/2018), 0.25 mg (01/23/2018), 0.25 mg (02/11/2018), 0.25 mg (02/25/2018), 0.25 mg (03/12/2018), 0.25 mg (03/27/2018), 0.25 mg (04/10/2018), 0.25 mg (04/24/2018), 0.25 mg (05/08/2018), 0.25 mg (05/22/2018), 0.25 mg (06/10/2018), 0.25 mg (06/26/2018), 0.25 mg (07/10/2018), 0.25 mg (07/24/2018), 0.25 mg (08/13/2018), 0.25 mg (08/27/2018), 0.25 mg (09/10/2018)  irinotecan (CAMPTOSAR) 420 mg in dextrose 5 % 500 mL chemo infusion, 180 mg/m2 = 420 mg, Intravenous,  Once, 9 of 9  cycles Administration: 420 mg (01/09/2018), 420 mg (01/23/2018), 420 mg (02/11/2018), 420 mg (02/25/2018), 420 mg (03/12/2018), 420 mg (03/27/2018), 420 mg (04/10/2018), 420 mg (04/24/2018), 420 mg (05/08/2018) leucovorin 900 mg in dextrose 5 % 250 mL infusion, 944 mg, Intravenous,  Once, 17 of 23 cycles Administration: 900 mg (01/09/2018), 900 mg (01/23/2018), 900 mg (02/11/2018), 900 mg (02/25/2018), 900 mg (03/12/2018), 900 mg (03/27/2018), 900 mg (04/10/2018), 900 mg (04/24/2018), 900 mg (05/08/2018), 900 mg (06/10/2018), 900 mg (06/26/2018), 900 mg (07/10/2018), 900 mg (07/24/2018), 900 mg (08/13/2018), 900 mg (08/27/2018), 900 mg (09/10/2018) fluorouracil (ADRUCIL) chemo injection 950 mg, 400 mg/m2 = 950 mg, Intravenous,  Once, 17 of 23 cycles Administration: 950 mg (01/09/2018), 950 mg (01/23/2018), 950 mg (02/11/2018), 950 mg (02/25/2018), 950 mg (03/12/2018), 950 mg (03/27/2018), 950 mg (04/10/2018), 950 mg (04/24/2018), 950 mg (05/08/2018), 950 mg (05/22/2018), 950 mg (06/10/2018), 950 mg (06/26/2018), 950 mg (07/10/2018), 950 mg (07/24/2018), 950 mg (08/13/2018), 950 mg (08/27/2018), 950 mg (09/10/2018) fluorouracil (ADRUCIL) 5,650 mg in sodium chloride 0.9 % 137 mL chemo infusion, 2,400 mg/m2 = 5,650 mg, Intravenous, 1 Day/Dose, 17 of 23 cycles Administration: 5,650 mg (01/09/2018), 5,650 mg (01/23/2018), 5,650 mg (02/11/2018), 5,650 mg (02/25/2018), 5,650 mg (03/12/2018), 5,650 mg (03/27/2018), 5,650 mg (04/10/2018), 5,650 mg (04/24/2018), 5,650 mg (05/08/2018), 5,650 mg (05/22/2018), 5,650 mg (06/10/2018), 5,650 mg (06/26/2018), 5,650 mg (07/10/2018), 5,650 mg (07/24/2018), 5,650 mg (08/13/2018), 5,650 mg (08/27/2018), 5,650 mg (09/10/2018)  for chemotherapy treatment.    01/23/2018 Cancer Staging   Staging form: Colon and Rectum, AJCC 7th Edition - Pathologic: M1 - Signed by Zoila Shutter, MD on 01/23/2018   Malignant neoplasm of overlapping sites of colon ([place])  12/26/2017 Initial Diagnosis   Cancer of overlapping sites of colon metastatic to intra-abdominal lymph  node ([place])   01/09/2018 -  Chemotherapy   The patient had palonosetron (ALOXI) injection 0.25 mg, 0.25 mg, Intravenous,  Once, 13 of 14 cycles Administration: 0.25 mg (01/09/2018), 0.25 mg (01/23/2018), 0.25 mg (02/11/2018), 0.25 mg (02/25/2018), 0.25 mg (03/12/2018), 0.25 mg (03/27/2018), 0.25 mg (04/10/2018), 0.25 mg (04/24/2018), 0.25 mg (05/08/2018), 0.25 mg (05/22/2018), 0.25 mg (06/10/2018), 0.25 mg (06/26/2018), 0.25 mg (07/10/2018) irinotecan (CAMPTOSAR) 420 mg in dextrose 5 % 500 mL chemo infusion, 180 mg/m2 = 420 mg, Intravenous,  Once, 9 of 9 cycles Administration: 420 mg (01/09/2018), 420 mg (01/23/2018), 420 mg (02/11/2018), 420 mg (02/25/2018), 420 mg (03/12/2018), 420 mg (03/27/2018), 420 mg (04/10/2018), 420 mg (04/24/2018), 420 mg (05/08/2018) leucovorin 900 mg in dextrose 5 % 250 mL infusion, 944 mg, Intravenous,  Once, 13 of 14 cycles Administration: 900 mg (01/09/2018), 900 mg (01/23/2018), 900 mg (02/11/2018), 900 mg (02/25/2018), 900 mg (03/12/2018), 900 mg (03/27/2018), 900 mg (04/10/2018), 900 mg (04/24/2018), 900 mg (05/08/2018), 900 mg (06/10/2018), 900 mg (06/26/2018), 900 mg (07/10/2018) fluorouracil (ADRUCIL) chemo injection 950 mg, 400 mg/m2 = 950 mg, Intravenous,  Once, 13 of 14 cycles Administration: 950 mg (01/09/2018), 950 mg (01/23/2018), 950 mg (02/11/2018), 950 mg (02/25/2018), 950 mg (03/12/2018), 950 mg (03/27/2018), 950 mg (04/10/2018), 950 mg (04/24/2018), 950 mg (05/08/2018), 950 mg (05/22/2018), 950 mg (06/10/2018), 950 mg (06/26/2018), 950 mg (07/10/2018) fluorouracil (ADRUCIL) 5,650 mg in sodium chloride 0.9 % 137 mL chemo infusion, 2,400 mg/m2 = 5,650 mg, Intravenous, 1 Day/Dose, 13 of 14 cycles Administration: 5,650 mg (01/09/2018), 5,650 mg (01/23/2018), 5,650 mg (02/11/2018), 5,650 mg (02/25/2018), 5,650 mg (03/12/2018),  5,650 mg (03/27/2018), 5,650 mg (04/10/2018), 5,650 mg (04/24/2018), 5,650 mg (05/08/2018), 5,650 mg (05/22/2018), 5,650 mg (06/10/2018), 5,650 mg (06/26/2018), 5,650 mg (07/10/2018)  for chemotherapy treatment.     Colon cancer metastasized to multiple sites [facility])  01/16/2018 Initial Diagnosis   Colon cancer metastasized to multiple sites [facility])   01/23/2018 - 02/10/2018 Chemotherapy   The patient had panitumumab (VECTIBIX) 600 mg in sodium chloride 0.9 % 100 mL chemo infusion, 640 mg, Intravenous,  Once, 1 of 3 cycles Administration: 600 mg (01/23/2018)  for chemotherapy treatment.    03/12/2018 -  Chemotherapy   The patient had bevacizumab (AVASTIN) 500 mg in sodium chloride 0.9 % 100 mL chemo infusion, 525 mg, Intravenous,  Once, 13 of 22 cycles Administration: 500 mg (03/12/2018), 500 mg (03/27/2018), 500 mg (04/10/2018), 500 mg (04/24/2018), 500 mg (06/10/2018), 500 mg (05/08/2018), 500 mg (05/22/2018), 500 mg (06/26/2018), 500 mg (07/10/2018), 500 mg (07/24/2018), 500 mg (08/13/2018), 500 mg (08/27/2018), 500 mg (09/10/2018)  for chemotherapy treatment.       CANCER STAGING: Cancer Staging Malignant neoplasm of transverse colon [facility]) Staging form: Colon and Rectum, AJCC 7th Edition - Pathologic stage from 03/04/2015: Stage IIIC (T4a, N2a, cM0) - Signed by Baird Cancer, PA-C on 03/04/2015 - Pathologic: M1 - Signed by Zoila Shutter, MD on 01/23/2018    INTERVAL HISTORY:  Mr. Levinson 63 y.o. male here for follow-up of metastatic colon cancer next cycle of chemotherapy.  Appetite and energy levels are 75%.  Pain in the right shoulder is 3 out of 10.  He is having diarrhea every day for which he takes Imodium 2 tablets 2-3 times a day.  Denies any nosebleeds, bleeding per rectum or melena.  No fevers or infections were reported.  Denies any nausea vomiting or constipation.  Denies any ER visits or hospitalizations.   REVIEW OF SYSTEMS:  Review of Systems  Gastrointestinal: Positive for diarrhea.  Neurological: Positive for numbness.  All other systems reviewed and are negative.    PAST MEDICAL/SURGICAL HISTORY:  Past Medical History:  Diagnosis Date   Abnormal stress echocardiogram    Adenocarcinoma of  transverse colon ([place]) 02/10/2015   Alcohol abuse    Heavy Use up until 2010   Anxiety    Blood transfusion without reported diagnosis    Cancer of overlapping sites of colon metastatic to intra-abdominal lymph node ([place]) 12/26/2017   COPD (chronic obstructive pulmonary disease) ([facility])    Depression    Emphysema of lung ([facility])    GERD (gastroesophageal reflux disease)    Head trauma 2001   closed head injury; coma for 4 weeks   Hypercholesterolemia    Hypertension    ING HERN W/GANGREN RECUR UNILAT/UNSPEC ING HERN 04/21/2009   Qualifier: Diagnosis of  By: Verl Blalock, MD, [place] [facility]    PTSD (post-traumatic stress disorder)    Pulmonary nodule 06/14/2016   SAH (subarachnoid hemorrhage) ([place]) 08/31/2012   SDH (subdural hematoma) ([place]) 08/31/2012   Past Surgical History:  Procedure Laterality Date   BIOPSY  01/25/2016   Procedure: BIOPSY;  Surgeon: Danie Binder, MD;  [facility];  Service: Endoscopy;;  ileum;    cardiac cath     COLONOSCOPY  2011   Dr. Oneida Alar: multiple adenomas and hyperplastic polyps   COLONOSCOPY WITH PROPOFOL N/A 01/12/2015   Procedure: COLONOSCOPY WITH PROPOFOL;  Surgeon: Danie Binder, MD;  [facility];  Service: Endoscopy;  Laterality: N/A;  1030   COLONOSCOPY WITH PROPOFOL N/A 01/25/2016  Procedure: COLONOSCOPY WITH PROPOFOL;  Surgeon: Danie Binder, MD;  [facility];  Service: Endoscopy;  Laterality: N/A;  [place]   ESOPHAGOGASTRODUODENOSCOPY (EGD) WITH PROPOFOL N/A 01/12/2015   Procedure: ESOPHAGOGASTRODUODENOSCOPY (EGD) WITH PROPOFOL;  Surgeon: Danie Binder, MD;  [facility];  Service: Endoscopy;  Laterality: N/A;   head injury surgery     HERNIA REPAIR Right 2012   Inguinal- [facility]   KIDNEY SURGERY     >30 years ago   LAPAROSCOPIC RIGHT HEMI COLECTOMY Left 02/10/2015   Procedure: LAPAROSCOPIC THEN OPEN RIGHT HEMI COLECTOMY;  Surgeon: [name], MD;  Location:  [facility] ORS;  Service: General;  Laterality: Left;   LAPAROTOMY N/A 12/19/2017   Procedure: EXPLORATORY LAPAROTOMY;  Surgeon: Aviva Signs, MD;  Location: [facility];  Service: General;  Laterality: N/A;   POLYPECTOMY  01/25/2016   Procedure: POLYPECTOMY;  Surgeon: Danie Binder, MD;  [facility];  Service: Endoscopy;;  colon   PORT-A-CATH REMOVAL N/A 02/26/2017   Procedure: MINOR REMOVAL PORT-A-CATH;  Surgeon: Aviva Signs, MD;  Location: [facility];  Service: General;  Laterality: N/A;  Pt to arrive at [place] N/A 03/24/2015   Procedure: [place];  Surgeon: Jules Husbands, MD;  Location: [facility] ORS;  Service: General;  Laterality: N/A;   PORTACATH PLACEMENT Left 01/07/2018   Procedure: INSERTION PORT A CATH (ATTACHED CATHETER IN LEFT SUBCLAVIAN);  Surgeon: Aviva Signs, MD;  Location: [facility];  Service: General;  Laterality: Left;     SOCIAL HISTORY:  Social History   Socioeconomic History   Marital status: Divorced    Spouse name: Not on file   Number of children: 2   Years of education: 12   Highest education level: Not on file  Occupational History   Occupation: disabled/unemployed   Occupation: unemployed/Mediicaid only    Comment: NO INCOME  Social Designer, fashion/clothing strain: Very hard   Food insecurity    Worry: Often true    Inability: Often true   Transportation needs    Medical: Yes    Non-medical: No  Tobacco Use   Smoking status: Former Smoker    Packs/day: 0.10    Years: 30.00    Pack years: 3.00    Types: Cigarettes    Start date: 02/06/1973    Quit date: 12/09/2017    Years since quitting: 0.7   Smokeless tobacco: Never Used   Tobacco comment: Quit on 12/09/2017  Substance and Sexual Activity   Alcohol use: Yes    Alcohol/week: 0.0 standard drinks    Comment: beer occ, history of ETOH abuse in remote past.    Drug use: No   Sexual activity: Never    Partners: Female    Birth control/protection:  None  Lifestyle   Physical activity    Days per week: Not on file    Minutes per session: Not on file   Stress: Not on file  Relationships   Social connections    Talks on phone: Not on file    Gets together: Not on file    Attends religious service: Not on file    Active member of club or organization: Not on file    Attends meetings of clubs or organizations: Not on file    Relationship status: Not on file   Intimate partner violence    Fear of current or ex partner: Not on file    Emotionally abused:  Not on file    Physically abused: Not on file    Forced sexual activity: Not on file  Other Topics Concern   Not on file  Social History Narrative   Single   Lives alone   Turned down for disability    FAMILY HISTORY:  Family History  Problem Relation Age of Onset   Pulmonary embolism Mother    Breast cancer Mother    Cancer Mother        breast cancer   Arthritis Mother    Heart disease Father    Cancer Father 62       Leukemia   Cancer Paternal Grandfather        Lung   Ataxia Neg Hx    Chorea Neg Hx    Dementia Neg Hx    Mental retardation Neg Hx    Migraines Neg Hx    Multiple sclerosis Neg Hx    Neurofibromatosis Neg Hx    Neuropathy Neg Hx    Parkinsonism Neg Hx    Seizures Neg Hx    Stroke Neg Hx    Colon cancer Neg Hx     CURRENT MEDICATIONS:  Outpatient Encounter Medications as of 09/10/2018  Medication Sig Note   amitriptyline (ELAVIL) 25 MG tablet Take 1 tablet (25 mg total) by mouth at bedtime.    atorvastatin (LIPITOR) 20 MG tablet TAKE 1 TABLET BY MOUTH AT BEDTIME    buPROPion (WELLBUTRIN SR) 150 MG 12 hr tablet Take 150 mg by mouth every morning.     busPIRone (BUSPAR) 10 MG tablet Take 10 mg by mouth 2 (two) times daily.    fluorouracil CALGB 24097 in sodium chloride 0.9 % 150 mL Inject 5,650 mg into the vein. Over 46 hours    FLUoxetine (PROZAC) 20 MG capsule TAKE 1 CAPSULE BY MOUTH 2 TIMES A DAY (Patient taking  differently: Take 20 mg by mouth 2 (two) times daily. ) 06/10/2018: 1 tab TID   gabapentin (NEURONTIN) 300 MG capsule TAKE 1 CAPSULE BY MOUTH THREE TIMES A DAY    IRINOTECAN HCL IV Inject 420 mg into the vein every 14 (fourteen) days.    LEUCOVORIN CALCIUM IV Inject 944 mg into the vein every 14 (fourteen) days.    lidocaine-prilocaine (EMLA) cream APPLY A SMALL AMOUNT OVER PORT SITE AND COVER WITH PLASTIC WRAP ONE HOUR PRIOR TO APPOINTMENT    loperamide (IMODIUM) 2 MG capsule 2 tablets twice daily    omeprazole (PRILOSEC) 20 MG capsule Take 20 mg by mouth every morning.     ondansetron (ZOFRAN) 8 MG tablet Take 1 tablet (8 mg total) by mouth 2 (two) times daily as needed for refractory nausea / vomiting. Start on day 3 after chemotherapy.    PROAIR HFA 108 (90 Base) MCG/ACT inhaler INHALE 2 PUFFS BY MOUTH EVERY 6 HOURS AS NEEDED FOR SHORTNESS OF BREATH/WHEEZING.    prochlorperazine (COMPAZINE) 10 MG tablet Take 1 tablet (10 mg total) by mouth every 6 (six) hours as needed (NAUSEA).    rivaroxaban (XARELTO) 20 MG TABS tablet Take 1 tablet (20 mg total) by mouth daily with supper.    No facility-administered encounter medications on file as of 09/10/2018.     ALLERGIES:  No Known Allergies   PHYSICAL EXAM:  ECOG Performance status: 1  Vitals:   09/10/18 0752  BP: 132/84  Pulse: 98  Resp: 18  Temp: 97.9 F (36.6 C)  SpO2: 95%   Filed Weights   09/10/18 0752  Weight:  227 lb (103 kg)    Physical Exam Vitals signs reviewed.  Constitutional:      Appearance: Normal appearance.  Cardiovascular:     Rate and Rhythm: Normal rate and regular rhythm.     Heart sounds: Normal heart sounds.  Pulmonary:     Effort: Pulmonary effort is normal.     Breath sounds: Normal breath sounds.  Abdominal:     General: There is no distension.     Palpations: Abdomen is soft. There is no mass.  Musculoskeletal:        General: No swelling.  Skin:    General: Skin is warm.    Neurological:     General: No focal deficit present.     Mental Status: He is alert and oriented to person, place, and time.  Psychiatric:        Mood and Affect: Mood normal.        Behavior: Behavior normal.      LABORATORY DATA:  I have reviewed the labs as listed.  CBC    Component Value Date/Time   WBC 6.0 09/10/2018 0741   RBC 4.45 09/10/2018 0741   HGB 14.3 09/10/2018 0741   HCT 44.9 09/10/2018 0741   PLT 192 09/10/2018 0741   MCV 100.9 (H) 09/10/2018 0741   MCH 32.1 09/10/2018 0741   MCHC 31.8 09/10/2018 0741   RDW 16.3 (H) 09/10/2018 0741   LYMPHSABS 1.3 09/10/2018 0741   MONOABS 0.7 09/10/2018 0741   EOSABS 0.2 09/10/2018 0741   BASOSABS 0.1 09/10/2018 0741   CMP Latest Ref Rng & Units 09/10/2018 08/27/2018 08/13/2018  Glucose 70 - 99 mg/dL 102(H) 107(H) 116(H)  BUN 6 - 20 mg/dL 8 5(L) 5(L)  Creatinine 0.61 - 1.24 mg/dL 0.86 0.92 0.82  Sodium 135 - 145 mmol/L 138 135 136  Potassium 3.5 - 5.1 mmol/L 4.6 4.6 3.4(L)  Chloride 98 - 111 mmol/L 101 98 98  CO2 22 - 32 mmol/L '27 29 26  ' Calcium 8.9 - 10.3 mg/dL 8.9 9.2 8.8(L)  Total Protein 6.5 - 8.1 g/dL 6.7 6.8 6.5  Total Bilirubin 0.3 - 1.2 mg/dL 0.5 0.6 1.0  Alkaline Phos 38 - 126 U/L 95 95 144(H)  AST 15 - 41 U/L 19 19 80(H)  ALT 0 - 44 U/L 13 16 42       DIAGNOSTIC IMAGING:  I have independently reviewed the scans and discussed with the patient.   I have reviewed Venita Lick LPN's note and agree with the documentation.  I personally performed a face-to-face visit, made revisions and my assessment and plan is as follows.    ASSESSMENT & PLAN:   Malignant neoplasm of transverse colon ([place]) 1.  Metastatic colon cancer, K-ras mutation positive, MS-stable: -Status post FOLFOX for 12 cycles and adjuvant setting from 03/30/2015 through 09/06/2015. -PET scan on 11/19/2017 showing peritoneal carcinomatosis, right paratracheal lymph node, nonspecific increased uptake in the right hilar region.  CEA was 12.9 on  11/16/2017. -9 cycles of FOLFIRI from 01/09/2018 through 05/08/2018 with addition of bevacizumab during cycle 5. -Maintenance 5-FU/leucovorin and bevacizumab started on 05/22/2018. -CEA on 08/13/2018 increased to 10.4 from 5.1 on 06/26/2018. -CT CAP on 08/26/2018 showed stable peritoneal metastasis with peritoneal implants and mesenteric lesions.  No new progressive findings.  No hepatic metastatic disease.  Few scattered tiny lung nodules with no evidence of pulmonary metastasis. - I have reviewed his blood work.  He has tolerated last cycle very well. -We will proceed with his next maintenance treatment.  I will see him back in 2 weeks for follow-up.  2.  Pulmonary embolism: -Incidental PE on CT chest on 05/20/2018.  CT angiogram confirmed bilateral PE. -He is tolerating Xarelto 20 mg daily without any bleeding issues.  3.  Peripheral neuropathy: -He has pre-existing neuropathy from prior oxaliplatin.  Numbness in the legs has been stable.  4.  Diarrhea: -Chemotherapy-induced.  He will continue Imodium 2 tablets 2-3 times a day.  Total time spent is 25 minutes with more than 50% of the time spent face-to-face discussing treatment plan and coordination of care.    Orders placed this encounter:  Orders Placed This Encounter  Procedures   CBC with Differential/Platelet   Comprehensive metabolic panel   CEA      Derek Jack, MD [place] [phone]

## 2018-09-10 NOTE — Assessment & Plan Note (Addendum)
1.  Metastatic colon cancer, K-ras mutation positive, MS-stable: -Status post FOLFOX for 12 cycles and adjuvant setting from 03/30/2015 through 09/06/2015. -PET scan on 11/19/2017 showing peritoneal carcinomatosis, right paratracheal lymph node, nonspecific increased uptake in the right hilar region.  CEA was 12.9 on 11/16/2017. -9 cycles of FOLFIRI from 01/09/2018 through 05/08/2018 with addition of bevacizumab during cycle 5. -Maintenance 5-FU/leucovorin and bevacizumab started on 05/22/2018. -CEA on 08/13/2018 increased to 10.4 from 5.1 on 06/26/2018. -CT CAP on 08/26/2018 showed stable peritoneal metastasis with peritoneal implants and mesenteric lesions.  No new progressive findings.  No hepatic metastatic disease.  Few scattered tiny lung nodules with no evidence of pulmonary metastasis. - I have reviewed his blood work.  He has tolerated last cycle very well. -We will proceed with his next maintenance treatment.  I will see him back in 2 weeks for follow-up.  2.  Pulmonary embolism: -Incidental PE on CT chest on 05/20/2018.  CT angiogram confirmed bilateral PE. -He is tolerating Xarelto 20 mg daily without any bleeding issues.  3.  Peripheral neuropathy: -He has pre-existing neuropathy from prior oxaliplatin.  Numbness in the legs has been stable.  4.  Diarrhea: -Chemotherapy-induced.  He will continue Imodium 2 tablets 2-3 times a day.

## 2018-09-10 NOTE — Progress Notes (Signed)
To treatment area for oncology follow up and chemotherapy.  Patient stated he has diarrhea three times a day and takes imodium.  Dr. Delton Coombes made aware for visit.  Continues to take blood thinner as directed.  No s/s of distress noted.   Patient seen by Dr. Delton Coombes with lab review and ok to treat today verbal order.   Patient tolerated chemotherapy with no complaints voiced.  Port site clean and dry with no bruising or swelling noted at site.  Good blood return noted before and after administration of chemotherapy.  Chemo pump connected with no alarms noted.  Dressing intact.  Patient left ambulatory with VSS and no s/s of distress noted.

## 2018-09-10 NOTE — Patient Instructions (Addendum)
Erik [name] at [facility] Discharge Instructions  You were seen today by Dr. Delton Coombes. He went over your recent lab and scan results. He will see you back in 2 weeks for labs and follow up.   Thank you for choosing [place] at [facility] to provide your oncology and hematology care.  To afford each patient quality time with our provider, please arrive at least 15 minutes before your scheduled appointment time.   If you have a lab appointment with the [facility] please come in thru the  Main Entrance and check in at the main information desk  You need to re-schedule your appointment should you arrive 10 or more minutes late.  We strive to give you quality time with our providers, and arriving late affects you and other patients whose appointments are after yours.  Also, if you no show three or more times for appointments you may be dismissed from the clinic at the providers discretion.     Again, thank you for choosing [facility].  Our hope is that these requests will decrease the amount of time that you wait before being seen by our physicians.       _____________________________________________________________  Should you have questions after your visit to [facility], please contact our office at [phone] between the hours of 8:00 a.m. and 4:30 p.m.  Voicemails left after 4:00 p.m. will not be returned until the following business day.  For prescription refill requests, have your pharmacy contact our office and allow 72 hours.    Cancer Center Support Programs:   > Cancer Support Group  2nd Tuesday of the month 1pm-2pm, Journey Room

## 2018-09-12 ENCOUNTER — Other Ambulatory Visit: Payer: Self-pay

## 2018-09-12 ENCOUNTER — Inpatient Hospital Stay (HOSPITAL_COMMUNITY): Payer: Medicaid Other

## 2018-09-12 VITALS — BP 109/73 | HR 93 | Temp 98.2°F | Resp 18

## 2018-09-12 DIAGNOSIS — C188 Malignant neoplasm of overlapping sites of colon: Secondary | ICD-10-CM

## 2018-09-12 DIAGNOSIS — C184 Malignant neoplasm of transverse colon: Secondary | ICD-10-CM

## 2018-09-12 DIAGNOSIS — Z5111 Encounter for antineoplastic chemotherapy: Secondary | ICD-10-CM | POA: Diagnosis not present

## 2018-09-12 MED ORDER — SODIUM CHLORIDE 0.9% FLUSH
10.0000 mL | INTRAVENOUS | Status: DC | PRN
  Administered 2018-09-12: 10 mL
  Filled 2018-09-12: qty 10

## 2018-09-12 MED ORDER — HEPARIN SOD (PORK) LOCK FLUSH 100 UNIT/ML IV SOLN
500.0000 [IU] | Freq: Once | INTRAVENOUS | Status: AC | PRN
  Administered 2018-09-12: 500 [IU]

## 2018-09-24 ENCOUNTER — Encounter (HOSPITAL_COMMUNITY): Payer: Self-pay

## 2018-09-24 ENCOUNTER — Inpatient Hospital Stay (HOSPITAL_COMMUNITY): Payer: Medicaid Other

## 2018-09-24 ENCOUNTER — Encounter (HOSPITAL_COMMUNITY): Payer: Self-pay | Admitting: Hematology

## 2018-09-24 ENCOUNTER — Other Ambulatory Visit: Payer: Self-pay

## 2018-09-24 ENCOUNTER — Inpatient Hospital Stay (HOSPITAL_BASED_OUTPATIENT_CLINIC_OR_DEPARTMENT_OTHER): Payer: Medicaid Other | Admitting: Hematology

## 2018-09-24 VITALS — HR 99

## 2018-09-24 DIAGNOSIS — C188 Malignant neoplasm of overlapping sites of colon: Secondary | ICD-10-CM

## 2018-09-24 DIAGNOSIS — C184 Malignant neoplasm of transverse colon: Secondary | ICD-10-CM

## 2018-09-24 DIAGNOSIS — Z5111 Encounter for antineoplastic chemotherapy: Secondary | ICD-10-CM | POA: Diagnosis not present

## 2018-09-24 DIAGNOSIS — C189 Malignant neoplasm of colon, unspecified: Secondary | ICD-10-CM

## 2018-09-24 DIAGNOSIS — C772 Secondary and unspecified malignant neoplasm of intra-abdominal lymph nodes: Secondary | ICD-10-CM

## 2018-09-24 LAB — COMPREHENSIVE METABOLIC PANEL
ALT: 11 U/L (ref 0–44)
AST: 19 U/L (ref 15–41)
Albumin: 3.7 g/dL (ref 3.5–5.0)
Alkaline Phosphatase: 94 U/L (ref 38–126)
Anion gap: 10 (ref 5–15)
BUN: 6 mg/dL (ref 6–20)
CO2: 24 mmol/L (ref 22–32)
Calcium: 8.9 mg/dL (ref 8.9–10.3)
Chloride: 101 mmol/L (ref 98–111)
Creatinine, Ser: 0.82 mg/dL (ref 0.61–1.24)
GFR calc Af Amer: >60 mL/min (ref >60)
GFR calc non Af Amer: >60 mL/min (ref >60)
Glucose, Bld: 108 mg/dL — ABNORMAL HIGH (ref 70–99)
Potassium: 4.2 mmol/L (ref 3.5–5.1)
Sodium: 135 mmol/L (ref 135–145)
Total Bilirubin: 0.4 mg/dL (ref 0.3–1.2)
Total Protein: 6.9 g/dL (ref 6.5–8.1)

## 2018-09-24 LAB — CBC WITH DIFFERENTIAL/PLATELET
Abs Immature Granulocytes: 0.01 10*3/uL (ref 0.00–0.07)
Basophils Absolute: 0 10*3/uL (ref 0.0–0.1)
Basophils Relative: 1 %
Eosinophils Absolute: 0.1 10*3/uL (ref 0.0–0.5)
Eosinophils Relative: 1 %
HCT: 44.3 % (ref 39.0–52.0)
Hemoglobin: 14.3 g/dL (ref 13.0–17.0)
Immature Granulocytes: 0 %
Lymphocytes Relative: 18 %
Lymphs Abs: 1.1 10*3/uL (ref 0.7–4.0)
MCH: 32.8 pg (ref 26.0–34.0)
MCHC: 32.3 g/dL (ref 30.0–36.0)
MCV: 101.6 fL — ABNORMAL HIGH (ref 80.0–100.0)
Monocytes Absolute: 0.7 10*3/uL (ref 0.1–1.0)
Monocytes Relative: 11 %
Neutro Abs: 4.1 10*3/uL (ref 1.7–7.7)
Neutrophils Relative %: 69 %
Platelets: 184 10*3/uL (ref 150–400)
RBC: 4.36 MIL/uL (ref 4.22–5.81)
RDW: 16 % — ABNORMAL HIGH (ref 11.5–15.5)
WBC: 6 10*3/uL (ref 4.0–10.5)
nRBC: 0 % (ref 0.0–0.2)

## 2018-09-24 LAB — URINALYSIS, DIPSTICK ONLY
Bilirubin Urine: NEGATIVE
Glucose, UA: NEGATIVE mg/dL
Hgb urine dipstick: NEGATIVE
Ketones, ur: NEGATIVE mg/dL
Leukocytes,Ua: NEGATIVE
Nitrite: NEGATIVE
Protein, ur: NEGATIVE mg/dL
Specific Gravity, Urine: 1.015 (ref 1.005–1.030)
pH: 5 (ref 5.0–8.0)

## 2018-09-24 MED ORDER — SODIUM CHLORIDE 0.9 % IV SOLN
4.8000 mg/kg | Freq: Once | INTRAVENOUS | Status: AC
  Administered 2018-09-24: 500 mg via INTRAVENOUS
  Filled 2018-09-24: qty 16

## 2018-09-24 MED ORDER — SODIUM CHLORIDE 0.9 % IV SOLN
10.0000 mg | Freq: Once | INTRAVENOUS | Status: AC
  Administered 2018-09-24: 10:00:00 10 mg via INTRAVENOUS
  Filled 2018-09-24: qty 10

## 2018-09-24 MED ORDER — ATROPINE SULFATE 1 MG/ML IJ SOLN
0.5000 mg | Freq: Once | INTRAMUSCULAR | Status: AC
  Administered 2018-09-24: 10:00:00 0.5 mg via INTRAVENOUS
  Filled 2018-09-24: qty 1

## 2018-09-24 MED ORDER — SODIUM CHLORIDE 0.9 % IV SOLN
Freq: Once | INTRAVENOUS | Status: AC
  Administered 2018-09-24: 10:00:00 via INTRAVENOUS

## 2018-09-24 MED ORDER — FLUOROURACIL CHEMO INJECTION 2.5 GM/50ML
400.0000 mg/m2 | Freq: Once | INTRAVENOUS | Status: AC
  Administered 2018-09-24: 12:00:00 950 mg via INTRAVENOUS
  Filled 2018-09-24: qty 19

## 2018-09-24 MED ORDER — SODIUM CHLORIDE 0.9 % IV SOLN
2400.0000 mg/m2 | INTRAVENOUS | Status: DC
  Administered 2018-09-24: 12:00:00 5650 mg via INTRAVENOUS
  Filled 2018-09-24: qty 113

## 2018-09-24 MED ORDER — PALONOSETRON HCL INJECTION 0.25 MG/5ML
0.2500 mg | Freq: Once | INTRAVENOUS | Status: AC
  Administered 2018-09-24: 0.25 mg via INTRAVENOUS
  Filled 2018-09-24: qty 5

## 2018-09-24 MED ORDER — SODIUM CHLORIDE 0.9 % IV SOLN
900.0000 mg | Freq: Once | INTRAVENOUS | Status: AC
  Administered 2018-09-24: 900 mg via INTRAVENOUS
  Filled 2018-09-24: qty 10

## 2018-09-24 NOTE — Assessment & Plan Note (Signed)
1.  Metastatic colon cancer, K-ras mutation positive, MS-stable: -Status post FOLFOX for 12 cycles in the adjuvant setting from 03/30/2015-09/06/2015. -PET scan on 11/19/2017 showing peritoneal carcinomatosis, right paratracheal lymph node, nonspecific increased uptake in the right hilar region.  CEA was 12.9 on 11/16/2017. -9 cycles of FOLFIRI from 01/09/2018 through 05/08/2018 with addition of bevacizumab during cycle 5. -Maintenance 5-FU/leucovorin and bevacizumab started on 05/22/2018. -CEA on 08/13/2018 increased to 10.4 from 5.1 on 06/26/2018. -CT CAP on 08/26/2018 showed stable peritoneal metastasis with peritoneal implants and mesenteric lesions.  No new progressive findings.  No hepatic metastatic disease.  Few scattered tiny lung nodules with no evidence of pulmonary metastasis. - He has tolerated last cycle very well.  We reviewed his labs which were adequate to proceed with next cycle. -I will see him back in 2 weeks for follow-up.  I plan to repeat scans 2 to 3 months from the last scan.  2.  Pulmonary embolism: -Incidental PE on CT chest on 05/20/2018.  CT angiogram confirmed bilateral PE. -He is tolerating Xarelto 20 mg daily without any bleeding issues.  3.  Peripheral neuropathy: -He has pre-existing neuropathy from prior oxaliplatin.  Numbness in the legs has been stable.  4.  Diarrhea: -This is chemotherapy-induced.  This has been stable.  He is continuing Imodium 2 tablets 2-3 times a day.

## 2018-09-24 NOTE — Progress Notes (Signed)
Pt presents today for treatment and f/u appointment with Dr. Delton Coombes. VSS. HR 105 on arrival. Labs pending. MAR reviewed and updated. Pt has no complaints of any changes since the last visit.   HR recheck 99.   Treatment given today per MD orders. Tolerated infusion without adverse affects. Vital signs stable. No complaints at this time. Discharged from clinic ambulatory. F/U with [facility] as scheduled.

## 2018-09-24 NOTE — Patient Instructions (Addendum)
[facility] at [facility] Discharge Instructions  You were seen today by Dr. Katragadda. He went over your recent lab results. He will see you back in 2 weeks for labs and follow up.   Thank you for choosing [facility] at [facility] to provide your oncology and hematology care.  To afford each patient quality time with our provider, please arrive at least 15 minutes before your scheduled appointment time.   If you have a lab appointment with the Cancer Center please come in thru the  Main Entrance and check in at the main information desk  You need to re-schedule your appointment should you arrive 10 or more minutes late.  We strive to give you quality time with our providers, and arriving late affects you and other patients whose appointments are after yours.  Also, if you no show three or more times for appointments you may be dismissed from the clinic at the providers discretion.     Again, thank you for choosing [facility].  Our hope is that these requests will decrease the amount of time that you wait before being seen by our physicians.       _____________________________________________________________  Should you have questions after your visit to [facility], please contact our office at [phone] between the hours of 8:00 a.m. and 4:30 p.m.  Voicemails left after 4:00 p.m. will not be returned until the following business day.  For prescription refill requests, have your pharmacy contact our office and allow 72 hours.    Cancer Center Support Programs:   > Cancer Support Group  2nd Tuesday of the month 1pm-2pm, Journey Room    

## 2018-09-24 NOTE — Patient Instructions (Signed)
[facility] Discharge Instructions for Patients Receiving Chemotherapy  Today you received the following chemotherapy agents   To help prevent nausea and vomiting after your treatment, we encourage you to take your nausea medication   If you develop nausea and vomiting that is not controlled by your nausea medication, call the clinic.   BELOW ARE SYMPTOMS THAT SHOULD BE REPORTED IMMEDIATELY:  *FEVER GREATER THAN 100.5 F  *CHILLS WITH OR WITHOUT FEVER  NAUSEA AND VOMITING THAT IS NOT CONTROLLED WITH YOUR NAUSEA MEDICATION  *UNUSUAL SHORTNESS OF BREATH  *UNUSUAL BRUISING OR BLEEDING  TENDERNESS IN MOUTH AND THROAT WITH OR WITHOUT PRESENCE OF ULCERS  *URINARY PROBLEMS  *BOWEL PROBLEMS  UNUSUAL RASH Items with * indicate a potential emergency and should be followed up as soon as possible.  Feel free to call the clinic should you have any questions or concerns. The clinic phone number is [phone].  Please show the CHEMO ALERT CARD at check-in to the Emergency Department and triage nurse.   

## 2018-09-24 NOTE — Progress Notes (Signed)
[place], [place] [address]   CLINIC:  Medical Oncology/Hematology  PCP:  Patient, No Pcp Per No address on file None   REASON FOR VISIT:  Follow-up for metastatic colon cancer   BRIEF ONCOLOGIC HISTORY:  Oncology History Overview Note  Stage IIIC ([id number]) adenocarcinoma of transverse colon, diagnosed on colonoscopy by Dr. Oneida Alar on 01/12/2015 followed by definitive surgery by Dr. [name] with right hemicolectomy on 02/12/2015.  He then underwent FOLFOX x 12 cycles in the adjuvant setting (03/30/2015- 09/06/2015).   Malignant neoplasm of transverse colon ([place])  01/12/2015 Pathologic Stage   Colon, biopsy, distal transverse - TUBULOVILLOUS ADENOMA WITH HIGH GRADE DYSPLASIA.   01/12/2015 Procedure   Colonoscopy by Dr. Oneida Alar.   01/12/2015 Tumor Marker   CEA: 6.6 (H)    01/18/2015 Imaging   CT abd/pelvis- Apple-core lesion identified in the mid transverse colon without obstruction. No evidence for lymphadenopathy in the gastrohepatic ligament or omentum.  Stable 8 mm hypo attenuating lesion in the left liver, likely a cyst.   02/10/2015 Initial Diagnosis   Adenocarcinoma of transverse colon ([place])   02/12/2015 Definitive Surgery   [name], Extended right hemicolectomy    02/12/2015 Pathology Results   Mucinous adenocarcinoma with penetration of visceral peritoneum, 4/19 lymph nodes for metastatic disease, negative resection margins, with LVI and perineural invasion   03/30/2015 - 09/06/2015 Chemotherapy   FOLFOX x 12 cycles   05/25/2015 Treatment Plan Change   5 FU bolus discontinued for cycle #5   06/08/2015 Treatment Plan Change   Treatment deferred x 1 week   06/15/2015 Treatment Plan Change   5FU CI decreased by 10% and Oxaliplatin reduced by 15% for cycles #6-#11; Oxaliplatin dropped for cycle #12 d/t neuropathy.    10/20/2015 Imaging   CT CAP- Right hemicolectomy without evidence of metastatic disease. 2. Previously  measured ground-glass lesion in the left upper lobe has resolved. 3. Probable food debris in the stomach, simulating gastric wall thickening. Please correlate clinically. 4. 6 mm irregular nodular density in the left upper lobe, stable. Continued attention on followup exams is warranted.   01/25/2016 Procedure   Colonoscopy by Dr. Oneida Alar- Non-thrombosed external hemorrhoids found on digital rectal exam. - One 4 mm polyp in the rectum, removed with a cold biopsy forceps. Resected and retrieved. - Congested mucosa in the neo-terminal ileum. Biopsied. - Redundant colon. - Internal hemorrhoids.   01/26/2016 Pathology Results   1. Terminal ileum, biopsy - MILD ACUTE (ACTIVE) ILEITIS. - NO DYSPLASIA OR MALIGNANCY IDENTIFIED - SEE COMMENT. 2. Rectum, polyp(s) - HYPERPLASTIC POLYP (X 1). - NO DYSPLASIA OR MALIGNANCY IDENTIFIED.   04/13/2016 Imaging   CT chest- Stable CT chest. 6 mm irregular nodule anterior left upper lobe is stable. The scattered areas of peribronchovascular micro nodularity in the lungs bilaterally are unchanged.   10/19/2016 Imaging   CT CAP: 1. Status post right hemicolectomy. No findings to suggest metastatic disease in the abdomen or pelvis. 2. Aortic atherosclerosis. 3. Additional incidental findings, as above. Aortic Atherosclerosis (ICD10-I70.0).   01/09/2018 -  Chemotherapy   The patient had palonosetron (ALOXI) injection 0.25 mg, 0.25 mg, Intravenous,  Once, 18 of 23 cycles Administration: 0.25 mg (01/09/2018), 0.25 mg (01/23/2018), 0.25 mg (02/11/2018), 0.25 mg (02/25/2018), 0.25 mg (03/12/2018), 0.25 mg (03/27/2018), 0.25 mg (04/10/2018), 0.25 mg (04/24/2018), 0.25 mg (05/08/2018), 0.25 mg (05/22/2018), 0.25 mg (06/10/2018), 0.25 mg (06/26/2018), 0.25 mg (07/10/2018), 0.25 mg (07/24/2018), 0.25 mg (08/13/2018), 0.25 mg (08/27/2018), 0.25 mg (  09/10/2018), 0.25 mg (09/24/2018) irinotecan (CAMPTOSAR) 420 mg in dextrose 5 % 500 mL chemo infusion, 180 mg/m2 = 420 mg, Intravenous,  Once, 9  of 9 cycles Administration: 420 mg (01/09/2018), 420 mg (01/23/2018), 420 mg (02/11/2018), 420 mg (02/25/2018), 420 mg (03/12/2018), 420 mg (03/27/2018), 420 mg (04/10/2018), 420 mg (04/24/2018), 420 mg (05/08/2018) leucovorin 900 mg in dextrose 5 % 250 mL infusion, 944 mg, Intravenous,  Once, 18 of 23 cycles Administration: 900 mg (01/09/2018), 900 mg (01/23/2018), 900 mg (02/11/2018), 900 mg (02/25/2018), 900 mg (03/12/2018), 900 mg (03/27/2018), 900 mg (04/10/2018), 900 mg (04/24/2018), 900 mg (05/08/2018), 900 mg (06/10/2018), 900 mg (06/26/2018), 900 mg (07/10/2018), 900 mg (07/24/2018), 900 mg (08/13/2018), 900 mg (08/27/2018), 900 mg (09/10/2018), 900 mg (09/24/2018) fluorouracil (ADRUCIL) chemo injection 950 mg, 400 mg/m2 = 950 mg, Intravenous,  Once, 18 of 23 cycles Administration: 950 mg (01/09/2018), 950 mg (01/23/2018), 950 mg (02/11/2018), 950 mg (02/25/2018), 950 mg (03/12/2018), 950 mg (03/27/2018), 950 mg (04/10/2018), 950 mg (04/24/2018), 950 mg (05/08/2018), 950 mg (05/22/2018), 950 mg (06/10/2018), 950 mg (06/26/2018), 950 mg (07/10/2018), 950 mg (07/24/2018), 950 mg (08/13/2018), 950 mg (08/27/2018), 950 mg (09/10/2018), 950 mg (09/24/2018) fluorouracil (ADRUCIL) 5,650 mg in sodium chloride 0.9 % 137 mL chemo infusion, 2,400 mg/m2 = 5,650 mg, Intravenous, 1 Day/Dose, 18 of 23 cycles Administration: 5,650 mg (01/09/2018), 5,650 mg (01/23/2018), 5,650 mg (02/11/2018), 5,650 mg (02/25/2018), 5,650 mg (03/12/2018), 5,650 mg (03/27/2018), 5,650 mg (04/10/2018), 5,650 mg (04/24/2018), 5,650 mg (05/08/2018), 5,650 mg (05/22/2018), 5,650 mg (06/10/2018), 5,650 mg (06/26/2018), 5,650 mg (07/10/2018), 5,650 mg (07/24/2018), 5,650 mg (08/13/2018), 5,650 mg (08/27/2018), 5,650 mg (09/10/2018), 5,650 mg (09/24/2018)  for chemotherapy treatment.    01/23/2018 Cancer Staging   Staging form: Colon and Rectum, AJCC 7th Edition - Pathologic: M1 - Signed by Zoila Shutter, MD on 01/23/2018   Malignant neoplasm of overlapping sites of colon ([place])  12/26/2017 Initial Diagnosis   Cancer  of overlapping sites of colon metastatic to intra-abdominal lymph node ([place])   01/09/2018 -  Chemotherapy   The patient had palonosetron (ALOXI) injection 0.25 mg, 0.25 mg, Intravenous,  Once, 13 of 14 cycles Administration: 0.25 mg (01/09/2018), 0.25 mg (01/23/2018), 0.25 mg (02/11/2018), 0.25 mg (02/25/2018), 0.25 mg (03/12/2018), 0.25 mg (03/27/2018), 0.25 mg (04/10/2018), 0.25 mg (04/24/2018), 0.25 mg (05/08/2018), 0.25 mg (05/22/2018), 0.25 mg (06/10/2018), 0.25 mg (06/26/2018), 0.25 mg (07/10/2018) irinotecan (CAMPTOSAR) 420 mg in dextrose 5 % 500 mL chemo infusion, 180 mg/m2 = 420 mg, Intravenous,  Once, 9 of 9 cycles Administration: 420 mg (01/09/2018), 420 mg (01/23/2018), 420 mg (02/11/2018), 420 mg (02/25/2018), 420 mg (03/12/2018), 420 mg (03/27/2018), 420 mg (04/10/2018), 420 mg (04/24/2018), 420 mg (05/08/2018) leucovorin 900 mg in dextrose 5 % 250 mL infusion, 944 mg, Intravenous,  Once, 13 of 14 cycles Administration: 900 mg (01/09/2018), 900 mg (01/23/2018), 900 mg (02/11/2018), 900 mg (02/25/2018), 900 mg (03/12/2018), 900 mg (03/27/2018), 900 mg (04/10/2018), 900 mg (04/24/2018), 900 mg (05/08/2018), 900 mg (06/10/2018), 900 mg (06/26/2018), 900 mg (07/10/2018) fluorouracil (ADRUCIL) chemo injection 950 mg, 400 mg/m2 = 950 mg, Intravenous,  Once, 13 of 14 cycles Administration: 950 mg (01/09/2018), 950 mg (01/23/2018), 950 mg (02/11/2018), 950 mg (02/25/2018), 950 mg (03/12/2018), 950 mg (03/27/2018), 950 mg (04/10/2018), 950 mg (04/24/2018), 950 mg (05/08/2018), 950 mg (05/22/2018), 950 mg (06/10/2018), 950 mg (06/26/2018), 950 mg (07/10/2018) fluorouracil (ADRUCIL) 5,650 mg in sodium chloride 0.9 % 137 mL chemo infusion, 2,400 mg/m2 = 5,650 mg, Intravenous, 1 Day/Dose, 13 of 14 cycles Administration: 5,650 mg (  01/09/2018), 5,650 mg (01/23/2018), 5,650 mg (02/11/2018), 5,650 mg (02/25/2018), 5,650 mg (03/12/2018), 5,650 mg (03/27/2018), 5,650 mg (04/10/2018), 5,650 mg (04/24/2018), 5,650 mg (05/08/2018), 5,650 mg (05/22/2018), 5,650 mg (06/10/2018), 5,650 mg  (06/26/2018), 5,650 mg (07/10/2018)  for chemotherapy treatment.    Colon cancer metastasized to multiple sites [facility])  01/16/2018 Initial Diagnosis   Colon cancer metastasized to multiple sites [facility])   01/23/2018 - 02/10/2018 Chemotherapy   The patient had panitumumab (VECTIBIX) 600 mg in sodium chloride 0.9 % 100 mL chemo infusion, 640 mg, Intravenous,  Once, 1 of 3 cycles Administration: 600 mg (01/23/2018)  for chemotherapy treatment.    03/12/2018 -  Chemotherapy   The patient had bevacizumab (AVASTIN) 500 mg in sodium chloride 0.9 % 100 mL chemo infusion, 525 mg, Intravenous,  Once, 14 of 22 cycles Administration: 500 mg (03/12/2018), 500 mg (03/27/2018), 500 mg (04/10/2018), 500 mg (04/24/2018), 500 mg (06/10/2018), 500 mg (05/08/2018), 500 mg (05/22/2018), 500 mg (06/26/2018), 500 mg (07/10/2018), 500 mg (07/24/2018), 500 mg (08/13/2018), 500 mg (08/27/2018), 500 mg (09/10/2018), 500 mg (09/24/2018)  for chemotherapy treatment.       CANCER STAGING: Cancer Staging Malignant neoplasm of transverse colon [facility]) Staging form: Colon and Rectum, AJCC 7th Edition - Pathologic stage from 03/04/2015: Stage IIIC (T4a, N2a, cM0) - Signed by Baird Cancer, PA-C on 03/04/2015 - Pathologic: M1 - Signed by Zoila Shutter, MD on 01/23/2018    INTERVAL HISTORY:  Erik [name] 63 y.o. male seen for follow-up of metastatic colon cancer and next cycle of chemotherapy.  Last cycle was 2 weeks ago.  Tolerated it very well.  Had minor nosebleeds which stopped spontaneously.  Appetite is 50%.  Energy levels are 25%.  No change in his diarrhea.  He is taking Imodium 2 tablets 2-3 times a day.  Numbness in the feet has also been stable.  Denies any nausea or vomiting.  No fevers or infections reported.  REVIEW OF SYSTEMS:  Review of Systems  HENT:   Positive for nosebleeds.   Gastrointestinal: Positive for diarrhea.  Neurological: Positive for numbness.  All other systems reviewed and are negative.    PAST MEDICAL/SURGICAL  HISTORY:  Past Medical History:  Diagnosis Date   Abnormal stress echocardiogram    Adenocarcinoma of transverse colon ([place]) 02/10/2015   Alcohol abuse    Heavy Use up until 2010   Anxiety    Blood transfusion without reported diagnosis    Cancer of overlapping sites of colon metastatic to intra-abdominal lymph node ([place]) 12/26/2017   COPD (chronic obstructive pulmonary disease) ([facility])    Depression    Emphysema of lung ([facility])    GERD (gastroesophageal reflux disease)    Head trauma 2001   closed head injury; coma for 4 weeks   Hypercholesterolemia    Hypertension    ING HERN W/GANGREN RECUR UNILAT/UNSPEC ING HERN 04/21/2009   Qualifier: Diagnosis of  By: Verl Blalock, MD, [place] [facility]    PTSD (post-traumatic stress disorder)    Pulmonary nodule 06/14/2016   SAH (subarachnoid hemorrhage) ([place]) 08/31/2012   SDH (subdural hematoma) ([place]) 08/31/2012   Past Surgical History:  Procedure Laterality Date   BIOPSY  01/25/2016   Procedure: BIOPSY;  Surgeon: Danie Binder, MD;  [facility];  Service: Endoscopy;;  ileum;    cardiac cath     COLONOSCOPY  2011   Dr. Oneida Alar: multiple adenomas and hyperplastic polyps   COLONOSCOPY WITH PROPOFOL N/A 01/12/2015   Procedure: COLONOSCOPY WITH PROPOFOL;  Surgeon: Carlyon Prows  Rexene Edison, MD;  Location: [facility];  Service: Endoscopy;  Laterality: N/A;  1030   COLONOSCOPY WITH PROPOFOL N/A 01/25/2016   Procedure: COLONOSCOPY WITH PROPOFOL;  Surgeon: Danie Binder, MD;  Location[facility];  Service: Endoscopy;  Laterality: N/A;  [place]   ESOPHAGOGASTRODUODENOSCOPY (EGD) WITH PROPOFOL N/A 01/12/2015   Procedure: ESOPHAGOGASTRODUODENOSCOPY (EGD) WITH PROPOFOL;  Surgeon: Danie Binder, MD;  [facility];  Service: Endoscopy;  Laterality: N/A;   head injury surgery     HERNIA REPAIR Right 2012   Inguinal- [facility]   KIDNEY SURGERY     >30 years ago   LAPAROSCOPIC RIGHT HEMI COLECTOMY Left  02/10/2015   Procedure: LAPAROSCOPIC THEN OPEN RIGHT HEMI COLECTOMY;  Surgeon: [name], MD;  Location: [facility] ORS;  Service: General;  Laterality: Left;   LAPAROTOMY N/A 12/19/2017   Procedure: EXPLORATORY LAPAROTOMY;  Surgeon: Aviva Signs, MD;  Location: [facility];  Service: General;  Laterality: N/A;   POLYPECTOMY  01/25/2016   Procedure: POLYPECTOMY;  Surgeon: Danie Binder, MD;  [facility];  Service: Endoscopy;;  colon   PORT-A-CATH REMOVAL N/A 02/26/2017   Procedure: MINOR REMOVAL PORT-A-CATH;  Surgeon: Aviva Signs, MD;  Location: [facility];  Service: General;  Laterality: N/A;  Pt to arrive at [place] N/A 03/24/2015   Procedure: [place];  Surgeon: Jules Husbands, MD;  Location: [facility] ORS;  Service: General;  Laterality: N/A;   PORTACATH PLACEMENT Left 01/07/2018   Procedure: INSERTION PORT A CATH (ATTACHED CATHETER IN LEFT SUBCLAVIAN);  Surgeon: Aviva Signs, MD;  Location: [facility];  Service: General;  Laterality: Left;     SOCIAL HISTORY:  Social History   Socioeconomic History   Marital status: Divorced    Spouse name: Not on file   Number of children: 2   Years of education: 12   Highest education level: Not on file  Occupational History   Occupation: disabled/unemployed   Occupation: unemployed/Mediicaid only    Comment: NO INCOME  Social Designer, fashion/clothing strain: Very hard   Food insecurity    Worry: Often true    Inability: Often true   Transportation needs    Medical: Yes    Non-medical: No  Tobacco Use   Smoking status: Former Smoker    Packs/day: 0.10    Years: 30.00    Pack years: 3.00    Types: Cigarettes    Start date: 02/06/1973    Quit date: 12/09/2017    Years since quitting: 0.7   Smokeless tobacco: Never Used   Tobacco comment: Quit on 12/09/2017  Substance and Sexual Activity   Alcohol use: Yes    Alcohol/week: 0.0 standard drinks    Comment: beer occ, history of ETOH abuse  in remote past.    Drug use: No   Sexual activity: Never    Partners: Female    Birth control/protection: None  Lifestyle   Physical activity    Days per week: Not on file    Minutes per session: Not on file   Stress: Not on file  Relationships   Social connections    Talks on phone: Not on file    Gets together: Not on file    Attends religious service: Not on file    Active member of club or organization: Not on file    Attends meetings of clubs or organizations: Not on file    Relationship status:  Not on file   Intimate partner violence    Fear of current or ex partner: Not on file    Emotionally abused: Not on file    Physically abused: Not on file    Forced sexual activity: Not on file  Other Topics Concern   Not on file  Social History Narrative   Single   Lives alone   Turned down for disability    FAMILY HISTORY:  Family History  Problem Relation Age of Onset   Pulmonary embolism Mother    Breast cancer Mother    Cancer Mother        breast cancer   Arthritis Mother    Heart disease Father    Cancer Father 6       Leukemia   Cancer Paternal Grandfather        Lung   Ataxia Neg Hx    Chorea Neg Hx    Dementia Neg Hx    Mental retardation Neg Hx    Migraines Neg Hx    Multiple sclerosis Neg Hx    Neurofibromatosis Neg Hx    Neuropathy Neg Hx    Parkinsonism Neg Hx    Seizures Neg Hx    Stroke Neg Hx    Colon cancer Neg Hx     CURRENT MEDICATIONS:  Outpatient Encounter Medications as of 09/24/2018  Medication Sig Note   amitriptyline (ELAVIL) 25 MG tablet Take 1 tablet (25 mg total) by mouth at bedtime.    atorvastatin (LIPITOR) 20 MG tablet TAKE 1 TABLET BY MOUTH AT BEDTIME    buPROPion (WELLBUTRIN SR) 150 MG 12 hr tablet Take 150 mg by mouth every morning.     busPIRone (BUSPAR) 10 MG tablet Take 10 mg by mouth 2 (two) times daily.    fluorouracil CALGB 83419 in sodium chloride 0.9 % 150 mL Inject 5,650 mg into the  vein. Over 46 hours    FLUoxetine (PROZAC) 20 MG capsule TAKE 1 CAPSULE BY MOUTH 2 TIMES A DAY (Patient taking differently: Take 20 mg by mouth 2 (two) times daily. ) 06/10/2018: 1 tab TID   gabapentin (NEURONTIN) 300 MG capsule TAKE 1 CAPSULE BY MOUTH THREE TIMES A DAY    IRINOTECAN HCL IV Inject 420 mg into the vein every 14 (fourteen) days.    LEUCOVORIN CALCIUM IV Inject 944 mg into the vein every 14 (fourteen) days.    lidocaine-prilocaine (EMLA) cream APPLY A SMALL AMOUNT OVER PORT SITE AND COVER WITH PLASTIC WRAP ONE HOUR PRIOR TO APPOINTMENT    loperamide (IMODIUM) 2 MG capsule 2 tablets twice daily    omeprazole (PRILOSEC) 20 MG capsule Take 20 mg by mouth every morning.     ondansetron (ZOFRAN) 8 MG tablet Take 1 tablet (8 mg total) by mouth 2 (two) times daily as needed for refractory nausea / vomiting. Start on day 3 after chemotherapy.    PROAIR HFA 108 (90 Base) MCG/ACT inhaler INHALE 2 PUFFS BY MOUTH EVERY 6 HOURS AS NEEDED FOR SHORTNESS OF BREATH/WHEEZING.    prochlorperazine (COMPAZINE) 10 MG tablet Take 1 tablet (10 mg total) by mouth every 6 (six) hours as needed (NAUSEA).    rivaroxaban (XARELTO) 20 MG TABS tablet Take 1 tablet (20 mg total) by mouth daily with supper.    No facility-administered encounter medications on file as of 09/24/2018.     ALLERGIES:  No Known Allergies   PHYSICAL EXAM:  ECOG Performance status: 1  Vitals:   09/24/18 0838  BP: Marland Kitchen)  152/87  Pulse: (!) 105  Resp: 18  Temp: (!) 97.3 F (36.3 C)  SpO2: 95%   Filed Weights   09/24/18 0838  Weight: 224 lb 9.6 oz (101.9 kg)    Physical Exam Vitals signs reviewed.  Constitutional:      Appearance: Normal appearance.  Cardiovascular:     Rate and Rhythm: Normal rate and regular rhythm.     Heart sounds: Normal heart sounds.  Pulmonary:     Effort: Pulmonary effort is normal.     Breath sounds: Normal breath sounds.  Abdominal:     General: There is no distension.      Palpations: Abdomen is soft. There is no mass.  Musculoskeletal:        General: No swelling.  Skin:    General: Skin is warm.  Neurological:     General: No focal deficit present.     Mental Status: He is alert and oriented to person, place, and time.  Psychiatric:        Mood and Affect: Mood normal.        Behavior: Behavior normal.      LABORATORY DATA:  I have reviewed the labs as listed.  CBC    Component Value Date/Time   WBC 6.0 09/24/2018 0837   RBC 4.36 09/24/2018 0837   HGB 14.3 09/24/2018 0837   HCT 44.3 09/24/2018 0837   PLT 184 09/24/2018 0837   MCV 101.6 (H) 09/24/2018 0837   MCH 32.8 09/24/2018 0837   MCHC 32.3 09/24/2018 0837   RDW 16.0 (H) 09/24/2018 0837   LYMPHSABS 1.1 09/24/2018 0837   MONOABS 0.7 09/24/2018 0837   EOSABS 0.1 09/24/2018 0837   BASOSABS 0.0 09/24/2018 0837   CMP Latest Ref Rng & Units 09/24/2018 09/10/2018 08/27/2018  Glucose 70 - 99 mg/dL 108(H) 102(H) 107(H)  BUN 6 - 20 mg/dL 6 8 5(L)  Creatinine 0.61 - 1.24 mg/dL 0.82 0.86 0.92  Sodium 135 - 145 mmol/L 135 138 135  Potassium 3.5 - 5.1 mmol/L 4.2 4.6 4.6  Chloride 98 - 111 mmol/L 101 101 98  CO2 22 - 32 mmol/L _0 Calcium 8.9 - 10.3 mg/dL 8.9 8.9 9.2  Total Protein 6.5 - 8.1 g/dL 6.9 6.7 6.8  Total Bilirubin 0.3 - 1.2 mg/dL 0.4 0.5 0.6  Alkaline Phos 38 - 126 U/L 94 95 95  AST 15 - 41 U/L _1 ALT 0 - 44 U/L _2 DIAGNOSTIC IMAGING:  I have independently reviewed the scans and discussed with the patient.   I have reviewed Venita Lick LPN's note and agree with the documentation.  I personally performed a face-to-face visit, made revisions and my assessment and plan is as follows.    ASSESSMENT & PLAN:   Malignant neoplasm of transverse colon ([place]) 1.  Metastatic colon cancer, K-ras mutation positive, MS-stable: -Status post FOLFOX for 12 cycles in the adjuvant setting from 03/30/2015-09/06/2015. -PET scan on 11/19/2017 showing peritoneal  carcinomatosis, right paratracheal lymph node, nonspecific increased uptake in the right hilar region.  CEA was 12.9 on 11/16/2017. -9 cycles of FOLFIRI from 01/09/2018 through 05/08/2018 with addition of bevacizumab during cycle 5. -Maintenance 5-FU/leucovorin and bevacizumab started on 05/22/2018. -CEA on 08/13/2018 increased to 10.4 from 5.1 on 06/26/2018. -CT CAP on 08/26/2018 showed stable peritoneal metastasis with peritoneal implants and mesenteric lesions.  No new progressive findings.  No hepatic metastatic disease.  Few scattered tiny lung nodules with  no evidence of pulmonary metastasis. - He has tolerated last cycle very well.  We reviewed his labs which were adequate to proceed with next cycle. -I will see him back in 2 weeks for follow-up.  I plan to repeat scans 2 to 3 months from the last scan.  2.  Pulmonary embolism: -Incidental PE on CT chest on 05/20/2018.  CT angiogram confirmed bilateral PE. -He is tolerating Xarelto 20 mg daily without any bleeding issues.  3.  Peripheral neuropathy: -He has pre-existing neuropathy from prior oxaliplatin.  Numbness in the legs has been stable.  4.  Diarrhea: -This is chemotherapy-induced.  This has been stable.  He is continuing Imodium 2 tablets 2-3 times a day.  Total time spent is 25 minutes with more than 50% of the time spent face-to-face discussing treatment plan and coordination of care.    Orders placed this encounter:  No orders of the defined types were placed in this encounter.     Derek Jack, MD [place] [phone]

## 2018-09-25 LAB — CEA: CEA: 8.2 ng/mL — ABNORMAL HIGH (ref 0.0–4.7)

## 2018-09-26 ENCOUNTER — Inpatient Hospital Stay (HOSPITAL_COMMUNITY): Payer: Medicaid Other

## 2018-09-26 ENCOUNTER — Other Ambulatory Visit: Payer: Self-pay

## 2018-09-26 ENCOUNTER — Encounter (HOSPITAL_COMMUNITY): Payer: Self-pay

## 2018-09-26 VITALS — BP 121/73 | HR 94 | Temp 97.7°F | Resp 18

## 2018-09-26 DIAGNOSIS — C184 Malignant neoplasm of transverse colon: Secondary | ICD-10-CM

## 2018-09-26 DIAGNOSIS — Z5111 Encounter for antineoplastic chemotherapy: Secondary | ICD-10-CM | POA: Diagnosis not present

## 2018-09-26 DIAGNOSIS — C188 Malignant neoplasm of overlapping sites of colon: Secondary | ICD-10-CM

## 2018-09-26 MED ORDER — SODIUM CHLORIDE 0.9% FLUSH
10.0000 mL | INTRAVENOUS | Status: DC | PRN
  Administered 2018-09-26: 10 mL
  Filled 2018-09-26: qty 10

## 2018-09-26 MED ORDER — HEPARIN SOD (PORK) LOCK FLUSH 100 UNIT/ML IV SOLN
500.0000 [IU] | Freq: Once | INTRAVENOUS | Status: AC | PRN
  Administered 2018-09-26: 500 [IU]

## 2018-09-26 NOTE — Progress Notes (Signed)
Patients port flushed without difficulty.  Good blood return noted before and after flush. No complaints of pain with flush and no bruising or swelling noted at site.  Band aid applied.  VSS with discharge and left ambulatory with no s/s of distress noted.  

## 2018-10-08 ENCOUNTER — Encounter (HOSPITAL_COMMUNITY): Payer: Self-pay

## 2018-10-08 ENCOUNTER — Inpatient Hospital Stay (HOSPITAL_COMMUNITY): Payer: Medicaid Other | Attending: Hematology

## 2018-10-08 ENCOUNTER — Inpatient Hospital Stay (HOSPITAL_COMMUNITY): Payer: Medicaid Other

## 2018-10-08 ENCOUNTER — Inpatient Hospital Stay (HOSPITAL_BASED_OUTPATIENT_CLINIC_OR_DEPARTMENT_OTHER): Payer: Medicaid Other | Admitting: Hematology

## 2018-10-08 ENCOUNTER — Encounter (HOSPITAL_COMMUNITY): Payer: Self-pay | Admitting: Hematology

## 2018-10-08 ENCOUNTER — Other Ambulatory Visit: Payer: Self-pay

## 2018-10-08 VITALS — BP 130/72 | HR 96 | Temp 98.3°F | Resp 18

## 2018-10-08 VITALS — BP 141/78 | HR 95 | Temp 97.5°F | Resp 18 | Wt 227.8 lb

## 2018-10-08 DIAGNOSIS — C184 Malignant neoplasm of transverse colon: Secondary | ICD-10-CM

## 2018-10-08 DIAGNOSIS — Z5111 Encounter for antineoplastic chemotherapy: Secondary | ICD-10-CM | POA: Insufficient documentation

## 2018-10-08 DIAGNOSIS — C188 Malignant neoplasm of overlapping sites of colon: Secondary | ICD-10-CM

## 2018-10-08 DIAGNOSIS — E538 Deficiency of other specified B group vitamins: Secondary | ICD-10-CM

## 2018-10-08 DIAGNOSIS — C189 Malignant neoplasm of colon, unspecified: Secondary | ICD-10-CM

## 2018-10-08 DIAGNOSIS — Z5112 Encounter for antineoplastic immunotherapy: Secondary | ICD-10-CM | POA: Insufficient documentation

## 2018-10-08 DIAGNOSIS — C786 Secondary malignant neoplasm of retroperitoneum and peritoneum: Secondary | ICD-10-CM | POA: Insufficient documentation

## 2018-10-08 LAB — COMPREHENSIVE METABOLIC PANEL
ALT: 13 U/L (ref 0–44)
AST: 18 U/L (ref 15–41)
Albumin: 3.4 g/dL — ABNORMAL LOW (ref 3.5–5.0)
Alkaline Phosphatase: 79 U/L (ref 38–126)
Anion gap: 9 (ref 5–15)
BUN: 9 mg/dL (ref 6–20)
CO2: 27 mmol/L (ref 22–32)
Calcium: 8.8 mg/dL — ABNORMAL LOW (ref 8.9–10.3)
Chloride: 101 mmol/L (ref 98–111)
Creatinine, Ser: 0.84 mg/dL (ref 0.61–1.24)
GFR calc Af Amer: >60 mL/min (ref >60)
GFR calc non Af Amer: >60 mL/min (ref >60)
Glucose, Bld: 109 mg/dL — ABNORMAL HIGH (ref 70–99)
Potassium: 4.1 mmol/L (ref 3.5–5.1)
Sodium: 137 mmol/L (ref 135–145)
Total Bilirubin: 0.6 mg/dL (ref 0.3–1.2)
Total Protein: 6.4 g/dL — ABNORMAL LOW (ref 6.5–8.1)

## 2018-10-08 LAB — CBC WITH DIFFERENTIAL/PLATELET
Abs Immature Granulocytes: 0.01 10*3/uL (ref 0.00–0.07)
Basophils Absolute: 0.1 10*3/uL (ref 0.0–0.1)
Basophils Relative: 1 %
Eosinophils Absolute: 0.1 10*3/uL (ref 0.0–0.5)
Eosinophils Relative: 1 %
HCT: 44.5 % (ref 39.0–52.0)
Hemoglobin: 13.9 g/dL (ref 13.0–17.0)
Immature Granulocytes: 0 %
Lymphocytes Relative: 19 %
Lymphs Abs: 1 10*3/uL (ref 0.7–4.0)
MCH: 32.6 pg (ref 26.0–34.0)
MCHC: 31.2 g/dL (ref 30.0–36.0)
MCV: 104.2 fL — ABNORMAL HIGH (ref 80.0–100.0)
Monocytes Absolute: 0.6 10*3/uL (ref 0.1–1.0)
Monocytes Relative: 12 %
Neutro Abs: 3.3 10*3/uL (ref 1.7–7.7)
Neutrophils Relative %: 67 %
Platelets: 189 10*3/uL (ref 150–400)
RBC: 4.27 MIL/uL (ref 4.22–5.81)
RDW: 15.9 % — ABNORMAL HIGH (ref 11.5–15.5)
WBC: 5 10*3/uL (ref 4.0–10.5)
nRBC: 0 % (ref 0.0–0.2)

## 2018-10-08 MED ORDER — FLUOROURACIL CHEMO INJECTION 2.5 GM/50ML
400.0000 mg/m2 | Freq: Once | INTRAVENOUS | Status: AC
  Administered 2018-10-08: 12:00:00 950 mg via INTRAVENOUS
  Filled 2018-10-08: qty 19

## 2018-10-08 MED ORDER — SODIUM CHLORIDE 0.9 % IV SOLN
900.0000 mg | Freq: Once | INTRAVENOUS | Status: AC
  Administered 2018-10-08: 12:00:00 900 mg via INTRAVENOUS
  Filled 2018-10-08: qty 45

## 2018-10-08 MED ORDER — CYANOCOBALAMIN 1000 MCG/ML IJ SOLN
1000.0000 ug | Freq: Once | INTRAMUSCULAR | Status: AC
  Administered 2018-10-08: 12:00:00 1000 ug via INTRAMUSCULAR
  Filled 2018-10-08: qty 1

## 2018-10-08 MED ORDER — ATROPINE SULFATE 1 MG/ML IJ SOLN
0.5000 mg | Freq: Once | INTRAMUSCULAR | Status: AC
  Administered 2018-10-08: 0.5 mg via INTRAVENOUS
  Filled 2018-10-08: qty 1

## 2018-10-08 MED ORDER — SODIUM CHLORIDE 0.9 % IV SOLN
Freq: Once | INTRAVENOUS | Status: AC
  Administered 2018-10-08: 10:00:00 via INTRAVENOUS

## 2018-10-08 MED ORDER — SODIUM CHLORIDE 0.9 % IV SOLN
4.8000 mg/kg | Freq: Once | INTRAVENOUS | Status: AC
  Administered 2018-10-08: 11:00:00 500 mg via INTRAVENOUS
  Filled 2018-10-08: qty 4

## 2018-10-08 MED ORDER — PALONOSETRON HCL INJECTION 0.25 MG/5ML
0.2500 mg | Freq: Once | INTRAVENOUS | Status: AC
  Administered 2018-10-08: 10:00:00 0.25 mg via INTRAVENOUS
  Filled 2018-10-08: qty 5

## 2018-10-08 MED ORDER — SODIUM CHLORIDE 0.9% FLUSH: 10.0000 mL | INTRAVENOUS | Status: DC | PRN

## 2018-10-08 MED ORDER — SODIUM CHLORIDE 0.9 % IV SOLN
10.0000 mg | Freq: Once | INTRAVENOUS | Status: AC
  Administered 2018-10-08: 10:00:00 10 mg via INTRAVENOUS
  Filled 2018-10-08: qty 10

## 2018-10-08 MED ORDER — SODIUM CHLORIDE 0.9 % IV SOLN
2400.0000 mg/m2 | INTRAVENOUS | Status: DC
  Administered 2018-10-08: 5650 mg via INTRAVENOUS
  Filled 2018-10-08: qty 100

## 2018-10-08 NOTE — Patient Instructions (Signed)
[facility] Discharge Instructions for Patients Receiving Chemotherapy  Today you received the following chemotherapy agents   To help prevent nausea and vomiting after your treatment, we encourage you to take your nausea medication   If you develop nausea and vomiting that is not controlled by your nausea medication, call the clinic.   BELOW ARE SYMPTOMS THAT SHOULD BE REPORTED IMMEDIATELY:  *FEVER GREATER THAN 100.5 F  *CHILLS WITH OR WITHOUT FEVER  NAUSEA AND VOMITING THAT IS NOT CONTROLLED WITH YOUR NAUSEA MEDICATION  *UNUSUAL SHORTNESS OF BREATH  *UNUSUAL BRUISING OR BLEEDING  TENDERNESS IN MOUTH AND THROAT WITH OR WITHOUT PRESENCE OF ULCERS  *URINARY PROBLEMS  *BOWEL PROBLEMS  UNUSUAL RASH Items with * indicate a potential emergency and should be followed up as soon as possible.  Feel free to call the clinic should you have any questions or concerns. The clinic phone number is [phone].  Please show the CHEMO ALERT CARD at check-in to the Emergency Department and triage nurse.   

## 2018-10-08 NOTE — Progress Notes (Signed)
Erik [name], [place] [address]   CLINIC:  Medical Oncology/Hematology  PCP:  Patient, No Pcp Per No address on file None   REASON FOR VISIT:  Follow-up for metastatic colon cancer   BRIEF ONCOLOGIC HISTORY:  Oncology History Overview Note  Stage IIIC ([id number]) adenocarcinoma of transverse colon, diagnosed on colonoscopy by Dr. Oneida Alar on 01/12/2015 followed by definitive surgery by Dr. [name] with right hemicolectomy on 02/12/2015.  He then underwent FOLFOX x 12 cycles in the adjuvant setting (03/30/2015- 09/06/2015).   Malignant neoplasm of transverse colon ([place])  01/12/2015 Pathologic Stage   Colon, biopsy, distal transverse - TUBULOVILLOUS ADENOMA WITH HIGH GRADE DYSPLASIA.   01/12/2015 Procedure   Colonoscopy by Dr. Oneida Alar.   01/12/2015 Tumor Marker   CEA: 6.6 (H)    01/18/2015 Imaging   CT abd/pelvis- Apple-core lesion identified in the mid transverse colon without obstruction. No evidence for lymphadenopathy in the gastrohepatic ligament or omentum.  Stable 8 mm hypo attenuating lesion in the left liver, likely a cyst.   02/10/2015 Initial Diagnosis   Adenocarcinoma of transverse colon ([place])   02/12/2015 Definitive Surgery   [name], Extended right hemicolectomy    02/12/2015 Pathology Results   Mucinous adenocarcinoma with penetration of visceral peritoneum, 4/19 lymph nodes for metastatic disease, negative resection margins, with LVI and perineural invasion   03/30/2015 - 09/06/2015 Chemotherapy   FOLFOX x 12 cycles   05/25/2015 Treatment Plan Change   5 FU bolus discontinued for cycle #5   06/08/2015 Treatment Plan Change   Treatment deferred x 1 week   06/15/2015 Treatment Plan Change   5FU CI decreased by 10% and Oxaliplatin reduced by 15% for cycles #6-#11; Oxaliplatin dropped for cycle #12 d/t neuropathy.    10/20/2015 Imaging   CT CAP- Right hemicolectomy without evidence of metastatic disease. 2. Previously  measured ground-glass lesion in the left upper lobe has resolved. 3. Probable food debris in the stomach, simulating gastric wall thickening. Please correlate clinically. 4. 6 mm irregular nodular density in the left upper lobe, stable. Continued attention on followup exams is warranted.   01/25/2016 Procedure   Colonoscopy by Dr. Oneida Alar- Non-thrombosed external hemorrhoids found on digital rectal exam. - One 4 mm polyp in the rectum, removed with a cold biopsy forceps. Resected and retrieved. - Congested mucosa in the neo-terminal ileum. Biopsied. - Redundant colon. - Internal hemorrhoids.   01/26/2016 Pathology Results   1. Terminal ileum, biopsy - MILD ACUTE (ACTIVE) ILEITIS. - NO DYSPLASIA OR MALIGNANCY IDENTIFIED - SEE COMMENT. 2. Rectum, polyp(s) - HYPERPLASTIC POLYP (X 1). - NO DYSPLASIA OR MALIGNANCY IDENTIFIED.   04/13/2016 Imaging   CT chest- Stable CT chest. 6 mm irregular nodule anterior left upper lobe is stable. The scattered areas of peribronchovascular micro nodularity in the lungs bilaterally are unchanged.   10/19/2016 Imaging   CT CAP: 1. Status post right hemicolectomy. No findings to suggest metastatic disease in the abdomen or pelvis. 2. Aortic atherosclerosis. 3. Additional incidental findings, as above. Aortic Atherosclerosis (ICD10-I70.0).   01/09/2018 -  Chemotherapy   The patient had palonosetron (ALOXI) injection 0.25 mg, 0.25 mg, Intravenous,  Once, 19 of 23 cycles Administration: 0.25 mg (01/09/2018), 0.25 mg (01/23/2018), 0.25 mg (02/11/2018), 0.25 mg (02/25/2018), 0.25 mg (03/12/2018), 0.25 mg (03/27/2018), 0.25 mg (04/10/2018), 0.25 mg (04/24/2018), 0.25 mg (05/08/2018), 0.25 mg (05/22/2018), 0.25 mg (06/10/2018), 0.25 mg (06/26/2018), 0.25 mg (07/10/2018), 0.25 mg (07/24/2018), 0.25 mg (08/13/2018), 0.25 mg (08/27/2018), 0.25 mg (09/10/2018),  0.25 mg (09/24/2018), 0.25 mg (10/08/2018) irinotecan (CAMPTOSAR) 420 mg in dextrose 5 % 500 mL chemo infusion, 180 mg/m2 = 420 mg,  Intravenous,  Once, 9 of 9 cycles Administration: 420 mg (01/09/2018), 420 mg (01/23/2018), 420 mg (02/11/2018), 420 mg (02/25/2018), 420 mg (03/12/2018), 420 mg (03/27/2018), 420 mg (04/10/2018), 420 mg (04/24/2018), 420 mg (05/08/2018) leucovorin 900 mg in dextrose 5 % 250 mL infusion, 944 mg, Intravenous,  Once, 19 of 23 cycles Administration: 900 mg (01/09/2018), 900 mg (01/23/2018), 900 mg (02/11/2018), 900 mg (02/25/2018), 900 mg (03/12/2018), 900 mg (03/27/2018), 900 mg (04/10/2018), 900 mg (04/24/2018), 900 mg (05/08/2018), 900 mg (06/10/2018), 900 mg (06/26/2018), 900 mg (07/10/2018), 900 mg (07/24/2018), 900 mg (08/13/2018), 900 mg (08/27/2018), 900 mg (09/10/2018), 900 mg (09/24/2018), 900 mg (10/08/2018) fluorouracil (ADRUCIL) chemo injection 950 mg, 400 mg/m2 = 950 mg, Intravenous,  Once, 19 of 23 cycles Administration: 950 mg (01/09/2018), 950 mg (01/23/2018), 950 mg (02/11/2018), 950 mg (02/25/2018), 950 mg (03/12/2018), 950 mg (03/27/2018), 950 mg (04/10/2018), 950 mg (04/24/2018), 950 mg (05/08/2018), 950 mg (05/22/2018), 950 mg (06/10/2018), 950 mg (06/26/2018), 950 mg (07/10/2018), 950 mg (07/24/2018), 950 mg (08/13/2018), 950 mg (08/27/2018), 950 mg (09/10/2018), 950 mg (09/24/2018), 950 mg (10/08/2018) fluorouracil (ADRUCIL) 5,650 mg in sodium chloride 0.9 % 137 mL chemo infusion, 2,400 mg/m2 = 5,650 mg, Intravenous, 1 Day/Dose, 19 of 23 cycles Administration: 5,650 mg (01/09/2018), 5,650 mg (01/23/2018), 5,650 mg (02/11/2018), 5,650 mg (02/25/2018), 5,650 mg (03/12/2018), 5,650 mg (03/27/2018), 5,650 mg (04/10/2018), 5,650 mg (04/24/2018), 5,650 mg (05/08/2018), 5,650 mg (05/22/2018), 5,650 mg (06/10/2018), 5,650 mg (06/26/2018), 5,650 mg (07/10/2018), 5,650 mg (07/24/2018), 5,650 mg (08/13/2018), 5,650 mg (08/27/2018), 5,650 mg (09/10/2018), 5,650 mg (09/24/2018), 5,650 mg (10/08/2018)  for chemotherapy treatment.    01/23/2018 Cancer Staging   Staging form: Colon and Rectum, AJCC 7th Edition - Pathologic: M1 - Signed by Zoila Shutter, MD on 01/23/2018   Malignant  neoplasm of overlapping sites of colon ([place])  12/26/2017 Initial Diagnosis   Cancer of overlapping sites of colon metastatic to intra-abdominal lymph node ([place])   01/09/2018 -  Chemotherapy   The patient had palonosetron (ALOXI) injection 0.25 mg, 0.25 mg, Intravenous,  Once, 13 of 14 cycles Administration: 0.25 mg (01/09/2018), 0.25 mg (01/23/2018), 0.25 mg (02/11/2018), 0.25 mg (02/25/2018), 0.25 mg (03/12/2018), 0.25 mg (03/27/2018), 0.25 mg (04/10/2018), 0.25 mg (04/24/2018), 0.25 mg (05/08/2018), 0.25 mg (05/22/2018), 0.25 mg (06/10/2018), 0.25 mg (06/26/2018), 0.25 mg (07/10/2018) irinotecan (CAMPTOSAR) 420 mg in dextrose 5 % 500 mL chemo infusion, 180 mg/m2 = 420 mg, Intravenous,  Once, 9 of 9 cycles Administration: 420 mg (01/09/2018), 420 mg (01/23/2018), 420 mg (02/11/2018), 420 mg (02/25/2018), 420 mg (03/12/2018), 420 mg (03/27/2018), 420 mg (04/10/2018), 420 mg (04/24/2018), 420 mg (05/08/2018) leucovorin 900 mg in dextrose 5 % 250 mL infusion, 944 mg, Intravenous,  Once, 13 of 14 cycles Administration: 900 mg (01/09/2018), 900 mg (01/23/2018), 900 mg (02/11/2018), 900 mg (02/25/2018), 900 mg (03/12/2018), 900 mg (03/27/2018), 900 mg (04/10/2018), 900 mg (04/24/2018), 900 mg (05/08/2018), 900 mg (06/10/2018), 900 mg (06/26/2018), 900 mg (07/10/2018) fluorouracil (ADRUCIL) chemo injection 950 mg, 400 mg/m2 = 950 mg, Intravenous,  Once, 13 of 14 cycles Administration: 950 mg (01/09/2018), 950 mg (01/23/2018), 950 mg (02/11/2018), 950 mg (02/25/2018), 950 mg (03/12/2018), 950 mg (03/27/2018), 950 mg (04/10/2018), 950 mg (04/24/2018), 950 mg (05/08/2018), 950 mg (05/22/2018), 950 mg (06/10/2018), 950 mg (06/26/2018), 950 mg (07/10/2018) fluorouracil (ADRUCIL) 5,650 mg in sodium chloride 0.9 % 137 mL chemo infusion, 2,400 mg/m2 = 5,650  mg, Intravenous, 1 Day/Dose, 13 of 14 cycles Administration: 5,650 mg (01/09/2018), 5,650 mg (01/23/2018), 5,650 mg (02/11/2018), 5,650 mg (02/25/2018), 5,650 mg (03/12/2018), 5,650 mg (03/27/2018), 5,650 mg (04/10/2018), 5,650 mg  (04/24/2018), 5,650 mg (05/08/2018), 5,650 mg (05/22/2018), 5,650 mg (06/10/2018), 5,650 mg (06/26/2018), 5,650 mg (07/10/2018)  for chemotherapy treatment.    Colon cancer metastasized to multiple sites [facility])  01/16/2018 Initial Diagnosis   Colon cancer metastasized to multiple sites [facility])   01/23/2018 - 02/10/2018 Chemotherapy   The patient had panitumumab (VECTIBIX) 600 mg in sodium chloride 0.9 % 100 mL chemo infusion, 640 mg, Intravenous,  Once, 1 of 3 cycles Administration: 600 mg (01/23/2018)  for chemotherapy treatment.    03/12/2018 -  Chemotherapy   The patient had bevacizumab (AVASTIN) 500 mg in sodium chloride 0.9 % 100 mL chemo infusion, 525 mg, Intravenous,  Once, 15 of 22 cycles Administration: 500 mg (03/12/2018), 500 mg (03/27/2018), 500 mg (04/10/2018), 500 mg (04/24/2018), 500 mg (06/10/2018), 500 mg (05/08/2018), 500 mg (05/22/2018), 500 mg (06/26/2018), 500 mg (07/10/2018), 500 mg (07/24/2018), 500 mg (08/13/2018), 500 mg (08/27/2018), 500 mg (09/10/2018), 500 mg (09/24/2018), 500 mg (10/08/2018)  for chemotherapy treatment.       CANCER STAGING: Cancer Staging Malignant neoplasm of transverse colon [facility]) Staging form: Colon and Rectum, AJCC 7th Edition - Pathologic stage from 03/04/2015: Stage IIIC (T4a, N2a, cM0) - Signed by Baird Cancer, PA-C on 03/04/2015 - Pathologic: M1 - Signed by Zoila Shutter, MD on 01/23/2018    INTERVAL HISTORY:  Mr. Gorin 63 y.o. male seen for follow-up of metastatic colon cancer and next cycle of chemotherapy.  Appetite and energy levels are 50%.  He tolerated last cycle very well.  Chronic diarrhea has been stable.  He takes 2 Lomotil twice daily.  Denies any nausea vomiting or constipation.  Had minor nosebleeds which are self-contained.  Numbness in the feet has been stable.  Occasional dizziness is also stable.  Denies any fevers or chills.  No bleeding per rectum or hematuria reported.  REVIEW OF SYSTEMS:  Review of Systems  HENT:   Positive for nosebleeds.     Gastrointestinal: Positive for diarrhea.  Neurological: Positive for numbness.  All other systems reviewed and are negative.    PAST MEDICAL/SURGICAL HISTORY:  Past Medical History:  Diagnosis Date   Abnormal stress echocardiogram    Adenocarcinoma of transverse colon ([place]) 02/10/2015   Alcohol abuse    Heavy Use up until 2010   Anxiety    Blood transfusion without reported diagnosis    Cancer of overlapping sites of colon metastatic to intra-abdominal lymph node ([place]) 12/26/2017   COPD (chronic obstructive pulmonary disease) (H[facility])    Depression    Emphysema of lung ([facility])    GERD (gastroesophageal reflux disease)    Head trauma 2001   closed head injury; coma for 4 weeks   Hypercholesterolemia    Hypertension    ING HERN W/GANGREN RECUR UNILAT/UNSPEC ING HERN 04/21/2009   Qualifier: Diagnosis of  By: Verl Blalock, MD, [place] [facility]    PTSD (post-traumatic stress disorder)    Pulmonary nodule 06/14/2016   SAH (subarachnoid hemorrhage) ([place]) 08/31/2012   SDH (subdural hematoma) ([place]) 08/31/2012   Past Surgical History:  Procedure Laterality Date   BIOPSY  01/25/2016   Procedure: BIOPSY;  Surgeon: Danie Binder, MD;  [facility];  Service: Endoscopy;;  ileum;    cardiac cath     COLONOSCOPY  2011   Dr. Oneida Alar: multiple adenomas and  hyperplastic polyps   COLONOSCOPY WITH PROPOFOL N/A 01/12/2015   Procedure: COLONOSCOPY WITH PROPOFOL;  Surgeon: Danie Binder, MD;  Location: [facility];  Service: Endoscopy;  Laterality: N/A;  1030   COLONOSCOPY WITH PROPOFOL N/A 01/25/2016   Procedure: COLONOSCOPY WITH PROPOFOL;  Surgeon: Danie Binder, MD;  Location: [facility];  Service: Endoscopy;  Laterality: N/A;  [place]   ESOPHAGOGASTRODUODENOSCOPY (EGD) WITH PROPOFOL N/A 01/12/2015   Procedure: ESOPHAGOGASTRODUODENOSCOPY (EGD) WITH PROPOFOL;  Surgeon: Danie Binder, MD;  [facility];  Service: Endoscopy;  Laterality: N/A;    head injury surgery     HERNIA REPAIR Right 2012   Inguinal- [facility]   KIDNEY SURGERY     >30 years ago   LAPAROSCOPIC RIGHT HEMI COLECTOMY Left 02/10/2015   Procedure: LAPAROSCOPIC THEN OPEN RIGHT HEMI COLECTOMY;  Surgeon: [name], MD;  Location: [facility] ORS;  Service: General;  Laterality: Left;   LAPAROTOMY N/A 12/19/2017   Procedure: EXPLORATORY LAPAROTOMY;  Surgeon: Aviva Signs, MD;  Location: [facility];  Service: General;  Laterality: N/A;   POLYPECTOMY  01/25/2016   Procedure: POLYPECTOMY;  Surgeon: Danie Binder, MD;  [facility];  Service: Endoscopy;;  colon   PORT-A-CATH REMOVAL N/A 02/26/2017   Procedure: MINOR REMOVAL PORT-A-CATH;  Surgeon: Aviva Signs, MD;  Location: [facility];  Service: General;  Laterality: N/A;  Pt to arrive at [place] N/A 03/24/2015   Procedure: [place];  Surgeon: Jules Husbands, MD;  Location: [facility] ORS;  Service: General;  Laterality: N/A;   PORTACATH PLACEMENT Left 01/07/2018   Procedure: INSERTION PORT A CATH (ATTACHED CATHETER IN LEFT SUBCLAVIAN);  Surgeon: Aviva Signs, MD;  Location: [facility];  Service: General;  Laterality: Left;     SOCIAL HISTORY:  Social History   Socioeconomic History   Marital status: Divorced    Spouse name: Not on file   Number of children: 2   Years of education: 12   Highest education level: Not on file  Occupational History   Occupation: disabled/unemployed   Occupation: unemployed/Mediicaid only    Comment: NO INCOME  Social Designer, fashion/clothing strain: Very hard   Food insecurity    Worry: Often true    Inability: Often true   Transportation needs    Medical: Yes    Non-medical: No  Tobacco Use   Smoking status: Former Smoker    Packs/day: 0.10    Years: 30.00    Pack years: 3.00    Types: Cigarettes    Start date: 02/06/1973    Quit date: 12/09/2017    Years since quitting: 0.8   Smokeless tobacco: Never Used   Tobacco comment:  Quit on 12/09/2017  Substance and Sexual Activity   Alcohol use: Yes    Alcohol/week: 0.0 standard drinks    Comment: beer occ, history of ETOH abuse in remote past.    Drug use: No   Sexual activity: Never    Partners: Female    Birth control/protection: None  Lifestyle   Physical activity    Days per week: Not on file    Minutes per session: Not on file   Stress: Not on file  Relationships   Social connections    Talks on phone: Not on file    Gets together: Not on file    Attends religious service: Not on file    Active member of club or organization: Not on  file    Attends meetings of clubs or organizations: Not on file    Relationship status: Not on file   Intimate partner violence    Fear of current or ex partner: Not on file    Emotionally abused: Not on file    Physically abused: Not on file    Forced sexual activity: Not on file  Other Topics Concern   Not on file  Social History Narrative   Single   Lives alone   Turned down for disability    FAMILY HISTORY:  Family History  Problem Relation Age of Onset   Pulmonary embolism Mother    Breast cancer Mother    Cancer Mother        breast cancer   Arthritis Mother    Heart disease Father    Cancer Father 49       Leukemia   Cancer Paternal Grandfather        Lung   Ataxia Neg Hx    Chorea Neg Hx    Dementia Neg Hx    Mental retardation Neg Hx    Migraines Neg Hx    Multiple sclerosis Neg Hx    Neurofibromatosis Neg Hx    Neuropathy Neg Hx    Parkinsonism Neg Hx    Seizures Neg Hx    Stroke Neg Hx    Colon cancer Neg Hx     CURRENT MEDICATIONS:  Outpatient Encounter Medications as of 10/08/2018  Medication Sig Note   amitriptyline (ELAVIL) 25 MG tablet Take 1 tablet (25 mg total) by mouth at bedtime.    atorvastatin (LIPITOR) 20 MG tablet TAKE 1 TABLET BY MOUTH AT BEDTIME    buPROPion (WELLBUTRIN SR) 150 MG 12 hr tablet Take 150 mg by mouth every morning.      busPIRone (BUSPAR) 10 MG tablet Take 10 mg by mouth 2 (two) times daily.    fluorouracil CALGB 98921 in sodium chloride 0.9 % 150 mL Inject 5,650 mg into the vein. Over 46 hours    FLUoxetine (PROZAC) 20 MG capsule TAKE 1 CAPSULE BY MOUTH 2 TIMES A DAY (Patient taking differently: Take 20 mg by mouth 2 (two) times daily. ) 06/10/2018: 1 tab TID   gabapentin (NEURONTIN) 300 MG capsule TAKE 1 CAPSULE BY MOUTH THREE TIMES A DAY    IRINOTECAN HCL IV Inject 420 mg into the vein every 14 (fourteen) days.    LEUCOVORIN CALCIUM IV Inject 944 mg into the vein every 14 (fourteen) days.    lidocaine-prilocaine (EMLA) cream APPLY A SMALL AMOUNT OVER PORT SITE AND COVER WITH PLASTIC WRAP ONE HOUR PRIOR TO APPOINTMENT    loperamide (IMODIUM) 2 MG capsule 2 tablets twice daily    omeprazole (PRILOSEC) 20 MG capsule Take 20 mg by mouth every morning.     ondansetron (ZOFRAN) 8 MG tablet Take 1 tablet (8 mg total) by mouth 2 (two) times daily as needed for refractory nausea / vomiting. Start on day 3 after chemotherapy.    PROAIR HFA 108 (90 Base) MCG/ACT inhaler INHALE 2 PUFFS BY MOUTH EVERY 6 HOURS AS NEEDED FOR SHORTNESS OF BREATH/WHEEZING.    prochlorperazine (COMPAZINE) 10 MG tablet Take 1 tablet (10 mg total) by mouth every 6 (six) hours as needed (NAUSEA).    rivaroxaban (XARELTO) 20 MG TABS tablet Take 1 tablet (20 mg total) by mouth daily with supper.    No facility-administered encounter medications on file as of 10/08/2018.     ALLERGIES:  No Known Allergies  PHYSICAL EXAM:  ECOG Performance status: 1  Vitals:   10/08/18 0851  BP: (!) 141/78  Pulse: 95  Resp: 18  Temp: (!) 97.5 F (36.4 C)  SpO2: 100%   Filed Weights   10/08/18 0851  Weight: 227 lb 12.8 oz (103.3 kg)    Physical Exam Vitals signs reviewed.  Constitutional:      Appearance: Normal appearance.  Cardiovascular:     Rate and Rhythm: Normal rate and regular rhythm.     Heart sounds: Normal heart sounds.    Pulmonary:     Effort: Pulmonary effort is normal.     Breath sounds: Normal breath sounds.  Abdominal:     General: There is no distension.     Palpations: Abdomen is soft. There is no mass.  Musculoskeletal:        General: No swelling.  Skin:    General: Skin is warm.  Neurological:     General: No focal deficit present.     Mental Status: He is alert and oriented to person, place, and time.  Psychiatric:        Mood and Affect: Mood normal.        Behavior: Behavior normal.      LABORATORY DATA:  I have reviewed the labs as listed.  CBC    Component Value Date/Time   WBC 5.0 10/08/2018 0827   RBC 4.27 10/08/2018 0827   HGB 13.9 10/08/2018 0827   HCT 44.5 10/08/2018 0827   PLT 189 10/08/2018 0827   MCV 104.2 (H) 10/08/2018 0827   MCH 32.6 10/08/2018 0827   MCHC 31.2 10/08/2018 0827   RDW 15.9 (H) 10/08/2018 0827   LYMPHSABS 1.0 10/08/2018 0827   MONOABS 0.6 10/08/2018 0827   EOSABS 0.1 10/08/2018 0827   BASOSABS 0.1 10/08/2018 0827   CMP Latest Ref Rng & Units 10/08/2018 09/24/2018 09/10/2018  Glucose 70 - 99 mg/dL 109(H) 108(H) 102(H)  BUN 6 - 20 mg/dL '9 6 8  ' Creatinine 0.61 - 1.24 mg/dL 0.84 0.82 0.86  Sodium 135 - 145 mmol/L 137 135 138  Potassium 3.5 - 5.1 mmol/L 4.1 4.2 4.6  Chloride 98 - 111 mmol/L 101 101 101  CO2 22 - 32 mmol/L '27 24 27  ' Calcium 8.9 - 10.3 mg/dL 8.8(L) 8.9 8.9  Total Protein 6.5 - 8.1 g/dL 6.4(L) 6.9 6.7  Total Bilirubin 0.3 - 1.2 mg/dL 0.6 0.4 0.5  Alkaline Phos 38 - 126 U/L 79 94 95  AST 15 - 41 U/L '18 19 19  ' ALT 0 - 44 U/L '13 11 13       ' DIAGNOSTIC IMAGING:  I have independently reviewed the scans and discussed with the patient.   I have reviewed Venita Lick LPN's note and agree with the documentation.  I personally performed a face-to-face visit, made revisions and my assessment and plan is as follows.    ASSESSMENT & PLAN:   Malignant neoplasm of transverse colon ([place]) 1.  Metastatic colon cancer, K-ras mutation  positive, MS-stable: -Status post FOLFOX for 12 cycles in the adjuvant setting from 03/30/2015-09/06/2015. -PET scan on 11/19/2017 showing peritoneal carcinomatosis, right paratracheal lymph node, nonspecific increased uptake in the right hilar region.  CEA was 12.9 on 11/16/2017. -9 cycles of FOLFIRI from 01/09/2018 through 05/08/2018 with addition of bevacizumab during cycle 5. -Maintenance 5-FU/leucovorin and bevacizumab started on 05/22/2018. -CEA on 08/13/2018 increased to 10.4 from 5.1 on 06/26/2018. -CT CAP on 08/26/2018 showed stable peritoneal metastasis with peritoneal implants and mesenteric lesions.  No new progressive findings.  No hepatic metastatic disease.  Few scattered tiny lung nodules with no evidence of pulmonary metastasis. -He tolerated last cycle well.  We reviewed his labs.  He will proceed with his next cycle today. -We will see him back in 2 weeks for follow-up.  We will plan to repeat scans in 2 to 3 months.  2.  Pulmonary embolism: -Incidental PE on CT chest on 05/20/2018.  CT angiogram confirmed bilateral PE. -He is tolerating Xarelto 20 mg daily without any bleeding issues.  3.  Peripheral neuropathy: -He has pre-existing neuropathy from prior oxaliplatin.  Numbness in the legs has been stable.  4.  Diarrhea: -This is chemotherapy-induced and chronic and stable.  He takes Lomotil 2 tablets twice daily.  Total time spent is 25 minutes with more than 50% of the time spent face-to-face discussing treatment plan and coordination of care.    Orders placed this encounter:  Orders Placed This Encounter  Procedures   CBC with Differential/Platelet   Comprehensive metabolic panel   CEA      Derek Jack, MD [place] [phone]

## 2018-10-08 NOTE — Assessment & Plan Note (Signed)
1.  Metastatic colon cancer, K-ras mutation positive, MS-stable: -Status post FOLFOX for 12 cycles in the adjuvant setting from 03/30/2015-09/06/2015. -PET scan on 11/19/2017 showing peritoneal carcinomatosis, right paratracheal lymph node, nonspecific increased uptake in the right hilar region.  CEA was 12.9 on 11/16/2017. -9 cycles of FOLFIRI from 01/09/2018 through 05/08/2018 with addition of bevacizumab during cycle 5. -Maintenance 5-FU/leucovorin and bevacizumab started on 05/22/2018. -CEA on 08/13/2018 increased to 10.4 from 5.1 on 06/26/2018. -CT CAP on 08/26/2018 showed stable peritoneal metastasis with peritoneal implants and mesenteric lesions.  No new progressive findings.  No hepatic metastatic disease.  Few scattered tiny lung nodules with no evidence of pulmonary metastasis. -He tolerated last cycle well.  We reviewed his labs.  He will proceed with his next cycle today. -We will see him back in 2 weeks for follow-up.  We will plan to repeat scans in 2 to 3 months.  2.  Pulmonary embolism: -Incidental PE on CT chest on 05/20/2018.  CT angiogram confirmed bilateral PE. -He is tolerating Xarelto 20 mg daily without any bleeding issues.  3.  Peripheral neuropathy: -He has pre-existing neuropathy from prior oxaliplatin.  Numbness in the legs has been stable.  4.  Diarrhea: -This is chemotherapy-induced and chronic and stable.  He takes Lomotil 2 tablets twice daily.

## 2018-10-08 NOTE — Progress Notes (Signed)
Pt presents today for f/u with Dr. Delton Coombes and treatment. VS within parameters for tx. Labs pending. MAR reviewed and updated. Pt has no complaints of any changes since the last visit. Fatigue and diarrhea is the same. Pt has complaints of dizziness that is from a head injury and pt states that it's the same as it has been.   Message received from [facility] LPN. Okay for treatment pending labs.  Labs reviewed and within parameters for treatment.   Treatment given today per MD orders. Tolerated infusion without adverse affects. Vital signs stable. No complaints at this time. Discharged from clinic ambulatory. F/U with [facility] as scheduled.

## 2018-10-08 NOTE — Patient Instructions (Addendum)
[facility] at [facility] Discharge Instructions  You were seen today by Dr. Katragadda. He went over your recent lab results. He will see you back in 2 weeks for labs and follow up.   Thank you for choosing [facility] at [facility] to provide your oncology and hematology care.  To afford each patient quality time with our provider, please arrive at least 15 minutes before your scheduled appointment time.   If you have a lab appointment with the Cancer Center please come in thru the  Main Entrance and check in at the main information desk  You need to re-schedule your appointment should you arrive 10 or more minutes late.  We strive to give you quality time with our providers, and arriving late affects you and other patients whose appointments are after yours.  Also, if you no show three or more times for appointments you may be dismissed from the clinic at the providers discretion.     Again, thank you for choosing [facility].  Our hope is that these requests will decrease the amount of time that you wait before being seen by our physicians.       _____________________________________________________________  Should you have questions after your visit to [facility], please contact our office at [phone] between the hours of 8:00 a.m. and 4:30 p.m.  Voicemails left after 4:00 p.m. will not be returned until the following business day.  For prescription refill requests, have your pharmacy contact our office and allow 72 hours.    Cancer Center Support Programs:   > Cancer Support Group  2nd Tuesday of the month 1pm-2pm, Journey Room    

## 2018-10-10 ENCOUNTER — Emergency Department (HOSPITAL_COMMUNITY)
Admission: EM | Admit: 2018-10-10 | Discharge: 2018-10-10 | Disposition: A | Discharge status: Home / Self Care | Payer: Medicaid Other | Attending: Emergency Medicine | Admitting: Emergency Medicine

## 2018-10-10 ENCOUNTER — Emergency Department (HOSPITAL_COMMUNITY): Payer: Medicaid Other

## 2018-10-10 ENCOUNTER — Other Ambulatory Visit: Payer: Self-pay

## 2018-10-10 ENCOUNTER — Encounter (HOSPITAL_COMMUNITY): Payer: Self-pay | Admitting: Emergency Medicine

## 2018-10-10 ENCOUNTER — Inpatient Hospital Stay (HOSPITAL_COMMUNITY): Payer: Medicaid Other

## 2018-10-10 VITALS — BP 125/74 | HR 94 | Temp 98.0°F | Resp 18

## 2018-10-10 DIAGNOSIS — J449 Chronic obstructive pulmonary disease, unspecified: Secondary | ICD-10-CM | POA: Insufficient documentation

## 2018-10-10 DIAGNOSIS — Z79899 Other long term (current) drug therapy: Secondary | ICD-10-CM | POA: Diagnosis not present

## 2018-10-10 DIAGNOSIS — S52611A Displaced fracture of right ulna styloid process, initial encounter for closed fracture: Secondary | ICD-10-CM | POA: Insufficient documentation

## 2018-10-10 DIAGNOSIS — Z5111 Encounter for antineoplastic chemotherapy: Secondary | ICD-10-CM | POA: Diagnosis not present

## 2018-10-10 DIAGNOSIS — Y999 Unspecified external cause status: Secondary | ICD-10-CM | POA: Insufficient documentation

## 2018-10-10 DIAGNOSIS — R531 Weakness: Secondary | ICD-10-CM | POA: Insufficient documentation

## 2018-10-10 DIAGNOSIS — S6991XA Unspecified injury of right wrist, hand and finger(s), initial encounter: Secondary | ICD-10-CM | POA: Diagnosis present

## 2018-10-10 DIAGNOSIS — Y929 Unspecified place or not applicable: Secondary | ICD-10-CM | POA: Insufficient documentation

## 2018-10-10 DIAGNOSIS — S62101A Fracture of unspecified carpal bone, right wrist, initial encounter for closed fracture: Secondary | ICD-10-CM

## 2018-10-10 DIAGNOSIS — W19XXXA Unspecified fall, initial encounter: Secondary | ICD-10-CM | POA: Insufficient documentation

## 2018-10-10 DIAGNOSIS — I10 Essential (primary) hypertension: Secondary | ICD-10-CM | POA: Diagnosis not present

## 2018-10-10 DIAGNOSIS — Z85038 Personal history of other malignant neoplasm of large intestine: Secondary | ICD-10-CM | POA: Diagnosis not present

## 2018-10-10 DIAGNOSIS — Z87891 Personal history of nicotine dependence: Secondary | ICD-10-CM | POA: Diagnosis not present

## 2018-10-10 DIAGNOSIS — C184 Malignant neoplasm of transverse colon: Secondary | ICD-10-CM

## 2018-10-10 DIAGNOSIS — Y939 Activity, unspecified: Secondary | ICD-10-CM | POA: Insufficient documentation

## 2018-10-10 DIAGNOSIS — S52501A Unspecified fracture of the lower end of right radius, initial encounter for closed fracture: Secondary | ICD-10-CM | POA: Insufficient documentation

## 2018-10-10 DIAGNOSIS — C188 Malignant neoplasm of overlapping sites of colon: Secondary | ICD-10-CM

## 2018-10-10 LAB — CBC WITH DIFFERENTIAL/PLATELET
Abs Immature Granulocytes: 0.02 10*3/uL (ref 0.00–0.07)
Basophils Absolute: 0 10*3/uL (ref 0.0–0.1)
Basophils Relative: 0 %
Eosinophils Absolute: 0 10*3/uL (ref 0.0–0.5)
Eosinophils Relative: 1 %
HCT: 41.2 % (ref 39.0–52.0)
Hemoglobin: 12.9 g/dL — ABNORMAL LOW (ref 13.0–17.0)
Immature Granulocytes: 0 %
Lymphocytes Relative: 16 %
Lymphs Abs: 1 10*3/uL (ref 0.7–4.0)
MCH: 33 pg (ref 26.0–34.0)
MCHC: 31.3 g/dL (ref 30.0–36.0)
MCV: 105.4 fL — ABNORMAL HIGH (ref 80.0–100.0)
Monocytes Absolute: 0.4 10*3/uL (ref 0.1–1.0)
Monocytes Relative: 7 %
Neutro Abs: 4.8 10*3/uL (ref 1.7–7.7)
Neutrophils Relative %: 76 %
Platelets: 170 10*3/uL (ref 150–400)
RBC: 3.91 MIL/uL — ABNORMAL LOW (ref 4.22–5.81)
RDW: 15.9 % — ABNORMAL HIGH (ref 11.5–15.5)
WBC: 6.2 10*3/uL (ref 4.0–10.5)
nRBC: 0 % (ref 0.0–0.2)

## 2018-10-10 LAB — BASIC METABOLIC PANEL
Anion gap: 10 (ref 5–15)
BUN: 9 mg/dL (ref 6–20)
CO2: 26 mmol/L (ref 22–32)
Calcium: 8.4 mg/dL — ABNORMAL LOW (ref 8.9–10.3)
Chloride: 98 mmol/L (ref 98–111)
Creatinine, Ser: 0.88 mg/dL (ref 0.61–1.24)
GFR calc Af Amer: >60 mL/min (ref >60)
GFR calc non Af Amer: >60 mL/min (ref >60)
Glucose, Bld: 99 mg/dL (ref 70–99)
Potassium: 4 mmol/L (ref 3.5–5.1)
Sodium: 134 mmol/L — ABNORMAL LOW (ref 135–145)

## 2018-10-10 MED ORDER — MORPHINE SULFATE (PF) 4 MG/ML IV SOLN
4.0000 mg | Freq: Once | INTRAVENOUS | Status: AC
  Administered 2018-10-10: 4 mg via INTRAVENOUS
  Filled 2018-10-10: qty 1

## 2018-10-10 MED ORDER — SODIUM CHLORIDE 0.9% FLUSH
10.0000 mL | INTRAVENOUS | Status: DC | PRN
  Administered 2018-10-10: 13:00:00 10 mL
  Filled 2018-10-10: qty 10

## 2018-10-10 MED ORDER — HYDROMORPHONE HCL 1 MG/ML IJ SOLN
0.5000 mg | Freq: Once | INTRAMUSCULAR | Status: AC
  Administered 2018-10-10: 0.5 mg via INTRAVENOUS
  Filled 2018-10-10: qty 1

## 2018-10-10 MED ORDER — HEPARIN SOD (PORK) LOCK FLUSH 100 UNIT/ML IV SOLN
500.0000 [IU] | Freq: Once | INTRAVENOUS | Status: AC | PRN
  Administered 2018-10-10: 500 [IU]

## 2018-10-10 MED ORDER — OXYCODONE-ACETAMINOPHEN 5-325 MG PO TABS: 1.0000 | ORAL_TABLET | Freq: Four times a day (QID) | ORAL | 0 refills | Status: DC | PRN

## 2018-10-10 NOTE — Discharge Instructions (Addendum)
You are seen in the emergency department for evaluation of injuries from a fall.  You have a right wrist fracture that will need close follow-up with hand surgery.  You should call Dr. Levell July office tomorrow for follow-up.  Please keep your splint on and clean and dry.  We are prescribing you some pain medicine to use as needed but please be careful that they may be sedating.  Return to the emergency department if any concerns.

## 2018-10-10 NOTE — Patient Instructions (Signed)
[facility]  Discharge Instructions:   _______________________________________________________________  Thank you for choosing [facility] at [facility] to provide your oncology and hematology care.  To afford each patient quality time with our providers, please arrive at least 15 minutes before your scheduled appointment.  You need to re-schedule your appointment if you arrive 10 or more minutes late.  We strive to give you quality time with our providers, and arriving late affects you and other patients whose appointments are after yours.  Also, if you no show three or more times for appointments you may be dismissed from the clinic.  Again, thank you for choosing  [facility] Center at [facility]. Our hope is that these requests will allow you access to exceptional care and in a timely manner. _______________________________________________________________  If you have questions after your visit, please contact our office at [phone] between the hours of 8:30 a.m. and 5:00 p.m. Voicemails left after 4:30 p.m. will not be returned until the following business day. _______________________________________________________________  For prescription refill requests, have your pharmacy contact our office. _______________________________________________________________  Recommendations made by the consultant and any test results will be sent to your referring physician. _______________________________________________________________ 

## 2018-10-10 NOTE — Progress Notes (Signed)
Erik [name] presents today for 5FU pump discontinuation. Pump discontinued and port flushed with NS and heparin per protocol. Port deaccessed and bandaid applied. VSS. Discharged in satisfactory condition with follow up instructions.
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
## 2018-10-10 NOTE — ED Provider Notes (Signed)
[facility] EMERGENCY DEPARTMENT Provider Note   CSN: [id number] Arrival date & time: 10/10/18  1641     History   Chief Complaint Chief Complaint  Patient presents with  . Wrist Injury    right    HPI Erik [name] is a 63 y.o. male.  He has history of colon cancer and subdural subarachnoid and currently on anticoagulation.  He said he was coming home from the cancer center and acutely got weak and fell to the ground.  He said he did not pass out.  He injured his right wrist in the fall.  He denies any head strike.  He says he has weak spells like this sometimes and he attributes it to balance an old head injury.  He is complaining of moderate wrist pain in the right wrist.  Denies other injury or complaints.  He says he feels mostly back to baseline other than the wrist.  No headache blurry vision double vision chest pain shortness of breath abdominal pain back pain.  No numbness or weakness.     The history is provided by the patient.  Wrist Injury Location:  Wrist Wrist location:  R wrist Injury: yes   Mechanism of injury: fall   Fall:    Fall occurred:  Walking   Point of impact:  Hands Pain details:    Quality:  Throbbing   Radiates to:  Does not radiate   Severity:  Moderate   Onset quality:  Gradual   Timing:  Constant   Progression:  Worsening Handedness:  Right-handed Foreign body present:  No foreign bodies Relieved by:  None tried Worsened by:  Movement Ineffective treatments:  Immobilization Associated symptoms: decreased range of motion and swelling   Associated symptoms: no back pain, no fever, no neck pain, no numbness and no tingling     Past Medical History:  Diagnosis Date  . Abnormal stress echocardiogram   . Adenocarcinoma of transverse colon ([place]) 02/10/2015  . Alcohol abuse    Heavy Use up until 2010  . Anxiety   . Blood transfusion without reported diagnosis   . Cancer of overlapping sites of colon metastatic to intra-abdominal lymph node  ([place]) 12/26/2017  . COPD (chronic obstructive pulmonary disease) ([place])   . Depression   . Emphysema of lung ([place])   . GERD (gastroesophageal reflux disease)   . Head trauma 2001   closed head injury; coma for 4 weeks  . Hypercholesterolemia   . Hypertension   . ING HERN W/GANGREN RECUR UNILAT/UNSPEC ING HERN 04/21/2009   Qualifier: Diagnosis of  By: Verl Blalock, MD, Delanna Ahmadi PTSD (post-traumatic stress disorder)   . Pulmonary nodule 06/14/2016  . SAH (subarachnoid hemorrhage) ([place]) 08/31/2012  . SDH (subdural hematoma) ([place]) 08/31/2012    Patient Active Problem List   Diagnosis Date Noted  . Colon cancer metastasized to multiple sites ([place]) 01/16/2018  . Malignant neoplasm of overlapping sites of colon ([place]) 12/26/2017  . S/P exploratory laparotomy 12/19/2017  . Carcinomatosis ([place])   . Psoriasis 09/12/2016  . Pulmonary nodule 06/14/2016  . History of colon cancer   . Diarrhea 01/06/2016  . Cigarette nicotine dependence with nicotine-induced disorder 06/18/2015  . Chronic obstructive pulmonary disease ([place]) 06/18/2015  . Depression 06/18/2015  . B12 deficiency 04/14/2015  . Malignant neoplasm of transverse colon ([place]) 02/10/2015  . GERD (gastroesophageal reflux disease) 12/15/2014  . History of colonic polyps 12/15/2014  . History of alcohol abuse 09/16/2014  . Panic disorder 10/10/2012  .  Major depressive disorder, single episode 10/10/2012  . Bilateral lower extremity edema 04/24/2012  . Hyperlipidemia 04/21/2009  . CAD, NATIVE VESSEL 03/25/2009  . HEAD TRAUMA 03/02/2009    Past Surgical History:  Procedure Laterality Date  . BIOPSY  01/25/2016   Procedure: BIOPSY;  Surgeon: Danie Binder, MD;  [facility];  Service: Endoscopy;;  ileum;   . cardiac cath    . COLONOSCOPY  2011   Dr. Oneida Alar: multiple adenomas and hyperplastic polyps  . COLONOSCOPY WITH PROPOFOL N/A 01/12/2015   Procedure: COLONOSCOPY WITH PROPOFOL;  Surgeon: Danie Binder, MD;  Location:  [facility];  Service: Endoscopy;  Laterality: N/A;  1030  . COLONOSCOPY WITH PROPOFOL N/A 01/25/2016   Procedure: COLONOSCOPY WITH PROPOFOL;  Surgeon: Danie Binder, MD;  Location: [facility];  Service: Endoscopy;  Laterality: N/A;  1230  . CRANIOTOMY  2001  . ESOPHAGOGASTRODUODENOSCOPY (EGD) WITH PROPOFOL N/A 01/12/2015   Procedure: ESOPHAGOGASTRODUODENOSCOPY (EGD) WITH PROPOFOL;  Surgeon: Danie Binder, MD;  Location: [facility];  Service: Endoscopy;  Laterality: N/A;  . head injury surgery    . HERNIA REPAIR Right 2012   Inguinal- [place]  . KIDNEY SURGERY     >30 years ago  . LAPAROSCOPIC RIGHT HEMI COLECTOMY Left 02/10/2015   Procedure: LAPAROSCOPIC THEN OPEN RIGHT HEMI COLECTOMY;  Surgeon: [name], MD;  Location: [facility] ORS;  Service: General;  Laterality: Left;  . LAPAROTOMY N/A 12/19/2017   Procedure: EXPLORATORY LAPAROTOMY;  Surgeon: Aviva Signs, MD;  Location: [facility] ORS;  Service: General;  Laterality: N/A;  . POLYPECTOMY  01/25/2016   Procedure: POLYPECTOMY;  Surgeon: Danie Binder, MD;  [facility]: [facility];  Service: Endoscopy;;  colon  . PORT-A-CATH REMOVAL N/A 02/26/2017   Procedure: MINOR REMOVAL PORT-A-CATH;  Surgeon: Aviva Signs, MD;  Location: [facility] ORS;  Service: General;  Laterality: N/A;  Pt to arrive at [place] N/A 03/24/2015   Procedure: INSERTION PORT-A-CATH;  Surgeon: Jules Husbands, MD;  Location: [facility] ORS;  Service: General;  Laterality: N/A;  . PORTACATH PLACEMENT Left 01/07/2018   Procedure: INSERTION PORT A CATH (ATTACHED CATHETER IN LEFT SUBCLAVIAN);  Surgeon: Aviva Signs, MD;  Location: [facility];  Service: General;  Laterality: Left;        Home Medications    Prior to Admission medications   Medication Sig Start Date End Date Taking? Authorizing Provider  amitriptyline (ELAVIL) 25 MG tablet Take 1 tablet (25 mg total) by mouth at bedtime. 08/08/18   Tomi Likens, Adam R, DO  atorvastatin (LIPITOR) 20 MG tablet TAKE 1  TABLET BY MOUTH AT BEDTIME 05/29/18   Derek Jack, MD  buPROPion [facility] SR) 150 MG 12 hr tablet Take 150 mg by mouth every morning.     [provider]  busPIRone (BUSPAR) 10 MG tablet Take 10 mg by mouth 2 (two) times daily.    [provider]  fluorouracil CALGB 30160 in sodium chloride 0.9 % 150 mL Inject 5,650 mg into the vein. Over 46 hours    [provider]  FLUoxetine (PROZAC) 20 MG capsule TAKE 1 CAPSULE BY MOUTH 2 TIMES A DAY Patient taking differently: Take 20 mg by mouth 2 (two) times daily.  06/14/16   [name], PA-C  gabapentin (NEURONTIN) 300 MG capsule TAKE 1 CAPSULE BY MOUTH THREE TIMES A DAY 07/04/18   Derek Jack, MD  IRINOTECAN HCL IV Inject 420 mg into the vein every 14 (fourteen) days.  [provider]  LEUCOVORIN CALCIUM IV Inject 944 mg into the vein every 14 (fourteen) days.    [provider]  lidocaine-prilocaine (EMLA) cream APPLY A SMALL AMOUNT OVER PORT SITE AND COVER WITH PLASTIC WRAP ONE HOUR PRIOR TO APPOINTMENT 05/09/18   Derek Jack, MD  loperamide (IMODIUM) 2 MG capsule 2 tablets twice daily 06/10/18   Derek Jack, MD  omeprazole (PRILOSEC) 20 MG capsule Take 20 mg by mouth every morning.     [provider]  ondansetron (ZOFRAN) 8 MG tablet Take 1 tablet (8 mg total) by mouth 2 (two) times daily as needed for refractory nausea / vomiting. Start on day 3 after chemotherapy. 12/27/17   Higgs, Mathis Dad, MD  PROAIR HFA 108 (90 Base) MCG/ACT inhaler INHALE 2 PUFFS BY MOUTH EVERY 6 HOURS AS NEEDED FOR SHORTNESS OF BREATH/WHEEZING. 02/13/17   Raylene Everts, MD  prochlorperazine (COMPAZINE) 10 MG tablet Take 1 tablet (10 mg total) by mouth every 6 (six) hours as needed (NAUSEA). 12/27/17   Higgs, Mathis Dad, MD  rivaroxaban (XARELTO) 20 MG TABS tablet Take 1 tablet (20 mg total) by mouth daily with supper. 06/26/18   Derek Jack, MD    Family History Family History   Problem Relation Age of Onset  . Pulmonary embolism Mother   . Breast cancer Mother   . Cancer Mother        breast cancer  . Arthritis Mother   . Heart disease Father   . Cancer Father 15       Leukemia  . Cancer Paternal Grandfather        Lung  . Ataxia Neg Hx   . Chorea Neg Hx   . Dementia Neg Hx   . Mental retardation Neg Hx   . Migraines Neg Hx   . Multiple sclerosis Neg Hx   . Neurofibromatosis Neg Hx   . Neuropathy Neg Hx   . Parkinsonism Neg Hx   . Seizures Neg Hx   . Stroke Neg Hx   . Colon cancer Neg Hx     Social History Social History   Tobacco Use  . Smoking status: Former Smoker    Packs/day: 0.10    Years: 30.00    Pack years: 3.00    Types: Cigarettes    Start date: 02/06/1973    Quit date: 12/09/2017    Years since quitting: 0.8  . Smokeless tobacco: Never Used  . Tobacco comment: Quit on 12/09/2017  Substance Use Topics  . Alcohol use: Yes    Alcohol/week: 0.0 standard drinks    Comment: beer occ, history of ETOH abuse in remote past.   . Drug use: No     Allergies   Patient has no known allergies.   Review of Systems Review of Systems  Constitutional: Negative for fever.  HENT: Negative for sore throat.   Eyes: Negative for visual disturbance.  Respiratory: Negative for shortness of breath.   Cardiovascular: Negative for chest pain.  Gastrointestinal: Negative for abdominal pain.  Genitourinary: Negative for dysuria.  Musculoskeletal: Positive for gait problem. Negative for back pain and neck pain.  Skin: Negative for rash.  Neurological: Negative for headaches.     Physical Exam Updated Vital Signs BP (!) 120/99 (BP Location: Left Arm)   Pulse 99   Temp 98.5 F (36.9 C) (Oral)   Resp 16   Ht 6\' 2"  (1.88 m)   Wt 104.3 kg   SpO2 98%   BMI 29.53 kg/m   Physical  Exam Vitals signs and nursing note reviewed.  Constitutional:      Appearance: He is well-developed.  HENT:     Head: Normocephalic and atraumatic.  Eyes:      Conjunctiva/sclera: Conjunctivae normal.  Neck:     Musculoskeletal: Neck supple.  Cardiovascular:     Rate and Rhythm: Normal rate and regular rhythm.     Pulses: Normal pulses.     Heart sounds: No murmur.  Pulmonary:     Effort: Pulmonary effort is normal. No respiratory distress.     Breath sounds: Normal breath sounds.  Abdominal:     Palpations: Abdomen is soft.     Tenderness: There is no abdominal tenderness.  Musculoskeletal:     Comments: Patient has moderate swelling at his left wrist with some deformity.  Distal cap refill motor and sensory intact.  Elbow shoulder nontender.  Full range of motion of left upper extremity and bilateral lower extremities without any pain or limitation.  Skin:    General: Skin is warm and dry.     Capillary Refill: Capillary refill takes less than 2 seconds.  Neurological:     General: No focal deficit present.     Mental Status: He is alert and oriented to person, place, and time.      ED Treatments / Results  Labs (all labs ordered are listed, but only abnormal results are displayed) Labs Reviewed  BASIC METABOLIC PANEL - Abnormal; Notable for the following components:      Result Value   Sodium 134 (*)    Calcium 8.4 (*)    All other components within normal limits  CBC WITH DIFFERENTIAL/PLATELET - Abnormal; Notable for the following components:   RBC 3.91 (*)    Hemoglobin 12.9 (*)    MCV 105.4 (*)    RDW 15.9 (*)    All other components within normal limits    EKG EKG Interpretation  Date/Time:  Thursday October 10 2018 17:54:45 EDT Ventricular Rate:  91 PR Interval:    QRS Duration: 112 QT Interval:  369 QTC Calculation: 454 R Axis:   -88 Text Interpretation:  Sinus rhythm Left anterior fascicular block ST elevation, consider inferior injury No significant changes from prior.  No STEMI Confirmed by Antony Blackbird [phone]) on 10/11/2018 9:02:59 AM   Radiology Dg Forearm Right  Result Date: 10/10/2018 CLINICAL DATA:   Fall with wrist pain EXAM: RIGHT FOREARM - 2 VIEW COMPARISON:  None. FINDINGS: Acute displaced ulnar styloid process fracture. Acute comminuted intra-articular distal radius fracture. Proximal and mid radius and ulna appear intact. IMPRESSION: 1. Acute displaced ulnar styloid process fracture 2. Acute comminuted and displaced intra-articular distal radius fracture Electronically Signed   By: Donavan Foil M.D.   On: 10/10/2018 19:08   Dg Wrist Complete Right  Result Date: 10/10/2018 CLINICAL DATA:  Fall with wrist pain EXAM: RIGHT WRIST - COMPLETE 3+ VIEW COMPARISON:  None. FINDINGS: Acute displaced ulnar styloid process fracture. Acute markedly comminuted intra-articular fracture distal radius with impaction, mild dorsal angulation, and about 1/2 bone with dorsal displacement of distal fracture fragments. IMPRESSION: 1. Acute comminuted, slightly angulated and displaced intra-articular fracture distal radius 2. Acute displaced ulnar styloid process fracture Electronically Signed   By: Donavan Foil M.D.   On: 10/10/2018 19:08   Ct Head Wo Contrast  Result Date: 10/10/2018 CLINICAL DATA:  [place] today.  Leg weakness. EXAM: CT HEAD WITHOUT CONTRAST TECHNIQUE: Contiguous axial images were obtained from the base of the skull through the vertex  without intravenous contrast. COMPARISON:  09/26/2012 FINDINGS: Brain: Chronic generalized atrophy. Chronic encephalomalacia, atrophy and gliosis in the right frontal and temporal lobes consistent with previous head injury. Mild encephalomalacia at the inferior left frontal lobe. No sign of mass lesion, hemorrhage, hydrocephalus or extra-axial collection. Vascular: No abnormal vascular finding. Skull: Negative Sinuses/Orbits: Some mucosal thickening of the right maxillary sinus. Other: None IMPRESSION: No acute CT finding. Chronic encephalomalacia of the right temporal lobe, right frontal lobe and to a minimal extent the inferior left frontal lobe, unchanged since 2014 and  consistent with previous closed head injury. Electronically Signed   By: Nelson Chimes M.D.   On: 10/10/2018 18:52   Dg Hand Complete Right  Result Date: 10/10/2018 CLINICAL DATA:  Fall with wrist pain EXAM: RIGHT HAND - COMPLETE 3+ VIEW COMPARISON:  None. FINDINGS: Acute ulnar styloid process fracture with displacement. Acute markedly comminuted intra-articular distal radius fracture with angulation and displacement. No fracture or malalignment within the digits of the right hand IMPRESSION: Acute comminuted displaced intra-articular distal radius fracture. Acute displaced ulnar styloid fracture Electronically Signed   By: Donavan Foil M.D.   On: 10/10/2018 19:09    Procedures Procedures (including critical care time)  Medications Ordered in ED Medications  morphine 4 MG/ML injection 4 mg (4 mg Intravenous Given 10/10/18 1800)  HYDROmorphone (DILAUDID) injection 0.5 mg (0.5 mg Intravenous Given 10/10/18 1914)     Initial Impression / Assessment and Plan / ED Course  I have reviewed the triage vital signs and the nursing notes.  Pertinent labs & imaging results that were available during my care of the patient were reviewed by me and considered in my medical decision making (see chart for details).  Clinical Course as of Oct 10 1037  Thu Oct 10, 6411  [id number] 63 year old male with colon cancer here after a week spell where she fell and injured his right wrist.  Differential includes fracture, dislocation, sprain, metabolic derangement, arrhythmia, CNS bleed   [MB]  1919 Patient's head CT does not show any acute findings and reviewed independently by me.  His plain film x-rays show a comminuted distal radius intra-articular fracture and an ulnar styloid fracture.  Placed a call into hand consult   [MB]  1938 Discussed with Dr. Fredna Dow from hand surgery who recommends placing patient in a sugar tong and have him call the office tomorrow for follow-up.   [MB]    Clinical Course User Index [MB]  Hayden Rasmussen, MD   Erik [name] was evaluated in Emergency Department on 10/10/2018 for the symptoms described in the history of present illness. He was evaluated in the context of the global COVID-19 pandemic, which necessitated consideration that the patient might be at risk for infection with the SARS-CoV-2 virus that causes COVID-19. Institutional protocols and algorithms that pertain to the evaluation of patients at risk for COVID-19 are in a state of rapid change based on information released by regulatory bodies including the CDC and federal and state organizations. These policies and algorithms were followed during the patient's care in the ED.      Final Clinical Impressions(s) / ED Diagnoses   Final diagnoses:  Right wrist fracture, closed, initial encounter  Fall, initial encounter    ED Discharge Orders         Ordered    oxyCODONE-acetaminophen (PERCOCET/ROXICET) 5-325 MG tablet  Every 6 hours PRN     10/10/18 2011           Hayden Rasmussen,  MD 10/11/18 1040

## 2018-10-16 ENCOUNTER — Other Ambulatory Visit (HOSPITAL_COMMUNITY)
Admission: RE | Admit: 2018-10-16 | Discharge: 2018-10-16 | Disposition: A | Discharge status: Home / Self Care | Payer: Medicaid Other | Source: Ambulatory Visit | Attending: Orthopedic Surgery | Admitting: Orthopedic Surgery

## 2018-10-16 ENCOUNTER — Encounter (HOSPITAL_BASED_OUTPATIENT_CLINIC_OR_DEPARTMENT_OTHER): Payer: Self-pay | Admitting: *Deleted

## 2018-10-16 ENCOUNTER — Other Ambulatory Visit: Payer: Self-pay

## 2018-10-16 ENCOUNTER — Encounter (HOSPITAL_COMMUNITY): Payer: Self-pay | Admitting: *Deleted

## 2018-10-16 ENCOUNTER — Other Ambulatory Visit: Payer: Self-pay | Admitting: Orthopedic Surgery

## 2018-10-16 DIAGNOSIS — Z20828 Contact with and (suspected) exposure to other viral communicable diseases: Secondary | ICD-10-CM | POA: Diagnosis not present

## 2018-10-16 DIAGNOSIS — Z01812 Encounter for preprocedural laboratory examination: Secondary | ICD-10-CM | POA: Diagnosis present

## 2018-10-16 LAB — SARS CORONAVIRUS 2 (TAT 6-24 HRS): SARS Coronavirus 2: NEGATIVE
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
## 2018-10-18 ENCOUNTER — Encounter (HOSPITAL_BASED_OUTPATIENT_CLINIC_OR_DEPARTMENT_OTHER): Payer: Self-pay | Admitting: Certified Registered"

## 2018-10-18 ENCOUNTER — Other Ambulatory Visit: Payer: Self-pay

## 2018-10-18 ENCOUNTER — Ambulatory Visit (HOSPITAL_BASED_OUTPATIENT_CLINIC_OR_DEPARTMENT_OTHER): Payer: Medicaid Other | Admitting: Certified Registered"

## 2018-10-18 ENCOUNTER — Encounter (HOSPITAL_BASED_OUTPATIENT_CLINIC_OR_DEPARTMENT_OTHER)
Admission: RE | Disposition: A | Discharge status: Home / Self Care | Payer: Self-pay | Source: Home / Self Care | Attending: Orthopedic Surgery

## 2018-10-18 ENCOUNTER — Ambulatory Visit (HOSPITAL_BASED_OUTPATIENT_CLINIC_OR_DEPARTMENT_OTHER)
Admission: RE | Admit: 2018-10-18 | Discharge: 2018-10-18 | Disposition: A | Discharge status: Home / Self Care | Payer: Medicaid Other | Attending: Orthopedic Surgery | Admitting: Orthopedic Surgery

## 2018-10-18 DIAGNOSIS — Z7901 Long term (current) use of anticoagulants: Secondary | ICD-10-CM | POA: Insufficient documentation

## 2018-10-18 DIAGNOSIS — E7800 Pure hypercholesterolemia, unspecified: Secondary | ICD-10-CM | POA: Diagnosis not present

## 2018-10-18 DIAGNOSIS — W1830XA Fall on same level, unspecified, initial encounter: Secondary | ICD-10-CM | POA: Diagnosis not present

## 2018-10-18 DIAGNOSIS — F329 Major depressive disorder, single episode, unspecified: Secondary | ICD-10-CM | POA: Insufficient documentation

## 2018-10-18 DIAGNOSIS — S62101A Fracture of unspecified carpal bone, right wrist, initial encounter for closed fracture: Secondary | ICD-10-CM | POA: Diagnosis present

## 2018-10-18 DIAGNOSIS — J439 Emphysema, unspecified: Secondary | ICD-10-CM | POA: Insufficient documentation

## 2018-10-18 DIAGNOSIS — Z79899 Other long term (current) drug therapy: Secondary | ICD-10-CM | POA: Diagnosis not present

## 2018-10-18 DIAGNOSIS — I10 Essential (primary) hypertension: Secondary | ICD-10-CM | POA: Insufficient documentation

## 2018-10-18 DIAGNOSIS — Z87891 Personal history of nicotine dependence: Secondary | ICD-10-CM | POA: Diagnosis not present

## 2018-10-18 DIAGNOSIS — S52571A Other intraarticular fracture of lower end of right radius, initial encounter for closed fracture: Secondary | ICD-10-CM | POA: Insufficient documentation

## 2018-10-18 DIAGNOSIS — Z85038 Personal history of other malignant neoplasm of large intestine: Secondary | ICD-10-CM | POA: Diagnosis not present

## 2018-10-18 DIAGNOSIS — I2510 Atherosclerotic heart disease of native coronary artery without angina pectoris: Secondary | ICD-10-CM | POA: Insufficient documentation

## 2018-10-18 DIAGNOSIS — K219 Gastro-esophageal reflux disease without esophagitis: Secondary | ICD-10-CM | POA: Insufficient documentation

## 2018-10-18 HISTORY — PX: OPEN REDUCTION INTERNAL FIXATION (ORIF) DISTAL RADIAL FRACTURE: SHX5989

## 2018-10-18 SURGERY — OPEN REDUCTION INTERNAL FIXATION (ORIF) DISTAL RADIUS FRACTURE
Anesthesia: Monitor Anesthesia Care | Site: Wrist | Laterality: Right

## 2018-10-18 MED ORDER — FENTANYL CITRATE (PF) 100 MCG/2ML IJ SOLN
INTRAMUSCULAR | Status: AC
  Filled 2018-10-18: qty 2

## 2018-10-18 MED ORDER — FENTANYL CITRATE (PF) 100 MCG/2ML IJ SOLN: 25.0000 ug | INTRAMUSCULAR | Status: DC | PRN

## 2018-10-18 MED ORDER — PROMETHAZINE HCL 25 MG/ML IJ SOLN: 6.2500 mg | INTRAMUSCULAR | Status: DC | PRN

## 2018-10-18 MED ORDER — PROPOFOL 500 MG/50ML IV EMUL
INTRAVENOUS | Status: DC | PRN
  Administered 2018-10-18: 75 ug/kg/min via INTRAVENOUS

## 2018-10-18 MED ORDER — OXYCODONE-ACETAMINOPHEN 5-325 MG PO TABS: ORAL_TABLET | ORAL | 0 refills | Status: DC

## 2018-10-18 MED ORDER — ACETAMINOPHEN 500 MG PO TABS
1000.0000 mg | ORAL_TABLET | Freq: Once | ORAL | Status: AC
  Administered 2018-10-18: 1000 mg via ORAL

## 2018-10-18 MED ORDER — CHLORHEXIDINE GLUCONATE 4 % EX LIQD: 60.0000 mL | Freq: Once | CUTANEOUS | Status: DC

## 2018-10-18 MED ORDER — BUPIVACAINE-EPINEPHRINE (PF) 0.5% -1:200000 IJ SOLN
INTRAMUSCULAR | Status: DC | PRN
  Administered 2018-10-18: 30 mL via PERINEURAL

## 2018-10-18 MED ORDER — CEFAZOLIN SODIUM-DEXTROSE 2-4 GM/100ML-% IV SOLN
2.0000 g | INTRAVENOUS | Status: AC
  Administered 2018-10-18: 12:00:00 2 g via INTRAVENOUS

## 2018-10-18 MED ORDER — MIDAZOLAM HCL 2 MG/2ML IJ SOLN
1.0000 mg | INTRAMUSCULAR | Status: DC | PRN
  Administered 2018-10-18: 2 mg via INTRAVENOUS

## 2018-10-18 MED ORDER — LACTATED RINGERS IV SOLN
INTRAVENOUS | Status: DC
  Administered 2018-10-18: 11:00:00 via INTRAVENOUS

## 2018-10-18 MED ORDER — ONDANSETRON HCL 4 MG/2ML IJ SOLN
INTRAMUSCULAR | Status: DC | PRN
  Administered 2018-10-18: 4 mg via INTRAVENOUS

## 2018-10-18 MED ORDER — MIDAZOLAM HCL 2 MG/2ML IJ SOLN
INTRAMUSCULAR | Status: AC
  Filled 2018-10-18: qty 2

## 2018-10-18 MED ORDER — FENTANYL CITRATE (PF) 100 MCG/2ML IJ SOLN
50.0000 ug | INTRAMUSCULAR | Status: DC | PRN
  Administered 2018-10-18: 100 ug via INTRAVENOUS

## 2018-10-18 MED ORDER — CEFAZOLIN SODIUM-DEXTROSE 2-4 GM/100ML-% IV SOLN
INTRAVENOUS | Status: AC
  Filled 2018-10-18: qty 100

## 2018-10-18 MED ORDER — ACETAMINOPHEN 500 MG PO TABS
ORAL_TABLET | ORAL | Status: AC
  Filled 2018-10-18: qty 2

## 2018-10-18 MED ORDER — DEXAMETHASONE SODIUM PHOSPHATE 4 MG/ML IJ SOLN
INTRAMUSCULAR | Status: DC | PRN
  Administered 2018-10-18: 8 mg

## 2018-10-18 SURGICAL SUPPLY — 58 items
BIT DRILL 2.0 LNG QUCK RELEASE (BIT) ×1 IMPLANT
BIT DRILL 2.8X5 QR DISP (BIT) ×3 IMPLANT
BLADE SURG 15 STRL LF DISP TIS (BLADE) ×2 IMPLANT
BLADE SURG 15 STRL SS (BLADE) ×4
BNDG ELASTIC 3X5.8 VLCR STR LF (GAUZE/BANDAGES/DRESSINGS) ×3 IMPLANT
BNDG ESMARK 4X9 LF (GAUZE/BANDAGES/DRESSINGS) ×3 IMPLANT
BNDG GAUZE ELAST 4 BULKY (GAUZE/BANDAGES/DRESSINGS) ×3 IMPLANT
BNDG PLASTER X FAST 3X3 WHT LF (CAST SUPPLIES) ×30 IMPLANT
CHLORAPREP W/TINT 26 (MISCELLANEOUS) ×3 IMPLANT
CORD BIPOLAR FORCEPS 12FT (ELECTRODE) ×3 IMPLANT
COVER BACK TABLE REUSABLE LG (DRAPES) ×3 IMPLANT
COVER MAYO STAND REUSABLE (DRAPES) ×3 IMPLANT
COVER WAND RF STERILE (DRAPES) IMPLANT
CUFF TOURN SGL QUICK 18X4 (TOURNIQUET CUFF) ×3 IMPLANT
CUFF TOURN SGL QUICK 24 (TOURNIQUET CUFF)
CUFF TRNQT CYL 24X4X16.5-23 (TOURNIQUET CUFF) IMPLANT
DRAPE EXTREMITY T 121X128X90 (DISPOSABLE) ×3 IMPLANT
DRAPE OEC MINIVIEW 54X84 (DRAPES) ×3 IMPLANT
DRAPE SURG 17X23 STRL (DRAPES) ×3 IMPLANT
DRILL 2.0 LNG QUICK RELEASE (BIT) ×3
GAUZE SPONGE 4X4 12PLY STRL (GAUZE/BANDAGES/DRESSINGS) ×3 IMPLANT
GAUZE XEROFORM 1X8 LF (GAUZE/BANDAGES/DRESSINGS) ×3 IMPLANT
GLOVE BIO SURGEON STRL SZ7.5 (GLOVE) ×3 IMPLANT
GLOVE BIOGEL PI IND STRL 7.0 (GLOVE) ×2 IMPLANT
GLOVE BIOGEL PI IND STRL 8 (GLOVE) ×1 IMPLANT
GLOVE BIOGEL PI IND STRL 8.5 (GLOVE) ×1 IMPLANT
GLOVE BIOGEL PI INDICATOR 7.0 (GLOVE) ×4
GLOVE BIOGEL PI INDICATOR 8 (GLOVE) ×2
GLOVE BIOGEL PI INDICATOR 8.5 (GLOVE) ×2
GLOVE ECLIPSE 6.5 STRL STRAW (GLOVE) ×6 IMPLANT
GLOVE SURG ORTHO 8.0 STRL STRW (GLOVE) ×3 IMPLANT
GOWN STRL REUS W/ TWL LRG LVL3 (GOWN DISPOSABLE) ×2 IMPLANT
GOWN STRL REUS W/TWL LRG LVL3 (GOWN DISPOSABLE) ×4
GOWN STRL REUS W/TWL XL LVL3 (GOWN DISPOSABLE) ×6 IMPLANT
GUIDEWIRE ORTHO 0.054X6 (WIRE) ×9 IMPLANT
NEEDLE HYPO 25X1 1.5 SAFETY (NEEDLE) IMPLANT
NS IRRIG 1000ML POUR BTL (IV SOLUTION) ×3 IMPLANT
PACK BASIN DAY SURGERY FS (CUSTOM PROCEDURE TRAY) ×3 IMPLANT
PAD CAST 3X4 CTTN HI CHSV (CAST SUPPLIES) ×1 IMPLANT
PADDING CAST COTTON 3X4 STRL (CAST SUPPLIES) ×2
PLATE STD RT ACULOC 2 (Plate) ×3 IMPLANT
SCREW CORT FT 22X2.3XLCK HEX (Screw) ×2 IMPLANT
SCREW CORTICAL LOCKING 2.3X22M (Screw) ×8 IMPLANT
SCREW CORTICAL LOCKING 2.3X24M (Screw) ×6 IMPLANT
SCREW FX22X2.3XLCK SMTH NS CRT (Screw) ×2 IMPLANT
SCREW FX24X2.3XLCK SMTH NS CRT (Screw) ×3 IMPLANT
SCREW HEXALOBE NON-LOCK 3.5X14 (Screw) ×3 IMPLANT
SCREW NLCKG 13 3.5X13 HEXA (Screw) ×1 IMPLANT
SCREW NON-LOCK 3.5X13 (Screw) ×3 IMPLANT
SCREW NONLOCK HEX 3.5X12 (Screw) ×3 IMPLANT
SLEEVE SCD COMPRESS KNEE MED (MISCELLANEOUS) ×3 IMPLANT
STOCKINETTE 4X48 STRL (DRAPES) ×3 IMPLANT
SUT ETHILON 4 0 PS 2 18 (SUTURE) ×3 IMPLANT
SUT VICRYL 4-0 PS2 18IN ABS (SUTURE) ×3 IMPLANT
SYR BULB 3OZ (MISCELLANEOUS) ×3 IMPLANT
SYR CONTROL 10ML LL (SYRINGE) IMPLANT
TOWEL GREEN STERILE FF (TOWEL DISPOSABLE) ×6 IMPLANT
UNDERPAD 30X36 HEAVY ABSORB (UNDERPADS AND DIAPERS) ×3 IMPLANT

## 2018-10-18 NOTE — Op Note (Signed)
10/18/2018 [facility]  Operative Note  Pre Op Diagnosis: Right comminuted intraarticular distal radius fracture  Post Op Diagnosis: Right comminuted intraarticular distal radius fracture  Procedure:  1. ORIF Right comminuted intraarticular distal radius fracture, 3 intraarticular fragments 2. Right brachioradialis release  Surgeon: [name], MD  Assistant: Daryll Brod, MD  Anesthesia: Regional with sedation  Fluids: Per anesthesia flow sheet  EBL: minimal  Complications: None  Specimen: None  Tourniquet Time:  Total Tourniquet Time Documented: Upper Arm (Right) - 64 minutes Total: Upper Arm (Right) - 64 minutes   Disposition: Stable to PACU  INDICATIONS:  Erik [name] is a 63 y.o. male who states he fell one week ago injuring his right wrist.  Seen in ED where XR revealed right distal radius fracture.  Splinted and followed up in the office.  He was seen by Leverne Humbles, PAC and discussed nonoperative and operative treatment options.  He wished to proceed with operative fixation.  Risks, benefits, and alternatives of surgery were discussed including the risk of blood loss; infection; damage to nerves, vessels, tendons, ligaments, bone; failure of surgery; need for additional surgery; complications with wound healing; continued pain; nonunion; malunion; stiffness.  We also discussed the possible need for bone graft and the benefits and risks including the possibility of disease transmission.  He voiced understanding of these risks and elected to proceed.    OPERATIVE COURSE:  After being identified preoperatively by myself, the patient and I agreed upon the procedure and site of procedure.  Surgical site was marked.  The risks, benefits and alternatives of the surgery were reviewed and he wished to proceed.  Surgical consent had been signed.  He was given IV Ancef as preoperative antibiotic prophylaxis.  He was transferred to the operating room and placed on  the operating room table in supine position with the Right upper extremity on an armboard. Sedation was induced by the anesthesiologist.  A regional block had been performed by anesthesia in preoperative holding.  The Right upper extremity was prepped and draped in normal sterile orthopedic fashion.  A surgical pause was performed between the surgeons, anesthesia and operating room staff, and all were in agreement as to the patient, procedure and site of procedure.  Tourniquet at the proximal aspect of the extremity was inflated to 250 mmHg after exsanguination of the limb with an Esmarch bandage.  Standard volar Mallie Mussel approach was used.  The bipolar electrocautery was used to obtain hemostasis.  The superficial and deep portions of the FCR tendon sheath were incised, and the FCR and FPL were swept ulnarly to protect the palmar cutaneous branch of the median nerve.  The brachioradialis was released at the radial side of the radius.  The pronator quadratus was released and elevated with the periosteal elevator.  The fracture site was identified and cleared of soft tissue interposition and hematoma.  It was reduced under direct visualization.  There was intraarticular comminution creating three intraarticular fragments.  An AcuMed volar distal radial locking plate was selected.  It was secured to the bone with the guidepins.  C-arm was used in AP and lateral projections to ensure appropriate reduction and position of the hardware and adjustments made as necessary.  Standard AO drilling and measuring technique was used.  A single screw was placed in the slotted hole in the shaft of the plate.  The distal holes were filled with locking pegs with the exception of the styloid holes, which were filled with locking screws.  The remaining holes in the shaft of the plate were filled with nonlocking screws.  Good purchase was obtained.  C-arm was used in AP, lateral and oblique projections to ensure appropriate reduction and  position of hardware, which was the case.  There was no intra-articular penetration of hardware.  The wound was copiously irrigated with sterile saline.  Pronator quadratus was repaired back over top of the plate using 4-0 Vicryl suture.  Vicryl suture was placed in the subcutaneous tissues in an inverted interrupted fashion and the skin was closed with 4-0 nylon in a horizontal mattress fashion.  There was good pronation and supination of the wrist without crepitance.  The wound was then dressed with sterile Xeroform, 4x4s, and wrapped with a Kerlix bandage.  A volar splint was placed and wrapped with Kerlix and Ace bandage.  Tourniquet was deflated at 64 minutes.  Fingertips were pink with brisk capillary refill after deflation of the tourniquet.  Operative drapes were broken down.  The patient was awoken from anesthesia safely.  He was transferred back to the stretcher and taken to the PACU in stable condition.  I will see him back in the office in one week for postoperative followup.  I will give him a prescription for Norco 5/325 1-2 tabs PO q6 hours prn pain, dispense # 30.    [name], MD Electronically signed, 10/18/18

## 2018-10-18 NOTE — Anesthesia Preprocedure Evaluation (Addendum)
Anesthesia Evaluation  Patient identified by MRN, date of birth, ID band Patient awake    Reviewed: Allergy & Precautions, NPO status , Patient's Chart, lab work & pertinent test results  History of Anesthesia Complications Negative for: history of anesthetic complications  Airway Mallampati: II       Dental  (+) Teeth Intact, Dental Advidsory Given   Pulmonary COPD, Patient abstained from smoking., former smoker,    Pulmonary exam normal        Cardiovascular hypertension, On Medications + CAD  Normal cardiovascular exam     Neuro/Psych PSYCHIATRIC DISORDERS Anxiety Depression    GI/Hepatic Neg liver ROS, GERD  ,Adeno Ca   Endo/Other  negative endocrine ROS  Renal/GU negative Renal ROS     Musculoskeletal   Abdominal   Peds  Hematology   Anesthesia Other Findings    Reproductive/Obstetrics                            Anesthesia Physical  Anesthesia Plan  ASA: III  Anesthesia Plan: MAC and Regional   Post-op Pain Management:    Induction: Intravenous  PONV Risk Score and Plan: 2 and Ondansetron and Propofol infusion  Airway Management Planned: Natural Airway  Additional Equipment:   Intra-op Plan:   Post-operative Plan:   Informed Consent: I have reviewed the patients History and Physical, chart, labs and discussed the procedure including the risks, benefits and alternatives for the proposed anesthesia with the patient or authorized representative who has indicated his/her understanding and acceptance.     Dental advisory given  Plan Discussed with: Anesthesiologist, CRNA and Surgeon  Anesthesia Plan Comments:      Anesthesia Quick Evaluation

## 2018-10-18 NOTE — Discharge Instructions (Addendum)
[facility] Instructions Hand Surgery  Wound Care: Keep your hand elevated above the level of your heart.  Do not allow it to dangle by your side.  Keep the dressing dry and do not remove it unless your doctor advises you to do so.  He will usually change it at the time of your post-op visit.  Moving your fingers is advised to stimulate circulation but will depend on the site of your surgery.  If you have a splint applied, your doctor will advise you regarding movement.  Activity: Do not drive or operate machinery today.  Rest today and then you may return to your normal activity and work as indicated by your physician.  Diet:  Drink liquids today or eat a light diet.  You may resume a regular diet tomorrow.    General expectations: Pain for two to three days. Fingers may become slightly swollen.  Call your doctor if any of the following occur: Severe pain not relieved by pain medication. Elevated temperature. Dressing soaked with blood. Inability to move fingers. White or bluish color to fingers.   Able to take next dose of Tylenol at 5:15 PM on 10/18/2018 if needed.  Restart Xarelto 24 hours post op.     Post Anesthesia Home Care Instructions  Activity: Get plenty of rest for the remainder of the day. A responsible individual must stay with you for 24 hours following the procedure.  For the next 24 hours, DO NOT: -Drive a car -Paediatric nurse -Drink alcoholic beverages -Take any medication unless instructed by your physician -Make any legal decisions or sign important papers.  Meals: Start with liquid foods such as gelatin or soup. Progress to regular foods as tolerated. Avoid greasy, spicy, heavy foods. If nausea and/or vomiting occur, drink only clear liquids until the nausea and/or vomiting subsides. Call your physician if vomiting continues.  Special Instructions/Symptoms: Your throat may feel dry or sore from the anesthesia or the breathing tube placed in your  throat during surgery. If this causes discomfort, gargle with warm salt water. The discomfort should disappear within 24 hours.  If you had a scopolamine patch placed behind your ear for the management of post- operative nausea and/or vomiting:  1. The medication in the patch is effective for 72 hours, after which it should be removed.  Wrap patch in a tissue and discard in the trash. Wash hands thoroughly with soap and water. 2. You may remove the patch earlier than 72 hours if you experience unpleasant side effects which may include dry mouth, dizziness or visual disturbances. 3. Avoid touching the patch. Wash your hands with soap and water after contact with the patch.      Regional Anesthesia Blocks  1. Numbness or the inability to move the "blocked" extremity may last from 3-48 hours after placement. The length of time depends on the medication injected and your individual response to the medication. If the numbness is not going away after 48 hours, call your surgeon.  2. The extremity that is blocked will need to be protected until the numbness is gone and the  Strength has returned. Because you cannot feel it, you will need to take extra care to avoid injury. Because it may be weak, you may have difficulty moving it or using it. You may not know what position it is in without looking at it while the block is in effect.  3. For blocks in the legs and feet, returning to weight bearing and walking needs to  be done carefully. You will need to wait until the numbness is entirely gone and the strength has returned. You should be able to move your leg and foot normally before you try and bear weight or walk. You will need someone to be with you when you first try to ensure you do not fall and possibly risk injury.  4. Bruising and tenderness at the needle site are common side effects and will resolve in a few days.  5. Persistent numbness or new problems with movement should be communicated to  the surgeon or the [place] [phone] [place] [phone]).

## 2018-10-18 NOTE — Anesthesia Postprocedure Evaluation (Signed)
Anesthesia Post Note  Patient: Erik [name]  Procedure(s) Performed: OPEN REDUCTION INTERNAL FIXATION (ORIF) RIGHT DISTAL RADIAL FRACTURE (Right Wrist)     Patient location during evaluation: PACU Anesthesia Type: Regional and MAC Level of consciousness: awake and alert Pain management: pain level controlled Vital Signs Assessment: post-procedure vital signs reviewed and stable Respiratory status: spontaneous breathing and respiratory function stable Cardiovascular status: stable Postop Assessment: no apparent nausea or vomiting Anesthetic complications: no    Last Vitals:  Vitals:   10/18/18 1415 10/18/18 1430  BP: 127/73   Pulse: 79   Resp: 18   Temp:    SpO2: 93% 94%    Last Pain:  Vitals:   10/18/18 1430  TempSrc:   PainSc: 0-No pain                 Cinthia Rodden DANIEL

## 2018-10-18 NOTE — Progress Notes (Signed)
Assisted Dr. Singer with right, ultrasound guided, supraclavicular block. Side rails up, monitors on throughout procedure. See vital signs in flow sheet. Tolerated Procedure well. 

## 2018-10-18 NOTE — Anesthesia Procedure Notes (Signed)
Anesthesia Regional Block: Supraclavicular block   Pre-Anesthetic Checklist: ,, timeout performed, Correct Patient, Correct Site, Correct Laterality, Correct Procedure,, site marked, risks and benefits discussed, Surgical consent,  Pre-op evaluation,  At surgeon's request and post-op pain management  Laterality: Right  Prep: chloraprep       Needles:  Injection technique: Single-shot  Needle Type: Echogenic Stimulator Needle     Needle Length: 5cm  Needle Gauge: 22     Additional Needles:   Procedures:, nerve stimulator,,,, intact distal pulses,,,   Nerve Stimulator or Paresthesia:  Response: bicep contraction, 0.48 mA,   Additional Responses:   Narrative:  Start time: 10/18/2018 11:44 AM End time: 10/18/2018 11:54 AM Injection made incrementally with aspirations every 5 mL.  Performed by: Personally   Additional Notes: Functioning IV was confirmed and monitors applied.  A 85mm 22ga echogenic arrow stimulator was used. Sterile prep and drape,hand hygiene and sterile gloves were used.Ultrasound guidance: relevent anatomy identified, needle position confirmed, local anesthetic spread visualized around nerve(s)., vascular puncture avoided.  Image printed for medical record.  Negative aspiration and negative test dose prior to incremental administration of local anesthetic. The patient tolerated the procedure well.

## 2018-10-18 NOTE — H&P (Signed)
Erik [name] is an 63 y.o. male.   Chief Complaint: right wrist fracture HPI: 63 yo male states he fell from standing height one week ago injuring right wrist.  Seen in ED where XR revealed right distal radius fracture.  Splinted and followed up in office.  He wishes to proceed with operative fixation.  Allergies: No Known Allergies  Past Medical History:  Diagnosis Date  . Abnormal stress echocardiogram   . Adenocarcinoma of transverse colon ([place]) 02/10/2015  . Alcohol abuse    Heavy Use up until 2010  . Anxiety   . Blood transfusion without reported diagnosis   . Cancer of overlapping sites of colon metastatic to intra-abdominal lymph node ([place]) 12/26/2017  . COPD (chronic obstructive pulmonary disease) ([place])   . Depression   . Emphysema of lung ([place])   . GERD (gastroesophageal reflux disease)   . Head trauma 2001   closed head injury; coma for 4 weeks  . Hypercholesterolemia   . Hypertension   . ING HERN W/GANGREN RECUR UNILAT/UNSPEC ING HERN 04/21/2009   Qualifier: Diagnosis of  By: Verl Blalock, MD, Delanna Ahmadi PTSD (post-traumatic stress disorder)   . Pulmonary nodule 06/14/2016  . SAH (subarachnoid hemorrhage) ([place]) 08/31/2012  . SDH (subdural hematoma) ([place]) 08/31/2012    Past Surgical History:  Procedure Laterality Date  . BIOPSY  01/25/2016   Procedure: BIOPSY;  Surgeon: Danie Binder, MD;  [facility];  Service: Endoscopy;;  ileum;   . cardiac cath    . COLONOSCOPY  2011   Dr. Oneida Alar: multiple adenomas and hyperplastic polyps  . COLONOSCOPY WITH PROPOFOL N/A 01/12/2015   Procedure: COLONOSCOPY WITH PROPOFOL;  Surgeon: Danie Binder, MD;  [facility];  Service: Endoscopy;  Laterality: N/A;  1030  . COLONOSCOPY WITH PROPOFOL N/A 01/25/2016   Procedure: COLONOSCOPY WITH PROPOFOL;  Surgeon: Danie Binder, MD;  [facility];  Service: Endoscopy;  Laterality: N/A;  1230  . CRANIOTOMY  2001  . ESOPHAGOGASTRODUODENOSCOPY (EGD) WITH  PROPOFOL N/A 01/12/2015   Procedure: ESOPHAGOGASTRODUODENOSCOPY (EGD) WITH PROPOFOL;  Surgeon: Danie Binder, MD;  [facility];  Service: Endoscopy;  Laterality: N/A;  . head injury surgery    . HERNIA REPAIR Right 2012   Inguinal- Forestine Na  . KIDNEY SURGERY     >30 years ago  . LAPAROSCOPIC RIGHT HEMI COLECTOMY Left 02/10/2015   Procedure: LAPAROSCOPIC THEN OPEN RIGHT HEMI COLECTOMY;  Surgeon: [name], MD;  Location: [facility] OR[facility];  Service: General;  Laterality: Left;  . LAPAROTOMY N/A 12/19/2017   Procedure: EXPLORATORY LAPAROTOMY;  Surgeon: Aviva Signs, MD;  Location: [facility];  Service: General;  Laterality: N/A;  . POLYPECTOMY  01/25/2016   Procedure: POLYPECTOMY;  Surgeon: Danie Binder, MD;  [facility];  Service: Endoscopy;;  colon  . PORT-A-CATH REMOVAL N/A 02/26/2017   Procedure: MINOR REMOVAL PORT-A-CATH;  Surgeon: Aviva Signs, MD;  Location: [facility];  Service: General;  Laterality: N/A;  Pt to arrive at [place] N/A 03/24/2015   Procedure: INSERTION PORT-A-CATH;  Surgeon: Jules Husbands, MD;  Location: [facility];  Service: General;  Laterality: N/A;  . PORTACATH PLACEMENT Left 01/07/2018   Procedure: INSERTION PORT A CATH (ATTACHED CATHETER IN LEFT SUBCLAVIAN);  Surgeon: Aviva Signs, MD;  Location: [facility];  Service: General;  Laterality: Left;    Family History: Family History  Problem Relation Age of Onset  . Pulmonary embolism Mother   .  Breast cancer Mother   . Cancer Mother        breast cancer  . Arthritis Mother   . Heart disease Father   . Cancer Father 75       Leukemia  . Cancer Paternal Grandfather        Lung  . Ataxia Neg Hx   . Chorea Neg Hx   . Dementia Neg Hx   . Mental retardation Neg Hx   . Migraines Neg Hx   . Multiple sclerosis Neg Hx   . Neurofibromatosis Neg Hx   . Neuropathy Neg Hx   . Parkinsonism Neg Hx   . Seizures Neg Hx   . Stroke Neg Hx   . Colon cancer Neg Hx     Social History:    reports that he quit smoking about 10 months ago. His smoking use included cigarettes. He started smoking about 45 years ago. He has a 3.00 pack-year smoking history. He has never used smokeless tobacco. He reports current alcohol use. He reports that he does not use drugs.  Medications: Medications Prior to Admission  Medication Sig Dispense Refill  . amitriptyline (ELAVIL) 25 MG tablet Take 1 tablet (25 mg total) by mouth at bedtime. 90 tablet 3  . atorvastatin (LIPITOR) 20 MG tablet TAKE 1 TABLET BY MOUTH AT BEDTIME 90 tablet 3  . buPROPion (WELLBUTRIN SR) 150 MG 12 hr tablet Take 150 mg by mouth every morning.     . busPIRone (BUSPAR) 10 MG tablet Take 10 mg by mouth 2 (two) times daily.    . fluorouracil CALGB 16109 in sodium chloride 0.9 % 150 mL Inject 5,650 mg into the vein. Over 46 hours    . FLUoxetine (PROZAC) 20 MG capsule TAKE 1 CAPSULE BY MOUTH 2 TIMES A DAY (Patient taking differently: Take 20 mg by mouth 2 (two) times daily. ) 60 capsule 0  . gabapentin (NEURONTIN) 300 MG capsule TAKE 1 CAPSULE BY MOUTH THREE TIMES A DAY 120 capsule 6  . IRINOTECAN HCL IV Inject 420 mg into the vein every 14 (fourteen) days.    Marland Kitchen LEUCOVORIN CALCIUM IV Inject 944 mg into the vein every 14 (fourteen) days.    Marland Kitchen lidocaine-prilocaine (EMLA) cream APPLY A SMALL AMOUNT OVER PORT SITE AND COVER WITH PLASTIC WRAP ONE HOUR PRIOR TO APPOINTMENT 30 g 0  . loperamide (IMODIUM) 2 MG capsule 2 tablets twice daily 60 capsule 3  . omeprazole (PRILOSEC) 20 MG capsule Take 20 mg by mouth every morning.     . ondansetron (ZOFRAN) 8 MG tablet Take 1 tablet (8 mg total) by mouth 2 (two) times daily as needed for refractory nausea / vomiting. Start on day 3 after chemotherapy. 30 tablet 1  . oxyCODONE-acetaminophen (PERCOCET/ROXICET) 5-325 MG tablet Take 1-2 tablets by mouth every 6 (six) hours as needed for severe pain. 15 tablet 0  . PROAIR HFA 108 (90 Base) MCG/ACT inhaler INHALE 2 PUFFS BY MOUTH EVERY 6 HOURS AS  NEEDED FOR SHORTNESS OF BREATH/WHEEZING. 18 each 2  . prochlorperazine (COMPAZINE) 10 MG tablet Take 1 tablet (10 mg total) by mouth every 6 (six) hours as needed (NAUSEA). 30 tablet 1  . rivaroxaban (XARELTO) 20 MG TABS tablet Take 1 tablet (20 mg total) by mouth daily with supper. 15 tablet 0    No results found for this or any previous visit (from the past 48 hour(s)).  No results found.   A comprehensive review of systems was negative except for: Gastrointestinal: positive  for diarrhea  Blood pressure (!) 166/86, pulse (!) 105, temperature 98.8 F (37.1 C), temperature source Oral, resp. rate 18, height 6\' 2"  (1.88 m), weight 100.1 kg, SpO2 97 %.  General appearance: alert, cooperative and appears stated age Head: Normocephalic, without obvious abnormality, atraumatic Neck: supple, symmetrical, trachea midline Cardio: regular rate and rhythm Resp: clear to auscultation bilaterally Extremities: Intact sensation and capillary refill all digits.  +epl/fpl/io.  No wounds.  Pulses: 2+ and symmetric Skin: Skin color, texture, turgor normal. No rashes or lesions Neurologic: Grossly normal Incision/Wound: none  Assessment/Plan Right distal radius fracture.  Non operative and operative treatment options have been discussed with the patient and patient wishes to proceed with operative treatment. Risks, benefits and alternatives of surgery were discussed including risks of blood loss, infection, damage to nerves/vessels/tendons/ligament/bone, failure of surgery, need for additional surgery, complication with wound healing, stiffness, nonunion, malunion.  He voiced understanding of these risks and elected to proceed.    [name] 10/18/2018, 11:41 AM

## 2018-10-18 NOTE — Transfer of Care (Signed)
Immediate Anesthesia Transfer of Care Note  Patient: Erik [name]  Procedure(s) Performed: OPEN REDUCTION INTERNAL FIXATION (ORIF) RIGHT DISTAL RADIAL FRACTURE (Right Wrist)  Patient Location: PACU  Anesthesia Type:MAC and Regional  Level of Consciousness: awake, alert  and oriented  Airway & Oxygen Therapy: Patient Spontanous Breathing and Patient connected to face mask oxygen  Post-op Assessment: Report given to RN and Post -op Vital signs reviewed and stable  Post vital signs: Reviewed and stable  Last Vitals:  Vitals Value Taken Time  BP    Temp    Pulse 80 10/18/18 1330  Resp 21 10/18/18 1330  SpO2 100 % 10/18/18 1330  Vitals shown include unvalidated device data.  Last Pain:  Vitals:   10/18/18 1112  TempSrc: Oral  PainSc: 8       Patients Stated Pain Goal: 4 (14/48/18 5631)  Complications: No apparent anesthesia complications

## 2018-10-18 NOTE — Op Note (Signed)
Intra-operative fluoroscopic images in the AP, lateral, and oblique views were taken and evaluated by myself.  Reduction and hardware placement were confirmed.  There was no intraarticular penetration of permanent hardware.  

## 2018-10-18 NOTE — Op Note (Signed)
I assisted Surgeon(s) and Role:    * [name], MD - Primary    Daryll Brod, MD - Assisting on the Procedure(s): OPEN REDUCTION INTERNAL FIXATION (ORIF) RIGHT DISTAL RADIAL FRACTURE on 10/18/2018.  I provided assistance on this case as follows: Set up, approach, debridement of fracture, manipulation of fracture,  reduction of the fracture, stabilization of the fracture, fixation of the fracture with plate and screws, closure of the wound, application of dressing and splint.  Electronically signed by: Daryll Brod, MD Date: 10/18/2018 Time: 1:30 PM

## 2018-10-21 ENCOUNTER — Encounter (HOSPITAL_BASED_OUTPATIENT_CLINIC_OR_DEPARTMENT_OTHER): Payer: Self-pay | Admitting: Orthopedic Surgery

## 2018-10-23 ENCOUNTER — Other Ambulatory Visit (HOSPITAL_COMMUNITY): Payer: Medicaid Other

## 2018-10-23 ENCOUNTER — Ambulatory Visit (HOSPITAL_COMMUNITY): Payer: Medicaid Other | Admitting: Hematology

## 2018-10-23 ENCOUNTER — Ambulatory Visit (HOSPITAL_COMMUNITY): Payer: Medicaid Other

## 2018-10-25 ENCOUNTER — Encounter (HOSPITAL_COMMUNITY): Payer: Medicaid Other

## 2018-10-29 NOTE — Progress Notes (Signed)
11/05/18  .The following biosimilar bevacizumab-bvzr Noah Charon) has been selected for use in this patient.  Henreitta Leber, PharmD

## 2018-11-01 ENCOUNTER — Other Ambulatory Visit (HOSPITAL_COMMUNITY): Payer: Self-pay | Admitting: Hematology

## 2018-11-01 DIAGNOSIS — C184 Malignant neoplasm of transverse colon: Secondary | ICD-10-CM

## 2018-11-05 ENCOUNTER — Inpatient Hospital Stay (HOSPITAL_BASED_OUTPATIENT_CLINIC_OR_DEPARTMENT_OTHER): Payer: Medicaid Other | Admitting: Hematology

## 2018-11-05 ENCOUNTER — Inpatient Hospital Stay (HOSPITAL_COMMUNITY): Payer: Medicaid Other

## 2018-11-05 ENCOUNTER — Encounter (HOSPITAL_COMMUNITY): Payer: Self-pay | Admitting: Hematology

## 2018-11-05 ENCOUNTER — Other Ambulatory Visit: Payer: Self-pay

## 2018-11-05 VITALS — BP 143/82 | HR 96 | Temp 97.7°F | Resp 18 | Wt 216.6 lb

## 2018-11-05 VITALS — BP 133/73 | HR 80 | Temp 96.9°F | Resp 18

## 2018-11-05 DIAGNOSIS — C184 Malignant neoplasm of transverse colon: Secondary | ICD-10-CM | POA: Diagnosis not present

## 2018-11-05 DIAGNOSIS — Z5112 Encounter for antineoplastic immunotherapy: Secondary | ICD-10-CM | POA: Diagnosis present

## 2018-11-05 DIAGNOSIS — C772 Secondary and unspecified malignant neoplasm of intra-abdominal lymph nodes: Secondary | ICD-10-CM | POA: Diagnosis not present

## 2018-11-05 DIAGNOSIS — C188 Malignant neoplasm of overlapping sites of colon: Secondary | ICD-10-CM

## 2018-11-05 DIAGNOSIS — Z5111 Encounter for antineoplastic chemotherapy: Secondary | ICD-10-CM | POA: Diagnosis not present

## 2018-11-05 DIAGNOSIS — C786 Secondary malignant neoplasm of retroperitoneum and peritoneum: Secondary | ICD-10-CM | POA: Diagnosis not present

## 2018-11-05 LAB — URINALYSIS, DIPSTICK ONLY
Bilirubin Urine: NEGATIVE
Glucose, UA: NEGATIVE mg/dL
Hgb urine dipstick: NEGATIVE
Ketones, ur: NEGATIVE mg/dL
Nitrite: NEGATIVE
Protein, ur: NEGATIVE mg/dL
Specific Gravity, Urine: 1.018 (ref 1.005–1.030)
pH: 5 (ref 5.0–8.0)

## 2018-11-05 LAB — CBC WITH DIFFERENTIAL/PLATELET
Abs Immature Granulocytes: 0.04 10*3/uL (ref 0.00–0.07)
Basophils Absolute: 0 10*3/uL (ref 0.0–0.1)
Basophils Relative: 1 %
Eosinophils Absolute: 0.1 10*3/uL (ref 0.0–0.5)
Eosinophils Relative: 1 %
HCT: 45.2 % (ref 39.0–52.0)
Hemoglobin: 14.8 g/dL (ref 13.0–17.0)
Immature Granulocytes: 1 %
Lymphocytes Relative: 15 %
Lymphs Abs: 1.1 10*3/uL (ref 0.7–4.0)
MCH: 33.1 pg (ref 26.0–34.0)
MCHC: 32.7 g/dL (ref 30.0–36.0)
MCV: 101.1 fL — ABNORMAL HIGH (ref 80.0–100.0)
Monocytes Absolute: 0.7 10*3/uL (ref 0.1–1.0)
Monocytes Relative: 10 %
Neutro Abs: 5.5 10*3/uL (ref 1.7–7.7)
Neutrophils Relative %: 72 %
Platelets: 197 10*3/uL (ref 150–400)
RBC: 4.47 MIL/uL (ref 4.22–5.81)
RDW: 14.3 % (ref 11.5–15.5)
WBC: 7.4 10*3/uL (ref 4.0–10.5)
nRBC: 0 % (ref 0.0–0.2)

## 2018-11-05 LAB — COMPREHENSIVE METABOLIC PANEL
ALT: 13 U/L (ref 0–44)
AST: 17 U/L (ref 15–41)
Albumin: 3.6 g/dL (ref 3.5–5.0)
Alkaline Phosphatase: 85 U/L (ref 38–126)
Anion gap: 12 (ref 5–15)
BUN: 10 mg/dL (ref 6–20)
CO2: 26 mmol/L (ref 22–32)
Calcium: 9.3 mg/dL (ref 8.9–10.3)
Chloride: 97 mmol/L — ABNORMAL LOW (ref 98–111)
Creatinine, Ser: 0.81 mg/dL (ref 0.61–1.24)
GFR calc Af Amer: >60 mL/min (ref >60)
GFR calc non Af Amer: >60 mL/min (ref >60)
Glucose, Bld: 129 mg/dL — ABNORMAL HIGH (ref 70–99)
Potassium: 3.2 mmol/L — ABNORMAL LOW (ref 3.5–5.1)
Sodium: 135 mmol/L (ref 135–145)
Total Bilirubin: 0.8 mg/dL (ref 0.3–1.2)
Total Protein: 6.6 g/dL (ref 6.5–8.1)

## 2018-11-05 MED ORDER — ATROPINE SULFATE 1 MG/ML IJ SOLN
0.5000 mg | Freq: Once | INTRAMUSCULAR | Status: AC
  Administered 2018-11-05: 0.5 mg via INTRAVENOUS
  Filled 2018-11-05: qty 1

## 2018-11-05 MED ORDER — SODIUM CHLORIDE 0.9 % IV SOLN
Freq: Once | INTRAVENOUS | Status: AC
  Administered 2018-11-05: 10:00:00 via INTRAVENOUS

## 2018-11-05 MED ORDER — SODIUM CHLORIDE 0.9% FLUSH
10.0000 mL | INTRAVENOUS | Status: DC | PRN
  Administered 2018-11-05: 10 mL
  Filled 2018-11-05: qty 10

## 2018-11-05 MED ORDER — SODIUM CHLORIDE 0.9 % IV SOLN
900.0000 mg | Freq: Once | INTRAVENOUS | Status: AC
  Administered 2018-11-05: 900 mg via INTRAVENOUS
  Filled 2018-11-05: qty 10

## 2018-11-05 MED ORDER — SODIUM CHLORIDE 0.9 % IV SOLN
10.0000 mg | Freq: Once | INTRAVENOUS | Status: AC
  Administered 2018-11-05: 10:00:00 10 mg via INTRAVENOUS
  Filled 2018-11-05: qty 10

## 2018-11-05 MED ORDER — SODIUM CHLORIDE 0.9 % IV SOLN
2400.0000 mg/m2 | INTRAVENOUS | Status: DC
  Administered 2018-11-05: 5650 mg via INTRAVENOUS
  Filled 2018-11-05: qty 113

## 2018-11-05 MED ORDER — PALONOSETRON HCL INJECTION 0.25 MG/5ML
0.2500 mg | Freq: Once | INTRAVENOUS | Status: AC
  Administered 2018-11-05: 10:00:00 0.25 mg via INTRAVENOUS
  Filled 2018-11-05: qty 5

## 2018-11-05 MED ORDER — FLUOROURACIL CHEMO INJECTION 2.5 GM/50ML
400.0000 mg/m2 | Freq: Once | INTRAVENOUS | Status: AC
  Administered 2018-11-05: 12:00:00 950 mg via INTRAVENOUS
  Filled 2018-11-05: qty 19

## 2018-11-05 NOTE — Patient Instructions (Signed)
[facility] at [facility] Discharge Instructions  Labs drawn from portacath today   Thank you for choosing  Cancer Center at [facility] to provide your oncology and hematology care.  To afford each patient quality time with our provider, please arrive at least 15 minutes before your scheduled appointment time.   If you have a lab appointment with the Cancer Center please come in thru the Main Entrance and check in at the main information desk.  You need to re-schedule your appointment should you arrive 10 or more minutes late.  We strive to give you quality time with our providers, and arriving late affects you and other patients whose appointments are after yours.  Also, if you no show three or more times for appointments you may be dismissed from the clinic at the providers discretion.     Again, thank you for choosing [facility].  Our hope is that these requests will decrease the amount of time that you wait before being seen by our physicians.       _____________________________________________________________  Should you have questions after your visit to [facility], please contact our office at [phone] between the hours of 8:00 a.m. and 4:30 p.m.  Voicemails left after 4:00 p.m. will not be returned until the following business day.  For prescription refill requests, have your pharmacy contact our office and allow 72 hours.    Due to Covid, you will need to wear a mask upon entering the hospital. If you do not have a mask, a mask will be given to you at the Main Entrance upon arrival. For doctor visits, patients may have 1 support person with them. For treatment visits, patients can not have anyone with them due to social distancing guidelines and our immunocompromised population.     

## 2018-11-05 NOTE — Patient Instructions (Addendum)
[facility] at [facility] Discharge Instructions  You were seen today by Dr. Katragadda. He went over your recent lab results. He will see you back in 2 weeks for labs, treatment and follow up.   Thank you for choosing [facility] at [facility] to provide your oncology and hematology care.  To afford each patient quality time with our provider, please arrive at least 15 minutes before your scheduled appointment time.   If you have a lab appointment with the Cancer Center please come in thru the  Main Entrance and check in at the main information desk  You need to re-schedule your appointment should you arrive 10 or more minutes late.  We strive to give you quality time with our providers, and arriving late affects you and other patients whose appointments are after yours.  Also, if you no show three or more times for appointments you may be dismissed from the clinic at the providers discretion.     Again, thank you for choosing [facility].  Our hope is that these requests will decrease the amount of time that you wait before being seen by our physicians.       _____________________________________________________________  Should you have questions after your visit to [facility], please contact our office at [phone] between the hours of 8:00 a.m. and 4:30 p.m.  Voicemails left after 4:00 p.m. will not be returned until the following business day.  For prescription refill requests, have your pharmacy contact our office and allow 72 hours.    Cancer Center Support Programs:   > Cancer Support Group  2nd Tuesday of the month 1pm-2pm, Journey Room    

## 2018-11-05 NOTE — Patient Instructions (Signed)
[facility] Discharge Instructions for Patients Receiving Chemotherapy  Today you received the following chemotherapy agents   To help prevent nausea and vomiting after your treatment, we encourage you to take your nausea medication   If you develop nausea and vomiting that is not controlled by your nausea medication, call the clinic.   BELOW ARE SYMPTOMS THAT SHOULD BE REPORTED IMMEDIATELY:  *FEVER GREATER THAN 100.5 F  *CHILLS WITH OR WITHOUT FEVER  NAUSEA AND VOMITING THAT IS NOT CONTROLLED WITH YOUR NAUSEA MEDICATION  *UNUSUAL SHORTNESS OF BREATH  *UNUSUAL BRUISING OR BLEEDING  TENDERNESS IN MOUTH AND THROAT WITH OR WITHOUT PRESENCE OF ULCERS  *URINARY PROBLEMS  *BOWEL PROBLEMS  UNUSUAL RASH Items with * indicate a potential emergency and should be followed up as soon as possible.  Feel free to call the clinic should you have any questions or concerns. The clinic phone number is [phone].  Please show the CHEMO ALERT CARD at check-in to the Emergency Department and triage nurse.   

## 2018-11-05 NOTE — Progress Notes (Signed)
Labs reviewed at office visit this morning with MD. Proceed with treatment per orders.   Verbal order to hold avastin today due to patient having surgery recently.  Treatment given per orders. Patient tolerated it well without problems. Vitals stable and discharged home from clinic ambulatory. Follow up as scheduled.

## 2018-11-06 LAB — CEA: CEA: 6.7 ng/mL — ABNORMAL HIGH (ref 0.0–4.7)

## 2018-11-07 ENCOUNTER — Other Ambulatory Visit: Payer: Self-pay

## 2018-11-07 ENCOUNTER — Inpatient Hospital Stay (HOSPITAL_COMMUNITY): Payer: Medicaid Other | Attending: Hematology

## 2018-11-07 VITALS — BP 120/68 | HR 81 | Temp 98.9°F | Resp 18

## 2018-11-07 DIAGNOSIS — G620 Drug-induced polyneuropathy: Secondary | ICD-10-CM | POA: Diagnosis not present

## 2018-11-07 DIAGNOSIS — T451X5A Adverse effect of antineoplastic and immunosuppressive drugs, initial encounter: Secondary | ICD-10-CM | POA: Diagnosis not present

## 2018-11-07 DIAGNOSIS — Z5111 Encounter for antineoplastic chemotherapy: Secondary | ICD-10-CM | POA: Diagnosis not present

## 2018-11-07 DIAGNOSIS — C184 Malignant neoplasm of transverse colon: Secondary | ICD-10-CM | POA: Diagnosis present

## 2018-11-07 DIAGNOSIS — Z5112 Encounter for antineoplastic immunotherapy: Secondary | ICD-10-CM | POA: Insufficient documentation

## 2018-11-07 DIAGNOSIS — Z23 Encounter for immunization: Secondary | ICD-10-CM | POA: Diagnosis not present

## 2018-11-07 DIAGNOSIS — E538 Deficiency of other specified B group vitamins: Secondary | ICD-10-CM

## 2018-11-07 DIAGNOSIS — C188 Malignant neoplasm of overlapping sites of colon: Secondary | ICD-10-CM

## 2018-11-07 MED ORDER — CYANOCOBALAMIN 1000 MCG/ML IJ SOLN
INTRAMUSCULAR | Status: AC
  Filled 2018-11-07: qty 1

## 2018-11-07 MED ORDER — CYANOCOBALAMIN 1000 MCG/ML IJ SOLN
1000.0000 ug | Freq: Once | INTRAMUSCULAR | Status: AC
  Administered 2018-11-07: 13:00:00 1000 ug via INTRAMUSCULAR

## 2018-11-07 MED ORDER — HEPARIN SOD (PORK) LOCK FLUSH 100 UNIT/ML IV SOLN
500.0000 [IU] | Freq: Once | INTRAVENOUS | Status: AC | PRN
  Administered 2018-11-07: 500 [IU]

## 2018-11-07 MED ORDER — SODIUM CHLORIDE 0.9% FLUSH
10.0000 mL | INTRAVENOUS | Status: AC | PRN
  Administered 2018-11-07: 10 mL
  Filled 2018-11-07: qty 10

## 2018-11-19 ENCOUNTER — Encounter (HOSPITAL_COMMUNITY): Payer: Self-pay | Admitting: Hematology

## 2018-11-19 ENCOUNTER — Other Ambulatory Visit: Payer: Self-pay

## 2018-11-19 ENCOUNTER — Inpatient Hospital Stay (HOSPITAL_COMMUNITY): Payer: Medicaid Other

## 2018-11-19 ENCOUNTER — Inpatient Hospital Stay (HOSPITAL_BASED_OUTPATIENT_CLINIC_OR_DEPARTMENT_OTHER): Payer: Medicaid Other | Admitting: Hematology

## 2018-11-19 VITALS — BP 148/73 | HR 91 | Temp 97.9°F | Resp 18

## 2018-11-19 VITALS — BP 131/66 | HR 88 | Temp 97.3°F | Resp 18 | Wt 227.6 lb

## 2018-11-19 DIAGNOSIS — C772 Secondary and unspecified malignant neoplasm of intra-abdominal lymph nodes: Secondary | ICD-10-CM

## 2018-11-19 DIAGNOSIS — C188 Malignant neoplasm of overlapping sites of colon: Secondary | ICD-10-CM

## 2018-11-19 DIAGNOSIS — C184 Malignant neoplasm of transverse colon: Secondary | ICD-10-CM | POA: Diagnosis not present

## 2018-11-19 DIAGNOSIS — Z5111 Encounter for antineoplastic chemotherapy: Secondary | ICD-10-CM | POA: Diagnosis not present

## 2018-11-19 LAB — CBC WITH DIFFERENTIAL/PLATELET
Abs Immature Granulocytes: 0.01 10*3/uL (ref 0.00–0.07)
Basophils Absolute: 0 10*3/uL (ref 0.0–0.1)
Basophils Relative: 1 %
Eosinophils Absolute: 0.2 10*3/uL (ref 0.0–0.5)
Eosinophils Relative: 3 %
HCT: 42.6 % (ref 39.0–52.0)
Hemoglobin: 13.4 g/dL (ref 13.0–17.0)
Immature Granulocytes: 0 %
Lymphocytes Relative: 19 %
Lymphs Abs: 1 10*3/uL (ref 0.7–4.0)
MCH: 32.3 pg (ref 26.0–34.0)
MCHC: 31.5 g/dL (ref 30.0–36.0)
MCV: 102.7 fL — ABNORMAL HIGH (ref 80.0–100.0)
Monocytes Absolute: 0.5 10*3/uL (ref 0.1–1.0)
Monocytes Relative: 11 %
Neutro Abs: 3.3 10*3/uL (ref 1.7–7.7)
Neutrophils Relative %: 66 %
Platelets: 172 10*3/uL (ref 150–400)
RBC: 4.15 MIL/uL — ABNORMAL LOW (ref 4.22–5.81)
RDW: 15.4 % (ref 11.5–15.5)
WBC: 5 10*3/uL (ref 4.0–10.5)
nRBC: 0 % (ref 0.0–0.2)

## 2018-11-19 LAB — COMPREHENSIVE METABOLIC PANEL
ALT: 14 U/L (ref 0–44)
AST: 16 U/L (ref 15–41)
Albumin: 3.5 g/dL (ref 3.5–5.0)
Alkaline Phosphatase: 76 U/L (ref 38–126)
Anion gap: 11 (ref 5–15)
BUN: 12 mg/dL (ref 6–20)
CO2: 26 mmol/L (ref 22–32)
Calcium: 9.1 mg/dL (ref 8.9–10.3)
Chloride: 103 mmol/L (ref 98–111)
Creatinine, Ser: 0.71 mg/dL (ref 0.61–1.24)
GFR calc Af Amer: >60 mL/min (ref >60)
GFR calc non Af Amer: >60 mL/min (ref >60)
Glucose, Bld: 105 mg/dL — ABNORMAL HIGH (ref 70–99)
Potassium: 4.3 mmol/L (ref 3.5–5.1)
Sodium: 140 mmol/L (ref 135–145)
Total Bilirubin: 0.5 mg/dL (ref 0.3–1.2)
Total Protein: 6.5 g/dL (ref 6.5–8.1)

## 2018-11-19 MED ORDER — SODIUM CHLORIDE 0.9 % IV SOLN
Freq: Once | INTRAVENOUS | Status: AC
  Administered 2018-11-19: 10:00:00 via INTRAVENOUS

## 2018-11-19 MED ORDER — SODIUM CHLORIDE 0.9% FLUSH
10.0000 mL | INTRAVENOUS | Status: DC | PRN
  Administered 2018-11-19: 10 mL
  Filled 2018-11-19: qty 10

## 2018-11-19 MED ORDER — LEUCOVORIN CALCIUM INJECTION 350 MG
900.0000 mg | Freq: Once | INTRAVENOUS | Status: AC
  Administered 2018-11-19: 900 mg via INTRAVENOUS
  Filled 2018-11-19: qty 45

## 2018-11-19 MED ORDER — INFLUENZA VAC SPLIT QUAD 0.5 ML IM SUSY
0.5000 mL | PREFILLED_SYRINGE | Freq: Once | INTRAMUSCULAR | Status: AC
  Administered 2018-11-19: 0.5 mL via INTRAMUSCULAR

## 2018-11-19 MED ORDER — INFLUENZA VAC SPLIT QUAD 0.5 ML IM SUSY
PREFILLED_SYRINGE | INTRAMUSCULAR | Status: AC
  Filled 2018-11-19: qty 0.5

## 2018-11-19 MED ORDER — SODIUM CHLORIDE 0.9 % IV SOLN
5.0000 mg/kg | Freq: Once | INTRAVENOUS | Status: AC
  Administered 2018-11-19: 500 mg via INTRAVENOUS
  Filled 2018-11-19: qty 16

## 2018-11-19 MED ORDER — PALONOSETRON HCL INJECTION 0.25 MG/5ML
INTRAVENOUS | Status: AC
  Filled 2018-11-19: qty 5

## 2018-11-19 MED ORDER — PALONOSETRON HCL INJECTION 0.25 MG/5ML
0.2500 mg | Freq: Once | INTRAVENOUS | Status: AC
  Administered 2018-11-19: 0.25 mg via INTRAVENOUS

## 2018-11-19 MED ORDER — SODIUM CHLORIDE 0.9 % IV SOLN
2400.0000 mg/m2 | INTRAVENOUS | Status: DC
  Administered 2018-11-19: 5650 mg via INTRAVENOUS
  Filled 2018-11-19: qty 113

## 2018-11-19 MED ORDER — ATROPINE SULFATE 1 MG/ML IJ SOLN
0.5000 mg | Freq: Once | INTRAMUSCULAR | Status: AC
  Administered 2018-11-19: 0.5 mg via INTRAVENOUS
  Filled 2018-11-19: qty 1

## 2018-11-19 MED ORDER — SODIUM CHLORIDE 0.9 % IV SOLN
10.0000 mg | Freq: Once | INTRAVENOUS | Status: AC
  Administered 2018-11-19: 10 mg via INTRAVENOUS
  Filled 2018-11-19: qty 10

## 2018-11-19 MED ORDER — FLUOROURACIL CHEMO INJECTION 2.5 GM/50ML
400.0000 mg/m2 | Freq: Once | INTRAVENOUS | Status: AC
  Administered 2018-11-19: 950 mg via INTRAVENOUS
  Filled 2018-11-19: qty 19

## 2018-11-19 NOTE — Patient Instructions (Signed)
[facility] at [facility] Discharge Instructions  Labs drawn from portacath today   Thank you for choosing [facility] at [facility] to provide your oncology and hematology care.  To afford each patient quality time with our provider, please arrive at least 15 minutes before your scheduled appointment time.   If you have a lab appointment with the Cancer Center please come in thru the Main Entrance and check in at the main information desk.  You need to re-schedule your appointment should you arrive 10 or more minutes late.  We strive to give you quality time with our providers, and arriving late affects you and other patients whose appointments are after yours.  Also, if you no show three or more times for appointments you may be dismissed from the clinic at the providers discretion.     Again, thank you for choosing [facility].  Our hope is that these requests will decrease the amount of time that you wait before being seen by our physicians.       _____________________________________________________________  Should you have questions after your visit to [facility], please contact our office at [phone] between the hours of 8:00 a.m. and 4:30 p.m.  Voicemails left after 4:00 p.m. will not be returned until the following business day.  For prescription refill requests, have your pharmacy contact our office and allow 72 hours.    Due to Covid, you will need to wear a mask upon entering the hospital. If you do not have a mask, a mask will be given to you at the Main Entrance upon arrival. For doctor visits, patients may have 1 support person with them. For treatment visits, patients can not have anyone with them due to social distancing guidelines and our immunocompromised population.     

## 2018-11-19 NOTE — Assessment & Plan Note (Signed)
1.  Metastatic colon cancer, K-ras mutation positive, MSI stable: -Status post FOLFOX for 12 cycles and adjuvant setting from 03/30/2015 through 09/06/2015. - PET scan on 11/19/2017 showing peritoneal carcinomatosis, right paratracheal lymph node, nonspecific increased uptake in the right hilar region.  CEA was 12.9 on 11/16/2017. -9 cycles of FOLFIRI from 01/09/2018 through 05/08/2018 with addition of bevacizumab during cycle 5. - Maintenance 5-FU/leucovorin and bevacizumab started on 05/22/2018. - Last CEA on 11/05/2018 was 6.7. -CT CAP on 08/26/2018 showed stable peritoneal metastasis with peritoneal implants and mesenteric lesions.  No new progressive findings.  No hepatic metastatic disease.  Few scattered tiny lung nodules with no evidence of pulmonary metastasis. - He had a fracture of the right wrist with ORIF on 10/18/2018. - He tolerated last treatment very well.  He will proceed with his next treatment today.  We have reviewed his labs which are within normal limits. -We will see him back in 2 weeks.  We will repeat his CT scans prior to next visit.  2.  Pulmonary embolism: -Incidental PE on CT chest on 05/20/2018.  CT angiogram confirmed bilateral PE. -He is on Xarelto and is tolerating it very well.  3.  Peripheral neuropathy: -Pre-existing neuropathy from prior oxaliplatin.  Numbness in the legs has been stable.  4.  Diarrhea: -Continue Lomotil 2 tablets twice daily.  Is well controlled.

## 2018-11-19 NOTE — Patient Instructions (Addendum)
[place] at [facility] Discharge Instructions  You were seen today by Dr. Delton Coombes. He went over your recent lab results. He will schedule you for repeat scans prior to your next visit. He will see you back in 2 weeks for labs, treatment and follow up.   Thank you for choosing [place] at [facility] to provide your oncology and hematology care.  To afford each patient quality time with our provider, please arrive at least 15 minutes before your scheduled appointment time.   If you have a lab appointment with the [facility] please come in thru the  Main Entrance and check in at the main information desk  You need to re-schedule your appointment should you arrive 10 or more minutes late.  We strive to give you quality time with our providers, and arriving late affects you and other patients whose appointments are after yours.  Also, if you no show three or more times for appointments you may be dismissed from the clinic at the providers discretion.     Again, thank you for choosing [facility].  Our hope is that these requests will decrease the amount of time that you wait before being seen by our physicians.       _____________________________________________________________  Should you have questions after your visit to [facility], please contact our office at [phone] between the hours of 8:00 a.m. and 4:30 p.m.  Voicemails left after 4:00 p.m. will not be returned until the following business day.  For prescription refill requests, have your pharmacy contact our office and allow 72 hours.    Cancer Center Support Programs:   > Cancer Support Group  2nd Tuesday of the month 1pm-2pm, Journey Room

## 2018-11-19 NOTE — Progress Notes (Signed)
0924 Labs reviewed with and pt seen by Dr. Delton Coombes and pt approved for chemo tx today per MD                            Erik [name] tolerated chemo tx well without complaints or incident. Pt discharged with 5FU pump infusing without issues. VSS upon discharge. Pt discharged via wheelchair in satisfactory condition

## 2018-11-19 NOTE — Patient Instructions (Signed)
[facility] Discharge Instructions for Patients Receiving Chemotherapy   Beginning January 23rd 2017 lab work for the [facility] will be done in the  Main lab at [facility] on 1st floor. If you have a lab appointment with the [facility] please come in thru the  Main Entrance and check in at the main information desk   Today you received the following chemotherapy agents Bevacizumab,Leucovorin and 5FU. Follow-up as scheduled. Call clinic for any questions or concerns  To help prevent nausea and vomiting after your treatment, we encourage you to take your nausea medication   If you develop nausea and vomiting, or diarrhea that is not controlled by your medication, call the clinic.  The clinic phone number is [phone]. Office hours are Monday-Friday 8:30am-5:00pm.  BELOW ARE SYMPTOMS THAT SHOULD BE REPORTED IMMEDIATELY:  *FEVER GREATER THAN 101.0 F  *CHILLS WITH OR WITHOUT FEVER  NAUSEA AND VOMITING THAT IS NOT CONTROLLED WITH YOUR NAUSEA MEDICATION  *UNUSUAL SHORTNESS OF BREATH  *UNUSUAL BRUISING OR BLEEDING  TENDERNESS IN MOUTH AND THROAT WITH OR WITHOUT PRESENCE OF ULCERS  *URINARY PROBLEMS  *BOWEL PROBLEMS  UNUSUAL RASH Items with * indicate a potential emergency and should be followed up as soon as possible. If you have an emergency after office hours please contact your primary care physician or go to the nearest emergency department.  Please call the clinic during office hours if you have any questions or concerns.   You may also contact the Patient Navigator at [phone] should you have any questions or need assistance in obtaining follow up care.      Resources For Cancer Patients and their Caregivers ? American Cancer Society: Can assist with transportation, wigs, general needs, runs Look Good Feel Better.        (952) 677-6934 ? Cancer Care: Provides financial assistance, online support groups, medication/co-pay assistance.   1-800-813-HOPE 669-346-9124) ? [place] Assists [place] cancer patients and their families through emotional , educational and financial support.  754 253 2918 ? [place] DSS Where to apply for food stamps, Medicaid and utility assistance. 276-582-1986 ? [facility]: Transportation to medical appointments. 803-060-5318 ? Social Security Administration: May apply for disability if have a Stage IV cancer. (802) 363-4928 858-660-2911 ? [employer], Disability and Transit Services: Assists with nutrition, care and transit needs. 816-857-6916

## 2018-11-19 NOTE — Progress Notes (Signed)
[place], [place] [address]   CLINIC:  Medical Oncology/Hematology  PCP:  Patient, No Pcp Per No address on file None   REASON FOR VISIT:  Follow-up for metastatic colon cancer   BRIEF ONCOLOGIC HISTORY:  Oncology History Overview Note  Stage IIIC ([id number]) adenocarcinoma of transverse colon, diagnosed on colonoscopy by Dr. Oneida Alar on 01/12/2015 followed by definitive surgery by Dr. [name] with right hemicolectomy on 02/12/2015.  He then underwent FOLFOX x 12 cycles in the adjuvant setting (03/30/2015- 09/06/2015).   Malignant neoplasm of transverse colon ([place])  01/12/2015 Pathologic Stage   Colon, biopsy, distal transverse - TUBULOVILLOUS ADENOMA WITH HIGH GRADE DYSPLASIA.   01/12/2015 Procedure   Colonoscopy by Dr. Oneida Alar.   01/12/2015 Tumor Marker   CEA: 6.6 (H)    01/18/2015 Imaging   CT abd/pelvis- Apple-core lesion identified in the mid transverse colon without obstruction. No evidence for lymphadenopathy in the gastrohepatic ligament or omentum.  Stable 8 mm hypo attenuating lesion in the left liver, likely a cyst.   02/10/2015 Initial Diagnosis   Adenocarcinoma of transverse colon ([place])   02/12/2015 Definitive Surgery   [name], Extended right hemicolectomy    02/12/2015 Pathology Results   Mucinous adenocarcinoma with penetration of visceral peritoneum, 4/19 lymph nodes for metastatic disease, negative resection margins, with LVI and perineural invasion   03/30/2015 - 09/06/2015 Chemotherapy   FOLFOX x 12 cycles   05/25/2015 Treatment Plan Change   5 FU bolus discontinued for cycle #5   06/08/2015 Treatment Plan Change   Treatment deferred x 1 week   06/15/2015 Treatment Plan Change   5FU CI decreased by 10% and Oxaliplatin reduced by 15% for cycles #6-#11; Oxaliplatin dropped for cycle #12 d/t neuropathy.    10/20/2015 Imaging   CT CAP- Right hemicolectomy without evidence of metastatic disease. 2. Previously  measured ground-glass lesion in the left upper lobe has resolved. 3. Probable food debris in the stomach, simulating gastric wall thickening. Please correlate clinically. 4. 6 mm irregular nodular density in the left upper lobe, stable. Continued attention on followup exams is warranted.   01/25/2016 Procedure   Colonoscopy by Dr. Oneida Alar- Non-thrombosed external hemorrhoids found on digital rectal exam. - One 4 mm polyp in the rectum, removed with a cold biopsy forceps. Resected and retrieved. - Congested mucosa in the neo-terminal ileum. Biopsied. - Redundant colon. - Internal hemorrhoids.   01/26/2016 Pathology Results   1. Terminal ileum, biopsy - MILD ACUTE (ACTIVE) ILEITIS. - NO DYSPLASIA OR MALIGNANCY IDENTIFIED - SEE COMMENT. 2. Rectum, polyp(s) - HYPERPLASTIC POLYP (X 1). - NO DYSPLASIA OR MALIGNANCY IDENTIFIED.   04/13/2016 Imaging   CT chest- Stable CT chest. 6 mm irregular nodule anterior left upper lobe is stable. The scattered areas of peribronchovascular micro nodularity in the lungs bilaterally are unchanged.   10/19/2016 Imaging   CT CAP: 1. Status post right hemicolectomy. No findings to suggest metastatic disease in the abdomen or pelvis. 2. Aortic atherosclerosis. 3. Additional incidental findings, as above. Aortic Atherosclerosis (ICD10-I70.0).   01/09/2018 -  Chemotherapy   The patient had palonosetron (ALOXI) injection 0.25 mg, 0.25 mg, Intravenous,  Once, 21 of 23 cycles Administration: 0.25 mg (01/09/2018), 0.25 mg (01/23/2018), 0.25 mg (02/11/2018), 0.25 mg (02/25/2018), 0.25 mg (03/12/2018), 0.25 mg (03/27/2018), 0.25 mg (04/10/2018), 0.25 mg (04/24/2018), 0.25 mg (05/08/2018), 0.25 mg (05/22/2018), 0.25 mg (06/10/2018), 0.25 mg (06/26/2018), 0.25 mg (07/10/2018), 0.25 mg (07/24/2018), 0.25 mg (08/13/2018), 0.25 mg (08/27/2018), 0.25 mg (09/10/2018),  0.25 mg (09/24/2018), 0.25 mg (10/08/2018), 0.25 mg (11/05/2018), 0.25 mg (11/19/2018) irinotecan (CAMPTOSAR) 420 mg in dextrose 5 %  500 mL chemo infusion, 180 mg/m2 = 420 mg, Intravenous,  Once, 9 of 9 cycles Administration: 420 mg (01/09/2018), 420 mg (01/23/2018), 420 mg (02/11/2018), 420 mg (02/25/2018), 420 mg (03/12/2018), 420 mg (03/27/2018), 420 mg (04/10/2018), 420 mg (04/24/2018), 420 mg (05/08/2018) leucovorin 900 mg in dextrose 5 % 250 mL infusion, 944 mg, Intravenous,  Once, 21 of 23 cycles Administration: 900 mg (01/09/2018), 900 mg (01/23/2018), 900 mg (02/11/2018), 900 mg (02/25/2018), 900 mg (03/12/2018), 900 mg (03/27/2018), 900 mg (04/10/2018), 900 mg (04/24/2018), 900 mg (05/08/2018), 900 mg (06/10/2018), 900 mg (06/26/2018), 900 mg (07/10/2018), 900 mg (07/24/2018), 900 mg (08/13/2018), 900 mg (08/27/2018), 900 mg (09/10/2018), 900 mg (09/24/2018), 900 mg (10/08/2018), 900 mg (11/05/2018), 900 mg (11/19/2018) fluorouracil (ADRUCIL) chemo injection 950 mg, 400 mg/m2 = 950 mg, Intravenous,  Once, 21 of 23 cycles Administration: 950 mg (01/09/2018), 950 mg (01/23/2018), 950 mg (02/11/2018), 950 mg (02/25/2018), 950 mg (03/12/2018), 950 mg (03/27/2018), 950 mg (04/10/2018), 950 mg (04/24/2018), 950 mg (05/08/2018), 950 mg (05/22/2018), 950 mg (06/10/2018), 950 mg (06/26/2018), 950 mg (07/10/2018), 950 mg (07/24/2018), 950 mg (08/13/2018), 950 mg (08/27/2018), 950 mg (09/10/2018), 950 mg (09/24/2018), 950 mg (10/08/2018), 950 mg (11/05/2018), 950 mg (11/19/2018) fluorouracil (ADRUCIL) 5,650 mg in sodium chloride 0.9 % 137 mL chemo infusion, 2,400 mg/m2 = 5,650 mg, Intravenous, 1 Day/Dose, 21 of 23 cycles Administration: 5,650 mg (01/09/2018), 5,650 mg (01/23/2018), 5,650 mg (02/11/2018), 5,650 mg (02/25/2018), 5,650 mg (03/12/2018), 5,650 mg (03/27/2018), 5,650 mg (04/10/2018), 5,650 mg (04/24/2018), 5,650 mg (05/08/2018), 5,650 mg (05/22/2018), 5,650 mg (06/10/2018), 5,650 mg (06/26/2018), 5,650 mg (07/10/2018), 5,650 mg (07/24/2018), 5,650 mg (08/13/2018), 5,650 mg (08/27/2018), 5,650 mg (09/10/2018), 5,650 mg (09/24/2018), 5,650 mg (10/08/2018), 5,650 mg (11/05/2018), 5,650 mg (11/19/2018) bevacizumab-bvzr  (ZIRABEV) 500 mg in sodium chloride 0.9 % 100 mL chemo infusion, 5 mg/kg = 500 mg (100 % of original dose 5 mg/kg), Intravenous,  Once, 1 of 3 cycles Dose modification: 5 mg/kg (original dose 5 mg/kg, Cycle 21) Administration: 500 mg (11/19/2018)  for chemotherapy treatment.    01/23/2018 Cancer Staging   Staging form: Colon and Rectum, AJCC 7th Edition - Pathologic: M1 - Signed by Zoila Shutter, MD on 01/23/2018   Malignant neoplasm of overlapping sites of colon ([place])  12/26/2017 Initial Diagnosis   Cancer of overlapping sites of colon metastatic to intra-abdominal lymph node ([place])   01/09/2018 -  Chemotherapy   The patient had palonosetron (ALOXI) injection 0.25 mg, 0.25 mg, Intravenous,  Once, 13 of 14 cycles Administration: 0.25 mg (01/09/2018), 0.25 mg (01/23/2018), 0.25 mg (02/11/2018), 0.25 mg (02/25/2018), 0.25 mg (03/12/2018), 0.25 mg (03/27/2018), 0.25 mg (04/10/2018), 0.25 mg (04/24/2018), 0.25 mg (05/08/2018), 0.25 mg (05/22/2018), 0.25 mg (06/10/2018), 0.25 mg (06/26/2018), 0.25 mg (07/10/2018) irinotecan (CAMPTOSAR) 420 mg in dextrose 5 % 500 mL chemo infusion, 180 mg/m2 = 420 mg, Intravenous,  Once, 9 of 9 cycles Administration: 420 mg (01/09/2018), 420 mg (01/23/2018), 420 mg (02/11/2018), 420 mg (02/25/2018), 420 mg (03/12/2018), 420 mg (03/27/2018), 420 mg (04/10/2018), 420 mg (04/24/2018), 420 mg (05/08/2018) leucovorin 900 mg in dextrose 5 % 250 mL infusion, 944 mg, Intravenous,  Once, 13 of 14 cycles Administration: 900 mg (01/09/2018), 900 mg (01/23/2018), 900 mg (02/11/2018), 900 mg (02/25/2018), 900 mg (03/12/2018), 900 mg (03/27/2018), 900 mg (04/10/2018), 900 mg (04/24/2018), 900 mg (05/08/2018), 900 mg (06/10/2018), 900 mg (06/26/2018), 900 mg (07/10/2018) fluorouracil (ADRUCIL) chemo injection 950  mg, 400 mg/m2 = 950 mg, Intravenous,  Once, 13 of 14 cycles Administration: 950 mg (01/09/2018), 950 mg (01/23/2018), 950 mg (02/11/2018), 950 mg (02/25/2018), 950 mg (03/12/2018), 950 mg (03/27/2018), 950 mg (04/10/2018), 950 mg  (04/24/2018), 950 mg (05/08/2018), 950 mg (05/22/2018), 950 mg (06/10/2018), 950 mg (06/26/2018), 950 mg (07/10/2018) fluorouracil (ADRUCIL) 5,650 mg in sodium chloride 0.9 % 137 mL chemo infusion, 2,400 mg/m2 = 5,650 mg, Intravenous, 1 Day/Dose, 13 of 14 cycles Administration: 5,650 mg (01/09/2018), 5,650 mg (01/23/2018), 5,650 mg (02/11/2018), 5,650 mg (02/25/2018), 5,650 mg (03/12/2018), 5,650 mg (03/27/2018), 5,650 mg (04/10/2018), 5,650 mg (04/24/2018), 5,650 mg (05/08/2018), 5,650 mg (05/22/2018), 5,650 mg (06/10/2018), 5,650 mg (06/26/2018), 5,650 mg (07/10/2018)  for chemotherapy treatment.    Colon cancer metastasized to multiple sites [facility])  01/16/2018 Initial Diagnosis   Colon cancer metastasized to multiple sites [facility])   01/23/2018 - 02/10/2018 Chemotherapy   The patient had panitumumab (VECTIBIX) 600 mg in sodium chloride 0.9 % 100 mL chemo infusion, 640 mg, Intravenous,  Once, 1 of 3 cycles Administration: 600 mg (01/23/2018)  for chemotherapy treatment.    03/12/2018 - 10/21/2018 Chemotherapy   The patient had bevacizumab (AVASTIN) 500 mg in sodium chloride 0.9 % 100 mL chemo infusion, 525 mg, Intravenous,  Once, 15 of 22 cycles Administration: 500 mg (03/12/2018), 500 mg (03/27/2018), 500 mg (04/10/2018), 500 mg (04/24/2018), 500 mg (06/10/2018), 500 mg (05/08/2018), 500 mg (05/22/2018), 500 mg (06/26/2018), 500 mg (07/10/2018), 500 mg (07/24/2018), 500 mg (08/13/2018), 500 mg (08/27/2018), 500 mg (09/10/2018), 500 mg (09/24/2018), 500 mg (10/08/2018)  for chemotherapy treatment.       CANCER STAGING: Cancer Staging Malignant neoplasm of transverse colon [facility]) Staging form: Colon and Rectum, AJCC 7th Edition - Pathologic stage from 03/04/2015: Stage IIIC (T4a, N2a, cM0) - Signed by Baird Cancer, PA-C on 03/04/2015 - Pathologic: M1 - Signed by Zoila Shutter, MD on 01/23/2018    INTERVAL HISTORY:  Mr. Brockmann 63 y.o. male seen for toxicity assessment and further treatment of metastatic colon cancer.  Appetite is 50%.  Energy  levels are 25%.  He does report pain in the right hand which is stable.  Numbness in the feet has also been stable.  Diarrhea is overall well controlled with Lomotil.  Denies any fevers or chills.  No nosebleeds, bleeding per rectum, hematuria reported.  No recent ER visits or hospitalizations.  REVIEW OF SYSTEMS:  Review of Systems  Gastrointestinal: Positive for diarrhea.  Neurological: Positive for numbness.  All other systems reviewed and are negative.    PAST MEDICAL/SURGICAL HISTORY:  Past Medical History:  Diagnosis Date   Abnormal stress echocardiogram    Adenocarcinoma of transverse colon ([place]) 02/10/2015   Alcohol abuse    Heavy Use up until 2010   Anxiety    Blood transfusion without reported diagnosis    Cancer of overlapping sites of colon metastatic to intra-abdominal lymph node ([place]) 12/26/2017   COPD (chronic obstructive pulmonary disease) ([facility])    Depression    Emphysema of lung ([facility])    GERD (gastroesophageal reflux disease)    Head trauma 2001   closed head injury; coma for 4 weeks   Hypercholesterolemia    Hypertension    ING HERN W/GANGREN RECUR UNILAT/UNSPEC ING HERN 04/21/2009   Qualifier: Diagnosis of  By: Verl Blalock, MD, [place] [facility]    PTSD (post-traumatic stress disorder)    Pulmonary nodule 06/14/2016   SAH (subarachnoid hemorrhage) ([place]) 08/31/2012   SDH (subdural hematoma) ([place]) 08/31/2012   Past  Surgical History:  Procedure Laterality Date   BIOPSY  01/25/2016   Procedure: BIOPSY;  Surgeon: Danie Binder, MD;  [facility];  Service: Endoscopy;;  ileum;    cardiac cath     COLONOSCOPY  2011   Dr. Oneida Alar: multiple adenomas and hyperplastic polyps   COLONOSCOPY WITH PROPOFOL N/A 01/12/2015   Procedure: COLONOSCOPY WITH PROPOFOL;  Surgeon: Danie Binder, MD;  [facility];  Service: Endoscopy;  Laterality: N/A;  1030   COLONOSCOPY WITH PROPOFOL N/A 01/25/2016   Procedure: COLONOSCOPY WITH PROPOFOL;  Surgeon:  Danie Binder, MD;  [facility];  Service: Endoscopy;  Laterality: N/A;  [place]   ESOPHAGOGASTRODUODENOSCOPY (EGD) WITH PROPOFOL N/A 01/12/2015   Procedure: ESOPHAGOGASTRODUODENOSCOPY (EGD) WITH PROPOFOL;  Surgeon: Danie Binder, MD;  [facility];  Service: Endoscopy;  Laterality: N/A;   head injury surgery     HERNIA REPAIR Right 2012   Inguinal- [facility]   KIDNEY SURGERY     >30 years ago   LAPAROSCOPIC RIGHT HEMI COLECTOMY Left 02/10/2015   Procedure: LAPAROSCOPIC THEN OPEN RIGHT HEMI COLECTOMY;  Surgeon: [name], MD;  Location: [facility];  Service: General;  Laterality: Left;   LAPAROTOMY N/A 12/19/2017   Procedure: EXPLORATORY LAPAROTOMY;  Surgeon: Aviva Signs, MD;  Location: [facility];  Service: General;  Laterality: N/A;   OPEN REDUCTION INTERNAL FIXATION (ORIF) DISTAL RADIAL FRACTURE Right 10/18/2018   Procedure: OPEN REDUCTION INTERNAL FIXATION (ORIF) RIGHT DISTAL RADIAL FRACTURE;  Surgeon: [name], MD;  Location: [place];  Service: Orthopedics;  Laterality: Right;  block in preop   POLYPECTOMY  01/25/2016   Procedure: POLYPECTOMY;  Surgeon: Danie Binder, MD;  [facility];  Service: Endoscopy;;  colon   PORT-A-CATH REMOVAL N/A 02/26/2017   Procedure: MINOR REMOVAL PORT-A-CATH;  Surgeon: Aviva Signs, MD;  Location: [facility];  Service: General;  Laterality: N/A;  Pt to arrive at [place] N/A 03/24/2015   Procedure: INSERTION PORT-A-CATH;  Surgeon: Jules Husbands, MD;  Location: [facility];  Service: General;  Laterality: N/A;   PORTACATH PLACEMENT Left 01/07/2018   Procedure: INSERTION PORT A CATH (ATTACHED CATHETER IN LEFT SUBCLAVIAN);  Surgeon: Aviva Signs, MD;  Location: [facility];  Service: General;  Laterality: Left;     SOCIAL HISTORY:  Social History   Socioeconomic History   Marital status: Divorced    Spouse name: Not on file   Number of children: 2   Years  of education: 12   Highest education level: Not on file  Occupational History   Occupation: disabled/unemployed   Occupation: unemployed/Mediicaid only    Comment: NO INCOME  Social Designer, fashion/clothing strain: Very hard   Food insecurity    Worry: Often true    Inability: Often true   Transportation needs    Medical: Yes    Non-medical: No  Tobacco Use   Smoking status: Former Smoker    Packs/day: 0.10    Years: 30.00    Pack years: 3.00    Types: Cigarettes    Start date: 02/06/1973    Quit date: 12/09/2017    Years since quitting: 0.9   Smokeless tobacco: Never Used   Tobacco comment: Quit on 12/09/2017  Substance and Sexual Activity   Alcohol use: Yes    Alcohol/week: 0.0 standard drinks    Comment: beer occ, history of ETOH abuse in remote past.  Drug use: No   Sexual activity: Never    Partners: Female    Birth control/protection: None  Lifestyle   Physical activity    Days per week: Not on file    Minutes per session: Not on file   Stress: Not on file  Relationships   Social connections    Talks on phone: Not on file    Gets together: Not on file    Attends religious service: Not on file    Active member of club or organization: Not on file    Attends meetings of clubs or organizations: Not on file    Relationship status: Not on file   Intimate partner violence    Fear of current or ex partner: Not on file    Emotionally abused: Not on file    Physically abused: Not on file    Forced sexual activity: Not on file  Other Topics Concern   Not on file  Social History Narrative   Single   Lives alone   Turned down for disability    FAMILY HISTORY:  Family History  Problem Relation Age of Onset   Pulmonary embolism Mother    Breast cancer Mother    Cancer Mother        breast cancer   Arthritis Mother    Heart disease Father    Cancer Father 40       Leukemia   Cancer Paternal Grandfather        Lung   Ataxia Neg Hx     Chorea Neg Hx    Dementia Neg Hx    Mental retardation Neg Hx    Migraines Neg Hx    Multiple sclerosis Neg Hx    Neurofibromatosis Neg Hx    Neuropathy Neg Hx    Parkinsonism Neg Hx    Seizures Neg Hx    Stroke Neg Hx    Colon cancer Neg Hx     CURRENT MEDICATIONS:  Outpatient Encounter Medications as of 11/19/2018  Medication Sig Note   amitriptyline (ELAVIL) 25 MG tablet Take 1 tablet (25 mg total) by mouth at bedtime.    atorvastatin (LIPITOR) 20 MG tablet TAKE 1 TABLET BY MOUTH AT BEDTIME    buPROPion (WELLBUTRIN SR) 150 MG 12 hr tablet Take 150 mg by mouth every morning.     busPIRone (BUSPAR) 10 MG tablet Take 10 mg by mouth 2 (two) times daily.    fluorouracil CALGB 29528 in sodium chloride 0.9 % 150 mL Inject 5,650 mg into the vein. Over 46 hours    FLUoxetine (PROZAC) 20 MG capsule TAKE 1 CAPSULE BY MOUTH 2 TIMES A DAY (Patient taking differently: Take 20 mg by mouth 2 (two) times daily. ) 06/10/2018: 1 tab TID   gabapentin (NEURONTIN) 300 MG capsule TAKE 1 CAPSULE BY MOUTH THREE TIMES A DAY    HYDROcodone-acetaminophen (NORCO/VICODIN) 5-325 MG tablet Take by mouth.    IRINOTECAN HCL IV Inject 420 mg into the vein every 14 (fourteen) days.    LEUCOVORIN CALCIUM IV Inject 944 mg into the vein every 14 (fourteen) days.    lidocaine-prilocaine (EMLA) cream APPLY A SMALL AMOUNT OVER PORT SITE AND COVER WITH PLASTIC WRAP ONE HOUR PRIOR TO APPOINTMENT    loperamide (IMODIUM) 2 MG capsule TAKE 2 CAPSULES BY MOUTH TWICE DAILY    omeprazole (PRILOSEC) 20 MG capsule Take 20 mg by mouth every morning.     ondansetron (ZOFRAN) 8 MG tablet Take 1 tablet (8 mg total) by  mouth 2 (two) times daily as needed for refractory nausea / vomiting. Start on day 3 after chemotherapy.    oxyCODONE-acetaminophen (PERCOCET/ROXICET) 5-325 MG tablet Take 1-2 tablets by mouth every 6 (six) hours as needed for severe pain.    PROAIR HFA 108 (90 Base) MCG/ACT inhaler INHALE 2  PUFFS BY MOUTH EVERY 6 HOURS AS NEEDED FOR SHORTNESS OF BREATH/WHEEZING.    prochlorperazine (COMPAZINE) 10 MG tablet Take 1 tablet (10 mg total) by mouth every 6 (six) hours as needed (NAUSEA).    rivaroxaban (XARELTO) 20 MG TABS tablet Take 1 tablet (20 mg total) by mouth daily with supper.    Facility-Administered Encounter Medications as of 11/19/2018  Medication   [COMPLETED] influenza vac split quadrivalent PF (FLUARIX) injection 0.5 mL   sodium chloride flush (NS) 0.9 % injection 10 mL    ALLERGIES:  No Known Allergies   PHYSICAL EXAM:  ECOG Performance status: 1  Vitals:   11/19/18 0844  BP: 131/66  Pulse: 88  Resp: 18  Temp: (!) 97.3 F (36.3 C)  SpO2: 95%   Filed Weights   11/19/18 0844  Weight: 227 lb 9.6 oz (103.2 kg)    Physical Exam Vitals signs reviewed.  Constitutional:      Appearance: Normal appearance.  Cardiovascular:     Rate and Rhythm: Normal rate and regular rhythm.     Heart sounds: Normal heart sounds.  Pulmonary:     Effort: Pulmonary effort is normal.     Breath sounds: Normal breath sounds.  Abdominal:     General: There is no distension.     Palpations: Abdomen is soft. There is no mass.  Musculoskeletal:        General: No swelling.  Skin:    General: Skin is warm.  Neurological:     General: No focal deficit present.     Mental Status: He is alert and oriented to person, place, and time.  Psychiatric:        Mood and Affect: Mood normal.        Behavior: Behavior normal.      LABORATORY DATA:  I have reviewed the labs as listed.  CBC    Component Value Date/Time   WBC 5.0 11/19/2018 0754   RBC 4.15 (L) 11/19/2018 0754   HGB 13.4 11/19/2018 0754   HCT 42.6 11/19/2018 0754   PLT 172 11/19/2018 0754   MCV 102.7 (H) 11/19/2018 0754   MCH 32.3 11/19/2018 0754   MCHC 31.5 11/19/2018 0754   RDW 15.4 11/19/2018 0754   LYMPHSABS 1.0 11/19/2018 0754   MONOABS 0.5 11/19/2018 0754   EOSABS 0.2 11/19/2018 0754    BASOSABS 0.0 11/19/2018 0754   CMP Latest Ref Rng & Units 11/19/2018 11/05/2018 10/10/2018  Glucose 70 - 99 mg/dL 105(H) 129(H) 99  BUN 6 - 20 mg/dL '12 10 9  ' Creatinine 0.61 - 1.24 mg/dL 0.71 0.81 0.88  Sodium 135 - 145 mmol/L 140 135 134(L)  Potassium 3.5 - 5.1 mmol/L 4.3 3.2(L) 4.0  Chloride 98 - 111 mmol/L 103 97(L) 98  CO2 22 - 32 mmol/L '26 26 26  ' Calcium 8.9 - 10.3 mg/dL 9.1 9.3 8.4(L)  Total Protein 6.5 - 8.1 g/dL 6.5 6.6 -  Total Bilirubin 0.3 - 1.2 mg/dL 0.5 0.8 -  Alkaline Phos 38 - 126 U/L 76 85 -  AST 15 - 41 U/L 16 17 -  ALT 0 - 44 U/L 14 13 -       DIAGNOSTIC IMAGING:  I have independently reviewed the scans and discussed with the patient.   I have reviewed Venita Lick LPN's note and agree with the documentation.  I personally performed a face-to-face visit, made revisions and my assessment and plan is as follows.    ASSESSMENT & PLAN:   Malignant neoplasm of transverse colon ([place]) 1.  Metastatic colon cancer, K-ras mutation positive, MSI stable: -Status post FOLFOX for 12 cycles and adjuvant setting from 03/30/2015 through 09/06/2015. - PET scan on 11/19/2017 showing peritoneal carcinomatosis, right paratracheal lymph node, nonspecific increased uptake in the right hilar region.  CEA was 12.9 on 11/16/2017. -9 cycles of FOLFIRI from 01/09/2018 through 05/08/2018 with addition of bevacizumab during cycle 5. - Maintenance 5-FU/leucovorin and bevacizumab started on 05/22/2018. - Last CEA on 11/05/2018 was 6.7. -CT CAP on 08/26/2018 showed stable peritoneal metastasis with peritoneal implants and mesenteric lesions.  No new progressive findings.  No hepatic metastatic disease.  Few scattered tiny lung nodules with no evidence of pulmonary metastasis. - He had a fracture of the right wrist with ORIF on 10/18/2018. - He tolerated last treatment very well.  He will proceed with his next treatment today.  We have reviewed his labs which are within normal limits. -We will see him  back in 2 weeks.  We will repeat his CT scans prior to next visit.  2.  Pulmonary embolism: -Incidental PE on CT chest on 05/20/2018.  CT angiogram confirmed bilateral PE. -He is on Xarelto and is tolerating it very well.  3.  Peripheral neuropathy: -Pre-existing neuropathy from prior oxaliplatin.  Numbness in the legs has been stable.  4.  Diarrhea: -Continue Lomotil 2 tablets twice daily.  Is well controlled.  Total time spent is 25 minutes with more than 50% of the time spent face-to-face discussing treatment plan and coordination of care.    Orders placed this encounter:  Orders Placed This Encounter  Procedures   CT Abdomen Pelvis W Contrast   CT Chest W Contrast      Derek Jack, MD [place] [phone]

## 2018-11-19 NOTE — Assessment & Plan Note (Addendum)
1.  Metastatic colon cancer, K-ras mutation positive, MS-stable: -Status post FOLFOX for 12 cycles in the adjuvant setting from 03/30/2015-09/06/2015. -PET scan on 11/19/2017 showing peritoneal carcinomatosis, right paratracheal lymph node, nonspecific increased uptake in the right hilar region.  CEA was 12.9 on 11/16/2017. -9 cycles of FOLFIRI from 01/09/2018 through 05/08/2018 with addition of bevacizumab during cycle 5. -Maintenance 5-FU/leucovorin and bevacizumab started on 05/22/2018. -CEA on 08/13/2018 increased to 10.4 from 5.1 on 06/26/2018. -CT CAP on 08/26/2018 showed stable peritoneal metastasis with peritoneal implants and mesenteric lesions.  No new progressive findings.  No hepatic metastatic disease.  Few scattered tiny lung nodules with no evidence of pulmonary metastasis. - He fell while getting onto the bus.  He had open reduction and internal fixation of the right wrist on 10/18/2018. - We reviewed his labs.  He will proceed with his next cycle today.  He will be seen back in 2 weeks for follow-up.   2.  Pulmonary embolism: -Incidental PE on CT chest on 05/20/2018.  CT angiogram confirmed bilateral PE. -He will continue Xarelto 20 mg daily without any bleeding problems.  3.  Peripheral neuropathy: -He has pre-existing neuropathy from prior oxaliplatin.  Numbness in the legs has been stable.  4.  Diarrhea: -This is well controlled with Lomotil 2 tablets twice daily.

## 2018-11-19 NOTE — Progress Notes (Signed)
[address], [place] [address]   CLINIC:  Medical Oncology/Hematology  PCP:  Patient, No Pcp Per No address on file None   REASON FOR VISIT:  Follow-up for metastatic colon cancer   BRIEF ONCOLOGIC HISTORY:  Oncology History Overview Note  Stage IIIC ([id number]) adenocarcinoma of transverse colon, diagnosed on colonoscopy by Dr. Oneida Alar on 01/12/2015 followed by definitive surgery by Dr. [name] with right hemicolectomy on 02/12/2015.  He then underwent FOLFOX x 12 cycles in the adjuvant setting (03/30/2015- 09/06/2015).   Malignant neoplasm of transverse colon ([place])  01/12/2015 Pathologic Stage   Colon, biopsy, distal transverse - TUBULOVILLOUS ADENOMA WITH HIGH GRADE DYSPLASIA.   01/12/2015 Procedure   Colonoscopy by Dr. Oneida Alar.   01/12/2015 Tumor Marker   CEA: 6.6 (H)    01/18/2015 Imaging   CT abd/pelvis- Apple-core lesion identified in the mid transverse colon without obstruction. No evidence for lymphadenopathy in the gastrohepatic ligament or omentum.  Stable 8 mm hypo attenuating lesion in the left liver, likely a cyst.   02/10/2015 Initial Diagnosis   Adenocarcinoma of transverse colon ([place])   02/12/2015 Definitive Surgery   [name], Extended right hemicolectomy    02/12/2015 Pathology Results   Mucinous adenocarcinoma with penetration of visceral peritoneum, 4/19 lymph nodes for metastatic disease, negative resection margins, with LVI and perineural invasion   03/30/2015 - 09/06/2015 Chemotherapy   FOLFOX x 12 cycles   05/25/2015 Treatment Plan Change   5 FU bolus discontinued for cycle #5   06/08/2015 Treatment Plan Change   Treatment deferred x 1 week   06/15/2015 Treatment Plan Change   5FU CI decreased by 10% and Oxaliplatin reduced by 15% for cycles #6-#11; Oxaliplatin dropped for cycle #12 d/t neuropathy.    10/20/2015 Imaging   CT CAP- Right hemicolectomy without evidence of metastatic disease. 2. Previously  measured ground-glass lesion in the left upper lobe has resolved. 3. Probable food debris in the stomach, simulating gastric wall thickening. Please correlate clinically. 4. 6 mm irregular nodular density in the left upper lobe, stable. Continued attention on followup exams is warranted.   01/25/2016 Procedure   Colonoscopy by Dr. Oneida Alar- Non-thrombosed external hemorrhoids found on digital rectal exam. - One 4 mm polyp in the rectum, removed with a cold biopsy forceps. Resected and retrieved. - Congested mucosa in the neo-terminal ileum. Biopsied. - Redundant colon. - Internal hemorrhoids.   01/26/2016 Pathology Results   1. Terminal ileum, biopsy - MILD ACUTE (ACTIVE) ILEITIS. - NO DYSPLASIA OR MALIGNANCY IDENTIFIED - SEE COMMENT. 2. Rectum, polyp(s) - HYPERPLASTIC POLYP (X 1). - NO DYSPLASIA OR MALIGNANCY IDENTIFIED.   04/13/2016 Imaging   CT chest- Stable CT chest. 6 mm irregular nodule anterior left upper lobe is stable. The scattered areas of peribronchovascular micro nodularity in the lungs bilaterally are unchanged.   10/19/2016 Imaging   CT CAP: 1. Status post right hemicolectomy. No findings to suggest metastatic disease in the abdomen or pelvis. 2. Aortic atherosclerosis. 3. Additional incidental findings, as above. Aortic Atherosclerosis (ICD10-I70.0).   01/09/2018 -  Chemotherapy   The patient had palonosetron (ALOXI) injection 0.25 mg, 0.25 mg, Intravenous,  Once, 21 of 23 cycles Administration: 0.25 mg (01/09/2018), 0.25 mg (01/23/2018), 0.25 mg (02/11/2018), 0.25 mg (02/25/2018), 0.25 mg (03/12/2018), 0.25 mg (03/27/2018), 0.25 mg (04/10/2018), 0.25 mg (04/24/2018), 0.25 mg (05/08/2018), 0.25 mg (05/22/2018), 0.25 mg (06/10/2018), 0.25 mg (06/26/2018), 0.25 mg (07/10/2018), 0.25 mg (07/24/2018), 0.25 mg (08/13/2018), 0.25 mg (08/27/2018), 0.25 mg (09/10/2018),  0.25 mg (09/24/2018), 0.25 mg (10/08/2018), 0.25 mg (11/05/2018), 0.25 mg (11/19/2018) irinotecan (CAMPTOSAR) 420 mg in dextrose 5 %  500 mL chemo infusion, 180 mg/m2 = 420 mg, Intravenous,  Once, 9 of 9 cycles Administration: 420 mg (01/09/2018), 420 mg (01/23/2018), 420 mg (02/11/2018), 420 mg (02/25/2018), 420 mg (03/12/2018), 420 mg (03/27/2018), 420 mg (04/10/2018), 420 mg (04/24/2018), 420 mg (05/08/2018) leucovorin 900 mg in dextrose 5 % 250 mL infusion, 944 mg, Intravenous,  Once, 21 of 23 cycles Administration: 900 mg (01/09/2018), 900 mg (01/23/2018), 900 mg (02/11/2018), 900 mg (02/25/2018), 900 mg (03/12/2018), 900 mg (03/27/2018), 900 mg (04/10/2018), 900 mg (04/24/2018), 900 mg (05/08/2018), 900 mg (06/10/2018), 900 mg (06/26/2018), 900 mg (07/10/2018), 900 mg (07/24/2018), 900 mg (08/13/2018), 900 mg (08/27/2018), 900 mg (09/10/2018), 900 mg (09/24/2018), 900 mg (10/08/2018), 900 mg (11/05/2018), 900 mg (11/19/2018) fluorouracil (ADRUCIL) chemo injection 950 mg, 400 mg/m2 = 950 mg, Intravenous,  Once, 21 of 23 cycles Administration: 950 mg (01/09/2018), 950 mg (01/23/2018), 950 mg (02/11/2018), 950 mg (02/25/2018), 950 mg (03/12/2018), 950 mg (03/27/2018), 950 mg (04/10/2018), 950 mg (04/24/2018), 950 mg (05/08/2018), 950 mg (05/22/2018), 950 mg (06/10/2018), 950 mg (06/26/2018), 950 mg (07/10/2018), 950 mg (07/24/2018), 950 mg (08/13/2018), 950 mg (08/27/2018), 950 mg (09/10/2018), 950 mg (09/24/2018), 950 mg (10/08/2018), 950 mg (11/05/2018), 950 mg (11/19/2018) fluorouracil (ADRUCIL) 5,650 mg in sodium chloride 0.9 % 137 mL chemo infusion, 2,400 mg/m2 = 5,650 mg, Intravenous, 1 Day/Dose, 21 of 23 cycles Administration: 5,650 mg (01/09/2018), 5,650 mg (01/23/2018), 5,650 mg (02/11/2018), 5,650 mg (02/25/2018), 5,650 mg (03/12/2018), 5,650 mg (03/27/2018), 5,650 mg (04/10/2018), 5,650 mg (04/24/2018), 5,650 mg (05/08/2018), 5,650 mg (05/22/2018), 5,650 mg (06/10/2018), 5,650 mg (06/26/2018), 5,650 mg (07/10/2018), 5,650 mg (07/24/2018), 5,650 mg (08/13/2018), 5,650 mg (08/27/2018), 5,650 mg (09/10/2018), 5,650 mg (09/24/2018), 5,650 mg (10/08/2018), 5,650 mg (11/05/2018), 5,650 mg (11/19/2018) bevacizumab-bvzr  (ZIRABEV) 500 mg in sodium chloride 0.9 % 100 mL chemo infusion, 5 mg/kg = 500 mg (100 % of original dose 5 mg/kg), Intravenous,  Once, 1 of 3 cycles Dose modification: 5 mg/kg (original dose 5 mg/kg, Cycle 21) Administration: 500 mg (11/19/2018)  for chemotherapy treatment.    01/23/2018 Cancer Staging   Staging form: Colon and Rectum, AJCC 7th Edition - Pathologic: M1 - Signed by Zoila Shutter, MD on 01/23/2018   Malignant neoplasm of overlapping sites of colon ([place])  12/26/2017 Initial Diagnosis   Cancer of overlapping sites of colon metastatic to intra-abdominal lymph node ([place])   01/09/2018 -  Chemotherapy   The patient had palonosetron (ALOXI) injection 0.25 mg, 0.25 mg, Intravenous,  Once, 13 of 14 cycles Administration: 0.25 mg (01/09/2018), 0.25 mg (01/23/2018), 0.25 mg (02/11/2018), 0.25 mg (02/25/2018), 0.25 mg (03/12/2018), 0.25 mg (03/27/2018), 0.25 mg (04/10/2018), 0.25 mg (04/24/2018), 0.25 mg (05/08/2018), 0.25 mg (05/22/2018), 0.25 mg (06/10/2018), 0.25 mg (06/26/2018), 0.25 mg (07/10/2018) irinotecan (CAMPTOSAR) 420 mg in dextrose 5 % 500 mL chemo infusion, 180 mg/m2 = 420 mg, Intravenous,  Once, 9 of 9 cycles Administration: 420 mg (01/09/2018), 420 mg (01/23/2018), 420 mg (02/11/2018), 420 mg (02/25/2018), 420 mg (03/12/2018), 420 mg (03/27/2018), 420 mg (04/10/2018), 420 mg (04/24/2018), 420 mg (05/08/2018) leucovorin 900 mg in dextrose 5 % 250 mL infusion, 944 mg, Intravenous,  Once, 13 of 14 cycles Administration: 900 mg (01/09/2018), 900 mg (01/23/2018), 900 mg (02/11/2018), 900 mg (02/25/2018), 900 mg (03/12/2018), 900 mg (03/27/2018), 900 mg (04/10/2018), 900 mg (04/24/2018), 900 mg (05/08/2018), 900 mg (06/10/2018), 900 mg (06/26/2018), 900 mg (07/10/2018) fluorouracil (ADRUCIL) chemo injection 950  mg, 400 mg/m2 = 950 mg, Intravenous,  Once, 13 of 14 cycles Administration: 950 mg (01/09/2018), 950 mg (01/23/2018), 950 mg (02/11/2018), 950 mg (02/25/2018), 950 mg (03/12/2018), 950 mg (03/27/2018), 950 mg (04/10/2018), 950 mg  (04/24/2018), 950 mg (05/08/2018), 950 mg (05/22/2018), 950 mg (06/10/2018), 950 mg (06/26/2018), 950 mg (07/10/2018) fluorouracil (ADRUCIL) 5,650 mg in sodium chloride 0.9 % 137 mL chemo infusion, 2,400 mg/m2 = 5,650 mg, Intravenous, 1 Day/Dose, 13 of 14 cycles Administration: 5,650 mg (01/09/2018), 5,650 mg (01/23/2018), 5,650 mg (02/11/2018), 5,650 mg (02/25/2018), 5,650 mg (03/12/2018), 5,650 mg (03/27/2018), 5,650 mg (04/10/2018), 5,650 mg (04/24/2018), 5,650 mg (05/08/2018), 5,650 mg (05/22/2018), 5,650 mg (06/10/2018), 5,650 mg (06/26/2018), 5,650 mg (07/10/2018)  for chemotherapy treatment.    Colon cancer metastasized to multiple sites [facility])  01/16/2018 Initial Diagnosis   Colon cancer metastasized to multiple sites [facility])   01/23/2018 - 02/10/2018 Chemotherapy   The patient had panitumumab (VECTIBIX) 600 mg in sodium chloride 0.9 % 100 mL chemo infusion, 640 mg, Intravenous,  Once, 1 of 3 cycles Administration: 600 mg (01/23/2018)  for chemotherapy treatment.    03/12/2018 - 10/21/2018 Chemotherapy   The patient had bevacizumab (AVASTIN) 500 mg in sodium chloride 0.9 % 100 mL chemo infusion, 525 mg, Intravenous,  Once, 15 of 22 cycles Administration: 500 mg (03/12/2018), 500 mg (03/27/2018), 500 mg (04/10/2018), 500 mg (04/24/2018), 500 mg (06/10/2018), 500 mg (05/08/2018), 500 mg (05/22/2018), 500 mg (06/26/2018), 500 mg (07/10/2018), 500 mg (07/24/2018), 500 mg (08/13/2018), 500 mg (08/27/2018), 500 mg (09/10/2018), 500 mg (09/24/2018), 500 mg (10/08/2018)  for chemotherapy treatment.       CANCER STAGING: Cancer Staging Malignant neoplasm of transverse colon [facility]) Staging form: Colon and Rectum, AJCC 7th Edition - Pathologic stage from 03/04/2015: Stage IIIC (T4a, N2a, cM0) - Signed by Baird Cancer, PA-C on 03/04/2015 - Pathologic: M1 - Signed by Zoila Shutter, MD on 01/23/2018    INTERVAL HISTORY:  Mr. Cuffe 62 y.o. male seen for toxicity assessment next cycle of chemotherapy.  He fell waiting for the bus on 10/18/2018 and  underwent open reduction and internal fixation for the right wrist fracture.  Chronic diarrhea from chemotherapy stable.  Is well controlled with Lomotil.  Numbness in the feet is also stable.  Appetite and energy levels are 25%.  Denies any fevers or infections.  He has some pain in the right wrist joint which is controlled with the pain medication.  REVIEW OF SYSTEMS:  Review of Systems  Gastrointestinal: Positive for diarrhea.  Neurological: Positive for numbness.  All other systems reviewed and are negative.    PAST MEDICAL/SURGICAL HISTORY:  Past Medical History:  Diagnosis Date  . Abnormal stress echocardiogram   . Adenocarcinoma of transverse colon ([place]) 02/10/2015  . Alcohol abuse    Heavy Use up until 2010  . Anxiety   . Blood transfusion without reported diagnosis   . Cancer of overlapping sites of colon metastatic to intra-abdominal lymph node ([place]) 12/26/2017  . COPD (chronic obstructive pulmonary disease) ([place])   . Depression   . Emphysema of lung ([place])   . GERD (gastroesophageal reflux disease)   . Head trauma 2001   closed head injury; coma for 4 weeks  . Hypercholesterolemia   . Hypertension   . ING HERN W/GANGREN RECUR UNILAT/UNSPEC ING HERN 04/21/2009   Qualifier: Diagnosis of  By: Verl Blalock, MD, Delanna Ahmadi PTSD (post-traumatic stress disorder)   . Pulmonary nodule 06/14/2016  . SAH (subarachnoid hemorrhage) ([place]) 08/31/2012  .  SDH (subdural hematoma) ([place]) 08/31/2012   Past Surgical History:  Procedure Laterality Date  . BIOPSY  01/25/2016   Procedure: BIOPSY;  Surgeon: Danie Binder, MD;  [facility];  Service: Endoscopy;;  ileum;   . cardiac cath    . COLONOSCOPY  2011   Dr. Oneida Alar: multiple adenomas and hyperplastic polyps  . COLONOSCOPY WITH PROPOFOL N/A 01/12/2015   Procedure: COLONOSCOPY WITH PROPOFOL;  Surgeon: Danie Binder, MD;  [facility];  Service: Endoscopy;  Laterality: N/A;  1030  . COLONOSCOPY WITH PROPOFOL N/A  01/25/2016   Procedure: COLONOSCOPY WITH PROPOFOL;  Surgeon: Danie Binder, MD;  [facility];  Service: Endoscopy;  Laterality: N/A;  1230  . CRANIOTOMY  2001  . ESOPHAGOGASTRODUODENOSCOPY (EGD) WITH PROPOFOL N/A 01/12/2015   Procedure: ESOPHAGOGASTRODUODENOSCOPY (EGD) WITH PROPOFOL;  Surgeon: Danie Binder, MD;  [facility];  Service: Endoscopy;  Laterality: N/A;  . head injury surgery    . HERNIA REPAIR Right 2012   Inguinal- [place]  . KIDNEY SURGERY     >30 years ago  . LAPAROSCOPIC RIGHT HEMI COLECTOMY Left 02/10/2015   Procedure: LAPAROSCOPIC THEN OPEN RIGHT HEMI COLECTOMY;  Surgeon: [name], MD;  Location: [facility] ORS;  Service: General;  Laterality: Left;  . LAPAROTOMY N/A 12/19/2017   Procedure: EXPLORATORY LAPAROTOMY;  Surgeon: Aviva Signs, MD;  Location: [facility];  Service: General;  Laterality: N/A;  . OPEN REDUCTION INTERNAL FIXATION (ORIF) DISTAL RADIAL FRACTURE Right 10/18/2018   Procedure: OPEN REDUCTION INTERNAL FIXATION (ORIF) RIGHT DISTAL RADIAL FRACTURE;  Surgeon: [name], MD;  Location: [facility];  Service: Orthopedics;  Laterality: Right;  block in preop  . POLYPECTOMY  01/25/2016   Procedure: POLYPECTOMY;  Surgeon: Danie Binder, MD;  [facility];  Service: Endoscopy;;  colon  . PORT-A-CATH REMOVAL N/A 02/26/2017   Procedure: MINOR REMOVAL PORT-A-CATH;  Surgeon: Aviva Signs, MD;  Location: [facility];  Service: General;  Laterality: N/A;  Pt to arrive at [place] N/A 03/24/2015   Procedure: INSERTION PORT-A-CATH;  Surgeon: Jules Husbands, MD;  Location: [facility];  Service: General;  Laterality: N/A;  . PORTACATH PLACEMENT Left 01/07/2018   Procedure: INSERTION PORT A CATH (ATTACHED CATHETER IN LEFT SUBCLAVIAN);  Surgeon: Aviva Signs, MD;  Location: [facility];  Service: General;  Laterality: Left;     SOCIAL HISTORY:  Social History   Socioeconomic History  . Marital status: Divorced     Spouse name: Not on file  . Number of children: 2  . Years of education: 40  . Highest education level: Not on file  Occupational History  . Occupation: disabled/unemployed  . Occupation: unemployed/Mediicaid only    Comment: NO INCOME  Social Needs  . Financial resource strain: Very hard  . Food insecurity    Worry: Often true    Inability: Often true  . Transportation needs    Medical: Yes    Non-medical: No  Tobacco Use  . Smoking status: Former Smoker    Packs/day: 0.10    Years: 30.00    Pack years: 3.00    Types: Cigarettes    Start date: 02/06/1973    Quit date: 12/09/2017    Years since quitting: 0.9  . Smokeless tobacco: Never Used  . Tobacco comment: Quit on 12/09/2017  Substance and Sexual Activity  . Alcohol use: Yes    Alcohol/week: 0.0 standard drinks    Comment: beer  occ, history of ETOH abuse in remote past.   . Drug use: No  . Sexual activity: Never    Partners: Female    Birth control/protection: None  Lifestyle  . Physical activity    Days per week: Not on file    Minutes per session: Not on file  . Stress: Not on file  Relationships  . Social Herbalist on phone: Not on file    Gets together: Not on file    Attends religious service: Not on file    Active member of club or organization: Not on file    Attends meetings of clubs or organizations: Not on file    Relationship status: Not on file  . Intimate partner violence    Fear of current or ex partner: Not on file    Emotionally abused: Not on file    Physically abused: Not on file    Forced sexual activity: Not on file  Other Topics Concern  . Not on file  Social History Narrative   Single   Lives alone   Turned down for disability    FAMILY HISTORY:  Family History  Problem Relation Age of Onset  . Pulmonary embolism Mother   . Breast cancer Mother   . Cancer Mother        breast cancer  . Arthritis Mother   . Heart disease Father   . Cancer Father 59       Leukemia   . Cancer Paternal Grandfather        Lung  . Ataxia Neg Hx   . Chorea Neg Hx   . Dementia Neg Hx   . Mental retardation Neg Hx   . Migraines Neg Hx   . Multiple sclerosis Neg Hx   . Neurofibromatosis Neg Hx   . Neuropathy Neg Hx   . Parkinsonism Neg Hx   . Seizures Neg Hx   . Stroke Neg Hx   . Colon cancer Neg Hx     CURRENT MEDICATIONS:  Outpatient Encounter Medications as of 11/05/2018  Medication Sig Note  . amitriptyline (ELAVIL) 25 MG tablet Take 1 tablet (25 mg total) by mouth at bedtime.   Marland Kitchen atorvastatin (LIPITOR) 20 MG tablet TAKE 1 TABLET BY MOUTH AT BEDTIME   . buPROPion (WELLBUTRIN SR) 150 MG 12 hr tablet Take 150 mg by mouth every morning.    . busPIRone (BUSPAR) 10 MG tablet Take 10 mg by mouth 2 (two) times daily.   . fluorouracil CALGB 54627 in sodium chloride 0.9 % 150 mL Inject 5,650 mg into the vein. Over 46 hours   . FLUoxetine (PROZAC) 20 MG capsule TAKE 1 CAPSULE BY MOUTH 2 TIMES A DAY (Patient taking differently: Take 20 mg by mouth 2 (two) times daily. ) 06/10/2018: 1 tab TID  . gabapentin (NEURONTIN) 300 MG capsule TAKE 1 CAPSULE BY MOUTH THREE TIMES A DAY   . IRINOTECAN HCL IV Inject 420 mg into the vein every 14 (fourteen) days.   Marland Kitchen LEUCOVORIN CALCIUM IV Inject 944 mg into the vein every 14 (fourteen) days.   Marland Kitchen lidocaine-prilocaine (EMLA) cream APPLY A SMALL AMOUNT OVER PORT SITE AND COVER WITH PLASTIC WRAP ONE HOUR PRIOR TO APPOINTMENT   . loperamide (IMODIUM) 2 MG capsule TAKE 2 CAPSULES BY MOUTH TWICE DAILY   . omeprazole (PRILOSEC) 20 MG capsule Take 20 mg by mouth every morning.    . ondansetron (ZOFRAN) 8 MG tablet Take 1 tablet (8 mg total) by  mouth 2 (two) times daily as needed for refractory nausea / vomiting. Start on day 3 after chemotherapy.   Marland Kitchen oxyCODONE-acetaminophen (PERCOCET/ROXICET) 5-325 MG tablet Take 1-2 tablets by mouth every 6 (six) hours as needed for severe pain.   Marland Kitchen PROAIR HFA 108 (90 Base) MCG/ACT inhaler INHALE 2 PUFFS BY MOUTH EVERY  6 HOURS AS NEEDED FOR SHORTNESS OF BREATH/WHEEZING.   Marland Kitchen prochlorperazine (COMPAZINE) 10 MG tablet Take 1 tablet (10 mg total) by mouth every 6 (six) hours as needed (NAUSEA).   . rivaroxaban (XARELTO) 20 MG TABS tablet Take 1 tablet (20 mg total) by mouth daily with supper.   . [DISCONTINUED] oxyCODONE-acetaminophen (PERCOCET) 5-325 MG tablet 1-2 tabs PO q6 hours prn pain    No facility-administered encounter medications on file as of 11/05/2018.     ALLERGIES:  No Known Allergies   PHYSICAL EXAM:  ECOG Performance status: 1  Vitals:   11/05/18 0820  BP: (!) 143/82  Pulse: 96  Resp: 18  Temp: 97.7 F (36.5 C)  SpO2: 97%   Filed Weights   11/05/18 0820  Weight: 216 lb 9.6 oz (98.2 kg)    Physical Exam Vitals signs reviewed.  Constitutional:      Appearance: Normal appearance.  Cardiovascular:     Rate and Rhythm: Normal rate and regular rhythm.     Heart sounds: Normal heart sounds.  Pulmonary:     Effort: Pulmonary effort is normal.     Breath sounds: Normal breath sounds.  Abdominal:     General: There is no distension.     Palpations: Abdomen is soft. There is no mass.  Musculoskeletal:        General: No swelling.  Skin:    General: Skin is warm.  Neurological:     General: No focal deficit present.     Mental Status: He is alert and oriented to person, place, and time.  Psychiatric:        Mood and Affect: Mood normal.        Behavior: Behavior normal.      LABORATORY DATA:  I have reviewed the labs as listed.  CBC    Component Value Date/Time   WBC 5.0 11/19/2018 0754   RBC 4.15 (L) 11/19/2018 0754   HGB 13.4 11/19/2018 0754   HCT 42.6 11/19/2018 0754   PLT 172 11/19/2018 0754   MCV 102.7 (H) 11/19/2018 0754   MCH 32.3 11/19/2018 0754   MCHC 31.5 11/19/2018 0754   RDW 15.4 11/19/2018 0754   LYMPHSABS 1.0 11/19/2018 0754   MONOABS 0.5 11/19/2018 0754   EOSABS 0.2 11/19/2018 0754   BASOSABS 0.0 11/19/2018 0754   CMP Latest Ref Rng & Units  11/19/2018 11/05/2018 10/10/2018  Glucose 70 - 99 mg/dL 105(H) 129(H) 99  BUN 6 - 20 mg/dL '12 10 9  ' Creatinine 0.61 - 1.24 mg/dL 0.71 0.81 0.88  Sodium 135 - 145 mmol/L 140 135 134(L)  Potassium 3.5 - 5.1 mmol/L 4.3 3.2(L) 4.0  Chloride 98 - 111 mmol/L 103 97(L) 98  CO2 22 - 32 mmol/L '26 26 26  ' Calcium 8.9 - 10.3 mg/dL 9.1 9.3 8.4(L)  Total Protein 6.5 - 8.1 g/dL 6.5 6.6 -  Total Bilirubin 0.3 - 1.2 mg/dL 0.5 0.8 -  Alkaline Phos 38 - 126 U/L 76 85 -  AST 15 - 41 U/L 16 17 -  ALT 0 - 44 U/L 14 13 -       DIAGNOSTIC IMAGING:  I have independently reviewed the  scans and discussed with the patient.   I have reviewed Venita Lick LPN's note and agree with the documentation.  I personally performed a face-to-face visit, made revisions and my assessment and plan is as follows.    ASSESSMENT & PLAN:   Malignant neoplasm of transverse colon ([place]) 1.  Metastatic colon cancer, K-ras mutation positive, MS-stable: -Status post FOLFOX for 12 cycles in the adjuvant setting from 03/30/2015-09/06/2015. -PET scan on 11/19/2017 showing peritoneal carcinomatosis, right paratracheal lymph node, nonspecific increased uptake in the right hilar region.  CEA was 12.9 on 11/16/2017. -9 cycles of FOLFIRI from 01/09/2018 through 05/08/2018 with addition of bevacizumab during cycle 5. -Maintenance 5-FU/leucovorin and bevacizumab started on 05/22/2018. -CEA on 08/13/2018 increased to 10.4 from 5.1 on 06/26/2018. -CT CAP on 08/26/2018 showed stable peritoneal metastasis with peritoneal implants and mesenteric lesions.  No new progressive findings.  No hepatic metastatic disease.  Few scattered tiny lung nodules with no evidence of pulmonary metastasis. - He fell while getting onto the bus.  He had open reduction and internal fixation of the right wrist on 10/18/2018. - We reviewed his labs.  He will proceed with his next cycle today.  He will be seen back in 2 weeks for follow-up.   2.  Pulmonary embolism:  -Incidental PE on CT chest on 05/20/2018.  CT angiogram confirmed bilateral PE. -He will continue Xarelto 20 mg daily without any bleeding problems.  3.  Peripheral neuropathy: -He has pre-existing neuropathy from prior oxaliplatin.  Numbness in the legs has been stable.  4.  Diarrhea: -This is well controlled with Lomotil 2 tablets twice daily.  Total time spent is 25 minutes with more than 50% of the time spent face-to-face discussing treatment plan and coordination of care.    Orders placed this encounter:  Orders Placed This Encounter  Procedures  . CBC with Differential/Platelet  . Comprehensive metabolic panel  . CEA      Derek Jack, MD [place] [phone]

## 2018-11-20 LAB — CEA: CEA: 7.5 ng/mL — ABNORMAL HIGH (ref 0.0–4.7)

## 2018-11-21 ENCOUNTER — Encounter (HOSPITAL_COMMUNITY): Payer: Self-pay

## 2018-11-21 ENCOUNTER — Other Ambulatory Visit: Payer: Self-pay

## 2018-11-21 ENCOUNTER — Inpatient Hospital Stay (HOSPITAL_COMMUNITY): Payer: Medicaid Other

## 2018-11-21 VITALS — BP 133/66 | HR 80 | Temp 97.8°F | Resp 16

## 2018-11-21 DIAGNOSIS — Z5111 Encounter for antineoplastic chemotherapy: Secondary | ICD-10-CM | POA: Diagnosis not present

## 2018-11-21 DIAGNOSIS — C184 Malignant neoplasm of transverse colon: Secondary | ICD-10-CM

## 2018-11-21 DIAGNOSIS — C188 Malignant neoplasm of overlapping sites of colon: Secondary | ICD-10-CM

## 2018-11-21 MED ORDER — SODIUM CHLORIDE 0.9% FLUSH
10.0000 mL | INTRAVENOUS | Status: DC | PRN
  Administered 2018-11-21: 10 mL
  Filled 2018-11-21: qty 10

## 2018-11-21 MED ORDER — HEPARIN SOD (PORK) LOCK FLUSH 100 UNIT/ML IV SOLN
500.0000 [IU] | Freq: Once | INTRAVENOUS | Status: AC | PRN
  Administered 2018-11-21: 500 [IU]

## 2018-11-21 NOTE — Progress Notes (Signed)
Erik [name] returns today for port de access and flush after 46 hr continous infusion of 5fu. Tolerated infusion without problems. Portacath located left chest wall was  deaccessed and flushed with 20ml NS and 500U/5ml Heparin and needle removed intact.  Procedure without incident. Patient tolerated procedure well.  Vitals stable and discharged home from clinic ambulatory. Follow up as scheduled.    

## 2018-12-03 ENCOUNTER — Other Ambulatory Visit: Payer: Self-pay

## 2018-12-03 ENCOUNTER — Ambulatory Visit (HOSPITAL_COMMUNITY)
Admission: RE | Admit: 2018-12-03 | Discharge: 2018-12-03 | Disposition: A | Discharge status: Home / Self Care | Payer: Medicaid Other | Source: Ambulatory Visit | Attending: Hematology | Admitting: Hematology

## 2018-12-03 DIAGNOSIS — C188 Malignant neoplasm of overlapping sites of colon: Secondary | ICD-10-CM | POA: Diagnosis not present

## 2018-12-03 DIAGNOSIS — C772 Secondary and unspecified malignant neoplasm of intra-abdominal lymph nodes: Secondary | ICD-10-CM | POA: Diagnosis present

## 2018-12-03 MED ORDER — IOHEXOL 300 MG/ML  SOLN
100.0000 mL | Freq: Once | INTRAMUSCULAR | Status: AC | PRN
  Administered 2018-12-03: 100 mL via INTRAVENOUS

## 2018-12-04 ENCOUNTER — Inpatient Hospital Stay (HOSPITAL_COMMUNITY): Payer: Medicaid Other

## 2018-12-04 ENCOUNTER — Inpatient Hospital Stay (HOSPITAL_BASED_OUTPATIENT_CLINIC_OR_DEPARTMENT_OTHER): Payer: Medicaid Other | Admitting: Hematology

## 2018-12-04 VITALS — BP 119/60 | HR 80 | Temp 96.9°F | Resp 18

## 2018-12-04 DIAGNOSIS — C184 Malignant neoplasm of transverse colon: Secondary | ICD-10-CM | POA: Diagnosis not present

## 2018-12-04 DIAGNOSIS — C188 Malignant neoplasm of overlapping sites of colon: Secondary | ICD-10-CM

## 2018-12-04 DIAGNOSIS — Z5111 Encounter for antineoplastic chemotherapy: Secondary | ICD-10-CM | POA: Diagnosis not present

## 2018-12-04 LAB — URINALYSIS, DIPSTICK ONLY
Bilirubin Urine: NEGATIVE
Glucose, UA: NEGATIVE mg/dL
Hgb urine dipstick: NEGATIVE
Ketones, ur: NEGATIVE mg/dL
Nitrite: NEGATIVE
Protein, ur: NEGATIVE mg/dL
Specific Gravity, Urine: 1.02 (ref 1.005–1.030)
pH: 5 (ref 5.0–8.0)

## 2018-12-04 LAB — COMPREHENSIVE METABOLIC PANEL
ALT: 21 U/L (ref 0–44)
AST: 19 U/L (ref 15–41)
Albumin: 3.6 g/dL (ref 3.5–5.0)
Alkaline Phosphatase: 78 U/L (ref 38–126)
Anion gap: 10 (ref 5–15)
BUN: 10 mg/dL (ref 8–23)
CO2: 29 mmol/L (ref 22–32)
Calcium: 9.3 mg/dL (ref 8.9–10.3)
Chloride: 102 mmol/L (ref 98–111)
Creatinine, Ser: 0.71 mg/dL (ref 0.61–1.24)
GFR calc Af Amer: >60 mL/min (ref >60)
GFR calc non Af Amer: >60 mL/min (ref >60)
Glucose, Bld: 102 mg/dL — ABNORMAL HIGH (ref 70–99)
Potassium: 4 mmol/L (ref 3.5–5.1)
Sodium: 141 mmol/L (ref 135–145)
Total Bilirubin: 0.5 mg/dL (ref 0.3–1.2)
Total Protein: 6.6 g/dL (ref 6.5–8.1)

## 2018-12-04 LAB — CBC WITH DIFFERENTIAL/PLATELET
Abs Immature Granulocytes: 0.02 10*3/uL (ref 0.00–0.07)
Basophils Absolute: 0 10*3/uL (ref 0.0–0.1)
Basophils Relative: 0 %
Eosinophils Absolute: 0.1 10*3/uL (ref 0.0–0.5)
Eosinophils Relative: 2 %
HCT: 41.5 % (ref 39.0–52.0)
Hemoglobin: 13.2 g/dL (ref 13.0–17.0)
Immature Granulocytes: 0 %
Lymphocytes Relative: 21 %
Lymphs Abs: 1.1 10*3/uL (ref 0.7–4.0)
MCH: 32.4 pg (ref 26.0–34.0)
MCHC: 31.8 g/dL (ref 30.0–36.0)
MCV: 102 fL — ABNORMAL HIGH (ref 80.0–100.0)
Monocytes Absolute: 0.7 10*3/uL (ref 0.1–1.0)
Monocytes Relative: 13 %
Neutro Abs: 3.2 10*3/uL (ref 1.7–7.7)
Neutrophils Relative %: 64 %
Platelets: 212 10*3/uL (ref 150–400)
RBC: 4.07 MIL/uL — ABNORMAL LOW (ref 4.22–5.81)
RDW: 14.6 % (ref 11.5–15.5)
WBC: 5.1 10*3/uL (ref 4.0–10.5)
nRBC: 0 % (ref 0.0–0.2)

## 2018-12-04 MED ORDER — SODIUM CHLORIDE 0.9 % IV SOLN
2400.0000 mg/m2 | INTRAVENOUS | Status: DC
  Administered 2018-12-04: 5650 mg via INTRAVENOUS
  Filled 2018-12-04: qty 113

## 2018-12-04 MED ORDER — PALONOSETRON HCL INJECTION 0.25 MG/5ML
0.2500 mg | Freq: Once | INTRAVENOUS | Status: AC
  Administered 2018-12-04: 0.25 mg via INTRAVENOUS
  Filled 2018-12-04: qty 5

## 2018-12-04 MED ORDER — SODIUM CHLORIDE 0.9 % IV SOLN
Freq: Once | INTRAVENOUS | Status: AC
  Administered 2018-12-04: 11:00:00 via INTRAVENOUS

## 2018-12-04 MED ORDER — SODIUM CHLORIDE 0.9 % IV SOLN
900.0000 mg | Freq: Once | INTRAVENOUS | Status: AC
  Administered 2018-12-04: 900 mg via INTRAVENOUS
  Filled 2018-12-04: qty 10

## 2018-12-04 MED ORDER — ATROPINE SULFATE 1 MG/ML IJ SOLN
0.5000 mg | Freq: Once | INTRAMUSCULAR | Status: AC
  Administered 2018-12-04: 0.5 mg via INTRAVENOUS
  Filled 2018-12-04: qty 1

## 2018-12-04 MED ORDER — FLUOROURACIL CHEMO INJECTION 2.5 GM/50ML
400.0000 mg/m2 | Freq: Once | INTRAVENOUS | Status: AC
  Administered 2018-12-04: 13:00:00 950 mg via INTRAVENOUS
  Filled 2018-12-04: qty 19

## 2018-12-04 MED ORDER — SODIUM CHLORIDE 0.9 % IV SOLN
5.0000 mg/kg | Freq: Once | INTRAVENOUS | Status: AC
  Administered 2018-12-04: 500 mg via INTRAVENOUS
  Filled 2018-12-04: qty 4

## 2018-12-04 MED ORDER — SODIUM CHLORIDE 0.9 % IV SOLN
10.0000 mg | Freq: Once | INTRAVENOUS | Status: AC
  Administered 2018-12-04: 10 mg via INTRAVENOUS
  Filled 2018-12-04: qty 10

## 2018-12-04 NOTE — Progress Notes (Signed)
Pt presents today for tx and f/u visit with Dr. Delton Coombes. MAR reviewed. VS within parameters for tx. Labs within parameters for tx.   Message received from [facility] LPN to proceed with tx. Labs within parameter for tx.   Treatment given today per MD orders. Tolerated infusion without adverse affects. Vital signs stable. No complaints at this time. Discharged from clinic ambulatory. F/U with [facility] as scheduled.

## 2018-12-04 NOTE — Patient Instructions (Signed)
[facility] Discharge Instructions for Patients Receiving Chemotherapy  Today you received the following chemotherapy agents   To help prevent nausea and vomiting after your treatment, we encourage you to take your nausea medication   If you develop nausea and vomiting that is not controlled by your nausea medication, call the clinic.   BELOW ARE SYMPTOMS THAT SHOULD BE REPORTED IMMEDIATELY:  *FEVER GREATER THAN 100.5 F  *CHILLS WITH OR WITHOUT FEVER  NAUSEA AND VOMITING THAT IS NOT CONTROLLED WITH YOUR NAUSEA MEDICATION  *UNUSUAL SHORTNESS OF BREATH  *UNUSUAL BRUISING OR BLEEDING  TENDERNESS IN MOUTH AND THROAT WITH OR WITHOUT PRESENCE OF ULCERS  *URINARY PROBLEMS  *BOWEL PROBLEMS  UNUSUAL RASH Items with * indicate a potential emergency and should be followed up as soon as possible.  Feel free to call the clinic should you have any questions or concerns. The clinic phone number is [phone].  Please show the CHEMO ALERT CARD at check-in to the Emergency Department and triage nurse.   

## 2018-12-05 ENCOUNTER — Encounter (HOSPITAL_COMMUNITY): Payer: Self-pay | Admitting: Hematology

## 2018-12-05 NOTE — Progress Notes (Signed)
[facility] [place], [place] [address]   CLINIC:  Medical Oncology/Hematology  PCP:  Alanson Puls, [facility] [place] [address] [phone]   REASON FOR VISIT:  Follow-up for metastatic colon cancer   BRIEF ONCOLOGIC HISTORY:  Oncology History Overview Note  Stage IIIC Erik [name]) adenocarcinoma of transverse colon, diagnosed on colonoscopy by Dr. Oneida Alar on 01/12/2015 followed by definitive surgery by Dr. [name] with right hemicolectomy on 02/12/2015.  He then underwent FOLFOX x 12 cycles in the adjuvant setting (03/30/2015- 09/06/2015).   Malignant neoplasm of transverse colon ([place])  01/12/2015 Pathologic Stage   Colon, biopsy, distal transverse - TUBULOVILLOUS ADENOMA WITH HIGH GRADE DYSPLASIA.   01/12/2015 Procedure   Colonoscopy by Dr. Oneida Alar.   01/12/2015 Tumor Marker   CEA: 6.6 (H)    01/18/2015 Imaging   CT abd/pelvis- Apple-core lesion identified in the mid transverse colon without obstruction. No evidence for lymphadenopathy in the gastrohepatic ligament or omentum.  Stable 8 mm hypo attenuating lesion in the left liver, likely a cyst.   02/10/2015 Initial Diagnosis   Adenocarcinoma of transverse colon ([place])   02/12/2015 Definitive Surgery   [name], Extended right hemicolectomy    02/12/2015 Pathology Results   Mucinous adenocarcinoma with penetration of visceral peritoneum, 4/19 lymph nodes for metastatic disease, negative resection margins, with LVI and perineural invasion   03/30/2015 - 09/06/2015 Chemotherapy   FOLFOX x 12 cycles   05/25/2015 Treatment Plan Change   5 FU bolus discontinued for cycle #5   06/08/2015 Treatment Plan Change   Treatment deferred x 1 week   06/15/2015 Treatment Plan Change   5FU CI decreased by 10% and Oxaliplatin reduced by 15% for cycles #6-#11; Oxaliplatin dropped for cycle #12 d/t neuropathy.    10/20/2015 Imaging   CT CAP- Right hemicolectomy without evidence of  metastatic disease. 2. Previously measured ground-glass lesion in the left upper lobe has resolved. 3. Probable food debris in the stomach, simulating gastric wall thickening. Please correlate clinically. 4. 6 mm irregular nodular density in the left upper lobe, stable. Continued attention on followup exams is warranted.   01/25/2016 Procedure   Colonoscopy by Dr. Oneida Alar- Non-thrombosed external hemorrhoids found on digital rectal exam. - One 4 mm polyp in the rectum, removed with a cold biopsy forceps. Resected and retrieved. - Congested mucosa in the neo-terminal ileum. Biopsied. - Redundant colon. - Internal hemorrhoids.   01/26/2016 Pathology Results   1. Terminal ileum, biopsy - MILD ACUTE (ACTIVE) ILEITIS. - NO DYSPLASIA OR MALIGNANCY IDENTIFIED - SEE COMMENT. 2. Rectum, polyp(s) - HYPERPLASTIC POLYP (X 1). - NO DYSPLASIA OR MALIGNANCY IDENTIFIED.   04/13/2016 Imaging   CT chest- Stable CT chest. 6 mm irregular nodule anterior left upper lobe is stable. The scattered areas of peribronchovascular micro nodularity in the lungs bilaterally are unchanged.   10/19/2016 Imaging   CT CAP: 1. Status post right hemicolectomy. No findings to suggest metastatic disease in the abdomen or pelvis. 2. Aortic atherosclerosis. 3. Additional incidental findings, as above. Aortic Atherosclerosis (ICD10-I70.0).   01/09/2018 -  Chemotherapy   The patient had palonosetron (ALOXI) injection 0.25 mg, 0.25 mg, Intravenous,  Once, 22 of 23 cycles Administration: 0.25 mg (01/09/2018), 0.25 mg (01/23/2018), 0.25 mg (02/11/2018), 0.25 mg (02/25/2018), 0.25 mg (03/12/2018), 0.25 mg (03/27/2018), 0.25 mg (04/10/2018), 0.25 mg (04/24/2018), 0.25 mg (05/08/2018), 0.25 mg (05/22/2018), 0.25 mg (06/10/2018), 0.25 mg (06/26/2018), 0.25 mg (07/10/2018), 0.25 mg (07/24/2018), 0.25 mg (08/13/2018), 0.25 mg (08/27/2018),  0.25 mg (09/10/2018), 0.25 mg (09/24/2018), 0.25 mg (10/08/2018), 0.25 mg (11/05/2018), 0.25 mg (11/19/2018), 0.25 mg  (12/04/2018) irinotecan (CAMPTOSAR) 420 mg in dextrose 5 % 500 mL chemo infusion, 180 mg/m2 = 420 mg, Intravenous,  Once, 9 of 9 cycles Administration: 420 mg (01/09/2018), 420 mg (01/23/2018), 420 mg (02/11/2018), 420 mg (02/25/2018), 420 mg (03/12/2018), 420 mg (03/27/2018), 420 mg (04/10/2018), 420 mg (04/24/2018), 420 mg (05/08/2018) leucovorin 900 mg in dextrose 5 % 250 mL infusion, 944 mg, Intravenous,  Once, 22 of 23 cycles Administration: 900 mg (01/09/2018), 900 mg (01/23/2018), 900 mg (02/11/2018), 900 mg (02/25/2018), 900 mg (03/12/2018), 900 mg (03/27/2018), 900 mg (04/10/2018), 900 mg (04/24/2018), 900 mg (05/08/2018), 900 mg (06/10/2018), 900 mg (06/26/2018), 900 mg (07/10/2018), 900 mg (07/24/2018), 900 mg (08/13/2018), 900 mg (08/27/2018), 900 mg (09/10/2018), 900 mg (09/24/2018), 900 mg (10/08/2018), 900 mg (11/05/2018), 900 mg (11/19/2018), 900 mg (12/04/2018) fluorouracil (ADRUCIL) chemo injection 950 mg, 400 mg/m2 = 950 mg, Intravenous,  Once, 22 of 23 cycles Administration: 950 mg (01/09/2018), 950 mg (01/23/2018), 950 mg (02/11/2018), 950 mg (02/25/2018), 950 mg (03/12/2018), 950 mg (03/27/2018), 950 mg (04/10/2018), 950 mg (04/24/2018), 950 mg (05/08/2018), 950 mg (05/22/2018), 950 mg (06/10/2018), 950 mg (06/26/2018), 950 mg (07/10/2018), 950 mg (07/24/2018), 950 mg (08/13/2018), 950 mg (08/27/2018), 950 mg (09/10/2018), 950 mg (09/24/2018), 950 mg (10/08/2018), 950 mg (11/05/2018), 950 mg (11/19/2018), 950 mg (12/04/2018) fluorouracil (ADRUCIL) 5,650 mg in sodium chloride 0.9 % 137 mL chemo infusion, 2,400 mg/m2 = 5,650 mg, Intravenous, 1 Day/Dose, 22 of 23 cycles Administration: 5,650 mg (01/09/2018), 5,650 mg (01/23/2018), 5,650 mg (02/11/2018), 5,650 mg (02/25/2018), 5,650 mg (03/12/2018), 5,650 mg (03/27/2018), 5,650 mg (04/10/2018), 5,650 mg (04/24/2018), 5,650 mg (05/08/2018), 5,650 mg (05/22/2018), 5,650 mg (06/10/2018), 5,650 mg (06/26/2018), 5,650 mg (07/10/2018), 5,650 mg (07/24/2018), 5,650 mg (08/13/2018), 5,650 mg (08/27/2018), 5,650 mg (09/10/2018), 5,650 mg  (09/24/2018), 5,650 mg (10/08/2018), 5,650 mg (11/05/2018), 5,650 mg (11/19/2018), 5,650 mg (12/04/2018) bevacizumab-bvzr (ZIRABEV) 500 mg in sodium chloride 0.9 % 100 mL chemo infusion, 5 mg/kg = 500 mg (100 % of original dose 5 mg/kg), Intravenous,  Once, 2 of 3 cycles Dose modification: 5 mg/kg (original dose 5 mg/kg, Cycle 21) Administration: 500 mg (11/19/2018), 500 mg (12/04/2018)  for chemotherapy treatment.    01/23/2018 Cancer Staging   Staging form: Colon and Rectum, AJCC 7th Edition - Pathologic: M1 - Signed by Zoila Shutter, MD on 01/23/2018   Malignant neoplasm of overlapping sites of colon ([place])  12/26/2017 Initial Diagnosis   Cancer of overlapping sites of colon metastatic to intra-abdominal lymph node ([place])   01/09/2018 -  Chemotherapy   The patient had palonosetron (ALOXI) injection 0.25 mg, 0.25 mg, Intravenous,  Once, 13 of 14 cycles Administration: 0.25 mg (01/09/2018), 0.25 mg (01/23/2018), 0.25 mg (02/11/2018), 0.25 mg (02/25/2018), 0.25 mg (03/12/2018), 0.25 mg (03/27/2018), 0.25 mg (04/10/2018), 0.25 mg (04/24/2018), 0.25 mg (05/08/2018), 0.25 mg (05/22/2018), 0.25 mg (06/10/2018), 0.25 mg (06/26/2018), 0.25 mg (07/10/2018) irinotecan (CAMPTOSAR) 420 mg in dextrose 5 % 500 mL chemo infusion, 180 mg/m2 = 420 mg, Intravenous,  Once, 9 of 9 cycles Administration: 420 mg (01/09/2018), 420 mg (01/23/2018), 420 mg (02/11/2018), 420 mg (02/25/2018), 420 mg (03/12/2018), 420 mg (03/27/2018), 420 mg (04/10/2018), 420 mg (04/24/2018), 420 mg (05/08/2018) leucovorin 900 mg in dextrose 5 % 250 mL infusion, 944 mg, Intravenous,  Once, 13 of 14 cycles Administration: 900 mg (01/09/2018), 900 mg (01/23/2018), 900 mg (02/11/2018), 900 mg (02/25/2018), 900 mg (03/12/2018), 900 mg (03/27/2018), 900 mg (04/10/2018), 900 mg (  04/24/2018), 900 mg (05/08/2018), 900 mg (06/10/2018), 900 mg (06/26/2018), 900 mg (07/10/2018) fluorouracil (ADRUCIL) chemo injection 950 mg, 400 mg/m2 = 950 mg, Intravenous,  Once, 13 of 14 cycles Administration: 950 mg  (01/09/2018), 950 mg (01/23/2018), 950 mg (02/11/2018), 950 mg (02/25/2018), 950 mg (03/12/2018), 950 mg (03/27/2018), 950 mg (04/10/2018), 950 mg (04/24/2018), 950 mg (05/08/2018), 950 mg (05/22/2018), 950 mg (06/10/2018), 950 mg (06/26/2018), 950 mg (07/10/2018) fluorouracil (ADRUCIL) 5,650 mg in sodium chloride 0.9 % 137 mL chemo infusion, 2,400 mg/m2 = 5,650 mg, Intravenous, 1 Day/Dose, 13 of 14 cycles Administration: 5,650 mg (01/09/2018), 5,650 mg (01/23/2018), 5,650 mg (02/11/2018), 5,650 mg (02/25/2018), 5,650 mg (03/12/2018), 5,650 mg (03/27/2018), 5,650 mg (04/10/2018), 5,650 mg (04/24/2018), 5,650 mg (05/08/2018), 5,650 mg (05/22/2018), 5,650 mg (06/10/2018), 5,650 mg (06/26/2018), 5,650 mg (07/10/2018)  for chemotherapy treatment.    Colon cancer metastasized to multiple sites [facility])  01/16/2018 Initial Diagnosis   Colon cancer metastasized to multiple sites [facility])   01/23/2018 - 02/10/2018 Chemotherapy   The patient had panitumumab (VECTIBIX) 600 mg in sodium chloride 0.9 % 100 mL chemo infusion, 640 mg, Intravenous,  Once, 1 of 3 cycles Administration: 600 mg (01/23/2018)  for chemotherapy treatment.    03/12/2018 - 10/21/2018 Chemotherapy   The patient had bevacizumab (AVASTIN) 500 mg in sodium chloride 0.9 % 100 mL chemo infusion, 525 mg, Intravenous,  Once, 15 of 22 cycles Administration: 500 mg (03/12/2018), 500 mg (03/27/2018), 500 mg (04/10/2018), 500 mg (04/24/2018), 500 mg (06/10/2018), 500 mg (05/08/2018), 500 mg (05/22/2018), 500 mg (06/26/2018), 500 mg (07/10/2018), 500 mg (07/24/2018), 500 mg (08/13/2018), 500 mg (08/27/2018), 500 mg (09/10/2018), 500 mg (09/24/2018), 500 mg (10/08/2018)  for chemotherapy treatment.       CANCER STAGING: Cancer Staging Malignant neoplasm of transverse colon [facility]) Staging form: Colon and Rectum, AJCC 7th Edition - Pathologic stage from 03/04/2015: Stage IIIC (T4a, N2a, cM0) - Signed by Baird Cancer, PA-C on 03/04/2015 - Pathologic: M1 - Signed by Zoila Shutter, MD on 01/23/2018    INTERVAL  HISTORY:  Erik [name] 63 y.o. male seen for follow-up of metastatic colon cancer.  Appetite and energy levels are 50%.  He reports some pain in the right hand region.  He had diarrhea for 2 to 3 days which is controlled with Lomotil.  Denied any nosebleeds, bleeding per rectum or hematuria.  Denies any new onset abdominal pains.  Numbness in the extremities has been stable.  Denies any fevers or night sweats.  No nausea vomiting or constipation reported.  REVIEW OF SYSTEMS:  Review of Systems  Gastrointestinal: Positive for diarrhea.  Neurological: Positive for numbness.  All other systems reviewed and are negative.    PAST MEDICAL/SURGICAL HISTORY:  Past Medical History:  Diagnosis Date   Abnormal stress echocardiogram    Adenocarcinoma of transverse colon ([place]) 02/10/2015   Alcohol abuse    Heavy Use up until 2010   Anxiety    Blood transfusion without reported diagnosis    Cancer of overlapping sites of colon metastatic to intra-abdominal lymph node ([place]) 12/26/2017   COPD (chronic obstructive pulmonary disease) ([facility])    Depression    Emphysema of lung ([facility])    GERD (gastroesophageal reflux disease)    Head trauma 2001   closed head injury; coma for 4 weeks   Hypercholesterolemia    Hypertension    ING HERN W/GANGREN RECUR UNILAT/UNSPEC ING HERN 04/21/2009   Qualifier: Diagnosis of  By: Verl Blalock, MD, Delanna Ahmadi    PTSD (post-traumatic stress  disorder)    Pulmonary nodule 06/14/2016   SAH (subarachnoid hemorrhage) ([place]) 08/31/2012   SDH (subdural hematoma) ([place]) 08/31/2012   Past Surgical History:  Procedure Laterality Date   BIOPSY  01/25/2016   Procedure: BIOPSY;  Surgeon: Danie Binder, MD;  [facility];  Service: Endoscopy;;  ileum;    cardiac cath     COLONOSCOPY  2011   Dr. Oneida Alar: multiple adenomas and hyperplastic polyps   COLONOSCOPY WITH PROPOFOL N/A 01/12/2015   Procedure: COLONOSCOPY WITH PROPOFOL;  Surgeon: Danie Binder, MD;   [facility];  Service: Endoscopy;  Laterality: N/A;  1030   COLONOSCOPY WITH PROPOFOL N/A 01/25/2016   Procedure: COLONOSCOPY WITH PROPOFOL;  Surgeon: Danie Binder, MD;  [facility];  Service: Endoscopy;  Laterality: N/A;  [place]   ESOPHAGOGASTRODUODENOSCOPY (EGD) WITH PROPOFOL N/A 01/12/2015   Procedure: ESOPHAGOGASTRODUODENOSCOPY (EGD) WITH PROPOFOL;  Surgeon: Danie Binder, MD;  [facility];  Service: Endoscopy;  Laterality: N/A;   head injury surgery     HERNIA REPAIR Right 2012   Inguinal- [facility]   KIDNEY SURGERY     >30 years ago   LAPAROSCOPIC RIGHT HEMI COLECTOMY Left 02/10/2015   Procedure: LAPAROSCOPIC THEN OPEN RIGHT HEMI COLECTOMY;  Surgeon: [name], MD;  Location: [facility];  Service: General;  Laterality: Left;   LAPAROTOMY N/A 12/19/2017   Procedure: EXPLORATORY LAPAROTOMY;  Surgeon: Aviva Signs, MD;  Location: [facility];  Service: General;  Laterality: N/A;   OPEN REDUCTION INTERNAL FIXATION (ORIF) DISTAL RADIAL FRACTURE Right 10/18/2018   Procedure: OPEN REDUCTION INTERNAL FIXATION (ORIF) RIGHT DISTAL RADIAL FRACTURE;  Surgeon: [name], MD;  Location: [facility];  Service: Orthopedics;  Laterality: Right;  block in preop   POLYPECTOMY  01/25/2016   Procedure: POLYPECTOMY;  Surgeon: Danie Binder, MD;  [facility];  Service: Endoscopy;;  colon   PORT-A-CATH REMOVAL N/A 02/26/2017   Procedure: MINOR REMOVAL PORT-A-CATH;  Surgeon: Aviva Signs, MD;  Location: [facility];  Service: General;  Laterality: N/A;  Pt to arrive at [place] N/A 03/24/2015   Procedure: INSERTION PORT-A-CATH;  Surgeon: Jules Husbands, MD;  Location: [facility];  Service: General;  Laterality: N/A;   PORTACATH PLACEMENT Left 01/07/2018   Procedure: INSERTION PORT A CATH (ATTACHED CATHETER IN LEFT SUBCLAVIAN);  Surgeon: Aviva Signs, MD;  Location: [facility];  Service: General;  Laterality:  Left;     SOCIAL HISTORY:  Social History   Socioeconomic History   Marital status: Divorced    Spouse name: Not on file   Number of children: 2   Years of education: 12   Highest education level: Not on file  Occupational History   Occupation: disabled/unemployed   Occupation: unemployed/Mediicaid only    Comment: NO INCOME  Social Designer, fashion/clothing strain: Very hard   Food insecurity    Worry: Often true    Inability: Often true   Transportation needs    Medical: Yes    Non-medical: No  Tobacco Use   Smoking status: Former Smoker    Packs/day: 0.10    Years: 30.00    Pack years: 3.00    Types: Cigarettes    Start date: 02/06/1973    Quit date: 12/09/2017    Years since quitting: 0.9   Smokeless tobacco: Never Used   Tobacco comment: Quit on 12/09/2017  Substance and Sexual Activity  Alcohol use: Yes    Alcohol/week: 0.0 standard drinks    Comment: beer occ, history of ETOH abuse in remote past.    Drug use: No   Sexual activity: Never    Partners: Female    Birth control/protection: None  Lifestyle   Physical activity    Days per week: Not on file    Minutes per session: Not on file   Stress: Not on file  Relationships   Social connections    Talks on phone: Not on file    Gets together: Not on file    Attends religious service: Not on file    Active member of club or organization: Not on file    Attends meetings of clubs or organizations: Not on file    Relationship status: Not on file   Intimate partner violence    Fear of current or ex partner: Not on file    Emotionally abused: Not on file    Physically abused: Not on file    Forced sexual activity: Not on file  Other Topics Concern   Not on file  Social History Narrative   Single   Lives alone   Turned down for disability    FAMILY HISTORY:  Family History  Problem Relation Age of Onset   Pulmonary embolism Mother    Breast cancer Mother    Cancer Mother          breast cancer   Arthritis Mother    Heart disease Father    Cancer Father 39       Leukemia   Cancer Paternal Grandfather        Lung   Ataxia Neg Hx    Chorea Neg Hx    Dementia Neg Hx    Mental retardation Neg Hx    Migraines Neg Hx    Multiple sclerosis Neg Hx    Neurofibromatosis Neg Hx    Neuropathy Neg Hx    Parkinsonism Neg Hx    Seizures Neg Hx    Stroke Neg Hx    Colon cancer Neg Hx     CURRENT MEDICATIONS:  Outpatient Encounter Medications as of 12/04/2018  Medication Sig Note   amitriptyline (ELAVIL) 25 MG tablet Take 1 tablet (25 mg total) by mouth at bedtime.    atorvastatin (LIPITOR) 20 MG tablet TAKE 1 TABLET BY MOUTH AT BEDTIME (Patient taking differently: Take 20 mg by mouth daily. )    buPROPion (WELLBUTRIN SR) 150 MG 12 hr tablet Take 150 mg by mouth every morning.     busPIRone (BUSPAR) 10 MG tablet Take 10 mg by mouth 2 (two) times daily.    fluorouracil CALGB 17616 in sodium chloride 0.9 % 150 mL Inject 5,650 mg into the vein. Over 46 hours    FLUoxetine (PROZAC) 20 MG capsule TAKE 1 CAPSULE BY MOUTH 2 TIMES A DAY (Patient taking differently: Take 20 mg by mouth 2 (two) times daily. ) 06/10/2018: 1 tab TID   gabapentin (NEURONTIN) 300 MG capsule TAKE 1 CAPSULE BY MOUTH THREE TIMES A DAY    IRINOTECAN HCL IV Inject 420 mg into the vein every 14 (fourteen) days.    LEUCOVORIN CALCIUM IV Inject 944 mg into the vein every 14 (fourteen) days.    lidocaine-prilocaine (EMLA) cream APPLY A SMALL AMOUNT OVER PORT SITE AND COVER WITH PLASTIC WRAP ONE HOUR PRIOR TO APPOINTMENT    loperamide (IMODIUM) 2 MG capsule TAKE 2 CAPSULES BY MOUTH TWICE DAILY    omeprazole (PRILOSEC)  20 MG capsule Take 20 mg by mouth every morning.     rivaroxaban (XARELTO) 20 MG TABS tablet Take 1 tablet (20 mg total) by mouth daily with supper.    WELLBUTRIN XL 150 MG 24 hr tablet Take 150 mg by mouth every morning.    ondansetron (ZOFRAN) 8 MG tablet Take  1 tablet (8 mg total) by mouth 2 (two) times daily as needed for refractory nausea / vomiting. Start on day 3 after chemotherapy. (Patient not taking: Reported on 12/04/2018)    oxyCODONE-acetaminophen (PERCOCET/ROXICET) 5-325 MG tablet Take 1-2 tablets by mouth every 6 (six) hours as needed for severe pain. (Patient not taking: Reported on 12/04/2018)    PROAIR HFA 108 (90 Base) MCG/ACT inhaler INHALE 2 PUFFS BY MOUTH EVERY 6 HOURS AS NEEDED FOR SHORTNESS OF BREATH/WHEEZING. (Patient not taking: Reported on 12/04/2018)    prochlorperazine (COMPAZINE) 10 MG tablet Take 1 tablet (10 mg total) by mouth every 6 (six) hours as needed (NAUSEA). (Patient not taking: Reported on 12/04/2018)    [DISCONTINUED] HYDROcodone-acetaminophen (NORCO/VICODIN) 5-325 MG tablet Take by mouth.    Facility-Administered Encounter Medications as of 12/04/2018  Medication   sodium chloride flush (NS) 0.9 % injection 10 mL    ALLERGIES:  No Known Allergies   PHYSICAL EXAM:  ECOG Performance status: 1  Vitals:   12/04/18 0901 12/04/18 0903  BP: (!) 110/57 (!) 110/57  Pulse: 81 81  Resp: 18 18  Temp: 98.2 F (36.8 C) 98.2 F (36.8 C)  SpO2: 97% 97%   Filed Weights   12/04/18 0901 12/04/18 0903  Weight: 226 lb 6.4 oz (102.7 kg) 226 lb 6.4 oz (102.7 kg)    Physical Exam Vitals signs reviewed.  Constitutional:      Appearance: Normal appearance.  Cardiovascular:     Rate and Rhythm: Normal rate and regular rhythm.     Heart sounds: Normal heart sounds.  Pulmonary:     Effort: Pulmonary effort is normal.     Breath sounds: Normal breath sounds.  Abdominal:     General: There is no distension.     Palpations: Abdomen is soft. There is no mass.  Musculoskeletal:        General: No swelling.  Skin:    General: Skin is warm.  Neurological:     General: No focal deficit present.     Mental Status: He is alert and oriented to person, place, and time.  Psychiatric:        Mood and Affect: Mood  normal.        Behavior: Behavior normal.      LABORATORY DATA:  I have reviewed the labs as listed.  CBC    Component Value Date/Time   WBC 5.1 12/04/2018 0923   RBC 4.07 (L) 12/04/2018 0923   HGB 13.2 12/04/2018 0923   HCT 41.5 12/04/2018 0923   PLT 212 12/04/2018 0923   MCV 102.0 (H) 12/04/2018 0923   MCH 32.4 12/04/2018 0923   MCHC 31.8 12/04/2018 0923   RDW 14.6 12/04/2018 0923   LYMPHSABS 1.1 12/04/2018 0923   MONOABS 0.7 12/04/2018 0923   EOSABS 0.1 12/04/2018 0923   BASOSABS 0.0 12/04/2018 0923   CMP Latest Ref Rng & Units 12/04/2018 11/19/2018 11/05/2018  Glucose 70 - 99 mg/dL 102(H) 105(H) 129(H)  BUN 8 - 23 mg/dL _0 Creatinine 0.61 - 1.24 mg/dL 0.71 0.71 0.81  Sodium 135 - 145 mmol/L 141 140 135  Potassium 3.5 - 5.1 mmol/L 4.0  4.3 3.2(L)  Chloride 98 - 111 mmol/L 102 103 97(L)  CO2 22 - 32 mmol/L _0 Calcium 8.9 - 10.3 mg/dL 9.3 9.1 9.3  Total Protein 6.5 - 8.1 g/dL 6.6 6.5 6.6  Total Bilirubin 0.3 - 1.2 mg/dL 0.5 0.5 0.8  Alkaline Phos 38 - 126 U/L 78 76 85  AST 15 - 41 U/L _1 ALT 0 - 44 U/L _2 DIAGNOSTIC IMAGING:  I have independently reviewed the scans and discussed with the patient.   I have reviewed Venita Lick LPN's note and agree with the documentation.  I personally performed a face-to-face visit, made revisions and my assessment and plan is as follows.    ASSESSMENT & PLAN:   Malignant neoplasm of transverse colon ([place]) 1.  Metastatic colon cancer, K-ras mutation positive, MSI stable: -Status post FOLFOX for 12 cycles and adjuvant setting from 03/30/2015 through 09/06/2015. - PET scan on 11/19/2017 showing peritoneal carcinomatosis, right paratracheal lymph node, nonspecific increased uptake in the right hilar region.  CEA was 12.9 on 11/16/2017. -9 cycles of FOLFIRI from 01/09/2018 through 05/08/2018 with addition of bevacizumab during cycle 5. - Maintenance 5-FU/leucovorin and bevacizumab started on  05/22/2018. - He had a fracture of the right wrist with ORIF on 10/18/2018. -We discussed CT CAP dated 12/03/2018 which showed slight interval increase in the nodular metastatic soft tissue along the anterior midline ventral peritoneum, largest nodular deposit component measuring 3.6 x 1.7 cm.  Also interval enlargement of metastatic nodule/lymph nodes in the central anterior abdomen, largest nodule measuring 1 cm, previously 0.5 cm.  Other mesenteric and peritoneal nodules are not significantly changed. -Because of equivocal findings, I have recommended continuation of maintenance therapy at this time.  We will plan to repeat scans in 2 months. -We reviewed his blood work.  He will proceed with his next treatment today.  2.  Pulmonary embolism: -Incidental PE on CT chest on 05/20/2018.  CT angiogram confirmed bilateral PE. -He is on Xarelto and is tolerating it very well.  3.  Peripheral neuropathy: -Pre-existing neuropathy from prior oxaliplatin.  Numbness in the legs has been stable.  4.  Diarrhea: -Continue Lomotil 2 tablets twice daily.  Is well controlled.  Total time spent is 25 minutes with more than 50% of the time spent face-to-face discussing treatment plan and coordination of care.    Orders placed this encounter:  No orders of the defined types were placed in this encounter.     Derek Jack, MD [place] [phone]

## 2018-12-05 NOTE — Assessment & Plan Note (Signed)
1.  Metastatic colon cancer, K-ras mutation positive, MSI stable: -Status post FOLFOX for 12 cycles and adjuvant setting from 03/30/2015 through 09/06/2015. - PET scan on 11/19/2017 showing peritoneal carcinomatosis, right paratracheal lymph node, nonspecific increased uptake in the right hilar region.  CEA was 12.9 on 11/16/2017. -9 cycles of FOLFIRI from 01/09/2018 through 05/08/2018 with addition of bevacizumab during cycle 5. - Maintenance 5-FU/leucovorin and bevacizumab started on 05/22/2018. - He had a fracture of the right wrist with ORIF on 10/18/2018. -We discussed CT CAP dated 12/03/2018 which showed slight interval increase in the nodular metastatic soft tissue along the anterior midline ventral peritoneum, largest nodular deposit component measuring 3.6 x 1.7 cm.  Also interval enlargement of metastatic nodule/lymph nodes in the central anterior abdomen, largest nodule measuring 1 cm, previously 0.5 cm.  Other mesenteric and peritoneal nodules are not significantly changed. -Because of equivocal findings, I have recommended continuation of maintenance therapy at this time.  We will plan to repeat scans in 2 months. -We reviewed his blood work.  He will proceed with his next treatment today.  2.  Pulmonary embolism: -Incidental PE on CT chest on 05/20/2018.  CT angiogram confirmed bilateral PE. -He is on Xarelto and is tolerating it very well.  3.  Peripheral neuropathy: -Pre-existing neuropathy from prior oxaliplatin.  Numbness in the legs has been stable.  4.  Diarrhea: -Continue Lomotil 2 tablets twice daily.  Is well controlled.

## 2018-12-06 ENCOUNTER — Other Ambulatory Visit: Payer: Self-pay

## 2018-12-06 ENCOUNTER — Inpatient Hospital Stay (HOSPITAL_COMMUNITY): Payer: Medicaid Other

## 2018-12-06 VITALS — BP 147/84 | HR 81 | Temp 98.0°F | Resp 18

## 2018-12-06 DIAGNOSIS — E538 Deficiency of other specified B group vitamins: Secondary | ICD-10-CM

## 2018-12-06 DIAGNOSIS — C184 Malignant neoplasm of transverse colon: Secondary | ICD-10-CM

## 2018-12-06 DIAGNOSIS — C188 Malignant neoplasm of overlapping sites of colon: Secondary | ICD-10-CM

## 2018-12-06 DIAGNOSIS — Z5111 Encounter for antineoplastic chemotherapy: Secondary | ICD-10-CM | POA: Diagnosis not present

## 2018-12-06 MED ORDER — CYANOCOBALAMIN 1000 MCG/ML IJ SOLN
INTRAMUSCULAR | Status: AC
  Filled 2018-12-06: qty 1

## 2018-12-06 MED ORDER — HEPARIN SOD (PORK) LOCK FLUSH 100 UNIT/ML IV SOLN
500.0000 [IU] | Freq: Once | INTRAVENOUS | Status: AC | PRN
  Administered 2018-12-06: 11:00:00 500 [IU]

## 2018-12-06 MED ORDER — CYANOCOBALAMIN 1000 MCG/ML IJ SOLN
1000.0000 ug | Freq: Once | INTRAMUSCULAR | Status: AC
  Administered 2018-12-06: 1000 ug via INTRAMUSCULAR

## 2018-12-06 MED ORDER — SODIUM CHLORIDE 0.9% FLUSH
10.0000 mL | INTRAVENOUS | Status: DC | PRN
  Administered 2018-12-06: 10 mL
  Filled 2018-12-06: qty 10

## 2018-12-06 NOTE — Progress Notes (Signed)
Erik [name] presents today for injection per MD orders. B12  administered IM in right Deltoid. Administration without incident. Patient tolerated well.  Erik [name] presented for pump d/c and flush. Portacath located in the left chest wall. Clean, Dry and Intact Good blood return present. Portacath flushed with 64ml NS and 500U/20ml Heparin per protocol and needle removed intact. Procedure without incident. Patient tolerated procedure well.   No complaints at this time. Discharged from clinic ambulatory. F/U with [facility] as scheduled.

## 2018-12-06 NOTE — Patient Instructions (Signed)
[facility]  Discharge Instructions:   _______________________________________________________________  Thank you for choosing [facility] at [facility] to provide your oncology and hematology care.  To afford each patient quality time with our providers, please arrive at least 15 minutes before your scheduled appointment.  You need to re-schedule your appointment if you arrive 10 or more minutes late.  We strive to give you quality time with our providers, and arriving late affects you and other patients whose appointments are after yours.  Also, if you no show three or more times for appointments you may be dismissed from the clinic.  Again, thank you for choosing  [facility]. Our hope is that these requests will allow you access to exceptional care and in a timely manner. _______________________________________________________________  If you have questions after your visit, please contact our office at [phone] between the hours of 8:30 a.m. and 5:00 p.m. Voicemails left after 4:30 p.m. will not be returned until the following business day. _______________________________________________________________  For prescription refill requests, have your pharmacy contact our office. _______________________________________________________________  Recommendations made by the consultant and any test results will be sent to your referring physician. _______________________________________________________________ 

## 2018-12-09 ENCOUNTER — Other Ambulatory Visit: Payer: Self-pay | Admitting: Orthopedic Surgery

## 2018-12-09 ENCOUNTER — Other Ambulatory Visit (HOSPITAL_COMMUNITY): Payer: Self-pay | Admitting: Orthopedic Surgery

## 2018-12-09 DIAGNOSIS — S52571D Other intraarticular fracture of lower end of right radius, subsequent encounter for closed fracture with routine healing: Secondary | ICD-10-CM

## 2018-12-17 ENCOUNTER — Encounter: Payer: Self-pay | Admitting: Gastroenterology

## 2018-12-18 ENCOUNTER — Inpatient Hospital Stay (HOSPITAL_COMMUNITY): Payer: Medicaid Other | Attending: Hematology

## 2018-12-18 ENCOUNTER — Inpatient Hospital Stay (HOSPITAL_COMMUNITY): Payer: Medicaid Other

## 2018-12-18 ENCOUNTER — Encounter (HOSPITAL_COMMUNITY): Payer: Self-pay

## 2018-12-18 ENCOUNTER — Inpatient Hospital Stay (HOSPITAL_BASED_OUTPATIENT_CLINIC_OR_DEPARTMENT_OTHER): Payer: Medicaid Other | Admitting: Hematology

## 2018-12-18 ENCOUNTER — Emergency Department (HOSPITAL_COMMUNITY): Payer: Medicaid Other

## 2018-12-18 ENCOUNTER — Emergency Department (HOSPITAL_COMMUNITY)
Admission: EM | Admit: 2018-12-18 | Discharge: 2018-12-19 | Disposition: A | Discharge status: Home / Self Care | Payer: Medicaid Other | Source: Home / Self Care | Attending: Emergency Medicine | Admitting: Emergency Medicine

## 2018-12-18 ENCOUNTER — Encounter (HOSPITAL_COMMUNITY): Payer: Self-pay | Admitting: Hematology

## 2018-12-18 ENCOUNTER — Other Ambulatory Visit: Payer: Self-pay

## 2018-12-18 VITALS — BP 161/72 | HR 97 | Temp 97.3°F | Resp 18

## 2018-12-18 DIAGNOSIS — F329 Major depressive disorder, single episode, unspecified: Secondary | ICD-10-CM | POA: Diagnosis not present

## 2018-12-18 DIAGNOSIS — F4310 Post-traumatic stress disorder, unspecified: Secondary | ICD-10-CM | POA: Diagnosis not present

## 2018-12-18 DIAGNOSIS — Y92000 Kitchen of unspecified non-institutional (private) residence as  the place of occurrence of the external cause: Secondary | ICD-10-CM | POA: Insufficient documentation

## 2018-12-18 DIAGNOSIS — S52291B Other fracture of shaft of right ulna, initial encounter for open fracture type I or II: Secondary | ICD-10-CM | POA: Insufficient documentation

## 2018-12-18 DIAGNOSIS — Y9301 Activity, walking, marching and hiking: Secondary | ICD-10-CM | POA: Insufficient documentation

## 2018-12-18 DIAGNOSIS — S52201A Unspecified fracture of shaft of right ulna, initial encounter for closed fracture: Secondary | ICD-10-CM | POA: Diagnosis not present

## 2018-12-18 DIAGNOSIS — C772 Secondary and unspecified malignant neoplasm of intra-abdominal lymph nodes: Secondary | ICD-10-CM | POA: Insufficient documentation

## 2018-12-18 DIAGNOSIS — Z5112 Encounter for antineoplastic immunotherapy: Secondary | ICD-10-CM | POA: Insufficient documentation

## 2018-12-18 DIAGNOSIS — C184 Malignant neoplasm of transverse colon: Secondary | ICD-10-CM | POA: Insufficient documentation

## 2018-12-18 DIAGNOSIS — S52391B Other fracture of shaft of radius, right arm, initial encounter for open fracture type I or II: Secondary | ICD-10-CM | POA: Insufficient documentation

## 2018-12-18 DIAGNOSIS — Z85038 Personal history of other malignant neoplasm of large intestine: Secondary | ICD-10-CM | POA: Diagnosis not present

## 2018-12-18 DIAGNOSIS — S62101B Fracture of unspecified carpal bone, right wrist, initial encounter for open fracture: Secondary | ICD-10-CM

## 2018-12-18 DIAGNOSIS — C7989 Secondary malignant neoplasm of other specified sites: Secondary | ICD-10-CM | POA: Insufficient documentation

## 2018-12-18 DIAGNOSIS — C786 Secondary malignant neoplasm of retroperitoneum and peritoneum: Secondary | ICD-10-CM | POA: Insufficient documentation

## 2018-12-18 DIAGNOSIS — E7800 Pure hypercholesterolemia, unspecified: Secondary | ICD-10-CM | POA: Diagnosis not present

## 2018-12-18 DIAGNOSIS — C188 Malignant neoplasm of overlapping sites of colon: Secondary | ICD-10-CM

## 2018-12-18 DIAGNOSIS — K219 Gastro-esophageal reflux disease without esophagitis: Secondary | ICD-10-CM | POA: Diagnosis not present

## 2018-12-18 DIAGNOSIS — Z5111 Encounter for antineoplastic chemotherapy: Secondary | ICD-10-CM | POA: Insufficient documentation

## 2018-12-18 DIAGNOSIS — Z87891 Personal history of nicotine dependence: Secondary | ICD-10-CM | POA: Diagnosis not present

## 2018-12-18 DIAGNOSIS — Z20828 Contact with and (suspected) exposure to other viral communicable diseases: Secondary | ICD-10-CM | POA: Insufficient documentation

## 2018-12-18 DIAGNOSIS — Z79899 Other long term (current) drug therapy: Secondary | ICD-10-CM | POA: Insufficient documentation

## 2018-12-18 DIAGNOSIS — W010XXA Fall on same level from slipping, tripping and stumbling without subsequent striking against object, initial encounter: Secondary | ICD-10-CM | POA: Insufficient documentation

## 2018-12-18 DIAGNOSIS — Y998 Other external cause status: Secondary | ICD-10-CM | POA: Insufficient documentation

## 2018-12-18 DIAGNOSIS — Z7901 Long term (current) use of anticoagulants: Secondary | ICD-10-CM | POA: Diagnosis not present

## 2018-12-18 DIAGNOSIS — W19XXXA Unspecified fall, initial encounter: Secondary | ICD-10-CM | POA: Diagnosis not present

## 2018-12-18 DIAGNOSIS — I10 Essential (primary) hypertension: Secondary | ICD-10-CM | POA: Insufficient documentation

## 2018-12-18 DIAGNOSIS — S52101A Unspecified fracture of upper end of right radius, initial encounter for closed fracture: Secondary | ICD-10-CM | POA: Diagnosis not present

## 2018-12-18 DIAGNOSIS — I2510 Atherosclerotic heart disease of native coronary artery without angina pectoris: Secondary | ICD-10-CM | POA: Diagnosis not present

## 2018-12-18 DIAGNOSIS — J449 Chronic obstructive pulmonary disease, unspecified: Secondary | ICD-10-CM | POA: Insufficient documentation

## 2018-12-18 LAB — COMPREHENSIVE METABOLIC PANEL
ALT: 15 U/L (ref 0–44)
AST: 23 U/L (ref 15–41)
Albumin: 4 g/dL (ref 3.5–5.0)
Alkaline Phosphatase: 86 U/L (ref 38–126)
Anion gap: 14 (ref 5–15)
BUN: 16 mg/dL (ref 8–23)
CO2: 26 mmol/L (ref 22–32)
Calcium: 9.5 mg/dL (ref 8.9–10.3)
Chloride: 100 mmol/L (ref 98–111)
Creatinine, Ser: 0.86 mg/dL (ref 0.61–1.24)
GFR calc Af Amer: >60 mL/min (ref >60)
GFR calc non Af Amer: >60 mL/min (ref >60)
Glucose, Bld: 140 mg/dL — ABNORMAL HIGH (ref 70–99)
Potassium: 4.3 mmol/L (ref 3.5–5.1)
Sodium: 140 mmol/L (ref 135–145)
Total Bilirubin: 0.9 mg/dL (ref 0.3–1.2)
Total Protein: 7 g/dL (ref 6.5–8.1)

## 2018-12-18 LAB — CBC WITH DIFFERENTIAL/PLATELET
Abs Immature Granulocytes: 0.04 10*3/uL (ref 0.00–0.07)
Abs Immature Granulocytes: 0.01 10*3/uL (ref 0.00–0.07)
Basophils Absolute: 0.1 10*3/uL (ref 0.0–0.1)
Basophils Absolute: 0 10*3/uL (ref 0.0–0.1)
Basophils Relative: 1 %
Basophils Relative: 0 %
Eosinophils Absolute: 0.1 10*3/uL (ref 0.0–0.5)
Eosinophils Absolute: 0 10*3/uL (ref 0.0–0.5)
Eosinophils Relative: 1 %
Eosinophils Relative: 0 %
HCT: 45.9 % (ref 39.0–52.0)
HCT: 45.5 % (ref 39.0–52.0)
Hemoglobin: 15 g/dL (ref 13.0–17.0)
Hemoglobin: 15 g/dL (ref 13.0–17.0)
Immature Granulocytes: 0 %
Immature Granulocytes: 0 %
Lymphocytes Relative: 9 %
Lymphocytes Relative: 8 %
Lymphs Abs: 1 10*3/uL (ref 0.7–4.0)
Lymphs Abs: 0.5 10*3/uL — ABNORMAL LOW (ref 0.7–4.0)
MCH: 32.1 pg (ref 26.0–34.0)
MCH: 32.4 pg (ref 26.0–34.0)
MCHC: 32.7 g/dL (ref 30.0–36.0)
MCHC: 33 g/dL (ref 30.0–36.0)
MCV: 98.3 fL (ref 80.0–100.0)
MCV: 98.3 fL (ref 80.0–100.0)
Monocytes Absolute: 0.8 10*3/uL (ref 0.1–1.0)
Monocytes Absolute: 0.2 10*3/uL (ref 0.1–1.0)
Monocytes Relative: 8 %
Monocytes Relative: 3 %
Neutro Abs: 8.7 10*3/uL — ABNORMAL HIGH (ref 1.7–7.7)
Neutro Abs: 5 10*3/uL (ref 1.7–7.7)
Neutrophils Relative %: 81 %
Neutrophils Relative %: 89 %
Platelets: 220 10*3/uL (ref 150–400)
Platelets: 227 10*3/uL (ref 150–400)
RBC: 4.67 MIL/uL (ref 4.22–5.81)
RBC: 4.63 MIL/uL (ref 4.22–5.81)
RDW: 15 % (ref 11.5–15.5)
RDW: 15 % (ref 11.5–15.5)
WBC: 10.7 10*3/uL — ABNORMAL HIGH (ref 4.0–10.5)
WBC: 5.6 10*3/uL (ref 4.0–10.5)
nRBC: 0 % (ref 0.0–0.2)
nRBC: 0 % (ref 0.0–0.2)

## 2018-12-18 LAB — BASIC METABOLIC PANEL
Anion gap: 13 (ref 5–15)
BUN: 14 mg/dL (ref 8–23)
CO2: 24 mmol/L (ref 22–32)
Calcium: 9.5 mg/dL (ref 8.9–10.3)
Chloride: 98 mmol/L (ref 98–111)
Creatinine, Ser: 0.96 mg/dL (ref 0.61–1.24)
GFR calc Af Amer: >60 mL/min (ref >60)
GFR calc non Af Amer: >60 mL/min (ref >60)
Glucose, Bld: 158 mg/dL — ABNORMAL HIGH (ref 70–99)
Potassium: 4.7 mmol/L (ref 3.5–5.1)
Sodium: 135 mmol/L (ref 135–145)

## 2018-12-18 MED ORDER — PALONOSETRON HCL INJECTION 0.25 MG/5ML
0.2500 mg | Freq: Once | INTRAVENOUS | Status: AC
  Administered 2018-12-18: 0.25 mg via INTRAVENOUS

## 2018-12-18 MED ORDER — SODIUM CHLORIDE 0.9 % IV SOLN
Freq: Once | INTRAVENOUS | Status: AC
  Administered 2018-12-18: 10:00:00 via INTRAVENOUS

## 2018-12-18 MED ORDER — CEFAZOLIN SODIUM-DEXTROSE 2-4 GM/100ML-% IV SOLN
2.0000 g | Freq: Once | INTRAVENOUS | Status: AC
  Administered 2018-12-18: 2 g via INTRAVENOUS
  Filled 2018-12-18: qty 100

## 2018-12-18 MED ORDER — HYDROMORPHONE HCL 1 MG/ML IJ SOLN
1.0000 mg | Freq: Once | INTRAMUSCULAR | Status: AC
  Administered 2018-12-18: 1 mg via INTRAVENOUS
  Filled 2018-12-18: qty 1

## 2018-12-18 MED ORDER — SODIUM CHLORIDE 0.9 % IV SOLN
5.0000 mg/kg | Freq: Once | INTRAVENOUS | Status: AC
  Administered 2018-12-18: 500 mg via INTRAVENOUS
  Filled 2018-12-18: qty 16

## 2018-12-18 MED ORDER — HYDROCODONE-ACETAMINOPHEN 5-325 MG PO TABS: 1.0000 | ORAL_TABLET | Freq: Four times a day (QID) | ORAL | 0 refills | Status: DC | PRN

## 2018-12-18 MED ORDER — ETOMIDATE 2 MG/ML IV SOLN
INTRAVENOUS | Status: AC | PRN
  Administered 2018-12-18: 10 mg via INTRAVENOUS

## 2018-12-18 MED ORDER — PALONOSETRON HCL INJECTION 0.25 MG/5ML
INTRAVENOUS | Status: AC
  Filled 2018-12-18: qty 5

## 2018-12-18 MED ORDER — SODIUM CHLORIDE 0.9 % IV SOLN
2400.0000 mg/m2 | INTRAVENOUS | Status: DC
  Administered 2018-12-18: 5650 mg via INTRAVENOUS
  Filled 2018-12-18: qty 113

## 2018-12-18 MED ORDER — ETOMIDATE 2 MG/ML IV SOLN
10.0000 mg | Freq: Once | INTRAVENOUS | Status: DC
  Filled 2018-12-18: qty 10

## 2018-12-18 MED ORDER — SODIUM CHLORIDE 0.9 % IV SOLN
900.0000 mg | Freq: Once | INTRAVENOUS | Status: AC
  Administered 2018-12-18: 900 mg via INTRAVENOUS
  Filled 2018-12-18: qty 10

## 2018-12-18 MED ORDER — SODIUM CHLORIDE 0.9 % IV SOLN
10.0000 mg | Freq: Once | INTRAVENOUS | Status: AC
  Administered 2018-12-18: 10 mg via INTRAVENOUS
  Filled 2018-12-18: qty 10

## 2018-12-18 MED ORDER — ATROPINE SULFATE 1 MG/ML IJ SOLN
0.5000 mg | Freq: Once | INTRAMUSCULAR | Status: AC
  Administered 2018-12-18: 10:00:00 via INTRAVENOUS

## 2018-12-18 MED ORDER — ONDANSETRON HCL 4 MG/2ML IJ SOLN
4.0000 mg | Freq: Once | INTRAMUSCULAR | Status: AC
  Administered 2018-12-18: 4 mg via INTRAVENOUS
  Filled 2018-12-18: qty 2

## 2018-12-18 MED ORDER — ATROPINE SULFATE 1 MG/ML IJ SOLN
INTRAMUSCULAR | Status: AC
  Filled 2018-12-18: qty 1

## 2018-12-18 MED ORDER — SODIUM CHLORIDE 0.9% FLUSH: 10.0000 mL | INTRAVENOUS | Status: DC | PRN

## 2018-12-18 MED ORDER — FLUOROURACIL CHEMO INJECTION 2.5 GM/50ML
400.0000 mg/m2 | Freq: Once | INTRAVENOUS | Status: AC
  Administered 2018-12-18: 950 mg via INTRAVENOUS
  Filled 2018-12-18: qty 19

## 2018-12-18 MED ORDER — HEPARIN SOD (PORK) LOCK FLUSH 100 UNIT/ML IV SOLN: 500.0000 [IU] | Freq: Once | INTRAVENOUS | Status: DC | PRN

## 2018-12-18 NOTE — Progress Notes (Signed)
Pt presents today for treatment and f/u with Dr. Delton Coombes. Labs drawn and pending. MAR reviewed. Pt has no complaints of any significant changes since the last visit. Pt has complaints of pain rated a 5/10 on the right wrist. Pt has concerns about a CT scan to be performed tomorrow and having his 5FU pump infusing. Pt instructed to speak with Dr. Delton Coombes pertaining to home infusion and CT scan.    Message received to proceed with treatment by ATravis LPN. Labs and vital signs reviewed by Dr. Delton Coombes. HR elevated110 upon arrival. MD aware.   Treatment given today per MD orders. Tolerated infusion without adverse affects. Vital signs stable. No complaints at this time. 5FU pump infusing per protocol. RUN noted on screen. Discharged from clinic ambulatory. F/U with [facility] as scheduled.

## 2018-12-18 NOTE — ED Triage Notes (Addendum)
Pt had surgery 6 weeks ago to right wrist, tonight pt fell landing on right wrist, obvious deformity noted per EMS, 4 mg morphine IV was given by EMS. Pt right arm in sling placed by EMS. Pt has chemo drug infusing via portable infusion pump that is connected to port a cath to left chest. Pt currently undergoing chemo for colon CA. Pt was scheduled to have f/u CT scan in the morning on right wrist from previous surgery.

## 2018-12-18 NOTE — ED Provider Notes (Signed)
.  Sedation  Date/Time: 12/18/2018 11:29 PM Performed by: Fredia Sorrow, MD Authorized by: Fredia Sorrow, MD   Consent:    Consent obtained:  Verbal   Consent given by:  Patient   Risks discussed:  Allergic reaction, dysrhythmia, inadequate sedation, nausea, prolonged hypoxia resulting in organ damage, respiratory compromise necessitating ventilatory assistance and intubation and vomiting   Alternatives discussed:  Analgesia without sedation Universal protocol:    Procedure explained and questions answered to patient or proxy's satisfaction: yes     Relevant documents present and verified: yes     Test results available and properly labeled: yes     Imaging studies available: yes     Required blood products, implants, devices, and special equipment available: yes     Site/side marked: yes     Immediately prior to procedure a time out was called: yes   Indications:    Procedure performed:  Fracture reduction Pre-sedation assessment:    Time since last food or drink:  2   NPO status caution: urgency dictates proceeding with non-ideal NPO status     ASA classification: class 3 - patient with severe systemic disease     Neck mobility: normal     Mouth opening:  3 or more finger widths   Mallampati score:  I - soft palate, uvula, fauces, pillars visible   Pre-sedation assessments completed and reviewed: airway patency, cardiovascular function, hydration status, mental status, pain level, respiratory function and temperature   Immediate pre-procedure details:    Reassessment: Patient reassessed immediately prior to procedure     Reviewed: vital signs     Verified: bag valve mask available, emergency equipment available, intubation equipment available, IV patency confirmed, oxygen available and suction available   Procedure details (see MAR for exact dosages):    Preoxygenation:  Nasal cannula   Analgesia:  Hydromorphone   Intra-procedure monitoring:  Blood pressure monitoring,  continuous capnometry, continuous pulse oximetry, cardiac monitor, frequent vital sign checks and frequent LOC assessments   Intra-procedure events: none     Total Provider sedation time (minutes):  10 Post-procedure details:    Attendance: Constant attendance by certified staff until patient recovered     Recovery: Patient returned to pre-procedure baseline     Post-sedation assessments completed and reviewed: airway patency, cardiovascular function, mental status and respiratory function     Patient is stable for discharge or admission: yes     Patient tolerance:  Tolerated well, no immediate complications     Fredia Sorrow, MD 12/18/18 [id number]

## 2018-12-18 NOTE — Progress Notes (Signed)
[place], [place] [address]   CLINIC:  Medical Oncology/Hematology  PCP:  Alanson Puls, [facility] [place] [address] [phone]   REASON FOR VISIT:  Follow-up for metastatic colon cancer   BRIEF ONCOLOGIC HISTORY:  Oncology History Overview Note  Stage IIIC Erik [name]) adenocarcinoma of transverse colon, diagnosed on colonoscopy by Dr. Oneida Alar on 01/12/2015 followed by definitive surgery by Dr. [name] with right hemicolectomy on 02/12/2015.  He then underwent FOLFOX x 12 cycles in the adjuvant setting (03/30/2015- 09/06/2015).   Malignant neoplasm of transverse colon ([place])  01/12/2015 Pathologic Stage   Colon, biopsy, distal transverse - TUBULOVILLOUS ADENOMA WITH HIGH GRADE DYSPLASIA.   01/12/2015 Procedure   Colonoscopy by Dr. Oneida Alar.   01/12/2015 Tumor Marker   CEA: 6.6 (H)    01/18/2015 Imaging   CT abd/pelvis- Apple-core lesion identified in the mid transverse colon without obstruction. No evidence for lymphadenopathy in the gastrohepatic ligament or omentum.  Stable 8 mm hypo attenuating lesion in the left liver, likely a cyst.   02/10/2015 Initial Diagnosis   Adenocarcinoma of transverse colon ([place])   02/12/2015 Definitive Surgery   [name], Extended right hemicolectomy    02/12/2015 Pathology Results   Mucinous adenocarcinoma with penetration of visceral peritoneum, 4/19 lymph nodes for metastatic disease, negative resection margins, with LVI and perineural invasion   03/30/2015 - 09/06/2015 Chemotherapy   FOLFOX x 12 cycles   05/25/2015 Treatment Plan Change   5 FU bolus discontinued for cycle #5   06/08/2015 Treatment Plan Change   Treatment deferred x 1 week   06/15/2015 Treatment Plan Change   5FU CI decreased by 10% and Oxaliplatin reduced by 15% for cycles #6-#11; Oxaliplatin dropped for cycle #12 d/t neuropathy.    10/20/2015 Imaging   CT CAP- Right hemicolectomy without evidence of  metastatic disease. 2. Previously measured ground-glass lesion in the left upper lobe has resolved. 3. Probable food debris in the stomach, simulating gastric wall thickening. Please correlate clinically. 4. 6 mm irregular nodular density in the left upper lobe, stable. Continued attention on followup exams is warranted.   01/25/2016 Procedure   Colonoscopy by Dr. Oneida Alar- Non-thrombosed external hemorrhoids found on digital rectal exam. - One 4 mm polyp in the rectum, removed with a cold biopsy forceps. Resected and retrieved. - Congested mucosa in the neo-terminal ileum. Biopsied. - Redundant colon. - Internal hemorrhoids.   01/26/2016 Pathology Results   1. Terminal ileum, biopsy - MILD ACUTE (ACTIVE) ILEITIS. - NO DYSPLASIA OR MALIGNANCY IDENTIFIED - SEE COMMENT. 2. Rectum, polyp(s) - HYPERPLASTIC POLYP (X 1). - NO DYSPLASIA OR MALIGNANCY IDENTIFIED.   04/13/2016 Imaging   CT chest- Stable CT chest. 6 mm irregular nodule anterior left upper lobe is stable. The scattered areas of peribronchovascular micro nodularity in the lungs bilaterally are unchanged.   10/19/2016 Imaging   CT CAP: 1. Status post right hemicolectomy. No findings to suggest metastatic disease in the abdomen or pelvis. 2. Aortic atherosclerosis. 3. Additional incidental findings, as above. Aortic Atherosclerosis (ICD10-I70.0).   01/09/2018 -  Chemotherapy   The patient had palonosetron (ALOXI) injection 0.25 mg, 0.25 mg, Intravenous,  Once, 23 of 25 cycles Administration: 0.25 mg (01/09/2018), 0.25 mg (01/23/2018), 0.25 mg (02/11/2018), 0.25 mg (02/25/2018), 0.25 mg (03/12/2018), 0.25 mg (03/27/2018), 0.25 mg (04/10/2018), 0.25 mg (04/24/2018), 0.25 mg (05/08/2018), 0.25 mg (05/22/2018), 0.25 mg (06/10/2018), 0.25 mg (06/26/2018), 0.25 mg (07/10/2018), 0.25 mg (07/24/2018), 0.25 mg (08/13/2018), 0.25 mg (08/27/2018),  0.25 mg (09/10/2018), 0.25 mg (09/24/2018), 0.25 mg (10/08/2018), 0.25 mg (11/05/2018), 0.25 mg (11/19/2018), 0.25 mg  (12/04/2018), 0.25 mg (12/18/2018) irinotecan (CAMPTOSAR) 420 mg in dextrose 5 % 500 mL chemo infusion, 180 mg/m2 = 420 mg, Intravenous,  Once, 9 of 9 cycles Administration: 420 mg (01/09/2018), 420 mg (01/23/2018), 420 mg (02/11/2018), 420 mg (02/25/2018), 420 mg (03/12/2018), 420 mg (03/27/2018), 420 mg (04/10/2018), 420 mg (04/24/2018), 420 mg (05/08/2018) leucovorin 900 mg in dextrose 5 % 250 mL infusion, 944 mg, Intravenous,  Once, 23 of 25 cycles Administration: 900 mg (01/09/2018), 900 mg (01/23/2018), 900 mg (02/11/2018), 900 mg (02/25/2018), 900 mg (03/12/2018), 900 mg (03/27/2018), 900 mg (04/10/2018), 900 mg (04/24/2018), 900 mg (05/08/2018), 900 mg (06/10/2018), 900 mg (06/26/2018), 900 mg (07/10/2018), 900 mg (07/24/2018), 900 mg (08/13/2018), 900 mg (08/27/2018), 900 mg (09/10/2018), 900 mg (09/24/2018), 900 mg (10/08/2018), 900 mg (11/05/2018), 900 mg (11/19/2018), 900 mg (12/04/2018), 900 mg (12/18/2018) fluorouracil (ADRUCIL) chemo injection 950 mg, 400 mg/m2 = 950 mg, Intravenous,  Once, 23 of 25 cycles Administration: 950 mg (01/09/2018), 950 mg (01/23/2018), 950 mg (02/11/2018), 950 mg (02/25/2018), 950 mg (03/12/2018), 950 mg (03/27/2018), 950 mg (04/10/2018), 950 mg (04/24/2018), 950 mg (05/08/2018), 950 mg (05/22/2018), 950 mg (06/10/2018), 950 mg (06/26/2018), 950 mg (07/10/2018), 950 mg (07/24/2018), 950 mg (08/13/2018), 950 mg (08/27/2018), 950 mg (09/10/2018), 950 mg (09/24/2018), 950 mg (10/08/2018), 950 mg (11/05/2018), 950 mg (11/19/2018), 950 mg (12/04/2018), 950 mg (12/18/2018) fluorouracil (ADRUCIL) 5,650 mg in sodium chloride 0.9 % 137 mL chemo infusion, 2,400 mg/m2 = 5,650 mg, Intravenous, 1 Day/Dose, 23 of 25 cycles Administration: 5,650 mg (01/09/2018), 5,650 mg (01/23/2018), 5,650 mg (02/11/2018), 5,650 mg (02/25/2018), 5,650 mg (03/12/2018), 5,650 mg (03/27/2018), 5,650 mg (04/10/2018), 5,650 mg (04/24/2018), 5,650 mg (05/08/2018), 5,650 mg (05/22/2018), 5,650 mg (06/10/2018), 5,650 mg (06/26/2018), 5,650 mg (07/10/2018), 5,650 mg (07/24/2018), 5,650 mg  (08/13/2018), 5,650 mg (08/27/2018), 5,650 mg (09/10/2018), 5,650 mg (09/24/2018), 5,650 mg (10/08/2018), 5,650 mg (11/05/2018), 5,650 mg (11/19/2018), 5,650 mg (12/04/2018), 5,650 mg (12/18/2018) bevacizumab-bvzr (ZIRABEV) 500 mg in sodium chloride 0.9 % 100 mL chemo infusion, 5 mg/kg = 500 mg (100 % of original dose 5 mg/kg), Intravenous,  Once, 3 of 5 cycles Dose modification: 5 mg/kg (original dose 5 mg/kg, Cycle 21) Administration: 500 mg (11/19/2018), 500 mg (12/04/2018), 500 mg (12/18/2018)  for chemotherapy treatment.    01/23/2018 Cancer Staging   Staging form: Colon and Rectum, AJCC 7th Edition - Pathologic: M1 - Signed by Zoila Shutter, MD on 01/23/2018   Malignant neoplasm of overlapping sites of colon ([place])  12/26/2017 Initial Diagnosis   Cancer of overlapping sites of colon metastatic to intra-abdominal lymph node ([place])   01/09/2018 -  Chemotherapy   The patient had palonosetron (ALOXI) injection 0.25 mg, 0.25 mg, Intravenous,  Once, 13 of 14 cycles Administration: 0.25 mg (01/09/2018), 0.25 mg (01/23/2018), 0.25 mg (02/11/2018), 0.25 mg (02/25/2018), 0.25 mg (03/12/2018), 0.25 mg (03/27/2018), 0.25 mg (04/10/2018), 0.25 mg (04/24/2018), 0.25 mg (05/08/2018), 0.25 mg (05/22/2018), 0.25 mg (06/10/2018), 0.25 mg (06/26/2018), 0.25 mg (07/10/2018) irinotecan (CAMPTOSAR) 420 mg in dextrose 5 % 500 mL chemo infusion, 180 mg/m2 = 420 mg, Intravenous,  Once, 9 of 9 cycles Administration: 420 mg (01/09/2018), 420 mg (01/23/2018), 420 mg (02/11/2018), 420 mg (02/25/2018), 420 mg (03/12/2018), 420 mg (03/27/2018), 420 mg (04/10/2018), 420 mg (04/24/2018), 420 mg (05/08/2018) leucovorin 900 mg in dextrose 5 % 250 mL infusion, 944 mg, Intravenous,  Once, 13 of 14 cycles Administration: 900 mg (01/09/2018), 900 mg (01/23/2018), 900 mg (  02/11/2018), 900 mg (02/25/2018), 900 mg (03/12/2018), 900 mg (03/27/2018), 900 mg (04/10/2018), 900 mg (04/24/2018), 900 mg (05/08/2018), 900 mg (06/10/2018), 900 mg (06/26/2018), 900 mg (07/10/2018) fluorouracil  (ADRUCIL) chemo injection 950 mg, 400 mg/m2 = 950 mg, Intravenous,  Once, 13 of 14 cycles Administration: 950 mg (01/09/2018), 950 mg (01/23/2018), 950 mg (02/11/2018), 950 mg (02/25/2018), 950 mg (03/12/2018), 950 mg (03/27/2018), 950 mg (04/10/2018), 950 mg (04/24/2018), 950 mg (05/08/2018), 950 mg (05/22/2018), 950 mg (06/10/2018), 950 mg (06/26/2018), 950 mg (07/10/2018) fluorouracil (ADRUCIL) 5,650 mg in sodium chloride 0.9 % 137 mL chemo infusion, 2,400 mg/m2 = 5,650 mg, Intravenous, 1 Day/Dose, 13 of 14 cycles Administration: 5,650 mg (01/09/2018), 5,650 mg (01/23/2018), 5,650 mg (02/11/2018), 5,650 mg (02/25/2018), 5,650 mg (03/12/2018), 5,650 mg (03/27/2018), 5,650 mg (04/10/2018), 5,650 mg (04/24/2018), 5,650 mg (05/08/2018), 5,650 mg (05/22/2018), 5,650 mg (06/10/2018), 5,650 mg (06/26/2018), 5,650 mg (07/10/2018)  for chemotherapy treatment.    Colon cancer metastasized to multiple sites [facility])  01/16/2018 Initial Diagnosis   Colon cancer metastasized to multiple sites [facility])   01/23/2018 - 02/10/2018 Chemotherapy   The patient had panitumumab (VECTIBIX) 600 mg in sodium chloride 0.9 % 100 mL chemo infusion, 640 mg, Intravenous,  Once, 1 of 3 cycles Administration: 600 mg (01/23/2018)  for chemotherapy treatment.    03/12/2018 - 10/21/2018 Chemotherapy   The patient had bevacizumab (AVASTIN) 500 mg in sodium chloride 0.9 % 100 mL chemo infusion, 525 mg, Intravenous,  Once, 15 of 22 cycles Administration: 500 mg (03/12/2018), 500 mg (03/27/2018), 500 mg (04/10/2018), 500 mg (04/24/2018), 500 mg (06/10/2018), 500 mg (05/08/2018), 500 mg (05/22/2018), 500 mg (06/26/2018), 500 mg (07/10/2018), 500 mg (07/24/2018), 500 mg (08/13/2018), 500 mg (08/27/2018), 500 mg (09/10/2018), 500 mg (09/24/2018), 500 mg (10/08/2018)  for chemotherapy treatment.       CANCER STAGING: Cancer Staging Malignant neoplasm of transverse colon [facility] Staging form: Colon and Rectum, AJCC 7th Edition - Pathologic stage from 03/04/2015: Stage IIIC (T4a, N2a, cM0) - Signed by  Baird Cancer, PA-C on 03/04/2015 - Pathologic: M1 - Signed by Zoila Shutter, MD on 01/23/2018    INTERVAL HISTORY:  Erik [name] 63 y.o. male seen for follow-up of metastatic colon cancer.  Appetite is 75%.  Energy levels are 50%.  Pain in the right hand is reported as 8 out of 10.  He is reportedly having CT scan of the right hand tomorrow.  He had some diarrhea which has been stable.  He gets nausea from acid reflux.  No bleeding issues reported.  Denies fevers, night sweats or weight loss.  REVIEW OF SYSTEMS:  Review of Systems  Gastrointestinal: Positive for diarrhea.  Neurological: Positive for numbness.  All other systems reviewed and are negative.    PAST MEDICAL/SURGICAL HISTORY:  Past Medical History:  Diagnosis Date   Abnormal stress echocardiogram    Adenocarcinoma of transverse colon ([place]) 02/10/2015   Alcohol abuse    Heavy Use up until 2010   Anxiety    Blood transfusion without reported diagnosis    Cancer of overlapping sites of colon metastatic to intra-abdominal lymph node ([place]) 12/26/2017   COPD (chronic obstructive pulmonary disease) ([facility])    Depression    Emphysema of lung ([facility])    GERD (gastroesophageal reflux disease)    Head trauma 2001   closed head injury; coma for 4 weeks   Hypercholesterolemia    Hypertension    ING HERN W/GANGREN RECUR UNILAT/UNSPEC ING HERN 04/21/2009   Qualifier: Diagnosis of  By: Verl Blalock, MD,  [place] [facility]    PTSD (post-traumatic stress disorder)    Pulmonary nodule 06/14/2016   SAH (subarachnoid hemorrhage) ([place]) 08/31/2012   SDH (subdural hematoma) ([place]) 08/31/2012   Past Surgical History:  Procedure Laterality Date   BIOPSY  01/25/2016   Procedure: BIOPSY;  Surgeon: Danie Binder, MD;  [facility];  Service: Endoscopy;;  ileum;    cardiac cath     COLONOSCOPY  2011   Dr. Oneida Alar: multiple adenomas and hyperplastic polyps   COLONOSCOPY WITH PROPOFOL N/A 01/12/2015   Procedure: COLONOSCOPY WITH  PROPOFOL;  Surgeon: Danie Binder, MD;  [facility];  Service: Endoscopy;  Laterality: N/A;  1030   COLONOSCOPY WITH PROPOFOL N/A 01/25/2016   Procedure: COLONOSCOPY WITH PROPOFOL;  Surgeon: Danie Binder, MD;  [facility];  Service: Endoscopy;  Laterality: N/A;  [place]   ESOPHAGOGASTRODUODENOSCOPY (EGD) WITH PROPOFOL N/A 01/12/2015   Procedure: ESOPHAGOGASTRODUODENOSCOPY (EGD) WITH PROPOFOL;  Surgeon: Danie Binder, MD;  [facility];  Service: Endoscopy;  Laterality: N/A;   head injury surgery     HERNIA REPAIR Right 2012   [facility]   KIDNEY SURGERY     >30 years ago   LAPAROSCOPIC RIGHT HEMI COLECTOMY Left 02/10/2015   Procedure: LAPAROSCOPIC THEN OPEN RIGHT HEMI COLECTOMY;  Surgeon: [name], MD;  Location: [facility];  Service: General;  Laterality: Left;   LAPAROTOMY N/A 12/19/2017   Procedure: EXPLORATORY LAPAROTOMY;  Surgeon: Aviva Signs, MD;  Location: [facility];  Service: General;  Laterality: N/A;   OPEN REDUCTION INTERNAL FIXATION (ORIF) DISTAL RADIAL FRACTURE Right 10/18/2018   Procedure: OPEN REDUCTION INTERNAL FIXATION (ORIF) RIGHT DISTAL RADIAL FRACTURE;  Surgeon: [name], MD;  Location: [facility];  Service: Orthopedics;  Laterality: Right;  block in preop   POLYPECTOMY  01/25/2016   Procedure: POLYPECTOMY;  Surgeon: Danie Binder, MD;  [facility];  Service: Endoscopy;;  colon   PORT-A-CATH REMOVAL N/A 02/26/2017   Procedure: MINOR REMOVAL PORT-A-CATH;  Surgeon: Aviva Signs, MD;  Location: [facility];  Service: General;  Laterality: N/A;  Pt to arrive at [place] N/A 03/24/2015   Procedure: INSERTION PORT-A-CATH;  Surgeon: Jules Husbands, MD;  Location: [facility];  Service: General;  Laterality: N/A;   PORTACATH PLACEMENT Left 01/07/2018   Procedure: INSERTION PORT A CATH (ATTACHED CATHETER IN LEFT SUBCLAVIAN);  Surgeon: Aviva Signs, MD;  Location:  [facility];  Service: General;  Laterality: Left;     SOCIAL HISTORY:  Social History   Socioeconomic History   Marital status: Divorced    Spouse name: Not on file   Number of children: 2   Years of education: 12   Highest education level: Not on file  Occupational History   Occupation: disabled/unemployed   Occupation: unemployed/Mediicaid only    Comment: NO INCOME  Social Designer, fashion/clothing strain: Very hard   Food insecurity    Worry: Often true    Inability: Often true   Transportation needs    Medical: Yes    Non-medical: No  Tobacco Use   Smoking status: Former Smoker    Packs/day: 0.10    Years: 30.00    Pack years: 3.00    Types: Cigarettes    Start date: 02/06/1973    Quit date: 12/09/2017    Years since quitting: 1.0   Smokeless tobacco: Never Used   Tobacco comment:  Quit on 12/09/2017  Substance and Sexual Activity   Alcohol use: Yes    Alcohol/week: 0.0 standard drinks    Comment: beer occ, history of ETOH abuse in remote past.    Drug use: No   Sexual activity: Never    Partners: Female    Birth control/protection: None  Lifestyle   Physical activity    Days per week: Not on file    Minutes per session: Not on file   Stress: Not on file  Relationships   Social connections    Talks on phone: Not on file    Gets together: Not on file    Attends religious service: Not on file    Active member of club or organization: Not on file    Attends meetings of clubs or organizations: Not on file    Relationship status: Not on file   Intimate partner violence    Fear of current or ex partner: Not on file    Emotionally abused: Not on file    Physically abused: Not on file    Forced sexual activity: Not on file  Other Topics Concern   Not on file  Social History Narrative   Single   Lives alone   Turned down for disability    FAMILY HISTORY:  Family History  Problem Relation Age of Onset   Pulmonary embolism Mother     Breast cancer Mother    Cancer Mother        breast cancer   Arthritis Mother    Heart disease Father    Cancer Father 58       Leukemia   Cancer Paternal Grandfather        Lung   Ataxia Neg Hx    Chorea Neg Hx    Dementia Neg Hx    Mental retardation Neg Hx    Migraines Neg Hx    Multiple sclerosis Neg Hx    Neurofibromatosis Neg Hx    Neuropathy Neg Hx    Parkinsonism Neg Hx    Seizures Neg Hx    Stroke Neg Hx    Colon cancer Neg Hx     CURRENT MEDICATIONS:  Outpatient Encounter Medications as of 12/18/2018  Medication Sig Note   amitriptyline (ELAVIL) 25 MG tablet Take 1 tablet (25 mg total) by mouth at bedtime.    atorvastatin (LIPITOR) 20 MG tablet TAKE 1 TABLET BY MOUTH AT BEDTIME (Patient taking differently: Take 20 mg by mouth daily. )    buPROPion (WELLBUTRIN SR) 150 MG 12 hr tablet Take 150 mg by mouth every morning.     busPIRone (BUSPAR) 10 MG tablet Take 10 mg by mouth 2 (two) times daily.    fluorouracil CALGB 16109 in sodium chloride 0.9 % 150 mL Inject 5,650 mg into the vein. Over 46 hours    FLUoxetine (PROZAC) 20 MG capsule TAKE 1 CAPSULE BY MOUTH 2 TIMES A DAY (Patient taking differently: Take 20 mg by mouth 2 (two) times daily. ) 06/10/2018: 1 tab TID   gabapentin (NEURONTIN) 300 MG capsule TAKE 1 CAPSULE BY MOUTH THREE TIMES A DAY    IRINOTECAN HCL IV Inject 420 mg into the vein every 14 (fourteen) days.    LEUCOVORIN CALCIUM IV Inject 944 mg into the vein every 14 (fourteen) days.    lidocaine-prilocaine (EMLA) cream APPLY A SMALL AMOUNT OVER PORT SITE AND COVER WITH PLASTIC WRAP ONE HOUR PRIOR TO APPOINTMENT    loperamide (IMODIUM) 2 MG capsule TAKE 2 CAPSULES  BY MOUTH TWICE DAILY    omeprazole (PRILOSEC) 20 MG capsule Take 20 mg by mouth every morning.     ondansetron (ZOFRAN) 8 MG tablet Take 1 tablet (8 mg total) by mouth 2 (two) times daily as needed for refractory nausea / vomiting. Start on day 3 after chemotherapy.     oxyCODONE-acetaminophen (PERCOCET/ROXICET) 5-325 MG tablet Take 1-2 tablets by mouth every 6 (six) hours as needed for severe pain.    PROAIR HFA 108 (90 Base) MCG/ACT inhaler INHALE 2 PUFFS BY MOUTH EVERY 6 HOURS AS NEEDED FOR SHORTNESS OF BREATH/WHEEZING.    prochlorperazine (COMPAZINE) 10 MG tablet Take 1 tablet (10 mg total) by mouth every 6 (six) hours as needed (NAUSEA).    rivaroxaban (XARELTO) 20 MG TABS tablet Take 1 tablet (20 mg total) by mouth daily with supper.    Vitamin D, Ergocalciferol, (DRISDOL) 1.25 MG (50000 UT) CAPS capsule Take 50,000 Units by mouth once a week.    WELLBUTRIN XL 150 MG 24 hr tablet Take 150 mg by mouth every morning.    Facility-Administered Encounter Medications as of 12/18/2018  Medication   [COMPLETED] 0.9 %  sodium chloride infusion   [COMPLETED] atropine injection 0.5 mg   [COMPLETED] bevacizumab-bvzr (ZIRABEV) 500 mg in sodium chloride 0.9 % 100 mL chemo infusion   [COMPLETED] dexamethasone (DECADRON) 10 mg in sodium chloride 0.9 % 50 mL IVPB   [COMPLETED] fluorouracil (ADRUCIL) chemo injection 950 mg   [COMPLETED] leucovorin 900 mg in sodium chloride 0.9 % 250 mL infusion   [COMPLETED] palonosetron (ALOXI) injection 0.25 mg   sodium chloride flush (NS) 0.9 % injection 10 mL   [DISCONTINUED] fluorouracil (ADRUCIL) 5,650 mg in sodium chloride 0.9 % 137 mL chemo infusion   [DISCONTINUED] heparin lock flush 100 unit/mL   [DISCONTINUED] sodium chloride flush (NS) 0.9 % injection 10 mL    ALLERGIES:  No Known Allergies   PHYSICAL EXAM:  ECOG Performance status: 1  Vitals:   12/18/18 0812  BP: (!) 151/89  Pulse: (!) 110  Resp: 18  Temp: (!) 96.8 F (36 C)  SpO2: 98%   Filed Weights   12/18/18 0812  Weight: 219 lb 6.4 oz (99.5 kg)    Physical Exam Vitals signs reviewed.  Constitutional:      Appearance: Normal appearance.  Cardiovascular:     Rate and Rhythm: Normal rate and regular rhythm.     Heart sounds: Normal  heart sounds.  Pulmonary:     Effort: Pulmonary effort is normal.     Breath sounds: Normal breath sounds.  Abdominal:     General: There is no distension.     Palpations: Abdomen is soft. There is no mass.  Musculoskeletal:        General: No swelling.  Skin:    General: Skin is warm.  Neurological:     General: No focal deficit present.     Mental Status: He is alert and oriented to person, place, and time.  Psychiatric:        Mood and Affect: Mood normal.        Behavior: Behavior normal.      LABORATORY DATA:  I have reviewed the labs as listed.  CBC    Component Value Date/Time   WBC 10.7 (H) 12/18/2018 0801   RBC 4.67 12/18/2018 0801   HGB 15.0 12/18/2018 0801   HCT 45.9 12/18/2018 0801   PLT 220 12/18/2018 0801   MCV 98.3 12/18/2018 0801   MCH 32.1 12/18/2018 0801  MCHC 32.7 12/18/2018 0801   RDW 15.0 12/18/2018 0801   LYMPHSABS 1.0 12/18/2018 0801   MONOABS 0.8 12/18/2018 0801   EOSABS 0.1 12/18/2018 0801   BASOSABS 0.1 12/18/2018 0801   CMP Latest Ref Rng & Units 12/18/2018 12/04/2018 11/19/2018  Glucose 70 - 99 mg/dL 140(H) 102(H) 105(H)  BUN 8 - 23 mg/dL '16 10 12  ' Creatinine 0.61 - 1.24 mg/dL 0.86 0.71 0.71  Sodium 135 - 145 mmol/L 140 141 140  Potassium 3.5 - 5.1 mmol/L 4.3 4.0 4.3  Chloride 98 - 111 mmol/L 100 102 103  CO2 22 - 32 mmol/L '26 29 26  ' Calcium 8.9 - 10.3 mg/dL 9.5 9.3 9.1  Total Protein 6.5 - 8.1 g/dL 7.0 6.6 6.5  Total Bilirubin 0.3 - 1.2 mg/dL 0.9 0.5 0.5  Alkaline Phos 38 - 126 U/L 86 78 76  AST 15 - 41 U/L '23 19 16  ' ALT 0 - 44 U/L '15 21 14       ' DIAGNOSTIC IMAGING:  I have independently reviewed the scans and discussed with the patient.   I have reviewed Venita Lick LPN's note and agree with the documentation.  I personally performed a face-to-face visit, made revisions and my assessment and plan is as follows.    ASSESSMENT & PLAN:   Malignant neoplasm of transverse colon ([place]) 1.  Metastatic colon cancer, K-ras  mutation positive, MSI stable: -Status post FOLFOX for 12 cycles and adjuvant setting from 03/30/2015 through 09/06/2015. - PET scan on 11/19/2017 showing peritoneal carcinomatosis, right paratracheal lymph node, nonspecific increased uptake in the right hilar region.  CEA was 12.9 on 11/16/2017. -9 cycles of FOLFIRI from 01/09/2018 through 05/08/2018 with addition of bevacizumab during cycle 5. - Maintenance 5-FU/leucovorin and bevacizumab started on 05/22/2018. - He had a fracture of the right wrist with ORIF on 10/18/2018. -CT CAP on 12/03/2018 showed slight interval increase in the nodular metastatic soft tissue along the anterior midline ventral peritoneum, largest nodular deposit component measuring 3.6 x 1.7 cm.  Also interval enlargement of metastatic nodule/lymph nodes in the central anterior abdomen, largest nodule measuring 1 cm, previously 0.5 cm.  Other mesenteric and peritoneal nodules are not significantly changed. -Because of equivocal findings and his delayed healing fracture of the right hand, we have continued maintenance therapy at this time. -We reviewed his labs.  He will proceed with his next treatment today without any dose changes. -He is having a CT scan of his right hand done tomorrow.  2.  Pulmonary embolism: -Incidental PE on CT chest on 05/20/2018.  CT angiogram confirmed bilateral PE. -He is on Xarelto and is tolerating it very well.  3.  Peripheral neuropathy: -Pre-existing neuropathy from prior oxaliplatin.  Numbness in the legs has been stable.  4.  Diarrhea: -Continue Lomotil 2 tablets twice daily.  Is well controlled.  Total time spent is 25 minutes with more than 50% of the time spent face-to-face discussing treatment plan and coordination of care.    Orders placed this encounter:  No orders of the defined types were placed in this encounter.     Derek Jack, MD [place] [phone]

## 2018-12-18 NOTE — Discharge Instructions (Addendum)
Call Dr. Fredna Dow tomorrow morning for an appointment time in the morning with his office (you will see either Dr. Burney Gauze or a PA who will arrange your surgery with Dr. Fredna Dow tomorrow to repair your new injury.  Do not eat or drink anything when you wake tomorrow.  You may take the medicine given you if needed for pain relief with a sip of water.  However, do not drive within 4 hours of taking this medicine as it will make you drowsy.

## 2018-12-18 NOTE — Patient Instructions (Signed)
[facility] at [facility] Discharge Instructions  You were seen today by Dr. Katragadda. He went over your recent lab results. He will see you back in 2 weeks for labs and follow up.   Thank you for choosing [facility] at [facility] to provide your oncology and hematology care.  To afford each patient quality time with our provider, please arrive at least 15 minutes before your scheduled appointment time.   If you have a lab appointment with the Cancer Center please come in thru the  Main Entrance and check in at the main information desk  You need to re-schedule your appointment should you arrive 10 or more minutes late.  We strive to give you quality time with our providers, and arriving late affects you and other patients whose appointments are after yours.  Also, if you no show three or more times for appointments you may be dismissed from the clinic at the providers discretion.     Again, thank you for choosing [facility].  Our hope is that these requests will decrease the amount of time that you wait before being seen by our physicians.       _____________________________________________________________  Should you have questions after your visit to [facility], please contact our office at [phone] between the hours of 8:00 a.m. and 4:30 p.m.  Voicemails left after 4:00 p.m. will not be returned until the following business day.  For prescription refill requests, have your pharmacy contact our office and allow 72 hours.    Cancer Center Support Programs:   > Cancer Support Group  2nd Tuesday of the month 1pm-2pm, Journey Room    

## 2018-12-18 NOTE — ED Provider Notes (Addendum)
Medical screening examination/treatment/procedure(s) were conducted as a shared visit with non-physician practitioner(s) and myself.  I personally evaluated the patient during the encounter.     Patient seen by me along with physician assistant.  Patient slipped and fell in the kitchen reinjuring his right wrist that had just undergone open reduction internal fixation 6 weeks ago by Dr. Dwyane Dee.  Patient is also under treatment for colon cancer and receiving on [place] and going chemo through a pump.  Patient was scheduled to have follow-up CT scan of the wrist tomorrow morning.  There is bleeding and significant deformity to the right wrist.  Good cap refill at the fingers.  Sensation intact.  Radial pulses palpable.  No injury at the elbow or shoulder.  Everything is distal forearm wrist area.  There is an open wound at the distal area of the radius on the anterior surface.  There is a puncture wound dark blood coming out of it.  Suggestive of possible open fracture.  X-ray portable shows fracture proximal to the metal plate that was put in on the radius.  And also a fracture of the ulnar.  Patient will need to be started on Ancef.  We will need to contact who is on-call for Dr. Dwyane Dee for hand and patient will need to be transferred for open fracture.   Fredia Sorrow, MD 12/18/18 2111     Fredia Sorrow, MD 12/18/18 2252

## 2018-12-18 NOTE — Assessment & Plan Note (Signed)
1.  Metastatic colon cancer, K-ras mutation positive, MSI stable: -Status post FOLFOX for 12 cycles and adjuvant setting from 03/30/2015 through 09/06/2015. - PET scan on 11/19/2017 showing peritoneal carcinomatosis, right paratracheal lymph node, nonspecific increased uptake in the right hilar region.  CEA was 12.9 on 11/16/2017. -9 cycles of FOLFIRI from 01/09/2018 through 05/08/2018 with addition of bevacizumab during cycle 5. - Maintenance 5-FU/leucovorin and bevacizumab started on 05/22/2018. - He had a fracture of the right wrist with ORIF on 10/18/2018. -CT CAP on 12/03/2018 showed slight interval increase in the nodular metastatic soft tissue along the anterior midline ventral peritoneum, largest nodular deposit component measuring 3.6 x 1.7 cm.  Also interval enlargement of metastatic nodule/lymph nodes in the central anterior abdomen, largest nodule measuring 1 cm, previously 0.5 cm.  Other mesenteric and peritoneal nodules are not significantly changed. -Because of equivocal findings and his delayed healing fracture of the right hand, we have continued maintenance therapy at this time. -We reviewed his labs.  He will proceed with his next treatment today without any dose changes. -He is having a CT scan of his right hand done tomorrow.  2.  Pulmonary embolism: -Incidental PE on CT chest on 05/20/2018.  CT angiogram confirmed bilateral PE. -He is on Xarelto and is tolerating it very well.  3.  Peripheral neuropathy: -Pre-existing neuropathy from prior oxaliplatin.  Numbness in the legs has been stable.  4.  Diarrhea: -Continue Lomotil 2 tablets twice daily.  Is well controlled.

## 2018-12-18 NOTE — Patient Instructions (Signed)
[facility] Discharge Instructions for Patients Receiving Chemotherapy  Today you received the following chemotherapy agents   To help prevent nausea and vomiting after your treatment, we encourage you to take your nausea medication   If you develop nausea and vomiting that is not controlled by your nausea medication, call the clinic.   BELOW ARE SYMPTOMS THAT SHOULD BE REPORTED IMMEDIATELY:  *FEVER GREATER THAN 100.5 F  *CHILLS WITH OR WITHOUT FEVER  NAUSEA AND VOMITING THAT IS NOT CONTROLLED WITH YOUR NAUSEA MEDICATION  *UNUSUAL SHORTNESS OF BREATH  *UNUSUAL BRUISING OR BLEEDING  TENDERNESS IN MOUTH AND THROAT WITH OR WITHOUT PRESENCE OF ULCERS  *URINARY PROBLEMS  *BOWEL PROBLEMS  UNUSUAL RASH Items with * indicate a potential emergency and should be followed up as soon as possible.  Feel free to call the clinic should you have any questions or concerns. The clinic phone number is [phone].  Please show the CHEMO ALERT CARD at check-in to the Emergency Department and triage nurse.   

## 2018-12-18 NOTE — ED Provider Notes (Signed)
[facility] EMERGENCY DEPARTMENT Provider Note   CSN: [id number] Arrival date & time: 12/18/18  1927     History   Chief Complaint Chief Complaint  Patient presents with  . Wrist Injury    right- surgery 6 weeks ago    HPI Erik [name] is a 63 y.o. male presenting with a recurrent injury to his right wrist.  He slipped and fell on water inside his kitchen just prior to arrival and landed on his right outstretched hand causing pain and obvious deformity to the wrist.  His past history is significant for an ORIF procedure of the same wrist 6 weeks ago by Dr. [name] secondary to a comminuted distal radial fracture.  This injury was healing, although he states he was scheduled for CT scan in the morning to reevaluate the prior injury.  He denies numbness in his fingertips and is able to move his digits but with severe pain radiating into the wrist.  He was splinted and given IV morphine prior to arrival with no significant improvement in his pain symptoms.  He denies other injury including head injury, also denies pain in his elbow or shoulder.     HPI  Past Medical History:  Diagnosis Date  . Abnormal stress echocardiogram   . Adenocarcinoma of transverse colon ([place]) 02/10/2015  . Alcohol abuse    Heavy Use up until 2010  . Anxiety   . Blood transfusion without reported diagnosis   . Cancer of overlapping sites of colon metastatic to intra-abdominal lymph node ([place]) 12/26/2017  . COPD (chronic obstructive pulmonary disease) ([place])   . Depression   . Emphysema of lung ([place])   . GERD (gastroesophageal reflux disease)   . Head trauma 2001   closed head injury; coma for 4 weeks  . Hypercholesterolemia   . Hypertension   . ING HERN W/GANGREN RECUR UNILAT/UNSPEC ING HERN 04/21/2009   Qualifier: Diagnosis of  By: Verl Blalock, MD, Delanna Ahmadi PTSD (post-traumatic stress disorder)   . Pulmonary nodule 06/14/2016  . SAH (subarachnoid hemorrhage) ([place]) 08/31/2012  . SDH (subdural  hematoma) ([place]) 08/31/2012    Patient Active Problem List   Diagnosis Date Noted  . Colon cancer metastasized to multiple sites ([place]) 01/16/2018  . Malignant neoplasm of overlapping sites of colon ([place]) 12/26/2017  . S/P exploratory laparotomy 12/19/2017  . Carcinomatosis ([place])   . Psoriasis 09/12/2016  . Pulmonary nodule 06/14/2016  . History of colon cancer   . Diarrhea 01/06/2016  . Cigarette nicotine dependence with nicotine-induced disorder 06/18/2015  . Chronic obstructive pulmonary disease ([place]) 06/18/2015  . Depression 06/18/2015  . B12 deficiency 04/14/2015  . Malignant neoplasm of transverse colon ([place]) 02/10/2015  . GERD (gastroesophageal reflux disease) 12/15/2014  . History of colonic polyps 12/15/2014  . History of alcohol abuse 09/16/2014  . Panic disorder 10/10/2012  . Major depressive disorder, single episode 10/10/2012  . Bilateral lower extremity edema 04/24/2012  . Hyperlipidemia 04/21/2009  . CAD, NATIVE VESSEL 03/25/2009  . HEAD TRAUMA 03/02/2009    Past Surgical History:  Procedure Laterality Date  . BIOPSY  01/25/2016   Procedure: BIOPSY;  Surgeon: Danie Binder, MD;  [facility]: [name];  Service: Endoscopy;;  ileum;   . cardiac cath    . COLONOSCOPY  2011   Dr. Oneida Alar: multiple adenomas and hyperplastic polyps  . COLONOSCOPY WITH PROPOFOL N/A 01/12/2015   Procedure: COLONOSCOPY WITH PROPOFOL;  Surgeon: Danie Binder, MD;  Location: [name];  Service: Endoscopy;  Laterality: N/A;  1030  . COLONOSCOPY WITH PROPOFOL N/A 01/25/2016   Procedure: COLONOSCOPY WITH PROPOFOL;  Surgeon: Danie Binder, MD;  Location: [name];  Service: Endoscopy;  Laterality: N/A;  1230  . CRANIOTOMY  2001  . ESOPHAGOGASTRODUODENOSCOPY (EGD) WITH PROPOFOL N/A 01/12/2015   Procedure: ESOPHAGOGASTRODUODENOSCOPY (EGD) WITH PROPOFOL;  Surgeon: Danie Binder, MD;  Location: [name];  Service: Endoscopy;  Laterality: N/A;  . head injury surgery    . HERNIA  REPAIR Right 2012   Inguinal- Forestine Na  . KIDNEY SURGERY     >30 years ago  . LAPAROSCOPIC RIGHT HEMI COLECTOMY Left 02/10/2015   Procedure: LAPAROSCOPIC THEN OPEN RIGHT HEMI COLECTOMY;  Surgeon: [name], MD;  Location: [facility];  Service: General;  Laterality: Left;  . LAPAROTOMY N/A 12/19/2017   Procedure: EXPLORATORY LAPAROTOMY;  Surgeon: Aviva Signs, MD;  Location: [facility];  Service: General;  Laterality: N/A;  . OPEN REDUCTION INTERNAL FIXATION (ORIF) DISTAL RADIAL FRACTURE Right 10/18/2018   Procedure: OPEN REDUCTION INTERNAL FIXATION (ORIF) RIGHT DISTAL RADIAL FRACTURE;  Surgeon: [name], MD;  Location: [place];  Service: Orthopedics;  Laterality: Right;  block in preop  . POLYPECTOMY  01/25/2016   Procedure: POLYPECTOMY;  Surgeon: Danie Binder, MD;  Location: [name];  Service: Endoscopy;;  colon  . PORT-A-CATH REMOVAL N/A 02/26/2017   Procedure: MINOR REMOVAL PORT-A-CATH;  Surgeon: Aviva Signs, MD;  Location: [facility];  Service: General;  Laterality: N/A;  Pt to arrive at [place] N/A 03/24/2015   Procedure: INSERTION PORT-A-CATH;  Surgeon: Jules Husbands, MD;  Location: [facility];  Service: General;  Laterality: N/A;  . PORTACATH PLACEMENT Left 01/07/2018   Procedure: INSERTION PORT A CATH (ATTACHED CATHETER IN LEFT SUBCLAVIAN);  Surgeon: Aviva Signs, MD;  Location: [facility];  Service: General;  Laterality: Left;        Home Medications    Prior to Admission medications   Medication Sig Start Date End Date Taking? Authorizing Provider  amitriptyline (ELAVIL) 25 MG tablet Take 1 tablet (25 mg total) by mouth at bedtime. 08/08/18   Tomi Likens, Adam R, DO  atorvastatin (LIPITOR) 20 MG tablet TAKE 1 TABLET BY MOUTH AT BEDTIME Patient taking differently: Take 20 mg by mouth daily.  05/29/18   Derek Jack, MD  buPROPion (WELLBUTRIN SR) 150 MG 12 hr tablet Take 150 mg by mouth every morning.     [provider]   busPIRone (BUSPAR) 10 MG tablet Take 10 mg by mouth 2 (two) times daily.    [provider]  fluorouracil CALGB 60454 in sodium chloride 0.9 % 150 mL Inject 5,650 mg into the vein. Over 46 hours    [provider]  FLUoxetine (PROZAC) 20 MG capsule TAKE 1 CAPSULE BY MOUTH 2 TIMES A DAY Patient taking differently: Take 20 mg by mouth 2 (two) times daily.  06/14/16   [name], PA-C  gabapentin (NEURONTIN) 300 MG capsule TAKE 1 CAPSULE BY MOUTH THREE TIMES A DAY 07/04/18   Derek Jack, MD  IRINOTECAN HCL IV Inject 420 mg into the vein every 14 (fourteen) days.    [provider]  LEUCOVORIN CALCIUM IV Inject 944 mg into the vein every 14 (fourteen) days.    [provider]  lidocaine-prilocaine (EMLA) cream APPLY A SMALL AMOUNT OVER PORT SITE AND COVER WITH PLASTIC WRAP ONE HOUR PRIOR TO APPOINTMENT 11/04/18   Derek Jack, MD  loperamide (  IMODIUM) 2 MG capsule TAKE 2 CAPSULES BY MOUTH TWICE DAILY 11/04/18   Derek Jack, MD  omeprazole (PRILOSEC) 20 MG capsule Take 20 mg by mouth every morning.     [provider]  ondansetron (ZOFRAN) 8 MG tablet Take 1 tablet (8 mg total) by mouth 2 (two) times daily as needed for refractory nausea / vomiting. Start on day 3 after chemotherapy. 12/27/17   [place], Mathis Dad, MD  oxyCODONE-acetaminophen (PERCOCET/ROXICET) 5-325 MG tablet Take 1-2 tablets by mouth every 6 (six) hours as needed for severe pain. 10/10/18   Hayden Rasmussen, MD  PROAIR HFA 108 [phone] Base) MCG/ACT inhaler INHALE 2 PUFFS BY MOUTH EVERY 6 HOURS AS NEEDED FOR SHORTNESS OF BREATH/WHEEZING. 02/13/17   Raylene Everts, MD  prochlorperazine (COMPAZINE) 10 MG tablet Take 1 tablet (10 mg total) by mouth every 6 (six) hours as needed (NAUSEA). 12/27/17   [place], Mathis Dad, MD  rivaroxaban (XARELTO) 20 MG TABS tablet Take 1 tablet (20 mg total) by mouth daily with supper. 06/26/18   Derek Jack, MD  Vitamin D, Ergocalciferol,  (DRISDOL) 1.25 MG (50000 UT) CAPS capsule Take 50,000 Units by mouth once a week. 12/05/18   [provider]  WELLBUTRIN XL 150 MG 24 hr tablet Take 150 mg by mouth every morning. 11/20/18   [provider]    Family History Family History  Problem Relation Age of Onset  . Pulmonary embolism Mother   . Breast cancer Mother   . Cancer Mother        breast cancer  . Arthritis Mother   . Heart disease Father   . Cancer Father 64       Leukemia  . Cancer Paternal Grandfather        Lung  . Ataxia Neg Hx   . Chorea Neg Hx   . Dementia Neg Hx   . Mental retardation Neg Hx   . Migraines Neg Hx   . Multiple sclerosis Neg Hx   . Neurofibromatosis Neg Hx   . Neuropathy Neg Hx   . Parkinsonism Neg Hx   . Seizures Neg Hx   . Stroke Neg Hx   . Colon cancer Neg Hx     Social History Social History   Tobacco Use  . Smoking status: Former Smoker    Packs/day: 0.10    Years: 30.00    Pack years: 3.00    Types: Cigarettes    Start date: 02/06/1973    Quit date: 12/09/2017    Years since quitting: 1.0  . Smokeless tobacco: Never Used  . Tobacco comment: Quit on 12/09/2017  Substance Use Topics  . Alcohol use: Yes    Alcohol/week: 0.0 standard drinks    Comment: beer occ, history of ETOH abuse in remote past.   . Drug use: No     Allergies   Patient has no known allergies.   Review of Systems Review of Systems  Constitutional: Negative for fever.  Musculoskeletal: Positive for arthralgias and joint swelling. Negative for myalgias.  Skin: Positive for wound.  Neurological: Negative for weakness and numbness.     Physical Exam Updated Vital Signs BP (!) 154/86   Pulse 89   Temp 97.9 F (36.6 C) (Oral)   Resp 17   Ht 6\' 2"  (1.88 m)   Wt 104.3 kg   SpO2 99%   BMI 29.53 kg/m   Physical Exam Constitutional:      Appearance: He is well-developed.  HENT:     Head:  Atraumatic.  Neck:     Musculoskeletal: Normal range of motion.  Cardiovascular:      Comments: Pulses equal bilaterally Musculoskeletal:        General: Swelling, tenderness and deformity present.     Right wrist: He exhibits bony tenderness, swelling and deformity.     Comments: Obvious deformity to right wrist. Distal sensation intact with less than 2 sec cap refill in all digits.  No right elbow or shoulder pain.    Skin:    General: Skin is warm and dry.     Comments: 0.5 cm puncture wound ventral distal right wrist.   Neurological:     Mental Status: He is alert.     Sensory: Sensation is intact. No sensory deficit.      ED Treatments / Results  Labs (all labs ordered are listed, but only abnormal results are displayed) Labs Reviewed  CBC WITH DIFFERENTIAL/PLATELET - Abnormal; Notable for the following components:      Result Value   Lymphs Abs 0.5 (*)    All other components within normal limits  BASIC METABOLIC PANEL - Abnormal; Notable for the following components:   Glucose, Bld 158 (*)    All other components within normal limits  SARS CORONAVIRUS 2 (TAT 6-24 HRS)    EKG None  Radiology Dg Wrist Complete Right  Result Date: 12/18/2018 CLINICAL DATA:  Status post reduction EXAM: RIGHT WRIST - COMPLETE 3+ VIEW COMPARISON:  Films from earlier in the same day FINDINGS: Splinting material is now seen. There is been interval reduction of the fracture fragments although there remains displacement at the ulnar fracture site of 1 bone with medially involving the distal fracture fragment. The other fracture fragments are in improved alignment. No new fracture is seen. IMPRESSION: Status post reduction with improved alignment of radial fractures although there remains 1 bone with displacement of the distal ulnar fragment. Electronically Signed   By: Inez Catalina M.D.   On: 12/18/2018 22:59   Dg Wrist Complete Right  Result Date: 12/18/2018 CLINICAL DATA:  Right wrist pain and deformity secondary to a fall today. Recent open reduction and internal fixation  of distal right radius fracture. EXAM: RIGHT WRIST - COMPLETE 3+ VIEW COMPARISON:  Radiographs dated 10/10/2018 FINDINGS: There are new angulated fractures of the distal shafts of the radius and ulna. There is overriding of the distal ulnar fracture. Plate and multiple screws are in place in the distal right radius with callus formation at the prior fracture site. IMPRESSION: 1. New angulated fractures of the distal shafts of the radius and ulna. 2. No change in the appearance of the hardware in the distal right radius. Electronically Signed   By: Lorriane Shire M.D.   On: 12/18/2018 21:11    Procedures Procedures (including critical care time)  SPLINT APPLICATION Date/Time: 123XX123 PM Authorized by: Evalee Jefferson Consent: Verbal consent obtained. Risks and benefits: risks, benefits and alternatives were discussed Consent given by: patient Splint applied by: myself and RN Location details: right forearm Splint type: sugar tong Supplies used: stockinette, webril, fiberglass, ace wraps Post-procedure: The splinted body part was neurovascularly unchanged following the procedure. Patient tolerance: Patient tolerated the procedure well with no immediate complications.   Reduction of dislocation Date/Time: 11:30 PM Performed by: Evalee Jefferson Authorized by: Evalee Jefferson Consent: Verbal consent obtained. Risks and benefits: risks, benefits and alternatives were discussed Consent given by: patient Required items: required blood products, implants, devices, and special equipment available Time out: Immediately prior to procedure  a "time out" was called to verify the correct patient, procedure, equipment, support staff and site/side marked as required.  Patient sedated: etomidate, see Dr. Gloris Manchester note  Vitals: Vital signs were monitored during sedation. Patient tolerance: Patient tolerated the procedure well with no immediate complications. Joint: right radius/ulnar fracture Reduction technique:  traction    Medications Ordered in ED Medications  etomidate (AMIDATE) injection 10 mg (10 mg Intravenous See Procedure Record 12/18/18 2251)  HYDROmorphone (DILAUDID) injection 1 mg (1 mg Intravenous Given 12/18/18 2031)  ondansetron (ZOFRAN) injection 4 mg (4 mg Intravenous Given 12/18/18 2031)  ceFAZolin (ANCEF) IVPB 2g/100 mL premix (0 g Intravenous Stopped 12/18/18 2320)  etomidate (AMIDATE) injection (10 mg Intravenous Given 12/18/18 2223)     Initial Impression / Assessment and Plan / ED Course  I have reviewed the triage vital signs and the nursing notes.  Pertinent labs & imaging results that were available during my care of the patient were reviewed by me and considered in my medical decision making (see chart for details).        Imaging reviewed. Pt with angulated open fracture of distal right forearm involving radius and ulna. Discussed case with Dr. Burney Gauze who is covering for Dr. Fredna Dow this evening.  He had discussion with Dr. Fredna Dow who will be in surgery tomorrow.  Pt will call in am for OV in the am for anticipated day surgery tomorrow for definitive repair of the new injury.  Pt to be NPO.  He was given IV ancef while here given open fracture.  Hydrocodone pre pack given for pain control.  Pt understands plan for tomorrow.  Friend driving pt home.   Final Clinical Impressions(s) / ED Diagnoses   Final diagnoses:  Open fracture of right wrist, initial encounter    ED Discharge Orders    None       Landis Martins 12/19/18 Filiberto Pinks, MD 12/24/18 0830

## 2018-12-19 ENCOUNTER — Ambulatory Visit (HOSPITAL_COMMUNITY): Payer: Medicaid Other | Admitting: Anesthesiology

## 2018-12-19 ENCOUNTER — Ambulatory Visit (HOSPITAL_BASED_OUTPATIENT_CLINIC_OR_DEPARTMENT_OTHER)
Admission: RE | Admit: 2018-12-19 | Discharge: 2018-12-20 | Disposition: A | Discharge status: Home / Self Care | Payer: Medicaid Other | Source: Ambulatory Visit | Attending: Orthopedic Surgery | Admitting: Orthopedic Surgery

## 2018-12-19 ENCOUNTER — Ambulatory Visit (HOSPITAL_COMMUNITY): Payer: Medicaid Other

## 2018-12-19 ENCOUNTER — Encounter (HOSPITAL_COMMUNITY): Payer: Self-pay

## 2018-12-19 ENCOUNTER — Encounter (HOSPITAL_BASED_OUTPATIENT_CLINIC_OR_DEPARTMENT_OTHER): Payer: Self-pay | Admitting: *Deleted

## 2018-12-19 ENCOUNTER — Other Ambulatory Visit: Payer: Self-pay | Admitting: Orthopedic Surgery

## 2018-12-19 ENCOUNTER — Other Ambulatory Visit: Payer: Self-pay

## 2018-12-19 VITALS — BP 132/81 | HR 113 | Temp 96.8°F | Resp 18

## 2018-12-19 DIAGNOSIS — W19XXXA Unspecified fall, initial encounter: Secondary | ICD-10-CM | POA: Insufficient documentation

## 2018-12-19 DIAGNOSIS — Z7901 Long term (current) use of anticoagulants: Secondary | ICD-10-CM | POA: Insufficient documentation

## 2018-12-19 DIAGNOSIS — S52101A Unspecified fracture of upper end of right radius, initial encounter for closed fracture: Secondary | ICD-10-CM | POA: Insufficient documentation

## 2018-12-19 DIAGNOSIS — C188 Malignant neoplasm of overlapping sites of colon: Secondary | ICD-10-CM

## 2018-12-19 DIAGNOSIS — F4310 Post-traumatic stress disorder, unspecified: Secondary | ICD-10-CM | POA: Insufficient documentation

## 2018-12-19 DIAGNOSIS — Z79899 Other long term (current) drug therapy: Secondary | ICD-10-CM | POA: Insufficient documentation

## 2018-12-19 DIAGNOSIS — J449 Chronic obstructive pulmonary disease, unspecified: Secondary | ICD-10-CM | POA: Insufficient documentation

## 2018-12-19 DIAGNOSIS — Z20828 Contact with and (suspected) exposure to other viral communicable diseases: Secondary | ICD-10-CM | POA: Insufficient documentation

## 2018-12-19 DIAGNOSIS — I2510 Atherosclerotic heart disease of native coronary artery without angina pectoris: Secondary | ICD-10-CM | POA: Insufficient documentation

## 2018-12-19 DIAGNOSIS — I10 Essential (primary) hypertension: Secondary | ICD-10-CM | POA: Insufficient documentation

## 2018-12-19 DIAGNOSIS — S52201A Unspecified fracture of shaft of right ulna, initial encounter for closed fracture: Secondary | ICD-10-CM | POA: Insufficient documentation

## 2018-12-19 DIAGNOSIS — C184 Malignant neoplasm of transverse colon: Secondary | ICD-10-CM

## 2018-12-19 DIAGNOSIS — K219 Gastro-esophageal reflux disease without esophagitis: Secondary | ICD-10-CM | POA: Insufficient documentation

## 2018-12-19 DIAGNOSIS — F329 Major depressive disorder, single episode, unspecified: Secondary | ICD-10-CM | POA: Insufficient documentation

## 2018-12-19 DIAGNOSIS — E7800 Pure hypercholesterolemia, unspecified: Secondary | ICD-10-CM | POA: Insufficient documentation

## 2018-12-19 DIAGNOSIS — Z85038 Personal history of other malignant neoplasm of large intestine: Secondary | ICD-10-CM | POA: Insufficient documentation

## 2018-12-19 DIAGNOSIS — Z87891 Personal history of nicotine dependence: Secondary | ICD-10-CM | POA: Insufficient documentation

## 2018-12-19 LAB — SARS CORONAVIRUS 2 (TAT 6-24 HRS): SARS Coronavirus 2: NEGATIVE

## 2018-12-19 MED ORDER — PROPOFOL 10 MG/ML IV BOLUS
INTRAVENOUS | Status: AC
  Filled 2018-12-19: qty 60

## 2018-12-19 MED ORDER — FENTANYL CITRATE (PF) 100 MCG/2ML IJ SOLN
50.0000 ug | INTRAMUSCULAR | Status: DC | PRN
  Administered 2018-12-20: 50 ug via INTRAVENOUS

## 2018-12-19 MED ORDER — SODIUM CHLORIDE 0.9% FLUSH
10.0000 mL | INTRAVENOUS | Status: DC | PRN
  Administered 2018-12-19: 10 mL
  Filled 2018-12-19: qty 10

## 2018-12-19 MED ORDER — LACTATED RINGERS IV SOLN
INTRAVENOUS | Status: DC
  Administered 2018-12-20 (×2): via INTRAVENOUS

## 2018-12-19 MED ORDER — CEFAZOLIN SODIUM-DEXTROSE 2-4 GM/100ML-% IV SOLN
2.0000 g | INTRAVENOUS | Status: AC
  Administered 2018-12-20: 15:00:00 2 g via INTRAVENOUS

## 2018-12-19 MED ORDER — HEPARIN SOD (PORK) LOCK FLUSH 100 UNIT/ML IV SOLN
500.0000 [IU] | Freq: Once | INTRAVENOUS | Status: AC | PRN
  Administered 2018-12-19: 500 [IU]

## 2018-12-19 MED ORDER — CHLORHEXIDINE GLUCONATE 4 % EX LIQD: 60.0000 mL | Freq: Once | CUTANEOUS | Status: DC

## 2018-12-19 MED ORDER — MIDAZOLAM HCL 2 MG/2ML IJ SOLN
1.0000 mg | INTRAMUSCULAR | Status: DC | PRN
  Administered 2018-12-20: 1 mg via INTRAVENOUS

## 2018-12-19 MED ORDER — DOXYCYCLINE HYCLATE 50 MG PO CAPS: 100.0000 mg | ORAL_CAPSULE | Freq: Two times a day (BID) | ORAL | 0 refills | Status: DC

## 2018-12-19 MED FILL — Hydrocodone-Acetaminophen Tab 5-325 MG: ORAL | Qty: 6 | Status: AC

## 2018-12-19 NOTE — Progress Notes (Signed)
Pt here for surgery with chemotherapy agent actively infusing. Surgeon spoke with anesthesia and oncologist and determined to reschedule surgery for tomorrow. Pt will leave here and go directly to cancer center (per Dr Fredna Dow they are expecting him) to have chemo discontinued and return for surgery tomorrow. Pt aware to be here at [facility] tomorrow at 1215. Ivor Reining, made aware of plans and in agreement. Pt aware NPO after MN.

## 2018-12-19 NOTE — H&P (Signed)
Erik [name] is an 63 y.o. male.   Chief Complaint: right wrist fracture HPI: 63 yo male 2 months s/p ORIF right distal radius fracture.  [place] again last night sustaining additional injury to the right wrist.  Seen in ED where XR revealed radius fracture at proximal aspect of the plate with ulnar shaft fracture.  Splinted by ED.  He wishes to proceed with operative fixation.  Allergies: No Known Allergies  Past Medical History:  Diagnosis Date  . Abnormal stress echocardiogram   . Adenocarcinoma of transverse colon ([place]) 02/10/2015  . Alcohol abuse    Heavy Use up until 2010  . Anxiety   . Blood transfusion without reported diagnosis   . Cancer of overlapping sites of colon metastatic to intra-abdominal lymph node ([place]) 12/26/2017  . COPD (chronic obstructive pulmonary disease) ([place])   . Depression   . Emphysema of lung ([place])   . GERD (gastroesophageal reflux disease)   . Head trauma 2001   closed head injury; coma for 4 weeks  . Hypercholesterolemia   . Hypertension   . ING HERN W/GANGREN RECUR UNILAT/UNSPEC ING HERN 04/21/2009   Qualifier: Diagnosis of  By: Verl Blalock, MD, Delanna Ahmadi PTSD (post-traumatic stress disorder)   . Pulmonary nodule 06/14/2016  . SAH (subarachnoid hemorrhage) ([place]) 08/31/2012  . SDH (subdural hematoma) ([place]) 08/31/2012    Past Surgical History:  Procedure Laterality Date  . BIOPSY  01/25/2016   Procedure: BIOPSY;  Surgeon: Danie Binder, MD;  [facility];  Service: Endoscopy;;  ileum;   . cardiac cath    . COLONOSCOPY  2011   Dr. Oneida Alar: multiple adenomas and hyperplastic polyps  . COLONOSCOPY WITH PROPOFOL N/A 01/12/2015   Procedure: COLONOSCOPY WITH PROPOFOL;  Surgeon: Danie Binder, MD;  [facility];  Service: Endoscopy;  Laterality: N/A;  1030  . COLONOSCOPY WITH PROPOFOL N/A 01/25/2016   Procedure: COLONOSCOPY WITH PROPOFOL;  Surgeon: Danie Binder, MD;  [facility];  Service: Endoscopy;  Laterality: N/A;   1230  . CRANIOTOMY  2001  . ESOPHAGOGASTRODUODENOSCOPY (EGD) WITH PROPOFOL N/A 01/12/2015   Procedure: ESOPHAGOGASTRODUODENOSCOPY (EGD) WITH PROPOFOL;  Surgeon: Danie Binder, MD;  [facility];  Service: Endoscopy;  Laterality: N/A;  . head injury surgery    . HERNIA REPAIR Right 2012   Inguinal- [place] Na  . KIDNEY SURGERY     >30 years ago  . LAPAROSCOPIC RIGHT HEMI COLECTOMY Left 02/10/2015   Procedure: LAPAROSCOPIC THEN OPEN RIGHT HEMI COLECTOMY;  Surgeon: [name], MD;  Location: [facility] OR[facility];  Service: General;  Laterality: Left;  . LAPAROTOMY N/A 12/19/2017   Procedure: EXPLORATORY LAPAROTOMY;  Surgeon: Aviva Signs, MD;  Location: [facility];  Service: General;  Laterality: N/A;  . OPEN REDUCTION INTERNAL FIXATION (ORIF) DISTAL RADIAL FRACTURE Right 10/18/2018   Procedure: OPEN REDUCTION INTERNAL FIXATION (ORIF) RIGHT DISTAL RADIAL FRACTURE;  Surgeon: [name], MD;  Location: [place] [facility];  Service: Orthopedics;  Laterality: Right;  block in preop  . POLYPECTOMY  01/25/2016   Procedure: POLYPECTOMY;  Surgeon: Danie Binder, MD;  [facility];  Service: Endoscopy;;  colon  . PORT-A-CATH REMOVAL N/A 02/26/2017   Procedure: MINOR REMOVAL PORT-A-CATH;  Surgeon: Aviva Signs, MD;  [facility];  Service: General;  Laterality: N/A;  Pt to arrive at [place] N/A 03/24/2015   Procedure: INSERTION PORT-A-CATH;  Surgeon: Jules Husbands, MD;  Location[facility]  ORS;  Service: General;  Laterality: N/A;  . PORTACATH PLACEMENT Left 01/07/2018   Procedure: INSERTION PORT A CATH (ATTACHED CATHETER IN LEFT SUBCLAVIAN);  Surgeon: Aviva Signs, MD;  Location: [facility];  Service: General;  Laterality: Left;    Family History: Family History  Problem Relation Age of Onset  . Pulmonary embolism Mother   . Breast cancer Mother   . Cancer Mother        breast cancer  . Arthritis Mother   . Heart disease Father   . Cancer Father 35        Leukemia  . Cancer Paternal Grandfather        Lung  . Ataxia Neg Hx   . Chorea Neg Hx   . Dementia Neg Hx   . Mental retardation Neg Hx   . Migraines Neg Hx   . Multiple sclerosis Neg Hx   . Neurofibromatosis Neg Hx   . Neuropathy Neg Hx   . Parkinsonism Neg Hx   . Seizures Neg Hx   . Stroke Neg Hx   . Colon cancer Neg Hx     Social History:   reports that he quit smoking about a year ago. His smoking use included cigarettes. He started smoking about 45 years ago. He has a 3.00 pack-year smoking history. He has never used smokeless tobacco. He reports current alcohol use. He reports that he does not use drugs.  Medications: Medications Prior to Admission  Medication Sig Dispense Refill  . amitriptyline (ELAVIL) 25 MG tablet Take 1 tablet (25 mg total) by mouth at bedtime. 90 tablet 3  . atorvastatin (LIPITOR) 20 MG tablet TAKE 1 TABLET BY MOUTH AT BEDTIME (Patient taking differently: Take 20 mg by mouth daily. ) 90 tablet 3  . buPROPion (WELLBUTRIN SR) 150 MG 12 hr tablet Take 150 mg by mouth every morning.     . busPIRone (BUSPAR) 10 MG tablet Take 10 mg by mouth 2 (two) times daily.    . fluorouracil CALGB 25956 in sodium chloride 0.9 % 150 mL Inject 5,650 mg into the vein. Over 46 hours    . FLUoxetine (PROZAC) 20 MG capsule TAKE 1 CAPSULE BY MOUTH 2 TIMES A DAY (Patient taking differently: Take 20 mg by mouth 2 (two) times daily. ) 60 capsule 0  . gabapentin (NEURONTIN) 300 MG capsule TAKE 1 CAPSULE BY MOUTH THREE TIMES A DAY 120 capsule 6  . HYDROcodone-acetaminophen (NORCO/VICODIN) 5-325 MG tablet Take 1 tablet by mouth every 6 (six) hours as needed for severe pain. 6 tablet 0  . IRINOTECAN HCL IV Inject 420 mg into the vein every 14 (fourteen) days.    [place] Kitchen LEUCOVORIN CALCIUM IV Inject 944 mg into the vein every 14 (fourteen) days.    [place] Kitchen lidocaine-prilocaine (EMLA) cream APPLY A SMALL AMOUNT OVER PORT SITE AND COVER WITH PLASTIC WRAP ONE HOUR PRIOR TO APPOINTMENT 30 g 0  .  loperamide (IMODIUM) 2 MG capsule TAKE 2 CAPSULES BY MOUTH TWICE DAILY 60 capsule 2  . omeprazole (PRILOSEC) 20 MG capsule Take 20 mg by mouth every morning.     . ondansetron (ZOFRAN) 8 MG tablet Take 1 tablet (8 mg total) by mouth 2 (two) times daily as needed for refractory nausea / vomiting. Start on day 3 after chemotherapy. 30 tablet 1  . oxyCODONE-acetaminophen (PERCOCET/ROXICET) 5-325 MG tablet Take 1-2 tablets by mouth every 6 (six) hours as needed for severe pain. 15 tablet 0  . PROAIR HFA 108 (90 Base) MCG/ACT inhaler  INHALE 2 PUFFS BY MOUTH EVERY 6 HOURS AS NEEDED FOR SHORTNESS OF BREATH/WHEEZING. 18 each 2  . prochlorperazine (COMPAZINE) 10 MG tablet Take 1 tablet (10 mg total) by mouth every 6 (six) hours as needed (NAUSEA). 30 tablet 1  . rivaroxaban (XARELTO) 20 MG TABS tablet Take 1 tablet (20 mg total) by mouth daily with supper. 15 tablet 0  . Vitamin D, Ergocalciferol, (DRISDOL) 1.25 MG (50000 UT) CAPS capsule Take 50,000 Units by mouth once a week.    . WELLBUTRIN XL 150 MG 24 hr tablet Take 150 mg by mouth every morning.      Results for orders placed or performed during the hospital encounter of 12/18/18 (from the past 48 hour(s))  CBC with Differential     Status: Abnormal   Collection Time: 12/18/18  8:38 PM  Result Value Ref Range   WBC 5.6 4.0 - 10.5 K/uL   RBC 4.63 4.22 - 5.81 MIL/uL   Hemoglobin 15.0 13.0 - 17.0 g/dL   HCT 45.5 39.0 - 52.0 %   MCV 98.3 80.0 - 100.0 fL   MCH 32.4 26.0 - 34.0 pg   MCHC 33.0 30.0 - 36.0 g/dL   RDW 15.0 11.5 - 15.5 %   Platelets 227 150 - 400 K/uL   nRBC 0.0 0.0 - 0.2 %   Neutrophils Relative % 89 %   Neutro Abs 5.0 1.7 - 7.7 K/uL   Lymphocytes Relative 8 %   Lymphs Abs 0.5 (L) 0.7 - 4.0 K/uL   Monocytes Relative 3 %   Monocytes Absolute 0.2 0.1 - 1.0 K/uL   Eosinophils Relative 0 %   Eosinophils Absolute 0.0 0.0 - 0.5 K/uL   Basophils Relative 0 %   Basophils Absolute 0.0 0.0 - 0.1 K/uL   Immature Granulocytes 0 %   Abs  Immature Granulocytes 0.01 0.00 - 0.07 K/uL    Comment: Performed at [facility], [address] [place], [place] [id number]  Basic metabolic panel     Status: Abnormal   Collection Time: 12/18/18  8:38 PM  Result Value Ref Range   Sodium 135 135 - 145 mmol/L   Potassium 4.7 3.5 - 5.1 mmol/L   Chloride 98 98 - 111 mmol/L   CO2 24 22 - 32 mmol/L   Glucose, Bld 158 (H) 70 - 99 mg/dL   BUN 14 8 - 23 mg/dL   Creatinine, Ser 0.96 0.61 - 1.24 mg/dL   Calcium 9.5 8.9 - 10.3 mg/dL   GFR calc non Af Amer >60 >60 mL/min   GFR calc Af Amer >60 >60 mL/min   Anion gap 13 5 - 15    Comment: Performed at [facility], [address] [place], [place] [address]  SARS CORONAVIRUS 2 (TAT 6-24 HRS) Nasopharyngeal Nasopharyngeal Swab     Status: None   Collection Time: 12/18/18 10:42 PM   Specimen: Nasopharyngeal Swab  Result Value Ref Range   SARS Coronavirus 2 NEGATIVE NEGATIVE    Comment: (NOTE) SARS-CoV-2 target nucleic acids are NOT DETECTED. The SARS-CoV-2 RNA is generally detectable in upper and lower respiratory specimens during the acute phase of infection. Negative results do not preclude SARS-CoV-2 infection, do not rule out co-infections with other pathogens, and should not be used as the sole basis for treatment or other patient management decisions. Negative results must be combined with clinical observations, patient history, and epidemiological information. The expected result is Negative. Fact Sheet for Patients: SugarRoll.be Fact Sheet for Healthcare Providers: https://www.woods-mathews.com/ This test  is not yet approved or cleared by the [place] and  has been authorized for detection and/or diagnosis of SARS-CoV-2 by FDA under an Emergency Use Authorization (EUA). This EUA will remain  in effect (meaning this test can be used) for the duration of the COVID-19 declaration under Section 56 4(b)(1) of the Act, 21 U.S.C. section  360bbb-3(b)(1), unless the authorization is terminated or revoked sooner. Performed at [facility], [place] [address], [place], [place] [address]     Dg Wrist Complete Right  Result Date: 12/18/2018 CLINICAL DATA:  Status post reduction EXAM: RIGHT WRIST - COMPLETE 3+ VIEW COMPARISON:  Films from earlier in the same day FINDINGS: Splinting material is now seen. There is been interval reduction of the fracture fragments although there remains displacement at the ulnar fracture site of 1 bone with medially involving the distal fracture fragment. The other fracture fragments are in improved alignment. No new fracture is seen. IMPRESSION: Status post reduction with improved alignment of radial fractures although there remains 1 bone with displacement of the distal ulnar fragment. Electronically Signed   By: Inez Catalina M.D.   On: 12/18/2018 22:59   Dg Wrist Complete Right  Result Date: 12/18/2018 CLINICAL DATA:  Right wrist pain and deformity secondary to a fall today. Recent open reduction and internal fixation of distal right radius fracture. EXAM: RIGHT WRIST - COMPLETE 3+ VIEW COMPARISON:  Radiographs dated 10/10/2018 FINDINGS: There are new angulated fractures of the distal shafts of the radius and ulna. There is overriding of the distal ulnar fracture. Plate and multiple screws are in place in the distal right radius with callus formation at the prior fracture site. IMPRESSION: 1. New angulated fractures of the distal shafts of the radius and ulna. 2. No change in the appearance of the hardware in the distal right radius. Electronically Signed   By: Lorriane Shire M.D.   On: 12/18/2018 21:11     A comprehensive review of systems was negative.  There were no vitals taken for this visit.  General appearance: alert, cooperative and appears stated age Head: Normocephalic, without obvious abnormality, atraumatic Neck: supple, symmetrical, trachea midline Cardio: regular rate and  rhythm Resp: clear to auscultation bilaterally Extremities: Intact sensation and capillary refill all digits.  +epl/fpl/io.   Pulses: 2+ and symmetric Skin: Skin color, texture, turgor normal. No rashes or lesions Neurologic: Grossly normal Incision/Wound: volar wrist wound  Assessment/Plan Right radius and ulna fractures s/p previous ORIF right distal radius.  Fracture at proximal end of plate.  Plan operative fixation of radius and likely ulna fracture as well.  Risks, benefits and alternatives of surgery were discussed including risks of blood loss, infection, damage to nerves/vessels/tendons/ligament/bone, failure of surgery, need for additional surgery, complication with wound healing, stiffness, nonunion, malunion.   He voiced understanding of these risks and elected to proceed.    [name] 12/19/2018, 11:43 AM

## 2018-12-19 NOTE — Progress Notes (Signed)
Patient having surgery tomorrow and unable to complete infusion.  Chemo pump to be disconnected per surgeon to have surgery.    Chemotherapy pump disconnected with no complaints voiced.  Patients port flushed without difficulty.  Good blood return noted with no bruising or swelling noted at site.  Band aid applied.  VSS with discharge and left ambulatory with no s/s of distress noted.

## 2018-12-20 ENCOUNTER — Encounter (HOSPITAL_BASED_OUTPATIENT_CLINIC_OR_DEPARTMENT_OTHER)
Admission: RE | Disposition: A | Discharge status: Home / Self Care | Payer: Self-pay | Source: Ambulatory Visit | Attending: Orthopedic Surgery

## 2018-12-20 ENCOUNTER — Encounter (HOSPITAL_BASED_OUTPATIENT_CLINIC_OR_DEPARTMENT_OTHER): Payer: Self-pay

## 2018-12-20 ENCOUNTER — Ambulatory Visit (HOSPITAL_BASED_OUTPATIENT_CLINIC_OR_DEPARTMENT_OTHER): Admission: RE | Admit: 2018-12-20 | Payer: Medicaid Other | Source: Home / Self Care | Admitting: Orthopedic Surgery

## 2018-12-20 ENCOUNTER — Ambulatory Visit (HOSPITAL_BASED_OUTPATIENT_CLINIC_OR_DEPARTMENT_OTHER): Payer: Medicaid Other | Admitting: Anesthesiology

## 2018-12-20 ENCOUNTER — Encounter (HOSPITAL_COMMUNITY): Payer: Medicaid Other

## 2018-12-20 DIAGNOSIS — S52201A Unspecified fracture of shaft of right ulna, initial encounter for closed fracture: Secondary | ICD-10-CM | POA: Diagnosis not present

## 2018-12-20 DIAGNOSIS — Z20828 Contact with and (suspected) exposure to other viral communicable diseases: Secondary | ICD-10-CM | POA: Diagnosis not present

## 2018-12-20 DIAGNOSIS — I10 Essential (primary) hypertension: Secondary | ICD-10-CM | POA: Diagnosis not present

## 2018-12-20 DIAGNOSIS — S52101A Unspecified fracture of upper end of right radius, initial encounter for closed fracture: Secondary | ICD-10-CM | POA: Diagnosis not present

## 2018-12-20 HISTORY — PX: OPEN REDUCTION INTERNAL FIXATION (ORIF) DISTAL RADIAL FRACTURE: SHX5989

## 2018-12-20 SURGERY — OPEN REDUCTION INTERNAL FIXATION (ORIF) DISTAL RADIUS FRACTURE
Anesthesia: Choice | Laterality: Right

## 2018-12-20 SURGERY — OPEN REDUCTION INTERNAL FIXATION (ORIF) DISTAL RADIUS FRACTURE
Anesthesia: Regional | Site: Arm Lower | Laterality: Right

## 2018-12-20 MED ORDER — DEXAMETHASONE SODIUM PHOSPHATE 10 MG/ML IJ SOLN
INTRAMUSCULAR | Status: DC | PRN
  Administered 2018-12-20: 10 mg via INTRAVENOUS

## 2018-12-20 MED ORDER — HYDROCODONE-ACETAMINOPHEN 5-325 MG PO TABS: ORAL_TABLET | ORAL | 0 refills | Status: DC

## 2018-12-20 MED ORDER — ONDANSETRON HCL 4 MG/2ML IJ SOLN
INTRAMUSCULAR | Status: DC | PRN
  Administered 2018-12-20: 4 mg via INTRAVENOUS

## 2018-12-20 MED ORDER — FENTANYL CITRATE (PF) 100 MCG/2ML IJ SOLN
INTRAMUSCULAR | Status: DC | PRN
  Administered 2018-12-20 (×4): 25 ug via INTRAVENOUS

## 2018-12-20 MED ORDER — BUPIVACAINE-EPINEPHRINE (PF) 0.5% -1:200000 IJ SOLN
INTRAMUSCULAR | Status: DC | PRN
  Administered 2018-12-20: 30 mL via PERINEURAL

## 2018-12-20 MED ORDER — ESMOLOL HCL 100 MG/10ML IV SOLN
INTRAVENOUS | Status: DC | PRN
  Administered 2018-12-20 (×5): 20 mg via INTRAVENOUS

## 2018-12-20 MED ORDER — OXYCODONE HCL 5 MG PO TABS: 5.0000 mg | ORAL_TABLET | Freq: Once | ORAL | Status: DC | PRN

## 2018-12-20 MED ORDER — FENTANYL CITRATE (PF) 100 MCG/2ML IJ SOLN: 25.0000 ug | INTRAMUSCULAR | Status: DC | PRN

## 2018-12-20 MED ORDER — PROPOFOL 10 MG/ML IV BOLUS
INTRAVENOUS | Status: DC | PRN
  Administered 2018-12-20 (×2): 30 mg via INTRAVENOUS
  Administered 2018-12-20 (×2): 20 mg via INTRAVENOUS

## 2018-12-20 MED ORDER — PROMETHAZINE HCL 25 MG/ML IJ SOLN: 6.2500 mg | INTRAMUSCULAR | Status: DC | PRN

## 2018-12-20 MED ORDER — OXYCODONE HCL 5 MG/5ML PO SOLN: 5.0000 mg | Freq: Once | ORAL | Status: DC | PRN

## 2018-12-20 MED ORDER — LIDOCAINE HCL (CARDIAC) PF 100 MG/5ML IV SOSY
PREFILLED_SYRINGE | INTRAVENOUS | Status: DC | PRN
  Administered 2018-12-20: 60 mg via INTRAVENOUS

## 2018-12-20 MED ORDER — PROPOFOL 500 MG/50ML IV EMUL
INTRAVENOUS | Status: DC | PRN
  Administered 2018-12-20: 75 ug/kg/min via INTRAVENOUS

## 2018-12-20 SURGICAL SUPPLY — 73 items
BIT DRILL 2.0 LNG QUCK RELEASE (BIT) ×1 IMPLANT
BIT DRILL 2.8 QUICK RELEASE (BIT) ×1 IMPLANT
BIT DRILL 2X3.5 HAND QK RELEAS (BIT) ×1 IMPLANT
BIT DRILL 2X3.5 QUICK RELEASE (BIT) ×3
BLADE SURG 15 STRL LF DISP TIS (BLADE) ×4 IMPLANT
BLADE SURG 15 STRL SS (BLADE) ×8
BNDG ELASTIC 3X5.8 VLCR STR LF (GAUZE/BANDAGES/DRESSINGS) ×3 IMPLANT
BNDG ESMARK 4X9 LF (GAUZE/BANDAGES/DRESSINGS) ×3 IMPLANT
BNDG GAUZE ELAST 4 BULKY (GAUZE/BANDAGES/DRESSINGS) ×3 IMPLANT
BNDG PLASTER X FAST 3X3 WHT LF (CAST SUPPLIES) ×30 IMPLANT
CHLORAPREP W/TINT 26 (MISCELLANEOUS) ×3 IMPLANT
CORD BIPOLAR FORCEPS 12FT (ELECTRODE) ×3 IMPLANT
COVER BACK TABLE REUSABLE LG (DRAPES) ×3 IMPLANT
COVER MAYO STAND REUSABLE (DRAPES) ×3 IMPLANT
COVER WAND RF STERILE (DRAPES) IMPLANT
CUFF TOURN SGL QUICK 18X4 (TOURNIQUET CUFF) ×3 IMPLANT
CUFF TOURN SGL QUICK 24 (TOURNIQUET CUFF)
CUFF TRNQT CYL 24X4X16.5-23 (TOURNIQUET CUFF) IMPLANT
DRAPE EXTREMITY T 121X128X90 (DISPOSABLE) ×3 IMPLANT
DRAPE OEC MINIVIEW 54X84 (DRAPES) ×3 IMPLANT
DRAPE SURG 17X23 STRL (DRAPES) ×3 IMPLANT
DRILL 2.0 LNG QUICK RELEASE (BIT) ×3
DRILL 2.8 QUICK RELEASE (BIT) ×3
GAUZE SPONGE 4X4 12PLY STRL (GAUZE/BANDAGES/DRESSINGS) ×3 IMPLANT
GAUZE SPONGE 4X4 16PLY XRAY LF (GAUZE/BANDAGES/DRESSINGS) ×3 IMPLANT
GAUZE XEROFORM 1X8 LF (GAUZE/BANDAGES/DRESSINGS) ×6 IMPLANT
GLOVE BIO SURGEON STRL SZ7 (GLOVE) ×3 IMPLANT
GLOVE BIO SURGEON STRL SZ7.5 (GLOVE) ×9 IMPLANT
GLOVE BIOGEL PI IND STRL 7.5 (GLOVE) ×1 IMPLANT
GLOVE BIOGEL PI IND STRL 8 (GLOVE) ×1 IMPLANT
GLOVE BIOGEL PI IND STRL 8.5 (GLOVE) IMPLANT
GLOVE BIOGEL PI INDICATOR 7.5 (GLOVE) ×2
GLOVE BIOGEL PI INDICATOR 8 (GLOVE) ×2
GLOVE BIOGEL PI INDICATOR 8.5 (GLOVE)
GLOVE EXAM NITRILE MD LF STRL (GLOVE) ×3 IMPLANT
GLOVE SURG ORTHO 8.0 STRL STRW (GLOVE) IMPLANT
GOWN STRL REUS W/ TWL LRG LVL3 (GOWN DISPOSABLE) ×1 IMPLANT
GOWN STRL REUS W/TWL LRG LVL3 (GOWN DISPOSABLE) ×2
GOWN STRL REUS W/TWL XL LVL3 (GOWN DISPOSABLE) ×6 IMPLANT
GUIDEWIRE ORTHO 0.054X6 (WIRE) ×3 IMPLANT
NEEDLE HYPO 25X1 1.5 SAFETY (NEEDLE) IMPLANT
NS IRRIG 1000ML POUR BTL (IV SOLUTION) ×3 IMPLANT
PACK BASIN DAY SURGERY FS (CUSTOM PROCEDURE TRAY) ×3 IMPLANT
PAD CAST 3X4 CTTN HI CHSV (CAST SUPPLIES) ×1 IMPLANT
PADDING CAST COTTON 3X4 STRL (CAST SUPPLIES) ×2
PLATE 1.3 T-SHAPED (Plate) ×3 IMPLANT
PLATE ACULOC 2 EXTENTION (Plate) ×3 IMPLANT
PLATE ACULOC 2 STANDARD LG R (Plate) ×3 IMPLANT
PLATE ACULOC 2 STD LG R (Plate) ×1 IMPLANT
SCREW BONE LAG 2.3X16MM HEXA (Screw) ×4 IMPLANT
SCREW BONE LOCK 2.3X18MM HEXA (Screw) ×2 IMPLANT
SCREW BONE NL 2.3X14MM HEXA (Screw) ×2 IMPLANT
SCREW CANN THRD 5.5MMX55MM (Screw) ×3 IMPLANT
SCREW CORTICAL LOCKING 2.3X24M (Screw) ×2 IMPLANT
SCREW FX24X2.3XLCK SMTH NS CRT (Screw) ×1 IMPLANT
SCREW HEX 3.5X15 NLCKG STRL (Screw) ×1 IMPLANT
SCREW HEX 3.5X15MM (Screw) ×3 IMPLANT
SCREW HEXALOBE NON-LOCK 3.5X14 (Screw) ×6 IMPLANT
SCREW LAG 2.3X16 (Screw) ×12 IMPLANT
SCREW LOCK 2.3X18 (Screw) ×6 IMPLANT
SCREW NON LOCK 2.3X14 (Screw) ×6 IMPLANT
SCREW NONLOCK 2.3X13MM (Screw) ×3 IMPLANT
SCREW NONLOCK HEX 3.5X12 (Screw) ×6 IMPLANT
SLEEVE SCD COMPRESS KNEE MED (MISCELLANEOUS) IMPLANT
SLING ARM FOAM STRAP XLG (SOFTGOODS) ×3 IMPLANT
STOCKINETTE 4X48 STRL (DRAPES) ×3 IMPLANT
SUT ETHILON 3 0 PS 1 (SUTURE) ×9 IMPLANT
SUT ETHILON 4 0 PS 2 18 (SUTURE) ×3 IMPLANT
SUT VICRYL 4-0 PS2 18IN ABS (SUTURE) ×6 IMPLANT
SYR BULB 3OZ (MISCELLANEOUS) ×3 IMPLANT
SYR CONTROL 10ML LL (SYRINGE) IMPLANT
TOWEL GREEN STERILE FF (TOWEL DISPOSABLE) ×6 IMPLANT
UNDERPAD 30X36 HEAVY ABSORB (UNDERPADS AND DIAPERS) ×3 IMPLANT

## 2018-12-20 NOTE — Anesthesia Procedure Notes (Signed)
Anesthesia Regional Block: Supraclavicular block   Pre-Anesthetic Checklist: ,, timeout performed, Correct Patient, Correct Site, Correct Laterality, Correct Procedure, Correct Position, site marked, Risks and benefits discussed,  Surgical consent,  Pre-op evaluation,  At surgeon's request and post-op pain management  Laterality: Right  Prep: chloraprep       Needles:  Injection technique: Single-shot  Needle Type: Echogenic Needle     Needle Length: 5cm  Needle Gauge: 21     Additional Needles:   Narrative:  Start time: 12/20/2018 1:22 PM End time: 12/20/2018 1:26 PM Injection made incrementally with aspirations every 5 mL.  Performed by: Personally  Anesthesiologist: Audry Pili, MD  Additional Notes: No pain on injection. No increased resistance to injection. Injection made in 5cc increments. Good needle visualization. Patient tolerated the procedure well.

## 2018-12-20 NOTE — Op Note (Signed)
I assisted Surgeon(s) and Role:    * [name], MD - Primary    Daryll Brod, MD - Assisting on the Procedure(s): OPEN REDUCTION INTERNAL FIXATION (ORIF) DISTAL RADIUS AND ULNA FRACTURE on 12/20/2018.  I provided assistance on this case as follows: Set up, approach to the radius with removal of the old plate, identification of the fracture, debridement of the fracture, followed by reduction of fractured radius with stabilization and application of a new plate including the distal radius fracture closure of the wound. Approach to the fracture ulnar identification of the fracture debridement of the fracture reduction of the fracture stabilization of the fracture fixation of the fracture with plate and screws.  Closure of the wound and application of dressings and splints electronically signed by: Daryll Brod, MD Date: 12/20/2018 Time: 5:24 PM

## 2018-12-20 NOTE — Progress Notes (Signed)
Assisted Dr. Brock with right, ultrasound guided, supraclavicular block. Side rails up, monitors on throughout procedure. See vital signs in flow sheet. Tolerated Procedure well. 

## 2018-12-20 NOTE — Anesthesia Preprocedure Evaluation (Addendum)
Anesthesia Evaluation  Patient identified by MRN, date of birth, ID band Patient awake    Reviewed: Allergy & Precautions, NPO status , Patient's Chart, lab work & pertinent test results  History of Anesthesia Complications Negative for: history of anesthetic complications  Airway Mallampati: II  TM Distance: >3 FB Neck ROM: Full    Dental  (+) Dental Advisory Given   Pulmonary COPD, former smoker,    Pulmonary exam normal        Cardiovascular hypertension, + CAD   Rhythm:Regular Rate:Tachycardia     Neuro/Psych PSYCHIATRIC DISORDERS Anxiety Depression  Hx TBI, SDH, SAH      GI/Hepatic GERD  Medicated and Controlled,(+)     substance abuse  alcohol use,  Colon cancer    Endo/Other  negative endocrine ROS  Renal/GU negative Renal ROS     Musculoskeletal negative musculoskeletal ROS (+)   Abdominal   Peds  Hematology negative hematology ROS (+)   Anesthesia Other Findings   Reproductive/Obstetrics                            Anesthesia Physical Anesthesia Plan  ASA: III  Anesthesia Plan: Regional   Post-op Pain Management:    Induction: Intravenous  PONV Risk Score and Plan: 1 and Propofol infusion and Treatment may vary due to age or medical condition  Airway Management Planned: Natural Airway and Simple Face Mask  Additional Equipment: None  Intra-op Plan:   Post-operative Plan:   Informed Consent: I have reviewed the patients History and Physical, chart, labs and discussed the procedure including the risks, benefits and alternatives for the proposed anesthesia with the patient or authorized representative who has indicated his/her understanding and acceptance.       Plan Discussed with: CRNA and Anesthesiologist  Anesthesia Plan Comments:        Anesthesia Quick Evaluation

## 2018-12-20 NOTE — Discharge Instructions (Addendum)

## 2018-12-20 NOTE — Transfer of Care (Signed)
Immediate Anesthesia Transfer of Care Note  Patient: Erik [name]  Procedure(s) Performed: OPEN REDUCTION INTERNAL FIXATION (ORIF) DISTAL RADIUS AND ULNA FRACTURE (Right )  Patient Location: PACU  Anesthesia Type:MAC combined with regional for post-op pain  Level of Consciousness: awake, alert  and oriented  Airway & Oxygen Therapy: Patient Spontanous Breathing and Patient connected to nasal cannula oxygen  Post-op Assessment: Report given to RN and Post -op Vital signs reviewed and stable  Post vital signs: Reviewed and stable  Last Vitals:  Vitals Value Taken Time  BP 127/78 12/20/18 1724  Temp    Pulse 109 12/20/18 1727  Resp 20 12/20/18 1727  SpO2 93 % 12/20/18 1727  Vitals shown include unvalidated device data.  Last Pain:  Vitals:   12/20/18 1327  TempSrc:   PainSc: 5       Patients Stated Pain Goal: 3 (58/30/94 0768)  Complications: No apparent anesthesia complications

## 2018-12-20 NOTE — Interval H&P Note (Signed)
History and Physical Interval Note:  12/20/2018 1:15 PM  Erik [name]  has presented today for surgery, with the diagnosis of FRACTURE RIGHT [place].  The various methods of treatment have been discussed with the patient and family. After consideration of risks, benefits and other options for treatment, the patient has consented to  Procedure(s): OPEN REDUCTION INTERNAL FIXATION (ORIF) DISTAL RADIUS AND ULNA FRACTURE (Right) as a surgical intervention.  The patient's history has been reviewed, patient examined, no change in status, stable for surgery.  I have reviewed the patient's chart and labs.  Questions were answered to the patient's satisfaction.     [name]

## 2018-12-20 NOTE — Op Note (Addendum)
NAME: Erik [name] MEDICAL RECORD NO: [id number] DATE OF BIRTH: 09-Oct-1955 FACILITY: [facility] LOCATION: [facility] PHYSICIAN: Tennis Must, MD   OPERATIVE REPORT   DATE OF PROCEDURE: 12/20/18    PREOPERATIVE DIAGNOSIS:   Right radius fracture through proximal hole of previous hardware and right ulnar shaft fracture   POSTOPERATIVE DIAGNOSIS:   Right radius fracture through proximal hole of previous hardware and right ulnar shaft fracture   PROCEDURE:   1.  Open reduction internal fixation of right radius fracture 2.  Removal hardware right radius 3.  Open reduction internal fixation of right ulnar shaft fracture 4.  Irrigation and debridement of open ulnar shaft fracture   SURGEON:  [name], M.D.   ASSISTANT: Daryll Brod, MD   ANESTHESIA:  Regional with sedation   INTRAVENOUS FLUIDS:  Per anesthesia flow sheet.   ESTIMATED BLOOD LOSS:  Minimal.   COMPLICATIONS:  None.   SPECIMENS:  none   TOURNIQUET TIME:    Total Tourniquet Time Documented: Upper Arm (Right) -128 minutes Total: Upper Arm (Right) -128 minutes    DISPOSITION:  Stable to PACU.   INDICATIONS: 63 year old male 2 months status post open reduction total fixation of comminuted intra-articular distal radius fracture.  He had another fall and sustained fracture of the radius through the proximal hole of the plate.  He also sustained an ulnar shaft fracture.  I recommended removal of previous hardware fixation of the fractures and irrigation and debridement of the ulna fracture as it appeared to have an open component. Risks, benefits and alternatives of surgery were discussed including the risks of blood loss, infection, damage to nerves, vessels, tendons, ligaments, bone for surgery, need for additional surgery, complications with wound healing, continued pain, nonunion, malunion, stiffness.  He voiced understanding of these risks and elected to proceed.  OPERATIVE COURSE:  After being  identified preoperatively by myself,  the patient and I agreed on the procedure and site of the procedure.  The surgical site was marked.  Surgical consent had been signed. He was given IV antibiotics as preoperative antibiotic prophylaxis. He was transferred to the operating room and placed on the operating table in supine position with the Right upper extremity on an arm board.  General anesthesia was induced by the anesthesiologist. A regional block had been performed by anesthesia in preoperative holding.   Right upper extremity was prepped and draped in normal sterile orthopedic fashion.  A surgical pause was performed between the surgeons, anesthesia, and operating room staff and all were in agreement as to the patient, procedure, and site of procedure.  Tourniquet at the proximal aspect of the extremity was inflated to 250 mmHg after exsanguination of the arm with an Esmarch bandage.    Previous volar Mallie Mussel approach was followed.  This incision was extended proximally to aid in reduction placement of hardware.  The FCR was retracted ulnarly.  The scarring over the pronator quadratus was released and the FPL elevated with the periosteal elevator.  The fracture site was identified.  The plate was identified.  The hardware was removed.  Attempt was made to reduce the radial styloid fragment as this was felt to be depressed.  A new Acumed volar distal radial locking plate was placed utilizing the same holes in the shaft of the bone distal to the fracture.  This plate had an extension piece on it allowing fixation proximal to the new fracture.  The holes were all filled with nonlocking screws with standard AO  drilling and measuring technique.  Good purchase was obtained.  The distal holes were then filled with locking pegs with the exception of the styloid holes which were filled with locking screws.  These were redrilled as the plate was slightly different than the previous plate.  C-arm was used in AP lateral  and oblique projections to ensure appropriate reduction position of hardware throughout the case.  Good reduction was obtained.  It was felt that there was some improvement in the depressed radial styloid fragment as well.  The wound was copiously irrigated with sterile saline.  The scar was repaired back over top of the plate as best possible distally.  This was done using 4-0 Vicryl suture.  Inverted interrupted Vicryl sutures were placed in subcutaneous tissues and skin was closed with a combination of 3 and 4-0 nylon suture in a horizontal mattress fashion.  Attention was turned to the distal ulna fracture.  Incision was made distally over the subcutaneous border of the ulna and carried in subcutaneous tissues by spreading technique.  Bipolar electrocautery was used to obtain hemostasis.  Periosteum was incised.  Fracture was identified.  Was cleared of soft tissue and hematoma interposition.  The dorsal sensory branch of the ulnar nerve was identified and protected throughout the case.  The fracture was debrided of any hematoma and soft tissue.  It was then copiously irrigated with sterile saline by bulb syringe.  The fracture was reduced under direct visualization.  There was some comminution.  A an Acumed distal ulna 1.3 thickness T plate was placed.  This is a T-type plate.  Again standard AO drilling and measuring technique was used.  The distalmost holes in the T were filled with locking screws the remainder of the holes were filled with nonlocking screws with the exception of one hole at the fracture site.  Good purchase was obtained.  The C-arm was used in AP and lateral projections to ensure appropriate reduction position of the hardware which was the case.  Good reduction was obtained.  The wound was again copiously irrigated with sterile saline.  The periosteum was repaired back over top of the plate using a 4-0 Vicryl suture in a figure-of-eight fashion.  Inverted interrupted Vicryl sutures were  placed in subcutaneous tissues and the skin was closed with 3-0 nylon in a horizontal mattress fashion.  Pronation supination was checked and was adequate.  The wounds were dressed with sterile Xeroform 4 x 4's and wrapped with a Kerlix bandage.  A sugar tong splint was placed and wrapped with Kerlix and Ace bandage.  The tourniquet was deflated at 128 minutes.  Fingertips were pink with brisk capillary refill after deflation of tourniquet.  The operative  drapes were broken down.  The patient was awoken from anesthesia safely.  He was transferred back to the stretcher and taken to PACU in stable condition.  I will see him back in the office in 1 week for postoperative followup.  I will give him a prescription for Norco 5/325 1-2 tabs PO q6 hours prn pain, dispense # 30.  He is currently on doxycycline 100 mg p.o. twice daily and will continue with this.   [name], MD Electronically signed, 12/20/18

## 2018-12-21 NOTE — Anesthesia Postprocedure Evaluation (Signed)
Anesthesia Post Note  Patient: Erik [name]  Procedure(s) Performed: OPEN REDUCTION INTERNAL FIXATION (ORIF) DISTAL RADIUS AND ULNA FRACTURE (Right Arm Lower)     Patient location during evaluation: PACU Anesthesia Type: Regional Level of consciousness: awake and alert Pain management: pain level controlled Vital Signs Assessment: post-procedure vital signs reviewed and stable Respiratory status: spontaneous breathing, nonlabored ventilation and respiratory function stable Cardiovascular status: stable and blood pressure returned to baseline Anesthetic complications: no    Last Vitals:  Vitals:   12/20/18 1818 12/20/18 1830  BP: 120/72   Pulse: (!) 114 (!) 112  Resp: 18   Temp: 36.8 C   SpO2: 95% 96%    Last Pain:  Vitals:   12/20/18 1818  TempSrc: Oral  PainSc: [place]

## 2018-12-24 ENCOUNTER — Encounter (HOSPITAL_BASED_OUTPATIENT_CLINIC_OR_DEPARTMENT_OTHER): Payer: Self-pay | Admitting: Orthopedic Surgery

## 2019-01-07 ENCOUNTER — Other Ambulatory Visit: Payer: Self-pay

## 2019-01-08 ENCOUNTER — Encounter (HOSPITAL_COMMUNITY): Payer: Self-pay | Admitting: Hematology

## 2019-01-08 ENCOUNTER — Inpatient Hospital Stay (HOSPITAL_COMMUNITY): Payer: Medicaid Other | Attending: Hematology | Admitting: Hematology

## 2019-01-08 ENCOUNTER — Encounter (HOSPITAL_COMMUNITY): Payer: Self-pay

## 2019-01-08 ENCOUNTER — Inpatient Hospital Stay (HOSPITAL_COMMUNITY): Payer: Medicaid Other

## 2019-01-08 VITALS — BP 145/74 | HR 85 | Temp 97.2°F | Resp 18 | Wt 225.4 lb

## 2019-01-08 VITALS — BP 139/77 | HR 80 | Temp 97.7°F | Resp 16

## 2019-01-08 DIAGNOSIS — C188 Malignant neoplasm of overlapping sites of colon: Secondary | ICD-10-CM

## 2019-01-08 DIAGNOSIS — Z5112 Encounter for antineoplastic immunotherapy: Secondary | ICD-10-CM | POA: Insufficient documentation

## 2019-01-08 DIAGNOSIS — Z5111 Encounter for antineoplastic chemotherapy: Secondary | ICD-10-CM | POA: Insufficient documentation

## 2019-01-08 DIAGNOSIS — C786 Secondary malignant neoplasm of retroperitoneum and peritoneum: Secondary | ICD-10-CM | POA: Insufficient documentation

## 2019-01-08 DIAGNOSIS — C184 Malignant neoplasm of transverse colon: Secondary | ICD-10-CM

## 2019-01-08 DIAGNOSIS — C772 Secondary and unspecified malignant neoplasm of intra-abdominal lymph nodes: Secondary | ICD-10-CM | POA: Insufficient documentation

## 2019-01-08 LAB — CBC WITH DIFFERENTIAL/PLATELET
Abs Immature Granulocytes: 0.02 10*3/uL (ref 0.00–0.07)
Basophils Absolute: 0.1 10*3/uL (ref 0.0–0.1)
Basophils Relative: 1 %
Eosinophils Absolute: 0.1 10*3/uL (ref 0.0–0.5)
Eosinophils Relative: 2 %
HCT: 40.9 % (ref 39.0–52.0)
Hemoglobin: 12.9 g/dL — ABNORMAL LOW (ref 13.0–17.0)
Immature Granulocytes: 0 %
Lymphocytes Relative: 16 %
Lymphs Abs: 1.2 10*3/uL (ref 0.7–4.0)
MCH: 31.9 pg (ref 26.0–34.0)
MCHC: 31.5 g/dL (ref 30.0–36.0)
MCV: 101.2 fL — ABNORMAL HIGH (ref 80.0–100.0)
Monocytes Absolute: 0.8 10*3/uL (ref 0.1–1.0)
Monocytes Relative: 11 %
Neutro Abs: 5.4 10*3/uL (ref 1.7–7.7)
Neutrophils Relative %: 70 %
Platelets: 287 10*3/uL (ref 150–400)
RBC: 4.04 MIL/uL — ABNORMAL LOW (ref 4.22–5.81)
RDW: 14.7 % (ref 11.5–15.5)
WBC: 7.7 10*3/uL (ref 4.0–10.5)
nRBC: 0 % (ref 0.0–0.2)

## 2019-01-08 LAB — COMPREHENSIVE METABOLIC PANEL
ALT: 13 U/L (ref 0–44)
AST: 14 U/L — ABNORMAL LOW (ref 15–41)
Albumin: 3.4 g/dL — ABNORMAL LOW (ref 3.5–5.0)
Alkaline Phosphatase: 93 U/L (ref 38–126)
Anion gap: 12 (ref 5–15)
BUN: 9 mg/dL (ref 8–23)
CO2: 25 mmol/L (ref 22–32)
Calcium: 9.3 mg/dL (ref 8.9–10.3)
Chloride: 101 mmol/L (ref 98–111)
Creatinine, Ser: 0.76 mg/dL (ref 0.61–1.24)
GFR calc Af Amer: >60 mL/min (ref >60)
GFR calc non Af Amer: >60 mL/min (ref >60)
Glucose, Bld: 118 mg/dL — ABNORMAL HIGH (ref 70–99)
Potassium: 3.8 mmol/L (ref 3.5–5.1)
Sodium: 138 mmol/L (ref 135–145)
Total Bilirubin: 0.8 mg/dL (ref 0.3–1.2)
Total Protein: 6.8 g/dL (ref 6.5–8.1)

## 2019-01-08 MED ORDER — SODIUM CHLORIDE 0.9 % IV SOLN
10.0000 mg | Freq: Once | INTRAVENOUS | Status: AC
  Administered 2019-01-08: 10 mg via INTRAVENOUS
  Filled 2019-01-08: qty 10

## 2019-01-08 MED ORDER — SODIUM CHLORIDE 0.9 % IV SOLN
900.0000 mg | Freq: Once | INTRAVENOUS | Status: AC
  Administered 2019-01-08: 900 mg via INTRAVENOUS
  Filled 2019-01-08: qty 10

## 2019-01-08 MED ORDER — PALONOSETRON HCL INJECTION 0.25 MG/5ML
0.2500 mg | Freq: Once | INTRAVENOUS | Status: AC
  Administered 2019-01-08: 0.25 mg via INTRAVENOUS

## 2019-01-08 MED ORDER — SODIUM CHLORIDE 0.9% FLUSH
10.0000 mL | INTRAVENOUS | Status: DC | PRN
  Administered 2019-01-08: 10 mL
  Filled 2019-01-08: qty 10

## 2019-01-08 MED ORDER — SODIUM CHLORIDE 0.9 % IV SOLN
4.7000 mg/kg | Freq: Once | INTRAVENOUS | Status: AC
  Administered 2019-01-08: 500 mg via INTRAVENOUS
  Filled 2019-01-08: qty 4

## 2019-01-08 MED ORDER — FLUOROURACIL CHEMO INJECTION 2.5 GM/50ML
400.0000 mg/m2 | Freq: Once | INTRAVENOUS | Status: AC
  Administered 2019-01-08: 950 mg via INTRAVENOUS
  Filled 2019-01-08: qty 19

## 2019-01-08 MED ORDER — SODIUM CHLORIDE 0.9 % IV SOLN
Freq: Once | INTRAVENOUS | Status: AC
  Administered 2019-01-08: 11:00:00 via INTRAVENOUS

## 2019-01-08 MED ORDER — ATROPINE SULFATE 1 MG/ML IJ SOLN
0.5000 mg | Freq: Once | INTRAMUSCULAR | Status: AC
  Administered 2019-01-08: 0.5 mg via INTRAVENOUS
  Filled 2019-01-08: qty 1

## 2019-01-08 MED ORDER — SODIUM CHLORIDE 0.9 % IV SOLN
2400.0000 mg/m2 | INTRAVENOUS | Status: DC
  Administered 2019-01-08: 5650 mg via INTRAVENOUS
  Filled 2019-01-08: qty 50

## 2019-01-08 NOTE — Progress Notes (Signed)
1010 Labs reviewed with and pt seen by Dr. Delton Coombes and pt approved for Avastin,Leucovorin and 5FU infusions today per MD                Erik [name] tolerated chemo tx well without complaints or incident. VSS upon discharge. Pt discharged self ambulatory using his cane in satisfactory condition

## 2019-01-08 NOTE — Patient Instructions (Addendum)
[facility] Discharge Instructions  You were seen today by Dr. Katragadda. He went over your recent lab results. He will see you back in 2 weeks for labs, treatment and follow up.   Thank you for choosing [facility] at [facility] to provide your oncology and hematology care.  To afford each patient quality time with our provider, please arrive at least 15 minutes before your scheduled appointment time.   If you have a lab appointment with the Cancer Center please come in thru the  Main Entrance and check in at the main information desk  You need to re-schedule your appointment should you arrive 10 or more minutes late.  We strive to give you quality time with our providers, and arriving late affects you and other patients whose appointments are after yours.  Also, if you no show three or more times for appointments you may be dismissed from the clinic at the providers discretion.     Again, thank you for choosing [facility].  Our hope is that these requests will decrease the amount of time that you wait before being seen by our physicians.       _____________________________________________________________  Should you have questions after your visit to [facility], please contact our office at [phone] between the hours of 8:00 a.m. and 4:30 p.m.  Voicemails left after 4:00 p.m. will not be returned until the following business day.  For prescription refill requests, have your pharmacy contact our office and allow 72 hours.    Cancer Center Support Programs:   > Cancer Support Group  2nd Tuesday of the month 1pm-2pm, Journey Room    

## 2019-01-08 NOTE — Progress Notes (Signed)
[place], [place] [id number]   CLINIC:  Medical Oncology/Hematology  PCP:  Alanson Puls, [facility] [place] [address] [phone]   REASON FOR VISIT:  Follow-up for metastatic colon cancer   BRIEF ONCOLOGIC HISTORY:  Oncology History Overview Note  Stage IIIC Erik [name]) adenocarcinoma of transverse colon, diagnosed on colonoscopy by Dr. Oneida Alar on 01/12/2015 followed by definitive surgery by Dr. [name] with right hemicolectomy on 02/12/2015.  He then underwent FOLFOX x 12 cycles in the adjuvant setting (03/30/2015- 09/06/2015).   Malignant neoplasm of transverse colon ([place])  01/12/2015 Pathologic Stage   Colon, biopsy, distal transverse - TUBULOVILLOUS ADENOMA WITH HIGH GRADE DYSPLASIA.   01/12/2015 Procedure   Colonoscopy by Dr. Oneida Alar.   01/12/2015 Tumor Marker   CEA: 6.6 (H)    01/18/2015 Imaging   CT abd/pelvis- Apple-core lesion identified in the mid transverse colon without obstruction. No evidence for lymphadenopathy in the gastrohepatic ligament or omentum.  Stable 8 mm hypo attenuating lesion in the left liver, likely a cyst.   02/10/2015 Initial Diagnosis   Adenocarcinoma of transverse colon ([place])   02/12/2015 Definitive Surgery   [name], Extended right hemicolectomy    02/12/2015 Pathology Results   Mucinous adenocarcinoma with penetration of visceral peritoneum, 4/19 lymph nodes for metastatic disease, negative resection margins, with LVI and perineural invasion   03/30/2015 - 09/06/2015 Chemotherapy   FOLFOX x 12 cycles   05/25/2015 Treatment Plan Change   5 FU bolus discontinued for cycle #5   06/08/2015 Treatment Plan Change   Treatment deferred x 1 week   06/15/2015 Treatment Plan Change   5FU CI decreased by 10% and Oxaliplatin reduced by 15% for cycles #6-#11; Oxaliplatin dropped for cycle #12 d/t neuropathy.    10/20/2015 Imaging   CT CAP- Right hemicolectomy without evidence of  metastatic disease. 2. Previously measured ground-glass lesion in the left upper lobe has resolved. 3. Probable food debris in the stomach, simulating gastric wall thickening. Please correlate clinically. 4. 6 mm irregular nodular density in the left upper lobe, stable. Continued attention on followup exams is warranted.   01/25/2016 Procedure   Colonoscopy by Dr. Oneida Alar- Non-thrombosed external hemorrhoids found on digital rectal exam. - One 4 mm polyp in the rectum, removed with a cold biopsy forceps. Resected and retrieved. - Congested mucosa in the neo-terminal ileum. Biopsied. - Redundant colon. - Internal hemorrhoids.   01/26/2016 Pathology Results   1. Terminal ileum, biopsy - MILD ACUTE (ACTIVE) ILEITIS. - NO DYSPLASIA OR MALIGNANCY IDENTIFIED - SEE COMMENT. 2. Rectum, polyp(s) - HYPERPLASTIC POLYP (X 1). - NO DYSPLASIA OR MALIGNANCY IDENTIFIED.   04/13/2016 Imaging   CT chest- Stable CT chest. 6 mm irregular nodule anterior left upper lobe is stable. The scattered areas of peribronchovascular micro nodularity in the lungs bilaterally are unchanged.   10/19/2016 Imaging   CT CAP: 1. Status post right hemicolectomy. No findings to suggest metastatic disease in the abdomen or pelvis. 2. Aortic atherosclerosis. 3. Additional incidental findings, as above. Aortic Atherosclerosis (ICD10-I70.0).   01/09/2018 -  Chemotherapy   The patient had palonosetron (ALOXI) injection 0.25 mg, 0.25 mg, Intravenous,  Once, 24 of 25 cycles Administration: 0.25 mg (01/09/2018), 0.25 mg (01/23/2018), 0.25 mg (02/11/2018), 0.25 mg (02/25/2018), 0.25 mg (03/12/2018), 0.25 mg (03/27/2018), 0.25 mg (04/10/2018), 0.25 mg (04/24/2018), 0.25 mg (05/08/2018), 0.25 mg (05/22/2018), 0.25 mg (06/10/2018), 0.25 mg (06/26/2018), 0.25 mg (07/10/2018), 0.25 mg (07/24/2018), 0.25 mg (08/13/2018), 0.25 mg (08/27/2018),  0.25 mg (09/10/2018), 0.25 mg (09/24/2018), 0.25 mg (10/08/2018), 0.25 mg (11/05/2018), 0.25 mg (11/19/2018), 0.25 mg  (12/04/2018), 0.25 mg (12/18/2018), 0.25 mg (01/08/2019) irinotecan (CAMPTOSAR) 420 mg in dextrose 5 % 500 mL chemo infusion, 180 mg/m2 = 420 mg, Intravenous,  Once, 9 of 9 cycles Administration: 420 mg (01/09/2018), 420 mg (01/23/2018), 420 mg (02/11/2018), 420 mg (02/25/2018), 420 mg (03/12/2018), 420 mg (03/27/2018), 420 mg (04/10/2018), 420 mg (04/24/2018), 420 mg (05/08/2018) leucovorin 900 mg in dextrose 5 % 250 mL infusion, 944 mg, Intravenous,  Once, 24 of 25 cycles Administration: 900 mg (01/09/2018), 900 mg (01/23/2018), 900 mg (02/11/2018), 900 mg (02/25/2018), 900 mg (03/12/2018), 900 mg (03/27/2018), 900 mg (04/10/2018), 900 mg (04/24/2018), 900 mg (05/08/2018), 900 mg (06/10/2018), 900 mg (06/26/2018), 900 mg (07/10/2018), 900 mg (07/24/2018), 900 mg (08/13/2018), 900 mg (08/27/2018), 900 mg (09/10/2018), 900 mg (09/24/2018), 900 mg (10/08/2018), 900 mg (11/05/2018), 900 mg (11/19/2018), 900 mg (12/04/2018), 900 mg (12/18/2018), 900 mg (01/08/2019) fluorouracil (ADRUCIL) chemo injection 950 mg, 400 mg/m2 = 950 mg, Intravenous,  Once, 24 of 25 cycles Administration: 950 mg (01/09/2018), 950 mg (01/23/2018), 950 mg (02/11/2018), 950 mg (02/25/2018), 950 mg (03/12/2018), 950 mg (03/27/2018), 950 mg (04/10/2018), 950 mg (04/24/2018), 950 mg (05/08/2018), 950 mg (05/22/2018), 950 mg (06/10/2018), 950 mg (06/26/2018), 950 mg (07/10/2018), 950 mg (07/24/2018), 950 mg (08/13/2018), 950 mg (08/27/2018), 950 mg (09/10/2018), 950 mg (09/24/2018), 950 mg (10/08/2018), 950 mg (11/05/2018), 950 mg (11/19/2018), 950 mg (12/04/2018), 950 mg (12/18/2018), 950 mg (01/08/2019) fluorouracil (ADRUCIL) 5,650 mg in sodium chloride 0.9 % 137 mL chemo infusion, 2,400 mg/m2 = 5,650 mg, Intravenous, 1 Day/Dose, 24 of 25 cycles Administration: 5,650 mg (01/09/2018), 5,650 mg (01/23/2018), 5,650 mg (02/11/2018), 5,650 mg (02/25/2018), 5,650 mg (03/12/2018), 5,650 mg (03/27/2018), 5,650 mg (04/10/2018), 5,650 mg (04/24/2018), 5,650 mg (05/08/2018), 5,650 mg (05/22/2018), 5,650 mg (06/10/2018), 5,650 mg  (06/26/2018), 5,650 mg (07/10/2018), 5,650 mg (07/24/2018), 5,650 mg (08/13/2018), 5,650 mg (08/27/2018), 5,650 mg (09/10/2018), 5,650 mg (09/24/2018), 5,650 mg (10/08/2018), 5,650 mg (11/05/2018), 5,650 mg (11/19/2018), 5,650 mg (12/04/2018), 5,650 mg (12/18/2018), 5,650 mg (01/08/2019) bevacizumab-awwb (MVASI) 500 mg in sodium chloride 0.9 % 100 mL chemo infusion, 4.7 mg/kg = 525 mg (100 % of original dose 5 mg/kg), Intravenous,  Once, 1 of 1 cycle Dose modification: 5 mg/kg (original dose 5 mg/kg, Cycle 24) Administration: 500 mg (01/08/2019) bevacizumab-bvzr (ZIRABEV) 500 mg in sodium chloride 0.9 % 100 mL chemo infusion, 5 mg/kg = 500 mg (100 % of original dose 5 mg/kg), Intravenous,  Once, 3 of 4 cycles Dose modification: 5 mg/kg (original dose 5 mg/kg, Cycle 21) Administration: 500 mg (11/19/2018), 500 mg (12/04/2018), 500 mg (12/18/2018)  for chemotherapy treatment.    01/23/2018 Cancer Staging   Staging form: Colon and Rectum, AJCC 7th Edition - Pathologic: M1 - Signed by Zoila Shutter, MD on 01/23/2018   Malignant neoplasm of overlapping sites of colon ([place])  12/26/2017 Initial Diagnosis   Cancer of overlapping sites of colon metastatic to intra-abdominal lymph node ([place])   01/09/2018 -  Chemotherapy   The patient had palonosetron (ALOXI) injection 0.25 mg, 0.25 mg, Intravenous,  Once, 13 of 14 cycles Administration: 0.25 mg (01/09/2018), 0.25 mg (01/23/2018), 0.25 mg (02/11/2018), 0.25 mg (02/25/2018), 0.25 mg (03/12/2018), 0.25 mg (03/27/2018), 0.25 mg (04/10/2018), 0.25 mg (04/24/2018), 0.25 mg (05/08/2018), 0.25 mg (05/22/2018), 0.25 mg (06/10/2018), 0.25 mg (06/26/2018), 0.25 mg (07/10/2018) irinotecan (CAMPTOSAR) 420 mg in dextrose 5 % 500 mL chemo infusion, 180 mg/m2 = 420 mg, Intravenous,  Once, 9 of  9 cycles Administration: 420 mg (01/09/2018), 420 mg (01/23/2018), 420 mg (02/11/2018), 420 mg (02/25/2018), 420 mg (03/12/2018), 420 mg (03/27/2018), 420 mg (04/10/2018), 420 mg (04/24/2018), 420 mg (05/08/2018) leucovorin  900 mg in dextrose 5 % 250 mL infusion, 944 mg, Intravenous,  Once, 13 of 14 cycles Administration: 900 mg (01/09/2018), 900 mg (01/23/2018), 900 mg (02/11/2018), 900 mg (02/25/2018), 900 mg (03/12/2018), 900 mg (03/27/2018), 900 mg (04/10/2018), 900 mg (04/24/2018), 900 mg (05/08/2018), 900 mg (06/10/2018), 900 mg (06/26/2018), 900 mg (07/10/2018) fluorouracil (ADRUCIL) chemo injection 950 mg, 400 mg/m2 = 950 mg, Intravenous,  Once, 13 of 14 cycles Administration: 950 mg (01/09/2018), 950 mg (01/23/2018), 950 mg (02/11/2018), 950 mg (02/25/2018), 950 mg (03/12/2018), 950 mg (03/27/2018), 950 mg (04/10/2018), 950 mg (04/24/2018), 950 mg (05/08/2018), 950 mg (05/22/2018), 950 mg (06/10/2018), 950 mg (06/26/2018), 950 mg (07/10/2018) fluorouracil (ADRUCIL) 5,650 mg in sodium chloride 0.9 % 137 mL chemo infusion, 2,400 mg/m2 = 5,650 mg, Intravenous, 1 Day/Dose, 13 of 14 cycles Administration: 5,650 mg (01/09/2018), 5,650 mg (01/23/2018), 5,650 mg (02/11/2018), 5,650 mg (02/25/2018), 5,650 mg (03/12/2018), 5,650 mg (03/27/2018), 5,650 mg (04/10/2018), 5,650 mg (04/24/2018), 5,650 mg (05/08/2018), 5,650 mg (05/22/2018), 5,650 mg (06/10/2018), 5,650 mg (06/26/2018), 5,650 mg (07/10/2018)  for chemotherapy treatment.    Colon cancer metastasized to multiple sites [facility])  01/16/2018 Initial Diagnosis   Colon cancer metastasized to multiple sites [facility])   01/23/2018 - 02/10/2018 Chemotherapy   The patient had panitumumab (VECTIBIX) 600 mg in sodium chloride 0.9 % 100 mL chemo infusion, 640 mg, Intravenous,  Once, 1 of 3 cycles Administration: 600 mg (01/23/2018)  for chemotherapy treatment.    03/12/2018 - 10/21/2018 Chemotherapy   The patient had bevacizumab (AVASTIN) 500 mg in sodium chloride 0.9 % 100 mL chemo infusion, 525 mg, Intravenous,  Once, 15 of 22 cycles Administration: 500 mg (03/12/2018), 500 mg (03/27/2018), 500 mg (04/10/2018), 500 mg (04/24/2018), 500 mg (06/10/2018), 500 mg (05/08/2018), 500 mg (05/22/2018), 500 mg (06/26/2018), 500 mg (07/10/2018), 500 mg  (07/24/2018), 500 mg (08/13/2018), 500 mg (08/27/2018), 500 mg (09/10/2018), 500 mg (09/24/2018), 500 mg (10/08/2018)  for chemotherapy treatment.       CANCER STAGING: Cancer Staging Malignant neoplasm of transverse colon [facility]) Staging form: Colon and Rectum, AJCC 7th Edition - Pathologic stage from 03/04/2015: Stage IIIC (T4a, N2a, cM0) - Signed by Baird Cancer, PA-C on 03/04/2015 - Pathologic: M1 - Signed by Zoila Shutter, MD on 01/23/2018    INTERVAL HISTORY:  Erik [name] 63 y.o. male seen for follow-up of metastatic colon cancer and toxicity assessment.  He has not had a treatment in over a month.  Last maintenance therapy was on 12/18/2018.  He fell at home and broke his right ulna as well as right radius.  He underwent open reduction internal fixation on 12/20/2018 by Dr. Fredna Dow.  X-rays show that the bones are in more alignment.  He is tolerating Xarelto without any bleeding issues.  Peripheral neuropathy from oxaliplatin has been stable.  Diarrhea has been better in the last 4 weeks as he did not receive treatment.  REVIEW OF SYSTEMS:  Review of Systems  Gastrointestinal: Positive for diarrhea and nausea.  Neurological: Positive for numbness.  All other systems reviewed and are negative.    PAST MEDICAL/SURGICAL HISTORY:  Past Medical History:  Diagnosis Date   Abnormal stress echocardiogram    Adenocarcinoma of transverse colon ([place]) 02/10/2015   Alcohol abuse    Heavy Use up until 2010   Anxiety    Blood  transfusion without reported diagnosis    Cancer of overlapping sites of colon metastatic to intra-abdominal lymph node ([place]) 12/26/2017   COPD (chronic obstructive pulmonary disease) ([facility])    Depression    Emphysema of lung ([facility])    GERD (gastroesophageal reflux disease)    Head trauma 2001   closed head injury; coma for 4 weeks   Hypercholesterolemia    Hypertension    ING HERN W/GANGREN RECUR UNILAT/UNSPEC ING HERN 04/21/2009   Qualifier: Diagnosis of  By:  Verl Blalock, MD, [place] [facility]    PTSD (post-traumatic stress disorder)    Pulmonary nodule 06/14/2016   SAH (subarachnoid hemorrhage) ([place]) 08/31/2012   SDH (subdural hematoma) ([place]) 08/31/2012   Past Surgical History:  Procedure Laterality Date   BIOPSY  01/25/2016   Procedure: BIOPSY;  Surgeon: Danie Binder, MD;  [facility];  Service: Endoscopy;;  ileum;    cardiac cath     COLONOSCOPY  2011   Dr. Oneida Alar: multiple adenomas and hyperplastic polyps   COLONOSCOPY WITH PROPOFOL N/A 01/12/2015   Procedure: COLONOSCOPY WITH PROPOFOL;  Surgeon: Danie Binder, MD;  [facility];  Service: Endoscopy;  Laterality: N/A;  1030   COLONOSCOPY WITH PROPOFOL N/A 01/25/2016   Procedure: COLONOSCOPY WITH PROPOFOL;  Surgeon: Danie Binder, MD;  [facility];  Service: Endoscopy;  Laterality: N/A;  [place]   ESOPHAGOGASTRODUODENOSCOPY (EGD) WITH PROPOFOL N/A 01/12/2015   Procedure: ESOPHAGOGASTRODUODENOSCOPY (EGD) WITH PROPOFOL;  Surgeon: Danie Binder, MD;  [facility];  Service: Endoscopy;  Laterality: N/A;   head injury surgery     HERNIA REPAIR Right 2012   Inguinal- [facility]   KIDNEY SURGERY     >30 years ago   LAPAROSCOPIC RIGHT HEMI COLECTOMY Left 02/10/2015   Procedure: LAPAROSCOPIC THEN OPEN RIGHT HEMI COLECTOMY;  Surgeon: [name], MD;  Location: [facility];  Service: General;  Laterality: Left;   LAPAROTOMY N/A 12/19/2017   Procedure: EXPLORATORY LAPAROTOMY;  Surgeon: Aviva Signs, MD;  Location: [facility];  Service: General;  Laterality: N/A;   OPEN REDUCTION INTERNAL FIXATION (ORIF) DISTAL RADIAL FRACTURE Right 10/18/2018   Procedure: OPEN REDUCTION INTERNAL FIXATION (ORIF) RIGHT DISTAL RADIAL FRACTURE;  Surgeon: [name], MD;  Location: [place];  Service: Orthopedics;  Laterality: Right;  block in preop   OPEN REDUCTION INTERNAL FIXATION (ORIF) DISTAL RADIAL FRACTURE Right 12/20/2018    Procedure: OPEN REDUCTION INTERNAL FIXATION (ORIF) DISTAL RADIUS AND ULNA FRACTURE;  Surgeon: [name], MD;  Location: [place];  Service: Orthopedics;  Laterality: Right;  block   POLYPECTOMY  01/25/2016   Procedure: POLYPECTOMY;  Surgeon: Danie Binder, MD;  [facility];  Service: Endoscopy;;  colon   PORT-A-CATH REMOVAL N/A 02/26/2017   Procedure: MINOR REMOVAL PORT-A-CATH;  Surgeon: Aviva Signs, MD;  Location: [facility];  Service: General;  Laterality: N/A;  Pt to arrive at [place] N/A 03/24/2015   Procedure: INSERTION PORT-A-CATH;  Surgeon: Jules Husbands, MD;  Location: [facility];  Service: General;  Laterality: N/A;   PORTACATH PLACEMENT Left 01/07/2018   Procedure: INSERTION PORT A CATH (ATTACHED CATHETER IN LEFT SUBCLAVIAN);  Surgeon: Aviva Signs, MD;  Location: [facility];  Service: General;  Laterality: Left;     SOCIAL HISTORY:  Social History   Socioeconomic History   Marital status: Divorced    Spouse name: Not on file   Number of children:  2   Years of education: 2   Highest education level: Not on file  Occupational History   Occupation: disabled/unemployed   Occupation: unemployed/Mediicaid only    Comment: NO INCOME  Social Designer, fashion/clothing strain: Very hard   Food insecurity    Worry: Often true    Inability: Often true   Transportation needs    Medical: Yes    Non-medical: No  Tobacco Use   Smoking status: Former Smoker    Packs/day: 0.10    Years: 30.00    Pack years: 3.00    Types: Cigarettes    Start date: 02/06/1973    Quit date: 12/09/2017    Years since quitting: 1.0   Smokeless tobacco: Never Used   Tobacco comment: Quit on 12/09/2017  Substance and Sexual Activity   Alcohol use: Yes    Alcohol/week: 0.0 standard drinks    Comment: beer occ, history of ETOH abuse in remote past.    Drug use: No   Sexual activity: Never    Partners: Female    Birth control/protection:  None  Lifestyle   Physical activity    Days per week: Not on file    Minutes per session: Not on file   Stress: Not on file  Relationships   Social connections    Talks on phone: Not on file    Gets together: Not on file    Attends religious service: Not on file    Active member of club or organization: Not on file    Attends meetings of clubs or organizations: Not on file    Relationship status: Not on file   Intimate partner violence    Fear of current or ex partner: Not on file    Emotionally abused: Not on file    Physically abused: Not on file    Forced sexual activity: Not on file  Other Topics Concern   Not on file  Social History Narrative   Single   Lives alone   Turned down for disability    FAMILY HISTORY:  Family History  Problem Relation Age of Onset   Pulmonary embolism Mother    Breast cancer Mother    Cancer Mother        breast cancer   Arthritis Mother    Heart disease Father    Cancer Father 92       Leukemia   Cancer Paternal Grandfather        Lung   Ataxia Neg Hx    Chorea Neg Hx    Dementia Neg Hx    Mental retardation Neg Hx    Migraines Neg Hx    Multiple sclerosis Neg Hx    Neurofibromatosis Neg Hx    Neuropathy Neg Hx    Parkinsonism Neg Hx    Seizures Neg Hx    Stroke Neg Hx    Colon cancer Neg Hx     CURRENT MEDICATIONS:  Outpatient Encounter Medications as of 01/08/2019  Medication Sig Note   amitriptyline (ELAVIL) 25 MG tablet Take 1 tablet (25 mg total) by mouth at bedtime.    atorvastatin (LIPITOR) 20 MG tablet TAKE 1 TABLET BY MOUTH AT BEDTIME (Patient taking differently: Take 20 mg by mouth daily. )    buPROPion (WELLBUTRIN SR) 150 MG 12 hr tablet Take 150 mg by mouth every morning.     busPIRone (BUSPAR) 10 MG tablet Take 10 mg by mouth 2 (two) times daily.    doxycycline (VIBRAMYCIN) 50  MG capsule Take 2 capsules (100 mg total) by mouth 2 (two) times daily.    fluorouracil CALGB 73220 in  sodium chloride 0.9 % 150 mL Inject 5,650 mg into the vein. Over 46 hours    FLUoxetine (PROZAC) 20 MG capsule TAKE 1 CAPSULE BY MOUTH 2 TIMES A DAY (Patient taking differently: Take 20 mg by mouth 2 (two) times daily. ) 06/10/2018: 1 tab TID   gabapentin (NEURONTIN) 300 MG capsule TAKE 1 CAPSULE BY MOUTH THREE TIMES A DAY    HYDROcodone-acetaminophen (NORCO) 5-325 MG tablet 1-2 tabs po q6 hours prn pain    HYDROcodone-acetaminophen (NORCO/VICODIN) 5-325 MG tablet Take 1 tablet by mouth every 6 (six) hours as needed for severe pain.    IRINOTECAN HCL IV Inject 420 mg into the vein every 14 (fourteen) days.    LEUCOVORIN CALCIUM IV Inject 944 mg into the vein every 14 (fourteen) days.    lidocaine-prilocaine (EMLA) cream APPLY A SMALL AMOUNT OVER PORT SITE AND COVER WITH PLASTIC WRAP ONE HOUR PRIOR TO APPOINTMENT    loperamide (IMODIUM) 2 MG capsule TAKE 2 CAPSULES BY MOUTH TWICE DAILY    omeprazole (PRILOSEC) 20 MG capsule Take 20 mg by mouth every morning.     ondansetron (ZOFRAN) 8 MG tablet Take 1 tablet (8 mg total) by mouth 2 (two) times daily as needed for refractory nausea / vomiting. Start on day 3 after chemotherapy.    oxyCODONE-acetaminophen (PERCOCET/ROXICET) 5-325 MG tablet Take 1-2 tablets by mouth every 6 (six) hours as needed for severe pain.    PROAIR HFA 108 (90 Base) MCG/ACT inhaler INHALE 2 PUFFS BY MOUTH EVERY 6 HOURS AS NEEDED FOR SHORTNESS OF BREATH/WHEEZING.    prochlorperazine (COMPAZINE) 10 MG tablet Take 1 tablet (10 mg total) by mouth every 6 (six) hours as needed (NAUSEA).    rivaroxaban (XARELTO) 20 MG TABS tablet Take 1 tablet (20 mg total) by mouth daily with supper.    Vitamin D, Ergocalciferol, (DRISDOL) 1.25 MG (50000 UT) CAPS capsule Take 50,000 Units by mouth once a week.    WELLBUTRIN XL 150 MG 24 hr tablet Take 150 mg by mouth every morning.    Facility-Administered Encounter Medications as of 01/08/2019  Medication   sodium chloride flush (NS)  0.9 % injection 10 mL    ALLERGIES:  No Known Allergies   PHYSICAL EXAM:  ECOG Performance status: 1  Vitals:   01/08/19 0912  BP: (!) 145/74  Pulse: 85  Resp: 18  Temp: (!) 97.2 F (36.2 C)  SpO2: 98%   Filed Weights   01/08/19 0912  Weight: 225 lb 6.4 oz (102.2 kg)    Physical Exam Vitals signs reviewed.  Constitutional:      Appearance: Normal appearance.  Cardiovascular:     Rate and Rhythm: Normal rate and regular rhythm.     Heart sounds: Normal heart sounds.  Pulmonary:     Effort: Pulmonary effort is normal.     Breath sounds: Normal breath sounds.  Abdominal:     General: There is no distension.     Palpations: Abdomen is soft. There is no mass.  Musculoskeletal:        General: No swelling.  Skin:    General: Skin is warm.  Neurological:     General: No focal deficit present.     Mental Status: He is alert and oriented to person, place, and time.  Psychiatric:        Mood and Affect: Mood normal.  Behavior: Behavior normal.      LABORATORY DATA:  I have reviewed the labs as listed.  CBC    Component Value Date/Time   WBC 7.7 01/08/2019 0916   RBC 4.04 (L) 01/08/2019 0916   HGB 12.9 (L) 01/08/2019 0916   HCT 40.9 01/08/2019 0916   PLT 287 01/08/2019 0916   MCV 101.2 (H) 01/08/2019 0916   MCH 31.9 01/08/2019 0916   MCHC 31.5 01/08/2019 0916   RDW 14.7 01/08/2019 0916   LYMPHSABS 1.2 01/08/2019 0916   MONOABS 0.8 01/08/2019 0916   EOSABS 0.1 01/08/2019 0916   BASOSABS 0.1 01/08/2019 0916   CMP Latest Ref Rng & Units 01/08/2019 12/18/2018 12/18/2018  Glucose 70 - 99 mg/dL 118(H) 158(H) 140(H)  BUN 8 - 23 mg/dL '9 14 16  ' Creatinine 0.61 - 1.24 mg/dL 0.76 0.96 0.86  Sodium 135 - 145 mmol/L 138 135 140  Potassium 3.5 - 5.1 mmol/L 3.8 4.7 4.3  Chloride 98 - 111 mmol/L 101 98 100  CO2 22 - 32 mmol/L '25 24 26  ' Calcium 8.9 - 10.3 mg/dL 9.3 9.5 9.5  Total Protein 6.5 - 8.1 g/dL 6.8 - 7.0  Total Bilirubin 0.3 - 1.2 mg/dL 0.8 - 0.9    Alkaline Phos 38 - 126 U/L 93 - 86  AST 15 - 41 U/L 14(L) - 23  ALT 0 - 44 U/L 13 - 15       DIAGNOSTIC IMAGING:  I have independently reviewed the scans and discussed with the patient.   I have reviewed Venita Lick LPN's note and agree with the documentation.  I personally performed a face-to-face visit, made revisions and my assessment and plan is as follows.    ASSESSMENT & PLAN:   Malignant neoplasm of transverse colon ([place]) 1.  Metastatic colon cancer, K-ras mutation positive, MSI stable: -Status post FOLFOX for 12 cycles and adjuvant setting from 03/30/2015 through 09/06/2015. - PET scan on 11/19/2017 showing peritoneal carcinomatosis, right paratracheal lymph node, nonspecific increased uptake in the right hilar region.  CEA was 12.9 on 11/16/2017. -9 cycles of FOLFIRI from 01/09/2018 through 05/08/2018 with addition of bevacizumab during cycle 5. - Maintenance 5-FU/leucovorin and bevacizumab started on 05/22/2018. - He had a fracture of the right wrist with ORIF on 10/18/2018. -CT CAP on 12/03/2018 showed slight interval increase in the nodular metastatic soft tissue along the anterior midline ventral peritoneum, largest nodular deposit component measuring 3.6 x 1.7 cm.  Also interval enlargement of metastatic nodule/lymph nodes in the central anterior abdomen, largest nodule measuring 1 cm, previously 0.5 cm.  Other mesenteric and peritoneal nodules are not significantly changed. -He fell at home again as he slipped on the wet floor.  He broke his right ulna as well as right radius.  He underwent open reduction internal fixation on 12/20/2018.  I have talked to Dr. Fredna Dow prior to surgery. -Latest x-ray showed alignment of the fracture.  He had sutures removed last Friday.  He is still taking some doxycycline given by Dr. Fredna Dow. -Last maintenance therapy was on 12/18/2018. -I have reviewed his blood work.  He may proceed with his treatment today.  I will reevaluate him in 2  weeks.  2.  Pulmonary embolism: -Incidental PE on CT chest on 05/20/2018.  CT angiogram confirmed bilateral PE. -He is on Xarelto and is tolerating it very well.  3.  Peripheral neuropathy: -Pre-existing neuropathy from prior oxaliplatin.  Numbness in the legs have been stable.  4.  Diarrhea: -He will continue Lomotil  as needed.  Total time spent is 25 minutes with more than 50% of the time spent face-to-face discussing treatment plan and coordination of care.    Orders placed this encounter:  Orders Placed This Encounter  Procedures   CBC with Differential/Platelet   Comprehensive metabolic panel   CEA      Derek Jack, MD [place] [phone]

## 2019-01-08 NOTE — Patient Instructions (Signed)
[facility] Discharge Instructions for Patients Receiving Chemotherapy   Beginning January 23rd 2017 lab work for the Cancer Center will be done in the  Main lab at [facility] on 1st floor. If you have a lab appointment with the Cancer Center please come in thru the  Main Entrance and check in at the main information desk   Today you received the following chemotherapy agents Avastin,Leucovorin and 5FU. Follow-up as scheduled. Call clinic for any questions or concerns  To help prevent nausea and vomiting after your treatment, we encourage you to take your nausea medication    If you develop nausea and vomiting, or diarrhea that is not controlled by your medication, call the clinic.  The clinic phone number is [phone]. Office hours are Monday-Friday 8:30am-5:00pm.  BELOW ARE SYMPTOMS THAT SHOULD BE REPORTED IMMEDIATELY:  *FEVER GREATER THAN 101.0 F  *CHILLS WITH OR WITHOUT FEVER  NAUSEA AND VOMITING THAT IS NOT CONTROLLED WITH YOUR NAUSEA MEDICATION  *UNUSUAL SHORTNESS OF BREATH  *UNUSUAL BRUISING OR BLEEDING  TENDERNESS IN MOUTH AND THROAT WITH OR WITHOUT PRESENCE OF ULCERS  *URINARY PROBLEMS  *BOWEL PROBLEMS  UNUSUAL RASH Items with * indicate a potential emergency and should be followed up as soon as possible. If you have an emergency after office hours please contact your primary care physician or go to the nearest emergency department.  Please call the clinic during office hours if you have any questions or concerns.   You may also contact the Patient Navigator at [phone] should you have any questions or need assistance in obtaining follow up care.      Resources For Cancer Patients and their Caregivers ? [employer]: Can assist with transportation, wigs, general needs, runs Look Good Feel Better.        1-888-227-6333 ? Cancer Care: Provides financial assistance, online support groups, medication/co-pay assistance.   1-800-813-HOPE (4673) ? [facility] Assists [place] cancer patients and their families through emotional , educational and financial support.  336-427-4357 ? [place] DSS Where to apply for food stamps, Medicaid and utility assistance. 336-342-1394 ? [facility]: Transportation to medical appointments. 336-347-2287 ? Social Security Administration: May apply for disability if have a Stage IV cancer. 336-342-7796 1-800-772-1213 ? [place] [facility], Disability and Transit Services: Assists with nutrition, care and transit needs. 336-349-2343         

## 2019-01-08 NOTE — Assessment & Plan Note (Addendum)
1.  Metastatic colon cancer, K-ras mutation positive, MSI stable: -Status post FOLFOX for 12 cycles and adjuvant setting from 03/30/2015 through 09/06/2015. - PET scan on 11/19/2017 showing peritoneal carcinomatosis, right paratracheal lymph node, nonspecific increased uptake in the right hilar region.  CEA was 12.9 on 11/16/2017. -9 cycles of FOLFIRI from 01/09/2018 through 05/08/2018 with addition of bevacizumab during cycle 5. - Maintenance 5-FU/leucovorin and bevacizumab started on 05/22/2018. - He had a fracture of the right wrist with ORIF on 10/18/2018. -CT CAP on 12/03/2018 showed slight interval increase in the nodular metastatic soft tissue along the anterior midline ventral peritoneum, largest nodular deposit component measuring 3.6 x 1.7 cm.  Also interval enlargement of metastatic nodule/lymph nodes in the central anterior abdomen, largest nodule measuring 1 cm, previously 0.5 cm.  Other mesenteric and peritoneal nodules are not significantly changed. -He fell at home again as he slipped on the wet floor.  He broke his right ulna as well as right radius.  He underwent open reduction internal fixation on 12/20/2018.  I have talked to Dr. Fredna Dow prior to surgery. -Latest x-ray showed alignment of the fracture.  He had sutures removed last Friday.  He is still taking some doxycycline given by Dr. Fredna Dow. -Last maintenance therapy was on 12/18/2018. -I have reviewed his blood work.  He may proceed with his treatment today.  I will reevaluate him in 2 weeks.  2.  Pulmonary embolism: -Incidental PE on CT chest on 05/20/2018.  CT angiogram confirmed bilateral PE. -He is on Xarelto and is tolerating it very well.  3.  Peripheral neuropathy: -Pre-existing neuropathy from prior oxaliplatin.  Numbness in the legs have been stable.  4.  Diarrhea: -He will continue Lomotil as needed.

## 2019-01-10 ENCOUNTER — Other Ambulatory Visit: Payer: Self-pay

## 2019-01-10 ENCOUNTER — Encounter (HOSPITAL_COMMUNITY): Payer: Self-pay

## 2019-01-10 ENCOUNTER — Inpatient Hospital Stay (HOSPITAL_COMMUNITY): Payer: Medicaid Other

## 2019-01-10 VITALS — BP 147/79 | HR 106 | Temp 97.6°F | Resp 18

## 2019-01-10 DIAGNOSIS — Z5111 Encounter for antineoplastic chemotherapy: Secondary | ICD-10-CM | POA: Diagnosis not present

## 2019-01-10 DIAGNOSIS — C184 Malignant neoplasm of transverse colon: Secondary | ICD-10-CM

## 2019-01-10 DIAGNOSIS — C188 Malignant neoplasm of overlapping sites of colon: Secondary | ICD-10-CM

## 2019-01-10 MED ORDER — HEPARIN SOD (PORK) LOCK FLUSH 100 UNIT/ML IV SOLN
500.0000 [IU] | Freq: Once | INTRAVENOUS | Status: AC | PRN
  Administered 2019-01-10: 500 [IU]

## 2019-01-10 MED ORDER — SODIUM CHLORIDE 0.9% FLUSH
10.0000 mL | INTRAVENOUS | Status: DC | PRN
  Administered 2019-01-10: 10 mL
  Filled 2019-01-10: qty 10

## 2019-01-10 NOTE — Progress Notes (Signed)
Chemo pump disconnect.  Patients port flushed without difficulty.  Good blood return noted with no bruising or swelling noted at site.  Band aid applied.  VSS with discharge and left ambulatory with no s/s of distress noted.  

## 2019-01-12 ENCOUNTER — Encounter (HOSPITAL_COMMUNITY): Payer: Self-pay | Admitting: Hematology

## 2019-01-21 ENCOUNTER — Other Ambulatory Visit (HOSPITAL_COMMUNITY): Payer: Self-pay

## 2019-01-21 DIAGNOSIS — C184 Malignant neoplasm of transverse colon: Secondary | ICD-10-CM

## 2019-01-22 ENCOUNTER — Inpatient Hospital Stay (HOSPITAL_COMMUNITY): Payer: Medicaid Other

## 2019-01-22 ENCOUNTER — Other Ambulatory Visit: Payer: Self-pay

## 2019-01-22 ENCOUNTER — Inpatient Hospital Stay (HOSPITAL_BASED_OUTPATIENT_CLINIC_OR_DEPARTMENT_OTHER): Payer: Medicaid Other | Admitting: Hematology

## 2019-01-22 ENCOUNTER — Encounter (HOSPITAL_COMMUNITY): Payer: Self-pay | Admitting: Hematology

## 2019-01-22 VITALS — BP 126/73 | HR 85 | Temp 97.1°F | Resp 18 | Wt 225.4 lb

## 2019-01-22 VITALS — BP 130/75 | HR 78 | Temp 97.8°F | Resp 18

## 2019-01-22 DIAGNOSIS — C188 Malignant neoplasm of overlapping sites of colon: Secondary | ICD-10-CM

## 2019-01-22 DIAGNOSIS — C184 Malignant neoplasm of transverse colon: Secondary | ICD-10-CM

## 2019-01-22 DIAGNOSIS — Z5111 Encounter for antineoplastic chemotherapy: Secondary | ICD-10-CM | POA: Diagnosis not present

## 2019-01-22 LAB — URINALYSIS, DIPSTICK ONLY
Bilirubin Urine: NEGATIVE
Glucose, UA: NEGATIVE mg/dL
Hgb urine dipstick: NEGATIVE
Ketones, ur: NEGATIVE mg/dL
Leukocytes,Ua: NEGATIVE
Nitrite: NEGATIVE
Protein, ur: NEGATIVE mg/dL
Specific Gravity, Urine: 1.026 (ref 1.005–1.030)
pH: 5 (ref 5.0–8.0)

## 2019-01-22 LAB — COMPREHENSIVE METABOLIC PANEL
ALT: 12 U/L (ref 0–44)
AST: 15 U/L (ref 15–41)
Albumin: 3.5 g/dL (ref 3.5–5.0)
Alkaline Phosphatase: 94 U/L (ref 38–126)
Anion gap: 10 (ref 5–15)
BUN: 14 mg/dL (ref 8–23)
CO2: 27 mmol/L (ref 22–32)
Calcium: 9.2 mg/dL (ref 8.9–10.3)
Chloride: 100 mmol/L (ref 98–111)
Creatinine, Ser: 0.82 mg/dL (ref 0.61–1.24)
GFR calc Af Amer: >60 mL/min (ref >60)
GFR calc non Af Amer: >60 mL/min (ref >60)
Glucose, Bld: 110 mg/dL — ABNORMAL HIGH (ref 70–99)
Potassium: 4.4 mmol/L (ref 3.5–5.1)
Sodium: 137 mmol/L (ref 135–145)
Total Bilirubin: 0.7 mg/dL (ref 0.3–1.2)
Total Protein: 6.8 g/dL (ref 6.5–8.1)

## 2019-01-22 LAB — CBC WITH DIFFERENTIAL/PLATELET
Abs Immature Granulocytes: 0.02 10*3/uL (ref 0.00–0.07)
Basophils Absolute: 0 10*3/uL (ref 0.0–0.1)
Basophils Relative: 1 %
Eosinophils Absolute: 0.2 10*3/uL (ref 0.0–0.5)
Eosinophils Relative: 3 %
HCT: 41.7 % (ref 39.0–52.0)
Hemoglobin: 13.3 g/dL (ref 13.0–17.0)
Immature Granulocytes: 0 %
Lymphocytes Relative: 19 %
Lymphs Abs: 1.3 10*3/uL (ref 0.7–4.0)
MCH: 32.3 pg (ref 26.0–34.0)
MCHC: 31.9 g/dL (ref 30.0–36.0)
MCV: 101.2 fL — ABNORMAL HIGH (ref 80.0–100.0)
Monocytes Absolute: 0.8 10*3/uL (ref 0.1–1.0)
Monocytes Relative: 12 %
Neutro Abs: 4.4 10*3/uL (ref 1.7–7.7)
Neutrophils Relative %: 65 %
Platelets: 176 10*3/uL (ref 150–400)
RBC: 4.12 MIL/uL — ABNORMAL LOW (ref 4.22–5.81)
RDW: 15.4 % (ref 11.5–15.5)
WBC: 6.7 10*3/uL (ref 4.0–10.5)
nRBC: 0 % (ref 0.0–0.2)

## 2019-01-22 MED ORDER — SODIUM CHLORIDE 0.9 % IV SOLN
10.0000 mg | Freq: Once | INTRAVENOUS | Status: AC
  Administered 2019-01-22: 10 mg via INTRAVENOUS
  Filled 2019-01-22: qty 10

## 2019-01-22 MED ORDER — FLUOROURACIL CHEMO INJECTION 2.5 GM/50ML
400.0000 mg/m2 | Freq: Once | INTRAVENOUS | Status: AC
  Administered 2019-01-22: 950 mg via INTRAVENOUS
  Filled 2019-01-22: qty 19

## 2019-01-22 MED ORDER — ATROPINE SULFATE 1 MG/ML IJ SOLN
INTRAMUSCULAR | Status: AC
  Filled 2019-01-22: qty 1

## 2019-01-22 MED ORDER — SODIUM CHLORIDE 0.9 % IV SOLN
900.0000 mg | Freq: Once | INTRAVENOUS | Status: AC
  Administered 2019-01-22: 12:00:00 900 mg via INTRAVENOUS
  Filled 2019-01-22: qty 10

## 2019-01-22 MED ORDER — SODIUM CHLORIDE 0.9 % IV SOLN
5.0000 mg/kg | Freq: Once | INTRAVENOUS | Status: AC
  Administered 2019-01-22: 500 mg via INTRAVENOUS
  Filled 2019-01-22: qty 4

## 2019-01-22 MED ORDER — PALONOSETRON HCL INJECTION 0.25 MG/5ML
0.2500 mg | Freq: Once | INTRAVENOUS | Status: AC
  Administered 2019-01-22: 10:00:00 0.25 mg via INTRAVENOUS

## 2019-01-22 MED ORDER — ATROPINE SULFATE 1 MG/ML IJ SOLN
0.5000 mg | Freq: Once | INTRAMUSCULAR | Status: AC
  Administered 2019-01-22: 0.5 mg via INTRAVENOUS

## 2019-01-22 MED ORDER — PALONOSETRON HCL INJECTION 0.25 MG/5ML
INTRAVENOUS | Status: AC
  Filled 2019-01-22: qty 5

## 2019-01-22 MED ORDER — SODIUM CHLORIDE 0.9 % IV SOLN: Freq: Once | INTRAVENOUS | Status: AC

## 2019-01-22 MED ORDER — SODIUM CHLORIDE 0.9 % IV SOLN
2400.0000 mg/m2 | INTRAVENOUS | Status: DC
  Administered 2019-01-22: 13:00:00 5650 mg via INTRAVENOUS
  Filled 2019-01-22: qty 113

## 2019-01-22 MED ORDER — SODIUM CHLORIDE 0.9% FLUSH
10.0000 mL | INTRAVENOUS | Status: DC | PRN
  Administered 2019-01-22: 10 mL

## 2019-01-22 MED ORDER — HEPARIN SOD (PORK) LOCK FLUSH 100 UNIT/ML IV SOLN: 500.0000 [IU] | Freq: Once | INTRAVENOUS | Status: DC | PRN

## 2019-01-22 NOTE — Patient Instructions (Signed)

## 2019-01-22 NOTE — Patient Instructions (Addendum)
[facility] at [facility] Discharge Instructions  You were seen today by Dr. Katragadda. He went over your recent lab results. He will see you back in 2 weeks for labs and follow up.   Thank you for choosing [facility] at [facility] to provide your oncology and hematology care.  To afford each patient quality time with our provider, please arrive at least 15 minutes before your scheduled appointment time.   If you have a lab appointment with the Cancer Center please come in thru the  Main Entrance and check in at the main information desk  You need to re-schedule your appointment should you arrive 10 or more minutes late.  We strive to give you quality time with our providers, and arriving late affects you and other patients whose appointments are after yours.  Also, if you no show three or more times for appointments you may be dismissed from the clinic at the providers discretion.     Again, thank you for choosing [facility].  Our hope is that these requests will decrease the amount of time that you wait before being seen by our physicians.       _____________________________________________________________  Should you have questions after your visit to [facility], please contact our office at [phone] between the hours of 8:00 a.m. and 4:30 p.m.  Voicemails left after 4:00 p.m. will not be returned until the following business day.  For prescription refill requests, have your pharmacy contact our office and allow 72 hours.    Cancer Center Support Programs:   > Cancer Support Group  2nd Tuesday of the month 1pm-2pm, Journey Room    

## 2019-01-22 NOTE — Addendum Note (Signed)
Addended by: Benjiman Core D on: 01/22/2019 09:40 AM   Modules accepted: Orders

## 2019-01-22 NOTE — Progress Notes (Signed)
Erik [name], [place] [address]   CLINIC:  Medical Oncology/Hematology  PCP:  Alanson Puls, [facility] [place] [address] [phone]   REASON FOR VISIT:  Follow-up for metastatic colon cancer   BRIEF ONCOLOGIC HISTORY:  Oncology History Overview Note  Stage IIIC Erik [name]) adenocarcinoma of transverse colon, diagnosed on colonoscopy by Dr. Oneida Alar on 01/12/2015 followed by definitive surgery by Dr. [name] with right hemicolectomy on 02/12/2015.  He then underwent FOLFOX x 12 cycles in the adjuvant setting (03/30/2015- 09/06/2015).   Malignant neoplasm of transverse colon ([place])  01/12/2015 Pathologic Stage   Colon, biopsy, distal transverse - TUBULOVILLOUS ADENOMA WITH HIGH GRADE DYSPLASIA.   01/12/2015 Procedure   Colonoscopy by Dr. Oneida Alar.   01/12/2015 Tumor Marker   CEA: 6.6 (H)    01/18/2015 Imaging   CT abd/pelvis- Apple-core lesion identified in the mid transverse colon without obstruction. No evidence for lymphadenopathy in the gastrohepatic ligament or omentum.  Stable 8 mm hypo attenuating lesion in the left liver, likely a cyst.   02/10/2015 Initial Diagnosis   Adenocarcinoma of transverse colon ([place])   02/12/2015 Definitive Surgery   [name], Extended right hemicolectomy    02/12/2015 Pathology Results   Mucinous adenocarcinoma with penetration of visceral peritoneum, 4/19 lymph nodes for metastatic disease, negative resection margins, with LVI and perineural invasion   03/30/2015 - 09/06/2015 Chemotherapy   FOLFOX x 12 cycles   05/25/2015 Treatment Plan Change   5 FU bolus discontinued for cycle #5   06/08/2015 Treatment Plan Change   Treatment deferred x 1 week   06/15/2015 Treatment Plan Change   5FU CI decreased by 10% and Oxaliplatin reduced by 15% for cycles #6-#11; Oxaliplatin dropped for cycle #12 d/t neuropathy.    10/20/2015 Imaging   CT CAP- Right hemicolectomy without evidence of  metastatic disease. 2. Previously measured ground-glass lesion in the left upper lobe has resolved. 3. Probable food debris in the stomach, simulating gastric wall thickening. Please correlate clinically. 4. 6 mm irregular nodular density in the left upper lobe, stable. Continued attention on followup exams is warranted.   01/25/2016 Procedure   Colonoscopy by Dr. Oneida Alar- Non-thrombosed external hemorrhoids found on digital rectal exam. - One 4 mm polyp in the rectum, removed with a cold biopsy forceps. Resected and retrieved. - Congested mucosa in the neo-terminal ileum. Biopsied. - Redundant colon. - Internal hemorrhoids.   01/26/2016 Pathology Results   1. Terminal ileum, biopsy - MILD ACUTE (ACTIVE) ILEITIS. - NO DYSPLASIA OR MALIGNANCY IDENTIFIED - SEE COMMENT. 2. Rectum, polyp(s) - HYPERPLASTIC POLYP (X 1). - NO DYSPLASIA OR MALIGNANCY IDENTIFIED.   04/13/2016 Imaging   CT chest- Stable CT chest. 6 mm irregular nodule anterior left upper lobe is stable. The scattered areas of peribronchovascular micro nodularity in the lungs bilaterally are unchanged.   10/19/2016 Imaging   CT CAP: 1. Status post right hemicolectomy. No findings to suggest metastatic disease in the abdomen or pelvis. 2. Aortic atherosclerosis. 3. Additional incidental findings, as above. Aortic Atherosclerosis (ICD10-I70.0).   01/09/2018 -  Chemotherapy   The patient had palonosetron (ALOXI) injection 0.25 mg, 0.25 mg, Intravenous,  Once, 25 of 29 cycles Administration: 0.25 mg (01/09/2018), 0.25 mg (01/23/2018), 0.25 mg (02/11/2018), 0.25 mg (02/25/2018), 0.25 mg (03/12/2018), 0.25 mg (03/27/2018), 0.25 mg (04/10/2018), 0.25 mg (04/24/2018), 0.25 mg (05/08/2018), 0.25 mg (05/22/2018), 0.25 mg (06/10/2018), 0.25 mg (06/26/2018), 0.25 mg (07/10/2018), 0.25 mg (07/24/2018), 0.25 mg (08/13/2018), 0.25 mg (08/27/2018),  0.25 mg (09/10/2018), 0.25 mg (09/24/2018), 0.25 mg (10/08/2018), 0.25 mg (11/05/2018), 0.25 mg (11/19/2018), 0.25 mg  (12/04/2018), 0.25 mg (12/18/2018), 0.25 mg (01/08/2019), 0.25 mg (01/22/2019) irinotecan (CAMPTOSAR) 420 mg in dextrose 5 % 500 mL chemo infusion, 180 mg/m2 = 420 mg, Intravenous,  Once, 9 of 9 cycles Administration: 420 mg (01/09/2018), 420 mg (01/23/2018), 420 mg (02/11/2018), 420 mg (02/25/2018), 420 mg (03/12/2018), 420 mg (03/27/2018), 420 mg (04/10/2018), 420 mg (04/24/2018), 420 mg (05/08/2018) leucovorin 900 mg in dextrose 5 % 250 mL infusion, 944 mg, Intravenous,  Once, 25 of 29 cycles Administration: 900 mg (01/09/2018), 900 mg (01/23/2018), 900 mg (02/11/2018), 900 mg (02/25/2018), 900 mg (03/12/2018), 900 mg (03/27/2018), 900 mg (04/10/2018), 900 mg (04/24/2018), 900 mg (05/08/2018), 900 mg (06/10/2018), 900 mg (06/26/2018), 900 mg (07/10/2018), 900 mg (07/24/2018), 900 mg (08/13/2018), 900 mg (08/27/2018), 900 mg (09/10/2018), 900 mg (09/24/2018), 900 mg (10/08/2018), 900 mg (11/05/2018), 900 mg (11/19/2018), 900 mg (12/04/2018), 900 mg (12/18/2018), 900 mg (01/08/2019), 900 mg (01/22/2019) fluorouracil (ADRUCIL) chemo injection 950 mg, 400 mg/m2 = 950 mg, Intravenous,  Once, 25 of 29 cycles Administration: 950 mg (01/09/2018), 950 mg (01/23/2018), 950 mg (02/11/2018), 950 mg (02/25/2018), 950 mg (03/12/2018), 950 mg (03/27/2018), 950 mg (04/10/2018), 950 mg (04/24/2018), 950 mg (05/08/2018), 950 mg (05/22/2018), 950 mg (06/10/2018), 950 mg (06/26/2018), 950 mg (07/10/2018), 950 mg (07/24/2018), 950 mg (08/13/2018), 950 mg (08/27/2018), 950 mg (09/10/2018), 950 mg (09/24/2018), 950 mg (10/08/2018), 950 mg (11/05/2018), 950 mg (11/19/2018), 950 mg (12/04/2018), 950 mg (12/18/2018), 950 mg (01/08/2019), 950 mg (01/22/2019) fluorouracil (ADRUCIL) 5,650 mg in sodium chloride 0.9 % 137 mL chemo infusion, 2,400 mg/m2 = 5,650 mg, Intravenous, 1 Day/Dose, 25 of 29 cycles Administration: 5,650 mg (01/09/2018), 5,650 mg (01/23/2018), 5,650 mg (02/11/2018), 5,650 mg (02/25/2018), 5,650 mg (03/12/2018), 5,650 mg (03/27/2018), 5,650 mg (04/10/2018), 5,650 mg (04/24/2018), 5,650 mg  (05/08/2018), 5,650 mg (05/22/2018), 5,650 mg (06/10/2018), 5,650 mg (06/26/2018), 5,650 mg (07/10/2018), 5,650 mg (07/24/2018), 5,650 mg (08/13/2018), 5,650 mg (08/27/2018), 5,650 mg (09/10/2018), 5,650 mg (09/24/2018), 5,650 mg (10/08/2018), 5,650 mg (11/05/2018), 5,650 mg (11/19/2018), 5,650 mg (12/04/2018), 5,650 mg (12/18/2018), 5,650 mg (01/08/2019), 5,650 mg (01/22/2019) bevacizumab-awwb (MVASI) 500 mg in sodium chloride 0.9 % 100 mL chemo infusion, 4.7 mg/kg = 525 mg (100 % of original dose 5 mg/kg), Intravenous,  Once, 1 of 1 cycle Dose modification: 5 mg/kg (original dose 5 mg/kg, Cycle 24) Administration: 500 mg (01/08/2019) bevacizumab-bvzr (ZIRABEV) 500 mg in sodium chloride 0.9 % 100 mL chemo infusion, 5 mg/kg = 500 mg (100 % of original dose 5 mg/kg), Intravenous,  Once, 4 of 8 cycles Dose modification: 5 mg/kg (original dose 5 mg/kg, Cycle 21) Administration: 500 mg (11/19/2018), 500 mg (12/04/2018), 500 mg (12/18/2018), 500 mg (01/22/2019)  for chemotherapy treatment.    01/23/2018 Cancer Staging   Staging form: Colon and Rectum, AJCC 7th Edition - Pathologic: M1 - Signed by Zoila Shutter, MD on 01/23/2018   Malignant neoplasm of overlapping sites of colon ([place])  12/26/2017 Initial Diagnosis   Cancer of overlapping sites of colon metastatic to intra-abdominal lymph node ([place])   01/09/2018 -  Chemotherapy   The patient had palonosetron (ALOXI) injection 0.25 mg, 0.25 mg, Intravenous,  Once, 13 of 14 cycles Administration: 0.25 mg (01/09/2018), 0.25 mg (01/23/2018), 0.25 mg (02/11/2018), 0.25 mg (02/25/2018), 0.25 mg (03/12/2018), 0.25 mg (03/27/2018), 0.25 mg (04/10/2018), 0.25 mg (04/24/2018), 0.25 mg (05/08/2018), 0.25 mg (05/22/2018), 0.25 mg (06/10/2018), 0.25 mg (06/26/2018), 0.25 mg (07/10/2018) irinotecan (CAMPTOSAR) 420 mg in dextrose 5 %  500 mL chemo infusion, 180 mg/m2 = 420 mg, Intravenous,  Once, 9 of 9 cycles Administration: 420 mg (01/09/2018), 420 mg (01/23/2018), 420 mg (02/11/2018), 420 mg (02/25/2018),  420 mg (03/12/2018), 420 mg (03/27/2018), 420 mg (04/10/2018), 420 mg (04/24/2018), 420 mg (05/08/2018) leucovorin 900 mg in dextrose 5 % 250 mL infusion, 944 mg, Intravenous,  Once, 13 of 14 cycles Administration: 900 mg (01/09/2018), 900 mg (01/23/2018), 900 mg (02/11/2018), 900 mg (02/25/2018), 900 mg (03/12/2018), 900 mg (03/27/2018), 900 mg (04/10/2018), 900 mg (04/24/2018), 900 mg (05/08/2018), 900 mg (06/10/2018), 900 mg (06/26/2018), 900 mg (07/10/2018) fluorouracil (ADRUCIL) chemo injection 950 mg, 400 mg/m2 = 950 mg, Intravenous,  Once, 13 of 14 cycles Administration: 950 mg (01/09/2018), 950 mg (01/23/2018), 950 mg (02/11/2018), 950 mg (02/25/2018), 950 mg (03/12/2018), 950 mg (03/27/2018), 950 mg (04/10/2018), 950 mg (04/24/2018), 950 mg (05/08/2018), 950 mg (05/22/2018), 950 mg (06/10/2018), 950 mg (06/26/2018), 950 mg (07/10/2018) fluorouracil (ADRUCIL) 5,650 mg in sodium chloride 0.9 % 137 mL chemo infusion, 2,400 mg/m2 = 5,650 mg, Intravenous, 1 Day/Dose, 13 of 14 cycles Administration: 5,650 mg (01/09/2018), 5,650 mg (01/23/2018), 5,650 mg (02/11/2018), 5,650 mg (02/25/2018), 5,650 mg (03/12/2018), 5,650 mg (03/27/2018), 5,650 mg (04/10/2018), 5,650 mg (04/24/2018), 5,650 mg (05/08/2018), 5,650 mg (05/22/2018), 5,650 mg (06/10/2018), 5,650 mg (06/26/2018), 5,650 mg (07/10/2018)  for chemotherapy treatment.    Colon cancer metastasized to multiple sites [facility])  01/16/2018 Initial Diagnosis   Colon cancer metastasized to multiple sites [facility])   01/23/2018 - 02/10/2018 Chemotherapy   The patient had panitumumab (VECTIBIX) 600 mg in sodium chloride 0.9 % 100 mL chemo infusion, 640 mg, Intravenous,  Once, 1 of 3 cycles Administration: 600 mg (01/23/2018)  for chemotherapy treatment.    03/12/2018 - 10/21/2018 Chemotherapy   The patient had bevacizumab (AVASTIN) 500 mg in sodium chloride 0.9 % 100 mL chemo infusion, 525 mg, Intravenous,  Once, 15 of 22 cycles Administration: 500 mg (03/12/2018), 500 mg (03/27/2018), 500 mg (04/10/2018), 500 mg (04/24/2018),  500 mg (06/10/2018), 500 mg (05/08/2018), 500 mg (05/22/2018), 500 mg (06/26/2018), 500 mg (07/10/2018), 500 mg (07/24/2018), 500 mg (08/13/2018), 500 mg (08/27/2018), 500 mg (09/10/2018), 500 mg (09/24/2018), 500 mg (10/08/2018)  for chemotherapy treatment.       CANCER STAGING: Cancer Staging Malignant neoplasm of transverse colon [facility]) Staging form: Colon and Rectum, AJCC 7th Edition - Pathologic stage from 03/04/2015: Stage IIIC (T4a, N2a, cM0) - Signed by Baird Cancer, PA-C on 03/04/2015 - Pathologic: M1 - Signed by Zoila Shutter, MD on 01/23/2018    INTERVAL HISTORY:  Mr. Nin 63 y.o. male seen for follow-up of metastatic colon cancer and toxicity assessment.  He is following up with Dr. Fredna Dow and x-rays show alignment of the fracture.  Appetite is 100%.  Energy levels are 50%.  Denies any major side effects from last chemotherapy.  He continues to have diarrhea for which he takes Lomotil.  Denies any nosebleeds or bleeding per rectum.  He denies any falls.  REVIEW OF SYSTEMS:  Review of Systems  Gastrointestinal: Positive for diarrhea.  Neurological: Positive for numbness.  All other systems reviewed and are negative.    PAST MEDICAL/SURGICAL HISTORY:  Past Medical History:  Diagnosis Date  . Abnormal stress echocardiogram   . Adenocarcinoma of transverse colon ([place]) 02/10/2015  . Alcohol abuse    Heavy Use up until 2010  . Anxiety   . Blood transfusion without reported diagnosis   . Cancer of overlapping sites of colon metastatic to intra-abdominal lymph node ([place])  12/26/2017  . COPD (chronic obstructive pulmonary disease) ([place])   . Depression   . Emphysema of lung ([place])   . GERD (gastroesophageal reflux disease)   . Head trauma 2001   closed head injury; coma for 4 weeks  . Hypercholesterolemia   . Hypertension   . ING HERN W/GANGREN RECUR UNILAT/UNSPEC ING HERN 04/21/2009   Qualifier: Diagnosis of  By: Verl Blalock, MD, Delanna Ahmadi PTSD (post-traumatic stress disorder)   .  Pulmonary nodule 06/14/2016  . SAH (subarachnoid hemorrhage) ([place]) 08/31/2012  . SDH (subdural hematoma) ([place]) 08/31/2012   Past Surgical History:  Procedure Laterality Date  . BIOPSY  01/25/2016   Procedure: BIOPSY;  Surgeon: Danie Binder, MD;  [facility];  Service: Endoscopy;;  ileum;   . cardiac cath    . COLONOSCOPY  2011   Dr. Oneida Alar: multiple adenomas and hyperplastic polyps  . COLONOSCOPY WITH PROPOFOL N/A 01/12/2015   Procedure: COLONOSCOPY WITH PROPOFOL;  Surgeon: Danie Binder, MD;  [facility];  Service: Endoscopy;  Laterality: N/A;  1030  . COLONOSCOPY WITH PROPOFOL N/A 01/25/2016   Procedure: COLONOSCOPY WITH PROPOFOL;  Surgeon: Danie Binder, MD;  [facility];  Service: Endoscopy;  Laterality: N/A;  1230  . CRANIOTOMY  2001  . ESOPHAGOGASTRODUODENOSCOPY (EGD) WITH PROPOFOL N/A 01/12/2015   Procedure: ESOPHAGOGASTRODUODENOSCOPY (EGD) WITH PROPOFOL;  Surgeon: Danie Binder, MD;  [facility];  Service: Endoscopy;  Laterality: N/A;  . head injury surgery    . HERNIA REPAIR Right 2012   Inguinal- Forestine Na  . KIDNEY SURGERY     >30 years ago  . LAPAROSCOPIC RIGHT HEMI COLECTOMY Left 02/10/2015   Procedure: LAPAROSCOPIC THEN OPEN RIGHT HEMI COLECTOMY;  Surgeon: [name], MD;  Location: [facility];  Service: General;  Laterality: Left;  . LAPAROTOMY N/A 12/19/2017   Procedure: EXPLORATORY LAPAROTOMY;  Surgeon: Aviva Signs, MD;  Location: [facility];  Service: General;  Laterality: N/A;  . OPEN REDUCTION INTERNAL FIXATION (ORIF) DISTAL RADIAL FRACTURE Right 10/18/2018   Procedure: OPEN REDUCTION INTERNAL FIXATION (ORIF) RIGHT DISTAL RADIAL FRACTURE;  Surgeon: [name], MD;  Location: [place];  Service: Orthopedics;  Laterality: Right;  block in preop  . OPEN REDUCTION INTERNAL FIXATION (ORIF) DISTAL RADIAL FRACTURE Right 12/20/2018   Procedure: OPEN REDUCTION INTERNAL FIXATION (ORIF) DISTAL RADIUS AND ULNA  FRACTURE;  Surgeon: [name], MD;  Location: [place];  Service: Orthopedics;  Laterality: Right;  block  . POLYPECTOMY  01/25/2016   Procedure: POLYPECTOMY;  Surgeon: Danie Binder, MD;  [facility];  Service: Endoscopy;;  colon  . PORT-A-CATH REMOVAL N/A 02/26/2017   Procedure: MINOR REMOVAL PORT-A-CATH;  Surgeon: Aviva Signs, MD;  [facility];  Service: General;  Laterality: N/A;  Pt to arrive at [place] N/A 03/24/2015   Procedure: INSERTION PORT-A-CATH;  Surgeon: Jules Husbands, MD;  Location: [facility];  Service: General;  Laterality: N/A;  . PORTACATH PLACEMENT Left 01/07/2018   Procedure: INSERTION PORT A CATH (ATTACHED CATHETER IN LEFT SUBCLAVIAN);  Surgeon: Aviva Signs, MD;  Location: [facility];  Service: General;  Laterality: Left;     SOCIAL HISTORY:  Social History   Socioeconomic History  . Marital status: Divorced    Spouse name: Not on file  . Number of children: 2  . Years of education: 10  . Highest education level: Not on file  Occupational History  .  Occupation: disabled/unemployed  . Occupation: unemployed/Mediicaid only    Comment: NO INCOME  Tobacco Use  . Smoking status: Former Smoker    Packs/day: 0.10    Years: 30.00    Pack years: 3.00    Types: Cigarettes    Start date: 02/06/1973    Quit date: 12/09/2017    Years since quitting: 1.1  . Smokeless tobacco: Never Used  . Tobacco comment: Quit on 12/09/2017  Substance and Sexual Activity  . Alcohol use: Yes    Alcohol/week: 0.0 standard drinks    Comment: beer occ, history of ETOH abuse in remote past.   . Drug use: No  . Sexual activity: Never    Partners: Female    Birth control/protection: None  Other Topics Concern  . Not on file  Social History Narrative   Single   Lives alone   Turned down for disability   Social Determinants of Health   Financial Resource Strain: High Risk  . Difficulty of Paying Living Expenses: Very hard  Food  Insecurity: Food Insecurity Present  . Worried About Charity fundraiser in the Last Year: Often true  . Ran Out of Food in the Last Year: Often true  Transportation Needs: Unmet Transportation Needs  . Lack of Transportation (Medical): Yes  . Lack of Transportation (Non-Medical): No  Physical Activity:   . Days of Exercise per Week: Not on file  . Minutes of Exercise per Session: Not on file  Stress:   . Feeling of Stress : Not on file  Social Connections:   . Frequency of Communication with Friends and Family: Not on file  . Frequency of Social Gatherings with Friends and Family: Not on file  . Attends Religious Services: Not on file  . Active Member of Clubs or Organizations: Not on file  . Attends Archivist Meetings: Not on file  . Marital Status: Not on file  Intimate Partner Violence:   . Fear of Current or Ex-Partner: Not on file  . Emotionally Abused: Not on file  . Physically Abused: Not on file  . Sexually Abused: Not on file    FAMILY HISTORY:  Family History  Problem Relation Age of Onset  . Pulmonary embolism Mother   . Breast cancer Mother   . Cancer Mother        breast cancer  . Arthritis Mother   . Heart disease Father   . Cancer Father 16       Leukemia  . Cancer Paternal Grandfather        Lung  . Ataxia Neg Hx   . Chorea Neg Hx   . Dementia Neg Hx   . Mental retardation Neg Hx   . Migraines Neg Hx   . Multiple sclerosis Neg Hx   . Neurofibromatosis Neg Hx   . Neuropathy Neg Hx   . Parkinsonism Neg Hx   . Seizures Neg Hx   . Stroke Neg Hx   . Colon cancer Neg Hx     CURRENT MEDICATIONS:  Outpatient Encounter Medications as of 01/22/2019  Medication Sig Note  . amitriptyline (ELAVIL) 25 MG tablet Take 1 tablet (25 mg total) by mouth at bedtime.   Marland Kitchen atorvastatin (LIPITOR) 20 MG tablet TAKE 1 TABLET BY MOUTH AT BEDTIME (Patient taking differently: Take 20 mg by mouth daily. )   . buPROPion (WELLBUTRIN SR) 150 MG 12 hr tablet Take 150  mg by mouth every morning.    . busPIRone (BUSPAR) 10 MG  tablet Take 10 mg by mouth 2 (two) times daily.   Marland Kitchen doxycycline (VIBRAMYCIN) 50 MG capsule Take 2 capsules (100 mg total) by mouth 2 (two) times daily.   . fluorouracil CALGB 94765 in sodium chloride 0.9 % 150 mL Inject 5,650 mg into the vein. Over 46 hours   . FLUoxetine (PROZAC) 20 MG capsule TAKE 1 CAPSULE BY MOUTH 2 TIMES A DAY (Patient taking differently: Take 20 mg by mouth 2 (two) times daily. ) 06/10/2018: 1 tab TID  . gabapentin (NEURONTIN) 300 MG capsule TAKE 1 CAPSULE BY MOUTH THREE TIMES A DAY   . IRINOTECAN HCL IV Inject 420 mg into the vein every 14 (fourteen) days.   Marland Kitchen LEUCOVORIN CALCIUM IV Inject 944 mg into the vein every 14 (fourteen) days.   Marland Kitchen lidocaine-prilocaine (EMLA) cream APPLY A SMALL AMOUNT OVER PORT SITE AND COVER WITH PLASTIC WRAP ONE HOUR PRIOR TO APPOINTMENT   . loperamide (IMODIUM) 2 MG capsule TAKE 2 CAPSULES BY MOUTH TWICE DAILY   . omeprazole (PRILOSEC) 20 MG capsule Take 20 mg by mouth every morning.    . ondansetron (ZOFRAN) 8 MG tablet Take 1 tablet (8 mg total) by mouth 2 (two) times daily as needed for refractory nausea / vomiting. Start on day 3 after chemotherapy.   Marland Kitchen PROAIR HFA 108 (90 Base) MCG/ACT inhaler INHALE 2 PUFFS BY MOUTH EVERY 6 HOURS AS NEEDED FOR SHORTNESS OF BREATH/WHEEZING.   Marland Kitchen prochlorperazine (COMPAZINE) 10 MG tablet Take 1 tablet (10 mg total) by mouth every 6 (six) hours as needed (NAUSEA).   . rivaroxaban (XARELTO) 20 MG TABS tablet Take 1 tablet (20 mg total) by mouth daily with supper.   . Vitamin D, Ergocalciferol, (DRISDOL) 1.25 MG (50000 UT) CAPS capsule Take 50,000 Units by mouth once a week.   . WELLBUTRIN XL 150 MG 24 hr tablet Take 150 mg by mouth every morning.   . [DISCONTINUED] doxycycline (VIBRA-TABS) 100 MG tablet Take 100 mg by mouth 2 (two) times daily.   . [DISCONTINUED] HYDROcodone-acetaminophen (NORCO) 5-325 MG tablet 1-2 tabs po q6 hours prn pain   .  [DISCONTINUED] HYDROcodone-acetaminophen (NORCO/VICODIN) 5-325 MG tablet Take 1 tablet by mouth every 6 (six) hours as needed for severe pain.   . [DISCONTINUED] oxyCODONE-acetaminophen (PERCOCET/ROXICET) 5-325 MG tablet Take 1-2 tablets by mouth every 6 (six) hours as needed for severe pain.    Facility-Administered Encounter Medications as of 01/22/2019  Medication  . sodium chloride flush (NS) 0.9 % injection 10 mL    ALLERGIES:  No Known Allergies   PHYSICAL EXAM:  ECOG Performance status: 1  Vitals:   01/22/19 0840  BP: 126/73  Pulse: 85  Resp: 18  Temp: (!) 97.1 F (36.2 C)  SpO2: 97%   Filed Weights   01/22/19 0840  Weight: 225 lb 6.4 oz (102.2 kg)    Physical Exam Vitals reviewed.  Constitutional:      Appearance: Normal appearance.  Cardiovascular:     Rate and Rhythm: Normal rate and regular rhythm.     Heart sounds: Normal heart sounds.  Pulmonary:     Effort: Pulmonary effort is normal.     Breath sounds: Normal breath sounds.  Abdominal:     General: There is no distension.     Palpations: Abdomen is soft. There is no mass.  Musculoskeletal:        General: No swelling.  Skin:    General: Skin is warm.  Neurological:     General: No focal  deficit present.     Mental Status: He is alert and oriented to person, place, and time.  Psychiatric:        Mood and Affect: Mood normal.        Behavior: Behavior normal.      LABORATORY DATA:  I have reviewed the labs as listed.  CBC    Component Value Date/Time   WBC 6.7 01/22/2019 0827   RBC 4.12 (L) 01/22/2019 0827   HGB 13.3 01/22/2019 0827   HCT 41.7 01/22/2019 0827   PLT 176 01/22/2019 0827   MCV 101.2 (H) 01/22/2019 0827   MCH 32.3 01/22/2019 0827   MCHC 31.9 01/22/2019 0827   RDW 15.4 01/22/2019 0827   LYMPHSABS 1.3 01/22/2019 0827   MONOABS 0.8 01/22/2019 0827   EOSABS 0.2 01/22/2019 0827   BASOSABS 0.0 01/22/2019 0827   CMP Latest Ref Rng & Units 01/22/2019 01/08/2019 12/18/2018    Glucose 70 - 99 mg/dL 110(H) 118(H) 158(H)  BUN 8 - 23 mg/dL '14 9 14  ' Creatinine 0.61 - 1.24 mg/dL 0.82 0.76 0.96  Sodium 135 - 145 mmol/L 137 138 135  Potassium 3.5 - 5.1 mmol/L 4.4 3.8 4.7  Chloride 98 - 111 mmol/L 100 101 98  CO2 22 - 32 mmol/L '27 25 24  ' Calcium 8.9 - 10.3 mg/dL 9.2 9.3 9.5  Total Protein 6.5 - 8.1 g/dL 6.8 6.8 -  Total Bilirubin 0.3 - 1.2 mg/dL 0.7 0.8 -  Alkaline Phos 38 - 126 U/L 94 93 -  AST 15 - 41 U/L 15 14(L) -  ALT 0 - 44 U/L 12 13 -       DIAGNOSTIC IMAGING:  I have independently reviewed the scans and discussed with the patient.   I have reviewed Venita Lick LPN's note and agree with the documentation.  I personally performed a face-to-face visit, made revisions and my assessment and plan is as follows.    ASSESSMENT & PLAN:   Malignant neoplasm of transverse colon ([place]) 1.  Metastatic colon cancer, K-ras mutation positive, MSI stable: -Status post FOLFOX for 12 cycles and adjuvant setting from 03/30/2015 through 09/06/2015. - PET scan on 11/19/2017 showing peritoneal carcinomatosis, right paratracheal lymph node, nonspecific increased uptake in the right hilar region.  CEA was 12.9 on 11/16/2017. -9 cycles of FOLFIRI from 01/09/2018 through 05/08/2018 with addition of bevacizumab during cycle 5. - Maintenance 5-FU/leucovorin and bevacizumab started on 05/22/2018. - He had a fracture of the right wrist with ORIF on 10/18/2018. -CT CAP on 12/03/2018 showed slight interval increase in the nodular metastatic soft tissue along the anterior midline ventral peritoneum, largest nodular deposit component measuring 3.6 x 1.7 cm.  Also interval enlargement of metastatic nodule/lymph nodes in the central anterior abdomen, largest nodule measuring 1 cm, previously 0.5 cm.  Other mesenteric and peritoneal nodules are not significantly changed. -He fell at home again as he slipped on the wet floor.  He broke his right ulna as well as right radius.  He underwent open  reduction internal fixation on 12/20/2018.  -He follows up with Dr. Fredna Dow.  Latest x-ray showed alignment of the fracture. -We have reviewed his labs today.  He will proceed with his next maintenance treatment today.  I would not change any doses at this time. -He will come back in 2 weeks for follow-up.  We will plan to repeat scans in 3 months.  2.  Pulmonary embolism: -Incidental PE on CT chest on 05/20/2018.  CT angiogram confirmed bilateral PE. -He  is on Xarelto and is tolerating it very well.  3.  Peripheral neuropathy: -Pre-existing neuropathy from prior oxaliplatin.  Numbness in the legs has been stable.  4.  Diarrhea: -He was told to take Lomotil as needed.     Orders placed this encounter:  Orders Placed This Encounter  Procedures  . CBC with Differential/Platelet  . Comprehensive metabolic panel      Derek Jack, MD [place] [phone]

## 2019-01-22 NOTE — Progress Notes (Signed)
Patient presents today for treatment and follow up visit with Dr. Delton Coombes. Patient has no complaints of pain today. Vital signs within parameters for treatment. Labs pending. Patient states he has noticed his face peeling occasionally. Upon assessment no rash noted. Patient states it started two days ago. Patient instructed to inform Dr. Delton Coombes.   Treatment given today per MD orders. Tolerated infusion without adverse affects. Vital signs stable. 5FU pump infusing per protocol. RUN noted on screen.  No complaints at this time. Discharged from clinic ambulatory. F/U with [facility] as scheduled.

## 2019-01-23 LAB — CEA: CEA: 9.2 ng/mL — ABNORMAL HIGH (ref 0.0–4.7)

## 2019-01-24 ENCOUNTER — Other Ambulatory Visit: Payer: Self-pay

## 2019-01-24 ENCOUNTER — Inpatient Hospital Stay (HOSPITAL_COMMUNITY): Payer: Medicaid Other

## 2019-01-24 VITALS — BP 139/73 | HR 93 | Temp 97.7°F | Resp 18

## 2019-01-24 DIAGNOSIS — C188 Malignant neoplasm of overlapping sites of colon: Secondary | ICD-10-CM

## 2019-01-24 DIAGNOSIS — C184 Malignant neoplasm of transverse colon: Secondary | ICD-10-CM

## 2019-01-24 DIAGNOSIS — Z5111 Encounter for antineoplastic chemotherapy: Secondary | ICD-10-CM | POA: Diagnosis not present

## 2019-01-24 MED ORDER — HEPARIN SOD (PORK) LOCK FLUSH 100 UNIT/ML IV SOLN
500.0000 [IU] | Freq: Once | INTRAVENOUS | Status: AC | PRN
  Administered 2019-01-24: 12:00:00 500 [IU]

## 2019-01-24 MED ORDER — SODIUM CHLORIDE 0.9% FLUSH
10.0000 mL | INTRAVENOUS | Status: DC | PRN
  Administered 2019-01-24: 12:00:00 10 mL

## 2019-01-24 NOTE — Progress Notes (Signed)
Josepha Pigg presented for pump d/c and port flush.  Portacath located in the left chest wall. Clean, dry and intact.  Portacath flushed with 41ml NS and 500U/82ml Heparin per protocol and needle removed intact. Blood return noted.  Procedure without incident.  Vital signs stable. No complaints at this time. Discharged from clinic ambulatory. F/U with [facility] as scheduled.

## 2019-01-24 NOTE — Patient Instructions (Signed)
[facility] Discharge Instructions for Patients Receiving Chemotherapy  Today you received the following chemotherapy agents   To help prevent nausea and vomiting after your treatment, we encourage you to take your nausea medication   If you develop nausea and vomiting that is not controlled by your nausea medication, call the clinic.   BELOW ARE SYMPTOMS THAT SHOULD BE REPORTED IMMEDIATELY:  *FEVER GREATER THAN 100.5 F  *CHILLS WITH OR WITHOUT FEVER  NAUSEA AND VOMITING THAT IS NOT CONTROLLED WITH YOUR NAUSEA MEDICATION  *UNUSUAL SHORTNESS OF BREATH  *UNUSUAL BRUISING OR BLEEDING  TENDERNESS IN MOUTH AND THROAT WITH OR WITHOUT PRESENCE OF ULCERS  *URINARY PROBLEMS  *BOWEL PROBLEMS  UNUSUAL RASH Items with * indicate a potential emergency and should be followed up as soon as possible.  Feel free to call the clinic should you have any questions or concerns. The clinic phone number is [phone].  Please show the CHEMO ALERT CARD at check-in to the Emergency Department and triage nurse.   

## 2019-01-25 ENCOUNTER — Emergency Department (HOSPITAL_COMMUNITY)
Admission: EM | Admit: 2019-01-25 | Discharge: 2019-01-26 | Disposition: A | Discharge status: Home / Self Care | Payer: Medicaid Other | Attending: Emergency Medicine | Admitting: Emergency Medicine

## 2019-01-25 ENCOUNTER — Encounter (HOSPITAL_COMMUNITY): Payer: Self-pay | Admitting: Emergency Medicine

## 2019-01-25 ENCOUNTER — Other Ambulatory Visit: Payer: Self-pay

## 2019-01-25 ENCOUNTER — Emergency Department (HOSPITAL_COMMUNITY): Payer: Medicaid Other

## 2019-01-25 DIAGNOSIS — S0101XA Laceration without foreign body of scalp, initial encounter: Secondary | ICD-10-CM | POA: Insufficient documentation

## 2019-01-25 DIAGNOSIS — I10 Essential (primary) hypertension: Secondary | ICD-10-CM | POA: Insufficient documentation

## 2019-01-25 DIAGNOSIS — Y929 Unspecified place or not applicable: Secondary | ICD-10-CM | POA: Diagnosis not present

## 2019-01-25 DIAGNOSIS — W19XXXA Unspecified fall, initial encounter: Secondary | ICD-10-CM | POA: Insufficient documentation

## 2019-01-25 DIAGNOSIS — Z79899 Other long term (current) drug therapy: Secondary | ICD-10-CM | POA: Diagnosis not present

## 2019-01-25 DIAGNOSIS — Y999 Unspecified external cause status: Secondary | ICD-10-CM | POA: Diagnosis not present

## 2019-01-25 DIAGNOSIS — J449 Chronic obstructive pulmonary disease, unspecified: Secondary | ICD-10-CM | POA: Diagnosis not present

## 2019-01-25 DIAGNOSIS — I2510 Atherosclerotic heart disease of native coronary artery without angina pectoris: Secondary | ICD-10-CM | POA: Diagnosis not present

## 2019-01-25 DIAGNOSIS — Y939 Activity, unspecified: Secondary | ICD-10-CM | POA: Diagnosis not present

## 2019-01-25 DIAGNOSIS — Z87891 Personal history of nicotine dependence: Secondary | ICD-10-CM | POA: Diagnosis not present

## 2019-01-25 DIAGNOSIS — S0990XA Unspecified injury of head, initial encounter: Secondary | ICD-10-CM

## 2019-01-25 MED ORDER — LIDOCAINE-EPINEPHRINE (PF) 1 %-1:200000 IJ SOLN
30.0000 mL | Freq: Once | INTRAMUSCULAR | Status: AC
  Administered 2019-01-25: 30 mL
  Filled 2019-01-25: qty 30

## 2019-01-25 NOTE — Discharge Instructions (Addendum)
Suture out in 10 days.  Keep initial dressing on for 2 to 3 days, then can change daily.  If dressing sticks your skin, apply a warm moist pack.  You may have a headache.  Tylenol for pain.

## 2019-01-25 NOTE — ED Triage Notes (Signed)
Pt had fall tonight. EMS reports pt is intoxicated. Pt hit head and reports LOC. Pt alert and oriented x 3

## 2019-01-25 NOTE — ED Provider Notes (Addendum)
[facility] EMERGENCY DEPARTMENT Provider Note   CSN: [id number] Arrival date & time: 01/25/19  2031     History Chief Complaint  Patient presents with  . Fall    Erik [name] is a 63 y.o. male.  Level 5 caveat for alcohol intoxication.  Patient allegedly fell and hit his head this evening.  Uncertain LOC.  He now has a large laceration on his left frontal temporal scalp.  No obvious extremity or pelvic pain.        Past Medical History:  Diagnosis Date  . Abnormal stress echocardiogram   . Adenocarcinoma of transverse colon ([place]) 02/10/2015  . Alcohol abuse    Heavy Use up until 2010  . Anxiety   . Blood transfusion without reported diagnosis   . Cancer of overlapping sites of colon metastatic to intra-abdominal lymph node ([place]) 12/26/2017  . COPD (chronic obstructive pulmonary disease) ([place])   . Depression   . Emphysema of lung ([place])   . GERD (gastroesophageal reflux disease)   . Head trauma 2001   closed head injury; coma for 4 weeks  . Hypercholesterolemia   . Hypertension   . ING HERN W/GANGREN RECUR UNILAT/UNSPEC ING HERN 04/21/2009   Qualifier: Diagnosis of  By: Verl Blalock, MD, Delanna Ahmadi PTSD (post-traumatic stress disorder)   . Pulmonary nodule 06/14/2016  . SAH (subarachnoid hemorrhage) ([place]) 08/31/2012  . SDH (subdural hematoma) ([place]) 08/31/2012    Patient Active Problem List   Diagnosis Date Noted  . Colon cancer metastasized to multiple sites ([place]) 01/16/2018  . Malignant neoplasm of overlapping sites of colon ([place]) 12/26/2017  . S/P exploratory laparotomy 12/19/2017  . Carcinomatosis ([place])   . Psoriasis 09/12/2016  . Pulmonary nodule 06/14/2016  . History of colon cancer   . Diarrhea 01/06/2016  . Cigarette nicotine dependence with nicotine-induced disorder 06/18/2015  . Chronic obstructive pulmonary disease ([place]) 06/18/2015  . Depression 06/18/2015  . B12 deficiency 04/14/2015  . Malignant neoplasm of transverse colon ([place]) 02/10/2015  .  GERD (gastroesophageal reflux disease) 12/15/2014  . History of colonic polyps 12/15/2014  . History of alcohol abuse 09/16/2014  . Panic disorder 10/10/2012  . Major depressive disorder, single episode 10/10/2012  . Bilateral lower extremity edema 04/24/2012  . Hyperlipidemia 04/21/2009  . CAD, NATIVE VESSEL 03/25/2009  . HEAD TRAUMA 03/02/2009    Past Surgical History:  Procedure Laterality Date  . BIOPSY  01/25/2016   Procedure: BIOPSY;  Surgeon: Danie Binder, MD;  [facility];  Service: Endoscopy;;  ileum;   . cardiac cath    . COLONOSCOPY  2011   Dr. Oneida Alar: multiple adenomas and hyperplastic polyps  . COLONOSCOPY WITH PROPOFOL N/A 01/12/2015   Procedure: COLONOSCOPY WITH PROPOFOL;  Surgeon: Danie Binder, MD;  [facility];  Service: Endoscopy;  Laterality: N/A;  1030  . COLONOSCOPY WITH PROPOFOL N/A 01/25/2016   Procedure: COLONOSCOPY WITH PROPOFOL;  Surgeon: Danie Binder, MD;  [facility];  Service: Endoscopy;  Laterality: N/A;  1230  . CRANIOTOMY  2001  . ESOPHAGOGASTRODUODENOSCOPY (EGD) WITH PROPOFOL N/A 01/12/2015   Procedure: ESOPHAGOGASTRODUODENOSCOPY (EGD) WITH PROPOFOL;  Surgeon: Danie Binder, MD;  [facility];  Service: Endoscopy;  Laterality: N/A;  . head injury surgery    . HERNIA REPAIR Right 2012   Inguinal- Forestine Na  . KIDNEY SURGERY     >30 years ago  . LAPAROSCOPIC RIGHT HEMI COLECTOMY Left 02/10/2015   Procedure: LAPAROSCOPIC  THEN OPEN RIGHT HEMI COLECTOMY;  Surgeon: [name], MD;  Location: [facility] OR[facility];  Service: General;  Laterality: Left;  . LAPAROTOMY N/A 12/19/2017   Procedure: EXPLORATORY LAPAROTOMY;  Surgeon: Aviva Signs, MD;  Location: [facility];  Service: General;  Laterality: N/A;  . OPEN REDUCTION INTERNAL FIXATION (ORIF) DISTAL RADIAL FRACTURE Right 10/18/2018   Procedure: OPEN REDUCTION INTERNAL FIXATION (ORIF) RIGHT DISTAL RADIAL FRACTURE;  Surgeon: [name], MD;  Location: [place];  Service: Orthopedics;  Laterality: Right;  block in preop  . OPEN REDUCTION INTERNAL FIXATION (ORIF) DISTAL RADIAL FRACTURE Right 12/20/2018   Procedure: OPEN REDUCTION INTERNAL FIXATION (ORIF) DISTAL RADIUS AND ULNA FRACTURE;  Surgeon: [name], MD;  Location: [place];  Service: Orthopedics;  Laterality: Right;  block  . POLYPECTOMY  01/25/2016   Procedure: POLYPECTOMY;  Surgeon: Danie Binder, MD;  [facility];  Service: Endoscopy;;  colon  . PORT-A-CATH REMOVAL N/A 02/26/2017   Procedure: MINOR REMOVAL PORT-A-CATH;  Surgeon: Aviva Signs, MD;  Location: [facility];  Service: General;  Laterality: N/A;  Pt to arrive at [place] N/A 03/24/2015   Procedure: INSERTION PORT-A-CATH;  Surgeon: Jules Husbands, MD;  Location: [facility];  Service: General;  Laterality: N/A;  . PORTACATH PLACEMENT Left 01/07/2018   Procedure: INSERTION PORT A CATH (ATTACHED CATHETER IN LEFT SUBCLAVIAN);  Surgeon: Aviva Signs, MD;  Location: [facility];  Service: General;  Laterality: Left;       Family History  Problem Relation Age of Onset  . Pulmonary embolism Mother   . Breast cancer Mother   . Cancer Mother        breast cancer  . Arthritis Mother   . Heart disease Father   . Cancer Father 75       Leukemia  . Cancer Paternal Grandfather        Lung  . Ataxia Neg Hx   . Chorea Neg Hx   . Dementia Neg Hx   . Mental retardation Neg Hx   . Migraines Neg Hx   . Multiple sclerosis Neg Hx   . Neurofibromatosis Neg Hx   . Neuropathy Neg Hx   . Parkinsonism Neg Hx   . Seizures Neg Hx   . Stroke Neg Hx   . Colon cancer Neg Hx     Social History   Tobacco Use  . Smoking status: Former Smoker    Packs/day: 0.10    Years: 30.00    Pack years: 3.00    Types: Cigarettes    Start date: 02/06/1973    Quit date: 12/09/2017    Years since quitting: 1.1  . Smokeless tobacco: Never Used  . Tobacco comment: Quit on 12/09/2017  Substance Use  Topics  . Alcohol use: Yes    Alcohol/week: 0.0 standard drinks    Comment: beer occ, history of ETOH abuse in remote past.   . Drug use: No    Home Medications Prior to Admission medications   Medication Sig Start Date End Date Taking? Authorizing Provider  doxycycline (VIBRAMYCIN) 50 MG capsule Take 2 capsules (100 mg total) by mouth 2 (two) times daily. 12/19/18  Yes [name], MD  Vitamin D, Ergocalciferol, (DRISDOL) 1.25 MG (50000 UT) CAPS capsule Take 50,000 Units by mouth once a week. 12/05/18  Yes [provider]  WELLBUTRIN XL 150 MG 24 hr tablet Take 150 mg by mouth every morning. 11/20/18  Yes [provider]  amitriptyline (ELAVIL)  25 MG tablet Take 1 tablet (25 mg total) by mouth at bedtime. 08/08/18   Tomi Likens, Adam R, DO  atorvastatin (LIPITOR) 20 MG tablet TAKE 1 TABLET BY MOUTH AT BEDTIME Patient taking differently: Take 20 mg by mouth daily.  05/29/18   Derek Jack, MD  buPROPion (WELLBUTRIN SR) 150 MG 12 hr tablet Take 150 mg by mouth every morning.     [provider]  busPIRone (BUSPAR) 10 MG tablet Take 10 mg by mouth 2 (two) times daily.    [provider]  fluorouracil CALGB 60454 in sodium chloride 0.9 % 150 mL Inject 5,650 mg into the vein. Over 46 hours    [provider]  FLUoxetine (PROZAC) 20 MG capsule TAKE 1 CAPSULE BY MOUTH 2 TIMES A DAY Patient taking differently: Take 20 mg by mouth 2 (two) times daily.  06/14/16   [name], PA-C  gabapentin (NEURONTIN) 300 MG capsule TAKE 1 CAPSULE BY MOUTH THREE TIMES A DAY 07/04/18   Derek Jack, MD  IRINOTECAN HCL IV Inject 420 mg into the vein every 14 (fourteen) days.    [provider]  LEUCOVORIN CALCIUM IV Inject 944 mg into the vein every 14 (fourteen) days.    [provider]  lidocaine-prilocaine (EMLA) cream APPLY A SMALL AMOUNT OVER PORT SITE AND COVER WITH PLASTIC WRAP ONE HOUR PRIOR TO APPOINTMENT 11/04/18   Derek Jack, MD  loperamide (IMODIUM) 2 MG capsule TAKE 2 CAPSULES BY MOUTH TWICE DAILY 11/04/18   Derek Jack, MD  omeprazole (PRILOSEC) 20 MG capsule Take 20 mg by mouth every morning.     [provider]  ondansetron (ZOFRAN) 8 MG tablet Take 1 tablet (8 mg total) by mouth 2 (two) times daily as needed for refractory nausea / vomiting. Start on day 3 after chemotherapy. 12/27/17   Higgs, Mathis Dad, MD  PROAIR HFA 108 (90 Base) MCG/ACT inhaler INHALE 2 PUFFS BY MOUTH EVERY 6 HOURS AS NEEDED FOR SHORTNESS OF BREATH/WHEEZING. 02/13/17   Raylene Everts, MD  prochlorperazine (COMPAZINE) 10 MG tablet Take 1 tablet (10 mg total) by mouth every 6 (six) hours as needed (NAUSEA). 12/27/17   Higgs, Mathis Dad, MD  rivaroxaban (XARELTO) 20 MG TABS tablet Take 1 tablet (20 mg total) by mouth daily with supper. 06/26/18   Derek Jack, MD    Allergies    Patient has no known allergies.  Review of Systems   Review of Systems  Unable to perform ROS: Other    Physical Exam Updated Vital Signs BP 127/72 (BP Location: Right Arm)   Pulse 86   Temp 98 F (36.7 C) (Oral)   Resp 16   SpO2 96%   Physical Exam Vitals and nursing note reviewed.  Constitutional:      Appearance: He is well-developed.     Comments: Intoxicated, answers questions appropriately  HENT:     Head: Normocephalic.     Comments: 9.0 cm laceration on left frontal temporal scalp Eyes:     Conjunctiva/sclera: Conjunctivae normal.  Cardiovascular:     Rate and Rhythm: Normal rate and regular rhythm.  Pulmonary:     Effort: Pulmonary effort is normal.     Breath sounds: Normal breath sounds.  Abdominal:     General: Bowel sounds are normal.     Palpations: Abdomen is soft.  Musculoskeletal:        General: Normal range of motion.     Cervical back: Neck supple.  Skin:    General: Skin is  warm and dry.  Neurological:     General: No focal deficit present.     Comments: Moves arms and legs.  Psychiatric:      Comments: Intoxicated     ED Results / Procedures / Treatments   Labs (all labs ordered are listed, but only abnormal results are displayed) Labs Reviewed - No data to display  EKG None  Radiology CT Head Wo Contrast  Result Date: 01/25/2019 CLINICAL DATA:  Fall tonight with laceration to left frontotemporal scalp. EXAM: CT HEAD WITHOUT CONTRAST TECHNIQUE: Contiguous axial images were obtained from the base of the skull through the vertex without intravenous contrast. COMPARISON:  Head CT 10/10/2018 FINDINGS: Brain: Unchanged encephalomalacia in the right temporal, right inferior frontal, and to a lesser extent left inferior frontal lobes. No acute intracranial hemorrhage. No subdural or extra-axial collection. No evidence of acute ischemia or hydrocephalus. No midline shift or mass effect. Vascular: No hyperdense vessel. Skull: No fracture or focal lesion. Sinuses/Orbits: Chronic mucosal thickening of right maxillary sinus. No acute findings. The mastoid air cells are clear. Other: Left temporal frontal scalp laceration. IMPRESSION: 1. Left temporal frontal scalp laceration. No skull fracture or acute intracranial abnormality. 2. Unchanged encephalomalacia in the right temporal, right inferior frontal, and to a lesser extent left inferior frontal lobes. Electronically Signed   By: Keith Rake M.D.   On: 01/25/2019 22:22   CT Cervical Spine Wo Contrast  Result Date: 01/25/2019 CLINICAL DATA:  Fall tonight. Laceration to head. EXAM: CT CERVICAL SPINE WITHOUT CONTRAST TECHNIQUE: Multidetector CT imaging of the cervical spine was performed without intravenous contrast. Multiplanar CT image reconstructions were also generated. COMPARISON:  CT cervical spine 08/15/2014 FINDINGS: Alignment: Minimal anterolisthesis of C2 on C3 is new from prior exam. Facets are normally aligned. Skull base and vertebrae: No acute fracture. Vertebral body heights are maintained. The dens and skull base are  intact. Soft tissues and spinal canal: No prevertebral fluid or swelling. No visible canal hematoma. Disc levels: Diffuse disc space narrowing and endplate spurring from D34-534 through C6-C7. Multilevel facet hypertrophy. Bilateral neural foraminal stenosis, slightly progressed from prior exam at multiple levels. Upper chest: No acute findings. Other: Carotid calcifications. IMPRESSION: 1. No acute fracture of the cervical spine. 2. Minimal anterolisthesis of C2 on C3, this is likely degenerative, however increased from 2016. There is clinical concern for ligamentous injury, recommend cervical spine MRI. 3. Additional multilevel degenerative disc disease and facet arthropathy, slightly progressed from prior exam. Electronically Signed   By: Keith Rake M.D.   On: 01/25/2019 22:28    Procedures .Marland KitchenLaceration Repair  Date/Time: 01/25/2019 11:12 PM Performed by: Nat Christen, MD Authorized by: Nat Christen, MD   Consent:    Consent obtained:  Verbal   Consent given by:  Patient   Risks discussed:  Pain and poor wound healing Anesthesia (see MAR for exact dosages):    Anesthesia method:  Local infiltration   Local anesthetic:  Lidocaine 1% WITH epi Laceration details:    Location:  Scalp   Scalp location:  L temporal   Wound length (cm): 9.0. Repair type:    Repair type:  Intermediate Pre-procedure details:    Preparation:  Patient was prepped and draped in usual sterile fashion Exploration:    Hemostasis achieved with:  Epinephrine   Wound exploration: wound explored through full range of motion     Contaminated: no   Treatment:    Area cleansed with:  Saline   Amount of cleaning:  Extensive  Irrigation solution:  Sterile saline   Irrigation method:  Syringe   Visualized foreign bodies/material removed: no   Skin repair:    Repair method:  Sutures   Suture size:  4-0   Suture material:  Prolene   Number of sutures: 7. Approximation:    Approximation:  Loose Post-procedure  details:    Dressing:  Bulky dressing   Patient tolerance of procedure:  Tolerated well, no immediate complications   (including critical care time)  Medications Ordered in ED Medications  lidocaine-EPINEPHrine (XYLOCAINE-EPINEPHrine) 1 %-1:200000 (PF) injection 30 mL (has no administration in time range)    ED Course  I have reviewed the triage vital signs and the nursing notes.  Pertinent labs & imaging results that were available during my care of the patient were reviewed by me and considered in my medical decision making (see chart for details).    MDM Rules/Calculators/A&P                      We will obtain CT head and CT cervical spine.  Then repair laceration.  2300: Surgical repair by examiner without complications.  Patient will be observed for another hour or 2 so he can sober up. Final Clinical Impression(s) / ED Diagnoses Final diagnoses:  Fall, initial encounter  Minor head injury, initial encounter  Laceration of scalp, initial encounter    Rx / DC Orders ED Discharge Orders    None       Nat Christen, MD 01/25/19 2058    Nat Christen, MD 01/25/19 2314

## 2019-01-25 NOTE — ED Notes (Signed)
Suture cart at bedside 

## 2019-01-29 ENCOUNTER — Encounter (HOSPITAL_COMMUNITY): Payer: Self-pay | Admitting: Hematology

## 2019-01-29 NOTE — Assessment & Plan Note (Signed)
1.  Metastatic colon cancer, K-ras mutation positive, MSI stable: -Status post FOLFOX for 12 cycles and adjuvant setting from 03/30/2015 through 09/06/2015. - PET scan on 11/19/2017 showing peritoneal carcinomatosis, right paratracheal lymph node, nonspecific increased uptake in the right hilar region.  CEA was 12.9 on 11/16/2017. -9 cycles of FOLFIRI from 01/09/2018 through 05/08/2018 with addition of bevacizumab during cycle 5. - Maintenance 5-FU/leucovorin and bevacizumab started on 05/22/2018. - He had a fracture of the right wrist with ORIF on 10/18/2018. -CT CAP on 12/03/2018 showed slight interval increase in the nodular metastatic soft tissue along the anterior midline ventral peritoneum, largest nodular deposit component measuring 3.6 x 1.7 cm.  Also interval enlargement of metastatic nodule/lymph nodes in the central anterior abdomen, largest nodule measuring 1 cm, previously 0.5 cm.  Other mesenteric and peritoneal nodules are not significantly changed. -He fell at home again as he slipped on the wet floor.  He broke his right ulna as well as right radius.  He underwent open reduction internal fixation on 12/20/2018.  -He follows up with Dr. Fredna Dow.  Latest x-ray showed alignment of the fracture. -We have reviewed his labs today.  He will proceed with his next maintenance treatment today.  I would not change any doses at this time. -He will come back in 2 weeks for follow-up.  We will plan to repeat scans in 3 months.  2.  Pulmonary embolism: -Incidental PE on CT chest on 05/20/2018.  CT angiogram confirmed bilateral PE. -He is on Xarelto and is tolerating it very well.  3.  Peripheral neuropathy: -Pre-existing neuropathy from prior oxaliplatin.  Numbness in the legs has been stable.  4.  Diarrhea: -He was told to take Lomotil as needed.

## 2019-02-04 ENCOUNTER — Inpatient Hospital Stay (HOSPITAL_COMMUNITY): Payer: Medicaid Other

## 2019-02-04 ENCOUNTER — Inpatient Hospital Stay (HOSPITAL_BASED_OUTPATIENT_CLINIC_OR_DEPARTMENT_OTHER): Payer: Medicaid Other | Admitting: Hematology

## 2019-02-04 ENCOUNTER — Other Ambulatory Visit: Payer: Self-pay

## 2019-02-04 VITALS — BP 138/76 | HR 96 | Temp 97.5°F | Resp 18

## 2019-02-04 DIAGNOSIS — Z5111 Encounter for antineoplastic chemotherapy: Secondary | ICD-10-CM | POA: Diagnosis not present

## 2019-02-04 DIAGNOSIS — C189 Malignant neoplasm of colon, unspecified: Secondary | ICD-10-CM

## 2019-02-04 DIAGNOSIS — C188 Malignant neoplasm of overlapping sites of colon: Secondary | ICD-10-CM

## 2019-02-04 DIAGNOSIS — C184 Malignant neoplasm of transverse colon: Secondary | ICD-10-CM

## 2019-02-04 LAB — CBC WITH DIFFERENTIAL/PLATELET
Abs Immature Granulocytes: 0.01 10*3/uL (ref 0.00–0.07)
Basophils Absolute: 0.1 10*3/uL (ref 0.0–0.1)
Basophils Relative: 1 %
Eosinophils Absolute: 0.2 10*3/uL (ref 0.0–0.5)
Eosinophils Relative: 4 %
HCT: 42.4 % (ref 39.0–52.0)
Hemoglobin: 13.6 g/dL (ref 13.0–17.0)
Immature Granulocytes: 0 %
Lymphocytes Relative: 24 %
Lymphs Abs: 1.2 10*3/uL (ref 0.7–4.0)
MCH: 32.2 pg (ref 26.0–34.0)
MCHC: 32.1 g/dL (ref 30.0–36.0)
MCV: 100.2 fL — ABNORMAL HIGH (ref 80.0–100.0)
Monocytes Absolute: 0.6 10*3/uL (ref 0.1–1.0)
Monocytes Relative: 12 %
Neutro Abs: 2.9 10*3/uL (ref 1.7–7.7)
Neutrophils Relative %: 59 %
Platelets: 229 10*3/uL (ref 150–400)
RBC: 4.23 MIL/uL (ref 4.22–5.81)
RDW: 15.5 % (ref 11.5–15.5)
WBC: 4.9 10*3/uL (ref 4.0–10.5)
nRBC: 0 % (ref 0.0–0.2)

## 2019-02-04 LAB — COMPREHENSIVE METABOLIC PANEL
ALT: 13 U/L (ref 0–44)
AST: 15 U/L (ref 15–41)
Albumin: 3.6 g/dL (ref 3.5–5.0)
Alkaline Phosphatase: 81 U/L (ref 38–126)
Anion gap: 12 (ref 5–15)
BUN: 9 mg/dL (ref 8–23)
CO2: 26 mmol/L (ref 22–32)
Calcium: 9.2 mg/dL (ref 8.9–10.3)
Chloride: 102 mmol/L (ref 98–111)
Creatinine, Ser: 0.81 mg/dL (ref 0.61–1.24)
GFR calc Af Amer: >60 mL/min (ref >60)
GFR calc non Af Amer: >60 mL/min (ref >60)
Glucose, Bld: 89 mg/dL (ref 70–99)
Potassium: 4.3 mmol/L (ref 3.5–5.1)
Sodium: 140 mmol/L (ref 135–145)
Total Bilirubin: 0.5 mg/dL (ref 0.3–1.2)
Total Protein: 7 g/dL (ref 6.5–8.1)

## 2019-02-04 MED ORDER — SODIUM CHLORIDE 0.9 % IV SOLN
2400.0000 mg/m2 | INTRAVENOUS | Status: DC
  Administered 2019-02-04: 5650 mg via INTRAVENOUS
  Filled 2019-02-04: qty 113

## 2019-02-04 MED ORDER — SODIUM CHLORIDE 0.9 % IV SOLN
5.0000 mg/kg | Freq: Once | INTRAVENOUS | Status: AC
  Administered 2019-02-04: 500 mg via INTRAVENOUS
  Filled 2019-02-04: qty 16

## 2019-02-04 MED ORDER — SODIUM CHLORIDE 0.9 % IV SOLN: Freq: Once | INTRAVENOUS | Status: AC

## 2019-02-04 MED ORDER — SODIUM CHLORIDE 0.9% FLUSH
10.0000 mL | INTRAVENOUS | Status: DC | PRN
  Administered 2019-02-04: 10 mL

## 2019-02-04 MED ORDER — SODIUM CHLORIDE 0.9 % IV SOLN
900.0000 mg | Freq: Once | INTRAVENOUS | Status: AC
  Administered 2019-02-04: 900 mg via INTRAVENOUS
  Filled 2019-02-04: qty 35

## 2019-02-04 MED ORDER — FLUOROURACIL CHEMO INJECTION 2.5 GM/50ML
400.0000 mg/m2 | Freq: Once | INTRAVENOUS | Status: AC
  Administered 2019-02-04: 950 mg via INTRAVENOUS
  Filled 2019-02-04: qty 19

## 2019-02-04 MED ORDER — PALONOSETRON HCL INJECTION 0.25 MG/5ML
0.2500 mg | Freq: Once | INTRAVENOUS | Status: AC
  Administered 2019-02-04: 0.25 mg via INTRAVENOUS
  Filled 2019-02-04: qty 5

## 2019-02-04 MED ORDER — SODIUM CHLORIDE 0.9 % IV SOLN
10.0000 mg | Freq: Once | INTRAVENOUS | Status: AC
  Administered 2019-02-04: 10 mg via INTRAVENOUS
  Filled 2019-02-04: qty 10

## 2019-02-04 MED ORDER — ATROPINE SULFATE 1 MG/ML IJ SOLN
0.5000 mg | Freq: Once | INTRAMUSCULAR | Status: AC
  Administered 2019-02-04: 0.5 mg via INTRAVENOUS
  Filled 2019-02-04: qty 1

## 2019-02-04 NOTE — Progress Notes (Signed)
To treatment area for oncology follow up and chemotherpy.  Patient stated he fell again a few days ago and now has stitches on his forehead.  Prn diarrhea controlled by imodium.  No s/s of distress noted.   Patient seen with lab review by Lazarus Gowda, with verbal order ok to treat today.   Patient tolerated chemotherapy with no complaints voiced.  Port site clean and dry with no bruising or swelling noted at site.  Good blood return noted before and after administration of chemotherapy.  Chemo pump connected with no alarms noted.  Dressing intact.   Patient left ambulatory with VSS and no s/s of distress noted.

## 2019-02-04 NOTE — Progress Notes (Signed)
[facility] [place], [place] [address]   CLINIC:  Medical Oncology/Hematology  PCP:  Alanson Puls, [facility] [place] [address] [phone]   REASON FOR VISIT:  Follow-up for colon cancer  CURRENT THERAPY: Maintenance 5-FU/leucovorin and bevacizumab   BRIEF ONCOLOGIC HISTORY:  Oncology History Overview Note  Stage IIIC [name] adenocarcinoma of transverse colon, diagnosed on colonoscopy by Dr. Oneida Alar on 01/12/2015 followed by definitive surgery by Dr. [name] with right hemicolectomy on 02/12/2015.  He then underwent FOLFOX x 12 cycles in the adjuvant setting (03/30/2015- 09/06/2015).   Malignant neoplasm of transverse colon ([place])  01/12/2015 Pathologic Stage   Colon, biopsy, distal transverse - TUBULOVILLOUS ADENOMA WITH HIGH GRADE DYSPLASIA.   01/12/2015 Procedure   Colonoscopy by Dr. Oneida Alar.   01/12/2015 Tumor Marker   CEA: 6.6 (H)    01/18/2015 Imaging   CT abd/pelvis- Apple-core lesion identified in the mid transverse colon without obstruction. No evidence for lymphadenopathy in the gastrohepatic ligament or omentum.  Stable 8 mm hypo attenuating lesion in the left liver, likely a cyst.   02/10/2015 Initial Diagnosis   Adenocarcinoma of transverse colon ([place])   02/12/2015 Definitive Surgery   [name], Extended right hemicolectomy    02/12/2015 Pathology Results   Mucinous adenocarcinoma with penetration of visceral peritoneum, 4/19 lymph nodes for metastatic disease, negative resection margins, with LVI and perineural invasion   03/30/2015 - 09/06/2015 Chemotherapy   FOLFOX x 12 cycles   05/25/2015 Treatment Plan Change   5 FU bolus discontinued for cycle #5   06/08/2015 Treatment Plan Change   Treatment deferred x 1 week   06/15/2015 Treatment Plan Change   5FU CI decreased by 10% and Oxaliplatin reduced by 15% for cycles #6-#11; Oxaliplatin dropped for cycle #12 d/t neuropathy.    10/20/2015 Imaging   CT  CAP- Right hemicolectomy without evidence of metastatic disease. 2. Previously measured ground-glass lesion in the left upper lobe has resolved. 3. Probable food debris in the stomach, simulating gastric wall thickening. Please correlate clinically. 4. 6 mm irregular nodular density in the left upper lobe, stable. Continued attention on followup exams is warranted.   01/25/2016 Procedure   Colonoscopy by Dr. Oneida Alar- Non-thrombosed external hemorrhoids found on digital rectal exam. - One 4 mm polyp in the rectum, removed with a cold biopsy forceps. Resected and retrieved. - Congested mucosa in the neo-terminal ileum. Biopsied. - Redundant colon. - Internal hemorrhoids.   01/26/2016 Pathology Results   1. Terminal ileum, biopsy - MILD ACUTE (ACTIVE) ILEITIS. - NO DYSPLASIA OR MALIGNANCY IDENTIFIED - SEE COMMENT. 2. Rectum, polyp(s) - HYPERPLASTIC POLYP (X 1). - NO DYSPLASIA OR MALIGNANCY IDENTIFIED.   04/13/2016 Imaging   CT chest- Stable CT chest. 6 mm irregular nodule anterior left upper lobe is stable. The scattered areas of peribronchovascular micro nodularity in the lungs bilaterally are unchanged.   10/19/2016 Imaging   CT CAP: 1. Status post right hemicolectomy. No findings to suggest metastatic disease in the abdomen or pelvis. 2. Aortic atherosclerosis. 3. Additional incidental findings, as above. Aortic Atherosclerosis (ICD10-I70.0).   01/09/2018 -  Chemotherapy   The patient had palonosetron (ALOXI) injection 0.25 mg, 0.25 mg, Intravenous,  Once, 26 of 29 cycles Administration: 0.25 mg (01/09/2018), 0.25 mg (01/23/2018), 0.25 mg (02/11/2018), 0.25 mg (02/25/2018), 0.25 mg (03/12/2018), 0.25 mg (03/27/2018), 0.25 mg (04/10/2018), 0.25 mg (04/24/2018), 0.25 mg (05/08/2018), 0.25 mg (05/22/2018), 0.25 mg (06/10/2018), 0.25 mg (06/26/2018), 0.25 mg (07/10/2018), 0.25 mg (07/24/2018),  0.25 mg (08/13/2018), 0.25 mg (08/27/2018), 0.25 mg (09/10/2018), 0.25 mg (09/24/2018), 0.25 mg (10/08/2018), 0.25 mg  (11/05/2018), 0.25 mg (11/19/2018), 0.25 mg (12/04/2018), 0.25 mg (12/18/2018), 0.25 mg (01/08/2019), 0.25 mg (01/22/2019), 0.25 mg (02/04/2019) irinotecan (CAMPTOSAR) 420 mg in dextrose 5 % 500 mL chemo infusion, 180 mg/m2 = 420 mg, Intravenous,  Once, 9 of 9 cycles Administration: 420 mg (01/09/2018), 420 mg (01/23/2018), 420 mg (02/11/2018), 420 mg (02/25/2018), 420 mg (03/12/2018), 420 mg (03/27/2018), 420 mg (04/10/2018), 420 mg (04/24/2018), 420 mg (05/08/2018) leucovorin 900 mg in dextrose 5 % 250 mL infusion, 944 mg, Intravenous,  Once, 26 of 29 cycles Administration: 900 mg (01/09/2018), 900 mg (01/23/2018), 900 mg (02/11/2018), 900 mg (02/25/2018), 900 mg (03/12/2018), 900 mg (03/27/2018), 900 mg (04/10/2018), 900 mg (04/24/2018), 900 mg (05/08/2018), 900 mg (06/10/2018), 900 mg (06/26/2018), 900 mg (07/10/2018), 900 mg (07/24/2018), 900 mg (08/13/2018), 900 mg (08/27/2018), 900 mg (09/10/2018), 900 mg (09/24/2018), 900 mg (10/08/2018), 900 mg (11/05/2018), 900 mg (11/19/2018), 900 mg (12/04/2018), 900 mg (12/18/2018), 900 mg (01/08/2019), 900 mg (01/22/2019), 900 mg (02/04/2019) fluorouracil (ADRUCIL) chemo injection 950 mg, 400 mg/m2 = 950 mg, Intravenous,  Once, 26 of 29 cycles Administration: 950 mg (01/09/2018), 950 mg (01/23/2018), 950 mg (02/11/2018), 950 mg (02/25/2018), 950 mg (03/12/2018), 950 mg (03/27/2018), 950 mg (04/10/2018), 950 mg (04/24/2018), 950 mg (05/08/2018), 950 mg (05/22/2018), 950 mg (06/10/2018), 950 mg (06/26/2018), 950 mg (07/10/2018), 950 mg (07/24/2018), 950 mg (08/13/2018), 950 mg (08/27/2018), 950 mg (09/10/2018), 950 mg (09/24/2018), 950 mg (10/08/2018), 950 mg (11/05/2018), 950 mg (11/19/2018), 950 mg (12/04/2018), 950 mg (12/18/2018), 950 mg (01/08/2019), 950 mg (01/22/2019), 950 mg (02/04/2019) fluorouracil (ADRUCIL) 5,650 mg in sodium chloride 0.9 % 137 mL chemo infusion, 2,400 mg/m2 = 5,650 mg, Intravenous, 1 Day/Dose, 26 of 29 cycles Administration: 5,650 mg (01/09/2018), 5,650 mg (01/23/2018), 5,650 mg (02/11/2018), 5,650 mg  (02/25/2018), 5,650 mg (03/12/2018), 5,650 mg (03/27/2018), 5,650 mg (04/10/2018), 5,650 mg (04/24/2018), 5,650 mg (05/08/2018), 5,650 mg (05/22/2018), 5,650 mg (06/10/2018), 5,650 mg (06/26/2018), 5,650 mg (07/10/2018), 5,650 mg (07/24/2018), 5,650 mg (08/13/2018), 5,650 mg (08/27/2018), 5,650 mg (09/10/2018), 5,650 mg (09/24/2018), 5,650 mg (10/08/2018), 5,650 mg (11/05/2018), 5,650 mg (11/19/2018), 5,650 mg (12/04/2018), 5,650 mg (12/18/2018), 5,650 mg (01/08/2019), 5,650 mg (01/22/2019), 5,650 mg (02/04/2019) bevacizumab-awwb (MVASI) 500 mg in sodium chloride 0.9 % 100 mL chemo infusion, 4.7 mg/kg = 525 mg (100 % of original dose 5 mg/kg), Intravenous,  Once, 1 of 1 cycle Dose modification: 5 mg/kg (original dose 5 mg/kg, Cycle 24) Administration: 500 mg (01/08/2019) bevacizumab-bvzr (ZIRABEV) 500 mg in sodium chloride 0.9 % 100 mL chemo infusion, 5 mg/kg = 500 mg (100 % of original dose 5 mg/kg), Intravenous,  Once, 5 of 8 cycles Dose modification: 5 mg/kg (original dose 5 mg/kg, Cycle 21) Administration: 500 mg (11/19/2018), 500 mg (12/04/2018), 500 mg (12/18/2018), 500 mg (01/22/2019), 500 mg (02/04/2019)  for chemotherapy treatment.    01/23/2018 Cancer Staging   Staging form: Colon and Rectum, AJCC 7th Edition - Pathologic: M1 - Signed by Zoila Shutter, MD on 01/23/2018   Malignant neoplasm of overlapping sites of colon ([place])  12/26/2017 Initial Diagnosis   Cancer of overlapping sites of colon metastatic to intra-abdominal lymph node ([place])   01/09/2018 -  Chemotherapy   The patient had palonosetron (ALOXI) injection 0.25 mg, 0.25 mg, Intravenous,  Once, 13 of 14 cycles Administration: 0.25 mg (01/09/2018), 0.25 mg (01/23/2018), 0.25 mg (02/11/2018), 0.25 mg (02/25/2018), 0.25 mg (03/12/2018), 0.25 mg (03/27/2018), 0.25 mg (04/10/2018), 0.25 mg (04/24/2018), 0.25  mg (05/08/2018), 0.25 mg (05/22/2018), 0.25 mg (06/10/2018), 0.25 mg (06/26/2018), 0.25 mg (07/10/2018) irinotecan (CAMPTOSAR) 420 mg in dextrose 5 % 500 mL chemo infusion,  180 mg/m2 = 420 mg, Intravenous,  Once, 9 of 9 cycles Administration: 420 mg (01/09/2018), 420 mg (01/23/2018), 420 mg (02/11/2018), 420 mg (02/25/2018), 420 mg (03/12/2018), 420 mg (03/27/2018), 420 mg (04/10/2018), 420 mg (04/24/2018), 420 mg (05/08/2018) leucovorin 900 mg in dextrose 5 % 250 mL infusion, 944 mg, Intravenous,  Once, 13 of 14 cycles Administration: 900 mg (01/09/2018), 900 mg (01/23/2018), 900 mg (02/11/2018), 900 mg (02/25/2018), 900 mg (03/12/2018), 900 mg (03/27/2018), 900 mg (04/10/2018), 900 mg (04/24/2018), 900 mg (05/08/2018), 900 mg (06/10/2018), 900 mg (06/26/2018), 900 mg (07/10/2018) fluorouracil (ADRUCIL) chemo injection 950 mg, 400 mg/m2 = 950 mg, Intravenous,  Once, 13 of 14 cycles Administration: 950 mg (01/09/2018), 950 mg (01/23/2018), 950 mg (02/11/2018), 950 mg (02/25/2018), 950 mg (03/12/2018), 950 mg (03/27/2018), 950 mg (04/10/2018), 950 mg (04/24/2018), 950 mg (05/08/2018), 950 mg (05/22/2018), 950 mg (06/10/2018), 950 mg (06/26/2018), 950 mg (07/10/2018) fluorouracil (ADRUCIL) 5,650 mg in sodium chloride 0.9 % 137 mL chemo infusion, 2,400 mg/m2 = 5,650 mg, Intravenous, 1 Day/Dose, 13 of 14 cycles Administration: 5,650 mg (01/09/2018), 5,650 mg (01/23/2018), 5,650 mg (02/11/2018), 5,650 mg (02/25/2018), 5,650 mg (03/12/2018), 5,650 mg (03/27/2018), 5,650 mg (04/10/2018), 5,650 mg (04/24/2018), 5,650 mg (05/08/2018), 5,650 mg (05/22/2018), 5,650 mg (06/10/2018), 5,650 mg (06/26/2018), 5,650 mg (07/10/2018)  for chemotherapy treatment.    Colon cancer metastasized to multiple sites (HCC)  01/16/2018 Initial Diagnosis   Colon cancer metastasized to multiple sites (HCC)   01/23/2018 - 02/10/2018 Chemotherapy   The patient had panitumumab (VECTIBIX) 600 mg in sodium chloride 0.9 % 100 mL chemo infusion, 640 mg, Intravenous,  Once, 1 of 3 cycles Administration: 600 mg (01/23/2018)  for chemotherapy treatment.    03/12/2018 - 10/21/2018 Chemotherapy   The patient had bevacizumab (AVASTIN) 500 mg in sodium chloride 0.9 % 100 mL  chemo infusion, 525 mg, Intravenous,  Once, 15 of 22 cycles Administration: 500 mg (03/12/2018), 500 mg (03/27/2018), 500 mg (04/10/2018), 500 mg (04/24/2018), 500 mg (06/10/2018), 500 mg (05/08/2018), 500 mg (05/22/2018), 500 mg (06/26/2018), 500 mg (07/10/2018), 500 mg (07/24/2018), 500 mg (08/13/2018), 500 mg (08/27/2018), 500 mg (09/10/2018), 500 mg (09/24/2018), 500 mg (10/08/2018)  for chemotherapy treatment.       CANCER STAGING: Cancer Staging Malignant neoplasm of transverse colon (HCC) Staging form: Colon and Rectum, AJCC 7th Edition - Pathologic stage from 03/04/2015: Stage IIIC (T4a, N2a, cM0) - Signed by Kefalas, Thomas S, PA-C on 03/04/2015 - Pathologic: M1 - Signed by Higgs, Vetta, MD on 01/23/2018    INTERVAL HISTORY:  Mr. Shimmel 63 y.o. male presents today for follow-up.  He reports overall doing fair.  He reports he has episodes of falling.  He denies any lightheadedness or dizziness.  He states he loses his balance and simply falls.  He fell on 01/29/2019 and currently has lacerations to his head and required 12 stitches.  He was evaluated in the emergency was CT of the head with no acute findings.  He states he is ready to proceed with treatment today.   REVIEW OF SYSTEMS:  Review of Systems  Constitutional: Positive for fatigue.  HENT:  Negative.   Eyes: Negative.   Respiratory: Negative.   Cardiovascular: Negative.   Gastrointestinal: Positive for nausea.  Endocrine: Negative.   Genitourinary: Negative.    Musculoskeletal: Positive for arthralgias and myalgias.  Skin: Negative.   Neurological:   Negative.   Hematological: Negative.   Psychiatric/Behavioral: Negative.      PAST MEDICAL/SURGICAL HISTORY:  Past Medical History:  Diagnosis Date  . Abnormal stress echocardiogram   . Adenocarcinoma of transverse colon (HCC) 02/10/2015  . Alcohol abuse    Heavy Use up until 2010  . Anxiety   . Blood transfusion without reported diagnosis   . Cancer of overlapping sites of colon  metastatic to intra-abdominal lymph node (HCC) 12/26/2017  . COPD (chronic obstructive pulmonary disease) (HCC)   . Depression   . Emphysema of lung (HCC)   . GERD (gastroesophageal reflux disease)   . Head trauma 2001   closed head injury; coma for 4 weeks  . Hypercholesterolemia   . Hypertension   . ING HERN W/GANGREN RECUR UNILAT/UNSPEC ING HERN 04/21/2009   Qualifier: Diagnosis of  By: Wall, MD, FACC, Thomas C   . PTSD (post-traumatic stress disorder)   . Pulmonary nodule 06/14/2016  . SAH (subarachnoid hemorrhage) (HCC) 08/31/2012  . SDH (subdural hematoma) (HCC) 08/31/2012   Past Surgical History:  Procedure Laterality Date  . BIOPSY  01/25/2016   Procedure: BIOPSY;  Surgeon: Sandi L Fields, MD;  [facility];  Service: Endoscopy;;  ileum;   . cardiac cath    . COLONOSCOPY  2011   Dr. Fields: multiple adenomas and hyperplastic polyps  . COLONOSCOPY WITH PROPOFOL N/A 01/12/2015   Procedure: COLONOSCOPY WITH PROPOFOL;  Surgeon: Sandi L Fields, MD;  [facility]: [facility];  Service: Endoscopy;  Laterality: N/A;  1030  . COLONOSCOPY WITH PROPOFOL N/A 01/25/2016   Procedure: COLONOSCOPY WITH PROPOFOL;  Surgeon: Sandi L Fields, MD;  [facility];  Service: Endoscopy;  Laterality: N/A;  1230  . CRANIOTOMY  2001  . ESOPHAGOGASTRODUODENOSCOPY (EGD) WITH PROPOFOL N/A 01/12/2015   Procedure: ESOPHAGOGASTRODUODENOSCOPY (EGD) WITH PROPOFOL;  Surgeon: Sandi L Fields, MD;  [facility];  Service: Endoscopy;  Laterality: N/A;  . head injury surgery    . HERNIA REPAIR Right 2012   Inguinal-   . KIDNEY SURGERY     >30 years ago  . LAPAROSCOPIC RIGHT HEMI COLECTOMY Left 02/10/2015   Procedure: LAPAROSCOPIC THEN OPEN RIGHT HEMI COLECTOMY;  Surgeon: Charles Woodham, MD;  Location: [facility];  Service: General;  Laterality: Left;  . LAPAROTOMY N/A 12/19/2017   Procedure: EXPLORATORY LAPAROTOMY;  Surgeon: Jenkins, Mark, MD;  Location: [facility];  Service: General;   Laterality: N/A;  . OPEN REDUCTION INTERNAL FIXATION (ORIF) DISTAL RADIAL FRACTURE Right 10/18/2018   Procedure: OPEN REDUCTION INTERNAL FIXATION (ORIF) RIGHT DISTAL RADIAL FRACTURE;  Surgeon: Kuzma, Kevin, MD;  Location: [facility];  Service: Orthopedics;  Laterality: Right;  block in preop  . OPEN REDUCTION INTERNAL FIXATION (ORIF) DISTAL RADIAL FRACTURE Right 12/20/2018   Procedure: OPEN REDUCTION INTERNAL FIXATION (ORIF) DISTAL RADIUS AND ULNA FRACTURE;  Surgeon: Kuzma, Kevin, MD;  Location: [facility];  Service: Orthopedics;  Laterality: Right;  block  . POLYPECTOMY  01/25/2016   Procedure: POLYPECTOMY;  Surgeon: Sandi L Fields, MD;  [facility];  Service: Endoscopy;;  colon  . PORT-A-CATH REMOVAL N/A 02/26/2017   Procedure: MINOR REMOVAL PORT-A-CATH;  Surgeon: Jenkins, Mark, MD;  Location: [facility];  Service: General;  Laterality: N/A;  Pt to arrive at 8:00am  . PORTACATH PLACEMENT N/A 03/24/2015   Procedure: INSERTION PORT-A-CATH;  Surgeon: Diego F Pabon, MD;  Location: [facility];  Service: General;  Laterality: N/A;  . PORTACATH PLACEMENT Left 01/07/2018     Procedure: INSERTION PORT A CATH (ATTACHED CATHETER IN LEFT SUBCLAVIAN);  Surgeon: Jenkins, Mark, MD;  Location: [facility];  Service: General;  Laterality: Left;     SOCIAL HISTORY:  Social History   Socioeconomic History  . Marital status: Divorced    Spouse name: Not on file  . Number of children: 2  . Years of education: 12  . Highest education level: Not on file  Occupational History  . Occupation: disabled/unemployed  . Occupation: unemployed/Mediicaid only    Comment: NO INCOME  Tobacco Use  . Smoking status: Former Smoker    Packs/day: 0.10    Years: 30.00    Pack years: 3.00    Types: Cigarettes    Start date: 02/06/1973    Quit date: 12/09/2017    Years since quitting: 1.1  . Smokeless tobacco: Never Used  . Tobacco comment: Quit on 12/09/2017  Substance and Sexual Activity  .  Alcohol use: Yes    Alcohol/week: 0.0 standard drinks    Comment: beer occ, history of ETOH abuse in remote past.   . Drug use: No  . Sexual activity: Never    Partners: Female    Birth control/protection: None  Other Topics Concern  . Not on file  Social History Narrative   Single   Lives alone   Turned down for disability   Social Determinants of Health   Financial Resource Strain: High Risk  . Difficulty of Paying Living Expenses: Very hard  Food Insecurity: Food Insecurity Present  . Worried About Running Out of Food in the Last Year: Often true  . Ran Out of Food in the Last Year: Often true  Transportation Needs: Unmet Transportation Needs  . Lack of Transportation (Medical): Yes  . Lack of Transportation (Non-Medical): No  Physical Activity:   . Days of Exercise per Week: Not on file  . Minutes of Exercise per Session: Not on file  Stress:   . Feeling of Stress : Not on file  Social Connections:   . Frequency of Communication with Friends and Family: Not on file  . Frequency of Social Gatherings with Friends and Family: Not on file  . Attends Religious Services: Not on file  . Active Member of Clubs or Organizations: Not on file  . Attends Club or Organization Meetings: Not on file  . Marital Status: Not on file  Intimate Partner Violence:   . Fear of Current or Ex-Partner: Not on file  . Emotionally Abused: Not on file  . Physically Abused: Not on file  . Sexually Abused: Not on file    FAMILY HISTORY:  Family History  Problem Relation Age of Onset  . Pulmonary embolism Mother   . Breast cancer Mother   . Cancer Mother        breast cancer  . Arthritis Mother   . Heart disease Father   . Cancer Father 76       Leukemia  . Cancer Paternal Grandfather        Lung  . Ataxia Neg Hx   . Chorea Neg Hx   . Dementia Neg Hx   . Mental retardation Neg Hx   . Migraines Neg Hx   . Multiple sclerosis Neg Hx   . Neurofibromatosis Neg Hx   . Neuropathy Neg Hx     . Parkinsonism Neg Hx   . Seizures Neg Hx   . Stroke Neg Hx   . Colon cancer Neg Hx     CURRENT MEDICATIONS:  Outpatient   Encounter Medications as of 02/04/2019  Medication Sig Note  . amitriptyline (ELAVIL) 25 MG tablet Take 1 tablet (25 mg total) by mouth at bedtime.   Marland Kitchen atorvastatin (LIPITOR) 20 MG tablet TAKE 1 TABLET BY MOUTH AT BEDTIME (Patient taking differently: Take 20 mg by mouth daily. )   . buPROPion (WELLBUTRIN SR) 150 MG 12 hr tablet Take 150 mg by mouth every morning.    . busPIRone (BUSPAR) 10 MG tablet Take 10 mg by mouth 2 (two) times daily.   Marland Kitchen doxycycline (VIBRAMYCIN) 50 MG capsule Take 2 capsules (100 mg total) by mouth 2 (two) times daily.   . fluorouracil CALGB 33612 in sodium chloride 0.9 % 150 mL Inject 5,650 mg into the vein. Over 46 hours   . FLUoxetine (PROZAC) 20 MG capsule TAKE 1 CAPSULE BY MOUTH 2 TIMES A DAY (Patient taking differently: Take 20 mg by mouth 2 (two) times daily. ) 06/10/2018: 1 tab TID  . gabapentin (NEURONTIN) 300 MG capsule TAKE 1 CAPSULE BY MOUTH THREE TIMES A DAY   . IRINOTECAN HCL IV Inject 420 mg into the vein every 14 (fourteen) days.   Marland Kitchen LEUCOVORIN CALCIUM IV Inject 944 mg into the vein every 14 (fourteen) days.   Marland Kitchen lidocaine-prilocaine (EMLA) cream APPLY A SMALL AMOUNT OVER PORT SITE AND COVER WITH PLASTIC WRAP ONE HOUR PRIOR TO APPOINTMENT   . loperamide (IMODIUM) 2 MG capsule TAKE 2 CAPSULES BY MOUTH TWICE DAILY   . omeprazole (PRILOSEC) 20 MG capsule Take 20 mg by mouth every morning.    . rivaroxaban (XARELTO) 20 MG TABS tablet Take 1 tablet (20 mg total) by mouth daily with supper.   . Vitamin D, Ergocalciferol, (DRISDOL) 1.25 MG (50000 UT) CAPS capsule Take 50,000 Units by mouth once a week.   . WELLBUTRIN XL 150 MG 24 hr tablet Take 150 mg by mouth every morning.   . ondansetron (ZOFRAN) 8 MG tablet Take 1 tablet (8 mg total) by mouth 2 (two) times daily as needed for refractory nausea / vomiting. Start on day 3 after  chemotherapy. (Patient not taking: Reported on 02/04/2019)   . PROAIR HFA 108 (90 Base) MCG/ACT inhaler INHALE 2 PUFFS BY MOUTH EVERY 6 HOURS AS NEEDED FOR SHORTNESS OF BREATH/WHEEZING. (Patient not taking: Reported on 02/04/2019)   . prochlorperazine (COMPAZINE) 10 MG tablet Take 1 tablet (10 mg total) by mouth every 6 (six) hours as needed (NAUSEA). (Patient not taking: Reported on 02/04/2019)    Facility-Administered Encounter Medications as of 02/04/2019  Medication  . sodium chloride flush (NS) 0.9 % injection 10 mL    ALLERGIES:  No Known Allergies   PHYSICAL EXAM:  ECOG Performance status: 2  There were no vitals filed for this visit. There were no vitals filed for this visit.  Physical Exam Constitutional:      Appearance: Normal appearance.  HENT:     Head: Normocephalic.     Right Ear: External ear normal.     Nose: Nose normal.     Mouth/Throat:     Pharynx: Oropharynx is clear.  Eyes:     Conjunctiva/sclera: Conjunctivae normal.  Cardiovascular:     Rate and Rhythm: Normal rate and regular rhythm.     Pulses: Normal pulses.     Heart sounds: Normal heart sounds.  Pulmonary:     Effort: Pulmonary effort is normal.     Breath sounds: Normal breath sounds.  Abdominal:     General: Bowel sounds are normal.  Musculoskeletal:  Cervical back: Normal range of motion.     Comments: Decreased ROM   Skin:    General: Skin is warm.  Neurological:     General: No focal deficit present.     Mental Status: He is alert and oriented to person, place, and time.  Psychiatric:        Mood and Affect: Mood normal.        Behavior: Behavior normal.      LABORATORY DATA:  I have reviewed the labs as listed.  CBC    Component Value Date/Time   WBC 4.9 02/04/2019 0818   RBC 4.23 02/04/2019 0818   HGB 13.6 02/04/2019 0818   HCT 42.4 02/04/2019 0818   PLT 229 02/04/2019 0818   MCV 100.2 (H) 02/04/2019 0818   MCH 32.2 02/04/2019 0818   MCHC 32.1 02/04/2019 0818    RDW 15.5 02/04/2019 0818   LYMPHSABS 1.2 02/04/2019 0818   MONOABS 0.6 02/04/2019 0818   EOSABS 0.2 02/04/2019 0818   BASOSABS 0.1 02/04/2019 0818   CMP Latest Ref Rng & Units 02/04/2019 01/22/2019 01/08/2019  Glucose 70 - 99 mg/dL 89 110(H) 118(H)  BUN 8 - 23 mg/dL 9 14 9  Creatinine 0.61 - 1.24 mg/dL 0.81 0.82 0.76  Sodium 135 - 145 mmol/L 140 137 138  Potassium 3.5 - 5.1 mmol/L 4.3 4.4 3.8  Chloride 98 - 111 mmol/L 102 100 101  CO2 22 - 32 mmol/L 26 27 25  Calcium 8.9 - 10.3 mg/dL 9.2 9.2 9.3  Total Protein 6.5 - 8.1 g/dL 7.0 6.8 6.8  Total Bilirubin 0.3 - 1.2 mg/dL 0.5 0.7 0.8  Alkaline Phos 38 - 126 U/L 81 94 93  AST 15 - 41 U/L 15 15 14(L)  ALT 0 - 44 U/L 13 12 13           ASSESSMENT & PLAN:   Colon cancer metastasized to multiple sites (HCC) 1.  Metastatic colon cancer, K-ras mutation positive, MSI stable: -Status post FOLFOX for 12 cycles and adjuvant setting from 03/30/2015 through 09/06/2015. - PET scan on 11/19/2017 showing peritoneal carcinomatosis, right paratracheal lymph node, nonspecific increased uptake in the right hilar region.  CEA was 12.9 on 11/16/2017. -9 cycles of FOLFIRI from 01/09/2018 through 05/08/2018 with addition of bevacizumab during cycle 5. - Maintenance 5-FU/leucovorin and bevacizumab started on 05/22/2018. - He had a fracture of the right wrist with ORIF on 10/18/2018. -CT CAP on 12/03/2018 showed slight interval increase in the nodular metastatic soft tissue along the anterior midline ventral peritoneum, largest nodular deposit component measuring 3.6 x 1.7 cm.  Also interval enlargement of metastatic nodule/lymph nodes in the central anterior abdomen, largest nodule measuring 1 cm, previously 0.5 cm.  Other mesenteric and peritoneal nodules are not significantly changed. -  I would not change any doses at this time. -He fell at home again as he slipped on the wet floor.  He broke his right ulna as well as right radius.  He underwent open  reduction internal fixation on 12/20/2018.  -He follows up with Dr. Kuzma.  Latest x-ray showed alignment of the fracture. -We have reviewed his labs today.  He will proceed with his next maintenance treatment today.  I -He will come back in 2 weeks for follow-up.  We will plan to repeat scans at the end of January.  2.  Pulmonary embolism: -Incidental PE on CT chest on 05/20/2018.  CT angiogram confirmed bilateral PE. -He is on Xarelto and is tolerating it very   well.  3.  Peripheral neuropathy: -Pre-existing neuropathy from prior oxaliplatin.  Numbness in the legs has been stable.  4.  Diarrhea: -He was told to take Lomotil as needed.  5.  Multiple falls -Patient has had multiple falls over the last several months.  Most recent fall occurred on January 25, 2019 where he fell and hit a coffee table.  The patient denies any lightheadedness or dizziness.  He states he simply blacks out during these falls.  He suffered a laceration to his head that required 12 stitches.  CT of the head was negative for any acute findings. -Have recommended an MRI of brain for further work-up.      Orders placed this encounter:  Orders Placed This Encounter  Procedures  . MR Brain W Contrast      Kim R Nester  [facility] [phone]   

## 2019-02-04 NOTE — Patient Instructions (Signed)
[facility] at [facility] Discharge Instructions  Labs drawn from portacath today   Thank you for choosing [facility] at [facility] to provide your oncology and hematology care.  To afford each patient quality time with our provider, please arrive at least 15 minutes before your scheduled appointment time.   If you have a lab appointment with the Cancer Center please come in thru the Main Entrance and check in at the main information desk.  You need to re-schedule your appointment should you arrive 10 or more minutes late.  We strive to give you quality time with our providers, and arriving late affects you and other patients whose appointments are after yours.  Also, if you no show three or more times for appointments you may be dismissed from the clinic at the providers discretion.     Again, thank you for choosing [facility].  Our hope is that these requests will decrease the amount of time that you wait before being seen by our physicians.       _____________________________________________________________  Should you have questions after your visit to [facility], please contact our office at [phone] between the hours of 8:00 a.m. and 4:30 p.m.  Voicemails left after 4:00 p.m. will not be returned until the following business day.  For prescription refill requests, have your pharmacy contact our office and allow 72 hours.    Due to Covid, you will need to wear a mask upon entering the hospital. If you do not have a mask, a mask will be given to you at the Main Entrance upon arrival. For doctor visits, patients may have 1 support person with them. For treatment visits, patients can not have anyone with them due to social distancing guidelines and our immunocompromised population.     

## 2019-02-04 NOTE — Assessment & Plan Note (Signed)
1.  Metastatic colon cancer, K-ras mutation positive, MSI stable: -Status post FOLFOX for 12 cycles and adjuvant setting from 03/30/2015 through 09/06/2015. - PET scan on 11/19/2017 showing peritoneal carcinomatosis, right paratracheal lymph node, nonspecific increased uptake in the right hilar region.  CEA was 12.9 on 11/16/2017. -9 cycles of FOLFIRI from 01/09/2018 through 05/08/2018 with addition of bevacizumab during cycle 5. - Maintenance 5-FU/leucovorin and bevacizumab started on 05/22/2018. - He had a fracture of the right wrist with ORIF on 10/18/2018. -CT CAP on 12/03/2018 showed slight interval increase in the nodular metastatic soft tissue along the anterior midline ventral peritoneum, largest nodular deposit component measuring 3.6 x 1.7 cm.  Also interval enlargement of metastatic nodule/lymph nodes in the central anterior abdomen, largest nodule measuring 1 cm, previously 0.5 cm.  Other mesenteric and peritoneal nodules are not significantly changed. -  I would not change any doses at this time. -He fell at home again as he slipped on the wet floor.  He broke his right ulna as well as right radius.  He underwent open reduction internal fixation on 12/20/2018.  -He follows up with Dr. Fredna Dow.  Latest x-ray showed alignment of the fracture. -We have reviewed his labs today.  He will proceed with his next maintenance treatment today.  I -He will come back in 2 weeks for follow-up.  We will plan to repeat scans at the end of January.  2.  Pulmonary embolism: -Incidental PE on CT chest on 05/20/2018.  CT angiogram confirmed bilateral PE. -He is on Xarelto and is tolerating it very well.  3.  Peripheral neuropathy: -Pre-existing neuropathy from prior oxaliplatin.  Numbness in the legs has been stable.  4.  Diarrhea: -He was told to take Lomotil as needed.  5.  Multiple falls -Patient has had multiple falls over the last several months.  Most recent fall occurred on January 25, 2019 where he  fell and hit a coffee table.  The patient denies any lightheadedness or dizziness.  He states he simply blacks out during these falls.  He suffered a laceration to his head that required 12 stitches.  CT of the head was negative for any acute findings. -Have recommended an MRI of brain for further work-up.

## 2019-02-06 ENCOUNTER — Encounter (HOSPITAL_COMMUNITY): Payer: Self-pay

## 2019-02-06 ENCOUNTER — Inpatient Hospital Stay (HOSPITAL_COMMUNITY): Payer: Medicaid Other

## 2019-02-06 ENCOUNTER — Other Ambulatory Visit: Payer: Self-pay

## 2019-02-06 VITALS — BP 146/73 | HR 88 | Temp 97.1°F | Resp 18

## 2019-02-06 DIAGNOSIS — C184 Malignant neoplasm of transverse colon: Secondary | ICD-10-CM

## 2019-02-06 DIAGNOSIS — C188 Malignant neoplasm of overlapping sites of colon: Secondary | ICD-10-CM

## 2019-02-06 DIAGNOSIS — Z5111 Encounter for antineoplastic chemotherapy: Secondary | ICD-10-CM | POA: Diagnosis not present

## 2019-02-06 MED ORDER — SODIUM CHLORIDE 0.9% FLUSH
10.0000 mL | INTRAVENOUS | Status: DC | PRN
  Administered 2019-02-06: 10 mL

## 2019-02-06 MED ORDER — HEPARIN SOD (PORK) LOCK FLUSH 100 UNIT/ML IV SOLN
500.0000 [IU] | Freq: Once | INTRAVENOUS | Status: AC | PRN
  Administered 2019-02-06: 500 [IU]

## 2019-02-06 NOTE — Progress Notes (Signed)
Erik [name] tolerated 5FU pump well without complaints or incident. 5FU pump discontinued with portacath flushed easily per protocol then de-accessed. VSS Pt discharged self ambulatory using his cane in satisfactory condition

## 2019-02-06 NOTE — Patient Instructions (Signed)
[place] at [facility] Discharge Instructions  5FU pump discontinued with portacath flushed per protocol today. Follow-up as scheduled. Call clinic for any questions or concerns   Thank you for choosing [place] at [facility] to provide your oncology and hematology care.  To afford each patient quality time with our provider, please arrive at least 15 minutes before your scheduled appointment time.   If you have a lab appointment with the [facility] please come in thru the Main Entrance and check in at the main information desk.  You need to re-schedule your appointment should you arrive 10 or more minutes late.  We strive to give you quality time with our providers, and arriving late affects you and other patients whose appointments are after yours.  Also, if you no show three or more times for appointments you may be dismissed from the clinic at the providers discretion.     Again, thank you for choosing [facility].  Our hope is that these requests will decrease the amount of time that you wait before being seen by our physicians.       _____________________________________________________________  Should you have questions after your visit to [facility], please contact our office at [phone] between the hours of 8:00 a.m. and 4:30 p.m.  Voicemails left after 4:00 p.m. will not be returned until the following business day.  For prescription refill requests, have your pharmacy contact our office and allow 72 hours.    Due to Covid, you will need to wear a mask upon entering the hospital. If you do not have a mask, a mask will be given to you at the Main Entrance upon arrival. For doctor visits, patients may have 1 support person with them. For treatment visits, patients can not have anyone with them due to social distancing guidelines and our immunocompromised population.

## 2019-02-12 ENCOUNTER — Emergency Department (HOSPITAL_COMMUNITY)
Admission: EM | Admit: 2019-02-12 | Discharge: 2019-02-12 | Discharge status: Absent without leave | Payer: Medicaid Other

## 2019-02-12 ENCOUNTER — Encounter: Payer: Self-pay | Admitting: *Deleted

## 2019-02-12 ENCOUNTER — Ambulatory Visit (HOSPITAL_COMMUNITY)
Admission: RE | Admit: 2019-02-12 | Discharge: 2019-02-12 | Disposition: A | Discharge status: Home / Self Care | Payer: Medicaid Other | Source: Ambulatory Visit | Attending: Hematology | Admitting: Hematology

## 2019-02-12 ENCOUNTER — Other Ambulatory Visit: Payer: Self-pay

## 2019-02-12 ENCOUNTER — Telehealth (HOSPITAL_COMMUNITY): Payer: Self-pay | Admitting: *Deleted

## 2019-02-12 DIAGNOSIS — C189 Malignant neoplasm of colon, unspecified: Secondary | ICD-10-CM

## 2019-02-12 MED ORDER — GADOBUTROL 1 MMOL/ML IV SOLN
10.0000 mL | Freq: Once | INTRAVENOUS | Status: AC | PRN
  Administered 2019-02-12: 10 mL via INTRAVENOUS

## 2019-02-12 NOTE — Telephone Encounter (Signed)
[facility] faxed a clinical question stating pt reported he doesn't take Xarelto 20 mg anymore. I spoke with pt asking if any other provider took him off of this medication. Pt stated "no he didn't start back taking his medication since his wrist surgery". I spoke with Dr.Katragadda and he stated for pt to start back taking Xarelto as presribed. Called pt back and he verbalized understanding.

## 2019-02-18 ENCOUNTER — Inpatient Hospital Stay (HOSPITAL_COMMUNITY): Payer: Medicaid Other

## 2019-02-18 ENCOUNTER — Encounter (HOSPITAL_COMMUNITY): Payer: Self-pay | Admitting: Hematology

## 2019-02-18 ENCOUNTER — Other Ambulatory Visit: Payer: Self-pay

## 2019-02-18 ENCOUNTER — Inpatient Hospital Stay (HOSPITAL_COMMUNITY): Payer: Medicaid Other | Attending: Hematology | Admitting: Hematology

## 2019-02-18 VITALS — BP 142/86 | HR 98 | Temp 97.1°F | Resp 18 | Wt 219.2 lb

## 2019-02-18 DIAGNOSIS — C188 Malignant neoplasm of overlapping sites of colon: Secondary | ICD-10-CM

## 2019-02-18 DIAGNOSIS — Z5111 Encounter for antineoplastic chemotherapy: Secondary | ICD-10-CM | POA: Insufficient documentation

## 2019-02-18 DIAGNOSIS — C786 Secondary malignant neoplasm of retroperitoneum and peritoneum: Secondary | ICD-10-CM | POA: Diagnosis not present

## 2019-02-18 DIAGNOSIS — C184 Malignant neoplasm of transverse colon: Secondary | ICD-10-CM | POA: Diagnosis present

## 2019-02-18 DIAGNOSIS — E538 Deficiency of other specified B group vitamins: Secondary | ICD-10-CM

## 2019-02-18 DIAGNOSIS — Z5112 Encounter for antineoplastic immunotherapy: Secondary | ICD-10-CM | POA: Insufficient documentation

## 2019-02-18 LAB — CBC WITH DIFFERENTIAL/PLATELET
Abs Immature Granulocytes: 0.03 10*3/uL (ref 0.00–0.07)
Basophils Absolute: 0.1 10*3/uL (ref 0.0–0.1)
Basophils Relative: 1 %
Eosinophils Absolute: 0.1 10*3/uL (ref 0.0–0.5)
Eosinophils Relative: 2 %
HCT: 44.4 % (ref 39.0–52.0)
Hemoglobin: 14.6 g/dL (ref 13.0–17.0)
Immature Granulocytes: 0 %
Lymphocytes Relative: 16 %
Lymphs Abs: 1.3 10*3/uL (ref 0.7–4.0)
MCH: 32.4 pg (ref 26.0–34.0)
MCHC: 32.9 g/dL (ref 30.0–36.0)
MCV: 98.7 fL (ref 80.0–100.0)
Monocytes Absolute: 0.9 10*3/uL (ref 0.1–1.0)
Monocytes Relative: 11 %
Neutro Abs: 5.8 10*3/uL (ref 1.7–7.7)
Neutrophils Relative %: 70 %
Platelets: 224 10*3/uL (ref 150–400)
RBC: 4.5 MIL/uL (ref 4.22–5.81)
RDW: 15.7 % — ABNORMAL HIGH (ref 11.5–15.5)
WBC: 8.2 10*3/uL (ref 4.0–10.5)
nRBC: 0 % (ref 0.0–0.2)

## 2019-02-18 LAB — COMPREHENSIVE METABOLIC PANEL
ALT: 14 U/L (ref 0–44)
AST: 15 U/L (ref 15–41)
Albumin: 4.1 g/dL (ref 3.5–5.0)
Alkaline Phosphatase: 90 U/L (ref 38–126)
Anion gap: 12 (ref 5–15)
BUN: 9 mg/dL (ref 8–23)
CO2: 24 mmol/L (ref 22–32)
Calcium: 9.4 mg/dL (ref 8.9–10.3)
Chloride: 98 mmol/L (ref 98–111)
Creatinine, Ser: 0.91 mg/dL (ref 0.61–1.24)
GFR calc Af Amer: >60 mL/min (ref >60)
GFR calc non Af Amer: >60 mL/min (ref >60)
Glucose, Bld: 122 mg/dL — ABNORMAL HIGH (ref 70–99)
Potassium: 3.8 mmol/L (ref 3.5–5.1)
Sodium: 134 mmol/L — ABNORMAL LOW (ref 135–145)
Total Bilirubin: 0.9 mg/dL (ref 0.3–1.2)
Total Protein: 7.8 g/dL (ref 6.5–8.1)

## 2019-02-18 MED ORDER — CYANOCOBALAMIN 1000 MCG/ML IJ SOLN
1000.0000 ug | Freq: Once | INTRAMUSCULAR | Status: AC
  Administered 2019-02-18: 11:00:00 1000 ug via INTRAMUSCULAR
  Filled 2019-02-18: qty 1

## 2019-02-18 MED ORDER — SODIUM CHLORIDE 0.9 % IV SOLN
900.0000 mg | Freq: Once | INTRAVENOUS | Status: AC
  Administered 2019-02-18: 12:00:00 900 mg via INTRAVENOUS
  Filled 2019-02-18: qty 10

## 2019-02-18 MED ORDER — SODIUM CHLORIDE 0.9 % IV SOLN: Freq: Once | INTRAVENOUS | Status: AC

## 2019-02-18 MED ORDER — ATROPINE SULFATE 1 MG/ML IJ SOLN
0.5000 mg | Freq: Once | INTRAMUSCULAR | Status: AC
  Administered 2019-02-18: 11:00:00 0.5 mg via INTRAVENOUS
  Filled 2019-02-18: qty 1

## 2019-02-18 MED ORDER — FLUOROURACIL CHEMO INJECTION 2.5 GM/50ML
400.0000 mg/m2 | Freq: Once | INTRAVENOUS | Status: AC
  Administered 2019-02-18: 950 mg via INTRAVENOUS
  Filled 2019-02-18: qty 19

## 2019-02-18 MED ORDER — SODIUM CHLORIDE 0.9 % IV SOLN
2400.0000 mg/m2 | INTRAVENOUS | Status: DC
  Administered 2019-02-18: 13:00:00 5650 mg via INTRAVENOUS
  Filled 2019-02-18: qty 113

## 2019-02-18 MED ORDER — SODIUM CHLORIDE 0.9% FLUSH
10.0000 mL | INTRAVENOUS | Status: DC | PRN
  Administered 2019-02-18: 10 mL

## 2019-02-18 MED ORDER — PALONOSETRON HCL INJECTION 0.25 MG/5ML
0.2500 mg | Freq: Once | INTRAVENOUS | Status: AC
  Administered 2019-02-18: 11:00:00 0.25 mg via INTRAVENOUS
  Filled 2019-02-18: qty 5

## 2019-02-18 MED ORDER — SODIUM CHLORIDE 0.9 % IV SOLN
5.0000 mg/kg | Freq: Once | INTRAVENOUS | Status: AC
  Administered 2019-02-18: 12:00:00 500 mg via INTRAVENOUS
  Filled 2019-02-18: qty 16

## 2019-02-18 MED ORDER — SODIUM CHLORIDE 0.9 % IV SOLN
10.0000 mg | Freq: Once | INTRAVENOUS | Status: AC
  Administered 2019-02-18: 11:00:00 10 mg via INTRAVENOUS
  Filled 2019-02-18: qty 10

## 2019-02-18 NOTE — Patient Instructions (Signed)
[facility] Discharge Instructions for Patients Receiving Chemotherapy  Today you received the following chemotherapy agents   To help prevent nausea and vomiting after your treatment, we encourage you to take your nausea medication   If you develop nausea and vomiting that is not controlled by your nausea medication, call the clinic.   BELOW ARE SYMPTOMS THAT SHOULD BE REPORTED IMMEDIATELY:  *FEVER GREATER THAN 100.5 F  *CHILLS WITH OR WITHOUT FEVER  NAUSEA AND VOMITING THAT IS NOT CONTROLLED WITH YOUR NAUSEA MEDICATION  *UNUSUAL SHORTNESS OF BREATH  *UNUSUAL BRUISING OR BLEEDING  TENDERNESS IN MOUTH AND THROAT WITH OR WITHOUT PRESENCE OF ULCERS  *URINARY PROBLEMS  *BOWEL PROBLEMS  UNUSUAL RASH Items with * indicate a potential emergency and should be followed up as soon as possible.  Feel free to call the clinic should you have any questions or concerns. The clinic phone number is [phone].  Please show the CHEMO ALERT CARD at check-in to the Emergency Department and triage nurse.   

## 2019-02-18 NOTE — Patient Instructions (Addendum)
[place] at [facility] Discharge Instructions  You were seen today by Dr. Delton Coombes. He went over your recent lab results. He will repeat your scan prior to your next visit. He will see you back in 2 weeks for labs, treatment and follow up.   Thank you for choosing [place] at [facility] to provide your oncology and hematology care.  To afford each patient quality time with our provider, please arrive at least 15 minutes before your scheduled appointment time.   If you have a lab appointment with the [facility] please come in thru the  Main Entrance and check in at the main information desk  You need to re-schedule your appointment should you arrive 10 or more minutes late.  We strive to give you quality time with our providers, and arriving late affects you and other patients whose appointments are after yours.  Also, if you no show three or more times for appointments you may be dismissed from the clinic at the providers discretion.     Again, thank you for choosing [facility].  Our hope is that these requests will decrease the amount of time that you wait before being seen by our physicians.       _____________________________________________________________  Should you have questions after your visit to [facility], please contact our office at [phone] between the hours of 8:00 a.m. and 4:30 p.m.  Voicemails left after 4:00 p.m. will not be returned until the following business day.  For prescription refill requests, have your pharmacy contact our office and allow 72 hours.    Cancer Center Support Programs:   > Cancer Support Group  2nd Tuesday of the month 1pm-2pm, Journey Room

## 2019-02-18 NOTE — Progress Notes (Signed)
Labs reviewed at office visit today with MD. Proceed with treatment today as planned.   HR 113, ok to proceed per MD.   Treatment given per orders. Patient tolerated it well without problems. Vitals stable and discharged home from clinic via wheelchair. Follow up as scheduled.

## 2019-02-18 NOTE — Progress Notes (Signed)
**Note De-[name] via Obfuscation** Erik [name], [place] [address]   CLINIC:  Medical Oncology/Hematology  PCP:  Alanson Puls, [facility] [place] [address] [phone]   REASON FOR VISIT:  Follow-up for metastatic colon cancer   BRIEF ONCOLOGIC HISTORY:  Oncology History Overview Note  Stage IIIC Erik [name]) adenocarcinoma of transverse colon, diagnosed on colonoscopy by Dr. Oneida Alar on 01/12/2015 followed by definitive surgery by Dr. [name] with right hemicolectomy on 02/12/2015.  He then underwent FOLFOX x 12 cycles in the adjuvant setting (03/30/2015- 09/06/2015).   Malignant neoplasm of transverse colon (Erik [name])  01/12/2015 Pathologic Stage   Colon, biopsy, distal transverse - TUBULOVILLOUS ADENOMA WITH HIGH GRADE DYSPLASIA.   01/12/2015 Procedure   Colonoscopy by Dr. Oneida Alar.   01/12/2015 Tumor Marker   CEA: 6.6 (H)    01/18/2015 Imaging   CT abd/pelvis- Erik [name] in the mid transverse colon without obstruction. No evidence for lymphadenopathy in the gastrohepatic ligament or omentum.  Stable 8 mm hypo attenuating lesion in the left liver, likely a cyst.   02/10/2015 Initial Diagnosis   Adenocarcinoma of transverse colon (Erik [name])   02/12/2015 Definitive Surgery   [name], Extended right hemicolectomy    02/12/2015 Pathology Results   Mucinous adenocarcinoma with penetration of visceral peritoneum, 4/19 lymph nodes for metastatic disease, negative resection margins, with LVI and perineural invasion   03/30/2015 - 09/06/2015 Chemotherapy   FOLFOX x 12 cycles   05/25/2015 Treatment Plan Change   5 FU bolus discontinued for cycle #5   06/08/2015 Treatment Plan Change   Treatment deferred x 1 week   06/15/2015 Treatment Plan Change   5FU CI decreased by 10% and Oxaliplatin reduced by 15% for cycles #6-#11; Oxaliplatin dropped for cycle #12 d/t neuropathy.    10/20/2015 Imaging   CT CAP- Right hemicolectomy without evidence of  metastatic disease. 2. Previously measured ground-glass lesion in the left upper lobe has resolved. 3. Probable food debris in the stomach, simulating gastric wall thickening. Please correlate clinically. 4. 6 mm irregular nodular density in the left upper lobe, stable. Continued attention on followup exams is warranted.   01/25/2016 Procedure   Colonoscopy by Dr. Oneida Alar- Non-thrombosed external hemorrhoids found on digital rectal exam. - One 4 mm polyp in the rectum, removed with a cold biopsy forceps. Resected and retrieved. - Congested mucosa in the neo-terminal ileum. Biopsied. - Redundant colon. - Internal hemorrhoids.   01/26/2016 Pathology Results   1. Terminal ileum, biopsy - MILD ACUTE (ACTIVE) ILEITIS. - NO DYSPLASIA OR MALIGNANCY [name] - SEE COMMENT. 2. Rectum, polyp(s) - HYPERPLASTIC POLYP (X 1). - NO DYSPLASIA OR MALIGNANCY [name].   04/13/2016 Imaging   CT chest- Stable CT chest. 6 mm irregular nodule anterior left upper lobe is stable. The scattered areas of peribronchovascular micro nodularity in the lungs bilaterally are unchanged.   10/19/2016 Imaging   CT CAP: 1. Status post right hemicolectomy. No findings to suggest metastatic disease in the abdomen or pelvis. 2. Aortic atherosclerosis. 3. Additional incidental findings, as above. Aortic Atherosclerosis (ICD10-I70.0).   01/09/2018 -  Chemotherapy   The patient had palonosetron (ALOXI) injection 0.25 mg, 0.25 mg, Intravenous,  Once, 27 of 29 cycles Administration: 0.25 mg (01/09/2018), 0.25 mg (01/23/2018), 0.25 mg (02/11/2018), 0.25 mg (02/25/2018), 0.25 mg (03/12/2018), 0.25 mg (03/27/2018), 0.25 mg (04/10/2018), 0.25 mg (04/24/2018), 0.25 mg (05/08/2018), 0.25 mg (05/22/2018), 0.25 mg (06/10/2018), 0.25 mg (06/26/2018), 0.25 mg (07/10/2018), 0.25 mg (07/24/2018), 0.25 mg (08/13/2018), 0.25 mg (08/27/2018),  0.25 mg (09/10/2018), 0.25 mg (09/24/2018), 0.25 mg (10/08/2018), 0.25 mg (11/05/2018), 0.25 mg (11/19/2018), 0.25 mg  (12/04/2018), 0.25 mg (12/18/2018), 0.25 mg (01/08/2019), 0.25 mg (01/22/2019), 0.25 mg (02/04/2019), 0.25 mg (02/18/2019) irinotecan (CAMPTOSAR) 420 mg in dextrose 5 % 500 mL chemo infusion, 180 mg/m2 = 420 mg, Intravenous,  Once, 9 of 9 cycles Administration: 420 mg (01/09/2018), 420 mg (01/23/2018), 420 mg (02/11/2018), 420 mg (02/25/2018), 420 mg (03/12/2018), 420 mg (03/27/2018), 420 mg (04/10/2018), 420 mg (04/24/2018), 420 mg (05/08/2018) leucovorin 900 mg in dextrose 5 % 250 mL infusion, 944 mg, Intravenous,  Once, 27 of 29 cycles Administration: 900 mg (01/09/2018), 900 mg (01/23/2018), 900 mg (02/11/2018), 900 mg (02/25/2018), 900 mg (03/12/2018), 900 mg (03/27/2018), 900 mg (04/10/2018), 900 mg (04/24/2018), 900 mg (05/08/2018), 900 mg (06/10/2018), 900 mg (06/26/2018), 900 mg (07/10/2018), 900 mg (07/24/2018), 900 mg (08/13/2018), 900 mg (08/27/2018), 900 mg (09/10/2018), 900 mg (09/24/2018), 900 mg (10/08/2018), 900 mg (11/05/2018), 900 mg (11/19/2018), 900 mg (12/04/2018), 900 mg (12/18/2018), 900 mg (01/08/2019), 900 mg (01/22/2019), 900 mg (02/04/2019), 900 mg (02/18/2019) fluorouracil (ADRUCIL) chemo injection 950 mg, 400 mg/m2 = 950 mg, Intravenous,  Once, 27 of 29 cycles Administration: 950 mg (01/09/2018), 950 mg (01/23/2018), 950 mg (02/11/2018), 950 mg (02/25/2018), 950 mg (03/12/2018), 950 mg (03/27/2018), 950 mg (04/10/2018), 950 mg (04/24/2018), 950 mg (05/08/2018), 950 mg (05/22/2018), 950 mg (06/10/2018), 950 mg (06/26/2018), 950 mg (07/10/2018), 950 mg (07/24/2018), 950 mg (08/13/2018), 950 mg (08/27/2018), 950 mg (09/10/2018), 950 mg (09/24/2018), 950 mg (10/08/2018), 950 mg (11/05/2018), 950 mg (11/19/2018), 950 mg (12/04/2018), 950 mg (12/18/2018), 950 mg (01/08/2019), 950 mg (01/22/2019), 950 mg (02/04/2019), 950 mg (02/18/2019) fluorouracil (ADRUCIL) 5,650 mg in sodium chloride 0.9 % 137 mL chemo infusion, 2,400 mg/m2 = 5,650 mg, Intravenous, 1 Day/Dose, 27 of 29 cycles Administration: 5,650 mg (01/09/2018), 5,650 mg (01/23/2018), 5,650 mg  (02/11/2018), 5,650 mg (02/25/2018), 5,650 mg (03/12/2018), 5,650 mg (03/27/2018), 5,650 mg (04/10/2018), 5,650 mg (04/24/2018), 5,650 mg (05/08/2018), 5,650 mg (05/22/2018), 5,650 mg (06/10/2018), 5,650 mg (06/26/2018), 5,650 mg (07/10/2018), 5,650 mg (07/24/2018), 5,650 mg (08/13/2018), 5,650 mg (08/27/2018), 5,650 mg (09/10/2018), 5,650 mg (09/24/2018), 5,650 mg (10/08/2018), 5,650 mg (11/05/2018), 5,650 mg (11/19/2018), 5,650 mg (12/04/2018), 5,650 mg (12/18/2018), 5,650 mg (01/08/2019), 5,650 mg (01/22/2019), 5,650 mg (02/04/2019), 5,650 mg (02/18/2019) bevacizumab-awwb (MVASI) 500 mg in sodium chloride 0.9 % 100 mL chemo infusion, 4.7 mg/kg = 525 mg (100 % of original dose 5 mg/kg), Intravenous,  Once, 1 of 1 cycle Dose modification: 5 mg/kg (original dose 5 mg/kg, Cycle 24) Administration: 500 mg (01/08/2019) bevacizumab-bvzr (ZIRABEV) 500 mg in sodium chloride 0.9 % 100 mL chemo infusion, 5 mg/kg = 500 mg (100 % of original dose 5 mg/kg), Intravenous,  Once, 6 of 8 cycles Dose modification: 5 mg/kg (original dose 5 mg/kg, Cycle 21) Administration: 500 mg (11/19/2018), 500 mg (12/04/2018), 500 mg (12/18/2018), 500 mg (01/22/2019), 500 mg (02/04/2019), 500 mg (02/18/2019)  for chemotherapy treatment.    01/23/2018 Cancer Staging   Staging form: Colon and Rectum, AJCC 7th Edition - Pathologic: M1 - Signed by Zoila Shutter, MD on 01/23/2018   Malignant neoplasm of overlapping sites of colon ([place])  12/26/2017 Initial Diagnosis   Cancer of overlapping sites of colon metastatic to intra-abdominal lymph node ([place])   01/09/2018 -  Chemotherapy   The patient had palonosetron (ALOXI) injection 0.25 mg, 0.25 mg, Intravenous,  Once, 13 of 14 cycles Administration: 0.25 mg (01/09/2018), 0.25 mg (01/23/2018), 0.25 mg (02/11/2018), 0.25 mg (02/25/2018), 0.25 mg (03/12/2018), 0.25  mg (03/27/2018), 0.25 mg (04/10/2018), 0.25 mg (04/24/2018), 0.25 mg (05/08/2018), 0.25 mg (05/22/2018), 0.25 mg (06/10/2018), 0.25 mg (06/26/2018), 0.25 mg  (07/10/2018) irinotecan (CAMPTOSAR) 420 mg in dextrose 5 % 500 mL chemo infusion, 180 mg/m2 = 420 mg, Intravenous,  Once, 9 of 9 cycles Administration: 420 mg (01/09/2018), 420 mg (01/23/2018), 420 mg (02/11/2018), 420 mg (02/25/2018), 420 mg (03/12/2018), 420 mg (03/27/2018), 420 mg (04/10/2018), 420 mg (04/24/2018), 420 mg (05/08/2018) leucovorin 900 mg in dextrose 5 % 250 mL infusion, 944 mg, Intravenous,  Once, 13 of 14 cycles Administration: 900 mg (01/09/2018), 900 mg (01/23/2018), 900 mg (02/11/2018), 900 mg (02/25/2018), 900 mg (03/12/2018), 900 mg (03/27/2018), 900 mg (04/10/2018), 900 mg (04/24/2018), 900 mg (05/08/2018), 900 mg (06/10/2018), 900 mg (06/26/2018), 900 mg (07/10/2018) fluorouracil (ADRUCIL) chemo injection 950 mg, 400 mg/m2 = 950 mg, Intravenous,  Once, 13 of 14 cycles Administration: 950 mg (01/09/2018), 950 mg (01/23/2018), 950 mg (02/11/2018), 950 mg (02/25/2018), 950 mg (03/12/2018), 950 mg (03/27/2018), 950 mg (04/10/2018), 950 mg (04/24/2018), 950 mg (05/08/2018), 950 mg (05/22/2018), 950 mg (06/10/2018), 950 mg (06/26/2018), 950 mg (07/10/2018) fluorouracil (ADRUCIL) 5,650 mg in sodium chloride 0.9 % 137 mL chemo infusion, 2,400 mg/m2 = 5,650 mg, Intravenous, 1 Day/Dose, 13 of 14 cycles Administration: 5,650 mg (01/09/2018), 5,650 mg (01/23/2018), 5,650 mg (02/11/2018), 5,650 mg (02/25/2018), 5,650 mg (03/12/2018), 5,650 mg (03/27/2018), 5,650 mg (04/10/2018), 5,650 mg (04/24/2018), 5,650 mg (05/08/2018), 5,650 mg (05/22/2018), 5,650 mg (06/10/2018), 5,650 mg (06/26/2018), 5,650 mg (07/10/2018)  for chemotherapy treatment.    Colon cancer metastasized to multiple sites [facility])  01/16/2018 Initial Diagnosis   Colon cancer metastasized to multiple sites [facility])   01/23/2018 - 02/10/2018 Chemotherapy   The patient had panitumumab (VECTIBIX) 600 mg in sodium chloride 0.9 % 100 mL chemo infusion, 640 mg, Intravenous,  Once, 1 of 3 cycles Administration: 600 mg (01/23/2018)  for chemotherapy treatment.    03/12/2018 - 10/21/2018 Chemotherapy    The patient had bevacizumab (AVASTIN) 500 mg in sodium chloride 0.9 % 100 mL chemo infusion, 525 mg, Intravenous,  Once, 15 of 22 cycles Administration: 500 mg (03/12/2018), 500 mg (03/27/2018), 500 mg (04/10/2018), 500 mg (04/24/2018), 500 mg (06/10/2018), 500 mg (05/08/2018), 500 mg (05/22/2018), 500 mg (06/26/2018), 500 mg (07/10/2018), 500 mg (07/24/2018), 500 mg (08/13/2018), 500 mg (08/27/2018), 500 mg (09/10/2018), 500 mg (09/24/2018), 500 mg (10/08/2018)  for chemotherapy treatment.       CANCER STAGING: Cancer Staging Malignant neoplasm of transverse colon [facility]) Staging form: Colon and Rectum, AJCC 7th Edition - Pathologic stage from 03/04/2015: Stage IIIC (T4a, N2a, cM0) - Signed by Baird Cancer, PA-C on 03/04/2015 - Pathologic: M1 - Signed by Zoila Shutter, MD on 01/23/2018    INTERVAL HISTORY:  Mr. Voris 64 y.o. male seen for follow-up of metastatic colon cancer and toxicity assessment prior to his next maintenance treatment. Appetite is 25 to 50% and energy levels are 25%. Reported pain in bilateral shoulders, 8 out of 10 which is stable. Numbness in the feet is also stable. He has diarrhea 4-5 times a day and takes Imodium. Sleep problems are also stable.  REVIEW OF SYSTEMS:  Review of Systems  Gastrointestinal: Positive for diarrhea.  Neurological: Positive for numbness.  All other systems reviewed and are negative.    PAST MEDICAL/SURGICAL HISTORY:  Past Medical History:  Diagnosis Date   Abnormal stress echocardiogram    Adenocarcinoma of transverse colon ([place]) 02/10/2015   Alcohol abuse    Heavy Use up until 2010  Anxiety    Blood transfusion without reported diagnosis    Cancer of overlapping sites of colon metastatic to intra-abdominal lymph node ([place]) 12/26/2017   COPD (chronic obstructive pulmonary disease) (H[facility])    Depression    Emphysema of lung ([facility])    GERD (gastroesophageal reflux disease)    Head trauma 2001   closed head injury; coma for 4 weeks    Hypercholesterolemia    Hypertension    ING HERN W/GANGREN RECUR UNILAT/UNSPEC ING HERN 04/21/2009   Qualifier: Diagnosis of  By: Verl Blalock, MD, [place] [facility]    PTSD (post-traumatic stress disorder)    Pulmonary nodule 06/14/2016   SAH (subarachnoid hemorrhage) ([place]) 08/31/2012   SDH (subdural hematoma) ([place]) 08/31/2012   Past Surgical History:  Procedure Laterality Date   BIOPSY  01/25/2016   Procedure: BIOPSY;  Surgeon: Danie Binder, MD;  [facility];  Service: Endoscopy;;  ileum;    cardiac cath     COLONOSCOPY  2011   Dr. Oneida Alar: multiple adenomas and hyperplastic polyps   COLONOSCOPY WITH PROPOFOL N/A 01/12/2015   Procedure: COLONOSCOPY WITH PROPOFOL;  Surgeon: Danie Binder, MD;  [facility];  Service: Endoscopy;  Laterality: N/A;  1030   COLONOSCOPY WITH PROPOFOL N/A 01/25/2016   Procedure: COLONOSCOPY WITH PROPOFOL;  Surgeon: Danie Binder, MD;  [facility];  Service: Endoscopy;  Laterality: N/A;  [place]   ESOPHAGOGASTRODUODENOSCOPY (EGD) WITH PROPOFOL N/A 01/12/2015   Procedure: ESOPHAGOGASTRODUODENOSCOPY (EGD) WITH PROPOFOL;  Surgeon: Danie Binder, MD;  [facility];  Service: Endoscopy;  Laterality: N/A;   head injury surgery     HERNIA REPAIR Right 2012   Inguinal- [facility]   KIDNEY SURGERY     >30 years ago   LAPAROSCOPIC RIGHT HEMI COLECTOMY Left 02/10/2015   Procedure: LAPAROSCOPIC THEN OPEN RIGHT HEMI COLECTOMY;  Surgeon: [name], MD;  Location: [facility];  Service: General;  Laterality: Left;   LAPAROTOMY N/A 12/19/2017   Procedure: EXPLORATORY LAPAROTOMY;  Surgeon: Aviva Signs, MD;  Location: [facility];  Service: General;  Laterality: N/A;   OPEN REDUCTION INTERNAL FIXATION (ORIF) DISTAL RADIAL FRACTURE Right 10/18/2018   Procedure: OPEN REDUCTION INTERNAL FIXATION (ORIF) RIGHT DISTAL RADIAL FRACTURE;  Surgeon: [name], MD;  Location: [place];  Service:  Orthopedics;  Laterality: Right;  block in preop   OPEN REDUCTION INTERNAL FIXATION (ORIF) DISTAL RADIAL FRACTURE Right 12/20/2018   Procedure: OPEN REDUCTION INTERNAL FIXATION (ORIF) DISTAL RADIUS AND ULNA FRACTURE;  Surgeon: [name], MD;  Location: [place];  Service: Orthopedics;  Laterality: Right;  block   POLYPECTOMY  01/25/2016   Procedure: POLYPECTOMY;  Surgeon: Danie Binder, MD;  [facility];  Service: Endoscopy;;  colon   PORT-A-CATH REMOVAL N/A 02/26/2017   Procedure: MINOR REMOVAL PORT-A-CATH;  Surgeon: Aviva Signs, MD;  [facility];  Service: General;  Laterality: N/A;  Pt to arrive at [place] N/A 03/24/2015   Procedure: INSERTION PORT-A-CATH;  Surgeon: Jules Husbands, MD;  Location: [facility];  Service: General;  Laterality: N/A;   PORTACATH PLACEMENT Left 01/07/2018   Procedure: INSERTION PORT A CATH (ATTACHED CATHETER IN LEFT SUBCLAVIAN);  Surgeon: Aviva Signs, MD;  Location: [facility];  Service: General;  Laterality: Left;     SOCIAL HISTORY:  Social History   Socioeconomic History   Marital status: Divorced    Spouse name: Not on file  Number of children: 2   Years of education: 12   Highest education level: Not on file  Occupational History   Occupation: disabled/unemployed   Occupation: unemployed/Mediicaid only    Comment: NO INCOME  Tobacco Use   Smoking status: Former Smoker    Packs/day: 0.10    Years: 30.00    Pack years: 3.00    Types: Cigarettes    Start date: 02/06/1973    Quit date: 12/09/2017    Years since quitting: 1.2   Smokeless tobacco: Never Used   Tobacco comment: Quit on 12/09/2017  Substance and Sexual Activity   Alcohol use: Yes    Alcohol/week: 0.0 standard drinks    Comment: beer occ, history of ETOH abuse in remote past.    Drug use: No   Sexual activity: Never    Partners: Female    Birth control/protection: None  Other Topics Concern   Not on file    Social History Narrative   Single   Lives alone   Turned down for disability   Social Determinants of Health   Financial Resource Strain:    Difficulty of Paying Living Expenses: Not on file  Food Insecurity:    Worried About Charity fundraiser in the Last Year: Not on file   [employer] of Food in the Last Year: Not on file  Transportation Needs:    Lack of Transportation (Medical): Not on file   Lack of Transportation (Non-Medical): Not on file  Physical Activity:    Days of Exercise per Week: Not on file   Minutes of Exercise per Session: Not on file  Stress:    Feeling of Stress : Not on file  Social Connections:    Frequency of Communication with Friends and Family: Not on file   Frequency of Social Gatherings with Friends and Family: Not on file   Attends Religious Services: Not on file   Active Member of Clubs or Organizations: Not on file   Attends Archivist Meetings: Not on file   Marital Status: Not on file  Intimate Partner Violence:    Fear of Current or Ex-Partner: Not on file   Emotionally Abused: Not on file   Physically Abused: Not on file   Sexually Abused: Not on file    FAMILY HISTORY:  Family History  Problem Relation Age of Onset   Pulmonary embolism Mother    Breast cancer Mother    Cancer Mother        breast cancer   Arthritis Mother    Heart disease Father    Cancer Father 12       Leukemia   Cancer Paternal Grandfather        Lung   Ataxia Neg Hx    Chorea Neg Hx    Dementia Neg Hx    Mental retardation Neg Hx    Migraines Neg Hx    Multiple sclerosis Neg Hx    Neurofibromatosis Neg Hx    Neuropathy Neg Hx    Parkinsonism Neg Hx    Seizures Neg Hx    Stroke Neg Hx    Colon cancer Neg Hx     CURRENT MEDICATIONS:  Outpatient Encounter Medications as of 02/18/2019  Medication Sig Note   amitriptyline (ELAVIL) 25 MG tablet Take 1 tablet (25 mg total) by mouth at bedtime.     atorvastatin (LIPITOR) 20 MG tablet TAKE 1 TABLET BY MOUTH AT BEDTIME (Patient taking differently: Take 20 mg by mouth daily. )  buPROPion (WELLBUTRIN SR) 150 MG 12 hr tablet Take 150 mg by mouth every morning.     busPIRone (BUSPAR) 10 MG tablet Take 10 mg by mouth 2 (two) times daily.    fluorouracil CALGB 79024 in sodium chloride 0.9 % 150 mL Inject 5,650 mg into the vein. Over 46 hours    FLUoxetine (PROZAC) 20 MG capsule TAKE 1 CAPSULE BY MOUTH 2 TIMES A DAY (Patient taking differently: Take 20 mg by mouth 2 (two) times daily. ) 06/10/2018: 1 tab TID   gabapentin (NEURONTIN) 300 MG capsule TAKE 1 CAPSULE BY MOUTH THREE TIMES A DAY    IRINOTECAN HCL IV Inject 420 mg into the vein every 14 (fourteen) days.    LEUCOVORIN CALCIUM IV Inject 944 mg into the vein every 14 (fourteen) days.    lidocaine-prilocaine (EMLA) cream APPLY A SMALL AMOUNT OVER PORT SITE AND COVER WITH PLASTIC WRAP ONE HOUR PRIOR TO APPOINTMENT    loperamide (IMODIUM) 2 MG capsule TAKE 2 CAPSULES BY MOUTH TWICE DAILY    omeprazole (PRILOSEC) 20 MG capsule Take 20 mg by mouth every morning.     rivaroxaban (XARELTO) 20 MG TABS tablet Take 1 tablet (20 mg total) by mouth daily with supper.    Vitamin D, Ergocalciferol, (DRISDOL) 1.25 MG (50000 UT) CAPS capsule Take 50,000 Units by mouth once a week.    ondansetron (ZOFRAN) 8 MG tablet Take 1 tablet (8 mg total) by mouth 2 (two) times daily as needed for refractory nausea / vomiting. Start on day 3 after chemotherapy.    PROAIR HFA 108 (90 Base) MCG/ACT inhaler INHALE 2 PUFFS BY MOUTH EVERY 6 HOURS AS NEEDED FOR SHORTNESS OF BREATH/WHEEZING.    prochlorperazine (COMPAZINE) 10 MG tablet Take 1 tablet (10 mg total) by mouth every 6 (six) hours as needed (NAUSEA).    WELLBUTRIN XL 150 MG 24 hr tablet Take 150 mg by mouth every morning.    [DISCONTINUED] doxycycline (VIBRAMYCIN) 50 MG capsule Take 2 capsules (100 mg total) by mouth 2 (two) times daily.     Facility-Administered Encounter Medications as of 02/18/2019  Medication   sodium chloride flush (NS) 0.9 % injection 10 mL    ALLERGIES:  No Known Allergies   PHYSICAL EXAM:  ECOG Performance status: 1  There were no vitals filed for this visit. There were no vitals filed for this visit.  Physical Exam Vitals reviewed.  Constitutional:      Appearance: Normal appearance.  Cardiovascular:     Rate and Rhythm: Normal rate and regular rhythm.     Heart sounds: Normal heart sounds.  Pulmonary:     Effort: Pulmonary effort is normal.     Breath sounds: Normal breath sounds.  Abdominal:     General: There is no distension.     Palpations: Abdomen is soft. There is no mass.  Musculoskeletal:        General: No swelling.  Skin:    General: Skin is warm.  Neurological:     General: No focal deficit present.     Mental Status: He is alert and oriented to person, place, and time.  Psychiatric:        Mood and Affect: Mood normal.        Behavior: Behavior normal.      LABORATORY DATA:  I have reviewed the labs as listed.  CBC    Component Value Date/Time   WBC 8.2 02/18/2019 0914   RBC 4.50 02/18/2019 0914   HGB 14.6 02/18/2019  0914   HCT 44.4 02/18/2019 0914   PLT 224 02/18/2019 0914   MCV 98.7 02/18/2019 0914   MCH 32.4 02/18/2019 0914   MCHC 32.9 02/18/2019 0914   RDW 15.7 (H) 02/18/2019 0914   LYMPHSABS 1.3 02/18/2019 0914   MONOABS 0.9 02/18/2019 0914   EOSABS 0.1 02/18/2019 0914   BASOSABS 0.1 02/18/2019 0914   CMP Latest Ref Rng & Units 02/18/2019 02/04/2019 01/22/2019  Glucose 70 - 99 mg/dL 122(H) 89 110(H)  BUN 8 - 23 mg/dL '9 9 14  ' Creatinine 0.61 - 1.24 mg/dL 0.91 0.81 0.82  Sodium 135 - 145 mmol/L 134(L) 140 137  Potassium 3.5 - 5.1 mmol/L 3.8 4.3 4.4  Chloride 98 - 111 mmol/L 98 102 100  CO2 22 - 32 mmol/L '24 26 27  ' Calcium 8.9 - 10.3 mg/dL 9.4 9.2 9.2  Total Protein 6.5 - 8.1 g/dL 7.8 7.0 6.8  Total Bilirubin 0.3 - 1.2 mg/dL 0.9 0.5 0.7   Alkaline Phos 38 - 126 U/L 90 81 94  AST 15 - 41 U/L '15 15 15  ' ALT 0 - 44 U/L '14 13 12       ' DIAGNOSTIC IMAGING:  I have independently reviewed the scans and discussed with the patient.   I have reviewed Venita Lick LPN's note and agree with the documentation.  I personally performed a face-to-face visit, made revisions and my assessment and plan is as follows.    ASSESSMENT & PLAN:   Malignant neoplasm of transverse colon ([place]) 1.  Metastatic colon cancer, K-ras mutation positive, MSI stable: -Status post FOLFOX for 12 cycles and adjuvant setting from 03/30/2015 through 09/06/2015. - PET scan on 11/19/2017 showing peritoneal carcinomatosis, right paratracheal lymph node, nonspecific increased uptake in the right hilar region.  CEA was 12.9 on 11/16/2017. -9 cycles of FOLFIRI from 01/09/2018 through 05/08/2018 with addition of bevacizumab during cycle 5. - Maintenance 5-FU/leucovorin and bevacizumab started on 05/22/2018. - He had a fracture of the right wrist with ORIF on 10/18/2018. -CT CAP on 12/03/2018 showed slight interval increase in the nodular metastatic soft tissue along the anterior midline ventral peritoneum, largest nodular deposit component measuring 3.6 x 1.7 cm.  Also interval enlargement of metastatic nodule/lymph nodes in the central anterior abdomen, largest nodule measuring 1 cm, previously 0.5 cm.  Other mesenteric and peritoneal nodules are not significantly changed. -He recently fell again and underwent open reduction internal fixation on 12/20/2018. He broke his right ulna as well as right radius. -We reviewed his labs which are grossly within normal limits. He will proceed with his maintenance 5-FU and bevacizumab today without any dose modifications. -I'll see him back in 2 weeks for follow-up. I plan to repeat his scans prior to next visit along with a CEA level.  2.  Pulmonary embolism: -Incidental PE on CT chest on 05/20/2018.  CT angiogram confirmed bilateral  PE. -He is on Xarelto and is tolerating it very well.  3. Peripheral neuropathy: -He has pre-existing neuropathy from prior oxaliplatin. Numbness in the legs has been stable.  4. Diarrhea: -He has diarrhea, 4-5 times per day. He will continue Lomotil as needed.  5. Multiple falls: -He had multiple falls and fractures over the last several months. He denies any loss of consciousness or lightheadedness. He reports balance problems since he had a head injury many years ago. -We reviewed results of MRI of the brain dated 02/12/2019 which did not show any acute abnormalities or metastasis.     Orders placed this encounter:  Orders Placed This Encounter  Procedures   CT Abdomen Pelvis W Contrast   CT Chest W Contrast   CBC with Differential/Platelet   Comprehensive metabolic panel   Magnesium      Derek Jack, MD [place] [phone]

## 2019-02-19 ENCOUNTER — Ambulatory Visit (HOSPITAL_COMMUNITY): Payer: Medicaid Other | Admitting: Hematology

## 2019-02-19 ENCOUNTER — Ambulatory Visit (HOSPITAL_COMMUNITY): Payer: Medicaid Other

## 2019-02-19 ENCOUNTER — Other Ambulatory Visit (HOSPITAL_COMMUNITY): Payer: Medicaid Other

## 2019-02-20 ENCOUNTER — Inpatient Hospital Stay (HOSPITAL_COMMUNITY): Payer: Medicaid Other

## 2019-02-20 ENCOUNTER — Encounter (HOSPITAL_COMMUNITY): Payer: Self-pay

## 2019-02-20 ENCOUNTER — Other Ambulatory Visit: Payer: Self-pay

## 2019-02-20 VITALS — BP 131/76 | HR 95 | Temp 97.1°F | Resp 18

## 2019-02-20 DIAGNOSIS — C188 Malignant neoplasm of overlapping sites of colon: Secondary | ICD-10-CM

## 2019-02-20 DIAGNOSIS — C184 Malignant neoplasm of transverse colon: Secondary | ICD-10-CM

## 2019-02-20 DIAGNOSIS — Z5111 Encounter for antineoplastic chemotherapy: Secondary | ICD-10-CM | POA: Diagnosis not present

## 2019-02-20 MED ORDER — SODIUM CHLORIDE 0.9% FLUSH
10.0000 mL | INTRAVENOUS | Status: DC | PRN
  Administered 2019-02-20: 13:00:00 10 mL

## 2019-02-20 MED ORDER — HEPARIN SOD (PORK) LOCK FLUSH 100 UNIT/ML IV SOLN
500.0000 [IU] | Freq: Once | INTRAVENOUS | Status: AC | PRN
  Administered 2019-02-20: 500 [IU]

## 2019-02-20 NOTE — Progress Notes (Signed)
Patient in for chemo pump disconnect.  Patients port flushed without difficulty.  Good blood return noted with no bruising or swelling noted at site.  Band aid applied.  VSS with discharge and left ambulatory with no s/s of distress noted.

## 2019-02-21 ENCOUNTER — Encounter (HOSPITAL_COMMUNITY): Payer: Medicaid Other

## 2019-02-28 ENCOUNTER — Other Ambulatory Visit: Payer: Self-pay

## 2019-02-28 ENCOUNTER — Ambulatory Visit (HOSPITAL_COMMUNITY)
Admission: RE | Admit: 2019-02-28 | Discharge: 2019-02-28 | Disposition: A | Discharge status: Home / Self Care | Payer: Medicaid Other | Source: Ambulatory Visit | Attending: Hematology | Admitting: Hematology

## 2019-02-28 DIAGNOSIS — C188 Malignant neoplasm of overlapping sites of colon: Secondary | ICD-10-CM | POA: Insufficient documentation

## 2019-02-28 MED ORDER — IOHEXOL 300 MG/ML  SOLN
100.0000 mL | Freq: Once | INTRAMUSCULAR | Status: AC | PRN
  Administered 2019-02-28: 100 mL via INTRAVENOUS

## 2019-03-01 NOTE — Assessment & Plan Note (Signed)
1.  Metastatic colon cancer, K-ras mutation positive, MSI stable: -Status post FOLFOX for 12 cycles and adjuvant setting from 03/30/2015 through 09/06/2015. - PET scan on 11/19/2017 showing peritoneal carcinomatosis, right paratracheal lymph node, nonspecific increased uptake in the right hilar region.  CEA was 12.9 on 11/16/2017. -9 cycles of FOLFIRI from 01/09/2018 through 05/08/2018 with addition of bevacizumab during cycle 5. - Maintenance 5-FU/leucovorin and bevacizumab started on 05/22/2018. - He had a fracture of the right wrist with ORIF on 10/18/2018. -CT CAP on 12/03/2018 showed slight interval increase in the nodular metastatic soft tissue along the anterior midline ventral peritoneum, largest nodular deposit component measuring 3.6 x 1.7 cm.  Also interval enlargement of metastatic nodule/lymph nodes in the central anterior abdomen, largest nodule measuring 1 cm, previously 0.5 cm.  Other mesenteric and peritoneal nodules are not significantly changed. -He recently fell again and underwent open reduction internal fixation on 12/20/2018. He broke his right ulna as well as right radius. -We reviewed his labs which are grossly within normal limits. He will proceed with his maintenance 5-FU and bevacizumab today without any dose modifications. -I'll see him back in 2 weeks for follow-up. I plan to repeat his scans prior to next visit along with a CEA level.  2.  Pulmonary embolism: -Incidental PE on CT chest on 05/20/2018.  CT angiogram confirmed bilateral PE. -He is on Xarelto and is tolerating it very well.  3. Peripheral neuropathy: -He has pre-existing neuropathy from prior oxaliplatin. Numbness in the legs has been stable.  4. Diarrhea: -He has diarrhea, 4-5 times per day. He will continue Lomotil as needed.  5. Multiple falls: -He had multiple falls and fractures over the last several months. He denies any loss of consciousness or lightheadedness. He reports balance problems since he had  a head injury many years ago. -We reviewed results of MRI of the brain dated 02/12/2019 which did not show any acute abnormalities or metastasis.

## 2019-03-04 ENCOUNTER — Inpatient Hospital Stay (HOSPITAL_COMMUNITY): Payer: Medicaid Other

## 2019-03-04 ENCOUNTER — Other Ambulatory Visit: Payer: Self-pay

## 2019-03-04 ENCOUNTER — Encounter (HOSPITAL_COMMUNITY): Payer: Self-pay | Admitting: Hematology

## 2019-03-04 ENCOUNTER — Inpatient Hospital Stay (HOSPITAL_BASED_OUTPATIENT_CLINIC_OR_DEPARTMENT_OTHER): Payer: Medicaid Other | Admitting: Hematology

## 2019-03-04 VITALS — BP 145/92 | HR 86 | Temp 96.9°F | Resp 18 | Wt 224.0 lb

## 2019-03-04 DIAGNOSIS — C188 Malignant neoplasm of overlapping sites of colon: Secondary | ICD-10-CM

## 2019-03-04 DIAGNOSIS — C184 Malignant neoplasm of transverse colon: Secondary | ICD-10-CM | POA: Diagnosis not present

## 2019-03-04 DIAGNOSIS — Z5111 Encounter for antineoplastic chemotherapy: Secondary | ICD-10-CM | POA: Diagnosis not present

## 2019-03-04 LAB — COMPREHENSIVE METABOLIC PANEL
ALT: 10 U/L (ref 0–44)
AST: 13 U/L — ABNORMAL LOW (ref 15–41)
Albumin: 3.5 g/dL (ref 3.5–5.0)
Alkaline Phosphatase: 86 U/L (ref 38–126)
Anion gap: 8 (ref 5–15)
BUN: 8 mg/dL (ref 8–23)
CO2: 28 mmol/L (ref 22–32)
Calcium: 9.3 mg/dL (ref 8.9–10.3)
Chloride: 100 mmol/L (ref 98–111)
Creatinine, Ser: 0.82 mg/dL (ref 0.61–1.24)
GFR calc Af Amer: >60 mL/min (ref >60)
GFR calc non Af Amer: >60 mL/min (ref >60)
Glucose, Bld: 100 mg/dL — ABNORMAL HIGH (ref 70–99)
Potassium: 4.1 mmol/L (ref 3.5–5.1)
Sodium: 136 mmol/L (ref 135–145)
Total Bilirubin: 0.3 mg/dL (ref 0.3–1.2)
Total Protein: 6.7 g/dL (ref 6.5–8.1)

## 2019-03-04 LAB — CBC WITH DIFFERENTIAL/PLATELET
Abs Immature Granulocytes: 0.03 10*3/uL (ref 0.00–0.07)
Basophils Absolute: 0 10*3/uL (ref 0.0–0.1)
Basophils Relative: 1 %
Eosinophils Absolute: 0.1 10*3/uL (ref 0.0–0.5)
Eosinophils Relative: 2 %
HCT: 40.6 % (ref 39.0–52.0)
Hemoglobin: 13.1 g/dL (ref 13.0–17.0)
Immature Granulocytes: 1 %
Lymphocytes Relative: 17 %
Lymphs Abs: 1.1 10*3/uL (ref 0.7–4.0)
MCH: 32.9 pg (ref 26.0–34.0)
MCHC: 32.3 g/dL (ref 30.0–36.0)
MCV: 102 fL — ABNORMAL HIGH (ref 80.0–100.0)
Monocytes Absolute: 0.8 10*3/uL (ref 0.1–1.0)
Monocytes Relative: 12 %
Neutro Abs: 4.2 10*3/uL (ref 1.7–7.7)
Neutrophils Relative %: 67 %
Platelets: 160 10*3/uL (ref 150–400)
RBC: 3.98 MIL/uL — ABNORMAL LOW (ref 4.22–5.81)
RDW: 16.2 % — ABNORMAL HIGH (ref 11.5–15.5)
WBC: 6.2 10*3/uL (ref 4.0–10.5)
nRBC: 0 % (ref 0.0–0.2)

## 2019-03-04 LAB — MAGNESIUM: Magnesium: 1.7 mg/dL (ref 1.7–2.4)

## 2019-03-04 MED ORDER — PALONOSETRON HCL INJECTION 0.25 MG/5ML
0.2500 mg | Freq: Once | INTRAVENOUS | Status: AC
  Administered 2019-03-04: 0.25 mg via INTRAVENOUS

## 2019-03-04 MED ORDER — SODIUM CHLORIDE 0.9 % IV SOLN
900.0000 mg | Freq: Once | INTRAVENOUS | Status: AC
  Administered 2019-03-04: 900 mg via INTRAVENOUS
  Filled 2019-03-04: qty 10

## 2019-03-04 MED ORDER — SODIUM CHLORIDE 0.9 % IV SOLN
2400.0000 mg/m2 | INTRAVENOUS | Status: DC
  Administered 2019-03-04: 5650 mg via INTRAVENOUS
  Filled 2019-03-04: qty 113

## 2019-03-04 MED ORDER — SODIUM CHLORIDE 0.9 % IV SOLN
10.0000 mg | Freq: Once | INTRAVENOUS | Status: AC
  Administered 2019-03-04: 10 mg via INTRAVENOUS
  Filled 2019-03-04: qty 10

## 2019-03-04 MED ORDER — SODIUM CHLORIDE 0.9% FLUSH
10.0000 mL | INTRAVENOUS | Status: DC | PRN
  Administered 2019-03-04: 10 mL

## 2019-03-04 MED ORDER — SODIUM CHLORIDE 0.9 % IV SOLN: Freq: Once | INTRAVENOUS | Status: AC

## 2019-03-04 MED ORDER — ATROPINE SULFATE 1 MG/ML IJ SOLN
0.5000 mg | Freq: Once | INTRAMUSCULAR | Status: AC
  Administered 2019-03-04: 0.5 mg via INTRAVENOUS
  Filled 2019-03-04: qty 1

## 2019-03-04 MED ORDER — SODIUM CHLORIDE 0.9 % IV SOLN
5.0000 mg/kg | Freq: Once | INTRAVENOUS | Status: AC
  Administered 2019-03-04: 500 mg via INTRAVENOUS
  Filled 2019-03-04: qty 4

## 2019-03-04 MED ORDER — FLUOROURACIL CHEMO INJECTION 2.5 GM/50ML
400.0000 mg/m2 | Freq: Once | INTRAVENOUS | Status: AC
  Administered 2019-03-04: 950 mg via INTRAVENOUS
  Filled 2019-03-04: qty 19

## 2019-03-04 NOTE — Progress Notes (Signed)
 [facility] [address] [place], [place] [address]   CLINIC:  Medical Oncology/Hematology  PCP:  [facility]llc, [facility] [address] [place] [address] [phone]   REASON FOR VISIT:  Follow-up for metastatic colon cancer   BRIEF ONCOLOGIC HISTORY:  Oncology History Overview Note  Stage IIIC (T4AN2AM0) adenocarcinoma of transverse colon, diagnosed on colonoscopy by Dr. Fields on 01/12/2015 followed by definitive surgery by Dr. Charles Woodham with right hemicolectomy on 02/12/2015.  He then underwent FOLFOX x 12 cycles in the adjuvant setting (03/30/2015- 09/06/2015).   Malignant neoplasm of transverse colon (HCC)  01/12/2015 Pathologic Stage   Colon, biopsy, distal transverse - TUBULOVILLOUS ADENOMA WITH HIGH GRADE DYSPLASIA.   01/12/2015 Procedure   Colonoscopy by Dr. Fields.   01/12/2015 Tumor Marker   CEA: 6.6 (H)    01/18/2015 Imaging   CT abd/pelvis- Apple-core lesion identified in the mid transverse colon without obstruction. No evidence for lymphadenopathy in the gastrohepatic ligament or omentum.  Stable 8 mm hypo attenuating lesion in the left liver, likely a cyst.   02/10/2015 Initial Diagnosis   Adenocarcinoma of transverse colon (HCC)   02/12/2015 Definitive Surgery   Charles Woodham, Extended right hemicolectomy    02/12/2015 Pathology Results   Mucinous adenocarcinoma with penetration of visceral peritoneum, 4/19 lymph nodes for metastatic disease, negative resection margins, with LVI and perineural invasion   03/30/2015 - 09/06/2015 Chemotherapy   FOLFOX x 12 cycles   05/25/2015 Treatment Plan Change   5 FU bolus discontinued for cycle #5   06/08/2015 Treatment Plan Change   Treatment deferred x 1 week   06/15/2015 Treatment Plan Change   5FU CI decreased by 10% and Oxaliplatin reduced by 15% for cycles #6-#11; Oxaliplatin dropped for cycle #12 d/t neuropathy.    10/20/2015 Imaging   CT CAP- Right hemicolectomy without evidence of  metastatic disease. 2. Previously measured ground-glass lesion in the left upper lobe has resolved. 3. Probable food debris in the stomach, simulating gastric wall thickening. Please correlate clinically. 4. 6 mm irregular nodular density in the left upper lobe, stable. Continued attention on followup exams is warranted.   01/25/2016 Procedure   Colonoscopy by Dr. Fields- Non-thrombosed external hemorrhoids found on digital rectal exam. - One 4 mm polyp in the rectum, removed with a cold biopsy forceps. Resected and retrieved. - Congested mucosa in the neo-terminal ileum. Biopsied. - Redundant colon. - Internal hemorrhoids.   01/26/2016 Pathology Results   1. Terminal ileum, biopsy - MILD ACUTE (ACTIVE) ILEITIS. - NO DYSPLASIA OR MALIGNANCY IDENTIFIED - SEE COMMENT. 2. Rectum, polyp(s) - HYPERPLASTIC POLYP (X 1). - NO DYSPLASIA OR MALIGNANCY IDENTIFIED.   04/13/2016 Imaging   CT chest- Stable CT chest. 6 mm irregular nodule anterior left upper lobe is stable. The scattered areas of peribronchovascular micro nodularity in the lungs bilaterally are unchanged.   10/19/2016 Imaging   CT CAP: 1. Status post right hemicolectomy. No findings to suggest metastatic disease in the abdomen or pelvis. 2. Aortic atherosclerosis. 3. Additional incidental findings, as above. Aortic Atherosclerosis (ICD10-I70.0).   01/09/2018 -  Chemotherapy   The patient had palonosetron (ALOXI) injection 0.25 mg, 0.25 mg, Intravenous,  Once, 28 of 31 cycles Administration: 0.25 mg (01/09/2018), 0.25 mg (01/23/2018), 0.25 mg (02/11/2018), 0.25 mg (02/25/2018), 0.25 mg (03/12/2018), 0.25 mg (03/27/2018), 0.25 mg (04/10/2018), 0.25 mg (04/24/2018), 0.25 mg (05/08/2018), 0.25 mg (05/22/2018), 0.25 mg (06/10/2018), 0.25 mg (06/26/2018), 0.25 mg (07/10/2018), 0.25 mg (07/24/2018), 0.25 mg (08/13/2018), 0.25 mg (08/27/2018),   0.25 mg (09/10/2018), 0.25 mg (09/24/2018), 0.25 mg (10/08/2018), 0.25 mg (11/05/2018), 0.25 mg (11/19/2018), 0.25 mg  (12/04/2018), 0.25 mg (12/18/2018), 0.25 mg (01/08/2019), 0.25 mg (01/22/2019), 0.25 mg (02/04/2019), 0.25 mg (02/18/2019), 0.25 mg (03/04/2019) irinotecan (CAMPTOSAR) 420 mg in dextrose 5 % 500 mL chemo infusion, 180 mg/m2 = 420 mg, Intravenous,  Once, 9 of 9 cycles Administration: 420 mg (01/09/2018), 420 mg (01/23/2018), 420 mg (02/11/2018), 420 mg (02/25/2018), 420 mg (03/12/2018), 420 mg (03/27/2018), 420 mg (04/10/2018), 420 mg (04/24/2018), 420 mg (05/08/2018) leucovorin 900 mg in dextrose 5 % 250 mL infusion, 944 mg, Intravenous,  Once, 28 of 31 cycles Administration: 900 mg (01/09/2018), 900 mg (01/23/2018), 900 mg (02/11/2018), 900 mg (02/25/2018), 900 mg (03/12/2018), 900 mg (03/27/2018), 900 mg (04/10/2018), 900 mg (04/24/2018), 900 mg (05/08/2018), 900 mg (06/10/2018), 900 mg (06/26/2018), 900 mg (07/10/2018), 900 mg (07/24/2018), 900 mg (08/13/2018), 900 mg (08/27/2018), 900 mg (09/10/2018), 900 mg (09/24/2018), 900 mg (10/08/2018), 900 mg (11/05/2018), 900 mg (11/19/2018), 900 mg (12/04/2018), 900 mg (12/18/2018), 900 mg (01/08/2019), 900 mg (01/22/2019), 900 mg (02/04/2019), 900 mg (02/18/2019), 900 mg (03/04/2019) fluorouracil (ADRUCIL) chemo injection 950 mg, 400 mg/m2 = 950 mg, Intravenous,  Once, 28 of 31 cycles Administration: 950 mg (01/09/2018), 950 mg (01/23/2018), 950 mg (02/11/2018), 950 mg (02/25/2018), 950 mg (03/12/2018), 950 mg (03/27/2018), 950 mg (04/10/2018), 950 mg (04/24/2018), 950 mg (05/08/2018), 950 mg (05/22/2018), 950 mg (06/10/2018), 950 mg (06/26/2018), 950 mg (07/10/2018), 950 mg (07/24/2018), 950 mg (08/13/2018), 950 mg (08/27/2018), 950 mg (09/10/2018), 950 mg (09/24/2018), 950 mg (10/08/2018), 950 mg (11/05/2018), 950 mg (11/19/2018), 950 mg (12/04/2018), 950 mg (12/18/2018), 950 mg (01/08/2019), 950 mg (01/22/2019), 950 mg (02/04/2019), 950 mg (02/18/2019), 950 mg (03/04/2019) fluorouracil (ADRUCIL) 5,650 mg in sodium chloride 0.9 % 137 mL chemo infusion, 2,400 mg/m2 = 5,650 mg, Intravenous, 1 Day/Dose, 28 of 31 cycles Administration:  5,650 mg (01/09/2018), 5,650 mg (01/23/2018), 5,650 mg (02/11/2018), 5,650 mg (02/25/2018), 5,650 mg (03/12/2018), 5,650 mg (03/27/2018), 5,650 mg (04/10/2018), 5,650 mg (04/24/2018), 5,650 mg (05/08/2018), 5,650 mg (05/22/2018), 5,650 mg (06/10/2018), 5,650 mg (06/26/2018), 5,650 mg (07/10/2018), 5,650 mg (07/24/2018), 5,650 mg (08/13/2018), 5,650 mg (08/27/2018), 5,650 mg (09/10/2018), 5,650 mg (09/24/2018), 5,650 mg (10/08/2018), 5,650 mg (11/05/2018), 5,650 mg (11/19/2018), 5,650 mg (12/04/2018), 5,650 mg (12/18/2018), 5,650 mg (01/08/2019), 5,650 mg (01/22/2019), 5,650 mg (02/04/2019), 5,650 mg (02/18/2019), 5,650 mg (03/04/2019) bevacizumab-awwb (MVASI) 500 mg in sodium chloride 0.9 % 100 mL chemo infusion, 4.7 mg/kg = 525 mg (100 % of original dose 5 mg/kg), Intravenous,  Once, 1 of 1 cycle Dose modification: 5 mg/kg (original dose 5 mg/kg, Cycle 24) Administration: 500 mg (01/08/2019) bevacizumab-bvzr (ZIRABEV) 500 mg in sodium chloride 0.9 % 100 mL chemo infusion, 5 mg/kg = 500 mg (100 % of original dose 5 mg/kg), Intravenous,  Once, 7 of 10 cycles Dose modification: 5 mg/kg (original dose 5 mg/kg, Cycle 21) Administration: 500 mg (11/19/2018), 500 mg (12/04/2018), 500 mg (12/18/2018), 500 mg (01/22/2019), 500 mg (02/04/2019), 500 mg (02/18/2019), 500 mg (03/04/2019)  for chemotherapy treatment.    01/23/2018 Cancer Staging   Staging form: Colon and Rectum, AJCC 7th Edition - Pathologic: M1 - Signed by Higgs, Vetta, MD on 01/23/2018   Malignant neoplasm of overlapping sites of colon (HCC)  12/26/2017 Initial Diagnosis   Cancer of overlapping sites of colon metastatic to intra-abdominal lymph node (HCC)   01/09/2018 -  Chemotherapy   The patient had palonosetron (ALOXI) injection 0.25 mg, 0.25 mg, Intravenous,  Once, 13 of 14 cycles Administration: 0.25   mg (01/09/2018), 0.25 mg (01/23/2018), 0.25 mg (02/11/2018), 0.25 mg (02/25/2018), 0.25 mg (03/12/2018), 0.25 mg (03/27/2018), 0.25 mg (04/10/2018), 0.25 mg (04/24/2018), 0.25 mg  (05/08/2018), 0.25 mg (05/22/2018), 0.25 mg (06/10/2018), 0.25 mg (06/26/2018), 0.25 mg (07/10/2018) irinotecan (CAMPTOSAR) 420 mg in dextrose 5 % 500 mL chemo infusion, 180 mg/m2 = 420 mg, Intravenous,  Once, 9 of 9 cycles Administration: 420 mg (01/09/2018), 420 mg (01/23/2018), 420 mg (02/11/2018), 420 mg (02/25/2018), 420 mg (03/12/2018), 420 mg (03/27/2018), 420 mg (04/10/2018), 420 mg (04/24/2018), 420 mg (05/08/2018) leucovorin 900 mg in dextrose 5 % 250 mL infusion, 944 mg, Intravenous,  Once, 13 of 14 cycles Administration: 900 mg (01/09/2018), 900 mg (01/23/2018), 900 mg (02/11/2018), 900 mg (02/25/2018), 900 mg (03/12/2018), 900 mg (03/27/2018), 900 mg (04/10/2018), 900 mg (04/24/2018), 900 mg (05/08/2018), 900 mg (06/10/2018), 900 mg (06/26/2018), 900 mg (07/10/2018) fluorouracil (ADRUCIL) chemo injection 950 mg, 400 mg/m2 = 950 mg, Intravenous,  Once, 13 of 14 cycles Administration: 950 mg (01/09/2018), 950 mg (01/23/2018), 950 mg (02/11/2018), 950 mg (02/25/2018), 950 mg (03/12/2018), 950 mg (03/27/2018), 950 mg (04/10/2018), 950 mg (04/24/2018), 950 mg (05/08/2018), 950 mg (05/22/2018), 950 mg (06/10/2018), 950 mg (06/26/2018), 950 mg (07/10/2018) fluorouracil (ADRUCIL) 5,650 mg in sodium chloride 0.9 % 137 mL chemo infusion, 2,400 mg/m2 = 5,650 mg, Intravenous, 1 Day/Dose, 13 of 14 cycles Administration: 5,650 mg (01/09/2018), 5,650 mg (01/23/2018), 5,650 mg (02/11/2018), 5,650 mg (02/25/2018), 5,650 mg (03/12/2018), 5,650 mg (03/27/2018), 5,650 mg (04/10/2018), 5,650 mg (04/24/2018), 5,650 mg (05/08/2018), 5,650 mg (05/22/2018), 5,650 mg (06/10/2018), 5,650 mg (06/26/2018), 5,650 mg (07/10/2018)  for chemotherapy treatment.    Colon cancer metastasized to multiple sites [facility])  01/16/2018 Initial Diagnosis   Colon cancer metastasized to multiple sites [facility])   01/23/2018 - 02/10/2018 Chemotherapy   The patient had panitumumab (VECTIBIX) 600 mg in sodium chloride 0.9 % 100 mL chemo infusion, 640 mg, Intravenous,  Once, 1 of 3 cycles Administration: 600 mg  (01/23/2018)  for chemotherapy treatment.    03/12/2018 - 10/21/2018 Chemotherapy   The patient had bevacizumab (AVASTIN) 500 mg in sodium chloride 0.9 % 100 mL chemo infusion, 525 mg, Intravenous,  Once, 15 of 22 cycles Administration: 500 mg (03/12/2018), 500 mg (03/27/2018), 500 mg (04/10/2018), 500 mg (04/24/2018), 500 mg (06/10/2018), 500 mg (05/08/2018), 500 mg (05/22/2018), 500 mg (06/26/2018), 500 mg (07/10/2018), 500 mg (07/24/2018), 500 mg (08/13/2018), 500 mg (08/27/2018), 500 mg (09/10/2018), 500 mg (09/24/2018), 500 mg (10/08/2018)  for chemotherapy treatment.       CANCER STAGING: Cancer Staging Malignant neoplasm of transverse colon [facility]) Staging form: Colon and Rectum, AJCC 7th Edition - Pathologic stage from 03/04/2015: Stage IIIC (T4a, N2a, cM0) - Signed by Baird Cancer, PA-C on 03/04/2015 - Pathologic: M1 - Signed by Zoila Shutter, MD on 01/23/2018    INTERVAL HISTORY:  Mr. Avetisyan 64 y.o. male seen for follow-up of metastatic colon cancer and toxicity assessment prior to his next maintenance treatment.  He had CT scans done on 02/28/2019.  He apparently ran into a door frame last Friday and lost balance.  Denies any bleeding per rectum or melena.  No nosebleeds or hematuria reported.  Appetite is 50% and energy levels are 25%.  Right shoulder pain is reported.  REVIEW OF SYSTEMS:  Review of Systems  Gastrointestinal: Positive for diarrhea.  Neurological: Positive for numbness.  All other systems reviewed and are negative.    PAST MEDICAL/SURGICAL HISTORY:  Past Medical History:  Diagnosis Date  . Abnormal stress echocardiogram   .  Adenocarcinoma of transverse colon (HCC) 02/10/2015  . Alcohol abuse    Heavy Use up until 2010  . Anxiety   . Blood transfusion without reported diagnosis   . Cancer of overlapping sites of colon metastatic to intra-abdominal lymph node (HCC) 12/26/2017  . COPD (chronic obstructive pulmonary disease) (HCC)   . Depression   . Emphysema of lung (HCC)   .  GERD (gastroesophageal reflux disease)   . Head trauma 2001   closed head injury; coma for 4 weeks  . Hypercholesterolemia   . Hypertension   . ING HERN W/GANGREN RECUR UNILAT/UNSPEC ING HERN 04/21/2009   Qualifier: Diagnosis of  By: Wall, MD, FACC, Thomas C   . PTSD (post-traumatic stress disorder)   . Pulmonary nodule 06/14/2016  . SAH (subarachnoid hemorrhage) (HCC) 08/31/2012  . SDH (subdural hematoma) (HCC) 08/31/2012   Past Surgical History:  Procedure Laterality Date  . BIOPSY  01/25/2016   Procedure: BIOPSY;  Surgeon: Sandi L Fields, MD;  [facility];  Service: Endoscopy;;  ileum;   . cardiac cath    . COLONOSCOPY  2011   Dr. Fields: multiple adenomas and hyperplastic polyps  . COLONOSCOPY WITH PROPOFOL N/A 01/12/2015   Procedure: COLONOSCOPY WITH PROPOFOL;  Surgeon: Sandi L Fields, MD;  [facility];  Service: Endoscopy;  Laterality: N/A;  1030  . COLONOSCOPY WITH PROPOFOL N/A 01/25/2016   Procedure: COLONOSCOPY WITH PROPOFOL;  Surgeon: Sandi L Fields, MD;  [facility];  Service: Endoscopy;  Laterality: N/A;  1230  . CRANIOTOMY  2001  . ESOPHAGOGASTRODUODENOSCOPY (EGD) WITH PROPOFOL N/A 01/12/2015   Procedure: ESOPHAGOGASTRODUODENOSCOPY (EGD) WITH PROPOFOL;  Surgeon: Sandi L Fields, MD;  [facility];  Service: Endoscopy;  Laterality: N/A;  . head injury surgery    . HERNIA REPAIR Right 2012   Inguinal- [place]  . KIDNEY SURGERY     >30 years ago  . LAPAROSCOPIC RIGHT HEMI COLECTOMY Left 02/10/2015   Procedure: LAPAROSCOPIC THEN OPEN RIGHT HEMI COLECTOMY;  Surgeon: Charles Woodham, MD;  Location: [facility];  Service: General;  Laterality: Left;  . LAPAROTOMY N/A 12/19/2017   Procedure: EXPLORATORY LAPAROTOMY;  Surgeon: Jenkins, Mark, MD;  Location: [facility];  Service: General;  Laterality: N/A;  . OPEN REDUCTION INTERNAL FIXATION (ORIF) DISTAL RADIAL FRACTURE Right 10/18/2018   Procedure: OPEN REDUCTION INTERNAL FIXATION (ORIF) RIGHT  DISTAL RADIAL FRACTURE;  Surgeon: Kuzma, Kevin, MD;  Location: [facility];  Service: Orthopedics;  Laterality: Right;  block in preop  . OPEN REDUCTION INTERNAL FIXATION (ORIF) DISTAL RADIAL FRACTURE Right 12/20/2018   Procedure: OPEN REDUCTION INTERNAL FIXATION (ORIF) DISTAL RADIUS AND ULNA FRACTURE;  Surgeon: Kuzma, Kevin, MD;  Location: [facility];  Service: Orthopedics;  Laterality: Right;  block  . POLYPECTOMY  01/25/2016   Procedure: POLYPECTOMY;  Surgeon: Sandi L Fields, MD;  [facility];  Service: Endoscopy;;  colon  . PORT-A-CATH REMOVAL N/A 02/26/2017   Procedure: MINOR REMOVAL PORT-A-CATH;  Surgeon: Jenkins, Mark, MD;  Location[facility];  Service: General;  Laterality: N/A;  Pt to arrive at 8:00am  . PORTACATH PLACEMENT N/A 03/24/2015   Procedure: INSERTION PORT-A-CATH;  Surgeon: Diego F Pabon, MD;  Location: [facility];  Service: General;  Laterality: N/A;  . PORTACATH PLACEMENT Left 01/07/2018   Procedure: INSERTION PORT A CATH (ATTACHED CATHETER IN LEFT SUBCLAVIAN);  Surgeon: Jenkins, Mark, MD;  Location: [facility];  Service: General;  Laterality: Left;     SOCIAL HISTORY:    Social History   Socioeconomic History  . Marital status: Divorced    Spouse name: Not on file  . Number of children: 2  . Years of education: 31  . Highest education level: Not on file  Occupational History  . Occupation: disabled/unemployed  . Occupation: unemployed/Mediicaid only    Comment: NO INCOME  Tobacco Use  . Smoking status: Former Smoker    Packs/day: 0.10    Years: 30.00    Pack years: 3.00    Types: Cigarettes    Start date: 02/06/1973    Quit date: 12/09/2017    Years since quitting: 1.2  . Smokeless tobacco: Never Used  . Tobacco comment: Quit on 12/09/2017  Substance and Sexual Activity  . Alcohol use: Yes    Alcohol/week: 0.0 standard drinks    Comment: beer occ, history of ETOH abuse in remote past.   . Drug use: No  . Sexual activity: Never      Partners: Female    Birth control/protection: None  Other Topics Concern  . Not on file  Social History Narrative   Single   Lives alone   Turned down for disability   Social Determinants of Health   Financial Resource Strain:   . Difficulty of Paying Living Expenses: Not on file  Food Insecurity:   . Worried About Charity fundraiser in the Last Year: Not on file  . Ran Out of Food in the Last Year: Not on file  Transportation Needs:   . Lack of Transportation (Medical): Not on file  . Lack of Transportation (Non-Medical): Not on file  Physical Activity:   . Days of Exercise per Week: Not on file  . Minutes of Exercise per Session: Not on file  Stress:   . Feeling of Stress : Not on file  Social Connections:   . Frequency of Communication with Friends and Family: Not on file  . Frequency of Social Gatherings with Friends and Family: Not on file  . Attends Religious Services: Not on file  . Active Member of Clubs or Organizations: Not on file  . Attends Archivist Meetings: Not on file  . Marital Status: Not on file  Intimate Partner Violence:   . Fear of Current or Ex-Partner: Not on file  . Emotionally Abused: Not on file  . Physically Abused: Not on file  . Sexually Abused: Not on file    FAMILY HISTORY:  Family History  Problem Relation Age of Onset  . Pulmonary embolism Mother   . Breast cancer Mother   . Cancer Mother        breast cancer  . Arthritis Mother   . Heart disease Father   . Cancer Father 78       Leukemia  . Cancer Paternal Grandfather        Lung  . Ataxia Neg Hx   . Chorea Neg Hx   . Dementia Neg Hx   . Mental retardation Neg Hx   . Migraines Neg Hx   . Multiple sclerosis Neg Hx   . Neurofibromatosis Neg Hx   . Neuropathy Neg Hx   . Parkinsonism Neg Hx   . Seizures Neg Hx   . Stroke Neg Hx   . Colon cancer Neg Hx     CURRENT MEDICATIONS:  Outpatient Encounter Medications as of 03/04/2019  Medication Sig Note  .  amitriptyline (ELAVIL) 25 MG tablet Take 1 tablet (25 mg total) by mouth at bedtime.   Marland Kitchen atorvastatin (  LIPITOR) 20 MG tablet TAKE 1 TABLET BY MOUTH AT BEDTIME (Patient taking differently: Take 20 mg by mouth daily. )   . buPROPion (WELLBUTRIN SR) 150 MG 12 hr tablet Take 150 mg by mouth every morning.    . busPIRone (BUSPAR) 10 MG tablet Take 10 mg by mouth 2 (two) times daily.   . fluorouracil CALGB 80702 in sodium chloride 0.9 % 150 mL Inject 5,650 mg into the vein. Over 46 hours   . FLUoxetine (PROZAC) 20 MG capsule TAKE 1 CAPSULE BY MOUTH 2 TIMES A DAY (Patient taking differently: Take 20 mg by mouth 2 (two) times daily. ) 06/10/2018: 1 tab TID  . gabapentin (NEURONTIN) 300 MG capsule TAKE 1 CAPSULE BY MOUTH THREE TIMES A DAY   . IRINOTECAN HCL IV Inject 420 mg into the vein every 14 (fourteen) days.   . LEUCOVORIN CALCIUM IV Inject 944 mg into the vein every 14 (fourteen) days.   . lidocaine-prilocaine (EMLA) cream APPLY A SMALL AMOUNT OVER PORT SITE AND COVER WITH PLASTIC WRAP ONE HOUR PRIOR TO APPOINTMENT   . loperamide (IMODIUM) 2 MG capsule TAKE 2 CAPSULES BY MOUTH TWICE DAILY   . omeprazole (PRILOSEC) 20 MG capsule Take 20 mg by mouth every morning.    . ondansetron (ZOFRAN) 8 MG tablet Take 1 tablet (8 mg total) by mouth 2 (two) times daily as needed for refractory nausea / vomiting. Start on day 3 after chemotherapy.   . PROAIR HFA 108 (90 Base) MCG/ACT inhaler INHALE 2 PUFFS BY MOUTH EVERY 6 HOURS AS NEEDED FOR SHORTNESS OF BREATH/WHEEZING.   . prochlorperazine (COMPAZINE) 10 MG tablet Take 1 tablet (10 mg total) by mouth every 6 (six) hours as needed (NAUSEA).   . rivaroxaban (XARELTO) 20 MG TABS tablet Take 1 tablet (20 mg total) by mouth daily with supper.   . Vitamin D, Ergocalciferol, (DRISDOL) 1.25 MG (50000 UT) CAPS capsule Take 50,000 Units by mouth once a week.   . WELLBUTRIN XL 150 MG 24 hr tablet Take 150 mg by mouth every morning.    Facility-Administered Encounter  Medications as of 03/04/2019  Medication  . sodium chloride flush (NS) 0.9 % injection 10 mL    ALLERGIES:  No Known Allergies   PHYSICAL EXAM:  ECOG Performance status: 1  There were no vitals filed for this visit. There were no vitals filed for this visit.  Physical Exam Vitals reviewed.  Constitutional:      Appearance: Normal appearance.  Cardiovascular:     Rate and Rhythm: Normal rate and regular rhythm.     Heart sounds: Normal heart sounds.  Pulmonary:     Effort: Pulmonary effort is normal.     Breath sounds: Normal breath sounds.  Abdominal:     General: There is no distension.     Palpations: Abdomen is soft. There is no mass.  Musculoskeletal:        General: No swelling.  Skin:    General: Skin is warm.  Neurological:     General: No focal deficit present.     Mental Status: He is alert and oriented to person, place, and time.  Psychiatric:        Mood and Affect: Mood normal.        Behavior: Behavior normal.      LABORATORY DATA:  I have reviewed the labs as listed.  CBC    Component Value Date/Time   WBC 6.2 03/04/2019 0845   RBC 3.98 (L) 03/04/2019 0845     HGB 13.1 03/04/2019 0845   HCT 40.6 03/04/2019 0845   PLT 160 03/04/2019 0845   MCV 102.0 (H) 03/04/2019 0845   MCH 32.9 03/04/2019 0845   MCHC 32.3 03/04/2019 0845   RDW 16.2 (H) 03/04/2019 0845   LYMPHSABS 1.1 03/04/2019 0845   MONOABS 0.8 03/04/2019 0845   EOSABS 0.1 03/04/2019 0845   BASOSABS 0.0 03/04/2019 0845   CMP Latest Ref Rng & Units 03/04/2019 02/18/2019 02/04/2019  Glucose 70 - 99 mg/dL 100(H) 122(H) 89  BUN 8 - 23 mg/dL 8 9 9  Creatinine 0.61 - 1.24 mg/dL 0.82 0.91 0.81  Sodium 135 - 145 mmol/L 136 134(L) 140  Potassium 3.5 - 5.1 mmol/L 4.1 3.8 4.3  Chloride 98 - 111 mmol/L 100 98 102  CO2 22 - 32 mmol/L 28 24 26  Calcium 8.9 - 10.3 mg/dL 9.3 9.4 9.2  Total Protein 6.5 - 8.1 g/dL 6.7 7.8 7.0  Total Bilirubin 0.3 - 1.2 mg/dL 0.3 0.9 0.5  Alkaline Phos 38 - 126 U/L 86  90 81  AST 15 - 41 U/L 13(L) 15 15  ALT 0 - 44 U/L 10 14 13       DIAGNOSTIC IMAGING:  I have independently reviewed the scans and discussed with the patient.   I have reviewed Ashley Travis LPN's note and agree with the documentation.  I personally performed a face-to-face visit, made revisions and my assessment and plan is as follows.    ASSESSMENT & PLAN:   Malignant neoplasm of transverse colon (HCC) 1.  Metastatic colon cancer, K-ras mutation positive, MSI stable: -Status post FOLFOX for 12 cycles and adjuvant setting from 03/30/2015 through 09/06/2015. - PET scan on 11/19/2017 showing peritoneal carcinomatosis, right paratracheal lymph node, nonspecific increased uptake in the right hilar region.  CEA was 12.9 on 11/16/2017. -9 cycles of FOLFIRI from 01/09/2018 through 05/08/2018 with addition of bevacizumab during cycle 5. - Maintenance 5-FU/leucovorin and bevacizumab started on 05/22/2018. - He had a fracture of the right wrist with ORIF on 10/18/2018. -We reviewed results of the recent CT scan CAP from 02/28/2019.  Peritoneal/omental disease is stable.  There is a new right medial clavicle fracture. -Upon further questioning he reports that he ran into a door frame on Friday after losing balance.  He did not lose consciousness. -He was told to follow-up with Dr. Kuzma and wear his sling. -We reviewed his labs which are grossly within normal limits.  He will proceed with his maintenance today.  I will reevaluate him in 2 weeks.  2.  Pulmonary embolism: -Incidental PE on CT chest on 05/20/2018.  CT angiogram confirmed bilateral PE. -He is on Xarelto and is tolerating it very well.  3. Peripheral neuropathy: -He has pre-existing neuropathy from prior oxaliplatin. Numbness in the legs has been stable.  4. Diarrhea: -He has diarrhea, 4-5 times per day. He will continue Lomotil as needed.  5. Multiple falls: -He has history of multiple falls since he had a head injury many years  ago.  He does not usually have a any warning prior to fall. -MRI of the brain did not show any major abnormality.     Orders placed this encounter:  Orders Placed This Encounter  Procedures  . CBC with Differential/Platelet  . Comprehensive metabolic panel  . Magnesium  . CEA      Sreedhar Katragadda, MD [facility] [phone]    

## 2019-03-04 NOTE — Progress Notes (Signed)
Erik [name] reviewed with and pt seen by Dr. Delton Coombes and pt approved for Bevacizumab,Leucovorin and 5FU infusions today per MD                                                                                             [name] tolerated chemo tx well without complaints or incident. Pt discharged with 5FU pump infusing without issues. VSS upon discharge. Pt discharged via wheelchair in satisfactory condition

## 2019-03-04 NOTE — Patient Instructions (Signed)
[facility] at [facility] Discharge Instructions  You were seen today by Dr. Katragadda. He went over your recent lab results. He will see you back in 2 weeks for labs, treatment and follow up.   Thank you for choosing [facility] at [facility] to provide your oncology and hematology care.  To afford each patient quality time with our provider, please arrive at least 15 minutes before your scheduled appointment time.   If you have a lab appointment with the Cancer Center please come in thru the  Main Entrance and check in at the main information desk  You need to re-schedule your appointment should you arrive 10 or more minutes late.  We strive to give you quality time with our providers, and arriving late affects you and other patients whose appointments are after yours.  Also, if you no show three or more times for appointments you may be dismissed from the clinic at the providers discretion.     Again, thank you for choosing [facility].  Our hope is that these requests will decrease the amount of time that you wait before being seen by our physicians.       _____________________________________________________________  Should you have questions after your visit to [facility], please contact our office at [phone] between the hours of 8:00 a.m. and 4:30 p.m.  Voicemails left after 4:00 p.m. will not be returned until the following business day.  For prescription refill requests, have your pharmacy contact our office and allow 72 hours.    Cancer Center Support Programs:   > Cancer Support Group  2nd Tuesday of the month 1pm-2pm, Journey Room    

## 2019-03-04 NOTE — Progress Notes (Signed)
Patient has been assessed, vital signs and labs have been reviewed by Dr. Katragadda. ANC, Creatinine, LFTs, and Platelets are within treatment parameters per Dr. Katragadda. The patient is good to proceed with treatment at this time.  

## 2019-03-04 NOTE — Patient Instructions (Signed)
[facility] Discharge Instructions for Patients Receiving Chemotherapy   Beginning January 23rd 2017 lab work for the [facility] will be done in the  Main lab at [facility] on 1st floor. If you have a lab appointment with the [facility] please come in thru the  Main Entrance and check in at the main information desk   Today you received the following chemotherapy agents Bevacizumab,Leucovorin and 5FU. Follow-up as scheduled. Call clinic for any questions or concerns  To help prevent nausea and vomiting after your treatment, we encourage you to take your nausea medication If you develop nausea and vomiting, or diarrhea that is not controlled by your medication, call the clinic.  The clinic phone number is [phone]. Office hours are Monday-Friday 8:30am-5:00pm.  BELOW ARE SYMPTOMS THAT SHOULD BE REPORTED IMMEDIATELY:  *FEVER GREATER THAN 101.0 F  *CHILLS WITH OR WITHOUT FEVER  NAUSEA AND VOMITING THAT IS NOT CONTROLLED WITH YOUR NAUSEA MEDICATION  *UNUSUAL SHORTNESS OF BREATH  *UNUSUAL BRUISING OR BLEEDING  TENDERNESS IN MOUTH AND THROAT WITH OR WITHOUT PRESENCE OF ULCERS  *URINARY PROBLEMS  *BOWEL PROBLEMS  UNUSUAL RASH Items with * indicate a potential emergency and should be followed up as soon as possible. If you have an emergency after office hours please contact your primary care physician or go to the nearest emergency department.  Please call the clinic during office hours if you have any questions or concerns.   You may also contact the Patient Navigator at [phone] should you have any questions or need assistance in obtaining follow up care.      Resources For Cancer Patients and their Caregivers ? American [employer] Society: Can assist with transportation, wigs, general needs, runs Look Good Feel Better.        410-200-7179 ? Cancer Care: Provides financial assistance, online support groups, medication/co-pay assistance.   1-800-813-HOPE 215-144-1900) ? [employer] Assists [place] cancer patients and their families through emotional , educational and financial support.  908-460-8127 ? [place] DSS Where to apply for food stamps, Medicaid and utility assistance. (682)451-1272 ? [employer]: Transportation to medical appointments. 857-820-7049 ? Social Security Administration: May apply for disability if have a Stage IV cancer. 559-122-3850 (845)185-8857 ? [employer], Disability and Transit Services: Assists with nutrition, care and transit needs. 615-525-4590

## 2019-03-06 ENCOUNTER — Inpatient Hospital Stay (HOSPITAL_COMMUNITY): Payer: Medicaid Other

## 2019-03-06 ENCOUNTER — Other Ambulatory Visit: Payer: Self-pay

## 2019-03-06 VITALS — BP 136/69 | HR 91 | Temp 97.1°F | Resp 18

## 2019-03-06 DIAGNOSIS — Z5111 Encounter for antineoplastic chemotherapy: Secondary | ICD-10-CM | POA: Diagnosis not present

## 2019-03-06 DIAGNOSIS — C184 Malignant neoplasm of transverse colon: Secondary | ICD-10-CM

## 2019-03-06 DIAGNOSIS — C188 Malignant neoplasm of overlapping sites of colon: Secondary | ICD-10-CM

## 2019-03-06 MED ORDER — HEPARIN SOD (PORK) LOCK FLUSH 100 UNIT/ML IV SOLN
500.0000 [IU] | Freq: Once | INTRAVENOUS | Status: AC | PRN
  Administered 2019-03-06: 13:00:00 500 [IU]

## 2019-03-06 MED ORDER — SODIUM CHLORIDE 0.9% FLUSH
10.0000 mL | INTRAVENOUS | Status: DC | PRN
  Administered 2019-03-06: 10 mL

## 2019-03-06 NOTE — Progress Notes (Signed)
Erik [name] returns today for port de access and flush after 46 hr continous infusion of 89fu. Tolerated infusion without problems. Portacath located left chest wall was  deaccessed and flushed with 49ml NS and 500U/28ml Heparin and needle removed intact.  Procedure without incident. Patient tolerated procedure well.

## 2019-03-09 NOTE — Assessment & Plan Note (Signed)
1.  Metastatic colon cancer, K-ras mutation positive, MSI stable: -Status post FOLFOX for 12 cycles and adjuvant setting from 03/30/2015 through 09/06/2015. - PET scan on 11/19/2017 showing peritoneal carcinomatosis, right paratracheal lymph node, nonspecific increased uptake in the right hilar region.  CEA was 12.9 on 11/16/2017. -9 cycles of FOLFIRI from 01/09/2018 through 05/08/2018 with addition of bevacizumab during cycle 5. - Maintenance 5-FU/leucovorin and bevacizumab started on 05/22/2018. - He had a fracture of the right wrist with ORIF on 10/18/2018. -We reviewed results of the recent CT scan CAP from 02/28/2019.  Peritoneal/omental disease is stable.  There is a new right medial clavicle fracture. -Upon further questioning he reports that he ran into a door frame on Friday after losing balance.  He did not lose consciousness. -He was told to follow-up with Dr. Fredna Dow and wear his sling. -We reviewed his labs which are grossly within normal limits.  He will proceed with his maintenance today.  I will reevaluate him in 2 weeks.  2.  Pulmonary embolism: -Incidental PE on CT chest on 05/20/2018.  CT angiogram confirmed bilateral PE. -He is on Xarelto and is tolerating it very well.  3. Peripheral neuropathy: -He has pre-existing neuropathy from prior oxaliplatin. Numbness in the legs has been stable.  4. Diarrhea: -He has diarrhea, 4-5 times per day. He will continue Lomotil as needed.  5. Multiple falls: -He has history of multiple falls since he had a head injury many years ago.  He does not usually have a any warning prior to fall. -MRI of the brain did not show any major abnormality.

## 2019-03-18 ENCOUNTER — Encounter (HOSPITAL_COMMUNITY): Payer: Self-pay | Admitting: Hematology

## 2019-03-18 ENCOUNTER — Inpatient Hospital Stay (HOSPITAL_BASED_OUTPATIENT_CLINIC_OR_DEPARTMENT_OTHER): Payer: Medicaid Other | Admitting: Hematology

## 2019-03-18 ENCOUNTER — Inpatient Hospital Stay (HOSPITAL_COMMUNITY): Payer: Medicaid Other

## 2019-03-18 ENCOUNTER — Other Ambulatory Visit: Payer: Self-pay

## 2019-03-18 ENCOUNTER — Inpatient Hospital Stay (HOSPITAL_COMMUNITY): Payer: Medicaid Other | Attending: Hematology

## 2019-03-18 VITALS — BP 147/83 | HR 107 | Temp 98.6°F | Resp 16

## 2019-03-18 DIAGNOSIS — C184 Malignant neoplasm of transverse colon: Secondary | ICD-10-CM

## 2019-03-18 DIAGNOSIS — Z5112 Encounter for antineoplastic immunotherapy: Secondary | ICD-10-CM | POA: Insufficient documentation

## 2019-03-18 DIAGNOSIS — C772 Secondary and unspecified malignant neoplasm of intra-abdominal lymph nodes: Secondary | ICD-10-CM | POA: Insufficient documentation

## 2019-03-18 DIAGNOSIS — Z5111 Encounter for antineoplastic chemotherapy: Secondary | ICD-10-CM | POA: Diagnosis not present

## 2019-03-18 DIAGNOSIS — E538 Deficiency of other specified B group vitamins: Secondary | ICD-10-CM

## 2019-03-18 DIAGNOSIS — G629 Polyneuropathy, unspecified: Secondary | ICD-10-CM | POA: Diagnosis not present

## 2019-03-18 DIAGNOSIS — C188 Malignant neoplasm of overlapping sites of colon: Secondary | ICD-10-CM

## 2019-03-18 LAB — COMPREHENSIVE METABOLIC PANEL
ALT: 12 U/L (ref 0–44)
AST: 16 U/L (ref 15–41)
Albumin: 3.9 g/dL (ref 3.5–5.0)
Alkaline Phosphatase: 109 U/L (ref 38–126)
Anion gap: 10 (ref 5–15)
BUN: 10 mg/dL (ref 8–23)
CO2: 27 mmol/L (ref 22–32)
Calcium: 9.4 mg/dL (ref 8.9–10.3)
Chloride: 100 mmol/L (ref 98–111)
Creatinine, Ser: 0.88 mg/dL (ref 0.61–1.24)
GFR calc Af Amer: >60 mL/min (ref >60)
GFR calc non Af Amer: >60 mL/min (ref >60)
Glucose, Bld: 125 mg/dL — ABNORMAL HIGH (ref 70–99)
Potassium: 4.6 mmol/L (ref 3.5–5.1)
Sodium: 137 mmol/L (ref 135–145)
Total Bilirubin: 0.6 mg/dL (ref 0.3–1.2)
Total Protein: 7.3 g/dL (ref 6.5–8.1)

## 2019-03-18 LAB — CBC WITH DIFFERENTIAL/PLATELET
Abs Immature Granulocytes: 0.03 10*3/uL (ref 0.00–0.07)
Basophils Absolute: 0.1 10*3/uL (ref 0.0–0.1)
Basophils Relative: 1 %
Eosinophils Absolute: 0.2 10*3/uL (ref 0.0–0.5)
Eosinophils Relative: 3 %
HCT: 44.6 % (ref 39.0–52.0)
Hemoglobin: 14.3 g/dL (ref 13.0–17.0)
Immature Granulocytes: 0 %
Lymphocytes Relative: 15 %
Lymphs Abs: 1.1 10*3/uL (ref 0.7–4.0)
MCH: 32.1 pg (ref 26.0–34.0)
MCHC: 32.1 g/dL (ref 30.0–36.0)
MCV: 100 fL (ref 80.0–100.0)
Monocytes Absolute: 0.7 10*3/uL (ref 0.1–1.0)
Monocytes Relative: 9 %
Neutro Abs: 5.1 10*3/uL (ref 1.7–7.7)
Neutrophils Relative %: 72 %
Platelets: 193 10*3/uL (ref 150–400)
RBC: 4.46 MIL/uL (ref 4.22–5.81)
RDW: 16.3 % — ABNORMAL HIGH (ref 11.5–15.5)
WBC: 7.2 10*3/uL (ref 4.0–10.5)
nRBC: 0 % (ref 0.0–0.2)

## 2019-03-18 LAB — URINALYSIS, DIPSTICK ONLY
Bilirubin Urine: NEGATIVE
Glucose, UA: NEGATIVE mg/dL
Hgb urine dipstick: NEGATIVE
Ketones, ur: NEGATIVE mg/dL
Leukocytes,Ua: NEGATIVE
Nitrite: NEGATIVE
Protein, ur: NEGATIVE mg/dL
Specific Gravity, Urine: 1.011 (ref 1.005–1.030)
pH: 5 (ref 5.0–8.0)

## 2019-03-18 LAB — MAGNESIUM: Magnesium: 1.9 mg/dL (ref 1.7–2.4)

## 2019-03-18 MED ORDER — SODIUM CHLORIDE 0.9 % IV SOLN
5.0000 mg/kg | Freq: Once | INTRAVENOUS | Status: AC
  Administered 2019-03-18: 500 mg via INTRAVENOUS
  Filled 2019-03-18: qty 4

## 2019-03-18 MED ORDER — PALONOSETRON HCL INJECTION 0.25 MG/5ML
0.2500 mg | Freq: Once | INTRAVENOUS | Status: AC
  Administered 2019-03-18: 0.25 mg via INTRAVENOUS

## 2019-03-18 MED ORDER — LEUCOVORIN CALCIUM INJECTION 350 MG
900.0000 mg | Freq: Once | INTRAVENOUS | Status: AC
  Administered 2019-03-18: 900 mg via INTRAVENOUS
  Filled 2019-03-18: qty 45

## 2019-03-18 MED ORDER — SODIUM CHLORIDE 0.9 % IV SOLN: Freq: Once | INTRAVENOUS | Status: AC

## 2019-03-18 MED ORDER — PALONOSETRON HCL INJECTION 0.25 MG/5ML
INTRAVENOUS | Status: AC
  Filled 2019-03-18: qty 5

## 2019-03-18 MED ORDER — FLUOROURACIL CHEMO INJECTION 2.5 GM/50ML
400.0000 mg/m2 | Freq: Once | INTRAVENOUS | Status: AC
  Administered 2019-03-18: 950 mg via INTRAVENOUS
  Filled 2019-03-18: qty 19

## 2019-03-18 MED ORDER — SODIUM CHLORIDE 0.9 % IV SOLN
2400.0000 mg/m2 | INTRAVENOUS | Status: DC
  Administered 2019-03-18: 5650 mg via INTRAVENOUS
  Filled 2019-03-18: qty 113

## 2019-03-18 MED ORDER — SODIUM CHLORIDE 0.9% FLUSH
10.0000 mL | INTRAVENOUS | Status: DC | PRN
  Administered 2019-03-18: 10 mL

## 2019-03-18 MED ORDER — SODIUM CHLORIDE 0.9 % IV SOLN
10.0000 mg | Freq: Once | INTRAVENOUS | Status: AC
  Administered 2019-03-18: 10 mg via INTRAVENOUS
  Filled 2019-03-18: qty 10

## 2019-03-18 MED ORDER — ATROPINE SULFATE 1 MG/ML IJ SOLN
0.5000 mg | Freq: Once | INTRAMUSCULAR | Status: AC
  Administered 2019-03-18: 0.5 mg via INTRAVENOUS
  Filled 2019-03-18: qty 1

## 2019-03-18 MED ORDER — CYANOCOBALAMIN 1000 MCG/ML IJ SOLN
1000.0000 ug | Freq: Once | INTRAMUSCULAR | Status: AC
  Administered 2019-03-18: 1000 ug via INTRAMUSCULAR
  Filled 2019-03-18: qty 1

## 2019-03-18 NOTE — Assessment & Plan Note (Signed)
1.  Metastatic colon cancer, K-ras mutation positive, MSI stable: -Maintenance 5-FU/leucovorin and bevacizumab started on 05/22/2018. -CT CAP reviewed by me on 03/01/2019 showed peritoneal/omental disease is stable. -He is following up with Dr. Fredna Dow for his right wrist fracture and medial clavicle fracture. -I have reviewed his CBC and LFTs today.  They are adequate to proceed with his maintenance. -We will reevaluate him in 2 weeks.  2.  Pulmonary embolism: -Incidental PE on chest CT on 05/20/2018. -He is tolerating Xarelto without any bleeding issues.  3.  Peripheral neuropathy: -Numbness in the legs from prior oxaliplatin neuropathy is stable.  4.  Diarrhea: -He will continue Lomotil for his baseline diarrhea of 4-5 times per day.  5.  Multiple falls: -He has history of multiple falls since he had head injury many years ago.  He does not usually get any warning signs. -MRI of the brain recently did not show any abnormality.

## 2019-03-18 NOTE — Progress Notes (Signed)
Erik [name], [place] [address]   CLINIC:  Medical Oncology/Hematology  PCP:  Erik [name], [facility] [place] [address] [phone]   REASON FOR VISIT:  Follow-up for metastatic colon cancer   BRIEF ONCOLOGIC HISTORY:  Oncology History Overview Note  Stage IIIC Erik [name]) adenocarcinoma of transverse colon, diagnosed on colonoscopy by Erik [name] on 01/12/2015 followed by definitive surgery by Dr. Clayburn [name] with right [name] on 02/12/2015.  He then underwent FOLFOX x 12 cycles in the adjuvant setting (03/30/2015- 09/06/2015).   Malignant neoplasm of transverse colon ([place])  01/12/2015 Pathologic Stage   Colon, biopsy, distal transverse - TUBULOVILLOUS ADENOMA WITH HIGH GRADE DYSPLASIA.   01/12/2015 Procedure   Colonoscopy by Erik [name].   01/12/2015 Tumor Marker   CEA: 6.6 (H)    01/18/2015 Imaging   CT abd/pelvis- Apple-core lesion identified in the mid transverse colon without obstruction. No evidence for lymphadenopathy in the gastrohepatic ligament or omentum.  Stable 8 mm hypo attenuating lesion in the left liver, likely a cyst.   02/10/2015 Initial Diagnosis   Adenocarcinoma of transverse colon ([place])   02/12/2015 Definitive Surgery   Erik [name]    02/12/2015 Pathology Results   Mucinous adenocarcinoma with penetration of visceral peritoneum, 4/19 lymph nodes for metastatic disease, negative resection margins, with LVI and perineural invasion   03/30/2015 - 09/06/2015 Chemotherapy   FOLFOX x 12 cycles   05/25/2015 Treatment Plan Change   5 FU bolus discontinued for cycle #5   06/08/2015 Treatment Plan Change   Treatment deferred x 1 week   06/15/2015 Treatment Plan Change   5FU CI decreased by 10% and Oxaliplatin reduced by 15% for cycles #6-#11; Oxaliplatin dropped for cycle #12 d/t neuropathy.    10/20/2015 Imaging   CT CAP- Right [name] without evidence of  metastatic disease. 2. Previously measured ground-glass lesion in the left upper lobe has resolved. 3. Probable food debris in the stomach, simulating gastric wall thickening. Please correlate clinically. 4. 6 mm irregular nodular density in the left upper lobe, stable. Continued attention on followup exams is warranted.   01/25/2016 Procedure   Colonoscopy by Erik [name]- Non-thrombosed external hemorrhoids found on digital rectal [name]. - One 4 mm polyp in the rectum, removed with a cold biopsy forceps. Resected and retrieved. - Congested mucosa in the neo-terminal ileum. Biopsied. - Redundant colon. - Internal hemorrhoids.   01/26/2016 Pathology Results   1. Terminal ileum, biopsy - MILD ACUTE (ACTIVE) ILEITIS. - NO DYSPLASIA OR MALIGNANCY IDENTIFIED - SEE COMMENT. 2. Rectum, polyp(s) - HYPERPLASTIC POLYP (X 1). - NO DYSPLASIA OR MALIGNANCY IDENTIFIED.   04/13/2016 Imaging   CT chest- Stable CT chest. 6 mm irregular nodule anterior left upper lobe is stable. The scattered areas of peribronchovascular micro nodularity in the lungs bilaterally are unchanged.   10/19/2016 Imaging   CT CAP: 1. Status post right [name]. No findings to suggest metastatic disease in the abdomen or pelvis. 2. Aortic atherosclerosis. 3. Additional incidental findings, as above. Aortic Atherosclerosis (ICD10-I70.0).   01/09/2018 -  Chemotherapy   The patient had palonosetron (ALOXI) injection 0.25 mg, 0.25 mg, Intravenous,  Once, 29 of 31 cycles Administration: 0.25 mg (01/09/2018), 0.25 mg (01/23/2018), 0.25 mg (02/11/2018), 0.25 mg (02/25/2018), 0.25 mg (03/12/2018), 0.25 mg (03/27/2018), 0.25 mg (04/10/2018), 0.25 mg (04/24/2018), 0.25 mg (05/08/2018), 0.25 mg (05/22/2018), 0.25 mg (06/10/2018), 0.25 mg (06/26/2018), 0.25 mg (07/10/2018), 0.25 mg (07/24/2018), 0.25 mg (08/13/2018), 0.25 mg (08/27/2018),  0.25 mg (09/10/2018), 0.25 mg (09/24/2018), 0.25 mg (10/08/2018), 0.25 mg (11/05/2018), 0.25 mg (11/19/2018), 0.25 mg  (12/04/2018), 0.25 mg (12/18/2018), 0.25 mg (01/08/2019), 0.25 mg (01/22/2019), 0.25 mg (02/04/2019), 0.25 mg (02/18/2019), 0.25 mg (03/04/2019), 0.25 mg (03/18/2019) irinotecan (CAMPTOSAR) 420 mg in dextrose 5 % 500 mL chemo infusion, 180 mg/m2 = 420 mg, Intravenous,  Once, 9 of 9 cycles Administration: 420 mg (01/09/2018), 420 mg (01/23/2018), 420 mg (02/11/2018), 420 mg (02/25/2018), 420 mg (03/12/2018), 420 mg (03/27/2018), 420 mg (04/10/2018), 420 mg (04/24/2018), 420 mg (05/08/2018) leucovorin 900 mg in dextrose 5 % 250 mL infusion, 944 mg, Intravenous,  Once, 29 of 31 cycles Administration: 900 mg (01/09/2018), 900 mg (01/23/2018), 900 mg (02/11/2018), 900 mg (02/25/2018), 900 mg (03/12/2018), 900 mg (03/27/2018), 900 mg (04/10/2018), 900 mg (04/24/2018), 900 mg (05/08/2018), 900 mg (06/10/2018), 900 mg (06/26/2018), 900 mg (07/10/2018), 900 mg (07/24/2018), 900 mg (08/13/2018), 900 mg (08/27/2018), 900 mg (09/10/2018), 900 mg (09/24/2018), 900 mg (10/08/2018), 900 mg (11/05/2018), 900 mg (11/19/2018), 900 mg (12/04/2018), 900 mg (12/18/2018), 900 mg (01/08/2019), 900 mg (01/22/2019), 900 mg (02/04/2019), 900 mg (02/18/2019), 900 mg (03/04/2019), 900 mg (03/18/2019) fluorouracil (ADRUCIL) chemo injection 950 mg, 400 mg/m2 = 950 mg, Intravenous,  Once, 29 of 31 cycles Administration: 950 mg (01/09/2018), 950 mg (01/23/2018), 950 mg (02/11/2018), 950 mg (02/25/2018), 950 mg (03/12/2018), 950 mg (03/27/2018), 950 mg (04/10/2018), 950 mg (04/24/2018), 950 mg (05/08/2018), 950 mg (05/22/2018), 950 mg (06/10/2018), 950 mg (06/26/2018), 950 mg (07/10/2018), 950 mg (07/24/2018), 950 mg (08/13/2018), 950 mg (08/27/2018), 950 mg (09/10/2018), 950 mg (09/24/2018), 950 mg (10/08/2018), 950 mg (11/05/2018), 950 mg (11/19/2018), 950 mg (12/04/2018), 950 mg (12/18/2018), 950 mg (01/08/2019), 950 mg (01/22/2019), 950 mg (02/04/2019), 950 mg (02/18/2019), 950 mg (03/04/2019), 950 mg (03/18/2019) fluorouracil (ADRUCIL) 5,650 mg in sodium chloride 0.9 % 137 mL chemo infusion, 2,400 mg/m2 = 5,650 mg,  Intravenous, 1 Day/Dose, 29 of 31 cycles Administration: 5,650 mg (01/09/2018), 5,650 mg (01/23/2018), 5,650 mg (02/11/2018), 5,650 mg (02/25/2018), 5,650 mg (03/12/2018), 5,650 mg (03/27/2018), 5,650 mg (04/10/2018), 5,650 mg (04/24/2018), 5,650 mg (05/08/2018), 5,650 mg (05/22/2018), 5,650 mg (06/10/2018), 5,650 mg (06/26/2018), 5,650 mg (07/10/2018), 5,650 mg (07/24/2018), 5,650 mg (08/13/2018), 5,650 mg (08/27/2018), 5,650 mg (09/10/2018), 5,650 mg (09/24/2018), 5,650 mg (10/08/2018), 5,650 mg (11/05/2018), 5,650 mg (11/19/2018), 5,650 mg (12/04/2018), 5,650 mg (12/18/2018), 5,650 mg (01/08/2019), 5,650 mg (01/22/2019), 5,650 mg (02/04/2019), 5,650 mg (02/18/2019), 5,650 mg (03/04/2019), 5,650 mg (03/18/2019) bevacizumab-awwb (MVASI) 500 mg in sodium chloride 0.9 % 100 mL chemo infusion, 4.7 mg/kg = 525 mg (100 % of original dose 5 mg/kg), Intravenous,  Once, 1 of 1 cycle Dose modification: 5 mg/kg (original dose 5 mg/kg, Cycle 24) Administration: 500 mg (01/08/2019) bevacizumab-bvzr (ZIRABEV) 500 mg in sodium chloride 0.9 % 100 mL chemo infusion, 5 mg/kg = 500 mg (100 % of original dose 5 mg/kg), Intravenous,  Once, 8 of 10 cycles Dose modification: 5 mg/kg (original dose 5 mg/kg, Cycle 21) Administration: 500 mg (11/19/2018), 500 mg (12/04/2018), 500 mg (12/18/2018), 500 mg (01/22/2019), 500 mg (02/04/2019), 500 mg (02/18/2019), 500 mg (03/04/2019), 500 mg (03/18/2019)  for chemotherapy treatment.    01/23/2018 Cancer Staging   Staging form: Colon and Rectum, AJCC 7th Edition - Pathologic: M1 - Signed by Zoila Shutter, MD on 01/23/2018   Malignant neoplasm of overlapping sites of colon ([place])  12/26/2017 Initial Diagnosis   Cancer of overlapping sites of colon metastatic to intra-abdominal lymph node ([place])   01/09/2018 -  Chemotherapy   The patient had palonosetron (  ALOXI) injection 0.25 mg, 0.25 mg, Intravenous,  Once, 13 of 14 cycles Administration: 0.25 mg (01/09/2018), 0.25 mg (01/23/2018), 0.25 mg (02/11/2018), 0.25 mg  (02/25/2018), 0.25 mg (03/12/2018), 0.25 mg (03/27/2018), 0.25 mg (04/10/2018), 0.25 mg (04/24/2018), 0.25 mg (05/08/2018), 0.25 mg (05/22/2018), 0.25 mg (06/10/2018), 0.25 mg (06/26/2018), 0.25 mg (07/10/2018) irinotecan (CAMPTOSAR) 420 mg in dextrose 5 % 500 mL chemo infusion, 180 mg/m2 = 420 mg, Intravenous,  Once, 9 of 9 cycles Administration: 420 mg (01/09/2018), 420 mg (01/23/2018), 420 mg (02/11/2018), 420 mg (02/25/2018), 420 mg (03/12/2018), 420 mg (03/27/2018), 420 mg (04/10/2018), 420 mg (04/24/2018), 420 mg (05/08/2018) leucovorin 900 mg in dextrose 5 % 250 mL infusion, 944 mg, Intravenous,  Once, 13 of 14 cycles Administration: 900 mg (01/09/2018), 900 mg (01/23/2018), 900 mg (02/11/2018), 900 mg (02/25/2018), 900 mg (03/12/2018), 900 mg (03/27/2018), 900 mg (04/10/2018), 900 mg (04/24/2018), 900 mg (05/08/2018), 900 mg (06/10/2018), 900 mg (06/26/2018), 900 mg (07/10/2018) fluorouracil (ADRUCIL) chemo injection 950 mg, 400 mg/m2 = 950 mg, Intravenous,  Once, 13 of 14 cycles Administration: 950 mg (01/09/2018), 950 mg (01/23/2018), 950 mg (02/11/2018), 950 mg (02/25/2018), 950 mg (03/12/2018), 950 mg (03/27/2018), 950 mg (04/10/2018), 950 mg (04/24/2018), 950 mg (05/08/2018), 950 mg (05/22/2018), 950 mg (06/10/2018), 950 mg (06/26/2018), 950 mg (07/10/2018) fluorouracil (ADRUCIL) 5,650 mg in sodium chloride 0.9 % 137 mL chemo infusion, 2,400 mg/m2 = 5,650 mg, Intravenous, 1 Day/Dose, 13 of 14 cycles Administration: 5,650 mg (01/09/2018), 5,650 mg (01/23/2018), 5,650 mg (02/11/2018), 5,650 mg (02/25/2018), 5,650 mg (03/12/2018), 5,650 mg (03/27/2018), 5,650 mg (04/10/2018), 5,650 mg (04/24/2018), 5,650 mg (05/08/2018), 5,650 mg (05/22/2018), 5,650 mg (06/10/2018), 5,650 mg (06/26/2018), 5,650 mg (07/10/2018)  for chemotherapy treatment.    Colon cancer metastasized to multiple sites [facility])  01/16/2018 Initial Diagnosis   Colon cancer metastasized to multiple sites [facility])   01/23/2018 - 02/10/2018 Chemotherapy   The patient had panitumumab (VECTIBIX) 600 mg in sodium  chloride 0.9 % 100 mL chemo infusion, 640 mg, Intravenous,  Once, 1 of 3 cycles Administration: 600 mg (01/23/2018)  for chemotherapy treatment.    03/12/2018 - 10/21/2018 Chemotherapy   The patient had bevacizumab (AVASTIN) 500 mg in sodium chloride 0.9 % 100 mL chemo infusion, 525 mg, Intravenous,  Once, 15 of 22 cycles Administration: 500 mg (03/12/2018), 500 mg (03/27/2018), 500 mg (04/10/2018), 500 mg (04/24/2018), 500 mg (06/10/2018), 500 mg (05/08/2018), 500 mg (05/22/2018), 500 mg (06/26/2018), 500 mg (07/10/2018), 500 mg (07/24/2018), 500 mg (08/13/2018), 500 mg (08/27/2018), 500 mg (09/10/2018), 500 mg (09/24/2018), 500 mg (10/08/2018)  for chemotherapy treatment.       CANCER STAGING: Cancer Staging Malignant neoplasm of transverse colon [facility]) Staging form: Colon and Rectum, AJCC 7th Edition - Pathologic stage from 03/04/2015: Stage IIIC (T4a, N2a, cM0) - Signed by Baird Cancer, PA-C on 03/04/2015 - Pathologic: M1 - Signed by Zoila Shutter, MD on 01/23/2018    INTERVAL HISTORY:  Erik [name] 64 y.o. male seen for follow-up of metastatic colon cancer and toxicity assessment prior to next cycle of chemotherapy.  Appetite is 25%.  Energy levels are 50%.  Occasional dizziness is stable.  Diarrhea is well controlled with Lomotil.  Numbness in the feet is also stable.  Minor nosebleeds were reported.  REVIEW OF SYSTEMS:  Review of Systems  Gastrointestinal: Positive for diarrhea.  Neurological: Positive for numbness.  All other systems reviewed and are negative.    PAST MEDICAL/SURGICAL HISTORY:  Past Medical History:  Diagnosis Date  . Abnormal stress echocardiogram   .  Adenocarcinoma of transverse colon ([place]) 02/10/2015  . Alcohol abuse    Heavy Use up until 2010  . Anxiety   . Blood transfusion without reported diagnosis   . Cancer of overlapping sites of colon metastatic to intra-abdominal lymph node ([place]) 12/26/2017  . COPD (chronic obstructive pulmonary disease) ([place])   . Depression   .  Emphysema of lung ([place])   . GERD (gastroesophageal reflux disease)   . Head trauma 2001   closed head injury; coma for 4 weeks  . Hypercholesterolemia   . Hypertension   . ING HERN W/GANGREN RECUR UNILAT/UNSPEC ING HERN 04/21/2009   Qualifier: Diagnosis of  By: Verl Blalock, MD, Delanna Ahmadi PTSD (post-traumatic stress disorder)   . Pulmonary nodule 06/14/2016  . SAH (subarachnoid hemorrhage) ([name]) 08/31/2012  . SDH (subdural hematoma) ([place]) 08/31/2012   Past Surgical History:  Procedure Laterality Date  . BIOPSY  01/25/2016   Procedure: BIOPSY;  Surgeon: Danie Binder, MD;  [facility];  Service: Endoscopy;;  ileum;   . cardiac cath    . COLONOSCOPY  2011   Erik [name]: multiple adenomas and hyperplastic polyps  . COLONOSCOPY WITH PROPOFOL N/A 01/12/2015   Procedure: COLONOSCOPY WITH PROPOFOL;  Surgeon: Danie Binder, MD;  [facility];  Service: Endoscopy;  Laterality: N/A;  1030  . COLONOSCOPY WITH PROPOFOL N/A 01/25/2016   Procedure: COLONOSCOPY WITH PROPOFOL;  Surgeon: Danie Binder, MD;  [facility];  Service: Endoscopy;  Laterality: N/A;  1230  . CRANIOTOMY  2001  . ESOPHAGOGASTRODUODENOSCOPY (EGD) WITH PROPOFOL N/A 01/12/2015   Procedure: ESOPHAGOGASTRODUODENOSCOPY (EGD) WITH PROPOFOL;  Surgeon: Danie Binder, MD;  [facility];  Service: Endoscopy;  Laterality: N/A;  . head injury surgery    . HERNIA REPAIR Right 2012   Inguinal- Forestine Na  . KIDNEY SURGERY     >30 years ago  . LAPAROSCOPIC RIGHT HEMI COLECTOMY Left 02/10/2015   Procedure: LAPAROSCOPIC THEN OPEN RIGHT HEMI COLECTOMY;  Surgeon: Erik Pert, MD;  Location: [facility];  Service: General;  Laterality: Left;  . LAPAROTOMY N/A 12/19/2017   Procedure: EXPLORATORY LAPAROTOMY;  Surgeon: Aviva Signs, MD;  Location: [facility];  Service: General;  Laterality: N/A;  . OPEN REDUCTION INTERNAL FIXATION (ORIF) DISTAL RADIAL FRACTURE Right 10/18/2018   Procedure: OPEN REDUCTION  INTERNAL FIXATION (ORIF) RIGHT DISTAL RADIAL FRACTURE;  Surgeon: [name], MD;  Location: [place];  Service: Orthopedics;  Laterality: Right;  block in preop  . OPEN REDUCTION INTERNAL FIXATION (ORIF) DISTAL RADIAL FRACTURE Right 12/20/2018   Procedure: OPEN REDUCTION INTERNAL FIXATION (ORIF) DISTAL RADIUS AND ULNA FRACTURE;  Surgeon: [name], MD;  Location: [place];  Service: Orthopedics;  Laterality: Right;  block  . POLYPECTOMY  01/25/2016   Procedure: POLYPECTOMY;  Surgeon: Danie Binder, MD;  [facility];  Service: Endoscopy;;  colon  . PORT-A-CATH REMOVAL N/A 02/26/2017   Procedure: MINOR REMOVAL PORT-A-CATH;  Surgeon: Aviva Signs, MD;  Location: [facility];  Service: General;  Laterality: N/A;  Pt to arrive at [place] N/A 03/24/2015   Procedure: INSERTION PORT-A-CATH;  Surgeon: Jules Husbands, MD;  Location: [facility];  Service: General;  Laterality: N/A;  . PORTACATH PLACEMENT Left 01/07/2018   Procedure: INSERTION PORT A CATH (ATTACHED CATHETER IN LEFT SUBCLAVIAN);  Surgeon: Aviva Signs, MD;  Location[facility];  Service: General;  Laterality: Left;     SOCIAL HISTORY:  Social History   Socioeconomic History  . Marital status: Divorced    Spouse name: Not on file  . Number of children: 2  . Years of education: 65  . Highest education level: Not on file  Occupational History  . Occupation: disabled/unemployed  . Occupation: unemployed/Mediicaid only    Comment: NO INCOME  Tobacco Use  . Smoking status: Former Smoker    Packs/day: 0.10    Years: 30.00    Pack years: 3.00    Types: Cigarettes    Start date: 02/06/1973    Quit date: 12/09/2017    Years since quitting: 1.2  . Smokeless tobacco: Never Used  . Tobacco comment: Quit on 12/09/2017  Substance and Sexual Activity  . Alcohol use: Yes    Alcohol/week: 0.0 standard drinks    Comment: beer occ, history of ETOH abuse in remote past.   . Drug use:  No  . Sexual activity: Never    Partners: Female    Birth control/protection: None  Other Topics Concern  . Not on file  Social History Narrative   Single   Lives alone   Turned down for disability   Social Determinants of Health   Financial Resource Strain:   . Difficulty of Paying Living Expenses: Not on file  Food Insecurity:   . Worried About Charity fundraiser in the Last Year: Not on file  . Ran Out of Food in the Last Year: Not on file  Transportation Needs:   . Lack of Transportation (Medical): Not on file  . Lack of Transportation (Non-Medical): Not on file  Physical Activity:   . Days of Exercise per Week: Not on file  . Minutes of Exercise per Session: Not on file  Stress:   . Feeling of Stress : Not on file  Social Connections:   . Frequency of Communication with Friends and Family: Not on file  . Frequency of Social Gatherings with Friends and Family: Not on file  . Attends Religious Services: Not on file  . Active Member of Clubs or Organizations: Not on file  . Attends Archivist Meetings: Not on file  . Marital Status: Not on file  Intimate Partner Violence:   . Fear of Current or Ex-Partner: Not on file  . Emotionally Abused: Not on file  . Physically Abused: Not on file  . Sexually Abused: Not on file    FAMILY HISTORY:  Family History  Problem Relation Age of Onset  . Pulmonary embolism Mother   . Breast cancer Mother   . Cancer Mother        breast cancer  . Arthritis Mother   . Heart disease Father   . Cancer Father 64       Leukemia  . Cancer Paternal Grandfather        Lung  . Ataxia Neg Hx   . Chorea Neg Hx   . Dementia Neg Hx   . Mental retardation Neg Hx   . Migraines Neg Hx   . Multiple sclerosis Neg Hx   . Neurofibromatosis Neg Hx   . Neuropathy Neg Hx   . Parkinsonism Neg Hx   . Seizures Neg Hx   . Stroke Neg Hx   . Colon cancer Neg Hx     CURRENT MEDICATIONS:  Outpatient Encounter Medications as of 03/18/2019    Medication Sig Note  . amitriptyline (ELAVIL) 25 MG tablet Take 1 tablet (25 mg total) by mouth at bedtime.   Marland Kitchen atorvastatin (  LIPITOR) 20 MG tablet TAKE 1 TABLET BY MOUTH AT BEDTIME (Patient taking differently: Take 20 mg by mouth daily. )   . buPROPion (WELLBUTRIN SR) 150 MG 12 hr tablet Take 150 mg by mouth every morning.    . busPIRone (BUSPAR) 10 MG tablet Take 10 mg by mouth 2 (two) times daily.   . fluorouracil CALGB 26333 in sodium chloride 0.9 % 150 mL Inject 5,650 mg into the vein. Over 46 hours   . FLUoxetine (PROZAC) 20 MG capsule TAKE 1 CAPSULE BY MOUTH 2 TIMES A DAY (Patient taking differently: Take 20 mg by mouth 2 (two) times daily. ) 06/10/2018: 1 tab TID  . gabapentin (NEURONTIN) 300 MG capsule TAKE 1 CAPSULE BY MOUTH THREE TIMES A DAY   . IRINOTECAN HCL IV Inject 420 mg into the vein every 14 (fourteen) days.   Marland Kitchen LEUCOVORIN CALCIUM IV Inject 944 mg into the vein every 14 (fourteen) days.   Marland Kitchen omeprazole (PRILOSEC) 20 MG capsule Take 20 mg by mouth every morning.    . rivaroxaban (XARELTO) 20 MG TABS tablet Take 1 tablet (20 mg total) by mouth daily with supper.   . Vitamin D, Ergocalciferol, (DRISDOL) 1.25 MG (50000 UT) CAPS capsule Take 50,000 Units by mouth once a week.   . [DISCONTINUED] WELLBUTRIN XL 150 MG 24 hr tablet Take 150 mg by mouth every morning.   . lidocaine-prilocaine (EMLA) cream APPLY A SMALL AMOUNT OVER PORT SITE AND COVER WITH PLASTIC WRAP ONE HOUR PRIOR TO APPOINTMENT (Patient not taking: Reported on 03/18/2019)   . loperamide (IMODIUM) 2 MG capsule TAKE 2 CAPSULES BY MOUTH TWICE DAILY (Patient not taking: Reported on 03/18/2019)   . ondansetron (ZOFRAN) 8 MG tablet Take 1 tablet (8 mg total) by mouth 2 (two) times daily as needed for refractory nausea / vomiting. Start on day 3 after chemotherapy. (Patient not taking: Reported on 03/18/2019)   . PROAIR HFA 108 (90 Base) MCG/ACT inhaler INHALE 2 PUFFS BY MOUTH EVERY 6 HOURS AS NEEDED FOR SHORTNESS OF BREATH/WHEEZING.  (Patient not taking: Reported on 03/18/2019)   . prochlorperazine (COMPAZINE) 10 MG tablet Take 1 tablet (10 mg total) by mouth every 6 (six) hours as needed (NAUSEA). (Patient not taking: Reported on 03/18/2019)    Facility-Administered Encounter Medications as of 03/18/2019  Medication  . sodium chloride flush (NS) 0.9 % injection 10 mL    ALLERGIES:  No Known Allergies   PHYSICAL [name]:  ECOG Performance status: 1  Vitals:   03/18/19 0819  BP: 117/81  Pulse: (!) 115  Resp: 18  Temp: (!) 97.3 F (36.3 C)  SpO2: 98%   Filed Weights   03/18/19 0819  Weight: 216 lb 6.4 oz (98.2 kg)    Physical [name] Vitals reviewed.  Constitutional:      Appearance: Normal appearance.  Cardiovascular:     Rate and Rhythm: Normal rate and regular rhythm.     Heart sounds: Normal heart sounds.  Pulmonary:     Effort: Pulmonary effort is normal.     Breath sounds: Normal breath sounds.  Abdominal:     General: There is no distension.     Palpations: Abdomen is soft. There is no mass.  Musculoskeletal:        General: No swelling.  Skin:    General: Skin is warm.  Neurological:     General: No focal deficit present.     Mental Status: He is alert and oriented to person, place, and time.  Psychiatric:  Mood and Affect: Mood normal.        Behavior: Behavior normal.      LABORATORY DATA:  I have reviewed the labs as listed.  CBC    Component Value Date/Time   WBC 7.2 03/18/2019 0809   RBC 4.46 03/18/2019 0809   HGB 14.3 03/18/2019 0809   HCT 44.6 03/18/2019 0809   PLT 193 03/18/2019 0809   MCV 100.0 03/18/2019 0809   MCH 32.1 03/18/2019 0809   MCHC 32.1 03/18/2019 0809   RDW 16.3 (H) 03/18/2019 0809   LYMPHSABS 1.1 03/18/2019 0809   MONOABS 0.7 03/18/2019 0809   EOSABS 0.2 03/18/2019 0809   BASOSABS 0.1 03/18/2019 0809   CMP Latest Ref Rng & Units 03/18/2019 03/04/2019 02/18/2019  Glucose 70 - 99 mg/dL 125(H) 100(H) 122(H)  BUN 8 - 23 mg/dL '10 8 9  ' Creatinine 0.61 -  1.24 mg/dL 0.88 0.82 0.91  Sodium 135 - 145 mmol/L 137 136 134(L)  Potassium 3.5 - 5.1 mmol/L 4.6 4.1 3.8  Chloride 98 - 111 mmol/L 100 100 98  CO2 22 - 32 mmol/L '27 28 24  ' Calcium 8.9 - 10.3 mg/dL 9.4 9.3 9.4  Total Protein 6.5 - 8.1 g/dL 7.3 6.7 7.8  Total Bilirubin 0.3 - 1.2 mg/dL 0.6 0.3 0.9  Alkaline Phos 38 - 126 U/L 109 86 90  AST 15 - 41 U/L 16 13(L) 15  ALT 0 - 44 U/L '12 10 14       ' DIAGNOSTIC IMAGING:  I have independently reviewed the scans and discussed with the patient.   I have reviewed Venita Lick LPN's note and agree with the documentation.  I personally performed a face-to-face visit, made revisions and my assessment and plan is as follows.    ASSESSMENT & PLAN:   Malignant neoplasm of transverse colon ([place]) 1.  Metastatic colon cancer, K-ras mutation positive, MSI stable: -Maintenance 5-FU/leucovorin and bevacizumab started on 05/22/2018. -CT CAP reviewed by me on 03/01/2019 showed peritoneal/omental disease is stable. -He is following up with Dr. Fredna Dow for his right wrist fracture and medial clavicle fracture. -I have reviewed his CBC and LFTs today.  They are adequate to proceed with his maintenance. -We will reevaluate him in 2 weeks.  2.  Pulmonary embolism: -Incidental PE on chest CT on 05/20/2018. -He is tolerating Xarelto without any bleeding issues.  3.  Peripheral neuropathy: -Numbness in the legs from prior oxaliplatin neuropathy is stable.  4.  Diarrhea: -He will continue Lomotil for his baseline diarrhea of 4-5 times per day.  5.  Multiple falls: -He has history of multiple falls since he had head injury many years ago.  He does not usually get any warning signs. -MRI of the brain recently did not show any abnormality.     Orders placed this encounter:  No orders of the defined types were placed in this encounter.     Derek Jack, MD [place] [phone]

## 2019-03-18 NOTE — Patient Instructions (Signed)
[facility] Discharge Instructions for Patients Receiving Chemotherapy  Today you received the following chemotherapy agents   To help prevent nausea and vomiting after your treatment, we encourage you to take your nausea medication   If you develop nausea and vomiting that is not controlled by your nausea medication, call the clinic.   BELOW ARE SYMPTOMS THAT SHOULD BE REPORTED IMMEDIATELY:  *FEVER GREATER THAN 100.5 F  *CHILLS WITH OR WITHOUT FEVER  NAUSEA AND VOMITING THAT IS NOT CONTROLLED WITH YOUR NAUSEA MEDICATION  *UNUSUAL SHORTNESS OF BREATH  *UNUSUAL BRUISING OR BLEEDING  TENDERNESS IN MOUTH AND THROAT WITH OR WITHOUT PRESENCE OF ULCERS  *URINARY PROBLEMS  *BOWEL PROBLEMS  UNUSUAL RASH Items with * indicate a potential emergency and should be followed up as soon as possible.  Feel free to call the clinic should you have any questions or concerns. The clinic phone number is [phone].  Please show the CHEMO ALERT CARD at check-in to the Emergency Department and triage nurse.   

## 2019-03-18 NOTE — Progress Notes (Signed)
Labs reviewed today with MD at office visit. Will proceed as planned.   HR [id number] to proceed per MD  Continuous 5FU pump administered per protocol.  Treatment given per orders. Patient tolerated it well without problems. Vitals stable and discharged home from clinic ambulatory. Follow up as scheduled.

## 2019-03-18 NOTE — Patient Instructions (Signed)
[facility] at [facility] Discharge Instructions  Labs drawn peripherally today   Thank you for choosing [facility] at [facility] to provide your oncology and hematology care.  To afford each patient quality time with our provider, please arrive at least 15 minutes before your scheduled appointment time.   If you have a lab appointment with the Cancer Center please come in thru the Main Entrance and check in at the main information desk.  You need to re-schedule your appointment should you arrive 10 or more minutes late.  We strive to give you quality time with our providers, and arriving late affects you and other patients whose appointments are after yours.  Also, if you no show three or more times for appointments you may be dismissed from the clinic at the providers discretion.     Again, thank you for choosing [facility].  Our hope is that these requests will decrease the amount of time that you wait before being seen by our physicians.       _____________________________________________________________  Should you have questions after your visit to  Cancer Center, please contact our office at [phone] between the hours of 8:00 a.m. and 4:30 p.m.  Voicemails left after 4:00 p.m. will not be returned until the following business day.  For prescription refill requests, have your pharmacy contact our office and allow 72 hours.    Due to Covid, you will need to wear a mask upon entering the hospital. If you do not have a mask, a mask will be given to you at the Main Entrance upon arrival. For doctor visits, patients may have 1 support person with them. For treatment visits, patients can not have anyone with them due to social distancing guidelines and our immunocompromised population.     

## 2019-03-18 NOTE — Patient Instructions (Addendum)
[facility] at [facility] Discharge Instructions  You were seen today by Dr. Katragadda. He went over your recent lab results. He will see you back in 2 weeks for labs, treatment and follow up.   Thank you for choosing [facility] at [facility] to provide your oncology and hematology care.  To afford each patient quality time with our provider, please arrive at least 15 minutes before your scheduled appointment time.   If you have a lab appointment with the Cancer Center please come in thru the  Main Entrance and check in at the main information desk  You need to re-schedule your appointment should you arrive 10 or more minutes late.  We strive to give you quality time with our providers, and arriving late affects you and other patients whose appointments are after yours.  Also, if you no show three or more times for appointments you may be dismissed from the clinic at the providers discretion.     Again, thank you for choosing [facility].  Our hope is that these requests will decrease the amount of time that you wait before being seen by our physicians.       _____________________________________________________________  Should you have questions after your visit to [facility], please contact our office at [phone] between the hours of 8:00 a.m. and 4:30 p.m.  Voicemails left after 4:00 p.m. will not be returned until the following business day.  For prescription refill requests, have your pharmacy contact our office and allow 72 hours.    Cancer Center Support Programs:   > Cancer Support Group  2nd Tuesday of the month 1pm-2pm, Journey Room    

## 2019-03-18 NOTE — Progress Notes (Signed)
Patient has been assessed, vital signs and labs have been reviewed by Dr. Katragadda. ANC, Creatinine, LFTs, and Platelets are within treatment parameters per Dr. Katragadda. The patient is good to proceed with treatment at this time.  

## 2019-03-19 LAB — CEA: CEA: 10.3 ng/mL — ABNORMAL HIGH (ref 0.0–4.7)

## 2019-03-20 ENCOUNTER — Other Ambulatory Visit: Payer: Self-pay

## 2019-03-20 ENCOUNTER — Inpatient Hospital Stay (HOSPITAL_COMMUNITY): Payer: Medicaid Other

## 2019-03-20 VITALS — BP 118/72 | HR 110 | Temp 97.2°F | Resp 18

## 2019-03-20 DIAGNOSIS — C184 Malignant neoplasm of transverse colon: Secondary | ICD-10-CM

## 2019-03-20 DIAGNOSIS — Z5111 Encounter for antineoplastic chemotherapy: Secondary | ICD-10-CM | POA: Diagnosis not present

## 2019-03-20 DIAGNOSIS — C188 Malignant neoplasm of overlapping sites of colon: Secondary | ICD-10-CM

## 2019-03-20 MED ORDER — SODIUM CHLORIDE 0.9% FLUSH
10.0000 mL | INTRAVENOUS | Status: DC | PRN
  Administered 2019-03-20: 10 mL

## 2019-03-20 MED ORDER — HEPARIN SOD (PORK) LOCK FLUSH 100 UNIT/ML IV SOLN
500.0000 [IU] | Freq: Once | INTRAVENOUS | Status: AC | PRN
  Administered 2019-03-20: 500 [IU]

## 2019-03-20 NOTE — Progress Notes (Signed)
Erik [name] presented for Portacath flush and pump d/c.  Portacath located in the left chest wall.  Clean, Dry and Intact No blood return noted. Flushed without resistance.  Portacath flushed with 4ml NS and 500U/65ml Heparin per protocol and needle removed intact. Procedure without incident. Patient tolerated procedure well.  No complaints at this time. Discharged from clinic ambulatory. F/U with [facility] as scheduled.

## 2019-03-20 NOTE — Patient Instructions (Signed)
[facility] Discharge Instructions for Patients Receiving Chemotherapy  Today you received the following chemotherapy agents   To help prevent nausea and vomiting after your treatment, we encourage you to take your nausea medication   If you develop nausea and vomiting that is not controlled by your nausea medication, call the clinic.   BELOW ARE SYMPTOMS THAT SHOULD BE REPORTED IMMEDIATELY:  *FEVER GREATER THAN 100.5 F  *CHILLS WITH OR WITHOUT FEVER  NAUSEA AND VOMITING THAT IS NOT CONTROLLED WITH YOUR NAUSEA MEDICATION  *UNUSUAL SHORTNESS OF BREATH  *UNUSUAL BRUISING OR BLEEDING  TENDERNESS IN MOUTH AND THROAT WITH OR WITHOUT PRESENCE OF ULCERS  *URINARY PROBLEMS  *BOWEL PROBLEMS  UNUSUAL RASH Items with * indicate a potential emergency and should be followed up as soon as possible.  Feel free to call the clinic should you have any questions or concerns. The clinic phone number is [phone].  Please show the CHEMO ALERT CARD at check-in to the Emergency Department and triage nurse.   

## 2019-03-26 ENCOUNTER — Other Ambulatory Visit: Payer: Self-pay

## 2019-03-26 ENCOUNTER — Encounter (HOSPITAL_COMMUNITY): Payer: Self-pay | Admitting: Emergency Medicine

## 2019-03-26 ENCOUNTER — Emergency Department (HOSPITAL_COMMUNITY)
Admission: EM | Admit: 2019-03-26 | Discharge: 2019-03-26 | Disposition: A | Discharge status: Home / Self Care | Payer: Medicaid Other | Attending: Emergency Medicine | Admitting: Emergency Medicine

## 2019-03-26 DIAGNOSIS — I10 Essential (primary) hypertension: Secondary | ICD-10-CM | POA: Diagnosis not present

## 2019-03-26 DIAGNOSIS — Z85038 Personal history of other malignant neoplasm of large intestine: Secondary | ICD-10-CM | POA: Insufficient documentation

## 2019-03-26 DIAGNOSIS — R1031 Right lower quadrant pain: Secondary | ICD-10-CM | POA: Diagnosis not present

## 2019-03-26 DIAGNOSIS — R21 Rash and other nonspecific skin eruption: Secondary | ICD-10-CM | POA: Diagnosis not present

## 2019-03-26 DIAGNOSIS — L0390 Cellulitis, unspecified: Secondary | ICD-10-CM | POA: Insufficient documentation

## 2019-03-26 DIAGNOSIS — R1032 Left lower quadrant pain: Secondary | ICD-10-CM | POA: Insufficient documentation

## 2019-03-26 DIAGNOSIS — N5082 Scrotal pain: Secondary | ICD-10-CM | POA: Insufficient documentation

## 2019-03-26 DIAGNOSIS — R339 Retention of urine, unspecified: Secondary | ICD-10-CM | POA: Diagnosis present

## 2019-03-26 LAB — COMPREHENSIVE METABOLIC PANEL
ALT: 12 U/L (ref 0–44)
AST: 16 U/L (ref 15–41)
Albumin: 3.4 g/dL — ABNORMAL LOW (ref 3.5–5.0)
Alkaline Phosphatase: 87 U/L (ref 38–126)
Anion gap: 13 (ref 5–15)
BUN: 14 mg/dL (ref 8–23)
CO2: 25 mmol/L (ref 22–32)
Calcium: 8.8 mg/dL — ABNORMAL LOW (ref 8.9–10.3)
Chloride: 92 mmol/L — ABNORMAL LOW (ref 98–111)
Creatinine, Ser: 0.96 mg/dL (ref 0.61–1.24)
GFR calc Af Amer: >60 mL/min (ref >60)
GFR calc non Af Amer: >60 mL/min (ref >60)
Glucose, Bld: 113 mg/dL — ABNORMAL HIGH (ref 70–99)
Potassium: 3.5 mmol/L (ref 3.5–5.1)
Sodium: 130 mmol/L — ABNORMAL LOW (ref 135–145)
Total Bilirubin: 1.4 mg/dL — ABNORMAL HIGH (ref 0.3–1.2)
Total Protein: 6.9 g/dL (ref 6.5–8.1)

## 2019-03-26 LAB — CBC
HCT: 38.9 % — ABNORMAL LOW (ref 39.0–52.0)
Hemoglobin: 12.8 g/dL — ABNORMAL LOW (ref 13.0–17.0)
MCH: 32.6 pg (ref 26.0–34.0)
MCHC: 32.9 g/dL (ref 30.0–36.0)
MCV: 99 fL (ref 80.0–100.0)
Platelets: 173 10*3/uL (ref 150–400)
RBC: 3.93 MIL/uL — ABNORMAL LOW (ref 4.22–5.81)
RDW: 16.3 % — ABNORMAL HIGH (ref 11.5–15.5)
WBC: 12.9 10*3/uL — ABNORMAL HIGH (ref 4.0–10.5)
nRBC: 0 % (ref 0.0–0.2)

## 2019-03-26 LAB — URINALYSIS, ROUTINE W REFLEX MICROSCOPIC
Bilirubin Urine: NEGATIVE
Glucose, UA: NEGATIVE mg/dL
Ketones, ur: NEGATIVE mg/dL
Nitrite: POSITIVE — AB
Protein, ur: 30 mg/dL — AB
Specific Gravity, Urine: 1.01 (ref 1.005–1.030)
WBC, UA: >50 WBC/hpf — ABNORMAL HIGH (ref 0–5)
pH: 7 (ref 5.0–8.0)

## 2019-03-26 LAB — DIFFERENTIAL
Abs Immature Granulocytes: 0.09 10*3/uL — ABNORMAL HIGH (ref 0.00–0.07)
Basophils Absolute: 0.1 10*3/uL (ref 0.0–0.1)
Basophils Relative: 1 %
Eosinophils Absolute: 0 10*3/uL (ref 0.0–0.5)
Eosinophils Relative: 0 %
Immature Granulocytes: 1 %
Lymphocytes Relative: 10 %
Lymphs Abs: 1.2 10*3/uL (ref 0.7–4.0)
Monocytes Absolute: 1.4 10*3/uL — ABNORMAL HIGH (ref 0.1–1.0)
Monocytes Relative: 11 %
Neutro Abs: 10 10*3/uL — ABNORMAL HIGH (ref 1.7–7.7)
Neutrophils Relative %: 77 %

## 2019-03-26 MED ORDER — CEPHALEXIN 500 MG PO CAPS: 500.0000 mg | ORAL_CAPSULE | Freq: Three times a day (TID) | ORAL | 0 refills | Status: AC

## 2019-03-26 MED ORDER — NYSTATIN 100000 UNIT/GM EX POWD: 1.0000 "application " | Freq: Four times a day (QID) | CUTANEOUS | 0 refills | Status: DC

## 2019-03-26 MED ORDER — CEPHALEXIN 500 MG PO CAPS: 500.0000 mg | ORAL_CAPSULE | Freq: Three times a day (TID) | ORAL | 0 refills | Status: DC

## 2019-03-26 MED ORDER — NYSTATIN 100000 UNIT/GM EX POWD: 1.0000 "application " | Freq: Four times a day (QID) | CUTANEOUS | 0 refills | Status: AC

## 2019-03-26 MED ORDER — NYSTATIN 100000 UNIT/GM EX POWD
Freq: Once | CUTANEOUS | Status: AC
  Filled 2019-03-26: qty 15

## 2019-03-26 MED ORDER — DOXYCYCLINE HYCLATE 100 MG PO CAPS: 100.0000 mg | ORAL_CAPSULE | Freq: Two times a day (BID) | ORAL | 0 refills | Status: DC

## 2019-03-26 MED ORDER — DOXYCYCLINE HYCLATE 100 MG PO TABS
100.0000 mg | ORAL_TABLET | Freq: Once | ORAL | Status: AC
  Administered 2019-03-26: 16:00:00 100 mg via ORAL
  Filled 2019-03-26: qty 1

## 2019-03-26 NOTE — ED Triage Notes (Signed)
Patient complains of scrotum pain x 2 days with swelling. States has not been able to urinate very well and could barely go this morning. Denies fever chills or having this urinary problems in the past.

## 2019-03-26 NOTE — ED Provider Notes (Signed)
Patient called. Apparently pharmacy did not received Rx. Will re-send electronically   Erik Gambler, MD 03/26/19 [phone]

## 2019-03-26 NOTE — ED Provider Notes (Addendum)
[facility] Emergency Department Provider Note MRN:  [id number]  Arrival date & time: 03/26/19     Chief Complaint   Urinary Retention and Groin Pain   History of Present Illness   Erik [name] is a 64 y.o. year-old male with a history of colon cancer presenting to the ED with chief complaint of urinary retention and groin pain.  2 days of swelling and redness to the groin and scrotum.  Increasing swelling and pain since that time.  Pain is currently 7-10, but becomes more severe with palpation.  Denies fever.  Also endorsing difficulty urinating for the past day.  Denies chest pain or shortness of breath, no abdominal pain.  Review of Systems  A complete 10 system review of systems was obtained and all systems are negative except as noted in the HPI and PMH.   Patient's Health History    Past Medical History:  Diagnosis Date  . Abnormal stress echocardiogram   . Adenocarcinoma of transverse colon ([place]) 02/10/2015  . Alcohol abuse    Heavy Use up until 2010  . Anxiety   . Blood transfusion without reported diagnosis   . Cancer of overlapping sites of colon metastatic to intra-abdominal lymph node ([place]) 12/26/2017  . COPD (chronic obstructive pulmonary disease) ([place])   . Depression   . Emphysema of lung ([place])   . GERD (gastroesophageal reflux disease)   . Head trauma 2001   closed head injury; coma for 4 weeks  . Hypercholesterolemia   . Hypertension   . ING HERN W/GANGREN RECUR UNILAT/UNSPEC ING HERN 04/21/2009   Qualifier: Diagnosis of  By: Verl Blalock, MD, Delanna Ahmadi PTSD (post-traumatic stress disorder)   . Pulmonary nodule 06/14/2016  . SAH (subarachnoid hemorrhage) ([place]) 08/31/2012  . SDH (subdural hematoma) ([place]) 08/31/2012    Past Surgical History:  Procedure Laterality Date  . BIOPSY  01/25/2016   Procedure: BIOPSY;  Surgeon: Danie Binder, MD;  [facility];  Service: Endoscopy;;  ileum;   . cardiac cath    . COLONOSCOPY   2011   Dr. Oneida Alar: multiple adenomas and hyperplastic polyps  . COLONOSCOPY WITH PROPOFOL N/A 01/12/2015   Procedure: COLONOSCOPY WITH PROPOFOL;  Surgeon: Danie Binder, MD;  [facility];  Service: Endoscopy;  Laterality: N/A;  1030  . COLONOSCOPY WITH PROPOFOL N/A 01/25/2016   Procedure: COLONOSCOPY WITH PROPOFOL;  Surgeon: Danie Binder, MD;  [facility];  Service: Endoscopy;  Laterality: N/A;  1230  . CRANIOTOMY  2001  . ESOPHAGOGASTRODUODENOSCOPY (EGD) WITH PROPOFOL N/A 01/12/2015   Procedure: ESOPHAGOGASTRODUODENOSCOPY (EGD) WITH PROPOFOL;  Surgeon: Danie Binder, MD;  [facility];  Service: Endoscopy;  Laterality: N/A;  . head injury surgery    . HERNIA REPAIR Right 2012   Inguinal- [place] Na  . KIDNEY SURGERY     >30 years ago  . LAPAROSCOPIC RIGHT HEMI COLECTOMY Left 02/10/2015   Procedure: LAPAROSCOPIC THEN OPEN RIGHT HEMI COLECTOMY;  Surgeon: [name], MD;  Location: [facility];  Service: General;  Laterality: Left;  . LAPAROTOMY N/A 12/19/2017   Procedure: EXPLORATORY LAPAROTOMY;  Surgeon: Aviva Signs, MD;  Location: [facility];  Service: General;  Laterality: N/A;  . OPEN REDUCTION INTERNAL FIXATION (ORIF) DISTAL RADIAL FRACTURE Right 10/18/2018   Procedure: OPEN REDUCTION INTERNAL FIXATION (ORIF) RIGHT DISTAL RADIAL FRACTURE;  Surgeon: [name], MD;  Location: [facility];  Service: Orthopedics;  Laterality: Right;  block in  preop  . OPEN REDUCTION INTERNAL FIXATION (ORIF) DISTAL RADIAL FRACTURE Right 12/20/2018   Procedure: OPEN REDUCTION INTERNAL FIXATION (ORIF) DISTAL RADIUS AND ULNA FRACTURE;  Surgeon: [name], MD;  Location: [place];  Service: Orthopedics;  Laterality: Right;  block  . POLYPECTOMY  01/25/2016   Procedure: POLYPECTOMY;  Surgeon: Danie Binder, MD;  [facility];  Service: Endoscopy;;  colon  . PORT-A-CATH REMOVAL N/A 02/26/2017   Procedure: MINOR REMOVAL PORT-A-CATH;   Surgeon: Aviva Signs, MD;  Location: [facility];  Service: General;  Laterality: N/A;  Pt to arrive at [place] N/A 03/24/2015   Procedure: INSERTION PORT-A-CATH;  Surgeon: Jules Husbands, MD;  Location: [facility];  Service: General;  Laterality: N/A;  . PORTACATH PLACEMENT Left 01/07/2018   Procedure: INSERTION PORT A CATH (ATTACHED CATHETER IN LEFT SUBCLAVIAN);  Surgeon: Aviva Signs, MD;  Location: [facility];  Service: General;  Laterality: Left;    Family History  Problem Relation Age of Onset  . Pulmonary embolism Mother   . Breast cancer Mother   . Cancer Mother        breast cancer  . Arthritis Mother   . Heart disease Father   . Cancer Father 83       Leukemia  . Cancer Paternal Grandfather        Lung  . Ataxia Neg Hx   . Chorea Neg Hx   . Dementia Neg Hx   . Mental retardation Neg Hx   . Migraines Neg Hx   . Multiple sclerosis Neg Hx   . Neurofibromatosis Neg Hx   . Neuropathy Neg Hx   . Parkinsonism Neg Hx   . Seizures Neg Hx   . Stroke Neg Hx   . Colon cancer Neg Hx     Social History   Socioeconomic History  . Marital status: Divorced    Spouse name: Not on file  . Number of children: 2  . Years of education: 36  . Highest education level: Not on file  Occupational History  . Occupation: disabled/unemployed  . Occupation: unemployed/Mediicaid only    Comment: NO INCOME  Tobacco Use  . Smoking status: Former Smoker    Packs/day: 0.10    Years: 30.00    Pack years: 3.00    Types: Cigarettes    Start date: 02/06/1973    Quit date: 12/09/2017    Years since quitting: 1.2  . Smokeless tobacco: Never Used  . Tobacco comment: Quit on 12/09/2017  Substance and Sexual Activity  . Alcohol use: Yes    Alcohol/week: 0.0 standard drinks    Comment: beer occ, history of ETOH abuse in remote past.   . Drug use: No  . Sexual activity: Never    Partners: Female    Birth control/protection: None  Other Topics Concern  . Not on file  Social History  Narrative   Single   Lives alone   Turned down for disability   Social Determinants of Health   Financial Resource Strain:   . Difficulty of Paying Living Expenses: Not on file  Food Insecurity:   . Worried About Charity fundraiser in the Last Year: Not on file  . Ran Out of Food in the Last Year: Not on file  Transportation Needs:   . Lack of Transportation (Medical): Not on file  . Lack of Transportation (Non-Medical): Not on file  Physical Activity:   . Days of Exercise per Week: Not  on file  . Minutes of Exercise per Session: Not on file  Stress:   . Feeling of Stress : Not on file  Social Connections:   . Frequency of Communication with Friends and Family: Not on file  . Frequency of Social Gatherings with Friends and Family: Not on file  . Attends Religious Services: Not on file  . Active Member of Clubs or Organizations: Not on file  . Attends Archivist Meetings: Not on file  . Marital Status: Not on file  Intimate Partner Violence:   . Fear of Current or Ex-Partner: Not on file  . Emotionally Abused: Not on file  . Physically Abused: Not on file  . Sexually Abused: Not on file     Physical Exam   Vitals:   03/26/19 1026  BP: 114/69  Pulse: 99  Resp: 18  Temp: 97.6 F (36.4 C)  SpO2: 96%    CONSTITUTIONAL: Well-appearing, NAD NEURO:  Alert and oriented x 3, no focal deficits EYES:  eyes equal and reactive ENT/NECK:  no LAD, no JVD CARDIO: Regular rate, well-perfused, normal S1 and S2 PULM:  CTAB no wheezing or rhonchi GI/GU:  normal bowel sounds, non-distended, non-tender MSK/SPINE:  No gross deformities, no edema SKIN: Macular erythematous rash to the bilateral inguinal folds, with adjacent erythema and induration to the scrotum PSYCH:  Appropriate speech and behavior  *Additional and/or pertinent findings included in MDM below  Diagnostic and Interventional Summary    EKG Interpretation  Date/Time:    Ventricular Rate:    PR  Interval:    QRS Duration:   QT Interval:    QTC Calculation:   R Axis:     Text Interpretation:        Cardiac Monitoring Interpretation:  Labs Reviewed  CBC - Abnormal; Notable for the following components:      Result Value   WBC 12.9 (*)    RBC 3.93 (*)    Hemoglobin 12.8 (*)    HCT 38.9 (*)    RDW 16.3 (*)    All other components within normal limits  COMPREHENSIVE METABOLIC PANEL - Abnormal; Notable for the following components:   Sodium 130 (*)    Chloride 92 (*)    Glucose, Bld 113 (*)    Calcium 8.8 (*)    Albumin 3.4 (*)    Total Bilirubin 1.4 (*)    All other components within normal limits  URINALYSIS, ROUTINE W REFLEX MICROSCOPIC - Abnormal; Notable for the following components:   APPearance HAZY (*)    Hgb urine dipstick SMALL (*)    Protein, ur 30 (*)    Nitrite POSITIVE (*)    Leukocytes,Ua MODERATE (*)    WBC, UA >50 (*)    Bacteria, UA RARE (*)    All other components within normal limits  DIFFERENTIAL - Abnormal; Notable for the following components:   Neutro Abs 10.0 (*)    Monocytes Absolute 1.4 (*)    Abs Immature Granulocytes 0.09 (*)    All other components within normal limits    No orders to display    Medications  doxycycline (VIBRA-TABS) tablet 100 mg (has no administration in time range)  nystatin (MYCOSTATIN/NYSTOP) topical powder ( Topical Given 03/26/19 1128)     Procedures  /  Critical Care Procedures  ED Course and Medical Decision Making  I have reviewed the triage vital signs, the nursing notes, and pertinent available records from the EMR.  Pertinent labs & imaging results that were  available during my care of the patient were reviewed by me and considered in my medical decision making (see below for details).     Rash consistent with candidal skin infection, possibly with secondary bacterial cellulitis of the scrotum.  Nontoxic, normal vital signs, no systemic symptoms.  However patient has a history of colon cancer and  is on active maintenance chemotherapy with 5-FU.  Will discuss patient's care with his oncologist, Dr. Delton Coombes.  Would consider admission given his immunocompromise state.  Discussed case with Dr. Gunnar Fusi, so long as patient's [place] is greater than 1500, he will be appropriate for outpatient management.  Awaiting labs.    3:49 PM update: Patient does not have neutropenia, does have UTI.  Providing Keflex to cover for UTI and soft tissue infection, as well as nystatin powder, appropriate for discharge.  Barth Kirks. [place] Small, MD [place] [email or web address]  Final Clinical Impressions(s) / ED Diagnoses     ICD-10-CM   1. Cellulitis, unspecified cellulitis site  L03.90     ED Discharge Orders         Ordered    nystatin (MYCOSTATIN/NYSTOP) powder  4 times daily     03/26/19 1518    doxycycline (VIBRAMYCIN) 100 MG capsule  2 times daily,   Status:  Discontinued     03/26/19 1518    cephALEXin (KEFLEX) 500 MG capsule  3 times daily     03/26/19 1548           Discharge Instructions Discussed with and Provided to Patient:     Discharge Instructions     You were evaluated in the Emergency Department and after careful evaluation, we did not find any emergent condition requiring admission or further testing in the hospital.  Your exam/testing today was overall reassuring.  Please take the medications as prescribed and follow-up with your regular doctors.  Please return to the Emergency Department if you experience any worsening of your condition.  We encourage you to follow up with a primary care provider.  Thank you for allowing [place] to be a part of your care.        Maudie Flakes, MD 03/26/19 1520    Maudie Flakes, MD 03/26/19 1550

## 2019-03-26 NOTE — Discharge Instructions (Addendum)
You were evaluated in the Emergency Department and after careful evaluation, we did not find any emergent condition requiring admission or further testing in the hospital.  Your exam/testing today was overall reassuring.  Please take the medications as prescribed and follow-up with your regular doctors.  Please return to the Emergency Department if you experience any worsening of your condition.  We encourage you to follow up with a primary care provider.  Thank you for allowing [place] to be a part of your care.

## 2019-04-01 ENCOUNTER — Other Ambulatory Visit: Payer: Self-pay

## 2019-04-01 ENCOUNTER — Encounter (HOSPITAL_COMMUNITY): Payer: Self-pay | Admitting: Hematology

## 2019-04-01 ENCOUNTER — Inpatient Hospital Stay (HOSPITAL_BASED_OUTPATIENT_CLINIC_OR_DEPARTMENT_OTHER): Payer: Medicaid Other | Admitting: Hematology

## 2019-04-01 ENCOUNTER — Inpatient Hospital Stay (HOSPITAL_COMMUNITY): Payer: Medicaid Other

## 2019-04-01 VITALS — BP 129/71 | HR 86 | Temp 98.5°F | Resp 18

## 2019-04-01 DIAGNOSIS — C184 Malignant neoplasm of transverse colon: Secondary | ICD-10-CM

## 2019-04-01 DIAGNOSIS — C188 Malignant neoplasm of overlapping sites of colon: Secondary | ICD-10-CM

## 2019-04-01 DIAGNOSIS — Z5111 Encounter for antineoplastic chemotherapy: Secondary | ICD-10-CM | POA: Diagnosis not present

## 2019-04-01 LAB — CBC WITH DIFFERENTIAL/PLATELET
Abs Immature Granulocytes: 0.04 10*3/uL (ref 0.00–0.07)
Basophils Absolute: 0.1 10*3/uL (ref 0.0–0.1)
Basophils Relative: 2 %
Eosinophils Absolute: 0.1 10*3/uL (ref 0.0–0.5)
Eosinophils Relative: 3 %
HCT: 42.8 % (ref 39.0–52.0)
Hemoglobin: 13.5 g/dL (ref 13.0–17.0)
Immature Granulocytes: 1 %
Lymphocytes Relative: 28 %
Lymphs Abs: 1.2 10*3/uL (ref 0.7–4.0)
MCH: 31.7 pg (ref 26.0–34.0)
MCHC: 31.5 g/dL (ref 30.0–36.0)
MCV: 100.5 fL — ABNORMAL HIGH (ref 80.0–100.0)
Monocytes Absolute: 0.8 10*3/uL (ref 0.1–1.0)
Monocytes Relative: 17 %
Neutro Abs: 2.2 10*3/uL (ref 1.7–7.7)
Neutrophils Relative %: 49 %
Platelets: 246 10*3/uL (ref 150–400)
RBC: 4.26 MIL/uL (ref 4.22–5.81)
RDW: 16.7 % — ABNORMAL HIGH (ref 11.5–15.5)
WBC: 4.4 10*3/uL (ref 4.0–10.5)
nRBC: 0 % (ref 0.0–0.2)

## 2019-04-01 LAB — COMPREHENSIVE METABOLIC PANEL
ALT: 17 U/L (ref 0–44)
AST: 21 U/L (ref 15–41)
Albumin: 3.4 g/dL — ABNORMAL LOW (ref 3.5–5.0)
Alkaline Phosphatase: 102 U/L (ref 38–126)
Anion gap: 11 (ref 5–15)
BUN: 10 mg/dL (ref 8–23)
CO2: 29 mmol/L (ref 22–32)
Calcium: 9.2 mg/dL (ref 8.9–10.3)
Chloride: 96 mmol/L — ABNORMAL LOW (ref 98–111)
Creatinine, Ser: 0.92 mg/dL (ref 0.61–1.24)
GFR calc Af Amer: >60 mL/min (ref >60)
GFR calc non Af Amer: >60 mL/min (ref >60)
Glucose, Bld: 133 mg/dL — ABNORMAL HIGH (ref 70–99)
Potassium: 4.2 mmol/L (ref 3.5–5.1)
Sodium: 136 mmol/L (ref 135–145)
Total Bilirubin: 0.5 mg/dL (ref 0.3–1.2)
Total Protein: 7.1 g/dL (ref 6.5–8.1)

## 2019-04-01 LAB — MAGNESIUM: Magnesium: 1.8 mg/dL (ref 1.7–2.4)

## 2019-04-01 MED ORDER — SODIUM CHLORIDE 0.9 % IV SOLN
5.0000 mg/kg | Freq: Once | INTRAVENOUS | Status: AC
  Administered 2019-04-01: 500 mg via INTRAVENOUS
  Filled 2019-04-01: qty 16

## 2019-04-01 MED ORDER — SODIUM CHLORIDE 0.9 % IV SOLN
2400.0000 mg/m2 | INTRAVENOUS | Status: DC
  Administered 2019-04-01: 13:00:00 5650 mg via INTRAVENOUS
  Filled 2019-04-01: qty 113

## 2019-04-01 MED ORDER — SODIUM CHLORIDE 0.9 % IV SOLN
10.0000 mg | Freq: Once | INTRAVENOUS | Status: AC
  Administered 2019-04-01: 10 mg via INTRAVENOUS
  Filled 2019-04-01: qty 10

## 2019-04-01 MED ORDER — FLUOROURACIL CHEMO INJECTION 2.5 GM/50ML
400.0000 mg/m2 | Freq: Once | INTRAVENOUS | Status: AC
  Administered 2019-04-01: 13:00:00 950 mg via INTRAVENOUS
  Filled 2019-04-01: qty 19

## 2019-04-01 MED ORDER — SODIUM CHLORIDE 0.9% FLUSH
10.0000 mL | INTRAVENOUS | Status: DC | PRN
  Administered 2019-04-01: 09:00:00 10 mL

## 2019-04-01 MED ORDER — PALONOSETRON HCL INJECTION 0.25 MG/5ML
0.2500 mg | Freq: Once | INTRAVENOUS | Status: AC
  Administered 2019-04-01: 10:00:00 0.25 mg via INTRAVENOUS
  Filled 2019-04-01: qty 5

## 2019-04-01 MED ORDER — ATROPINE SULFATE 1 MG/ML IJ SOLN
0.5000 mg | Freq: Once | INTRAMUSCULAR | Status: AC
  Administered 2019-04-01: 10:00:00 0.5 mg via INTRAVENOUS
  Filled 2019-04-01: qty 1

## 2019-04-01 MED ORDER — SODIUM CHLORIDE 0.9 % IV SOLN
900.0000 mg | Freq: Once | INTRAVENOUS | Status: AC
  Administered 2019-04-01: 900 mg via INTRAVENOUS
  Filled 2019-04-01: qty 10

## 2019-04-01 MED ORDER — SODIUM CHLORIDE 0.9 % IV SOLN: Freq: Once | INTRAVENOUS | Status: AC

## 2019-04-01 NOTE — Progress Notes (Signed)
Labs reviewed with MD today. Proceed as planned per MD. Madaline Brilliant to give MVASI per MD.  Treatment given per orders. Patient tolerated it well without problems. Vitals stable and discharged home from clinic ambulatory. Follow up as scheduled.

## 2019-04-01 NOTE — Patient Instructions (Signed)
[facility] at [facility] Discharge Instructions  You were seen today by Dr. Katragadda. He went over your recent lab results. He will see you back in 2 weeks for labs, treatment and follow up.   Thank you for choosing  [facility] Center at [facility] to provide your oncology and hematology care.  To afford each patient quality time with our provider, please arrive at least 15 minutes before your scheduled appointment time.   If you have a lab appointment with the Cancer Center please come in thru the  Main Entrance and check in at the main information desk  You need to re-schedule your appointment should you arrive 10 or more minutes late.  We strive to give you quality time with our providers, and arriving late affects you and other patients whose appointments are after yours.  Also, if you no show three or more times for appointments you may be dismissed from the clinic at the providers discretion.     Again, thank you for choosing [facility].  Our hope is that these requests will decrease the amount of time that you wait before being seen by our physicians.       _____________________________________________________________  Should you have questions after your visit to [facility], please contact our office at [phone] between the hours of 8:00 a.m. and 4:30 p.m.  Voicemails left after 4:00 p.m. will not be returned until the following business day.  For prescription refill requests, have your pharmacy contact our office and allow 72 hours.    Cancer Center Support Programs:   > Cancer Support Group  2nd Tuesday of the month 1pm-2pm, Journey Room    

## 2019-04-01 NOTE — Progress Notes (Signed)
Patient has been assessed, vital signs and labs have been reviewed by Dr. Katragadda. ANC, Creatinine, LFTs, and Platelets are within treatment parameters per Dr. Katragadda. The patient is good to proceed with treatment at this time.  

## 2019-04-01 NOTE — Progress Notes (Signed)
Erik [name], [place] [address]   CLINIC:  Medical Oncology/Hematology  PCP:  Alanson Puls, [facility] [place] [address] [phone]   REASON FOR VISIT:  Follow-up for metastatic colon cancer   BRIEF ONCOLOGIC HISTORY:  Oncology History Overview Note  Stage IIIC Erik [name]) adenocarcinoma of transverse colon, diagnosed on colonoscopy by Dr. Oneida Alar on 01/12/2015 followed by definitive surgery by Dr. [name] with right hemicolectomy on 02/12/2015.  He then underwent FOLFOX x 12 cycles in the adjuvant setting (03/30/2015- 09/06/2015).   Malignant neoplasm of transverse colon ([place])  01/12/2015 Pathologic Stage   Colon, biopsy, distal transverse - TUBULOVILLOUS ADENOMA WITH HIGH GRADE DYSPLASIA.   01/12/2015 Procedure   Colonoscopy by Dr. Oneida Alar.   01/12/2015 Tumor Marker   CEA: 6.6 (H)    01/18/2015 Imaging   CT abd/pelvis- Apple-core lesion identified in the mid transverse colon without obstruction. No evidence for lymphadenopathy in the gastrohepatic ligament or omentum.  Stable 8 mm hypo attenuating lesion in the left liver, likely a cyst.   02/10/2015 Initial Diagnosis   Adenocarcinoma of transverse colon ([place])   02/12/2015 Definitive Surgery   [name], Extended right hemicolectomy    02/12/2015 Pathology Results   Mucinous adenocarcinoma with penetration of visceral peritoneum, 4/19 lymph nodes for metastatic disease, negative resection margins, with LVI and perineural invasion   03/30/2015 - 09/06/2015 Chemotherapy   FOLFOX x 12 cycles   05/25/2015 Treatment Plan Change   5 FU bolus discontinued for cycle #5   06/08/2015 Treatment Plan Change   Treatment deferred x 1 week   06/15/2015 Treatment Plan Change   5FU CI decreased by 10% and Oxaliplatin reduced by 15% for cycles #6-#11; Oxaliplatin dropped for cycle #12 d/t neuropathy.    10/20/2015 Imaging   CT CAP- Right hemicolectomy without evidence of  metastatic disease. 2. Previously measured ground-glass lesion in the left upper lobe has resolved. 3. Probable food debris in the stomach, simulating gastric wall thickening. Please correlate clinically. 4. 6 mm irregular nodular density in the left upper lobe, stable. Continued attention on followup exams is warranted.   01/25/2016 Procedure   Colonoscopy by Dr. Oneida Alar- Non-thrombosed external hemorrhoids found on digital rectal exam. - One 4 mm polyp in the rectum, removed with a cold biopsy forceps. Resected and retrieved. - Congested mucosa in the neo-terminal ileum. Biopsied. - Redundant colon. - Internal hemorrhoids.   01/26/2016 Pathology Results   1. Terminal ileum, biopsy - MILD ACUTE (ACTIVE) ILEITIS. - NO DYSPLASIA OR MALIGNANCY IDENTIFIED - SEE COMMENT. 2. Rectum, polyp(s) - HYPERPLASTIC POLYP (X 1). - NO DYSPLASIA OR MALIGNANCY IDENTIFIED.   04/13/2016 Imaging   CT chest- Stable CT chest. 6 mm irregular nodule anterior left upper lobe is stable. The scattered areas of peribronchovascular micro nodularity in the lungs bilaterally are unchanged.   10/19/2016 Imaging   CT CAP: 1. Status post right hemicolectomy. No findings to suggest metastatic disease in the abdomen or pelvis. 2. Aortic atherosclerosis. 3. Additional incidental findings, as above. Aortic Atherosclerosis (ICD10-I70.0).   01/09/2018 -  Chemotherapy   The patient had palonosetron (ALOXI) injection 0.25 mg, 0.25 mg, Intravenous,  Once, 30 of 31 cycles Administration: 0.25 mg (01/09/2018), 0.25 mg (01/23/2018), 0.25 mg (02/11/2018), 0.25 mg (02/25/2018), 0.25 mg (03/12/2018), 0.25 mg (03/27/2018), 0.25 mg (04/10/2018), 0.25 mg (04/24/2018), 0.25 mg (05/08/2018), 0.25 mg (05/22/2018), 0.25 mg (06/10/2018), 0.25 mg (06/26/2018), 0.25 mg (07/10/2018), 0.25 mg (07/24/2018), 0.25 mg (08/13/2018), 0.25 mg (08/27/2018),  0.25 mg (09/10/2018), 0.25 mg (09/24/2018), 0.25 mg (10/08/2018), 0.25 mg (11/05/2018), 0.25 mg (11/19/2018), 0.25 mg  (12/04/2018), 0.25 mg (12/18/2018), 0.25 mg (01/08/2019), 0.25 mg (01/22/2019), 0.25 mg (02/04/2019), 0.25 mg (02/18/2019), 0.25 mg (03/04/2019), 0.25 mg (03/18/2019), 0.25 mg (04/01/2019) irinotecan (CAMPTOSAR) 420 mg in dextrose 5 % 500 mL chemo infusion, 180 mg/m2 = 420 mg, Intravenous,  Once, 9 of 9 cycles Administration: 420 mg (01/09/2018), 420 mg (01/23/2018), 420 mg (02/11/2018), 420 mg (02/25/2018), 420 mg (03/12/2018), 420 mg (03/27/2018), 420 mg (04/10/2018), 420 mg (04/24/2018), 420 mg (05/08/2018) leucovorin 900 mg in dextrose 5 % 250 mL infusion, 944 mg, Intravenous,  Once, 30 of 31 cycles Administration: 900 mg (01/09/2018), 900 mg (01/23/2018), 900 mg (02/11/2018), 900 mg (02/25/2018), 900 mg (03/12/2018), 900 mg (03/27/2018), 900 mg (04/10/2018), 900 mg (04/24/2018), 900 mg (05/08/2018), 900 mg (06/10/2018), 900 mg (06/26/2018), 900 mg (07/10/2018), 900 mg (07/24/2018), 900 mg (08/13/2018), 900 mg (08/27/2018), 900 mg (09/10/2018), 900 mg (09/24/2018), 900 mg (10/08/2018), 900 mg (11/05/2018), 900 mg (11/19/2018), 900 mg (12/04/2018), 900 mg (12/18/2018), 900 mg (01/08/2019), 900 mg (01/22/2019), 900 mg (02/04/2019), 900 mg (02/18/2019), 900 mg (03/04/2019), 900 mg (03/18/2019), 900 mg (04/01/2019) fluorouracil (ADRUCIL) chemo injection 950 mg, 400 mg/m2 = 950 mg, Intravenous,  Once, 30 of 31 cycles Administration: 950 mg (01/09/2018), 950 mg (01/23/2018), 950 mg (02/11/2018), 950 mg (02/25/2018), 950 mg (03/12/2018), 950 mg (03/27/2018), 950 mg (04/10/2018), 950 mg (04/24/2018), 950 mg (05/08/2018), 950 mg (05/22/2018), 950 mg (06/10/2018), 950 mg (06/26/2018), 950 mg (07/10/2018), 950 mg (07/24/2018), 950 mg (08/13/2018), 950 mg (08/27/2018), 950 mg (09/10/2018), 950 mg (09/24/2018), 950 mg (10/08/2018), 950 mg (11/05/2018), 950 mg (11/19/2018), 950 mg (12/04/2018), 950 mg (12/18/2018), 950 mg (01/08/2019), 950 mg (01/22/2019), 950 mg (02/04/2019), 950 mg (02/18/2019), 950 mg (03/04/2019), 950 mg (03/18/2019), 950 mg (04/01/2019) fluorouracil (ADRUCIL) 5,650 mg in sodium  chloride 0.9 % 137 mL chemo infusion, 2,400 mg/m2 = 5,650 mg, Intravenous, 1 Day/Dose, 30 of 31 cycles Administration: 5,650 mg (01/09/2018), 5,650 mg (01/23/2018), 5,650 mg (02/11/2018), 5,650 mg (02/25/2018), 5,650 mg (03/12/2018), 5,650 mg (03/27/2018), 5,650 mg (04/10/2018), 5,650 mg (04/24/2018), 5,650 mg (05/08/2018), 5,650 mg (05/22/2018), 5,650 mg (06/10/2018), 5,650 mg (06/26/2018), 5,650 mg (07/10/2018), 5,650 mg (07/24/2018), 5,650 mg (08/13/2018), 5,650 mg (08/27/2018), 5,650 mg (09/10/2018), 5,650 mg (09/24/2018), 5,650 mg (10/08/2018), 5,650 mg (11/05/2018), 5,650 mg (11/19/2018), 5,650 mg (12/04/2018), 5,650 mg (12/18/2018), 5,650 mg (01/08/2019), 5,650 mg (01/22/2019), 5,650 mg (02/04/2019), 5,650 mg (02/18/2019), 5,650 mg (03/04/2019), 5,650 mg (03/18/2019), 5,650 mg (04/01/2019) bevacizumab-awwb (MVASI) 500 mg in sodium chloride 0.9 % 100 mL chemo infusion, 4.7 mg/kg = 525 mg (100 % of original dose 5 mg/kg), Intravenous,  Once, 1 of 1 cycle Dose modification: 5 mg/kg (original dose 5 mg/kg, Cycle 24) Administration: 500 mg (01/08/2019) bevacizumab-bvzr (ZIRABEV) 500 mg in sodium chloride 0.9 % 100 mL chemo infusion, 5 mg/kg = 500 mg (100 % of original dose 5 mg/kg), Intravenous,  Once, 9 of 10 cycles Dose modification: 5 mg/kg (original dose 5 mg/kg, Cycle 21) Administration: 500 mg (11/19/2018), 500 mg (12/04/2018), 500 mg (12/18/2018), 500 mg (01/22/2019), 500 mg (02/04/2019), 500 mg (02/18/2019), 500 mg (03/04/2019), 500 mg (03/18/2019), 500 mg (04/01/2019)  for chemotherapy treatment.    01/23/2018 Cancer Staging   Staging form: Colon and Rectum, AJCC 7th Edition - Pathologic: M1 - Signed by Zoila Shutter, MD on 01/23/2018   Malignant neoplasm of overlapping sites of colon ([place])  12/26/2017 Initial Diagnosis   Cancer of overlapping sites of colon metastatic to intra-abdominal  lymph node ([place])   01/09/2018 -  Chemotherapy   The patient had palonosetron (ALOXI) injection 0.25 mg, 0.25 mg, Intravenous,  Once, 13 of 14  cycles Administration: 0.25 mg (01/09/2018), 0.25 mg (01/23/2018), 0.25 mg (02/11/2018), 0.25 mg (02/25/2018), 0.25 mg (03/12/2018), 0.25 mg (03/27/2018), 0.25 mg (04/10/2018), 0.25 mg (04/24/2018), 0.25 mg (05/08/2018), 0.25 mg (05/22/2018), 0.25 mg (06/10/2018), 0.25 mg (06/26/2018), 0.25 mg (07/10/2018) irinotecan (CAMPTOSAR) 420 mg in dextrose 5 % 500 mL chemo infusion, 180 mg/m2 = 420 mg, Intravenous,  Once, 9 of 9 cycles Administration: 420 mg (01/09/2018), 420 mg (01/23/2018), 420 mg (02/11/2018), 420 mg (02/25/2018), 420 mg (03/12/2018), 420 mg (03/27/2018), 420 mg (04/10/2018), 420 mg (04/24/2018), 420 mg (05/08/2018) leucovorin 900 mg in dextrose 5 % 250 mL infusion, 944 mg, Intravenous,  Once, 13 of 14 cycles Administration: 900 mg (01/09/2018), 900 mg (01/23/2018), 900 mg (02/11/2018), 900 mg (02/25/2018), 900 mg (03/12/2018), 900 mg (03/27/2018), 900 mg (04/10/2018), 900 mg (04/24/2018), 900 mg (05/08/2018), 900 mg (06/10/2018), 900 mg (06/26/2018), 900 mg (07/10/2018) fluorouracil (ADRUCIL) chemo injection 950 mg, 400 mg/m2 = 950 mg, Intravenous,  Once, 13 of 14 cycles Administration: 950 mg (01/09/2018), 950 mg (01/23/2018), 950 mg (02/11/2018), 950 mg (02/25/2018), 950 mg (03/12/2018), 950 mg (03/27/2018), 950 mg (04/10/2018), 950 mg (04/24/2018), 950 mg (05/08/2018), 950 mg (05/22/2018), 950 mg (06/10/2018), 950 mg (06/26/2018), 950 mg (07/10/2018) fluorouracil (ADRUCIL) 5,650 mg in sodium chloride 0.9 % 137 mL chemo infusion, 2,400 mg/m2 = 5,650 mg, Intravenous, 1 Day/Dose, 13 of 14 cycles Administration: 5,650 mg (01/09/2018), 5,650 mg (01/23/2018), 5,650 mg (02/11/2018), 5,650 mg (02/25/2018), 5,650 mg (03/12/2018), 5,650 mg (03/27/2018), 5,650 mg (04/10/2018), 5,650 mg (04/24/2018), 5,650 mg (05/08/2018), 5,650 mg (05/22/2018), 5,650 mg (06/10/2018), 5,650 mg (06/26/2018), 5,650 mg (07/10/2018)  for chemotherapy treatment.    Colon cancer metastasized to multiple sites [facility])  01/16/2018 Initial Diagnosis   Colon cancer metastasized to multiple sites [facility])     01/23/2018 - 02/10/2018 Chemotherapy   The patient had panitumumab (VECTIBIX) 600 mg in sodium chloride 0.9 % 100 mL chemo infusion, 640 mg, Intravenous,  Once, 1 of 3 cycles Administration: 600 mg (01/23/2018)  for chemotherapy treatment.    03/12/2018 - 10/21/2018 Chemotherapy   The patient had bevacizumab (AVASTIN) 500 mg in sodium chloride 0.9 % 100 mL chemo infusion, 525 mg, Intravenous,  Once, 15 of 22 cycles Administration: 500 mg (03/12/2018), 500 mg (03/27/2018), 500 mg (04/10/2018), 500 mg (04/24/2018), 500 mg (06/10/2018), 500 mg (05/08/2018), 500 mg (05/22/2018), 500 mg (06/26/2018), 500 mg (07/10/2018), 500 mg (07/24/2018), 500 mg (08/13/2018), 500 mg (08/27/2018), 500 mg (09/10/2018), 500 mg (09/24/2018), 500 mg (10/08/2018)  for chemotherapy treatment.       CANCER STAGING: Cancer Staging Malignant neoplasm of transverse colon [facility]) Staging form: Colon and Rectum, AJCC 7th Edition - Pathologic stage from 03/04/2015: Stage IIIC (T4a, N2a, cM0) - Signed by Baird Cancer, PA-C on 03/04/2015 - Pathologic: M1 - Signed by Zoila Shutter, MD on 01/23/2018    INTERVAL HISTORY:  Erik [name] 64 y.o. male seen for follow-up of metastatic colon cancer and toxicity assessment prior to next cycle of chemotherapy.  Reports some diarrhea which is controlled with Lomotil.  He usually needs 2 tablets of Lomotil per day.  He had left-sided nosebleeds which were self-contained x2.  Blood pressure is stable at systolic 762.  Appetite and energy levels are 75%.  Pain in the right hand fracture site is rated as 8 out of 10.  Numbness in the feet has been  stable.  REVIEW OF SYSTEMS:  Review of Systems  HENT:   Positive for nosebleeds.   Gastrointestinal: Positive for diarrhea.  Neurological: Positive for numbness.  All other systems reviewed and are negative.    PAST MEDICAL/SURGICAL HISTORY:  Past Medical History:  Diagnosis Date  . Abnormal stress echocardiogram   . Adenocarcinoma of transverse colon ([place])  02/10/2015  . Alcohol abuse    Heavy Use up until 2010  . Anxiety   . Blood transfusion without reported diagnosis   . Cancer of overlapping sites of colon metastatic to intra-abdominal lymph node ([place]) 12/26/2017  . COPD (chronic obstructive pulmonary disease) ([place])   . Depression   . Emphysema of lung ([place])   . GERD (gastroesophageal reflux disease)   . Head trauma 2001   closed head injury; coma for 4 weeks  . Hypercholesterolemia   . Hypertension   . ING HERN W/GANGREN RECUR UNILAT/UNSPEC ING HERN 04/21/2009   Qualifier: Diagnosis of  By: Verl Blalock, MD, Delanna Ahmadi PTSD (post-traumatic stress disorder)   . Pulmonary nodule 06/14/2016  . SAH (subarachnoid hemorrhage) ([place]) 08/31/2012  . SDH (subdural hematoma) ([place]) 08/31/2012   Past Surgical History:  Procedure Laterality Date  . BIOPSY  01/25/2016   Procedure: BIOPSY;  Surgeon: Danie Binder, MD;  [facility];  Service: Endoscopy;;  ileum;   . cardiac cath    . COLONOSCOPY  2011   Dr. Oneida Alar: multiple adenomas and hyperplastic polyps  . COLONOSCOPY WITH PROPOFOL N/A 01/12/2015   Procedure: COLONOSCOPY WITH PROPOFOL;  Surgeon: Danie Binder, MD;  [facility];  Service: Endoscopy;  Laterality: N/A;  1030  . COLONOSCOPY WITH PROPOFOL N/A 01/25/2016   Procedure: COLONOSCOPY WITH PROPOFOL;  Surgeon: Danie Binder, MD;  [facility];  Service: Endoscopy;  Laterality: N/A;  1230  . CRANIOTOMY  2001  . ESOPHAGOGASTRODUODENOSCOPY (EGD) WITH PROPOFOL N/A 01/12/2015   Procedure: ESOPHAGOGASTRODUODENOSCOPY (EGD) WITH PROPOFOL;  Surgeon: Danie Binder, MD;  [facility];  Service: Endoscopy;  Laterality: N/A;  . head injury surgery    . HERNIA REPAIR Right 2012   Inguinal- [place]ine Na  . KIDNEY SURGERY     >30 years ago  . LAPAROSCOPIC RIGHT HEMI COLECTOMY Left 02/10/2015   Procedure: LAPAROSCOPIC THEN OPEN RIGHT HEMI COLECTOMY;  Surgeon: [name], MD;  Location: [facility];  Service:  General;  Laterality: Left;  . LAPAROTOMY N/A 12/19/2017   Procedure: EXPLORATORY LAPAROTOMY;  Surgeon: Aviva Signs, MD;  Location: [facility];  Service: General;  Laterality: N/A;  . OPEN REDUCTION INTERNAL FIXATION (ORIF) DISTAL RADIAL FRACTURE Right 10/18/2018   Procedure: OPEN REDUCTION INTERNAL FIXATION (ORIF) RIGHT DISTAL RADIAL FRACTURE;  Surgeon: [name], MD;  Location: [place];  Service: Orthopedics;  Laterality: Right;  block in preop  . OPEN REDUCTION INTERNAL FIXATION (ORIF) DISTAL RADIAL FRACTURE Right 12/20/2018   Procedure: OPEN REDUCTION INTERNAL FIXATION (ORIF) DISTAL RADIUS AND ULNA FRACTURE;  Surgeon: [name], MD;  Location: [place];  Service: Orthopedics;  Laterality: Right;  block  . POLYPECTOMY  01/25/2016   Procedure: POLYPECTOMY;  Surgeon: Danie Binder, MD;  [facility];  Service: Endoscopy;;  colon  . PORT-A-CATH REMOVAL N/A 02/26/2017   Procedure: MINOR REMOVAL PORT-A-CATH;  Surgeon: Aviva Signs, MD;  Location: [facility];  Service: General;  Laterality: N/A;  Pt to arrive at 8:00am  . PORTACATH PLACEMENT N/A 03/24/2015   Procedure: INSERTION  PORT-A-CATH;  Surgeon: Jules Husbands, MD;  Location: [facility];  Service: General;  Laterality: N/A;  . PORTACATH PLACEMENT Left 01/07/2018   Procedure: INSERTION PORT A CATH (ATTACHED CATHETER IN LEFT SUBCLAVIAN);  Surgeon: Aviva Signs, MD;  Location[facility];  Service: General;  Laterality: Left;     SOCIAL HISTORY:  Social History   Socioeconomic History  . Marital status: Divorced    Spouse name: Not on file  . Number of children: 2  . Years of education: 44  . Highest education level: Not on file  Occupational History  . Occupation: disabled/unemployed  . Occupation: unemployed/Mediicaid only    Comment: NO INCOME  Tobacco Use  . Smoking status: Former Smoker    Packs/day: 0.10    Years: 30.00    Pack years: 3.00    Types: Cigarettes    Start date: 02/06/1973     Quit date: 12/09/2017    Years since quitting: 1.3  . Smokeless tobacco: Never Used  . Tobacco comment: Quit on 12/09/2017  Substance and Sexual Activity  . Alcohol use: Yes    Alcohol/week: 0.0 standard drinks    Comment: beer occ, history of ETOH abuse in remote past.   . Drug use: No  . Sexual activity: Never    Partners: Female    Birth control/protection: None  Other Topics Concern  . Not on file  Social History Narrative   Single   Lives alone   Turned down for disability   Social Determinants of Health   Financial Resource Strain:   . Difficulty of Paying Living Expenses: Not on file  Food Insecurity:   . Worried About Charity fundraiser in the Last Year: Not on file  . Ran Out of Food in the Last Year: Not on file  Transportation Needs:   . Lack of Transportation (Medical): Not on file  . Lack of Transportation (Non-Medical): Not on file  Physical Activity:   . Days of Exercise per Week: Not on file  . Minutes of Exercise per Session: Not on file  Stress:   . Feeling of Stress : Not on file  Social Connections:   . Frequency of Communication with Friends and Family: Not on file  . Frequency of Social Gatherings with Friends and Family: Not on file  . Attends Religious Services: Not on file  . Active Member of Clubs or Organizations: Not on file  . Attends Archivist Meetings: Not on file  . Marital Status: Not on file  Intimate Partner Violence:   . Fear of Current or Ex-Partner: Not on file  . Emotionally Abused: Not on file  . Physically Abused: Not on file  . Sexually Abused: Not on file    FAMILY HISTORY:  Family History  Problem Relation Age of Onset  . Pulmonary embolism Mother   . Breast cancer Mother   . Cancer Mother        breast cancer  . Arthritis Mother   . Heart disease Father   . Cancer Father 56       Leukemia  . Cancer Paternal Grandfather        Lung  . Ataxia Neg Hx   . Chorea Neg Hx   . Dementia Neg Hx   . Mental  retardation Neg Hx   . Migraines Neg Hx   . Multiple sclerosis Neg Hx   . Neurofibromatosis Neg Hx   . Neuropathy Neg Hx   . Parkinsonism Neg Hx   .  Seizures Neg Hx   . Stroke Neg Hx   . Colon cancer Neg Hx     CURRENT MEDICATIONS:  Outpatient Encounter Medications as of 04/01/2019  Medication Sig Note  . amitriptyline (ELAVIL) 25 MG tablet Take 1 tablet (25 mg total) by mouth at bedtime.   Marland Kitchen atorvastatin (LIPITOR) 20 MG tablet TAKE 1 TABLET BY MOUTH AT BEDTIME (Patient taking differently: Take 20 mg by mouth daily. )   . buPROPion (WELLBUTRIN SR) 150 MG 12 hr tablet Take 150 mg by mouth every morning.    . busPIRone (BUSPAR) 10 MG tablet Take 10 mg by mouth 2 (two) times daily.   . fluorouracil CALGB 94765 in sodium chloride 0.9 % 150 mL Inject 5,650 mg into the vein. Over 46 hours   . FLUoxetine (PROZAC) 20 MG capsule TAKE 1 CAPSULE BY MOUTH 2 TIMES A DAY (Patient taking differently: Take 20 mg by mouth 2 (two) times daily. ) 06/10/2018: 1 tab TID  . gabapentin (NEURONTIN) 300 MG capsule TAKE 1 CAPSULE BY MOUTH THREE TIMES A DAY   . IRINOTECAN HCL IV Inject 420 mg into the vein every 14 (fourteen) days.   Marland Kitchen LEUCOVORIN CALCIUM IV Inject 944 mg into the vein every 14 (fourteen) days.   Marland Kitchen lidocaine-prilocaine (EMLA) cream APPLY A SMALL AMOUNT OVER PORT SITE AND COVER WITH PLASTIC WRAP ONE HOUR PRIOR TO APPOINTMENT   . loperamide (IMODIUM) 2 MG capsule TAKE 2 CAPSULES BY MOUTH TWICE DAILY   . nystatin (MYCOSTATIN/NYSTOP) powder Apply 1 application topically 4 (four) times daily.   Marland Kitchen omeprazole (PRILOSEC) 20 MG capsule Take 20 mg by mouth every morning.    . ondansetron (ZOFRAN) 8 MG tablet Take 1 tablet (8 mg total) by mouth 2 (two) times daily as needed for refractory nausea / vomiting. Start on day 3 after chemotherapy.   Marland Kitchen PROAIR HFA 108 (90 Base) MCG/ACT inhaler INHALE 2 PUFFS BY MOUTH EVERY 6 HOURS AS NEEDED FOR SHORTNESS OF BREATH/WHEEZING.   Marland Kitchen prochlorperazine (COMPAZINE) 10 MG tablet  Take 1 tablet (10 mg total) by mouth every 6 (six) hours as needed (NAUSEA).   . rivaroxaban (XARELTO) 20 MG TABS tablet Take 1 tablet (20 mg total) by mouth daily with supper.   . Vitamin D, Ergocalciferol, (DRISDOL) 1.25 MG (50000 UT) CAPS capsule Take 50,000 Units by mouth once a week.   . cephALEXin (KEFLEX) 500 MG capsule Take 1 capsule (500 mg total) by mouth 3 (three) times daily for 7 days. (Patient not taking: Reported on 04/01/2019)    Facility-Administered Encounter Medications as of 04/01/2019  Medication  . sodium chloride flush (NS) 0.9 % injection 10 mL    ALLERGIES:  No Known Allergies   PHYSICAL EXAM:  ECOG Performance status: 1  Vitals:   04/01/19 0836  BP: 129/67  Pulse: 100  Resp: 18  Temp: (!) 97.1 F (36.2 C)  SpO2: 98%   Filed Weights   04/01/19 0836  Weight: 213 lb 6.4 oz (96.8 kg)    Physical Exam Vitals reviewed.  Constitutional:      Appearance: Normal appearance.  Cardiovascular:     Rate and Rhythm: Normal rate and regular rhythm.     Heart sounds: Normal heart sounds.  Pulmonary:     Effort: Pulmonary effort is normal.     Breath sounds: Normal breath sounds.  Abdominal:     General: There is no distension.     Palpations: Abdomen is soft. There is no mass.  Musculoskeletal:  General: No swelling.  Skin:    General: Skin is warm.  Neurological:     General: No focal deficit present.     Mental Status: He is alert and oriented to person, place, and time.  Psychiatric:        Mood and Affect: Mood normal.        Behavior: Behavior normal.      LABORATORY DATA:  I have reviewed the labs as listed.  CBC    Component Value Date/Time   WBC 4.4 04/01/2019 0816   RBC 4.26 04/01/2019 0816   HGB 13.5 04/01/2019 0816   HCT 42.8 04/01/2019 0816   PLT 246 04/01/2019 0816   MCV 100.5 (H) 04/01/2019 0816   MCH 31.7 04/01/2019 0816   MCHC 31.5 04/01/2019 0816   RDW 16.7 (H) 04/01/2019 0816   LYMPHSABS 1.2 04/01/2019 0816    MONOABS 0.8 04/01/2019 0816   EOSABS 0.1 04/01/2019 0816   BASOSABS 0.1 04/01/2019 0816   CMP Latest Ref Rng & Units 04/01/2019 03/26/2019 03/18/2019  Glucose 70 - 99 mg/dL 133(H) 113(H) 125(H)  BUN 8 - 23 mg/dL _0 Creatinine 0.61 - 1.24 mg/dL 0.92 0.96 0.88  Sodium 135 - 145 mmol/L 136 130(L) 137  Potassium 3.5 - 5.1 mmol/L 4.2 3.5 4.6  Chloride 98 - 111 mmol/L 96(L) 92(L) 100  CO2 22 - 32 mmol/L _1 Calcium 8.9 - 10.3 mg/dL 9.2 8.8(L) 9.4  Total Protein 6.5 - 8.1 g/dL 7.1 6.9 7.3  Total Bilirubin 0.3 - 1.2 mg/dL 0.5 1.4(H) 0.6  Alkaline Phos 38 - 126 U/L 102 87 109  AST 15 - 41 U/L _2 ALT 0 - 44 U/L _3 DIAGNOSTIC IMAGING:  I have independently reviewed the scans and discussed with the patient.   I have reviewed Venita Lick LPN's note and agree with the documentation.  I personally performed a face-to-face visit, made revisions and my assessment and plan is as follows.    ASSESSMENT & PLAN:   Malignant neoplasm of transverse colon ([place]) 1.  Metastatic colon cancer, K-ras mutation positive, MSI stable: -Maintenance 5-FU/leucovorin and bevacizumab started on 05/22/2018. -CT CAP independently reviewed by me on 03/01/2019 showed peritoneal/omental disease is stable. -We reviewed his CBC and LFTs today which are adequate to proceed with his treatment.  Last CEA was 10.3 on 03/18/2019. -We will proceed with his treatment today.  2.  Pulmonary embolism: -Incidental PE on chest CT on 05/20/2018. -He is continuing to tolerate Xarelto without any problems.  3.  Peripheral neuropathy: -Numbness in the feet has been stable from prior oxaliplatin therapy.  4.  Diarrhea: -He is taking Lomotil averaging 2 tablets/day.  Diarrhea is well controlled.  5.  Multiple falls: -He has history of multiple falls since he had a head injury many years ago.  He does not usually get any warning signs. -Latest MRI of the brain did not show any abnormality. -He had a  fracture of the right wrist and right medial clavicle. -He follows up with Dr. Fredna Dow.     Orders placed this encounter:  No orders of the defined types were placed in this encounter.     Derek Jack, MD [place] [phone]

## 2019-04-01 NOTE — Assessment & Plan Note (Signed)
1.  Metastatic colon cancer, K-ras mutation positive, MSI stable: -Maintenance 5-FU/leucovorin and bevacizumab started on 05/22/2018. -CT CAP independently reviewed by me on 03/01/2019 showed peritoneal/omental disease is stable. -We reviewed his CBC and LFTs today which are adequate to proceed with his treatment.  Last CEA was 10.3 on 03/18/2019. -We will proceed with his treatment today.  2.  Pulmonary embolism: -Incidental PE on chest CT on 05/20/2018. -He is continuing to tolerate Xarelto without any problems.  3.  Peripheral neuropathy: -Numbness in the feet has been stable from prior oxaliplatin therapy.  4.  Diarrhea: -He is taking Lomotil averaging 2 tablets/day.  Diarrhea is well controlled.  5.  Multiple falls: -He has history of multiple falls since he had a head injury many years ago.  He does not usually get any warning signs. -Latest MRI of the brain did not show any abnormality. -He had a fracture of the right wrist and right medial clavicle. -He follows up with Dr. Fredna Dow.

## 2019-04-01 NOTE — Patient Instructions (Signed)
[facility] at [facility] Discharge Instructions  Labs drawn from portacath today   Thank you for choosing [facility] at [facility] to provide your oncology and hematology care.  To afford each patient quality time with our provider, please arrive at least 15 minutes before your scheduled appointment time.   If you have a lab appointment with the Cancer Center please come in thru the Main Entrance and check in at the main information desk.  You need to re-schedule your appointment should you arrive 10 or more minutes late.  We strive to give you quality time with our providers, and arriving late affects you and other patients whose appointments are after yours.  Also, if you no show three or more times for appointments you may be dismissed from the clinic at the providers discretion.     Again, thank you for choosing [facility].  Our hope is that these requests will decrease the amount of time that you wait before being seen by our physicians.       _____________________________________________________________  Should you have questions after your visit to [facility], please contact our office at [phone] between the hours of 8:00 a.m. and 4:30 p.m.  Voicemails left after 4:00 p.m. will not be returned until the following business day.  For prescription refill requests, have your pharmacy contact our office and allow 72 hours.    Due to Covid, you will need to wear a mask upon entering the hospital. If you do not have a mask, a mask will be given to you at the Main Entrance upon arrival. For doctor visits, patients may have 1 support person with them. For treatment visits, patients can not have anyone with them due to social distancing guidelines and our immunocompromised population.     

## 2019-04-02 LAB — CEA: CEA: 12 ng/mL — ABNORMAL HIGH (ref 0.0–4.7)

## 2019-04-03 ENCOUNTER — Other Ambulatory Visit: Payer: Self-pay

## 2019-04-03 ENCOUNTER — Encounter (HOSPITAL_COMMUNITY): Payer: Self-pay

## 2019-04-03 ENCOUNTER — Inpatient Hospital Stay (HOSPITAL_COMMUNITY): Payer: Medicaid Other

## 2019-04-03 VITALS — BP 138/76 | HR 97 | Temp 97.1°F | Resp 18

## 2019-04-03 DIAGNOSIS — C184 Malignant neoplasm of transverse colon: Secondary | ICD-10-CM

## 2019-04-03 DIAGNOSIS — Z5111 Encounter for antineoplastic chemotherapy: Secondary | ICD-10-CM | POA: Diagnosis not present

## 2019-04-03 DIAGNOSIS — C188 Malignant neoplasm of overlapping sites of colon: Secondary | ICD-10-CM

## 2019-04-03 MED ORDER — HEPARIN SOD (PORK) LOCK FLUSH 100 UNIT/ML IV SOLN
500.0000 [IU] | Freq: Once | INTRAVENOUS | Status: AC | PRN
  Administered 2019-04-03: 500 [IU]

## 2019-04-03 MED ORDER — SODIUM CHLORIDE 0.9% FLUSH
10.0000 mL | INTRAVENOUS | Status: DC | PRN
  Administered 2019-04-03: 10 mL

## 2019-04-03 NOTE — Progress Notes (Signed)
Chemotherapy pump disconnected.  Patients port flushed without difficulty.  Good blood return noted with no bruising or swelling noted at site.  Band aid applied.  VSS with discharge and left ambulatory with no s/s of distress noted.  

## 2019-04-14 ENCOUNTER — Other Ambulatory Visit: Payer: Self-pay

## 2019-04-14 ENCOUNTER — Inpatient Hospital Stay (HOSPITAL_COMMUNITY): Payer: Medicaid Other | Attending: Hematology

## 2019-04-14 ENCOUNTER — Encounter (HOSPITAL_COMMUNITY): Payer: Self-pay

## 2019-04-14 ENCOUNTER — Inpatient Hospital Stay (HOSPITAL_COMMUNITY): Payer: Medicaid Other

## 2019-04-14 ENCOUNTER — Inpatient Hospital Stay (HOSPITAL_BASED_OUTPATIENT_CLINIC_OR_DEPARTMENT_OTHER): Payer: Medicaid Other | Admitting: Hematology

## 2019-04-14 VITALS — BP 164/80 | HR 99 | Temp 98.1°F | Resp 18

## 2019-04-14 DIAGNOSIS — C184 Malignant neoplasm of transverse colon: Secondary | ICD-10-CM | POA: Diagnosis not present

## 2019-04-14 DIAGNOSIS — Z5111 Encounter for antineoplastic chemotherapy: Secondary | ICD-10-CM | POA: Diagnosis present

## 2019-04-14 DIAGNOSIS — C188 Malignant neoplasm of overlapping sites of colon: Secondary | ICD-10-CM

## 2019-04-14 DIAGNOSIS — Z5112 Encounter for antineoplastic immunotherapy: Secondary | ICD-10-CM | POA: Insufficient documentation

## 2019-04-14 LAB — COMPREHENSIVE METABOLIC PANEL
ALT: 12 U/L (ref 0–44)
AST: 15 U/L (ref 15–41)
Albumin: 3.5 g/dL (ref 3.5–5.0)
Alkaline Phosphatase: 94 U/L (ref 38–126)
Anion gap: 9 (ref 5–15)
BUN: 9 mg/dL (ref 8–23)
CO2: 30 mmol/L (ref 22–32)
Calcium: 9.2 mg/dL (ref 8.9–10.3)
Chloride: 99 mmol/L (ref 98–111)
Creatinine, Ser: 0.9 mg/dL (ref 0.61–1.24)
GFR calc Af Amer: >60 mL/min (ref >60)
GFR calc non Af Amer: >60 mL/min (ref >60)
Glucose, Bld: 113 mg/dL — ABNORMAL HIGH (ref 70–99)
Potassium: 4.5 mmol/L (ref 3.5–5.1)
Sodium: 138 mmol/L (ref 135–145)
Total Bilirubin: 0.5 mg/dL (ref 0.3–1.2)
Total Protein: 6.9 g/dL (ref 6.5–8.1)

## 2019-04-14 LAB — CBC WITH DIFFERENTIAL/PLATELET
Abs Immature Granulocytes: 0.02 10*3/uL (ref 0.00–0.07)
Basophils Absolute: 0.1 10*3/uL (ref 0.0–0.1)
Basophils Relative: 1 %
Eosinophils Absolute: 0.1 10*3/uL (ref 0.0–0.5)
Eosinophils Relative: 1 %
HCT: 42.9 % (ref 39.0–52.0)
Hemoglobin: 13.7 g/dL (ref 13.0–17.0)
Immature Granulocytes: 0 %
Lymphocytes Relative: 17 %
Lymphs Abs: 1 10*3/uL (ref 0.7–4.0)
MCH: 32.5 pg (ref 26.0–34.0)
MCHC: 31.9 g/dL (ref 30.0–36.0)
MCV: 101.7 fL — ABNORMAL HIGH (ref 80.0–100.0)
Monocytes Absolute: 0.6 10*3/uL (ref 0.1–1.0)
Monocytes Relative: 11 %
Neutro Abs: 4.1 10*3/uL (ref 1.7–7.7)
Neutrophils Relative %: 70 %
Platelets: 217 10*3/uL (ref 150–400)
RBC: 4.22 MIL/uL (ref 4.22–5.81)
RDW: 17.2 % — ABNORMAL HIGH (ref 11.5–15.5)
WBC: 5.8 10*3/uL (ref 4.0–10.5)
nRBC: 0 % (ref 0.0–0.2)

## 2019-04-14 LAB — MAGNESIUM: Magnesium: 2.2 mg/dL (ref 1.7–2.4)

## 2019-04-14 MED ORDER — ATROPINE SULFATE 1 MG/ML IJ SOLN
0.5000 mg | Freq: Once | INTRAMUSCULAR | Status: AC
  Administered 2019-04-14: 0.5 mg via INTRAVENOUS
  Filled 2019-04-14: qty 1

## 2019-04-14 MED ORDER — FLUOROURACIL CHEMO INJECTION 2.5 GM/50ML
400.0000 mg/m2 | Freq: Once | INTRAVENOUS | Status: AC
  Administered 2019-04-14: 950 mg via INTRAVENOUS
  Filled 2019-04-14: qty 19

## 2019-04-14 MED ORDER — SODIUM CHLORIDE 0.9 % IV SOLN
5.0000 mg/kg | Freq: Once | INTRAVENOUS | Status: AC
  Administered 2019-04-14: 500 mg via INTRAVENOUS
  Filled 2019-04-14: qty 4

## 2019-04-14 MED ORDER — SODIUM CHLORIDE 0.9 % IV SOLN
10.0000 mg | Freq: Once | INTRAVENOUS | Status: AC
  Administered 2019-04-14: 10 mg via INTRAVENOUS
  Filled 2019-04-14: qty 10

## 2019-04-14 MED ORDER — SODIUM CHLORIDE 0.9% FLUSH
10.0000 mL | INTRAVENOUS | Status: DC | PRN
  Administered 2019-04-14: 10 mL

## 2019-04-14 MED ORDER — PALONOSETRON HCL INJECTION 0.25 MG/5ML
0.2500 mg | Freq: Once | INTRAVENOUS | Status: AC
  Administered 2019-04-14: 0.25 mg via INTRAVENOUS
  Filled 2019-04-14: qty 5

## 2019-04-14 MED ORDER — SODIUM CHLORIDE 0.9 % IV SOLN
900.0000 mg | Freq: Once | INTRAVENOUS | Status: AC
  Administered 2019-04-14: 900 mg via INTRAVENOUS
  Filled 2019-04-14: qty 35

## 2019-04-14 MED ORDER — SODIUM CHLORIDE 0.9 % IV SOLN
2400.0000 mg/m2 | INTRAVENOUS | Status: DC
  Administered 2019-04-14: 11:00:00 5650 mg via INTRAVENOUS
  Filled 2019-04-14: qty 100

## 2019-04-14 MED ORDER — SODIUM CHLORIDE 0.9 % IV SOLN: Freq: Once | INTRAVENOUS | Status: AC

## 2019-04-14 NOTE — Patient Instructions (Signed)
[facility] Discharge Instructions for Patients Receiving Chemotherapy   Beginning January 23rd 2017 lab work for the Cancer Center will be done in the  Main lab at [place] on 1st floor. If you have a lab appointment with the Cancer Center please come in thru the  Main Entrance and check in at the main information desk   Today you received the following chemotherapy agents Avastin,Leucovorin and 5FU. Follow-up as scheduled. Call clinic for any questions or concerns  To help prevent nausea and vomiting after your treatment, we encourage you to take your nausea medication    If you develop nausea and vomiting, or diarrhea that is not controlled by your medication, call the clinic.  The clinic phone number is [phone]. Office hours are Monday-Friday 8:30am-5:00pm.  BELOW ARE SYMPTOMS THAT SHOULD BE REPORTED IMMEDIATELY:  *FEVER GREATER THAN 101.0 F  *CHILLS WITH OR WITHOUT FEVER  NAUSEA AND VOMITING THAT IS NOT CONTROLLED WITH YOUR NAUSEA MEDICATION  *UNUSUAL SHORTNESS OF BREATH  *UNUSUAL BRUISING OR BLEEDING  TENDERNESS IN MOUTH AND THROAT WITH OR WITHOUT PRESENCE OF ULCERS  *URINARY PROBLEMS  *BOWEL PROBLEMS  UNUSUAL RASH Items with * indicate a potential emergency and should be followed up as soon as possible. If you have an emergency after office hours please contact your primary care physician or go to the nearest emergency department.  Please call the clinic during office hours if you have any questions or concerns.   You may also contact the Patient Navigator at [phone] should you have any questions or need assistance in obtaining follow up care.      Resources For Cancer Patients and their Caregivers ? [employer]: Can assist with transportation, wigs, general needs, runs Look Good Feel Better.        1-888-227-6333 ? Cancer Care: Provides financial assistance, online support groups, medication/co-pay assistance.   1-800-813-HOPE (4673) ? [facility] Assists [place] cancer patients and their families through emotional , educational and financial support.  336-427-4357 ? [place] DSS Where to apply for food stamps, Medicaid and utility assistance. 336-342-1394 ? [facility]: Transportation to medical appointments. 336-347-2287 ? Social Security Administration: May apply for disability if have a Stage IV cancer. 336-342-7796 1-800-772-1213 ? [place] [facility] Transit [facility]: Assists with nutrition, care and transit needs. 336-349-2343         

## 2019-04-14 NOTE — Progress Notes (Signed)
[place], [place] [address]   CLINIC:  Medical Oncology/Hematology  PCP:  Alanson Puls, [facility] [place] [address] [phone]   REASON FOR VISIT:  Follow-up for metastatic colon cancer   BRIEF ONCOLOGIC HISTORY:  Oncology History Overview Note  Stage IIIC Erik [name]) adenocarcinoma of transverse colon, diagnosed on colonoscopy by Dr. Oneida Alar on 01/12/2015 followed by definitive surgery by Dr. [name] with right hemicolectomy on 02/12/2015.  He then underwent FOLFOX x 12 cycles in the adjuvant setting (03/30/2015- 09/06/2015).   Malignant neoplasm of transverse colon ([place])  01/12/2015 Pathologic Stage   Colon, biopsy, distal transverse - TUBULOVILLOUS ADENOMA WITH HIGH GRADE DYSPLASIA.   01/12/2015 Procedure   Colonoscopy by Dr. Oneida Alar.   01/12/2015 Tumor Marker   CEA: 6.6 (H)    01/18/2015 Imaging   CT abd/pelvis- Apple-core lesion identified in the mid transverse colon without obstruction. No evidence for lymphadenopathy in the gastrohepatic ligament or omentum.  Stable 8 mm hypo attenuating lesion in the left liver, likely a cyst.   02/10/2015 Initial Diagnosis   Adenocarcinoma of transverse colon ([place])   02/12/2015 Definitive Surgery   [name], Extended right hemicolectomy    02/12/2015 Pathology Results   Mucinous adenocarcinoma with penetration of visceral peritoneum, 4/19 lymph nodes for metastatic disease, negative resection margins, with LVI and perineural invasion   03/30/2015 - 09/06/2015 Chemotherapy   FOLFOX x 12 cycles   05/25/2015 Treatment Plan Change   5 FU bolus discontinued for cycle #5   06/08/2015 Treatment Plan Change   Treatment deferred x 1 week   06/15/2015 Treatment Plan Change   5FU CI decreased by 10% and Oxaliplatin reduced by 15% for cycles #6-#11; Oxaliplatin dropped for cycle #12 d/t neuropathy.    10/20/2015 Imaging   CT CAP- Right hemicolectomy without evidence of  metastatic disease. 2. Previously measured ground-glass lesion in the left upper lobe has resolved. 3. Probable food debris in the stomach, simulating gastric wall thickening. Please correlate clinically. 4. 6 mm irregular nodular density in the left upper lobe, stable. Continued attention on followup exams is warranted.   01/25/2016 Procedure   Colonoscopy by Dr. Oneida Alar- Non-thrombosed external hemorrhoids found on digital rectal exam. - One 4 mm polyp in the rectum, removed with a cold biopsy forceps. Resected and retrieved. - Congested mucosa in the neo-terminal ileum. Biopsied. - Redundant colon. - Internal hemorrhoids.   01/26/2016 Pathology Results   1. Terminal ileum, biopsy - MILD ACUTE (ACTIVE) ILEITIS. - NO DYSPLASIA OR MALIGNANCY IDENTIFIED - SEE COMMENT. 2. Rectum, polyp(s) - HYPERPLASTIC POLYP (X 1). - NO DYSPLASIA OR MALIGNANCY IDENTIFIED.   04/13/2016 Imaging   CT chest- Stable CT chest. 6 mm irregular nodule anterior left upper lobe is stable. The scattered areas of peribronchovascular micro nodularity in the lungs bilaterally are unchanged.   10/19/2016 Imaging   CT CAP: 1. Status post right hemicolectomy. No findings to suggest metastatic disease in the abdomen or pelvis. 2. Aortic atherosclerosis. 3. Additional incidental findings, as above. Aortic Atherosclerosis (ICD10-I70.0).   01/09/2018 -  Chemotherapy   The patient had palonosetron (ALOXI) injection 0.25 mg, 0.25 mg, Intravenous,  Once, 31 of 35 cycles Administration: 0.25 mg (01/09/2018), 0.25 mg (01/23/2018), 0.25 mg (02/11/2018), 0.25 mg (02/25/2018), 0.25 mg (03/12/2018), 0.25 mg (03/27/2018), 0.25 mg (04/10/2018), 0.25 mg (04/24/2018), 0.25 mg (05/08/2018), 0.25 mg (05/22/2018), 0.25 mg (06/10/2018), 0.25 mg (06/26/2018), 0.25 mg (07/10/2018), 0.25 mg (07/24/2018), 0.25 mg (08/13/2018), 0.25 mg (08/27/2018),  0.25 mg (09/10/2018), 0.25 mg (09/24/2018), 0.25 mg (10/08/2018), 0.25 mg (11/05/2018), 0.25 mg (11/19/2018), 0.25 mg  (12/04/2018), 0.25 mg (12/18/2018), 0.25 mg (01/08/2019), 0.25 mg (01/22/2019), 0.25 mg (02/04/2019), 0.25 mg (02/18/2019), 0.25 mg (03/04/2019), 0.25 mg (03/18/2019), 0.25 mg (04/01/2019), 0.25 mg (04/14/2019) irinotecan (CAMPTOSAR) 420 mg in dextrose 5 % 500 mL chemo infusion, 180 mg/m2 = 420 mg, Intravenous,  Once, 9 of 9 cycles Administration: 420 mg (01/09/2018), 420 mg (01/23/2018), 420 mg (02/11/2018), 420 mg (02/25/2018), 420 mg (03/12/2018), 420 mg (03/27/2018), 420 mg (04/10/2018), 420 mg (04/24/2018), 420 mg (05/08/2018) leucovorin 900 mg in dextrose 5 % 250 mL infusion, 944 mg, Intravenous,  Once, 31 of 35 cycles Administration: 900 mg (01/09/2018), 900 mg (01/23/2018), 900 mg (02/11/2018), 900 mg (02/25/2018), 900 mg (03/12/2018), 900 mg (03/27/2018), 900 mg (04/10/2018), 900 mg (04/24/2018), 900 mg (05/08/2018), 900 mg (06/10/2018), 900 mg (06/26/2018), 900 mg (07/10/2018), 900 mg (07/24/2018), 900 mg (08/13/2018), 900 mg (08/27/2018), 900 mg (09/10/2018), 900 mg (09/24/2018), 900 mg (10/08/2018), 900 mg (11/05/2018), 900 mg (11/19/2018), 900 mg (12/04/2018), 900 mg (12/18/2018), 900 mg (01/08/2019), 900 mg (01/22/2019), 900 mg (02/04/2019), 900 mg (02/18/2019), 900 mg (03/04/2019), 900 mg (03/18/2019), 900 mg (04/01/2019), 900 mg (04/14/2019) fluorouracil (ADRUCIL) chemo injection 950 mg, 400 mg/m2 = 950 mg, Intravenous,  Once, 31 of 35 cycles Administration: 950 mg (01/09/2018), 950 mg (01/23/2018), 950 mg (02/11/2018), 950 mg (02/25/2018), 950 mg (03/12/2018), 950 mg (03/27/2018), 950 mg (04/10/2018), 950 mg (04/24/2018), 950 mg (05/08/2018), 950 mg (05/22/2018), 950 mg (06/10/2018), 950 mg (06/26/2018), 950 mg (07/10/2018), 950 mg (07/24/2018), 950 mg (08/13/2018), 950 mg (08/27/2018), 950 mg (09/10/2018), 950 mg (09/24/2018), 950 mg (10/08/2018), 950 mg (11/05/2018), 950 mg (11/19/2018), 950 mg (12/04/2018), 950 mg (12/18/2018), 950 mg (01/08/2019), 950 mg (01/22/2019), 950 mg (02/04/2019), 950 mg (02/18/2019), 950 mg (03/04/2019), 950 mg (03/18/2019), 950 mg (04/01/2019), 950  mg (04/14/2019) fluorouracil (ADRUCIL) 5,650 mg in sodium chloride 0.9 % 137 mL chemo infusion, 2,400 mg/m2 = 5,650 mg, Intravenous, 1 Day/Dose, 31 of 35 cycles Administration: 5,650 mg (01/09/2018), 5,650 mg (01/23/2018), 5,650 mg (02/11/2018), 5,650 mg (02/25/2018), 5,650 mg (03/12/2018), 5,650 mg (03/27/2018), 5,650 mg (04/10/2018), 5,650 mg (04/24/2018), 5,650 mg (05/08/2018), 5,650 mg (05/22/2018), 5,650 mg (06/10/2018), 5,650 mg (06/26/2018), 5,650 mg (07/10/2018), 5,650 mg (07/24/2018), 5,650 mg (08/13/2018), 5,650 mg (08/27/2018), 5,650 mg (09/10/2018), 5,650 mg (09/24/2018), 5,650 mg (10/08/2018), 5,650 mg (11/05/2018), 5,650 mg (11/19/2018), 5,650 mg (12/04/2018), 5,650 mg (12/18/2018), 5,650 mg (01/08/2019), 5,650 mg (01/22/2019), 5,650 mg (02/04/2019), 5,650 mg (02/18/2019), 5,650 mg (03/04/2019), 5,650 mg (03/18/2019), 5,650 mg (04/01/2019), 5,650 mg (04/14/2019) bevacizumab-awwb (MVASI) 500 mg in sodium chloride 0.9 % 100 mL chemo infusion, 4.7 mg/kg = 525 mg (100 % of original dose 5 mg/kg), Intravenous,  Once, 1 of 1 cycle Dose modification: 5 mg/kg (original dose 5 mg/kg, Cycle 24) Administration: 500 mg (01/08/2019) bevacizumab-bvzr (ZIRABEV) 500 mg in sodium chloride 0.9 % 100 mL chemo infusion, 5 mg/kg = 500 mg (100 % of original dose 5 mg/kg), Intravenous,  Once, 10 of 14 cycles Dose modification: 5 mg/kg (original dose 5 mg/kg, Cycle 21) Administration: 500 mg (11/19/2018), 500 mg (12/04/2018), 500 mg (12/18/2018), 500 mg (01/22/2019), 500 mg (02/04/2019), 500 mg (02/18/2019), 500 mg (03/04/2019), 500 mg (03/18/2019), 500 mg (04/01/2019), 500 mg (04/14/2019)  for chemotherapy treatment.    01/23/2018 Cancer Staging   Staging form: Colon and Rectum, AJCC 7th Edition - Pathologic: M1 - Signed by Zoila Shutter, MD on 01/23/2018   Malignant neoplasm of overlapping sites of colon ([place])  12/26/2017 Initial Diagnosis   Cancer of overlapping sites of colon metastatic to intra-abdominal lymph node ([place])   01/09/2018 -  Chemotherapy    The patient had palonosetron (ALOXI) injection 0.25 mg, 0.25 mg, Intravenous,  Once, 13 of 14 cycles Administration: 0.25 mg (01/09/2018), 0.25 mg (01/23/2018), 0.25 mg (02/11/2018), 0.25 mg (02/25/2018), 0.25 mg (03/12/2018), 0.25 mg (03/27/2018), 0.25 mg (04/10/2018), 0.25 mg (04/24/2018), 0.25 mg (05/08/2018), 0.25 mg (05/22/2018), 0.25 mg (06/10/2018), 0.25 mg (06/26/2018), 0.25 mg (07/10/2018) irinotecan (CAMPTOSAR) 420 mg in dextrose 5 % 500 mL chemo infusion, 180 mg/m2 = 420 mg, Intravenous,  Once, 9 of 9 cycles Administration: 420 mg (01/09/2018), 420 mg (01/23/2018), 420 mg (02/11/2018), 420 mg (02/25/2018), 420 mg (03/12/2018), 420 mg (03/27/2018), 420 mg (04/10/2018), 420 mg (04/24/2018), 420 mg (05/08/2018) leucovorin 900 mg in dextrose 5 % 250 mL infusion, 944 mg, Intravenous,  Once, 13 of 14 cycles Administration: 900 mg (01/09/2018), 900 mg (01/23/2018), 900 mg (02/11/2018), 900 mg (02/25/2018), 900 mg (03/12/2018), 900 mg (03/27/2018), 900 mg (04/10/2018), 900 mg (04/24/2018), 900 mg (05/08/2018), 900 mg (06/10/2018), 900 mg (06/26/2018), 900 mg (07/10/2018) fluorouracil (ADRUCIL) chemo injection 950 mg, 400 mg/m2 = 950 mg, Intravenous,  Once, 13 of 14 cycles Administration: 950 mg (01/09/2018), 950 mg (01/23/2018), 950 mg (02/11/2018), 950 mg (02/25/2018), 950 mg (03/12/2018), 950 mg (03/27/2018), 950 mg (04/10/2018), 950 mg (04/24/2018), 950 mg (05/08/2018), 950 mg (05/22/2018), 950 mg (06/10/2018), 950 mg (06/26/2018), 950 mg (07/10/2018) fluorouracil (ADRUCIL) 5,650 mg in sodium chloride 0.9 % 137 mL chemo infusion, 2,400 mg/m2 = 5,650 mg, Intravenous, 1 Day/Dose, 13 of 14 cycles Administration: 5,650 mg (01/09/2018), 5,650 mg (01/23/2018), 5,650 mg (02/11/2018), 5,650 mg (02/25/2018), 5,650 mg (03/12/2018), 5,650 mg (03/27/2018), 5,650 mg (04/10/2018), 5,650 mg (04/24/2018), 5,650 mg (05/08/2018), 5,650 mg (05/22/2018), 5,650 mg (06/10/2018), 5,650 mg (06/26/2018), 5,650 mg (07/10/2018)  for chemotherapy treatment.    Colon cancer metastasized to multiple sites  [facility])  01/16/2018 Initial Diagnosis   Colon cancer metastasized to multiple sites [facility])   01/23/2018 - 02/10/2018 Chemotherapy   The patient had panitumumab (VECTIBIX) 600 mg in sodium chloride 0.9 % 100 mL chemo infusion, 640 mg, Intravenous,  Once, 1 of 3 cycles Administration: 600 mg (01/23/2018)  for chemotherapy treatment.    03/12/2018 - 10/21/2018 Chemotherapy   The patient had bevacizumab (AVASTIN) 500 mg in sodium chloride 0.9 % 100 mL chemo infusion, 525 mg, Intravenous,  Once, 15 of 22 cycles Administration: 500 mg (03/12/2018), 500 mg (03/27/2018), 500 mg (04/10/2018), 500 mg (04/24/2018), 500 mg (06/10/2018), 500 mg (05/08/2018), 500 mg (05/22/2018), 500 mg (06/26/2018), 500 mg (07/10/2018), 500 mg (07/24/2018), 500 mg (08/13/2018), 500 mg (08/27/2018), 500 mg (09/10/2018), 500 mg (09/24/2018), 500 mg (10/08/2018)  for chemotherapy treatment.       CANCER STAGING: Cancer Staging Malignant neoplasm of transverse colon [facility]) Staging form: Colon and Rectum, AJCC 7th Edition - Pathologic stage from 03/04/2015: Stage IIIC (T4a, N2a, cM0) - Signed by Baird Cancer, PA-C on 03/04/2015 - Pathologic: M1 - Signed by Zoila Shutter, MD on 01/23/2018    INTERVAL HISTORY:  Erik [name] 64 y.o. male seen for follow-up of metastatic colon cancer, toxicity assessment prior to next cycle of chemotherapy.  Reported appetite and energy levels are 50%.  Pain in the right hand is stable.  He is taking of the cast for few hours every day.  Occasional blood-tinged mucus from the nose.  No frank nosebleeds.  Diarrhea is slightly better but he averages Lomotil 2 tablets daily.  Numbness  in the feet has been stable.  Shortness of breath on exertion is also stable.  REVIEW OF SYSTEMS:  Review of Systems  HENT:   Positive for nosebleeds.   Respiratory: Positive for shortness of breath.   Gastrointestinal: Positive for diarrhea.  Neurological: Positive for numbness.  All other systems reviewed and are negative.    PAST  MEDICAL/SURGICAL HISTORY:  Past Medical History:  Diagnosis Date  . Abnormal stress echocardiogram   . Adenocarcinoma of transverse colon ([place]) 02/10/2015  . Alcohol abuse    Heavy Use up until 2010  . Anxiety   . Blood transfusion without reported diagnosis   . Cancer of overlapping sites of colon metastatic to intra-abdominal lymph node ([place]) 12/26/2017  . COPD (chronic obstructive pulmonary disease) ([place])   . Depression   . Emphysema of lung ([place])   . GERD (gastroesophageal reflux disease)   . Head trauma 2001   closed head injury; coma for 4 weeks  . Hypercholesterolemia   . Hypertension   . ING HERN W/GANGREN RECUR UNILAT/UNSPEC ING HERN 04/21/2009   Qualifier: Diagnosis of  By: Verl Blalock, MD, Delanna Ahmadi PTSD (post-traumatic stress disorder)   . Pulmonary nodule 06/14/2016  . SAH (subarachnoid hemorrhage) ([place]) 08/31/2012  . SDH (subdural hematoma) ([place]) 08/31/2012   Past Surgical History:  Procedure Laterality Date  . BIOPSY  01/25/2016   Procedure: BIOPSY;  Surgeon: Danie Binder, MD;  [facility];  Service: Endoscopy;;  ileum;   . cardiac cath    . COLONOSCOPY  2011   Dr. Oneida Alar: multiple adenomas and hyperplastic polyps  . COLONOSCOPY WITH PROPOFOL N/A 01/12/2015   Procedure: COLONOSCOPY WITH PROPOFOL;  Surgeon: Danie Binder, MD;  [facility];  Service: Endoscopy;  Laterality: N/A;  1030  . COLONOSCOPY WITH PROPOFOL N/A 01/25/2016   Procedure: COLONOSCOPY WITH PROPOFOL;  Surgeon: Danie Binder, MD;  [facility];  Service: Endoscopy;  Laterality: N/A;  1230  . CRANIOTOMY  2001  . ESOPHAGOGASTRODUODENOSCOPY (EGD) WITH PROPOFOL N/A 01/12/2015   Procedure: ESOPHAGOGASTRODUODENOSCOPY (EGD) WITH PROPOFOL;  Surgeon: Danie Binder, MD;  [facility];  Service: Endoscopy;  Laterality: N/A;  . head injury surgery    . HERNIA REPAIR Right 2012   Inguinal- Forestine Na  . KIDNEY SURGERY     >30 years ago  . LAPAROSCOPIC RIGHT HEMI  COLECTOMY Left 02/10/2015   Procedure: LAPAROSCOPIC THEN OPEN RIGHT HEMI COLECTOMY;  Surgeon: [name], MD;  Location: [facility];  Service: General;  Laterality: Left;  . LAPAROTOMY N/A 12/19/2017   Procedure: EXPLORATORY LAPAROTOMY;  Surgeon: Aviva Signs, MD;  Location: [facility];  Service: General;  Laterality: N/A;  . OPEN REDUCTION INTERNAL FIXATION (ORIF) DISTAL RADIAL FRACTURE Right 10/18/2018   Procedure: OPEN REDUCTION INTERNAL FIXATION (ORIF) RIGHT DISTAL RADIAL FRACTURE;  Surgeon: [name], MD;  Location: [place];  Service: Orthopedics;  Laterality: Right;  block in preop  . OPEN REDUCTION INTERNAL FIXATION (ORIF) DISTAL RADIAL FRACTURE Right 12/20/2018   Procedure: OPEN REDUCTION INTERNAL FIXATION (ORIF) DISTAL RADIUS AND ULNA FRACTURE;  Surgeon: [name], MD;  Location: [place];  Service: Orthopedics;  Laterality: Right;  block  . POLYPECTOMY  01/25/2016   Procedure: POLYPECTOMY;  Surgeon: Danie Binder, MD;  [facility];  Service: Endoscopy;;  colon  . PORT-A-CATH REMOVAL N/A 02/26/2017   Procedure: MINOR REMOVAL PORT-A-CATH;  Surgeon: Aviva Signs, MD;  Location: [facility];  Service: General;  Laterality: N/A;  Pt to arrive at 8:00am  . PORTACATH PLACEMENT N/A 03/24/2015   Procedure: INSERTION PORT-A-CATH;  Surgeon: Jules Husbands, MD;  Location: [facility];  Service: General;  Laterality: N/A;  . PORTACATH PLACEMENT Left 01/07/2018   Procedure: INSERTION PORT A CATH (ATTACHED CATHETER IN LEFT SUBCLAVIAN);  Surgeon: Aviva Signs, MD;  Location: [facility];  Service: General;  Laterality: Left;     SOCIAL HISTORY:  Social History   Socioeconomic History  . Marital status: Divorced    Spouse name: Not on file  . Number of children: 2  . Years of education: 26  . Highest education level: Not on file  Occupational History  . Occupation: disabled/unemployed  . Occupation: unemployed/Mediicaid only    Comment: NO INCOME  Tobacco  Use  . Smoking status: Former Smoker    Packs/day: 0.10    Years: 30.00    Pack years: 3.00    Types: Cigarettes    Start date: 02/06/1973    Quit date: 12/09/2017    Years since quitting: 1.3  . Smokeless tobacco: Never Used  . Tobacco comment: Quit on 12/09/2017  Substance and Sexual Activity  . Alcohol use: Yes    Alcohol/week: 0.0 standard drinks    Comment: beer occ, history of ETOH abuse in remote past.   . Drug use: No  . Sexual activity: Never    Partners: Female    Birth control/protection: None  Other Topics Concern  . Not on file  Social History Narrative   Single   Lives alone   Turned down for disability   Social Determinants of Health   Financial Resource Strain:   . Difficulty of Paying Living Expenses: Not on file  Food Insecurity:   . Worried About Charity fundraiser in the Last Year: Not on file  . Ran Out of Food in the Last Year: Not on file  Transportation Needs:   . Lack of Transportation (Medical): Not on file  . Lack of Transportation (Non-Medical): Not on file  Physical Activity:   . Days of Exercise per Week: Not on file  . Minutes of Exercise per Session: Not on file  Stress:   . Feeling of Stress : Not on file  Social Connections:   . Frequency of Communication with Friends and Family: Not on file  . Frequency of Social Gatherings with Friends and Family: Not on file  . Attends Religious Services: Not on file  . Active Member of Clubs or Organizations: Not on file  . Attends Archivist Meetings: Not on file  . Marital Status: Not on file  Intimate Partner Violence:   . Fear of Current or Ex-Partner: Not on file  . Emotionally Abused: Not on file  . Physically Abused: Not on file  . Sexually Abused: Not on file    FAMILY HISTORY:  Family History  Problem Relation Age of Onset  . Pulmonary embolism Mother   . Breast cancer Mother   . Cancer Mother        breast cancer  . Arthritis Mother   . Heart disease Father   .  Cancer Father 39       Leukemia  . Cancer Paternal Grandfather        Lung  . Ataxia Neg Hx   . Chorea Neg Hx   . Dementia Neg Hx   . Mental retardation Neg Hx   . Migraines Neg Hx   . Multiple sclerosis  Neg Hx   . Neurofibromatosis Neg Hx   . Neuropathy Neg Hx   . Parkinsonism Neg Hx   . Seizures Neg Hx   . Stroke Neg Hx   . Colon cancer Neg Hx     CURRENT MEDICATIONS:  Outpatient Encounter Medications as of 04/14/2019  Medication Sig Note  . amitriptyline (ELAVIL) 25 MG tablet Take 1 tablet (25 mg total) by mouth at bedtime.   Marland Kitchen atorvastatin (LIPITOR) 20 MG tablet TAKE 1 TABLET BY MOUTH AT BEDTIME (Patient taking differently: Take 20 mg by mouth daily. )   . buPROPion (WELLBUTRIN SR) 150 MG 12 hr tablet Take 150 mg by mouth every morning.    . busPIRone (BUSPAR) 10 MG tablet Take 10 mg by mouth 2 (two) times daily.   . fluorouracil CALGB 03833 in sodium chloride 0.9 % 150 mL Inject 5,650 mg into the vein. Over 46 hours   . FLUoxetine (PROZAC) 20 MG capsule TAKE 1 CAPSULE BY MOUTH 2 TIMES A DAY (Patient taking differently: Take 20 mg by mouth 2 (two) times daily. ) 06/10/2018: 1 tab TID  . gabapentin (NEURONTIN) 300 MG capsule TAKE 1 CAPSULE BY MOUTH THREE TIMES A DAY   . IRINOTECAN HCL IV Inject 420 mg into the vein every 14 (fourteen) days.   Marland Kitchen LEUCOVORIN CALCIUM IV Inject 944 mg into the vein every 14 (fourteen) days.   Marland Kitchen lidocaine-prilocaine (EMLA) cream APPLY A SMALL AMOUNT OVER PORT SITE AND COVER WITH PLASTIC WRAP ONE HOUR PRIOR TO APPOINTMENT   . loperamide (IMODIUM) 2 MG capsule TAKE 2 CAPSULES BY MOUTH TWICE DAILY   . nystatin (MYCOSTATIN/NYSTOP) powder Apply 1 application topically 4 (four) times daily.   Marland Kitchen omeprazole (PRILOSEC) 20 MG capsule Take 20 mg by mouth every morning.    . ondansetron (ZOFRAN) 8 MG tablet Take 1 tablet (8 mg total) by mouth 2 (two) times daily as needed for refractory nausea / vomiting. Start on day 3 after chemotherapy.   Marland Kitchen PROAIR HFA 108 (90  Base) MCG/ACT inhaler INHALE 2 PUFFS BY MOUTH EVERY 6 HOURS AS NEEDED FOR SHORTNESS OF BREATH/WHEEZING.   Marland Kitchen prochlorperazine (COMPAZINE) 10 MG tablet Take 1 tablet (10 mg total) by mouth every 6 (six) hours as needed (NAUSEA).   . rivaroxaban (XARELTO) 20 MG TABS tablet Take 1 tablet (20 mg total) by mouth daily with supper.   . Vitamin D, Ergocalciferol, (DRISDOL) 1.25 MG (50000 UT) CAPS capsule Take 50,000 Units by mouth once a week.    Facility-Administered Encounter Medications as of 04/14/2019  Medication  . sodium chloride flush (NS) 0.9 % injection 10 mL    ALLERGIES:  No Known Allergies   PHYSICAL EXAM:  ECOG Performance status: 1  Vitals:   04/14/19 0801  BP: 140/77  Pulse: (!) 112  Resp: 18  Temp: (!) 96.6 F (35.9 C)  SpO2: 98%   Filed Weights   04/14/19 0801  Weight: 217 lb 9.6 oz (98.7 kg)    Physical Exam Vitals reviewed.  Constitutional:      Appearance: Normal appearance.  Cardiovascular:     Rate and Rhythm: Normal rate and regular rhythm.     Heart sounds: Normal heart sounds.  Pulmonary:     Effort: Pulmonary effort is normal.     Breath sounds: Normal breath sounds.  Abdominal:     General: There is no distension.     Palpations: Abdomen is soft. There is no mass.  Musculoskeletal:  General: No swelling.  Skin:    General: Skin is warm.  Neurological:     General: No focal deficit present.     Mental Status: He is alert and oriented to person, place, and time.  Psychiatric:        Mood and Affect: Mood normal.        Behavior: Behavior normal.      LABORATORY DATA:  I have reviewed the labs as listed.  CBC    Component Value Date/Time   WBC 5.8 04/14/2019 0758   RBC 4.22 04/14/2019 0758   HGB 13.7 04/14/2019 0758   HCT 42.9 04/14/2019 0758   PLT 217 04/14/2019 0758   MCV 101.7 (H) 04/14/2019 0758   MCH 32.5 04/14/2019 0758   MCHC 31.9 04/14/2019 0758   RDW 17.2 (H) 04/14/2019 0758   LYMPHSABS 1.0 04/14/2019 0758    MONOABS 0.6 04/14/2019 0758   EOSABS 0.1 04/14/2019 0758   BASOSABS 0.1 04/14/2019 0758   CMP Latest Ref Rng & Units 04/14/2019 04/01/2019 03/26/2019  Glucose 70 - 99 mg/dL 113(H) 133(H) 113(H)  BUN 8 - 23 mg/dL '9 10 14  ' Creatinine 0.61 - 1.24 mg/dL 0.90 0.92 0.96  Sodium 135 - 145 mmol/L 138 136 130(L)  Potassium 3.5 - 5.1 mmol/L 4.5 4.2 3.5  Chloride 98 - 111 mmol/L 99 96(L) 92(L)  CO2 22 - 32 mmol/L '30 29 25  ' Calcium 8.9 - 10.3 mg/dL 9.2 9.2 8.8(L)  Total Protein 6.5 - 8.1 g/dL 6.9 7.1 6.9  Total Bilirubin 0.3 - 1.2 mg/dL 0.5 0.5 1.4(H)  Alkaline Phos 38 - 126 U/L 94 102 87  AST 15 - 41 U/L '15 21 16  ' ALT 0 - 44 U/L '12 17 12       ' DIAGNOSTIC IMAGING:  I have independently reviewed the scans.   I have reviewed Venita Lick LPN's note and agree with the documentation.  I personally performed a face-to-face visit, made revisions and my assessment and plan is as follows.    ASSESSMENT & PLAN:   Malignant neoplasm of transverse colon ([place]) 1.  Metastatic colon cancer, K-ras mutation positive, MSI stable: -Maintenance 5-FU/leucovorin with bevacizumab started on 05/22/2018. -CT CAP independently reviewed by me on 03/01/2019 showed peritoneal/omental disease which is stable. -He was reportedly treated for UTI with Keflex 3 times a day for 7 days.  He does not have any dysuria at this time.  However his urination is still not to the baseline. -We reviewed his CBC which showed white count 5.8 with normal platelet count. -Last CEA on 04/01/2019 was elevated at 12.  This has gone up from 10.3. -He will proceed with his treatment today without any major dose changes. -We will reevaluate him in 2 weeks for follow-up.  2.  Pulmonary embolism: -Incidental PE on chest CT on 05/20/2018. -He will continue Xarelto.  He does not have any bleeding issues.  3.  Peripheral neuropathy: -Numbness in the feet has been stable from prior oxaliplatin therapy.  4.  Diarrhea: -He will continue Lomotil  averaging 2 tablets/day.  Diarrhea is well controlled.  5.  Multiple falls: -He has history of multiple falls since he had a head injury many years ago.  He does not usually have any warning signs prior to fall. -Latest MRI of the brain did not show any abnormality. -He had fracture of the right wrist and medial clavicle, follows up with Dr. Fredna Dow.     Orders placed this encounter:  No orders of the  defined types were placed in this encounter.     Derek Jack, MD [place] [phone]

## 2019-04-14 NOTE — Progress Notes (Signed)
0859 Labs reviewed with and pt seen by Dr. Delton Coombes and pt approved for Avastin,Leucovorin and 5FU infusions today per MD                                                                                                    Erik [name] tolerated chemo tx well without complaints or incident. Pt discharged with 5FU pump infusing without issues. VSS upon discharge. Pt discharged vai wheelchair in satisfactory condition

## 2019-04-14 NOTE — Patient Instructions (Signed)
[facility] at [facility] Discharge Instructions  Labs drawn from portacath today   Thank you for choosing [facility] at [facility] to provide your oncology and hematology care.  To afford each patient quality time with our provider, please arrive at least 15 minutes before your scheduled appointment time.   If you have a lab appointment with the Cancer Center please come in thru the Main Entrance and check in at the main information desk.  You need to re-schedule your appointment should you arrive 10 or more minutes late.  We strive to give you quality time with our providers, and arriving late affects you and other patients whose appointments are after yours.  Also, if you no show three or more times for appointments you may be dismissed from the clinic at the providers discretion.     Again, thank you for choosing [facility].  Our hope is that these requests will decrease the amount of time that you wait before being seen by our physicians.       _____________________________________________________________  Should you have questions after your visit to [facility], please contact our office at [phone] between the hours of 8:00 a.m. and 4:30 p.m.  Voicemails left after 4:00 p.m. will not be returned until the following business day.  For prescription refill requests, have your pharmacy contact our office and allow 72 hours.    Due to Covid, you will need to wear a mask upon entering the hospital. If you do not have a mask, a mask will be given to you at the Main Entrance upon arrival. For doctor visits, patients may have 1 support person with them. For treatment visits, patients can not have anyone with them due to social distancing guidelines and our immunocompromised population.     

## 2019-04-14 NOTE — Progress Notes (Signed)
Patient has been assessed, vital signs and labs have been reviewed by Dr. Katragadda. ANC, Creatinine, LFTs, and Platelets are within treatment parameters per Dr. Katragadda. The patient is good to proceed with treatment at this time.  

## 2019-04-14 NOTE — Patient Instructions (Signed)
[facility] at [facility] Discharge Instructions  You were seen today by Dr. Katragadda. He went over your recent lab results. He will see you back in 2 weeks for labs, treatment and follow up.   Thank you for choosing [facility] at [facility] to provide your oncology and hematology care.  To afford each patient quality time with our provider, please arrive at least 15 minutes before your scheduled appointment time.   If you have a lab appointment with the Cancer Center please come in thru the  Main Entrance and check in at the main information desk  You need to re-schedule your appointment should you arrive 10 or more minutes late.  We strive to give you quality time with our providers, and arriving late affects you and other patients whose appointments are after yours.  Also, if you no show three or more times for appointments you may be dismissed from the clinic at the providers discretion.     Again, thank you for choosing [facility].  Our hope is that these requests will decrease the amount of time that you wait before being seen by our physicians.       _____________________________________________________________  Should you have questions after your visit to  Cancer Center, please contact our office at [phone] between the hours of 8:00 a.m. and 4:30 p.m.  Voicemails left after 4:00 p.m. will not be returned until the following business day.  For prescription refill requests, have your pharmacy contact our office and allow 72 hours.    Cancer Center Support Programs:   > Cancer Support Group  2nd Tuesday of the month 1pm-2pm, Journey Room    

## 2019-04-14 NOTE — Assessment & Plan Note (Addendum)
1.  Metastatic colon cancer, K-ras mutation positive, MSI stable: -Maintenance 5-FU/leucovorin with bevacizumab started on 05/22/2018. -CT CAP independently reviewed by me on 03/01/2019 showed peritoneal/omental disease which is stable. -He was reportedly treated for UTI with Keflex 3 times a day for 7 days.  He does not have any dysuria at this time.  However his urination is still not to the baseline. -We reviewed his CBC which showed white count 5.8 with normal platelet count. -Last CEA on 04/01/2019 was elevated at 12.  This has gone up from 10.3. -He will proceed with his treatment today without any major dose changes. -We will reevaluate him in 2 weeks for follow-up.  2.  Pulmonary embolism: -Incidental PE on chest CT on 05/20/2018. -He will continue Xarelto.  He does not have any bleeding issues.  3.  Peripheral neuropathy: -Numbness in the feet has been stable from prior oxaliplatin therapy.  4.  Diarrhea: -He will continue Lomotil averaging 2 tablets/day.  Diarrhea is well controlled.  5.  Multiple falls: -He has history of multiple falls since he had a head injury many years ago.  He does not usually have any warning signs prior to fall. -Latest MRI of the brain did not show any abnormality. -He had fracture of the right wrist and medial clavicle, follows up with Dr. Fredna Dow.

## 2019-04-15 LAB — CEA: CEA: 10.7 ng/mL — ABNORMAL HIGH (ref 0.0–4.7)

## 2019-04-16 ENCOUNTER — Inpatient Hospital Stay (HOSPITAL_COMMUNITY): Payer: Medicaid Other

## 2019-04-16 ENCOUNTER — Encounter (HOSPITAL_COMMUNITY): Payer: Self-pay

## 2019-04-16 ENCOUNTER — Other Ambulatory Visit: Payer: Self-pay

## 2019-04-16 VITALS — BP 122/71 | HR 90 | Temp 97.8°F | Resp 18

## 2019-04-16 DIAGNOSIS — C184 Malignant neoplasm of transverse colon: Secondary | ICD-10-CM

## 2019-04-16 DIAGNOSIS — C188 Malignant neoplasm of overlapping sites of colon: Secondary | ICD-10-CM

## 2019-04-16 DIAGNOSIS — Z5111 Encounter for antineoplastic chemotherapy: Secondary | ICD-10-CM | POA: Diagnosis not present

## 2019-04-16 MED ORDER — SODIUM CHLORIDE 0.9% FLUSH
10.0000 mL | INTRAVENOUS | Status: DC | PRN
  Administered 2019-04-16: 12:00:00 10 mL

## 2019-04-16 MED ORDER — HEPARIN SOD (PORK) LOCK FLUSH 100 UNIT/ML IV SOLN
500.0000 [IU] | Freq: Once | INTRAVENOUS | Status: AC | PRN
  Administered 2019-04-16: 500 [IU]

## 2019-04-16 NOTE — Patient Instructions (Signed)
[facility] at [facility] Discharge Instructions  5FU pump discontinued with portacath flushed per protocol. Follow-up as scheduled. Call clinic for any questions or concerns   Thank you for choosing [facility] at [facility] to provide your oncology and hematology care.  To afford each patient quality time with our provider, please arrive at least 15 minutes before your scheduled appointment time.   If you have a lab appointment with the Cancer Center please come in thru the Main Entrance and check in at the main information desk.  You need to re-schedule your appointment should you arrive 10 or more minutes late.  We strive to give you quality time with our providers, and arriving late affects you and other patients whose appointments are after yours.  Also, if you no show three or more times for appointments you may be dismissed from the clinic at the providers discretion.     Again, thank you for choosing [facility].  Our hope is that these requests will decrease the amount of time that you wait before being seen by our physicians.       _____________________________________________________________  Should you have questions after your visit to  Cancer Center, please contact our office at [phone] between the hours of 8:00 a.m. and 4:30 p.m.  Voicemails left after 4:00 p.m. will not be returned until the following business day.  For prescription refill requests, have your pharmacy contact our office and allow 72 hours.    Due to Covid, you will need to wear a mask upon entering the hospital. If you do not have a mask, a mask will be given to you at the Main Entrance upon arrival. For doctor visits, patients may have 1 support person with them. For treatment visits, patients can not have anyone with them due to social distancing guidelines and our immunocompromised population.     

## 2019-04-16 NOTE — Progress Notes (Signed)
Erik [name] tolerated 5FU pump well without complaints or incident. 5FU pump discontinued with portacath flushed easily per protocol then de-accessed. VSS Pt discharged self ambulatory using his cane in satisfactory condition

## 2019-04-24 NOTE — Progress Notes (Signed)

## 2019-04-28 ENCOUNTER — Inpatient Hospital Stay (HOSPITAL_BASED_OUTPATIENT_CLINIC_OR_DEPARTMENT_OTHER): Payer: Medicaid Other | Admitting: Hematology

## 2019-04-28 ENCOUNTER — Inpatient Hospital Stay (HOSPITAL_COMMUNITY): Payer: Medicaid Other

## 2019-04-28 ENCOUNTER — Encounter (HOSPITAL_COMMUNITY): Payer: Self-pay | Admitting: Hematology

## 2019-04-28 ENCOUNTER — Other Ambulatory Visit: Payer: Self-pay

## 2019-04-28 VITALS — BP 132/77 | HR 92 | Temp 96.9°F | Resp 18

## 2019-04-28 VITALS — BP 150/82 | HR 101 | Temp 96.0°F | Resp 18 | Wt 214.4 lb

## 2019-04-28 DIAGNOSIS — C184 Malignant neoplasm of transverse colon: Secondary | ICD-10-CM

## 2019-04-28 DIAGNOSIS — E538 Deficiency of other specified B group vitamins: Secondary | ICD-10-CM

## 2019-04-28 DIAGNOSIS — Z5111 Encounter for antineoplastic chemotherapy: Secondary | ICD-10-CM | POA: Diagnosis not present

## 2019-04-28 DIAGNOSIS — C189 Malignant neoplasm of colon, unspecified: Secondary | ICD-10-CM

## 2019-04-28 DIAGNOSIS — C188 Malignant neoplasm of overlapping sites of colon: Secondary | ICD-10-CM

## 2019-04-28 LAB — CBC WITH DIFFERENTIAL/PLATELET
Abs Immature Granulocytes: 0.03 10*3/uL (ref 0.00–0.07)
Basophils Absolute: 0.1 10*3/uL (ref 0.0–0.1)
Basophils Relative: 1 %
Eosinophils Absolute: 0.1 10*3/uL (ref 0.0–0.5)
Eosinophils Relative: 2 %
HCT: 44.7 % (ref 39.0–52.0)
Hemoglobin: 14.4 g/dL (ref 13.0–17.0)
Immature Granulocytes: 0 %
Lymphocytes Relative: 14 %
Lymphs Abs: 1.2 10*3/uL (ref 0.7–4.0)
MCH: 32.4 pg (ref 26.0–34.0)
MCHC: 32.2 g/dL (ref 30.0–36.0)
MCV: 100.4 fL — ABNORMAL HIGH (ref 80.0–100.0)
Monocytes Absolute: 1 10*3/uL (ref 0.1–1.0)
Monocytes Relative: 12 %
Neutro Abs: 5.9 10*3/uL (ref 1.7–7.7)
Neutrophils Relative %: 71 %
Platelets: 244 10*3/uL (ref 150–400)
RBC: 4.45 MIL/uL (ref 4.22–5.81)
RDW: 16.7 % — ABNORMAL HIGH (ref 11.5–15.5)
WBC: 8.4 10*3/uL (ref 4.0–10.5)
nRBC: 0 % (ref 0.0–0.2)

## 2019-04-28 LAB — COMPREHENSIVE METABOLIC PANEL
ALT: 14 U/L (ref 0–44)
AST: 16 U/L (ref 15–41)
Albumin: 3.7 g/dL (ref 3.5–5.0)
Alkaline Phosphatase: 123 U/L (ref 38–126)
Anion gap: 11 (ref 5–15)
BUN: 9 mg/dL (ref 8–23)
CO2: 27 mmol/L (ref 22–32)
Calcium: 9.2 mg/dL (ref 8.9–10.3)
Chloride: 99 mmol/L (ref 98–111)
Creatinine, Ser: 0.94 mg/dL (ref 0.61–1.24)
GFR calc Af Amer: >60 mL/min (ref >60)
GFR calc non Af Amer: >60 mL/min (ref >60)
Glucose, Bld: 106 mg/dL — ABNORMAL HIGH (ref 70–99)
Potassium: 4.5 mmol/L (ref 3.5–5.1)
Sodium: 137 mmol/L (ref 135–145)
Total Bilirubin: 0.7 mg/dL (ref 0.3–1.2)
Total Protein: 7.4 g/dL (ref 6.5–8.1)

## 2019-04-28 LAB — URINALYSIS, DIPSTICK ONLY
Bilirubin Urine: NEGATIVE
Glucose, UA: NEGATIVE mg/dL
Ketones, ur: NEGATIVE mg/dL
Nitrite: NEGATIVE
Protein, ur: 30 mg/dL — AB
Specific Gravity, Urine: 1.013 (ref 1.005–1.030)
pH: 5 (ref 5.0–8.0)

## 2019-04-28 LAB — MAGNESIUM: Magnesium: 2 mg/dL (ref 1.7–2.4)

## 2019-04-28 MED ORDER — SODIUM CHLORIDE 0.9 % IV SOLN
5.0000 mg/kg | Freq: Once | INTRAVENOUS | Status: AC
  Administered 2019-04-28: 11:00:00 500 mg via INTRAVENOUS
  Filled 2019-04-28: qty 4

## 2019-04-28 MED ORDER — HEPARIN SOD (PORK) LOCK FLUSH 100 UNIT/ML IV SOLN: 500.0000 [IU] | Freq: Once | INTRAVENOUS | Status: DC | PRN

## 2019-04-28 MED ORDER — SODIUM CHLORIDE 0.9 % IV SOLN
2400.0000 mg/m2 | INTRAVENOUS | Status: DC
  Administered 2019-04-28: 5650 mg via INTRAVENOUS
  Filled 2019-04-28: qty 113

## 2019-04-28 MED ORDER — PALONOSETRON HCL INJECTION 0.25 MG/5ML
0.2500 mg | Freq: Once | INTRAVENOUS | Status: AC
  Administered 2019-04-28: 10:00:00 0.25 mg via INTRAVENOUS
  Filled 2019-04-28: qty 5

## 2019-04-28 MED ORDER — SODIUM CHLORIDE 0.9% FLUSH
10.0000 mL | INTRAVENOUS | Status: DC | PRN
  Administered 2019-04-28: 09:00:00 10 mL

## 2019-04-28 MED ORDER — SODIUM CHLORIDE 0.9 % IV SOLN: Freq: Once | INTRAVENOUS | Status: AC

## 2019-04-28 MED ORDER — LEUCOVORIN CALCIUM INJECTION 350 MG
900.0000 mg | Freq: Once | INTRAVENOUS | Status: AC
  Administered 2019-04-28: 900 mg via INTRAVENOUS
  Filled 2019-04-28: qty 10

## 2019-04-28 MED ORDER — SODIUM CHLORIDE 0.9 % IV SOLN
10.0000 mg | Freq: Once | INTRAVENOUS | Status: AC
  Administered 2019-04-28: 10:00:00 10 mg via INTRAVENOUS
  Filled 2019-04-28: qty 10

## 2019-04-28 MED ORDER — FLUOROURACIL CHEMO INJECTION 2.5 GM/50ML
400.0000 mg/m2 | Freq: Once | INTRAVENOUS | Status: AC
  Administered 2019-04-28: 950 mg via INTRAVENOUS
  Filled 2019-04-28: qty 19

## 2019-04-28 MED ORDER — CYANOCOBALAMIN 1000 MCG/ML IJ SOLN: 1000.0000 ug | Freq: Once | INTRAMUSCULAR | Status: DC

## 2019-04-28 NOTE — Patient Instructions (Signed)
[facility] at [facility] Discharge Instructions  You were seen today by Dr. Katragadda. He went over your recent lab results. He will see you back in 2 weeks for labs, treatment and follow up.   Thank you for choosing [facility] at [facility] to provide your oncology and hematology care.  To afford each patient quality time with our provider, please arrive at least 15 minutes before your scheduled appointment time.   If you have a lab appointment with the Cancer Center please come in thru the  Main Entrance and check in at the main information desk  You need to re-schedule your appointment should you arrive 10 or more minutes late.  We strive to give you quality time with our providers, and arriving late affects you and other patients whose appointments are after yours.  Also, if you no show three or more times for appointments you may be dismissed from the clinic at the providers discretion.     Again, thank you for choosing [facility].  Our hope is that these requests will decrease the amount of time that you wait before being seen by our physicians.       _____________________________________________________________  Should you have questions after your visit to [facility], please contact our office at [phone] between the hours of 8:00 a.m. and 4:30 p.m.  Voicemails left after 4:00 p.m. will not be returned until the following business day.  For prescription refill requests, have your pharmacy contact our office and allow 72 hours.    Cancer Center Support Programs:   > Cancer Support Group  2nd Tuesday of the month 1pm-2pm, Journey Room    

## 2019-04-28 NOTE — Progress Notes (Signed)
75- Lab results reviewed and patient seen by Dr. Delton Coombes who approved patient for treatment today. Pt denies any complaints or new changes since last treatment.  Erik [name] tolerated Avastin, Leucovorin, and 5FU without incident or complaint. VSS. Discharged in satisfactory condition with 5FU infusing through home infusion pump.

## 2019-04-28 NOTE — Progress Notes (Signed)
Patient has been assessed, vital signs and labs have been reviewed by Dr. Katragadda. ANC, Creatinine, LFTs, and Platelets are within treatment parameters per Dr. Katragadda. The patient is good to proceed with treatment at this time.  

## 2019-04-28 NOTE — Progress Notes (Signed)
[address], [place] [address]   CLINIC:  Medical Oncology/Hematology  PCP:  Alanson Puls, [facility] [place] [address] [phone]   REASON FOR VISIT:  Follow-up for metastatic colon cancer   BRIEF ONCOLOGIC HISTORY:  Oncology History Overview Note  Stage IIIC [name] adenocarcinoma of transverse colon, diagnosed on colonoscopy by Dr. Oneida Alar on 01/12/2015 followed by definitive surgery by Dr. [name] with right hemicolectomy on 02/12/2015.  He then underwent FOLFOX x 12 cycles in the adjuvant setting (03/30/2015- 09/06/2015).   Malignant neoplasm of transverse colon ([place])  01/12/2015 Pathologic Stage   Colon, biopsy, distal transverse - TUBULOVILLOUS ADENOMA WITH HIGH GRADE DYSPLASIA.   01/12/2015 Procedure   Colonoscopy by Dr. Oneida Alar.   01/12/2015 Tumor Marker   CEA: 6.6 (H)    01/18/2015 Imaging   CT abd/pelvis- Apple-core lesion identified in the mid transverse colon without obstruction. No evidence for lymphadenopathy in the gastrohepatic ligament or omentum.  Stable 8 mm hypo attenuating lesion in the left liver, likely a cyst.   02/10/2015 Initial Diagnosis   Adenocarcinoma of transverse colon ([place])   02/12/2015 Definitive Surgery   [name], Extended right hemicolectomy    02/12/2015 Pathology Results   Mucinous adenocarcinoma with penetration of visceral peritoneum, 4/19 lymph nodes for metastatic disease, negative resection margins, with LVI and perineural invasion   03/30/2015 - 09/06/2015 Chemotherapy   FOLFOX x 12 cycles   05/25/2015 Treatment Plan Change   5 FU bolus discontinued for cycle #5   06/08/2015 Treatment Plan Change   Treatment deferred x 1 week   06/15/2015 Treatment Plan Change   5FU CI decreased by 10% and Oxaliplatin reduced by 15% for cycles #6-#11; Oxaliplatin dropped for cycle #12 d/t neuropathy.    10/20/2015 Imaging   CT CAP- Right hemicolectomy without evidence of  metastatic disease. 2. Previously measured ground-glass lesion in the left upper lobe has resolved. 3. Probable food debris in the stomach, simulating gastric wall thickening. Please correlate clinically. 4. 6 mm irregular nodular density in the left upper lobe, stable. Continued attention on followup exams is warranted.   01/25/2016 Procedure   Colonoscopy by Dr. Oneida Alar- Non-thrombosed external hemorrhoids found on digital rectal exam. - One 4 mm polyp in the rectum, removed with a cold biopsy forceps. Resected and retrieved. - Congested mucosa in the neo-terminal ileum. Biopsied. - Redundant colon. - Internal hemorrhoids.   01/26/2016 Pathology Results   1. Terminal ileum, biopsy - MILD ACUTE (ACTIVE) ILEITIS. - NO DYSPLASIA OR MALIGNANCY IDENTIFIED - SEE COMMENT. 2. Rectum, polyp(s) - HYPERPLASTIC POLYP (X 1). - NO DYSPLASIA OR MALIGNANCY IDENTIFIED.   04/13/2016 Imaging   CT chest- Stable CT chest. 6 mm irregular nodule anterior left upper lobe is stable. The scattered areas of peribronchovascular micro nodularity in the lungs bilaterally are unchanged.   10/19/2016 Imaging   CT CAP: 1. Status post right hemicolectomy. No findings to suggest metastatic disease in the abdomen or pelvis. 2. Aortic atherosclerosis. 3. Additional incidental findings, as above. Aortic Atherosclerosis (ICD10-I70.0).   01/09/2018 -  Chemotherapy   The patient had palonosetron (ALOXI) injection 0.25 mg, 0.25 mg, Intravenous,  Once, 32 of 35 cycles Administration: 0.25 mg (01/09/2018), 0.25 mg (01/23/2018), 0.25 mg (02/11/2018), 0.25 mg (02/25/2018), 0.25 mg (03/12/2018), 0.25 mg (03/27/2018), 0.25 mg (04/10/2018), 0.25 mg (04/24/2018), 0.25 mg (05/08/2018), 0.25 mg (05/22/2018), 0.25 mg (06/10/2018), 0.25 mg (06/26/2018), 0.25 mg (07/10/2018), 0.25 mg (07/24/2018), 0.25 mg (08/13/2018), 0.25 mg (08/27/2018),  0.25 mg (09/10/2018), 0.25 mg (09/24/2018), 0.25 mg (10/08/2018), 0.25 mg (11/05/2018), 0.25 mg (11/19/2018), 0.25 mg  (12/04/2018), 0.25 mg (12/18/2018), 0.25 mg (01/08/2019), 0.25 mg (01/22/2019), 0.25 mg (02/04/2019), 0.25 mg (02/18/2019), 0.25 mg (03/04/2019), 0.25 mg (03/18/2019), 0.25 mg (04/01/2019), 0.25 mg (04/14/2019), 0.25 mg (04/28/2019) irinotecan (CAMPTOSAR) 420 mg in dextrose 5 % 500 mL chemo infusion, 180 mg/m2 = 420 mg, Intravenous,  Once, 9 of 9 cycles Administration: 420 mg (01/09/2018), 420 mg (01/23/2018), 420 mg (02/11/2018), 420 mg (02/25/2018), 420 mg (03/12/2018), 420 mg (03/27/2018), 420 mg (04/10/2018), 420 mg (04/24/2018), 420 mg (05/08/2018) leucovorin 900 mg in dextrose 5 % 250 mL infusion, 944 mg, Intravenous,  Once, 32 of 35 cycles Administration: 900 mg (01/09/2018), 900 mg (01/23/2018), 900 mg (02/11/2018), 900 mg (02/25/2018), 900 mg (03/12/2018), 900 mg (03/27/2018), 900 mg (04/10/2018), 900 mg (04/24/2018), 900 mg (05/08/2018), 900 mg (06/10/2018), 900 mg (06/26/2018), 900 mg (07/10/2018), 900 mg (07/24/2018), 900 mg (08/13/2018), 900 mg (08/27/2018), 900 mg (09/10/2018), 900 mg (09/24/2018), 900 mg (10/08/2018), 900 mg (11/05/2018), 900 mg (11/19/2018), 900 mg (12/04/2018), 900 mg (12/18/2018), 900 mg (01/08/2019), 900 mg (01/22/2019), 900 mg (02/04/2019), 900 mg (02/18/2019), 900 mg (03/04/2019), 900 mg (03/18/2019), 900 mg (04/01/2019), 900 mg (04/14/2019), 900 mg (04/28/2019) fluorouracil (ADRUCIL) chemo injection 950 mg, 400 mg/m2 = 950 mg, Intravenous,  Once, 32 of 35 cycles Administration: 950 mg (01/09/2018), 950 mg (01/23/2018), 950 mg (02/11/2018), 950 mg (02/25/2018), 950 mg (03/12/2018), 950 mg (03/27/2018), 950 mg (04/10/2018), 950 mg (04/24/2018), 950 mg (05/08/2018), 950 mg (05/22/2018), 950 mg (06/10/2018), 950 mg (06/26/2018), 950 mg (07/10/2018), 950 mg (07/24/2018), 950 mg (08/13/2018), 950 mg (08/27/2018), 950 mg (09/10/2018), 950 mg (09/24/2018), 950 mg (10/08/2018), 950 mg (11/05/2018), 950 mg (11/19/2018), 950 mg (12/04/2018), 950 mg (12/18/2018), 950 mg (01/08/2019), 950 mg (01/22/2019), 950 mg (02/04/2019), 950 mg (02/18/2019), 950 mg (03/04/2019), 950  mg (03/18/2019), 950 mg (04/01/2019), 950 mg (04/14/2019), 950 mg (04/28/2019) fluorouracil (ADRUCIL) 5,650 mg in sodium chloride 0.9 % 137 mL chemo infusion, 2,400 mg/m2 = 5,650 mg, Intravenous, 1 Day/Dose, 32 of 35 cycles Administration: 5,650 mg (01/09/2018), 5,650 mg (01/23/2018), 5,650 mg (02/11/2018), 5,650 mg (02/25/2018), 5,650 mg (03/12/2018), 5,650 mg (03/27/2018), 5,650 mg (04/10/2018), 5,650 mg (04/24/2018), 5,650 mg (05/08/2018), 5,650 mg (05/22/2018), 5,650 mg (06/10/2018), 5,650 mg (06/26/2018), 5,650 mg (07/10/2018), 5,650 mg (07/24/2018), 5,650 mg (08/13/2018), 5,650 mg (08/27/2018), 5,650 mg (09/10/2018), 5,650 mg (09/24/2018), 5,650 mg (10/08/2018), 5,650 mg (11/05/2018), 5,650 mg (11/19/2018), 5,650 mg (12/04/2018), 5,650 mg (12/18/2018), 5,650 mg (01/08/2019), 5,650 mg (01/22/2019), 5,650 mg (02/04/2019), 5,650 mg (02/18/2019), 5,650 mg (03/04/2019), 5,650 mg (03/18/2019), 5,650 mg (04/01/2019), 5,650 mg (04/14/2019), 5,650 mg (04/28/2019) bevacizumab-awwb (MVASI) 500 mg in sodium chloride 0.9 % 100 mL chemo infusion, 4.7 mg/kg = 525 mg (100 % of original dose 5 mg/kg), Intravenous,  Once, 1 of 1 cycle Dose modification: 5 mg/kg (original dose 5 mg/kg, Cycle 24) Administration: 500 mg (01/08/2019) bevacizumab-bvzr (ZIRABEV) 500 mg in sodium chloride 0.9 % 100 mL chemo infusion, 5 mg/kg = 500 mg (100 % of original dose 5 mg/kg), Intravenous,  Once, 11 of 14 cycles Dose modification: 5 mg/kg (original dose 5 mg/kg, Cycle 21) Administration: 500 mg (11/19/2018), 500 mg (12/04/2018), 500 mg (12/18/2018), 500 mg (01/22/2019), 500 mg (02/04/2019), 500 mg (02/18/2019), 500 mg (03/04/2019), 500 mg (03/18/2019), 500 mg (04/01/2019), 500 mg (04/14/2019), 500 mg (04/28/2019)  for chemotherapy treatment.    01/23/2018 Cancer Staging   Staging form: Colon and Rectum, AJCC 7th Edition - Pathologic: M1 - Signed by  Higgs, Mathis Dad, MD on 01/23/2018   Malignant neoplasm of overlapping sites of colon [facility])  12/26/2017 Initial Diagnosis   Cancer of  overlapping sites of colon metastatic to intra-abdominal lymph node ([place])   01/09/2018 -  Chemotherapy   The patient had palonosetron (ALOXI) injection 0.25 mg, 0.25 mg, Intravenous,  Once, 13 of 14 cycles Administration: 0.25 mg (01/09/2018), 0.25 mg (01/23/2018), 0.25 mg (02/11/2018), 0.25 mg (02/25/2018), 0.25 mg (03/12/2018), 0.25 mg (03/27/2018), 0.25 mg (04/10/2018), 0.25 mg (04/24/2018), 0.25 mg (05/08/2018), 0.25 mg (05/22/2018), 0.25 mg (06/10/2018), 0.25 mg (06/26/2018), 0.25 mg (07/10/2018) irinotecan (CAMPTOSAR) 420 mg in dextrose 5 % 500 mL chemo infusion, 180 mg/m2 = 420 mg, Intravenous,  Once, 9 of 9 cycles Administration: 420 mg (01/09/2018), 420 mg (01/23/2018), 420 mg (02/11/2018), 420 mg (02/25/2018), 420 mg (03/12/2018), 420 mg (03/27/2018), 420 mg (04/10/2018), 420 mg (04/24/2018), 420 mg (05/08/2018) leucovorin 900 mg in dextrose 5 % 250 mL infusion, 944 mg, Intravenous,  Once, 13 of 14 cycles Administration: 900 mg (01/09/2018), 900 mg (01/23/2018), 900 mg (02/11/2018), 900 mg (02/25/2018), 900 mg (03/12/2018), 900 mg (03/27/2018), 900 mg (04/10/2018), 900 mg (04/24/2018), 900 mg (05/08/2018), 900 mg (06/10/2018), 900 mg (06/26/2018), 900 mg (07/10/2018) fluorouracil (ADRUCIL) chemo injection 950 mg, 400 mg/m2 = 950 mg, Intravenous,  Once, 13 of 14 cycles Administration: 950 mg (01/09/2018), 950 mg (01/23/2018), 950 mg (02/11/2018), 950 mg (02/25/2018), 950 mg (03/12/2018), 950 mg (03/27/2018), 950 mg (04/10/2018), 950 mg (04/24/2018), 950 mg (05/08/2018), 950 mg (05/22/2018), 950 mg (06/10/2018), 950 mg (06/26/2018), 950 mg (07/10/2018) fluorouracil (ADRUCIL) 5,650 mg in sodium chloride 0.9 % 137 mL chemo infusion, 2,400 mg/m2 = 5,650 mg, Intravenous, 1 Day/Dose, 13 of 14 cycles Administration: 5,650 mg (01/09/2018), 5,650 mg (01/23/2018), 5,650 mg (02/11/2018), 5,650 mg (02/25/2018), 5,650 mg (03/12/2018), 5,650 mg (03/27/2018), 5,650 mg (04/10/2018), 5,650 mg (04/24/2018), 5,650 mg (05/08/2018), 5,650 mg (05/22/2018), 5,650 mg (06/10/2018), 5,650 mg  (06/26/2018), 5,650 mg (07/10/2018)  for chemotherapy treatment.    Colon cancer metastasized to multiple sites [facility])  01/16/2018 Initial Diagnosis   Colon cancer metastasized to multiple sites [facility])   01/23/2018 - 02/10/2018 Chemotherapy   The patient had panitumumab (VECTIBIX) 600 mg in sodium chloride 0.9 % 100 mL chemo infusion, 640 mg, Intravenous,  Once, 1 of 3 cycles Administration: 600 mg (01/23/2018)  for chemotherapy treatment.    03/12/2018 - 10/21/2018 Chemotherapy   The patient had bevacizumab (AVASTIN) 500 mg in sodium chloride 0.9 % 100 mL chemo infusion, 525 mg, Intravenous,  Once, 15 of 22 cycles Administration: 500 mg (03/12/2018), 500 mg (03/27/2018), 500 mg (04/10/2018), 500 mg (04/24/2018), 500 mg (06/10/2018), 500 mg (05/08/2018), 500 mg (05/22/2018), 500 mg (06/26/2018), 500 mg (07/10/2018), 500 mg (07/24/2018), 500 mg (08/13/2018), 500 mg (08/27/2018), 500 mg (09/10/2018), 500 mg (09/24/2018), 500 mg (10/08/2018)  for chemotherapy treatment.       CANCER STAGING: Cancer Staging Malignant neoplasm of transverse colon [facility]) Staging form: Colon and Rectum, AJCC 7th Edition - Pathologic stage from 03/04/2015: Stage IIIC (T4a, N2a, cM0) - Signed by Baird Cancer, PA-C on 03/04/2015 - Pathologic: M1 - Signed by Zoila Shutter, MD on 01/23/2018    INTERVAL HISTORY:  Mr. Delacruz 64 y.o. male seen for follow-up of metastatic colon cancer and toxicity assessment prior to next cycle of chemotherapy.  Had nausea for couple of days but denied any vomiting.  Diarrhea is well controlled with Lomotil per day.  Occasional nosebleeds without any major bleeding.  Appetite is 50%.  Energy levels are 25%.  Numbness in the feet has been stable.  REVIEW OF SYSTEMS:  Review of Systems  HENT:   Positive for nosebleeds.   Gastrointestinal: Positive for diarrhea.  Neurological: Positive for numbness.  All other systems reviewed and are negative.    PAST MEDICAL/SURGICAL HISTORY:  Past Medical History:   Diagnosis Date  . Abnormal stress echocardiogram   . Adenocarcinoma of transverse colon ([place]) 02/10/2015  . Alcohol abuse    Heavy Use up until 2010  . Anxiety   . Blood transfusion without reported diagnosis   . Cancer of overlapping sites of colon metastatic to intra-abdominal lymph node ([place]) 12/26/2017  . COPD (chronic obstructive pulmonary disease) ([facility])   . Depression   . Emphysema of lung ([place])   . GERD (gastroesophageal reflux disease)   . Head trauma 2001   closed head injury; coma for 4 weeks  . Hypercholesterolemia   . Hypertension   . ING HERN W/GANGREN RECUR UNILAT/UNSPEC ING HERN 04/21/2009   Qualifier: Diagnosis of  By: Verl Blalock, MD, Delanna Ahmadi PTSD (post-traumatic stress disorder)   . Pulmonary nodule 06/14/2016  . SAH (subarachnoid hemorrhage) ([place]) 08/31/2012  . SDH (subdural hematoma) ([place]) 08/31/2012   Past Surgical History:  Procedure Laterality Date  . BIOPSY  01/25/2016   Procedure: BIOPSY;  Surgeon: Danie Binder, MD;  [facility];  Service: Endoscopy;;  ileum;   . cardiac cath    . COLONOSCOPY  2011   Dr. Oneida Alar: multiple adenomas and hyperplastic polyps  . COLONOSCOPY WITH PROPOFOL N/A 01/12/2015   Procedure: COLONOSCOPY WITH PROPOFOL;  Surgeon: Danie Binder, MD;  [facility];  Service: Endoscopy;  Laterality: N/A;  1030  . COLONOSCOPY WITH PROPOFOL N/A 01/25/2016   Procedure: COLONOSCOPY WITH PROPOFOL;  Surgeon: Danie Binder, MD;  [facility];  Service: Endoscopy;  Laterality: N/A;  1230  . CRANIOTOMY  2001  . ESOPHAGOGASTRODUODENOSCOPY (EGD) WITH PROPOFOL N/A 01/12/2015   Procedure: ESOPHAGOGASTRODUODENOSCOPY (EGD) WITH PROPOFOL;  Surgeon: Danie Binder, MD;  [facility];  Service: Endoscopy;  Laterality: N/A;  . head injury surgery    . HERNIA REPAIR Right 2012   Inguinal- Forestine Na  . KIDNEY SURGERY     >30 years ago  . LAPAROSCOPIC RIGHT HEMI COLECTOMY Left 02/10/2015   Procedure: LAPAROSCOPIC  THEN OPEN RIGHT HEMI COLECTOMY;  Surgeon: [name], MD;  Location: [facility] OR[facility];  Service: General;  Laterality: Left;  . LAPAROTOMY N/A 12/19/2017   Procedure: EXPLORATORY LAPAROTOMY;  Surgeon: Aviva Signs, MD;  Location: [facility];  Service: General;  Laterality: N/A;  . OPEN REDUCTION INTERNAL FIXATION (ORIF) DISTAL RADIAL FRACTURE Right 10/18/2018   Procedure: OPEN REDUCTION INTERNAL FIXATION (ORIF) RIGHT DISTAL RADIAL FRACTURE;  Surgeon: [name], MD;  Location: [facility];  Service: Orthopedics;  Laterality: Right;  block in preop  . OPEN REDUCTION INTERNAL FIXATION (ORIF) DISTAL RADIAL FRACTURE Right 12/20/2018   Procedure: OPEN REDUCTION INTERNAL FIXATION (ORIF) DISTAL RADIUS AND ULNA FRACTURE;  Surgeon: [name], MD;  Location: [facility];  Service: Orthopedics;  Laterality: Right;  block  . POLYPECTOMY  01/25/2016   Procedure: POLYPECTOMY;  Surgeon: Danie Binder, MD;  [facility];  Service: Endoscopy;;  colon  . PORT-A-CATH REMOVAL N/A 02/26/2017   Procedure: MINOR REMOVAL PORT-A-CATH;  Surgeon: Aviva Signs, MD;  Location: [facility];  Service: General;  Laterality: N/A;  Pt to arrive at [place]  N/A 03/24/2015   Procedure: INSERTION PORT-A-CATH;  Surgeon: Jules Husbands, MD;  Location: [facility];  Service: General;  Laterality: N/A;  . PORTACATH PLACEMENT Left 01/07/2018   Procedure: INSERTION PORT A CATH (ATTACHED CATHETER IN LEFT SUBCLAVIAN);  Surgeon: Aviva Signs, MD;  Location: [facility];  Service: General;  Laterality: Left;     SOCIAL HISTORY:  Social History   Socioeconomic History  . Marital status: Divorced    Spouse name: Not on file  . Number of children: 2  . Years of education: 34  . Highest education level: Not on file  Occupational History  . Occupation: disabled/unemployed  . Occupation: unemployed/Mediicaid only    Comment: NO INCOME  Tobacco Use  . Smoking status: Former Smoker     Packs/day: 0.10    Years: 30.00    Pack years: 3.00    Types: Cigarettes    Start date: 02/06/1973    Quit date: 12/09/2017    Years since quitting: 1.3  . Smokeless tobacco: Never Used  . Tobacco comment: Quit on 12/09/2017  Substance and Sexual Activity  . Alcohol use: Yes    Alcohol/week: 0.0 standard drinks    Comment: beer occ, history of ETOH abuse in remote past.   . Drug use: No  . Sexual activity: Never    Partners: Female    Birth control/protection: None  Other Topics Concern  . Not on file  Social History Narrative   Single   Lives alone   Turned down for disability   Social Determinants of Health   Financial Resource Strain:   . Difficulty of Paying Living Expenses:   Food Insecurity:   . Worried About Charity fundraiser in the Last Year:   . Arboriculturist in the Last Year:   Transportation Needs:   . Film/video editor (Medical):   Marland Kitchen Lack of Transportation (Non-Medical):   Physical Activity:   . Days of Exercise per Week:   . Minutes of Exercise per Session:   Stress:   . Feeling of Stress :   Social Connections:   . Frequency of Communication with Friends and Family:   . Frequency of Social Gatherings with Friends and Family:   . Attends Religious Services:   . Active Member of Clubs or Organizations:   . Attends Archivist Meetings:   Marland Kitchen Marital Status:   Intimate Partner Violence:   . Fear of Current or Ex-Partner:   . Emotionally Abused:   Marland Kitchen Physically Abused:   . Sexually Abused:     FAMILY HISTORY:  Family History  Problem Relation Age of Onset  . Pulmonary embolism Mother   . Breast cancer Mother   . Cancer Mother        breast cancer  . Arthritis Mother   . Heart disease Father   . Cancer Father 72       Leukemia  . Cancer Paternal Grandfather        Lung  . Ataxia Neg Hx   . Chorea Neg Hx   . Dementia Neg Hx   . Mental retardation Neg Hx   . Migraines Neg Hx   . Multiple sclerosis Neg Hx   . Neurofibromatosis  Neg Hx   . Neuropathy Neg Hx   . Parkinsonism Neg Hx   . Seizures Neg Hx   . Stroke Neg Hx   . Colon cancer Neg Hx     CURRENT MEDICATIONS:  Outpatient Encounter Medications as of  04/28/2019  Medication Sig Note  . amitriptyline (ELAVIL) 25 MG tablet Take 1 tablet (25 mg total) by mouth at bedtime.   Marland Kitchen atorvastatin (LIPITOR) 20 MG tablet TAKE 1 TABLET BY MOUTH AT BEDTIME (Patient taking differently: Take 20 mg by mouth daily. )   . buPROPion (WELLBUTRIN SR) 150 MG 12 hr tablet Take 150 mg by mouth every morning.    . busPIRone (BUSPAR) 10 MG tablet Take 10 mg by mouth 2 (two) times daily.   . fluorouracil CALGB 67619 in sodium chloride 0.9 % 150 mL Inject 5,650 mg into the vein. Over 46 hours   . FLUoxetine (PROZAC) 20 MG capsule TAKE 1 CAPSULE BY MOUTH 2 TIMES A DAY (Patient taking differently: Take 20 mg by mouth 2 (two) times daily. ) 06/10/2018: 1 tab TID  . gabapentin (NEURONTIN) 300 MG capsule TAKE 1 CAPSULE BY MOUTH THREE TIMES A DAY   . IRINOTECAN HCL IV Inject 420 mg into the vein every 14 (fourteen) days.   Marland Kitchen LEUCOVORIN CALCIUM IV Inject 944 mg into the vein every 14 (fourteen) days.   Marland Kitchen lidocaine-prilocaine (EMLA) cream APPLY A SMALL AMOUNT OVER PORT SITE AND COVER WITH PLASTIC WRAP ONE HOUR PRIOR TO APPOINTMENT   . loperamide (IMODIUM) 2 MG capsule TAKE 2 CAPSULES BY MOUTH TWICE DAILY   . nystatin (MYCOSTATIN/NYSTOP) powder Apply 1 application topically 4 (four) times daily.   Marland Kitchen omeprazole (PRILOSEC) 20 MG capsule Take 20 mg by mouth every morning.    . rivaroxaban (XARELTO) 20 MG TABS tablet Take 1 tablet (20 mg total) by mouth daily with supper.   . Vitamin D, Ergocalciferol, (DRISDOL) 1.25 MG (50000 UT) CAPS capsule Take 50,000 Units by mouth once a week.   . ondansetron (ZOFRAN) 8 MG tablet Take 1 tablet (8 mg total) by mouth 2 (two) times daily as needed for refractory nausea / vomiting. Start on day 3 after chemotherapy. (Patient not taking: Reported on 04/28/2019)   . PROAIR  HFA 108 (90 Base) MCG/ACT inhaler INHALE 2 PUFFS BY MOUTH EVERY 6 HOURS AS NEEDED FOR SHORTNESS OF BREATH/WHEEZING. (Patient not taking: Reported on 04/28/2019)   . prochlorperazine (COMPAZINE) 10 MG tablet Take 1 tablet (10 mg total) by mouth every 6 (six) hours as needed (NAUSEA). (Patient not taking: Reported on 04/28/2019)    Facility-Administered Encounter Medications as of 04/28/2019  Medication  . sodium chloride flush (NS) 0.9 % injection 10 mL    ALLERGIES:  No Known Allergies   PHYSICAL EXAM:  ECOG Performance status: 1  Vitals:   04/28/19 0845  BP: (!) 150/82  Pulse: (!) 101  Resp: 18  Temp: (!) 96 F (35.6 C)  SpO2: 98%   Filed Weights   04/28/19 0845  Weight: 214 lb 6.4 oz (97.3 kg)    Physical Exam Vitals reviewed.  Constitutional:      Appearance: Normal appearance.  Cardiovascular:     Rate and Rhythm: Normal rate and regular rhythm.     Heart sounds: Normal heart sounds.  Pulmonary:     Effort: Pulmonary effort is normal.     Breath sounds: Normal breath sounds.  Abdominal:     General: There is no distension.     Palpations: Abdomen is soft. There is no mass.  Musculoskeletal:        General: No swelling.  Skin:    General: Skin is warm.  Neurological:     General: No focal deficit present.     Mental Status: He is  alert and oriented to person, place, and time.  Psychiatric:        Mood and Affect: Mood normal.        Behavior: Behavior normal.      LABORATORY DATA:  I have reviewed the labs as listed.  CBC    Component Value Date/Time   WBC 8.4 04/28/2019 0749   RBC 4.45 04/28/2019 0749   HGB 14.4 04/28/2019 0749   HCT 44.7 04/28/2019 0749   PLT 244 04/28/2019 0749   MCV 100.4 (H) 04/28/2019 0749   MCH 32.4 04/28/2019 0749   MCHC 32.2 04/28/2019 0749   RDW 16.7 (H) 04/28/2019 0749   LYMPHSABS 1.2 04/28/2019 0749   MONOABS 1.0 04/28/2019 0749   EOSABS 0.1 04/28/2019 0749   BASOSABS 0.1 04/28/2019 0749   CMP Latest Ref Rng &  Units 04/28/2019 04/14/2019 04/01/2019  Glucose 70 - 99 mg/dL 106(H) 113(H) 133(H)  BUN 8 - 23 mg/dL _0 Creatinine 0.61 - 1.24 mg/dL 0.94 0.90 0.92  Sodium 135 - 145 mmol/L 137 138 136  Potassium 3.5 - 5.1 mmol/L 4.5 4.5 4.2  Chloride 98 - 111 mmol/L 99 99 96(L)  CO2 22 - 32 mmol/L _1 Calcium 8.9 - 10.3 mg/dL 9.2 9.2 9.2  Total Protein 6.5 - 8.1 g/dL 7.4 6.9 7.1  Total Bilirubin 0.3 - 1.2 mg/dL 0.7 0.5 0.5  Alkaline Phos 38 - 126 U/L 123 94 102  AST 15 - 41 U/L _2 ALT 0 - 44 U/L _3 DIAGNOSTIC IMAGING:  I have independently reviewed the scans.   I have reviewed Venita Lick LPN's note and agree with the documentation.  I personally performed a face-to-face visit, made revisions and my assessment and plan is as follows.    ASSESSMENT & PLAN:   Malignant neoplasm of transverse colon ([place]) 1.  Metastatic colon cancer, K-ras mutation positive, MSI stable: -Maintenance 5-FU/leucovorin and bevacizumab started on 05/22/2018. -CT CAP independently reviewed by me on 03/01/2019 showed peritoneal/omental disease which is stable. -I reviewed CBC and LFTs which are grossly within normal limits.  Last CEA was 10.7 on 04/14/2019. -He will proceed with his next treatment today.  We will reevaluate him in 2 weeks.  I plan to repeat scans in 4 to 6 months.  2.  Pulmonary embolism: -Incidental PE on chest CT on 05/20/2018. -He is continuing Xarelto without any bleeding issues.  3.  Peripheral neuropathy: -Numbness in the feet has been stable from prior oxaliplatin therapy.  5.  Diarrhea: -He is continuing to average Lomotil 2 to 3 tablets daily.  Diarrhea is well controlled.  6.  Multiple falls: -He has history of multiple falls since he had a head injury many years ago.  He does not have any warning signs prior to fall.  Latest brain MRI was negative. -He has had fracture of the right wrist and medial clavicle and follows up with Dr. Fredna Dow.     Orders placed  this encounter:  Orders Placed This Encounter  Procedures  . CBC with Differential/Platelet  . Comprehensive metabolic panel  . Magnesium      Derek Jack, MD [place] [phone]

## 2019-04-28 NOTE — Assessment & Plan Note (Signed)
1.  Metastatic colon cancer, K-ras mutation positive, MSI stable: -Maintenance 5-FU/leucovorin and bevacizumab started on 05/22/2018. -CT CAP independently reviewed by me on 03/01/2019 showed peritoneal/omental disease which is stable. -I reviewed CBC and LFTs which are grossly within normal limits.  Last CEA was 10.7 on 04/14/2019. -He will proceed with his next treatment today.  We will reevaluate him in 2 weeks.  I plan to repeat scans in 4 to 6 months.  2.  Pulmonary embolism: -Incidental PE on chest CT on 05/20/2018. -He is continuing Xarelto without any bleeding issues.  3.  Peripheral neuropathy: -Numbness in the feet has been stable from prior oxaliplatin therapy.  5.  Diarrhea: -He is continuing to average Lomotil 2 to 3 tablets daily.  Diarrhea is well controlled.  6.  Multiple falls: -He has history of multiple falls since he had a head injury many years ago.  He does not have any warning signs prior to fall.  Latest brain MRI was negative. -He has had fracture of the right wrist and medial clavicle and follows up with Dr. Fredna Dow.

## 2019-04-28 NOTE — Patient Instructions (Signed)
[facility] Discharge Instructions for Patients Receiving Chemotherapy   Beginning January 23rd 2017 lab work for the [facility] will be done in the  Main lab at [facility] on 1st floor. If you have a lab appointment with the [facility] please come in thru the  Main Entrance and check in at the main information desk   Today you received the following chemotherapy agents Avastin, Leucovorin, 5FU  To help prevent nausea and vomiting after your treatment, we encourage you to take your nausea medication   If you develop nausea and vomiting, or diarrhea that is not controlled by your medication, call the clinic.  The clinic phone number is [phone]. Office hours are Monday-Friday 8:30am-5:00pm.  BELOW ARE SYMPTOMS THAT SHOULD BE REPORTED IMMEDIATELY:  *FEVER GREATER THAN 101.0 F  *CHILLS WITH OR WITHOUT FEVER  NAUSEA AND VOMITING THAT IS NOT CONTROLLED WITH YOUR NAUSEA MEDICATION  *UNUSUAL SHORTNESS OF BREATH  *UNUSUAL BRUISING OR BLEEDING  TENDERNESS IN MOUTH AND THROAT WITH OR WITHOUT PRESENCE OF ULCERS  *URINARY PROBLEMS  *BOWEL PROBLEMS  UNUSUAL RASH Items with * indicate a potential emergency and should be followed up as soon as possible. If you have an emergency after office hours please contact your primary care physician or go to the nearest emergency department.  Please call the clinic during office hours if you have any questions or concerns.   You may also contact the Patient Navigator at [phone] should you have any questions or need assistance in obtaining follow up care.      Resources For Cancer Patients and their Caregivers ? American [employer] Society: Can assist with transportation, wigs, general needs, runs Look Good Feel Better.        (972)666-1573 ? Cancer Care: Provides financial assistance, online support groups, medication/co-pay assistance.  1-800-813-HOPE 334-260-3186) ? [place] Assists  [place] cancer patients and their families through emotional , educational and financial support.  8034020665 ? [place] DSS Where to apply for food stamps, Medicaid and utility assistance. 708-482-0556 ? [facility]: Transportation to medical appointments. 337-472-0027 ? Social Security Administration: May apply for disability if have a Stage IV cancer. 408-836-9731 (580)363-2569 ? [employer], Disability and Transit Services: Assists with nutrition, care and transit needs. 7753394999

## 2019-04-29 ENCOUNTER — Other Ambulatory Visit (HOSPITAL_COMMUNITY): Payer: Self-pay | Admitting: Hematology

## 2019-04-29 DIAGNOSIS — I2699 Other pulmonary embolism without acute cor pulmonale: Secondary | ICD-10-CM

## 2019-04-29 DIAGNOSIS — C184 Malignant neoplasm of transverse colon: Secondary | ICD-10-CM

## 2019-04-29 LAB — CEA: CEA: 11.6 ng/mL — ABNORMAL HIGH (ref 0.0–4.7)

## 2019-04-30 ENCOUNTER — Inpatient Hospital Stay (HOSPITAL_COMMUNITY): Payer: Medicaid Other

## 2019-04-30 ENCOUNTER — Other Ambulatory Visit: Payer: Self-pay

## 2019-04-30 ENCOUNTER — Encounter (HOSPITAL_COMMUNITY): Payer: Self-pay

## 2019-04-30 VITALS — BP 132/65 | HR 92 | Temp 98.7°F | Resp 18

## 2019-04-30 DIAGNOSIS — C184 Malignant neoplasm of transverse colon: Secondary | ICD-10-CM

## 2019-04-30 DIAGNOSIS — Z5111 Encounter for antineoplastic chemotherapy: Secondary | ICD-10-CM | POA: Diagnosis not present

## 2019-04-30 DIAGNOSIS — C188 Malignant neoplasm of overlapping sites of colon: Secondary | ICD-10-CM

## 2019-04-30 MED ORDER — SODIUM CHLORIDE 0.9% FLUSH
10.0000 mL | INTRAVENOUS | Status: DC | PRN
  Administered 2019-04-30: 10 mL

## 2019-04-30 MED ORDER — HEPARIN SOD (PORK) LOCK FLUSH 100 UNIT/ML IV SOLN
500.0000 [IU] | Freq: Once | INTRAVENOUS | Status: AC | PRN
  Administered 2019-04-30: 500 [IU]

## 2019-04-30 NOTE — Progress Notes (Signed)
Patients chemotherapy pump disconnected with no alarms noted.  Port site clean and dry with good blood return noted before and after flush and no complaints of pain.  No bruising or swelling noted at site.  Band aid applied.  VSs with discharge and left ambulatory with no s/s of distress noted.

## 2019-05-02 ENCOUNTER — Ambulatory Visit: Payer: Medicaid Other | Attending: Internal Medicine

## 2019-05-02 DIAGNOSIS — Z23 Encounter for immunization: Secondary | ICD-10-CM

## 2019-05-02 NOTE — Progress Notes (Signed)
   Covid-19 Vaccination Clinic  Name:  Erik [name]    MRN: [id number] DOB: 03-26-1955  05/02/2019  [name] was observed post Covid-19 immunization for 15 minutes without incident. He was provided with Vaccine Information Sheet and instruction to access the V-Safe system.   [name] was instructed to call 911 with any severe reactions post vaccine: Marland Kitchen Difficulty breathing  . Swelling of face and throat  . A fast heartbeat  . A bad rash all over body  . Dizziness and weakness   Immunizations Administered    Name Date Dose VIS Date Route   Moderna COVID-19 Vaccine 05/02/2019 10:43 AM 0.5 mL 01/07/2019 Intramuscular   Manufacturer: Moderna   Lot: [id number]   [id number]

## 2019-05-05 ENCOUNTER — Other Ambulatory Visit (HOSPITAL_COMMUNITY): Payer: Self-pay | Admitting: *Deleted

## 2019-05-05 DIAGNOSIS — C188 Malignant neoplasm of overlapping sites of colon: Secondary | ICD-10-CM

## 2019-05-07 NOTE — Progress Notes (Signed)

## 2019-05-13 ENCOUNTER — Inpatient Hospital Stay (HOSPITAL_COMMUNITY): Payer: Medicaid Other

## 2019-05-13 ENCOUNTER — Inpatient Hospital Stay (HOSPITAL_BASED_OUTPATIENT_CLINIC_OR_DEPARTMENT_OTHER): Payer: Medicaid Other | Admitting: Hematology

## 2019-05-13 ENCOUNTER — Other Ambulatory Visit: Payer: Self-pay

## 2019-05-13 ENCOUNTER — Inpatient Hospital Stay (HOSPITAL_COMMUNITY): Payer: Medicaid Other | Attending: Hematology

## 2019-05-13 VITALS — BP 151/82 | HR 101 | Temp 97.5°F | Resp 18

## 2019-05-13 DIAGNOSIS — C184 Malignant neoplasm of transverse colon: Secondary | ICD-10-CM | POA: Insufficient documentation

## 2019-05-13 DIAGNOSIS — Z5112 Encounter for antineoplastic immunotherapy: Secondary | ICD-10-CM | POA: Insufficient documentation

## 2019-05-13 DIAGNOSIS — C188 Malignant neoplasm of overlapping sites of colon: Secondary | ICD-10-CM

## 2019-05-13 DIAGNOSIS — Z5111 Encounter for antineoplastic chemotherapy: Secondary | ICD-10-CM | POA: Diagnosis not present

## 2019-05-13 LAB — CBC WITH DIFFERENTIAL/PLATELET
Abs Immature Granulocytes: 0.03 10*3/uL (ref 0.00–0.07)
Basophils Absolute: 0.1 10*3/uL (ref 0.0–0.1)
Basophils Relative: 1 %
Eosinophils Absolute: 0.1 10*3/uL (ref 0.0–0.5)
Eosinophils Relative: 2 %
HCT: 45.5 % (ref 39.0–52.0)
Hemoglobin: 14.7 g/dL (ref 13.0–17.0)
Immature Granulocytes: 1 %
Lymphocytes Relative: 19 %
Lymphs Abs: 1.1 10*3/uL (ref 0.7–4.0)
MCH: 33 pg (ref 26.0–34.0)
MCHC: 32.3 g/dL (ref 30.0–36.0)
MCV: 102 fL — ABNORMAL HIGH (ref 80.0–100.0)
Monocytes Absolute: 0.8 10*3/uL (ref 0.1–1.0)
Monocytes Relative: 13 %
Neutro Abs: 3.9 10*3/uL (ref 1.7–7.7)
Neutrophils Relative %: 64 %
Platelets: 185 10*3/uL (ref 150–400)
RBC: 4.46 MIL/uL (ref 4.22–5.81)
RDW: 16.5 % — ABNORMAL HIGH (ref 11.5–15.5)
WBC: 6.1 10*3/uL (ref 4.0–10.5)
nRBC: 0 % (ref 0.0–0.2)

## 2019-05-13 LAB — COMPREHENSIVE METABOLIC PANEL
ALT: 11 U/L (ref 0–44)
AST: 18 U/L (ref 15–41)
Albumin: 3.7 g/dL (ref 3.5–5.0)
Alkaline Phosphatase: 115 U/L (ref 38–126)
Anion gap: 10 (ref 5–15)
BUN: 8 mg/dL (ref 8–23)
CO2: 28 mmol/L (ref 22–32)
Calcium: 9.4 mg/dL (ref 8.9–10.3)
Chloride: 98 mmol/L (ref 98–111)
Creatinine, Ser: 0.95 mg/dL (ref 0.61–1.24)
GFR calc Af Amer: >60 mL/min (ref >60)
GFR calc non Af Amer: >60 mL/min (ref >60)
Glucose, Bld: 113 mg/dL — ABNORMAL HIGH (ref 70–99)
Potassium: 4.5 mmol/L (ref 3.5–5.1)
Sodium: 136 mmol/L (ref 135–145)
Total Bilirubin: 0.8 mg/dL (ref 0.3–1.2)
Total Protein: 7.2 g/dL (ref 6.5–8.1)

## 2019-05-13 LAB — MAGNESIUM: Magnesium: 2 mg/dL (ref 1.7–2.4)

## 2019-05-13 MED ORDER — SODIUM CHLORIDE 0.9 % IV SOLN: Freq: Once | INTRAVENOUS | Status: AC

## 2019-05-13 MED ORDER — FLUOROURACIL CHEMO INJECTION 2.5 GM/50ML
400.0000 mg/m2 | Freq: Once | INTRAVENOUS | Status: AC
  Administered 2019-05-13: 950 mg via INTRAVENOUS
  Filled 2019-05-13: qty 19

## 2019-05-13 MED ORDER — ATROPINE SULFATE 1 MG/ML IJ SOLN
0.5000 mg | Freq: Once | INTRAMUSCULAR | Status: AC
  Administered 2019-05-13: 0.5 mg via INTRAVENOUS

## 2019-05-13 MED ORDER — ATROPINE SULFATE 1 MG/ML IJ SOLN
INTRAMUSCULAR | Status: AC
  Filled 2019-05-13: qty 1

## 2019-05-13 MED ORDER — SODIUM CHLORIDE 0.9 % IV SOLN
5.0000 mg/kg | Freq: Once | INTRAVENOUS | Status: AC
  Administered 2019-05-13: 500 mg via INTRAVENOUS
  Filled 2019-05-13: qty 4

## 2019-05-13 MED ORDER — PALONOSETRON HCL INJECTION 0.25 MG/5ML
0.2500 mg | Freq: Once | INTRAVENOUS | Status: AC
  Administered 2019-05-13: 0.25 mg via INTRAVENOUS

## 2019-05-13 MED ORDER — SODIUM CHLORIDE 0.9 % IV SOLN
10.0000 mg | Freq: Once | INTRAVENOUS | Status: AC
  Administered 2019-05-13: 10 mg via INTRAVENOUS
  Filled 2019-05-13: qty 10

## 2019-05-13 MED ORDER — PALONOSETRON HCL INJECTION 0.25 MG/5ML
INTRAVENOUS | Status: AC
  Filled 2019-05-13: qty 5

## 2019-05-13 MED ORDER — SODIUM CHLORIDE 0.9 % IV SOLN
900.0000 mg | Freq: Once | INTRAVENOUS | Status: AC
  Administered 2019-05-13: 900 mg via INTRAVENOUS
  Filled 2019-05-13: qty 35

## 2019-05-13 MED ORDER — SODIUM CHLORIDE 0.9 % IV SOLN
2400.0000 mg/m2 | INTRAVENOUS | Status: DC
  Administered 2019-05-13: 5650 mg via INTRAVENOUS
  Filled 2019-05-13: qty 113

## 2019-05-13 NOTE — Progress Notes (Signed)
Patient has been assessed, vital signs and labs have been reviewed by Dr. Katragadda. ANC, Creatinine, LFTs, and Platelets are within treatment parameters per Dr. Katragadda. The patient is good to proceed with treatment at this time.  

## 2019-05-13 NOTE — Progress Notes (Signed)
OK to proceed with HR 107 per Dr Delton Coombes note.  Henreitta Leber, PharmD

## 2019-05-13 NOTE — Patient Instructions (Signed)
[facility] Discharge Instructions for Patients Receiving Chemotherapy  Today you received the following chemotherapy agents   To help prevent nausea and vomiting after your treatment, we encourage you to take your nausea medication   If you develop nausea and vomiting that is not controlled by your nausea medication, call the clinic.   BELOW ARE SYMPTOMS THAT SHOULD BE REPORTED IMMEDIATELY:  *FEVER GREATER THAN 100.5 F  *CHILLS WITH OR WITHOUT FEVER  NAUSEA AND VOMITING THAT IS NOT CONTROLLED WITH YOUR NAUSEA MEDICATION  *UNUSUAL SHORTNESS OF BREATH  *UNUSUAL BRUISING OR BLEEDING  TENDERNESS IN MOUTH AND THROAT WITH OR WITHOUT PRESENCE OF ULCERS  *URINARY PROBLEMS  *BOWEL PROBLEMS  UNUSUAL RASH Items with * indicate a potential emergency and should be followed up as soon as possible.  Feel free to call the clinic should you have any questions or concerns. The clinic phone number is [phone].  Please show the CHEMO ALERT CARD at check-in to the Emergency Department and triage nurse.   

## 2019-05-13 NOTE — Progress Notes (Signed)
[place], [place] [id number]   CLINIC:  Medical Oncology/Hematology  PCP:  Alanson Puls, [facility] [place] [address] [phone]   REASON FOR VISIT:  Follow-up for metastatic colon cancer   BRIEF ONCOLOGIC HISTORY:  Oncology History Overview Note  Stage IIIC Erik [name]) adenocarcinoma of transverse colon, diagnosed on colonoscopy by Dr. Oneida Alar on 01/12/2015 followed by definitive surgery by Dr. [name] with right hemicolectomy on 02/12/2015.  He then underwent FOLFOX x 12 cycles in the adjuvant setting (03/30/2015- 09/06/2015).   Malignant neoplasm of transverse colon ([place])  01/12/2015 Pathologic Stage   Colon, biopsy, distal transverse - TUBULOVILLOUS ADENOMA WITH HIGH GRADE DYSPLASIA.   01/12/2015 Procedure   Colonoscopy by Dr. Oneida Alar.   01/12/2015 Tumor Marker   CEA: 6.6 (H)    01/18/2015 Imaging   CT abd/pelvis- Apple-core lesion identified in the mid transverse colon without obstruction. No evidence for lymphadenopathy in the gastrohepatic ligament or omentum.  Stable 8 mm hypo attenuating lesion in the left liver, likely a cyst.   02/10/2015 Initial Diagnosis   Adenocarcinoma of transverse colon ([place])   02/12/2015 Definitive Surgery   [name], Extended right hemicolectomy    02/12/2015 Pathology Results   Mucinous adenocarcinoma with penetration of visceral peritoneum, 4/19 lymph nodes for metastatic disease, negative resection margins, with LVI and perineural invasion   03/30/2015 - 09/06/2015 Chemotherapy   FOLFOX x 12 cycles   05/25/2015 Treatment Plan Change   5 FU bolus discontinued for cycle #5   06/08/2015 Treatment Plan Change   Treatment deferred x 1 week   06/15/2015 Treatment Plan Change   5FU CI decreased by 10% and Oxaliplatin reduced by 15% for cycles #6-#11; Oxaliplatin dropped for cycle #12 d/t neuropathy.    10/20/2015 Imaging   CT CAP- Right hemicolectomy without evidence of  metastatic disease. 2. Previously measured ground-glass lesion in the left upper lobe has resolved. 3. Probable food debris in the stomach, simulating gastric wall thickening. Please correlate clinically. 4. 6 mm irregular nodular density in the left upper lobe, stable. Continued attention on followup exams is warranted.   01/25/2016 Procedure   Colonoscopy by Dr. Oneida Alar- Non-thrombosed external hemorrhoids found on digital rectal exam. - One 4 mm polyp in the rectum, removed with a cold biopsy forceps. Resected and retrieved. - Congested mucosa in the neo-terminal ileum. Biopsied. - Redundant colon. - Internal hemorrhoids.   01/26/2016 Pathology Results   1. Terminal ileum, biopsy - MILD ACUTE (ACTIVE) ILEITIS. - NO DYSPLASIA OR MALIGNANCY IDENTIFIED - SEE COMMENT. 2. Rectum, polyp(s) - HYPERPLASTIC POLYP (X 1). - NO DYSPLASIA OR MALIGNANCY IDENTIFIED.   04/13/2016 Imaging   CT chest- Stable CT chest. 6 mm irregular nodule anterior left upper lobe is stable. The scattered areas of peribronchovascular micro nodularity in the lungs bilaterally are unchanged.   10/19/2016 Imaging   CT CAP: 1. Status post right hemicolectomy. No findings to suggest metastatic disease in the abdomen or pelvis. 2. Aortic atherosclerosis. 3. Additional incidental findings, as above. Aortic Atherosclerosis (ICD10-I70.0).   01/09/2018 -  Chemotherapy   The patient had palonosetron (ALOXI) injection 0.25 mg, 0.25 mg, Intravenous,  Once, 33 of 35 cycles Administration: 0.25 mg (01/09/2018), 0.25 mg (01/23/2018), 0.25 mg (02/11/2018), 0.25 mg (02/25/2018), 0.25 mg (03/12/2018), 0.25 mg (03/27/2018), 0.25 mg (04/10/2018), 0.25 mg (04/24/2018), 0.25 mg (05/08/2018), 0.25 mg (05/22/2018), 0.25 mg (06/10/2018), 0.25 mg (06/26/2018), 0.25 mg (07/10/2018), 0.25 mg (07/24/2018), 0.25 mg (08/13/2018), 0.25 mg (08/27/2018),  0.25 mg (09/10/2018), 0.25 mg (09/24/2018), 0.25 mg (10/08/2018), 0.25 mg (11/05/2018), 0.25 mg (11/19/2018), 0.25 mg  (12/04/2018), 0.25 mg (12/18/2018), 0.25 mg (01/08/2019), 0.25 mg (01/22/2019), 0.25 mg (02/04/2019), 0.25 mg (02/18/2019), 0.25 mg (03/04/2019), 0.25 mg (03/18/2019), 0.25 mg (04/01/2019), 0.25 mg (04/14/2019), 0.25 mg (04/28/2019), 0.25 mg (05/13/2019) irinotecan (CAMPTOSAR) 420 mg in dextrose 5 % 500 mL chemo infusion, 180 mg/m2 = 420 mg, Intravenous,  Once, 9 of 9 cycles Administration: 420 mg (01/09/2018), 420 mg (01/23/2018), 420 mg (02/11/2018), 420 mg (02/25/2018), 420 mg (03/12/2018), 420 mg (03/27/2018), 420 mg (04/10/2018), 420 mg (04/24/2018), 420 mg (05/08/2018) leucovorin 900 mg in dextrose 5 % 250 mL infusion, 944 mg, Intravenous,  Once, 33 of 35 cycles Administration: 900 mg (01/09/2018), 900 mg (01/23/2018), 900 mg (02/11/2018), 900 mg (02/25/2018), 900 mg (03/12/2018), 900 mg (03/27/2018), 900 mg (04/10/2018), 900 mg (04/24/2018), 900 mg (05/08/2018), 900 mg (06/10/2018), 900 mg (06/26/2018), 900 mg (07/10/2018), 900 mg (07/24/2018), 900 mg (08/13/2018), 900 mg (08/27/2018), 900 mg (09/10/2018), 900 mg (09/24/2018), 900 mg (10/08/2018), 900 mg (11/05/2018), 900 mg (11/19/2018), 900 mg (12/04/2018), 900 mg (12/18/2018), 900 mg (01/08/2019), 900 mg (01/22/2019), 900 mg (02/04/2019), 900 mg (02/18/2019), 900 mg (03/04/2019), 900 mg (03/18/2019), 900 mg (04/01/2019), 900 mg (04/14/2019), 900 mg (04/28/2019), 900 mg (05/13/2019) fluorouracil (ADRUCIL) chemo injection 950 mg, 400 mg/m2 = 950 mg, Intravenous,  Once, 33 of 35 cycles Administration: 950 mg (01/09/2018), 950 mg (01/23/2018), 950 mg (02/11/2018), 950 mg (02/25/2018), 950 mg (03/12/2018), 950 mg (03/27/2018), 950 mg (04/10/2018), 950 mg (04/24/2018), 950 mg (05/08/2018), 950 mg (05/22/2018), 950 mg (06/10/2018), 950 mg (06/26/2018), 950 mg (07/10/2018), 950 mg (07/24/2018), 950 mg (08/13/2018), 950 mg (08/27/2018), 950 mg (09/10/2018), 950 mg (09/24/2018), 950 mg (10/08/2018), 950 mg (11/05/2018), 950 mg (11/19/2018), 950 mg (12/04/2018), 950 mg (12/18/2018), 950 mg (01/08/2019), 950 mg (01/22/2019), 950 mg (02/04/2019), 950  mg (02/18/2019), 950 mg (03/04/2019), 950 mg (03/18/2019), 950 mg (04/01/2019), 950 mg (04/14/2019), 950 mg (04/28/2019), 950 mg (05/13/2019) fluorouracil (ADRUCIL) 5,650 mg in sodium chloride 0.9 % 137 mL chemo infusion, 2,400 mg/m2 = 5,650 mg, Intravenous, 1 Day/Dose, 33 of 35 cycles Administration: 5,650 mg (01/09/2018), 5,650 mg (01/23/2018), 5,650 mg (02/11/2018), 5,650 mg (02/25/2018), 5,650 mg (03/12/2018), 5,650 mg (03/27/2018), 5,650 mg (04/10/2018), 5,650 mg (04/24/2018), 5,650 mg (05/08/2018), 5,650 mg (05/22/2018), 5,650 mg (06/10/2018), 5,650 mg (06/26/2018), 5,650 mg (07/10/2018), 5,650 mg (07/24/2018), 5,650 mg (08/13/2018), 5,650 mg (08/27/2018), 5,650 mg (09/10/2018), 5,650 mg (09/24/2018), 5,650 mg (10/08/2018), 5,650 mg (11/05/2018), 5,650 mg (11/19/2018), 5,650 mg (12/04/2018), 5,650 mg (12/18/2018), 5,650 mg (01/08/2019), 5,650 mg (01/22/2019), 5,650 mg (02/04/2019), 5,650 mg (02/18/2019), 5,650 mg (03/04/2019), 5,650 mg (03/18/2019), 5,650 mg (04/01/2019), 5,650 mg (04/14/2019), 5,650 mg (04/28/2019), 5,650 mg (05/13/2019) bevacizumab-awwb (MVASI) 500 mg in sodium chloride 0.9 % 100 mL chemo infusion, 4.7 mg/kg = 525 mg (100 % of original dose 5 mg/kg), Intravenous,  Once, 1 of 1 cycle Dose modification: 5 mg/kg (original dose 5 mg/kg, Cycle 24) Administration: 500 mg (01/08/2019) bevacizumab-bvzr (ZIRABEV) 500 mg in sodium chloride 0.9 % 100 mL chemo infusion, 5 mg/kg = 500 mg (100 % of original dose 5 mg/kg), Intravenous,  Once, 12 of 14 cycles Dose modification: 5 mg/kg (original dose 5 mg/kg, Cycle 21) Administration: 500 mg (11/19/2018), 500 mg (12/04/2018), 500 mg (12/18/2018), 500 mg (01/22/2019), 500 mg (02/04/2019), 500 mg (02/18/2019), 500 mg (03/04/2019), 500 mg (03/18/2019), 500 mg (04/01/2019), 500 mg (04/14/2019), 500 mg (04/28/2019), 500 mg (05/13/2019)  for chemotherapy treatment.    01/23/2018 Cancer Staging  Staging form: Colon and Rectum, AJCC 7th Edition - Pathologic: M1 - Signed by Zoila Shutter, MD on 01/23/2018     Malignant neoplasm of overlapping sites of colon ([place])  12/26/2017 Initial Diagnosis   Cancer of overlapping sites of colon metastatic to intra-abdominal lymph node ([place])   01/09/2018 -  Chemotherapy   The patient had palonosetron (ALOXI) injection 0.25 mg, 0.25 mg, Intravenous,  Once, 13 of 14 cycles Administration: 0.25 mg (01/09/2018), 0.25 mg (01/23/2018), 0.25 mg (02/11/2018), 0.25 mg (02/25/2018), 0.25 mg (03/12/2018), 0.25 mg (03/27/2018), 0.25 mg (04/10/2018), 0.25 mg (04/24/2018), 0.25 mg (05/08/2018), 0.25 mg (05/22/2018), 0.25 mg (06/10/2018), 0.25 mg (06/26/2018), 0.25 mg (07/10/2018) irinotecan (CAMPTOSAR) 420 mg in dextrose 5 % 500 mL chemo infusion, 180 mg/m2 = 420 mg, Intravenous,  Once, 9 of 9 cycles Administration: 420 mg (01/09/2018), 420 mg (01/23/2018), 420 mg (02/11/2018), 420 mg (02/25/2018), 420 mg (03/12/2018), 420 mg (03/27/2018), 420 mg (04/10/2018), 420 mg (04/24/2018), 420 mg (05/08/2018) leucovorin 900 mg in dextrose 5 % 250 mL infusion, 944 mg, Intravenous,  Once, 13 of 14 cycles Administration: 900 mg (01/09/2018), 900 mg (01/23/2018), 900 mg (02/11/2018), 900 mg (02/25/2018), 900 mg (03/12/2018), 900 mg (03/27/2018), 900 mg (04/10/2018), 900 mg (04/24/2018), 900 mg (05/08/2018), 900 mg (06/10/2018), 900 mg (06/26/2018), 900 mg (07/10/2018) fluorouracil (ADRUCIL) chemo injection 950 mg, 400 mg/m2 = 950 mg, Intravenous,  Once, 13 of 14 cycles Administration: 950 mg (01/09/2018), 950 mg (01/23/2018), 950 mg (02/11/2018), 950 mg (02/25/2018), 950 mg (03/12/2018), 950 mg (03/27/2018), 950 mg (04/10/2018), 950 mg (04/24/2018), 950 mg (05/08/2018), 950 mg (05/22/2018), 950 mg (06/10/2018), 950 mg (06/26/2018), 950 mg (07/10/2018) fluorouracil (ADRUCIL) 5,650 mg in sodium chloride 0.9 % 137 mL chemo infusion, 2,400 mg/m2 = 5,650 mg, Intravenous, 1 Day/Dose, 13 of 14 cycles Administration: 5,650 mg (01/09/2018), 5,650 mg (01/23/2018), 5,650 mg (02/11/2018), 5,650 mg (02/25/2018), 5,650 mg (03/12/2018), 5,650 mg (03/27/2018), 5,650 mg (04/10/2018),  5,650 mg (04/24/2018), 5,650 mg (05/08/2018), 5,650 mg (05/22/2018), 5,650 mg (06/10/2018), 5,650 mg (06/26/2018), 5,650 mg (07/10/2018)  for chemotherapy treatment.    Colon cancer metastasized to multiple sites [facility])  01/16/2018 Initial Diagnosis   Colon cancer metastasized to multiple sites [facility])   01/23/2018 - 02/10/2018 Chemotherapy   The patient had panitumumab (VECTIBIX) 600 mg in sodium chloride 0.9 % 100 mL chemo infusion, 640 mg, Intravenous,  Once, 1 of 3 cycles Administration: 600 mg (01/23/2018)  for chemotherapy treatment.    03/12/2018 - 10/21/2018 Chemotherapy   The patient had bevacizumab (AVASTIN) 500 mg in sodium chloride 0.9 % 100 mL chemo infusion, 525 mg, Intravenous,  Once, 15 of 22 cycles Administration: 500 mg (03/12/2018), 500 mg (03/27/2018), 500 mg (04/10/2018), 500 mg (04/24/2018), 500 mg (06/10/2018), 500 mg (05/08/2018), 500 mg (05/22/2018), 500 mg (06/26/2018), 500 mg (07/10/2018), 500 mg (07/24/2018), 500 mg (08/13/2018), 500 mg (08/27/2018), 500 mg (09/10/2018), 500 mg (09/24/2018), 500 mg (10/08/2018)  for chemotherapy treatment.       CANCER STAGING: Cancer Staging Malignant neoplasm of transverse colon [facility]) Staging form: Colon and Rectum, AJCC 7th Edition - Pathologic stage from 03/04/2015: Stage IIIC (T4a, N2a, cM0) - Signed by Baird Cancer, PA-C on 03/04/2015 - Pathologic: M1 - Signed by Zoila Shutter, MD on 01/23/2018    INTERVAL HISTORY:  Erik [name] 64 y.o. male seen for follow-up of metastatic colon cancer and toxicity assessment prior to next cycle of chemotherapy.  Reports appetite and energy levels of 25%.  Reports some soreness in the back of the throat.  Reports more  tiredness during the second week after treatment.  Nose bleeds when he blows it hard.  No other bleeding reported.  No new onset pains.  Diarrhea is fairly well controlled.  No falls in the last 2 weeks.  REVIEW OF SYSTEMS:  Review of Systems  HENT:   Positive for nosebleeds.   Gastrointestinal: Positive  for diarrhea.  Neurological: Positive for numbness.  All other systems reviewed and are negative.    PAST MEDICAL/SURGICAL HISTORY:  Past Medical History:  Diagnosis Date  . Abnormal stress echocardiogram   . Adenocarcinoma of transverse colon ([place]) 02/10/2015  . Alcohol abuse    Heavy Use up until 2010  . Anxiety   . Blood transfusion without reported diagnosis   . Cancer of overlapping sites of colon metastatic to intra-abdominal lymph node ([place]) 12/26/2017  . COPD (chronic obstructive pulmonary disease) ([place])   . Depression   . Emphysema of lung ([place])   . GERD (gastroesophageal reflux disease)   . Head trauma 2001   closed head injury; coma for 4 weeks  . Hypercholesterolemia   . Hypertension   . ING HERN W/GANGREN RECUR UNILAT/UNSPEC ING HERN 04/21/2009   Qualifier: Diagnosis of  By: Verl Blalock, MD, Delanna Ahmadi PTSD (post-traumatic stress disorder)   . Pulmonary nodule 06/14/2016  . SAH (subarachnoid hemorrhage) ([place]) 08/31/2012  . SDH (subdural hematoma) ([place]) 08/31/2012   Past Surgical History:  Procedure Laterality Date  . BIOPSY  01/25/2016   Procedure: BIOPSY;  Surgeon: Danie Binder, MD;  [facility];  Service: Endoscopy;;  ileum;   . cardiac cath    . COLONOSCOPY  2011   Dr. Oneida Alar: multiple adenomas and hyperplastic polyps  . COLONOSCOPY WITH PROPOFOL N/A 01/12/2015   Procedure: COLONOSCOPY WITH PROPOFOL;  Surgeon: Danie Binder, MD;  [facility];  Service: Endoscopy;  Laterality: N/A;  1030  . COLONOSCOPY WITH PROPOFOL N/A 01/25/2016   Procedure: COLONOSCOPY WITH PROPOFOL;  Surgeon: Danie Binder, MD;  [facility];  Service: Endoscopy;  Laterality: N/A;  1230  . CRANIOTOMY  2001  . ESOPHAGOGASTRODUODENOSCOPY (EGD) WITH PROPOFOL N/A 01/12/2015   Procedure: ESOPHAGOGASTRODUODENOSCOPY (EGD) WITH PROPOFOL;  Surgeon: Danie Binder, MD;  [facility];  Service: Endoscopy;  Laterality: N/A;  . head injury surgery    .  HERNIA REPAIR Right 2012   Inguinal- [place] Na  . KIDNEY SURGERY     >30 years ago  . LAPAROSCOPIC RIGHT HEMI COLECTOMY Left 02/10/2015   Procedure: LAPAROSCOPIC THEN OPEN RIGHT HEMI COLECTOMY;  Surgeon: [name], MD;  Location: [facility] OR[facility];  Service: General;  Laterality: Left;  . LAPAROTOMY N/A 12/19/2017   Procedure: EXPLORATORY LAPAROTOMY;  Surgeon: Aviva Signs, MD;  Location: [facility];  Service: General;  Laterality: N/A;  . OPEN REDUCTION INTERNAL FIXATION (ORIF) DISTAL RADIAL FRACTURE Right 10/18/2018   Procedure: OPEN REDUCTION INTERNAL FIXATION (ORIF) RIGHT DISTAL RADIAL FRACTURE;  Surgeon: [name], MD;  Location: [facility];  Service: Orthopedics;  Laterality: Right;  block in preop  . OPEN REDUCTION INTERNAL FIXATION (ORIF) DISTAL RADIAL FRACTURE Right 12/20/2018   Procedure: OPEN REDUCTION INTERNAL FIXATION (ORIF) DISTAL RADIUS AND ULNA FRACTURE;  Surgeon: [name], MD;  Location: [facility];  Service: Orthopedics;  Laterality: Right;  block  . POLYPECTOMY  01/25/2016   Procedure: POLYPECTOMY;  Surgeon: Danie Binder, MD;  [facility];  Service: Endoscopy;;  colon  . PORT-A-CATH REMOVAL N/A  02/26/2017   Procedure: MINOR REMOVAL PORT-A-CATH;  Surgeon: Aviva Signs, MD;  Location: [facility];  Service: General;  Laterality: N/A;  Pt to arrive at [place] N/A 03/24/2015   Procedure: INSERTION PORT-A-CATH;  Surgeon: Jules Husbands, MD;  Location: [facility];  Service: General;  Laterality: N/A;  . PORTACATH PLACEMENT Left 01/07/2018   Procedure: INSERTION PORT A CATH (ATTACHED CATHETER IN LEFT SUBCLAVIAN);  Surgeon: Aviva Signs, MD;  Location: [facility];  Service: General;  Laterality: Left;     SOCIAL HISTORY:  Social History   Socioeconomic History  . Marital status: Divorced    Spouse name: Not on file  . Number of children: 2  . Years of education: 33  . Highest education level: Not on file  Occupational  History  . Occupation: disabled/unemployed  . Occupation: unemployed/Mediicaid only    Comment: NO INCOME  Tobacco Use  . Smoking status: Former Smoker    Packs/day: 0.10    Years: 30.00    Pack years: 3.00    Types: Cigarettes    Start date: 02/06/1973    Quit date: 12/09/2017    Years since quitting: 1.4  . Smokeless tobacco: Never Used  . Tobacco comment: Quit on 12/09/2017  Substance and Sexual Activity  . Alcohol use: Yes    Alcohol/week: 0.0 standard drinks    Comment: beer occ, history of ETOH abuse in remote past.   . Drug use: No  . Sexual activity: Never    Partners: Female    Birth control/protection: None  Other Topics Concern  . Not on file  Social History Narrative   Single   Lives alone   Turned down for disability   Social Determinants of Health   Financial Resource Strain:   . Difficulty of Paying Living Expenses:   Food Insecurity:   . Worried About Charity fundraiser in the Last Year:   . Arboriculturist in the Last Year:   Transportation Needs:   . Film/video editor (Medical):   Marland Kitchen Lack of Transportation (Non-Medical):   Physical Activity:   . Days of Exercise per Week:   . Minutes of Exercise per Session:   Stress:   . Feeling of Stress :   Social Connections:   . Frequency of Communication with Friends and Family:   . Frequency of Social Gatherings with Friends and Family:   . Attends Religious Services:   . Active Member of Clubs or Organizations:   . Attends Archivist Meetings:   Marland Kitchen Marital Status:   Intimate Partner Violence:   . Fear of Current or Ex-Partner:   . Emotionally Abused:   Marland Kitchen Physically Abused:   . Sexually Abused:     FAMILY HISTORY:  Family History  Problem Relation Age of Onset  . Pulmonary embolism Mother   . Breast cancer Mother   . Cancer Mother        breast cancer  . Arthritis Mother   . Heart disease Father   . Cancer Father 92       Leukemia  . Cancer Paternal Grandfather        Lung  .  Ataxia Neg Hx   . Chorea Neg Hx   . Dementia Neg Hx   . Mental retardation Neg Hx   . Migraines Neg Hx   . Multiple sclerosis Neg Hx   . Neurofibromatosis Neg Hx   . Neuropathy Neg Hx   . Parkinsonism Neg  Hx   . Seizures Neg Hx   . Stroke Neg Hx   . Colon cancer Neg Hx     CURRENT MEDICATIONS:  Outpatient Encounter Medications as of 05/13/2019  Medication Sig Note  . amitriptyline (ELAVIL) 25 MG tablet Take 1 tablet (25 mg total) by mouth at bedtime.   Marland Kitchen atorvastatin (LIPITOR) 20 MG tablet TAKE 1 TABLET BY MOUTH AT BEDTIME (Patient taking differently: Take 20 mg by mouth daily. )   . buPROPion (WELLBUTRIN SR) 150 MG 12 hr tablet Take 150 mg by mouth every morning.    . busPIRone (BUSPAR) 10 MG tablet Take 10 mg by mouth 2 (two) times daily.   . fluorouracil CALGB 07371 in sodium chloride 0.9 % 150 mL Inject 5,650 mg into the vein. Over 46 hours   . FLUoxetine (PROZAC) 20 MG capsule TAKE 1 CAPSULE BY MOUTH 2 TIMES A DAY (Patient taking differently: Take 20 mg by mouth 2 (two) times daily. ) 06/10/2018: 1 tab TID  . gabapentin (NEURONTIN) 300 MG capsule TAKE 1 CAPSULE BY MOUTH THREE TIMES A DAY   . IRINOTECAN HCL IV Inject 420 mg into the vein every 14 (fourteen) days.   Marland Kitchen LEUCOVORIN CALCIUM IV Inject 944 mg into the vein every 14 (fourteen) days.   Marland Kitchen loperamide (IMODIUM) 2 MG capsule TAKE 2 CAPSULES BY MOUTH TWICE DAILY   . nystatin (MYCOSTATIN/NYSTOP) powder Apply 1 application topically 4 (four) times daily.   Marland Kitchen omeprazole (PRILOSEC) 20 MG capsule Take 20 mg by mouth every morning.    . Vitamin D, Ergocalciferol, (DRISDOL) 1.25 MG (50000 UT) CAPS capsule Take 50,000 Units by mouth once a week.   Alveda Reasons 20 MG TABS tablet TAKE 1 TABLET BY MOUTH EVERY DAY WITH SUPPER   . lidocaine-prilocaine (EMLA) cream APPLY A SMALL AMOUNT OVER PORT SITE AND COVER WITH PLASTIC WRAP ONE HOUR PRIOR TO APPOINTMENT (Patient not taking: Reported on 05/13/2019)   . ondansetron (ZOFRAN) 8 MG tablet Take 1  tablet (8 mg total) by mouth 2 (two) times daily as needed for refractory nausea / vomiting. Start on day 3 after chemotherapy. (Patient not taking: Reported on 05/13/2019)   . PROAIR HFA 108 (90 Base) MCG/ACT inhaler INHALE 2 PUFFS BY MOUTH EVERY 6 HOURS AS NEEDED FOR SHORTNESS OF BREATH/WHEEZING. (Patient not taking: Reported on 05/13/2019)   . prochlorperazine (COMPAZINE) 10 MG tablet Take 1 tablet (10 mg total) by mouth every 6 (six) hours as needed (NAUSEA). (Patient not taking: Reported on 05/13/2019)    Facility-Administered Encounter Medications as of 05/13/2019  Medication  . sodium chloride flush (NS) 0.9 % injection 10 mL    ALLERGIES:  No Known Allergies   PHYSICAL EXAM:  ECOG Performance status: 1  Vitals:   05/13/19 0823  BP: 135/87  Pulse: (!) 107  Resp: 18  Temp: (!) 96.9 F (36.1 C)  SpO2: 98%   Filed Weights   05/13/19 0823  Weight: 213 lb (96.6 kg)    Physical Exam Vitals reviewed.  Constitutional:      Appearance: Normal appearance.  Cardiovascular:     Rate and Rhythm: Normal rate and regular rhythm.     Heart sounds: Normal heart sounds.  Pulmonary:     Effort: Pulmonary effort is normal.     Breath sounds: Normal breath sounds.  Abdominal:     General: There is no distension.     Palpations: Abdomen is soft. There is no mass.  Musculoskeletal:  General: No swelling.  Skin:    General: Skin is warm.  Neurological:     General: No focal deficit present.     Mental Status: He is alert and oriented to person, place, and time.  Psychiatric:        Mood and Affect: Mood normal.        Behavior: Behavior normal.      LABORATORY DATA:  I have reviewed the labs as listed.  CBC    Component Value Date/Time   WBC 6.1 05/13/2019 0835   RBC 4.46 05/13/2019 0835   HGB 14.7 05/13/2019 0835   HCT 45.5 05/13/2019 0835   PLT 185 05/13/2019 0835   MCV 102.0 (H) 05/13/2019 0835   MCH 33.0 05/13/2019 0835   MCHC 32.3 05/13/2019 0835   RDW 16.5 (H)  05/13/2019 0835   LYMPHSABS 1.1 05/13/2019 0835   MONOABS 0.8 05/13/2019 0835   EOSABS 0.1 05/13/2019 0835   BASOSABS 0.1 05/13/2019 0835   CMP Latest Ref Rng & Units 05/13/2019 04/28/2019 04/14/2019  Glucose 70 - 99 mg/dL 113(H) 106(H) 113(H)  BUN 8 - 23 mg/dL '8 9 9  ' Creatinine 0.61 - 1.24 mg/dL 0.95 0.94 0.90  Sodium 135 - 145 mmol/L 136 137 138  Potassium 3.5 - 5.1 mmol/L 4.5 4.5 4.5  Chloride 98 - 111 mmol/L 98 99 99  CO2 22 - 32 mmol/L '28 27 30  ' Calcium 8.9 - 10.3 mg/dL 9.4 9.2 9.2  Total Protein 6.5 - 8.1 g/dL 7.2 7.4 6.9  Total Bilirubin 0.3 - 1.2 mg/dL 0.8 0.7 0.5  Alkaline Phos 38 - 126 U/L 115 123 94  AST 15 - 41 U/L '18 16 15  ' ALT 0 - 44 U/L '11 14 12       ' DIAGNOSTIC IMAGING:  I have independently reviewed scans.   I have reviewed Venita Lick LPN's note and agree with the documentation.  I personally performed a face-to-face visit, made revisions and my assessment and plan is as follows.    ASSESSMENT & PLAN:   Malignant neoplasm of transverse colon ([place]) 1.  Metastatic colon cancer, K-ras mutation positive, MSI stable: -Maintenance 5-FU/leucovorin and bevacizumab started on 05/22/2018. -CT CAP reviewed by me on 03/01/2019 showed peritoneal/omental disease which is stable. -Last chemotherapy was on 04/28/2019. -His last CEA was normal.  I have reviewed his LFTs and CBC which were within normal limits. -We will proceed with his treatment today.  We will plan to repeat scans in 4 to 6 months.  2.  Pulmonary embolism: -Incidental PE on chest CT on 05/20/2018. -He is continuing Xarelto without any bleeding issues.  3.  Peripheral neuropathy: -Numbness in the feet has been stable from prior oxaliplatin therapy.  4.  Diarrhea: -He is continuing to average Lomotil 2 to 3 tablets/day.  Diarrhea is well controlled.  5.  Multiple falls: -History of multiple falls since he had a head injury many years ago.  He does not have any warning signs prior to fall. -Brain MRI  from 02/12/2019 did not reveal any major pathology. -He had fracture of the right wrist and medial clavicle from the most recent fall.  He follows up with Dr. Fredna Dow.     Orders placed this encounter:  No orders of the defined types were placed in this encounter.     Derek Jack, MD [place] [phone]

## 2019-05-13 NOTE — Assessment & Plan Note (Addendum)
1.  Metastatic colon cancer, K-ras mutation positive, MSI stable: -Maintenance 5-FU/leucovorin and bevacizumab started on 05/22/2018. -CT CAP reviewed by me on 03/01/2019 showed peritoneal/omental disease which is stable. -Last chemotherapy was on 04/28/2019. -His last CEA was normal.  I have reviewed his LFTs and CBC which were within normal limits. -We will proceed with his treatment today.  We will plan to repeat scans in 4 to 6 months.  2.  Pulmonary embolism: -Incidental PE on chest CT on 05/20/2018. -He is continuing Xarelto without any bleeding issues.  3.  Peripheral neuropathy: -Numbness in the feet has been stable from prior oxaliplatin therapy.  4.  Diarrhea: -He is continuing to average Lomotil 2 to 3 tablets/day.  Diarrhea is well controlled.  5.  Multiple falls: -History of multiple falls since he had a head injury many years ago.  He does not have any warning signs prior to fall. -Brain MRI from 02/12/2019 did not reveal any major pathology. -He had fracture of the right wrist and medial clavicle from the most recent fall.  He follows up with Dr. Fredna Dow.

## 2019-05-13 NOTE — Patient Instructions (Addendum)
[facility] at [facility] Discharge Instructions  You were seen today by Dr. Katragadda. He went over your recent lab results. He will see you back in 2 weeks for labs, treatment and follow up.   Thank you for choosing [facility] at [facility] to provide your oncology and hematology care.  To afford each patient quality time with our provider, please arrive at least 15 minutes before your scheduled appointment time.   If you have a lab appointment with the Cancer Center please come in thru the  Main Entrance and check in at the main information desk  You need to re-schedule your appointment should you arrive 10 or more minutes late.  We strive to give you quality time with our providers, and arriving late affects you and other patients whose appointments are after yours.  Also, if you no show three or more times for appointments you may be dismissed from the clinic at the providers discretion.     Again, thank you for choosing [facility].  Our hope is that these requests will decrease the amount of time that you wait before being seen by our physicians.       _____________________________________________________________  Should you have questions after your visit to [facility], please contact our office at [phone] between the hours of 8:00 a.m. and 4:30 p.m.  Voicemails left after 4:00 p.m. will not be returned until the following business day.  For prescription refill requests, have your pharmacy contact our office and allow 72 hours.    Cancer Center Support Programs:   > Cancer Support Group  2nd Tuesday of the month 1pm-2pm, Journey Room    

## 2019-05-13 NOTE — Progress Notes (Signed)
Patient presents today for treatment and follow up visit with Dr. Delton Coombes. Labs pending. Vital signs within parameters for treatment. Patient has no complaints of any changes since his last visit. MAR reviewed and updated. Patient denies pain today.   Message received from [place] to proceed with treatment. Labs reviewed by Dr. Delton Coombes.   Treatment given today per MD orders. Tolerated infusion without adverse affects. Vital signs stable. 5FU pump infusing per protocol. RUN noted on the screen and verified with patient. No complaints at this time. Discharged from clinic ambulatory. F/U with [facility] as scheduled.

## 2019-05-15 ENCOUNTER — Inpatient Hospital Stay (HOSPITAL_COMMUNITY): Payer: Medicaid Other

## 2019-05-15 ENCOUNTER — Encounter (HOSPITAL_COMMUNITY): Payer: Self-pay

## 2019-05-15 ENCOUNTER — Other Ambulatory Visit: Payer: Self-pay

## 2019-05-15 VITALS — BP 144/83 | HR 101 | Temp 98.2°F | Resp 18

## 2019-05-15 DIAGNOSIS — C188 Malignant neoplasm of overlapping sites of colon: Secondary | ICD-10-CM

## 2019-05-15 DIAGNOSIS — C184 Malignant neoplasm of transverse colon: Secondary | ICD-10-CM

## 2019-05-15 DIAGNOSIS — Z5111 Encounter for antineoplastic chemotherapy: Secondary | ICD-10-CM | POA: Diagnosis not present

## 2019-05-15 MED ORDER — SODIUM CHLORIDE 0.9% FLUSH
10.0000 mL | INTRAVENOUS | Status: DC | PRN
  Administered 2019-05-15: 13:00:00 10 mL

## 2019-05-15 MED ORDER — SODIUM CHLORIDE 0.9 % IV SOLN: INTRAVENOUS | Status: AC

## 2019-05-15 MED ORDER — SODIUM CHLORIDE 0.9 % IV SOLN: INTRAVENOUS | Status: DC

## 2019-05-15 MED ORDER — HEPARIN SOD (PORK) LOCK FLUSH 100 UNIT/ML IV SOLN
500.0000 [IU] | Freq: Once | INTRAVENOUS | Status: AC | PRN
  Administered 2019-05-15: 500 [IU]

## 2019-05-15 NOTE — Patient Instructions (Signed)
[place] at [facility] Discharge Instructions  5FU pump discontinued with IV hydration given over 1 hour. Follow-up as scheduled. Call clinic for any questions or concerns   Thank you for choosing [place] at [facility] to provide your oncology and hematology care.  To afford each patient quality time with our provider, please arrive at least 15 minutes before your scheduled appointment time.   If you have a lab appointment with the [facility] please come in thru the Main Entrance and check in at the main information desk.  You need to re-schedule your appointment should you arrive 10 or more minutes late.  We strive to give you quality time with our providers, and arriving late affects you and other patients whose appointments are after yours.  Also, if you no show three or more times for appointments you may be dismissed from the clinic at the providers discretion.     Again, thank you for choosing [facility].  Our hope is that these requests will decrease the amount of time that you wait before being seen by our physicians.       _____________________________________________________________  Should you have questions after your visit to [facility], please contact our office at [phone] between the hours of 8:00 a.m. and 4:30 p.m.  Voicemails left after 4:00 p.m. will not be returned until the following business day.  For prescription refill requests, have your pharmacy contact our office and allow 72 hours.    Due to Covid, you will need to wear a mask upon entering the hospital. If you do not have a mask, a mask will be given to you at the Main Entrance upon arrival. For doctor visits, patients may have 1 support person with them. For treatment visits, patients can not have anyone with them due to social distancing guidelines and our immunocompromised population.

## 2019-05-15 NOTE — Progress Notes (Signed)
53 Pt to clinic for 5FU discontinuation. Pt walked here from the [employer] and is c/o dizziness and some dyspnea. Pt brought to tx area in a wheelchair. Face,arms and chest appear flushed and pt is sweating. He denies any pain. H/R 135 other vitals stable Reviewed this information with RLockamy NP and orders obtained to give 500 ml NS over 1 hour then re-evaluate pt.                                                                                                     Erik [name] tolerated discontinuation of 5FU pump with portacath flush and IV hydration well without complaints at this time. Pt reports he is feeling better than when he came in and will be riding the [employer] bus home. VSS upon discharge. Pt discharged via wheelchair in stable condition

## 2019-05-21 NOTE — Progress Notes (Signed)

## 2019-05-27 ENCOUNTER — Encounter (HOSPITAL_COMMUNITY): Payer: Self-pay

## 2019-05-27 ENCOUNTER — Encounter (HOSPITAL_COMMUNITY): Payer: Self-pay | Admitting: Hematology

## 2019-05-27 ENCOUNTER — Other Ambulatory Visit: Payer: Self-pay

## 2019-05-27 ENCOUNTER — Inpatient Hospital Stay (HOSPITAL_COMMUNITY): Payer: Medicaid Other

## 2019-05-27 ENCOUNTER — Inpatient Hospital Stay (HOSPITAL_BASED_OUTPATIENT_CLINIC_OR_DEPARTMENT_OTHER): Payer: Medicaid Other | Admitting: Hematology

## 2019-05-27 VITALS — BP 148/78 | HR 89 | Temp 97.7°F | Resp 18

## 2019-05-27 DIAGNOSIS — C188 Malignant neoplasm of overlapping sites of colon: Secondary | ICD-10-CM

## 2019-05-27 DIAGNOSIS — Z5111 Encounter for antineoplastic chemotherapy: Secondary | ICD-10-CM | POA: Diagnosis not present

## 2019-05-27 DIAGNOSIS — C184 Malignant neoplasm of transverse colon: Secondary | ICD-10-CM

## 2019-05-27 LAB — COMPREHENSIVE METABOLIC PANEL
ALT: 17 U/L (ref 0–44)
AST: 16 U/L (ref 15–41)
Albumin: 3.3 g/dL — ABNORMAL LOW (ref 3.5–5.0)
Alkaline Phosphatase: 134 U/L — ABNORMAL HIGH (ref 38–126)
Anion gap: 12 (ref 5–15)
BUN: 7 mg/dL — ABNORMAL LOW (ref 8–23)
CO2: 28 mmol/L (ref 22–32)
Calcium: 8.8 mg/dL — ABNORMAL LOW (ref 8.9–10.3)
Chloride: 95 mmol/L — ABNORMAL LOW (ref 98–111)
Creatinine, Ser: 0.77 mg/dL (ref 0.61–1.24)
GFR calc Af Amer: >60 mL/min (ref >60)
GFR calc non Af Amer: >60 mL/min (ref >60)
Glucose, Bld: 124 mg/dL — ABNORMAL HIGH (ref 70–99)
Potassium: 3.9 mmol/L (ref 3.5–5.1)
Sodium: 135 mmol/L (ref 135–145)
Total Bilirubin: 0.8 mg/dL (ref 0.3–1.2)
Total Protein: 6.9 g/dL (ref 6.5–8.1)

## 2019-05-27 LAB — CBC WITH DIFFERENTIAL/PLATELET
Abs Immature Granulocytes: 0.03 10*3/uL (ref 0.00–0.07)
Basophils Absolute: 0.1 10*3/uL (ref 0.0–0.1)
Basophils Relative: 1 %
Eosinophils Absolute: 0.1 10*3/uL (ref 0.0–0.5)
Eosinophils Relative: 2 %
HCT: 41.6 % (ref 39.0–52.0)
Hemoglobin: 13.4 g/dL (ref 13.0–17.0)
Immature Granulocytes: 1 %
Lymphocytes Relative: 23 %
Lymphs Abs: 1.1 10*3/uL (ref 0.7–4.0)
MCH: 32.5 pg (ref 26.0–34.0)
MCHC: 32.2 g/dL (ref 30.0–36.0)
MCV: 101 fL — ABNORMAL HIGH (ref 80.0–100.0)
Monocytes Absolute: 0.6 10*3/uL (ref 0.1–1.0)
Monocytes Relative: 13 %
Neutro Abs: 3 10*3/uL (ref 1.7–7.7)
Neutrophils Relative %: 60 %
Platelets: 142 10*3/uL — ABNORMAL LOW (ref 150–400)
RBC: 4.12 MIL/uL — ABNORMAL LOW (ref 4.22–5.81)
RDW: 15.9 % — ABNORMAL HIGH (ref 11.5–15.5)
WBC: 5 10*3/uL (ref 4.0–10.5)
nRBC: 0 % (ref 0.0–0.2)

## 2019-05-27 LAB — MAGNESIUM: Magnesium: 2 mg/dL (ref 1.7–2.4)

## 2019-05-27 MED ORDER — ATROPINE SULFATE 1 MG/ML IJ SOLN
0.5000 mg | Freq: Once | INTRAMUSCULAR | Status: AC
  Administered 2019-05-27: 10:00:00 0.5 mg via INTRAVENOUS

## 2019-05-27 MED ORDER — SODIUM CHLORIDE 0.9 % IV SOLN: Freq: Once | INTRAVENOUS | Status: DC

## 2019-05-27 MED ORDER — ATROPINE SULFATE 1 MG/ML IJ SOLN
INTRAMUSCULAR | Status: AC
  Filled 2019-05-27: qty 1

## 2019-05-27 MED ORDER — PALONOSETRON HCL INJECTION 0.25 MG/5ML
0.2500 mg | Freq: Once | INTRAVENOUS | Status: AC
  Administered 2019-05-27: 10:00:00 0.25 mg via INTRAVENOUS

## 2019-05-27 MED ORDER — SODIUM CHLORIDE 0.9 % IV SOLN
5.0000 mg/kg | Freq: Once | INTRAVENOUS | Status: AC
  Administered 2019-05-27: 11:00:00 500 mg via INTRAVENOUS
  Filled 2019-05-27: qty 4

## 2019-05-27 MED ORDER — SODIUM CHLORIDE 0.9 % IV SOLN
10.0000 mg | Freq: Once | INTRAVENOUS | Status: AC
  Administered 2019-05-27: 10 mg via INTRAVENOUS
  Filled 2019-05-27: qty 10

## 2019-05-27 MED ORDER — SODIUM CHLORIDE 0.9 % IV SOLN
2400.0000 mg/m2 | INTRAVENOUS | Status: DC
  Administered 2019-05-27: 12:00:00 5650 mg via INTRAVENOUS
  Filled 2019-05-27: qty 113

## 2019-05-27 MED ORDER — SODIUM CHLORIDE 0.9 % IV SOLN
900.0000 mg | Freq: Once | INTRAVENOUS | Status: AC
  Administered 2019-05-27: 11:00:00 900 mg via INTRAVENOUS
  Filled 2019-05-27: qty 45

## 2019-05-27 MED ORDER — PALONOSETRON HCL INJECTION 0.25 MG/5ML
INTRAVENOUS | Status: AC
  Filled 2019-05-27: qty 5

## 2019-05-27 MED ORDER — SODIUM CHLORIDE 0.9 % IV SOLN: Freq: Once | INTRAVENOUS | Status: AC

## 2019-05-27 MED ORDER — FLUOROURACIL CHEMO INJECTION 2.5 GM/50ML
400.0000 mg/m2 | Freq: Once | INTRAVENOUS | Status: AC
  Administered 2019-05-27: 12:00:00 950 mg via INTRAVENOUS
  Filled 2019-05-27: qty 19

## 2019-05-27 NOTE — Assessment & Plan Note (Signed)
1.  Metastatic colon cancer, K-ras mutation positive, MSI stable: -Maintenance 5-FU/leucovorin and bevacizumab started on 05/22/2018. -CT CAP reviewed by me on 03/01/2019 showed peritoneal/omental disease which is stable. -Last chemotherapy was on 05/13/2019.  He reported minor nosebleeds. -CEA was 11.6 on 04/28/2019.  This is slightly above his baseline. -I reviewed his LFTs which are normal today.  White count is 5 and platelet count is 142.  He will proceed with his next treatment today.  We will see him back in 2 weeks for follow-up.  I plan to repeat scans in 4 to 6 months.  2.  Pulmonary embolism: -Incidental PE on CT on 05/20/2018.  He is continuing Xarelto without any bleeding issues.  3.  Peripheral neuropathy: -Numbness in the feet has been stable from prior oxaliplatin therapy.  4.  Diarrhea: -He is averaging Lomotil 2 to 3 tablets/day.  Diarrhea is well controlled.  5.  Multiple falls: -History of multiple falls since he had a head injury many years ago.  He does not have any warning signs prior to fall. -Brain MRI on 02/12/2019 did not reveal any major pathology. -His fracture of the right wrist has healed.

## 2019-05-27 NOTE — Progress Notes (Signed)
[facility], [place] [address]   CLINIC:  Medical Oncology/Hematology  PCP:  Alanson Puls, [facility] [place] [address] [phone]   REASON FOR VISIT:  Follow-up for metastatic colon cancer   BRIEF ONCOLOGIC HISTORY:  Oncology History Overview Note  Stage IIIC Erik [name]) adenocarcinoma of transverse colon, diagnosed on colonoscopy by Dr. Oneida Alar on 01/12/2015 followed by definitive surgery by Dr. [name] with right hemicolectomy on 02/12/2015.  He then underwent FOLFOX x 12 cycles in the adjuvant setting (03/30/2015- 09/06/2015).   Malignant neoplasm of transverse colon ([place])  01/12/2015 Pathologic Stage   Colon, biopsy, distal transverse - TUBULOVILLOUS ADENOMA WITH HIGH GRADE DYSPLASIA.   01/12/2015 Procedure   Colonoscopy by Dr. Oneida Alar.   01/12/2015 Tumor Marker   CEA: 6.6 (H)    01/18/2015 Imaging   CT abd/pelvis- Apple-core lesion identified in the mid transverse colon without obstruction. No evidence for lymphadenopathy in the gastrohepatic ligament or omentum.  Stable 8 mm hypo attenuating lesion in the left liver, likely a cyst.   02/10/2015 Initial Diagnosis   Adenocarcinoma of transverse colon ([place])   02/12/2015 Definitive Surgery   [name], Extended right hemicolectomy    02/12/2015 Pathology Results   Mucinous adenocarcinoma with penetration of visceral peritoneum, 4/19 lymph nodes for metastatic disease, negative resection margins, with LVI and perineural invasion   03/30/2015 - 09/06/2015 Chemotherapy   FOLFOX x 12 cycles   05/25/2015 Treatment Plan Change   5 FU bolus discontinued for cycle #5   06/08/2015 Treatment Plan Change   Treatment deferred x 1 week   06/15/2015 Treatment Plan Change   5FU CI decreased by 10% and Oxaliplatin reduced by 15% for cycles #6-#11; Oxaliplatin dropped for cycle #12 d/t neuropathy.    10/20/2015 Imaging   CT CAP- Right hemicolectomy without evidence of  metastatic disease. 2. Previously measured ground-glass lesion in the left upper lobe has resolved. 3. Probable food debris in the stomach, simulating gastric wall thickening. Please correlate clinically. 4. 6 mm irregular nodular density in the left upper lobe, stable. Continued attention on followup exams is warranted.   01/25/2016 Procedure   Colonoscopy by Dr. Oneida Alar- Non-thrombosed external hemorrhoids found on digital rectal exam. - One 4 mm polyp in the rectum, removed with a cold biopsy forceps. Resected and retrieved. - Congested mucosa in the neo-terminal ileum. Biopsied. - Redundant colon. - Internal hemorrhoids.   01/26/2016 Pathology Results   1. Terminal ileum, biopsy - MILD ACUTE (ACTIVE) ILEITIS. - NO DYSPLASIA OR MALIGNANCY IDENTIFIED - SEE COMMENT. 2. Rectum, polyp(s) - HYPERPLASTIC POLYP (X 1). - NO DYSPLASIA OR MALIGNANCY IDENTIFIED.   04/13/2016 Imaging   CT chest- Stable CT chest. 6 mm irregular nodule anterior left upper lobe is stable. The scattered areas of peribronchovascular micro nodularity in the lungs bilaterally are unchanged.   10/19/2016 Imaging   CT CAP: 1. Status post right hemicolectomy. No findings to suggest metastatic disease in the abdomen or pelvis. 2. Aortic atherosclerosis. 3. Additional incidental findings, as above. Aortic Atherosclerosis (ICD10-I70.0).   01/09/2018 -  Chemotherapy   The patient had palonosetron (ALOXI) injection 0.25 mg, 0.25 mg, Intravenous,  Once, 34 of 36 cycles Administration: 0.25 mg (01/09/2018), 0.25 mg (01/23/2018), 0.25 mg (02/11/2018), 0.25 mg (02/25/2018), 0.25 mg (03/12/2018), 0.25 mg (03/27/2018), 0.25 mg (04/10/2018), 0.25 mg (04/24/2018), 0.25 mg (05/08/2018), 0.25 mg (05/22/2018), 0.25 mg (06/10/2018), 0.25 mg (06/26/2018), 0.25 mg (07/10/2018), 0.25 mg (07/24/2018), 0.25 mg (08/13/2018), 0.25 mg (08/27/2018),  0.25 mg (09/10/2018), 0.25 mg (09/24/2018), 0.25 mg (10/08/2018), 0.25 mg (11/05/2018), 0.25 mg (11/19/2018), 0.25 mg  (12/04/2018), 0.25 mg (12/18/2018), 0.25 mg (01/08/2019), 0.25 mg (01/22/2019), 0.25 mg (02/04/2019), 0.25 mg (02/18/2019), 0.25 mg (03/04/2019), 0.25 mg (03/18/2019), 0.25 mg (04/01/2019), 0.25 mg (04/14/2019), 0.25 mg (04/28/2019), 0.25 mg (05/13/2019), 0.25 mg (05/27/2019) irinotecan (CAMPTOSAR) 420 mg in dextrose 5 % 500 mL chemo infusion, 180 mg/m2 = 420 mg, Intravenous,  Once, 9 of 9 cycles Administration: 420 mg (01/09/2018), 420 mg (01/23/2018), 420 mg (02/11/2018), 420 mg (02/25/2018), 420 mg (03/12/2018), 420 mg (03/27/2018), 420 mg (04/10/2018), 420 mg (04/24/2018), 420 mg (05/08/2018) leucovorin 900 mg in dextrose 5 % 250 mL infusion, 944 mg, Intravenous,  Once, 34 of 36 cycles Administration: 900 mg (01/09/2018), 900 mg (01/23/2018), 900 mg (02/11/2018), 900 mg (02/25/2018), 900 mg (03/12/2018), 900 mg (03/27/2018), 900 mg (04/10/2018), 900 mg (04/24/2018), 900 mg (05/08/2018), 900 mg (06/10/2018), 900 mg (06/26/2018), 900 mg (07/10/2018), 900 mg (07/24/2018), 900 mg (08/13/2018), 900 mg (08/27/2018), 900 mg (09/10/2018), 900 mg (09/24/2018), 900 mg (10/08/2018), 900 mg (11/05/2018), 900 mg (11/19/2018), 900 mg (12/04/2018), 900 mg (12/18/2018), 900 mg (01/08/2019), 900 mg (01/22/2019), 900 mg (02/04/2019), 900 mg (02/18/2019), 900 mg (03/04/2019), 900 mg (03/18/2019), 900 mg (04/01/2019), 900 mg (04/14/2019), 900 mg (04/28/2019), 900 mg (05/13/2019), 900 mg (05/27/2019) fluorouracil (ADRUCIL) chemo injection 950 mg, 400 mg/m2 = 950 mg, Intravenous,  Once, 34 of 36 cycles Administration: 950 mg (01/09/2018), 950 mg (01/23/2018), 950 mg (02/11/2018), 950 mg (02/25/2018), 950 mg (03/12/2018), 950 mg (03/27/2018), 950 mg (04/10/2018), 950 mg (04/24/2018), 950 mg (05/08/2018), 950 mg (05/22/2018), 950 mg (06/10/2018), 950 mg (06/26/2018), 950 mg (07/10/2018), 950 mg (07/24/2018), 950 mg (08/13/2018), 950 mg (08/27/2018), 950 mg (09/10/2018), 950 mg (09/24/2018), 950 mg (10/08/2018), 950 mg (11/05/2018), 950 mg (11/19/2018), 950 mg (12/04/2018), 950 mg (12/18/2018), 950 mg (01/08/2019), 950  mg (01/22/2019), 950 mg (02/04/2019), 950 mg (02/18/2019), 950 mg (03/04/2019), 950 mg (03/18/2019), 950 mg (04/01/2019), 950 mg (04/14/2019), 950 mg (04/28/2019), 950 mg (05/13/2019), 950 mg (05/27/2019) fluorouracil (ADRUCIL) 5,650 mg in sodium chloride 0.9 % 137 mL chemo infusion, 2,400 mg/m2 = 5,650 mg, Intravenous, 1 Day/Dose, 34 of 36 cycles Administration: 5,650 mg (01/09/2018), 5,650 mg (01/23/2018), 5,650 mg (02/11/2018), 5,650 mg (02/25/2018), 5,650 mg (03/12/2018), 5,650 mg (03/27/2018), 5,650 mg (04/10/2018), 5,650 mg (04/24/2018), 5,650 mg (05/08/2018), 5,650 mg (05/22/2018), 5,650 mg (06/10/2018), 5,650 mg (06/26/2018), 5,650 mg (07/10/2018), 5,650 mg (07/24/2018), 5,650 mg (08/13/2018), 5,650 mg (08/27/2018), 5,650 mg (09/10/2018), 5,650 mg (09/24/2018), 5,650 mg (10/08/2018), 5,650 mg (11/05/2018), 5,650 mg (11/19/2018), 5,650 mg (12/04/2018), 5,650 mg (12/18/2018), 5,650 mg (01/08/2019), 5,650 mg (01/22/2019), 5,650 mg (02/04/2019), 5,650 mg (02/18/2019), 5,650 mg (03/04/2019), 5,650 mg (03/18/2019), 5,650 mg (04/01/2019), 5,650 mg (04/14/2019), 5,650 mg (04/28/2019), 5,650 mg (05/13/2019), 5,650 mg (05/27/2019) bevacizumab-awwb (MVASI) 500 mg in sodium chloride 0.9 % 100 mL chemo infusion, 4.7 mg/kg = 525 mg (100 % of original dose 5 mg/kg), Intravenous,  Once, 1 of 1 cycle Dose modification: 5 mg/kg (original dose 5 mg/kg, Cycle 24) Administration: 500 mg (01/08/2019) bevacizumab-bvzr (ZIRABEV) 500 mg in sodium chloride 0.9 % 100 mL chemo infusion, 5 mg/kg = 500 mg (100 % of original dose 5 mg/kg), Intravenous,  Once, 13 of 15 cycles Dose modification: 5 mg/kg (original dose 5 mg/kg, Cycle 21) Administration: 500 mg (11/19/2018), 500 mg (12/04/2018), 500 mg (12/18/2018), 500 mg (01/22/2019), 500 mg (02/04/2019), 500 mg (02/18/2019), 500 mg (03/04/2019), 500 mg (03/18/2019), 500 mg (04/01/2019), 500 mg (04/14/2019), 500 mg (04/28/2019), 500 mg (  05/13/2019), 500 mg (05/27/2019)  for chemotherapy treatment.    01/23/2018 Cancer Staging   Staging  form: Colon and Rectum, AJCC 7th Edition - Pathologic: M1 - Signed by Zoila Shutter, MD on 01/23/2018   Malignant neoplasm of overlapping sites of colon ([place])  12/26/2017 Initial Diagnosis   Cancer of overlapping sites of colon metastatic to intra-abdominal lymph node ([place])   01/09/2018 -  Chemotherapy   The patient had palonosetron (ALOXI) injection 0.25 mg, 0.25 mg, Intravenous,  Once, 13 of 14 cycles Administration: 0.25 mg (01/09/2018), 0.25 mg (01/23/2018), 0.25 mg (02/11/2018), 0.25 mg (02/25/2018), 0.25 mg (03/12/2018), 0.25 mg (03/27/2018), 0.25 mg (04/10/2018), 0.25 mg (04/24/2018), 0.25 mg (05/08/2018), 0.25 mg (05/22/2018), 0.25 mg (06/10/2018), 0.25 mg (06/26/2018), 0.25 mg (07/10/2018) irinotecan (CAMPTOSAR) 420 mg in dextrose 5 % 500 mL chemo infusion, 180 mg/m2 = 420 mg, Intravenous,  Once, 9 of 9 cycles Administration: 420 mg (01/09/2018), 420 mg (01/23/2018), 420 mg (02/11/2018), 420 mg (02/25/2018), 420 mg (03/12/2018), 420 mg (03/27/2018), 420 mg (04/10/2018), 420 mg (04/24/2018), 420 mg (05/08/2018) leucovorin 900 mg in dextrose 5 % 250 mL infusion, 944 mg, Intravenous,  Once, 13 of 14 cycles Administration: 900 mg (01/09/2018), 900 mg (01/23/2018), 900 mg (02/11/2018), 900 mg (02/25/2018), 900 mg (03/12/2018), 900 mg (03/27/2018), 900 mg (04/10/2018), 900 mg (04/24/2018), 900 mg (05/08/2018), 900 mg (06/10/2018), 900 mg (06/26/2018), 900 mg (07/10/2018) fluorouracil (ADRUCIL) chemo injection 950 mg, 400 mg/m2 = 950 mg, Intravenous,  Once, 13 of 14 cycles Administration: 950 mg (01/09/2018), 950 mg (01/23/2018), 950 mg (02/11/2018), 950 mg (02/25/2018), 950 mg (03/12/2018), 950 mg (03/27/2018), 950 mg (04/10/2018), 950 mg (04/24/2018), 950 mg (05/08/2018), 950 mg (05/22/2018), 950 mg (06/10/2018), 950 mg (06/26/2018), 950 mg (07/10/2018) fluorouracil (ADRUCIL) 5,650 mg in sodium chloride 0.9 % 137 mL chemo infusion, 2,400 mg/m2 = 5,650 mg, Intravenous, 1 Day/Dose, 13 of 14 cycles Administration: 5,650 mg (01/09/2018), 5,650 mg (01/23/2018), 5,650  mg (02/11/2018), 5,650 mg (02/25/2018), 5,650 mg (03/12/2018), 5,650 mg (03/27/2018), 5,650 mg (04/10/2018), 5,650 mg (04/24/2018), 5,650 mg (05/08/2018), 5,650 mg (05/22/2018), 5,650 mg (06/10/2018), 5,650 mg (06/26/2018), 5,650 mg (07/10/2018)  for chemotherapy treatment.    Colon cancer metastasized to multiple sites [facility])  01/16/2018 Initial Diagnosis   Colon cancer metastasized to multiple sites [facility])   01/23/2018 - 02/10/2018 Chemotherapy   The patient had panitumumab (VECTIBIX) 600 mg in sodium chloride 0.9 % 100 mL chemo infusion, 640 mg, Intravenous,  Once, 1 of 3 cycles Administration: 600 mg (01/23/2018)  for chemotherapy treatment.    03/12/2018 - 10/21/2018 Chemotherapy   The patient had bevacizumab (AVASTIN) 500 mg in sodium chloride 0.9 % 100 mL chemo infusion, 525 mg, Intravenous,  Once, 15 of 22 cycles Administration: 500 mg (03/12/2018), 500 mg (03/27/2018), 500 mg (04/10/2018), 500 mg (04/24/2018), 500 mg (06/10/2018), 500 mg (05/08/2018), 500 mg (05/22/2018), 500 mg (06/26/2018), 500 mg (07/10/2018), 500 mg (07/24/2018), 500 mg (08/13/2018), 500 mg (08/27/2018), 500 mg (09/10/2018), 500 mg (09/24/2018), 500 mg (10/08/2018)  for chemotherapy treatment.       CANCER STAGING: Cancer Staging Malignant neoplasm of transverse colon [facility]) Staging form: Colon and Rectum, AJCC 7th Edition - Pathologic stage from 03/04/2015: Stage IIIC (T4a, N2a, cM0) - Signed by Baird Cancer, PA-C on 03/04/2015 - Pathologic: M1 - Signed by Zoila Shutter, MD on 01/23/2018    INTERVAL HISTORY:  Erik [name] 64 y.o. male seen for follow-up of metastatic colon cancer and toxicity assessment prior to next cycle of chemotherapy.  Appetite and energy levels are  reported as 50%.  Reports blood-tinged mucus when he blows his nose.  No blood dripping out of the nose reported.  He has mild fatigue.  Diarrhea is well controlled with Lomotil.  Denies any nausea or vomiting.  Numbness in the feet has been stable.  Denies any falls in the last 2  weeks.  REVIEW OF SYSTEMS:  Review of Systems  HENT:   Positive for nosebleeds.   Gastrointestinal: Positive for diarrhea.  Neurological: Positive for numbness.  All other systems reviewed and are negative.    PAST MEDICAL/SURGICAL HISTORY:  Past Medical History:  Diagnosis Date  . Abnormal stress echocardiogram   . Adenocarcinoma of transverse colon ([place]) 02/10/2015  . Alcohol abuse    Heavy Use up until 2010  . Anxiety   . Blood transfusion without reported diagnosis   . Cancer of overlapping sites of colon metastatic to intra-abdominal lymph node ([place]) 12/26/2017  . COPD (chronic obstructive pulmonary disease) ([place])   . Depression   . Emphysema of lung ([place])   . GERD (gastroesophageal reflux disease)   . Head trauma 2001   closed head injury; coma for 4 weeks  . Hypercholesterolemia   . Hypertension   . ING HERN W/GANGREN RECUR UNILAT/UNSPEC ING HERN 04/21/2009   Qualifier: Diagnosis of  By: Verl Blalock, MD, Delanna Ahmadi PTSD (post-traumatic stress disorder)   . Pulmonary nodule 06/14/2016  . SAH (subarachnoid hemorrhage) ([place]) 08/31/2012  . SDH (subdural hematoma) ([place]) 08/31/2012   Past Surgical History:  Procedure Laterality Date  . BIOPSY  01/25/2016   Procedure: BIOPSY;  Surgeon: Danie Binder, MD;  [facility];  Service: Endoscopy;;  ileum;   . cardiac cath    . COLONOSCOPY  2011   Dr. Oneida Alar: multiple adenomas and hyperplastic polyps  . COLONOSCOPY WITH PROPOFOL N/A 01/12/2015   Procedure: COLONOSCOPY WITH PROPOFOL;  Surgeon: Danie Binder, MD;  [facility];  Service: Endoscopy;  Laterality: N/A;  1030  . COLONOSCOPY WITH PROPOFOL N/A 01/25/2016   Procedure: COLONOSCOPY WITH PROPOFOL;  Surgeon: Danie Binder, MD;  [facility];  Service: Endoscopy;  Laterality: N/A;  1230  . CRANIOTOMY  2001  . ESOPHAGOGASTRODUODENOSCOPY (EGD) WITH PROPOFOL N/A 01/12/2015   Procedure: ESOPHAGOGASTRODUODENOSCOPY (EGD) WITH PROPOFOL;  Surgeon: Danie Binder, MD;  [facility];  Service: Endoscopy;  Laterality: N/A;  . head injury surgery    . HERNIA REPAIR Right 2012   Inguinal- [place] Na  . KIDNEY SURGERY     >30 years ago  . LAPAROSCOPIC RIGHT HEMI COLECTOMY Left 02/10/2015   Procedure: LAPAROSCOPIC THEN OPEN RIGHT HEMI COLECTOMY;  Surgeon: [name], MD;  Location: [facility];  Service: General;  Laterality: Left;  . LAPAROTOMY N/A 12/19/2017   Procedure: EXPLORATORY LAPAROTOMY;  Surgeon: Aviva Signs, MD;  Location: [facility];  Service: General;  Laterality: N/A;  . OPEN REDUCTION INTERNAL FIXATION (ORIF) DISTAL RADIAL FRACTURE Right 10/18/2018   Procedure: OPEN REDUCTION INTERNAL FIXATION (ORIF) RIGHT DISTAL RADIAL FRACTURE;  Surgeon: [name], MD;  Location: [place];  Service: Orthopedics;  Laterality: Right;  block in preop  . OPEN REDUCTION INTERNAL FIXATION (ORIF) DISTAL RADIAL FRACTURE Right 12/20/2018   Procedure: OPEN REDUCTION INTERNAL FIXATION (ORIF) DISTAL RADIUS AND ULNA FRACTURE;  Surgeon: [name], MD;  Location: [place];  Service: Orthopedics;  Laterality: Right;  block  . POLYPECTOMY  01/25/2016   Procedure: POLYPECTOMY;  Surgeon: Marga Melnick  Fields, MD;  [facility];  Service: Endoscopy;;  colon  . PORT-A-CATH REMOVAL N/A 02/26/2017   Procedure: MINOR REMOVAL PORT-A-CATH;  Surgeon: Aviva Signs, MD;  Location[facility];  Service: General;  Laterality: N/A;  Pt to arrive at [place] N/A 03/24/2015   Procedure: INSERTION PORT-A-CATH;  Surgeon: Jules Husbands, MD;  Location: [facility];  Service: General;  Laterality: N/A;  . PORTACATH PLACEMENT Left 01/07/2018   Procedure: INSERTION PORT A CATH (ATTACHED CATHETER IN LEFT SUBCLAVIAN);  Surgeon: Aviva Signs, MD;  Location[facility];  Service: General;  Laterality: Left;     SOCIAL HISTORY:  Social History   Socioeconomic History  . Marital status: Divorced    Spouse name: Not on file  .  Number of children: 2  . Years of education: 64  . Highest education level: Not on file  Occupational History  . Occupation: disabled/unemployed  . Occupation: unemployed/Mediicaid only    Comment: NO INCOME  Tobacco Use  . Smoking status: Former Smoker    Packs/day: 0.10    Years: 30.00    Pack years: 3.00    Types: Cigarettes    Start date: 02/06/1973    Quit date: 12/09/2017    Years since quitting: 1.4  . Smokeless tobacco: Never Used  . Tobacco comment: Quit on 12/09/2017  Substance and Sexual Activity  . Alcohol use: Yes    Alcohol/week: 0.0 standard drinks    Comment: beer occ, history of ETOH abuse in remote past.   . Drug use: No  . Sexual activity: Never    Partners: Female    Birth control/protection: None  Other Topics Concern  . Not on file  Social History Narrative   Single   Lives alone   Turned down for disability   Social Determinants of Health   Financial Resource Strain:   . Difficulty of Paying Living Expenses:   Food Insecurity:   . Worried About Charity fundraiser in the Last Year:   . Arboriculturist in the Last Year:   Transportation Needs:   . Film/video editor (Medical):   Marland Kitchen Lack of Transportation (Non-Medical):   Physical Activity:   . Days of Exercise per Week:   . Minutes of Exercise per Session:   Stress:   . Feeling of Stress :   Social Connections:   . Frequency of Communication with Friends and Family:   . Frequency of Social Gatherings with Friends and Family:   . Attends Religious Services:   . Active Member of Clubs or Organizations:   . Attends Archivist Meetings:   Marland Kitchen Marital Status:   Intimate Partner Violence:   . Fear of Current or Ex-Partner:   . Emotionally Abused:   Marland Kitchen Physically Abused:   . Sexually Abused:     FAMILY HISTORY:  Family History  Problem Relation Age of Onset  . Pulmonary embolism Mother   . Breast cancer Mother   . Cancer Mother        breast cancer  . Arthritis Mother   .  Heart disease Father   . Cancer Father 57       Leukemia  . Cancer Paternal Grandfather        Lung  . Ataxia Neg Hx   . Chorea Neg Hx   . Dementia Neg Hx   . Mental retardation Neg Hx   . Migraines Neg Hx   . Multiple sclerosis Neg Hx   .  Neurofibromatosis Neg Hx   . Neuropathy Neg Hx   . Parkinsonism Neg Hx   . Seizures Neg Hx   . Stroke Neg Hx   . Colon cancer Neg Hx     CURRENT MEDICATIONS:  Outpatient Encounter Medications as of 05/27/2019  Medication Sig Note  . amitriptyline (ELAVIL) 25 MG tablet Take 1 tablet (25 mg total) by mouth at bedtime.   Marland Kitchen atorvastatin (LIPITOR) 20 MG tablet TAKE 1 TABLET BY MOUTH AT BEDTIME (Patient taking differently: Take 20 mg by mouth daily. )   . buPROPion (WELLBUTRIN SR) 150 MG 12 hr tablet Take 150 mg by mouth every morning.    . busPIRone (BUSPAR) 10 MG tablet Take 10 mg by mouth 2 (two) times daily.   . fluorouracil CALGB 97673 in sodium chloride 0.9 % 150 mL Inject 5,650 mg into the vein. Over 46 hours   . FLUoxetine (PROZAC) 20 MG capsule TAKE 1 CAPSULE BY MOUTH 2 TIMES A DAY (Patient taking differently: Take 20 mg by mouth 2 (two) times daily. ) 06/10/2018: 1 tab TID  . gabapentin (NEURONTIN) 300 MG capsule TAKE 1 CAPSULE BY MOUTH THREE TIMES A DAY   . IRINOTECAN HCL IV Inject 420 mg into the vein every 14 (fourteen) days.   Marland Kitchen LEUCOVORIN CALCIUM IV Inject 944 mg into the vein every 14 (fourteen) days.   Marland Kitchen lidocaine-prilocaine (EMLA) cream APPLY A SMALL AMOUNT OVER PORT SITE AND COVER WITH PLASTIC WRAP ONE HOUR PRIOR TO APPOINTMENT   . loperamide (IMODIUM) 2 MG capsule TAKE 2 CAPSULES BY MOUTH TWICE DAILY   . nystatin (MYCOSTATIN/NYSTOP) powder Apply 1 application topically 4 (four) times daily.   Marland Kitchen omeprazole (PRILOSEC) 20 MG capsule Take 20 mg by mouth every morning.    . ondansetron (ZOFRAN) 8 MG tablet Take 1 tablet (8 mg total) by mouth 2 (two) times daily as needed for refractory nausea / vomiting. Start on day 3 after chemotherapy.     Marland Kitchen PROAIR HFA 108 (90 Base) MCG/ACT inhaler INHALE 2 PUFFS BY MOUTH EVERY 6 HOURS AS NEEDED FOR SHORTNESS OF BREATH/WHEEZING.   Marland Kitchen prochlorperazine (COMPAZINE) 10 MG tablet Take 1 tablet (10 mg total) by mouth every 6 (six) hours as needed (NAUSEA).   . Vitamin D, Ergocalciferol, (DRISDOL) 1.25 MG (50000 UT) CAPS capsule Take 50,000 Units by mouth once a week.   Alveda Reasons 20 MG TABS tablet TAKE 1 TABLET BY MOUTH EVERY DAY WITH SUPPER    Facility-Administered Encounter Medications as of 05/27/2019  Medication  . sodium chloride flush (NS) 0.9 % injection 10 mL    ALLERGIES:  No Known Allergies   PHYSICAL EXAM:  ECOG Performance status: 1  Vitals:   05/27/19 0818  BP: 138/78  Pulse: (!) 109  Resp: 18  Temp: (!) 97.1 F (36.2 C)  SpO2: 98%   Filed Weights   05/27/19 0818  Weight: 212 lb 9.6 oz (96.4 kg)    Physical Exam Vitals reviewed.  Constitutional:      Appearance: Normal appearance.  Cardiovascular:     Rate and Rhythm: Normal rate and regular rhythm.     Heart sounds: Normal heart sounds.  Pulmonary:     Effort: Pulmonary effort is normal.     Breath sounds: Normal breath sounds.  Abdominal:     General: There is no distension.     Palpations: Abdomen is soft. There is no mass.  Musculoskeletal:        General: No swelling.  Skin:  General: Skin is warm.  Neurological:     General: No focal deficit present.     Mental Status: He is alert and oriented to person, place, and time.  Psychiatric:        Mood and Affect: Mood normal.        Behavior: Behavior normal.      LABORATORY DATA:  I have reviewed the labs as listed.  CBC    Component Value Date/Time   WBC 5.0 05/27/2019 0835   RBC 4.12 (L) 05/27/2019 0835   HGB 13.4 05/27/2019 0835   HCT 41.6 05/27/2019 0835   PLT 142 (L) 05/27/2019 0835   MCV 101.0 (H) 05/27/2019 0835   MCH 32.5 05/27/2019 0835   MCHC 32.2 05/27/2019 0835   RDW 15.9 (H) 05/27/2019 0835   LYMPHSABS 1.1 05/27/2019 0835    MONOABS 0.6 05/27/2019 0835   EOSABS 0.1 05/27/2019 0835   BASOSABS 0.1 05/27/2019 0835   CMP Latest Ref Rng & Units 05/27/2019 05/13/2019 04/28/2019  Glucose 70 - 99 mg/dL 124(H) 113(H) 106(H)  BUN 8 - 23 mg/dL 7(L) 8 9  Creatinine 0.61 - 1.24 mg/dL 0.77 0.95 0.94  Sodium 135 - 145 mmol/L 135 136 137  Potassium 3.5 - 5.1 mmol/L 3.9 4.5 4.5  Chloride 98 - 111 mmol/L 95(L) 98 99  CO2 22 - 32 mmol/L '28 28 27  ' Calcium 8.9 - 10.3 mg/dL 8.8(L) 9.4 9.2  Total Protein 6.5 - 8.1 g/dL 6.9 7.2 7.4  Total Bilirubin 0.3 - 1.2 mg/dL 0.8 0.8 0.7  Alkaline Phos 38 - 126 U/L 134(H) 115 123  AST 15 - 41 U/L '16 18 16  ' ALT 0 - 44 U/L '17 11 14       ' DIAGNOSTIC IMAGING:  I have reviewed his scans.   I have reviewed Venita Lick LPN's note and agree with the documentation.  I personally performed a face-to-face visit, made revisions and my assessment and plan is as follows.    ASSESSMENT & PLAN:   Malignant neoplasm of transverse colon ([place]) 1.  Metastatic colon cancer, K-ras mutation positive, MSI stable: -Maintenance 5-FU/leucovorin and bevacizumab started on 05/22/2018. -CT CAP reviewed by me on 03/01/2019 showed peritoneal/omental disease which is stable. -Last chemotherapy was on 05/13/2019.  He reported minor nosebleeds. -CEA was 11.6 on 04/28/2019.  This is slightly above his baseline. -I reviewed his LFTs which are normal today.  White count is 5 and platelet count is 142.  He will proceed with his next treatment today.  We will see him back in 2 weeks for follow-up.  I plan to repeat scans in 4 to 6 months.  2.  Pulmonary embolism: -Incidental PE on CT on 05/20/2018.  He is continuing Xarelto without any bleeding issues.  3.  Peripheral neuropathy: -Numbness in the feet has been stable from prior oxaliplatin therapy.  4.  Diarrhea: -He is averaging Lomotil 2 to 3 tablets/day.  Diarrhea is well controlled.  5.  Multiple falls: -History of multiple falls since he had a head injury many  years ago.  He does not have any warning signs prior to fall. -Brain MRI on 02/12/2019 did not reveal any major pathology. -His fracture of the right wrist has healed.     Orders placed this encounter:  No orders of the defined types were placed in this encounter.     Derek Jack, MD [place] [phone]

## 2019-05-27 NOTE — Progress Notes (Signed)
Patient has been assessed, vital signs and labs have been reviewed by Dr. Katragadda. ANC, Creatinine, LFTs, and Platelets are within treatment parameters per Dr. Katragadda. The patient is good to proceed with treatment at this time.  

## 2019-05-27 NOTE — Progress Notes (Signed)
Patient presents today for treatment and follow up visit with Dr. Delton Coombes. Labs pending. HR elevated on arrival. Patient's  Baseline heart rate elevated. HR 109 today. Patient has no complaints of pain or any changes since his last visit.   Message received from [facility] LPN/ Dr. Delton Coombes. Proceed with treatment. Labs reviewed. MD aware of heart rate 109 on arrival.   Treatment given today per MD orders. Tolerated infusion without adverse affects. Vital signs stable. No complaints at this time. 5FU pump infusing per protocol. RUN noted on screen. Verified with patient. Discharged from clinic ambulatory. F/U with [facility] as scheduled.

## 2019-05-27 NOTE — Patient Instructions (Addendum)
[facility] at [facility] Discharge Instructions  You were seen today by Dr. Katragadda. He went over your recent lab results. He will see you back in 2 weeks for labs, treatment and follow up.   Thank you for choosing [facility] at [facility] to provide your oncology and hematology care.  To afford each patient quality time with our provider, please arrive at least 15 minutes before your scheduled appointment time.   If you have a lab appointment with the Cancer Center please come in thru the  Main Entrance and check in at the main information desk  You need to re-schedule your appointment should you arrive 10 or more minutes late.  We strive to give you quality time with our providers, and arriving late affects you and other patients whose appointments are after yours.  Also, if you no show three or more times for appointments you may be dismissed from the clinic at the providers discretion.     Again, thank you for choosing [facility].  Our hope is that these requests will decrease the amount of time that you wait before being seen by our physicians.       _____________________________________________________________  Should you have questions after your visit to [facility], please contact our office at [phone] between the hours of 8:00 a.m. and 4:30 p.m.  Voicemails left after 4:00 p.m. will not be returned until the following business day.  For prescription refill requests, have your pharmacy contact our office and allow 72 hours.    Cancer Center Support Programs:   > Cancer Support Group  2nd Tuesday of the month 1pm-2pm, Journey Room    

## 2019-05-27 NOTE — Patient Instructions (Signed)

## 2019-05-29 ENCOUNTER — Inpatient Hospital Stay (HOSPITAL_COMMUNITY): Payer: Medicaid Other

## 2019-05-29 ENCOUNTER — Encounter (HOSPITAL_COMMUNITY): Payer: Self-pay

## 2019-05-29 ENCOUNTER — Other Ambulatory Visit: Payer: Self-pay

## 2019-05-29 VITALS — BP 149/81 | HR 90 | Temp 96.9°F | Resp 20

## 2019-05-29 DIAGNOSIS — C184 Malignant neoplasm of transverse colon: Secondary | ICD-10-CM

## 2019-05-29 DIAGNOSIS — C188 Malignant neoplasm of overlapping sites of colon: Secondary | ICD-10-CM

## 2019-05-29 DIAGNOSIS — Z5111 Encounter for antineoplastic chemotherapy: Secondary | ICD-10-CM | POA: Diagnosis not present

## 2019-05-29 MED ORDER — HEPARIN SOD (PORK) LOCK FLUSH 100 UNIT/ML IV SOLN
500.0000 [IU] | Freq: Once | INTRAVENOUS | Status: AC | PRN
  Administered 2019-05-29: 12:00:00 500 [IU]

## 2019-05-29 MED ORDER — SODIUM CHLORIDE 0.9% FLUSH
10.0000 mL | INTRAVENOUS | Status: DC | PRN
  Administered 2019-05-29: 10 mL

## 2019-05-29 NOTE — Progress Notes (Signed)
Erik [name] tolerated 5FU pump well without complaints or incident. 5FU pump discontinued with portacath flushed easily per protocol then de-accessed. VSS Pt discharged self ambulatory using his cane in satisfactory condition

## 2019-05-29 NOTE — Patient Instructions (Signed)
[facility] at [facility] Discharge Instructions  5FU pump discontinued with portacath flushed per protocol. Follow-up as scheduled. Call clinic for any questions or concerns   Thank you for choosing [facility] at [facility] to provide your oncology and hematology care.  To afford each patient quality time with our provider, please arrive at least 15 minutes before your scheduled appointment time.   If you have a lab appointment with the Cancer Center please come in thru the Main Entrance and check in at the main information desk.  You need to re-schedule your appointment should you arrive 10 or more minutes late.  We strive to give you quality time with our providers, and arriving late affects you and other patients whose appointments are after yours.  Also, if you no show three or more times for appointments you may be dismissed from the clinic at the providers discretion.     Again, thank you for choosing [facility].  Our hope is that these requests will decrease the amount of time that you wait before being seen by our physicians.       _____________________________________________________________  Should you have questions after your visit to [facility], please contact our office at [phone] between the hours of 8:00 a.m. and 4:30 p.m.  Voicemails left after 4:00 p.m. will not be returned until the following business day.  For prescription refill requests, have your pharmacy contact our office and allow 72 hours.    Due to Covid, you will need to wear a mask upon entering the hospital. If you do not have a mask, a mask will be given to you at the Main Entrance upon arrival. For doctor visits, patients may have 1 support person with them. For treatment visits, patients can not have anyone with them due to social distancing guidelines and our immunocompromised population.     

## 2019-06-04 ENCOUNTER — Ambulatory Visit: Payer: Medicaid Other | Attending: Internal Medicine

## 2019-06-04 DIAGNOSIS — Z23 Encounter for immunization: Secondary | ICD-10-CM

## 2019-06-04 NOTE — Progress Notes (Signed)
   Covid-19 Vaccination Clinic  Name:  JUA KIKER    MRN: [id number] DOB: 1955-03-17  06/04/2019  Mr. Magadan was observed post Covid-19 immunization for 15 minutes without incident. He was provided with Vaccine Information Sheet and instruction to access the V-Safe system.   Mr. Steinwand was instructed to call 911 with any severe reactions post vaccine: Marland Kitchen Difficulty breathing  . Swelling of face and throat  . A fast heartbeat  . A bad rash all over body  . Dizziness and weakness   Immunizations Administered    Name Date Dose VIS Date Route   Moderna COVID-19 Vaccine 06/04/2019 11:08 AM 0.5 mL 01/2019 Intramuscular   Manufacturer: Moderna   Lot: [id number]   [id number]

## 2019-06-10 ENCOUNTER — Other Ambulatory Visit: Payer: Self-pay

## 2019-06-10 ENCOUNTER — Inpatient Hospital Stay (HOSPITAL_COMMUNITY): Payer: Medicaid Other | Attending: Hematology | Admitting: Hematology

## 2019-06-10 ENCOUNTER — Inpatient Hospital Stay (HOSPITAL_COMMUNITY): Payer: Medicaid Other

## 2019-06-10 ENCOUNTER — Ambulatory Visit (HOSPITAL_COMMUNITY): Payer: Medicaid Other | Attending: Hematology

## 2019-06-10 ENCOUNTER — Encounter (HOSPITAL_COMMUNITY): Payer: Self-pay

## 2019-06-10 VITALS — BP 135/79 | HR 102 | Temp 97.4°F | Resp 18 | Wt 211.6 lb

## 2019-06-10 DIAGNOSIS — Z5112 Encounter for antineoplastic immunotherapy: Secondary | ICD-10-CM | POA: Diagnosis present

## 2019-06-10 DIAGNOSIS — C188 Malignant neoplasm of overlapping sites of colon: Secondary | ICD-10-CM | POA: Diagnosis not present

## 2019-06-10 DIAGNOSIS — C772 Secondary and unspecified malignant neoplasm of intra-abdominal lymph nodes: Secondary | ICD-10-CM | POA: Diagnosis not present

## 2019-06-10 DIAGNOSIS — C184 Malignant neoplasm of transverse colon: Secondary | ICD-10-CM

## 2019-06-10 DIAGNOSIS — Z5111 Encounter for antineoplastic chemotherapy: Secondary | ICD-10-CM | POA: Diagnosis not present

## 2019-06-10 DIAGNOSIS — E538 Deficiency of other specified B group vitamins: Secondary | ICD-10-CM

## 2019-06-10 LAB — URINALYSIS, DIPSTICK ONLY
Bilirubin Urine: NEGATIVE
Glucose, UA: NEGATIVE mg/dL
Ketones, ur: NEGATIVE mg/dL
Nitrite: NEGATIVE
Protein, ur: 30 mg/dL — AB
Specific Gravity, Urine: 1.014 (ref 1.005–1.030)
pH: 5 (ref 5.0–8.0)

## 2019-06-10 LAB — CBC WITH DIFFERENTIAL/PLATELET
Abs Immature Granulocytes: 0.02 10*3/uL (ref 0.00–0.07)
Basophils Absolute: 0.1 10*3/uL (ref 0.0–0.1)
Basophils Relative: 2 %
Eosinophils Absolute: 0.3 10*3/uL (ref 0.0–0.5)
Eosinophils Relative: 5 %
HCT: 44.2 % (ref 39.0–52.0)
Hemoglobin: 14.1 g/dL (ref 13.0–17.0)
Immature Granulocytes: 0 %
Lymphocytes Relative: 28 %
Lymphs Abs: 1.5 10*3/uL (ref 0.7–4.0)
MCH: 32.6 pg (ref 26.0–34.0)
MCHC: 31.9 g/dL (ref 30.0–36.0)
MCV: 102.1 fL — ABNORMAL HIGH (ref 80.0–100.0)
Monocytes Absolute: 0.6 10*3/uL (ref 0.1–1.0)
Monocytes Relative: 11 %
Neutro Abs: 3 10*3/uL (ref 1.7–7.7)
Neutrophils Relative %: 54 %
Platelets: 194 10*3/uL (ref 150–400)
RBC: 4.33 MIL/uL (ref 4.22–5.81)
RDW: 16.1 % — ABNORMAL HIGH (ref 11.5–15.5)
WBC: 5.4 10*3/uL (ref 4.0–10.5)
nRBC: 0 % (ref 0.0–0.2)

## 2019-06-10 LAB — COMPREHENSIVE METABOLIC PANEL
ALT: 10 U/L (ref 0–44)
AST: 17 U/L (ref 15–41)
Albumin: 3.5 g/dL (ref 3.5–5.0)
Alkaline Phosphatase: 133 U/L — ABNORMAL HIGH (ref 38–126)
Anion gap: 13 (ref 5–15)
BUN: 8 mg/dL (ref 8–23)
CO2: 28 mmol/L (ref 22–32)
Calcium: 9 mg/dL (ref 8.9–10.3)
Chloride: 95 mmol/L — ABNORMAL LOW (ref 98–111)
Creatinine, Ser: 0.94 mg/dL (ref 0.61–1.24)
GFR calc Af Amer: >60 mL/min (ref >60)
GFR calc non Af Amer: >60 mL/min (ref >60)
Glucose, Bld: 132 mg/dL — ABNORMAL HIGH (ref 70–99)
Potassium: 4.3 mmol/L (ref 3.5–5.1)
Sodium: 136 mmol/L (ref 135–145)
Total Bilirubin: 0.7 mg/dL (ref 0.3–1.2)
Total Protein: 7.4 g/dL (ref 6.5–8.1)

## 2019-06-10 LAB — MAGNESIUM: Magnesium: 1.9 mg/dL (ref 1.7–2.4)

## 2019-06-10 MED ORDER — FLUOROURACIL CHEMO INJECTION 2.5 GM/50ML
400.0000 mg/m2 | Freq: Once | INTRAVENOUS | Status: AC
  Administered 2019-06-10: 950 mg via INTRAVENOUS
  Filled 2019-06-10: qty 19

## 2019-06-10 MED ORDER — CYANOCOBALAMIN 1000 MCG/ML IJ SOLN
1000.0000 ug | Freq: Once | INTRAMUSCULAR | Status: AC
  Administered 2019-06-10: 1000 ug via INTRAMUSCULAR
  Filled 2019-06-10: qty 1

## 2019-06-10 MED ORDER — SODIUM CHLORIDE 0.9 % IV SOLN
2400.0000 mg/m2 | INTRAVENOUS | Status: DC
  Administered 2019-06-10: 5650 mg via INTRAVENOUS
  Filled 2019-06-10: qty 113

## 2019-06-10 MED ORDER — PALONOSETRON HCL INJECTION 0.25 MG/5ML
INTRAVENOUS | Status: AC
  Filled 2019-06-10: qty 5

## 2019-06-10 MED ORDER — SODIUM CHLORIDE 0.9 % IV SOLN
10.0000 mg | Freq: Once | INTRAVENOUS | Status: AC
  Administered 2019-06-10: 10 mg via INTRAVENOUS
  Filled 2019-06-10: qty 10

## 2019-06-10 MED ORDER — SODIUM CHLORIDE 0.9 % IV SOLN
900.0000 mg | Freq: Once | INTRAVENOUS | Status: AC
  Administered 2019-06-10: 900 mg via INTRAVENOUS
  Filled 2019-06-10: qty 45

## 2019-06-10 MED ORDER — SODIUM CHLORIDE 0.9 % IV SOLN: Freq: Once | INTRAVENOUS | Status: AC

## 2019-06-10 MED ORDER — SODIUM CHLORIDE 0.9 % IV SOLN
5.0000 mg/kg | Freq: Once | INTRAVENOUS | Status: AC
  Administered 2019-06-10: 500 mg via INTRAVENOUS
  Filled 2019-06-10: qty 4

## 2019-06-10 MED ORDER — PALONOSETRON HCL INJECTION 0.25 MG/5ML
0.2500 mg | Freq: Once | INTRAVENOUS | Status: AC
  Administered 2019-06-10: 0.25 mg via INTRAVENOUS

## 2019-06-10 MED ORDER — ATROPINE SULFATE 1 MG/ML IJ SOLN
0.5000 mg | Freq: Once | INTRAMUSCULAR | Status: AC
  Administered 2019-06-10: 0.5 mg via INTRAVENOUS

## 2019-06-10 MED ORDER — ATROPINE SULFATE 1 MG/ML IJ SOLN
INTRAMUSCULAR | Status: AC
  Filled 2019-06-10: qty 1

## 2019-06-10 NOTE — Patient Instructions (Signed)
[facility] Discharge Instructions for Patients Receiving Chemotherapy  Today you received the following chemotherapy agents   To help prevent nausea and vomiting after your treatment, we encourage you to take your nausea medication   If you develop nausea and vomiting that is not controlled by your nausea medication, call the clinic.   BELOW ARE SYMPTOMS THAT SHOULD BE REPORTED IMMEDIATELY:  *FEVER GREATER THAN 100.5 F  *CHILLS WITH OR WITHOUT FEVER  NAUSEA AND VOMITING THAT IS NOT CONTROLLED WITH YOUR NAUSEA MEDICATION  *UNUSUAL SHORTNESS OF BREATH  *UNUSUAL BRUISING OR BLEEDING  TENDERNESS IN MOUTH AND THROAT WITH OR WITHOUT PRESENCE OF ULCERS  *URINARY PROBLEMS  *BOWEL PROBLEMS  UNUSUAL RASH Items with * indicate a potential emergency and should be followed up as soon as possible.  Feel free to call the clinic should you have any questions or concerns. The clinic phone number is [phone].  Please show the CHEMO ALERT CARD at check-in to the Emergency Department and triage nurse.   

## 2019-06-10 NOTE — Patient Instructions (Addendum)
[facility] at [facility] Discharge Instructions  You were seen today by Dr. Katragadda. He went over your recent lab results. He will see you back in 2 weeks for labs, treatment and follow up.   Thank you for choosing [facility] at [facility] to provide your oncology and hematology care.  To afford each patient quality time with our provider, please arrive at least 15 minutes before your scheduled appointment time.   If you have a lab appointment with the Cancer Center please come in thru the  Main Entrance and check in at the main information desk  You need to re-schedule your appointment should you arrive 10 or more minutes late.  We strive to give you quality time with our providers, and arriving late affects you and other patients whose appointments are after yours.  Also, if you no show three or more times for appointments you may be dismissed from the clinic at the providers discretion.     Again, thank you for choosing  Cancer Center.  Our hope is that these requests will decrease the amount of time that you wait before being seen by our physicians.       _____________________________________________________________  Should you have questions after your visit to  Cancer Center, please contact our office at [phone] between the hours of 8:00 a.m. and 4:30 p.m.  Voicemails left after 4:00 p.m. will not be returned until the following business day.  For prescription refill requests, have your pharmacy contact our office and allow 72 hours.    Cancer Center Support Programs:   > Cancer Support Group  2nd Tuesday of the month 1pm-2pm, Journey Room    

## 2019-06-10 NOTE — Progress Notes (Signed)
Patient presents today for treatment and follow up visit with Dr. Delton Coombes. Labs pending. Vital signs within parameters for treatment. Patient states the Neuropathy in his feet bilateral is much worse and causing him to stumble. Patient has acute pain in the Left thigh that he rates a 6/10. Patient states the pain is sharp and gets worse as he gets out of a his chair. Patient has had an issue with an ongoing nose bleed from the Left nare that he didn't mention the last visit per patient's words.   Labs are within parameters for treatment today.   Message received from [facility] LPN/ Dr. Delton Coombes to proceed with treatment. Labs reviewed. HR 103. Patient runs elevated and that's the patient's normal.   Treatment given today per MD orders. Tolerated infusion without adverse affects. Vital signs stable. 5FU pump infusing per protocol. RUN noted on screen. Verified with patient. No complaints at this time. Discharged from clinic ambulatory. F/U with [facility] as scheduled.

## 2019-06-10 NOTE — Progress Notes (Signed)
Patient has been assessed, vital signs and labs have been reviewed by Dr. Katragadda. ANC, Creatinine, LFTs, and Platelets are within treatment parameters per Dr. Katragadda. The patient is good to proceed with treatment at this time.  

## 2019-06-10 NOTE — Progress Notes (Signed)
[place], [place] [address]   CLINIC:  Medical Oncology/Hematology  PCP:  Alanson Puls, [facility] [place] [address] [phone]   REASON FOR VISIT:  Follow-up for metastatic colon cancer   BRIEF ONCOLOGIC HISTORY:  Oncology History Overview Note  Stage IIIC [name] adenocarcinoma of transverse colon, diagnosed on colonoscopy by Dr. Oneida Alar on 01/12/2015 followed by definitive surgery by Dr. [name] with right hemicolectomy on 02/12/2015.  He then underwent FOLFOX x 12 cycles in the adjuvant setting (03/30/2015- 09/06/2015).   Malignant neoplasm of transverse colon ([place])  01/12/2015 Pathologic Stage   Colon, biopsy, distal transverse - TUBULOVILLOUS ADENOMA WITH HIGH GRADE DYSPLASIA.   01/12/2015 Procedure   Colonoscopy by Dr. Oneida Alar.   01/12/2015 Tumor Marker   CEA: 6.6 (H)    01/18/2015 Imaging   CT abd/pelvis- Apple-core lesion identified in the mid transverse colon without obstruction. No evidence for lymphadenopathy in the gastrohepatic ligament or omentum.  Stable 8 mm hypo attenuating lesion in the left liver, likely a cyst.   02/10/2015 Initial Diagnosis   Adenocarcinoma of transverse colon ([place])   02/12/2015 Definitive Surgery   [name], Extended right hemicolectomy    02/12/2015 Pathology Results   Mucinous adenocarcinoma with penetration of visceral peritoneum, 4/19 lymph nodes for metastatic disease, negative resection margins, with LVI and perineural invasion   03/30/2015 - 09/06/2015 Chemotherapy   FOLFOX x 12 cycles   05/25/2015 Treatment Plan Change   5 FU bolus discontinued for cycle #5   06/08/2015 Treatment Plan Change   Treatment deferred x 1 week   06/15/2015 Treatment Plan Change   5FU CI decreased by 10% and Oxaliplatin reduced by 15% for cycles #6-#11; Oxaliplatin dropped for cycle #12 d/t neuropathy.    10/20/2015 Imaging   CT CAP- Right hemicolectomy without evidence of  metastatic disease. 2. Previously measured ground-glass lesion in the left upper lobe has resolved. 3. Probable food debris in the stomach, simulating gastric wall thickening. Please correlate clinically. 4. 6 mm irregular nodular density in the left upper lobe, stable. Continued attention on followup exams is warranted.   01/25/2016 Procedure   Colonoscopy by Dr. Oneida Alar- Non-thrombosed external hemorrhoids found on digital rectal exam. - One 4 mm polyp in the rectum, removed with a cold biopsy forceps. Resected and retrieved. - Congested mucosa in the neo-terminal ileum. Biopsied. - Redundant colon. - Internal hemorrhoids.   01/26/2016 Pathology Results   1. Terminal ileum, biopsy - MILD ACUTE (ACTIVE) ILEITIS. - NO DYSPLASIA OR MALIGNANCY IDENTIFIED - SEE COMMENT. 2. Rectum, polyp(s) - HYPERPLASTIC POLYP (X 1). - NO DYSPLASIA OR MALIGNANCY IDENTIFIED.   04/13/2016 Imaging   CT chest- Stable CT chest. 6 mm irregular nodule anterior left upper lobe is stable. The scattered areas of peribronchovascular micro nodularity in the lungs bilaterally are unchanged.   10/19/2016 Imaging   CT CAP: 1. Status post right hemicolectomy. No findings to suggest metastatic disease in the abdomen or pelvis. 2. Aortic atherosclerosis. 3. Additional incidental findings, as above. Aortic Atherosclerosis (ICD10-I70.0).   01/09/2018 -  Chemotherapy   The patient had palonosetron (ALOXI) injection 0.25 mg, 0.25 mg, Intravenous,  Once, 35 of 36 cycles Administration: 0.25 mg (01/09/2018), 0.25 mg (01/23/2018), 0.25 mg (02/11/2018), 0.25 mg (02/25/2018), 0.25 mg (03/12/2018), 0.25 mg (03/27/2018), 0.25 mg (04/10/2018), 0.25 mg (04/24/2018), 0.25 mg (05/08/2018), 0.25 mg (05/22/2018), 0.25 mg (06/10/2018), 0.25 mg (06/26/2018), 0.25 mg (07/10/2018), 0.25 mg (07/24/2018), 0.25 mg (08/13/2018), 0.25 mg (08/27/2018),  0.25 mg (09/10/2018), 0.25 mg (09/24/2018), 0.25 mg (10/08/2018), 0.25 mg (11/05/2018), 0.25 mg (11/19/2018), 0.25 mg  (12/04/2018), 0.25 mg (12/18/2018), 0.25 mg (01/08/2019), 0.25 mg (01/22/2019), 0.25 mg (02/04/2019), 0.25 mg (02/18/2019), 0.25 mg (03/04/2019), 0.25 mg (03/18/2019), 0.25 mg (04/01/2019), 0.25 mg (04/14/2019), 0.25 mg (04/28/2019), 0.25 mg (05/13/2019), 0.25 mg (05/27/2019), 0.25 mg (06/10/2019) irinotecan (CAMPTOSAR) 420 mg in dextrose 5 % 500 mL chemo infusion, 180 mg/m2 = 420 mg, Intravenous,  Once, 9 of 9 cycles Administration: 420 mg (01/09/2018), 420 mg (01/23/2018), 420 mg (02/11/2018), 420 mg (02/25/2018), 420 mg (03/12/2018), 420 mg (03/27/2018), 420 mg (04/10/2018), 420 mg (04/24/2018), 420 mg (05/08/2018) leucovorin 900 mg in dextrose 5 % 250 mL infusion, 944 mg, Intravenous,  Once, 35 of 36 cycles Administration: 900 mg (01/09/2018), 900 mg (01/23/2018), 900 mg (02/11/2018), 900 mg (02/25/2018), 900 mg (03/12/2018), 900 mg (03/27/2018), 900 mg (04/10/2018), 900 mg (04/24/2018), 900 mg (05/08/2018), 900 mg (06/10/2018), 900 mg (06/26/2018), 900 mg (07/10/2018), 900 mg (07/24/2018), 900 mg (08/13/2018), 900 mg (08/27/2018), 900 mg (09/10/2018), 900 mg (09/24/2018), 900 mg (10/08/2018), 900 mg (11/05/2018), 900 mg (11/19/2018), 900 mg (12/04/2018), 900 mg (12/18/2018), 900 mg (01/08/2019), 900 mg (01/22/2019), 900 mg (02/04/2019), 900 mg (02/18/2019), 900 mg (03/04/2019), 900 mg (03/18/2019), 900 mg (04/01/2019), 900 mg (04/14/2019), 900 mg (04/28/2019), 900 mg (05/13/2019), 900 mg (05/27/2019), 900 mg (06/10/2019) fluorouracil (ADRUCIL) chemo injection 950 mg, 400 mg/m2 = 950 mg, Intravenous,  Once, 35 of 36 cycles Administration: 950 mg (01/09/2018), 950 mg (01/23/2018), 950 mg (02/11/2018), 950 mg (02/25/2018), 950 mg (03/12/2018), 950 mg (03/27/2018), 950 mg (04/10/2018), 950 mg (04/24/2018), 950 mg (05/08/2018), 950 mg (05/22/2018), 950 mg (06/10/2018), 950 mg (06/26/2018), 950 mg (07/10/2018), 950 mg (07/24/2018), 950 mg (08/13/2018), 950 mg (08/27/2018), 950 mg (09/10/2018), 950 mg (09/24/2018), 950 mg (10/08/2018), 950 mg (11/05/2018), 950 mg (11/19/2018), 950 mg (12/04/2018), 950 mg  (12/18/2018), 950 mg (01/08/2019), 950 mg (01/22/2019), 950 mg (02/04/2019), 950 mg (02/18/2019), 950 mg (03/04/2019), 950 mg (03/18/2019), 950 mg (04/01/2019), 950 mg (04/14/2019), 950 mg (04/28/2019), 950 mg (05/13/2019), 950 mg (05/27/2019), 950 mg (06/10/2019) fluorouracil (ADRUCIL) 5,650 mg in sodium chloride 0.9 % 137 mL chemo infusion, 2,400 mg/m2 = 5,650 mg, Intravenous, 1 Day/Dose, 35 of 36 cycles Administration: 5,650 mg (01/09/2018), 5,650 mg (01/23/2018), 5,650 mg (02/11/2018), 5,650 mg (02/25/2018), 5,650 mg (03/12/2018), 5,650 mg (03/27/2018), 5,650 mg (04/10/2018), 5,650 mg (04/24/2018), 5,650 mg (05/08/2018), 5,650 mg (05/22/2018), 5,650 mg (06/10/2018), 5,650 mg (06/26/2018), 5,650 mg (07/10/2018), 5,650 mg (07/24/2018), 5,650 mg (08/13/2018), 5,650 mg (08/27/2018), 5,650 mg (09/10/2018), 5,650 mg (09/24/2018), 5,650 mg (10/08/2018), 5,650 mg (11/05/2018), 5,650 mg (11/19/2018), 5,650 mg (12/04/2018), 5,650 mg (12/18/2018), 5,650 mg (01/08/2019), 5,650 mg (01/22/2019), 5,650 mg (02/04/2019), 5,650 mg (02/18/2019), 5,650 mg (03/04/2019), 5,650 mg (03/18/2019), 5,650 mg (04/01/2019), 5,650 mg (04/14/2019), 5,650 mg (04/28/2019), 5,650 mg (05/13/2019), 5,650 mg (05/27/2019), 5,650 mg (06/10/2019) bevacizumab-awwb (MVASI) 500 mg in sodium chloride 0.9 % 100 mL chemo infusion, 4.7 mg/kg = 525 mg (100 % of original dose 5 mg/kg), Intravenous,  Once, 1 of 1 cycle Dose modification: 5 mg/kg (original dose 5 mg/kg, Cycle 24) Administration: 500 mg (01/08/2019) bevacizumab-bvzr (ZIRABEV) 500 mg in sodium chloride 0.9 % 100 mL chemo infusion, 5 mg/kg = 500 mg (100 % of original dose 5 mg/kg), Intravenous,  Once, 14 of 15 cycles Dose modification: 5 mg/kg (original dose 5 mg/kg, Cycle 21) Administration: 500 mg (11/19/2018), 500 mg (12/04/2018), 500 mg (12/18/2018), 500 mg (01/22/2019), 500 mg (02/04/2019), 500 mg (02/18/2019), 500 mg (03/04/2019), 500 mg (  03/18/2019), 500 mg (04/01/2019), 500 mg (04/14/2019), 500 mg (04/28/2019), 500 mg (05/13/2019), 500 mg  (05/27/2019), 500 mg (06/10/2019)  for chemotherapy treatment.    01/23/2018 Cancer Staging   Staging form: Colon and Rectum, AJCC 7th Edition - Pathologic: M1 - Signed by Zoila Shutter, MD on 01/23/2018   Malignant neoplasm of overlapping sites of colon ([place])  12/26/2017 Initial Diagnosis   Cancer of overlapping sites of colon metastatic to intra-abdominal lymph node ([place])   01/09/2018 -  Chemotherapy   The patient had palonosetron (ALOXI) injection 0.25 mg, 0.25 mg, Intravenous,  Once, 13 of 14 cycles Administration: 0.25 mg (01/09/2018), 0.25 mg (01/23/2018), 0.25 mg (02/11/2018), 0.25 mg (02/25/2018), 0.25 mg (03/12/2018), 0.25 mg (03/27/2018), 0.25 mg (04/10/2018), 0.25 mg (04/24/2018), 0.25 mg (05/08/2018), 0.25 mg (05/22/2018), 0.25 mg (06/10/2018), 0.25 mg (06/26/2018), 0.25 mg (07/10/2018) irinotecan (CAMPTOSAR) 420 mg in dextrose 5 % 500 mL chemo infusion, 180 mg/m2 = 420 mg, Intravenous,  Once, 9 of 9 cycles Administration: 420 mg (01/09/2018), 420 mg (01/23/2018), 420 mg (02/11/2018), 420 mg (02/25/2018), 420 mg (03/12/2018), 420 mg (03/27/2018), 420 mg (04/10/2018), 420 mg (04/24/2018), 420 mg (05/08/2018) leucovorin 900 mg in dextrose 5 % 250 mL infusion, 944 mg, Intravenous,  Once, 13 of 14 cycles Administration: 900 mg (01/09/2018), 900 mg (01/23/2018), 900 mg (02/11/2018), 900 mg (02/25/2018), 900 mg (03/12/2018), 900 mg (03/27/2018), 900 mg (04/10/2018), 900 mg (04/24/2018), 900 mg (05/08/2018), 900 mg (06/10/2018), 900 mg (06/26/2018), 900 mg (07/10/2018) fluorouracil (ADRUCIL) chemo injection 950 mg, 400 mg/m2 = 950 mg, Intravenous,  Once, 13 of 14 cycles Administration: 950 mg (01/09/2018), 950 mg (01/23/2018), 950 mg (02/11/2018), 950 mg (02/25/2018), 950 mg (03/12/2018), 950 mg (03/27/2018), 950 mg (04/10/2018), 950 mg (04/24/2018), 950 mg (05/08/2018), 950 mg (05/22/2018), 950 mg (06/10/2018), 950 mg (06/26/2018), 950 mg (07/10/2018) fluorouracil (ADRUCIL) 5,650 mg in sodium chloride 0.9 % 137 mL chemo infusion, 2,400 mg/m2 = 5,650 mg,  Intravenous, 1 Day/Dose, 13 of 14 cycles Administration: 5,650 mg (01/09/2018), 5,650 mg (01/23/2018), 5,650 mg (02/11/2018), 5,650 mg (02/25/2018), 5,650 mg (03/12/2018), 5,650 mg (03/27/2018), 5,650 mg (04/10/2018), 5,650 mg (04/24/2018), 5,650 mg (05/08/2018), 5,650 mg (05/22/2018), 5,650 mg (06/10/2018), 5,650 mg (06/26/2018), 5,650 mg (07/10/2018)  for chemotherapy treatment.    Colon cancer metastasized to multiple sites [facility])  01/16/2018 Initial Diagnosis   Colon cancer metastasized to multiple sites [facility])   01/23/2018 - 02/10/2018 Chemotherapy   The patient had panitumumab (VECTIBIX) 600 mg in sodium chloride 0.9 % 100 mL chemo infusion, 640 mg, Intravenous,  Once, 1 of 3 cycles Administration: 600 mg (01/23/2018)  for chemotherapy treatment.    03/12/2018 - 10/21/2018 Chemotherapy   The patient had bevacizumab (AVASTIN) 500 mg in sodium chloride 0.9 % 100 mL chemo infusion, 525 mg, Intravenous,  Once, 15 of 22 cycles Administration: 500 mg (03/12/2018), 500 mg (03/27/2018), 500 mg (04/10/2018), 500 mg (04/24/2018), 500 mg (06/10/2018), 500 mg (05/08/2018), 500 mg (05/22/2018), 500 mg (06/26/2018), 500 mg (07/10/2018), 500 mg (07/24/2018), 500 mg (08/13/2018), 500 mg (08/27/2018), 500 mg (09/10/2018), 500 mg (09/24/2018), 500 mg (10/08/2018)  for chemotherapy treatment.       CANCER STAGING: Cancer Staging Malignant neoplasm of transverse colon [facility]) Staging form: Colon and Rectum, AJCC 7th Edition - Pathologic stage from 03/04/2015: Stage IIIC (T4a, N2a, cM0) - Signed by Baird Cancer, PA-C on 03/04/2015 - Pathologic: M1 - Signed by Zoila Shutter, MD on 01/23/2018    INTERVAL HISTORY:  Mr. Tallon 64 y.o. male seen for follow-up and toxicity assessment prior  to chemotherapy for metastatic colon cancer.  He reported one nosebleed.  Numbness in the feet is slightly worse on and off.  He has lost 10 pounds in the last 2 months.  He is drinking 2 cans of boost per day.  He reports decrease in appetite.  Diarrhea is well  controlled with Lomotil.  REVIEW OF SYSTEMS:  Review of Systems  HENT:   Positive for nosebleeds.   Gastrointestinal: Positive for diarrhea.  Neurological: Positive for numbness.  All other systems reviewed and are negative.    PAST MEDICAL/SURGICAL HISTORY:  Past Medical History:  Diagnosis Date  . Abnormal stress echocardiogram   . Adenocarcinoma of transverse colon ([place]) 02/10/2015  . Alcohol abuse    Heavy Use up until 2010  . Anxiety   . Blood transfusion without reported diagnosis   . Cancer of overlapping sites of colon metastatic to intra-abdominal lymph node ([place]) 12/26/2017  . COPD (chronic obstructive pulmonary disease) ([place])   . Depression   . Emphysema of lung ([place])   . GERD (gastroesophageal reflux disease)   . Head trauma 2001   closed head injury; coma for 4 weeks  . Hypercholesterolemia   . Hypertension   . ING HERN W/GANGREN RECUR UNILAT/UNSPEC ING HERN 04/21/2009   Qualifier: Diagnosis of  By: Verl Blalock, MD, Delanna Ahmadi PTSD (post-traumatic stress disorder)   . Pulmonary nodule 06/14/2016  . SAH (subarachnoid hemorrhage) ([place]) 08/31/2012  . SDH (subdural hematoma) ([place]) 08/31/2012   Past Surgical History:  Procedure Laterality Date  . BIOPSY  01/25/2016   Procedure: BIOPSY;  Surgeon: Danie Binder, MD;  [facility];  Service: Endoscopy;;  ileum;   . cardiac cath    . COLONOSCOPY  2011   Dr. Oneida Alar: multiple adenomas and hyperplastic polyps  . COLONOSCOPY WITH PROPOFOL N/A 01/12/2015   Procedure: COLONOSCOPY WITH PROPOFOL;  Surgeon: Danie Binder, MD;  [facility];  Service: Endoscopy;  Laterality: N/A;  1030  . COLONOSCOPY WITH PROPOFOL N/A 01/25/2016   Procedure: COLONOSCOPY WITH PROPOFOL;  Surgeon: Danie Binder, MD;  [facility];  Service: Endoscopy;  Laterality: N/A;  1230  . CRANIOTOMY  2001  . ESOPHAGOGASTRODUODENOSCOPY (EGD) WITH PROPOFOL N/A 01/12/2015   Procedure: ESOPHAGOGASTRODUODENOSCOPY (EGD) WITH  PROPOFOL;  Surgeon: Danie Binder, MD;  [facility];  Service: Endoscopy;  Laterality: N/A;  . head injury surgery    . HERNIA REPAIR Right 2012   Inguinal- [place] Na  . KIDNEY SURGERY     >30 years ago  . LAPAROSCOPIC RIGHT HEMI COLECTOMY Left 02/10/2015   Procedure: LAPAROSCOPIC THEN OPEN RIGHT HEMI COLECTOMY;  Surgeon: [name], MD;  Location: [facility];  Service: General;  Laterality: Left;  . LAPAROTOMY N/A 12/19/2017   Procedure: EXPLORATORY LAPAROTOMY;  Surgeon: Aviva Signs, MD;  Location: [facility];  Service: General;  Laterality: N/A;  . OPEN REDUCTION INTERNAL FIXATION (ORIF) DISTAL RADIAL FRACTURE Right 10/18/2018   Procedure: OPEN REDUCTION INTERNAL FIXATION (ORIF) RIGHT DISTAL RADIAL FRACTURE;  Surgeon: [name], MD;  Location: [place];  Service: Orthopedics;  Laterality: Right;  block in preop  . OPEN REDUCTION INTERNAL FIXATION (ORIF) DISTAL RADIAL FRACTURE Right 12/20/2018   Procedure: OPEN REDUCTION INTERNAL FIXATION (ORIF) DISTAL RADIUS AND ULNA FRACTURE;  Surgeon: [name], MD;  Location: [place];  Service: Orthopedics;  Laterality: Right;  block  . POLYPECTOMY  01/25/2016   Procedure: POLYPECTOMY;  Surgeon: Marga Melnick  Fields, MD;  [facility];  Service: Endoscopy;;  colon  . PORT-A-CATH REMOVAL N/A 02/26/2017   Procedure: MINOR REMOVAL PORT-A-CATH;  Surgeon: Aviva Signs, MD;  Location: [facility];  Service: General;  Laterality: N/A;  Pt to arrive at [place] N/A 03/24/2015   Procedure: INSERTION PORT-A-CATH;  Surgeon: Jules Husbands, MD;  Location: [facility];  Service: General;  Laterality: N/A;  . PORTACATH PLACEMENT Left 01/07/2018   Procedure: INSERTION PORT A CATH (ATTACHED CATHETER IN LEFT SUBCLAVIAN);  Surgeon: Aviva Signs, MD;  Location: [facility];  Service: General;  Laterality: Left;     SOCIAL HISTORY:  Social History   Socioeconomic History  . Marital status: Divorced     Spouse name: Not on file  . Number of children: 2  . Years of education: 31  . Highest education level: Not on file  Occupational History  . Occupation: disabled/unemployed  . Occupation: unemployed/Mediicaid only    Comment: NO INCOME  Tobacco Use  . Smoking status: Former Smoker    Packs/day: 0.10    Years: 30.00    Pack years: 3.00    Types: Cigarettes    Start date: 02/06/1973    Quit date: 12/09/2017    Years since quitting: 1.5  . Smokeless tobacco: Never Used  . Tobacco comment: Quit on 12/09/2017  Substance and Sexual Activity  . Alcohol use: Yes    Alcohol/week: 0.0 standard drinks    Comment: beer occ, history of ETOH abuse in remote past.   . Drug use: No  . Sexual activity: Never    Partners: Female    Birth control/protection: None  Other Topics Concern  . Not on file  Social History Narrative   Single   Lives alone   Turned down for disability   Social Determinants of Health   Financial Resource Strain:   . Difficulty of Paying Living Expenses:   Food Insecurity:   . Worried About Charity fundraiser in the Last Year:   . Arboriculturist in the Last Year:   Transportation Needs:   . Film/video editor (Medical):   Marland Kitchen Lack of Transportation (Non-Medical):   Physical Activity:   . Days of Exercise per Week:   . Minutes of Exercise per Session:   Stress:   . Feeling of Stress :   Social Connections:   . Frequency of Communication with Friends and Family:   . Frequency of Social Gatherings with Friends and Family:   . Attends Religious Services:   . Active Member of Clubs or Organizations:   . Attends Archivist Meetings:   Marland Kitchen Marital Status:   Intimate Partner Violence:   . Fear of Current or Ex-Partner:   . Emotionally Abused:   Marland Kitchen Physically Abused:   . Sexually Abused:     FAMILY HISTORY:  Family History  Problem Relation Age of Onset  . Pulmonary embolism Mother   . Breast cancer Mother   . Cancer Mother        breast cancer    . Arthritis Mother   . Heart disease Father   . Cancer Father 42       Leukemia  . Cancer Paternal Grandfather        Lung  . Ataxia Neg Hx   . Chorea Neg Hx   . Dementia Neg Hx   . Mental retardation Neg Hx   . Migraines Neg Hx   . Multiple sclerosis Neg  Hx   . Neurofibromatosis Neg Hx   . Neuropathy Neg Hx   . Parkinsonism Neg Hx   . Seizures Neg Hx   . Stroke Neg Hx   . Colon cancer Neg Hx     CURRENT MEDICATIONS:  Outpatient Encounter Medications as of 06/10/2019  Medication Sig Note  . amitriptyline (ELAVIL) 25 MG tablet Take 1 tablet (25 mg total) by mouth at bedtime.   Marland Kitchen atorvastatin (LIPITOR) 20 MG tablet TAKE 1 TABLET BY MOUTH AT BEDTIME (Patient taking differently: Take 20 mg by mouth daily. )   . buPROPion (WELLBUTRIN SR) 150 MG 12 hr tablet Take 150 mg by mouth every morning.    . busPIRone (BUSPAR) 10 MG tablet Take 10 mg by mouth 2 (two) times daily.   . fluorouracil CALGB 11657 in sodium chloride 0.9 % 150 mL Inject 5,650 mg into the vein. Over 46 hours   . FLUoxetine (PROZAC) 20 MG capsule TAKE 1 CAPSULE BY MOUTH 2 TIMES A DAY (Patient taking differently: Take 20 mg by mouth 2 (two) times daily. ) 06/10/2018: 1 tab TID  . gabapentin (NEURONTIN) 300 MG capsule TAKE 1 CAPSULE BY MOUTH THREE TIMES A DAY   . IRINOTECAN HCL IV Inject 420 mg into the vein every 14 (fourteen) days.   Marland Kitchen LEUCOVORIN CALCIUM IV Inject 944 mg into the vein every 14 (fourteen) days.   Marland Kitchen lidocaine-prilocaine (EMLA) cream APPLY A SMALL AMOUNT OVER PORT SITE AND COVER WITH PLASTIC WRAP ONE HOUR PRIOR TO APPOINTMENT   . loperamide (IMODIUM) 2 MG capsule TAKE 2 CAPSULES BY MOUTH TWICE DAILY   . nystatin (MYCOSTATIN/NYSTOP) powder Apply 1 application topically 4 (four) times daily.   Marland Kitchen omeprazole (PRILOSEC) 20 MG capsule Take 20 mg by mouth every morning.    . ondansetron (ZOFRAN) 8 MG tablet Take 1 tablet (8 mg total) by mouth 2 (two) times daily as needed for refractory nausea / vomiting. Start on  day 3 after chemotherapy.   Marland Kitchen PROAIR HFA 108 (90 Base) MCG/ACT inhaler INHALE 2 PUFFS BY MOUTH EVERY 6 HOURS AS NEEDED FOR SHORTNESS OF BREATH/WHEEZING.   Marland Kitchen prochlorperazine (COMPAZINE) 10 MG tablet Take 1 tablet (10 mg total) by mouth every 6 (six) hours as needed (NAUSEA).   . Vitamin D, Ergocalciferol, (DRISDOL) 1.25 MG (50000 UT) CAPS capsule Take 50,000 Units by mouth once a week.   Alveda Reasons 20 MG TABS tablet TAKE 1 TABLET BY MOUTH EVERY DAY WITH SUPPER    Facility-Administered Encounter Medications as of 06/10/2019  Medication  . sodium chloride flush (NS) 0.9 % injection 10 mL    ALLERGIES:  No Known Allergies   PHYSICAL EXAM:  ECOG Performance status: 1  There were no vitals filed for this visit. There were no vitals filed for this visit.  Physical Exam Vitals reviewed.  Constitutional:      Appearance: Normal appearance.  Cardiovascular:     Rate and Rhythm: Normal rate and regular rhythm.     Heart sounds: Normal heart sounds.  Pulmonary:     Effort: Pulmonary effort is normal.     Breath sounds: Normal breath sounds.  Abdominal:     General: There is no distension.     Palpations: Abdomen is soft. There is no mass.  Musculoskeletal:        General: No swelling.  Skin:    General: Skin is warm.  Neurological:     General: No focal deficit present.     Mental Status: He  is alert and oriented to person, place, and time.  Psychiatric:        Mood and Affect: Mood normal.        Behavior: Behavior normal.      LABORATORY DATA:  I have reviewed the labs as listed.  CBC    Component Value Date/Time   WBC 5.4 06/10/2019 0752   RBC 4.33 06/10/2019 0752   HGB 14.1 06/10/2019 0752   HCT 44.2 06/10/2019 0752   PLT 194 06/10/2019 0752   MCV 102.1 (H) 06/10/2019 0752   MCH 32.6 06/10/2019 0752   MCHC 31.9 06/10/2019 0752   RDW 16.1 (H) 06/10/2019 0752   LYMPHSABS 1.5 06/10/2019 0752   MONOABS 0.6 06/10/2019 0752   EOSABS 0.3 06/10/2019 0752   BASOSABS 0.1  06/10/2019 0752   CMP Latest Ref Rng & Units 06/10/2019 05/27/2019 05/13/2019  Glucose 70 - 99 mg/dL 132(H) 124(H) 113(H)  BUN 8 - 23 mg/dL 8 7(L) 8  Creatinine 0.61 - 1.24 mg/dL 0.94 0.77 0.95  Sodium 135 - 145 mmol/L 136 135 136  Potassium 3.5 - 5.1 mmol/L 4.3 3.9 4.5  Chloride 98 - 111 mmol/L 95(L) 95(L) 98  CO2 22 - 32 mmol/L '28 28 28  ' Calcium 8.9 - 10.3 mg/dL 9.0 8.8(L) 9.4  Total Protein 6.5 - 8.1 g/dL 7.4 6.9 7.2  Total Bilirubin 0.3 - 1.2 mg/dL 0.7 0.8 0.8  Alkaline Phos 38 - 126 U/L 133(H) 134(H) 115  AST 15 - 41 U/L '17 16 18  ' ALT 0 - 44 U/L '10 17 11       ' DIAGNOSTIC IMAGING:  I have reviewed the scans.   I have reviewed Venita Lick LPN's note and agree with the documentation.  I personally performed a face-to-face visit, made revisions and my assessment and plan is as follows.    ASSESSMENT & PLAN:   Malignant neoplasm of transverse colon ([place]) 1 metastatic colon cancer, K-ras mutation positive, MSI stable: -Maintenance 5-FU/leucovorin and bevacizumab started on 05/22/2018. -CT CAP reviewed by me on 03/01/2019 showed peritoneal/omental disease which is stable. -CEA was 11.6 on 04/28/2019.  LFTs are normal.  White count and platelets are normal. -He will proceed with his treatment today.  We will repeat scans in 4 weeks.  2.  Pulmonary embolism: -Incidental PE on CT on 05/20/2018.  Continue Xarelto without any bleeding issues.  3.  Peripheral neuropathy: -Numbness in the feet has been stable from prior oxaliplatin therapy.  4.  Diarrhea: -Continue Lomotil 2 to 3 tablets/day.  Diarrhea is controlled on this regimen.  5.  Weight loss: -He lost 10 pounds in the last 2 months.  He reports decreased appetite.  He is drinking 2 cans of boost per day.  We will closely monitor.  6.  Multiple falls: -History of multiple falls since he had a head injury many years ago.  He does not have any warning signs prior to the fall. -Brain MRI on 02/12/2019 did not reveal any major  pathology.     Orders placed this encounter:  No orders of the defined types were placed in this encounter.     Derek Jack, MD [place] [phone]

## 2019-06-11 ENCOUNTER — Encounter (HOSPITAL_COMMUNITY): Payer: Self-pay | Admitting: *Deleted

## 2019-06-11 ENCOUNTER — Inpatient Hospital Stay (HOSPITAL_COMMUNITY): Payer: Medicaid Other

## 2019-06-11 DIAGNOSIS — Z5111 Encounter for antineoplastic chemotherapy: Secondary | ICD-10-CM | POA: Diagnosis not present

## 2019-06-11 MED ORDER — HEPARIN SOD (PORK) LOCK FLUSH 100 UNIT/ML IV SOLN
500.0000 [IU] | Freq: Once | INTRAVENOUS | Status: AC
  Administered 2019-06-11: 500 [IU] via INTRAVENOUS

## 2019-06-11 MED ORDER — SODIUM CHLORIDE 0.9% FLUSH
10.0000 mL | INTRAVENOUS | Status: DC | PRN
  Administered 2019-06-11: 10:00:00 10 mL via INTRAVENOUS

## 2019-06-11 NOTE — Progress Notes (Signed)
Patient arrived at cancer center with pump in hand.  He has stopped pump and it is confirmed that it is no longer running. There is 192.54ml remaining in the pump.  Patient's port accessed, flushed with normal saline and heparin (see MAR) and deaccessed.  Patient discharged via wheelchair from clinic.  Per Dr. Delton Coombes, patient is not to complete this treatment, he will have scans coming up and then will follow up with Dr. Delton Coombes as scheduled.

## 2019-06-11 NOTE — Progress Notes (Signed)
Patient called clinic this morning to advise that his needle became disconnected sometime in the night.  I advised him to shut off his pump and he states that was the first thing he had done.  I advised him to wash his sheets/clothes separately in the wash.  I asked if he could come back in today for [place] to re-access and start his chemotherapy again and he said that he may not be able to find a ride here today but will be here tomorrow at the latest.  I advised if he can come today that would be best.  He said he would try.  Pharmacy and provider aware.

## 2019-06-12 ENCOUNTER — Encounter (HOSPITAL_COMMUNITY): Payer: Medicaid Other

## 2019-06-21 NOTE — Assessment & Plan Note (Signed)
1 metastatic colon cancer, K-ras mutation positive, MSI stable: -Maintenance 5-FU/leucovorin and bevacizumab started on 05/22/2018. -CT CAP reviewed by me on 03/01/2019 showed peritoneal/omental disease which is stable. -CEA was 11.6 on 04/28/2019.  LFTs are normal.  White count and platelets are normal. -He will proceed with his treatment today.  We will repeat scans in 4 weeks.  2.  Pulmonary embolism: -Incidental PE on CT on 05/20/2018.  Continue Xarelto without any bleeding issues.  3.  Peripheral neuropathy: -Numbness in the feet has been stable from prior oxaliplatin therapy.  4.  Diarrhea: -Continue Lomotil 2 to 3 tablets/day.  Diarrhea is controlled on this regimen.  5.  Weight loss: -He lost 10 pounds in the last 2 months.  He reports decreased appetite.  He is drinking 2 cans of boost per day.  We will closely monitor.  6.  Multiple falls: -History of multiple falls since he had a head injury many years ago.  He does not have any warning signs prior to the fall. -Brain MRI on 02/12/2019 did not reveal any major pathology.

## 2019-06-23 NOTE — Progress Notes (Signed)
[place], [place] [address]   CLINIC:  Medical Oncology/Hematology  PCP:  Erik [name], [facility]. / [place] [address] [phone]   REASON FOR VISIT:  Follow-up for metastatic colon cancer   CURRENT THERAPY: Chemotherapy with 5-FU and bevacizumab.  BRIEF ONCOLOGIC HISTORY:  Oncology History Overview Note  Stage IIIC ([id number]) adenocarcinoma of transverse colon, diagnosed on colonoscopy by Dr. Oneida Alar on 01/12/2015 followed by definitive surgery by Dr. [name] with right hemicolectomy on 02/12/2015.  He then underwent FOLFOX x 12 cycles in the adjuvant setting (03/30/2015- 09/06/2015).   Malignant neoplasm of transverse colon ([place])  01/12/2015 Pathologic Stage   Colon, biopsy, distal transverse - TUBULOVILLOUS ADENOMA WITH HIGH GRADE DYSPLASIA.   01/12/2015 Procedure   Colonoscopy by Dr. Oneida Alar.   01/12/2015 Tumor Marker   CEA: 6.6 (H)    01/18/2015 Imaging   CT abd/pelvis- Apple-core lesion identified in the mid transverse colon without obstruction. No evidence for lymphadenopathy in the gastrohepatic ligament or omentum.  Stable 8 mm hypo attenuating lesion in the left liver, likely a cyst.   02/10/2015 Initial Diagnosis   Adenocarcinoma of transverse colon ([place])   02/12/2015 Definitive Surgery   [name], Extended right hemicolectomy    02/12/2015 Pathology Results   Mucinous adenocarcinoma with penetration of visceral peritoneum, 4/19 lymph nodes for metastatic disease, negative resection margins, with LVI and perineural invasion   03/30/2015 - 09/06/2015 Chemotherapy   FOLFOX x 12 cycles   05/25/2015 Treatment Plan Change   5 FU bolus discontinued for cycle #5   06/08/2015 Treatment Plan Change   Treatment deferred x 1 week   06/15/2015 Treatment Plan Change   5FU CI decreased by 10% and Oxaliplatin reduced by 15% for cycles #6-#11; Oxaliplatin dropped for cycle #12 d/t neuropathy.    10/20/2015  Imaging   CT CAP- Right hemicolectomy without evidence of metastatic disease. 2. Previously measured ground-glass lesion in the left upper lobe has resolved. 3. Probable food debris in the stomach, simulating gastric wall thickening. Please correlate clinically. 4. 6 mm irregular nodular density in the left upper lobe, stable. Continued attention on followup exams is warranted.   01/25/2016 Procedure   Colonoscopy by Dr. Oneida Alar- Non-thrombosed external hemorrhoids found on digital rectal exam. - One 4 mm polyp in the rectum, removed with a cold biopsy forceps. Resected and retrieved. - Congested mucosa in the neo-terminal ileum. Biopsied. - Redundant colon. - Internal hemorrhoids.   01/26/2016 Pathology Results   1. Terminal ileum, biopsy - MILD ACUTE (ACTIVE) ILEITIS. - NO DYSPLASIA OR MALIGNANCY IDENTIFIED - SEE COMMENT. 2. Rectum, polyp(s) - HYPERPLASTIC POLYP (X 1). - NO DYSPLASIA OR MALIGNANCY IDENTIFIED.   04/13/2016 Imaging   CT chest- Stable CT chest. 6 mm irregular nodule anterior left upper lobe is stable. The scattered areas of peribronchovascular micro nodularity in the lungs bilaterally are unchanged.   10/19/2016 Imaging   CT CAP: 1. Status post right hemicolectomy. No findings to suggest metastatic disease in the abdomen or pelvis. 2. Aortic atherosclerosis. 3. Additional incidental findings, as above. Aortic Atherosclerosis (ICD10-I70.0).   01/09/2018 -  Chemotherapy   The patient had palonosetron (ALOXI) injection 0.25 mg, 0.25 mg, Intravenous,  Once, 36 of 38 cycles Administration: 0.25 mg (01/09/2018), 0.25 mg (01/23/2018), 0.25 mg (02/11/2018), 0.25 mg (02/25/2018), 0.25 mg (03/12/2018), 0.25 mg (03/27/2018), 0.25 mg (04/10/2018), 0.25 mg (04/24/2018), 0.25 mg (05/08/2018), 0.25 mg (05/22/2018), 0.25 mg (06/10/2018), 0.25 mg (06/26/2018), 0.25 mg (07/10/2018),  0.25 mg (07/24/2018), 0.25 mg (08/13/2018), 0.25 mg (08/27/2018), 0.25 mg (09/10/2018), 0.25 mg (09/24/2018), 0.25 mg  (10/08/2018), 0.25 mg (11/05/2018), 0.25 mg (11/19/2018), 0.25 mg (12/04/2018), 0.25 mg (12/18/2018), 0.25 mg (01/08/2019), 0.25 mg (01/22/2019), 0.25 mg (02/04/2019), 0.25 mg (02/18/2019), 0.25 mg (03/04/2019), 0.25 mg (03/18/2019), 0.25 mg (04/01/2019), 0.25 mg (04/14/2019), 0.25 mg (04/28/2019), 0.25 mg (05/13/2019), 0.25 mg (05/27/2019), 0.25 mg (06/10/2019), 0.25 mg (06/24/2019) irinotecan (CAMPTOSAR) 420 mg in dextrose 5 % 500 mL chemo infusion, 180 mg/m2 = 420 mg, Intravenous,  Once, 9 of 9 cycles Administration: 420 mg (01/09/2018), 420 mg (01/23/2018), 420 mg (02/11/2018), 420 mg (02/25/2018), 420 mg (03/12/2018), 420 mg (03/27/2018), 420 mg (04/10/2018), 420 mg (04/24/2018), 420 mg (05/08/2018) leucovorin 900 mg in dextrose 5 % 250 mL infusion, 944 mg, Intravenous,  Once, 36 of 38 cycles Administration: 900 mg (01/09/2018), 900 mg (01/23/2018), 900 mg (02/11/2018), 900 mg (02/25/2018), 900 mg (03/12/2018), 900 mg (03/27/2018), 900 mg (04/10/2018), 900 mg (04/24/2018), 900 mg (05/08/2018), 900 mg (06/10/2018), 900 mg (06/26/2018), 900 mg (07/10/2018), 900 mg (07/24/2018), 900 mg (08/13/2018), 900 mg (08/27/2018), 900 mg (09/10/2018), 900 mg (09/24/2018), 900 mg (10/08/2018), 900 mg (11/05/2018), 900 mg (11/19/2018), 900 mg (12/04/2018), 900 mg (12/18/2018), 900 mg (01/08/2019), 900 mg (01/22/2019), 900 mg (02/04/2019), 900 mg (02/18/2019), 900 mg (03/04/2019), 900 mg (03/18/2019), 900 mg (04/01/2019), 900 mg (04/14/2019), 900 mg (04/28/2019), 900 mg (05/13/2019), 900 mg (05/27/2019), 900 mg (06/10/2019), 900 mg (06/24/2019) fluorouracil (ADRUCIL) chemo injection 950 mg, 400 mg/m2 = 950 mg, Intravenous,  Once, 36 of 38 cycles Administration: 950 mg (01/09/2018), 950 mg (01/23/2018), 950 mg (02/11/2018), 950 mg (02/25/2018), 950 mg (03/12/2018), 950 mg (03/27/2018), 950 mg (04/10/2018), 950 mg (04/24/2018), 950 mg (05/08/2018), 950 mg (05/22/2018), 950 mg (06/10/2018), 950 mg (06/26/2018), 950 mg (07/10/2018), 950 mg (07/24/2018), 950 mg (08/13/2018), 950 mg (08/27/2018), 950 mg (09/10/2018), 950  mg (09/24/2018), 950 mg (10/08/2018), 950 mg (11/05/2018), 950 mg (11/19/2018), 950 mg (12/04/2018), 950 mg (12/18/2018), 950 mg (01/08/2019), 950 mg (01/22/2019), 950 mg (02/04/2019), 950 mg (02/18/2019), 950 mg (03/04/2019), 950 mg (03/18/2019), 950 mg (04/01/2019), 950 mg (04/14/2019), 950 mg (04/28/2019), 950 mg (05/13/2019), 950 mg (05/27/2019), 950 mg (06/10/2019), 950 mg (06/24/2019) fluorouracil (ADRUCIL) 5,650 mg in sodium chloride 0.9 % 137 mL chemo infusion, 2,400 mg/m2 = 5,650 mg, Intravenous, 1 Day/Dose, 36 of 38 cycles Administration: 5,650 mg (01/09/2018), 5,650 mg (01/23/2018), 5,650 mg (02/11/2018), 5,650 mg (02/25/2018), 5,650 mg (03/12/2018), 5,650 mg (03/27/2018), 5,650 mg (04/10/2018), 5,650 mg (04/24/2018), 5,650 mg (05/08/2018), 5,650 mg (05/22/2018), 5,650 mg (06/10/2018), 5,650 mg (06/26/2018), 5,650 mg (07/10/2018), 5,650 mg (07/24/2018), 5,650 mg (08/13/2018), 5,650 mg (08/27/2018), 5,650 mg (09/10/2018), 5,650 mg (09/24/2018), 5,650 mg (10/08/2018), 5,650 mg (11/05/2018), 5,650 mg (11/19/2018), 5,650 mg (12/04/2018), 5,650 mg (12/18/2018), 5,650 mg (01/08/2019), 5,650 mg (01/22/2019), 5,650 mg (02/04/2019), 5,650 mg (02/18/2019), 5,650 mg (03/04/2019), 5,650 mg (03/18/2019), 5,650 mg (04/01/2019), 5,650 mg (04/14/2019), 5,650 mg (04/28/2019), 5,650 mg (05/13/2019), 5,650 mg (05/27/2019), 5,650 mg (06/10/2019), 5,650 mg (06/24/2019) bevacizumab-awwb (MVASI) 500 mg in sodium chloride 0.9 % 100 mL chemo infusion, 4.7 mg/kg = 525 mg (100 % of original dose 5 mg/kg), Intravenous,  Once, 1 of 1 cycle Dose modification: 5 mg/kg (original dose 5 mg/kg, Cycle 24) Administration: 500 mg (01/08/2019) bevacizumab-bvzr (ZIRABEV) 500 mg in sodium chloride 0.9 % 100 mL chemo infusion, 5 mg/kg = 500 mg (100 % of original dose 5 mg/kg), Intravenous,  Once, 15 of 17 cycles Dose modification: 5 mg/kg (original dose 5 mg/kg, Cycle 21) Administration: 500 mg (  11/19/2018), 500 mg (12/04/2018), 500 mg (12/18/2018), 500 mg (01/22/2019), 500 mg (02/04/2019), 500 mg  (02/18/2019), 500 mg (03/04/2019), 500 mg (03/18/2019), 500 mg (04/01/2019), 500 mg (04/14/2019), 500 mg (04/28/2019), 500 mg (05/13/2019), 500 mg (05/27/2019), 500 mg (06/10/2019), 500 mg (06/24/2019)  for chemotherapy treatment.    01/23/2018 Cancer Staging   Staging form: Colon and Rectum, AJCC 7th Edition - Pathologic: M1 - Signed by Zoila Shutter, MD on 01/23/2018   Malignant neoplasm of overlapping sites of colon ([place])  12/26/2017 Initial Diagnosis   Cancer of overlapping sites of colon metastatic to intra-abdominal lymph node ([place])   01/09/2018 -  Chemotherapy   The patient had palonosetron (ALOXI) injection 0.25 mg, 0.25 mg, Intravenous,  Once, 13 of 14 cycles Administration: 0.25 mg (01/09/2018), 0.25 mg (01/23/2018), 0.25 mg (02/11/2018), 0.25 mg (02/25/2018), 0.25 mg (03/12/2018), 0.25 mg (03/27/2018), 0.25 mg (04/10/2018), 0.25 mg (04/24/2018), 0.25 mg (05/08/2018), 0.25 mg (05/22/2018), 0.25 mg (06/10/2018), 0.25 mg (06/26/2018), 0.25 mg (07/10/2018) irinotecan (CAMPTOSAR) 420 mg in dextrose 5 % 500 mL chemo infusion, 180 mg/m2 = 420 mg, Intravenous,  Once, 9 of 9 cycles Administration: 420 mg (01/09/2018), 420 mg (01/23/2018), 420 mg (02/11/2018), 420 mg (02/25/2018), 420 mg (03/12/2018), 420 mg (03/27/2018), 420 mg (04/10/2018), 420 mg (04/24/2018), 420 mg (05/08/2018) leucovorin 900 mg in dextrose 5 % 250 mL infusion, 944 mg, Intravenous,  Once, 13 of 14 cycles Administration: 900 mg (01/09/2018), 900 mg (01/23/2018), 900 mg (02/11/2018), 900 mg (02/25/2018), 900 mg (03/12/2018), 900 mg (03/27/2018), 900 mg (04/10/2018), 900 mg (04/24/2018), 900 mg (05/08/2018), 900 mg (06/10/2018), 900 mg (06/26/2018), 900 mg (07/10/2018) fluorouracil (ADRUCIL) chemo injection 950 mg, 400 mg/m2 = 950 mg, Intravenous,  Once, 13 of 14 cycles Administration: 950 mg (01/09/2018), 950 mg (01/23/2018), 950 mg (02/11/2018), 950 mg (02/25/2018), 950 mg (03/12/2018), 950 mg (03/27/2018), 950 mg (04/10/2018), 950 mg (04/24/2018), 950 mg (05/08/2018), 950 mg (05/22/2018), 950 mg  (06/10/2018), 950 mg (06/26/2018), 950 mg (07/10/2018) fluorouracil (ADRUCIL) 5,650 mg in sodium chloride 0.9 % 137 mL chemo infusion, 2,400 mg/m2 = 5,650 mg, Intravenous, 1 Day/Dose, 13 of 14 cycles Administration: 5,650 mg (01/09/2018), 5,650 mg (01/23/2018), 5,650 mg (02/11/2018), 5,650 mg (02/25/2018), 5,650 mg (03/12/2018), 5,650 mg (03/27/2018), 5,650 mg (04/10/2018), 5,650 mg (04/24/2018), 5,650 mg (05/08/2018), 5,650 mg (05/22/2018), 5,650 mg (06/10/2018), 5,650 mg (06/26/2018), 5,650 mg (07/10/2018)  for chemotherapy treatment.    Colon cancer metastasized to multiple sites [facility])  01/16/2018 Initial Diagnosis   Colon cancer metastasized to multiple sites [facility])   01/23/2018 - 02/10/2018 Chemotherapy   The patient had panitumumab (VECTIBIX) 600 mg in sodium chloride 0.9 % 100 mL chemo infusion, 640 mg, Intravenous,  Once, 1 of 3 cycles Administration: 600 mg (01/23/2018)  for chemotherapy treatment.    03/12/2018 - 10/21/2018 Chemotherapy   The patient had bevacizumab (AVASTIN) 500 mg in sodium chloride 0.9 % 100 mL chemo infusion, 525 mg, Intravenous,  Once, 15 of 22 cycles Administration: 500 mg (03/12/2018), 500 mg (03/27/2018), 500 mg (04/10/2018), 500 mg (04/24/2018), 500 mg (06/10/2018), 500 mg (05/08/2018), 500 mg (05/22/2018), 500 mg (06/26/2018), 500 mg (07/10/2018), 500 mg (07/24/2018), 500 mg (08/13/2018), 500 mg (08/27/2018), 500 mg (09/10/2018), 500 mg (09/24/2018), 500 mg (10/08/2018)  for chemotherapy treatment.      CANCER STAGING: Cancer Staging Malignant neoplasm of transverse colon [facility]) Staging form: Colon and Rectum, AJCC 7th Edition - Pathologic stage from 03/04/2015: Stage IIIC (T4a, N2a, cM0) - Signed by Baird Cancer, PA-C on 03/04/2015 - Pathologic: M1 - Signed by  Higgs, Mathis Dad, MD on 01/23/2018   INTERVAL HISTORY:  Erik [name] 64 y.o. male returns for routine follow-up and consideration for next cycle of chemotherapy.   Due for cycle #36 of maintenance leucovorin and bevacizumab today.   Overall, he  tells me he has been feeling pretty well. He notes that he fells recently, this past Friday.   Overall, he feels ready for next cycle of chemo today.    REVIEW OF SYSTEMS:  Review of Systems  Constitutional: Positive for fatigue. Negative for appetite change, chills and fever.       Ambulating with a cane  HENT:   Positive for nosebleeds. Negative for lump/mass, mouth sores, sore throat and trouble swallowing.   Eyes: Negative for eye problems.  Respiratory: Positive for cough. Negative for chest tightness, shortness of breath and wheezing.        Sputum production  Cardiovascular: Negative for chest pain and palpitations.  Gastrointestinal: Positive for diarrhea. Negative for abdominal pain, constipation, nausea and vomiting.  Genitourinary: Positive for difficulty urinating (for the last week). Negative for bladder incontinence, dysuria, frequency and hematuria.   Musculoskeletal: Negative for arthralgias, back pain, flank pain and myalgias.  Skin: Negative for rash.  Neurological: Positive for numbness (in feet; worse than prior visit). Negative for dizziness, headaches and light-headedness.  Hematological: Does not bruise/bleed easily.  Psychiatric/Behavioral: Negative for depression. The patient is not nervous/anxious.     PAST MEDICAL/SURGICAL HISTORY:  Past Medical History:  Diagnosis Date  . Abnormal stress echocardiogram   . Adenocarcinoma of transverse colon ([place]) 02/10/2015  . Alcohol abuse    Heavy Use up until 2010  . Anxiety   . Blood transfusion without reported diagnosis   . Cancer of overlapping sites of colon metastatic to intra-abdominal lymph node ([place]) 12/26/2017  . COPD (chronic obstructive pulmonary disease) ([place])   . Depression   . Emphysema of lung ([place])   . GERD (gastroesophageal reflux disease)   . Head trauma 2001   closed head injury; coma for 4 weeks  . Hypercholesterolemia   . Hypertension   . ING HERN W/GANGREN RECUR UNILAT/UNSPEC ING HERN  04/21/2009   Qualifier: Diagnosis of  By: Verl Blalock, MD, Delanna Ahmadi PTSD (post-traumatic stress disorder)   . Pulmonary nodule 06/14/2016  . SAH (subarachnoid hemorrhage) ([place]) 08/31/2012  . SDH (subdural hematoma) ([place]) 08/31/2012   Past Surgical History:  Procedure Laterality Date  . BIOPSY  01/25/2016   Procedure: BIOPSY;  Surgeon: Danie Binder, MD;  [facility];  Service: Endoscopy;;  ileum;   . cardiac cath    . COLONOSCOPY  2011   Dr. Oneida Alar: multiple adenomas and hyperplastic polyps  . COLONOSCOPY WITH PROPOFOL N/A 01/12/2015   Procedure: COLONOSCOPY WITH PROPOFOL;  Surgeon: Danie Binder, MD;  [facility];  Service: Endoscopy;  Laterality: N/A;  1030  . COLONOSCOPY WITH PROPOFOL N/A 01/25/2016   Procedure: COLONOSCOPY WITH PROPOFOL;  Surgeon: Danie Binder, MD;  [facility];  Service: Endoscopy;  Laterality: N/A;  1230  . CRANIOTOMY  2001  . ESOPHAGOGASTRODUODENOSCOPY (EGD) WITH PROPOFOL N/A 01/12/2015   Procedure: ESOPHAGOGASTRODUODENOSCOPY (EGD) WITH PROPOFOL;  Surgeon: Danie Binder, MD;  [facility];  Service: Endoscopy;  Laterality: N/A;  . head injury surgery    . HERNIA REPAIR Right 2012   Inguinal- Forestine Na  . KIDNEY SURGERY     >30 years ago  . LAPAROSCOPIC RIGHT HEMI COLECTOMY Left 02/10/2015  Procedure: LAPAROSCOPIC THEN OPEN RIGHT HEMI COLECTOMY;  Surgeon: [name], MD;  Location: [facility];  Service: General;  Laterality: Left;  . LAPAROTOMY N/A 12/19/2017   Procedure: EXPLORATORY LAPAROTOMY;  Surgeon: Aviva Signs, MD;  Location: [facility];  Service: General;  Laterality: N/A;  . OPEN REDUCTION INTERNAL FIXATION (ORIF) DISTAL RADIAL FRACTURE Right 10/18/2018   Procedure: OPEN REDUCTION INTERNAL FIXATION (ORIF) RIGHT DISTAL RADIAL FRACTURE;  Surgeon: [name], MD;  Location: [place];  Service: Orthopedics;  Laterality: Right;  block in preop  . OPEN REDUCTION INTERNAL FIXATION (ORIF) DISTAL  RADIAL FRACTURE Right 12/20/2018   Procedure: OPEN REDUCTION INTERNAL FIXATION (ORIF) DISTAL RADIUS AND ULNA FRACTURE;  Surgeon: [name], MD;  Location: [facility];  Service: Orthopedics;  Laterality: Right;  block  . POLYPECTOMY  01/25/2016   Procedure: POLYPECTOMY;  Surgeon: Danie Binder, MD;  [facility];  Service: Endoscopy;;  colon  . PORT-A-CATH REMOVAL N/A 02/26/2017   Procedure: MINOR REMOVAL PORT-A-CATH;  Surgeon: Aviva Signs, MD;  Location: [facility];  Service: General;  Laterality: N/A;  Pt to arrive at [place] N/A 03/24/2015   Procedure: INSERTION PORT-A-CATH;  Surgeon: Jules Husbands, MD;  Location: [facility];  Service: General;  Laterality: N/A;  . PORTACATH PLACEMENT Left 01/07/2018   Procedure: INSERTION PORT A CATH (ATTACHED CATHETER IN LEFT SUBCLAVIAN);  Surgeon: Aviva Signs, MD;  Location: [facility];  Service: General;  Laterality: Left;    SOCIAL HISTORY:  Social History   Socioeconomic History  . Marital status: Divorced    Spouse name: Not on file  . Number of children: 2  . Years of education: 89  . Highest education level: Not on file  Occupational History  . Occupation: disabled/unemployed  . Occupation: unemployed/Mediicaid only    Comment: NO INCOME  Tobacco Use  . Smoking status: Former Smoker    Packs/day: 0.10    Years: 30.00    Pack years: 3.00    Types: Cigarettes    Start date: 02/06/1973    Quit date: 12/09/2017    Years since quitting: 1.5  . Smokeless tobacco: Never Used  . Tobacco comment: Quit on 12/09/2017  Substance and Sexual Activity  . Alcohol use: Yes    Alcohol/week: 0.0 standard drinks    Comment: beer occ, history of ETOH abuse in remote past.   . Drug use: No  . Sexual activity: Never    Partners: Female    Birth control/protection: None  Other Topics Concern  . Not on file  Social History Narrative   Single   Lives alone   Turned down for disability   Social Determinants of  Health   Financial Resource Strain:   . Difficulty of Paying Living Expenses:   Food Insecurity:   . Worried About Charity fundraiser in the Last Year:   . Arboriculturist in the Last Year:   Transportation Needs:   . Film/video editor (Medical):   [place] Kitchen Lack of Transportation (Non-Medical):   Physical Activity:   . Days of Exercise per Week:   . Minutes of Exercise per Session:   Stress:   . Feeling of Stress :   Social Connections:   . Frequency of Communication with Friends and Family:   . Frequency of Social Gatherings with Friends and Family:   . Attends Religious Services:   . Active Member of Clubs or Organizations:   . Attends Club or  Organization Meetings:   Marland Kitchen Marital Status:   Intimate Partner Violence:   . Fear of Current or Ex-Partner:   . Emotionally Abused:   Mar[place] Kitchen Physically Abused:   . Sexually Abused:     FAMILY HISTORY:  Family History  Problem Relation Age of Onset  . Pulmonary embolism Mother   . Breast cancer Mother   . Cancer Mother        breast cancer  . Arthritis Mother   . Heart disease Father   . Cancer Father 25       Leukemia  . Cancer Paternal Grandfather        Lung  . Ataxia Neg Hx   . Chorea Neg Hx   . Dementia Neg Hx   . Mental retardation Neg Hx   . Migraines Neg Hx   . Multiple sclerosis Neg Hx   . Neurofibromatosis Neg Hx   . Neuropathy Neg Hx   . Parkinsonism Neg Hx   . Seizures Neg Hx   . Stroke Neg Hx   . Colon cancer Neg Hx     CURRENT MEDICATIONS:  Current Outpatient Medications  Medication Sig Dispense Refill  . amitriptyline (ELAVIL) 25 MG tablet Take 1 tablet (25 mg total) by mouth at bedtime. 90 tablet 3  . atorvastatin (LIPITOR) 20 MG tablet TAKE 1 TABLET BY MOUTH AT BEDTIME (Patient taking differently: Take 20 mg by mouth daily. ) 90 tablet 3  . buPROPion (WELLBUTRIN SR) 150 MG 12 hr tablet Take 150 mg by mouth every morning.     . busPIRone (BUSPAR) 10 MG tablet Take 10 mg by mouth 2 (two) times daily.    .  fluorouracil CALGB 50277 in sodium chloride 0.9 % 150 mL Inject 5,650 mg into the vein. Over 46 hours    . FLUoxetine (PROZAC) 20 MG capsule TAKE 1 CAPSULE BY MOUTH 2 TIMES A DAY (Patient taking differently: Take 20 mg by mouth 2 (two) times daily. ) 60 capsule 0  . gabapentin (NEURONTIN) 300 MG capsule TAKE 1 CAPSULE BY MOUTH THREE TIMES A DAY 120 capsule 6  . IRINOTECAN HCL IV Inject 420 mg into the vein every 14 (fourteen) days.    Marland Kitchen LEUCOVORIN CALCIUM IV Inject 944 mg into the vein every 14 (fourteen) days.    Marland Kitchen loperamide (IMODIUM) 2 MG capsule TAKE 2 CAPSULES BY MOUTH TWICE DAILY 60 capsule 2  . nystatin (MYCOSTATIN/NYSTOP) powder Apply 1 application topically 4 (four) times daily. 60 g 0  . omeprazole (PRILOSEC) 20 MG capsule Take 20 mg by mouth every morning.     . Vitamin D, Ergocalciferol, (DRISDOL) 1.25 MG (50000 UT) CAPS capsule Take 50,000 Units by mouth once a week.    Alveda Reasons 20 MG TABS tablet TAKE 1 TABLET BY MOUTH EVERY DAY WITH SUPPER 30 tablet 6  . lidocaine-prilocaine (EMLA) cream APPLY A SMALL AMOUNT OVER PORT SITE AND COVER WITH PLASTIC WRAP ONE HOUR PRIOR TO APPOINTMENT (Patient not taking: Reported on 06/24/2019) 30 g 0  . ondansetron (ZOFRAN) 8 MG tablet Take 1 tablet (8 mg total) by mouth 2 (two) times daily as needed for refractory nausea / vomiting. Start on day 3 after chemotherapy. (Patient not taking: Reported on 06/24/2019) 30 tablet 1  . PROAIR HFA 108 (90 Base) MCG/ACT inhaler INHALE 2 PUFFS BY MOUTH EVERY 6 HOURS AS NEEDED FOR SHORTNESS OF BREATH/WHEEZING. (Patient not taking: Reported on 06/24/2019) 18 each 2  . prochlorperazine (COMPAZINE) 10 MG tablet Take 1 tablet (10  mg total) by mouth every 6 (six) hours as needed (NAUSEA). (Patient not taking: Reported on 06/24/2019) 30 tablet 1  . tamsulosin (FLOMAX) 0.4 MG CAPS capsule TAKE 1 CAPSULE (0.4 MG TOTAL) BY MOUTH DAILY. 30 capsule 1   No current facility-administered medications for this visit.    Facility-Administered Medications Ordered in Other Visits  Medication Dose Route Frequency Provider Last Rate Last Admin  . sodium chloride flush (NS) 0.9 % injection 10 mL  10 mL Intracatheter PRN Derek Jack, MD   10 mL at 11/07/18 1322    ALLERGIES:  No Known Allergies  PHYSICAL EXAM:  Performance status (ECOG): 1 - Symptomatic but completely ambulatory  Vitals:   06/24/19 0921  BP: (!) 152/87  Pulse: (!) 114  Resp: 18  Temp: (!) 96.9 F (36.1 C)  SpO2: 97%   Wt Readings from Last 3 Encounters:  06/24/19 213 lb 3.2 oz (96.7 kg)  06/10/19 211 lb 9.6 oz (96 kg)  05/27/19 212 lb 9.6 oz (96.4 kg)   Physical Exam  Constitutional: He is oriented to person, place, and time. He appears well-developed.  HENT:  Head: Normocephalic.  Cardiovascular: Normal rate and regular rhythm.  Pulmonary/Chest: Effort normal and breath sounds normal.  Abdominal: Soft. He exhibits no distension.  Neurological: He is alert and oriented to person, place, and time.  Vitals reviewed.   LABORATORY DATA:  I have reviewed the labs as listed.  CBC Latest Ref Rng & Units 06/24/2019 06/10/2019 05/27/2019  WBC 4.0 - 10.5 K/uL 9.3 5.4 5.0  Hemoglobin 13.0 - 17.0 g/dL 13.9 14.1 13.4  Hematocrit 39.0 - 52.0 % 43.5 44.2 41.6  Platelets 150 - 400 K/uL 195 194 142(L)   CMP Latest Ref Rng & Units 06/24/2019 06/10/2019 05/27/2019  Glucose 70 - 99 mg/dL 118(H) 132(H) 124(H)  BUN 8 - 23 mg/dL 6(L) 8 7(L)  Creatinine 0.61 - 1.24 mg/dL 0.74 0.94 0.77  Sodium 135 - 145 mmol/L 137 136 135  Potassium 3.5 - 5.1 mmol/L 4.4 4.3 3.9  Chloride 98 - 111 mmol/L 95(L) 95(L) 95(L)  CO2 22 - 32 mmol/L _0 Calcium 8.9 - 10.3 mg/dL 8.9 9.0 8.8(L)  Total Protein 6.5 - 8.1 g/dL 6.9 7.4 6.9  Total Bilirubin 0.3 - 1.2 mg/dL 0.6 0.7 0.8  Alkaline Phos 38 - 126 U/L 127(H) 133(H) 134(H)  AST 15 - 41 U/L _1 ALT 0 - 44 U/L _2 DIAGNOSTIC IMAGING:  I have independently reviewed the scans and  discussed with the patient.  ASSESSMENT & PLAN:  Malignant neoplasm of transverse colon ([place]) 1.  Metastatic colon cancer, K-ras mutation positive, MSI stable: -Maintenance 5-FU/leucovorin and bevacizumab started on 05/22/2018. -CT CAP on 03/01/2019 showed peritoneal/omental disease which is stable. -CEA was 11.6 on 04/28/2019.  I reviewed labs which are within normal limits. -We will proceed with next cycle of chemotherapy today. -I will see him back in 2 weeks for follow-up.  I plan to repeat a CEA and CT CAP prior to next visit.  2.  Pulmonary embolism: -Incidental PE on CT on 05/20/2018.  Continue Xarelto without any bleeding issues.  3.  Diarrhea: -Continue Lomotil 2 to 3 tablets daily.  Well-controlled.  4.  Difficulty urinating: -He reports difficulty starting the stream for the last few days.  We will start him on Flomax 0.4 mg at bedtime.  5.  Multiple falls: -History of multiple falls since he had head injury many years ago.  Brain MRI on 02/12/2019 did not show any major pathology. -He reportedly fell last week on his knees.  He is following up with Dr. Tomi Likens neurology.  6.  Weight loss: -Weight has been stable for the past 3 visits.  He will continue with 2 cans of boost per day.  7.  Peripheral neuropathy: -Neuropathy in the feet from prior oxaliplatin which is stable.    Orders placed this encounter:  Orders Placed This Encounter  Procedures  . CT Abdomen Pelvis W Contrast  . CT Chest W Contrast  . CBC with Differential  . Comprehensive metabolic panel  . Magnesium  . CEA      Derek Jack, MD, 06/24/19 7:52 PM  [place] [phone]   I, Jacqualyn Posey, am acting as a scribe for Dr. Sanda Linger.  I, Derek Jack MD, have reviewed the above documentation for accuracy and completeness, and I agree with the above.

## 2019-06-24 ENCOUNTER — Inpatient Hospital Stay (HOSPITAL_COMMUNITY): Payer: Medicaid Other

## 2019-06-24 ENCOUNTER — Other Ambulatory Visit: Payer: Self-pay

## 2019-06-24 ENCOUNTER — Other Ambulatory Visit (HOSPITAL_COMMUNITY): Payer: Self-pay | Admitting: Hematology

## 2019-06-24 ENCOUNTER — Inpatient Hospital Stay (HOSPITAL_BASED_OUTPATIENT_CLINIC_OR_DEPARTMENT_OTHER): Payer: Medicaid Other | Admitting: Hematology

## 2019-06-24 VITALS — BP 152/87 | HR 114 | Temp 96.9°F | Resp 18 | Wt 213.2 lb

## 2019-06-24 VITALS — BP 145/88 | HR 99 | Temp 97.1°F | Resp 18

## 2019-06-24 DIAGNOSIS — C188 Malignant neoplasm of overlapping sites of colon: Secondary | ICD-10-CM

## 2019-06-24 DIAGNOSIS — C184 Malignant neoplasm of transverse colon: Secondary | ICD-10-CM

## 2019-06-24 DIAGNOSIS — Z5111 Encounter for antineoplastic chemotherapy: Secondary | ICD-10-CM | POA: Diagnosis not present

## 2019-06-24 LAB — CBC WITH DIFFERENTIAL/PLATELET
Abs Immature Granulocytes: 0.05 10*3/uL (ref 0.00–0.07)
Basophils Absolute: 0.1 10*3/uL (ref 0.0–0.1)
Basophils Relative: 1 %
Eosinophils Absolute: 0.1 10*3/uL (ref 0.0–0.5)
Eosinophils Relative: 1 %
HCT: 43.5 % (ref 39.0–52.0)
Hemoglobin: 13.9 g/dL (ref 13.0–17.0)
Immature Granulocytes: 1 %
Lymphocytes Relative: 12 %
Lymphs Abs: 1.1 10*3/uL (ref 0.7–4.0)
MCH: 32.9 pg (ref 26.0–34.0)
MCHC: 32 g/dL (ref 30.0–36.0)
MCV: 103.1 fL — ABNORMAL HIGH (ref 80.0–100.0)
Monocytes Absolute: 0.9 10*3/uL (ref 0.1–1.0)
Monocytes Relative: 9 %
Neutro Abs: 7.1 10*3/uL (ref 1.7–7.7)
Neutrophils Relative %: 76 %
Platelets: 195 10*3/uL (ref 150–400)
RBC: 4.22 MIL/uL (ref 4.22–5.81)
RDW: 16.7 % — ABNORMAL HIGH (ref 11.5–15.5)
WBC: 9.3 10*3/uL (ref 4.0–10.5)
nRBC: 0 % (ref 0.0–0.2)

## 2019-06-24 LAB — COMPREHENSIVE METABOLIC PANEL
ALT: 10 U/L (ref 0–44)
AST: 16 U/L (ref 15–41)
Albumin: 3.2 g/dL — ABNORMAL LOW (ref 3.5–5.0)
Alkaline Phosphatase: 127 U/L — ABNORMAL HIGH (ref 38–126)
Anion gap: 13 (ref 5–15)
BUN: 6 mg/dL — ABNORMAL LOW (ref 8–23)
CO2: 29 mmol/L (ref 22–32)
Calcium: 8.9 mg/dL (ref 8.9–10.3)
Chloride: 95 mmol/L — ABNORMAL LOW (ref 98–111)
Creatinine, Ser: 0.74 mg/dL (ref 0.61–1.24)
GFR calc Af Amer: >60 mL/min (ref >60)
GFR calc non Af Amer: >60 mL/min (ref >60)
Glucose, Bld: 118 mg/dL — ABNORMAL HIGH (ref 70–99)
Potassium: 4.4 mmol/L (ref 3.5–5.1)
Sodium: 137 mmol/L (ref 135–145)
Total Bilirubin: 0.6 mg/dL (ref 0.3–1.2)
Total Protein: 6.9 g/dL (ref 6.5–8.1)

## 2019-06-24 LAB — MAGNESIUM: Magnesium: 1.9 mg/dL (ref 1.7–2.4)

## 2019-06-24 MED ORDER — FLUOROURACIL CHEMO INJECTION 2.5 GM/50ML
400.0000 mg/m2 | Freq: Once | INTRAVENOUS | Status: AC
  Administered 2019-06-24: 950 mg via INTRAVENOUS
  Filled 2019-06-24: qty 19

## 2019-06-24 MED ORDER — SODIUM CHLORIDE 0.9 % IV SOLN
900.0000 mg | Freq: Once | INTRAVENOUS | Status: AC
  Administered 2019-06-24: 900 mg via INTRAVENOUS
  Filled 2019-06-24: qty 45

## 2019-06-24 MED ORDER — ATROPINE SULFATE 1 MG/ML IJ SOLN
0.5000 mg | Freq: Once | INTRAMUSCULAR | Status: AC
  Administered 2019-06-24: 0.5 mg via INTRAVENOUS
  Filled 2019-06-24: qty 1

## 2019-06-24 MED ORDER — SODIUM CHLORIDE 0.9 % IV SOLN: Freq: Once | INTRAVENOUS | Status: AC

## 2019-06-24 MED ORDER — SODIUM CHLORIDE 0.9 % IV SOLN
2400.0000 mg/m2 | INTRAVENOUS | Status: DC
  Administered 2019-06-24: 5650 mg via INTRAVENOUS
  Filled 2019-06-24: qty 113

## 2019-06-24 MED ORDER — TAMSULOSIN HCL 0.4 MG PO CAPS: 0.4000 mg | ORAL_CAPSULE | Freq: Every day | ORAL | 1 refills | Status: DC

## 2019-06-24 MED ORDER — SODIUM CHLORIDE 0.9% FLUSH
10.0000 mL | INTRAVENOUS | Status: DC | PRN
  Administered 2019-06-24: 10 mL

## 2019-06-24 MED ORDER — SODIUM CHLORIDE 0.9 % IV SOLN
5.0000 mg/kg | Freq: Once | INTRAVENOUS | Status: AC
  Administered 2019-06-24: 500 mg via INTRAVENOUS
  Filled 2019-06-24: qty 16

## 2019-06-24 MED ORDER — PALONOSETRON HCL INJECTION 0.25 MG/5ML
0.2500 mg | Freq: Once | INTRAVENOUS | Status: AC
  Administered 2019-06-24: 0.25 mg via INTRAVENOUS
  Filled 2019-06-24: qty 5

## 2019-06-24 MED ORDER — SODIUM CHLORIDE 0.9 % IV SOLN
10.0000 mg | Freq: Once | INTRAVENOUS | Status: AC
  Administered 2019-06-24: 10 mg via INTRAVENOUS
  Filled 2019-06-24: qty 10

## 2019-06-24 NOTE — Assessment & Plan Note (Signed)
1.  Metastatic colon cancer, K-ras mutation positive, MSI stable: -Maintenance 5-FU/leucovorin and bevacizumab started on 05/22/2018. -CT CAP on 03/01/2019 showed peritoneal/omental disease which is stable. -CEA was 11.6 on 04/28/2019.  I reviewed labs which are within normal limits. -We will proceed with next cycle of chemotherapy today. -I will see him back in 2 weeks for follow-up.  I plan to repeat a CEA and CT CAP prior to next visit.  2.  Pulmonary embolism: -Incidental PE on CT on 05/20/2018.  Continue Xarelto without any bleeding issues.  3.  Diarrhea: -Continue Lomotil 2 to 3 tablets daily.  Well-controlled.  4.  Difficulty urinating: -He reports difficulty starting the stream for the last few days.  We will start him on Flomax 0.4 mg at bedtime.  5.  Multiple falls: -History of multiple falls since he had head injury many years ago.  Brain MRI on 02/12/2019 did not show any major pathology. -He reportedly fell last week on his knees.  He is following up with Dr. Tomi Likens neurology.  6.  Weight loss: -Weight has been stable for the past 3 visits.  He will continue with 2 cans of boost per day.  7.  Peripheral neuropathy: -Neuropathy in the feet from prior oxaliplatin which is stable.

## 2019-06-24 NOTE — Patient Instructions (Addendum)
[place] at [facility] Discharge Instructions  You were seen today by Dr. Delton Coombes. He went over your recent results. He will see you back in 2 weeks for labs and follow up. Take Flomax at bedtime as discussed for urinary issues.    Thank you for choosing [place] at [facility] to provide your oncology and hematology care.  To afford each patient quality time with our provider, please arrive at least 15 minutes before your scheduled appointment time.   If you have a lab appointment with the [facility] please come in thru the  Main Entrance and check in at the main information desk  You need to re-schedule your appointment should you arrive 10 or more minutes late.  We strive to give you quality time with our providers, and arriving late affects you and other patients whose appointments are after yours.  Also, if you no show three or more times for appointments you may be dismissed from the clinic at the providers discretion.     Again, thank you for choosing [facility].  Our hope is that these requests will decrease the amount of time that you wait before being seen by our physicians.       _____________________________________________________________  Should you have questions after your visit to [facility], please contact our office at [phone] between the hours of 8:00 a.m. and 4:30 p.m.  Voicemails left after 4:00 p.m. will not be returned until the following business day.  For prescription refill requests, have your pharmacy contact our office and allow 72 hours.    Cancer Center Support Programs:   > Cancer Support Group  2nd Tuesday of the month 1pm-2pm, Journey Room

## 2019-06-24 NOTE — Progress Notes (Signed)
To treatment room for chemotherapy.  Patient stated he is having prn diarrhea.  No changes in neuropathy in feet.  No s/s of distress noted.   Patient tolerated chemotherapy with no complaints voiced.  Side effects with management reviewed with understanding verbalized.  Port site clean and dry with no bruising or swelling noted at site.  Good blood return noted before and after administration of chemotherapy.  Chemotherapy pump connected with no alarms noted.  Dressing intact.  Patient left by wheelchair with VSS and no s/s of distress noted.

## 2019-06-24 NOTE — Patient Instructions (Signed)
[facility] at [facility] Discharge Instructions  Labs drawn from portacath today   Thank you for choosing  [facility] at [facility] to provide your oncology and hematology care.  To afford each patient quality time with our provider, please arrive at least 15 minutes before your scheduled appointment time.   If you have a lab appointment with the Cancer Center please come in thru the Main Entrance and check in at the main information desk.  You need to re-schedule your appointment should you arrive 10 or more minutes late.  We strive to give you quality time with our providers, and arriving late affects you and other patients whose appointments are after yours.  Also, if you no show three or more times for appointments you may be dismissed from the clinic at the providers discretion.     Again, thank you for choosing [facility].  Our hope is that these requests will decrease the amount of time that you wait before being seen by our physicians.       _____________________________________________________________  Should you have questions after your visit to [facility], please contact our office at [phone] between the hours of 8:00 a.m. and 4:30 p.m.  Voicemails left after 4:00 p.m. will not be returned until the following business day.  For prescription refill requests, have your pharmacy contact our office and allow 72 hours.    Due to Covid, you will need to wear a mask upon entering the hospital. If you do not have a mask, a mask will be given to you at the Main Entrance upon arrival. For doctor visits, patients may have 1 support person with them. For treatment visits, patients can not have anyone with them due to social distancing guidelines and our immunocompromised population.     

## 2019-06-24 NOTE — Progress Notes (Signed)
Patient has been assessed, vital signs and labs have been reviewed by Dr. Katragadda. ANC, Creatinine, LFTs, and Platelets are within treatment parameters per Dr. Katragadda. The patient is good to proceed with treatment at this time.  

## 2019-06-26 ENCOUNTER — Inpatient Hospital Stay (HOSPITAL_COMMUNITY): Payer: Medicaid Other

## 2019-06-26 ENCOUNTER — Other Ambulatory Visit: Payer: Self-pay

## 2019-06-26 VITALS — BP 133/77 | HR 110 | Temp 97.5°F | Resp 20

## 2019-06-26 DIAGNOSIS — C188 Malignant neoplasm of overlapping sites of colon: Secondary | ICD-10-CM

## 2019-06-26 DIAGNOSIS — C184 Malignant neoplasm of transverse colon: Secondary | ICD-10-CM

## 2019-06-26 DIAGNOSIS — Z5111 Encounter for antineoplastic chemotherapy: Secondary | ICD-10-CM | POA: Diagnosis not present

## 2019-06-26 MED ORDER — HEPARIN SOD (PORK) LOCK FLUSH 100 UNIT/ML IV SOLN
500.0000 [IU] | Freq: Once | INTRAVENOUS | Status: AC | PRN
  Administered 2019-06-26: 500 [IU]

## 2019-06-26 MED ORDER — SODIUM CHLORIDE 0.9% FLUSH
10.0000 mL | INTRAVENOUS | Status: DC | PRN
  Administered 2019-06-26: 10 mL

## 2019-06-26 NOTE — Progress Notes (Signed)
Pt here for pump d/c today. C/o of nausea/ diarrhea. No vomiting. Still able to intake food and liquids with no issues. Pt knows to call clinic for any issues. Pt d/c clinic via wheelchair.

## 2019-07-02 ENCOUNTER — Other Ambulatory Visit: Payer: Self-pay

## 2019-07-02 ENCOUNTER — Telehealth: Payer: Self-pay

## 2019-07-02 DIAGNOSIS — C184 Malignant neoplasm of transverse colon: Secondary | ICD-10-CM

## 2019-07-02 MED ORDER — AMITRIPTYLINE HCL 25 MG PO TABS: 25.0000 mg | ORAL_TABLET | Freq: Every day | ORAL | 0 refills | Status: AC

## 2019-07-02 NOTE — Telephone Encounter (Signed)
New Script sent to pt pharmacy with Daneil Dolin. Message as well pt needs to schedule a visit to get further refills.

## 2019-07-02 NOTE — Telephone Encounter (Signed)
We can refill with no further refills at this time until patient to make a follow up appointment.

## 2019-07-02 NOTE — Telephone Encounter (Signed)
Refill request received from [facility] for Amitriptyline 25mg  pt last seen 08/2018

## 2019-07-08 ENCOUNTER — Other Ambulatory Visit: Payer: Self-pay

## 2019-07-08 ENCOUNTER — Ambulatory Visit (HOSPITAL_COMMUNITY)
Admission: RE | Admit: 2019-07-08 | Discharge: 2019-07-08 | Disposition: A | Discharge status: Home / Self Care | Payer: Medicaid Other | Source: Ambulatory Visit | Attending: Hematology | Admitting: Hematology

## 2019-07-08 DIAGNOSIS — C188 Malignant neoplasm of overlapping sites of colon: Secondary | ICD-10-CM | POA: Diagnosis present

## 2019-07-08 MED ORDER — IOHEXOL 300 MG/ML  SOLN
100.0000 mL | Freq: Once | INTRAMUSCULAR | Status: AC | PRN
  Administered 2019-07-08: 100 mL via INTRAVENOUS

## 2019-07-09 ENCOUNTER — Inpatient Hospital Stay (HOSPITAL_COMMUNITY): Payer: Medicaid Other

## 2019-07-09 ENCOUNTER — Inpatient Hospital Stay (HOSPITAL_BASED_OUTPATIENT_CLINIC_OR_DEPARTMENT_OTHER): Payer: Medicaid Other | Admitting: Hematology

## 2019-07-09 ENCOUNTER — Inpatient Hospital Stay (HOSPITAL_COMMUNITY): Payer: Medicaid Other | Attending: Hematology

## 2019-07-09 VITALS — BP 124/73 | HR 112 | Temp 97.2°F | Resp 18 | Wt 207.5 lb

## 2019-07-09 VITALS — BP 132/80 | HR 100 | Temp 97.5°F | Resp 18

## 2019-07-09 DIAGNOSIS — R197 Diarrhea, unspecified: Secondary | ICD-10-CM | POA: Insufficient documentation

## 2019-07-09 DIAGNOSIS — C189 Malignant neoplasm of colon, unspecified: Secondary | ICD-10-CM | POA: Diagnosis not present

## 2019-07-09 DIAGNOSIS — C184 Malignant neoplasm of transverse colon: Secondary | ICD-10-CM | POA: Insufficient documentation

## 2019-07-09 DIAGNOSIS — Z803 Family history of malignant neoplasm of breast: Secondary | ICD-10-CM | POA: Insufficient documentation

## 2019-07-09 DIAGNOSIS — Z79899 Other long term (current) drug therapy: Secondary | ICD-10-CM | POA: Insufficient documentation

## 2019-07-09 DIAGNOSIS — R188 Other ascites: Secondary | ICD-10-CM | POA: Diagnosis not present

## 2019-07-09 DIAGNOSIS — G620 Drug-induced polyneuropathy: Secondary | ICD-10-CM | POA: Insufficient documentation

## 2019-07-09 DIAGNOSIS — Z806 Family history of leukemia: Secondary | ICD-10-CM | POA: Insufficient documentation

## 2019-07-09 DIAGNOSIS — Z7901 Long term (current) use of anticoagulants: Secondary | ICD-10-CM | POA: Insufficient documentation

## 2019-07-09 DIAGNOSIS — Z87891 Personal history of nicotine dependence: Secondary | ICD-10-CM | POA: Diagnosis not present

## 2019-07-09 DIAGNOSIS — Z5112 Encounter for antineoplastic immunotherapy: Secondary | ICD-10-CM | POA: Diagnosis present

## 2019-07-09 DIAGNOSIS — Z5111 Encounter for antineoplastic chemotherapy: Secondary | ICD-10-CM | POA: Diagnosis present

## 2019-07-09 DIAGNOSIS — I2699 Other pulmonary embolism without acute cor pulmonale: Secondary | ICD-10-CM | POA: Insufficient documentation

## 2019-07-09 DIAGNOSIS — R634 Abnormal weight loss: Secondary | ICD-10-CM | POA: Insufficient documentation

## 2019-07-09 DIAGNOSIS — Z9181 History of falling: Secondary | ICD-10-CM | POA: Insufficient documentation

## 2019-07-09 DIAGNOSIS — R530 Neoplastic (malignant) related fatigue: Secondary | ICD-10-CM | POA: Insufficient documentation

## 2019-07-09 DIAGNOSIS — C772 Secondary and unspecified malignant neoplasm of intra-abdominal lymph nodes: Secondary | ICD-10-CM | POA: Insufficient documentation

## 2019-07-09 DIAGNOSIS — T451X5A Adverse effect of antineoplastic and immunosuppressive drugs, initial encounter: Secondary | ICD-10-CM | POA: Diagnosis not present

## 2019-07-09 DIAGNOSIS — Z801 Family history of malignant neoplasm of trachea, bronchus and lung: Secondary | ICD-10-CM | POA: Insufficient documentation

## 2019-07-09 DIAGNOSIS — Z5189 Encounter for other specified aftercare: Secondary | ICD-10-CM | POA: Insufficient documentation

## 2019-07-09 DIAGNOSIS — C188 Malignant neoplasm of overlapping sites of colon: Secondary | ICD-10-CM

## 2019-07-09 DIAGNOSIS — E538 Deficiency of other specified B group vitamins: Secondary | ICD-10-CM

## 2019-07-09 LAB — COMPREHENSIVE METABOLIC PANEL
ALT: 8 U/L (ref 0–44)
AST: 16 U/L (ref 15–41)
Albumin: 3.1 g/dL — ABNORMAL LOW (ref 3.5–5.0)
Alkaline Phosphatase: 118 U/L (ref 38–126)
Anion gap: 11 (ref 5–15)
BUN: 7 mg/dL — ABNORMAL LOW (ref 8–23)
CO2: 29 mmol/L (ref 22–32)
Calcium: 9 mg/dL (ref 8.9–10.3)
Chloride: 93 mmol/L — ABNORMAL LOW (ref 98–111)
Creatinine, Ser: 0.85 mg/dL (ref 0.61–1.24)
GFR calc Af Amer: >60 mL/min (ref >60)
GFR calc non Af Amer: >60 mL/min (ref >60)
Glucose, Bld: 121 mg/dL — ABNORMAL HIGH (ref 70–99)
Potassium: 4.1 mmol/L (ref 3.5–5.1)
Sodium: 133 mmol/L — ABNORMAL LOW (ref 135–145)
Total Bilirubin: 0.4 mg/dL (ref 0.3–1.2)
Total Protein: 6.8 g/dL (ref 6.5–8.1)

## 2019-07-09 LAB — CBC WITH DIFFERENTIAL/PLATELET
Abs Immature Granulocytes: 0.01 10*3/uL (ref 0.00–0.07)
Basophils Absolute: 0 10*3/uL (ref 0.0–0.1)
Basophils Relative: 0 %
Eosinophils Absolute: 0.2 10*3/uL (ref 0.0–0.5)
Eosinophils Relative: 4 %
HCT: 41 % (ref 39.0–52.0)
Hemoglobin: 13.3 g/dL (ref 13.0–17.0)
Immature Granulocytes: 0 %
Lymphocytes Relative: 15 %
Lymphs Abs: 0.8 10*3/uL (ref 0.7–4.0)
MCH: 32.8 pg (ref 26.0–34.0)
MCHC: 32.4 g/dL (ref 30.0–36.0)
MCV: 101 fL — ABNORMAL HIGH (ref 80.0–100.0)
Monocytes Absolute: 0.6 10*3/uL (ref 0.1–1.0)
Monocytes Relative: 12 %
Neutro Abs: 3.6 10*3/uL (ref 1.7–7.7)
Neutrophils Relative %: 69 %
Platelets: 202 10*3/uL (ref 150–400)
RBC: 4.06 MIL/uL — ABNORMAL LOW (ref 4.22–5.81)
RDW: 15.7 % — ABNORMAL HIGH (ref 11.5–15.5)
WBC: 5.2 10*3/uL (ref 4.0–10.5)
nRBC: 0 % (ref 0.0–0.2)

## 2019-07-09 LAB — MAGNESIUM: Magnesium: 1.9 mg/dL (ref 1.7–2.4)

## 2019-07-09 MED ORDER — ATROPINE SULFATE 1 MG/ML IJ SOLN
0.5000 mg | Freq: Once | INTRAMUSCULAR | Status: AC
  Administered 2019-07-09: 0.5 mg via INTRAVENOUS
  Filled 2019-07-09: qty 1

## 2019-07-09 MED ORDER — SODIUM CHLORIDE 0.9 % IV SOLN: Freq: Once | INTRAVENOUS | Status: AC

## 2019-07-09 MED ORDER — FLUOROURACIL CHEMO INJECTION 2.5 GM/50ML
400.0000 mg/m2 | Freq: Once | INTRAVENOUS | Status: AC
  Administered 2019-07-09: 950 mg via INTRAVENOUS
  Filled 2019-07-09: qty 19

## 2019-07-09 MED ORDER — PALONOSETRON HCL INJECTION 0.25 MG/5ML
0.2500 mg | Freq: Once | INTRAVENOUS | Status: AC
  Administered 2019-07-09: 0.25 mg via INTRAVENOUS
  Filled 2019-07-09: qty 5

## 2019-07-09 MED ORDER — SODIUM CHLORIDE 0.9 % IV SOLN
10.0000 mg | Freq: Once | INTRAVENOUS | Status: AC
  Administered 2019-07-09: 10 mg via INTRAVENOUS
  Filled 2019-07-09: qty 10

## 2019-07-09 MED ORDER — SODIUM CHLORIDE 0.9% FLUSH
10.0000 mL | INTRAVENOUS | Status: DC | PRN
  Administered 2019-07-09: 10 mL

## 2019-07-09 MED ORDER — SODIUM CHLORIDE 0.9 % IV SOLN
2400.0000 mg/m2 | INTRAVENOUS | Status: DC
  Administered 2019-07-09: 5650 mg via INTRAVENOUS
  Filled 2019-07-09: qty 113

## 2019-07-09 MED ORDER — CYANOCOBALAMIN 1000 MCG/ML IJ SOLN
1000.0000 ug | Freq: Once | INTRAMUSCULAR | Status: AC
  Administered 2019-07-09: 1000 ug via INTRAMUSCULAR
  Filled 2019-07-09: qty 1

## 2019-07-09 MED ORDER — SODIUM CHLORIDE 0.9 % IV SOLN
147.0000 mg/m2 | Freq: Once | INTRAVENOUS | Status: AC
  Administered 2019-07-09: 340 mg via INTRAVENOUS
  Filled 2019-07-09: qty 15

## 2019-07-09 MED ORDER — SODIUM CHLORIDE 0.9 % IV SOLN
5.0000 mg/kg | Freq: Once | INTRAVENOUS | Status: AC
  Administered 2019-07-09: 500 mg via INTRAVENOUS
  Filled 2019-07-09: qty 16

## 2019-07-09 MED ORDER — SODIUM CHLORIDE 0.9 % IV SOLN
900.0000 mg | Freq: Once | INTRAVENOUS | Status: AC
  Administered 2019-07-09: 900 mg via INTRAVENOUS
  Filled 2019-07-09: qty 45

## 2019-07-09 NOTE — Progress Notes (Signed)
[place], [place] [address]   CLINIC:  Medical Oncology/Hematology  PCP:  Alanson Puls, [facility] [place]. / [place] [address] [phone]   REASON FOR VISIT:  Follow-up for metastatic colon cancer  CURRENT THERAPY: Chemotherapy with 5-FU and bevacizumab  BRIEF ONCOLOGIC HISTORY:  Oncology History Overview Note  Stage IIIC Erik [name]) adenocarcinoma of transverse colon, diagnosed on colonoscopy by Dr. Oneida Alar on 01/12/2015 followed by definitive surgery by Dr. [name] with right hemicolectomy on 02/12/2015.  He then underwent FOLFOX x 12 cycles in the adjuvant setting (03/30/2015- 09/06/2015).   Malignant neoplasm of transverse colon ([place])  01/12/2015 Pathologic Stage   Colon, biopsy, distal transverse - TUBULOVILLOUS ADENOMA WITH HIGH GRADE DYSPLASIA.   01/12/2015 Procedure   Colonoscopy by Dr. Oneida Alar.   01/12/2015 Tumor Marker   CEA: 6.6 (H)    01/18/2015 Imaging   CT abd/pelvis- Apple-core lesion identified in the mid transverse colon without obstruction. No evidence for lymphadenopathy in the gastrohepatic ligament or omentum.  Stable 8 mm hypo attenuating lesion in the left liver, likely a cyst.   02/10/2015 Initial Diagnosis   Adenocarcinoma of transverse colon ([place])   02/12/2015 Definitive Surgery   [name], Extended right hemicolectomy    02/12/2015 Pathology Results   Mucinous adenocarcinoma with penetration of visceral peritoneum, 4/19 lymph nodes for metastatic disease, negative resection margins, with LVI and perineural invasion   03/30/2015 - 09/06/2015 Chemotherapy   FOLFOX x 12 cycles   05/25/2015 Treatment Plan Change   5 FU bolus discontinued for cycle #5   06/08/2015 Treatment Plan Change   Treatment deferred x 1 week   06/15/2015 Treatment Plan Change   5FU CI decreased by 10% and Oxaliplatin reduced by 15% for cycles #6-#11; Oxaliplatin dropped for cycle #12 d/t neuropathy.    10/20/2015 Imaging     CT CAP- Right hemicolectomy without evidence of metastatic disease. 2. Previously measured ground-glass lesion in the left upper lobe has resolved. 3. Probable food debris in the stomach, simulating gastric wall thickening. Please correlate clinically. 4. 6 mm irregular nodular density in the left upper lobe, stable. Continued attention on followup exams is warranted.   01/25/2016 Procedure   Colonoscopy by Dr. Oneida Alar- Non-thrombosed external hemorrhoids found on digital rectal exam. - One 4 mm polyp in the rectum, removed with a cold biopsy forceps. Resected and retrieved. - Congested mucosa in the neo-terminal ileum. Biopsied. - Redundant colon. - Internal hemorrhoids.   01/26/2016 Pathology Results   1. Terminal ileum, biopsy - MILD ACUTE (ACTIVE) ILEITIS. - NO DYSPLASIA OR MALIGNANCY IDENTIFIED - SEE COMMENT. 2. Rectum, polyp(s) - HYPERPLASTIC POLYP (X 1). - NO DYSPLASIA OR MALIGNANCY IDENTIFIED.   04/13/2016 Imaging   CT chest- Stable CT chest. 6 mm irregular nodule anterior left upper lobe is stable. The scattered areas of peribronchovascular micro nodularity in the lungs bilaterally are unchanged.   10/19/2016 Imaging   CT CAP: 1. Status post right hemicolectomy. No findings to suggest metastatic disease in the abdomen or pelvis. 2. Aortic atherosclerosis. 3. Additional incidental findings, as above. Aortic Atherosclerosis (ICD10-I70.0).   01/09/2018 -  Chemotherapy   The patient had palonosetron (ALOXI) injection 0.25 mg, 0.25 mg, Intravenous,  Once, 36 of 38 cycles Administration: 0.25 mg (01/09/2018), 0.25 mg (01/23/2018), 0.25 mg (02/11/2018), 0.25 mg (02/25/2018), 0.25 mg (03/12/2018), 0.25 mg (03/27/2018), 0.25 mg (04/10/2018), 0.25 mg (04/24/2018), 0.25 mg (05/08/2018), 0.25 mg (05/22/2018), 0.25 mg (06/10/2018), 0.25 mg (06/26/2018), 0.25 mg (07/10/2018),  0.25 mg (07/24/2018), 0.25 mg (08/13/2018), 0.25 mg (08/27/2018), 0.25 mg (09/10/2018), 0.25 mg (09/24/2018), 0.25 mg (10/08/2018), 0.25  mg (11/05/2018), 0.25 mg (11/19/2018), 0.25 mg (12/04/2018), 0.25 mg (12/18/2018), 0.25 mg (01/08/2019), 0.25 mg (01/22/2019), 0.25 mg (02/04/2019), 0.25 mg (02/18/2019), 0.25 mg (03/04/2019), 0.25 mg (03/18/2019), 0.25 mg (04/01/2019), 0.25 mg (04/14/2019), 0.25 mg (04/28/2019), 0.25 mg (05/13/2019), 0.25 mg (05/27/2019), 0.25 mg (06/10/2019), 0.25 mg (06/24/2019) irinotecan (CAMPTOSAR) 420 mg in dextrose 5 % 500 mL chemo infusion, 180 mg/m2 = 420 mg, Intravenous,  Once, 9 of 9 cycles Administration: 420 mg (01/09/2018), 420 mg (01/23/2018), 420 mg (02/11/2018), 420 mg (02/25/2018), 420 mg (03/12/2018), 420 mg (03/27/2018), 420 mg (04/10/2018), 420 mg (04/24/2018), 420 mg (05/08/2018) leucovorin 900 mg in dextrose 5 % 250 mL infusion, 944 mg, Intravenous,  Once, 36 of 38 cycles Administration: 900 mg (01/09/2018), 900 mg (01/23/2018), 900 mg (02/11/2018), 900 mg (02/25/2018), 900 mg (03/12/2018), 900 mg (03/27/2018), 900 mg (04/10/2018), 900 mg (04/24/2018), 900 mg (05/08/2018), 900 mg (06/10/2018), 900 mg (06/26/2018), 900 mg (07/10/2018), 900 mg (07/24/2018), 900 mg (08/13/2018), 900 mg (08/27/2018), 900 mg (09/10/2018), 900 mg (09/24/2018), 900 mg (10/08/2018), 900 mg (11/05/2018), 900 mg (11/19/2018), 900 mg (12/04/2018), 900 mg (12/18/2018), 900 mg (01/08/2019), 900 mg (01/22/2019), 900 mg (02/04/2019), 900 mg (02/18/2019), 900 mg (03/04/2019), 900 mg (03/18/2019), 900 mg (04/01/2019), 900 mg (04/14/2019), 900 mg (04/28/2019), 900 mg (05/13/2019), 900 mg (05/27/2019), 900 mg (06/10/2019), 900 mg (06/24/2019) fluorouracil (ADRUCIL) chemo injection 950 mg, 400 mg/m2 = 950 mg, Intravenous,  Once, 36 of 38 cycles Administration: 950 mg (01/09/2018), 950 mg (01/23/2018), 950 mg (02/11/2018), 950 mg (02/25/2018), 950 mg (03/12/2018), 950 mg (03/27/2018), 950 mg (04/10/2018), 950 mg (04/24/2018), 950 mg (05/08/2018), 950 mg (05/22/2018), 950 mg (06/10/2018), 950 mg (06/26/2018), 950 mg (07/10/2018), 950 mg (07/24/2018), 950 mg (08/13/2018), 950 mg (08/27/2018), 950 mg (09/10/2018), 950 mg (09/24/2018),  950 mg (10/08/2018), 950 mg (11/05/2018), 950 mg (11/19/2018), 950 mg (12/04/2018), 950 mg (12/18/2018), 950 mg (01/08/2019), 950 mg (01/22/2019), 950 mg (02/04/2019), 950 mg (02/18/2019), 950 mg (03/04/2019), 950 mg (03/18/2019), 950 mg (04/01/2019), 950 mg (04/14/2019), 950 mg (04/28/2019), 950 mg (05/13/2019), 950 mg (05/27/2019), 950 mg (06/10/2019), 950 mg (06/24/2019) fluorouracil (ADRUCIL) 5,650 mg in sodium chloride 0.9 % 137 mL chemo infusion, 2,400 mg/m2 = 5,650 mg, Intravenous, 1 Day/Dose, 36 of 38 cycles Administration: 5,650 mg (01/09/2018), 5,650 mg (01/23/2018), 5,650 mg (02/11/2018), 5,650 mg (02/25/2018), 5,650 mg (03/12/2018), 5,650 mg (03/27/2018), 5,650 mg (04/10/2018), 5,650 mg (04/24/2018), 5,650 mg (05/08/2018), 5,650 mg (05/22/2018), 5,650 mg (06/10/2018), 5,650 mg (06/26/2018), 5,650 mg (07/10/2018), 5,650 mg (07/24/2018), 5,650 mg (08/13/2018), 5,650 mg (08/27/2018), 5,650 mg (09/10/2018), 5,650 mg (09/24/2018), 5,650 mg (10/08/2018), 5,650 mg (11/05/2018), 5,650 mg (11/19/2018), 5,650 mg (12/04/2018), 5,650 mg (12/18/2018), 5,650 mg (01/08/2019), 5,650 mg (01/22/2019), 5,650 mg (02/04/2019), 5,650 mg (02/18/2019), 5,650 mg (03/04/2019), 5,650 mg (03/18/2019), 5,650 mg (04/01/2019), 5,650 mg (04/14/2019), 5,650 mg (04/28/2019), 5,650 mg (05/13/2019), 5,650 mg (05/27/2019), 5,650 mg (06/10/2019), 5,650 mg (06/24/2019) bevacizumab-awwb (MVASI) 500 mg in sodium chloride 0.9 % 100 mL chemo infusion, 4.7 mg/kg = 525 mg (100 % of original dose 5 mg/kg), Intravenous,  Once, 1 of 1 cycle Dose modification: 5 mg/kg (original dose 5 mg/kg, Cycle 24) Administration: 500 mg (01/08/2019) bevacizumab-bvzr (ZIRABEV) 500 mg in sodium chloride 0.9 % 100 mL chemo infusion, 5 mg/kg = 500 mg (100 % of original dose 5 mg/kg), Intravenous,  Once, 15 of 17 cycles Dose modification: 5 mg/kg (original dose 5 mg/kg, Cycle 21) Administration: 500 mg (  11/19/2018), 500 mg (12/04/2018), 500 mg (12/18/2018), 500 mg (01/22/2019), 500 mg (02/04/2019), 500 mg (02/18/2019), 500  mg (03/04/2019), 500 mg (03/18/2019), 500 mg (04/01/2019), 500 mg (04/14/2019), 500 mg (04/28/2019), 500 mg (05/13/2019), 500 mg (05/27/2019), 500 mg (06/10/2019), 500 mg (06/24/2019)  for chemotherapy treatment.    01/23/2018 Cancer Staging   Staging form: Colon and Rectum, AJCC 7th Edition - Pathologic: M1 - Signed by Zoila Shutter, MD on 01/23/2018   Malignant neoplasm of overlapping sites of colon ([place])  12/26/2017 Initial Diagnosis   Cancer of overlapping sites of colon metastatic to intra-abdominal lymph node ([place])   01/09/2018 -  Chemotherapy   The patient had palonosetron (ALOXI) injection 0.25 mg, 0.25 mg, Intravenous,  Once, 13 of 14 cycles Administration: 0.25 mg (01/09/2018), 0.25 mg (01/23/2018), 0.25 mg (02/11/2018), 0.25 mg (02/25/2018), 0.25 mg (03/12/2018), 0.25 mg (03/27/2018), 0.25 mg (04/10/2018), 0.25 mg (04/24/2018), 0.25 mg (05/08/2018), 0.25 mg (05/22/2018), 0.25 mg (06/10/2018), 0.25 mg (06/26/2018), 0.25 mg (07/10/2018) irinotecan (CAMPTOSAR) 420 mg in dextrose 5 % 500 mL chemo infusion, 180 mg/m2 = 420 mg, Intravenous,  Once, 9 of 9 cycles Administration: 420 mg (01/09/2018), 420 mg (01/23/2018), 420 mg (02/11/2018), 420 mg (02/25/2018), 420 mg (03/12/2018), 420 mg (03/27/2018), 420 mg (04/10/2018), 420 mg (04/24/2018), 420 mg (05/08/2018) leucovorin 900 mg in dextrose 5 % 250 mL infusion, 944 mg, Intravenous,  Once, 13 of 14 cycles Administration: 900 mg (01/09/2018), 900 mg (01/23/2018), 900 mg (02/11/2018), 900 mg (02/25/2018), 900 mg (03/12/2018), 900 mg (03/27/2018), 900 mg (04/10/2018), 900 mg (04/24/2018), 900 mg (05/08/2018), 900 mg (06/10/2018), 900 mg (06/26/2018), 900 mg (07/10/2018) fluorouracil (ADRUCIL) chemo injection 950 mg, 400 mg/m2 = 950 mg, Intravenous,  Once, 13 of 14 cycles Administration: 950 mg (01/09/2018), 950 mg (01/23/2018), 950 mg (02/11/2018), 950 mg (02/25/2018), 950 mg (03/12/2018), 950 mg (03/27/2018), 950 mg (04/10/2018), 950 mg (04/24/2018), 950 mg (05/08/2018), 950 mg (05/22/2018), 950 mg (06/10/2018), 950 mg  (06/26/2018), 950 mg (07/10/2018) fluorouracil (ADRUCIL) 5,650 mg in sodium chloride 0.9 % 137 mL chemo infusion, 2,400 mg/m2 = 5,650 mg, Intravenous, 1 Day/Dose, 13 of 14 cycles Administration: 5,650 mg (01/09/2018), 5,650 mg (01/23/2018), 5,650 mg (02/11/2018), 5,650 mg (02/25/2018), 5,650 mg (03/12/2018), 5,650 mg (03/27/2018), 5,650 mg (04/10/2018), 5,650 mg (04/24/2018), 5,650 mg (05/08/2018), 5,650 mg (05/22/2018), 5,650 mg (06/10/2018), 5,650 mg (06/26/2018), 5,650 mg (07/10/2018)  for chemotherapy treatment.    Colon cancer metastasized to multiple sites [facility])  01/16/2018 Initial Diagnosis   Colon cancer metastasized to multiple sites [facility])   01/23/2018 - 02/10/2018 Chemotherapy   The patient had panitumumab (VECTIBIX) 600 mg in sodium chloride 0.9 % 100 mL chemo infusion, 640 mg, Intravenous,  Once, 1 of 3 cycles Administration: 600 mg (01/23/2018)  for chemotherapy treatment.    03/12/2018 - 10/21/2018 Chemotherapy   The patient had bevacizumab (AVASTIN) 500 mg in sodium chloride 0.9 % 100 mL chemo infusion, 525 mg, Intravenous,  Once, 15 of 22 cycles Administration: 500 mg (03/12/2018), 500 mg (03/27/2018), 500 mg (04/10/2018), 500 mg (04/24/2018), 500 mg (06/10/2018), 500 mg (05/08/2018), 500 mg (05/22/2018), 500 mg (06/26/2018), 500 mg (07/10/2018), 500 mg (07/24/2018), 500 mg (08/13/2018), 500 mg (08/27/2018), 500 mg (09/10/2018), 500 mg (09/24/2018), 500 mg (10/08/2018)  for chemotherapy treatment.      CANCER STAGING: Cancer Staging Malignant neoplasm of transverse colon [facility]) Staging form: Colon and Rectum, AJCC 7th Edition - Pathologic stage from 03/04/2015: Stage IIIC (T4a, N2a, cM0) - Signed by Baird Cancer, PA-C on 03/04/2015 - Pathologic: M1 - Signed by  Higgs, Mathis Dad, MD on 01/23/2018   INTERVAL HISTORY:  Mr. MARVELOUS BOUWENS, a 64 y.o. male, returns for routine follow-up and consideration for next cycle of chemotherapy. Llewelyn was last seen on 06/24/2019.  Due for cycle #37 of chemotherapy with 5-FU and  bevacizumab today.   Overall, he tells me he has been feeling pretty well. He reports having mild abdominal pain. He denies any abnormal bleeding. He reports having diarrhea, alleviated with Imodium, numbness, and reports issues with his balance.  Denies any falls from the last visit.  Has some numbness in the feet which worsens his balance.  Overall, he feels ready for next cycle of chemo today.   REVIEW OF SYSTEMS:  Review of Systems  Constitutional: Positive for appetite change (Severely decreased) and fatigue (Severe).  Respiratory: Positive for shortness of breath.   Gastrointestinal: Positive for abdominal pain and diarrhea.  Genitourinary:        Urgency  Neurological: Positive for dizziness and numbness.       Balance issues  Hematological: Does not bruise/bleed easily.  Psychiatric/Behavioral: Positive for sleep disturbance.  All other systems reviewed and are negative.   PAST MEDICAL/SURGICAL HISTORY:  Past Medical History:  Diagnosis Date  . Abnormal stress echocardiogram   . Adenocarcinoma of transverse colon ([place]) 02/10/2015  . Alcohol abuse    Heavy Use up until 2010  . Anxiety   . Blood transfusion without reported diagnosis   . Cancer of overlapping sites of colon metastatic to intra-abdominal lymph node ([place]) 12/26/2017  . COPD (chronic obstructive pulmonary disease) ([place])   . Depression   . Emphysema of lung ([place])   . GERD (gastroesophageal reflux disease)   . Head trauma 2001   closed head injury; coma for 4 weeks  . Hypercholesterolemia   . Hypertension   . ING HERN W/GANGREN RECUR UNILAT/UNSPEC ING HERN 04/21/2009   Qualifier: Diagnosis of  By: Verl Blalock, MD, Delanna Ahmadi PTSD (post-traumatic stress disorder)   . Pulmonary nodule 06/14/2016  . SAH (subarachnoid hemorrhage) ([place]) 08/31/2012  . SDH (subdural hematoma) ([place]) 08/31/2012   Past Surgical History:  Procedure Laterality Date  . BIOPSY  01/25/2016   Procedure: BIOPSY;  Surgeon: Danie Binder,  MD;  [facility];  Service: Endoscopy;;  ileum;   . cardiac cath    . COLONOSCOPY  2011   Dr. Oneida Alar: multiple adenomas and hyperplastic polyps  . COLONOSCOPY WITH PROPOFOL N/A 01/12/2015   Procedure: COLONOSCOPY WITH PROPOFOL;  Surgeon: Danie Binder, MD;  [facility];  Service: Endoscopy;  Laterality: N/A;  1030  . COLONOSCOPY WITH PROPOFOL N/A 01/25/2016   Procedure: COLONOSCOPY WITH PROPOFOL;  Surgeon: Danie Binder, MD;  [facility];  Service: Endoscopy;  Laterality: N/A;  1230  . CRANIOTOMY  2001  . ESOPHAGOGASTRODUODENOSCOPY (EGD) WITH PROPOFOL N/A 01/12/2015   Procedure: ESOPHAGOGASTRODUODENOSCOPY (EGD) WITH PROPOFOL;  Surgeon: Danie Binder, MD;  [facility];  Service: Endoscopy;  Laterality: N/A;  . head injury surgery    . HERNIA REPAIR Right 2012   Inguinal- Forestine Na  . KIDNEY SURGERY     >30 years ago  . LAPAROSCOPIC RIGHT HEMI COLECTOMY Left 02/10/2015   Procedure: LAPAROSCOPIC THEN OPEN RIGHT HEMI COLECTOMY;  Surgeon: [name], MD;  Location: [facility];  Service: General;  Laterality: Left;  . LAPAROTOMY N/A 12/19/2017   Procedure: EXPLORATORY LAPAROTOMY;  Surgeon: Aviva Signs, MD;  Location: [facility];  Service: General;  Laterality: N/A;  . OPEN REDUCTION INTERNAL FIXATION (ORIF) DISTAL RADIAL FRACTURE Right 10/18/2018   Procedure: OPEN REDUCTION INTERNAL FIXATION (ORIF) RIGHT DISTAL RADIAL FRACTURE;  Surgeon: [name], MD;  Location: [place];  Service: Orthopedics;  Laterality: Right;  block in preop  . OPEN REDUCTION INTERNAL FIXATION (ORIF) DISTAL RADIAL FRACTURE Right 12/20/2018   Procedure: OPEN REDUCTION INTERNAL FIXATION (ORIF) DISTAL RADIUS AND ULNA FRACTURE;  Surgeon: [name], MD;  Location: [place];  Service: Orthopedics;  Laterality: Right;  block  . POLYPECTOMY  01/25/2016   Procedure: POLYPECTOMY;  Surgeon: Danie Binder, MD;  [facility];  Service:  Endoscopy;;  colon  . PORT-A-CATH REMOVAL N/A 02/26/2017   Procedure: MINOR REMOVAL PORT-A-CATH;  Surgeon: Aviva Signs, MD;  Location: [facility];  Service: General;  Laterality: N/A;  Pt to arrive at [place] N/A 03/24/2015   Procedure: INSERTION PORT-A-CATH;  Surgeon: Jules Husbands, MD;  Location: [facility];  Service: General;  Laterality: N/A;  . PORTACATH PLACEMENT Left 01/07/2018   Procedure: INSERTION PORT A CATH (ATTACHED CATHETER IN LEFT SUBCLAVIAN);  Surgeon: Aviva Signs, MD;  Location: [facility];  Service: General;  Laterality: Left;    SOCIAL HISTORY:  Social History   Socioeconomic History  . Marital status: Divorced    Spouse name: Not on file  . Number of children: 2  . Years of education: 103  . Highest education level: Not on file  Occupational History  . Occupation: disabled/unemployed  . Occupation: unemployed/Mediicaid only    Comment: NO INCOME  Tobacco Use  . Smoking status: Former Smoker    Packs/day: 0.10    Years: 30.00    Pack years: 3.00    Types: Cigarettes    Start date: 02/06/1973    Quit date: 12/09/2017    Years since quitting: 1.5  . Smokeless tobacco: Never Used  . Tobacco comment: Quit on 12/09/2017  Substance and Sexual Activity  . Alcohol use: Yes    Alcohol/week: 0.0 standard drinks    Comment: beer occ, history of ETOH abuse in remote past.   . Drug use: No  . Sexual activity: Never    Partners: Female    Birth control/protection: None  Other Topics Concern  . Not on file  Social History Narrative   Single   Lives alone   Turned down for disability   Social Determinants of Health   Financial Resource Strain:   . Difficulty of Paying Living Expenses:   Food Insecurity:   . Worried About Charity fundraiser in the Last Year:   . Arboriculturist in the Last Year:   Transportation Needs:   . Film/video editor (Medical):   Marland Kitchen Lack of Transportation (Non-Medical):   Physical Activity:   . Days of Exercise per  Week:   . Minutes of Exercise per Session:   Stress:   . Feeling of Stress :   Social Connections:   . Frequency of Communication with Friends and Family:   . Frequency of Social Gatherings with Friends and Family:   . Attends Religious Services:   . Active Member of Clubs or Organizations:   . Attends Archivist Meetings:   Marland Kitchen Marital Status:   Intimate Partner Violence:   . Fear of Current or Ex-Partner:   . Emotionally Abused:   Marland Kitchen Physically Abused:   . Sexually Abused:     FAMILY HISTORY:  Family History  Problem Relation Age of Onset  . Pulmonary embolism Mother   . Breast cancer Mother   . Cancer Mother        breast cancer  . Arthritis Mother   . Heart disease Father   . Cancer Father 78       Leukemia  . Cancer Paternal Grandfather        Lung  . Ataxia Neg Hx   . Chorea Neg Hx   . Dementia Neg Hx   . Mental retardation Neg Hx   . Migraines Neg Hx   . Multiple sclerosis Neg Hx   . Neurofibromatosis Neg Hx   . Neuropathy Neg Hx   . Parkinsonism Neg Hx   . Seizures Neg Hx   . Stroke Neg Hx   . Colon cancer Neg Hx     CURRENT MEDICATIONS:  Current Outpatient Medications  Medication Sig Dispense Refill  . amitriptyline (ELAVIL) 25 MG tablet Take 1 tablet (25 mg total) by mouth at bedtime. 90 tablet 0  . atorvastatin (LIPITOR) 20 MG tablet TAKE 1 TABLET BY MOUTH AT BEDTIME (Patient taking differently: Take 20 mg by mouth daily. ) 90 tablet 3  . buPROPion (WELLBUTRIN SR) 150 MG 12 hr tablet Take 150 mg by mouth every morning.     . busPIRone (BUSPAR) 10 MG tablet Take 10 mg by mouth 2 (two) times daily.    . fluorouracil CALGB 82500 in sodium chloride 0.9 % 150 mL Inject 5,650 mg into the vein. Over 46 hours    . FLUoxetine (PROZAC) 20 MG capsule TAKE 1 CAPSULE BY MOUTH 2 TIMES A DAY (Patient taking differently: Take 20 mg by mouth 2 (two) times daily. ) 60 capsule 0  . gabapentin (NEURONTIN) 300 MG capsule TAKE 1 CAPSULE BY MOUTH THREE TIMES A DAY 120  capsule 6  . IRINOTECAN HCL IV Inject 420 mg into the vein every 14 (fourteen) days.    Marland Kitchen LEUCOVORIN CALCIUM IV Inject 944 mg into the vein every 14 (fourteen) days.    Marland Kitchen loperamide (IMODIUM) 2 MG capsule TAKE 2 CAPSULES BY MOUTH TWICE DAILY 60 capsule 2  . nystatin (MYCOSTATIN/NYSTOP) powder Apply 1 application topically 4 (four) times daily. 60 g 0  . omeprazole (PRILOSEC) 20 MG capsule Take 20 mg by mouth every morning.     Marland Kitchen PROAIR HFA 108 (90 Base) MCG/ACT inhaler INHALE 2 PUFFS BY MOUTH EVERY 6 HOURS AS NEEDED FOR SHORTNESS OF BREATH/WHEEZING. 18 each 2  . tamsulosin (FLOMAX) 0.4 MG CAPS capsule TAKE 1 CAPSULE (0.4 MG TOTAL) BY MOUTH DAILY. 30 capsule 1  . Vitamin D, Ergocalciferol, (DRISDOL) 1.25 MG (50000 UT) CAPS capsule Take 50,000 Units by mouth once a week.    Alveda Reasons 20 MG TABS tablet TAKE 1 TABLET BY MOUTH EVERY DAY WITH SUPPER 30 tablet 6  . lidocaine-prilocaine (EMLA) cream APPLY A SMALL AMOUNT OVER PORT SITE AND COVER WITH PLASTIC WRAP ONE HOUR PRIOR TO APPOINTMENT (Patient not taking: Reported on 07/09/2019) 30 g 0  . ondansetron (ZOFRAN) 8 MG tablet Take 1 tablet (8 mg total) by mouth 2 (two) times daily as needed for refractory nausea / vomiting. Start on day 3 after chemotherapy. (Patient not taking: Reported on 07/09/2019) 30 tablet 1  . prochlorperazine (COMPAZINE) 10 MG tablet Take 1 tablet (10 mg total) by mouth every 6 (six) hours as needed (NAUSEA). (Patient not taking: Reported on 07/09/2019) 30 tablet 1   No current facility-administered medications for this visit.  Facility-Administered Medications Ordered in Other Visits  Medication Dose Route Frequency Provider Last Rate Last Admin  . sodium chloride flush (NS) 0.9 % injection 10 mL  10 mL Intracatheter PRN Derek Jack, MD   10 mL at 11/07/18 1322    ALLERGIES:  No Known Allergies  PHYSICAL EXAM:  Performance status (ECOG): 1 - Symptomatic but completely ambulatory  Vitals:   07/09/19 0857  BP:  124/73  Pulse: (!) 112  Resp: 18  Temp: (!) 97.2 F (36.2 C)  SpO2: 98%   Wt Readings from Last 3 Encounters:  07/09/19 207 lb 8 oz (94.1 kg)  06/24/19 213 lb 3.2 oz (96.7 kg)  06/10/19 211 lb 9.6 oz (96 kg)   Physical Exam Vitals reviewed.  Constitutional:      Appearance: Normal appearance.  Cardiovascular:     Rate and Rhythm: Normal rate and regular rhythm.     Heart sounds: Normal heart sounds.  Pulmonary:     Effort: Pulmonary effort is normal.     Breath sounds: Normal breath sounds.  Abdominal:     Palpations: Abdomen is soft.     Tenderness: There is abdominal tenderness (Epigastric).  Skin:    General: Skin is warm.  Neurological:     General: No focal deficit present.     Mental Status: He is alert and oriented to person, place, and time.  Psychiatric:        Mood and Affect: Mood normal.        Behavior: Behavior normal.   There is a periumbilical nodule on the right side which is slightly tender.  LABORATORY DATA:  I have reviewed the labs as listed.  CBC Latest Ref Rng & Units 07/09/2019 06/24/2019 06/10/2019  WBC 4.0 - 10.5 K/uL 5.2 9.3 5.4  Hemoglobin 13.0 - 17.0 g/dL 13.3 13.9 14.1  Hematocrit 39.0 - 52.0 % 41.0 43.5 44.2  Platelets 150 - 400 K/uL 202 195 194   CMP Latest Ref Rng & Units 07/09/2019 06/24/2019 06/10/2019  Glucose 70 - 99 mg/dL 121(H) 118(H) 132(H)  BUN 8 - 23 mg/dL 7(L) 6(L) 8  Creatinine 0.61 - 1.24 mg/dL 0.85 0.74 0.94  Sodium 135 - 145 mmol/L 133(L) 137 136  Potassium 3.5 - 5.1 mmol/L 4.1 4.4 4.3  Chloride 98 - 111 mmol/L 93(L) 95(L) 95(L)  CO2 22 - 32 mmol/L '29 29 28  ' Calcium 8.9 - 10.3 mg/dL 9.0 8.9 9.0  Total Protein 6.5 - 8.1 g/dL 6.8 6.9 7.4  Total Bilirubin 0.3 - 1.2 mg/dL 0.4 0.6 0.7  Alkaline Phos 38 - 126 U/L 118 127(H) 133(H)  AST 15 - 41 U/L '16 16 17  ' ALT 0 - 44 U/L '8 10 10    ' DIAGNOSTIC IMAGING:  I have independently reviewed the scans and discussed with the patient. CT Chest W Contrast  Result Date:  07/08/2019 CLINICAL DATA:  Surveillance of metastatic colon cancer involving the transverse colon, prior surgery and ongoing chemotherapy. EXAM: CT CHEST, ABDOMEN, AND PELVIS WITH CONTRAST TECHNIQUE: Multidetector CT imaging of the chest, abdomen and pelvis was performed following the standard protocol during bolus administration of intravenous contrast. CONTRAST:  169m OMNIPAQUE IOHEXOL 300 MG/ML  SOLN COMPARISON:  Multiple exams, including 02/28/2019 FINDINGS: CT CHEST FINDINGS Cardiovascular: Left subclavian Port-A-Cath tip: SVC. Coronary, aortic arch, and branch vessel atherosclerotic vascular disease. Mediastinum/Nodes: No pathologic adenopathy is observed in the chest. Lungs/Pleura: Centrilobular emphysema. Airway thickening is present, suggesting bronchitis or reactive airways disease. Stable mild centrilobular nodularity, possibly from remote  inflammation or hypersensitivity pneumonitis. Musculoskeletal: Healing response of the right medial clavicular fracture with surrounding osteoid formation, no current bony bridging but no overt nonunion. Late phase healing response of some right anterior and anterolateral rib fractures, these are similar to the findings shown on the prior exam. Increased conspicuity of left anterior healing rib fractures indicating healing response. No discrete sternal fracture. Stable T6 thoracic compression fracture. CT ABDOMEN PELVIS FINDINGS Hepatobiliary: Small all suspected cyst anteriorly in the left hepatic lobe, 1.1 by 0.8 cm on image 53/2. Small amount of perihepatic ascites. Pancreas: Unremarkable Spleen: Unremarkable Adrenals/Urinary Tract: Unremarkable Stomach/Bowel: Wall thickening in the stomach body for example on image 11/3 is nonspecific and may be from nondistention. Right hemicolectomy. Vascular/Lymphatic: Aortoiliac atherosclerotic vascular disease. Reproductive: Dystrophic calcification in the prostate gland. Other: The laparotomy site appears somewhat nodular and  tumor deposition along the laparotomy is not excluded. In particular in the periumbilical tumor region, the local soft tissue mass measures 4.0 by 2.6 cm on image 93/2, formerly 3.7 by 1.7 cm, compatible with increase in size. Slightly increased nodularity along the perihepatic ascites. Overall appearance suggests mild progression. There is some subtle nodularity along the mesentery, probably from local lymph nodes. Trace fluid along the upper abdominal omentum for example on image 64/2, mildly increased from prior, with some associated nodularity extending across the laparotomy as shown on image 96/6 through 101/6. Musculoskeletal: New left hip joint effusion associated with mild sclerosis and contour irregularity in the left humeral head suggesting avascular necrosis with early collapse, versus early destructive lesion of the left femoral head (I favor the former). Stable mild superior endplate compression at L2. Lumbar spondylosis and degenerative disc disease causing impingement at L4-5 and L5-S1. IMPRESSION: 1. Mild increase in size of the periumbilical tumor implant, currently 4.0 by 2.6 cm, formerly 3.7 by 1.7 cm. Commensurate increase in ascites and nodularity around the liver and along the upper omentum. Appearance suggests mildly progressive peritoneal tumor spread. 2. Mild increase in nodularity along the mesentery, probably from local lymph nodes. 3. New left hip joint effusion associated with mild sclerosis and contour irregularity in the left humeral head suggesting avascular necrosis with early collapse, versus early destructive lesion of the left femoral head (I favor the former). 4. Airway thickening is present, suggesting bronchitis or reactive airways disease. 5. Stable mild centrilobular nodularity, possibly from remote inflammation or hypersensitivity pneumonitis. 6. Healing response of bilateral rib fractures. Healing right medial clavicular fracture. 7. Other imaging findings of potential  clinical significance: Coronary atherosclerosis. Wall thickening in the stomach body is nonspecific and may be from nondistention. Lumbar spondylosis and degenerative disc disease causing impingement at L4-5 and L5-S1. Emphysema and aortic atherosclerosis. Aortic Atherosclerosis (ICD10-I70.0) and Emphysema (ICD10-J43.9). Electronically Signed   By: Van Clines M.D.   On: 07/08/2019 14:28   CT Abdomen Pelvis W Contrast  Result Date: 07/08/2019 CLINICAL DATA:  Surveillance of metastatic colon cancer involving the transverse colon, prior surgery and ongoing chemotherapy. EXAM: CT CHEST, ABDOMEN, AND PELVIS WITH CONTRAST TECHNIQUE: Multidetector CT imaging of the chest, abdomen and pelvis was performed following the standard protocol during bolus administration of intravenous contrast. CONTRAST:  13m OMNIPAQUE IOHEXOL 300 MG/ML  SOLN COMPARISON:  Multiple exams, including 02/28/2019 FINDINGS: CT CHEST FINDINGS Cardiovascular: Left subclavian Port-A-Cath tip: SVC. Coronary, aortic arch, and branch vessel atherosclerotic vascular disease. Mediastinum/Nodes: No pathologic adenopathy is observed in the chest. Lungs/Pleura: Centrilobular emphysema. Airway thickening is present, suggesting bronchitis or reactive airways disease. Stable mild centrilobular nodularity, possibly  from remote inflammation or hypersensitivity pneumonitis. Musculoskeletal: Healing response of the right medial clavicular fracture with surrounding osteoid formation, no current bony bridging but no overt nonunion. Late phase healing response of some right anterior and anterolateral rib fractures, these are similar to the findings shown on the prior exam. Increased conspicuity of left anterior healing rib fractures indicating healing response. No discrete sternal fracture. Stable T6 thoracic compression fracture. CT ABDOMEN PELVIS FINDINGS Hepatobiliary: Small all suspected cyst anteriorly in the left hepatic lobe, 1.1 by 0.8 cm on image  53/2. Small amount of perihepatic ascites. Pancreas: Unremarkable Spleen: Unremarkable Adrenals/Urinary Tract: Unremarkable Stomach/Bowel: Wall thickening in the stomach body for example on image 11/3 is nonspecific and may be from nondistention. Right hemicolectomy. Vascular/Lymphatic: Aortoiliac atherosclerotic vascular disease. Reproductive: Dystrophic calcification in the prostate gland. Other: The laparotomy site appears somewhat nodular and tumor deposition along the laparotomy is not excluded. In particular in the periumbilical tumor region, the local soft tissue mass measures 4.0 by 2.6 cm on image 93/2, formerly 3.7 by 1.7 cm, compatible with increase in size. Slightly increased nodularity along the perihepatic ascites. Overall appearance suggests mild progression. There is some subtle nodularity along the mesentery, probably from local lymph nodes. Trace fluid along the upper abdominal omentum for example on image 64/2, mildly increased from prior, with some associated nodularity extending across the laparotomy as shown on image 96/6 through 101/6. Musculoskeletal: New left hip joint effusion associated with mild sclerosis and contour irregularity in the left humeral head suggesting avascular necrosis with early collapse, versus early destructive lesion of the left femoral head (I favor the former). Stable mild superior endplate compression at L2. Lumbar spondylosis and degenerative disc disease causing impingement at L4-5 and L5-S1. IMPRESSION: 1. Mild increase in size of the periumbilical tumor implant, currently 4.0 by 2.6 cm, formerly 3.7 by 1.7 cm. Commensurate increase in ascites and nodularity around the liver and along the upper omentum. Appearance suggests mildly progressive peritoneal tumor spread. 2. Mild increase in nodularity along the mesentery, probably from local lymph nodes. 3. New left hip joint effusion associated with mild sclerosis and contour irregularity in the left humeral head  suggesting avascular necrosis with early collapse, versus early destructive lesion of the left femoral head (I favor the former). 4. Airway thickening is present, suggesting bronchitis or reactive airways disease. 5. Stable mild centrilobular nodularity, possibly from remote inflammation or hypersensitivity pneumonitis. 6. Healing response of bilateral rib fractures. Healing right medial clavicular fracture. 7. Other imaging findings of potential clinical significance: Coronary atherosclerosis. Wall thickening in the stomach body is nonspecific and may be from nondistention. Lumbar spondylosis and degenerative disc disease causing impingement at L4-5 and L5-S1. Emphysema and aortic atherosclerosis. Aortic Atherosclerosis (ICD10-I70.0) and Emphysema (ICD10-J43.9). Electronically Signed   By: Van Clines M.D.   On: 07/08/2019 14:28     ASSESSMENT:  1. Metastatic colon cancer, K-ras mutation positive, MSI stable: -FOLFOX for 12 cycles in the adjuvant setting from 03/30/2015 through 09/06/2015. -9 cycles of FOLFIRI from 01/09/2018 through 05/08/2018 with addition of bevacizumab during cycle 5. -Maintenance 5-FU/leucovorin and bevacizumab started on 05/22/2018. -CEA level 1 04/28/2019 was 11.6. -We reviewed CT CAP from 07/08/2019.  Periumbilical tumor measures 4 x 2.6 cm (3.7 x 1.7 cm) with slight increased nodularity along the perihepatic ascites.  Mild increase in nodularity along the mesentery, probably local lymph nodes.  2.  Pulmonary embolism: -Incidental pulmonary embolism on CT chest on 05/20/2018.  This was confirmed on a subsequent CT angiogram. -He was  started on Xarelto.  3.  Multiple falls: -Brain MRI on 02/12/2019 did not show any major pathology. -History of multiple falls since he had a head injury many years ago. -He will follow up with Dr. Tomi Likens neurology.  PLAN:  1.  Metastatic colon cancer: -We discussed the results of the CT scan which showed slight progression.  CEA has also gone  up. -I have recommended change in chemotherapy.  He still has substantial amount of neuropathy from previous oxaliplatin.  He also has balance issues.  He has frequent falls. -Hence I have recommended adding Irinotecan back into the regimen.  I will start at lower dose of 150 mg per metered squared.  We discussed the side effects including abdominal cramping and diarrhea.  We will plan to repeat CT scans in 2 to 3 months to evaluate response. -We will proceed with his first cycle today.  I have reviewed his labs which are grossly within normal limits with normal LFTs.  Total bilirubin was normal.  2.  Pulmonary embolism: -Continue Xarelto.  No bleeding issues.  3.  Diarrhea: -He is taking Lomotil 2 tablets twice daily. -I have told him to increase it to 2 tablets 3-4 times a day as needed as the diarrhea can worsen because of Irinotecan.  4.  Difficulty urination: -Continue Flomax 0.4 mg at bedtime.  5.  Multiple falls: -No falls reported in the last 1 month.  He will follow up with neurology.  6.  Peripheral neuropathy: -Neuropathy in the feet from prior oxaliplatin is stable.  7.  Weight loss: -Weight has been stable in the last few visits.  He will continue with 2 cans of boost per day.    Orders placed this encounter:  No orders of the defined types were placed in this encounter.  Total time spent is 40 minutes with more than 50% of the time spent face-to-face discussing scan results, change in treatment plan, side effects, counseling and coordination of care.  Derek Jack, MD [facility] [phone]   I, Jacqualyn Posey, am acting as a scribe for Dr. Sanda Linger.  I, Derek Jack MD, have reviewed the above documentation for accuracy and completeness, and I agree with the above.

## 2019-07-09 NOTE — Patient Instructions (Signed)
[facility] Discharge Instructions for Patients Receiving Chemotherapy   Beginning January 23rd 2017 lab work for the [facility] will be done in the  Main lab at [facility] on 1st floor. If you have a lab appointment with the [facility] please come in thru the  Main Entrance and check in at the main information desk   Today you received the following chemotherapy agents Irinotecan, Avastin,Leucovorin and 5FU. Follow-up as scheduled  To help prevent nausea and vomiting after your treatment, we encourage you to take your nausea medication   If you develop nausea and vomiting, or diarrhea that is not controlled by your medication, call the clinic.  The clinic phone number is [phone]. Office hours are Monday-Friday 8:30am-5:00pm.  BELOW ARE SYMPTOMS THAT SHOULD BE REPORTED IMMEDIATELY:  *FEVER GREATER THAN 101.0 F  *CHILLS WITH OR WITHOUT FEVER  NAUSEA AND VOMITING THAT IS NOT CONTROLLED WITH YOUR NAUSEA MEDICATION  *UNUSUAL SHORTNESS OF BREATH  *UNUSUAL BRUISING OR BLEEDING  TENDERNESS IN MOUTH AND THROAT WITH OR WITHOUT PRESENCE OF ULCERS  *URINARY PROBLEMS  *BOWEL PROBLEMS  UNUSUAL RASH Items with * indicate a potential emergency and should be followed up as soon as possible. If you have an emergency after office hours please contact your primary care physician or go to the nearest emergency department.  Please call the clinic during office hours if you have any questions or concerns.   You may also contact the Patient Navigator at [phone] should you have any questions or need assistance in obtaining follow up care.      Resources For Cancer Patients and their Caregivers ? American Cancer Society: Can assist with transportation, wigs, general needs, runs Look Good Feel Better.        ([phone] 722-4622 ? Cancer Care: Provides financial assistance, online support groups, medication/co-pay assistance.  1-800-813-HOPE 458-342-8699) ? [facility] Assists [place] cancer patients and their families through emotional , educational and financial support.  315-559-8828 ? [place] DSS Where to apply for food stamps, Medicaid and utility assistance. 613-347-2545 ? [facility]: Transportation to medical appointments. (365)817-1865 ? Social Security Administration: May apply for disability if have a Stage IV cancer. 306-235-1098 838-124-4995 ? [employer], Disability and Transit Services: Assists with nutrition, care and transit needs. 231-306-8318

## 2019-07-09 NOTE — Progress Notes (Signed)
Patient has been assessed, vital signs and labs have been reviewed by Dr. Delton Coombes. ANC, Creatinine, LFTs, and Platelets are within treatment parameters per Dr. Delton Coombes. The patient is good to proceed with treatment at this time. Restart Irinotecan 150mg /m2 today per Dr. Delton Coombes. Also give 0.5mg  Atropine during premeds per Dr. Delton Coombes.

## 2019-07-09 NOTE — Progress Notes (Signed)
[id number] Labs reviewed with and pt seen by Dr. Delton Coombes and pt approved for Avastin,Leucovorin and 5FU with Irinotecan infusion to be added today per MD orders                                       Erik [name] tolerated chemo tx and Vit B12 injection well without complaints or incident. Pt discharged with 5FU pump infusing without issues. VSS upon discharge. Pt discharged via wheelchair in satisfactory condition

## 2019-07-09 NOTE — Patient Instructions (Signed)
[place] at [facility] Discharge Instructions  You were seen today by Dr. Delton Coombes. He went over your recent results. Dr. Delton Coombes advises you to take Imodium twice to four times daily to control your diarrhea. Dr. Delton Coombes will see you back in 2 weeks for labs and follow up.   Thank you for choosing [place] at [facility] to provide your oncology and hematology care.  To afford each patient quality time with our provider, please arrive at least 15 minutes before your scheduled appointment time.   If you have a lab appointment with the [facility] please come in thru the  Main Entrance and check in at the main information desk  You need to re-schedule your appointment should you arrive 10 or more minutes late.  We strive to give you quality time with our providers, and arriving late affects you and other patients whose appointments are after yours.  Also, if you no show three or more times for appointments you may be dismissed from the clinic at the providers discretion.     Again, thank you for choosing [facility].  Our hope is that these requests will decrease the amount of time that you wait before being seen by our physicians.       _____________________________________________________________  Should you have questions after your visit to [facility], please contact our office at [phone] between the hours of 8:00 a.m. and 4:30 p.m.  Voicemails left after 4:00 p.m. will not be returned until the following business day.  For prescription refill requests, have your pharmacy contact our office and allow 72 hours.    Cancer Center Support Programs:   > Cancer Support Group  2nd Tuesday of the month 1pm-2pm, Journey Room

## 2019-07-10 ENCOUNTER — Other Ambulatory Visit: Payer: Self-pay | Admitting: Neurology

## 2019-07-10 DIAGNOSIS — C184 Malignant neoplasm of transverse colon: Secondary | ICD-10-CM

## 2019-07-10 LAB — CEA: CEA: 13.2 ng/mL — ABNORMAL HIGH (ref 0.0–4.7)

## 2019-07-11 ENCOUNTER — Inpatient Hospital Stay (HOSPITAL_COMMUNITY): Payer: Medicaid Other

## 2019-07-11 ENCOUNTER — Other Ambulatory Visit: Payer: Self-pay

## 2019-07-11 VITALS — BP 127/79 | HR 107 | Temp 96.8°F | Resp 18

## 2019-07-11 DIAGNOSIS — Z5112 Encounter for antineoplastic immunotherapy: Secondary | ICD-10-CM | POA: Diagnosis not present

## 2019-07-11 DIAGNOSIS — C188 Malignant neoplasm of overlapping sites of colon: Secondary | ICD-10-CM

## 2019-07-11 DIAGNOSIS — C184 Malignant neoplasm of transverse colon: Secondary | ICD-10-CM

## 2019-07-11 MED ORDER — HEPARIN SOD (PORK) LOCK FLUSH 100 UNIT/ML IV SOLN
500.0000 [IU] | Freq: Once | INTRAVENOUS | Status: AC | PRN
  Administered 2019-07-11: 500 [IU]

## 2019-07-11 MED ORDER — SODIUM CHLORIDE 0.9% FLUSH
10.0000 mL | INTRAVENOUS | Status: DC | PRN
  Administered 2019-07-11: 10 mL

## 2019-07-19 ENCOUNTER — Other Ambulatory Visit (HOSPITAL_COMMUNITY): Payer: Self-pay | Admitting: Hematology

## 2019-07-23 ENCOUNTER — Inpatient Hospital Stay (HOSPITAL_BASED_OUTPATIENT_CLINIC_OR_DEPARTMENT_OTHER): Payer: Medicaid Other | Admitting: Hematology

## 2019-07-23 ENCOUNTER — Inpatient Hospital Stay (HOSPITAL_COMMUNITY): Payer: Medicaid Other

## 2019-07-23 ENCOUNTER — Other Ambulatory Visit: Payer: Self-pay

## 2019-07-23 VITALS — BP 136/81 | HR 115 | Temp 97.7°F | Resp 18 | Wt 203.0 lb

## 2019-07-23 DIAGNOSIS — C189 Malignant neoplasm of colon, unspecified: Secondary | ICD-10-CM | POA: Diagnosis not present

## 2019-07-23 DIAGNOSIS — Z5112 Encounter for antineoplastic immunotherapy: Secondary | ICD-10-CM | POA: Diagnosis not present

## 2019-07-23 DIAGNOSIS — C188 Malignant neoplasm of overlapping sites of colon: Secondary | ICD-10-CM

## 2019-07-23 DIAGNOSIS — C184 Malignant neoplasm of transverse colon: Secondary | ICD-10-CM

## 2019-07-23 LAB — COMPREHENSIVE METABOLIC PANEL
ALT: 12 U/L (ref 0–44)
AST: 19 U/L (ref 15–41)
Albumin: 3 g/dL — ABNORMAL LOW (ref 3.5–5.0)
Alkaline Phosphatase: 115 U/L (ref 38–126)
Anion gap: 14 (ref 5–15)
BUN: 8 mg/dL (ref 8–23)
CO2: 26 mmol/L (ref 22–32)
Calcium: 8.8 mg/dL — ABNORMAL LOW (ref 8.9–10.3)
Chloride: 91 mmol/L — ABNORMAL LOW (ref 98–111)
Creatinine, Ser: 0.81 mg/dL (ref 0.61–1.24)
GFR calc Af Amer: >60 mL/min (ref >60)
GFR calc non Af Amer: >60 mL/min (ref >60)
Glucose, Bld: 116 mg/dL — ABNORMAL HIGH (ref 70–99)
Potassium: 3.8 mmol/L (ref 3.5–5.1)
Sodium: 131 mmol/L — ABNORMAL LOW (ref 135–145)
Total Bilirubin: 0.3 mg/dL (ref 0.3–1.2)
Total Protein: 7.2 g/dL (ref 6.5–8.1)

## 2019-07-23 LAB — CBC WITH DIFFERENTIAL/PLATELET
Basophils Absolute: 0 10*3/uL (ref 0.0–0.1)
Basophils Relative: 1 %
Eosinophils Absolute: 0 10*3/uL (ref 0.0–0.5)
Eosinophils Relative: 2 %
HCT: 40.6 % (ref 39.0–52.0)
Hemoglobin: 12.9 g/dL — ABNORMAL LOW (ref 13.0–17.0)
Lymphocytes Relative: 24 %
Lymphs Abs: 0.5 10*3/uL — ABNORMAL LOW (ref 0.7–4.0)
MCH: 31.2 pg (ref 26.0–34.0)
MCHC: 31.8 g/dL (ref 30.0–36.0)
MCV: 98.3 fL (ref 80.0–100.0)
Monocytes Absolute: 0.3 10*3/uL (ref 0.1–1.0)
Monocytes Relative: 14 %
Neutro Abs: 1.2 10*3/uL — ABNORMAL LOW (ref 1.7–7.7)
Neutrophils Relative %: 59 %
Platelets: 255 10*3/uL (ref 150–400)
RBC: 4.13 MIL/uL — ABNORMAL LOW (ref 4.22–5.81)
RDW: 15.9 % — ABNORMAL HIGH (ref 11.5–15.5)
WBC: 2.1 10*3/uL — ABNORMAL LOW (ref 4.0–10.5)
nRBC: 0 % (ref 0.0–0.2)

## 2019-07-23 LAB — MAGNESIUM: Magnesium: 1.9 mg/dL (ref 1.7–2.4)

## 2019-07-23 MED ORDER — SODIUM CHLORIDE 0.9% FLUSH
10.0000 mL | INTRAVENOUS | Status: DC | PRN
  Administered 2019-07-23: 10 mL via INTRAVENOUS

## 2019-07-23 MED ORDER — SODIUM CHLORIDE 0.9% FLUSH
20.0000 mL | INTRAVENOUS | Status: AC | PRN
  Administered 2019-07-23: 20 mL via INTRAVENOUS

## 2019-07-23 MED ORDER — HEPARIN SOD (PORK) LOCK FLUSH 100 UNIT/ML IV SOLN
500.0000 [IU] | Freq: Once | INTRAVENOUS | Status: AC
  Administered 2019-07-23: 500 [IU] via INTRAVENOUS

## 2019-07-23 NOTE — Progress Notes (Signed)
Patient has been assessed, vital signs and labs have been reviewed by Dr. Delton Coombes.HOLD Tx today due to lab work. He will return next week with labs and possible treatment.

## 2019-07-23 NOTE — Progress Notes (Signed)
1230 Chemo tx held today per MD order. Pt discharged via wheelchair in satisfactory condition

## 2019-07-23 NOTE — Progress Notes (Signed)
[address] [place], [place] [address]   CLINIC:  Medical Oncology/Hematology  PCP:  Alanson Puls, [facility] [place]. / [place] [address] [phone]   REASON FOR VISIT:  Follow-up for metastatic colon cancer  CURRENT THERAPY: FOLFIRI and bevacizumab  BRIEF ONCOLOGIC HISTORY:  Oncology History Overview Note  Stage IIIC [name] adenocarcinoma of transverse colon, diagnosed on colonoscopy by Dr. Oneida Alar on 01/12/2015 followed by definitive surgery by Dr. [name] with right hemicolectomy on 02/12/2015.  He then underwent FOLFOX x 12 cycles in the adjuvant setting (03/30/2015- 09/06/2015).   Malignant neoplasm of transverse colon ([place])  01/12/2015 Pathologic Stage   Colon, biopsy, distal transverse - TUBULOVILLOUS ADENOMA WITH HIGH GRADE DYSPLASIA.   01/12/2015 Procedure   Colonoscopy by Dr. Oneida Alar.   01/12/2015 Tumor Marker   CEA: 6.6 (H)    01/18/2015 Imaging   CT abd/pelvis- Apple-core lesion identified in the mid transverse colon without obstruction. No evidence for lymphadenopathy in the gastrohepatic ligament or omentum.  Stable 8 mm hypo attenuating lesion in the left liver, likely a cyst.   02/10/2015 Initial Diagnosis   Adenocarcinoma of transverse colon ([place])   02/12/2015 Definitive Surgery   [name], Extended right hemicolectomy    02/12/2015 Pathology Results   Mucinous adenocarcinoma with penetration of visceral peritoneum, 4/19 lymph nodes for metastatic disease, negative resection margins, with LVI and perineural invasion   03/30/2015 - 09/06/2015 Chemotherapy   FOLFOX x 12 cycles   05/25/2015 Treatment Plan Change   5 FU bolus discontinued for cycle #5   06/08/2015 Treatment Plan Change   Treatment deferred x 1 week   06/15/2015 Treatment Plan Change   5FU CI decreased by 10% and Oxaliplatin reduced by 15% for cycles #6-#11; Oxaliplatin dropped for cycle #12 d/t neuropathy.    10/20/2015 Imaging   CT CAP- Right  hemicolectomy without evidence of metastatic disease. 2. Previously measured ground-glass lesion in the left upper lobe has resolved. 3. Probable food debris in the stomach, simulating gastric wall thickening. Please correlate clinically. 4. 6 mm irregular nodular density in the left upper lobe, stable. Continued attention on followup exams is warranted.   01/25/2016 Procedure   Colonoscopy by Dr. Oneida Alar- Non-thrombosed external hemorrhoids found on digital rectal exam. - One 4 mm polyp in the rectum, removed with a cold biopsy forceps. Resected and retrieved. - Congested mucosa in the neo-terminal ileum. Biopsied. - Redundant colon. - Internal hemorrhoids.   01/26/2016 Pathology Results   1. Terminal ileum, biopsy - MILD ACUTE (ACTIVE) ILEITIS. - NO DYSPLASIA OR MALIGNANCY IDENTIFIED - SEE COMMENT. 2. Rectum, polyp(s) - HYPERPLASTIC POLYP (X 1). - NO DYSPLASIA OR MALIGNANCY IDENTIFIED.   04/13/2016 Imaging   CT chest- Stable CT chest. 6 mm irregular nodule anterior left upper lobe is stable. The scattered areas of peribronchovascular micro nodularity in the lungs bilaterally are unchanged.   10/19/2016 Imaging   CT CAP: 1. Status post right hemicolectomy. No findings to suggest metastatic disease in the abdomen or pelvis. 2. Aortic atherosclerosis. 3. Additional incidental findings, as above. Aortic Atherosclerosis (ICD10-I70.0).   01/09/2018 -  Chemotherapy   The patient had palonosetron (ALOXI) injection 0.25 mg, 0.25 mg, Intravenous,  Once, 37 of 38 cycles Administration: 0.25 mg (01/09/2018), 0.25 mg (01/23/2018), 0.25 mg (02/11/2018), 0.25 mg (02/25/2018), 0.25 mg (03/12/2018), 0.25 mg (03/27/2018), 0.25 mg (04/10/2018), 0.25 mg (04/24/2018), 0.25 mg (05/08/2018), 0.25 mg (05/22/2018), 0.25 mg (06/10/2018), 0.25 mg (06/26/2018), 0.25 mg (07/10/2018), 0.25 mg (07/24/2018),  0.25 mg (08/13/2018), 0.25 mg (08/27/2018), 0.25 mg (09/10/2018), 0.25 mg (09/24/2018), 0.25 mg (10/08/2018), 0.25 mg (11/05/2018),  0.25 mg (11/19/2018), 0.25 mg (12/04/2018), 0.25 mg (12/18/2018), 0.25 mg (01/08/2019), 0.25 mg (01/22/2019), 0.25 mg (02/04/2019), 0.25 mg (02/18/2019), 0.25 mg (03/04/2019), 0.25 mg (03/18/2019), 0.25 mg (04/01/2019), 0.25 mg (04/14/2019), 0.25 mg (04/28/2019), 0.25 mg (05/13/2019), 0.25 mg (05/27/2019), 0.25 mg (06/10/2019), 0.25 mg (06/24/2019), 0.25 mg (07/09/2019) irinotecan (CAMPTOSAR) 420 mg in dextrose 5 % 500 mL chemo infusion, 180 mg/m2 = 420 mg, Intravenous,  Once, 10 of 11 cycles Dose modification: 150 mg/m2 (original dose 150 mg/m2, Cycle 37) Administration: 420 mg (01/09/2018), 420 mg (01/23/2018), 420 mg (02/11/2018), 420 mg (02/25/2018), 420 mg (03/12/2018), 420 mg (03/27/2018), 420 mg (04/10/2018), 420 mg (04/24/2018), 420 mg (05/08/2018), 340 mg (07/09/2019) leucovorin 900 mg in dextrose 5 % 250 mL infusion, 944 mg, Intravenous,  Once, 37 of 38 cycles Administration: 900 mg (01/09/2018), 900 mg (01/23/2018), 900 mg (02/11/2018), 900 mg (02/25/2018), 900 mg (03/12/2018), 900 mg (03/27/2018), 900 mg (04/10/2018), 900 mg (04/24/2018), 900 mg (05/08/2018), 900 mg (06/10/2018), 900 mg (06/26/2018), 900 mg (07/10/2018), 900 mg (07/24/2018), 900 mg (08/13/2018), 900 mg (08/27/2018), 900 mg (09/10/2018), 900 mg (09/24/2018), 900 mg (10/08/2018), 900 mg (11/05/2018), 900 mg (11/19/2018), 900 mg (12/04/2018), 900 mg (12/18/2018), 900 mg (01/08/2019), 900 mg (01/22/2019), 900 mg (02/04/2019), 900 mg (02/18/2019), 900 mg (03/04/2019), 900 mg (03/18/2019), 900 mg (04/01/2019), 900 mg (04/14/2019), 900 mg (04/28/2019), 900 mg (05/13/2019), 900 mg (05/27/2019), 900 mg (06/10/2019), 900 mg (06/24/2019), 900 mg (07/09/2019) fluorouracil (ADRUCIL) chemo injection 950 mg, 400 mg/m2 = 950 mg, Intravenous,  Once, 37 of 38 cycles Administration: 950 mg (01/09/2018), 950 mg (01/23/2018), 950 mg (02/11/2018), 950 mg (02/25/2018), 950 mg (03/12/2018), 950 mg (03/27/2018), 950 mg (04/10/2018), 950 mg (04/24/2018), 950 mg (05/08/2018), 950 mg (05/22/2018), 950 mg (06/10/2018), 950 mg (06/26/2018), 950 mg  (07/10/2018), 950 mg (07/24/2018), 950 mg (08/13/2018), 950 mg (08/27/2018), 950 mg (09/10/2018), 950 mg (09/24/2018), 950 mg (10/08/2018), 950 mg (11/05/2018), 950 mg (11/19/2018), 950 mg (12/04/2018), 950 mg (12/18/2018), 950 mg (01/08/2019), 950 mg (01/22/2019), 950 mg (02/04/2019), 950 mg (02/18/2019), 950 mg (03/04/2019), 950 mg (03/18/2019), 950 mg (04/01/2019), 950 mg (04/14/2019), 950 mg (04/28/2019), 950 mg (05/13/2019), 950 mg (05/27/2019), 950 mg (06/10/2019), 950 mg (06/24/2019), 950 mg (07/09/2019) fluorouracil (ADRUCIL) 5,650 mg in sodium chloride 0.9 % 137 mL chemo infusion, 2,400 mg/m2 = 5,650 mg, Intravenous, 1 Day/Dose, 37 of 38 cycles Administration: 5,650 mg (01/09/2018), 5,650 mg (01/23/2018), 5,650 mg (02/11/2018), 5,650 mg (02/25/2018), 5,650 mg (03/12/2018), 5,650 mg (03/27/2018), 5,650 mg (04/10/2018), 5,650 mg (04/24/2018), 5,650 mg (05/08/2018), 5,650 mg (05/22/2018), 5,650 mg (06/10/2018), 5,650 mg (06/26/2018), 5,650 mg (07/10/2018), 5,650 mg (07/24/2018), 5,650 mg (08/13/2018), 5,650 mg (08/27/2018), 5,650 mg (09/10/2018), 5,650 mg (09/24/2018), 5,650 mg (10/08/2018), 5,650 mg (11/05/2018), 5,650 mg (11/19/2018), 5,650 mg (12/04/2018), 5,650 mg (12/18/2018), 5,650 mg (01/08/2019), 5,650 mg (01/22/2019), 5,650 mg (02/04/2019), 5,650 mg (02/18/2019), 5,650 mg (03/04/2019), 5,650 mg (03/18/2019), 5,650 mg (04/01/2019), 5,650 mg (04/14/2019), 5,650 mg (04/28/2019), 5,650 mg (05/13/2019), 5,650 mg (05/27/2019), 5,650 mg (06/10/2019), 5,650 mg (06/24/2019), 5,650 mg (07/09/2019) bevacizumab-awwb (MVASI) 500 mg in sodium chloride 0.9 % 100 mL chemo infusion, 4.7 mg/kg = 525 mg (100 % of original dose 5 mg/kg), Intravenous,  Once, 1 of 1 cycle Dose modification: 5 mg/kg (original dose 5 mg/kg, Cycle 24) Administration: 500 mg (01/08/2019) bevacizumab-bvzr (ZIRABEV) 500 mg in sodium chloride 0.9 % 100 mL chemo infusion, 5 mg/kg = 500 mg (100 % of original dose  5 mg/kg), Intravenous,  Once, 16 of 17 cycles Dose modification: 5 mg/kg (original dose 5 mg/kg, Cycle  21) Administration: 500 mg (11/19/2018), 500 mg (12/04/2018), 500 mg (12/18/2018), 500 mg (01/22/2019), 500 mg (02/04/2019), 500 mg (02/18/2019), 500 mg (03/04/2019), 500 mg (03/18/2019), 500 mg (04/01/2019), 500 mg (04/14/2019), 500 mg (04/28/2019), 500 mg (05/13/2019), 500 mg (05/27/2019), 500 mg (06/10/2019), 500 mg (06/24/2019), 500 mg (07/09/2019)  for chemotherapy treatment.    01/23/2018 Cancer Staging   Staging form: Colon and Rectum, AJCC 7th Edition - Pathologic: M1 - Signed by Zoila Shutter, MD on 01/23/2018   Malignant neoplasm of overlapping sites of colon ([place])  12/26/2017 Initial Diagnosis   Cancer of overlapping sites of colon metastatic to intra-abdominal lymph node ([place])   01/09/2018 -  Chemotherapy   The patient had palonosetron (ALOXI) injection 0.25 mg, 0.25 mg, Intravenous,  Once, 13 of 14 cycles Administration: 0.25 mg (01/09/2018), 0.25 mg (01/23/2018), 0.25 mg (02/11/2018), 0.25 mg (02/25/2018), 0.25 mg (03/12/2018), 0.25 mg (03/27/2018), 0.25 mg (04/10/2018), 0.25 mg (04/24/2018), 0.25 mg (05/08/2018), 0.25 mg (05/22/2018), 0.25 mg (06/10/2018), 0.25 mg (06/26/2018), 0.25 mg (07/10/2018) irinotecan (CAMPTOSAR) 420 mg in dextrose 5 % 500 mL chemo infusion, 180 mg/m2 = 420 mg, Intravenous,  Once, 9 of 9 cycles Administration: 420 mg (01/09/2018), 420 mg (01/23/2018), 420 mg (02/11/2018), 420 mg (02/25/2018), 420 mg (03/12/2018), 420 mg (03/27/2018), 420 mg (04/10/2018), 420 mg (04/24/2018), 420 mg (05/08/2018) leucovorin 900 mg in dextrose 5 % 250 mL infusion, 944 mg, Intravenous,  Once, 13 of 14 cycles Administration: 900 mg (01/09/2018), 900 mg (01/23/2018), 900 mg (02/11/2018), 900 mg (02/25/2018), 900 mg (03/12/2018), 900 mg (03/27/2018), 900 mg (04/10/2018), 900 mg (04/24/2018), 900 mg (05/08/2018), 900 mg (06/10/2018), 900 mg (06/26/2018), 900 mg (07/10/2018) fluorouracil (ADRUCIL) chemo injection 950 mg, 400 mg/m2 = 950 mg, Intravenous,  Once, 13 of 14 cycles Administration: 950 mg (01/09/2018), 950 mg (01/23/2018), 950 mg  (02/11/2018), 950 mg (02/25/2018), 950 mg (03/12/2018), 950 mg (03/27/2018), 950 mg (04/10/2018), 950 mg (04/24/2018), 950 mg (05/08/2018), 950 mg (05/22/2018), 950 mg (06/10/2018), 950 mg (06/26/2018), 950 mg (07/10/2018) fluorouracil (ADRUCIL) 5,650 mg in sodium chloride 0.9 % 137 mL chemo infusion, 2,400 mg/m2 = 5,650 mg, Intravenous, 1 Day/Dose, 13 of 14 cycles Administration: 5,650 mg (01/09/2018), 5,650 mg (01/23/2018), 5,650 mg (02/11/2018), 5,650 mg (02/25/2018), 5,650 mg (03/12/2018), 5,650 mg (03/27/2018), 5,650 mg (04/10/2018), 5,650 mg (04/24/2018), 5,650 mg (05/08/2018), 5,650 mg (05/22/2018), 5,650 mg (06/10/2018), 5,650 mg (06/26/2018), 5,650 mg (07/10/2018)  for chemotherapy treatment.    Colon cancer metastasized to multiple sites [facility])  01/16/2018 Initial Diagnosis   Colon cancer metastasized to multiple sites [facility])   01/23/2018 - 02/10/2018 Chemotherapy   The patient had panitumumab (VECTIBIX) 600 mg in sodium chloride 0.9 % 100 mL chemo infusion, 640 mg, Intravenous,  Once, 1 of 3 cycles Administration: 600 mg (01/23/2018)  for chemotherapy treatment.    03/12/2018 - 10/21/2018 Chemotherapy   The patient had bevacizumab (AVASTIN) 500 mg in sodium chloride 0.9 % 100 mL chemo infusion, 525 mg, Intravenous,  Once, 15 of 22 cycles Administration: 500 mg (03/12/2018), 500 mg (03/27/2018), 500 mg (04/10/2018), 500 mg (04/24/2018), 500 mg (06/10/2018), 500 mg (05/08/2018), 500 mg (05/22/2018), 500 mg (06/26/2018), 500 mg (07/10/2018), 500 mg (07/24/2018), 500 mg (08/13/2018), 500 mg (08/27/2018), 500 mg (09/10/2018), 500 mg (09/24/2018), 500 mg (10/08/2018)  for chemotherapy treatment.      CANCER STAGING: Cancer Staging Malignant neoplasm of transverse colon [facility]) Staging form: Colon and Rectum, AJCC 7th Edition -  Pathologic stage from 03/04/2015: Stage IIIC (T4a, N2a, cM0) - Signed by Baird Cancer, PA-C on 03/04/2015 - Pathologic: M1 - Signed by Zoila Shutter, MD on 01/23/2018   INTERVAL HISTORY:  [name], a 64 y.o.  male, returns for routine follow-up and consideration for next cycle of chemotherapy. Coal was last seen on 07/09/2019.  Due for cycle #2 of FOLFIRI and bevacizumab today.   After his last treatment, he reports having more fatigue, but no more diarrhea than usual. He reports falling 1 week ago onto his right knee after feeling dizzy, but denies LOC. He still has the pain in his knee. He denies having any abnormal bleeding or nosebleeds.  Overall, he feels ready for next cycle of chemo today.    REVIEW OF SYSTEMS:  Review of Systems  Constitutional: Positive for appetite change (severely decreased) and fatigue (severe).  HENT:   Negative for nosebleeds.   Respiratory: Positive for cough and shortness of breath.   Gastrointestinal: Positive for diarrhea.  Musculoskeletal: Positive for arthralgias (9/10 R knee pain).  Neurological: Positive for dizziness and numbness (feet).  Hematological: Does not bruise/bleed easily.  All other systems reviewed and are negative.   PAST MEDICAL/SURGICAL HISTORY:  Past Medical History:  Diagnosis Date  . Abnormal stress echocardiogram   . Adenocarcinoma of transverse colon ([place]) 02/10/2015  . Alcohol abuse    Heavy Use up until 2010  . Anxiety   . Blood transfusion without reported diagnosis   . Cancer of overlapping sites of colon metastatic to intra-abdominal lymph node ([place]) 12/26/2017  . COPD (chronic obstructive pulmonary disease) ([place])   . Depression   . Emphysema of lung ([place])   . GERD (gastroesophageal reflux disease)   . Head trauma 2001   closed head injury; coma for 4 weeks  . Hypercholesterolemia   . Hypertension   . ING HERN W/GANGREN RECUR UNILAT/UNSPEC ING HERN 04/21/2009   Qualifier: Diagnosis of  By: Verl Blalock, MD, Delanna Ahmadi PTSD (post-traumatic stress disorder)   . Pulmonary nodule 06/14/2016  . SAH (subarachnoid hemorrhage) ([place]) 08/31/2012  . SDH (subdural hematoma) ([place]) 08/31/2012   Past Surgical History:  Procedure  Laterality Date  . BIOPSY  01/25/2016   Procedure: BIOPSY;  Surgeon: Danie Binder, MD;  [facility];  Service: Endoscopy;;  ileum;   . cardiac cath    . COLONOSCOPY  2011   Dr. Oneida Alar: multiple adenomas and hyperplastic polyps  . COLONOSCOPY WITH PROPOFOL N/A 01/12/2015   Procedure: COLONOSCOPY WITH PROPOFOL;  Surgeon: Danie Binder, MD;  [facility];  Service: Endoscopy;  Laterality: N/A;  1030  . COLONOSCOPY WITH PROPOFOL N/A 01/25/2016   Procedure: COLONOSCOPY WITH PROPOFOL;  Surgeon: Danie Binder, MD;  [facility];  Service: Endoscopy;  Laterality: N/A;  1230  . CRANIOTOMY  2001  . ESOPHAGOGASTRODUODENOSCOPY (EGD) WITH PROPOFOL N/A 01/12/2015   Procedure: ESOPHAGOGASTRODUODENOSCOPY (EGD) WITH PROPOFOL;  Surgeon: Danie Binder, MD;  [facility];  Service: Endoscopy;  Laterality: N/A;  . head injury surgery    . HERNIA REPAIR Right 2012   Inguinal- [place]  . KIDNEY SURGERY     >30 years ago  . LAPAROSCOPIC RIGHT HEMI COLECTOMY Left 02/10/2015   Procedure: LAPAROSCOPIC THEN OPEN RIGHT HEMI COLECTOMY;  Surgeon: [name], MD;  Location: [facility];  Service: General;  Laterality: Left;  . LAPAROTOMY N/A 12/19/2017   Procedure: EXPLORATORY LAPAROTOMY;  Surgeon: Aviva Signs,  MD;  Location: [facility];  Service: General;  Laterality: N/A;  . OPEN REDUCTION INTERNAL FIXATION (ORIF) DISTAL RADIAL FRACTURE Right 10/18/2018   Procedure: OPEN REDUCTION INTERNAL FIXATION (ORIF) RIGHT DISTAL RADIAL FRACTURE;  Surgeon: [name], MD;  Location: [facility];  Service: Orthopedics;  Laterality: Right;  block in preop  . OPEN REDUCTION INTERNAL FIXATION (ORIF) DISTAL RADIAL FRACTURE Right 12/20/2018   Procedure: OPEN REDUCTION INTERNAL FIXATION (ORIF) DISTAL RADIUS AND ULNA FRACTURE;  Surgeon: [name], MD;  Location: [place];  Service: Orthopedics;  Laterality: Right;  block  . POLYPECTOMY  01/25/2016    Procedure: POLYPECTOMY;  Surgeon: Danie Binder, MD;  [facility];  Service: Endoscopy;;  colon  . PORT-A-CATH REMOVAL N/A 02/26/2017   Procedure: MINOR REMOVAL PORT-A-CATH;  Surgeon: Aviva Signs, MD;  Location[facility];  Service: General;  Laterality: N/A;  Pt to arrive at [place] N/A 03/24/2015   Procedure: INSERTION PORT-A-CATH;  Surgeon: Jules Husbands, MD;  Location: [facility];  Service: General;  Laterality: N/A;  . PORTACATH PLACEMENT Left 01/07/2018   Procedure: INSERTION PORT A CATH (ATTACHED CATHETER IN LEFT SUBCLAVIAN);  Surgeon: Aviva Signs, MD;  Location[facility];  Service: General;  Laterality: Left;    SOCIAL HISTORY:  Social History   Socioeconomic History  . Marital status: Divorced    Spouse name: Not on file  . Number of children: 2  . Years of education: 61  . Highest education level: Not on file  Occupational History  . Occupation: disabled/unemployed  . Occupation: unemployed/Mediicaid only    Comment: NO INCOME  Tobacco Use  . Smoking status: Former Smoker    Packs/day: 0.10    Years: 30.00    Pack years: 3.00    Types: Cigarettes    Start date: 02/06/1973    Quit date: 12/09/2017    Years since quitting: 1.6  . Smokeless tobacco: Never Used  . Tobacco comment: Quit on 12/09/2017  Vaping Use  . Vaping Use: Never used  Substance and Sexual Activity  . Alcohol use: Yes    Alcohol/week: 0.0 standard drinks    Comment: beer occ, history of ETOH abuse in remote past.   . Drug use: No  . Sexual activity: Never    Partners: Female    Birth control/protection: None  Other Topics Concern  . Not on file  Social History Narrative   Single   Lives alone   Turned down for disability   Social Determinants of Health   Financial Resource Strain:   . Difficulty of Paying Living Expenses:   Food Insecurity:   . Worried About Charity fundraiser in the Last Year:   . Arboriculturist in the Last Year:   Transportation Needs:   .  Film/video editor (Medical):   Marland Kitchen Lack of Transportation (Non-Medical):   Physical Activity:   . Days of Exercise per Week:   . Minutes of Exercise per Session:   Stress:   . Feeling of Stress :   Social Connections:   . Frequency of Communication with Friends and Family:   . Frequency of Social Gatherings with Friends and Family:   . Attends Religious Services:   . Active Member of Clubs or Organizations:   . Attends Archivist Meetings:   Marland Kitchen Marital Status:   Intimate Partner Violence:   . Fear of Current or Ex-Partner:   . Emotionally Abused:   .  Physically Abused:   . Sexually Abused:     FAMILY HISTORY:  Family History  Problem Relation Age of Onset  . Pulmonary embolism Mother   . Breast cancer Mother   . Cancer Mother        breast cancer  . Arthritis Mother   . Heart disease Father   . Cancer Father 60       Leukemia  . Cancer Paternal Grandfather        Lung  . Ataxia Neg Hx   . Chorea Neg Hx   . Dementia Neg Hx   . Mental retardation Neg Hx   . Migraines Neg Hx   . Multiple sclerosis Neg Hx   . Neurofibromatosis Neg Hx   . Neuropathy Neg Hx   . Parkinsonism Neg Hx   . Seizures Neg Hx   . Stroke Neg Hx   . Colon cancer Neg Hx     CURRENT MEDICATIONS:  Current Outpatient Medications  Medication Sig Dispense Refill  . amitriptyline (ELAVIL) 25 MG tablet Take 1 tablet (25 mg total) by mouth at bedtime. 90 tablet 0  . atorvastatin (LIPITOR) 20 MG tablet TAKE 1 TABLET BY MOUTH AT BEDTIME (Patient taking differently: Take 20 mg by mouth daily. ) 90 tablet 3  . buPROPion (WELLBUTRIN SR) 150 MG 12 hr tablet Take 150 mg by mouth every morning.     . busPIRone (BUSPAR) 10 MG tablet Take 10 mg by mouth 2 (two) times daily.    . fluorouracil CALGB 60109 in sodium chloride 0.9 % 150 mL Inject 5,650 mg into the vein. Over 46 hours    . FLUoxetine (PROZAC) 20 MG capsule TAKE 1 CAPSULE BY MOUTH 2 TIMES A DAY (Patient taking differently: Take 20 mg by  mouth 2 (two) times daily. ) 60 capsule 0  . gabapentin (NEURONTIN) 300 MG capsule TAKE 1 CAPSULE BY MOUTH THREE TIMES A DAY 120 capsule 6  . IRINOTECAN HCL IV Inject 420 mg into the vein every 14 (fourteen) days.    Marland Kitchen LEUCOVORIN CALCIUM IV Inject 944 mg into the vein every 14 (fourteen) days.    Marland Kitchen lidocaine-prilocaine (EMLA) cream APPLY A SMALL AMOUNT OVER PORT SITE AND COVER WITH PLASTIC WRAP ONE HOUR PRIOR TO APPOINTMENT 30 g 0  . nystatin (MYCOSTATIN/NYSTOP) powder Apply 1 application topically 4 (four) times daily. 60 g 0  . omeprazole (PRILOSEC) 20 MG capsule Take 20 mg by mouth every morning.     . tamsulosin (FLOMAX) 0.4 MG CAPS capsule TAKE 1 CAPSULE (0.4 MG TOTAL) BY MOUTH DAILY. 30 capsule 6  . Vitamin D, Ergocalciferol, (DRISDOL) 1.25 MG (50000 UT) CAPS capsule Take 50,000 Units by mouth once a week.    Alveda Reasons 20 MG TABS tablet TAKE 1 TABLET BY MOUTH EVERY DAY WITH SUPPER 30 tablet 6  . loperamide (IMODIUM) 2 MG capsule TAKE 2 CAPSULES BY MOUTH TWICE DAILY (Patient not taking: Reported on 07/23/2019) 60 capsule 2  . ondansetron (ZOFRAN) 8 MG tablet Take 1 tablet (8 mg total) by mouth 2 (two) times daily as needed for refractory nausea / vomiting. Start on day 3 after chemotherapy. (Patient not taking: Reported on 07/23/2019) 30 tablet 1  . PROAIR HFA 108 (90 Base) MCG/ACT inhaler INHALE 2 PUFFS BY MOUTH EVERY 6 HOURS AS NEEDED FOR SHORTNESS OF BREATH/WHEEZING. (Patient not taking: Reported on 07/23/2019) 18 each 2  . prochlorperazine (COMPAZINE) 10 MG tablet Take 1 tablet (10 mg total) by mouth every 6 (six) hours  as needed (NAUSEA). (Patient not taking: Reported on 07/23/2019) 30 tablet 1   No current facility-administered medications for this visit.   Facility-Administered Medications Ordered in Other Visits  Medication Dose Route Frequency Provider Last Rate Last Admin  . sodium chloride flush (NS) 0.9 % injection 10 mL  10 mL Intracatheter PRN Derek Jack, MD   10 mL at  11/07/18 1322  . sodium chloride flush (NS) 0.9 % injection 10 mL  10 mL Intravenous PRN Derek Jack, MD   10 mL at 07/23/19 1002    ALLERGIES:  No Known Allergies  PHYSICAL EXAM:  Performance status (ECOG): 1 - Symptomatic but completely ambulatory  Vitals:   07/23/19 1010  BP: 136/81  Pulse: (!) 115  Resp: 18  Temp: 97.7 F (36.5 C)  SpO2: 100%   Wt Readings from Last 3 Encounters:  07/23/19 203 lb (92.1 kg)  07/09/19 207 lb 8 oz (94.1 kg)  06/24/19 213 lb 3.2 oz (96.7 kg)   Physical Exam Vitals reviewed.  Constitutional:      Appearance: Normal appearance.  Cardiovascular:     Rate and Rhythm: Normal rate and regular rhythm.     Pulses: Normal pulses.     Heart sounds: Normal heart sounds.  Pulmonary:     Effort: Pulmonary effort is normal.     Breath sounds: Normal breath sounds.  Abdominal:     Palpations: Abdomen is soft.     Tenderness: There is abdominal tenderness (epigastric).  Neurological:     General: No focal deficit present.     Mental Status: He is alert and oriented to person, place, and time.  Psychiatric:        Mood and Affect: Mood normal.        Behavior: Behavior normal.     LABORATORY DATA:  I have reviewed the labs as listed.  CBC Latest Ref Rng & Units 07/23/2019 07/09/2019 06/24/2019  WBC 4.0 - 10.5 K/uL 2.1(L) 5.2 9.3  Hemoglobin 13.0 - 17.0 g/dL 12.9(L) 13.3 13.9  Hematocrit 39 - 52 % 40.6 41.0 43.5  Platelets 150 - 400 K/uL 255 202 195   CMP Latest Ref Rng & Units 07/23/2019 07/09/2019 06/24/2019  Glucose 70 - 99 mg/dL 116(H) 121(H) 118(H)  BUN 8 - 23 mg/dL 8 7(L) 6(L)  Creatinine 0.61 - 1.24 mg/dL 0.81 0.85 0.74  Sodium 135 - 145 mmol/L 131(L) 133(L) 137  Potassium 3.5 - 5.1 mmol/L 3.8 4.1 4.4  Chloride 98 - 111 mmol/L 91(L) 93(L) 95(L)  CO2 22 - 32 mmol/L _0 Calcium 8.9 - 10.3 mg/dL 8.8(L) 9.0 8.9  Total Protein 6.5 - 8.1 g/dL 7.2 6.8 6.9  Total Bilirubin 0.3 - 1.2 mg/dL 0.3 0.4 0.6  Alkaline Phos 38 - 126 U/L  115 118 127(H)  AST 15 - 41 U/L _1 ALT 0 - 44 U/L _2 Lab Results  Component Value Date   CEA1 13.2 (H) 07/09/2019   CEA1 11.6 (H) 04/28/2019   CEA1 10.7 (H) 04/14/2019    DIAGNOSTIC IMAGING:  I have independently reviewed the scans and discussed with the patient. CT Chest W Contrast  Result Date: 07/08/2019 CLINICAL DATA:  Surveillance of metastatic colon cancer involving the transverse colon, prior surgery and ongoing chemotherapy. EXAM: CT CHEST, ABDOMEN, AND PELVIS WITH CONTRAST TECHNIQUE: Multidetector CT imaging of the chest, abdomen and pelvis was performed following the standard protocol during bolus administration of intravenous contrast. CONTRAST:  119m OMNIPAQUE IOHEXOL  300 MG/ML  SOLN COMPARISON:  Multiple exams, including 02/28/2019 FINDINGS: CT CHEST FINDINGS Cardiovascular: Left subclavian Port-A-Cath tip: SVC. Coronary, aortic arch, and branch vessel atherosclerotic vascular disease. Mediastinum/Nodes: No pathologic adenopathy is observed in the chest. Lungs/Pleura: Centrilobular emphysema. Airway thickening is present, suggesting bronchitis or reactive airways disease. Stable mild centrilobular nodularity, possibly from remote inflammation or hypersensitivity pneumonitis. Musculoskeletal: Healing response of the right medial clavicular fracture with surrounding osteoid formation, no current bony bridging but no overt nonunion. Late phase healing response of some right anterior and anterolateral rib fractures, these are similar to the findings shown on the prior exam. Increased conspicuity of left anterior healing rib fractures indicating healing response. No discrete sternal fracture. Stable T6 thoracic compression fracture. CT ABDOMEN PELVIS FINDINGS Hepatobiliary: Small all suspected cyst anteriorly in the left hepatic lobe, 1.1 by 0.8 cm on image 53/2. Small amount of perihepatic ascites. Pancreas: Unremarkable Spleen: Unremarkable Adrenals/Urinary Tract: Unremarkable  Stomach/Bowel: Wall thickening in the stomach body for example on image 11/3 is nonspecific and may be from nondistention. Right hemicolectomy. Vascular/Lymphatic: Aortoiliac atherosclerotic vascular disease. Reproductive: Dystrophic calcification in the prostate gland. Other: The laparotomy site appears somewhat nodular and tumor deposition along the laparotomy is not excluded. In particular in the periumbilical tumor region, the local soft tissue mass measures 4.0 by 2.6 cm on image 93/2, formerly 3.7 by 1.7 cm, compatible with increase in size. Slightly increased nodularity along the perihepatic ascites. Overall appearance suggests mild progression. There is some subtle nodularity along the mesentery, probably from local lymph nodes. Trace fluid along the upper abdominal omentum for example on image 64/2, mildly increased from prior, with some associated nodularity extending across the laparotomy as shown on image 96/6 through 101/6. Musculoskeletal: New left hip joint effusion associated with mild sclerosis and contour irregularity in the left humeral head suggesting avascular necrosis with early collapse, versus early destructive lesion of the left femoral head (I favor the former). Stable mild superior endplate compression at L2. Lumbar spondylosis and degenerative disc disease causing impingement at L4-5 and L5-S1. IMPRESSION: 1. Mild increase in size of the periumbilical tumor implant, currently 4.0 by 2.6 cm, formerly 3.7 by 1.7 cm. Commensurate increase in ascites and nodularity around the liver and along the upper omentum. Appearance suggests mildly progressive peritoneal tumor spread. 2. Mild increase in nodularity along the mesentery, probably from local lymph nodes. 3. New left hip joint effusion associated with mild sclerosis and contour irregularity in the left humeral head suggesting avascular necrosis with early collapse, versus early destructive lesion of the left femoral head (I favor the  former). 4. Airway thickening is present, suggesting bronchitis or reactive airways disease. 5. Stable mild centrilobular nodularity, possibly from remote inflammation or hypersensitivity pneumonitis. 6. Healing response of bilateral rib fractures. Healing right medial clavicular fracture. 7. Other imaging findings of potential clinical significance: Coronary atherosclerosis. Wall thickening in the stomach body is nonspecific and may be from nondistention. Lumbar spondylosis and degenerative disc disease causing impingement at L4-5 and L5-S1. Emphysema and aortic atherosclerosis. Aortic Atherosclerosis (ICD10-I70.0) and Emphysema (ICD10-J43.9). Electronically Signed   By: Van Clines M.D.   On: 07/08/2019 14:28   CT Abdomen Pelvis W Contrast  Result Date: 07/08/2019 CLINICAL DATA:  Surveillance of metastatic colon cancer involving the transverse colon, prior surgery and ongoing chemotherapy. EXAM: CT CHEST, ABDOMEN, AND PELVIS WITH CONTRAST TECHNIQUE: Multidetector CT imaging of the chest, abdomen and pelvis was performed following the standard protocol during bolus administration of intravenous contrast. CONTRAST:  148m  OMNIPAQUE IOHEXOL 300 MG/ML  SOLN COMPARISON:  Multiple exams, including 02/28/2019 FINDINGS: CT CHEST FINDINGS Cardiovascular: Left subclavian Port-A-Cath tip: SVC. Coronary, aortic arch, and branch vessel atherosclerotic vascular disease. Mediastinum/Nodes: No pathologic adenopathy is observed in the chest. Lungs/Pleura: Centrilobular emphysema. Airway thickening is present, suggesting bronchitis or reactive airways disease. Stable mild centrilobular nodularity, possibly from remote inflammation or hypersensitivity pneumonitis. Musculoskeletal: Healing response of the right medial clavicular fracture with surrounding osteoid formation, no current bony bridging but no overt nonunion. Late phase healing response of some right anterior and anterolateral rib fractures, these are similar to  the findings shown on the prior exam. Increased conspicuity of left anterior healing rib fractures indicating healing response. No discrete sternal fracture. Stable T6 thoracic compression fracture. CT ABDOMEN PELVIS FINDINGS Hepatobiliary: Small all suspected cyst anteriorly in the left hepatic lobe, 1.1 by 0.8 cm on image 53/2. Small amount of perihepatic ascites. Pancreas: Unremarkable Spleen: Unremarkable Adrenals/Urinary Tract: Unremarkable Stomach/Bowel: Wall thickening in the stomach body for example on image 11/3 is nonspecific and may be from nondistention. Right hemicolectomy. Vascular/Lymphatic: Aortoiliac atherosclerotic vascular disease. Reproductive: Dystrophic calcification in the prostate gland. Other: The laparotomy site appears somewhat nodular and tumor deposition along the laparotomy is not excluded. In particular in the periumbilical tumor region, the local soft tissue mass measures 4.0 by 2.6 cm on image 93/2, formerly 3.7 by 1.7 cm, compatible with increase in size. Slightly increased nodularity along the perihepatic ascites. Overall appearance suggests mild progression. There is some subtle nodularity along the mesentery, probably from local lymph nodes. Trace fluid along the upper abdominal omentum for example on image 64/2, mildly increased from prior, with some associated nodularity extending across the laparotomy as shown on image 96/6 through 101/6. Musculoskeletal: New left hip joint effusion associated with mild sclerosis and contour irregularity in the left humeral head suggesting avascular necrosis with early collapse, versus early destructive lesion of the left femoral head (I favor the former). Stable mild superior endplate compression at L2. Lumbar spondylosis and degenerative disc disease causing impingement at L4-5 and L5-S1. IMPRESSION: 1. Mild increase in size of the periumbilical tumor implant, currently 4.0 by 2.6 cm, formerly 3.7 by 1.7 cm. Commensurate increase in ascites  and nodularity around the liver and along the upper omentum. Appearance suggests mildly progressive peritoneal tumor spread. 2. Mild increase in nodularity along the mesentery, probably from local lymph nodes. 3. New left hip joint effusion associated with mild sclerosis and contour irregularity in the left humeral head suggesting avascular necrosis with early collapse, versus early destructive lesion of the left femoral head (I favor the former). 4. Airway thickening is present, suggesting bronchitis or reactive airways disease. 5. Stable mild centrilobular nodularity, possibly from remote inflammation or hypersensitivity pneumonitis. 6. Healing response of bilateral rib fractures. Healing right medial clavicular fracture. 7. Other imaging findings of potential clinical significance: Coronary atherosclerosis. Wall thickening in the stomach body is nonspecific and may be from nondistention. Lumbar spondylosis and degenerative disc disease causing impingement at L4-5 and L5-S1. Emphysema and aortic atherosclerosis. Aortic Atherosclerosis (ICD10-I70.0) and Emphysema (ICD10-J43.9). Electronically Signed   By: Van Clines M.D.   On: 07/08/2019 14:28     ASSESSMENT:  1. Metastatic colon cancer, K-ras mutation positive, MSI stable: -FOLFOX for 12 cycles in the adjuvant setting from 03/30/2015 through 09/06/2015. -9 cycles of FOLFIRI from 01/09/2018 through 05/08/2018 with addition of bevacizumab during cycle 5. -Maintenance 5-FU/leucovorin and bevacizumab started on 05/22/2018. -CEA level 1 04/28/2019 was 11.6. -We reviewed CT CAP  from 07/08/2019.  Periumbilical tumor measures 4 x 2.6 cm (3.7 x 1.7 cm) with slight increased nodularity along the perihepatic ascites.  Mild increase in nodularity along the mesentery, probably local lymph nodes. -FOLFIRI and bevacizumab started on 07/09/2019.  2.  Pulmonary embolism: -Incidental pulmonary embolism on CT chest on 05/20/2018.  This was confirmed on a subsequent CT  angiogram. -He was started on Xarelto.  3.  Multiple falls: -Brain MRI on 02/12/2019 did not show any major pathology. -History of multiple falls since he had a head injury many years ago. -He will follow up with Dr. Tomi Likens neurology.   PLAN:  1.  Metastatic colon cancer: -He tolerated the first cycle of FOLFIRI and bevacizumab reasonably well.  He did not have any more diarrhea. -However he fell about a week ago and has minor bruises over the knee with no major injuries. -We reviewed his labs today.  White count is 2.1 with ANC of 1200.  We will hold his treatment today. -We will recheck a CBC next week.  If ANC is more than 1500, proceed with next cycle. -Consider adding Neulasta with cycle 2 to avoid delays.  2.  Pulmonary embolism: -Continue Xarelto no bleeding issues.  3.  Diarrhea: -Continue Lomotil 2 tablets twice daily.  4.  Difficulty urination: -Continue Flomax 0.4 mg at bedtime.  5.  Multiple falls: -He will follow up with neurology.  Reported 1 fall last week.  6.  Peripheral neuropathy: -Neuropathy in the feet from prior oxaliplatin is stable..  7.  Weight loss: -This has been stable.  Continue 2 cans of boost per day.   Orders placed this encounter:  No orders of the defined types were placed in this encounter.    Derek Jack, MD [place] [phone]   I, Milinda Antis, am acting as a scribe for Dr. Sanda Linger.  I, Derek Jack MD, have reviewed the above documentation for accuracy and completeness, and I agree with the above.

## 2019-07-23 NOTE — Addendum Note (Signed)
Addended by: Anselmo Rod on: 07/23/2019 12:00 PM   Modules accepted: Orders

## 2019-07-23 NOTE — Patient Instructions (Signed)
[place] at [facility] Discharge Instructions  You were seen today by Dr. Delton Coombes. He went over your recent results. Please continue your routine care with your primary care provider. Dr. Delton Coombes will see you back in for labs and follow up.   Thank you for choosing [place] at [facility] to provide your oncology and hematology care.  To afford each patient quality time with our provider, please arrive at least 15 minutes before your scheduled appointment time.   If you have a lab appointment with the [facility] please come in thru the Main Entrance and check in at the main information desk  You need to re-schedule your appointment should you arrive 10 or more minutes late.  We strive to give you quality time with our providers, and arriving late affects you and other patients whose appointments are after yours.  Also, if you no show three or more times for appointments you may be dismissed from the clinic at the providers discretion.     Again, thank you for choosing [facility].  Our hope is that these requests will decrease the amount of time that you wait before being seen by our physicians.       _____________________________________________________________  Should you have questions after your visit to [facility], please contact our office at [phone] between the hours of 8:00 a.m. and 4:30 p.m.  Voicemails left after 4:00 p.m. will not be returned until the following business day.  For prescription refill requests, have your pharmacy contact our office and allow 72 hours.    Cancer Center Support Programs:   > Cancer Support Group  2nd Tuesday of the month 1pm-2pm, Journey Room

## 2019-07-23 NOTE — Progress Notes (Signed)
[place] reviewed with and pt seen by Dr. Delton Coombes and pt's chemo tx to be held today due to [facility] 1.2.per MD

## 2019-07-24 NOTE — Progress Notes (Signed)
.  Pharmacist Chemotherapy Monitoring - Follow Up Assessment    I verify that I have reviewed each item in the below checklist:  . Regimen for the patient is scheduled for the appropriate day and plan matches scheduled date. Marland Kitchen Appropriate non-routine labs are ordered dependent on drug ordered. . If applicable, additional medications reviewed and ordered per protocol based on lifetime cumulative doses and/or treatment regimen.   Plan for follow-up and/or issues identified: No . I-vent associated with next due treatment: No . MD and/or nursing notified: No    Received order from Dr Delton Coombes to add Pegfilgrastim to pump DC day.  Plan updated.    T.O. Dr Katragradda/Aster Screws Erik [name] 07/24/2019 8:05 AM

## 2019-07-25 ENCOUNTER — Encounter (HOSPITAL_COMMUNITY): Payer: Medicaid Other

## 2019-07-30 ENCOUNTER — Inpatient Hospital Stay (HOSPITAL_COMMUNITY): Payer: Medicaid Other

## 2019-07-30 ENCOUNTER — Other Ambulatory Visit: Payer: Self-pay

## 2019-07-30 VITALS — BP 146/83 | HR 100 | Temp 97.7°F | Resp 18

## 2019-07-30 DIAGNOSIS — C184 Malignant neoplasm of transverse colon: Secondary | ICD-10-CM

## 2019-07-30 DIAGNOSIS — C188 Malignant neoplasm of overlapping sites of colon: Secondary | ICD-10-CM

## 2019-07-30 DIAGNOSIS — Z5112 Encounter for antineoplastic immunotherapy: Secondary | ICD-10-CM | POA: Diagnosis not present

## 2019-07-30 LAB — CBC WITH DIFFERENTIAL/PLATELET
Abs Immature Granulocytes: 0.09 10*3/uL — ABNORMAL HIGH (ref 0.00–0.07)
Basophils Absolute: 0 10*3/uL (ref 0.0–0.1)
Basophils Relative: 1 %
Eosinophils Absolute: 0 10*3/uL (ref 0.0–0.5)
Eosinophils Relative: 0 %
HCT: 41.7 % (ref 39.0–52.0)
Hemoglobin: 13.7 g/dL (ref 13.0–17.0)
Immature Granulocytes: 1 %
Lymphocytes Relative: 16 %
Lymphs Abs: 1.2 10*3/uL (ref 0.7–4.0)
MCH: 31.5 pg (ref 26.0–34.0)
MCHC: 32.9 g/dL (ref 30.0–36.0)
MCV: 95.9 fL (ref 80.0–100.0)
Monocytes Absolute: 0.8 10*3/uL (ref 0.1–1.0)
Monocytes Relative: 10 %
Neutro Abs: 5.5 10*3/uL (ref 1.7–7.7)
Neutrophils Relative %: 72 %
Platelets: 382 10*3/uL (ref 150–400)
RBC: 4.35 MIL/uL (ref 4.22–5.81)
RDW: 15.7 % — ABNORMAL HIGH (ref 11.5–15.5)
WBC: 7.7 10*3/uL (ref 4.0–10.5)
nRBC: 0 % (ref 0.0–0.2)

## 2019-07-30 LAB — URINALYSIS, DIPSTICK ONLY
Bilirubin Urine: NEGATIVE
Glucose, UA: NEGATIVE mg/dL
Ketones, ur: NEGATIVE mg/dL
Nitrite: NEGATIVE
Protein, ur: NEGATIVE mg/dL
Specific Gravity, Urine: 1.01 (ref 1.005–1.030)
pH: 5 (ref 5.0–8.0)

## 2019-07-30 LAB — COMPREHENSIVE METABOLIC PANEL
ALT: 11 U/L (ref 0–44)
AST: 13 U/L — ABNORMAL LOW (ref 15–41)
Albumin: 2.9 g/dL — ABNORMAL LOW (ref 3.5–5.0)
Alkaline Phosphatase: 123 U/L (ref 38–126)
Anion gap: 15 (ref 5–15)
BUN: 8 mg/dL (ref 8–23)
CO2: 26 mmol/L (ref 22–32)
Calcium: 9.2 mg/dL (ref 8.9–10.3)
Chloride: 91 mmol/L — ABNORMAL LOW (ref 98–111)
Creatinine, Ser: 0.73 mg/dL (ref 0.61–1.24)
GFR calc Af Amer: >60 mL/min (ref >60)
GFR calc non Af Amer: >60 mL/min (ref >60)
Glucose, Bld: 97 mg/dL (ref 70–99)
Potassium: 3.6 mmol/L (ref 3.5–5.1)
Sodium: 132 mmol/L — ABNORMAL LOW (ref 135–145)
Total Bilirubin: 0.5 mg/dL (ref 0.3–1.2)
Total Protein: 6.9 g/dL (ref 6.5–8.1)

## 2019-07-30 LAB — MAGNESIUM: Magnesium: 2 mg/dL (ref 1.7–2.4)

## 2019-07-30 MED ORDER — FLUOROURACIL CHEMO INJECTION 2.5 GM/50ML
400.0000 mg/m2 | Freq: Once | INTRAVENOUS | Status: AC
  Administered 2019-07-30: 950 mg via INTRAVENOUS
  Filled 2019-07-30: qty 19

## 2019-07-30 MED ORDER — SODIUM CHLORIDE 0.9 % IV SOLN
900.0000 mg | Freq: Once | INTRAVENOUS | Status: AC
  Administered 2019-07-30: 900 mg via INTRAVENOUS
  Filled 2019-07-30: qty 45

## 2019-07-30 MED ORDER — SODIUM CHLORIDE 0.9 % IV SOLN
5.0000 mg/kg | Freq: Once | INTRAVENOUS | Status: AC
  Administered 2019-07-30: 500 mg via INTRAVENOUS
  Filled 2019-07-30: qty 4

## 2019-07-30 MED ORDER — SODIUM CHLORIDE 0.9 % IV SOLN
10.0000 mg | Freq: Once | INTRAVENOUS | Status: AC
  Administered 2019-07-30: 10 mg via INTRAVENOUS
  Filled 2019-07-30: qty 10

## 2019-07-30 MED ORDER — PALONOSETRON HCL INJECTION 0.25 MG/5ML
0.2500 mg | Freq: Once | INTRAVENOUS | Status: AC
  Administered 2019-07-30: 0.25 mg via INTRAVENOUS
  Filled 2019-07-30: qty 5

## 2019-07-30 MED ORDER — SODIUM CHLORIDE 0.9 % IV SOLN: Freq: Once | INTRAVENOUS | Status: AC

## 2019-07-30 MED ORDER — SODIUM CHLORIDE 0.9 % IV SOLN
2400.0000 mg/m2 | INTRAVENOUS | Status: DC
  Administered 2019-07-30: 5650 mg via INTRAVENOUS
  Filled 2019-07-30: qty 113

## 2019-07-30 MED ORDER — SODIUM CHLORIDE 0.9% FLUSH
10.0000 mL | INTRAVENOUS | Status: DC | PRN
  Administered 2019-07-30: 10 mL

## 2019-07-30 MED ORDER — SODIUM CHLORIDE 0.9 % IV SOLN
150.0000 mg/m2 | Freq: Once | INTRAVENOUS | Status: AC
  Administered 2019-07-30: 360 mg via INTRAVENOUS
  Filled 2019-07-30: qty 5

## 2019-07-30 MED ORDER — ATROPINE SULFATE 1 MG/ML IJ SOLN
0.5000 mg | Freq: Once | INTRAMUSCULAR | Status: AC
  Administered 2019-07-30: 0.5 mg via INTRAVENOUS
  Filled 2019-07-30: qty 1

## 2019-07-30 NOTE — Progress Notes (Signed)
Patient tolerated chemotherapy with no complaints voiced.  Side effects with management reviewed with understanding verbalized.  Port site clean and dry with no bruising or swelling noted at site.  Good blood return noted before and after administration of chemotherapy.  Chemotherapy pump connected with no alarms noted.  Dressing intact.   Patient left by wheelchair with VSS and no s/s of distress noted.

## 2019-08-01 ENCOUNTER — Inpatient Hospital Stay (HOSPITAL_COMMUNITY): Payer: Medicaid Other

## 2019-08-01 ENCOUNTER — Other Ambulatory Visit: Payer: Self-pay

## 2019-08-01 VITALS — BP 129/78 | HR 110 | Temp 97.9°F | Resp 18

## 2019-08-01 DIAGNOSIS — Z5112 Encounter for antineoplastic immunotherapy: Secondary | ICD-10-CM | POA: Diagnosis not present

## 2019-08-01 DIAGNOSIS — C188 Malignant neoplasm of overlapping sites of colon: Secondary | ICD-10-CM

## 2019-08-01 DIAGNOSIS — C184 Malignant neoplasm of transverse colon: Secondary | ICD-10-CM

## 2019-08-01 MED ORDER — HEPARIN SOD (PORK) LOCK FLUSH 100 UNIT/ML IV SOLN
500.0000 [IU] | Freq: Once | INTRAVENOUS | Status: AC | PRN
  Administered 2019-08-01: 500 [IU]

## 2019-08-01 MED ORDER — HYDROCODONE-ACETAMINOPHEN 5-325 MG PO TABS: 1.0000 | ORAL_TABLET | Freq: Four times a day (QID) | ORAL | 0 refills | Status: DC | PRN

## 2019-08-01 MED ORDER — SODIUM CHLORIDE 0.9% FLUSH
10.0000 mL | INTRAVENOUS | Status: DC | PRN
  Administered 2019-08-01: 10 mL

## 2019-08-01 MED ORDER — HYDROCODONE-ACETAMINOPHEN 7.5-325 MG PO TABS
1.0000 | ORAL_TABLET | Freq: Once | ORAL | Status: AC
  Administered 2019-08-01: 1 via ORAL
  Filled 2019-08-01: qty 1

## 2019-08-01 MED ORDER — PEGFILGRASTIM-CBQV 6 MG/0.6ML ~~LOC~~ SOSY
PREFILLED_SYRINGE | SUBCUTANEOUS | Status: AC
  Filled 2019-08-01: qty 0.6

## 2019-08-01 MED ORDER — PEGFILGRASTIM-CBQV 6 MG/0.6ML ~~LOC~~ SOSY
6.0000 mg | PREFILLED_SYRINGE | Freq: Once | SUBCUTANEOUS | Status: AC
  Administered 2019-08-01: 6 mg via SUBCUTANEOUS

## 2019-08-06 ENCOUNTER — Emergency Department (HOSPITAL_COMMUNITY): Payer: Medicaid Other

## 2019-08-06 ENCOUNTER — Other Ambulatory Visit: Payer: Self-pay

## 2019-08-06 ENCOUNTER — Encounter (HOSPITAL_COMMUNITY): Payer: Self-pay

## 2019-08-06 ENCOUNTER — Inpatient Hospital Stay (HOSPITAL_COMMUNITY)
Admission: EM | Admit: 2019-08-06 | Discharge: 2019-09-07 | DRG: 689 | Disposition: E | Payer: Medicaid Other | Attending: Internal Medicine | Admitting: Internal Medicine

## 2019-08-06 DIAGNOSIS — G9341 Metabolic encephalopathy: Secondary | ICD-10-CM | POA: Diagnosis present

## 2019-08-06 DIAGNOSIS — Z4659 Encounter for fitting and adjustment of other gastrointestinal appliance and device: Secondary | ICD-10-CM

## 2019-08-06 DIAGNOSIS — Z85038 Personal history of other malignant neoplasm of large intestine: Secondary | ICD-10-CM

## 2019-08-06 DIAGNOSIS — I468 Cardiac arrest due to other underlying condition: Secondary | ICD-10-CM | POA: Diagnosis not present

## 2019-08-06 DIAGNOSIS — Z9181 History of falling: Secondary | ICD-10-CM

## 2019-08-06 DIAGNOSIS — N3000 Acute cystitis without hematuria: Principal | ICD-10-CM

## 2019-08-06 DIAGNOSIS — Z9221 Personal history of antineoplastic chemotherapy: Secondary | ICD-10-CM

## 2019-08-06 DIAGNOSIS — Z789 Other specified health status: Secondary | ICD-10-CM | POA: Diagnosis not present

## 2019-08-06 DIAGNOSIS — E785 Hyperlipidemia, unspecified: Secondary | ICD-10-CM | POA: Diagnosis present

## 2019-08-06 DIAGNOSIS — C785 Secondary malignant neoplasm of large intestine and rectum: Secondary | ICD-10-CM | POA: Diagnosis present

## 2019-08-06 DIAGNOSIS — C800 Disseminated malignant neoplasm, unspecified: Secondary | ICD-10-CM | POA: Diagnosis present

## 2019-08-06 DIAGNOSIS — Z9049 Acquired absence of other specified parts of digestive tract: Secondary | ICD-10-CM

## 2019-08-06 DIAGNOSIS — F329 Major depressive disorder, single episode, unspecified: Secondary | ICD-10-CM | POA: Diagnosis present

## 2019-08-06 DIAGNOSIS — Z79899 Other long term (current) drug therapy: Secondary | ICD-10-CM

## 2019-08-06 DIAGNOSIS — F419 Anxiety disorder, unspecified: Secondary | ICD-10-CM | POA: Diagnosis present

## 2019-08-06 DIAGNOSIS — Z7901 Long term (current) use of anticoagulants: Secondary | ICD-10-CM

## 2019-08-06 DIAGNOSIS — Z1629 Resistance to other single specified antibiotic: Secondary | ICD-10-CM | POA: Diagnosis present

## 2019-08-06 DIAGNOSIS — E512 Wernicke's encephalopathy: Secondary | ICD-10-CM | POA: Diagnosis not present

## 2019-08-06 DIAGNOSIS — I469 Cardiac arrest, cause unspecified: Secondary | ICD-10-CM

## 2019-08-06 DIAGNOSIS — J690 Pneumonitis due to inhalation of food and vomit: Secondary | ICD-10-CM | POA: Diagnosis not present

## 2019-08-06 DIAGNOSIS — Z87820 Personal history of traumatic brain injury: Secondary | ICD-10-CM

## 2019-08-06 DIAGNOSIS — Z515 Encounter for palliative care: Secondary | ICD-10-CM

## 2019-08-06 DIAGNOSIS — Z7289 Other problems related to lifestyle: Secondary | ICD-10-CM

## 2019-08-06 DIAGNOSIS — J9690 Respiratory failure, unspecified, unspecified whether with hypoxia or hypercapnia: Secondary | ICD-10-CM

## 2019-08-06 DIAGNOSIS — R0902 Hypoxemia: Secondary | ICD-10-CM

## 2019-08-06 DIAGNOSIS — S20229A Contusion of unspecified back wall of thorax, initial encounter: Secondary | ICD-10-CM | POA: Diagnosis present

## 2019-08-06 DIAGNOSIS — E871 Hypo-osmolality and hyponatremia: Secondary | ICD-10-CM | POA: Diagnosis present

## 2019-08-06 DIAGNOSIS — C182 Malignant neoplasm of ascending colon: Secondary | ICD-10-CM | POA: Diagnosis not present

## 2019-08-06 DIAGNOSIS — K529 Noninfective gastroenteritis and colitis, unspecified: Secondary | ICD-10-CM | POA: Diagnosis present

## 2019-08-06 DIAGNOSIS — C189 Malignant neoplasm of colon, unspecified: Secondary | ICD-10-CM | POA: Diagnosis not present

## 2019-08-06 DIAGNOSIS — Z7189 Other specified counseling: Secondary | ICD-10-CM | POA: Diagnosis not present

## 2019-08-06 DIAGNOSIS — Z20822 Contact with and (suspected) exposure to covid-19: Secondary | ICD-10-CM | POA: Diagnosis present

## 2019-08-06 DIAGNOSIS — F4310 Post-traumatic stress disorder, unspecified: Secondary | ICD-10-CM | POA: Diagnosis present

## 2019-08-06 DIAGNOSIS — J449 Chronic obstructive pulmonary disease, unspecified: Secondary | ICD-10-CM | POA: Diagnosis present

## 2019-08-06 DIAGNOSIS — K219 Gastro-esophageal reflux disease without esophagitis: Secondary | ICD-10-CM | POA: Diagnosis present

## 2019-08-06 DIAGNOSIS — F10239 Alcohol dependence with withdrawal, unspecified: Secondary | ICD-10-CM | POA: Diagnosis not present

## 2019-08-06 DIAGNOSIS — B351 Tinea unguium: Secondary | ICD-10-CM | POA: Diagnosis present

## 2019-08-06 DIAGNOSIS — Z560 Unemployment, unspecified: Secondary | ICD-10-CM

## 2019-08-06 DIAGNOSIS — I2510 Atherosclerotic heart disease of native coronary artery without angina pectoris: Secondary | ICD-10-CM | POA: Diagnosis present

## 2019-08-06 DIAGNOSIS — J9601 Acute respiratory failure with hypoxia: Secondary | ICD-10-CM | POA: Diagnosis not present

## 2019-08-06 DIAGNOSIS — Z638 Other specified problems related to primary support group: Secondary | ICD-10-CM

## 2019-08-06 DIAGNOSIS — Z86711 Personal history of pulmonary embolism: Secondary | ICD-10-CM

## 2019-08-06 DIAGNOSIS — Z66 Do not resuscitate: Secondary | ICD-10-CM | POA: Diagnosis not present

## 2019-08-06 DIAGNOSIS — N400 Enlarged prostate without lower urinary tract symptoms: Secondary | ICD-10-CM | POA: Diagnosis present

## 2019-08-06 DIAGNOSIS — B964 Proteus (mirabilis) (morganii) as the cause of diseases classified elsewhere: Secondary | ICD-10-CM | POA: Diagnosis present

## 2019-08-06 DIAGNOSIS — E876 Hypokalemia: Secondary | ICD-10-CM | POA: Diagnosis present

## 2019-08-06 DIAGNOSIS — C772 Secondary and unspecified malignant neoplasm of intra-abdominal lymph nodes: Secondary | ICD-10-CM | POA: Diagnosis present

## 2019-08-06 DIAGNOSIS — K567 Ileus, unspecified: Secondary | ICD-10-CM

## 2019-08-06 DIAGNOSIS — I10 Essential (primary) hypertension: Secondary | ICD-10-CM | POA: Diagnosis present

## 2019-08-06 DIAGNOSIS — E860 Dehydration: Secondary | ICD-10-CM | POA: Diagnosis present

## 2019-08-06 DIAGNOSIS — Z87891 Personal history of nicotine dependence: Secondary | ICD-10-CM

## 2019-08-06 DIAGNOSIS — K838 Other specified diseases of biliary tract: Secondary | ICD-10-CM | POA: Diagnosis not present

## 2019-08-06 LAB — COMPREHENSIVE METABOLIC PANEL
ALT: 11 U/L (ref 0–44)
AST: 13 U/L — ABNORMAL LOW (ref 15–41)
Albumin: 3 g/dL — ABNORMAL LOW (ref 3.5–5.0)
Alkaline Phosphatase: 99 U/L (ref 38–126)
Anion gap: 13 (ref 5–15)
BUN: 10 mg/dL (ref 8–23)
CO2: 24 mmol/L (ref 22–32)
Calcium: 8.6 mg/dL — ABNORMAL LOW (ref 8.9–10.3)
Chloride: 91 mmol/L — ABNORMAL LOW (ref 98–111)
Creatinine, Ser: 0.78 mg/dL (ref 0.61–1.24)
GFR calc Af Amer: >60 mL/min (ref >60)
GFR calc non Af Amer: >60 mL/min (ref >60)
Glucose, Bld: 144 mg/dL — ABNORMAL HIGH (ref 70–99)
Potassium: 3.7 mmol/L (ref 3.5–5.1)
Sodium: 128 mmol/L — ABNORMAL LOW (ref 135–145)
Total Bilirubin: 0.9 mg/dL (ref 0.3–1.2)
Total Protein: 6.5 g/dL (ref 6.5–8.1)

## 2019-08-06 LAB — URINALYSIS, ROUTINE W REFLEX MICROSCOPIC
Glucose, UA: NEGATIVE mg/dL
Hgb urine dipstick: NEGATIVE
Ketones, ur: 5 mg/dL — AB
Nitrite: POSITIVE — AB
Protein, ur: 100 mg/dL — AB
Specific Gravity, Urine: 1.015 (ref 1.005–1.030)
WBC, UA: >50 WBC/hpf — ABNORMAL HIGH (ref 0–5)
pH: 9 — ABNORMAL HIGH (ref 5.0–8.0)

## 2019-08-06 LAB — CBC WITH DIFFERENTIAL/PLATELET
Abs Immature Granulocytes: 0.05 10*3/uL (ref 0.00–0.07)
Basophils Absolute: 0.1 10*3/uL (ref 0.0–0.1)
Basophils Relative: 2 %
Eosinophils Absolute: 0 10*3/uL (ref 0.0–0.5)
Eosinophils Relative: 0 %
HCT: 36.7 % — ABNORMAL LOW (ref 39.0–52.0)
Hemoglobin: 12.4 g/dL — ABNORMAL LOW (ref 13.0–17.0)
Immature Granulocytes: 1 %
Lymphocytes Relative: 15 %
Lymphs Abs: 0.5 10*3/uL — ABNORMAL LOW (ref 0.7–4.0)
MCH: 31.6 pg (ref 26.0–34.0)
MCHC: 33.8 g/dL (ref 30.0–36.0)
MCV: 93.4 fL (ref 80.0–100.0)
Monocytes Absolute: 0.4 10*3/uL (ref 0.1–1.0)
Monocytes Relative: 12 %
Neutro Abs: 2.4 10*3/uL (ref 1.7–7.7)
Neutrophils Relative %: 70 %
Platelets: 150 10*3/uL (ref 150–400)
RBC: 3.93 MIL/uL — ABNORMAL LOW (ref 4.22–5.81)
RDW: 15.8 % — ABNORMAL HIGH (ref 11.5–15.5)
WBC: 3.5 10*3/uL — ABNORMAL LOW (ref 4.0–10.5)
nRBC: 0 % (ref 0.0–0.2)

## 2019-08-06 LAB — PROTIME-INR
INR: 1.2 (ref 0.8–1.2)
Prothrombin Time: 14.5 seconds (ref 11.4–15.2)

## 2019-08-06 LAB — SARS CORONAVIRUS 2 BY RT PCR (HOSPITAL ORDER, PERFORMED IN ~~LOC~~ HOSPITAL LAB): SARS Coronavirus 2: NEGATIVE

## 2019-08-06 LAB — AMMONIA: Ammonia: 22 umol/L (ref 9–35)

## 2019-08-06 LAB — CBG MONITORING, ED: Glucose-Capillary: 141 mg/dL — ABNORMAL HIGH (ref 70–99)

## 2019-08-06 LAB — LACTIC ACID, PLASMA: Lactic Acid, Venous: 1.1 mmol/L (ref 0.5–1.9)

## 2019-08-06 MED ORDER — ONDANSETRON HCL 4 MG/2ML IJ SOLN
4.0000 mg | Freq: Four times a day (QID) | INTRAMUSCULAR | Status: DC | PRN
  Administered 2019-08-06: 4 mg via INTRAVENOUS
  Filled 2019-08-06: qty 2

## 2019-08-06 MED ORDER — ACETAMINOPHEN 325 MG PO TABS
650.0000 mg | ORAL_TABLET | Freq: Four times a day (QID) | ORAL | Status: DC | PRN
  Administered 2019-08-07: 650 mg via ORAL
  Filled 2019-08-06: qty 2

## 2019-08-06 MED ORDER — ONDANSETRON HCL 4 MG PO TABS: 4.0000 mg | ORAL_TABLET | Freq: Four times a day (QID) | ORAL | Status: DC | PRN

## 2019-08-06 MED ORDER — SODIUM CHLORIDE 0.9 % IV BOLUS
1000.0000 mL | Freq: Once | INTRAVENOUS | Status: AC
  Administered 2019-08-06: 1000 mL via INTRAVENOUS

## 2019-08-06 MED ORDER — SODIUM CHLORIDE 0.9 % IV SOLN
1.0000 g | INTRAVENOUS | Status: AC
  Administered 2019-08-07 – 2019-08-11 (×5): 1 g via INTRAVENOUS
  Filled 2019-08-06 (×6): qty 10

## 2019-08-06 MED ORDER — POLYETHYLENE GLYCOL 3350 17 G PO PACK: 17.0000 g | PACK | Freq: Every day | ORAL | Status: DC | PRN

## 2019-08-06 MED ORDER — POTASSIUM CHLORIDE IN NACL 20-0.9 MEQ/L-% IV SOLN: INTRAVENOUS | Status: DC

## 2019-08-06 MED ORDER — SODIUM CHLORIDE 0.9 % IV SOLN
1.0000 g | Freq: Once | INTRAVENOUS | Status: AC
  Administered 2019-08-06: 1 g via INTRAVENOUS
  Filled 2019-08-06: qty 10

## 2019-08-06 MED ORDER — ACETAMINOPHEN 650 MG RE SUPP
650.0000 mg | Freq: Four times a day (QID) | RECTAL | Status: DC | PRN
  Administered 2019-08-12: 650 mg via RECTAL
  Filled 2019-08-06: qty 1

## 2019-08-06 NOTE — ED Provider Notes (Signed)
[facility] Provider Note   CSN: [id number] Arrival date & time: 07/12/2019  1448     History Chief Complaint  Patient presents with  . Altered Mental Status    Erik [name] is a 64 y.o. male.  Pt presents to the ED today with AMS.  The pt's neighbor went to check on patient today and found him confused.  Pt has a hx of metastatic colon cancer.  He has a port with a band aid on it.  I asked him if he gets chemo and he said he doesn't.  He just saw Dr. Delton Coombes on 6/16 and has his last dose of neulasta on 6/25.  He received FOLFIRI and bevacizumab on 6/23.  He is unable to tell me what month or date it is.  Pt denies any recent falls, but has a large bruise to his back.  Pt is on Xarelto due to a PE.  Pt denies diarrhea, but has dried stool all over his legs.  Neighbors report to EMS that pt is unable to care for himself.  No family around.         Past Medical History:  Diagnosis Date  . Abnormal stress echocardiogram   . Adenocarcinoma of transverse colon ([place]) 02/10/2015  . Alcohol abuse    Heavy Use up until 2010  . Anxiety   . Blood transfusion without reported diagnosis   . Cancer of overlapping sites of colon metastatic to intra-abdominal lymph node ([place]) 12/26/2017  . COPD (chronic obstructive pulmonary disease) ([place])   . Depression   . Emphysema of lung ([place])   . GERD (gastroesophageal reflux disease)   . Head trauma 2001   closed head injury; coma for 4 weeks  . Hypercholesterolemia   . Hypertension   . ING HERN W/GANGREN RECUR UNILAT/UNSPEC ING HERN 04/21/2009   Qualifier: Diagnosis of  By: Verl Blalock, MD, Delanna Ahmadi PTSD (post-traumatic stress disorder)   . Pulmonary nodule 06/14/2016  . SAH (subarachnoid hemorrhage) ([place]) 08/31/2012  . SDH (subdural hematoma) ([place]) 08/31/2012    Patient Active Problem List   Diagnosis Date Noted  . Colon cancer metastasized to multiple sites ([place]) 01/16/2018  . Malignant neoplasm of overlapping sites  of colon ([employer]) 12/26/2017  . S/P exploratory laparotomy 12/19/2017  . Carcinomatosis ([place])   . Psoriasis 09/12/2016  . Pulmonary nodule 06/14/2016  . History of colon cancer   . Diarrhea 01/06/2016  . Cigarette nicotine dependence with nicotine-induced disorder 06/18/2015  . Chronic obstructive pulmonary disease ([place]) 06/18/2015  . Depression 06/18/2015  . B12 deficiency 04/14/2015  . Malignant neoplasm of transverse colon ([place]) 02/10/2015  . GERD (gastroesophageal reflux disease) 12/15/2014  . History of colonic polyps 12/15/2014  . History of alcohol abuse 09/16/2014  . Panic disorder 10/10/2012  . Major depressive disorder, single episode 10/10/2012  . Bilateral lower extremity edema 04/24/2012  . Hyperlipidemia 04/21/2009  . CAD, NATIVE VESSEL 03/25/2009  . HEAD TRAUMA 03/02/2009    Past Surgical History:  Procedure Laterality Date  . BIOPSY  01/25/2016   Procedure: BIOPSY;  Surgeon: Danie Binder, MD;  [facility];  Service: Endoscopy;;  ileum;   . cardiac cath    . COLONOSCOPY  2011   Dr. Oneida Alar: multiple adenomas and hyperplastic polyps  . COLONOSCOPY WITH PROPOFOL N/A 01/12/2015   Procedure: COLONOSCOPY WITH PROPOFOL;  Surgeon: Danie Binder, MD;  [facility];  Service: Endoscopy;  Laterality: N/A;  1030  .  COLONOSCOPY WITH PROPOFOL N/A 01/25/2016   Procedure: COLONOSCOPY WITH PROPOFOL;  Surgeon: Danie Binder, MD;  [facility];  Service: Endoscopy;  Laterality: N/A;  1230  . CRANIOTOMY  2001  . ESOPHAGOGASTRODUODENOSCOPY (EGD) WITH PROPOFOL N/A 01/12/2015   Procedure: ESOPHAGOGASTRODUODENOSCOPY (EGD) WITH PROPOFOL;  Surgeon: Danie Binder, MD;  [facility];  Service: Endoscopy;  Laterality: N/A;  . head injury surgery    . HERNIA REPAIR Right 2012   Inguinal- [place] Na  . KIDNEY SURGERY     >30 years ago  . LAPAROSCOPIC RIGHT HEMI COLECTOMY Left 02/10/2015   Procedure: LAPAROSCOPIC THEN OPEN RIGHT HEMI COLECTOMY;   Surgeon: [name], MD;  Location: [facility];  Service: General;  Laterality: Left;  . LAPAROTOMY N/A 12/19/2017   Procedure: EXPLORATORY LAPAROTOMY;  Surgeon: Aviva Signs, MD;  Location: [facility];  Service: General;  Laterality: N/A;  . OPEN REDUCTION INTERNAL FIXATION (ORIF) DISTAL RADIAL FRACTURE Right 10/18/2018   Procedure: OPEN REDUCTION INTERNAL FIXATION (ORIF) RIGHT DISTAL RADIAL FRACTURE;  Surgeon: [name], MD;  Location: [facility];  Service: Orthopedics;  Laterality: Right;  block in preop  . OPEN REDUCTION INTERNAL FIXATION (ORIF) DISTAL RADIAL FRACTURE Right 12/20/2018   Procedure: OPEN REDUCTION INTERNAL FIXATION (ORIF) DISTAL RADIUS AND ULNA FRACTURE;  Surgeon: [name], MD;  Location: [facility];  Service: Orthopedics;  Laterality: Right;  block  . POLYPECTOMY  01/25/2016   Procedure: POLYPECTOMY;  Surgeon: Danie Binder, MD;  [facility];  Service: Endoscopy;;  colon  . PORT-A-CATH REMOVAL N/A 02/26/2017   Procedure: MINOR REMOVAL PORT-A-CATH;  Surgeon: Aviva Signs, MD;  [facility];  Service: General;  Laterality: N/A;  Pt to arrive at [facility] N/A 03/24/2015   Procedure: INSERTION PORT-A-CATH;  Surgeon: Jules Husbands, MD;  Location: [facility];  Service: General;  Laterality: N/A;  . PORTACATH PLACEMENT Left 01/07/2018   Procedure: INSERTION PORT A CATH (ATTACHED CATHETER IN LEFT SUBCLAVIAN);  Surgeon: Aviva Signs, MD;  Location[facility];  Service: General;  Laterality: Left;       Family History  Problem Relation Age of Onset  . Pulmonary embolism Mother   . Breast cancer Mother   . Cancer Mother        breast cancer  . Arthritis Mother   . Heart disease Father   . Cancer Father 44       Leukemia  . Cancer Paternal Grandfather        Lung  . Ataxia Neg Hx   . Chorea Neg Hx   . Dementia Neg Hx   . Mental retardation Neg Hx   . Migraines Neg Hx   . Multiple sclerosis Neg Hx   .  Neurofibromatosis Neg Hx   . Neuropathy Neg Hx   . Parkinsonism Neg Hx   . Seizures Neg Hx   . Stroke Neg Hx   . Colon cancer Neg Hx     Social History   Tobacco Use  . Smoking status: Former Smoker    Packs/day: 0.10    Years: 30.00    Pack years: 3.00    Types: Cigarettes    Start date: 02/06/1973    Quit date: 12/09/2017    Years since quitting: 1.6  . Smokeless tobacco: Never Used  . Tobacco comment: Quit on 12/09/2017  Vaping Use  . Vaping Use: Never used  Substance Use Topics  . Alcohol use: Yes  Alcohol/week: 0.0 standard drinks    Comment: beer occ, history of ETOH abuse in remote past.   . Drug use: No    Home Medications Prior to Admission medications   Medication Sig Start Date End Date Taking? Authorizing Provider  amitriptyline (ELAVIL) 25 MG tablet Take 1 tablet (25 mg total) by mouth at bedtime. 07/02/19   Tomi Likens, Adam R, DO  atorvastatin (LIPITOR) 20 MG tablet TAKE 1 TABLET BY MOUTH AT BEDTIME Patient taking differently: Take 20 mg by mouth daily.  05/29/18   Derek Jack, MD  buPROPion (WELLBUTRIN SR) 150 MG 12 hr tablet Take 150 mg by mouth every morning.     [provider]  busPIRone (BUSPAR) 10 MG tablet Take 10 mg by mouth 2 (two) times daily.    [provider]  fluorouracil CALGB 13086 in sodium chloride 0.9 % 150 mL Inject 5,650 mg into the vein. Over 46 hours    [provider]  FLUoxetine (PROZAC) 20 MG capsule TAKE 1 CAPSULE BY MOUTH 2 TIMES A DAY Patient taking differently: Take 20 mg by mouth 2 (two) times daily.  06/14/16   [name], PA-C  gabapentin (NEURONTIN) 300 MG capsule TAKE 1 CAPSULE BY MOUTH THREE TIMES A DAY 07/04/18   Derek Jack, MD  HYDROcodone-acetaminophen (NORCO) 5-325 MG tablet Take 1 tablet by mouth every 6 (six) hours as needed for moderate pain. 08/01/19   Lockamy, Randi L, NP-C  IRINOTECAN HCL IV Inject 420 mg into the vein every 14 (fourteen) days.    [provider]    LEUCOVORIN CALCIUM IV Inject 944 mg into the vein every 14 (fourteen) days.    [provider]  lidocaine-prilocaine (EMLA) cream APPLY A SMALL AMOUNT OVER PORT SITE AND COVER WITH PLASTIC WRAP ONE HOUR PRIOR TO APPOINTMENT 11/04/18   Derek Jack, MD  loperamide (IMODIUM) 2 MG capsule TAKE 2 CAPSULES BY MOUTH TWICE DAILY Patient not taking: Reported on 07/23/2019 04/29/19   Derek Jack, MD  nystatin (MYCOSTATIN/NYSTOP) powder Apply 1 application topically 4 (four) times daily. 03/26/19   Sherwood Gambler, MD  omeprazole (PRILOSEC) 20 MG capsule Take 20 mg by mouth every morning.     [provider]  ondansetron (ZOFRAN) 8 MG tablet Take 1 tablet (8 mg total) by mouth 2 (two) times daily as needed for refractory nausea / vomiting. Start on day 3 after chemotherapy. Patient not taking: Reported on 07/23/2019 12/27/17   Higgs, Mathis Dad, MD  PROAIR HFA 108 (90 Base) MCG/ACT inhaler INHALE 2 PUFFS BY MOUTH EVERY 6 HOURS AS NEEDED FOR SHORTNESS OF BREATH/WHEEZING. Patient not taking: Reported on 07/23/2019 02/13/17   Raylene Everts, MD  prochlorperazine (COMPAZINE) 10 MG tablet Take 1 tablet (10 mg total) by mouth every 6 (six) hours as needed (NAUSEA). Patient not taking: Reported on 07/23/2019 12/27/17   Higgs, Mathis Dad, MD  tamsulosin (FLOMAX) 0.4 MG CAPS capsule TAKE 1 CAPSULE (0.4 MG TOTAL) BY MOUTH DAILY. 07/21/19   Derek Jack, MD  Vitamin D, Ergocalciferol, (DRISDOL) 1.25 MG (50000 UT) CAPS capsule Take 50,000 Units by mouth once a week. 12/05/18   [provider]  XARELTO 20 MG TABS tablet TAKE 1 TABLET BY MOUTH EVERY DAY WITH SUPPER 04/29/19   Derek Jack, MD    Allergies    Patient has no known allergies.  Review of Systems   Review of Systems  Neurological: Positive for weakness.  All other systems reviewed and are negative.   Physical Exam Updated Vital Signs BP Marland Kitchen)  153/92   Pulse 98   Temp (!) 97.4 F (36.3 C) (Oral)   Resp  (!) 24   Ht 6\' 2"  (1.88 m)   Wt 90 kg   SpO2 98%   BMI 25.47 kg/m   Physical Exam Vitals and nursing note reviewed.  Constitutional:      Appearance: Normal appearance.  HENT:     Head: Normocephalic and atraumatic.     Right Ear: External ear normal.     Left Ear: External ear normal.     Nose: Nose normal.     Mouth/Throat:     Mouth: Mucous membranes are dry.  Eyes:     Extraocular Movements: Extraocular movements intact.     Conjunctiva/sclera: Conjunctivae normal.     Pupils: Pupils are equal, round, and reactive to light.  Cardiovascular:     Rate and Rhythm: Normal rate and regular rhythm.     Pulses: Normal pulses.     Heart sounds: Normal heart sounds.  Pulmonary:     Effort: Pulmonary effort is normal.     Breath sounds: Normal breath sounds.  Abdominal:     General: Abdomen is flat. Bowel sounds are normal.     Palpations: Abdomen is soft.  Musculoskeletal:        General: Normal range of motion.     Cervical back: Normal range of motion and neck supple.  Skin:    General: Skin is warm.     Capillary Refill: Capillary refill takes less than 2 seconds.  Neurological:     Mental Status: He is alert. He is disoriented.  Psychiatric:        Mood and Affect: Mood normal.        Behavior: Behavior normal.     ED Results / Procedures / Treatments   Labs (all labs ordered are listed, but only abnormal results are displayed) Labs Reviewed  CBC WITH DIFFERENTIAL/PLATELET - Abnormal; Notable for the following components:      Result Value   WBC 3.5 (*)    RBC 3.93 (*)    Hemoglobin 12.4 (*)    HCT 36.7 (*)    RDW 15.8 (*)    Lymphs Abs 0.5 (*)    All other components within normal limits  COMPREHENSIVE METABOLIC PANEL - Abnormal; Notable for the following components:   Sodium 128 (*)    Chloride 91 (*)    Glucose, Bld 144 (*)    Calcium 8.6 (*)    Albumin 3.0 (*)    AST 13 (*)    All other components within normal limits  URINALYSIS, ROUTINE W REFLEX  MICROSCOPIC - Abnormal; Notable for the following components:   APPearance CLOUDY (*)    pH 9.0 (*)    Bilirubin Urine SMALL (*)    Ketones, ur 5 (*)    Protein, ur 100 (*)    Nitrite POSITIVE (*)    Leukocytes,Ua LARGE (*)    WBC, UA >50 (*)    Bacteria, UA FEW (*)    Non Squamous Epithelial 0-5 (*)    All other components within normal limits  CBG MONITORING, ED - Abnormal; Notable for the following components:   Glucose-Capillary 141 (*)    All other components within normal limits  SARS CORONAVIRUS 2 BY RT PCR (HOSPITAL ORDER, Organ LAB)  URINE CULTURE  CULTURE, BLOOD (ROUTINE X 2)  CULTURE, BLOOD (ROUTINE X 2)  AMMONIA  PROTIME-INR  LACTIC ACID, PLASMA  LACTIC ACID, PLASMA  EKG EKG Interpretation  Date/Time:  Wednesday August 06 2019 15:05:45 EDT Ventricular Rate:  99 PR Interval:    QRS Duration: 111 QT Interval:  371 QTC Calculation: 477 R Axis:   -70 Text Interpretation: Sinus rhythm Left anterior fascicular block Abnormal R-wave progression, late transition Borderline prolonged QT interval No significant change since last tracing Confirmed by Isla Pence [phone]) on 07/28/2019 3:12:25 PM   Radiology DG Chest 2 View  Result Date: 08/01/2019 CLINICAL DATA:  Altered level of consciousness. EXAM: CHEST - 2 VIEW COMPARISON:  January 07, 2018 FINDINGS: There is a stable left subclavian Port-A-Cath in place. There is no pneumothorax or large pleural effusion. The lung volumes are slightly low. There is no acute osseous abnormality. IMPRESSION: No active cardiopulmonary disease. Electronically Signed   By: Constance Holster M.D.   On: 07/31/2019 15:59   CT Head Wo Contrast  Result Date: 07/27/2019 CLINICAL DATA:  Altered mental status. EXAM: CT HEAD WITHOUT CONTRAST TECHNIQUE: Contiguous axial images were obtained from the base of the skull through the vertex without intravenous contrast. COMPARISON:  January 25, 2019. FINDINGS: Brain:  Mild chronic ischemic white matter disease is noted. Stable right temporal and right frontal encephalomalacia is noted consistent with old infarction. No mass effect or midline shift is noted. Ventricular size is within normal limits. There is no evidence of mass lesion, hemorrhage or acute infarction. Vascular: No hyperdense vessel or unexpected calcification. Skull: Normal. Negative for fracture or focal lesion. Sinuses/Orbits: Chronic mucosal thickening of right maxillary sinus. Other: None. IMPRESSION: Mild chronic ischemic white matter disease. Stable right temporal and right frontal encephalomalacia consistent with old infarction. No acute intracranial abnormality seen. Electronically Signed   By: Marijo Conception M.D.   On: 07/08/2019 15:42    Procedures Procedures (including critical care time)  Medications Ordered in ED Medications  cefTRIAXone (ROCEPHIN) 1 g in sodium chloride 0.9 % 100 mL IVPB (has no administration in time range)  sodium chloride 0.9 % bolus 1,000 mL (0 mLs Intravenous Stopped 08/05/2019 1751)    ED Course  I have reviewed the triage vital signs and the nursing notes.  Pertinent labs & imaging results that were available during my care of the patient were reviewed by me and considered in my medical decision making (see chart for details).    MDM Rules/Calculators/A&P                          Pt does have a UTI which is the likely cause for his confusion.  Pt has urinated multiple times on himself.   Pt given IV rocephin.  He was d/w Dr. Denton Brick (triad) for admission.  Final Clinical Impression(s) / ED Diagnoses Final diagnoses:  Hyponatremia  Acute metabolic encephalopathy  Acute cystitis without hematuria  Colon cancer metastasized to multiple sites [facility])    Rx / DC Orders ED Discharge Orders    None       Isla Pence, MD 07/10/2019 1907

## 2019-08-06 NOTE — H&P (Addendum)
History and Physical    Erik [name] ZMO:[id number] DOB: [birth year] DOA: 07/16/2019  PCP: Alanson Puls [facility]   Patient coming from: Home  I have personally briefly reviewed patient's old medical records in [place]  Chief Complaint: Confusion  HPI: Erik [name] is a 64 y.o. male with medical history significant for metastatic lung cancer, COPD, alcohol abuse, psoriasis, coronary artery disease. Brought to the ED via EMS, retractions, patient's neighbor went to check on him and found him to be confused.  Neighbors also reported that patient has no family, and is unable to take care of himself. At the time of my evaluation, patient is awake and alert, answers a few questions appropriately, but otherwise he is unable to remember or is confused.  He reports he has been having pain with urination over the past 4 weeks.  He reports intermittent vomiting, intermittent loose stools, he has chronic abdominal pain.   He reports that he has been falling, he is unable to tell me what he has been eating.  Apparently in the ED there was dried stool on patient's legs.  ED Course: Temperature 97.6, heart rate 90s 200s, blood pressure systolic 035-465K.  O2 sats greater than 95% on room air.  WBC 3.5.  Sodium 128.  Ammonia level 22.  INR 1.2.  UA shows positive nitrite and large leukocytes with few bacteria.  Head CT shows old infarct without acute abnormality.  Two-view chest x-ray without acute abnormality.  Covid test negative.  IV ceftriaxone started, 1 L bolus given.  Hospitalist admit for altered mental status, UTI.  Review of Systems: As per HPI all other systems reviewed and negative.  Past Medical History:  Diagnosis Date  . Abnormal stress echocardiogram   . Adenocarcinoma of transverse colon ([place]) 02/10/2015  . Alcohol abuse    Heavy Use up until 2010  . Anxiety   . Blood transfusion without reported diagnosis   . Cancer of overlapping sites of colon metastatic to  intra-abdominal lymph node ([place]) 12/26/2017  . COPD (chronic obstructive pulmonary disease) ([place])   . Depression   . Emphysema of lung ([place])   . GERD (gastroesophageal reflux disease)   . Head trauma 2001   closed head injury; coma for 4 weeks  . Hypercholesterolemia   . Hypertension   . ING HERN W/GANGREN RECUR UNILAT/UNSPEC ING HERN 04/21/2009   Qualifier: Diagnosis of  By: Verl Blalock, MD, Delanna Ahmadi PTSD (post-traumatic stress disorder)   . Pulmonary nodule 06/14/2016  . SAH (subarachnoid hemorrhage) ([place]) 08/31/2012  . SDH (subdural hematoma) ([place]) 08/31/2012    Past Surgical History:  Procedure Laterality Date  . BIOPSY  01/25/2016   Procedure: BIOPSY;  Surgeon: Danie Binder, MD;  [facility];  Service: Endoscopy;;  ileum;   . cardiac cath    . COLONOSCOPY  2011   Dr. Oneida Alar: multiple adenomas and hyperplastic polyps  . COLONOSCOPY WITH PROPOFOL N/A 01/12/2015   Procedure: COLONOSCOPY WITH PROPOFOL;  Surgeon: Danie Binder, MD;  [facility];  Service: Endoscopy;  Laterality: N/A;  1030  . COLONOSCOPY WITH PROPOFOL N/A 01/25/2016   Procedure: COLONOSCOPY WITH PROPOFOL;  Surgeon: Danie Binder, MD;  [facility];  Service: Endoscopy;  Laterality: N/A;  1230  . CRANIOTOMY  2001  . ESOPHAGOGASTRODUODENOSCOPY (EGD) WITH PROPOFOL N/A 01/12/2015   Procedure: ESOPHAGOGASTRODUODENOSCOPY (EGD) WITH PROPOFOL;  Surgeon: Danie Binder, MD;  [facility];  Service: Endoscopy;  Laterality: N/A;  . head injury surgery    . HERNIA REPAIR Right 2012   Inguinal- [place] Na  . KIDNEY SURGERY     >30 years ago  . LAPAROSCOPIC RIGHT HEMI COLECTOMY Left 02/10/2015   Procedure: LAPAROSCOPIC THEN OPEN RIGHT HEMI COLECTOMY;  Surgeon: [name], MD;  Location: [facility];  Service: General;  Laterality: Left;  . LAPAROTOMY N/A 12/19/2017   Procedure: EXPLORATORY LAPAROTOMY;  Surgeon: Aviva Signs, MD;  Location: [facility];  Service: General;  Laterality:  N/A;  . OPEN REDUCTION INTERNAL FIXATION (ORIF) DISTAL RADIAL FRACTURE Right 10/18/2018   Procedure: OPEN REDUCTION INTERNAL FIXATION (ORIF) RIGHT DISTAL RADIAL FRACTURE;  Surgeon: [name], MD;  Location: [place];  Service: Orthopedics;  Laterality: Right;  block in preop  . OPEN REDUCTION INTERNAL FIXATION (ORIF) DISTAL RADIAL FRACTURE Right 12/20/2018   Procedure: OPEN REDUCTION INTERNAL FIXATION (ORIF) DISTAL RADIUS AND ULNA FRACTURE;  Surgeon: [name], MD;  Location: [place];  Service: Orthopedics;  Laterality: Right;  block  . POLYPECTOMY  01/25/2016   Procedure: POLYPECTOMY;  Surgeon: Danie Binder, MD;  [facility];  Service: Endoscopy;;  colon  . PORT-A-CATH REMOVAL N/A 02/26/2017   Procedure: MINOR REMOVAL PORT-A-CATH;  Surgeon: Aviva Signs, MD;  [facility];  Service: General;  Laterality: N/A;  Pt to arrive at [place] N/A 03/24/2015   Procedure: INSERTION PORT-A-CATH;  Surgeon: Jules Husbands, MD;  Location: [facility];  Service: General;  Laterality: N/A;  . PORTACATH PLACEMENT Left 01/07/2018   Procedure: INSERTION PORT A CATH (ATTACHED CATHETER IN LEFT SUBCLAVIAN);  Surgeon: Aviva Signs, MD;  Location[facility];  Service: General;  Laterality: Left;     reports that he quit smoking about 19 months ago. His smoking use included cigarettes. He started smoking about 46 years ago. He has a 3.00 pack-year smoking history. He has never used smokeless tobacco. He reports current alcohol use. He reports that he does not use drugs.  No Known Allergies  Family History  Problem Relation Age of Onset  . Pulmonary embolism Mother   . Breast cancer Mother   . Cancer Mother        breast cancer  . Arthritis Mother   . Heart disease Father   . Cancer Father 51       Leukemia  . Cancer Paternal Grandfather        Lung  . Ataxia Neg Hx   . Chorea Neg Hx   . Dementia Neg Hx   . Mental retardation Neg Hx   .  Migraines Neg Hx   . Multiple sclerosis Neg Hx   . Neurofibromatosis Neg Hx   . Neuropathy Neg Hx   . Parkinsonism Neg Hx   . Seizures Neg Hx   . Stroke Neg Hx   . Colon cancer Neg Hx     Prior to Admission medications   Medication Sig Start Date End Date Taking? Authorizing Provider  amitriptyline (ELAVIL) 25 MG tablet Take 1 tablet (25 mg total) by mouth at bedtime. 07/02/19   Tomi Likens, Adam R, DO  atorvastatin (LIPITOR) 20 MG tablet TAKE 1 TABLET BY MOUTH AT BEDTIME Patient taking differently: Take 20 mg by mouth daily.  05/29/18   Derek Jack, MD  buPROPion (WELLBUTRIN SR) 150 MG 12 hr tablet Take 150 mg by mouth every morning.     [provider]  busPIRone (BUSPAR) 10 MG tablet Take 10 mg by  mouth 2 (two) times daily.    [provider]  fluorouracil CALGB 16606 in sodium chloride 0.9 % 150 mL Inject 5,650 mg into the vein. Over 46 hours    [provider]  FLUoxetine (PROZAC) 20 MG capsule TAKE 1 CAPSULE BY MOUTH 2 TIMES A DAY Patient taking differently: Take 20 mg by mouth 2 (two) times daily.  06/14/16   [name], PA-C  gabapentin (NEURONTIN) 300 MG capsule TAKE 1 CAPSULE BY MOUTH THREE TIMES A DAY 07/04/18   Derek Jack, MD  HYDROcodone-acetaminophen (NORCO) 5-325 MG tablet Take 1 tablet by mouth every 6 (six) hours as needed for moderate pain. 08/01/19   Lockamy, Randi L, NP-C  IRINOTECAN HCL IV Inject 420 mg into the vein every 14 (fourteen) days.    [provider]  LEUCOVORIN CALCIUM IV Inject 944 mg into the vein every 14 (fourteen) days.    [provider]  lidocaine-prilocaine (EMLA) cream APPLY A SMALL AMOUNT OVER PORT SITE AND COVER WITH PLASTIC WRAP ONE HOUR PRIOR TO APPOINTMENT 11/04/18   Derek Jack, MD  loperamide (IMODIUM) 2 MG capsule TAKE 2 CAPSULES BY MOUTH TWICE DAILY Patient not taking: Reported on 07/23/2019 04/29/19   Derek Jack, MD  nystatin (MYCOSTATIN/NYSTOP) powder Apply 1  application topically 4 (four) times daily. 03/26/19   Sherwood Gambler, MD  omeprazole (PRILOSEC) 20 MG capsule Take 20 mg by mouth every morning.     [provider]  ondansetron (ZOFRAN) 8 MG tablet Take 1 tablet (8 mg total) by mouth 2 (two) times daily as needed for refractory nausea / vomiting. Start on day 3 after chemotherapy. Patient not taking: Reported on 07/23/2019 12/27/17   Higgs, Mathis Dad, MD  PROAIR HFA 108 (90 Base) MCG/ACT inhaler INHALE 2 PUFFS BY MOUTH EVERY 6 HOURS AS NEEDED FOR SHORTNESS OF BREATH/WHEEZING. Patient not taking: Reported on 07/23/2019 02/13/17   Raylene Everts, MD  prochlorperazine (COMPAZINE) 10 MG tablet Take 1 tablet (10 mg total) by mouth every 6 (six) hours as needed (NAUSEA). Patient not taking: Reported on 07/23/2019 12/27/17   Higgs, Mathis Dad, MD  tamsulosin (FLOMAX) 0.4 MG CAPS capsule TAKE 1 CAPSULE (0.4 MG TOTAL) BY MOUTH DAILY. 07/21/19   Derek Jack, MD  Vitamin D, Ergocalciferol, (DRISDOL) 1.25 MG (50000 UT) CAPS capsule Take 50,000 Units by mouth once a week. 12/05/18   [provider]  XARELTO 20 MG TABS tablet TAKE 1 TABLET BY MOUTH EVERY DAY WITH SUPPER 04/29/19   Derek Jack, MD    Physical Exam: Vitals:   07/29/2019 1751 08/04/2019 1800 07/30/2019 1830 07/22/2019 1900  BP: (!) 147/80 (!) 154/90 (!) 156/97 (!) 153/92  Pulse: 97 98 100 98  Resp: 20   (!) 24  Temp: (!) 97.4 F (36.3 C)     TempSrc: Oral     SpO2: 99% 100% 98% 98%  Weight:      Height:        Constitutional: NAD, calm, comfortable Vitals:   07/11/2019 1751 07/19/2019 1800 07/29/2019 1830 07/25/2019 1900  BP: (!) 147/80 (!) 154/90 (!) 156/97 (!) 153/92  Pulse: 97 98 100 98  Resp: 20   (!) 24  Temp: (!) 97.4 F (36.3 C)     TempSrc: Oral     SpO2: 99% 100% 98% 98%  Weight:      Height:       Eyes: PERRL, lids and conjunctivae normal ENMT: Mucous membranes are dry Neck: normal, supple, no masses, no thyromegaly Respiratory: clear to  auscultation  bilaterally, no wheezing, no crackles. Normal respiratory effort. No accessory muscle use.  Cardiovascular: Regular rate and rhythm,  No extremity edema. 2+ pedal pulses.  Abdomen: Moderate right-sided abdominal tenderness, no masses palpated. No hepatosplenomegaly. Bowel sounds positive.  Musculoskeletal: no clubbing / cyanosis. No joint deformity upper and lower extremities. Good ROM, no contractures. Normal muscle tone.  Skin: no rashes, lesions, ulcers. No induration Neurologic: No apparent cranial nerve abnormality, moving extremities spontaneously. Psychiatric: Alert and oriented to person and place only.  Does not know why he is in the hospital or how he got here.  Labs on Admission: I have personally reviewed following labs and imaging studies  CBC: Recent Labs  Lab 07/12/2019 1551  WBC 3.5*  NEUTROABS 2.4  HGB 12.4*  HCT 36.7*  MCV 93.4  PLT 245   Basic Metabolic Panel: Recent Labs  Lab 07/24/2019 1551  NA 128*  K 3.7  CL 91*  CO2 24  GLUCOSE 144*  BUN 10  CREATININE 0.78  CALCIUM 8.6*   Liver Function Tests: Recent Labs  Lab 07/30/2019 1551  AST 13*  ALT 11  ALKPHOS 99  BILITOT 0.9  PROT 6.5  ALBUMIN 3.0*    Recent Labs  Lab 07/17/2019 1551  AMMONIA 22   Coagulation Profile: Recent Labs  Lab 07/19/2019 1551  INR 1.2   CBG: Recent Labs  Lab 07/10/2019 1547  GLUCAP 141*    Urine analysis:    Component Value Date/Time   COLORURINE YELLOW 07/26/2019 1749   APPEARANCEUR CLOUDY (A) 07/09/2019 1749   LABSPEC 1.015 07/28/2019 1749   PHURINE 9.0 (H) 07/23/2019 1749   GLUCOSEU NEGATIVE 07/20/2019 1749   HGBUR NEGATIVE 08/05/2019 1749   BILIRUBINUR SMALL (A) 07/16/2019 1749   KETONESUR 5 (A) 07/27/2019 1749   PROTEINUR 100 (A) 07/15/2019 1749   UROBILINOGEN 0.2 09/28/2009 1118   NITRITE POSITIVE (A) 07/11/2019 1749   LEUKOCYTESUR LARGE (A) 08/02/2019 1749    Radiological Exams on Admission: DG Chest 2 View  Result Date: 07/15/2019 CLINICAL DATA:   Altered level of consciousness. EXAM: CHEST - 2 VIEW COMPARISON:  January 07, 2018 FINDINGS: There is a stable left subclavian Port-A-Cath in place. There is no pneumothorax or large pleural effusion. The lung volumes are slightly low. There is no acute osseous abnormality. IMPRESSION: No active cardiopulmonary disease. Electronically Signed   By: Constance Holster M.D.   On: 08/04/2019 15:59   CT Head Wo Contrast  Result Date: 08/05/2019 CLINICAL DATA:  Altered mental status. EXAM: CT HEAD WITHOUT CONTRAST TECHNIQUE: Contiguous axial images were obtained from the base of the skull through the vertex without intravenous contrast. COMPARISON:  January 25, 2019. FINDINGS: Brain: Mild chronic ischemic white matter disease is noted. Stable right temporal and right frontal encephalomalacia is noted consistent with old infarction. No mass effect or midline shift is noted. Ventricular size is within normal limits. There is no evidence of mass lesion, hemorrhage or acute infarction. Vascular: No hyperdense vessel or unexpected calcification. Skull: Normal. Negative for fracture or focal lesion. Sinuses/Orbits: Chronic mucosal thickening of right maxillary sinus. Other: None. IMPRESSION: Mild chronic ischemic white matter disease. Stable right temporal and right frontal encephalomalacia consistent with old infarction. No acute intracranial abnormality seen. Electronically Signed   By: Marijo Conception M.D.   On: 07/15/2019 15:42    EKG: Independently reviewed.  Sinus rhythm, QTC 477.  LAFB.  No significant change from prior.  Assessment/Plan Active Problems:   Acute metabolic encephalopathy  Acute metabolic  encephalopathy-confused, stable vitals, WBC 3.5.  Rules out for sepsis.  UA suggestive of UTI with positive nitrites large leukocytes few bacteria, likely etiology of patient's encephalopathy, he appears dehydrated as well.  Head CT, two-view x-ray unremarkable.  Patient unable to take care of  himself. -Continue IV ceftriaxone -Follow-up urine and blood cultures obtained in the ED cultures  Hyponatremia-sodium 128, baseline low to mid 130s.  Likely due to poor p.o. intake, reports intermittent vomiting and diarrhea.  Diarrhea is chronic. -1 L bolus given, continue N/s 100cc/hr x 20 hrs -BMP in the morning.  Falls-head CT shows chronic infarcts no acute abnormality.  Unable to take care of self. -PT eval -Will need placement, TOC consult.  Colon cancer with metastasis-follows with Dr. Delton Coombes, currently on chemotherapy.  History of pulmonary embolism -Continue home Xarelto  COPD-stable.  History of coronary artery disease  DVT prophylaxis: Lovenox Code Status: Full code Family Communication: None at bedside Disposition Plan: ~ 2 days, pending improvement in encephalopathy, patient would benefit from rehab stay. Consults called: None. Admission status: Inpatient, MedSurg. I certify that at the point of admission it is my clinical judgment that the patient will require inpatient hospital care spanning beyond 2 midnights from the point of admission due to high intensity of service, high risk for further deterioration and high frequency of surveillance required. The following factors support the patient status of inpatient: Metabolic encephalopathy requiring IV antibiotics.   Bethena Roys MD [facility]  08/02/2019, 7:46 PM

## 2019-08-06 NOTE — ED Notes (Signed)
Pt had small liquid brown bm, cleaned pt, changed gown, pt to floor.

## 2019-08-06 NOTE — ED Triage Notes (Signed)
EMS reports pt's neighbor went to check on him today and found him to be confused.  Reports strong smell of urine and urine very concentrated.  Reports pt hasn't bathed in 8 months.  Neighbors reported to ems that pt has no family, has colon cancer, and is requesting home health services for pt.  EDP at bedside.   Also reports diarrhea.

## 2019-08-07 LAB — HIV ANTIBODY (ROUTINE TESTING W REFLEX): HIV Screen 4th Generation wRfx: NONREACTIVE

## 2019-08-07 LAB — BASIC METABOLIC PANEL
Anion gap: 11 (ref 5–15)
BUN: 10 mg/dL (ref 8–23)
CO2: 27 mmol/L (ref 22–32)
Calcium: 8.6 mg/dL — ABNORMAL LOW (ref 8.9–10.3)
Chloride: 94 mmol/L — ABNORMAL LOW (ref 98–111)
Creatinine, Ser: 0.77 mg/dL (ref 0.61–1.24)
GFR calc Af Amer: >60 mL/min (ref >60)
GFR calc non Af Amer: >60 mL/min (ref >60)
Glucose, Bld: 115 mg/dL — ABNORMAL HIGH (ref 70–99)
Potassium: 4.1 mmol/L (ref 3.5–5.1)
Sodium: 132 mmol/L — ABNORMAL LOW (ref 135–145)

## 2019-08-07 MED ORDER — LOPERAMIDE HCL 2 MG PO CAPS
4.0000 mg | ORAL_CAPSULE | Freq: Two times a day (BID) | ORAL | Status: DC
  Administered 2019-08-07 – 2019-08-11 (×10): 4 mg via ORAL
  Filled 2019-08-07 (×11): qty 2

## 2019-08-07 MED ORDER — GABAPENTIN 300 MG PO CAPS
300.0000 mg | ORAL_CAPSULE | Freq: Three times a day (TID) | ORAL | Status: DC
  Administered 2019-08-07 – 2019-08-11 (×14): 300 mg via ORAL
  Filled 2019-08-07 (×15): qty 1

## 2019-08-07 MED ORDER — TAMSULOSIN HCL 0.4 MG PO CAPS
0.4000 mg | ORAL_CAPSULE | Freq: Every day | ORAL | Status: DC
  Administered 2019-08-07 – 2019-08-11 (×5): 0.4 mg via ORAL
  Filled 2019-08-07 (×6): qty 1

## 2019-08-07 MED ORDER — BUPROPION HCL ER (XL) 150 MG PO TB24
150.0000 mg | ORAL_TABLET | Freq: Every morning | ORAL | Status: DC
  Administered 2019-08-07 – 2019-08-11 (×5): 150 mg via ORAL
  Filled 2019-08-07 (×6): qty 1

## 2019-08-07 MED ORDER — SODIUM CHLORIDE 0.9 % IV SOLN: INTRAVENOUS | Status: AC

## 2019-08-07 MED ORDER — PANTOPRAZOLE SODIUM 40 MG PO TBEC
40.0000 mg | DELAYED_RELEASE_TABLET | Freq: Every day | ORAL | Status: DC
  Administered 2019-08-07 – 2019-08-11 (×5): 40 mg via ORAL
  Filled 2019-08-07 (×6): qty 1

## 2019-08-07 MED ORDER — RIVAROXABAN 20 MG PO TABS
20.0000 mg | ORAL_TABLET | Freq: Every day | ORAL | Status: DC
  Administered 2019-08-07 – 2019-08-11 (×5): 20 mg via ORAL
  Filled 2019-08-07 (×5): qty 1

## 2019-08-07 MED ORDER — AMITRIPTYLINE HCL 25 MG PO TABS
25.0000 mg | ORAL_TABLET | Freq: Every day | ORAL | Status: DC
  Administered 2019-08-07 – 2019-08-11 (×5): 25 mg via ORAL
  Filled 2019-08-07 (×5): qty 1

## 2019-08-07 NOTE — Evaluation (Signed)
Physical Therapy Evaluation Patient Details Name: Erik [name] MRN: [id number] DOB: [birth year] Today's Date: 08/07/2019   History of Present Illness  Erik [name] is a 64 y.o. male with medical history significant for metastatic lung cancer, COPD, alcohol abuse, psoriasis, coronary artery disease.Brought to the ED via EMS, retractions, patient's neighbor went to check on him and found him to be confused.  Neighbors also reported that patient has no family, and is unable to take care of himself.At the time of my evaluation, patient is awake and alert, answers a few questions appropriately, but otherwise he is unable to remember or is confused.  He reports he has been having pain with urination over the past 4 weeks.  He reports intermittent vomiting, intermittent loose stools, he has chronic abdominal pain.  He reports that he has been falling, he is unable to tell me what he has been eating.    Clinical Impression  Patient unable to complete sit to stands using his cane due to weakness, required use of RW for safety demonstrating slow labored cadence with near loss of balance due to fatigue and weakness.  Patient limited to ambulation in room and incontinent of stool before making it to bathroom.  Patient put back to bed after sitting on commode in bathroom - nursing staff in room to finish cleaning patient.  Patient will benefit from continued physical therapy in hospital and recommended venue below to increase strength, balance, endurance for safe ADLs and gait.    Follow Up Recommendations SNF    Equipment Recommendations  None recommended by PT    Recommendations for Other Services       Precautions / Restrictions Precautions Precautions: Fall Restrictions Weight Bearing Restrictions: No      Mobility  Bed Mobility Overal bed mobility: Needs Assistance Bed Mobility: Supine to Sit;Sit to Supine     Supine to sit: Supervision Sit to supine: Supervision       Transfers Overall transfer level: Needs assistance Equipment used: Rolling walker (2 wheeled) Transfers: Sit to/from [facility] Sit to Stand: Min assist Stand pivot transfers: Min assist       General transfer comment: unable to sit to stand using his cane, had to use RW due to weakness  Ambulation/Gait Ambulation/Gait assistance: Min assist Gait Distance (Feet): 20 Feet Assistive device: Rolling walker (2 wheeled) Gait Pattern/deviations: Decreased step length - right;Decreased step length - left;Decreased stride length Gait velocity: decreased   General Gait Details: slow labored cadence having to lean over RW for support due to weakness, limited secondary to c/o fatigue  Stairs            Wheelchair Mobility    Modified Rankin (Stroke Patients Only)       Balance Overall balance assessment: Needs assistance Sitting-balance support: Feet supported;No upper extremity supported Sitting balance-Leahy Scale: Fair Sitting balance - Comments: fair/good seated at EOB   Standing balance support: During functional activity;Single extremity supported Standing balance-Leahy Scale: Poor Standing balance comment: fair using RW                             Pertinent Vitals/Pain Pain Assessment: 0-10 Pain Score: 8  Pain Location: abdomen Pain Descriptors / Indicators: Aching;Sore Pain Intervention(s): Limited activity within patient's tolerance;Monitored during session    [place] expects to be discharged to:: Private residence Living Arrangements: Alone Available Help at Discharge: Friend(s);Available PRN/intermittently Type of Home: House Home Access: Stairs to  enter Entrance Stairs-Rails: Left;Right (to wide to reach both) Entrance Stairs-Number of Steps: 8 Home Layout: Two level;Able to live on main level with bedroom/bathroom Home Equipment: Shower seat;Bedside commode;Wheelchair - [employer] - 2 wheels;Hospital  bed      Prior Function Level of Independence: Independent with assistive device(s)         Comments: Household and short distanced community ambulator with [facility]     Hand Dominance        Extremity/Trunk Assessment   Upper Extremity Assessment Upper Extremity Assessment: Generalized weakness    Lower Extremity Assessment Lower Extremity Assessment: Generalized weakness    Cervical / Trunk Assessment Cervical / Trunk Assessment: Normal  Communication   Communication: No difficulties  Cognition Arousal/Alertness: Awake/alert Behavior During Therapy: WFL for tasks assessed/performed Overall Cognitive Status: Within Functional Limits for tasks assessed                                        General Comments      Exercises     Assessment/Plan    PT Assessment Patient needs continued PT services  PT Problem List Decreased strength;Decreased activity tolerance;Decreased balance;Decreased mobility       PT Treatment Interventions Balance training;Gait training;Stair training;Functional mobility training;Therapeutic activities;Therapeutic exercise;Patient/family education    PT Goals (Current goals can be found in the Care Plan section)  Acute Rehab PT Goals Patient Stated Goal: return home after rehab PT Goal Formulation: With patient Time For Goal Achievement: 08/21/19 Potential to Achieve Goals: Good    Frequency Min 3X/week   Barriers to discharge        Co-evaluation               AM-PAC PT "6 Clicks" Mobility  Outcome Measure Help needed turning from your back to your side while in a flat bed without using bedrails?: None Help needed moving from lying on your back to sitting on the side of a flat bed without using bedrails?: A Little Help needed moving to and from a bed to a chair (including a wheelchair)?: A Lot Help needed standing up from a chair using your arms (e.g., wheelchair or bedside chair)?: A Little Help needed to  walk in hospital room?: A Lot Help needed climbing 3-5 steps with a railing? : A Lot 6 Click Score: 16    End of Session   Activity Tolerance: Patient tolerated treatment well;Patient limited by fatigue Patient left: in bed;with call bell/phone within reach Nurse Communication: Mobility status PT Visit Diagnosis: Unsteadiness on feet (R26.81);Other abnormalities of gait and mobility (R26.89);Muscle weakness (generalized) (M62.81)    Time: 1950-9326 PT Time Calculation (min) (ACUTE ONLY): 36 min   Charges:   PT Evaluation $PT Eval Moderate Complexity: 1 Mod PT Treatments $Therapeutic Activity: 23-37 mins        11:32 AM, 08/07/19 Lonell Grandchild, MPT Physical Therapist with [facility] [phone] office [phone] mobile phone

## 2019-08-07 NOTE — Progress Notes (Signed)
PROGRESS NOTE    Erik [name]  WUJ:[id number] DOB: Mar 08, 1955 DOA: 07/14/2019 PCP: Alanson Puls [facility]   Brief Narrative:  Per HPI:  Erik [name] is a 64 y.o. male with medical history significant for metastatic lung cancer, COPD, alcohol abuse, psoriasis, coronary artery disease. Brought to the ED via EMS, retractions, patient's neighbor went to check on him and found him to be confused.  Neighbors also reported that patient has no family, and is unable to take care of himself. At the time of my evaluation, patient is awake and alert, answers a few questions appropriately, but otherwise he is unable to remember or is confused.  He reports he has been having pain with urination over the past 4 weeks.  He reports intermittent vomiting, intermittent loose stools, he has chronic abdominal pain.   He reports that he has been falling, he is unable to tell me what he has been eating.  Apparently in the ED there was dried stool on patient's legs.  ED Course: Temperature 97.6, heart rate 90s 200s, blood pressure systolic 956-213Y.  O2 sats greater than 95% on room air.  WBC 3.5.  Sodium 128.  Ammonia level 22.  INR 1.2.  UA shows positive nitrite and large leukocytes with few bacteria.  Head CT shows old infarct without acute abnormality.  Two-view chest x-ray without acute abnormality.  Covid test negative.  IV ceftriaxone started, 1 L bolus given.  Hospitalist admit for altered mental status, UTI.   Assessment & Plan:   Active Problems:   Acute metabolic encephalopathy   Acute metabolic encephalopathy likely secondary to UTI-improving -Continue IV Rocephin -Resume some home medications and monitor -Urine cultures pending with blood cultures negative thus far  Hyponatremia -Likely secondary to multiple factors of poor p.o. intake as well as intermittent nausea, vomiting, and diarrhea -Improving after saline bolus, will continue on IV normal saline, time-limited for 12 more  hours and repeat BMP in a.m.  Chronic diarrhea -Continue home Imodium -No further need for evaluation  Falls/weakness -Appreciate PT evaluation with noted need for SNF/rehab -TOC will work on placement -Unsafe plan to go back home  Colon cancer with metastasis -Follows with Dr. Delton Coombes and is currently on chemotherapy  History of pulmonary embolism -Resume home Xarelto  COPD-stable -No acute bronchospasms noted  History of CAD  DVT prophylaxis: Xarelto Code Status: Full code Family Communication: None at bedside Disposition Plan:   Status is: Inpatient  Remains inpatient appropriate because:Unsafe d/c plan, IV treatments appropriate due to intensity of illness or inability to take PO and Inpatient level of care appropriate due to severity of illness   Dispo: The patient is from: Home              Anticipated d/c is to: SNF              Anticipated d/c date is: 2 days              Patient currently is not medically stable to d/c.  Consultants:   None  Procedures:   See below  Antimicrobials:  Anti-infectives (From admission, onward)   Start     Dose/Rate Route Frequency Ordered Stop   08/07/19 2000  cefTRIAXone (ROCEPHIN) 1 g in sodium chloride 0.9 % 100 mL IVPB     Discontinue     1 g 200 mL/hr over 30 Minutes Intravenous Every 24 hours 07/21/2019 2133     07/25/2019 1900  cefTRIAXone (ROCEPHIN) 1 g in sodium chloride  0.9 % 100 mL IVPB        1 g 200 mL/hr over 30 Minutes Intravenous  Once 07/30/2019 1850 07/22/2019 2055       Subjective: Patient seen and evaluated today with no new acute complaints or concerns. No acute concerns or events noted overnight.  He was noted to have diarrhea while working with PT today.  Objective: Vitals:   08/02/2019 2057 08/04/2019 2145 08/07/19 0212 08/07/19 0528  BP: (!) 154/86 (!) 151/93 133/83 130/77  Pulse: (!) 106 (!) 102 100 99  Resp: (!) 23 20 20 20   Temp: 97.9 F (36.6 C) 97.9 F (36.6 C) 98.7 F (37.1 C) 98.2 F  (36.8 C)  TempSrc: Oral Oral Oral Oral  SpO2: 96% 97% 99% 98%  Weight:  88.7 kg    Height:  6\' 2"  (1.88 m)      Intake/Output Summary (Last 24 hours) at 08/07/2019 1355 Last data filed at 08/07/2019 0616 Gross per 24 hour  Intake 1958.76 ml  Output --  Net 1958.76 ml   Filed Weights   07/14/2019 1503 07/10/2019 2145  Weight: 90 kg 88.7 kg    Examination:  General exam: Appears calm and comfortable  Respiratory system: Clear to auscultation. Respiratory effort normal. Cardiovascular system: S1 & S2 heard, RRR. No JVD, murmurs, rubs, gallops or clicks. No pedal edema. Gastrointestinal system: Abdomen is nondistended, soft and nontender. No organomegaly or masses felt. Normal bowel sounds heard. Central nervous system: Alert and awake. Extremities: No edema Skin: No rashes, lesions or ulcers Psychiatry: Difficult to assess    Data Reviewed: I have personally reviewed following labs and imaging studies  CBC: Recent Labs  Lab 07/13/2019 1551  WBC 3.5*  NEUTROABS 2.4  HGB 12.4*  HCT 36.7*  MCV 93.4  PLT 546   Basic Metabolic Panel: Recent Labs  Lab 07/22/2019 1551 08/07/19 0706  NA 128* 132*  K 3.7 4.1  CL 91* 94*  CO2 24 27  GLUCOSE 144* 115*  BUN 10 10  CREATININE 0.78 0.77  CALCIUM 8.6* 8.6*   GFR: Estimated Creatinine Clearance: 112.7 mL/min (by C-G formula based on SCr of 0.77 mg/dL). Liver Function Tests: Recent Labs  Lab 07/17/2019 1551  AST 13*  ALT 11  ALKPHOS 99  BILITOT 0.9  PROT 6.5  ALBUMIN 3.0*   No results for input(s): LIPASE, AMYLASE in the last 168 hours. Recent Labs  Lab 07/30/2019 1551  AMMONIA 22   Coagulation Profile: Recent Labs  Lab 07/25/2019 1551  INR 1.2   Cardiac Enzymes: No results for input(s): CKTOTAL, CKMB, CKMBINDEX, TROPONINI in the last 168 hours. BNP (last 3 results) No results for input(s): PROBNP in the last 8760 hours. HbA1C: No results for input(s): HGBA1C in the last 72 hours. CBG: Recent Labs  Lab  07/11/2019 1547  GLUCAP 141*   Lipid Profile: No results for input(s): CHOL, HDL, LDLCALC, TRIG, CHOLHDL, LDLDIRECT in the last 72 hours. Thyroid Function Tests: No results for input(s): TSH, T4TOTAL, FREET4, T3FREE, THYROIDAB in the last 72 hours. Anemia Panel: No results for input(s): VITAMINB12, FOLATE, FERRITIN, TIBC, IRON, RETICCTPCT in the last 72 hours. Sepsis Labs: Recent Labs  Lab 07/09/2019 1904  LATICACIDVEN 1.1    Recent Results (from the past 240 hour(s))  SARS Coronavirus 2 by RT PCR (hospital order, performed in [facility] hospital lab) Nasopharyngeal Nasopharyngeal Swab     Status: None   Collection Time: 07/11/2019  5:34 PM   Specimen: Nasopharyngeal Swab  Result Value Ref  Range Status   SARS Coronavirus 2 NEGATIVE NEGATIVE Final    Comment: (NOTE) SARS-CoV-2 target nucleic acids are NOT DETECTED.  The SARS-CoV-2 RNA is generally detectable in upper and lower respiratory specimens during the acute phase of infection. The lowest concentration of SARS-CoV-2 viral copies this assay can detect is 250 copies / mL. A negative result does not preclude SARS-CoV-2 infection and should not be used as the sole basis for treatment or other patient management decisions.  A negative result may occur with improper specimen collection / handling, submission of specimen other than nasopharyngeal swab, presence of viral mutation(s) within the areas targeted by this assay, and inadequate number of viral copies (<250 copies / mL). A negative result must be combined with clinical observations, patient history, and epidemiological information.  Fact Sheet for Patients:   StrictlyIdeas.no  Fact Sheet for Healthcare Providers: BankingDealers.co.za  This test is not yet approved or  cleared by the [place] FDA and has been authorized for detection and/or diagnosis of SARS-CoV-2 by FDA under an Emergency Use Authorization (EUA).  This  EUA will remain in effect (meaning this test can be used) for the duration of the COVID-19 declaration under Section 564(b)(1) of the Act, 21 U.S.C. section 360bbb-3(b)(1), unless the authorization is terminated or revoked sooner.  Performed at [facility], [address], [place], [place] [address]   Culture, blood (routine x 2)     Status: None (Preliminary result)   Collection Time: 07/14/2019  7:04 PM   Specimen: BLOOD RIGHT FOREARM  Result Value Ref Range Status   Specimen Description BLOOD RIGHT FOREARM  Final   Special Requests   Final    BOTTLES DRAWN AEROBIC AND ANAEROBIC Blood Culture adequate volume   Culture   Final    NO GROWTH < 12 HOURS Performed at [facility], [address], [place], [place] [address]    Report Status PENDING  Incomplete  Culture, blood (routine x 2)     Status: None (Preliminary result)   Collection Time: 07/16/2019  7:15 PM   Specimen: BLOOD LEFT HAND  Result Value Ref Range Status   Specimen Description BLOOD LEFT HAND  Final   Special Requests   Final    BOTTLES DRAWN AEROBIC AND ANAEROBIC Blood Culture adequate volume   Culture   Final    NO GROWTH < 12 HOURS Performed at [facility], [address] [place], [place] [address]    Report Status PENDING  Incomplete         Radiology Studies: DG Chest 2 View  Result Date: 07/16/2019 CLINICAL DATA:  Altered level of consciousness. EXAM: CHEST - 2 VIEW COMPARISON:  January 07, 2018 FINDINGS: There is a stable left subclavian Port-A-Cath in place. There is no pneumothorax or large pleural effusion. The lung volumes are slightly low. There is no acute osseous abnormality. IMPRESSION: No active cardiopulmonary disease. Electronically Signed   By: Constance Holster M.D.   On: 07/27/2019 15:59   CT Head Wo Contrast  Result Date: 07/21/2019 CLINICAL DATA:  Altered mental status. EXAM: CT HEAD WITHOUT CONTRAST TECHNIQUE: Contiguous axial images were obtained from the base of the skull through the  vertex without intravenous contrast. COMPARISON:  January 25, 2019. FINDINGS: Brain: Mild chronic ischemic white matter disease is noted. Stable right temporal and right frontal encephalomalacia is noted consistent with old infarction. No mass effect or midline shift is noted. Ventricular size is within normal limits. There is no evidence of mass lesion, hemorrhage or  acute infarction. Vascular: No hyperdense vessel or unexpected calcification. Skull: Normal. Negative for fracture or focal lesion. Sinuses/Orbits: Chronic mucosal thickening of right maxillary sinus. Other: None. IMPRESSION: Mild chronic ischemic white matter disease. Stable right temporal and right frontal encephalomalacia consistent with old infarction. No acute intracranial abnormality seen. Electronically Signed   By: Marijo Conception M.D.   On: 08/04/2019 15:42        Scheduled Meds:  amitriptyline  25 mg Oral QHS   buPROPion  150 mg Oral q morning - 10a   gabapentin  300 mg Oral TID   loperamide  4 mg Oral BID   pantoprazole  40 mg Oral Daily   rivaroxaban  20 mg Oral Q supper   tamsulosin  0.4 mg Oral Daily   Continuous Infusions:  sodium chloride     cefTRIAXone (ROCEPHIN)  IV       LOS: 1 day    Time spent: 35 minutes    Labresha Mellor Darleen Crocker, DO [facility]  If 7PM-7AM, please contact night-coverage [email or web address] 08/07/2019, 1:55 PM

## 2019-08-07 NOTE — NC FL2 (Signed)
[place] LEVEL OF CARE SCREENING TOOL     IDENTIFICATION  Patient Name: Erik [name] Birthdate: February 10, 1955 Sex: male Admission Date (Current Location): 07/21/2019  [facility] and [place] Number:  [employer] and Address:  [facility] [address] [place]      Provider Number: [id number]  Attending Physician Name and Address:  Rodena Goldmann, DO  Relative Name and Phone Number:  Madelaine Etienne [phone] ( friend)    Current Level of Care: Hospital Recommended Level of Care: [place] Prior Approval Number:    Date Approved/Denied:   PASRR Number: Pending  Discharge Plan: SNF    Current Diagnoses: Patient Active Problem List   Diagnosis Date Noted   Acute metabolic encephalopathy 86/76/1950   Colon cancer metastasized to multiple sites [facility]) 01/16/2018   Malignant neoplasm of overlapping sites of colon ([place]) 12/26/2017   S/P exploratory laparotomy 12/19/2017   Carcinomatosis ([place])    Psoriasis 09/12/2016   Pulmonary nodule 06/14/2016   History of colon cancer    Diarrhea 01/06/2016   Cigarette nicotine dependence with nicotine-induced disorder 06/18/2015   Chronic obstructive pulmonary disease ([place]) 06/18/2015   Depression 06/18/2015   B12 deficiency 04/14/2015   Malignant neoplasm of transverse colon ([place]) 02/10/2015   GERD (gastroesophageal reflux disease) 12/15/2014   History of colonic polyps 12/15/2014   History of alcohol abuse 09/16/2014   Panic disorder 10/10/2012   Major depressive disorder, single episode 10/10/2012   Bilateral lower extremity edema 04/24/2012   Hyperlipidemia 04/21/2009   CAD, NATIVE VESSEL 03/25/2009   HEAD TRAUMA 03/02/2009    Orientation RESPIRATION BLADDER Height & Weight     Self, Place  Normal Continent Weight: 88.7 kg Height:  6\' 2"  (188 cm)  BEHAVIORAL SYMPTOMS/MOOD NEUROLOGICAL BOWEL NUTRITION STATUS      Continent Diet (reg  diet)  AMBULATORY STATUS COMMUNICATION OF NEEDS Skin   Extensive Assist Verbally Bruising, Other (Comment) (arms and legs)                       Personal Care Assistance Level of Assistance  Bathing, Feeding, Dressing Bathing Assistance: Limited assistance Feeding assistance: Limited assistance Dressing Assistance: Limited assistance     Functional Limitations Info  Sight, Speech, Hearing Sight Info: Adequate Hearing Info: Adequate Speech Info: Adequate    SPECIAL CARE FACTORS FREQUENCY  PT (By licensed PT)     PT Frequency: 5 times a week              Contractures Contractures Info: Not present    Additional Factors Info  Code Status, Allergies Code Status Info: FULL Allergies Info: NKDA           Current Medications (08/07/2019):  This is the current hospital active medication list Current Facility-Administered Medications  Medication Dose Route Frequency Provider Last Rate Last Admin   acetaminophen (TYLENOL) tablet 650 mg  650 mg Oral Q6H PRN Emokpae, Ejiroghene E, MD       Or   acetaminophen (TYLENOL) suppository 650 mg  650 mg Rectal Q6H PRN Emokpae, Ejiroghene E, MD       cefTRIAXone (ROCEPHIN) 1 g in sodium chloride 0.9 % 100 mL IVPB  1 g Intravenous Q24H Emokpae, Ejiroghene E, MD       ondansetron (ZOFRAN) tablet 4 mg  4 mg Oral Q6H PRN Emokpae, Ejiroghene E, MD       Or   ondansetron (ZOFRAN) injection 4 mg  4 mg Intravenous Q6H PRN Emokpae, Ejiroghene E, MD   4 mg at 08/05/2019 2349   polyethylene glycol (MIRALAX / GLYCOLAX) packet 17 g  17 g Oral Daily PRN Emokpae, Ejiroghene E, MD       Facility-Administered Medications Ordered in Other Encounters  Medication Dose Route Frequency Provider Last Rate Last Admin   sodium chloride flush (NS) 0.9 % injection 10 mL  10 mL Intracatheter PRN Derek Jack, MD   10 mL at 11/07/18 1322   sodium chloride flush (NS) 0.9 % injection 20 mL  20 mL Intravenous PRN Derek Jack, MD   20 mL at  07/23/19 1149     Discharge Medications: Please see discharge summary for a list of discharge medications.  Relevant Imaging Results:  Relevant Lab Results:   Additional Information SS# [id number]  Boneta Lucks, RN

## 2019-08-07 NOTE — TOC Initial Note (Signed)
Transition of Care [facility]) - Initial/Assessment Note    Patient Details  Name: Erik [name] MRN: [id number] Date of Birth: Aug 27, 1955  Transition of Care [facility]) CM/SW Contact:    Natasha Bence, LCSW Phone Number: 08/07/2019, 3:16 PM  Clinical Narrative:      To Whom It May Concern:  Please be advised that the above name patient will require a short-term nursing home stay- anticipated 30 days or less rehabilitation and strengthening. The plan is for return home.                      Patient Goals and CMS Choice        Expected Discharge Plan and Services                                                Prior Living Arrangements/Services                       Activities of Daily Living Home Assistive Devices/Equipment: Cane (specify quad or straight), Eyeglasses, Walker (specify type) ADL Screening (condition at time of admission) Patient's cognitive ability adequate to safely complete daily activities?: Yes Is the patient deaf or have difficulty hearing?: No Does the patient have difficulty seeing, even when wearing glasses/contacts?: No Does the patient have difficulty concentrating, remembering, or making decisions?: No Patient able to express need for assistance with ADLs?: Yes Does the patient have difficulty dressing or bathing?: No Independently performs ADLs?: Yes (appropriate for developmental age) Does the patient have difficulty walking or climbing stairs?: Yes Weakness of Legs: Both Weakness of Arms/Hands: None  Permission Sought/Granted                  Emotional Assessment              Admission diagnosis:  Hyponatremia [E87.1] Acute cystitis without hematuria [N30.00] Colon cancer metastasized to multiple sites [facility]) [Z16.9] Acute metabolic encephalopathy [C78.93] Patient Active Problem List   Diagnosis Date Noted   Acute metabolic encephalopathy 81/02/7508   Colon cancer metastasized to multiple sites ([place])  01/16/2018   Malignant neoplasm of overlapping sites of colon ([place]) 12/26/2017   S/P exploratory laparotomy 12/19/2017   Carcinomatosis ([place])    Psoriasis 09/12/2016   Pulmonary nodule 06/14/2016   History of colon cancer    Diarrhea 01/06/2016   Cigarette nicotine dependence with nicotine-induced disorder 06/18/2015   Chronic obstructive pulmonary disease ([place]) 06/18/2015   Depression 06/18/2015   B12 deficiency 04/14/2015   Malignant neoplasm of transverse colon ([place]) 02/10/2015   GERD (gastroesophageal reflux disease) 12/15/2014   History of colonic polyps 12/15/2014   History of alcohol abuse 09/16/2014   Panic disorder 10/10/2012   Major depressive disorder, single episode 10/10/2012   Bilateral lower extremity edema 04/24/2012   Hyperlipidemia 04/21/2009   CAD, NATIVE VESSEL 03/25/2009   HEAD TRAUMA 03/02/2009   PCP:  Alanson Puls [facility] Pharmacy:   Levin Erp, [place] [address] [place] [address] [place] [address] Phone: [phone] Fax: [place], [place] [address] Phone: [phone] Fax: [phone]     Social Determinants of Health (SDOH) Interventions    Readmission Risk Interventions No flowsheet data found.

## 2019-08-07 NOTE — TOC Initial Note (Signed)
Transition of Care [facility]) - Initial/Assessment Note    Patient Details  Name: Erik [name] MRN: [id number] Date of Birth: 09-01-1955  Transition of Care [facility]) CM/SW Contact:    Boneta Lucks, RN Phone Number: 08/07/2019, 3:27 PM  Clinical Narrative:           Patient admitted for acute metabolic encephalopathy. PT is recommending SNF, patient is agreeable. Patient is accepting bed [facility] at [facility]. Patient gave permission to call Inez Catalina on his contact list. Left Inez Catalina a message.         Expected Discharge Plan: Skilled Nursing Facility Barriers to Discharge: Continued Medical Work up   Patient Goals and CMS Choice Patient states their goals for this hospitalization and ongoing recovery are:: to go home. CMS Medicare.gov Compare Post Acute Care list provided to:: Patient Choice offered to / list presented to : Patient  Expected Discharge Plan and Services Expected Discharge Plan: [place]      Living arrangements for the past 2 months: Apartment                    Prior Living Arrangements/Services Living arrangements for the past 2 months: Apartment Lives with:: Self          Need for Family Participation in Patient Care: Yes (Comment) Care giver support system in place?: No (comment)   Criminal Activity/Legal Involvement Pertinent to Current Situation/Hospitalization: No - Comment as needed  Activities of Daily Living Home Assistive Devices/Equipment: Cane (specify quad or straight), Eyeglasses, Walker (specify type) ADL Screening (condition at time of admission) Patient's cognitive ability adequate to safely complete daily activities?: Yes Is the patient deaf or have difficulty hearing?: No Does the patient have difficulty seeing, even when wearing glasses/contacts?: No Does the patient have difficulty concentrating, remembering, or making decisions?: No Patient able to express need for assistance with ADLs?: Yes Does the patient have  difficulty dressing or bathing?: No Independently performs ADLs?: Yes (appropriate for developmental age) Does the patient have difficulty walking or climbing stairs?: Yes Weakness of Legs: Both Weakness of Arms/Hands: None  Permission Sought/Granted     Emotional Assessment     Affect (typically observed): Accepting Orientation: : Oriented to Self Alcohol / Substance Use: Not Applicable Psych Involvement: No (comment)  Admission diagnosis:  Hyponatremia [E87.1] Acute cystitis without hematuria [N30.00] Colon cancer metastasized to multiple sites [facility]) [W29.5] Acute metabolic encephalopathy [A21.30] Patient Active Problem List   Diagnosis Date Noted  . Acute metabolic encephalopathy 86/57/8469  . Colon cancer metastasized to multiple sites ([place]) 01/16/2018  . Malignant neoplasm of overlapping sites of colon ([place]) 12/26/2017  . S/P exploratory laparotomy 12/19/2017  . Carcinomatosis ([place])   . Psoriasis 09/12/2016  . Pulmonary nodule 06/14/2016  . History of colon cancer   . Diarrhea 01/06/2016  . Cigarette nicotine dependence with nicotine-induced disorder 06/18/2015  . Chronic obstructive pulmonary disease ([place]) 06/18/2015  . Depression 06/18/2015  . B12 deficiency 04/14/2015  . Malignant neoplasm of transverse colon ([place]) 02/10/2015  . GERD (gastroesophageal reflux disease) 12/15/2014  . History of colonic polyps 12/15/2014  . History of alcohol abuse 09/16/2014  . Panic disorder 10/10/2012  . Major depressive disorder, single episode 10/10/2012  . Bilateral lower extremity edema 04/24/2012  . Hyperlipidemia 04/21/2009  . CAD, NATIVE VESSEL 03/25/2009  . HEAD TRAUMA 03/02/2009   PCP:  Alanson Puls [facility] Pharmacy:   [facility]p, [place] [address]  [place] [address] Phone: [phone] Fax: [place], [place] [address] Phone:  [phone] Fax: [phone]   Readmission Risk Interventions No flowsheet data found.

## 2019-08-07 NOTE — Plan of Care (Signed)

## 2019-08-07 NOTE — Plan of Care (Signed)
  Problem: Acute Rehab PT Goals(only PT should resolve) Goal: Pt Will Go Supine/Side To Sit Outcome: Progressing Flowsheets (Taken 08/07/2019 1133) Pt will go Supine/Side to Sit: with modified independence Goal: Patient Will Transfer Sit To/From Stand Outcome: Progressing Flowsheets (Taken 08/07/2019 1133) Patient will transfer sit to/from stand: with min guard assist Goal: Pt Will Transfer Bed To Chair/Chair To Bed Outcome: Progressing Flowsheets (Taken 08/07/2019 1133) Pt will Transfer Bed to Chair/Chair to Bed:  min guard assist  with min assist Goal: Pt Will Ambulate Outcome: Progressing Flowsheets (Taken 08/07/2019 1133) Pt will Ambulate:  50 feet  with min guard assist  with minimal assist  with rolling walker   11:34 AM, 08/07/19 Lonell Grandchild, MPT Physical Therapist with [facility] [phone] office [phone] mobile phone

## 2019-08-07 DEATH — deceased

## 2019-08-08 ENCOUNTER — Encounter (HOSPITAL_COMMUNITY): Payer: Self-pay | Admitting: *Deleted

## 2019-08-08 ENCOUNTER — Inpatient Hospital Stay (HOSPITAL_COMMUNITY): Payer: Medicaid Other

## 2019-08-08 LAB — BASIC METABOLIC PANEL
Anion gap: 12 (ref 5–15)
BUN: 8 mg/dL (ref 8–23)
CO2: 27 mmol/L (ref 22–32)
Calcium: 9 mg/dL (ref 8.9–10.3)
Chloride: 95 mmol/L — ABNORMAL LOW (ref 98–111)
Creatinine, Ser: 0.75 mg/dL (ref 0.61–1.24)
GFR calc Af Amer: >60 mL/min (ref >60)
GFR calc non Af Amer: >60 mL/min (ref >60)
Glucose, Bld: 126 mg/dL — ABNORMAL HIGH (ref 70–99)
Potassium: 3.2 mmol/L — ABNORMAL LOW (ref 3.5–5.1)
Sodium: 134 mmol/L — ABNORMAL LOW (ref 135–145)

## 2019-08-08 LAB — CBC
HCT: 38 % — ABNORMAL LOW (ref 39.0–52.0)
Hemoglobin: 12.1 g/dL — ABNORMAL LOW (ref 13.0–17.0)
MCH: 31.3 pg (ref 26.0–34.0)
MCHC: 31.8 g/dL (ref 30.0–36.0)
MCV: 98.2 fL (ref 80.0–100.0)
Platelets: 133 10*3/uL — ABNORMAL LOW (ref 150–400)
RBC: 3.87 MIL/uL — ABNORMAL LOW (ref 4.22–5.81)
RDW: 16 % — ABNORMAL HIGH (ref 11.5–15.5)
WBC: 2.9 10*3/uL — ABNORMAL LOW (ref 4.0–10.5)
nRBC: 0 % (ref 0.0–0.2)

## 2019-08-08 MED ORDER — POTASSIUM CHLORIDE CRYS ER 20 MEQ PO TBCR
40.0000 meq | EXTENDED_RELEASE_TABLET | Freq: Once | ORAL | Status: AC
  Administered 2019-08-08: 40 meq via ORAL
  Filled 2019-08-08: qty 2

## 2019-08-08 MED ORDER — ALPRAZOLAM 0.25 MG PO TABS
0.2500 mg | ORAL_TABLET | Freq: Once | ORAL | Status: AC
  Administered 2019-08-08: 0.25 mg via ORAL
  Filled 2019-08-08: qty 1

## 2019-08-08 MED ORDER — POTASSIUM CHLORIDE ER 10 MEQ PO TBCR: 10.0000 meq | EXTENDED_RELEASE_TABLET | Freq: Every day | ORAL | 0 refills | Status: AC

## 2019-08-08 MED ORDER — CEFDINIR 300 MG PO CAPS
300.0000 mg | ORAL_CAPSULE | Freq: Two times a day (BID) | ORAL | 0 refills | Status: AC
Start: 2019-08-08 — End: 2019-08-13

## 2019-08-08 NOTE — Discharge Summary (Signed)
Physician Discharge Summary  Erik [name] QHU:[id number] DOB: 19-May-1955 DOA: 07/21/2019  PCP: Alanson Puls [facility] date: 07/16/2019  Discharge date: 08/08/2019  Admitted From:Home  Disposition:  SNF  Recommendations for Outpatient Follow-up:  1. Follow up with PCP in 1-2 weeks 2. Please obtain BMP/CBC in one week 3. Continue on Omnicef as prescribed for 5 more days to complete total 7-day course of treatment for Proteus UTI 4. Continue potassium as prescribed for 7 days  Home Health: None  Equipment/Devices: None  Discharge Condition: Stable  CODE STATUS: Full  Diet recommendation: Heart Healthy  Brief/Interim Summary: Per HPI: Erik [name] a 64 y.o.malewith medical history significant formetastatic lung cancer, COPD, alcohol abuse, psoriasis, coronary artery disease. Brought to the ED via EMS,retractions, patient's neighbor went to check on him and found him to be confused. Neighbors also reported that patient has no family, and is unable to take care of himself. At the time of my evaluation, patient is awakeandalert,answers a few questions appropriately, but otherwise he is unable to remember or is confused. He reports he has been having pain with urination over the past 4 weeks. He reports intermittent vomiting, intermittent loose stools, he has chronic abdominal pain.  He reports that he has been falling, he is unable to tell me what he has been eating.  Apparently in the ED there was dried stool on patient's legs.  ED Course:Temperature 97.6, heart rate 90s 200s, blood pressure systolic [id number]. O2 sats greater than 95% on room air. WBC 3.5. Sodium 128. Ammonia level 22. INR 1.2. UA shows positive nitrite and large leukocytes with few bacteria. Head CT shows old infarct without acute abnormality. Two-view chest x-ray without acute abnormality. Covid test negative. IV ceftriaxone started, 1 L bolus given. Hospitalist admit for  altered mental status, UTI.  Acute metabolic encephalopathy secondary to Proteus UTI-resolved -Blood cultures negative -Discharged on Omnicef for 5 more days to complete 7-day course of treatment  Hyponatremia-improved -Likely secondary to multiple factors of poor p.o. intake as well as intermittent nausea, vomiting, and diarrhea -Follow-up BMP outpatient  Mild hypokalemia -Likely related to chronic diarrhea -Continue supplementation as prescribed and follow-up BMP in outpatient setting  Chronic diarrhea -Continue home Imodium -No further need for evaluation  Falls/weakness -Appreciate PT evaluation with noted need for SNF/rehab  Colon cancer with metastasis -Follows with Dr. Delton Coombes and is currently on chemotherapy  History of pulmonary embolism -Resume home Xarelto  COPD-stable -No acute bronchospasms noted  History of CAD   Discharge Diagnoses:  Active Problems:   Acute metabolic encephalopathy  Principal discharge diagnosis: Acute metabolic encephalopathy secondary to Proteus UTI.  Discharge Instructions  Discharge Instructions    Diet - low sodium heart healthy   Complete by: As directed    Increase activity slowly   Complete by: As directed      Allergies as of 08/08/2019   No Known Allergies     Medication List    STOP taking these medications   busPIRone 10 MG tablet Commonly known as: BUSPAR   HYDROcodone-acetaminophen 5-325 MG tablet Commonly known as: Norco     TAKE these medications   amitriptyline 25 MG tablet Commonly known as: ELAVIL Take 1 tablet (25 mg total) by mouth at bedtime.   atorvastatin 40 MG tablet Commonly known as: LIPITOR Take 40 mg by mouth daily.   buPROPion 150 MG 12 hr tablet Commonly known as: WELLBUTRIN SR Take 150 mg by mouth every morning.   cefdinir 300 MG  capsule Commonly known as: OMNICEF Take 1 capsule (300 mg total) by mouth 2 (two) times daily for 5 days.   fluorouracil CALGB 25053 in  sodium chloride 0.9 % 150 mL Inject 5,650 mg into the vein. Over 46 hours   gabapentin 300 MG capsule Commonly known as: NEURONTIN TAKE 1 CAPSULE BY MOUTH THREE TIMES A DAY   IRINOTECAN HCL IV Inject 420 mg into the vein every 14 (fourteen) days.   LEUCOVORIN CALCIUM IV Inject 944 mg into the vein every 14 (fourteen) days.   lidocaine-prilocaine cream Commonly known as: EMLA APPLY A SMALL AMOUNT OVER PORT SITE AND COVER WITH PLASTIC WRAP ONE HOUR PRIOR TO APPOINTMENT   loperamide 2 MG capsule Commonly known as: IMODIUM TAKE 2 CAPSULES BY MOUTH TWICE DAILY   nystatin powder Commonly known as: MYCOSTATIN/NYSTOP Apply 1 application topically 4 (four) times daily.   omeprazole 20 MG capsule Commonly known as: PRILOSEC Take 20 mg by mouth every morning.   ondansetron 8 MG tablet Commonly known as: ZOFRAN Take 1 tablet (8 mg total) by mouth 2 (two) times daily as needed for refractory nausea / vomiting. Start on day 3 after chemotherapy.   potassium chloride 10 MEQ tablet Commonly known as: KLOR-CON Take 1 tablet (10 mEq total) by mouth daily for 7 days.   ProAir HFA 108 (90 Base) MCG/ACT inhaler Generic drug: albuterol INHALE 2 PUFFS BY MOUTH EVERY 6 HOURS AS NEEDED FOR SHORTNESS OF BREATH/WHEEZING.   prochlorperazine 10 MG tablet Commonly known as: COMPAZINE Take 1 tablet (10 mg total) by mouth every 6 (six) hours as needed (NAUSEA).   tamsulosin 0.4 MG Caps capsule Commonly known as: FLOMAX TAKE 1 CAPSULE (0.4 MG TOTAL) BY MOUTH DAILY.   Vitamin D (Ergocalciferol) 1.25 MG (50000 UNIT) Caps capsule Commonly known as: DRISDOL Take 50,000 Units by mouth once a week.   Xarelto 20 MG Tabs tablet Generic drug: rivaroxaban TAKE 1 TABLET BY MOUTH EVERY DAY WITH SUPPER What changed: See the new instructions.       Contact information for follow-up providers    [facility] Follow up in 1 week(s).   Contact information: [place]  [phone]            Contact information for after-discharge care    [facility] .   Service: Skilled Nursing Contact information: [address] [place] [phone]                 No Known Allergies  Consultations:  None   Procedures/Studies: DG Chest 2 View  Result Date: 07/10/2019 CLINICAL DATA:  Altered level of consciousness. EXAM: CHEST - 2 VIEW COMPARISON:  January 07, 2018 FINDINGS: There is a stable left subclavian Port-A-Cath in place. There is no pneumothorax or large pleural effusion. The lung volumes are slightly low. There is no acute osseous abnormality. IMPRESSION: No active cardiopulmonary disease. Electronically Signed   By: Constance Holster M.D.   On: 07/10/2019 15:59   CT Head Wo Contrast  Result Date: 07/10/2019 CLINICAL DATA:  Altered mental status. EXAM: CT HEAD WITHOUT CONTRAST TECHNIQUE: Contiguous axial images were obtained from the base of the skull through the vertex without intravenous contrast. COMPARISON:  January 25, 2019. FINDINGS: Brain: Mild chronic ischemic white matter disease is noted. Stable right temporal and right frontal encephalomalacia is noted consistent with old infarction. No mass effect or midline shift is noted. Ventricular  size is within normal limits. There is no evidence of mass lesion, hemorrhage or acute infarction. Vascular: No hyperdense vessel or unexpected calcification. Skull: Normal. Negative for fracture or focal lesion. Sinuses/Orbits: Chronic mucosal thickening of right maxillary sinus. Other: None. IMPRESSION: Mild chronic ischemic white matter disease. Stable right temporal and right frontal encephalomalacia consistent with old infarction. No acute intracranial abnormality seen. Electronically Signed   By: Marijo Conception M.D.   On: 07/30/2019 15:42     Discharge Exam: Vitals:   08/07/19 2156 08/08/19 0440  BP: (!) 145/89 128/81   Pulse: (!) 102 (!) 105  Resp: 20 16  Temp: 98.7 F (37.1 C) (!) 97.5 F (36.4 C)  SpO2: 97% 94%   Vitals:   08/07/19 0528 08/07/19 1400 08/07/19 2156 08/08/19 0440  BP: 130/77 128/79 (!) 145/89 128/81  Pulse: 99 100 (!) 102 (!) 105  Resp: 20  20 16   Temp: 98.2 F (36.8 C) 98 F (36.7 C) 98.7 F (37.1 C) (!) 97.5 F (36.4 C)  TempSrc: Oral Oral Oral Oral  SpO2: 98% 99% 97% 94%  Weight:      Height:        General: Pt is alert, awake, not in acute distress Cardiovascular: RRR, S1/S2 +, no rubs, no gallops Respiratory: CTA bilaterally, no wheezing, no rhonchi Abdominal: Soft, NT, ND, bowel sounds + Extremities: no edema, no cyanosis    The results of significant diagnostics from this hospitalization (including imaging, microbiology, ancillary and laboratory) are listed below for reference.     Microbiology: Recent Results (from the past 240 hour(s))  SARS Coronavirus 2 by RT PCR (hospital order, performed in [facility] hospital lab) Nasopharyngeal Nasopharyngeal Swab     Status: None   Collection Time: 07/26/2019  5:34 PM   Specimen: Nasopharyngeal Swab  Result Value Ref Range Status   SARS Coronavirus 2 NEGATIVE NEGATIVE Final    Comment: (NOTE) SARS-CoV-2 target nucleic acids are NOT DETECTED.  The SARS-CoV-2 RNA is generally detectable in upper and lower respiratory specimens during the acute phase of infection. The lowest concentration of SARS-CoV-2 viral copies this assay can detect is 250 copies / mL. A negative result does not preclude SARS-CoV-2 infection and should not be used as the sole basis for treatment or other patient management decisions.  A negative result may occur with improper specimen collection / handling, submission of specimen other than nasopharyngeal swab, presence of viral mutation(s) within the areas targeted by this assay, and inadequate number of viral copies (<250 copies / mL). A negative result must be combined with  clinical observations, patient history, and epidemiological information.  Fact Sheet for Patients:   StrictlyIdeas.no  Fact Sheet for Healthcare Providers: BankingDealers.co.za  This test is not yet approved or  cleared by the [place] FDA and has been authorized for detection and/or diagnosis of SARS-CoV-2 by FDA under an Emergency Use Authorization (EUA).  This EUA will remain in effect (meaning this test can be used) for the duration of the COVID-19 declaration under Section 564(b)(1) of the Act, 21 U.S.C. section 360bbb-3(b)(1), unless the authorization is terminated or revoked sooner.  Performed at [facility], [address] [place], [place] [address]   Urine culture     Status: Abnormal (Preliminary result)   Collection Time: 07/22/2019  5:49 PM   Specimen: Urine, Random  Result Value Ref Range Status   Specimen Description   Final    URINE, RANDOM Performed at [facility], [address] [place],  [place] [address]    Special Requests   Final    NONE Performed at [facility], [address] [name], [place] [address]    Culture >=100,000 COLONIES/mL PROTEUS MIRABILIS (A)  Final   Report Status PENDING  Incomplete  Culture, blood (routine x 2)     Status: None (Preliminary result)   Collection Time: 08/01/2019  7:04 PM   Specimen: BLOOD RIGHT FOREARM  Result Value Ref Range Status   Specimen Description BLOOD RIGHT FOREARM  Final   Special Requests   Final    BOTTLES DRAWN AEROBIC AND ANAEROBIC Blood Culture adequate volume   Culture   Final    NO GROWTH 2 DAYS Performed at [facility], [address], [place], [place] [address]    Report Status PENDING  Incomplete  Culture, blood (routine x 2)     Status: None (Preliminary result)   Collection Time: 07/31/2019  7:15 PM   Specimen: BLOOD LEFT HAND  Result Value Ref Range Status   Specimen Description BLOOD LEFT HAND  Final   Special Requests   Final    BOTTLES  DRAWN AEROBIC AND ANAEROBIC Blood Culture adequate volume   Culture   Final    NO GROWTH 2 DAYS Performed at [facility], [address], [place],  [address]    Report Status PENDING  Incomplete     Labs: BNP (last 3 results) No results for input(s): BNP in the last 8760 hours. Basic Metabolic Panel: Recent Labs  Lab 07/08/2019 1551 08/07/19 0706 08/08/19 0904  NA 128* 132* 134*  K 3.7 4.1 3.2*  CL 91* 94* 95*  CO2 24 27 27   GLUCOSE 144* 115* 126*  BUN 10 10 8   CREATININE 0.78 0.77 0.75  CALCIUM 8.6* 8.6* 9.0   Liver Function Tests: Recent Labs  Lab 08/03/2019 1551  AST 13*  ALT 11  ALKPHOS 99  BILITOT 0.9  PROT 6.5  ALBUMIN 3.0*   No results for input(s): LIPASE, AMYLASE in the last 168 hours. Recent Labs  Lab 07/20/2019 1551  AMMONIA 22   CBC: Recent Labs  Lab 07/18/2019 1551 08/08/19 0904  WBC 3.5* 2.9*  NEUTROABS 2.4  --   HGB 12.4* 12.1*  HCT 36.7* 38.0*  MCV 93.4 98.2  PLT 150 133*   Cardiac Enzymes: No results for input(s): CKTOTAL, CKMB, CKMBINDEX, TROPONINI in the last 168 hours. BNP: Invalid input(s): POCBNP CBG: Recent Labs  Lab 07/21/2019 1547  GLUCAP 141*   D-Dimer No results for input(s): DDIMER in the last 72 hours. Hgb A1c No results for input(s): HGBA1C in the last 72 hours. Lipid Profile No results for input(s): CHOL, HDL, LDLCALC, TRIG, CHOLHDL, LDLDIRECT in the last 72 hours. Thyroid function studies No results for input(s): TSH, T4TOTAL, T3FREE, THYROIDAB in the last 72 hours.  Invalid input(s): FREET3 Anemia work up No results for input(s): VITAMINB12, FOLATE, FERRITIN, TIBC, IRON, RETICCTPCT in the last 72 hours. Urinalysis    Component Value Date/Time   COLORURINE YELLOW 07/22/2019 1749   APPEARANCEUR CLOUDY (A) 07/22/2019 1749   LABSPEC 1.015 07/23/2019 1749   PHURINE 9.0 (H) 07/28/2019 1749   GLUCOSEU NEGATIVE 08/03/2019 1749   HGBUR NEGATIVE 08/05/2019 1749   BILIRUBINUR SMALL (A) 07/24/2019 1749    KETONESUR 5 (A) 07/10/2019 1749   PROTEINUR 100 (A) 07/30/2019 1749   UROBILINOGEN 0.2 09/28/2009 1118   NITRITE POSITIVE (A) 07/18/2019 1749   LEUKOCYTESUR LARGE (A) 08/03/2019 1749   Sepsis Labs Invalid input(s): PROCALCITONIN,  WBC,  [facility] Microbiology Recent Results (from the past 240 hour(s))  SARS Coronavirus 2 by RT PCR (hospital order, performed in [facility] hospital lab) Nasopharyngeal Nasopharyngeal Swab     Status: None   Collection Time: 07/29/2019  5:34 PM   Specimen: Nasopharyngeal Swab  Result Value Ref Range Status   SARS Coronavirus 2 NEGATIVE NEGATIVE Final    Comment: (NOTE) SARS-CoV-2 target nucleic acids are NOT DETECTED.  The SARS-CoV-2 RNA is generally detectable in upper and lower respiratory specimens during the acute phase of infection. The lowest concentration of SARS-CoV-2 viral copies this assay can detect is 250 copies / mL. A negative result does not preclude SARS-CoV-2 infection and should not be used as the sole basis for treatment or other patient management decisions.  A negative result may occur with improper specimen collection / handling, submission of specimen other than nasopharyngeal swab, presence of viral mutation(s) within the areas targeted by this assay, and inadequate number of viral copies (<250 copies / mL). A negative result must be combined with clinical observations, patient history, and epidemiological information.  Fact Sheet for Patients:   StrictlyIdeas.no  Fact Sheet for Healthcare Providers: BankingDealers.co.za  This test is not yet approved or  cleared by the [place] FDA and has been authorized for detection and/or diagnosis of SARS-CoV-2 by FDA under an Emergency Use Authorization (EUA).  This EUA will remain in effect (meaning this test can be used) for the duration of the COVID-19 declaration under Section 564(b)(1) of the Act, 21 U.S.C. section  360bbb-3(b)(1), unless the authorization is terminated or revoked sooner.  Performed at [facility], [address] [place], [place] [address]   Urine culture     Status: Abnormal (Preliminary result)   Collection Time: 07/23/2019  5:49 PM   Specimen: Urine, Random  Result Value Ref Range Status   Specimen Description   Final    URINE, RANDOM Performed at [facility], [address] [place], [place] [address]    Special Requests   Final    NONE Performed at [facility], [address] [place], [place] [address]    Culture >=100,000 COLONIES/mL PROTEUS MIRABILIS (A)  Final   Report Status PENDING  Incomplete  Culture, blood (routine x 2)     Status: None (Preliminary result)   Collection Time: 07/24/2019  7:04 PM   Specimen: BLOOD RIGHT FOREARM  Result Value Ref Range Status   Specimen Description BLOOD RIGHT FOREARM  Final   Special Requests   Final    BOTTLES DRAWN AEROBIC AND ANAEROBIC Blood Culture adequate volume   Culture   Final    NO GROWTH 2 DAYS Performed at [facility], [address], [place], [place] [address]    Report Status PENDING  Incomplete  Culture, blood (routine x 2)     Status: None (Preliminary result)   Collection Time: 07/12/2019  7:15 PM   Specimen: BLOOD LEFT HAND  Result Value Ref Range Status   Specimen Description BLOOD LEFT HAND  Final   Special Requests   Final    BOTTLES DRAWN AEROBIC AND ANAEROBIC Blood Culture adequate volume   Culture   Final    NO GROWTH 2 DAYS Performed at [facility], [address], [place],  [address]    Report Status PENDING  Incomplete     Time coordinating discharge: 35 minutes  SIGNED:   Rodena Goldmann, DO [facility] 08/08/2019, 11:26 AM   If 7PM-7AM, please contact night-coverage [email or web address]

## 2019-08-08 NOTE — Progress Notes (Signed)
Has been alert to self today but has not know where he is,  why he is here or the month, day or year.

## 2019-08-08 NOTE — Progress Notes (Signed)
Physical Therapy Treatment Patient Details Name: Erik [name] MRN: [id number] DOB: October 17, 1955 Today's Date: 08/08/2019    History of Present Illness Erik [name] is a 64 y.o. male with medical history significant for metastatic lung cancer, COPD, alcohol abuse, psoriasis, coronary artery disease.Brought to the ED via EMS, retractions, patient's neighbor went to check on him and found him to be confused.  Neighbors also reported that patient has no family, and is unable to take care of himself.At the time of my evaluation, patient is awake and alert, answers a few questions appropriately, but otherwise he is unable to remember or is confused.  He reports he has been having pain with urination over the past 4 weeks.  He reports intermittent vomiting, intermittent loose stools, he has chronic abdominal pain.  He reports that he has been falling, he is unable to tell me what he has been eating.    PT Comments    RN states patient is good to participate after fall this morning. Patient agreeable to work with PT. Patient begins supine exercises with good mechanics following prior demonstration. Patient experiencing LLE pain with SLR and movement of LLE. Patient able to transition to seated EOB without physical assist. He shows good sitting balance and sitting tolerance today. Patient returned to supine and educated on the importance of calling for assistance if he needs something. Patient appears confused today. Patient will benefit from continued physical therapy in hospital and recommended venue below to increase strength, balance, endurance for safe ADLs and gait.    Follow Up Recommendations  SNF     Equipment Recommendations  None recommended by PT    Recommendations for Other Services       Precautions / Restrictions Precautions Precautions: Fall Restrictions Weight Bearing Restrictions: No    Mobility  Bed Mobility Overal bed mobility: Needs Assistance Bed Mobility: Supine to  Sit;Sit to Supine     Supine to sit: Supervision Sit to supine: Supervision   General bed mobility comments: slightly slow transfer to seated EOB  Transfers                    Ambulation/Gait                 Stairs             Wheelchair Mobility    Modified Rankin (Stroke Patients Only)       Balance Overall balance assessment: Needs assistance Sitting-balance support: Feet supported;No upper extremity supported Sitting balance-Leahy Scale: Fair Sitting balance - Comments: fair/good seated at EOB                                    Cognition Arousal/Alertness: Awake/alert Behavior During Therapy: WFL for tasks assessed/performed Overall Cognitive Status: Impaired/Different from baseline                                 General Comments: Patient appears confused      Exercises General Exercises - Lower Extremity Ankle Circles/Pumps: AROM;Both;20 reps;Supine Quad Sets: AROM;Both;20 reps;Supine Heel Slides: AROM;Both;20 reps;Supine Straight Leg Raises: AROM;Both;5 reps;Supine    General Comments        Pertinent Vitals/Pain Pain Assessment: Faces Faces Pain Scale: Hurts little more Pain Location: abdomen Pain Descriptors / Indicators: Aching;Sore Pain Intervention(s): Limited activity within patient's tolerance;Monitored during session  Home Living                      Prior Function            PT Goals (current goals can now be found in the care plan section) Acute Rehab PT Goals Patient Stated Goal: return home after rehab PT Goal Formulation: With patient Time For Goal Achievement: 08/21/19 Potential to Achieve Goals: Good Progress towards PT goals: Progressing toward goals    Frequency    Min 3X/week      PT Plan Current plan remains appropriate    Co-evaluation              AM-PAC PT "6 Clicks" Mobility   Outcome Measure  Help needed turning from your back to your  side while in a flat bed without using bedrails?: None Help needed moving from lying on your back to sitting on the side of a flat bed without using bedrails?: A Little Help needed moving to and from a bed to a chair (including a wheelchair)?: A Lot Help needed standing up from a chair using your arms (e.g., wheelchair or bedside chair)?: A Little Help needed to walk in hospital room?: A Lot Help needed climbing 3-5 steps with a railing? : A Lot 6 Click Score: 16    End of Session   Activity Tolerance: Patient tolerated treatment well;Patient limited by fatigue Patient left: in bed;with call bell/phone within reach Nurse Communication: Mobility status PT Visit Diagnosis: Unsteadiness on feet (R26.81);Other abnormalities of gait and mobility (R26.89);Muscle weakness (generalized) (M62.81)     Time: 0254-2706 PT Time Calculation (min) (ACUTE ONLY): 10 min  Charges:  $Therapeutic Exercise: 8-22 mins                     2:46 PM, 08/08/19 Mearl Latin PT, DPT Physical Therapist at [facility]

## 2019-08-08 NOTE — Progress Notes (Signed)
   08/08/19 1132  Assess: MEWS Score  Temp 97.8 F (36.6 C)  BP (!) 126/103  Pulse Rate (!) 120  SpO2 97 %  O2 Device Room Air  Assess: MEWS Score  MEWS Temp 0  MEWS Systolic 0  MEWS Pulse 2  MEWS RR 0  MEWS LOC 0  MEWS Score 2  MEWS Score Color Yellow  Assess: if the MEWS score is Yellow or Red  Were vital signs taken at a resting state? Yes  Focused Assessment Documented focused assessment  Early Detection of Sepsis Score *See Row Information* High  MEWS guidelines implemented *See Row Information* Yes  Treat  MEWS Interventions Other (Comment) (none)  Take Vital Signs  Increase Vital Sign Frequency  Yellow: Q 2hr X 2 then Q 4hr X 2, if remains yellow, continue Q 4hrs  Escalate  MEWS: Escalate Yellow: discuss with charge nurse/RN and consider discussing with provider and RRT  Notify: Charge Nurse/RN  Name of Charge Nurse/RN Notified Larene Beach  Date Charge Nurse/RN Notified 08/08/19  Time Charge Nurse/RN Notified 1152  Notify: Provider  Provider Name/Title Manuella Ghazi  Date Provider Notified 08/08/19  Time Provider Notified 1152  Notification Type  (talked)  Notification Reason Other (Comment)  Response No new orders  Document  Patient Outcome Other (Comment) (monitoring)

## 2019-08-08 NOTE — TOC Progression Note (Signed)
Transition of Care [facility]) - Progression Note    Patient Details  Name: Erik [name] MRN: [id number] Date of Birth: 1955-04-25  Transition of Care [facility]) CM/SW Contact  Erik Lucks, RN Phone Number: 08/08/2019, 3:06 PM  Clinical Narrative:   [facility] starting INS AUTH on [place]. This is a new process and their first one. Patient has had both COVID vaccines. TOC spoke with Tammy at [facility], they will not allow patient to leave facility to come to cancer center for treatment. Patient is scheduled for 7/12 and 7/14. TOC spoke with Diane RN, she will cancel his appointment and advised to have patient reschedule when discharging from [facility]. Patient is confused today. TOC updated [facility] and Inez Catalina- friend. Inez Catalina will help him get appointments rescheduled. she will bring belongings to hospital for patient today for patient to have at the facility. TOC follow for INS AUTH.     Expected Discharge Plan: [place] Barriers to Discharge: No Barriers Identified  Expected Discharge Plan and Services Expected Discharge Plan: [place] arrangements for the past 2 months: Apartment Expected Discharge Date: 08/08/19

## 2019-08-08 NOTE — Progress Notes (Signed)
Patient is going to [facility] in [place] and they do not allow transport outside of facility for anything.  So we have cancelled his treatments until he is discharged.  I have sent a message to the case manager caring for him as inpatient to notify of cancelled appts and for them to call us once he is discharged home.  She reports that she will update Inez Catalina, patient's contact person.

## 2019-08-09 LAB — URINE CULTURE: Culture: >=100000 — AB

## 2019-08-09 NOTE — Progress Notes (Signed)
   08/09/19 1633  Assess: MEWS Score  Temp 98.7 F (37.1 C)  BP 120/82  Pulse Rate (!) 117  Resp 16  SpO2 96 %  O2 Device Room Air  Assess: MEWS Score  MEWS Temp 0  MEWS Systolic 0  MEWS Pulse 2  MEWS RR 0  MEWS LOC 0  MEWS Score 2  MEWS Score Color Yellow  Assess: if the MEWS score is Yellow or Red  Were vital signs taken at a resting state? Yes  Focused Assessment Documented focused assessment  Early Detection of Sepsis Score *See Row Information* Medium  MEWS guidelines implemented *See Row Information* Yes  Treat  MEWS Interventions Other (Comment) (will continue to monitor)  Take Vital Signs  Increase Vital Sign Frequency  Yellow: Q 2hr X 2 then Q 4hr X 2, if remains yellow, continue Q 4hrs

## 2019-08-09 NOTE — Progress Notes (Signed)
Patient seen and evaluated this morning.  He continues to remain mostly confused.  CT head reviewed with no acute findings noted.  Continue Rocephin with noted susceptibility for Proteus UTI.  Awaiting SNF placement.  Please refer to discharge summary dictated 7/2 for full details.  Total care time: 15 minutes.

## 2019-08-09 NOTE — Progress Notes (Signed)
   08/09/19 1425  Assess: MEWS Score  Temp 98.2 F (36.8 C)  BP 98/78  Pulse Rate (!) 115  Resp 16  SpO2 97 %  O2 Device Room Air  Assess: MEWS Score  MEWS Temp 0  MEWS Systolic 1  MEWS Pulse 2  MEWS RR 0  MEWS LOC 0  MEWS Score 3  MEWS Score Color Yellow  Assess: if the MEWS score is Yellow or Red  Were vital signs taken at a resting state? Yes  Focused Assessment Documented focused assessment  Early Detection of Sepsis Score *See Row Information* Medium  MEWS guidelines implemented *See Row Information* Yes  Treat  MEWS Interventions Other (Comment)  Take Vital Signs  Increase Vital Sign Frequency  Yellow: Q 2hr X 2 then Q 4hr X 2, if remains yellow, continue Q 4hrs  Escalate  MEWS: Escalate Yellow: discuss with charge nurse/RN and consider discussing with provider and RRT  Notify: Charge Nurse/RN  Name of Charge Nurse/RN Notified Lattie Haw  Date Charge Nurse/RN Notified 08/09/19  Time Charge Nurse/RN Notified 1440

## 2019-08-10 LAB — CBC
HCT: 37.7 % — ABNORMAL LOW (ref 39.0–52.0)
Hemoglobin: 12 g/dL — ABNORMAL LOW (ref 13.0–17.0)
MCH: 31.2 pg (ref 26.0–34.0)
MCHC: 31.8 g/dL (ref 30.0–36.0)
MCV: 97.9 fL (ref 80.0–100.0)
Platelets: 157 10*3/uL (ref 150–400)
RBC: 3.85 MIL/uL — ABNORMAL LOW (ref 4.22–5.81)
RDW: 16 % — ABNORMAL HIGH (ref 11.5–15.5)
WBC: 3.7 10*3/uL — ABNORMAL LOW (ref 4.0–10.5)
nRBC: 0.8 % — ABNORMAL HIGH (ref 0.0–0.2)

## 2019-08-10 LAB — BASIC METABOLIC PANEL
Anion gap: 12 (ref 5–15)
BUN: 6 mg/dL — ABNORMAL LOW (ref 8–23)
CO2: 29 mmol/L (ref 22–32)
Calcium: 9.3 mg/dL (ref 8.9–10.3)
Chloride: 93 mmol/L — ABNORMAL LOW (ref 98–111)
Creatinine, Ser: 0.76 mg/dL (ref 0.61–1.24)
GFR calc Af Amer: >60 mL/min (ref >60)
GFR calc non Af Amer: >60 mL/min (ref >60)
Glucose, Bld: 131 mg/dL — ABNORMAL HIGH (ref 70–99)
Potassium: 3.4 mmol/L — ABNORMAL LOW (ref 3.5–5.1)
Sodium: 134 mmol/L — ABNORMAL LOW (ref 135–145)

## 2019-08-10 LAB — MAGNESIUM: Magnesium: 1.5 mg/dL — ABNORMAL LOW (ref 1.7–2.4)

## 2019-08-10 MED ORDER — BUSPIRONE HCL 5 MG PO TABS
10.0000 mg | ORAL_TABLET | Freq: Two times a day (BID) | ORAL | Status: DC
  Administered 2019-08-10 – 2019-08-11 (×4): 10 mg via ORAL
  Filled 2019-08-10 (×5): qty 2

## 2019-08-10 MED ORDER — LORAZEPAM 1 MG PO TABS
1.0000 mg | ORAL_TABLET | ORAL | Status: DC | PRN
  Administered 2019-08-11 (×2): 1 mg via ORAL
  Filled 2019-08-10 (×2): qty 1

## 2019-08-10 MED ORDER — FOLIC ACID 1 MG PO TABS
1.0000 mg | ORAL_TABLET | Freq: Every day | ORAL | Status: DC
  Administered 2019-08-10 – 2019-08-11 (×2): 1 mg via ORAL
  Filled 2019-08-10 (×3): qty 1

## 2019-08-10 MED ORDER — THIAMINE HCL 100 MG PO TABS
100.0000 mg | ORAL_TABLET | Freq: Every day | ORAL | Status: DC
  Administered 2019-08-10 – 2019-08-11 (×2): 100 mg via ORAL
  Filled 2019-08-10 (×2): qty 1

## 2019-08-10 MED ORDER — THIAMINE HCL 100 MG/ML IJ SOLN
100.0000 mg | Freq: Every day | INTRAMUSCULAR | Status: DC
  Filled 2019-08-10 (×2): qty 2

## 2019-08-10 MED ORDER — LORAZEPAM 2 MG/ML IJ SOLN
1.0000 mg | INTRAMUSCULAR | Status: DC | PRN
  Administered 2019-08-12: 1 mg via INTRAVENOUS
  Filled 2019-08-10: qty 1

## 2019-08-10 MED ORDER — ADULT MULTIVITAMIN W/MINERALS CH
1.0000 | ORAL_TABLET | Freq: Every day | ORAL | Status: DC
  Administered 2019-08-10 – 2019-08-11 (×2): 1 via ORAL
  Filled 2019-08-10 (×3): qty 1

## 2019-08-10 MED ORDER — POTASSIUM CHLORIDE CRYS ER 20 MEQ PO TBCR
40.0000 meq | EXTENDED_RELEASE_TABLET | Freq: Two times a day (BID) | ORAL | Status: AC
  Administered 2019-08-10 (×2): 40 meq via ORAL
  Filled 2019-08-10 (×2): qty 2

## 2019-08-10 MED ORDER — MAGNESIUM SULFATE 2 GM/50ML IV SOLN
2.0000 g | Freq: Once | INTRAVENOUS | Status: AC
  Administered 2019-08-10: 2 g via INTRAVENOUS
  Filled 2019-08-10: qty 50

## 2019-08-10 NOTE — Progress Notes (Signed)
Patient seen and evaluated this AM. He continues to have some confusion which I believe may be related to his some alcohol withdrawal. Plans to start CIWA protocol and resume home BuSpar at this point. Heart rates have also been slightly elevated overnight which may be a part of his withdrawal symptoms. Continue current treatment for Proteus UTI. Anticipate discharge in the next 1-2 days to SNF if stable and improved. TOC working on authorization. Please refer to discharge summary dictated 08/08/2019 for full details.  Total care time: 20 minutes.

## 2019-08-11 LAB — CULTURE, BLOOD (ROUTINE X 2)
Culture: NO GROWTH
Culture: NO GROWTH
Special Requests: ADEQUATE
Special Requests: ADEQUATE

## 2019-08-11 LAB — BASIC METABOLIC PANEL
Anion gap: 14 (ref 5–15)
BUN: 8 mg/dL (ref 8–23)
CO2: 27 mmol/L (ref 22–32)
Calcium: 9.7 mg/dL (ref 8.9–10.3)
Chloride: 94 mmol/L — ABNORMAL LOW (ref 98–111)
Creatinine, Ser: 0.82 mg/dL (ref 0.61–1.24)
GFR calc Af Amer: >60 mL/min (ref >60)
GFR calc non Af Amer: >60 mL/min (ref >60)
Glucose, Bld: 140 mg/dL — ABNORMAL HIGH (ref 70–99)
Potassium: 4 mmol/L (ref 3.5–5.1)
Sodium: 135 mmol/L (ref 135–145)

## 2019-08-11 LAB — MAGNESIUM: Magnesium: 2 mg/dL (ref 1.7–2.4)

## 2019-08-11 MED ORDER — THIAMINE HCL 100 MG/ML IJ SOLN
INTRAMUSCULAR | Status: AC
  Filled 2019-08-11: qty 6

## 2019-08-11 MED ORDER — THIAMINE HCL 100 MG/ML IJ SOLN
500.0000 mg | Freq: Three times a day (TID) | INTRAVENOUS | Status: AC
  Administered 2019-08-11 – 2019-08-13 (×6): 500 mg via INTRAVENOUS
  Filled 2019-08-11 (×2): qty 5
  Filled 2019-08-11: qty 4
  Filled 2019-08-11 (×3): qty 5

## 2019-08-11 NOTE — Progress Notes (Signed)
Patient seen and evaluated this AM.  He continues to have ongoing confusion, but baseline is unknown.  He does not appear agitated or anxious, nor is he hallucinating any further.  Plan to continue BuSpar and reassessed by tomorrow.  Maintain on CIWA precautions as well.  Discontinue Rocephin after today's dose to complete treatment for Proteus UTI.  Anticipate discharge in a.m. and will plan to update discharge summary at that time.  Plan for discharge to [facility].  Total care time: 20 minutes.

## 2019-08-11 NOTE — Progress Notes (Signed)
   08/11/19 1353  Assess: MEWS Score  BP 111/80  Pulse Rate (!) 118  Resp 16  SpO2 95 %  O2 Device Room Air  Assess: MEWS Score  MEWS Temp 0  MEWS Systolic 0  MEWS Pulse 2  MEWS RR 0  MEWS LOC 0  MEWS Score 2  MEWS Score Color Yellow  Assess: if the MEWS score is Yellow or Red  Were vital signs taken at a resting state? Yes  Focused Assessment Documented focused assessment  Early Detection of Sepsis Score *See Row Information* Medium  MEWS guidelines implemented *See Row Information* No, other (Comment)  Take Vital Signs  Increase Vital Sign Frequency  Yellow: Q 2hr X 2 then Q 4hr X 2, if remains yellow, continue Q 4hrs  Escalate  MEWS: Escalate Yellow: discuss with charge nurse/RN and consider discussing with provider and RRT  Notify: Charge Nurse/RN  Name of Charge Nurse/RN Notified L. Bullins RN  Date Charge Nurse/RN Notified 08/11/19  Time Charge Nurse/RN Notified 1433  Notify: Provider  Provider Name/Title Dr. Brigitte Pulse  Date Provider Notified 08/11/19  Time Provider Notified 1424  Notification Type Page  Notification Reason Change in status  Response No new orders  Date of Provider Response 08/11/19  Time of Provider Response 1431  Document  Patient Outcome  (follow [place] protocol)  Progress note created (see row info) Yes

## 2019-08-12 ENCOUNTER — Inpatient Hospital Stay (HOSPITAL_COMMUNITY): Payer: Medicaid Other

## 2019-08-12 DIAGNOSIS — I469 Cardiac arrest, cause unspecified: Secondary | ICD-10-CM

## 2019-08-12 DIAGNOSIS — J9601 Acute respiratory failure with hypoxia: Secondary | ICD-10-CM

## 2019-08-12 DIAGNOSIS — J690 Pneumonitis due to inhalation of food and vomit: Secondary | ICD-10-CM

## 2019-08-12 LAB — BLOOD GAS, ARTERIAL
Acid-Base Excess: 2.3 mmol/L — ABNORMAL HIGH (ref 0.0–2.0)
Bicarbonate: 26.1 mmol/L (ref 20.0–28.0)
FIO2: 100
O2 Saturation: 93 %
Patient temperature: 38.4
pCO2 arterial: 47.6 mmHg (ref 32.0–48.0)
pH, Arterial: 7.376 (ref 7.350–7.450)
pO2, Arterial: 79.8 mmHg — ABNORMAL LOW (ref 83.0–108.0)

## 2019-08-12 LAB — APTT: aPTT: >200 seconds (ref 24–36)

## 2019-08-12 LAB — GLUCOSE, CAPILLARY
Glucose-Capillary: 119 mg/dL — ABNORMAL HIGH (ref 70–99)
Glucose-Capillary: 132 mg/dL — ABNORMAL HIGH (ref 70–99)

## 2019-08-12 MED ORDER — ORAL CARE MOUTH RINSE
15.0000 mL | OROMUCOSAL | Status: DC
  Administered 2019-08-12 – 2019-08-14 (×22): 15 mL via OROMUCOSAL

## 2019-08-12 MED ORDER — FLUMAZENIL 0.5 MG/5ML IV SOLN
INTRAVENOUS | Status: AC
  Filled 2019-08-12: qty 5

## 2019-08-12 MED ORDER — PANTOPRAZOLE SODIUM 40 MG IV SOLR
40.0000 mg | Freq: Every day | INTRAVENOUS | Status: DC
  Administered 2019-08-12 – 2019-08-14 (×3): 40 mg via INTRAVENOUS
  Filled 2019-08-12 (×3): qty 40

## 2019-08-12 MED ORDER — PROPOFOL 1000 MG/100ML IV EMUL
0.0000 ug/kg/min | INTRAVENOUS | Status: DC
  Administered 2019-08-12: 5 ug/kg/min via INTRAVENOUS
  Administered 2019-08-12: 15 ug/kg/min via INTRAVENOUS
  Administered 2019-08-13 (×2): 20 ug/kg/min via INTRAVENOUS
  Administered 2019-08-13: 35 ug/kg/min via INTRAVENOUS
  Administered 2019-08-14 (×2): 25 ug/kg/min via INTRAVENOUS
  Filled 2019-08-12 (×7): qty 100

## 2019-08-12 MED ORDER — CHLORHEXIDINE GLUCONATE CLOTH 2 % EX PADS
6.0000 | MEDICATED_PAD | Freq: Every day | CUTANEOUS | Status: DC
  Administered 2019-08-12 – 2019-08-14 (×3): 6 via TOPICAL

## 2019-08-12 MED ORDER — HEPARIN (PORCINE) 25000 UT/250ML-% IV SOLN
1300.0000 [IU]/h | INTRAVENOUS | Status: DC
  Administered 2019-08-12: 1300 [IU]/h via INTRAVENOUS
  Filled 2019-08-12: qty 250

## 2019-08-12 MED ORDER — DEXMEDETOMIDINE HCL IN NACL 400 MCG/100ML IV SOLN
0.4000 ug/kg/h | INTRAVENOUS | Status: DC
  Administered 2019-08-12: 0.4 ug/kg/h via INTRAVENOUS

## 2019-08-12 MED ORDER — PIPERACILLIN-TAZOBACTAM 3.375 G IVPB
3.3750 g | Freq: Three times a day (TID) | INTRAVENOUS | Status: DC
  Administered 2019-08-12 – 2019-08-14 (×5): 3.375 g via INTRAVENOUS
  Filled 2019-08-12 (×5): qty 50

## 2019-08-12 MED ORDER — CHLORHEXIDINE GLUCONATE 0.12% ORAL RINSE (MEDLINE KIT)
15.0000 mL | Freq: Two times a day (BID) | OROMUCOSAL | Status: DC
  Administered 2019-08-12 – 2019-08-14 (×4): 15 mL via OROMUCOSAL

## 2019-08-12 MED ORDER — FENTANYL CITRATE (PF) 100 MCG/2ML IJ SOLN
50.0000 ug | INTRAMUSCULAR | Status: DC | PRN
  Administered 2019-08-14: 200 ug via INTRAVENOUS
  Filled 2019-08-12: qty 4

## 2019-08-12 MED ORDER — LEVALBUTEROL HCL 0.63 MG/3ML IN NEBU
0.6300 mg | INHALATION_SOLUTION | Freq: Four times a day (QID) | RESPIRATORY_TRACT | Status: DC | PRN
  Administered 2019-08-12: 0.63 mg via RESPIRATORY_TRACT
  Filled 2019-08-12: qty 3

## 2019-08-12 MED ORDER — SODIUM CHLORIDE 0.9 % IV SOLN: INTRAVENOUS | Status: DC

## 2019-08-12 MED ORDER — MIDAZOLAM HCL 2 MG/2ML IJ SOLN
2.0000 mg | INTRAMUSCULAR | Status: DC | PRN
  Administered 2019-08-12 – 2019-08-14 (×6): 2 mg via INTRAVENOUS
  Filled 2019-08-12 (×6): qty 2

## 2019-08-12 MED ORDER — PIPERACILLIN-TAZOBACTAM 3.375 G IVPB
3.3750 g | Freq: Once | INTRAVENOUS | Status: AC
  Administered 2019-08-12: 3.375 g via INTRAVENOUS
  Filled 2019-08-12: qty 50

## 2019-08-12 MED ORDER — IOHEXOL 300 MG/ML  SOLN
100.0000 mL | Freq: Once | INTRAMUSCULAR | Status: AC | PRN
  Administered 2019-08-12: 100 mL via INTRAVENOUS

## 2019-08-12 NOTE — Progress Notes (Signed)
ANTICOAGULATION CONSULT NOTE - Initial Consult  Pharmacy Consult for heparin IV Indication: history of PE- currently NPO  No Known Allergies  Patient Measurements: Height: 6\' 2"  (188 cm) Weight: 88.7 kg (195 lb 8.8 oz) IBW/kg (Calculated) : 82.2 Heparin Dosing Weight: 88 kg  Vital Signs: Temp: 101.5 F (38.6 C) (07/06 1530) Temp Source: Oral (07/06 1200) BP: 91/66 (07/06 1530) Pulse Rate: 120 (07/06 1530)  Labs: Recent Labs    08/10/19 0524 08/11/19 0437  HGB 12.0*  --   HCT 37.7*  --   PLT 157  --   CREATININE 0.76 0.82    Estimated Creatinine Clearance: 110 mL/min (by C-G formula based on SCr of 0.82 mg/dL).   Medical History: Past Medical History:  Diagnosis Date  . Abnormal stress echocardiogram   . Adenocarcinoma of transverse colon ([place]) 02/10/2015  . Alcohol abuse    Heavy Use up until 2010  . Anxiety   . Blood transfusion without reported diagnosis   . Cancer of overlapping sites of colon metastatic to intra-abdominal lymph node ([place]) 12/26/2017  . COPD (chronic obstructive pulmonary disease) ([place])   . Depression   . Emphysema of lung ([place])   . GERD (gastroesophageal reflux disease)   . Head trauma 2001   closed head injury; coma for 4 weeks  . Hypercholesterolemia   . Hypertension   . ING HERN W/GANGREN RECUR UNILAT/UNSPEC ING HERN 04/21/2009   Qualifier: Diagnosis of  By: Verl Blalock, MD, Delanna Ahmadi PTSD (post-traumatic stress disorder)   . Pulmonary nodule 06/14/2016  . SAH (subarachnoid hemorrhage) ([place]) 08/31/2012  . SDH (subdural hematoma) ([place]) 08/31/2012    Medications:  Medications Prior to Admission  Medication Sig Dispense Refill Last Dose  . amitriptyline (ELAVIL) 25 MG tablet Take 1 tablet (25 mg total) by mouth at bedtime. 90 tablet 0 Past Week at Unknown time  . atorvastatin (LIPITOR) 40 MG tablet Take 40 mg by mouth daily.   Past Week at Unknown time  . buPROPion (WELLBUTRIN SR) 150 MG 12 hr tablet Take 150 mg by mouth every morning.     Past Week at Unknown time  . fluorouracil CALGB 58832 in sodium chloride 0.9 % 150 mL Inject 5,650 mg into the vein. Over 46 hours   Past Week at Unknown time  . gabapentin (NEURONTIN) 300 MG capsule TAKE 1 CAPSULE BY MOUTH THREE TIMES A DAY 120 capsule 6 Past Week at Unknown time  . IRINOTECAN HCL IV Inject 420 mg into the vein every 14 (fourteen) days.     Marland Kitchen LEUCOVORIN CALCIUM IV Inject 944 mg into the vein every 14 (fourteen) days.     Marland Kitchen lidocaine-prilocaine (EMLA) cream APPLY A SMALL AMOUNT OVER PORT SITE AND COVER WITH PLASTIC WRAP ONE HOUR PRIOR TO APPOINTMENT 30 g 0 Past Month at Unknown time  . loperamide (IMODIUM) 2 MG capsule TAKE 2 CAPSULES BY MOUTH TWICE DAILY 60 capsule 2 Past Month at Unknown time  . omeprazole (PRILOSEC) 20 MG capsule Take 20 mg by mouth every morning.    08/04/2019 at Unknown time  . tamsulosin (FLOMAX) 0.4 MG CAPS capsule TAKE 1 CAPSULE (0.4 MG TOTAL) BY MOUTH DAILY. 30 capsule 6 08/04/2019 at Unknown time  . Vitamin D, Ergocalciferol, (DRISDOL) 1.25 MG (50000 UT) CAPS capsule Take 50,000 Units by mouth once a week.   Past Week at Unknown time  . XARELTO 20 MG TABS tablet TAKE 1 TABLET BY MOUTH EVERY DAY WITH SUPPER (Patient taking differently: Take  20 mg by mouth daily with supper. ) 30 tablet 6 08/04/2019 at Unknown time  . busPIRone (BUSPAR) 10 MG tablet Take 10 mg by mouth 2 (two) times daily.     Marland Kitchen HYDROcodone-acetaminophen (NORCO) 5-325 MG tablet Take 1 tablet by mouth every 6 (six) hours as needed for moderate pain. 60 tablet 0   . nystatin (MYCOSTATIN/NYSTOP) powder Apply 1 application topically 4 (four) times daily. 60 g 0   . ondansetron (ZOFRAN) 8 MG tablet Take 1 tablet (8 mg total) by mouth 2 (two) times daily as needed for refractory nausea / vomiting. Start on day 3 after chemotherapy. (Patient not taking: Reported on 07/23/2019) 30 tablet 1   . PROAIR HFA 108 (90 Base) MCG/ACT inhaler INHALE 2 PUFFS BY MOUTH EVERY 6 HOURS AS NEEDED FOR SHORTNESS OF  BREATH/WHEEZING. (Patient not taking: Reported on 07/23/2019) 18 each 2   . prochlorperazine (COMPAZINE) 10 MG tablet Take 1 tablet (10 mg total) by mouth every 6 (six) hours as needed (NAUSEA). (Patient not taking: Reported on 07/23/2019) 30 tablet 1     Assessment: Pharmacy consulted to dose heparin in patient with history of PE.  Patient is currently NPO and due for CT abd/pelvis.  Patient is on Xarelto prior to initiation with last dose given 7/5 at 1530.  Will need to monitor based on aPTT until correlation with heparin levels.   Goal of Therapy:  Heparin level 0.3-0.7 units/ml aPTT 66-102 seconds Monitor platelets by anticoagulation protocol: Yes   Plan:  Start heparin infusion at 1300 units/hr. Check anti-Xa level in 6 hours and daily while on heparin. Continue to monitor H&H and platelets.   Margot Ables, PharmD Clinical Pharmacist 08/12/2019 3:55 PM

## 2019-08-12 NOTE — Progress Notes (Signed)
Pharmacy Antibiotic Note  Erik [name] is a 64 y.o. male admitted on 07/16/2019 with aspiration pneumonia.  Pharmacy has been consulted for Zosyn dosing.  Plan: Zosyn 3.375g IV q8h (4 hour infusion).  Monitor labs, c/s, and patient improvement.  Height: 6\' 2"  (188 cm) Weight: 88.7 kg (195 lb 8.8 oz) IBW/kg (Calculated) : 82.2  Temp (24hrs), Avg:97.9 F (36.6 C), Min:97.7 F (36.5 C), Max:98.5 F (36.9 C)  Recent Labs  Lab 07/18/2019 1551 07/28/2019 1904 08/07/19 0706 08/08/19 0904 08/10/19 0524 08/11/19 0437  WBC 3.5*  --   --  2.9* 3.7*  --   CREATININE 0.78  --  0.77 0.75 0.76 0.82  LATICACIDVEN  --  1.1  --   --   --   --     Estimated Creatinine Clearance: 110 mL/min (by C-G formula based on SCr of 0.82 mg/dL).    No Known Allergies  Antimicrobials this admission: Zosyn 7/6 >>  CTX 7/1 >> 7/5   Microbiology results:  6/30 BCx: ng 6/30 UCx: proteus    Thank you for allowing pharmacy to be a part of this patients care.  Ramond Craver 08/12/2019 1:08 PM

## 2019-08-12 NOTE — Progress Notes (Signed)
SBP in 80s; nurse titrated propofol down to 87mcg; SBP now in 90s-100s; nurse notified MD of absent pedal pulses with doppler; no new orders obtained; Per MD pt's prognosis is overall poor and family is aware of situation.

## 2019-08-12 NOTE — TOC Progression Note (Signed)
Transition of Care [facility]) - Progression Note    Patient Details  Name: Erik [name] MRN: [id number] Date of Birth: 09/28/1955  Transition [facility] Care [facility]) CM/SW Contact  Boneta Lucks, RN Phone Number: 08/12/2019, 1:52 PM  Clinical Narrative:  TOC updated [facility]. Still working on [employer], INS AUTH is pending. Patient declining today, Intubated and moved to ICU. TOC to follow.     Expected Discharge Plan: [place] Barriers to Discharge: No Barriers Identified  Expected Discharge Plan and Services Expected Discharge Plan: [place] arrangements for the past 2 months: Apartment Expected Discharge Date: 08/08/19

## 2019-08-12 NOTE — Progress Notes (Signed)
PROGRESS NOTE    Erik [name]  AYT:[id number] DOB: 1955-07-26 DOA: 07/09/2019 PCP: Alanson Puls [facility]   Brief Narrative:  Per HPI: Erik [name] a 64 y.o.malewith medical history significant formetastatic lung cancer, COPD, alcohol abuse, psoriasis, coronary artery disease. Brought to the ED via EMS,retractions, patient's neighbor went to check on him and found him to be confused. Neighbors also reported that patient has no family, and is unable to take care of himself. At the time of my evaluation, patient is awakeandalert,answers a few questions appropriately, but otherwise he is unable to remember or is confused. He reports he has been having pain with urination over the past 4 weeks. He reports intermittent vomiting, intermittent loose stools, he has chronic abdominal pain.  He reports that he has been falling, he is unable to tell me what he has been eating.  Apparently in the ED there was dried stool on patient's legs.  ED Course:Temperature 97.6, heart rate 90s 200s, blood pressure systolic 355-732K. O2 sats greater than 95% on room air. WBC 3.5. Sodium 128. Ammonia level 22. INR 1.2. UA shows positive nitrite and large leukocytes with few bacteria. Head CT shows old infarct without acute abnormality. Two-view chest x-ray without acute abnormality. Covid test negative. IV ceftriaxone started, 1 L bolus given. Hospitalist admit for altered mental status, UTI.  -Patient had been treated with Rocephin for total 5-day course for Proteus UTI which was likely causing acute metabolic encephalopathy and he did have improvement in overall alertness, but continued to remain confused and would have periods of tachycardia and anxiety.  This was suspected to be related to alcohol withdrawal and he was started on CIWA protocol with Ativan given as needed.  He continued to remain confused and therefore was thought to have Warnicke's encephalopathy and has been  started on high-dose IV thiamine.  Unfortunately on the morning of 7/6 he developed some aspiration pneumonia and started to become more hypoxemic.  He also had received Ativan earlier in the day per CIWA protocol.   He had a cardiac arrest and underwent CPR for a little under 10 minutes with ROSC.  He was intubated and brought to the ICU for further evaluation and management.  He was started on Zosyn.  He was seen by PCCM as well and CT of the chest as well as abdomen and pelvis were performed on account of significant amounts of feculent material noted on NG suctioning.  He is now noted to have significant ileus with signs of ischemic changes in his abdomen as well as portal venous gas and pneumobilia.  Case was reviewed with radiology as well as Dr. Constance Haw of general surgery with further aggressive care noted to be futile.    Discussions were had with his next of kin which is his daughter [name] who understands that he has a grave prognosis and decision was made to keep him DNR.  Palliative care consulted with death likely eminent.  Assessment & Plan:   Active Problems:   Acute metabolic encephalopathy   Acute hypoxemic respiratory failure secondary to aspiration pneumonitis -Appreciate PCCM evaluation and management -Continue on ventilator support -Zosyn ordered for aspiration coverage -Sputum cultures ordered and pending  Status post PEA arrest 7/6 -Likely secondary to aspiration related hypoxemia -With ROSC after approximately 10 minutes of CPR -Currently in ICU on ventilator  Acute, persistent metabolic encephalopathy likely now in relation to alcohol related causes -Noted withdrawal as well as suspicion of Warnicke's encephalopathy -Continue on high-dose IV thiamine -  Propofol for sedation  Proteus UTI -Status post treatment with Rocephin, completed -Foley catheter placed while on ventilator support  Abdominal distention secondary to ileus with noted pneumobilia and portal  venous gas and signs of ischemic change -Maintain NG tube to suction and keep n.p.o. -IV Zosyn for coverage -Discussed case with general surgery with intervention noted to be futile -Palliative care consultation with likely need to transition to comfort care soon  History of PE -Xarelto changed to heparin drip after intubation  History of metastatic colon cancer -Followed by Dr. Delton Coombes -Currently on chemotherapy -Prior right hemicolectomy  BPH -Hold Flomax  Chronic diarrhea  COPD -Currently intubated  History of CAD  DVT prophylaxis: Heparin drip Code Status: DNR Family Communication: Discussed with daughter [name] who is the next of kin Disposition Plan:   Status is: Inpatient  Remains inpatient appropriate because:Altered mental status, Unsafe d/c plan, IV treatments appropriate due to intensity of illness or inability to take PO and Inpatient level of care appropriate due to severity of illness   Dispo: The patient is from: Home              Anticipated d/c is to: SNF              Anticipated d/c date is: 1 day              Patient currently is not medically stable to d/c.  Anticipate in-hospital death.   Consultants:   PCCM  Discussed with general surgery  Palliative care  Procedures:   Intubation 7/6  See imaging below  Antimicrobials:  Anti-infectives (From admission, onward)   Start     Dose/Rate Route Frequency Ordered Stop   08/12/19 2200  piperacillin-tazobactam (ZOSYN) IVPB 3.375 g     Discontinue     3.375 g 12.5 mL/hr over 240 Minutes Intravenous Every 8 hours 08/12/19 1307     08/12/19 1330  piperacillin-tazobactam (ZOSYN) IVPB 3.375 g        3.375 g 100 mL/hr over 30 Minutes Intravenous  Once 08/12/19 1306 08/12/19 1420   08/08/19 0000  cefdinir (OMNICEF) 300 MG capsule     Discontinue     300 mg Oral 2 times daily 08/08/19 1121 08/13/19 2359   08/07/19 2000  cefTRIAXone (ROCEPHIN) 1 g in sodium chloride 0.9 % 100 mL IVPB        1  g 200 mL/hr over 30 Minutes Intravenous Every 24 hours 07/08/2019 2133 08/11/19 2100   08/04/2019 1900  cefTRIAXone (ROCEPHIN) 1 g in sodium chloride 0.9 % 100 mL IVPB        1 g 200 mL/hr over 30 Minutes Intravenous  Once 07/18/2019 1850 07/31/2019 2055       Subjective: Patient seen and evaluated earlier this morning and continued to remain confused.  In the interim, he has had a cardiac arrest after what appears to be an aspiration episode with some associated hypoxemia.  He is now intubated in ICU.  Objective: Vitals:   08/12/19 1445 08/12/19 1500 08/12/19 1515 08/12/19 1530  BP: 99/71   91/66  Pulse: (!) 126 (!) 126 (!) 122 (!) 120  Resp: (!) 24 (!) 24 (!) 21 (!) 24  Temp: (!) 101.7 F (38.7 C) (!) 100.6 F (38.1 C) 99.7 F (37.6 C) (!) 101.5 F (38.6 C)  TempSrc:      SpO2: 98% 97% 100% 100%  Weight:      Height:        Intake/Output Summary (Last 24  hours) at 08/12/2019 1628 Last data filed at 08/12/2019 1400 Gross per 24 hour  Intake 290 ml  Output 600 ml  Net -310 ml   Filed Weights   07/30/2019 1503 08/02/2019 2145  Weight: 90 kg 88.7 kg    Examination:  General exam: Sedated on ventilator Respiratory system: Clear to auscultation. Respiratory effort normal.  Intubated on ventilator Cardiovascular system: S1 & S2 heard, RRR. No JVD, murmurs, rubs, gallops or clicks. No pedal edema. Gastrointestinal system: Abdomen is distended, NG tube with feculent material Central nervous system: Sedated on ventilator Extremities: No edema Skin: No rashes, lesions or ulcers Psychiatry: Cannot be assessed    Data Reviewed: I have personally reviewed following labs and imaging studies  CBC: Recent Labs  Lab 07/29/2019 1551 08/08/19 0904 08/10/19 0524  WBC 3.5* 2.9* 3.7*  NEUTROABS 2.4  --   --   HGB 12.4* 12.1* 12.0*  HCT 36.7* 38.0* 37.7*  MCV 93.4 98.2 97.9  PLT 150 133* 808   Basic Metabolic Panel: Recent Labs  Lab 07/14/2019 1551 08/07/19 0706 08/08/19 0904  08/10/19 0524 08/11/19 0437  NA 128* 132* 134* 134* 135  K 3.7 4.1 3.2* 3.4* 4.0  CL 91* 94* 95* 93* 94*  CO2 '24 27 27 29 27  ' GLUCOSE 144* 115* 126* 131* 140*  BUN '10 10 8 ' 6* 8  CREATININE 0.78 0.77 0.75 0.76 0.82  CALCIUM 8.6* 8.6* 9.0 9.3 9.7  MG  --   --   --  1.5* 2.0   GFR: Estimated Creatinine Clearance: 110 mL/min (by C-G formula based on SCr of 0.82 mg/dL). Liver Function Tests: Recent Labs  Lab 07/20/2019 1551  AST 13*  ALT 11  ALKPHOS 99  BILITOT 0.9  PROT 6.5  ALBUMIN 3.0*   No results for input(s): LIPASE, AMYLASE in the last 168 hours. Recent Labs  Lab 07/11/2019 1551  AMMONIA 22   Coagulation Profile: Recent Labs  Lab 07/16/2019 1551  INR 1.2   Cardiac Enzymes: No results for input(s): CKTOTAL, CKMB, CKMBINDEX, TROPONINI in the last 168 hours. BNP (last 3 results) No results for input(s): PROBNP in the last 8760 hours. HbA1C: No results for input(s): HGBA1C in the last 72 hours. CBG: Recent Labs  Lab 08/05/2019 1547 08/12/19 1626  GLUCAP 141* 119*   Lipid Profile: No results for input(s): CHOL, HDL, LDLCALC, TRIG, CHOLHDL, LDLDIRECT in the last 72 hours. Thyroid Function Tests: No results for input(s): TSH, T4TOTAL, FREET4, T3FREE, THYROIDAB in the last 72 hours. Anemia Panel: No results for input(s): VITAMINB12, FOLATE, FERRITIN, TIBC, IRON, RETICCTPCT in the last 72 hours. Sepsis Labs: Recent Labs  Lab 08/05/2019 1904  LATICACIDVEN 1.1    Recent Results (from the past 240 hour(s))  SARS Coronavirus 2 by RT PCR (hospital order, performed in [facility] hospital lab) Nasopharyngeal Nasopharyngeal Swab     Status: None   Collection Time: 07/25/2019  5:34 PM   Specimen: Nasopharyngeal Swab  Result Value Ref Range Status   SARS Coronavirus 2 NEGATIVE NEGATIVE Final    Comment: (NOTE) SARS-CoV-2 target nucleic acids are NOT DETECTED.  The SARS-CoV-2 RNA is generally detectable in upper and lower respiratory specimens during the acute phase of  infection. The lowest concentration of SARS-CoV-2 viral copies this assay can detect is 250 copies / mL. A negative result does not preclude SARS-CoV-2 infection and should not be used as the sole basis for treatment or other patient management decisions.  A negative result may occur with improper specimen collection /  handling, submission of specimen other than nasopharyngeal swab, presence of viral mutation(s) within the areas targeted by this assay, and inadequate number of viral copies (<250 copies / mL). A negative result must be combined with clinical observations, patient history, and epidemiological information.  Fact Sheet for Patients:   StrictlyIdeas.no  Fact Sheet for Healthcare Providers: BankingDealers.co.za  This test is not yet approved or  cleared by the [place] FDA and has been authorized for detection and/or diagnosis of SARS-CoV-2 by FDA under an Emergency Use Authorization (EUA).  This EUA will remain in effect (meaning this test can be used) for the duration of the COVID-19 declaration under Section 564(b)(1) of the Act, 21 U.S.C. section 360bbb-3(b)(1), unless the authorization is terminated or revoked sooner.  Performed at [facility], [address] [place], [place] [address]   Urine culture     Status: Abnormal   Collection Time: 07/09/2019  5:49 PM   Specimen: Urine, Random  Result Value Ref Range Status   Specimen Description   Final    URINE, RANDOM Performed at [facility], [address], [place], [place] [address]    Special Requests   Final    NONE Performed at [facility], [address], [place], [place] [address]    Culture >=100,000 COLONIES/mL PROTEUS MIRABILIS (A)  Final   Report Status 08/09/2019 FINAL  Final   Organism ID, Bacteria PROTEUS MIRABILIS (A)  Final      Susceptibility   Proteus mirabilis - MIC*    AMPICILLIN <=2 SENSITIVE Sensitive     CEFAZOLIN <=4 SENSITIVE Sensitive      CEFTRIAXONE <=0.25 SENSITIVE Sensitive     CIPROFLOXACIN <=0.25 SENSITIVE Sensitive     GENTAMICIN <=1 SENSITIVE Sensitive     IMIPENEM 2 SENSITIVE Sensitive     NITROFURANTOIN 128 RESISTANT Resistant     TRIMETH/SULFA <=20 SENSITIVE Sensitive     AMPICILLIN/SULBACTAM <=2 SENSITIVE Sensitive     PIP/TAZO <=4 SENSITIVE Sensitive     * >=100,000 COLONIES/mL PROTEUS MIRABILIS  Culture, blood (routine x 2)     Status: None   Collection Time: 07/18/2019  7:04 PM   Specimen: BLOOD RIGHT FOREARM  Result Value Ref Range Status   Specimen Description BLOOD RIGHT FOREARM  Final   Special Requests   Final    BOTTLES DRAWN AEROBIC AND ANAEROBIC Blood Culture adequate volume   Culture   Final    NO GROWTH 5 DAYS Performed at [facility], [address] [place], [place] [address]    Report Status 08/11/2019 FINAL  Final  Culture, blood (routine x 2)     Status: None   Collection Time: 08/04/2019  7:15 PM   Specimen: BLOOD LEFT HAND  Result Value Ref Range Status   Specimen Description BLOOD LEFT HAND  Final   Special Requests   Final    BOTTLES DRAWN AEROBIC AND ANAEROBIC Blood Culture adequate volume   Culture   Final    NO GROWTH 5 DAYS Performed at [facility], [address] [place], [place] [address]    Report Status 08/11/2019 FINAL  Final         Radiology Studies: DG Chest 1 View  Result Date: 08/12/2019 CLINICAL DATA:  Hypoxemia, history of colon cancer, COPD, emphysema EXAM: CHEST  1 VIEW COMPARISON:  07/18/2019 FINDINGS: The heart size and mediastinal contours are within normal limits. Left chest port catheter. New heterogeneous airspace opacity of the left lung base. The visualized skeletal structures are unremarkable. IMPRESSION: New  heterogeneous airspace opacity of the left lung base, concerning for infection or aspiration. Electronically Signed   By: Eddie Candle M.D.   On: 08/12/2019 12:31   CT CHEST W CONTRAST  Result Date: 08/12/2019 CLINICAL DATA:  Cardiac arrest  prior to presentation, found with altered mental status by neighbor, UTI with antibiotic treatment, hypoxia, hypertension, GERD, emphysema, metastatic colon cancer, prior pulmonary embolism EXAM: CT CHEST, ABDOMEN, AND PELVIS WITH CONTRAST TECHNIQUE: Multidetector CT imaging of the chest, abdomen and pelvis was performed following the standard protocol during bolus administration of intravenous contrast. Sagittal and coronal MPR images reconstructed from axial data set. CONTRAST:  163m OMNIPAQUE IOHEXOL 300 MG/ML SOLN IV. No oral contrast administered. COMPARISON:  07/08/2019 FINDINGS: CT CHEST FINDINGS Cardiovascular: Atherosclerotic calcifications of the coronary arteries. Aorta normal caliber. Vascular structures patent on non targeted exam. No pericardial effusion or enlargement of cardiac chambers. Mediastinum/Nodes: Nasogastric tube traverses esophagus into stomach. Tip of endotracheal tube above carina. Base of cervical region normal appearance. No thoracic adenopathy. Lungs/Pleura: LEFT lower lobe consolidation. Additional mild infiltrate in lingula and in RIGHT lower lobe. Central peribronchial thickening. Mild emphysematous changes in upper lobes. No additional infiltrate, pleural effusion or pneumothorax. Musculoskeletal: Osseous structures unremarkable CT ABDOMEN PELVIS FINDINGS Hepatobiliary: Linear foci of gas within the anterior aspect of the LEFT lobe of the liver consistent with portal venous gas. No definite hepatic mass lesion. Gallbladder unremarkable. Pancreas: Unremarkable Spleen: Normal appearance. Tiny splenule anterior inferior to spleen Adrenals/Urinary Tract: Adrenal glands, kidneys, and ureters normal appearance. Bladder decompressed by Foley catheter, unable to exclude bladder wall thickening though this could be an artifact from underdistention Stomach/Bowel: Assessment of bowel limited secondary to motion. Diffuse dilatation of small bowel loops and stomach. Prior proximal colonic  resection with residual rectum and sigmoid colon in pelvis. Dilated small bowel loops extend to rectosigmoid anastomosis, in light of other findings favor ileus. Pneumatosis identified within small bowel loops in the mid abdomen. Findings are highly suspicious for small bowel ischemia with ileus, pneumatosis and portal venous gas. Vascular/Lymphatic: Thoracic vascular structures patent. Aorta normal caliber. No definite thrombus identified within portal or superior mesenteric veins, nor within celiac or superior mesenteric arteries. Reproductive: Calcifications within prostate gland. Seminal vesicles unremarkable. Other: No free air or free fluid. No hernia. Thickening/nodularity along the laparotomy scar in the anterior abdominal wall unchanged. Musculoskeletal: Severe motion artifacts degrade assessment in the pelvis. Probable LEFT hip joint effusion. IMPRESSION: Diffusely dilated small bowel loops with small bowel pneumatosis and portal venous gas within the liver highly suspicious for small bowel ischemia with associated ileus. No thrombus is identified within major vascular structures. Potentially ischemia could be on the basis of prior cardiac arrest and resuscitation. No evidence of hernia or perforation. LEFT lower lobe consolidation. Probable LEFT hip joint effusion. Aortic Atherosclerosis (ICD10-I70.0) and Emphysema (ICD10-J43.9). Critical Value/emergent results were called by telephone at the time of interpretation on 08/12/2019 at 1545 hours to provider DR[facility], who verbally acknowledged these results. Electronically Signed   By: MLavonia DanaM.D.   On: 08/12/2019 15:49   CT ABDOMEN PELVIS W CONTRAST  Result Date: 08/12/2019 CLINICAL DATA:  Cardiac arrest prior to presentation, found with altered mental status by neighbor, UTI with antibiotic treatment, hypoxia, hypertension, GERD, emphysema, metastatic colon cancer, prior pulmonary embolism EXAM: CT CHEST, ABDOMEN, AND PELVIS WITH CONTRAST  TECHNIQUE: Multidetector CT imaging of the chest, abdomen and pelvis was performed following the standard protocol during bolus administration of intravenous contrast. Sagittal and coronal  MPR images reconstructed from axial data set. CONTRAST:  117m OMNIPAQUE IOHEXOL 300 MG/ML SOLN IV. No oral contrast administered. COMPARISON:  07/08/2019 FINDINGS: CT CHEST FINDINGS Cardiovascular: Atherosclerotic calcifications of the coronary arteries. Aorta normal caliber. Vascular structures patent on non targeted exam. No pericardial effusion or enlargement of cardiac chambers. Mediastinum/Nodes: Nasogastric tube traverses esophagus into stomach. Tip of endotracheal tube above carina. Base of cervical region normal appearance. No thoracic adenopathy. Lungs/Pleura: LEFT lower lobe consolidation. Additional mild infiltrate in lingula and in RIGHT lower lobe. Central peribronchial thickening. Mild emphysematous changes in upper lobes. No additional infiltrate, pleural effusion or pneumothorax. Musculoskeletal: Osseous structures unremarkable CT ABDOMEN PELVIS FINDINGS Hepatobiliary: Linear foci of gas within the anterior aspect of the LEFT lobe of the liver consistent with portal venous gas. No definite hepatic mass lesion. Gallbladder unremarkable. Pancreas: Unremarkable Spleen: Normal appearance. Tiny splenule anterior inferior to spleen Adrenals/Urinary Tract: Adrenal glands, kidneys, and ureters normal appearance. Bladder decompressed by Foley catheter, unable to exclude bladder wall thickening though this could be an artifact from underdistention Stomach/Bowel: Assessment of bowel limited secondary to motion. Diffuse dilatation of small bowel loops and stomach. Prior proximal colonic resection with residual rectum and sigmoid colon in pelvis. Dilated small bowel loops extend to rectosigmoid anastomosis, in light of other findings favor ileus. Pneumatosis identified within small bowel loops in the mid abdomen. Findings are  highly suspicious for small bowel ischemia with ileus, pneumatosis and portal venous gas. Vascular/Lymphatic: Thoracic vascular structures patent. Aorta normal caliber. No definite thrombus identified within portal or superior mesenteric veins, nor within celiac or superior mesenteric arteries. Reproductive: Calcifications within prostate gland. Seminal vesicles unremarkable. Other: No free air or free fluid. No hernia. Thickening/nodularity along the laparotomy scar in the anterior abdominal wall unchanged. Musculoskeletal: Severe motion artifacts degrade assessment in the pelvis. Probable LEFT hip joint effusion. IMPRESSION: Diffusely dilated small bowel loops with small bowel pneumatosis and portal venous gas within the liver highly suspicious for small bowel ischemia with associated ileus. No thrombus is identified within major vascular structures. Potentially ischemia could be on the basis of prior cardiac arrest and resuscitation. No evidence of hernia or perforation. LEFT lower lobe consolidation. Probable LEFT hip joint effusion. Aortic Atherosclerosis (ICD10-I70.0) and Emphysema (ICD10-J43.9). Critical Value/emergent results were called by telephone at the time of interpretation on 08/12/2019 at 1545 hours to provider DR[facility], who verbally acknowledged these results. Electronically Signed   By: MLavonia DanaM.D.   On: 08/12/2019 15:49   DG Chest Port 1V same Day  Result Date: 08/12/2019 CLINICAL DATA:  Intubation.  OG tube placement. EXAM: PORTABLE CHEST 1 VIEW COMPARISON:  08/12/2019. FINDINGS: Tracheal tube noted with tip 4 cm above the carina. NG tube noted with tip below left hemidiaphragm. PowerPort catheter noted in stable position. Heart size normal. Low lung volumes with bibasilar atelectasis/infiltrates again noted without interim change. No prominent pleural effusion. Right costophrenic angle incompletely imaged. IMPRESSION: 1. Endotracheal tube and NG tube in good anatomic position.  PowerPort catheter stable position. 2. Low lung volumes with bibasilar atelectasis/infiltrates again noted without interval change. Tiny left pleural effusion cannot be excluded. Electronically Signed   By: TMarcello Moores Register   On: 08/12/2019 13:49        Scheduled Meds:  chlorhexidine gluconate (MEDLINE KIT)  15 mL Mouth Rinse BID   Chlorhexidine Gluconate Cloth  6 each Topical Daily   mouth rinse  15 mL Mouth Rinse 10 times per day   pantoprazole (PROTONIX) IV  40  mg Intravenous Daily   Continuous Infusions:  sodium chloride     heparin 1,300 Units/hr (08/12/19 1618)   piperacillin-tazobactam (ZOSYN)  IV     propofol (DIPRIVAN) infusion 25 mcg/kg/min (08/12/19 1500)   thiamine injection 500 mg (08/12/19 1622)     LOS: 6 days    Critical care time: 31 minutes    Raylynne Cubbage Darleen Crocker, DO [facility]  If 7PM-7AM, please contact night-coverage [email or web address] 08/12/2019, 4:28 PM

## 2019-08-12 NOTE — ED Provider Notes (Signed)
ED provider note:  I was called to the floor for a CODE BLUE.  Patient recently admitted for what sounds like alcohol withdrawal and hyponatremia.  He was receiving Ativan then this morning possibly aspirated.  Patient became less responsive to the point he was not breathing and pulses were not palpable.  A CODE BLUE was initiated and CPR begun.  When I arrived to the room, compressions were in progress and the respiratory therapist was in the process of establishing an airway.  She successfully placed a 7.5 endotracheal tube with tube placement confirmed with auscultation over the chest and stomach as well as end-tidal CO2.  CPR was continued and epinephrine given.  After several minutes of CPR, ROSC was achieved.  Patient now with palpable pulses and blood pressure measured at 180/100.  Patient will go to the intensive care unit.  Cardiopulmonary Resuscitation (CPR) Procedure Note Directed/Performed by: Veryl Speak I personally directed ancillary staff and/or performed CPR in an effort to regain return of spontaneous circulation and to maintain cardiac, neuro and systemic perfusion.    Veryl Speak, MD 08/12/19 1558

## 2019-08-12 NOTE — Progress Notes (Signed)
Called for pt with low O2 saturation. Pt has an O2 sat of 82% on room air. Pt placed on 6L [place] with little improvement. Pt encouraged to cough due to congested cough. Pt coughed up a moderate amount of thick yellowish secretions. BBS= Wheezes and Rhonchi. O2 remains in mid 80's. Pt complaining of difficulty breathing. Pt NTS suctioned x 2 with a small amount of secretions removed. Pt tolerated fairly well. O2 saturations improved after placing 100% NRB mask and suction. PRN Xopenex nebulizer was also given. Pt tolerated well. Placed pt on 4L [place] and instructed pt on flutter use. Pt tolerated flutter fairly well. Pt states he feels better. RN & NT at bedside. RT will continue to monitor pt

## 2019-08-12 NOTE — Progress Notes (Signed)
SLP Cancellation Note  Patient Details Name: BRYSE BLANCHETTE MRN: [id number] DOB: [birth year]   Cancelled treatment:       Reason Eval/Treat Not Completed: Patient not medically ready (Pt now intubated and transferred to ICU); SLP will sign off and need a new order when Pt is medically appropriate.   Thank you,  Genene Churn, [place]    [place] 08/12/2019, 1:24 PM

## 2019-08-12 NOTE — Consult Note (Signed)
NAME:  Erik [name], MRN:  [id number], DOB:  March 22, 1955, LOS: 6 ADMISSION DATE:  07/26/2019, CONSULTATION DATE:  08/12/2019 REFERRING MD:  Dr. Manuella Ghazi, [facility], CHIEF COMPLAINT:  Respiratory failure   Brief History   64 yo male former smoker found by his neighbor to be confused and smelling of urine and dried stool on his legs.  EMS called and he was brought to ER.  Found to have UTI, hyponatremia and started on antibiotics and IV fluids.  Developed worsening confusion and started on CIWA protocol.  Concern for aspiration event and progressive hypoxia on 08/12/19.  This lead to respiratory and then cardiac arrest with about 6 to 10 minutes for ROSC.  PCCM consulted to assist with ICU care.  Hx from chart and medical staff.  Past Medical History  COPD, ETOH, Metastatic colon cancer, Psoriasis, CAD, Pulmonary embolism, Anxiety, Depression, GERD, HLD, HTN, PTSD, SAH/SDH  Significant Hospital Events   6/30 Admit 7/04 start CIWA protocol 7/06 respiratory arrest, VDRF  Consults:    Procedures:  Lt port >> ETT 7/06 >>   Significant Diagnostic Tests:  CT head 7/02 >> chronic encephalomalacia Rt frontal and temporal lobes, atrophy  Micro Data:  Urine 6/30 >> Proteus, resistant to nitrofurantoin Blood 6/30 >> negative Sputum 7/06 >>   Antimicrobials:  Rocephin 6/30 >> 7/05 Zosyn 7/06 >>   Interim history/subjective:    Objective   Blood pressure 118/85, pulse (!) 138, temperature 98.4 F (36.9 C), resp. rate (!) 27, height 6\' 2"  (1.88 m), weight 88.7 kg, SpO2 93 %.    Vent Mode: PRVC FiO2 (%):  [100 %] 100 % Set Rate:  [16 bmp-18 bmp] 16 bmp Vt Set:  [640 mL] 640 mL PEEP:  [5 cmH20] 5 cmH20 Plateau Pressure:  [20 cmH20] 20 cmH20   Intake/Output Summary (Last 24 hours) at 08/12/2019 1404 Last data filed at 08/12/2019 0800 Gross per 24 hour  Intake 290 ml  Output --  Net 290 ml   Filed Weights   07/24/2019 1503 07/10/2019 2145  Weight: 90 kg 88.7 kg    Examination:  General -  on vent, precedex Eyes - pupils reactive ENT - ETT in place Cardiac - regular, tachycardic, no murmur Chest - scattered rhonchi, no wheeze Abdomen - distended, increased tympany Extremities - 1+ edema Skin - onychomycosis Neuro - moves all extremities, intermittently agitated, not following commands  Resolved Hospital Problem list   Proteus UTI, Hyponatremia  Assessment & Plan:   Acute hypoxic respiratory failure likely 2nd to aspiration pneumonitis. Hx of COPD. - full vent support - f/u CXR, ABG - goal SpO2 > 90% - prn BDs - day 1 of zosyn  Acute metabolic encephalopathy 2nd to alcohol withdrawal and hypoxia. Hx of anxiety, depression. - RASS goal 0 to -1 - change to diprivan with prn versed/fentanyl - continue thiamine - hold amitriptyline, bupropion, buspirone, gabapentin while NPO  Abdominal distention. - NPO - OG tube to suction - hospitalist arranging for CT abd/pelvis - d/c imodium  Hx of PE. - incidental finding on CT chest from April 2020 - hold xarelto while NPO >>  might need to switch to lovenox of heparin infusion  Hx of metastatic colon cancer. - diagnosed December 2016, Rt hemicolectomy January 2017 and then multiple rounds of chemotherapy; most recently on FOLFIRI and bevacizumab (infused end of June 2021) - CT imaging from 07/08/19 showed changes concerning for progressive peritoneal spread and increase in ascites - followed by Dr. Delton Coombes as outpt - f/u  CBC with differential after recent round of chemotherapy  BPH. - hold flomax while NPO  Goals of care. - apparently he is estranged from his children - chaplain assisting with identifying appropriate next of kin  D/w Dr. Manuella Ghazi  Best practice:  Diet: NPO DVT prophylaxis: lovenox GI prophylaxis: Protonix Mobility: bed rest Code Status: full code Disposition: ICU  Labs   CBC: Recent Labs  Lab 07/23/2019 1551 08/08/19 0904 08/10/19 0524  WBC 3.5* 2.9* 3.7*  NEUTROABS 2.4  --   --     HGB 12.4* 12.1* 12.0*  HCT 36.7* 38.0* 37.7*  MCV 93.4 98.2 97.9  PLT 150 133* 599    Basic Metabolic Panel: Recent Labs  Lab 07/22/2019 1551 08/07/19 0706 08/08/19 0904 08/10/19 0524 08/11/19 0437  NA 128* 132* 134* 134* 135  K 3.7 4.1 3.2* 3.4* 4.0  CL 91* 94* 95* 93* 94*  CO2 24 27 27 29 27   GLUCOSE 144* 115* 126* 131* 140*  BUN 10 10 8  6* 8  CREATININE 0.78 0.77 0.75 0.76 0.82  CALCIUM 8.6* 8.6* 9.0 9.3 9.7  MG  --   --   --  1.5* 2.0   GFR: Estimated Creatinine Clearance: 110 mL/min (by C-G formula based on SCr of 0.82 mg/dL). Recent Labs  Lab 07/14/2019 1551 07/22/2019 1904 08/08/19 0904 08/10/19 0524  WBC 3.5*  --  2.9* 3.7*  LATICACIDVEN  --  1.1  --   --     Liver Function Tests: Recent Labs  Lab 08/03/2019 1551  AST 13*  ALT 11  ALKPHOS 99  BILITOT 0.9  PROT 6.5  ALBUMIN 3.0*   No results for input(s): LIPASE, AMYLASE in the last 168 hours. Recent Labs  Lab 07/24/2019 1551  AMMONIA 22    ABG    Component Value Date/Time   PHART 7.47 (H) 02/12/2015 2330   PCO2ART 48 02/12/2015 2330   PO2ART 64 (L) 02/12/2015 2330   HCO3 34.9 (H) 02/12/2015 2330   O2SAT 93.4 02/12/2015 2330     Coagulation Profile: Recent Labs  Lab 07/14/2019 1551  INR 1.2    Cardiac Enzymes: No results for input(s): CKTOTAL, CKMB, CKMBINDEX, TROPONINI in the last 168 hours.  HbA1C: Hgb A1c MFr Bld  Date/Time Value Ref Range Status  02/04/2015 01:04 PM 5.0 4.0 - 6.0 % Final    CBG: Recent Labs  Lab 07/08/2019 1547  GLUCAP 141*    Review of Systems:   Unable to obtain  Past Medical History  He,  has a past medical history of Abnormal stress echocardiogram, Adenocarcinoma of transverse colon ([place]) (02/10/2015), Alcohol abuse, Anxiety, Blood transfusion without reported diagnosis, Cancer of overlapping sites of colon metastatic to intra-abdominal lymph node ([place]) (12/26/2017), COPD (chronic obstructive pulmonary disease) ([place]), Depression, Emphysema of lung ([place]), GERD  (gastroesophageal reflux disease), Head trauma (2001), Hypercholesterolemia, Hypertension, ING HERN W/GANGREN RECUR UNILAT/UNSPEC ING HERN (04/21/2009), PTSD (post-traumatic stress disorder), Pulmonary nodule (06/14/2016), SAH (subarachnoid hemorrhage) ([place]) (08/31/2012), and SDH (subdural hematoma) ([place]) (08/31/2012).   Surgical History    Past Surgical History:  Procedure Laterality Date  . BIOPSY  01/25/2016   Procedure: BIOPSY;  Surgeon: Danie Binder, MD;  [facility];  Service: Endoscopy;;  ileum;   . cardiac cath    . COLONOSCOPY  2011   Dr. Oneida Alar: multiple adenomas and hyperplastic polyps  . COLONOSCOPY WITH PROPOFOL N/A 01/12/2015   Procedure: COLONOSCOPY WITH PROPOFOL;  Surgeon: Danie Binder, MD;  [facility];  Service:  Endoscopy;  Laterality: N/A;  1030  . COLONOSCOPY WITH PROPOFOL N/A 01/25/2016   Procedure: COLONOSCOPY WITH PROPOFOL;  Surgeon: Danie Binder, MD;  [facility];  Service: Endoscopy;  Laterality: N/A;  1230  . CRANIOTOMY  2001  . ESOPHAGOGASTRODUODENOSCOPY (EGD) WITH PROPOFOL N/A 01/12/2015   Procedure: ESOPHAGOGASTRODUODENOSCOPY (EGD) WITH PROPOFOL;  Surgeon: Danie Binder, MD;  [facility];  Service: Endoscopy;  Laterality: N/A;  . head injury surgery    . HERNIA REPAIR Right 2012   Inguinal- [place] [facility]  . KIDNEY SURGERY     >30 years ago  . LAPAROSCOPIC RIGHT HEMI COLECTOMY Left 02/10/2015   Procedure: LAPAROSCOPIC THEN OPEN RIGHT HEMI COLECTOMY;  Surgeon: [name], MD;  Location: [facility];  Service: General;  Laterality: Left;  . LAPAROTOMY N/A 12/19/2017   Procedure: EXPLORATORY LAPAROTOMY;  Surgeon: Aviva Signs, MD;  Location: [facility];  Service: General;  Laterality: N/A;  . OPEN REDUCTION INTERNAL FIXATION (ORIF) DISTAL RADIAL FRACTURE Right 10/18/2018   Procedure: OPEN REDUCTION INTERNAL FIXATION (ORIF) RIGHT DISTAL RADIAL FRACTURE;  Surgeon: [name], MD;  Location: [facility];   Service: Orthopedics;  Laterality: Right;  block in preop  . OPEN REDUCTION INTERNAL FIXATION (ORIF) DISTAL RADIAL FRACTURE Right 12/20/2018   Procedure: OPEN REDUCTION INTERNAL FIXATION (ORIF) DISTAL RADIUS AND ULNA FRACTURE;  Surgeon: [name], MD;  Location: [facility];  Service: Orthopedics;  Laterality: Right;  block  . POLYPECTOMY  01/25/2016   Procedure: POLYPECTOMY;  Surgeon: Danie Binder, MD;  [facility];  Service: Endoscopy;;  colon  . PORT-A-CATH REMOVAL N/A 02/26/2017   Procedure: MINOR REMOVAL PORT-A-CATH;  Surgeon: Aviva Signs, MD;  [facility];  Service: General;  Laterality: N/A;  Pt to arrive at [facility] N/A 03/24/2015   Procedure: INSERTION PORT-A-CATH;  Surgeon: Jules Husbands, MD;  Location: [facility];  Service: General;  Laterality: N/A;  . PORTACATH PLACEMENT Left 01/07/2018   Procedure: INSERTION PORT A CATH (ATTACHED CATHETER IN LEFT SUBCLAVIAN);  Surgeon: Aviva Signs, MD;  Location: [facility];  Service: General;  Laterality: Left;     Social History   reports that he quit smoking about 20 months ago. His smoking use included cigarettes. He started smoking about 46 years ago. He has a 3.00 pack-year smoking history. He has never used smokeless tobacco. He reports current alcohol use. He reports that he does not use drugs.   Family History   His family history includes Arthritis in his mother; Breast cancer in his mother; Cancer in his mother and paternal grandfather; Cancer (age of onset: 13) in his father; Heart disease in his father; Pulmonary embolism in his mother. There is no history of Ataxia, Chorea, Dementia, Mental retardation, Migraines, Multiple sclerosis, Neurofibromatosis, Neuropathy, Parkinsonism, Seizures, Stroke, or Colon cancer.   Allergies No Known Allergies   Home Medications  Prior to Admission medications   Medication Sig Start Date End Date Taking? Authorizing Provider  amitriptyline  (ELAVIL) 25 MG tablet Take 1 tablet (25 mg total) by mouth at bedtime. 07/02/19  Yes Jaffe, Adam R, DO  atorvastatin (LIPITOR) 40 MG tablet Take 40 mg by mouth daily. 07/23/19  Yes [provider]  buPROPion (WELLBUTRIN SR) 150 MG 12 hr tablet Take 150 mg by mouth every morning.    Yes [provider]  fluorouracil CALGB 00938 in sodium chloride 0.9 % 150 mL Inject 5,650 mg into the vein. Over 46 hours  Yes [provider]  gabapentin (NEURONTIN) 300 MG capsule TAKE 1 CAPSULE BY MOUTH THREE TIMES A DAY 07/04/18  Yes Derek Jack, MD  IRINOTECAN HCL IV Inject 420 mg into the vein every 14 (fourteen) days.   Yes [provider]  LEUCOVORIN CALCIUM IV Inject 944 mg into the vein every 14 (fourteen) days.   Yes [provider]  lidocaine-prilocaine (EMLA) cream APPLY A SMALL AMOUNT OVER PORT SITE AND COVER WITH PLASTIC WRAP ONE HOUR PRIOR TO APPOINTMENT 11/04/18  Yes Derek Jack, MD  loperamide (IMODIUM) 2 MG capsule TAKE 2 CAPSULES BY MOUTH TWICE DAILY 04/29/19  Yes Derek Jack, MD  omeprazole (PRILOSEC) 20 MG capsule Take 20 mg by mouth every morning.    Yes [provider]  tamsulosin (FLOMAX) 0.4 MG CAPS capsule TAKE 1 CAPSULE (0.4 MG TOTAL) BY MOUTH DAILY. 07/21/19  Yes Derek Jack, MD  Vitamin D, Ergocalciferol, (DRISDOL) 1.25 MG (50000 UT) CAPS capsule Take 50,000 Units by mouth once a week. 12/05/18  Yes [provider]  XARELTO 20 MG TABS tablet TAKE 1 TABLET BY MOUTH EVERY DAY WITH SUPPER Patient taking differently: Take 20 mg by mouth daily with supper.  04/29/19  Yes Derek Jack, MD  busPIRone (BUSPAR) 10 MG tablet Take 10 mg by mouth 2 (two) times daily.    [provider]  cefdinir (OMNICEF) 300 MG capsule Take 1 capsule (300 mg total) by mouth 2 (two) times daily for 5 days. 08/08/19 08/13/19  Manuella Ghazi, Pratik D, DO  HYDROcodone-acetaminophen (NORCO) 5-325 MG tablet Take 1 tablet by mouth  every 6 (six) hours as needed for moderate pain. 08/01/19   Lockamy, Randi L, NP-C  nystatin (MYCOSTATIN/NYSTOP) powder Apply 1 application topically 4 (four) times daily. 03/26/19   Sherwood Gambler, MD  ondansetron (ZOFRAN) 8 MG tablet Take 1 tablet (8 mg total) by mouth 2 (two) times daily as needed for refractory nausea / vomiting. Start on day 3 after chemotherapy. Patient not taking: Reported on 07/23/2019 12/27/17   Higgs, Mathis Dad, MD  potassium chloride (KLOR-CON) 10 MEQ tablet Take 1 tablet (10 mEq total) by mouth daily for 7 days. 08/08/19 08/15/19  Manuella Ghazi, Pratik D, DO  PROAIR HFA 108 (90 Base) MCG/ACT inhaler INHALE 2 PUFFS BY MOUTH EVERY 6 HOURS AS NEEDED FOR SHORTNESS OF BREATH/WHEEZING. Patient not taking: Reported on 07/23/2019 02/13/17   Raylene Everts, MD  prochlorperazine (COMPAZINE) 10 MG tablet Take 1 tablet (10 mg total) by mouth every 6 (six) hours as needed (NAUSEA). Patient not taking: Reported on 07/23/2019 12/27/17   Zoila Shutter, MD     Critical care time: 42 minutes  Chesley Mires, MD [place] Pager - [phone] 08/12/2019, 2:35 PM

## 2019-08-12 NOTE — Progress Notes (Signed)
ANTICOAGULATION CONSULT NOTE  Pharmacy Consult for heparin Indication: PE  No Known Allergies  Patient Measurements: Height: 6\' 2"  (188 cm) Weight: 88.7 kg (195 lb 8.8 oz) IBW/kg (Calculated) : 82.2 Heparin Dosing Weight: 88 kg  Vital Signs: Temp: 100.9 F (38.3 C) (07/06 2145) Temp Source: Oral (07/06 1200) BP: 99/68 (07/06 2145) Pulse Rate: 110 (07/06 2145)  Labs: Recent Labs    08/10/19 0524 08/11/19 0437 08/12/19 2212  HGB 12.0*  --   --   HCT 37.7*  --   --   PLT 157  --   --   APTT  --   --  >200*  CREATININE 0.76 0.82  --     Estimated Creatinine Clearance: 110 mL/min (by C-G formula based on SCr of 0.82 mg/dL).   Assessment: 64 y.o. male with h/o PE, Xarelto on hold, for heparin.  Goal of Therapy:  Heparin level 0.3-0.7 units/ml aPTT 66-102 seconds Monitor platelets by anticoagulation protocol: Yes   Plan:  Hold heparin until 0200, then decrease heparin 1000 units/hr Check aPTT and heparin level in 8 hours.   Phillis Knack, PharmD, BCPS  08/12/2019 11:53 PM

## 2019-08-13 ENCOUNTER — Inpatient Hospital Stay (HOSPITAL_COMMUNITY): Payer: Medicaid Other

## 2019-08-13 DIAGNOSIS — C182 Malignant neoplasm of ascending colon: Secondary | ICD-10-CM

## 2019-08-13 DIAGNOSIS — R0902 Hypoxemia: Secondary | ICD-10-CM

## 2019-08-13 DIAGNOSIS — Z515 Encounter for palliative care: Secondary | ICD-10-CM

## 2019-08-13 DIAGNOSIS — N3000 Acute cystitis without hematuria: Principal | ICD-10-CM

## 2019-08-13 DIAGNOSIS — Z789 Other specified health status: Secondary | ICD-10-CM

## 2019-08-13 DIAGNOSIS — C189 Malignant neoplasm of colon, unspecified: Secondary | ICD-10-CM

## 2019-08-13 DIAGNOSIS — K838 Other specified diseases of biliary tract: Secondary | ICD-10-CM

## 2019-08-13 LAB — GLUCOSE, CAPILLARY
Glucose-Capillary: 101 mg/dL — ABNORMAL HIGH (ref 70–99)
Glucose-Capillary: 104 mg/dL — ABNORMAL HIGH (ref 70–99)
Glucose-Capillary: 115 mg/dL — ABNORMAL HIGH (ref 70–99)
Glucose-Capillary: 119 mg/dL — ABNORMAL HIGH (ref 70–99)
Glucose-Capillary: 120 mg/dL — ABNORMAL HIGH (ref 70–99)
Glucose-Capillary: 129 mg/dL — ABNORMAL HIGH (ref 70–99)
Glucose-Capillary: 99 mg/dL (ref 70–99)

## 2019-08-13 LAB — CBC WITH DIFFERENTIAL/PLATELET
Abs Immature Granulocytes: 0.28 10*3/uL — ABNORMAL HIGH (ref 0.00–0.07)
Basophils Absolute: 0.1 10*3/uL (ref 0.0–0.1)
Basophils Relative: 1 %
Eosinophils Absolute: 0 10*3/uL (ref 0.0–0.5)
Eosinophils Relative: 0 %
HCT: 41 % (ref 39.0–52.0)
Hemoglobin: 12.9 g/dL — ABNORMAL LOW (ref 13.0–17.0)
Immature Granulocytes: 2 %
Lymphocytes Relative: 7 %
Lymphs Abs: 1 10*3/uL (ref 0.7–4.0)
MCH: 30.9 pg (ref 26.0–34.0)
MCHC: 31.5 g/dL (ref 30.0–36.0)
MCV: 98.1 fL (ref 80.0–100.0)
Monocytes Absolute: 0.8 10*3/uL (ref 0.1–1.0)
Monocytes Relative: 6 %
Neutro Abs: 11 10*3/uL — ABNORMAL HIGH (ref 1.7–7.7)
Neutrophils Relative %: 84 %
Platelets: 199 10*3/uL (ref 150–400)
RBC: 4.18 MIL/uL — ABNORMAL LOW (ref 4.22–5.81)
RDW: 17.2 % — ABNORMAL HIGH (ref 11.5–15.5)
WBC: 13.2 10*3/uL — ABNORMAL HIGH (ref 4.0–10.5)
nRBC: 1.2 % — ABNORMAL HIGH (ref 0.0–0.2)

## 2019-08-13 LAB — COMPREHENSIVE METABOLIC PANEL
ALT: 101 U/L — ABNORMAL HIGH (ref 0–44)
AST: 56 U/L — ABNORMAL HIGH (ref 15–41)
Albumin: 2.8 g/dL — ABNORMAL LOW (ref 3.5–5.0)
Alkaline Phosphatase: 179 U/L — ABNORMAL HIGH (ref 38–126)
Anion gap: 17 — ABNORMAL HIGH (ref 5–15)
BUN: 25 mg/dL — ABNORMAL HIGH (ref 8–23)
CO2: 27 mmol/L (ref 22–32)
Calcium: 8.7 mg/dL — ABNORMAL LOW (ref 8.9–10.3)
Chloride: 91 mmol/L — ABNORMAL LOW (ref 98–111)
Creatinine, Ser: 1.73 mg/dL — ABNORMAL HIGH (ref 0.61–1.24)
GFR calc Af Amer: 48 mL/min — ABNORMAL LOW (ref >60)
GFR calc non Af Amer: 42 mL/min — ABNORMAL LOW (ref >60)
Glucose, Bld: 122 mg/dL — ABNORMAL HIGH (ref 70–99)
Potassium: 4.7 mmol/L (ref 3.5–5.1)
Sodium: 135 mmol/L (ref 135–145)
Total Bilirubin: 0.6 mg/dL (ref 0.3–1.2)
Total Protein: 6.7 g/dL (ref 6.5–8.1)

## 2019-08-13 LAB — TRIGLYCERIDES: Triglycerides: 166 mg/dL — ABNORMAL HIGH (ref <150)

## 2019-08-13 LAB — MAGNESIUM: Magnesium: 1.9 mg/dL (ref 1.7–2.4)

## 2019-08-13 LAB — PHOSPHORUS: Phosphorus: 4.5 mg/dL (ref 2.5–4.6)

## 2019-08-13 LAB — HEPARIN LEVEL (UNFRACTIONATED): Heparin Unfractionated: >2.2 IU/mL — ABNORMAL HIGH (ref 0.30–0.70)

## 2019-08-13 LAB — APTT
aPTT: >200 seconds (ref 24–36)
aPTT: 81 seconds — ABNORMAL HIGH (ref 24–36)

## 2019-08-13 MED ORDER — ARTIFICIAL TEARS OPHTHALMIC OINT
TOPICAL_OINTMENT | Freq: Two times a day (BID) | OPHTHALMIC | Status: DC
  Administered 2019-08-14: 1 via OPHTHALMIC
  Filled 2019-08-13 (×2): qty 3.5

## 2019-08-13 MED ORDER — HEPARIN (PORCINE) 25000 UT/250ML-% IV SOLN
700.0000 [IU]/h | INTRAVENOUS | Status: DC
  Filled 2019-08-13: qty 250

## 2019-08-13 NOTE — Progress Notes (Signed)
ANTICOAGULATION CONSULT NOTE -   Pharmacy Consult for heparin IV Indication: history of PE- currently NPO  No Known Allergies  Patient Measurements: Height: 6\' 2"  (188 cm) Weight: 87.9 kg (193 lb 12.6 oz) IBW/kg (Calculated) : 82.2 Heparin Dosing Weight: 88 kg  Vital Signs: Temp: 99.9 F (37.7 C) (07/07 2030) Temp Source: Bladder (07/07 1620) BP: 90/61 (07/07 2030) Pulse Rate: 103 (07/07 2030)  Labs: Recent Labs    08/11/19 0437 08/12/19 2212 08/13/19 0351 08/13/19 0942 08/13/19 2106  HGB  --   --  12.9*  --   --   HCT  --   --  41.0  --   --   PLT  --   --  199  --   --   APTT  --  >200*  --  >200* 81*  HEPARINUNFRC  --   --   --  >2.20*  --   CREATININE 0.82  --  1.73*  --   --     Estimated Creatinine Clearance: 52.1 mL/min (A) (by C-G formula based on SCr of 1.73 mg/dL (H)).    Assessment: Pharmacy consulted to dose heparin in patient with history of PE.  Patient is currently NPO and due for CT abd/pelvis.  Patient on Xarelto prior to initiation with last dose given 7/5 at 1530.  Will need to monitor based on aPTT until correlation with heparin levels.  PTT down to therapeutic (81 sec) on gtt at 700 units/hr. Heparin level still pending - expect to be elevated.  Goal of Therapy:  Heparin level 0.3-0.7 units/ml aPTT 66-102 seconds Monitor platelets by anticoagulation protocol: Yes   Plan:  Continue heparin infusion at 700 units/hr Will f/u daily PTT and heparin level  Sherlon Handing, PharmD, BCPS Please see amion for complete clinical pharmacist phone list 08/13/2019 10:38 PM

## 2019-08-13 NOTE — Consult Note (Addendum)
Consultation Note Date: 08/13/2019   Patient Name: Erik [name]  DOB: 1955-12-20  MRN: [id number]  Age / Sex: 64 y.o., male  PCP: [facility], [facility] Referring Physician: Barton Dubois, MD  Reason for Consultation: Establishing goals of care and Terminal Care  HPI/Patient Profile: 64 y.o. male  with past medical history of metastatic colon cancer (follows with Dr. Worthy Keeler), COPD, alcohol abuse, psoriasis, CAD, HTN, HLD, h/o SAH/SDH, PTSD admitted on 07/28/2019 from home as neighbor found him confused and in poor condition and self care and called EMS. Treating for UTI and Wernicke's encephalopathy and had acute aspiration event (with underlying concern for ileus) leading to cardiac/PEA arrest and now intubated in ICU. Overall prognosis is poor.   Clinical Assessment and Goals of Care: I met today at [facility] bedside. He does turn to me when I speak to him. He does not follow commands for me. His daughter, [name], is at bedside. I speak further with Megan regarding her father's poor prognosis. We discussed best case scenario he has progressing and terminal cancer. His mental status was declining even prior to aspiration and cardiac arrest. Unfortunately best case scenario is that he is able to exubate successfully but would still need care in a facility and still has terminal cancer. Worry about what his quality of life would be. Megan understands. She also admits that she has been surprised that he done as well as he has this long with his cancer. She would like to speak with her sister and consider her options. She confirms DNR and understands that his prognosis is very poor.   I received call from [facility] later and she tells me that she has spoken with her sister. Her sister is prepared for full comfort care and terminal extubation. [name] tells me that she would like to give him 1-2 days prior to extubation to  see if he can be optimized. She understands that this very well still likely to be a terminal event but is hoping he can at least improve enough to have some time to engage with her after extubation. She confirms that she does not wish to keep him intubated long but just another 1-2 days. She did not know about his decline until yesterday and needs some time to process as well.   All questions/concerns addressed. Emotional support provided. Updated RN, Dr. Dyann Kief, and Dr. Halford Chessman to plan and goals. I will continue to follow and continue discussions with [facility].   Primary Decision Maker NEXT OF KIN daughter [name]    SUMMARY OF RECOMMENDATIONS   - DNR - Extubation in the next 1-2 days - Continue to medically optimize over the next 1-2 days  Code Status/Advance Care Planning:  DNR   Symptom Management:   Per attending/PCCM.   Palliative Prophylaxis:   Aspiration, Bowel Regimen, Delirium Protocol, Frequent Pain Assessment, Oral Care and Turn Reposition  Psycho-social/Spiritual:   Desire for further Chaplaincy support:yes  Additional Recommendations: Compassionate Wean Education and Grief/Bereavement Support  Prognosis:   Unlikely to survive hospitalization.  Discharge Planning: To Be Determined      Primary Diagnoses: Present on Admission: . Acute metabolic encephalopathy   I have reviewed the medical record, interviewed the patient and family, and examined the patient. The following aspects are pertinent.  Past Medical History:  Diagnosis Date  . Abnormal stress echocardiogram   . Adenocarcinoma of transverse colon ([place]) 02/10/2015  . Alcohol abuse    Heavy Use up until 2010  . Anxiety   . Blood transfusion without reported diagnosis   . Cancer of overlapping sites of colon metastatic to intra-abdominal lymph node ([place]) 12/26/2017  . COPD (chronic obstructive pulmonary disease) ([place])   . Depression   . Emphysema of lung ([place])   . GERD (gastroesophageal reflux  disease)   . Head trauma 2001   closed head injury; coma for 4 weeks  . Hypercholesterolemia   . Hypertension   . ING HERN W/GANGREN RECUR UNILAT/UNSPEC ING HERN 04/21/2009   Qualifier: Diagnosis of  By: Verl Blalock, MD, Delanna Ahmadi PTSD (post-traumatic stress disorder)   . Pulmonary nodule 06/14/2016  . SAH (subarachnoid hemorrhage) ([place]) 08/31/2012  . SDH (subdural hematoma) ([place]) 08/31/2012   Social History   Socioeconomic History  . Marital status: Divorced    Spouse name: Not on file  . Number of children: 2  . Years of education: 76  . Highest education level: Not on file  Occupational History  . Occupation: disabled/unemployed  . Occupation: unemployed/Mediicaid only    Comment: NO INCOME  Tobacco Use  . Smoking status: Former Smoker    Packs/day: 0.10    Years: 30.00    Pack years: 3.00    Types: Cigarettes    Start date: 02/06/1973    Quit date: 12/09/2017    Years since quitting: 1.6  . Smokeless tobacco: Never Used  . Tobacco comment: Quit on 12/09/2017  Vaping Use  . Vaping Use: Never used  Substance and Sexual Activity  . Alcohol use: Yes    Alcohol/week: 0.0 standard drinks    Comment: beer occ, history of ETOH abuse in remote past.   . Drug use: No  . Sexual activity: Never    Partners: Female    Birth control/protection: None  Other Topics Concern  . Not on file  Social History Narrative   Single   Lives alone   Turned down for disability   Social Determinants of Health   Financial Resource Strain:   . Difficulty of Paying Living Expenses:   Food Insecurity:   . Worried About Charity fundraiser in the Last Year:   . Arboriculturist in the Last Year:   Transportation Needs:   . Film/video editor (Medical):   Marland Kitchen Lack of Transportation (Non-Medical):   Physical Activity:   . Days of Exercise per Week:   . Minutes of Exercise per Session:   Stress:   . Feeling of Stress :   Social Connections:   . Frequency of Communication with Friends and  Family:   . Frequency of Social Gatherings with Friends and Family:   . Attends Religious Services:   . Active Member of Clubs or Organizations:   . Attends Archivist Meetings:   Marland Kitchen Marital Status:    Family History  Problem Relation Age of Onset  . Pulmonary embolism Mother   . Breast cancer Mother   . Cancer Mother        breast cancer  . Arthritis Mother   .  Heart disease Father   . Cancer Father 27       Leukemia  . Cancer Paternal Grandfather        Lung  . Ataxia Neg Hx   . Chorea Neg Hx   . Dementia Neg Hx   . Mental retardation Neg Hx   . Migraines Neg Hx   . Multiple sclerosis Neg Hx   . Neurofibromatosis Neg Hx   . Neuropathy Neg Hx   . Parkinsonism Neg Hx   . Seizures Neg Hx   . Stroke Neg Hx   . Colon cancer Neg Hx    Scheduled Meds: . chlorhexidine gluconate (MEDLINE KIT)  15 mL Mouth Rinse BID  . Chlorhexidine Gluconate Cloth  6 each Topical Daily  . mouth rinse  15 mL Mouth Rinse 10 times per day  . pantoprazole (PROTONIX) IV  40 mg Intravenous Daily   Continuous Infusions: . sodium chloride 75 mL/hr at 08/13/19 0400  . heparin 1,000 Units/hr (08/13/19 0212)  . piperacillin-tazobactam (ZOSYN)  IV 3.375 g (08/13/19 0542)  . propofol (DIPRIVAN) infusion 20 mcg/kg/min (08/13/19 0545)  . thiamine injection Stopped (08/12/19 2140)   PRN Meds:.acetaminophen **OR** acetaminophen, fentaNYL (SUBLIMAZE) injection, levalbuterol, midazolam, [DISCONTINUED] ondansetron **OR** ondansetron (ZOFRAN) IV, polyethylene glycol No Known Allergies Review of Systems  Unable to perform ROS: Intubated    Physical Exam Vitals and nursing note reviewed.  Constitutional:      Interventions: He is intubated.  Cardiovascular:     Rate and Rhythm: Tachycardia present.  Pulmonary:     Effort: He is intubated.     Breath sounds: Rhonchi present.  Abdominal:     General: There is distension.     Palpations: Abdomen is soft.  Neurological:     Comments: Sedated on  vent     Vital Signs: BP 97/63   Pulse 100   Temp 99.3 F (37.4 C)   Resp (!) 24   Ht '6\' 2"'  (1.88 m)   Wt 87.9 kg   SpO2 93%   BMI 24.88 kg/m  Pain Scale: CPOT   Pain Score: 0-No pain   SpO2: SpO2: 93 % O2 Device:SpO2: 93 % O2 Flow Rate: .O2 Flow Rate (L/min): 4 L/min  IO: Intake/output summary:   Intake/Output Summary (Last 24 hours) at 08/13/2019 1010 Last data filed at 08/13/2019 0600 Gross per 24 hour  Intake 1304.93 ml  Output 2450 ml  Net -1145.07 ml    LBM: Last BM Date: 08/07/19 Baseline Weight: Weight: 90 kg Most recent weight: Weight: 87.9 kg     Palliative Assessment/Data:     Time In: 1150 Time Out: 1240 Time Total: 50 min Greater than 50%  of this time was spent counseling and coordinating care related to the above assessment and plan.  Signed by: Vinie Sill, NP Palliative Medicine Team Pager # [phone] (M-F 8a-5p) Team Phone # [phone] (Nights/Weekends)

## 2019-08-13 NOTE — Progress Notes (Signed)
ANTICOAGULATION CONSULT NOTE -   Pharmacy Consult for heparin IV Indication: history of PE- currently NPO  No Known Allergies  Patient Measurements: Height: 6\' 2"  (188 cm) Weight: 87.9 kg (193 lb 12.6 oz) IBW/kg (Calculated) : 82.2 Heparin Dosing Weight: 88 kg  Vital Signs: Temp: 99.7 F (37.6 C) (07/07 1130) Temp Source: Bladder (07/07 1125) BP: 94/68 (07/07 1130) Pulse Rate: 105 (07/07 1130)  Labs: Recent Labs    08/11/19 0437 08/12/19 2212 08/13/19 0351 08/13/19 0942  HGB  --   --  12.9*  --   HCT  --   --  41.0  --   PLT  --   --  199  --   APTT  --  >200*  --  >200*  HEPARINUNFRC  --   --   --  >2.20*  CREATININE 0.82  --  1.73*  --     Estimated Creatinine Clearance: 52.1 mL/min (A) (by C-G formula based on SCr of 1.73 mg/dL (H)).   Medical History: Past Medical History:  Diagnosis Date  . Abnormal stress echocardiogram   . Adenocarcinoma of transverse colon ([place]) 02/10/2015  . Alcohol abuse    Heavy Use up until 2010  . Anxiety   . Blood transfusion without reported diagnosis   . Cancer of overlapping sites of colon metastatic to intra-abdominal lymph node ([place]) 12/26/2017  . COPD (chronic obstructive pulmonary disease) ([place])   . Depression   . Emphysema of lung ([place])   . GERD (gastroesophageal reflux disease)   . Head trauma 2001   closed head injury; coma for 4 weeks  . Hypercholesterolemia   . Hypertension   . ING HERN W/GANGREN RECUR UNILAT/UNSPEC ING HERN 04/21/2009   Qualifier: Diagnosis of  By: Verl Blalock, MD, Delanna Ahmadi PTSD (post-traumatic stress disorder)   . Pulmonary nodule 06/14/2016  . SAH (subarachnoid hemorrhage) ([place]) 08/31/2012  . SDH (subdural hematoma) ([place]) 08/31/2012    Medications:  Medications Prior to Admission  Medication Sig Dispense Refill Last Dose  . amitriptyline (ELAVIL) 25 MG tablet Take 1 tablet (25 mg total) by mouth at bedtime. 90 tablet 0 Past Week at Unknown time  . atorvastatin (LIPITOR) 40 MG tablet Take 40  mg by mouth daily.   Past Week at Unknown time  . buPROPion (WELLBUTRIN SR) 150 MG 12 hr tablet Take 150 mg by mouth every morning.    Past Week at Unknown time  . fluorouracil CALGB 81191 in sodium chloride 0.9 % 150 mL Inject 5,650 mg into the vein. Over 46 hours   Past Week at Unknown time  . gabapentin (NEURONTIN) 300 MG capsule TAKE 1 CAPSULE BY MOUTH THREE TIMES A DAY 120 capsule 6 Past Week at Unknown time  . IRINOTECAN HCL IV Inject 420 mg into the vein every 14 (fourteen) days.     Marland Kitchen LEUCOVORIN CALCIUM IV Inject 944 mg into the vein every 14 (fourteen) days.     Marland Kitchen lidocaine-prilocaine (EMLA) cream APPLY A SMALL AMOUNT OVER PORT SITE AND COVER WITH PLASTIC WRAP ONE HOUR PRIOR TO APPOINTMENT 30 g 0 Past Month at Unknown time  . loperamide (IMODIUM) 2 MG capsule TAKE 2 CAPSULES BY MOUTH TWICE DAILY 60 capsule 2 Past Month at Unknown time  . omeprazole (PRILOSEC) 20 MG capsule Take 20 mg by mouth every morning.    08/04/2019 at Unknown time  . tamsulosin (FLOMAX) 0.4 MG CAPS capsule TAKE 1 CAPSULE (0.4 MG TOTAL) BY MOUTH DAILY. 30 capsule 6  08/04/2019 at Unknown time  . Vitamin D, Ergocalciferol, (DRISDOL) 1.25 MG (50000 UT) CAPS capsule Take 50,000 Units by mouth once a week.   Past Week at Unknown time  . XARELTO 20 MG TABS tablet TAKE 1 TABLET BY MOUTH EVERY DAY WITH SUPPER (Patient taking differently: Take 20 mg by mouth daily with supper. ) 30 tablet 6 08/04/2019 at Unknown time  . busPIRone (BUSPAR) 10 MG tablet Take 10 mg by mouth 2 (two) times daily.     Marland Kitchen HYDROcodone-acetaminophen (NORCO) 5-325 MG tablet Take 1 tablet by mouth every 6 (six) hours as needed for moderate pain. 60 tablet 0   . nystatin (MYCOSTATIN/NYSTOP) powder Apply 1 application topically 4 (four) times daily. 60 g 0   . ondansetron (ZOFRAN) 8 MG tablet Take 1 tablet (8 mg total) by mouth 2 (two) times daily as needed for refractory nausea / vomiting. Start on day 3 after chemotherapy. (Patient not taking: Reported on  07/23/2019) 30 tablet 1   . PROAIR HFA 108 (90 Base) MCG/ACT inhaler INHALE 2 PUFFS BY MOUTH EVERY 6 HOURS AS NEEDED FOR SHORTNESS OF BREATH/WHEEZING. (Patient not taking: Reported on 07/23/2019) 18 each 2   . prochlorperazine (COMPAZINE) 10 MG tablet Take 1 tablet (10 mg total) by mouth every 6 (six) hours as needed (NAUSEA). (Patient not taking: Reported on 07/23/2019) 30 tablet 1     Assessment: Pharmacy consulted to dose heparin in patient with history of PE.  Patient is currently NPO and due for CT abd/pelvis.  Patient is on Xarelto prior to initiation with last dose given 7/5 at 1530.  Will need to monitor based on aPTT until correlation with heparin levels.   APTT > 200  Goal of Therapy:  Heparin level 0.3-0.7 units/ml aPTT 66-102 seconds Monitor platelets by anticoagulation protocol: Yes   Plan:  Hold heparin for 1 hour. Decrease heparin infusion to 700 units/hr Check anti-Xa level in 6 hours and daily while on heparin. Continue to monitor H&H and platelets.   Margot Ables, PharmD Clinical Pharmacist 08/13/2019 12:30 PM

## 2019-08-13 NOTE — Plan of Care (Signed)
CRITICAL VALUE ALERT  Critical Value:  PTT >200  Date & Time Notied:  08/13/19 1230  Provider Notified: Dr. Dyann Kief  Orders Received/Actions taken: See new orders

## 2019-08-13 NOTE — Progress Notes (Signed)
PROGRESS NOTE    Erik [name]  EVO:[id number] DOB: 1955/06/25 DOA: 07/31/2019 PCP: Alanson Puls [facility]   Brief Narrative:  Per HPI: Erik [name] a 64 y.o.malewith medical history significant formetastatic lung cancer, COPD, alcohol abuse, psoriasis, coronary artery disease. Brought to the ED via EMS,retractions, patient's neighbor went to check on him and found him to be confused. Neighbors also reported that patient has no family, and is unable to take care of himself. At the time of my evaluation, patient is awakeandalert,answers a few questions appropriately, but otherwise he is unable to remember or is confused. He reports he has been having pain with urination over the past 4 weeks. He reports intermittent vomiting, intermittent loose stools, he has chronic abdominal pain.  He reports that he has been falling, he is unable to tell me what he has been eating.  Apparently in the ED there was dried stool on patient's legs.  ED Course:Temperature 97.6, heart rate 90s 200s, blood pressure systolic [id number]. O2 sats greater than 95% on room air. WBC 3.5. Sodium 128. Ammonia level 22. INR 1.2. UA shows positive nitrite and large leukocytes with few bacteria. Head CT shows old infarct without acute abnormality. Two-view chest x-ray without acute abnormality. Covid test negative. IV ceftriaxone started, 1 L bolus given. Hospitalist admit for altered mental status, UTI.  -Patient had been treated with Rocephin for total 5-day course for Proteus UTI which was likely causing acute metabolic encephalopathy and he did have improvement in overall alertness, but continued to remain confused and would have periods of tachycardia and anxiety.  This was suspected to be related to alcohol withdrawal and he was started on CIWA protocol with Ativan given as needed.  He continued to remain confused and therefore was thought to have Warnicke's encephalopathy and has been  started on high-dose IV thiamine.  Unfortunately on the morning of 7/6 he developed some aspiration pneumonia and started to become more hypoxemic.  He also had received Ativan earlier in the day per CIWA protocol.   He had a cardiac arrest and underwent CPR for a little under 10 minutes with ROSC.  He was intubated and brought to the ICU for further evaluation and management.  He was started on Zosyn.  He was seen by PCCM as well and CT of the chest as well as abdomen and pelvis were performed on account of significant amounts of feculent material noted on NG suctioning.  He is now noted to have significant ileus with signs of ischemic changes in his abdomen as well as portal venous gas and pneumobilia.  Case was reviewed with radiology as well as Dr. Constance Haw of general surgery with further aggressive care noted to be futile.    Discussions were had with his next of kin which is his daughter Erik [name] who understands that he has a grave prognosis and decision was made to keep him DNR.  Palliative care consulted with death likely eminent.  Assessment & Plan:   Active Problems:   Acute metabolic encephalopathy   Acute hypoxemic respiratory failure secondary to aspiration pneumonitis -Appreciate PCCM evaluation and management -Continue on ventilator support -Zosyn ordered for aspiration coverage -Sputum cultures ordered and pending  Status post PEA arrest 7/6 -Likely secondary to aspiration and related hypoxemia -With ROSC after approximately 10 minutes of CPR -Currently in ICU on ventilatory support -continue current antibiotics -follow rec's by palliative care -appreciate assistance by PCCM  Acute, persistent metabolic encephalopathy likely in relation to alcohol related causes -Noted  withdrawal as well as suspicion of Warnicke's encephalopathy -Continue on high-dose IV thiamine -Propofol for sedation provided  Proteus UTI -Status post treatment with Rocephin, completed -Foley catheter  placed while on ventilator support  Abdominal distention secondary to ileus with noted pneumobilia and portal venous gas and signs of ischemic change -Maintain NG tube to suction and keep n.p.o. -IV Zosyn for coverage -Discussed case with general surgery with intervention noted to be futile -Palliative care consultation appreciated -continue current care without escalation; patient is DNR. Most likely one way extubation in the next 24 hours or so.  History of PE -Xarelto changed to heparin drip after intubation -O2 sat stable with ventilatory support. -pharmacy to continue managing heparin level and dose.  History of metastatic colon cancer -Followed by Dr. Delton Coombes -was on chemotherapy PTA -Prior hx of right hemicolectomy  BPH -Holding Flomax -foley cathter in place currently  Chronic diarrhea  COPD -Currently intubated and mechanically ventilated -no wheezing   History of CAD  DVT prophylaxis: Heparin drip Code Status: DNR Family Communication: Discussed with daughter Erik [name]  Disposition Plan:   Status is: Inpatient  Dispo: The patient is from: Home              Anticipated d/c is to: will most likely expired in the hospital.              Anticipated d/c date is: to be determine              Patient currently not medically stable for discharge; very poor prognosis with anticipated hospital death.  Remains intubated, sedated, currently ventilated.  Continue IV antibiotics.  Follow-up for discussion with palliative care and family for advance directives. Reaching point of one way extubation.   Consultants:   PCCM  Discussed with general surgery  Palliative care  Procedures:   Intubation 7/6   Antimicrobials:  Anti-infectives (From admission, onward)   Start     Dose/Rate Route Frequency Ordered Stop   08/12/19 2200  piperacillin-tazobactam (ZOSYN) IVPB 3.375 g     Discontinue     3.375 g 12.5 mL/hr over 240 Minutes Intravenous Every 8 hours 08/12/19 1307      08/12/19 1330  piperacillin-tazobactam (ZOSYN) IVPB 3.375 g        3.375 g 100 mL/hr over 30 Minutes Intravenous  Once 08/12/19 1306 08/12/19 1420   08/08/19 0000  cefdinir (OMNICEF) 300 MG capsule     Discontinue     300 mg Oral 2 times daily 08/08/19 1121 08/13/19 2359   08/07/19 2000  cefTRIAXone (ROCEPHIN) 1 g in sodium chloride 0.9 % 100 mL IVPB        1 g 200 mL/hr over 30 Minutes Intravenous Every 24 hours 07/09/2019 2133 08/11/19 2100   07/13/2019 1900  cefTRIAXone (ROCEPHIN) 1 g in sodium chloride 0.9 % 100 mL IVPB        1 g 200 mL/hr over 30 Minutes Intravenous  Once 07/30/2019 1850 07/15/2019 2055      Subjective: Remains in the ICU; sedated, intubated and mechanically ventilated.  Patient spiking fever, elevated WBCs and worsening renal function appreciated.  Objective: Vitals:   08/13/19 1630 08/13/19 1700 08/13/19 1702 08/13/19 1703  BP: (!) 90/59   (!) 80/59  Pulse: (!) 102 (!) 102 100 (!) 102  Resp: (!) 21 (!) 24 (!) 24 (!) 21  Temp: 100 F (37.8 C) 100 F (37.8 C) 100 F (37.8 C) 100 F (37.8 C)  TempSrc:  SpO2: 95% 92% 90% 94%  Weight:      Height:        Intake/Output Summary (Last 24 hours) at 08/13/2019 1729 Last data filed at 08/13/2019 1633 Gross per 24 hour  Intake 1185.56 ml  Output 2160 ml  Net -974.44 ml   Filed Weights   08/01/2019 1503 07/19/2019 2145 08/13/19 0500  Weight: 90 kg 88.7 kg 87.9 kg    Examination: General exam: positive fever, sedated, intubated and mechanically ventilated.   Respiratory system: Positive scattered rhonchi; no wheezing or crackles appreciated. Cardiovascular system:Rate controlled, no rubs, no gallops, no murmurs.  No JVD appreciated on exam. Gastrointestinal system: Abdomen is distended, no bowel sounds. Central nervous system: Unable to properly assess secondary to sedation Extremities: No cyanosis or clubbing. Skin: No petechiae. Psychiatry: Unable to assess secondary to sedation.  Data Reviewed: I have  personally reviewed following labs and imaging studies  CBC: Recent Labs  Lab 08/08/19 0904 08/10/19 0524 08/13/19 0351  WBC 2.9* 3.7* 13.2*  NEUTROABS  --   --  11.0*  HGB 12.1* 12.0* 12.9*  HCT 38.0* 37.7* 41.0  MCV 98.2 97.9 98.1  PLT 133* 157 567   Basic Metabolic Panel: Recent Labs  Lab 08/07/19 0706 08/08/19 0904 08/10/19 0524 08/11/19 0437 08/13/19 0351  NA 132* 134* 134* 135 135  K 4.1 3.2* 3.4* 4.0 4.7  CL 94* 95* 93* 94* 91*  CO2 '27 27 29 27 27  ' GLUCOSE 115* 126* 131* 140* 122*  BUN 10 8 6* 8 25*  CREATININE 0.77 0.75 0.76 0.82 1.73*  CALCIUM 8.6* 9.0 9.3 9.7 8.7*  MG  --   --  1.5* 2.0 1.9  PHOS  --   --   --   --  4.5   GFR: Estimated Creatinine Clearance: 52.1 mL/min (A) (by C-G formula based on SCr of 1.73 mg/dL (H)).   Liver Function Tests: Recent Labs  Lab 08/13/19 0351  AST 56*  ALT 101*  ALKPHOS 179*  BILITOT 0.6  PROT 6.7  ALBUMIN 2.8*   CBG: Recent Labs  Lab 08/13/19 0017 08/13/19 0447 08/13/19 0729 08/13/19 1124 08/13/19 1617  GLUCAP 129* 115* 101* 120* 119*   Lipid Profile: Recent Labs    08/13/19 0351  TRIG 166*   Sepsis Labs: Recent Labs  Lab 07/17/2019 1904  LATICACIDVEN 1.1    Recent Results (from the past 240 hour(s))  SARS Coronavirus 2 by RT PCR (hospital order, performed in [facility] hospital lab) Nasopharyngeal Nasopharyngeal Swab     Status: None   Collection Time: 07/12/2019  5:34 PM   Specimen: Nasopharyngeal Swab  Result Value Ref Range Status   SARS Coronavirus 2 NEGATIVE NEGATIVE Final    Comment: (NOTE) SARS-CoV-2 target nucleic acids are NOT DETECTED.  The SARS-CoV-2 RNA is generally detectable in upper and lower respiratory specimens during the acute phase of infection. The lowest concentration of SARS-CoV-2 viral copies this assay can detect is 250 copies / mL. A negative result does not preclude SARS-CoV-2 infection and should not be used as the sole basis for treatment or other patient  management decisions.  A negative result may occur with improper specimen collection / handling, submission of specimen other than nasopharyngeal swab, presence of viral mutation(s) within the areas targeted by this assay, and inadequate number of viral copies (<250 copies / mL). A negative result must be combined with clinical observations, patient history, and epidemiological information.  Fact Sheet for Patients:   StrictlyIdeas.no  Fact Sheet  for Healthcare Providers: BankingDealers.co.za  This test is not yet approved or  cleared by the [place] and has been authorized for detection and/or diagnosis of SARS-CoV-2 by FDA under an Emergency Use Authorization (EUA).  This EUA will remain in effect (meaning this test can be used) for the duration of the COVID-19 declaration under Section 564(b)(1) of the Act, 21 U.S.C. section 360bbb-3(b)(1), unless the authorization is terminated or revoked sooner.  Performed at [facility], [address] [place], [place] [address]   Urine culture     Status: Abnormal   Collection Time: 07/11/2019  5:49 PM   Specimen: Urine, Random  Result Value Ref Range Status   Specimen Description   Final    URINE, RANDOM Performed at [facility], [address], [place], [place] [address]    Special Requests   Final    NONE Performed at [facility], [address], [place], [place] [address]    Culture >=100,000 COLONIES/mL PROTEUS MIRABILIS (A)  Final   Report Status 08/09/2019 FINAL  Final   Organism ID, Bacteria PROTEUS MIRABILIS (A)  Final      Susceptibility   Proteus mirabilis - MIC*    AMPICILLIN <=2 SENSITIVE Sensitive     CEFAZOLIN <=4 SENSITIVE Sensitive     CEFTRIAXONE <=0.25 SENSITIVE Sensitive     CIPROFLOXACIN <=0.25 SENSITIVE Sensitive     GENTAMICIN <=1 SENSITIVE Sensitive     IMIPENEM 2 SENSITIVE Sensitive     NITROFURANTOIN 128 RESISTANT Resistant     TRIMETH/SULFA <=20  SENSITIVE Sensitive     AMPICILLIN/SULBACTAM <=2 SENSITIVE Sensitive     PIP/TAZO <=4 SENSITIVE Sensitive     * >=100,000 COLONIES/mL PROTEUS MIRABILIS  Culture, blood (routine x 2)     Status: None   Collection Time: 07/31/2019  7:04 PM   Specimen: BLOOD RIGHT FOREARM  Result Value Ref Range Status   Specimen Description BLOOD RIGHT FOREARM  Final   Special Requests   Final    BOTTLES DRAWN AEROBIC AND ANAEROBIC Blood Culture adequate volume   Culture   Final    NO GROWTH 5 DAYS Performed at [facility], [address] [place], [place] [address]    Report Status 08/11/2019 FINAL  Final  Culture, blood (routine x 2)     Status: None   Collection Time: 07/15/2019  7:15 PM   Specimen: BLOOD LEFT HAND  Result Value Ref Range Status   Specimen Description BLOOD LEFT HAND  Final   Special Requests   Final    BOTTLES DRAWN AEROBIC AND ANAEROBIC Blood Culture adequate volume   Culture   Final    NO GROWTH 5 DAYS Performed at [facility], [address], [place], [place] [address]    Report Status 08/11/2019 FINAL  Final  Culture, respiratory (non-expectorated)     Status: None (Preliminary result)   Collection Time: 08/12/19  1:58 PM   Specimen: Endotracheal; Respiratory  Result Value Ref Range Status   Specimen Description   Final    ENDOTRACHEAL Performed at [facility], [address] [place], [place] [address]    Special Requests   Final    NONE Performed at [facility], [address] [place], [place] [address]    Gram Stain   Final    NO WBC SEEN ABUNDANT YEAST MODERATE GRAM POSITIVE RODS FEW GRAM NEGATIVE RODS    Culture   Final    TOO YOUNG TO READ Performed at [facility], [place]  [address] [place], [place] [address]    Report Status PENDING  Incomplete     Radiology Studies: DG Chest 1 View  Result Date: 08/12/2019 CLINICAL DATA:  Hypoxemia, history of colon cancer, COPD, emphysema EXAM: CHEST  1 VIEW COMPARISON:  07/20/2019 FINDINGS: The heart size and  mediastinal contours are within normal limits. Left chest port catheter. New heterogeneous airspace opacity of the left lung base. The visualized skeletal structures are unremarkable. IMPRESSION: New heterogeneous airspace opacity of the left lung base, concerning for infection or aspiration. Electronically Signed   By: Eddie Candle M.D.   On: 08/12/2019 12:31   CT CHEST W CONTRAST  Result Date: 08/12/2019 CLINICAL DATA:  Cardiac arrest prior to presentation, found with altered mental status by neighbor, UTI with antibiotic treatment, hypoxia, hypertension, GERD, emphysema, metastatic colon cancer, prior pulmonary embolism EXAM: CT CHEST, ABDOMEN, AND PELVIS WITH CONTRAST TECHNIQUE: Multidetector CT imaging of the chest, abdomen and pelvis was performed following the standard protocol during bolus administration of intravenous contrast. Sagittal and coronal MPR images reconstructed from axial data set. CONTRAST:  132m OMNIPAQUE IOHEXOL 300 MG/ML SOLN IV. No oral contrast administered. COMPARISON:  07/08/2019 FINDINGS: CT CHEST FINDINGS Cardiovascular: Atherosclerotic calcifications of the coronary arteries. Aorta normal caliber. Vascular structures patent on non targeted exam. No pericardial effusion or enlargement of cardiac chambers. Mediastinum/Nodes: Nasogastric tube traverses esophagus into stomach. Tip of endotracheal tube above carina. Base of cervical region normal appearance. No thoracic adenopathy. Lungs/Pleura: LEFT lower lobe consolidation. Additional mild infiltrate in lingula and in RIGHT lower lobe. Central peribronchial thickening. Mild emphysematous changes in upper lobes. No additional infiltrate, pleural effusion or pneumothorax. Musculoskeletal: Osseous structures unremarkable CT ABDOMEN PELVIS FINDINGS Hepatobiliary: Linear foci of gas within the anterior aspect of the LEFT lobe of the liver consistent with portal venous gas. No definite hepatic mass lesion. Gallbladder unremarkable.  Pancreas: Unremarkable Spleen: Normal appearance. Tiny splenule anterior inferior to spleen Adrenals/Urinary Tract: Adrenal glands, kidneys, and ureters normal appearance. Bladder decompressed by Foley catheter, unable to exclude bladder wall thickening though this could be an artifact from underdistention Stomach/Bowel: Assessment of bowel limited secondary to motion. Diffuse dilatation of small bowel loops and stomach. Prior proximal colonic resection with residual rectum and sigmoid colon in pelvis. Dilated small bowel loops extend to rectosigmoid anastomosis, in light of other findings favor ileus. Pneumatosis identified within small bowel loops in the mid abdomen. Findings are highly suspicious for small bowel ischemia with ileus, pneumatosis and portal venous gas. Vascular/Lymphatic: Thoracic vascular structures patent. Aorta normal caliber. No definite thrombus identified within portal or superior mesenteric veins, nor within celiac or superior mesenteric arteries. Reproductive: Calcifications within prostate gland. Seminal vesicles unremarkable. Other: No free air or free fluid. No hernia. Thickening/nodularity along the laparotomy scar in the anterior abdominal wall unchanged. Musculoskeletal: Severe motion artifacts degrade assessment in the pelvis. Probable LEFT hip joint effusion. IMPRESSION: Diffusely dilated small bowel loops with small bowel pneumatosis and portal venous gas within the liver highly suspicious for small bowel ischemia with associated ileus. No thrombus is identified within major vascular structures. Potentially ischemia could be on the basis of prior cardiac arrest and resuscitation. No evidence of hernia or perforation. LEFT lower lobe consolidation. Probable LEFT hip joint effusion. Aortic Atherosclerosis (ICD10-I70.0) and Emphysema (ICD10-J43.9). Critical Value/emergent results were called by telephone at the time of interpretation on 08/12/2019 at 1545 hours to provider DR. PRATIK  [facility], who verbally acknowledged these results. Electronically Signed   By: MCrist InfanteD.  On: 08/12/2019 15:49   CT ABDOMEN PELVIS W CONTRAST  Result Date: 08/12/2019 CLINICAL DATA:  Cardiac arrest prior to presentation, found with altered mental status by neighbor, UTI with antibiotic treatment, hypoxia, hypertension, GERD, emphysema, metastatic colon cancer, prior pulmonary embolism EXAM: CT CHEST, ABDOMEN, AND PELVIS WITH CONTRAST TECHNIQUE: Multidetector CT imaging of the chest, abdomen and pelvis was performed following the standard protocol during bolus administration of intravenous contrast. Sagittal and coronal MPR images reconstructed from axial data set. CONTRAST:  125m OMNIPAQUE IOHEXOL 300 MG/ML SOLN IV. No oral contrast administered. COMPARISON:  07/08/2019 FINDINGS: CT CHEST FINDINGS Cardiovascular: Atherosclerotic calcifications of the coronary arteries. Aorta normal caliber. Vascular structures patent on non targeted exam. No pericardial effusion or enlargement of cardiac chambers. Mediastinum/Nodes: Nasogastric tube traverses esophagus into stomach. Tip of endotracheal tube above carina. Base of cervical region normal appearance. No thoracic adenopathy. Lungs/Pleura: LEFT lower lobe consolidation. Additional mild infiltrate in lingula and in RIGHT lower lobe. Central peribronchial thickening. Mild emphysematous changes in upper lobes. No additional infiltrate, pleural effusion or pneumothorax. Musculoskeletal: Osseous structures unremarkable CT ABDOMEN PELVIS FINDINGS Hepatobiliary: Linear foci of gas within the anterior aspect of the LEFT lobe of the liver consistent with portal venous gas. No definite hepatic mass lesion. Gallbladder unremarkable. Pancreas: Unremarkable Spleen: Normal appearance. Tiny splenule anterior inferior to spleen Adrenals/Urinary Tract: Adrenal glands, kidneys, and ureters normal appearance. Bladder decompressed by Foley catheter, unable to exclude bladder wall  thickening though this could be an artifact from underdistention Stomach/Bowel: Assessment of bowel limited secondary to motion. Diffuse dilatation of small bowel loops and stomach. Prior proximal colonic resection with residual rectum and sigmoid colon in pelvis. Dilated small bowel loops extend to rectosigmoid anastomosis, in light of other findings favor ileus. Pneumatosis identified within small bowel loops in the mid abdomen. Findings are highly suspicious for small bowel ischemia with ileus, pneumatosis and portal venous gas. Vascular/Lymphatic: Thoracic vascular structures patent. Aorta normal caliber. No definite thrombus identified within portal or superior mesenteric veins, nor within celiac or superior mesenteric arteries. Reproductive: Calcifications within prostate gland. Seminal vesicles unremarkable. Other: No free air or free fluid. No hernia. Thickening/nodularity along the laparotomy scar in the anterior abdominal wall unchanged. Musculoskeletal: Severe motion artifacts degrade assessment in the pelvis. Probable LEFT hip joint effusion. IMPRESSION: Diffusely dilated small bowel loops with small bowel pneumatosis and portal venous gas within the liver highly suspicious for small bowel ischemia with associated ileus. No thrombus is identified within major vascular structures. Potentially ischemia could be on the basis of prior cardiac arrest and resuscitation. No evidence of hernia or perforation. LEFT lower lobe consolidation. Probable LEFT hip joint effusion. Aortic Atherosclerosis (ICD10-I70.0) and Emphysema (ICD10-J43.9). Critical Value/emergent results were called by telephone at the time of interpretation on 08/12/2019 at 1545 hours to provider DR. [facility], who verbally acknowledged these results. Electronically Signed   By: MLavonia DanaM.D.   On: 08/12/2019 15:49   DG Chest Port 1 View  Result Date: 08/13/2019 CLINICAL DATA:  Respiratory failure EXAM: PORTABLE CHEST 1 VIEW COMPARISON:   08/12/2019 FINDINGS: Endotracheal tube tip is at the clavicular heads. Enteric tube with tip reaching the stomach. Left port with tip at the SVC. Worsening inflation and increased streaky density at the bases. No edema, effusion, or pneumothorax. Normal heart size. IMPRESSION: 1. Stable hardware positioning. 2. Worsening inflation and infiltrates. Electronically Signed   By: JMonte FantasiaM.D.   On: 08/13/2019 06:24   DG Chest Port 1V same Day  Result Date: 08/12/2019 CLINICAL DATA:  Intubation.  OG tube placement. EXAM: PORTABLE CHEST 1 VIEW COMPARISON:  08/12/2019. FINDINGS: Tracheal tube noted with tip 4 cm above the carina. NG tube noted with tip below left hemidiaphragm. PowerPort catheter noted in stable position. Heart size normal. Low lung volumes with bibasilar atelectasis/infiltrates again noted without interim change. No prominent pleural effusion. Right costophrenic angle incompletely imaged. IMPRESSION: 1. Endotracheal tube and NG tube in good anatomic position. PowerPort catheter stable position. 2. Low lung volumes with bibasilar atelectasis/infiltrates again noted without interval change. Tiny left pleural effusion cannot be excluded. Electronically Signed   By: Marcello Moores  Register   On: 08/12/2019 13:49   DG Abd Portable 1V  Result Date: 08/13/2019 CLINICAL DATA:  Respiratory failure and ileus. Code blue earlier today with intubation. Metastatic colon cancer. EXAM: PORTABLE ABDOMEN - 1 VIEW COMPARISON:  CT abdomen from 08/12/2019 FINDINGS: Is gastric tube tip is in the stomach body. Suspected left retro diaphragmatic airspace opacity. Dilated loops of small bowel up to about 6.5 cm throughout the abdomen. The pneumatosis shown on CT scan yesterday involving the small bowel is not highly apparent on radiography, but sensitivity is expected to be much higher on CT and this lack of visualization on today's radiograph does not ensure resolution of the prior small bowel pneumatosis. I not see  obvious signs of free intraperitoneal gas, with the understanding that today supine abdominal radiograph has a lower sensitivity compared to decubitus projections. Contrast medium observed in the urinary bladder. IMPRESSION: 1. Continued dilated loops of small bowel, up to 6.5 cm. 2. Left left lower lobe airspace opacity. 3. Nasogastric tube tip: Stomach body. Electronically Signed   By: Van Clines M.D.   On: 08/13/2019 06:37    Scheduled Meds: . artificial tears   Both Eyes BID  . chlorhexidine gluconate (MEDLINE KIT)  15 mL Mouth Rinse BID  . Chlorhexidine Gluconate Cloth  6 each Topical Daily  . mouth rinse  15 mL Mouth Rinse 10 times per day  . pantoprazole (PROTONIX) IV  40 mg Intravenous Daily   Continuous Infusions: . sodium chloride 100 mL/hr at 08/13/19 1446  . heparin 700 Units/hr (08/13/19 1412)  . piperacillin-tazobactam (ZOSYN)  IV 3.375 g (08/13/19 1320)  . propofol (DIPRIVAN) infusion 20 mcg/kg/min (08/13/19 1704)     LOS: 7 days    Critical care time: 35 minutes    Barton Dubois, MD [facility]  If 7PM-7AM, please contact night-coverage [email or web address] 08/13/2019, 5:29 PM

## 2019-08-13 NOTE — Progress Notes (Signed)
NAME:  Erik [name], MRN:  [id number], DOB:  [birth year], LOS: 7 ADMISSION DATE:  08/04/2019, CONSULTATION DATE:  08/12/2019 REFERRING MD:  Dr. Manuella Ghazi, [facility], CHIEF COMPLAINT:  Respiratory failure   Brief History   64 yo male former smoker found by his neighbor to be confused and smelling of urine and dried stool on his legs.  EMS called and he was brought to ER.  Found to have UTI, hyponatremia and started on antibiotics and IV fluids.  Developed worsening confusion and started on CIWA protocol.  Concern for aspiration event and progressive hypoxia on 08/12/19.  This lead to respiratory and then cardiac arrest with about 6 to 10 minutes for ROSC.  PCCM consulted to assist with ICU care.  Past Medical History  COPD, ETOH, Metastatic colon cancer, Psoriasis, CAD, Pulmonary embolism, Anxiety, Depression, GERD, HLD, HTN, PTSD, SAH/SDH  Significant Hospital Events   6/30 Admit 7/04 start CIWA protocol 7/06 respiratory arrest, VDRF 7/07 DNR  Consults:    Procedures:  Lt port >> ETT 7/06 >>   Significant Diagnostic Tests:   CT head 7/02 >> chronic encephalomalacia Rt frontal and temporal lobes, atrophy  CT chest 7/06 >> atherosclerosis, LLL consolidation, mild emphysema  CT abd/pelvis 7/06 >> diffuse dilation of SB and stomach concerning for SB ischemia with ileus  Micro Data:  Urine 6/30 >> Proteus, resistant to nitrofurantoin Blood 6/30 >> negative Sputum 7/06 >>   Antimicrobials:  Rocephin 6/30 >> 7/05 Zosyn 7/06 >>   Interim history/subjective:  Remains on sedation, vent support.  Objective   Blood pressure 94/68, pulse (!) 105, temperature 99.7 F (37.6 C), resp. rate (!) 24, height 6\' 2"  (1.88 m), weight 87.9 kg, SpO2 92 %.    Vent Mode: PRVC FiO2 (%):  [35 %-100 %] 35 % Set Rate:  [16 bmp-24 bmp] 24 bmp Vt Set:  [640 mL] 640 mL PEEP:  [5 cmH20] 5 cmH20 Pressure Support:  [10 cmH20] 10 cmH20 Plateau Pressure:  [14 cmH20-20 cmH20] 19 cmH20   Intake/Output Summary  (Last 24 hours) at 08/13/2019 1342 Last data filed at 08/13/2019 0800 Gross per 24 hour  Intake 1304.93 ml  Output 2625 ml  Net -1320.07 ml   Filed Weights   07/11/2019 1503 07/24/2019 2145 08/13/19 0500  Weight: 90 kg 88.7 kg 87.9 kg    Examination:  General - sedated Eyes - pupils midpoint ENT - ETT in place Cardiac - regular, tachycardic Chest - b/l rhonchi Abdomen - OG tube to suction with brown fluid, decreased bowel sounds, less distended Extremities - 1+ edema Skin - no rashes Neuro- RASS -3  Resolved Hospital Problem list   Proteus UTI, Hyponatremia  Assessment & Plan:   Acute hypoxic respiratory failure likely 2nd to aspiration pneumonitis. Hx of COPD. - full vent support for now - day 2 of zosyn - prn BDs  Acute metabolic encephalopathy 2nd to alcohol withdrawal and hypoxia. Hx of anxiety, depression. - continue diprivan with prn versed, fentanyl  Abdominal distention concerning for small bowel ischemia and ileus. - not surgical candidate given poor prognosis - continue OG to suction  Hx of PE. - incidental finding on CT chest from April 2020 - currently on heparin gtt in place of xarelto  Hx of metastatic colon cancer. - diagnosed December 2016, Rt hemicolectomy January 2017 and then multiple rounds of chemotherapy; most recently on FOLFIRI and bevacizumab (infused end of June 2021) - CT imaging from 07/08/19 showed changes concerning for progressive peritoneal spread and increase in  ascites - followed by Dr. Delton Coombes as outpt  BPH. - hold flomax while NPO  Goals of care. - now DNR - family gathering to discuss goals of care >> likely transition to comfort measures  Best practice:  Diet: NPO DVT prophylaxis: heparin gtt GI prophylaxis: Protonix Mobility: bed rest Code Status: full code Disposition: ICU  Labs    CMP Latest Ref Rng & Units 08/13/2019 08/11/2019 08/10/2019  Glucose 70 - 99 mg/dL 122(H) 140(H) 131(H)  BUN 8 - 23 mg/dL 25(H) 8 6(L)    Creatinine 0.61 - 1.24 mg/dL 1.73(H) 0.82 0.76  Sodium 135 - 145 mmol/L 135 135 134(L)  Potassium 3.5 - 5.1 mmol/L 4.7 4.0 3.4(L)  Chloride 98 - 111 mmol/L 91(L) 94(L) 93(L)  CO2 22 - 32 mmol/L 27 27 29   Calcium 8.9 - 10.3 mg/dL 8.7(L) 9.7 9.3  Total Protein 6.5 - 8.1 g/dL 6.7 - -  Total Bilirubin 0.3 - 1.2 mg/dL 0.6 - -  Alkaline Phos 38 - 126 U/L 179(H) - -  AST 15 - 41 U/L 56(H) - -  ALT 0 - 44 U/L 101(H) - -    CBC Latest Ref Rng & Units 08/13/2019 08/10/2019 08/08/2019  WBC 4.0 - 10.5 K/uL 13.2(H) 3.7(L) 2.9(L)  Hemoglobin 13.0 - 17.0 g/dL 12.9(L) 12.0(L) 12.1(L)  Hematocrit 39 - 52 % 41.0 37.7(L) 38.0(L)  Platelets 150 - 400 K/uL 199 157 133(L)    ABG    Component Value Date/Time   PHART 7.376 08/12/2019 1435   PCO2ART 47.6 08/12/2019 1435   PO2ART 79.8 (L) 08/12/2019 1435   HCO3 26.1 08/12/2019 1435   O2SAT 93.0 08/12/2019 1435    CBG (last 3)  Recent Labs    08/13/19 0447 08/13/19 0729 08/13/19 1124  GLUCAP 115* 101* 120*    Signature:  Chesley Mires, MD [place] Pager - [phone] 08/13/2019, 1:42 PM

## 2019-08-13 NOTE — Progress Notes (Signed)
Pt returned to full support due to increased HR, RR and agitation. O2 saturation dropped into the 80's. Pt has stable VS on full support

## 2019-08-14 ENCOUNTER — Inpatient Hospital Stay (HOSPITAL_COMMUNITY): Payer: Medicaid Other

## 2019-08-14 DIAGNOSIS — Z7189 Other specified counseling: Secondary | ICD-10-CM

## 2019-08-14 DIAGNOSIS — Z515 Encounter for palliative care: Secondary | ICD-10-CM

## 2019-08-14 DIAGNOSIS — I469 Cardiac arrest, cause unspecified: Secondary | ICD-10-CM

## 2019-08-14 LAB — CULTURE, RESPIRATORY W GRAM STAIN: Gram Stain: NONE SEEN

## 2019-08-14 LAB — GLUCOSE, CAPILLARY
Glucose-Capillary: 98 mg/dL (ref 70–99)
Glucose-Capillary: 110 mg/dL — ABNORMAL HIGH (ref 70–99)
Glucose-Capillary: 97 mg/dL (ref 70–99)

## 2019-08-14 LAB — TRIGLYCERIDES: Triglycerides: 317 mg/dL — ABNORMAL HIGH (ref <150)

## 2019-08-14 LAB — HEPARIN LEVEL (UNFRACTIONATED): Heparin Unfractionated: 1.06 IU/mL — ABNORMAL HIGH (ref 0.30–0.70)

## 2019-08-14 LAB — APTT: aPTT: 71 seconds — ABNORMAL HIGH (ref 24–36)

## 2019-08-14 MED ORDER — BIOTENE DRY MOUTH MT LIQD: 15.0000 mL | OROMUCOSAL | Status: DC | PRN

## 2019-08-14 MED ORDER — POLYVINYL ALCOHOL 1.4 % OP SOLN: 1.0000 [drp] | Freq: Four times a day (QID) | OPHTHALMIC | Status: DC | PRN

## 2019-08-14 MED ORDER — GLYCOPYRROLATE 0.2 MG/ML IJ SOLN
0.4000 mg | INTRAMUSCULAR | Status: DC
  Administered 2019-08-14 (×3): 0.4 mg via INTRAVENOUS
  Filled 2019-08-14 (×3): qty 2

## 2019-08-14 MED ORDER — HYDROMORPHONE BOLUS VIA INFUSION
0.5000 mg | INTRAVENOUS | Status: DC | PRN
  Filled 2019-08-14: qty 1

## 2019-08-14 MED ORDER — LORAZEPAM 2 MG/ML IJ SOLN
2.0000 mg | INTRAMUSCULAR | Status: DC | PRN
  Administered 2019-08-14: 2 mg via INTRAVENOUS
  Filled 2019-08-14: qty 1

## 2019-08-14 MED ORDER — SODIUM CHLORIDE 0.9 % IV SOLN
0.5000 mg/h | INTRAVENOUS | Status: DC
  Administered 2019-08-14: 0.5 mg/h via INTRAVENOUS
  Filled 2019-08-14 (×2): qty 2.5

## 2019-08-15 DIAGNOSIS — N179 Acute kidney failure, unspecified: Secondary | ICD-10-CM

## 2019-08-15 DIAGNOSIS — K567 Ileus, unspecified: Secondary | ICD-10-CM

## 2019-08-15 DIAGNOSIS — Z7189 Other specified counseling: Secondary | ICD-10-CM

## 2019-08-15 NOTE — Discharge Summary (Signed)
Death Summary  Erik [name] HFW:[id number] DOB: 14-Feb-1955 DOA: 08/09/2019  PCP: Erik [name] [facility] PCP/Office notified: notified through [place].  Admit date: 08/09/2019 Date of Death: 08-17-19  Final Diagnoses:  Active Problems:   Acute metabolic encephalopathy   Cardiac arrest (HCC)   Goals of care, counseling/discussion   Palliative care by specialist Sedation pneumonitis Proteus UTI Abdominal ileus, perforated viscus History of metastatic colon cancer Alcohol abuse Acute kidney injury History of PE BPH COPD Chronic diarrhea History of coronary artery disease   History of present illness and brief summary:  Per HPI: Erik [name] a 64 y.o.malewith medical history significant formetastatic lung cancer, COPD, alcohol abuse, psoriasis, coronary artery disease. Brought to the ED via EMS,retractions, patient's neighbor went to check on him and found him to be confused. Neighbors also reported that patient has no family, and is unable to take care of himself. At the time of my evaluation, patient is awakeandalert,answers a few questions appropriately, but otherwise he is unable to remember or is confused. He reports he has been having pain with urination over the past 4 weeks. He reports intermittent vomiting, intermittent loose stools, he has chronic abdominal pain.  He reports that he has been falling, he is unable to tell me what he has been eating.  Apparently in the ED there was dried stool on patient's legs.  ED Course:Temperature 97.6, heart rate 90s 200s, blood pressure systolic 027-741O. O2 sats greater than 95% on room air. WBC 3.5. Sodium 128. Ammonia level 22. INR 1.2. UA shows positive nitrite and large leukocytes with few bacteria. Head CT shows old infarct without acute abnormality. Two-view chest x-ray without acute abnormality. Covid test negative. IV ceftriaxone started, 1 L bolus given. Hospitalist admit for altered mental status,  UTI.  -Patient had been treated with Rocephin for total 5-day course for Proteus UTI which was likely causing acute metabolic encephalopathy and he did have improvement in overall alertness, but continued to remain confused and would have periods of tachycardia and anxiety.  This was suspected to be related to alcohol withdrawal and he was started on CIWA protocol with Ativan given as needed.  He continued to remain confused and therefore was thought to have Warnicke's encephalopathy and has been started on high-dose IV thiamine.  Unfortunately on the morning of 7/6 he developed some aspiration pneumonia and started to become more hypoxemic.  He also had received Ativan earlier in the day per CIWA protocol.   He had a cardiac arrest and underwent CPR for a little under 10 minutes with ROSC.  He was intubated and brought to the ICU for further evaluation and management.  He was started on Zosyn.  He was seen by PCCM as well and CT of the chest as well as abdomen and pelvis were performed on account of significant amounts of feculent material noted on NG suctioning.  He is now noted to have significant ileus with signs of ischemic changes in his abdomen as well as portal venous gas and pneumobilia.  Case was reviewed with radiology as well as Erik [name] of general surgery with further aggressive care noted to be futile.    Discussions were had with his next of kin which is his daughter Erik [name] who understands that he has a grave prognosis and decision was made to keep him DNR.  Palliative care consulted with death likely eminent.  Plan decided was for one-way installation later today with full compression to comfort care at the moment.  Hospital Course:  Acute hypoxemic respiratory failure secondary to aspiration pneumonitis -Appreciate PCCM evaluation and management -Zosyn ordered for aspiration coverage -Sputum cultures ordered and pending  Status post PEA arrest 08/12/19 -Likely secondary to  aspiration and related hypoxemia -With ROSC after approximately 10 minutes of CPR -Kept in the ICU on ventilatory support until 2019/09/09 -IV antibiotics provided for intrabdominal infection and aspiraiton -followed by palliative care and received assistance by PCCM  Acute, persistent metabolic encephalopathy likely in relation to alcohol related causes -Noted withdrawal as well as suspicious for Warnicke's encephalopathy -patient kept on high-dose IV thiamine -Propofol was used for sedation purposes   Proteus UTI -Status post treatment with Rocephin completed -Foley catheter placed while on ventilator support and then comfort measures.  Abdominal distention secondary to ileus with noted pneumobilia and portal venous gas and signs of ischemic change -Maintain NG tube to suction and kept n.p.o. -patient was kept on IV Zosyn for coverage of intrabdominal infection and aspiration. -Discussed case with general surgery with intervention noted to be futile -Palliative care consultation appreciated, patient changed to be DNR and decision made for terminal extubation and full comfort care. -September 09, 2019 around 6:00 pm he was extubated and peacefully passed away with family at bedside at 11:40PM.  History of PE -Xarelto changed to heparin drip after intubation -O2 sat stable with ventilatory support. -pharmacy to continued managing heparin level and dose until full transition to comfort measures..  History of metastatic colon cancer -Followed by Erik [name] -was on chemotherapy PTA -Prior hx of right hemicolectomy  BPH -Flomax held while NPO -foley cathter was in place given intubation and then comfort measures.  Chronic diarrhea -Most likely associated with previous colonic surgery and hx of alcohol abuse.  COPD -no wheezing appreciated -scattered rhonchi noted on exam  History of CAD -No chest pain reported..   Time: 30 minutes  Signed:  Barton [name]  [facility] 08/15/2019, 7:55 AM

## 2019-08-18 ENCOUNTER — Ambulatory Visit (HOSPITAL_COMMUNITY): Payer: Medicaid Other

## 2019-08-18 ENCOUNTER — Ambulatory Visit (HOSPITAL_COMMUNITY): Payer: Medicaid Other | Admitting: Hematology

## 2019-08-18 ENCOUNTER — Other Ambulatory Visit (HOSPITAL_COMMUNITY): Payer: Medicaid Other

## 2019-08-19 MED FILL — Medication: Qty: 1 | Status: AC

## 2019-08-20 ENCOUNTER — Encounter (HOSPITAL_COMMUNITY): Payer: Medicaid Other

## 2019-09-07 NOTE — Progress Notes (Signed)
Palliative:  HPI: 64 y.o. male  with past medical history of metastatic colon cancer (follows with Dr. Worthy Keeler), COPD, alcohol abuse, psoriasis, CAD, HTN, HLD, h/o SAH/SDH, PTSD admitted on 07/11/2019 from home as neighbor found him confused and in poor condition and self care and called EMS. Treating for UTI and Wernicke's encephalopathy and had acute aspiration event (with underlying concern for ileus) leading to cardiac/PEA arrest and now intubated in ICU. Overall prognosis is poor.   I have discussed care with RN Caryl Pina and Dr. Dyann Kief. I have spoken with both daughters [name] and Caryl Pina (separately) They both understand poor prognosis. Plan developed for one way extubation this evening when both daughters able to be present. Will provide comfort care at this time. Will make preparations for extubation and move OGT to NGT to assist with comfort as he has copious output. Anticipate prognosis poor as hours to 1-2 days likely and anticipate hospital death.   All questions/concerns addressed. Emotional support provided. Updated Caryl Pina RN, Dr. Dyann Kief, Dr. Halford Chessman.   Exam: Alert but not following commands. HR 90s. Lungs with rhonchi and tolerating ventilator with minimal support. Abd distended with copious output via OGT. Toes with multiple areas of skin necrosis.   Plan: - Comfort care.  - Transition OGT to NGT. Transition from propofol to dilaudid infusion for comfort. Orders changed to reflect comfort care and symptom management.  - One way extubation when family gathered at bedside this evening.   [place], NP Palliative Medicine Team Pager [phone] (Please see [email or web address] for schedule) Team Phone [phone]    Greater than 50%  of this time was spent counseling and coordinating care related to the above assessment and plan

## 2019-09-07 NOTE — Care Management (Addendum)
[employer] screening stopped due to terminal extubation and comfort care. Discussed with Jacqualin Combes of [employer] and palliative note faxed.   [facility] notified.

## 2019-09-07 NOTE — Progress Notes (Signed)
PROGRESS NOTE    Erik [name]  ZOX:[id number] DOB: 01-Dec-1955 DOA: 07/25/2019 PCP: Alanson Puls [facility]   Brief Narrative:  Per HPI: Erik [name] a 64 y.o.malewith medical history significant formetastatic lung cancer, COPD, alcohol abuse, psoriasis, coronary artery disease. Brought to the ED via EMS,retractions, patient's neighbor went to check on him and found him to be confused. Neighbors also reported that patient has no family, and is unable to take care of himself. At the time of my evaluation, patient is awakeandalert,answers a few questions appropriately, but otherwise he is unable to remember or is confused. He reports he has been having pain with urination over the past 4 weeks. He reports intermittent vomiting, intermittent loose stools, he has chronic abdominal pain.  He reports that he has been falling, he is unable to tell me what he has been eating.  Apparently in the ED there was dried stool on patient's legs.  ED Course:Temperature 97.6, heart rate 90s 200s, blood pressure systolic 811-914N. O2 sats greater than 95% on room air. WBC 3.5. Sodium 128. Ammonia level 22. INR 1.2. UA shows positive nitrite and large leukocytes with few bacteria. Head CT shows old infarct without acute abnormality. Two-view chest x-ray without acute abnormality. Covid test negative. IV ceftriaxone started, 1 L bolus given. Hospitalist admit for altered mental status, UTI.  -Patient had been treated with Rocephin for total 5-day course for Proteus UTI which was likely causing acute metabolic encephalopathy and he did have improvement in overall alertness, but continued to remain confused and would have periods of tachycardia and anxiety.  This was suspected to be related to alcohol withdrawal and he was started on CIWA protocol with Ativan given as needed.  He continued to remain confused and therefore was thought to have Warnicke's encephalopathy and has been  started on high-dose IV thiamine.  Unfortunately on the morning of 7/6 he developed some aspiration pneumonia and started to become more hypoxemic.  He also had received Ativan earlier in the day per CIWA protocol.   He had a cardiac arrest and underwent CPR for a little under 10 minutes with ROSC.  He was intubated and brought to the ICU for further evaluation and management.  He was started on Zosyn.  He was seen by PCCM as well and CT of the chest as well as abdomen and pelvis were performed on account of significant amounts of feculent material noted on NG suctioning.  He is now noted to have significant ileus with signs of ischemic changes in his abdomen as well as portal venous gas and pneumobilia.  Case was reviewed with radiology as well as Dr. Constance Haw of general surgery with further aggressive care noted to be futile.    Discussions were had with his next of kin which is his daughter Erik [name] who understands that he has a grave prognosis and decision was made to keep him DNR.  Palliative care consulted with death likely eminent.  Plan is for one-way installation later today with full compression to comfort care at the moment.  Assessment & Plan:   Active Problems:   Acute metabolic encephalopathy   Cardiac arrest (HCC)   Goals of care, counseling/discussion   Palliative care by specialist   Acute hypoxemic respiratory failure secondary to aspiration pneumonitis -Appreciate PCCM evaluation and management -Continue on ventilator support -Zosyn ordered for aspiration coverage -Sputum cultures ordered and pending  Status post PEA arrest 7/6 -Likely secondary to aspiration and related hypoxemia -With ROSC after approximately 10  minutes of CPR -Currently in ICU on ventilatory support -continue current antibiotics -follow rec's by palliative care -appreciate assistance by PCCM  Acute, persistent metabolic encephalopathy likely in relation to alcohol related causes -Noted withdrawal as  well as suspicion of Warnicke's encephalopathy -Continue on high-dose IV thiamine -Propofol for sedation provided  Proteus UTI -Status post treatment with Rocephin, completed -Foley catheter placed while on ventilator support  Abdominal distention secondary to ileus with noted pneumobilia and portal venous gas and signs of ischemic change -Maintain NG tube to suction and keep n.p.o. -Continue IV Zosyn for coverage currently -Discussed case with general surgery with intervention noted to be futile -Palliative care consultation appreciated -continue current care without escalation; patient is DNR. Most likely one way extubation in the next 12 hours or so.  History of PE -Xarelto changed to heparin drip after intubation -O2 sat stable with ventilatory support. -pharmacy to continue managing heparin level and dose.  History of metastatic colon cancer -Followed by Dr. Delton Coombes -was on chemotherapy PTA -Prior hx of right hemicolectomy  BPH -Holding Flomax -foley cathter in place currently  Chronic diarrhea -Most likely associated with previous colonic surgery and alcohol abuse.  COPD -Currently intubated and mechanically ventilated -no wheezing   History of CAD -No chest pain.  DVT prophylaxis: Heparin drip Code Status: DNR Family Communication: Discussed with daughter Erik [name]  Disposition Plan:   Status is: Inpatient  Dispo: The patient is from: Home              Anticipated d/c is to: will most likely expired in the hospital.              Anticipated d/c date is: to be determine              Patient currently not medically stable for discharge; very poor prognosis with anticipated hospital death.  Remains intubated, sedated, currently ventilated.  Continue IV antibiotics.  Follow-up for discussion with palliative care and family for advance directives. Reaching point of one way extubation; hopefully this afternoon with full transition to full comfort  care..   Consultants:   PCCM  Discussed with general surgery  Palliative care  Procedures:   Intubation 7/6   Antimicrobials:  Anti-infectives (From admission, onward)   Start     Dose/Rate Route Frequency Ordered Stop   08/12/19 2200  piperacillin-tazobactam (ZOSYN) IVPB 3.375 g  Status:  Discontinued        3.375 g 12.5 mL/hr over 240 Minutes Intravenous Every 8 hours 08/12/19 1307 Aug 24, 2019 1109   08/12/19 1330  piperacillin-tazobactam (ZOSYN) IVPB 3.375 g        3.375 g 100 mL/hr over 30 Minutes Intravenous  Once 08/12/19 1306 08/12/19 1420   08/08/19 0000  cefdinir (OMNICEF) 300 MG capsule        300 mg Oral 2 times daily 08/08/19 1121 08/13/19 2359   08/07/19 2000  cefTRIAXone (ROCEPHIN) 1 g in sodium chloride 0.9 % 100 mL IVPB        1 g 200 mL/hr over 30 Minutes Intravenous Every 24 hours 07/22/2019 2133 08/11/19 2100   08/03/2019 1900  cefTRIAXone (ROCEPHIN) 1 g in sodium chloride 0.9 % 100 mL IVPB        1 g 200 mL/hr over 30 Minutes Intravenous  Once 08/04/2019 1850 07/27/2019 2055      Subjective: Low-grade temperature appreciated overnight; patient able to open eyes on command while receiving sedation holiday reporting some abdominal discomfort on palpation.  Blood pressure remains soft;  sinus tachycardia appreciated.  Objective: Vitals:   09/01/19 1500 September 01, 2019 1530 2019-09-01 1600 01-Sep-2019 1631  BP: 97/71 (!) 114/92 107/71 101/72  Pulse: (!) 104 (!) 105 (!) 103 (!) 101  Resp: (!) 24 (!) 24 (!) 24 (!) 24  Temp:    97.6 F (36.4 C)  TempSrc:    Axillary  SpO2: 92% 92% 92% 92%  Weight:      Height:        Intake/Output Summary (Last 24 hours) at 01-Sep-2019 1704 Last data filed at 2019/09/01 1502 Gross per 24 hour  Intake 1620.24 ml  Output 200 ml  Net 1420.24 ml   Filed Weights   07/27/2019 2145 08/13/19 0500 09/01/2019 0500  Weight: 88.7 kg 87.9 kg 89.3 kg    Examination: General exam: Sedated, mechanically ventilated; able to open eyes on commands and  communicate abdominal discomfort.  NG tube in place brownish output appreciated. Respiratory system: No wheezing, no crackles. Cardiovascular system: No tachycardia, no rubs, no gallops. Gastrointestinal system: Abdomen is distended, tympanic, no bowel sounds appreciated.  Patient reporting discomfort on palpation. Central nervous system: Moving 4 limbs spontaneously; able to follow simple commands while still receiving some sedation for ventilatory management. Extremities: No cyanosis or clubbing. Skin: No petechiae. Psychiatry: Unable to fully assess in the setting of sedation and mechanical ventilation.  Data Reviewed: I have personally reviewed following labs and imaging studies  CBC: Recent Labs  Lab 08/08/19 0904 08/10/19 0524 08/13/19 0351  WBC 2.9* 3.7* 13.2*  NEUTROABS  --   --  11.0*  HGB 12.1* 12.0* 12.9*  HCT 38.0* 37.7* 41.0  MCV 98.2 97.9 98.1  PLT 133* 157 161   Basic Metabolic Panel: Recent Labs  Lab 08/08/19 0904 08/10/19 0524 08/11/19 0437 08/13/19 0351  NA 134* 134* 135 135  K 3.2* 3.4* 4.0 4.7  CL 95* 93* 94* 91*  CO2 27 29 27 27   GLUCOSE 126* 131* 140* 122*  BUN 8 6* 8 25*  CREATININE 0.75 0.76 0.82 1.73*  CALCIUM 9.0 9.3 9.7 8.7*  MG  --  1.5* 2.0 1.9  PHOS  --   --   --  4.5   GFR: Estimated Creatinine Clearance: 52.1 mL/min (A) (by C-G formula based on SCr of 1.73 mg/dL (H)).   Liver Function Tests: Recent Labs  Lab 08/13/19 0351  AST 56*  ALT 101*  ALKPHOS 179*  BILITOT 0.6  PROT 6.7  ALBUMIN 2.8*   CBG: Recent Labs  Lab 08/13/19 2136 08/13/19 2354 09-01-2019 0346 September 01, 2019 0727 09/01/19 1112  GLUCAP 99 104* 98 110* 97   Lipid Profile: Recent Labs    08/13/19 0351 09-01-19 0352  TRIG 166* 317*   Sepsis Labs: No results for input(s): PROCALCITON, LATICACIDVEN in the last 168 hours.  Recent Results (from the past 240 hour(s))  SARS Coronavirus 2 by RT PCR (hospital order, performed in [facility] hospital lab)  Nasopharyngeal Nasopharyngeal Swab     Status: None   Collection Time: 07/25/2019  5:34 PM   Specimen: Nasopharyngeal Swab  Result Value Ref Range Status   SARS Coronavirus 2 NEGATIVE NEGATIVE Final    Comment: (NOTE) SARS-CoV-2 target nucleic acids are NOT DETECTED.  The SARS-CoV-2 RNA is generally detectable in upper and lower respiratory specimens during the acute phase of infection. The lowest concentration of SARS-CoV-2 viral copies this assay can detect is 250 copies / mL. A negative result does not preclude SARS-CoV-2 infection and should not be used as the sole basis for  treatment or other patient management decisions.  A negative result may occur with improper specimen collection / handling, submission of specimen other than nasopharyngeal swab, presence of viral mutation(s) within the areas targeted by this assay, and inadequate number of viral copies (<250 copies / mL). A negative result must be combined with clinical observations, patient history, and epidemiological information.  Fact Sheet for Patients:   StrictlyIdeas.no  Fact Sheet for Healthcare Providers: BankingDealers.co.za  This test is not yet approved or  cleared by the [place] FDA and has been authorized for detection and/or diagnosis of SARS-CoV-2 by FDA under an Emergency Use Authorization (EUA).  This EUA will remain in effect (meaning this test can be used) for the duration of the COVID-19 declaration under Section 564(b)(1) of the Act, 21 U.S.C. section 360bbb-3(b)(1), unless the authorization is terminated or revoked sooner.  Performed at [facility], [address], [place], [place] [address]   Urine culture     Status: Abnormal   Collection Time: 07/12/2019  5:49 PM   Specimen: Urine, Random  Result Value Ref Range Status   Specimen Description   Final    URINE, RANDOM Performed at [facility], [address], [place], [place] [address]     Special Requests   Final    NONE Performed at [facility], [address], [place], [place] [address]    Culture >=100,000 COLONIES/mL PROTEUS MIRABILIS (A)  Final   Report Status 08/09/2019 FINAL  Final   Organism ID, Bacteria PROTEUS MIRABILIS (A)  Final      Susceptibility   Proteus mirabilis - MIC*    AMPICILLIN <=2 SENSITIVE Sensitive     CEFAZOLIN <=4 SENSITIVE Sensitive     CEFTRIAXONE <=0.25 SENSITIVE Sensitive     CIPROFLOXACIN <=0.25 SENSITIVE Sensitive     GENTAMICIN <=1 SENSITIVE Sensitive     IMIPENEM 2 SENSITIVE Sensitive     NITROFURANTOIN 128 RESISTANT Resistant     TRIMETH/SULFA <=20 SENSITIVE Sensitive     AMPICILLIN/SULBACTAM <=2 SENSITIVE Sensitive     PIP/TAZO <=4 SENSITIVE Sensitive     * >=100,000 COLONIES/mL PROTEUS MIRABILIS  Culture, blood (routine x 2)     Status: None   Collection Time: 07/17/2019  7:04 PM   Specimen: BLOOD RIGHT FOREARM  Result Value Ref Range Status   Specimen Description BLOOD RIGHT FOREARM  Final   Special Requests   Final    BOTTLES DRAWN AEROBIC AND ANAEROBIC Blood Culture adequate volume   Culture   Final    NO GROWTH 5 DAYS Performed at [facility], [address] [place], [place] [address]    Report Status 08/11/2019 FINAL  Final  Culture, blood (routine x 2)     Status: None   Collection Time: 07/10/2019  7:15 PM   Specimen: BLOOD LEFT HAND  Result Value Ref Range Status   Specimen Description BLOOD LEFT HAND  Final   Special Requests   Final    BOTTLES DRAWN AEROBIC AND ANAEROBIC Blood Culture adequate volume   Culture   Final    NO GROWTH 5 DAYS Performed at [facility], [address] [place], [place] [address]    Report Status 08/11/2019 FINAL  Final  Culture, respiratory (non-expectorated)     Status: None   Collection Time: 08/12/19  1:58 PM   Specimen: Endotracheal; Respiratory  Result Value Ref Range Status   Specimen Description   Final    ENDOTRACHEAL Performed at [facility], [address]  [place], [place]  [address]    Special Requests   Final    NONE Performed at [facility], [address] [place], [place] [address]    Gram Stain   Final    NO WBC SEEN ABUNDANT YEAST MODERATE GRAM POSITIVE RODS FEW GRAM NEGATIVE RODS Performed at [facility], [place] [address], [place], [place] [address]    Culture MODERATE CANDIDA TROPICALIS  Final   Report Status August 16, 2019 FINAL  Final     Radiology Studies: DG CHEST PORT 1 VIEW  Result Date: 08-16-19 CLINICAL DATA:  Respiratory failure EXAM: PORTABLE CHEST 1 VIEW COMPARISON:  08/13/2019 FINDINGS: Endotracheal tube is seen 4.5 cm above the carina. Nasogastric tube has been repositioned and I suspect it is coiled within the hypopharynx. Left subclavian chest port is unchanged with its tip at the superior cavoatrial junction. Left basilar collapse and consolidation is unchanged. Mild resultant left-sided volume loss. Tiny left pleural effusion is suspected. No pneumothorax. Cardiac size within normal limits. IMPRESSION: Nasogastric tube coiled within the hypopharynx. Endotracheal tube unchanged. Stable left basilar collapse and consolidation. These results will be called to the ordering clinician or representative by the Radiologist Assistant, and communication documented in the PACS or [employer]. Electronically Signed   By: Fidela Salisbury MD   On: 16-Aug-2019 15:45   DG Chest Port 1 View  Result Date: 08/13/2019 CLINICAL DATA:  Respiratory failure EXAM: PORTABLE CHEST 1 VIEW COMPARISON:  08/12/2019 FINDINGS: Endotracheal tube tip is at the clavicular heads. Enteric tube with tip reaching the stomach. Left port with tip at the SVC. Worsening inflation and increased streaky density at the bases. No edema, effusion, or pneumothorax. Normal heart size. IMPRESSION: 1. Stable hardware positioning. 2. Worsening inflation and infiltrates. Electronically Signed   By: Monte Fantasia M.D.   On: 08/13/2019 06:24   DG Abd Portable 1V  Result  Date: 08/13/2019 CLINICAL DATA:  Respiratory failure and ileus. Code blue earlier today with intubation. Metastatic colon cancer. EXAM: PORTABLE ABDOMEN - 1 VIEW COMPARISON:  CT abdomen from 08/12/2019 FINDINGS: Is gastric tube tip is in the stomach body. Suspected left retro diaphragmatic airspace opacity. Dilated loops of small bowel up to about 6.5 cm throughout the abdomen. The pneumatosis shown on CT scan yesterday involving the small bowel is not highly apparent on radiography, but sensitivity is expected to be much higher on CT and this lack of visualization on today's radiograph does not ensure resolution of the prior small bowel pneumatosis. I not see obvious signs of free intraperitoneal gas, with the understanding that today supine abdominal radiograph has a lower sensitivity compared to decubitus projections. Contrast medium observed in the urinary bladder. IMPRESSION: 1. Continued dilated loops of small bowel, up to 6.5 cm. 2. Left left lower lobe airspace opacity. 3. Nasogastric tube tip: Stomach body. Electronically Signed   By: Van Clines M.D.   On: 08/13/2019 06:37    Scheduled Meds: . artificial tears   Both Eyes BID  . Chlorhexidine Gluconate Cloth  6 each Topical Daily  . glycopyrrolate  0.4 mg Intravenous Q4H  . mouth rinse  15 mL Mouth Rinse 10 times per day  . pantoprazole (PROTONIX) IV  40 mg Intravenous Daily   Continuous Infusions: . sodium chloride 100 mL/hr at 08/16/19 0550  . HYDROmorphone    . propofol (DIPRIVAN) infusion 30 mcg/kg/min (08/16/19 1502)     LOS: 8 days    Critical care time: 35 minutes    Barton Dubois, MD [facility]  If  7PM-7AM, please contact night-coverage [email or web address] September 02, 2019, 5:04 PM

## 2019-09-07 NOTE — Progress Notes (Signed)
Nutrition Brief Note  Chart reviewed. Pt now transitioning to comfort care. Per PCT notes, pt with very poor prognosis and plan for one way extubation with family present this evening.  No further nutrition interventions warranted at this time.  Please re-consult as needed.   Loistine Chance, RD, LDN, [place] Registered Dietitian II Certified Diabetes Care and Education Specialist Please refer to [facility] for RD and/or RD on-call/weekend/after hours pager

## 2019-09-07 NOTE — Progress Notes (Signed)
ANTICOAGULATION CONSULT NOTE -   Pharmacy Consult for heparin IV Indication: history of PE- currently NPO  No Known Allergies  Patient Measurements: Height: 6\' 2"  (188 cm) Weight: 89.3 kg (196 lb 13.9 oz) IBW/kg (Calculated) : 82.2 Heparin Dosing Weight: 88 kg  Vital Signs: Temp: 99 F (37.2 C) (07/08 0800) Temp Source: Bladder (07/08 0724) BP: 107/71 (07/08 0800) Pulse Rate: 93 (07/08 0800)  Labs: Recent Labs    08/12/19 2212 08/13/19 0351 08/13/19 0942 08/13/19 2106 04-Sep-2019 0352  HGB  --  12.9*  --   --   --   HCT  --  41.0  --   --   --   PLT  --  199  --   --   --   APTT   < >  --  >200* 81* 71*  HEPARINUNFRC  --   --  >2.20*  --  1.06*  CREATININE  --  1.73*  --   --   --    < > = values in this interval not displayed.    Estimated Creatinine Clearance: 52.1 mL/min (A) (by C-G formula based on SCr of 1.73 mg/dL (H)).  Assessment: Pharmacy consulted to dose heparin in patient with history of PE.  Patient is currently NPO and due for CT abd/pelvis.  Patient on Xarelto prior to initiation with last dose given 7/5 at 1530.  Will need to monitor based on aPTT until correlation with heparin levels.  09-04-2019 1030 update: APTT: 71 seconds, within goal range on heparin at 700 units/hr Heparin level: 1.06 IU.mL-->remains elevated for now CBC: Hb 12.9 on 7/7    Plates: 199 RN reports no bleeding complications or issues with infusion site   Goal of Therapy:  Heparin level 0.3-0.7 units/ml aPTT 66-102 seconds Monitor platelets by anticoagulation protocol: Yes   Plan:  Continue heparin infusion at 700 units/hr  Daily aPTT,   heparin level and CBC Monitor for s/s of bleeding.  Despina Pole, Pharm. D. Clinical Pharmacist 2019/09/04 10:43 AM

## 2019-09-07 NOTE — Progress Notes (Signed)
Patient terminally extubated around 1815. Both daughters, Erik [name] and Erik [name], at bedside as well as patient's pastor, Richardson Landry, and Megan's husband. Dilaudid drip started prior to extubation and titrated per orders.

## 2019-09-07 NOTE — Progress Notes (Signed)
Reviewed note from palliative care.  Pt is DNR with plan for terminal extubation this evening.  Please call if further assistance needed from PCCM.  Chesley Mires, MD [place] Pager - [phone] 09/01/19, 2:18 PM

## 2019-09-07 NOTE — Progress Notes (Signed)
Pt went asystole at 2334 with both daughters, a son in law and ex-girlfriend at the bedside. Wasted 30 mL of dilaudid into steri jug with [employer] as witness.

## 2019-09-07 NOTE — Procedures (Signed)
**Note De-Identified Bret Vanessen Obfuscation** Extubation Procedure Note  Patient Details:   Name: BOYKIN BAETZ DOB: 30-Dec-1955 MRN: [id number]   Airway Documentation:    Vent end date: 28-Aug-2019 Vent end time: 1800   Evaluation  O2 sats: transiently fell during during procedure Complications: No apparent complications Patient did tolerate procedure well. Bilateral Breath Sounds: Rhonchi   No + leak, no stridor noted Adaria Hole, Penni Bombard 08-28-19, 6:52 PM

## 2019-09-07 DEATH — deceased

## 2019-09-15 ENCOUNTER — Ambulatory Visit (HOSPITAL_COMMUNITY): Payer: Medicaid Other | Admitting: Hematology

## 2019-09-15 ENCOUNTER — Ambulatory Visit (HOSPITAL_COMMUNITY): Payer: Medicaid Other

## 2019-09-15 ENCOUNTER — Other Ambulatory Visit (HOSPITAL_COMMUNITY): Payer: Medicaid Other

## 2019-09-17 ENCOUNTER — Encounter (HOSPITAL_COMMUNITY): Payer: Medicaid Other

## 2020-08-15 IMAGING — CT CT BIOPSY
1 of 3 series · 13 of 32 positions shown, 18 images · non-contrast
Comparison: PET-CT-11/19/2017;

INDICATION: History of colon cancer, now with ill-defined nodularity within the
left upper abdomen and pelvis worrisome for metastatic disease.
Please perform image guided biopsy.

EXAM:
ATTEMPTED CT AND ULTRASOUND-GUIDED BIOPSY OF LEFT UPPER ABDOMINAL
OMENTAL NODULE

[Series 2: i-spiral 5.0 b40f · axial · 0.98mm/px · z∈[+877,+1213]mm · 13 of 108 slices shown, 18 images]
[im 6/108  soft-tissue]
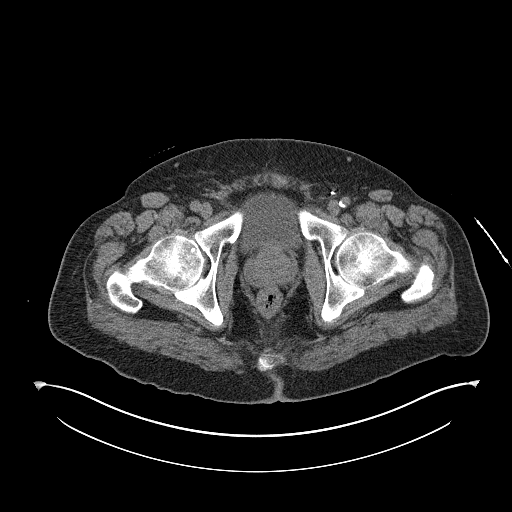
[im 6/108  bone]
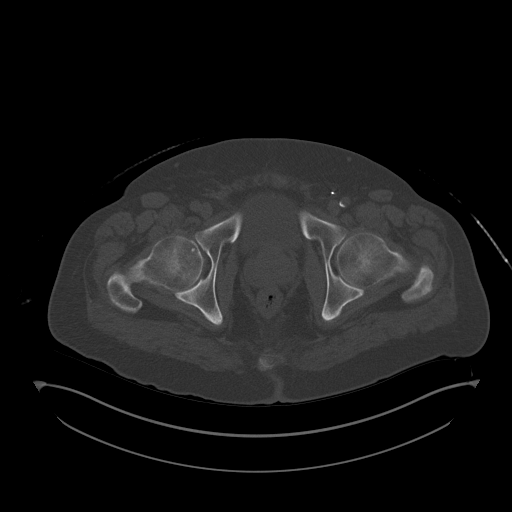
[im 17/108  soft-tissue]
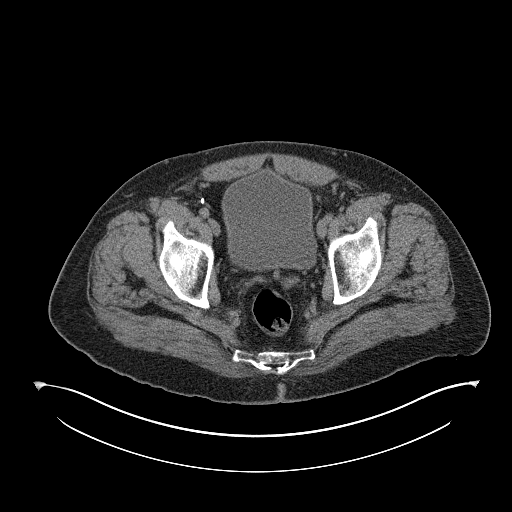
[im 23/108  soft-tissue]
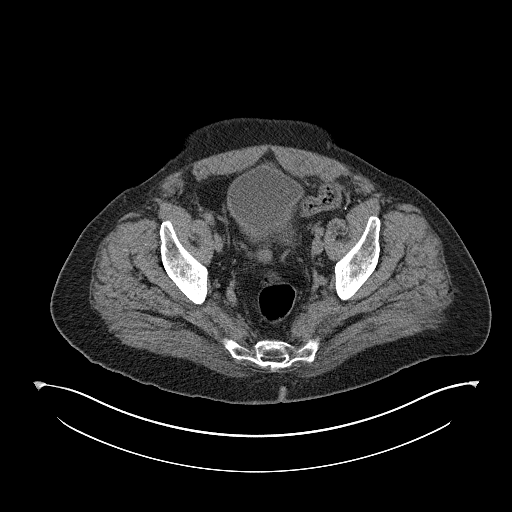
[im 34/108  soft-tissue]
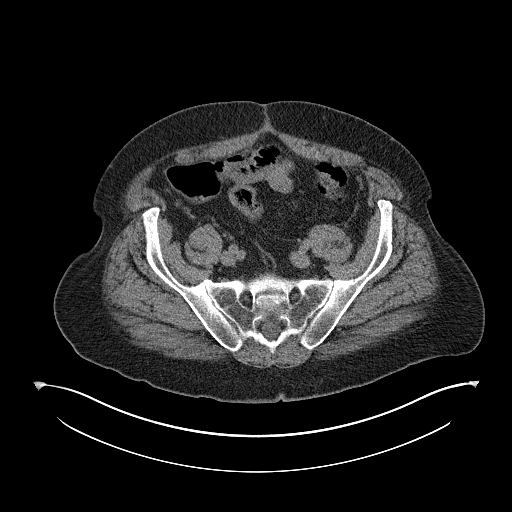
[im 40/108  soft-tissue]
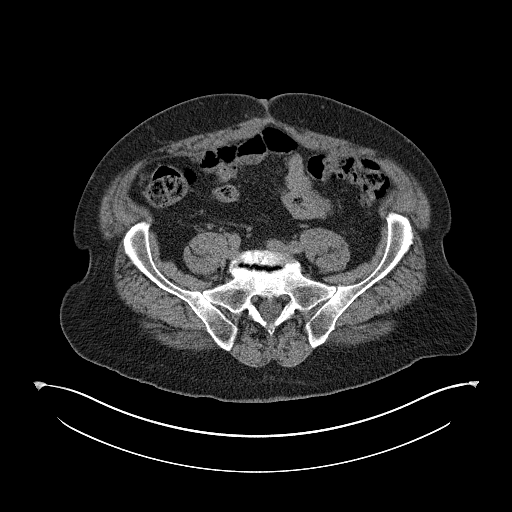
[im 51/108  soft-tissue]
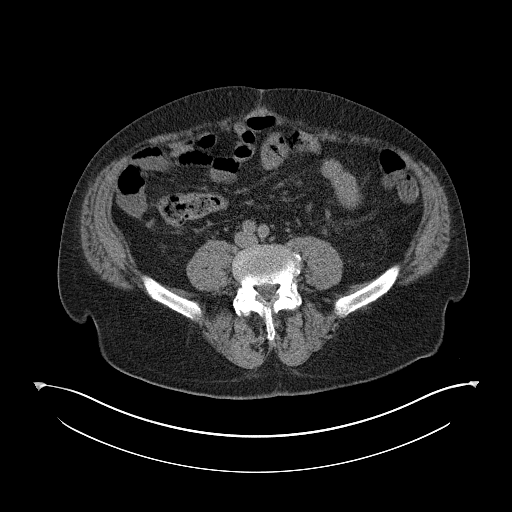
[im 57/108  soft-tissue]
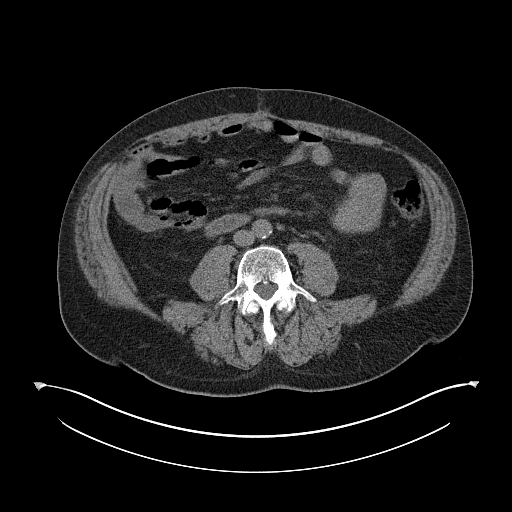
[im 68/108  soft-tissue]
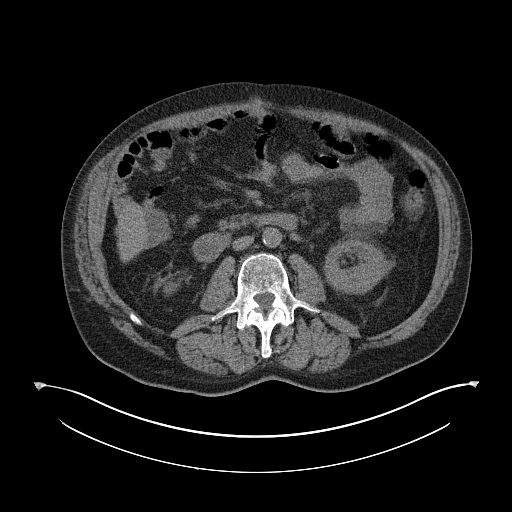
[im 74/108  soft-tissue]
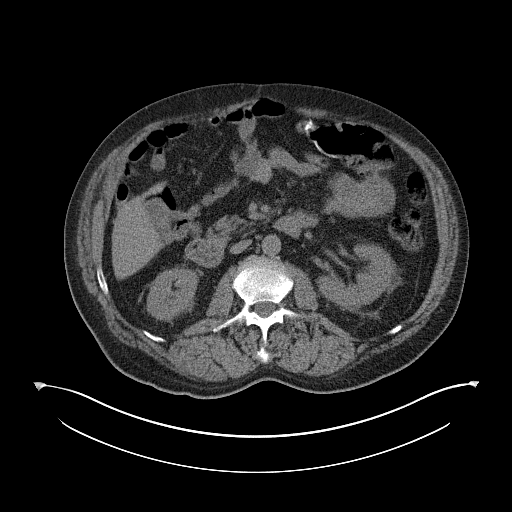
[im 74/108  bone]
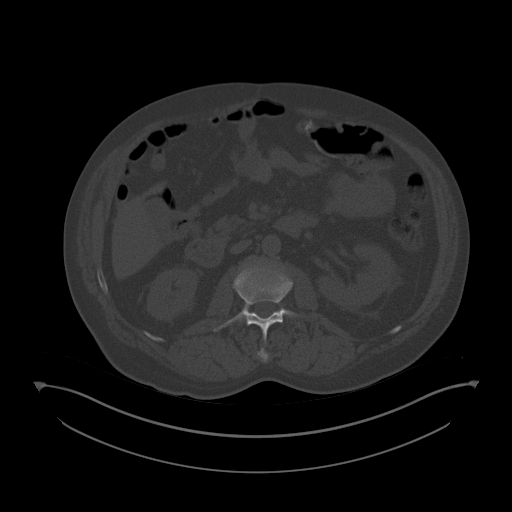
[im 85/108  soft-tissue]
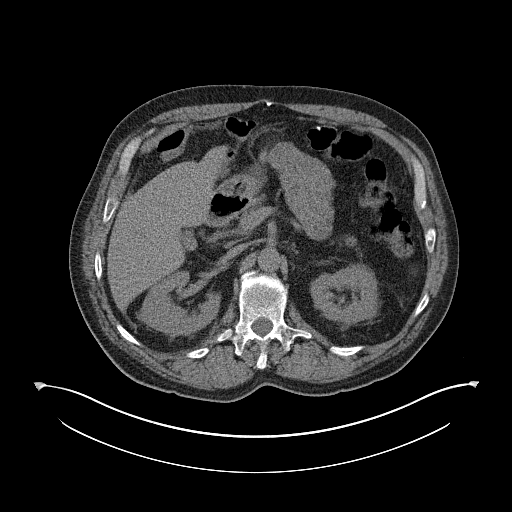
[im 85/108  lung]
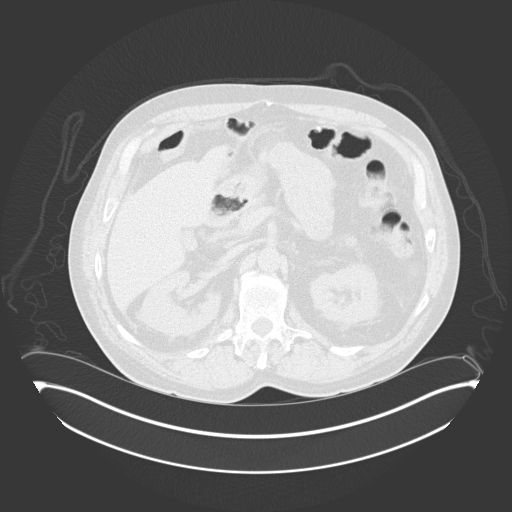
[im 91/108  soft-tissue]
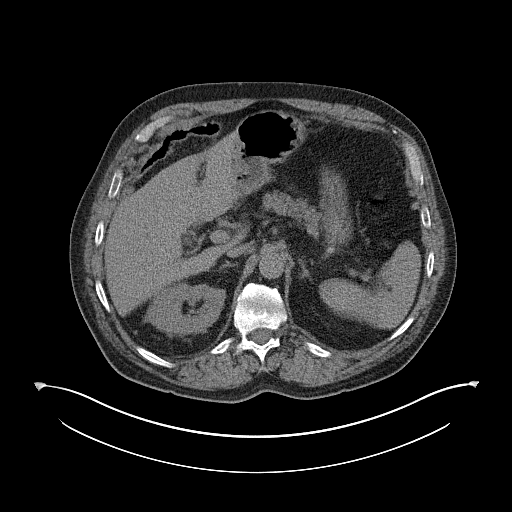
[im 91/108  lung]
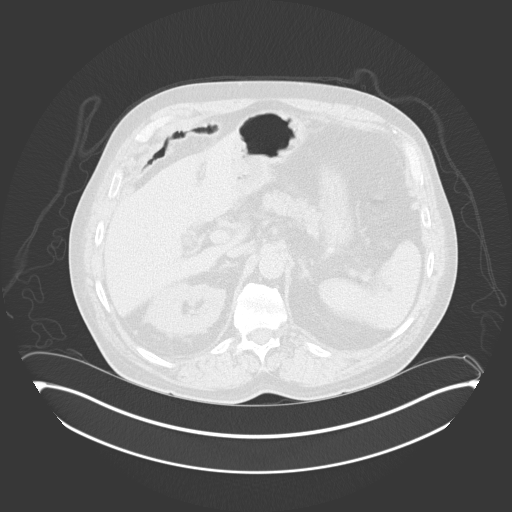
[im 96/108  lung]
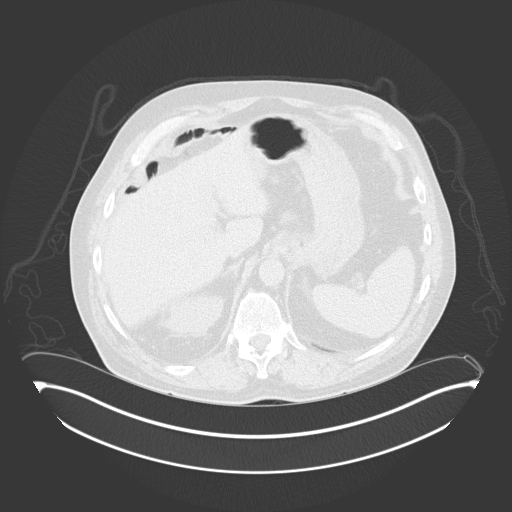
[im 102/108  soft-tissue]
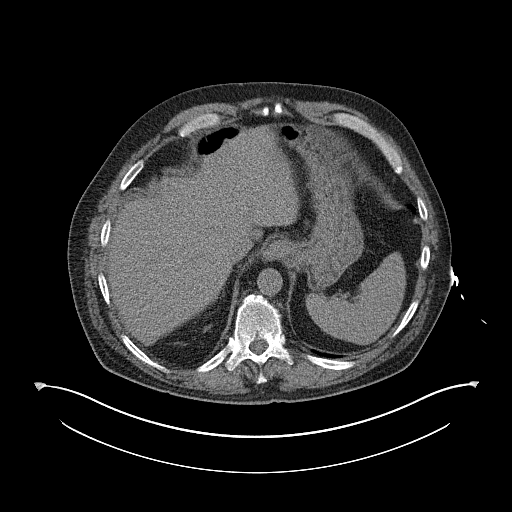
[im 102/108  lung]
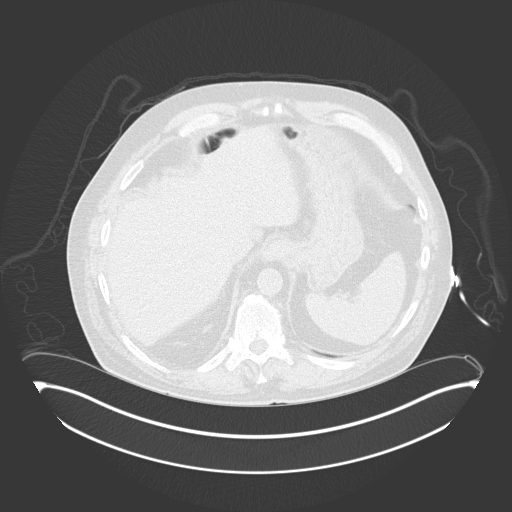

[13 of 32 positions shown; findings below may reference images not displayed]

CT the chest, abdomen and
pelvis-10/23/2017

MEDICATIONS:
None.

ANESTHESIA/SEDATION:
Fentanyl 50 mcg IV; Versed 2 mg IV

Sedation time: 15 minutes; The patient was continuously monitored
during the procedure by the interventional radiology nurse under my
direct supervision.

CONTRAST:  None.

COMPLICATIONS:
None immediate.

PROCEDURE:
Informed consent was obtained from the patient following an
explanation of the procedure, risks, benefits and alternatives. A
time out was performed prior to the initiation of the procedure.

The patient was positioned supine on the CT table and a limited CT
was performed for procedural planning demonstrating mild ill-defined
nodularity within ventral aspect the left upper abdomen with index
apparent omental nodules measuring 1.4 x 0.7 cm (image 16, series 2
additional omental nodule measuring 0.9 x 0.6 cm (image 23, series
2). Re demonstrated ill-defined macrolobulated apparent omental
nodule within midline of lower pelvis measuring approximately 2.3 x
1.9 cm.

Dominant apparent omental nodule within midline of the pelvis was
deemed not accessible for biopsy given lack of percutaneous window.

While the omental nodules within the ventral aspect the left upper
abdomen are small and ill-defined, attempts were made to localize
both of these dominant lesions under ultrasound guidance with CT
confirmation with 22 gauge spinal needles however ultimately neither
nodule was confidently targeted and as such, no biopsy was
performed.

The spinal needle was removed and superficial hemostasis was
achieved with manual compression. A dressing was applied. The
patient tolerated the procedure well without immediate
postprocedural complication.
IMPRESSION: 1. Dominant ill-defined nodule with midline of the pelvis is
presently not accessible to percutaneous biopsy given lack of
percutaneous window.
2. Attempted though ultimately unsuccessful image guided biopsy of
ID ill-defined omental nodules within the ventral aspect of the left
upper abdomen.

## 2021-10-28 IMAGING — MR MR HEAD WO/W CM
4 of 6 series · 16 of 48 positions shown · IV contrast (gadavist)
Comparison: Head CT 01/25/2019

CLINICAL DATA: Multiple recent falls. History of colon cancer,
alcohol abuse, and remote trauma.

EXAM:
MRI HEAD WITHOUT AND WITH CONTRAST
TECHNIQUE: Multiplanar, multiecho pulse sequences of the brain and surrounding
structures were obtained without and with intravenous contrast.
CONTRAST:  10mL GADAVIST GADOBUTROL 1 MMOL/ML IV SOLN

[Series 9: FLAIR · axial · 3.0mm · 0.35mm/px · z∈[-40,+76]mm · 7 of 47 slices shown]
[im 1/47]
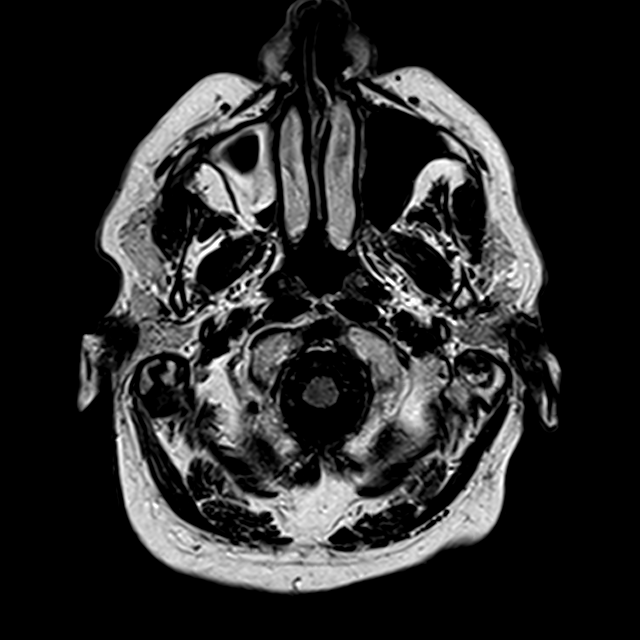
[im 6/47]
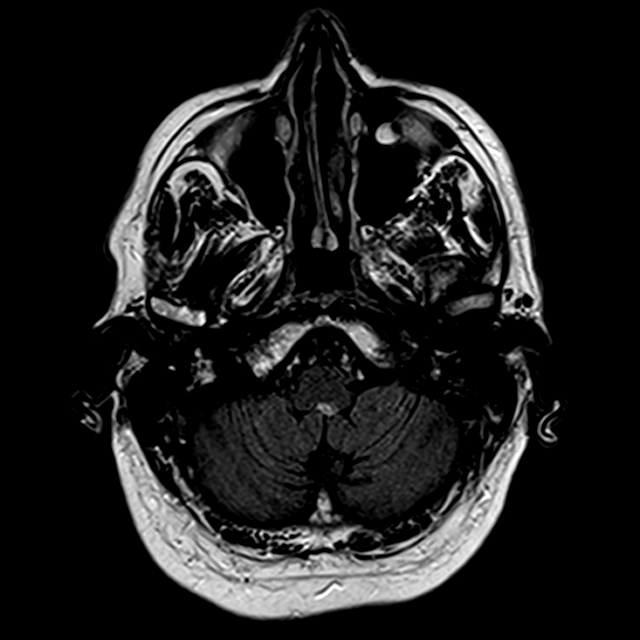
[im 16/47]
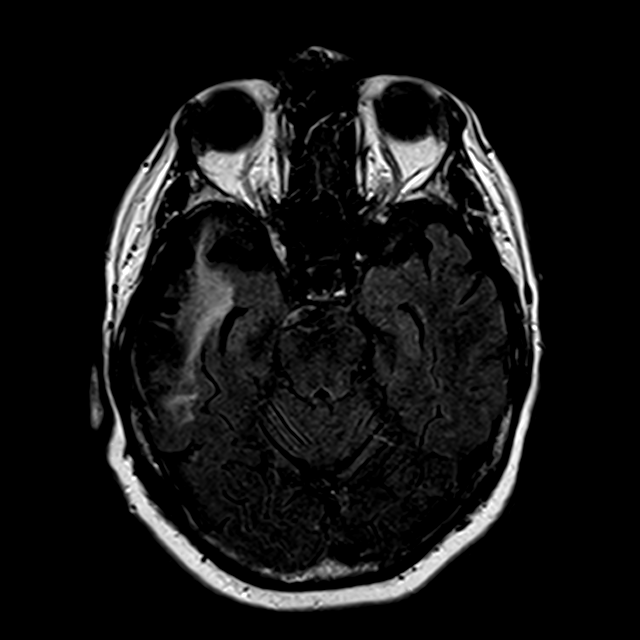
[im 21/47]
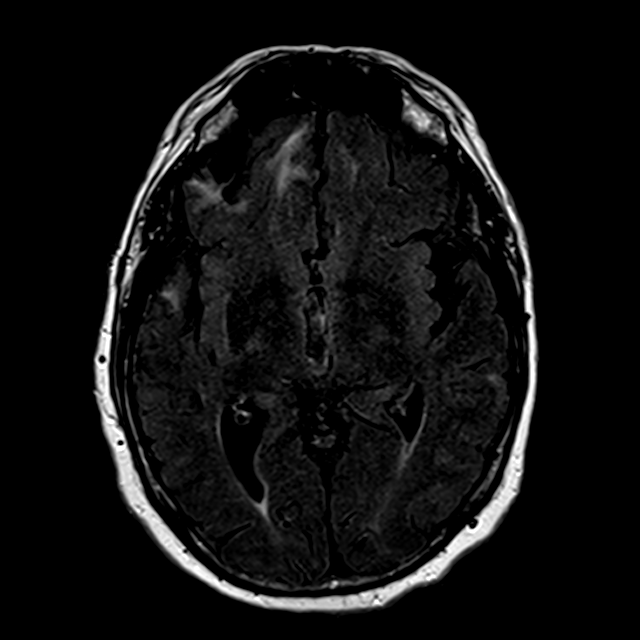
[im 26/47]
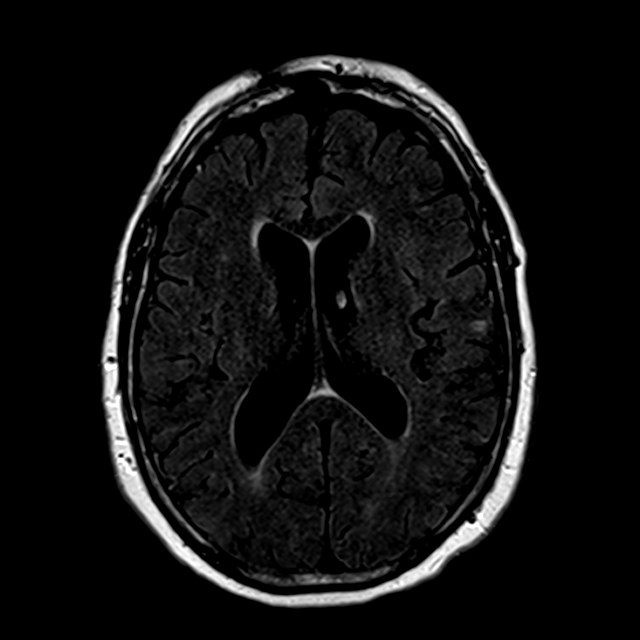
[im 31/47]
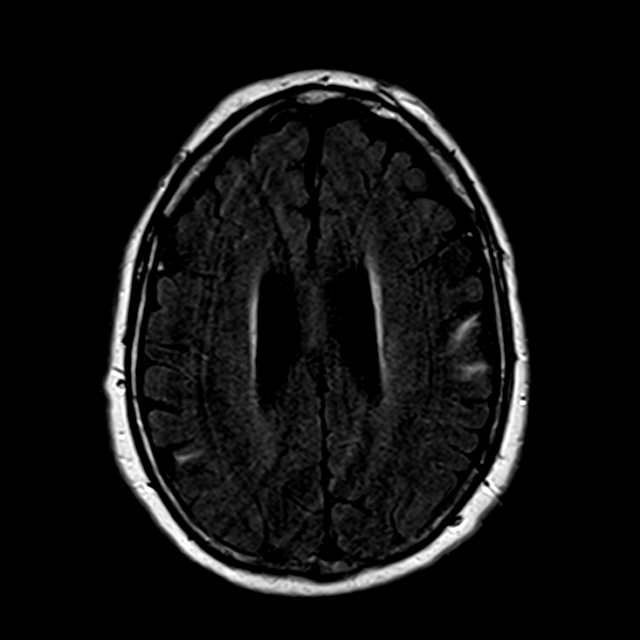
[im 41/47]
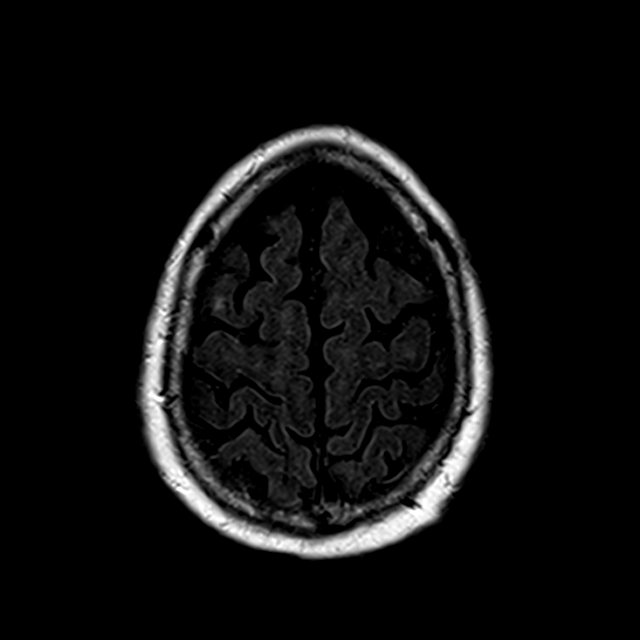

[Series 12: T1 post-contrast · axial · 2.0mm · 0.45mm/px · z∈[-34,+82]mm · 3 of 82 slices shown (1 of 3)]
[im 11/82]
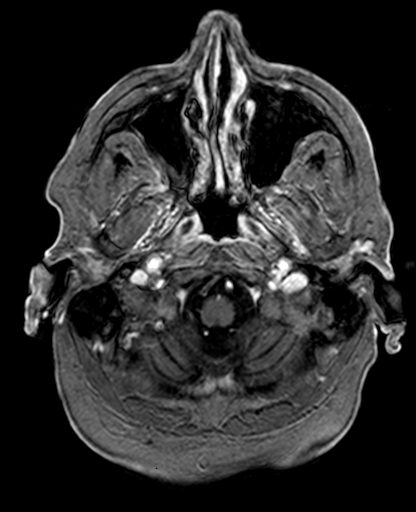
[im 41/82]
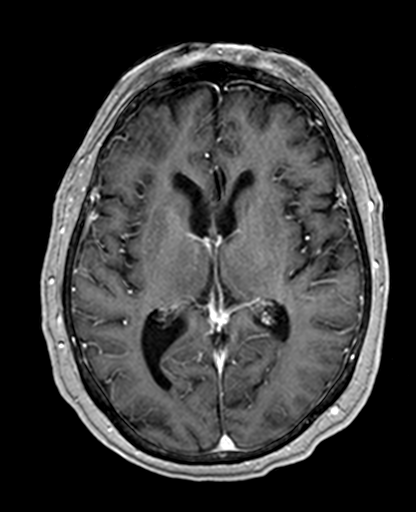
[im 71/82]
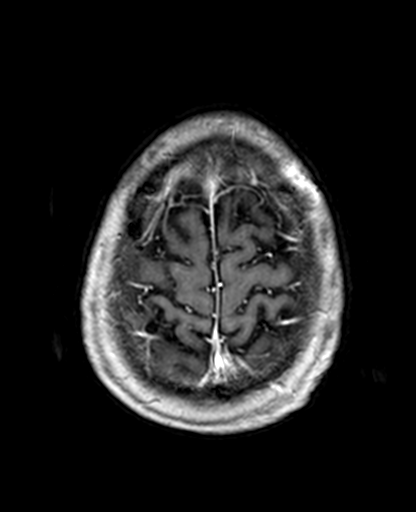

[Series 13: T1 post-contrast · coronal · 5.0mm · 0.38mm/px · 3 of 28 slices shown (2 of 3)]
[im 6/28]
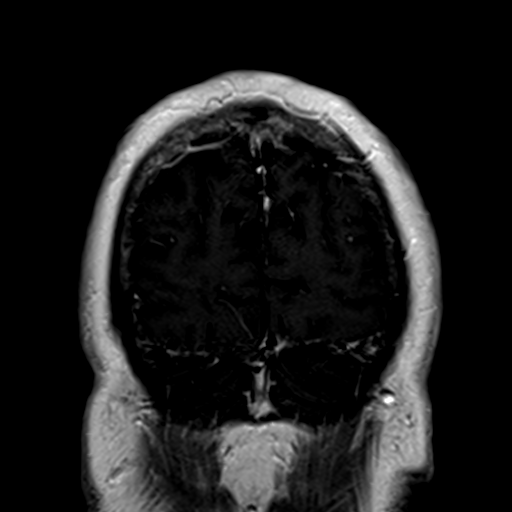
[im 17/28]
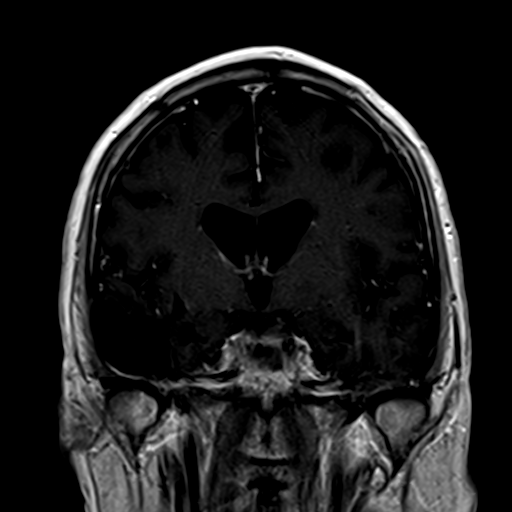
[im 28/28]
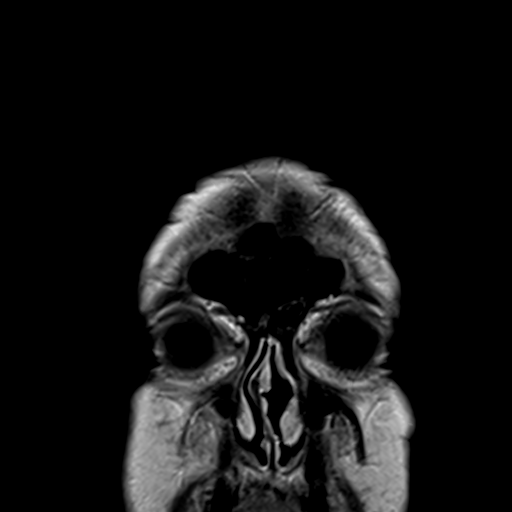

[Series 14: T1 post-contrast · sagittal · 5.0mm · 0.43mm/px · 3 of 24 slices shown (3 of 3)]
[im 1/24]
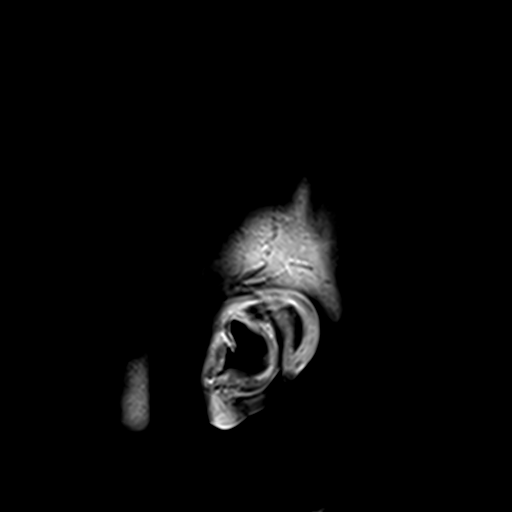
[im 12/24]
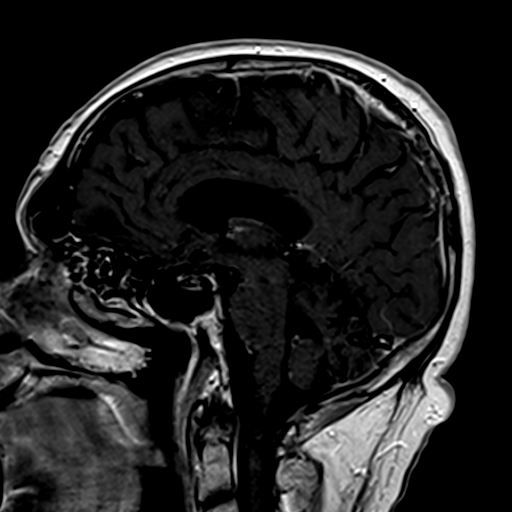
[im 24/24]
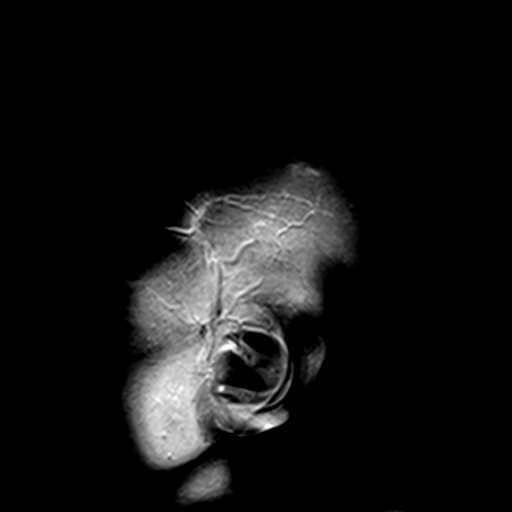

[16 of 48 positions shown; findings below may reference images not displayed]

FINDINGS: Brain: Multifocal encephalomalacia is again seen including a large
region of encephalomalacia in the anterior and lateral right
temporal lobe and smaller regions in the inferior right greater than
left frontal lobes, lateral left temporal lobe, and lateral frontal
and parietal lobes bilaterally. There is associated hemosiderin
staining. No acute infarct, mass, midline shift, or extra-axial
fluid collection is identified. There is mildly advanced cerebral
and moderately advanced cerebellar atrophy. No abnormal enhancement
is identified.

Vascular: Major intracranial vascular flow voids are preserved.

Skull and upper cervical spine: Unremarkable bone marrow signal.

Sinuses/Orbits: Unremarkable orbits. Mild right maxillary sinus
mucosal thickening. Small left maxillary sinus mucous retention
cysts. Trace bilateral mastoid fluid.

Other: None.
IMPRESSION: 1. No acute intracranial abnormality or evidence of metastases.
2. Multifocal encephalomalacia consistent with remote trauma.
3. Moderately advanced cerebellar atrophy.
